# Patient Record
Sex: Female | Born: 1955
Health system: Southern US, Community
[De-identification: ages and names within clinical notes are randomized; demographics above are authoritative.]

## PROBLEM LIST (undated history)

## (undated) DIAGNOSIS — J45909 Unspecified asthma, uncomplicated: Secondary | ICD-10-CM

## (undated) DIAGNOSIS — E785 Hyperlipidemia, unspecified: Secondary | ICD-10-CM

## (undated) DIAGNOSIS — M3500 Sicca syndrome, unspecified: Secondary | ICD-10-CM

## (undated) DIAGNOSIS — E119 Type 2 diabetes mellitus without complications: Secondary | ICD-10-CM

## (undated) DIAGNOSIS — D649 Anemia, unspecified: Secondary | ICD-10-CM

## (undated) DIAGNOSIS — J189 Pneumonia, unspecified organism: Secondary | ICD-10-CM

## (undated) DIAGNOSIS — I350 Nonrheumatic aortic (valve) stenosis: Secondary | ICD-10-CM

## (undated) DIAGNOSIS — I272 Pulmonary hypertension, unspecified: Secondary | ICD-10-CM

## (undated) DIAGNOSIS — R0600 Dyspnea, unspecified: Secondary | ICD-10-CM

## (undated) DIAGNOSIS — R011 Cardiac murmur, unspecified: Secondary | ICD-10-CM

## (undated) DIAGNOSIS — K219 Gastro-esophageal reflux disease without esophagitis: Secondary | ICD-10-CM

## (undated) DIAGNOSIS — F329 Major depressive disorder, single episode, unspecified: Secondary | ICD-10-CM

## (undated) DIAGNOSIS — M1990 Unspecified osteoarthritis, unspecified site: Secondary | ICD-10-CM

## (undated) DIAGNOSIS — Z9289 Personal history of other medical treatment: Secondary | ICD-10-CM

## (undated) DIAGNOSIS — D696 Thrombocytopenia, unspecified: Secondary | ICD-10-CM

## (undated) DIAGNOSIS — Z87442 Personal history of urinary calculi: Secondary | ICD-10-CM

## (undated) DIAGNOSIS — Z8719 Personal history of other diseases of the digestive system: Secondary | ICD-10-CM

## (undated) DIAGNOSIS — K7460 Unspecified cirrhosis of liver: Secondary | ICD-10-CM

## (undated) DIAGNOSIS — M797 Fibromyalgia: Secondary | ICD-10-CM

## (undated) DIAGNOSIS — K7581 Nonalcoholic steatohepatitis (NASH): Secondary | ICD-10-CM

## (undated) DIAGNOSIS — D72819 Decreased white blood cell count, unspecified: Secondary | ICD-10-CM

## (undated) DIAGNOSIS — F32A Depression, unspecified: Secondary | ICD-10-CM

## (undated) DIAGNOSIS — Z8679 Personal history of other diseases of the circulatory system: Secondary | ICD-10-CM

## (undated) DIAGNOSIS — G4733 Obstructive sleep apnea (adult) (pediatric): Secondary | ICD-10-CM

## (undated) HISTORY — DX: Fibromyalgia: M79.7

## (undated) HISTORY — DX: Unspecified osteoarthritis, unspecified site: M19.90

## (undated) HISTORY — DX: Hyperlipidemia, unspecified: E78.5

## (undated) HISTORY — DX: Unspecified cirrhosis of liver: K74.60

## (undated) HISTORY — DX: Obstructive sleep apnea (adult) (pediatric): G47.33

## (undated) HISTORY — PX: COLONOSCOPY WITH ESOPHAGOGASTRODUODENOSCOPY (EGD): SHX5779

## (undated) HISTORY — DX: Major depressive disorder, single episode, unspecified: F32.9

## (undated) HISTORY — DX: Nonalcoholic steatohepatitis (NASH): K75.81

## (undated) HISTORY — DX: Type 2 diabetes mellitus without complications: E11.9

## (undated) HISTORY — DX: Sjogren syndrome, unspecified: M35.00

## (undated) HISTORY — DX: Depression, unspecified: F32.A

## (undated) HISTORY — DX: Pulmonary hypertension, unspecified: I27.20

## (undated) HISTORY — PX: TRANSTHORACIC ECHOCARDIOGRAM: SHX275

---

## 1973-05-07 HISTORY — PX: TONSILLECTOMY: SUR1361

## 1997-08-28 ENCOUNTER — Ambulatory Visit: Admission: RE | Admit: 1997-08-28 | Discharge: 1997-08-28 | Payer: Self-pay | Admitting: Otolaryngology

## 1997-12-04 ENCOUNTER — Ambulatory Visit: Admission: RE | Admit: 1997-12-04 | Discharge: 1997-12-04 | Payer: Self-pay | Admitting: Otolaryngology

## 2001-04-07 ENCOUNTER — Encounter: Payer: Self-pay | Admitting: Urology

## 2001-04-10 ENCOUNTER — Inpatient Hospital Stay (HOSPITAL_COMMUNITY): Admission: RE | Admit: 2001-04-10 | Discharge: 2001-04-12 | Payer: Self-pay | Admitting: Urology

## 2001-04-10 HISTORY — PX: PUBOVAGINAL SLING: SHX1035

## 2001-09-15 ENCOUNTER — Other Ambulatory Visit: Admission: RE | Admit: 2001-09-15 | Discharge: 2001-09-15 | Payer: Self-pay | Admitting: Family Medicine

## 2002-12-29 ENCOUNTER — Emergency Department (HOSPITAL_COMMUNITY): Admission: EM | Admit: 2002-12-29 | Discharge: 2002-12-29 | Payer: Self-pay | Admitting: Emergency Medicine

## 2002-12-29 ENCOUNTER — Encounter: Payer: Self-pay | Admitting: Emergency Medicine

## 2004-11-21 ENCOUNTER — Other Ambulatory Visit: Admission: RE | Admit: 2004-11-21 | Discharge: 2004-11-21 | Payer: Self-pay | Admitting: Obstetrics and Gynecology

## 2006-12-17 ENCOUNTER — Ambulatory Visit (HOSPITAL_COMMUNITY): Admission: RE | Admit: 2006-12-17 | Discharge: 2006-12-17 | Payer: Self-pay | Admitting: Surgery

## 2006-12-23 ENCOUNTER — Ambulatory Visit (HOSPITAL_COMMUNITY): Admission: RE | Admit: 2006-12-23 | Discharge: 2006-12-23 | Payer: Self-pay | Admitting: Surgery

## 2007-01-15 ENCOUNTER — Encounter: Admission: RE | Admit: 2007-01-15 | Discharge: 2007-01-15 | Payer: Self-pay | Admitting: Surgery

## 2007-03-19 ENCOUNTER — Ambulatory Visit (HOSPITAL_COMMUNITY): Admission: RE | Admit: 2007-03-19 | Discharge: 2007-03-19 | Payer: Self-pay | Admitting: Surgery

## 2007-05-06 ENCOUNTER — Ambulatory Visit (HOSPITAL_COMMUNITY): Admission: RE | Admit: 2007-05-06 | Discharge: 2007-05-06 | Payer: Self-pay | Admitting: Surgery

## 2007-05-21 ENCOUNTER — Encounter: Admission: RE | Admit: 2007-05-21 | Discharge: 2007-06-24 | Payer: Self-pay | Admitting: Surgery

## 2007-06-26 ENCOUNTER — Ambulatory Visit (HOSPITAL_COMMUNITY): Admission: RE | Admit: 2007-06-26 | Discharge: 2007-06-26 | Payer: Self-pay | Admitting: Surgery

## 2007-06-26 ENCOUNTER — Encounter (INDEPENDENT_AMBULATORY_CARE_PROVIDER_SITE_OTHER): Payer: Self-pay | Admitting: Surgery

## 2007-10-20 ENCOUNTER — Encounter: Admission: RE | Admit: 2007-10-20 | Discharge: 2007-10-20 | Payer: Self-pay | Admitting: Surgery

## 2007-11-04 ENCOUNTER — Ambulatory Visit (HOSPITAL_COMMUNITY): Admission: RE | Admit: 2007-11-04 | Discharge: 2007-11-04 | Payer: Self-pay | Admitting: Surgery

## 2007-11-04 ENCOUNTER — Encounter (INDEPENDENT_AMBULATORY_CARE_PROVIDER_SITE_OTHER): Payer: Self-pay | Admitting: Surgery

## 2007-11-04 HISTORY — PX: OTHER SURGICAL HISTORY: SHX169

## 2007-11-17 ENCOUNTER — Encounter: Admission: RE | Admit: 2007-11-17 | Discharge: 2007-11-17 | Payer: Self-pay

## 2007-12-04 ENCOUNTER — Encounter: Admission: RE | Admit: 2007-12-04 | Discharge: 2007-12-04 | Payer: Self-pay | Admitting: Gastroenterology

## 2008-03-09 ENCOUNTER — Encounter: Admission: RE | Admit: 2008-03-09 | Discharge: 2008-04-21 | Payer: Self-pay | Admitting: Family Medicine

## 2008-10-01 ENCOUNTER — Encounter: Admission: RE | Admit: 2008-10-01 | Discharge: 2008-10-01 | Payer: Self-pay | Admitting: Gastroenterology

## 2009-09-29 ENCOUNTER — Emergency Department (HOSPITAL_COMMUNITY): Admission: EM | Admit: 2009-09-29 | Discharge: 2009-09-29 | Payer: Self-pay | Admitting: Emergency Medicine

## 2009-10-27 ENCOUNTER — Encounter: Admission: RE | Admit: 2009-10-27 | Discharge: 2009-10-27 | Payer: Self-pay | Admitting: Diagnostic Neuroimaging

## 2009-12-02 ENCOUNTER — Encounter: Admission: RE | Admit: 2009-12-02 | Discharge: 2009-12-02 | Payer: Self-pay | Admitting: Diagnostic Neuroimaging

## 2010-05-28 ENCOUNTER — Encounter: Payer: Self-pay | Admitting: Gastroenterology

## 2010-07-24 LAB — COMPREHENSIVE METABOLIC PANEL
ALT: 17 U/L (ref 0–35)
AST: 27 U/L (ref 0–37)
Albumin: 3.7 g/dL (ref 3.5–5.2)
Alkaline Phosphatase: 60 U/L (ref 39–117)
Calcium: 8.9 mg/dL (ref 8.4–10.5)
Chloride: 103 mEq/L (ref 96–112)
GFR calc Af Amer: >60 mL/min (ref >60)
Glucose, Bld: 99 mg/dL (ref 70–99)
Potassium: 4 mEq/L (ref 3.5–5.1)
Sodium: 137 mEq/L (ref 135–145)
Total Bilirubin: 0.7 mg/dL (ref 0.3–1.2)
Total Protein: 7.2 g/dL (ref 6.0–8.3)

## 2010-07-24 LAB — DIFFERENTIAL
Lymphs Abs: 1.3 10*3/uL (ref 0.7–4.0)
Monocytes Absolute: 0.3 10*3/uL (ref 0.1–1.0)

## 2010-07-24 LAB — GLUCOSE, CAPILLARY: Glucose-Capillary: 103 mg/dL — ABNORMAL HIGH (ref 70–99)

## 2010-07-24 LAB — CBC
RBC: 4.97 MIL/uL (ref 3.87–5.11)
RDW: 13.4 % (ref 11.5–15.5)
WBC: 3.7 10*3/uL — ABNORMAL LOW (ref 4.0–10.5)

## 2010-09-19 NOTE — Op Note (Signed)
Whitney [name], Whitney [name]               ACCOUNT NO.:  [id number]   MEDICAL RECORD NO.:  [id number]          PATIENT TYPE:  AMB   LOCATION:  ENDO                         FACILITY:  [facility]   PHYSICIAN:  Fenton Malling. Lucia Gaskins, M.D.  DATE OF BIRTH:  [birth year]   DATE OF PROCEDURE:  06/26/2007  DATE OF DISCHARGE:                               OPERATIVE REPORT   PREOPERATIVE DIAGNOSIS:  Morbid obesity, repetitive positive H. Pylori.   POSTOPERATIVE DIAGNOSIS:  Morbid obesity, repetitive positive H. Pylori.   PROCEDURE:  Esophagogastroduodenoscopy with biopsy of stomach.   SURGEON:  Fenton Malling. Lucia Gaskins, M.D.   ASSISTANT:  None.   ANESTHESIA:  25 mcg of Fentanyl, 4 mg of Versed.   COMPLICATIONS:  None.   INDICATIONS FOR PROCEDURE:  The patient is a 55 year old female who is  morbidly obese, a patient of Lennette Bihari L. Little, M.D., and interested in  our bariatric program.  She is interested in gastric bypass.  She has  repetitive positive H. Pylori tests despite treatment.  I think she has  had two postive tests and been treated with antibiotics twice. She is  now being endoscoped for two reasons, to document that she has no active  ulcer disease, and secondly for biopsy of her stomach wall.   The indications and potential complications were explained to the  patient.  Potential complications were primarily perforation and  bleeding.   DESCRIPTION OF PROCEDURE:  The patient was placed in supine position.  The back of her throat anesthetized with Cetacaine x4.  She was then  rolled in the left lateral decubitus position and a bite block was  placed.  She was monitored with EKG, pulse oximetry, blood pressure  cuff, and had 2 liters of nasal O2 flowing during the procedure.   A flexible Pentax endoscope was passed down the back of her throat, down  her esophagus, and into her stomach.  I advanced the scope into the  duodenum without difficulty.  Her first and second portions of duodenum  were  unremarkable.  There was no evidence of scarring or ulcer.  Her  pylorus was widely patent.  The antrum of her stomach was unremarkable.  She did have sort of a red splotchy changes in the mucosa that were  dotted.  Whether these were just normal cell changes versus some type of  mild gastritis was unclear.  I did biopsy of the stomach x2 and I did a  biopsy of the stomach for CLOtest.  I retroflexed the scope and looked  up in the gastroesophageal junction which was unremarkable.  The Z-line  was identified at 39 cm from the incisors.  The esophagus itself was  unremarkable.  The scope was then withdrawn.   The patient will be back in touch with our office regarding proceeding  or electing to set up her surgery.  I have no contraindication to  proceeding with Roux-Y gastric bypass, but again we will have repeat  biopsies at the time I talk to her in the office.      Fenton Malling. Lucia Gaskins, M.D.  Electronically Signed  DHN/MEDQ  D:  06/26/2007  T:  06/27/2007  Job:  [id number]   cc:   Lennette Bihari L. Little, M.D.  Fax: [phone]

## 2010-09-19 NOTE — Op Note (Signed)
NAME:  Whitney [name], Whitney [name]               ACCOUNT NO.:  [id number]   MEDICAL RECORD NO.:  [id number]          PATIENT TYPE:  INP   LOCATION:  0002                         FACILITY:  [facility]   PHYSICIAN:  Fenton Malling. Lucia Gaskins, M.D.  DATE OF BIRTH:  1955/09/04   DATE OF PROCEDURE:  11/04/2007  DATE OF DISCHARGE:                               OPERATIVE REPORT   Date of Surgery??   PREOPERATIVE DIAGNOSIS:  Morbid obesity, weight of 267, BMI 47.5.   POSTOPERATIVE DIAGNOSIS:  Morbid obesity, weight of 267, BMI 47.5.  Nodular liver consistent with possible cirrhosis.   PROCEDURE:  Laparoscopic exploration with core biopsies of left and  right lobe of the liver.   SURGEON:  Fenton Malling. Lucia Gaskins, M.D.   ASSISTANT:  Isabel Caprice. Hassell Done, M.D.   ANESTHESIA:  General endotracheal.   ESTIMATED BLOOD LOSS:  Minimal.   INDICATIONS FOR PROCEDURE:  [name] is a 55 year old white female who  is a patient of Lennette Bihari L. Little, M.D. and sees Michael Litter, M.D. from a  rheumatology standpoint for Sjogren's syndrome.  She has been overweight  much of her adult life.  She is interested in bariatric surgery and  interested in Roux gastric bypass.  She has been through our  preoperative evaluation.   The indications and potential complications of the procedure were  explained to the patient.  Potential complications include but are not  limited to bleeding, infection, bowel leak, blood clots to her lungs,  and longterm nutritional consequences.   DESCRIPTION OF PROCEDURE:  The patient was placed in the supine position  with her abdomen prepped with Techni-Care.  She was given 2 grams of  cefoxitin at the initiation of the procedure.  Foley catheter was  placed.  PAS stockings were in place.  Timeout was held to identify the  patient and procedure.  She was sterilely draped.   I accessed her abdominal cavity through the left upper quadrant with a  12 mm Ethicon trocar with OptiVu and insufflated the abdomen.  I  looked  around at the intra-abdominal.  Unfortunately, her liver had a nodular  appearance consistent with a moderate cirrhosis.  Her preoperative labs  had shown a mildly elevated SGOT to 55, but her SGPT, bilirubin, albumin  had all been normal.   I saw no other evidence of tumor or mass.  Her stomach was unremarkable.  The bowel that I could see was unremarkable.  The omentum was  unremarkable.  I felt uncomfortable going ahead with her bariatric  surgery with previously undiagnosed liver disease.   I thought she would be best served biopsying her liver.  I used a Tru-  Cut needle and took two biopsies of the right lobe of the liver, one  biopsy of the left lobe of the liver.  I did put an additional 5 mm port  in her right upper quadrant.  I irrigated these off.  There was no  bleeding.  I then pulled out the trocars.  I closed the skin with 5-0  Monocryl suture.  I painted the wound with tincture Benzoin and  Steri-  Strips.   The patient tolerated the procedure well and there was no significant  blood loss.  Needle, sponge, and instrument counts correct at the end of  the case.  We did not do bypass and just did bilateral liver biopsies  and laparoscopic exploration.      Fenton Malling. Lucia Gaskins, M.D.  Electronically Signed     DHN/MEDQ  D:  11/04/2007  T:  11/04/2007  Job:  [id number]   cc:   Lennette Bihari L. Little, M.D.  Fax: [phone]   Michael Litter, M.D.  Fax: Carrier Amedeo Plenty, M.D.  Fax: [phone]

## 2010-09-22 NOTE — Discharge Summary (Signed)
[facility]  Patient:    Whitney [name], Whitney [name] Visit Number: [id number] MRN: [id number]          Service Type: MED Location: 3W [id number] Attending Physician:  Harvest Dark Dictated by:   Synthia Innocent, M.D. Admit Date:  04/10/2001 Discharge Date: 04/12/2001                             Discharge Summary  HISTORY:  This is a 55 year old lady with urinary stress incontinence and significant leakage problems.  We have been seeing her now in the office for some time and trying to manage her conservatively without any success.  She had a positive stress test in our office.  She also was worked up with urodynamics.  She has multiple medical problems including Sjogrens syndrome with a chronic cough, recently diagnosed diabetes, and exogenous obesity.  I recommended that she lose some weight prior to any surgical intervention. However, she sent me a letter recently stating that she felt her condition was to the point now where she wears too many pads, it is affecting her work and social activity, and asked me to go ahead with a pubovaginal sling procedure which we discussed in the office.  At this point, I said I would proceed with surgery so she was brought in as an a.m. admission on April 10, 2001, and underwent a pubovaginal sling procedure using a SPARC sling.  Postoperatively, I left a Foley catheter in and she was sent home on the second postoperative day, doing well.  She has a history of rheumatic fever and we covered her with gentamicin and ampicillin.  Postoperatively, she had uneventful postoperative course, remained afebrile, catheter was draining well.  We controlled her cough nicely with Tussionex and also Tessalon Perles.  LABORATORY:  Pertinent laboratory data, her creatinine was 0.6, her hemoglobin was 12.1, hematocrit 36.3, white cell count was 4300.  Electrolytes were normal with a BUN of 6, a creatinine of 0.6.  Her AST was 60, ALT 36,  ALP 67, and total bilirubin 0.9.  Her urine was clear.  Chest x-ray showed negative for active disease.  EKG was unremarkable.  FINAL DIAGNOSES: 1. Stress urinary incontinence. 2. Exogenous obesity. 3. Sjogrens syndrome. 4. History of rheumatic fever.  OPERATION:  Pubovaginal sling [facility]) and cystoscopy.  POSTOPERATIVE COMPLICATIONS:  None.  CONDITION ON DISCHARGE:  Recovering.  DISCHARGE MEDICATIONS: 1. Tylenol No. 3 for pain, #25. 2. Cipro 250 one b.i.d., #12, for antibiotic coverage.  She also is to renew all of her home medications including Bicillin monthly for rheumatic fever, Lasix p.r.n. swelling, iron 2 a day, Salagen 5 mg 1 q.i.d. p.r.n., and benzonatate 100 mg for cough 2 t.i.d. p.r.n., Tussionex cough medicine p.r.n.  DISPOSITION:  Regular diet.  Limited activity for six weeks.  She is to return to see myself in the office in three days for catheter removal and a trial of voiding. Dictated by:   Synthia Innocent, M.D. Attending Physician:  Harvest Dark DD:  04/12/01 TD:  04/12/01 Job: [id number]

## 2010-09-22 NOTE — Op Note (Signed)
[facility]  Patient:    Whitney [name], Whitney [name] Visit Number: [id number] MRN: [id number]          Service Type: MED Location: 0I [id number] Attending Physician:  Harvest Dark Proc. Date: 04/10/01 Admit Date:  04/10/2001                             Operative Report  PREOPERATIVE DIAGNOSIS:  Stress urinary incontinence.  POSTOPERATIVE DIAGNOSIS:  Stress urinary incontinence.  PROCEDURE PERFORMED:  Pubovaginal sling procedure (Sparc) and cystoscopy.  SURGEON:  Synthia Innocent, M.D.  ANESTHESIA:  General.  DESCRIPTION OF PROCEDURE:  The patient was prepped in the dorsolithotomy position under intubated general anesthesia.  A Foley catheter was placed.  A vaginal speculum was put in place.  A midline incision was outlined with a marking pen over the pubic area and also in the vaginal area between the bladder neck and the urethral meatus.  A vertical incision was made in the vaginal wall and then dissection was carried out to the inferior ramus bilaterally creating a pouch for the proposed sling.  We then made two puncture wounds about 1-2 cm just off the midline just above the pubic bone. Then using the Sparc needles, the needles were passed bilaterally under fingertip control out the vaginal incision.  Cystoscopy was then performed confirming there was no inadvertent puncture of the bladder.  We then attached the Sparc sling which was a polypropylene mesh.  This was brought up suprapubically.  Care was taken not to tighten the urethra.  We had a right angle clamp around it as we removed the plastic enveloping sheath.  I made sure that there was some space between the urethra and the sling.  We then irrigated the wounds with copious amounts of bug juice.  We cut the sling just below the skin suprapubically then closed the vaginal incision with a running 2-0 Vicryl suture and then packed the vaginal area with iodoform vaginal gauze.  The Foley catheter  was hooked to a straight drain.  It should be noted that prior to closure, I recystoscoped her after injecting indigo carmine and there appeared to be efflux from each ureteral orifice.  Dermabond was applied to the puncture wound suprapubically and the patient was taken back to the recovery room in satisfactory condition. Attending Physician:  Harvest Dark DD:  04/10/01 TD:  04/10/01 Job: [id number]

## 2011-02-01 LAB — PROTIME-INR
INR: 1.1
Prothrombin Time: 14.7

## 2011-02-01 LAB — IRON AND TIBC
Iron: 58
TIBC: 335

## 2011-02-01 LAB — HEPATITIS C ANTIBODY: HCV Ab: NEGATIVE

## 2011-02-01 LAB — HEPATITIS B CORE ANTIBODY, TOTAL: Hep B Core Total Ab: NEGATIVE

## 2011-02-01 LAB — BASIC METABOLIC PANEL: GFR calc non Af Amer: >60

## 2011-03-12 ENCOUNTER — Institutional Professional Consult (permissible substitution): Payer: Self-pay | Admitting: Pulmonary Disease

## 2011-03-15 ENCOUNTER — Encounter: Payer: Self-pay | Admitting: Pulmonary Disease

## 2011-03-16 ENCOUNTER — Institutional Professional Consult (permissible substitution): Payer: Self-pay | Admitting: Pulmonary Disease

## 2011-04-10 ENCOUNTER — Encounter: Payer: Self-pay | Admitting: Pulmonary Disease

## 2011-04-11 ENCOUNTER — Encounter: Payer: Self-pay | Admitting: Pulmonary Disease

## 2011-04-11 ENCOUNTER — Ambulatory Visit (INDEPENDENT_AMBULATORY_CARE_PROVIDER_SITE_OTHER): Payer: BC Managed Care – PPO | Admitting: Pulmonary Disease

## 2011-04-11 VITALS — BP 148/98 | HR 89 | Temp 98.4°F | Ht 63.0 in | Wt 266.8 lb

## 2011-04-11 DIAGNOSIS — R053 Chronic cough: Secondary | ICD-10-CM | POA: Insufficient documentation

## 2011-04-11 DIAGNOSIS — R05 Cough: Secondary | ICD-10-CM | POA: Insufficient documentation

## 2011-04-11 DIAGNOSIS — R059 Cough, unspecified: Secondary | ICD-10-CM

## 2011-04-11 MED ORDER — TRAMADOL HCL 50 MG PO TABS: 50.0000 mg | ORAL_TABLET | Freq: Three times a day (TID) | ORAL | Status: DC

## 2011-04-11 MED ORDER — DEXLANSOPRAZOLE 60 MG PO CPDR: 60.0000 mg | DELAYED_RELEASE_CAPSULE | Freq: Two times a day (BID) | ORAL | Status: DC

## 2011-04-11 NOTE — Assessment & Plan Note (Signed)
The patient has had a chronic cough for over 10 years in duration.  Her symptoms continued to sound upper airway in origin, although I cannot exclude an issue with her Sjogren's or the possibility of non-asthmatic eosinophilic bronchitis.  I have discussed with her the management of chronic cough, and how we have to be very disciplined with respect to the treatment algorithm.  Will start out by treating her more aggressively for postnasal drip and reflux, and would like to try tramadol for cough suppression.  Also stressed to her the importance of behavioral therapy to prevent cyclical coughing.  If she continues to have issues, will do followup spirometry, we'll have an otolaryngology do an upper airway exam, and at some point may need bronchoscopy for further evaluation.  Would also consider a trial of Neurontin for neurogenic cough.

## 2011-04-11 NOTE — Progress Notes (Signed)
  Subjective:    Patient ID: Whitney [name], female    DOB: 30-Jul-1955, 55 y.o.   MRN: [id number]  HPI The patient is a 55 year old female who I have been asked to see for chronic cough.  The patient was initially seen many years ago, and was felt to have primarily an upper airway cough, possibly secondary to reflux disease and postnasal drip.  She was also diagnosed with Sjogren's by rheumatology, and there was some concern that her cough may be due to desiccation of her airways.  The patient did have a methacholine challenge test that was really unremarkable for asthma.  She was treated for cyclical coughing, as well as possible LPR and postnasal drip.  She did have some improvement in her cough, but she has not been seen since 2002.  The patient states that her cough never totally went away.  The severity of her cough has been up and down over the years, and currently she feels it is a 7/10.  Her cough is dry and hacky in nature, and she does feel a tickle in her throat.  She admits to frequent throat clearing, and her cough is worse with talking and strong odors.  The patient also has cough paroxysms at times.  Unfortunately, she has to talk constantly as a school principal.  The patient has no sinus pressure but she does feel a fullness in her ears.  She does have some postnasal drip.  She denies reflux symptoms, and has never had an upper airway exam by otolaryngology.  She apparently has had a recent chest x-ray, and we will try and obtain this.  She has not had recent spirometry.   Review of Systems  Constitutional: Negative for fever and unexpected weight change.  HENT: Positive for sneezing. Negative for ear pain, nosebleeds, congestion, sore throat, rhinorrhea, trouble swallowing, dental problem, postnasal drip and sinus pressure.   Eyes: Negative for redness and itching.  Respiratory: Positive for cough and shortness of breath. Negative for chest tightness and wheezing.   Cardiovascular:  Positive for palpitations and leg swelling.  Gastrointestinal: Negative for nausea and vomiting.  Genitourinary: Negative for dysuria.  Musculoskeletal: Negative for joint swelling.  Skin: Negative for rash.  Neurological: Negative for headaches.  Hematological: Does not bruise/bleed easily.  Psychiatric/Behavioral: Negative for dysphoric mood. The patient is not nervous/anxious.        Objective:   Physical Exam Constitutional:  Obese female, no acute distress  HENT:  Nares patent without discharge  Oropharynx without exudate, palate and uvula are elongated.  Eyes:  Perrla, eomi, no scleral icterus  Neck:  No JVD, no TMG  Cardiovascular:  Normal rate, regular rhythm, no rubs or gallops.  3/6 sem        Intact distal pulses  Pulmonary :  Normal breath sounds, no stridor or respiratory distress   No rales, rhonchi, or wheezing  Abdominal:  Soft, nondistended, bowel sounds present.  No tenderness noted.   Musculoskeletal:  mild lower extremity edema noted.  Lymph Nodes:  No cervical lymphadenopathy noted  Skin:  No cyanosis noted  Neurologic:  Alert, appropriate, moves all 4 extremities without obvious deficit.         Assessment & Plan:

## 2011-04-11 NOTE — Patient Instructions (Signed)
Take dexilant 21m one in am and pm for possible reflux Take chlorpheniramine 436m take 2 at bedtime and one at lunch everyday (over the counter) Tramadol 5077mhree times a day as directed Limit voice use as much as possible, no yelling or singing. No throat clearing, use hard candy (no menthol or mint) all day long to bathe back of throat while awake. Will have last cxr report from Dr. LitRex Krasnt over for my review. followup with me in 3 weeks.

## 2011-04-16 ENCOUNTER — Encounter: Payer: Self-pay | Admitting: Pulmonary Disease

## 2011-04-20 ENCOUNTER — Telehealth: Payer: Self-pay | Admitting: *Deleted

## 2011-04-20 MED ORDER — OMEPRAZOLE 20 MG PO CPDR: 40.0000 mg | DELAYED_RELEASE_CAPSULE | Freq: Two times a day (BID) | ORAL | Status: DC

## 2011-04-20 NOTE — Telephone Encounter (Signed)
[facility] [place] to initiate PA for pt's dexilant 60 mg capsules. I called [phone] and was advised pt must first try and fail omeprazole 20 g capsules, Prilosec OTC, or nexium 20 mg. Please advise Dr. Gwenette Greet thanks  Allergies  Allergen Reactions  . Doxycycline     (rosacea) hives   . Naprosyn (Naproxen)     rash

## 2011-04-20 NOTE — Telephone Encounter (Signed)
The patient is aware of new rx for omeprazole and this has been sent to her pharmacy. She will call if having any problems on the new medication.

## 2011-04-20 NOTE — Telephone Encounter (Signed)
Lets try omeprazole 66m capsules  2 each am and pm before meals

## 2011-04-27 ENCOUNTER — Encounter: Payer: Self-pay | Admitting: Pulmonary Disease

## 2011-04-27 ENCOUNTER — Ambulatory Visit (INDEPENDENT_AMBULATORY_CARE_PROVIDER_SITE_OTHER): Payer: BC Managed Care – PPO | Admitting: Pulmonary Disease

## 2011-04-27 VITALS — BP 140/90 | HR 103 | Temp 98.8°F | Ht 63.0 in | Wt 260.0 lb

## 2011-04-27 DIAGNOSIS — R05 Cough: Secondary | ICD-10-CM

## 2011-04-27 NOTE — Assessment & Plan Note (Signed)
The patient has had significant improvement in her upper airway cough with changes from the last visit, but now has developed a superimposed URI that most likely is viral in nature.  I have asked her to try new meds sinus rinses, and will continue on the behavioral therapies for chronic upper airway cough.  She is having dryness issues from the chlorpheniramine do to her Sjogren's, and we'll try her on Zyrtec in its place.  I think she needs to continue treatment for reflux for at least another 4 weeks, and needs to try and decrease her use of tramadol.  She will call us in approximately 4 weeks with her progress.

## 2011-04-27 NOTE — Patient Instructions (Signed)
Stay on omeprazole for possible occult reflux am and pm for next 4 weeks. ( 2 of the 75m tabs in am and pm) Change chlorpheniramine to zyrtec 142mat bedtime, but if you feel postnasal drip or cough is worsening, go back to chlorpheniramine 55m70mt bedtime only ( hold off with dose during day) Ok to use tramadol if cough is at a high level, but try to back off and using as needed only Continue to limit voice use if you can for next 4 weeks Continue with hard candy during day to prevent tickle in throat Try neilmed sinus rinses am and pm for current symptoms.  If you think this is worsening, give us [place]acall. Please call us [place] 3-4 weeks to let us [place] how things are going.

## 2011-04-27 NOTE — Progress Notes (Signed)
  Subjective:    Patient ID: Whitney [name], female    DOB: May 13, 1955, 55 y.o.   MRN: [id number]  HPI The patient comes in today for followup of her chronic cough, felt to be upper airway in origin.  At the last visit, she was treated more aggressively for laryngopharyngeal reflux, postnasal drip, and also cyclical coughing.  The patient comes in today where her cough was significantly improved by at least 70-80%, but she then developed what sounds like an upper respiratory infection, and now has a "different cough".  She denies any significant purulence or sinus pressure currently.     Review of Systems  Constitutional: Negative for fever and unexpected weight change.  HENT: Positive for congestion and sinus pressure. Negative for ear pain, nosebleeds, sore throat, rhinorrhea, sneezing, trouble swallowing, dental problem and postnasal drip.   Eyes: Negative for redness and itching.  Respiratory: Positive for cough. Negative for chest tightness, shortness of breath and wheezing.   Cardiovascular: Negative for palpitations and leg swelling.  Gastrointestinal: Negative for nausea and vomiting.  Genitourinary: Negative for dysuria.  Musculoskeletal: Negative for joint swelling.  Skin: Negative for rash.  Neurological: Negative for headaches.  Hematological: Does not bruise/bleed easily.  Psychiatric/Behavioral: Negative for dysphoric mood. The patient is not nervous/anxious.        Objective:   Physical Exam Obese female in no acute distress Nose with no purulence or discharge noted Chest totally clear to auscultation Heart exam with regular rate and rhythm Lower extremities without edema, no cyanosis noted Alert and oriented, moves all 4 extremities.        Assessment & Plan:

## 2011-06-15 ENCOUNTER — Other Ambulatory Visit: Payer: Self-pay | Admitting: Pulmonary Disease

## 2011-06-22 ENCOUNTER — Telehealth: Payer: Self-pay | Admitting: Pulmonary Disease

## 2011-06-22 MED ORDER — TRAMADOL HCL 50 MG PO TABS: 50.0000 mg | ORAL_TABLET | Freq: Four times a day (QID) | ORAL | Status: AC | PRN

## 2011-06-22 NOTE — Telephone Encounter (Signed)
Called, spoke with pt. She was seen on 04/27/11 and was told to call in 3-4 wks with a report. She states there was a 2 - 2 1/2 wk period where she was only coughing "here and there" while she was on the tramadol.  She states she could deal with this cough.  But since this period, cough has gotten "progressively worse."  She states it is "overwhelming constant to the point I can't breath."  She is taking chlorpheniramine tid, using hard candy, and water.  She is requesting further recs from this point.  Dr. Gwenette Greet, please advise.  Thanks!

## 2011-06-22 NOTE — Telephone Encounter (Signed)
Part of the issue here is that she has to use her voice constantly in her job. Make sure she is still taking med for acid reflux twice a day, stay on chlorpheniramine 38m twice a day, and continue to limit voice use as much as she can. Is she still taking tramadol?  If not can call in script for [id number] as needed.  (#30, one fill) She needs ov with me next week to discuss all of this and further investigation.

## 2011-06-22 NOTE — Telephone Encounter (Signed)
I spoke with pt and is aware of [place] recs. She voiced her understanding. She states she does not have any tramadol left and would like the [facility] [place]. Pt stated she can't come in for OV until 07/02/11. [facility] had an opening at 9:15 so I scheduled pt then. Pt needed nothing further

## 2011-07-02 ENCOUNTER — Encounter: Payer: Self-pay | Admitting: Pulmonary Disease

## 2011-07-02 ENCOUNTER — Ambulatory Visit (INDEPENDENT_AMBULATORY_CARE_PROVIDER_SITE_OTHER): Payer: BC Managed Care – PPO | Admitting: Pulmonary Disease

## 2011-07-02 VITALS — BP 138/82 | HR 99 | Temp 98.8°F | Ht 63.0 in | Wt 262.0 lb

## 2011-07-02 DIAGNOSIS — R05 Cough: Secondary | ICD-10-CM

## 2011-07-02 NOTE — Patient Instructions (Signed)
Continue with behavioral therapies for cyclical coughing as we discussed. Take antihistamine as needed for postnasal drip Try to avoid throat clearing and overuse of voice as much as possible. Ok to use tramadol as needed for cough escalation, but call if it is getting out of hand. followup with me late summer to check on your progress.

## 2011-07-02 NOTE — Progress Notes (Signed)
  Subjective:    Patient ID: Whitney [name], female    DOB: 1955-12-31, 56 y.o.   MRN: [id number]  HPI The patient comes in today for followup of chronic cough.  She has a hyper sensitized upper airway probably related to Sjogren's, with possible irritation from postnasal drip and reflux disease.  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  She feels that her cough is greatly improved from the last visit, but does notice the cough can escalate at any given time.  The tramadol definitely helps her cough significantly, but she only uses it when her cough becomes a major issue.   Review of Systems  Constitutional: Negative for fever and unexpected weight change.  HENT: Positive for congestion, sneezing, trouble swallowing and postnasal drip. Negative for ear pain, nosebleeds, sore throat, rhinorrhea, dental problem and sinus pressure.   Eyes: Negative for redness and itching.  Respiratory: Positive for cough and wheezing. Negative for chest tightness and shortness of breath.   Cardiovascular: Negative for palpitations and leg swelling.  Gastrointestinal: Negative for nausea and vomiting.  Genitourinary: Negative for dysuria.  Musculoskeletal: Negative for joint swelling.  Skin: Negative for rash.  Neurological: Negative for headaches.  Hematological: Does not bruise/bleed easily.  Psychiatric/Behavioral: Negative for dysphoric mood. The patient is not nervous/anxious.        Objective:   Physical Exam Obese female in no acute distress Nose without discharge or purulence noted Lower extremities without edema, no cyanosis Alert and oriented, moves all 4 extremities.      Assessment & Plan:

## 2011-07-02 NOTE — Assessment & Plan Note (Signed)
The patient has a chronic cough I suspect is related to upper airway dysfunction syndrome.  This is complicated by postnasal drip, LPR, and over use of voice.  Significant dryness also is a contributor because of her Sjogren's.  She feels the tramadol helps significantly when her cough escalates, and I am okay with this as long as she minimizes its use.  I also stressed to her the importance of continuing the behavioral therapies that we have discussed that multiple visits.

## 2011-12-21 ENCOUNTER — Ambulatory Visit: Payer: BC Managed Care – PPO | Admitting: Pulmonary Disease

## 2012-08-06 DIAGNOSIS — K7581 Nonalcoholic steatohepatitis (NASH): Secondary | ICD-10-CM | POA: Diagnosis present

## 2012-10-28 ENCOUNTER — Other Ambulatory Visit: Payer: Self-pay | Admitting: Family Medicine

## 2012-10-28 ENCOUNTER — Other Ambulatory Visit (HOSPITAL_COMMUNITY)
Admission: RE | Admit: 2012-10-28 | Discharge: 2012-10-28 | Disposition: A | Discharge status: Home / Self Care | Payer: BC Managed Care – PPO | Source: Ambulatory Visit | Attending: Family Medicine | Admitting: Family Medicine

## 2012-10-28 DIAGNOSIS — Z124 Encounter for screening for malignant neoplasm of cervix: Secondary | ICD-10-CM | POA: Insufficient documentation

## 2012-10-29 ENCOUNTER — Other Ambulatory Visit: Payer: Self-pay | Admitting: Family Medicine

## 2012-10-29 DIAGNOSIS — N939 Abnormal uterine and vaginal bleeding, unspecified: Secondary | ICD-10-CM

## 2012-10-30 ENCOUNTER — Other Ambulatory Visit: Payer: Self-pay | Admitting: Gastroenterology

## 2012-10-30 DIAGNOSIS — K7460 Unspecified cirrhosis of liver: Secondary | ICD-10-CM

## 2012-10-31 ENCOUNTER — Ambulatory Visit
Admission: RE | Admit: 2012-10-31 | Discharge: 2012-10-31 | Disposition: A | Discharge status: Home / Self Care | Payer: BC Managed Care – PPO | Source: Ambulatory Visit | Attending: Family Medicine | Admitting: Family Medicine

## 2012-10-31 ENCOUNTER — Ambulatory Visit
Admission: RE | Admit: 2012-10-31 | Discharge: 2012-10-31 | Disposition: A | Discharge status: Home / Self Care | Payer: BC Managed Care – PPO | Source: Ambulatory Visit | Attending: Gastroenterology | Admitting: Gastroenterology

## 2012-10-31 DIAGNOSIS — N939 Abnormal uterine and vaginal bleeding, unspecified: Secondary | ICD-10-CM

## 2012-10-31 DIAGNOSIS — K7460 Unspecified cirrhosis of liver: Secondary | ICD-10-CM

## 2012-10-31 MED ORDER — IOHEXOL 350 MG/ML SOLN
125.0000 mL | Freq: Once | INTRAVENOUS | Status: AC | PRN
  Administered 2012-10-31: 125 mL via INTRAVENOUS

## 2012-12-03 ENCOUNTER — Encounter (HOSPITAL_COMMUNITY): Payer: Self-pay

## 2012-12-03 ENCOUNTER — Encounter (HOSPITAL_COMMUNITY)
Admission: RE | Admit: 2012-12-03 | Discharge: 2012-12-03 | Disposition: A | Discharge status: Home / Self Care | Payer: BC Managed Care – PPO | Source: Ambulatory Visit | Attending: Obstetrics and Gynecology | Admitting: Obstetrics and Gynecology

## 2012-12-03 DIAGNOSIS — Z01818 Encounter for other preprocedural examination: Secondary | ICD-10-CM | POA: Insufficient documentation

## 2012-12-03 DIAGNOSIS — Z01812 Encounter for preprocedural laboratory examination: Secondary | ICD-10-CM | POA: Insufficient documentation

## 2012-12-03 LAB — COMPREHENSIVE METABOLIC PANEL
ALT: 35 U/L (ref 0–35)
Albumin: 3.1 g/dL — ABNORMAL LOW (ref 3.5–5.2)
Alkaline Phosphatase: 83 U/L (ref 39–117)
Chloride: 102 mEq/L (ref 96–112)
GFR calc Af Amer: >90 mL/min (ref >90)
Glucose, Bld: 119 mg/dL — ABNORMAL HIGH (ref 70–99)
Potassium: 3.8 mEq/L (ref 3.5–5.1)
Sodium: 137 mEq/L (ref 135–145)
Total Bilirubin: 1.3 mg/dL — ABNORMAL HIGH (ref 0.3–1.2)
Total Protein: 6.4 g/dL (ref 6.0–8.3)

## 2012-12-03 LAB — APTT: aPTT: 35 seconds (ref 24–37)

## 2012-12-03 LAB — PROTIME-INR
INR: 1.08 (ref 0.00–1.49)
Prothrombin Time: 13.8 seconds (ref 11.6–15.2)

## 2012-12-03 NOTE — Patient Instructions (Addendum)
[place]  12/03/2012   Your procedure is scheduled on:  12/11/12  Enter through the Main Entrance of [facility] [place] up the phone at the desk and dial [phone].   Call this number if you have problems the morning of surgery: [phone]   Remember:   Do not eat food:After Midnight.  Do not drink clear liquids: 4 Hours before arrival.  Take these medicines the morning of surgery with A SIP OF WATER: Hold Mettformin 24hrs prior  To surgery   Do not wear jewelry, make-up or nail polish.  Do not wear lotions, powders, or perfumes. You may wear deodorant.  Do not shave 48 hours prior to surgery.  Do not bring valuables to the hospital.  [facility] is not responsible                  for any belongings or valuables brought to the hospital.  Contacts, dentures or bridgework may not be worn into surgery.  Leave suitcase in the car. After surgery it may be brought to your room.  For patients admitted to the hospital, checkout time is 11:00 AM the day of                discharge.   Patients discharged the day of surgery will not be allowed to drive                   home.  Name and phone number of your driver: NA  Special Instructions: Shower using CHG 2 nights before surgery and the night before surgery.  If you shower the day of surgery use CHG.  Use special wash - you have one bottle of CHG for all showers.  You should use approximately 1/3 of the bottle for each shower.   Please read over the following fact sheets that you were given: Surgical Site Infection Prevention

## 2012-12-03 NOTE — Anesthesia Preprocedure Evaluation (Addendum)
Anesthesia Evaluation  Patient identified by MRN, date of birth, ID band Patient awake    Reviewed: Allergy & Precautions, H&P , NPO status , Patient's Chart, lab work & pertinent test results, reviewed documented beta blocker date and time   History of Anesthesia Complications Negative for: history of anesthetic complications  Airway Mallampati: III TM Distance: >3 FB Neck ROM: Full    Dental no notable dental hx. (+) Teeth Intact   Pulmonary neg pulmonary ROS, asthma (no inhaler use in years) , sleep apnea ,  Scheduled for home sleep study tomorrow breath sounds clear to auscultation  Pulmonary exam normal       Cardiovascular + Valvular Problems/Murmurs (Dr Irish Lack yearly, h/o rheumatic fever) MVP Rhythm:Regular Rate:Normal + Systolic murmurs    Neuro/Psych PSYCHIATRIC DISORDERS Depression    GI/Hepatic negative GI ROS, (+) Cirrhosis -       , Hepatitis -, UnspecifiedNASH- non alcoholic steatohepatitis   Endo/Other  diabetes, Well Controlled, Type 2, Oral Hypoglycemic AgentsMorbid obesityHyperlipidemia  Renal/GU negative Renal ROS     Musculoskeletal  (+) Arthritis - (ankles, fingers), Fibromyalgia - (no meds, mainly right hip, elbows)  Abdominal (+) + obese,   Peds  Hematology negative hematology ROS (+) Sjogren's Syndrome   Anesthesia Other Findings Careful positioning right hip  Reproductive/Obstetrics negative OB ROS                         Anesthesia Physical Anesthesia Plan  ASA: III  Anesthesia Plan: General LMA   Post-op Pain Management:    Induction:   Airway Management Planned:   Additional Equipment:   Intra-op Plan:   Post-operative Plan:   Informed Consent: I have reviewed the patients History and Physical, chart, labs and discussed the procedure including the risks, benefits and alternatives for the proposed anesthesia with the patient or authorized  representative who has indicated his/her understanding and acceptance.   Dental Advisory Given  Plan Discussed with: Anesthesiologist, Surgeon and CRNA  Anesthesia Plan Comments: (Discussed Anesthesia with patient. Will obtain CMET and coagulation studies. Patient is on investigational drug for NASH at [facility]. She is going to contact her GI specialist at [facility] and inform them of her upcoming surgery so they can advise if there are any possible interactions with anesthesia. Depending on results of her laboratory findings will be able to better recommend the type of anesthetic. Discussed both regional and general anesthesia with patient.  Labs reviewed. She could undergo either GA or Regional depending on her conversation with her GI specialist.)       Anesthesia Quick Evaluation

## 2012-12-04 LAB — CBC
Hemoglobin: 13.4 g/dL (ref 12.0–15.0)
MCHC: 32.8 g/dL (ref 30.0–36.0)
WBC: 2.5 10*3/uL — ABNORMAL LOW (ref 4.0–10.5)

## 2012-12-10 ENCOUNTER — Other Ambulatory Visit: Payer: Self-pay | Admitting: Obstetrics and Gynecology

## 2012-12-11 ENCOUNTER — Encounter (HOSPITAL_COMMUNITY): Payer: Self-pay | Admitting: Anesthesiology

## 2012-12-11 ENCOUNTER — Ambulatory Visit (HOSPITAL_COMMUNITY): Payer: BC Managed Care – PPO | Admitting: Anesthesiology

## 2012-12-11 ENCOUNTER — Ambulatory Visit (HOSPITAL_COMMUNITY)
Admission: RE | Admit: 2012-12-11 | Discharge: 2012-12-11 | Disposition: A | Discharge status: Home / Self Care | Payer: BC Managed Care – PPO | Source: Ambulatory Visit | Attending: Obstetrics and Gynecology | Admitting: Obstetrics and Gynecology

## 2012-12-11 ENCOUNTER — Encounter (HOSPITAL_COMMUNITY): Payer: Self-pay | Admitting: *Deleted

## 2012-12-11 ENCOUNTER — Encounter (HOSPITAL_COMMUNITY)
Admission: RE | Disposition: A | Discharge status: Home / Self Care | Payer: Self-pay | Source: Ambulatory Visit | Attending: Obstetrics and Gynecology

## 2012-12-11 DIAGNOSIS — N950 Postmenopausal bleeding: Secondary | ICD-10-CM | POA: Diagnosis present

## 2012-12-11 DIAGNOSIS — R05 Cough: Secondary | ICD-10-CM

## 2012-12-11 DIAGNOSIS — G4733 Obstructive sleep apnea (adult) (pediatric): Secondary | ICD-10-CM | POA: Insufficient documentation

## 2012-12-11 DIAGNOSIS — E119 Type 2 diabetes mellitus without complications: Secondary | ICD-10-CM | POA: Insufficient documentation

## 2012-12-11 DIAGNOSIS — N840 Polyp of corpus uteri: Secondary | ICD-10-CM | POA: Diagnosis present

## 2012-12-11 HISTORY — PX: HYSTEROSCOPY W/D&C: SHX1775

## 2012-12-11 LAB — CBC
HCT: 41.6 % (ref 36.0–46.0)
MCV: 88.9 fL (ref 78.0–100.0)
RBC: 4.68 MIL/uL (ref 3.87–5.11)
WBC: 2.8 10*3/uL — ABNORMAL LOW (ref 4.0–10.5)

## 2012-12-11 LAB — GLUCOSE, CAPILLARY: Glucose-Capillary: 108 mg/dL — ABNORMAL HIGH (ref 70–99)

## 2012-12-11 SURGERY — DILATATION AND CURETTAGE /HYSTEROSCOPY
Anesthesia: General | Site: Vagina | Wound class: Clean Contaminated

## 2012-12-11 MED ORDER — ONDANSETRON HCL 4 MG/2ML IJ SOLN
INTRAMUSCULAR | Status: DC | PRN
  Administered 2012-12-11: 4 mg via INTRAVENOUS

## 2012-12-11 MED ORDER — PROPOFOL 10 MG/ML IV BOLUS
INTRAVENOUS | Status: DC | PRN
  Administered 2012-12-11: 200 mg via INTRAVENOUS
  Administered 2012-12-11: 50 mg via INTRAVENOUS

## 2012-12-11 MED ORDER — PROPOFOL 10 MG/ML IV EMUL
INTRAVENOUS | Status: AC
  Filled 2012-12-11: qty 20

## 2012-12-11 MED ORDER — IBUPROFEN 600 MG PO TABS: 600.0000 mg | ORAL_TABLET | Freq: Four times a day (QID) | ORAL | Status: DC | PRN

## 2012-12-11 MED ORDER — LIDOCAINE HCL (CARDIAC) 20 MG/ML IV SOLN
INTRAVENOUS | Status: AC
  Filled 2012-12-11: qty 5

## 2012-12-11 MED ORDER — GLYCINE 1.5 % IR SOLN
Status: DC | PRN
  Administered 2012-12-11: 3000 mL

## 2012-12-11 MED ORDER — LACTATED RINGERS IV SOLN
INTRAVENOUS | Status: DC
  Administered 2012-12-11 (×2): via INTRAVENOUS

## 2012-12-11 MED ORDER — PHENYLEPHRINE HCL 10 MG/ML IJ SOLN
INTRAMUSCULAR | Status: DC | PRN
  Administered 2012-12-11 (×2): 80 ug via INTRAVENOUS

## 2012-12-11 MED ORDER — FENTANYL CITRATE 0.05 MG/ML IJ SOLN
INTRAMUSCULAR | Status: AC
  Filled 2012-12-11: qty 5

## 2012-12-11 MED ORDER — BUPIVACAINE HCL (PF) 0.25 % IJ SOLN
INTRAMUSCULAR | Status: AC
  Filled 2012-12-11: qty 30

## 2012-12-11 MED ORDER — ONDANSETRON HCL 4 MG/2ML IJ SOLN
INTRAMUSCULAR | Status: AC
  Filled 2012-12-11: qty 2

## 2012-12-11 MED ORDER — FENTANYL CITRATE 0.05 MG/ML IJ SOLN
INTRAMUSCULAR | Status: DC | PRN
  Administered 2012-12-11 (×2): 50 ug via INTRAVENOUS

## 2012-12-11 MED ORDER — MIDAZOLAM HCL 5 MG/5ML IJ SOLN
INTRAMUSCULAR | Status: DC | PRN
  Administered 2012-12-11: 1 mg via INTRAVENOUS

## 2012-12-11 MED ORDER — MIDAZOLAM HCL 2 MG/2ML IJ SOLN
INTRAMUSCULAR | Status: AC
  Filled 2012-12-11: qty 2

## 2012-12-11 MED ORDER — SILVER NITRATE-POT NITRATE 75-25 % EX MISC
CUTANEOUS | Status: AC
  Filled 2012-12-11: qty 5

## 2012-12-11 MED ORDER — METRONIDAZOLE 500 MG PO TABS: 500.0000 mg | ORAL_TABLET | Freq: Two times a day (BID) | ORAL | Status: DC

## 2012-12-11 MED ORDER — FENTANYL CITRATE 0.05 MG/ML IJ SOLN: 25.0000 ug | INTRAMUSCULAR | Status: DC | PRN

## 2012-12-11 MED ORDER — PHENYLEPHRINE 40 MCG/ML (10ML) SYRINGE FOR IV PUSH (FOR BLOOD PRESSURE SUPPORT)
PREFILLED_SYRINGE | INTRAVENOUS | Status: AC
  Filled 2012-12-11: qty 5

## 2012-12-11 MED ORDER — LIDOCAINE HCL (CARDIAC) 20 MG/ML IV SOLN
INTRAVENOUS | Status: DC | PRN
  Administered 2012-12-11: 70 mg via INTRAVENOUS

## 2012-12-11 MED ORDER — BUPIVACAINE HCL (PF) 0.25 % IJ SOLN
INTRAMUSCULAR | Status: DC | PRN
  Administered 2012-12-11: 10 mL

## 2012-12-11 SURGICAL SUPPLY — 17 items
CANISTER SUCTION 2500CC (MISCELLANEOUS) ×3 IMPLANT
CATH ROBINSON RED A/P 16FR (CATHETERS) ×3 IMPLANT
CLOTH BEACON ORANGE TIMEOUT ST (SAFETY) ×3 IMPLANT
CONTAINER PREFILL 10% NBF 60ML (FORM) ×6 IMPLANT
DRESSING TELFA 8X3 (GAUZE/BANDAGES/DRESSINGS) ×3 IMPLANT
ELECT REM PT RETURN 9FT ADLT (ELECTROSURGICAL) ×3
ELECTRODE REM PT RTRN 9FT ADLT (ELECTROSURGICAL) ×2 IMPLANT
GLOVE BIOGEL M 6.5 STRL (GLOVE) ×6 IMPLANT
GLOVE BIOGEL PI IND STRL 6.5 (GLOVE) ×2 IMPLANT
GLOVE BIOGEL PI INDICATOR 6.5 (GLOVE) ×1
GOWN PREVENTION PLUS XLARGE (GOWN DISPOSABLE) ×3 IMPLANT
GOWN STRL REIN XL XLG (GOWN DISPOSABLE) ×6 IMPLANT
LOOP ANGLED CUTTING 22FR (CUTTING LOOP) IMPLANT
PACK HYSTEROSCOPY LF (CUSTOM PROCEDURE TRAY) ×3 IMPLANT
PAD OB MATERNITY 4.3X12.25 (PERSONAL CARE ITEMS) ×3 IMPLANT
TOWEL OR 17X24 6PK STRL BLUE (TOWEL DISPOSABLE) ×6 IMPLANT
WATER STERILE IRR 1000ML POUR (IV SOLUTION) ×3 IMPLANT

## 2012-12-11 NOTE — Op Note (Signed)
12/11/2012  1:37 PM  PATIENT:  Whitney [name]  57 y.o. female  PRE-OPERATIVE DIAGNOSIS:  POSTMENOPAUSAL BLEEDING   POST-OPERATIVE DIAGNOSIS:  postmenopausal bleeding  PROCEDURE:  Procedure(s): DILATATION AND CURETTAGE /HYSTEROSCOPY (N/A)  SURGEON:  Surgeon(s) and Role:    * Taequan Stockhausen J. Landry Mellow, MD - Primary  PHYSICIAN ASSISTANT:   ASSISTANTS: none   ANESTHESIA:   general  EBL:  Total I/O In: 1600 [I.V.:1600] Out: 61 [Urine:50; Blood:20]  BLOOD ADMINISTERED:none  DRAINS: none   LOCAL MEDICATIONS USED:  MARCAINE     SPECIMEN:  Source of Specimen:  endometrial currettings   DISPOSITION OF SPECIMEN:  PATHOLOGY  COUNTS:  YES  TOURNIQUET:  * No tourniquets in log *  DICTATION: .Dragon Dictation  PLAN OF CARE: Discharge to home after PACU  PATIENT DISPOSITION:  PACU - hemodynamically stable.   Delay start of Pharmacological VTE agent (>24hrs) due to surgical blood loss or risk of bleeding: not applicable  Finding: extremely anteverted uterus. Small endometrial polyp...atrophic appearing endometrium.  Procedure: Patient was taken to the operating room where she was placed under general anesthesia. She was placed in the dorsal lithotomy position. She was prepped and draped in the usual sterile fashion. A speculum was placed into the vaginal vault. The anterior lip of the cervix was grasped with a single-tooth tenaculum. Quarter percent Marcaine was injected at the 4 and 8:00 positions of the cervix. The cervix was then sounded to 10 cm. The cervix was dilated to approximately 4 mm. Diagnostic hysteroscope was inserted. The findings noted above. The hysteroscope was removed. Sharp currettage was performed. The resectoscope was removed. A Sharp curet was introduced and endometrial curettings were obtained. The hysteroscope was then reinserted. Insertion of the hysteroscope was at a 80 degree angle due to severely anteverted  The endometrial polyp was still present. Due to the severely  anteverted uterus and extreme angle of the hysteroscope I decided not to try resection with the resectoscope.  There was no evidence of perforation. Hysteroscope was then removed. The single-tooth tenaculum was removed from the anterior lip of the cervix. Excellent hemostasis was noted. The speculum was removed from the patient's vagina. She was awakened from anesthesia taken care to the recovery room awake and in stable condition. Sponge lap and needle counts were correct x2.   Glycine deficit was 75 cc.

## 2012-12-11 NOTE — Transfer of Care (Signed)
Immediate Anesthesia Transfer of Care Note  Patient: Whitney [name]  Procedure(s) Performed: Procedure(s): DILATATION AND CURETTAGE /HYSTEROSCOPY (N/A)  Patient Location: PACU  Anesthesia Type:General  Level of Consciousness: awake, alert  and oriented  Airway & Oxygen Therapy: Patient Spontanous Breathing and Patient connected to nasal cannula oxygen  Post-op Assessment: Report given to PACU RN and Post -op Vital signs reviewed and stable  Post vital signs: stable  Complications: No apparent anesthesia complications

## 2012-12-11 NOTE — H&P (Signed)
Whitney [name] is an 57 y.o. female.  With postmenopausal bleeding presents for hysteroscopy D&C. Endometrial biopsy was unsuccessful in the office. Her endometrium on ultrasound was 8 mm.       Menstrual History:  No LMP recorded. Patient is postmenopausal.    Past Medical History  Diagnosis Date  . Depression   . OSA (obstructive sleep apnea)   . Asthma   . Sjogren's syndrome   . Cirrhosis   . Diabetes mellitus type II   . Hyperlipidemia   . Allergic rhinitis   . Fibromyalgia   . Rheumatic fever   . NASH (nonalcoholic steatohepatitis)   . Arthritis     Past Surgical History  Procedure Laterality Date  . Tonsillectomy    . Anterior and posterior repair      pubovaginal sling    Family History  Problem Relation Age of Onset  . Alzheimer's disease Father   . Hip fracture Father   . Allergies Daughter   . Asthma Father   . Asthma Paternal Grandmother   . Asthma Daughter   . Rheum arthritis Mother   . Heart disease Father   . Heart disease Mother     Social History:  reports that she has never smoked. She does not have any smokeless tobacco history on file. She reports that  drinks alcohol. She reports that she does not use illicit drugs.  Allergies:  Allergies  Allergen Reactions  . Doxycycline     (rosacea) hives   . Naprosyn (Naproxen)     rash    Prescriptions prior to admission  Medication Sig Dispense Refill  . buPROPion (WELLBUTRIN SR) 150 MG 12 hr tablet Take 150 mg by mouth 2 (two) times daily.        . Chlorpheniramine Maleate (CHLOR-TRIMETON PO) Take 1 tablet by mouth daily as needed (for allergies).       . Cholecalciferol (VITAMIN D) 2000 UNITS CAPS Take 2,000 Units by mouth daily.      Marland Kitchen CINNAMON PO Take by mouth 2 (two) times daily.        Marland Kitchen ezetimibe (ZETIA) 10 MG tablet Take 10 mg by mouth daily.      . Flaxseed, Linseed, (FLAX SEED OIL) 1000 MG CAPS Take by mouth 2 (two) times daily.        . metFORMIN (GLUCOPHAGE) 500 MG tablet Take  500-1,000 mg by mouth 2 (two) times daily. 1000 mg in the am and 500 mg in the pm      . MILK THISTLE PO Take by mouth 2 (two) times daily.        Marland Kitchen OVER THE COUNTER MEDICATION Take 1 capsule by mouth 2 (two) times daily. Salogen over the counter supplement        Review of Systems  Constitutional: Negative.   Respiratory: Negative.   Cardiovascular: Negative.   Gastrointestinal: Negative.   Genitourinary: Negative.   Musculoskeletal: Negative.   Neurological: Negative.   Psychiatric/Behavioral: Negative.   All other systems reviewed and are negative.    Blood pressure 135/88, pulse 94, temperature 99 F (37.2 C), temperature source Oral, resp. rate 18, SpO2 96.00%. Physical Exam  Constitutional: She is oriented to person, place, and time. She appears well-developed and well-nourished.  Cardiovascular: Normal rate and regular rhythm.   Respiratory: Effort normal.  GI: Soft.  Genitourinary: Vagina normal and uterus normal.  Musculoskeletal: Normal range of motion.  Neurological: She is alert and oriented to person, place, and time.  Skin: Skin  is warm and dry.  Psychiatric: She has a normal mood and affect.    Results for orders placed during the hospital encounter of 12/11/12 (from the past 24 hour(s))  CBC     Status: Abnormal   Collection Time    12/11/12 10:30 AM      Result Value Range   WBC 2.8 (*) 4.0 - 10.5 K/uL   RBC 4.68  3.87 - 5.11 MIL/uL   Hemoglobin 13.6  12.0 - 15.0 g/dL   HCT 41.6  36.0 - 46.0 %   MCV 88.9  78.0 - 100.0 fL   MCH 29.1  26.0 - 34.0 pg   MCHC 32.7  30.0 - 36.0 g/dL   RDW 13.9  11.5 - 15.5 %   Platelets 121 (*) 150 - 400 K/uL  GLUCOSE, CAPILLARY     Status: Abnormal   Collection Time    12/11/12 10:47 AM      Result Value Range   Glucose-Capillary 108 (*) 70 - 99 mg/dL    No results found.  Assessment/Plan: Postmenopausal bleeding  For hysteroscopy D&C and polypectomy. D/w pt r/b/a  infection/ bleeding damage to surrounding organs  and uterine perforation with need for further surgery.   Ozell Ferrera J. 12/11/2012, 12:30 PM

## 2012-12-12 NOTE — Anesthesia Postprocedure Evaluation (Signed)
  Anesthesia Post-op Note  Patient: Whitney [name]  Procedure(s) Performed: Procedure(s): DILATATION AND CURETTAGE /HYSTEROSCOPY (N/A) Patient is awake and responsive. Pain and nausea are reasonably well controlled. Vital signs are stable and clinically acceptable. Oxygen saturation is clinically acceptable. There are no apparent anesthetic complications at this time. Patient is ready for discharge.

## 2013-01-13 ENCOUNTER — Telehealth: Payer: Self-pay | Admitting: Oncology

## 2013-01-13 NOTE — Telephone Encounter (Signed)
C/D 01/13/13 for appt. 01/30/13

## 2013-01-13 NOTE — Telephone Encounter (Signed)
S/W PT AND GVE NP APPT 09/06 @ 1:30 W/DR. SHADAD REFERRING DR. LITTLE DX-DEC'D PLTS WELCOME PACKET MAILED.

## 2013-01-23 ENCOUNTER — Other Ambulatory Visit: Payer: Self-pay | Admitting: Oncology

## 2013-01-23 DIAGNOSIS — D696 Thrombocytopenia, unspecified: Secondary | ICD-10-CM

## 2013-01-30 ENCOUNTER — Ambulatory Visit: Payer: BC Managed Care – PPO

## 2013-01-30 ENCOUNTER — Other Ambulatory Visit (HOSPITAL_BASED_OUTPATIENT_CLINIC_OR_DEPARTMENT_OTHER): Payer: BC Managed Care – PPO | Admitting: Lab

## 2013-01-30 ENCOUNTER — Ambulatory Visit (HOSPITAL_BASED_OUTPATIENT_CLINIC_OR_DEPARTMENT_OTHER): Payer: BC Managed Care – PPO | Admitting: Oncology

## 2013-01-30 ENCOUNTER — Encounter: Payer: Self-pay | Admitting: Oncology

## 2013-01-30 VITALS — BP 133/78 | HR 98 | Temp 98.2°F | Resp 20 | Ht 63.0 in | Wt 262.2 lb

## 2013-01-30 DIAGNOSIS — D696 Thrombocytopenia, unspecified: Secondary | ICD-10-CM

## 2013-01-30 DIAGNOSIS — D72819 Decreased white blood cell count, unspecified: Secondary | ICD-10-CM

## 2013-01-30 LAB — CBC WITH DIFFERENTIAL/PLATELET
BASO%: 1.3 % (ref 0.0–2.0)
LYMPH%: 38 % (ref 14.0–49.7)
MCH: 29.5 pg (ref 25.1–34.0)
MCHC: 33.1 g/dL (ref 31.5–36.0)
MCV: 89.2 fL (ref 79.5–101.0)
MONO%: 11.6 % (ref 0.0–14.0)
Platelets: 106 10*3/uL — ABNORMAL LOW (ref 145–400)
RBC: 4.38 10*6/uL (ref 3.70–5.45)
WBC: 2.8 10*3/uL — ABNORMAL LOW (ref 3.9–10.3)

## 2013-01-30 LAB — COMPREHENSIVE METABOLIC PANEL (CC13)
ALT: 45 U/L (ref 0–55)
Alkaline Phosphatase: 94 U/L (ref 40–150)
Sodium: 141 mEq/L (ref 136–145)
Total Bilirubin: 1.46 mg/dL — ABNORMAL HIGH (ref 0.20–1.20)
Total Protein: 6.8 g/dL (ref 6.4–8.3)

## 2013-01-30 NOTE — Progress Notes (Signed)
Checked in new pt with no financial concerns. °

## 2013-01-30 NOTE — Progress Notes (Signed)
Please see consult note.  

## 2013-01-30 NOTE — Consult Note (Signed)
Reason for Referral: Thrombocytopenia and leukopenia.   HPI: This is a pleasant 57 year old woman currently of [place] where she lived the majority of her life. She is currently working as a school principal in the area. She has a past medical history detailed today most importantly significant for hepatic cirrhosis. She is currently involved in a clinical trial at [facility] and she travels there every 2 weeks. She is currently on a problem that requires IV infusion over medication or placebo. It was noted for the last few years that she has mild leukopenia and thrombocytopenia. She also developed postmenopausal bleeding and underwent D. and C. with a hysteroscopy on 12/11/2012. She did develop bleeding subsequent to that that was heavy at times but since have subsided. At that time she was noted to have total white cell count of 2.8 and a platelet count of 121. For that reason patient referred to me for an evaluation. Reviewing her records the last few years her platelet counts have ranged as high as 164 and as low as 106. She also had a CT scan on 10/31/2012 which showed morphological changes compatible with cirrhosis of the liver also with abdominal that he sees and splenomegaly due to portal venous hypertension.  Clinically, she is currently asymptomatic. She is not reporting any fevers or chills or sweats. Is not reporting any weight loss or appetite changes. She is not reporting any major changes in her performance status or activity level. Discontinue work full time and drives 30 minutes each way to work. She is not reporting any epistaxis or hematochezia. She has not reported any hemoptysis or hematemesis. She does report occasional bruising in her lower extremity as well as lower extremity edema. She has not reported any abdominal pain or early satiety. Has not reported any thrombosis or hospitalization for infection recently.   Past Medical History  Diagnosis Date  .  Depression   . OSA (obstructive sleep apnea)   . Asthma   . Sjogren's syndrome   . Cirrhosis   . Diabetes mellitus type II   . Hyperlipidemia   . Allergic rhinitis   . Fibromyalgia   . Rheumatic fever   . NASH (nonalcoholic steatohepatitis)   . Arthritis   :  Past Surgical History  Procedure Laterality Date  . Tonsillectomy    . Anterior and posterior repair      pubovaginal sling  . Hysteroscopy w/d&c N/A 12/11/2012    Procedure: DILATATION AND CURETTAGE /HYSTEROSCOPY;  Surgeon: Maeola Sarah. Landry Mellow, MD;  Location: [facility];  Service: Gynecology;  Laterality: N/A;  :  Current Outpatient Prescriptions  Medication Sig Dispense Refill  . buPROPion (WELLBUTRIN SR) 150 MG 12 hr tablet Take 150 mg by mouth 2 (two) times daily.        . Chlorpheniramine Maleate (CHLOR-TRIMETON PO) Take 1 tablet by mouth daily as needed (for allergies).       . Cholecalciferol (VITAMIN D) 2000 UNITS CAPS Take 2,000 Units by mouth daily.      Marland Kitchen CINNAMON PO Take by mouth 2 (two) times daily.        Marland Kitchen ezetimibe (ZETIA) 10 MG tablet Take 10 mg by mouth daily.      . Flaxseed, Linseed, (FLAX SEED OIL) 1000 MG CAPS Take by mouth 2 (two) times daily.        Marland Kitchen ibuprofen (ADVIL) 600 MG tablet Take 1 tablet (600 mg total) by mouth every 6 (six) hours as needed for pain.  30 tablet  0  . metFORMIN (GLUCOPHAGE) 500 MG tablet Take 500-1,000 mg by mouth 2 (two) times daily. 1000 mg in the am and 500 mg in the pm      . MILK THISTLE PO Take by mouth 2 (two) times daily.        Marland Kitchen OVER THE COUNTER MEDICATION Take 1 capsule by mouth 2 (two) times daily. Salogen over the counter supplement      . pilocarpine (SALAGEN) 5 MG tablet Take 5 mg by mouth 2 (two) times daily.       No current facility-administered medications for this visit.       Allergies  Allergen Reactions  . Doxycycline     (rosacea) hives   . Naprosyn [Naproxen]     rash  :  Family History  Problem Relation Age of Onset  . Alzheimer's disease Father    . Hip fracture Father   . Allergies Daughter   . Asthma Father   . Asthma Paternal Grandmother   . Asthma Daughter   . Rheum arthritis Mother   . Heart disease Father   . Heart disease Mother   :  History   Social History  . Marital Status: Married    Spouse Name: Married to [employer]    Number of Children: N/A  . Years of Education: N/A   Occupational History  . principal of [employer]    Social History Main Topics  . Smoking status: Never Smoker   . Smokeless tobacco: Not on file  . Alcohol Use: Yes     Comment: rarely  . Drug Use: No  . Sexual Activity: Not on file   Other Topics Concern  . Not on file   Social History Narrative  . No narrative on file  :  A comprehensive review of systems was negative.  Exam: Blood pressure 133/78, pulse 98, temperature 98.2 F (36.8 C), temperature source Oral, resp. rate 20, height 5' 3"  (1.6 m), weight 262 lb 3.2 oz (118.933 kg). General appearance: alert, cooperative and appears stated age Head: Normocephalic, without obvious abnormality, atraumatic Eyes: conjunctivae/corneas clear. PERRL, EOM's intact. Fundi benign. Throat: lips, mucosa, and tongue normal; teeth and gums normal Neck: no adenopathy, no carotid bruit, no JVD, supple, symmetrical, trachea midline and thyroid not enlarged, symmetric, no tenderness/mass/nodules Back: symmetric, no curvature. ROM normal. No CVA tenderness. Resp: clear to auscultation bilaterally Chest wall: no tenderness Cardio: regular rate and rhythm, S1, S2 normal, no murmur, click, rub or gallop GI: soft, non-tender; bowel sounds normal; no masses,  no organomegaly Extremities: extremities normal, atraumatic, no cyanosis or edema Pulses: 2+ and symmetric Skin: Skin color, texture, turgor normal. No rashes or lesions Lymph nodes: Cervical, supraclavicular, and axillary nodes normal. Neurologic: Alert and oriented X 3, normal strength and tone. Normal symmetric reflexes. Normal  coordination and gait   Recent Labs  01/30/13 1319  WBC 2.8*  HGB 12.9  HCT 39.1  PLT 106*    Recent Labs  01/30/13 1320  NA 141  K 3.8  CO2 29  GLUCOSE 143*  BUN 5.9*  CREATININE 0.7  CALCIUM 9.0     Blood smear review: Her smear was personally reviewed today and showed normal red cells without any schistocytosis or fragmentation. Her platelets are decreased in number but enlarged in size and there was no evidence of any dysplasia or immature white cells.   Assessment and Plan:   57 year old woman with the following issues:  1. Thrombocytopenia: Her platelet counts have  ranged as low as 105 and as high 160. The differential diagnosis was discussed today with the patient in detail. The most likely etiology we are dealing with his thrombocytopenia due to splenomegaly secondary to portal hypertension. There is documented by a CT scan that was done on 10/31/2012 confirming of these findings. I do not see any evidence to suggest a hematological disorder, DIC or any malignancy. Liver disease and that by itself can also cause mild thrombocytopenia. Further management standpoint, there is really no indication for transfusion of platelets. There is really no reason for intervention at this time. Her platelet count is adequate and she does not have any bleeding. I counseled her today about her risk of bleeding in general due to her liver disease as well as her portal hypertension and varices. The fact that she has cirrhosis of the liver put her at a high risk of coagulopathy in general due to deficiency in coagulation factors. At this point no further testing is necessary and she is continued to be followed closely by hepatology at [facility].  2. Leukocytopenia: I believe this is also related to her liver disease her peripheral smear and does not suggest any hematological disorder and no workup is needed at this time.  All her questions were answered today I will be  happy to see her in the future as needed.

## 2013-03-16 ENCOUNTER — Encounter: Payer: Self-pay | Admitting: Interventional Cardiology

## 2013-04-18 ENCOUNTER — Encounter: Payer: Self-pay | Admitting: Interventional Cardiology

## 2013-04-18 DIAGNOSIS — M797 Fibromyalgia: Secondary | ICD-10-CM | POA: Insufficient documentation

## 2013-04-18 DIAGNOSIS — G4733 Obstructive sleep apnea (adult) (pediatric): Secondary | ICD-10-CM | POA: Insufficient documentation

## 2013-04-18 DIAGNOSIS — K7460 Unspecified cirrhosis of liver: Secondary | ICD-10-CM | POA: Insufficient documentation

## 2013-04-18 DIAGNOSIS — M3500 Sicca syndrome, unspecified: Secondary | ICD-10-CM | POA: Insufficient documentation

## 2013-04-18 DIAGNOSIS — E785 Hyperlipidemia, unspecified: Secondary | ICD-10-CM | POA: Insufficient documentation

## 2013-04-18 DIAGNOSIS — F329 Major depressive disorder, single episode, unspecified: Secondary | ICD-10-CM | POA: Insufficient documentation

## 2013-04-20 ENCOUNTER — Encounter: Payer: Self-pay | Admitting: Interventional Cardiology

## 2013-04-20 ENCOUNTER — Ambulatory Visit (INDEPENDENT_AMBULATORY_CARE_PROVIDER_SITE_OTHER): Payer: BC Managed Care – PPO | Admitting: Interventional Cardiology

## 2013-04-20 VITALS — BP 110/80 | HR 94 | Ht 64.0 in | Wt 254.1 lb

## 2013-04-20 DIAGNOSIS — I359 Nonrheumatic aortic valve disorder, unspecified: Secondary | ICD-10-CM

## 2013-04-20 DIAGNOSIS — E785 Hyperlipidemia, unspecified: Secondary | ICD-10-CM

## 2013-04-20 DIAGNOSIS — R079 Chest pain, unspecified: Secondary | ICD-10-CM

## 2013-04-20 DIAGNOSIS — R609 Edema, unspecified: Secondary | ICD-10-CM

## 2013-04-20 NOTE — Patient Instructions (Signed)
Your physician has requested that you have an exercise tolerance test. For further information please visit [email or web address]. Please also follow instruction sheet, as given.  Your physician recommends that you continue on your current medications as directed. Please refer to the Current Medication list given to you today.  Your physician wants you to follow-up in:  1 year with Dr. Irish Lack. You will receive a reminder letter in the mail two months in advance. If you don't receive a letter, please call our office to schedule the follow-up appointment.

## 2013-04-20 NOTE — Progress Notes (Signed)
Patient ID: Whitney [name], female   DOB: Jun 30, 1955, 57 y.o.   MRN: [id number]    [place], [place], [place]  [address] Phone: [phone] Fax:  [phone]  Date:  04/20/2013   ID:  Whitney [name], DOB [birth year], MRN [id number]  PCP:  Whitney Pac, MD      History of Present Illness: Whitney [name] is a 57 y.o. female who has had mid to moderate aortic stenosis. SHe has gained weight in the past year. She has not been exercising. No chest pain. Worsening exertional SHOB that resolves quickly. At random times, she has had a feeling in her left chest wall that is relieved with palpitation. Not related to walking. Only occurs at while still. Rare episodes. No lightheadedness or passing out. Worsening swelling several weeks ago. Better with compression stockings.  SHe has some focal right leg pain with palpitations or when her dogs jump on it. She has NASH and is treated at [facility].  Albumin was low on recent check at 3.2.  Other LFTs  Are slightly elevated   Wt Readings from Last 3 Encounters:  04/20/13 254 lb 1.9 oz (115.268 kg)  01/30/13 262 lb 3.2 oz (118.933 kg)  12/03/12 255 lb (115.667 kg)     Past Medical History  Diagnosis Date  . Depression   . OSA (obstructive sleep apnea)   . Asthma   . Sjogren's syndrome   . Cirrhosis   . Diabetes mellitus type II   . Hyperlipidemia   . Allergic rhinitis   . Fibromyalgia   . Rheumatic fever   . NASH (nonalcoholic steatohepatitis)   . Arthritis     Current Outpatient Prescriptions  Medication Sig Dispense Refill  . buPROPion (WELLBUTRIN SR) 150 MG 12 hr tablet Take 150 mg by mouth 2 (two) times daily.        . Cholecalciferol (VITAMIN D) 2000 UNITS CAPS Take 2,000 Units by mouth daily.      Marland Kitchen CINNAMON PO Take by mouth 2 (two) times daily.        Marland Kitchen ezetimibe (ZETIA) 10 MG tablet Take 10 mg by mouth daily.      . Flaxseed, Linseed, (FLAX SEED OIL) 1000 MG CAPS Take by mouth 2 (two) times daily.        .  metFORMIN (GLUCOPHAGE) 500 MG tablet Take 500-1,000 mg by mouth 2 (two) times daily. 1000 mg in the am and 500 mg in the pm      . MILK THISTLE PO Take by mouth 2 (two) times daily.         No current facility-administered medications for this visit.    Allergies:    Allergies  Allergen Reactions  . Doxycycline     (rosacea) hives   . Naprosyn [Naproxen]     rash    Social History:  The patient  reports that she has never smoked. She does not have any smokeless tobacco history on file. She reports that she drinks alcohol. She reports that she does not use illicit drugs.   Family History:  The patient's family history includes Allergies in her daughter; Alzheimer's disease in her father; Asthma in her daughter, father, and paternal grandmother; Heart disease in her father and mother; Hip fracture in her father; Rheum arthritis in her mother.   ROS:  Please see the history of present illness.  No nausea, vomiting.  No fevers, chills.  No focal weakness.  No dysuria.  All other systems reviewed and negative.   PHYSICAL EXAM: VS:  BP 110/80  Pulse 94  Ht 5' 4"  (1.626 m)  Wt 254 lb 1.9 oz (115.268 kg)  BMI 43.60 kg/m2 Well nourished, well developed, in no acute distress HEENT: normal Neck: no JVD, no carotid bruits Cardiac:  normal S1, S2; RRR;  Lungs:  clear to auscultation bilaterally, no wheezing, rhonchi or rales Abd: soft, nontender, no hepatomegaly Ext: no edema with stockings; one tender area on right shin Skin: warm and dry Neuro:   no focal abnormalities noted  EKG: NSR, no ST segment changes      ASSESSMENT AND PLAN:  Bilateral leg edema  Notes: Elevate legs at night. Consider diuretic.  Improved with compression stockings. Venous insufficiency is most likely cause. Albumin slightly low based on labs from [facility]. This may be contributing as well, from her liver disease.    2. Aortic valve disease  Notes: Moderate aortic stenosis by prior echo. Does not seem to be  affecting exercise tolerance. Resume walking. Discussed ACE-I and statin as ways of preventing progression of aortic valve disease. Given her liver issues, she does not want to take a statin. Her BP is OK and does not want to take an ACE-I.    3. Short of breath on exertion / Atypical chest pain LAB: Basic Metabolic LAB: BNP Notes: Normal BNP a few months ago.. Normal EF by echo in Jan 2014.  Try to increase exercise to improve stamina. Weight loss would be beneficial as well.  She has some pains not related to exertion.  It feel ssharp.  Se thinks ahe can walk the treadmill.  Will plan or ETT.           Labs    Lab: Basic Metabolic  GLUCOSE 757 H 97-28 - mg/dL  BUN 6  6-26 - mg/dL  CREATININE 0.55 L 0.60-1.30 - mg/dl  eGFR (NON-AFRICAN AMERICAN) 113  >60 - calc  eGFR (AFRICAN AMERICAN) 137  >60 - calc  SODIUM 137  136-145 - mmol/L  POTASSIUM 4.3  3.5-5.5 - mmol/L  CHLORIDE 103  98-107 - mmol/L  C02 31  22-32 - mmol/L  ANION GAP 8.3  6.0-20.0 - mmol/L  CALCIUM 9.1  8.6-10.3 - mg/dL  BUN/CR ratio 11.5 L 12.0-20.0 - calc   Whitney [name],Whitney [name] 01/30/2013 04:51:19 PM > stable kidney function. Whitney [name],Whitney [name] 01/30/2013 04:53:55 PM > Pt notified.        Lab: BNP  B-NATRIURETIC PEPTIDE 16  0-100 - pg/mL   Whitney [name],Whitney [name] 01/30/2013 04:50:46 PM > normal . no extra fluid causing SHOB. Elevate legs to help with swelling. Whitney [name],Whitney [name] 01/30/2013 04:53:38 PM > Pt notified.         Signed, Whitney Marble, MD, [facility] 04/20/2013 4:30 PM

## 2013-04-21 DIAGNOSIS — R609 Edema, unspecified: Secondary | ICD-10-CM | POA: Insufficient documentation

## 2013-04-21 DIAGNOSIS — I359 Nonrheumatic aortic valve disorder, unspecified: Secondary | ICD-10-CM | POA: Insufficient documentation

## 2013-04-21 DIAGNOSIS — R601 Generalized edema: Secondary | ICD-10-CM | POA: Insufficient documentation

## 2013-05-05 ENCOUNTER — Ambulatory Visit (HOSPITAL_COMMUNITY)
Admission: RE | Admit: 2013-05-05 | Discharge: 2013-05-05 | Disposition: A | Discharge status: Home / Self Care | Payer: BC Managed Care – PPO | Source: Ambulatory Visit | Attending: Interventional Cardiology | Admitting: Interventional Cardiology

## 2013-05-05 DIAGNOSIS — R079 Chest pain, unspecified: Secondary | ICD-10-CM | POA: Insufficient documentation

## 2013-05-13 ENCOUNTER — Telehealth: Payer: Self-pay | Admitting: Cardiology

## 2013-05-13 DIAGNOSIS — I359 Nonrheumatic aortic valve disorder, unspecified: Secondary | ICD-10-CM

## 2013-05-13 NOTE — Telephone Encounter (Signed)
Pts last echo was 06/04/12, which stated pt needed f/u echo in 1 year. [name] please schedule echo and forward this note back to me.

## 2013-05-13 NOTE — Telephone Encounter (Signed)
Pt.notified

## 2013-05-13 NOTE — Telephone Encounter (Signed)
Echo scheduled for 06/08/2013 @ 9:30

## 2013-05-15 ENCOUNTER — Other Ambulatory Visit: Payer: BC Managed Care – PPO

## 2013-05-15 ENCOUNTER — Other Ambulatory Visit: Payer: Self-pay | Admitting: Family Medicine

## 2013-05-15 DIAGNOSIS — M79609 Pain in unspecified limb: Secondary | ICD-10-CM

## 2013-05-18 ENCOUNTER — Ambulatory Visit
Admission: RE | Admit: 2013-05-18 | Discharge: 2013-05-18 | Disposition: A | Discharge status: Home / Self Care | Payer: BC Managed Care – PPO | Source: Ambulatory Visit | Attending: Family Medicine | Admitting: Family Medicine

## 2013-05-18 DIAGNOSIS — M79609 Pain in unspecified limb: Secondary | ICD-10-CM

## 2013-06-03 ENCOUNTER — Ambulatory Visit: Payer: BC Managed Care – PPO | Admitting: Interventional Cardiology

## 2013-06-08 ENCOUNTER — Other Ambulatory Visit (HOSPITAL_COMMUNITY): Payer: BC Managed Care – PPO

## 2013-06-14 ENCOUNTER — Encounter (HOSPITAL_COMMUNITY): Payer: Self-pay | Admitting: Emergency Medicine

## 2013-06-14 ENCOUNTER — Emergency Department (HOSPITAL_COMMUNITY)
Admission: EM | Admit: 2013-06-14 | Discharge: 2013-06-15 | Disposition: A | Discharge status: Home / Self Care | Payer: BC Managed Care – PPO | Attending: Emergency Medicine | Admitting: Emergency Medicine

## 2013-06-14 ENCOUNTER — Emergency Department (HOSPITAL_COMMUNITY): Payer: BC Managed Care – PPO

## 2013-06-14 DIAGNOSIS — Z8719 Personal history of other diseases of the digestive system: Secondary | ICD-10-CM | POA: Insufficient documentation

## 2013-06-14 DIAGNOSIS — Z8669 Personal history of other diseases of the nervous system and sense organs: Secondary | ICD-10-CM | POA: Insufficient documentation

## 2013-06-14 DIAGNOSIS — Z8739 Personal history of other diseases of the musculoskeletal system and connective tissue: Secondary | ICD-10-CM | POA: Insufficient documentation

## 2013-06-14 DIAGNOSIS — J45901 Unspecified asthma with (acute) exacerbation: Secondary | ICD-10-CM | POA: Insufficient documentation

## 2013-06-14 DIAGNOSIS — Z872 Personal history of diseases of the skin and subcutaneous tissue: Secondary | ICD-10-CM | POA: Insufficient documentation

## 2013-06-14 DIAGNOSIS — E119 Type 2 diabetes mellitus without complications: Secondary | ICD-10-CM | POA: Insufficient documentation

## 2013-06-14 DIAGNOSIS — M129 Arthropathy, unspecified: Secondary | ICD-10-CM | POA: Insufficient documentation

## 2013-06-14 DIAGNOSIS — Z9109 Other allergy status, other than to drugs and biological substances: Secondary | ICD-10-CM | POA: Insufficient documentation

## 2013-06-14 DIAGNOSIS — F329 Major depressive disorder, single episode, unspecified: Secondary | ICD-10-CM | POA: Insufficient documentation

## 2013-06-14 DIAGNOSIS — M79609 Pain in unspecified limb: Secondary | ICD-10-CM | POA: Insufficient documentation

## 2013-06-14 DIAGNOSIS — F3289 Other specified depressive episodes: Secondary | ICD-10-CM | POA: Insufficient documentation

## 2013-06-14 DIAGNOSIS — E785 Hyperlipidemia, unspecified: Secondary | ICD-10-CM | POA: Insufficient documentation

## 2013-06-14 DIAGNOSIS — R609 Edema, unspecified: Secondary | ICD-10-CM | POA: Insufficient documentation

## 2013-06-14 DIAGNOSIS — Z9089 Acquired absence of other organs: Secondary | ICD-10-CM | POA: Insufficient documentation

## 2013-06-14 DIAGNOSIS — Z79899 Other long term (current) drug therapy: Secondary | ICD-10-CM | POA: Insufficient documentation

## 2013-06-14 DIAGNOSIS — R0789 Other chest pain: Secondary | ICD-10-CM | POA: Insufficient documentation

## 2013-06-14 LAB — CBC WITH DIFFERENTIAL/PLATELET
BASOS PCT: 0 % (ref 0–1)
Basophils Absolute: 0 10*3/uL (ref 0.0–0.1)
EOS PCT: 4 % (ref 0–5)
Eosinophils Absolute: 0.1 10*3/uL (ref 0.0–0.7)
HEMATOCRIT: 36.7 % (ref 36.0–46.0)
HEMOGLOBIN: 12.3 g/dL (ref 12.0–15.0)
LYMPHS PCT: 33 % (ref 12–46)
Lymphs Abs: 0.9 10*3/uL (ref 0.7–4.0)
MCH: 30.1 pg (ref 26.0–34.0)
MCHC: 33.5 g/dL (ref 30.0–36.0)
MCV: 90 fL (ref 78.0–100.0)
MONO ABS: 0.4 10*3/uL (ref 0.1–1.0)
Monocytes Relative: 13 % — ABNORMAL HIGH (ref 3–12)
Neutro Abs: 1.4 10*3/uL — ABNORMAL LOW (ref 1.7–7.7)
Neutrophils Relative %: 49 % (ref 43–77)
Platelets: 101 10*3/uL — ABNORMAL LOW (ref 150–400)
RBC: 4.08 MIL/uL (ref 3.87–5.11)
RDW: 14.4 % (ref 11.5–15.5)
WBC: 2.8 10*3/uL — ABNORMAL LOW (ref 4.0–10.5)

## 2013-06-14 LAB — TROPONIN I: Troponin I: <0.3 ng/mL (ref <0.30)

## 2013-06-14 LAB — BASIC METABOLIC PANEL
BUN: 9 mg/dL (ref 6–23)
CHLORIDE: 100 meq/L (ref 96–112)
CO2: 25 meq/L (ref 19–32)
CREATININE: 0.58 mg/dL (ref 0.50–1.10)
Calcium: 8.6 mg/dL (ref 8.4–10.5)
GFR calc Af Amer: >90 mL/min (ref >90)
GFR calc non Af Amer: >90 mL/min (ref >90)
Glucose, Bld: 166 mg/dL — ABNORMAL HIGH (ref 70–99)
Potassium: 4.4 mEq/L (ref 3.7–5.3)
Sodium: 136 mEq/L — ABNORMAL LOW (ref 137–147)

## 2013-06-14 LAB — D-DIMER, QUANTITATIVE: D-Dimer, Quant: 0.84 ug/mL-FEU — ABNORMAL HIGH (ref 0.00–0.48)

## 2013-06-14 LAB — PRO B NATRIURETIC PEPTIDE: Pro B Natriuretic peptide (BNP): 51.6 pg/mL (ref 0–125)

## 2013-06-14 MED ORDER — MORPHINE SULFATE 4 MG/ML IJ SOLN
4.0000 mg | Freq: Once | INTRAMUSCULAR | Status: AC
  Administered 2013-06-14: 4 mg via INTRAVENOUS
  Filled 2013-06-14: qty 1

## 2013-06-14 MED ORDER — ASPIRIN 325 MG PO TABS
325.0000 mg | ORAL_TABLET | Freq: Once | ORAL | Status: AC
  Administered 2013-06-14: 325 mg via ORAL
  Filled 2013-06-14: qty 1

## 2013-06-14 MED ORDER — IOHEXOL 350 MG/ML SOLN
100.0000 mL | Freq: Once | INTRAVENOUS | Status: AC | PRN
  Administered 2013-06-14: 100 mL via INTRAVENOUS

## 2013-06-14 MED ORDER — NITROGLYCERIN 0.4 MG SL SUBL
0.4000 mg | SUBLINGUAL_TABLET | SUBLINGUAL | Status: DC | PRN
  Administered 2013-06-14: 0.4 mg via SUBLINGUAL
  Filled 2013-06-14: qty 25

## 2013-06-14 NOTE — ED Notes (Signed)
Pt given 2 mg IV morphine to start w/ to ensure pt is not oversedated.  Will reassess pain in 30 minutes if pt can tolerate will administer remaining 2 mg.

## 2013-06-14 NOTE — ED Notes (Addendum)
Pt c/o intermittent central and L sided chest pain and R leg pain x 5-6 months.  Pt sts chest pain has become constant and she now has some upper back pain x 3-4 days.  Pain score 9/10 and 5/10, respectively.  Pt sts "knot" and discoloration on RLE that increases with palpation.  Pt has been seen several times at different facilities for leg pain.  Pt is concerned about having a blood clot.  Reports an ultrasound several weeks ago was inconclusive, chest xray last week, and lab work yesterday.

## 2013-06-14 NOTE — ED Notes (Signed)
Pt c/o chest pain nitro x 1 given w/o relief of chest pain, BP dropped to 92/57 NS bolus started.  Will notify MD and continue to monitor.

## 2013-06-14 NOTE — ED Provider Notes (Signed)
CSN: [id number]     Arrival date & time 06/14/13  1734 History   First MD Initiated Contact with Patient 06/14/13 1805     Chief Complaint  Patient presents with  . Chest Pain  . Leg Pain   (Consider location/radiation/quality/duration/timing/severity/associated sxs/prior Treatment) Patient is a 58 y.o. female presenting with chest pain and leg pain. The history is provided by the patient. No language interpreter was used.  Chest Pain Pain location:  L chest Pain quality: sharp   Radiates to: pain was at first in back, shoulder, then moved to chest. Pain radiates to the back: yes   Pain severity:  Moderate Onset quality:  Unable to specify Duration:  4 days Timing:  Constant Progression:  Waxing and waning Chronicity:  Recurrent Context: at rest   Relieved by:  Nothing Worsened by:  Nothing tried Ineffective treatments:  None tried Associated symptoms: abdominal pain, cough (chronic, unchanged), lower extremity edema and shortness of breath   Associated symptoms: no back pain, no diaphoresis, no fatigue, no fever, no headache, no nausea, no near-syncope, no numbness, no palpitations, not vomiting and no weakness   Cough:    Cough characteristics:  Non-productive and dry   Severity:  Moderate   Timing:  Intermittent   Progression:  Waxing and waning   Chronicity:  Chronic Leg Pain Associated symptoms: no back pain, no fatigue, no fever and no neck pain     Past Medical History  Diagnosis Date  . Depression   . OSA (obstructive sleep apnea)   . Asthma   . Sjogren's syndrome   . Cirrhosis   . Diabetes mellitus type II   . Hyperlipidemia   . Allergic rhinitis   . Fibromyalgia   . Rheumatic fever   . NASH (nonalcoholic steatohepatitis)   . Arthritis    Past Surgical History  Procedure Laterality Date  . Tonsillectomy    . Anterior and posterior repair      pubovaginal sling  . Hysteroscopy w/d&c N/A 12/11/2012    Procedure: DILATATION AND CURETTAGE /HYSTEROSCOPY;   Surgeon: Maeola Sarah. Landry Mellow, MD;  Location: [facility];  Service: Gynecology;  Laterality: N/A;   Family History  Problem Relation Age of Onset  . Alzheimer's disease Father   . Hip fracture Father   . Allergies Daughter   . Asthma Father   . Asthma Paternal Grandmother   . Asthma Daughter   . Rheum arthritis Mother   . Heart disease Father   . Heart disease Mother    History  Substance Use Topics  . Smoking status: Never Smoker   . Smokeless tobacco: Not on file  . Alcohol Use: Yes     Comment: rarely   OB History   Grav Para Term Preterm Abortions TAB SAB Ect Mult Living                 Review of Systems  Constitutional: Negative for fever, chills, diaphoresis, activity change, appetite change and fatigue.  HENT: Negative for congestion, facial swelling, rhinorrhea and sore throat.   Eyes: Negative for photophobia and discharge.  Respiratory: Positive for cough (chronic, unchanged) and shortness of breath. Negative for chest tightness.   Cardiovascular: Positive for chest pain. Negative for palpitations, leg swelling and near-syncope.  Gastrointestinal: Positive for abdominal pain. Negative for nausea, vomiting and diarrhea.  Endocrine: Negative for polydipsia and polyuria.  Genitourinary: Negative for dysuria, frequency, difficulty urinating and pelvic pain.  Musculoskeletal: Negative for arthralgias, back pain, neck pain and neck stiffness.  Skin: Negative for color change and wound.  Allergic/Immunologic: Negative for immunocompromised state.  Neurological: Negative for facial asymmetry, weakness, numbness and headaches.  Hematological: Does not bruise/bleed easily.  Psychiatric/Behavioral: Negative for confusion and agitation.    Allergies  Doxycycline and Naprosyn  Home Medications   Current Outpatient Rx  Name  Route  Sig  Dispense  Refill  . buPROPion (WELLBUTRIN SR) 150 MG 12 hr tablet   Oral   Take 150 mg by mouth 2 (two) times daily.           .  Cholecalciferol (VITAMIN D) 2000 UNITS CAPS   Oral   Take 2,000 Units by mouth daily.         Marland Kitchen CINNAMON PO   Oral   Take by mouth 2 (two) times daily.           Marland Kitchen ezetimibe (ZETIA) 10 MG tablet   Oral   Take 10 mg by mouth at bedtime.          . Flaxseed, Linseed, (FLAX SEED OIL) 1000 MG CAPS   Oral   Take by mouth 2 (two) times daily.           Marland Kitchen ibuprofen (ADVIL,MOTRIN) 200 MG tablet   Oral   Take 200 mg by mouth every 6 (six) hours as needed (PAIN).         Marland Kitchen metFORMIN (GLUCOPHAGE) 500 MG tablet   Oral   Take 500-1,000 mg by mouth 2 (two) times daily. 1000 mg in the am and 500 mg in the pm         . MILK THISTLE PO   Oral   Take by mouth 2 (two) times daily.           . Probiotic Product (PROBIOTIC DAILY PO)   Oral   Take 1 tablet by mouth daily.         . vitamin B-12 (CYANOCOBALAMIN) 1000 MCG tablet   Oral   Take 1,000 mcg by mouth daily.          BP 103/56  Pulse 85  Temp(Src) 99.5 F (37.5 C) (Oral)  Resp 20  SpO2 95% Physical Exam  Constitutional: She is oriented to person, place, and time. She appears well-developed and well-nourished. No distress.  HENT:  Head: Normocephalic and atraumatic.  Mouth/Throat: No oropharyngeal exudate.  Eyes: Pupils are equal, round, and reactive to light.  Neck: Normal range of motion. Neck supple.  Cardiovascular: Normal rate, regular rhythm and normal heart sounds.  Exam reveals no gallop and no friction rub.   No murmur heard. Pulmonary/Chest: Effort normal and breath sounds normal. No respiratory distress. She has no wheezes. She has no rales. She exhibits tenderness.    Abdominal: Soft. Bowel sounds are normal. She exhibits no distension and no mass. There is no tenderness. There is no rebound and no guarding.  Musculoskeletal: Normal range of motion. She exhibits no edema and no tenderness.  Neurological: She is alert and oriented to person, place, and time.  Skin: Skin is warm and dry.    Psychiatric: She has a normal mood and affect.    ED Course  Procedures (including critical care time) Labs Review Labs Reviewed  CBC WITH DIFFERENTIAL - Abnormal; Notable for the following:    WBC 2.8 (*)    Platelets 101 (*)    Neutro Abs 1.4 (*)    Monocytes Relative 13 (*)    All other components within normal limits  BASIC METABOLIC PANEL -  Abnormal; Notable for the following:    Sodium 136 (*)    Glucose, Bld 166 (*)    All other components within normal limits  D-DIMER, QUANTITATIVE - Abnormal; Notable for the following:    D-Dimer, Quant 0.84 (*)    All other components within normal limits  TROPONIN I  PRO B NATRIURETIC PEPTIDE  TROPONIN I   Imaging Review Dg Chest 2 View  06/14/2013   CLINICAL DATA:  Chest pain  EXAM: CHEST  2 VIEW  COMPARISON:  June 05, 2011  FINDINGS: The lungs are clear. The heart size and pulmonary vascularity are normal. No adenopathy. No bone lesions. There is mild degenerative change in the thoracic spine.  IMPRESSION: No edema or consolidation.   Electronically Signed   By: Lowella Grip M.D.   On: 06/14/2013 19:22   Ct Angio Chest Pe W/cm &/or Wo Cm  06/14/2013   CLINICAL DATA:  Shortness of breath, leg swelling. Elevated D-dimer.  EXAM: CT ANGIOGRAPHY CHEST WITH CONTRAST  TECHNIQUE: Multidetector CT imaging of the chest was performed using the standard protocol during bolus administration of intravenous contrast. Multiplanar CT image reconstructions including MIPs were obtained to evaluate the vascular anatomy.  CONTRAST:  127m OMNIPAQUE IOHEXOL 350 MG/ML SOLN  COMPARISON:  DG CHEST 2 VIEW dated 06/14/2013  FINDINGS: No filling defects in the pulmonary arteries to suggest pulmonary emboli. Heart is upper limits normal in size. Mitral valve and aortic valve calcifications noted. No visible coronary artery calcifications. Scattered calcifications in the aortic arch. No evidence of aortic aneurysm or dissection.  Review of lung windows demonstrates  linear areas of atelectasis in the lung bases and lingula bilaterally. Otherwise no confluent opacities. No pleural effusions.  No mediastinal, hilar, or axillary adenopathy. Chest wall soft tissues are unremarkable. Imaging into the upper abdomen shows no acute findings. Defined nodular appearance to the liver contours suggest cirrhosis.  Review of the MIP images confirms the above findings.  IMPRESSION: No evidence of pulmonary embolus.  No acute findings within the chest.  Suspect cirrhosis.   Electronically Signed   By: KRolm BaptiseM.D.   On: 06/14/2013 21:47    EKG Interpretation    Date/Time:  Sunday June 14 2013 17:44:05 EST Ventricular Rate:  94 PR Interval:  158 QRS Duration: 93 QT Interval:  381 QTC Calculation: 476 R Axis:   44 Text Interpretation:  Sinus rhythm Anterior infarct, old No significant change since last tracing Confirmed by DOCHERTY  MD, [place]([id number] on 06/14/2013 5:49:20 PM            MDM   1. Atypical chest pain    Pt is a 58y.o. female with Pmhx as above who presents with intermittent CP for about 1 year, now with constant L sided CP for 4 days with SOB.  She has had 2-3 months of RLE swelling, is being treated for cellulitis and had a negative DVT study at the end of last month.  On PE, VSS, pt in NAD.  +ttp L chest.  Cardiopulm exam otherwise benign. She has increased soft tissue induration with chronic overlying hyperpigmentation of skin.  No overlying warmth, redness, or palpable cords.  EKG w/o ischemic changes.  D-dimer was elevated which prompted CT angio chest which was negtive. W/u with negative delta troponin. Given constant timing of pain for several days with negative troponin, I feel pain unlikely to be ischemic.  Have spoken with Cardiology who will assist w/ expediated f/u with Dr. VIrish Lack  Will rec pt call PCP tomorrow for f/u. Will order outp DVT study for tomorrow. Will not start lovenox given her thrombocytopenia. Have rec scheduled  ibuprofen.        Neta Ehlers, MD 06/15/13 [phone]

## 2013-06-15 ENCOUNTER — Ambulatory Visit (HOSPITAL_COMMUNITY)
Admission: RE | Admit: 2013-06-15 | Discharge: 2013-06-15 | Disposition: A | Discharge status: Home / Self Care | Payer: BC Managed Care – PPO | Source: Ambulatory Visit | Attending: Emergency Medicine | Admitting: Emergency Medicine

## 2013-06-15 DIAGNOSIS — R609 Edema, unspecified: Secondary | ICD-10-CM

## 2013-06-15 DIAGNOSIS — L02419 Cutaneous abscess of limb, unspecified: Secondary | ICD-10-CM | POA: Insufficient documentation

## 2013-06-15 DIAGNOSIS — L03119 Cellulitis of unspecified part of limb: Principal | ICD-10-CM

## 2013-06-15 NOTE — Progress Notes (Signed)
*  PRELIMINARY RESULTS* Vascular Ultrasound Right lower extremity venous duplex has been completed.  Preliminary findings: no evidence of DVT.  Consistent with previous study 05/18/13.  Landry Mellow, RDMS, RVT  06/15/2013, 9:08 AM

## 2013-06-15 NOTE — Discharge Instructions (Signed)
Chest Pain (Nonspecific) Chest pain has many causes. Your pain could be caused by something serious, such as a heart attack or a blood clot in the lungs. It could also be caused by something less serious, such as a chest bruise or a virus. Follow up with your doctor. More lab tests or other studies may be needed to find the cause of your pain. Most of the time, nonspecific chest pain will improve within 2 to 3 days of rest and mild pain medicine. HOME CARE  For chest bruises, you may put ice on the sore area for 15-20 minutes, 03-04 times a day. Do this only if it makes you feel better.  Put ice in a plastic bag.  Place a towel between the skin and the bag.  Rest for the next 2 to 3 days.  Go back to work if the pain improves.  See your doctor if the pain lasts longer than 1 to 2 weeks.  Only take medicine as told by your doctor.  Quit smoking if you smoke. GET HELP RIGHT AWAY IF:   There is more pain or pain that spreads to the arm, neck, jaw, back, or belly (abdomen).  You have shortness of breath.  You cough more than usual or cough up blood.  You have very bad back or belly pain, feel sick to your stomach (nauseous), or throw up (vomit).  You have very bad weakness.  You pass out (faint).  You have a fever. Any of these problems may be serious and may be an emergency. Do not wait to see if the problems will go away. Get medical help right away. Call your local emergency services 911 in [place] Do not drive yourself to the hospital. MAKE SURE YOU:   Understand these instructions.  Will watch this condition.  Will get help right away if you or your child is not doing well or gets worse. Document Released: 10/10/2007 Document Revised: 07/16/2011 Document Reviewed: 10/10/2007 [facility] Patient Information 2014 [place], [place].

## 2013-06-25 ENCOUNTER — Ambulatory Visit: Payer: BC Managed Care – PPO | Admitting: Interventional Cardiology

## 2013-07-27 HISTORY — PX: LAPAROSCOPIC GASTRIC SLEEVE RESECTION: SHX5895

## 2013-11-20 ENCOUNTER — Other Ambulatory Visit: Payer: Self-pay | Admitting: Family Medicine

## 2013-11-20 DIAGNOSIS — R1032 Left lower quadrant pain: Secondary | ICD-10-CM

## 2013-12-11 ENCOUNTER — Ambulatory Visit
Admission: RE | Admit: 2013-12-11 | Discharge: 2013-12-11 | Disposition: A | Discharge status: Home / Self Care | Payer: BC Managed Care – PPO | Source: Ambulatory Visit | Attending: Family Medicine | Admitting: Family Medicine

## 2013-12-11 DIAGNOSIS — R1032 Left lower quadrant pain: Secondary | ICD-10-CM

## 2013-12-31 ENCOUNTER — Other Ambulatory Visit: Payer: Self-pay | Admitting: Family Medicine

## 2013-12-31 DIAGNOSIS — Z1231 Encounter for screening mammogram for malignant neoplasm of breast: Secondary | ICD-10-CM

## 2014-01-02 ENCOUNTER — Encounter: Payer: Self-pay | Admitting: Interventional Cardiology

## 2014-01-25 ENCOUNTER — Ambulatory Visit
Admission: RE | Admit: 2014-01-25 | Discharge: 2014-01-25 | Disposition: A | Discharge status: Home / Self Care | Payer: BC Managed Care – PPO | Source: Ambulatory Visit | Attending: Family Medicine | Admitting: Family Medicine

## 2014-01-25 DIAGNOSIS — Z1231 Encounter for screening mammogram for malignant neoplasm of breast: Secondary | ICD-10-CM

## 2014-03-29 DIAGNOSIS — Z9884 Bariatric surgery status: Secondary | ICD-10-CM | POA: Insufficient documentation

## 2014-12-16 ENCOUNTER — Other Ambulatory Visit: Payer: Self-pay | Admitting: Urology

## 2014-12-21 ENCOUNTER — Encounter (HOSPITAL_COMMUNITY): Payer: Self-pay | Admitting: *Deleted

## 2014-12-22 NOTE — H&P (Signed)
H&P  Chief Complaint: Left renal stone-PCP Dr. Leighton [name]  History of Present Illness: This is a 59 year old female who was seen by Whitney [name] and underwent CT scan for follow-up of liver disease at Whitney [name] which revealed a 6-7 mm stone at the left UPJ.  Skin distended distance was 11 cm with 560 and filled units.  It may have been visible on scout.  She was treated with Cipro for Klebsiella UTI a few months ago.  She's had a gastric bypass.  There is a history of diabetes.  Today, she's been having left UQ pain for a few days and gross hematuria for a couple of weeks. On KUB stone appears in left proximal ureter. She has liver disease, NASH, and low platelets. Unfortunately her platelet count is on 72k today.   Past Medical History  Diagnosis Date  . Depression   . OSA (obstructive sleep apnea)   . Asthma   . Sjogren's syndrome   . Cirrhosis   . Diabetes mellitus type II   . Hyperlipidemia   . Allergic rhinitis   . Fibromyalgia   . Rheumatic fever   . NASH (nonalcoholic steatohepatitis)   . Arthritis   . Heart murmur     1989 from rhuematic fever   Past Surgical History  Procedure Laterality Date  . Tonsillectomy    . Anterior and posterior repair      pubovaginal sling  . Hysteroscopy w/d&c N/A 12/11/2012    Procedure: DILATATION AND CURETTAGE /HYSTEROSCOPY;  Surgeon: Whitney [name]. Whitney Mellow, MD;  Location: [facility];  Service: Gynecology;  Laterality: N/A;    Home Medications:  No prescriptions prior to admission   Allergies:  Allergies  Allergen Reactions  . Doxycycline     (rosacea) hives   . Naprosyn [Naproxen]     rash    Family History  Problem Relation Age of Onset  . Alzheimer's disease Father   . Hip fracture Father   . Allergies Daughter   . Asthma Father   . Asthma Paternal Grandmother   . Asthma Daughter   . Rheum arthritis Mother   . Heart disease Father   . Heart disease Mother    Social History:  reports that she has never smoked. She does not have any  smokeless tobacco history on file. She reports that she drinks alcohol. She reports that she does not use illicit drugs.  ROS: A complete review of systems was performed.  All systems are negative except for pertinent findings as noted. ROS   Physical Exam:  Vital signs in last 24 hours: Filed Vitals:   12/23/14 0716  BP: 106/71  Pulse: 77  Temp: 99.1 F (37.3 C)  Resp: 16      General:  Alert and oriented, No acute distress HEENT: Normocephalic, atraumatic Neck: No JVD or lymphadenopathy Cardiovascular: Regular rate and rhythm Lungs: Regular rate and effort Abdomen: Soft, nontender, nondistended, no abdominal masses Back: No CVA tenderness Extremities: No edema Neurologic: Grossly intact  Laboratory Data:  No results found for this or any previous visit (from the past 24 hour(s)). No results found for this or any previous visit (from the past 240 hour(s)). Creatinine: No results for input(s): CREATININE in the last 168 hours.  Impression/Assessment/plan: Left proximal ureteral stone - I discussed with the patient the nature, potential benefits, risks and alternatives to left ESWL, including side effects of the proposed treatment, the likelihood of the patient achieving the goals of the procedure, and any potential problems that might  occur during the procedure or recuperation. Given that her platelet count is only 72k, she's going to be best served with ureteroscopy. We discussed the nature, potential benefits, risks and alternatives to ureteroscopy, including side effects of the proposed treatment, the likelihood of the patient achieving the goals of the procedure, and any potential problems that might occur during the procedure or recuperation. All questions answered. Patient elects to proceed. This will need to be arranged for another day with me or Dr. Janice [name].   Whitney [name]

## 2014-12-23 ENCOUNTER — Encounter (HOSPITAL_COMMUNITY)
Admission: RE | Disposition: A | Discharge status: Home / Self Care | Payer: Self-pay | Source: Ambulatory Visit | Attending: Urology

## 2014-12-23 ENCOUNTER — Ambulatory Visit (HOSPITAL_COMMUNITY)
Admission: RE | Admit: 2014-12-23 | Discharge: 2014-12-23 | Disposition: A | Discharge status: Home / Self Care | Payer: BC Managed Care – PPO | Source: Ambulatory Visit | Attending: Urology | Admitting: Urology

## 2014-12-23 ENCOUNTER — Encounter (HOSPITAL_COMMUNITY): Payer: Self-pay | Admitting: *Deleted

## 2014-12-23 ENCOUNTER — Ambulatory Visit (HOSPITAL_COMMUNITY): Payer: BC Managed Care – PPO

## 2014-12-23 DIAGNOSIS — M1990 Unspecified osteoarthritis, unspecified site: Secondary | ICD-10-CM | POA: Diagnosis not present

## 2014-12-23 DIAGNOSIS — M797 Fibromyalgia: Secondary | ICD-10-CM | POA: Insufficient documentation

## 2014-12-23 DIAGNOSIS — J45909 Unspecified asthma, uncomplicated: Secondary | ICD-10-CM | POA: Insufficient documentation

## 2014-12-23 DIAGNOSIS — E119 Type 2 diabetes mellitus without complications: Secondary | ICD-10-CM | POA: Diagnosis not present

## 2014-12-23 DIAGNOSIS — M3500 Sicca syndrome, unspecified: Secondary | ICD-10-CM | POA: Diagnosis not present

## 2014-12-23 DIAGNOSIS — D696 Thrombocytopenia, unspecified: Secondary | ICD-10-CM | POA: Insufficient documentation

## 2014-12-23 DIAGNOSIS — K7581 Nonalcoholic steatohepatitis (NASH): Secondary | ICD-10-CM | POA: Insufficient documentation

## 2014-12-23 DIAGNOSIS — N200 Calculus of kidney: Secondary | ICD-10-CM | POA: Insufficient documentation

## 2014-12-23 DIAGNOSIS — G4733 Obstructive sleep apnea (adult) (pediatric): Secondary | ICD-10-CM | POA: Diagnosis not present

## 2014-12-23 DIAGNOSIS — E785 Hyperlipidemia, unspecified: Secondary | ICD-10-CM | POA: Insufficient documentation

## 2014-12-23 DIAGNOSIS — Z5309 Procedure and treatment not carried out because of other contraindication: Secondary | ICD-10-CM | POA: Insufficient documentation

## 2014-12-23 DIAGNOSIS — K7460 Unspecified cirrhosis of liver: Secondary | ICD-10-CM | POA: Insufficient documentation

## 2014-12-23 HISTORY — DX: Cardiac murmur, unspecified: R01.1

## 2014-12-23 LAB — CBC WITH DIFFERENTIAL/PLATELET
BASOS ABS: 0 10*3/uL (ref 0.0–0.1)
BASOS PCT: 1 % (ref 0–1)
EOS ABS: 0.1 10*3/uL (ref 0.0–0.7)
Eosinophils Relative: 3 % (ref 0–5)
HCT: 36.2 % (ref 36.0–46.0)
HEMOGLOBIN: 11.8 g/dL — AB (ref 12.0–15.0)
LYMPHS ABS: 0.6 10*3/uL — AB (ref 0.7–4.0)
Lymphocytes Relative: 28 % (ref 12–46)
MCH: 30.2 pg (ref 26.0–34.0)
MCHC: 32.6 g/dL (ref 30.0–36.0)
MCV: 92.6 fL (ref 78.0–100.0)
Monocytes Absolute: 0.4 10*3/uL (ref 0.1–1.0)
Monocytes Relative: 17 % — ABNORMAL HIGH (ref 3–12)
NEUTROS PCT: 51 % (ref 43–77)
Neutro Abs: 1 10*3/uL — ABNORMAL LOW (ref 1.7–7.7)
Platelets: 72 10*3/uL — ABNORMAL LOW (ref 150–400)
RBC: 3.91 MIL/uL (ref 3.87–5.11)
RDW: 14.4 % (ref 11.5–15.5)
WBC: 2 10*3/uL — AB (ref 4.0–10.5)

## 2014-12-23 LAB — URINALYSIS, ROUTINE W REFLEX MICROSCOPIC
Glucose, UA: NEGATIVE mg/dL
Ketones, ur: NEGATIVE mg/dL
Nitrite: POSITIVE — AB
Protein, ur: 100 mg/dL — AB
SPECIFIC GRAVITY, URINE: 1.026 (ref 1.005–1.030)
UROBILINOGEN UA: 1 mg/dL (ref 0.0–1.0)
pH: 6 (ref 5.0–8.0)

## 2014-12-23 LAB — URINE MICROSCOPIC-ADD ON

## 2014-12-23 LAB — APTT: APTT: 40 s — AB (ref 24–37)

## 2014-12-23 LAB — GLUCOSE, CAPILLARY: Glucose-Capillary: 74 mg/dL (ref 65–99)

## 2014-12-23 LAB — PROTIME-INR
INR: 1.24 (ref 0.00–1.49)
PROTHROMBIN TIME: 15.8 s — AB (ref 11.6–15.2)

## 2014-12-23 SURGERY — LITHOTRIPSY, ESWL
Anesthesia: LOCAL | Laterality: Left

## 2014-12-23 MED ORDER — SODIUM CHLORIDE 0.9 % IV SOLN: INTRAVENOUS | Status: DC

## 2014-12-23 MED ORDER — DEXTROSE-NACL 5-0.45 % IV SOLN
INTRAVENOUS | Status: DC
  Administered 2014-12-23: 09:00:00 via INTRAVENOUS

## 2014-12-23 MED ORDER — CIPROFLOXACIN HCL 500 MG PO TABS
500.0000 mg | ORAL_TABLET | ORAL | Status: AC
  Administered 2014-12-23: 500 mg via ORAL
  Filled 2014-12-23: qty 1

## 2014-12-23 MED ORDER — DIAZEPAM 5 MG PO TABS
10.0000 mg | ORAL_TABLET | ORAL | Status: AC
  Administered 2014-12-23: 10 mg via ORAL
  Filled 2014-12-23: qty 2

## 2014-12-23 MED ORDER — DIPHENHYDRAMINE HCL 25 MG PO CAPS
25.0000 mg | ORAL_CAPSULE | ORAL | Status: AC
  Administered 2014-12-23: 25 mg via ORAL
  Filled 2014-12-23: qty 1

## 2014-12-23 NOTE — Progress Notes (Signed)
Patient here for lithotripsy. CBG 74. Dr. Junious Silk called and made aware. Order for IVF changed. Additional lab orders given from MD.

## 2014-12-23 NOTE — Progress Notes (Signed)
Dr. Junious Silk came to see patient in short stay prior to [facility]. Due to labs procedure is cancelled. Patient's granddaughter came to pick patient up at 1030. Patient aware that another procedure will be scheduled by urology.

## 2014-12-23 NOTE — Discharge Instructions (Signed)
Office will call you to reschedule procedure.    Call them if any problems or questions. MD   [phone]

## 2015-01-19 ENCOUNTER — Encounter (HOSPITAL_BASED_OUTPATIENT_CLINIC_OR_DEPARTMENT_OTHER): Payer: Self-pay | Admitting: *Deleted

## 2015-01-19 ENCOUNTER — Other Ambulatory Visit: Payer: Self-pay | Admitting: Urology

## 2015-01-19 NOTE — Progress Notes (Signed)
NPO AFTER MN.  ARRIVE AT 1000.  NEEDS EKG.  CURRENT CBC IN CHART AND EPIC.  REVIEWING CHART W/ MDA, IF ANY LAB WORK TO BE DONE DUE TO CIRRHOSIS AND ABNORMAL PT/INR AND PTT IN [place].  WILL TAKE PROTONIX AM DOS W/ SIPS OF WATER.

## 2015-01-20 NOTE — H&P (Signed)
Reason For Visit    History of Present Illness Follow-up-PCP Dr. Drema Dallas.    1-nephrolithiasis-patient was seen June 2016. She'd undergone CT scan for follow-up hepatic disease at [facility] revealed a 6 mm stone in the left renal pelvis with mild thickening of the wall of the renal pelvis and also a stone in the lower pole of the left kidney. She has mild left flank discomfort. She was told that she had a kidney stone in the past but nothing was done about it. She voids well. Her urine was dark 6 weeks ago and she was treated with Cipro for Klebsiella UTI. The urine cleared up after Cipro. She had gastric sleeve surgery and has lost 100 lbs since March 2015.     I had seen her for shockwave lithotripsy August 2016. Unfortunately her platelets were only 72,000. She has NASH. She is having some gross hematuria and left upper quadrant pain and on KUB it appeared the stone has progressed in the left proximal ureter.  Her coags were fairly normal. I did send a urine culture which was not sent. It is very frustrating at times. Also, I set her up for ureteroscopy but she did not schedule it.    Today, she is well. She's had some gross hematuria. She's had some left flank pain. She has not seen a stone pass.    UA to numerous count red cells and some bacteria. I sent it for culture.    KUB reveals stable left proximal ureteral opacity. Difficult KUB to read due to bowel gas.     Past Medical History Problems  1. History of cardiac murmur (Z86.79) 2. History of diabetes mellitus (Z86.39) 3. History of hepatic disease (Z87.19)  Surgical History Problems  1. History of Bladder Surgery 2. History of Gastric Surgery For Morbid Obesity Laparoscopic Longitudinal Gastrectomy 3. History of Tonsillectomy  Current Meds 1. Biotin CAPS;  Therapy: (Recorded:02Jun2016) to Recorded 2. Colace CAPS;  Therapy: (Recorded:02Jun2016) to Recorded 3. Iron TABS;  Therapy: (Recorded:02Jun2016) to  Recorded 4. MetFORMIN HCl - 500 MG Oral Tablet;  Therapy: (Recorded:02Jun2016) to Recorded 5. Protonix 20 MG Oral Tablet Delayed Release;  Therapy: (Recorded:02Jun2016) to Recorded 6. Vitamin B-12 TABS;  Therapy: (Recorded:02Jun2016) to Recorded 7. Wellbutrin SR 150 MG TBCR;  Therapy: (Recorded:02Jun2016) to Recorded 8. Zetia 10 MG Oral Tablet;  Therapy: (Recorded:02Jun2016) to Recorded  Allergies Medication  1. Doxycycline Hyclate CAPS  Family History Problems  1. Family history of Alzheimer's disease (Z82.0) : Father 2. Family history of NASH (nonalcoholic steatohepatitis) : Mother  Social History Problems  1. Denied: History of Alcohol use 2. Caffeine use (F15.90) 3. Father deceased   62 Alzheimers 4. Married 5. Mother deceased   5 NASH 40. Never a smoker 7. Number of children   1 son / 1 daughter  Vitals Vital Signs [Data Includes: Last 1 Day]  Recorded: 13Sep2016 04:15PM  Blood Pressure: 120 / 80 Temperature: 98.6 F Heart Rate: 77  Results/Data Urine [Data Includes: Last 1 Day]   13Sep2016  COLOR BLOODY   APPEARANCE CLOUDY   SPECIFIC GRAVITY 1.025   pH 6.0   GLUCOSE NEGATIVE   BILIRUBIN NEGATIVE   KETONE TRACE   BLOOD 3+   PROTEIN 2+   NITRITE POSITIVE   LEUKOCYTE ESTERASE 2+   SQUAMOUS EPITHELIAL/HPF 0-5 HPF  WBC 20-40 WBC/HPF  RBC PACKED RBC/HPF  BACTERIA MANY HPF  CRYSTALS NONE SEEN HPF  CASTS NONE SEEN LPF  Yeast NONE SEEN HPF   Old records or  history reviewed: dr Janice Norrie notes.  The following images/tracing/specimen were independently visualized:  CT, kub x 2.    Procedure KUB-comparison to prior CT and KUB, findings: There is an apparent density in the left proximal ureter at the level of L3 where the stone was noted prior. The kidneys are obscured by bowel gas. The bowel gas pattern appeared normal. The bones appeared normal.     Assessment Assessed  1. Left ureteral stone (N20.1) 2. Microhematuria (R31.2)  Plan  Health  Maintenance  1. UA With REFLEX; [Do Not Release]; Status:Complete;   Done: [id number] 04:05PM Left ureteral stone  2. Follow-up Schedule Surgery Office  Follow-up  Status: Hold For - Appointment   Requested for: 13Sep2016 3. KUB; Status:Resulted - Requires Verification;   Done: [id number] 04:22PM  Discussion/Summary Left ureteral stone-we went over the nature risks benefits and alternatives to left ureteroscopy. After pictures the anatomy. We discussed risk of bleeding infection and ureteral injury among others. Discussed as well need for staged procedure or pre-stenting. All questions answered. She elects to proceed. Another advantage if cystoscopy is we will be able to evaluate the bladder. She did have a CT scan with delayed images. No worrisome findings.     Signatures Electronically signed by : Festus Aloe, M.D.; Jan 18 2015  5:00PM EST  Add: Culture pending

## 2015-01-21 ENCOUNTER — Ambulatory Visit (HOSPITAL_BASED_OUTPATIENT_CLINIC_OR_DEPARTMENT_OTHER): Payer: BC Managed Care – PPO | Admitting: Anesthesiology

## 2015-01-21 ENCOUNTER — Ambulatory Visit (HOSPITAL_COMMUNITY): Payer: BC Managed Care – PPO

## 2015-01-21 ENCOUNTER — Ambulatory Visit (HOSPITAL_BASED_OUTPATIENT_CLINIC_OR_DEPARTMENT_OTHER)
Admission: RE | Admit: 2015-01-21 | Discharge: 2015-01-21 | Disposition: A | Discharge status: Home / Self Care | Payer: BC Managed Care – PPO | Source: Ambulatory Visit | Attending: Urology | Admitting: Urology

## 2015-01-21 ENCOUNTER — Encounter (HOSPITAL_BASED_OUTPATIENT_CLINIC_OR_DEPARTMENT_OTHER)
Admission: RE | Disposition: A | Discharge status: Home / Self Care | Payer: Self-pay | Source: Ambulatory Visit | Attending: Urology

## 2015-01-21 ENCOUNTER — Encounter (HOSPITAL_BASED_OUTPATIENT_CLINIC_OR_DEPARTMENT_OTHER): Payer: Self-pay

## 2015-01-21 DIAGNOSIS — Z79899 Other long term (current) drug therapy: Secondary | ICD-10-CM | POA: Diagnosis not present

## 2015-01-21 DIAGNOSIS — K759 Inflammatory liver disease, unspecified: Secondary | ICD-10-CM | POA: Diagnosis not present

## 2015-01-21 DIAGNOSIS — K7581 Nonalcoholic steatohepatitis (NASH): Secondary | ICD-10-CM | POA: Diagnosis not present

## 2015-01-21 DIAGNOSIS — K219 Gastro-esophageal reflux disease without esophagitis: Secondary | ICD-10-CM | POA: Insufficient documentation

## 2015-01-21 DIAGNOSIS — E119 Type 2 diabetes mellitus without complications: Secondary | ICD-10-CM | POA: Diagnosis not present

## 2015-01-21 DIAGNOSIS — Z87442 Personal history of urinary calculi: Secondary | ICD-10-CM | POA: Insufficient documentation

## 2015-01-21 DIAGNOSIS — Z9884 Bariatric surgery status: Secondary | ICD-10-CM | POA: Diagnosis not present

## 2015-01-21 DIAGNOSIS — F329 Major depressive disorder, single episode, unspecified: Secondary | ICD-10-CM | POA: Diagnosis not present

## 2015-01-21 DIAGNOSIS — K449 Diaphragmatic hernia without obstruction or gangrene: Secondary | ICD-10-CM | POA: Diagnosis not present

## 2015-01-21 DIAGNOSIS — N132 Hydronephrosis with renal and ureteral calculous obstruction: Secondary | ICD-10-CM | POA: Diagnosis not present

## 2015-01-21 DIAGNOSIS — R109 Unspecified abdominal pain: Secondary | ICD-10-CM | POA: Diagnosis present

## 2015-01-21 HISTORY — DX: Gastro-esophageal reflux disease without esophagitis: K21.9

## 2015-01-21 HISTORY — DX: Decreased white blood cell count, unspecified: D72.819

## 2015-01-21 HISTORY — DX: Nonrheumatic aortic (valve) stenosis: I35.0

## 2015-01-21 HISTORY — DX: Personal history of other medical treatment: Z92.89

## 2015-01-21 HISTORY — DX: Personal history of other diseases of the circulatory system: Z86.79

## 2015-01-21 HISTORY — DX: Thrombocytopenia, unspecified: D69.6

## 2015-01-21 HISTORY — DX: Personal history of other diseases of the digestive system: Z87.19

## 2015-01-21 HISTORY — PX: CYSTOSCOPY WITH RETROGRADE PYELOGRAM, URETEROSCOPY AND STENT PLACEMENT: SHX5789

## 2015-01-21 LAB — GLUCOSE, CAPILLARY: Glucose-Capillary: 78 mg/dL (ref 65–99)

## 2015-01-21 SURGERY — CYSTOURETEROSCOPY, WITH RETROGRADE PYELOGRAM AND STENT INSERTION
Anesthesia: General | Laterality: Left

## 2015-01-21 MED ORDER — LACTATED RINGERS IV SOLN
INTRAVENOUS | Status: DC
  Administered 2015-01-21: 11:00:00 via INTRAVENOUS
  Filled 2015-01-21: qty 1000

## 2015-01-21 MED ORDER — CEFAZOLIN SODIUM-DEXTROSE 2-3 GM-% IV SOLR
INTRAVENOUS | Status: AC
  Filled 2015-01-21: qty 50

## 2015-01-21 MED ORDER — CEPHALEXIN 250 MG PO CAPS: ORAL_CAPSULE | ORAL | Status: DC

## 2015-01-21 MED ORDER — FENTANYL CITRATE (PF) 100 MCG/2ML IJ SOLN
INTRAMUSCULAR | Status: DC | PRN
  Administered 2015-01-21: 50 ug via INTRAVENOUS

## 2015-01-21 MED ORDER — LIDOCAINE HCL (CARDIAC) 20 MG/ML IV SOLN
INTRAVENOUS | Status: DC | PRN
  Administered 2015-01-21: 100 mg via INTRAVENOUS

## 2015-01-21 MED ORDER — MIDAZOLAM HCL 5 MG/5ML IJ SOLN
INTRAMUSCULAR | Status: DC | PRN
  Administered 2015-01-21: 2 mg via INTRAVENOUS

## 2015-01-21 MED ORDER — PROPOFOL 10 MG/ML IV BOLUS
INTRAVENOUS | Status: DC | PRN
  Administered 2015-01-21: 200 mg via INTRAVENOUS

## 2015-01-21 MED ORDER — IOHEXOL 350 MG/ML SOLN
INTRAVENOUS | Status: DC | PRN
  Administered 2015-01-21: 5 mL via URETHRAL

## 2015-01-21 MED ORDER — EPHEDRINE SULFATE 50 MG/ML IJ SOLN
INTRAMUSCULAR | Status: DC | PRN
  Administered 2015-01-21 (×2): 10 mg via INTRAVENOUS

## 2015-01-21 MED ORDER — ONDANSETRON HCL 4 MG/2ML IJ SOLN
INTRAMUSCULAR | Status: DC | PRN
  Administered 2015-01-21: 4 mg via INTRAVENOUS

## 2015-01-21 MED ORDER — TAMSULOSIN HCL 0.4 MG PO CAPS: 0.4000 mg | ORAL_CAPSULE | Freq: Every day | ORAL | Status: DC

## 2015-01-21 MED ORDER — MIDAZOLAM HCL 2 MG/2ML IJ SOLN
INTRAMUSCULAR | Status: AC
  Filled 2015-01-21: qty 2

## 2015-01-21 MED ORDER — OXYCODONE HCL 5 MG PO TABS: 5.0000 mg | ORAL_TABLET | Freq: Three times a day (TID) | ORAL | Status: DC | PRN

## 2015-01-21 MED ORDER — CEFAZOLIN SODIUM 1-5 GM-% IV SOLN
1.0000 g | INTRAVENOUS | Status: DC
  Filled 2015-01-21: qty 50

## 2015-01-21 MED ORDER — CEFAZOLIN SODIUM-DEXTROSE 2-3 GM-% IV SOLR
2.0000 g | INTRAVENOUS | Status: AC
  Administered 2015-01-21: 2 g via INTRAVENOUS
  Filled 2015-01-21: qty 50

## 2015-01-21 MED ORDER — STERILE WATER FOR IRRIGATION IR SOLN
Status: DC | PRN
  Administered 2015-01-21: 3000 mL via INTRAVESICAL

## 2015-01-21 MED ORDER — FENTANYL CITRATE (PF) 100 MCG/2ML IJ SOLN
INTRAMUSCULAR | Status: AC
  Filled 2015-01-21: qty 4

## 2015-01-21 SURGICAL SUPPLY — 24 items
ADAPTER CATH URET PLST 4-6FR (CATHETERS) IMPLANT
ADPR CATH URET STRL DISP 4-6FR (CATHETERS)
BAG DRAIN URO-CYSTO SKYTR STRL (DRAIN) ×3 IMPLANT
BAG DRN UROCATH (DRAIN) ×1
CANISTER SUCT LVC 12 LTR MEDI- (MISCELLANEOUS) IMPLANT
CATH INTERMIT  6FR 70CM (CATHETERS) IMPLANT
CATH URET 5FR 28IN CONE TIP (BALLOONS)
CATH URET 5FR 28IN OPEN ENDED (CATHETERS) IMPLANT
CATH URET 5FR 70CM CONE TIP (BALLOONS) IMPLANT
CATH URET DUAL LUMEN 6-10FR 50 (CATHETERS) IMPLANT
CLOTH BEACON ORANGE TIMEOUT ST (SAFETY) ×3 IMPLANT
FIBER LASER TRAC TIP (UROLOGICAL SUPPLIES) IMPLANT
GLOVE BIO SURGEON STRL SZ7.5 (GLOVE) ×9 IMPLANT
GOWN STRL REUS W/ TWL LRG LVL3 (GOWN DISPOSABLE) IMPLANT
GOWN STRL REUS W/ TWL XL LVL3 (GOWN DISPOSABLE) ×1 IMPLANT
GOWN STRL REUS W/TWL LRG LVL3 (GOWN DISPOSABLE) ×3 IMPLANT
GOWN STRL REUS W/TWL XL LVL3 (GOWN DISPOSABLE) ×3
GUIDEWIRE 0.038 PTFE COATED (WIRE) IMPLANT
GUIDEWIRE ANG ZIPWIRE 038X150 (WIRE) IMPLANT
GUIDEWIRE STR DUAL SENSOR (WIRE) ×3 IMPLANT
IV NS IRRIG 3000ML ARTHROMATIC (IV SOLUTION) ×6 IMPLANT
MANIFOLD NEPTUNE II (INSTRUMENTS) IMPLANT
PACK CYSTO (CUSTOM PROCEDURE TRAY) ×3 IMPLANT
STENT URET 6FRX24 CONTOUR (STENTS) ×3 IMPLANT

## 2015-01-21 NOTE — Transfer of Care (Signed)
Immediate Anesthesia Transfer of Care Note  Patient: Whitney [name]  Procedure(s) Performed: Procedure(s): CYSTOSCOPY WITH LEFT  RETROGRADE PYELOGRAM, LEFT URETEROSCOPY AND STENT PLACEMENT (Left)  Patient Location: PACU  Anesthesia Type:General  Level of Consciousness: awake, alert  and oriented  Airway & Oxygen Therapy: Patient Spontanous Breathing and Patient connected to nasal cannula oxygen  Post-op Assessment: Report given to RN  Post vital signs: Reviewed and stable  Last Vitals:  Filed Vitals:   01/21/15 1215  BP:   Pulse:   Temp: 36.5 C  Resp:     Complications: No apparent anesthesia complications

## 2015-01-21 NOTE — Discharge Instructions (Signed)
Ureteral Stent Implantation, Care After Refer to this sheet in the next few weeks. These instructions provide you with information on caring for yourself after your procedure. Your health care provider may also give you more specific instructions. Your treatment has been planned according to current medical practices, but problems sometimes occur. Call your health care provider if you have any problems or questions after your procedure. WHAT TO EXPECT AFTER THE PROCEDURE You should be back to normal activity within 48 hours after the procedure. Nausea and vomiting may occur and are commonly the result of anesthesia. It is common to experience sharp pain in the back or lower abdomen and penis with voiding. This is caused by movement of the ends of the stent with the act of urinating.It usually goes away within minutes after you have stopped urinating. HOME CARE INSTRUCTIONS Make sure to drink plenty of fluids. You may have small amounts of bleeding, causing your urine to be red. This is normal. Certain movements may trigger pain or a feeling that you need to urinate. You may be given medicines to prevent infection or bladder spasms. Be sure to take all medicines as directed. Only take over-the-counter or prescription medicines for pain, discomfort, or fever as directed by your health care provider. Do not take aspirin, as this can make bleeding worse. Your stent will be left in until the kidney stone is removed. Be sure to keep all follow-up appointments so your health care provider can check that you are healing properly.  PROBLEMS YOU SHOULD REPORT TO [place]:  Fevers over 100.5 Fahrenheit.  Heavy bleeding, or clots ( See above notes about blood in urine ).  Inability to urinate.  Drug reactions ( hives, rash, nausea, vomiting, diarrhea ).  Severe burning or pain with urination that is not improving.   [facility] [phone] Post Ureteroscopy With or Without Stent  Instructions  Definitions:  Ureter: The duct that transports urine from the kidney to the bladder. Stent:   A plastic hollow tube that is placed into the ureter, from the kidney to the                 bladder to prevent the ureter from swelling shut.  GENERAL INSTRUCTIONS:  Despite the fact that no skin incisions were used, the area around the ureter and bladder is raw and irritated. The stent is a foreign body which will further irritate the bladder wall. This irritation is manifested by increased frequency of urination, both day and night, and by an increase in the urge to urinate. In some, the urge to urinate is present almost always. Sometimes the urge is strong enough that you may not be able to stop yourself from urinating. The only real cure is to remove the stent and then give time for the bladder wall to heal which can't be done until the danger of the ureter swelling shut has passed, which varies.  You may see some blood in your urine while the stent is in place and a few days afterwards. Do not be alarmed, even if the urine was clear for a while. Get off your feet and drink lots of fluids until clearing occurs. If you start to pass clots or don't improve, call us.  DIET: You may return to your normal diet immediately. Because of the raw surface of your bladder, alcohol, spicy foods, acid type foods and drinks with caffeine may cause irritation or frequency and should be used in moderation. To keep your urine  flowing freely and to avoid constipation, drink plenty of fluids during the day ( 8-10 glasses ). Tip: Avoid cranberry juice because it is very acidic.  ACTIVITY: Your physical activity doesn't need to be restricted. However, if you are very active, you may see some blood in your urine. We suggest that you reduce your activity under these circumstances until the bleeding has stopped.  BOWELS: It is important to keep your bowels regular during the postoperative period. Straining  with bowel movements can cause bleeding. A bowel movement every other day is reasonable. Use a mild laxative if needed, such as Milk of Magnesia 2-3 tablespoons, or 2 Dulcolax tablets. Call if you continue to have problems. If you have been taking narcotics for pain, before, during or after your surgery, you may be constipated. Take a laxative if necessary.   MEDICATION: You should resume your pre-surgery medications unless told not to. In addition you will often be given an antibiotic to prevent infection. These should be taken as prescribed until the bottles are finished unless you are having an unusual reaction to one of the drugs.  FOLLOW-UP: You will need a follow-up appointment to monitor your progress. Call for this appointment at the number listed above. Usually the first appointment will be about three to fourteen days after your surgery.      Post Anesthesia Home Care Instructions  Activity: Get plenty of rest for the remainder of the day. A responsible adult should stay with you for 24 hours following the procedure.  For the next 24 hours, DO NOT: -Drive a car -Paediatric nurse -Drink alcoholic beverages -Take any medication unless instructed by your physician -Make any legal decisions or sign important papers.  Meals: Start with liquid foods such as gelatin or soup. Progress to regular foods as tolerated. Avoid greasy, spicy, heavy foods. If nausea and/or vomiting occur, drink only clear liquids until the nausea and/or vomiting subsides. Call your physician if vomiting continues.  Special Instructions/Symptoms: Your throat may feel dry or sore from the anesthesia or the breathing tube placed in your throat during surgery. If this causes discomfort, gargle with warm salt water. The discomfort should disappear within 24 hours.  If you had a scopolamine patch placed behind your ear for the management of post- operative nausea and/or vomiting:  1. The medication in the patch  is effective for 72 hours, after which it should be removed.  Wrap patch in a tissue and discard in the trash. Wash hands thoroughly with soap and water. 2. You may remove the patch earlier than 72 hours if you experience unpleasant side effects which may include dry mouth, dizziness or visual disturbances. 3. Avoid touching the patch. Wash your hands with soap and water after contact with the patch.

## 2015-01-21 NOTE — Interval H&P Note (Signed)
History and Physical Interval Note:  01/21/2015 11:27 AM  Whitney [name]  has presented today for surgery, with the diagnosis of LEFT URETERAL AND RENAL STONES   The various methods of treatment have been discussed with the patient and family. After consideration of risks, benefits and other options for treatment, the patient has consented to  Procedure(s): CYSTOSCOPY WITH LEFT  RETROGRADE PYELOGRAM, LEFT URETEROSCOPY AND STENT PLACEMENT (Left) HOLMIUM LASER APPLICATION (Left) as a surgical intervention .  The patient's history has been reviewed, patient examined, no change in status, stable for surgery.  I have reviewed the patient's chart and labs.  Questions were answered to the patient's satisfaction. Patient's urine culture returned this morning (it was just sent Wednesday) and is positive for Klebsiella pansensitive. I told her we could not do the ureteroscopy today. I gave her the option of rescheduling so that we could do the procedure in one setting of possible. 1 risk with that would be if she develops symptoms and or pyelonephritis. We discussed proceeding today with cystoscopy and left retrograde pyelogram. If normal she may have passed the stone and may not need any further procedure. She did pass a "clump" of material the other day. If retrograde and scout imaging reveals the stone is still present she will need ureteroscopy and we discussed the rationale for a staged procedure especially in the face of positive urine culture. All questions answered. She elects to proceed with possible staged procedure.   ESKRIDGE, MATTHEW

## 2015-01-21 NOTE — Anesthesia Postprocedure Evaluation (Signed)
  Anesthesia Post-op Note  Patient: Whitney [name]  Procedure(s) Performed: Procedure(s) (LRB): CYSTOSCOPY WITH LEFT  RETROGRADE PYELOGRAM, LEFT URETEROSCOPY AND STENT PLACEMENT (Left)  Patient Location: PACU  Anesthesia Type: General  Level of Consciousness: awake and alert   Airway and Oxygen Therapy: Patient Spontanous Breathing  Post-op Pain: mild  Post-op Assessment: Post-op Vital signs reviewed, Patient's Cardiovascular Status Stable, Respiratory Function Stable, Patent Airway and No signs of Nausea or vomiting  Last Vitals:  Filed Vitals:   01/21/15 1245  BP: 110/62  Pulse: 67  Temp:   Resp: 14    Post-op Vital Signs: stable   Complications: No apparent anesthesia complications

## 2015-01-21 NOTE — Anesthesia Procedure Notes (Signed)
Procedure Name: LMA Insertion Date/Time: 01/21/2015 11:48 AM Performed by: Bethena Roys T Pre-anesthesia Checklist: Patient identified, Emergency Drugs available, Suction available and Patient being monitored Patient Re-evaluated:Patient Re-evaluated prior to inductionOxygen Delivery Method: Circle System Utilized Preoxygenation: Pre-oxygenation with 100% oxygen Intubation Type: IV induction Ventilation: Mask ventilation without difficulty LMA: LMA inserted LMA Size: 4.0 Number of attempts: 1 Airway Equipment and Method: Bite block Placement Confirmation: positive ETCO2 Tube secured with: Tape Dental Injury: Teeth and Oropharynx as per pre-operative assessment

## 2015-01-21 NOTE — Op Note (Signed)
Preoperative diagnosis: Left renal stone Postoperative diagnosis: Left renal stone Procedure: Cystoscopy, left retrograde pyelogram, left ureteral stent placement  Surgeon: Junious Silk  Anesthesia: Gen.  Indication for procedure: 59 year old female with liver disease underwent CT scan. This revealed a left UPJ stone. She was originally set up for shockwave but I and are was out just a little bit and her platelets were low. She was set up for ureteroscopy but her urine culture grew greater than 100,000 Klebsiella. Therefore she will underwent just stenting today.  Findings: Filling defect in the renal pelvis consistent with stone. Looking back on KUB the stone remains in the UPJ. There is some mild hydronephrosis. I suspect the stone is intermittently obstructing her UPJ.  The bladder appeared normal apart from some bullous edema around the bladder neck especially anterior consistent with some cystitis cystica. There were no papillary tumors. There were no stones or foreign bodies in the bladder. The trigone and ureteral orifices were in their normal orthotopic position.  Left retrograde pyelogram-this outlined a single ureter single collecting system unit. The ureter was normal without filling defect, stricture or dilation. In the renal pelvis there was a filling defect consistent with the stone as seen on prior CT with some mild dilation of the infundibula but the calyces were still sharp. There were no other filling defects.  Description of procedure: After consent was obtained patient brought to the operating room. After adequate anesthesia she is placed in lithotomy position and prepped and draped in the usual sterile fashion. The bladder was inspected with the 30 and 70 lens.  Description of procedure: After consent was obtained patient brought to the operating room. After adequate anesthesia she is placed in lithotomy position and prepped and draped in the usual sterile fashion. A timeout was  performed to confirm the patient and procedure. The cystoscope was passed per urethra and the bladder inspected with the 30 and 70 lens. Scout imaging was obtained. The stone was noted in the region of the left proximal ureter. The left ureteral orifice was cannulated with a 6 [place] open-ended catheter and retrograde injection of contrast was performed. This outlined as above with filling defect in the renal pelvis consistent with the stone. 6 x 24 cm stent was advanced and the wire was was removed. There was good coil in the upper pole collecting system and a good coil in the bladder. The bladder was drained and the scope removed she was awakened and taken to recovery room in stable condition.  Complications: None Blood loss: Minimal  Specimens: None  Drains: 6 x 24 cm left ureteral stent  Disposition: Patient stable to PACU.

## 2015-01-21 NOTE — Anesthesia Preprocedure Evaluation (Signed)
Anesthesia Evaluation  Patient identified by MRN, date of birth, ID band Patient awake    Reviewed: Allergy & Precautions, NPO status , Patient's Chart, lab work & pertinent test results  Airway Mallampati: II  TM Distance: >3 FB Neck ROM: Full    Dental   Pulmonary neg pulmonary ROS,    breath sounds clear to auscultation       Cardiovascular (-) angina(-) DOE + Valvular Problems/Murmurs (Moderate AS with normal EF on 05/2012) AS  Rhythm:Regular Rate:Normal + Systolic murmurs    Neuro/Psych Depression negative neurological ROS     GI/Hepatic hiatal hernia, GERD  ,(+) Hepatitis - (NASH)  Endo/Other  diabetes, Type 2, Oral Hypoglycemic Agents  Renal/GU Renal disease     Musculoskeletal  (+) Arthritis ,   Abdominal   Peds  Hematology   Anesthesia Other Findings   Reproductive/Obstetrics                             Anesthesia Physical Anesthesia Plan  ASA: II  Anesthesia Plan: General   Post-op Pain Management:    Induction: Intravenous  Airway Management Planned: LMA  Additional Equipment:   Intra-op Plan:   Post-operative Plan:   Informed Consent: I have reviewed the patients History and Physical, chart, labs and discussed the procedure including the risks, benefits and alternatives for the proposed anesthesia with the patient or authorized representative who has indicated his/her understanding and acceptance.     Plan Discussed with: CRNA  Anesthesia Plan Comments:         Anesthesia Quick Evaluation

## 2015-01-24 ENCOUNTER — Encounter (HOSPITAL_BASED_OUTPATIENT_CLINIC_OR_DEPARTMENT_OTHER): Payer: Self-pay | Admitting: Urology

## 2015-01-26 ENCOUNTER — Other Ambulatory Visit: Payer: Self-pay | Admitting: Urology

## 2015-02-10 ENCOUNTER — Encounter (HOSPITAL_BASED_OUTPATIENT_CLINIC_OR_DEPARTMENT_OTHER): Payer: Self-pay | Admitting: *Deleted

## 2015-02-11 ENCOUNTER — Encounter (HOSPITAL_BASED_OUTPATIENT_CLINIC_OR_DEPARTMENT_OTHER): Payer: Self-pay | Admitting: *Deleted

## 2015-02-11 NOTE — Progress Notes (Signed)
Pt instructed npo p mn 10/10 x protonix, welbutrin, w sip of water. Ok to take pain med w sip of water if needed. To [facility] 10/11 @ 0730.  Needs istat on arrival.

## 2015-02-15 ENCOUNTER — Encounter (HOSPITAL_BASED_OUTPATIENT_CLINIC_OR_DEPARTMENT_OTHER)
Admission: RE | Disposition: A | Discharge status: Home / Self Care | Payer: Self-pay | Source: Ambulatory Visit | Attending: Urology

## 2015-02-15 ENCOUNTER — Ambulatory Visit (HOSPITAL_BASED_OUTPATIENT_CLINIC_OR_DEPARTMENT_OTHER)
Admission: RE | Admit: 2015-02-15 | Discharge: 2015-02-15 | Disposition: A | Discharge status: Home / Self Care | Payer: BC Managed Care – PPO | Source: Ambulatory Visit | Attending: Urology | Admitting: Urology

## 2015-02-15 ENCOUNTER — Encounter (HOSPITAL_BASED_OUTPATIENT_CLINIC_OR_DEPARTMENT_OTHER): Payer: Self-pay | Admitting: Anesthesiology

## 2015-02-15 DIAGNOSIS — N201 Calculus of ureter: Secondary | ICD-10-CM | POA: Insufficient documentation

## 2015-02-15 DIAGNOSIS — Z79899 Other long term (current) drug therapy: Secondary | ICD-10-CM | POA: Diagnosis not present

## 2015-02-15 DIAGNOSIS — Z7984 Long term (current) use of oral hypoglycemic drugs: Secondary | ICD-10-CM | POA: Diagnosis not present

## 2015-02-15 DIAGNOSIS — Z87442 Personal history of urinary calculi: Secondary | ICD-10-CM | POA: Diagnosis not present

## 2015-02-15 DIAGNOSIS — E119 Type 2 diabetes mellitus without complications: Secondary | ICD-10-CM | POA: Diagnosis not present

## 2015-02-15 DIAGNOSIS — N390 Urinary tract infection, site not specified: Secondary | ICD-10-CM | POA: Diagnosis not present

## 2015-02-15 DIAGNOSIS — Z5309 Procedure and treatment not carried out because of other contraindication: Secondary | ICD-10-CM | POA: Diagnosis not present

## 2015-02-15 DIAGNOSIS — R310 Gross hematuria: Secondary | ICD-10-CM | POA: Diagnosis present

## 2015-02-15 LAB — URINE MICROSCOPIC-ADD ON

## 2015-02-15 LAB — URINALYSIS, ROUTINE W REFLEX MICROSCOPIC
GLUCOSE, UA: NEGATIVE mg/dL
KETONES UR: 15 mg/dL — AB
Nitrite: POSITIVE — AB
PH: 6 (ref 5.0–8.0)
Protein, ur: 100 mg/dL — AB
Specific Gravity, Urine: 1.022 (ref 1.005–1.030)
Urobilinogen, UA: 1 mg/dL (ref 0.0–1.0)

## 2015-02-15 SURGERY — CYSTOURETEROSCOPY, WITH RETROGRADE PYELOGRAM AND STENT INSERTION
Anesthesia: General

## 2015-02-15 MED ORDER — CEFAZOLIN SODIUM-DEXTROSE 2-3 GM-% IV SOLR
2.0000 g | INTRAVENOUS | Status: DC
  Filled 2015-02-15: qty 50

## 2015-02-15 MED ORDER — LACTATED RINGERS IV SOLN
INTRAVENOUS | Status: DC
  Filled 2015-02-15: qty 1000

## 2015-02-15 MED ORDER — FENTANYL CITRATE (PF) 100 MCG/2ML IJ SOLN
INTRAMUSCULAR | Status: AC
  Filled 2015-02-15: qty 4

## 2015-02-15 MED ORDER — MIDAZOLAM HCL 2 MG/2ML IJ SOLN
INTRAMUSCULAR | Status: AC
  Filled 2015-02-15: qty 2

## 2015-02-15 MED ORDER — CEFAZOLIN SODIUM 1-5 GM-% IV SOLN
1.0000 g | INTRAVENOUS | Status: DC
  Filled 2015-02-15: qty 50

## 2015-02-15 MED ORDER — CEFAZOLIN SODIUM-DEXTROSE 2-3 GM-% IV SOLR
INTRAVENOUS | Status: AC
  Filled 2015-02-15: qty 50

## 2015-02-15 SURGICAL SUPPLY — 55 items
ADAPTER CATH URET PLST 4-6FR (CATHETERS) IMPLANT
ADPR CATH URET STRL DISP 4-6FR (CATHETERS)
BAG DRAIN URO-CYSTO SKYTR STRL (DRAIN) IMPLANT
BAG DRN ANRFLXCHMBR STRAP LEK (BAG)
BAG DRN UROCATH (DRAIN)
BAG URINE DRAINAGE (UROLOGICAL SUPPLIES) ×1 IMPLANT
BAG URINE LEG 19OZ MD ST LTX (BAG) IMPLANT
BASKET LASER NITINOL 1.9FR (BASKET) IMPLANT
BASKET STNLS GEMINI 4WIRE 3FR (BASKET) IMPLANT
BASKET ZERO TIP NITINOL 2.4FR (BASKET) IMPLANT
BLADE SURG 15 STRL LF DISP TIS (BLADE) IMPLANT
BLADE SURG 15 STRL SS (BLADE)
BSKT STON RTRVL 120 1.9FR (BASKET)
BSKT STON RTRVL GEM 120X11 3FR (BASKET)
BSKT STON RTRVL ZERO TP 2.4FR (BASKET)
CANISTER SUCT LVC 12 LTR MEDI- (MISCELLANEOUS) IMPLANT
CATH FOLEY 3WAY 30CC 22F (CATHETERS) ×1 IMPLANT
CATH HEMA 3WAY 30CC 24FR COUDE (CATHETERS) IMPLANT
CATH HEMA 3WAY 30CC 24FR RND (CATHETERS) IMPLANT
CATH INTERMIT  6FR 70CM (CATHETERS) IMPLANT
CATH URET 5FR 28IN CONE TIP (BALLOONS)
CATH URET 5FR 28IN OPEN ENDED (CATHETERS) IMPLANT
CATH URET 5FR 70CM CONE TIP (BALLOONS) IMPLANT
CATH URET DUAL LUMEN 6-10FR 50 (CATHETERS) IMPLANT
CLOTH BEACON ORANGE TIMEOUT ST (SAFETY) IMPLANT
ELECT BUTTON BIOP 24F 90D PLAS (MISCELLANEOUS) IMPLANT
ELECT REM PT RETURN 9FT ADLT (ELECTROSURGICAL)
ELECTRODE REM PT RTRN 9FT ADLT (ELECTROSURGICAL) IMPLANT
EVACUATOR MICROVAS BLADDER (UROLOGICAL SUPPLIES) IMPLANT
FIBER LASER FLEXIVA 365 (UROLOGICAL SUPPLIES) IMPLANT
FIBER LASER TRAC TIP (UROLOGICAL SUPPLIES) IMPLANT
GLOVE BIO SURGEON STRL SZ7.5 (GLOVE) IMPLANT
GOWN STRL REUS W/ TWL LRG LVL3 (GOWN DISPOSABLE) IMPLANT
GOWN STRL REUS W/ TWL XL LVL3 (GOWN DISPOSABLE) IMPLANT
GOWN STRL REUS W/TWL LRG LVL3 (GOWN DISPOSABLE)
GOWN STRL REUS W/TWL XL LVL3 (GOWN DISPOSABLE)
GUIDEWIRE 0.038 PTFE COATED (WIRE) IMPLANT
GUIDEWIRE ANG ZIPWIRE 038X150 (WIRE) IMPLANT
GUIDEWIRE STR DUAL SENSOR (WIRE) IMPLANT
HOLDER FOLEY CATH W/STRAP (MISCELLANEOUS) IMPLANT
IV NS IRRIG 3000ML ARTHROMATIC (IV SOLUTION) IMPLANT
KIT BALLIN UROMAX 15FX10 (LABEL) IMPLANT
KIT BALLN UROMAX 15FX4 (MISCELLANEOUS) IMPLANT
KIT BALLN UROMAX 26 75X4 (MISCELLANEOUS)
KIT SUPRAPUBIC CATH (MISCELLANEOUS) IMPLANT
MANIFOLD NEPTUNE II (INSTRUMENTS) IMPLANT
PACK CYSTO (CUSTOM PROCEDURE TRAY) IMPLANT
PLUG CATH AND CAP STER (CATHETERS) IMPLANT
SET ASPIRATION TUBING (TUBING) IMPLANT
SET HIGH PRES BAL DIL (LABEL)
SHEATH ACCESS URETERAL 38CM (SHEATH) IMPLANT
SURGILUBE 2OZ TUBE FLIPTOP (MISCELLANEOUS) IMPLANT
SUT ETHILON 3 0 PS 1 (SUTURE) IMPLANT
SYR 30ML LL (SYRINGE) IMPLANT
SYRINGE IRR TOOMEY STRL 70CC (SYRINGE) IMPLANT

## 2015-02-15 NOTE — Anesthesia Preprocedure Evaluation (Addendum)
Anesthesia Evaluation  Patient identified by MRN, date of birth, ID band Patient awake    Reviewed: Allergy & Precautions, NPO status , Patient's Chart, lab work & pertinent test results  Airway Mallampati: II  TM Distance: >3 FB     Dental  (+) Teeth Intact, Dental Advisory Given, Caps,    Pulmonary sleep apnea and Continuous Positive Airway Pressure Ventilation ,  Pt denies sleep Apnea   Pulmonary exam normal        Cardiovascular Exercise Tolerance: Good (-) angina(-) Orthopnea, (-) PND and (-) DOE + Valvular Problems/Murmurs AS  Rhythm:Regular + Systolic murmurs    Neuro/Psych PSYCHIATRIC DISORDERS Depression negative neurological ROS     GI/Hepatic Neg liver ROS, hiatal hernia, GERD  Medicated,(+) Cirrhosis       , Hepatitis -NASH   Endo/Other  diabetes, Well Controlled, Type 2, Oral Hypoglycemic Agents  Renal/GU negative Renal ROSRenal Calculus     Musculoskeletal negative musculoskeletal ROS (+) Arthritis , Osteoarthritis,  Fibromyalgia -  Abdominal   Peds  Hematology negative hematology ROS (+)   Anesthesia Other Findings   Reproductive/Obstetrics negative OB ROS                         Anesthesia Physical Anesthesia Plan  ASA: IV  Anesthesia Plan: General   Post-op Pain Management:    Induction: Intravenous  Airway Management Planned: LMA  Additional Equipment:   Intra-op Plan:   Post-operative Plan: Extubation in OR  Informed Consent: I have reviewed the patients History and Physical, chart, labs and discussed the procedure including the risks, benefits and alternatives for the proposed anesthesia with the patient or authorized representative who has indicated his/her understanding and acceptance.   Dental advisory given  Plan Discussed with: CRNA  Anesthesia Plan Comments:        Anesthesia Quick Evaluation

## 2015-02-15 NOTE — H&P (View-Only) (Signed)
Reason For Visit    History of Present Illness Follow-up-PCP Dr. Drema Dallas.    1-nephrolithiasis-patient was seen June 2016. She'd undergone CT scan for follow-up hepatic disease at [facility] revealed a 6 mm stone in the left renal pelvis with mild thickening of the wall of the renal pelvis and also a stone in the lower pole of the left kidney. She has mild left flank discomfort. She was told that she had a kidney stone in the past but nothing was done about it. She voids well. Her urine was dark 6 weeks ago and she was treated with Cipro for Klebsiella UTI. The urine cleared up after Cipro. She had gastric sleeve surgery and has lost 100 lbs since March 2015.     I had seen her for shockwave lithotripsy August 2016. Unfortunately her platelets were only 72,000. She has NASH. She is having some gross hematuria and left upper quadrant pain and on KUB it appeared the stone has progressed in the left proximal ureter.  Her coags were fairly normal. I did send a urine culture which was not sent. It is very frustrating at times. Also, I set her up for ureteroscopy but she did not schedule it.    Today, she is well. She's had some gross hematuria. She's had some left flank pain. She has not seen a stone pass.    UA to numerous count red cells and some bacteria. I sent it for culture.    KUB reveals stable left proximal ureteral opacity. Difficult KUB to read due to bowel gas.     Past Medical History Problems  1. History of cardiac murmur (Z86.79) 2. History of diabetes mellitus (Z86.39) 3. History of hepatic disease (Z87.19)  Surgical History Problems  1. History of Bladder Surgery 2. History of Gastric Surgery For Morbid Obesity Laparoscopic Longitudinal Gastrectomy 3. History of Tonsillectomy  Current Meds 1. Biotin CAPS;  Therapy: (Recorded:02Jun2016) to Recorded 2. Colace CAPS;  Therapy: (Recorded:02Jun2016) to Recorded 3. Iron TABS;  Therapy: (Recorded:02Jun2016) to  Recorded 4. MetFORMIN HCl - 500 MG Oral Tablet;  Therapy: (Recorded:02Jun2016) to Recorded 5. Protonix 20 MG Oral Tablet Delayed Release;  Therapy: (Recorded:02Jun2016) to Recorded 6. Vitamin B-12 TABS;  Therapy: (Recorded:02Jun2016) to Recorded 7. Wellbutrin SR 150 MG TBCR;  Therapy: (Recorded:02Jun2016) to Recorded 8. Zetia 10 MG Oral Tablet;  Therapy: (Recorded:02Jun2016) to Recorded  Allergies Medication  1. Doxycycline Hyclate CAPS  Family History Problems  1. Family history of Alzheimer's disease (Z82.0) : Father 2. Family history of NASH (nonalcoholic steatohepatitis) : Mother  Social History Problems  1. Denied: History of Alcohol use 2. Caffeine use (F15.90) 3. Father deceased   22 Alzheimers 4. Married 5. Mother deceased   33 NASH 37. Never a smoker 7. Number of children   1 son / 1 daughter  Vitals Vital Signs [Data Includes: Last 1 Day]  Recorded: 13Sep2016 04:15PM  Blood Pressure: 120 / 80 Temperature: 98.6 F Heart Rate: 77  Results/Data Urine [Data Includes: Last 1 Day]   13Sep2016  COLOR BLOODY   APPEARANCE CLOUDY   SPECIFIC GRAVITY 1.025   pH 6.0   GLUCOSE NEGATIVE   BILIRUBIN NEGATIVE   KETONE TRACE   BLOOD 3+   PROTEIN 2+   NITRITE POSITIVE   LEUKOCYTE ESTERASE 2+   SQUAMOUS EPITHELIAL/HPF 0-5 HPF  WBC 20-40 WBC/HPF  RBC PACKED RBC/HPF  BACTERIA MANY HPF  CRYSTALS NONE SEEN HPF  CASTS NONE SEEN LPF  Yeast NONE SEEN HPF   Old records or  history reviewed: dr Janice Norrie notes.  The following images/tracing/specimen were independently visualized:  CT, kub x 2.    Procedure KUB-comparison to prior CT and KUB, findings: There is an apparent density in the left proximal ureter at the level of L3 where the stone was noted prior. The kidneys are obscured by bowel gas. The bowel gas pattern appeared normal. The bones appeared normal.     Assessment Assessed  1. Left ureteral stone (N20.1) 2. Microhematuria (R31.2)  Plan  Health  Maintenance  1. UA With REFLEX; [Do Not Release]; Status:Complete;   Done: [id number] 04:05PM Left ureteral stone  2. Follow-up Schedule Surgery Office  Follow-up  Status: Hold For - Appointment   Requested for: 13Sep2016 3. KUB; Status:Resulted - Requires Verification;   Done: [id number] 04:22PM  Discussion/Summary Left ureteral stone-we went over the nature risks benefits and alternatives to left ureteroscopy. After pictures the anatomy. We discussed risk of bleeding infection and ureteral injury among others. Discussed as well need for staged procedure or pre-stenting. All questions answered. She elects to proceed. Another advantage if cystoscopy is we will be able to evaluate the bladder. She did have a CT scan with delayed images. No worrisome findings.     Signatures Electronically signed by : Festus Aloe, M.D.; Jan 18 2015  5:00PM EST  Add: Culture pending

## 2015-02-15 NOTE — Interval H&P Note (Signed)
History and Physical Interval Note:  02/15/2015 10:18 AM  Whitney [name]  has presented today for surgery, with the diagnosis of LEFT RENAL STONE GROSS HEMATURA  The various methods of treatment have been discussed with the patient and family. After consideration of risks, benefits and other options for treatment, the patient has consented to  Procedure(s): CYSTOSCOPY WITH  RIGHT RETROGRADE PYELOGRAM, LEFT URETEROSCOPY AND HOLMIUM LASER STENT,BLADDER BIOPSY,FULGERATION (N/A) BLADDER BIOPSY, FULGERATION (N/A) HOLMIUM LASER APPLICATION (N/A) as a surgical intervention .  The patient's history has been reviewed, patient examined. She had had left flank pain and gross hematuria. She thinks she had a "UTI". No dysuria or fever. She looks well. UA shows +LE, +N, tntc wbc and rbc. Bacteria was not seen but could be obscured. She completed cephalexin. I told her I don't see any signs of UTI and she completed cephalexin, so it would be reasonable to proceed. Her UA results and symptoms could simply be from the stent. She does not want to proceed, so I sent a urine for Cx and will reschedule her once Cx results are known.   Osinachi Navarrette

## 2015-02-15 NOTE — Progress Notes (Signed)
The surgery was cancell due to patient having a urinary infection

## 2015-02-17 ENCOUNTER — Other Ambulatory Visit: Payer: Self-pay | Admitting: Urology

## 2015-02-18 LAB — URINE CULTURE: Culture: >=100000

## 2015-02-23 NOTE — Progress Notes (Addendum)
Unable to speak with patient-multiple phone messages without a call back.Instructed via personal phone # to arrive at 0930 in am-Npo after Mn-including medications.

## 2015-02-24 ENCOUNTER — Encounter (HOSPITAL_BASED_OUTPATIENT_CLINIC_OR_DEPARTMENT_OTHER)
Admission: RE | Disposition: A | Discharge status: Home / Self Care | Payer: Self-pay | Source: Ambulatory Visit | Attending: Urology

## 2015-02-24 ENCOUNTER — Encounter (HOSPITAL_BASED_OUTPATIENT_CLINIC_OR_DEPARTMENT_OTHER): Payer: Self-pay

## 2015-02-24 ENCOUNTER — Ambulatory Visit (HOSPITAL_BASED_OUTPATIENT_CLINIC_OR_DEPARTMENT_OTHER): Payer: BC Managed Care – PPO | Admitting: Anesthesiology

## 2015-02-24 ENCOUNTER — Ambulatory Visit (HOSPITAL_BASED_OUTPATIENT_CLINIC_OR_DEPARTMENT_OTHER)
Admission: RE | Admit: 2015-02-24 | Discharge: 2015-02-24 | Disposition: A | Discharge status: Home / Self Care | Payer: BC Managed Care – PPO | Source: Ambulatory Visit | Attending: Urology | Admitting: Urology

## 2015-02-24 DIAGNOSIS — N3081 Other cystitis with hematuria: Secondary | ICD-10-CM | POA: Diagnosis not present

## 2015-02-24 DIAGNOSIS — F329 Major depressive disorder, single episode, unspecified: Secondary | ICD-10-CM | POA: Diagnosis not present

## 2015-02-24 DIAGNOSIS — Z79899 Other long term (current) drug therapy: Secondary | ICD-10-CM | POA: Diagnosis not present

## 2015-02-24 DIAGNOSIS — M797 Fibromyalgia: Secondary | ICD-10-CM | POA: Diagnosis not present

## 2015-02-24 DIAGNOSIS — K7460 Unspecified cirrhosis of liver: Secondary | ICD-10-CM | POA: Insufficient documentation

## 2015-02-24 DIAGNOSIS — K7581 Nonalcoholic steatohepatitis (NASH): Secondary | ICD-10-CM | POA: Insufficient documentation

## 2015-02-24 DIAGNOSIS — G4733 Obstructive sleep apnea (adult) (pediatric): Secondary | ICD-10-CM | POA: Insufficient documentation

## 2015-02-24 DIAGNOSIS — M3500 Sicca syndrome, unspecified: Secondary | ICD-10-CM | POA: Insufficient documentation

## 2015-02-24 DIAGNOSIS — E785 Hyperlipidemia, unspecified: Secondary | ICD-10-CM | POA: Diagnosis not present

## 2015-02-24 DIAGNOSIS — I350 Nonrheumatic aortic (valve) stenosis: Secondary | ICD-10-CM | POA: Diagnosis not present

## 2015-02-24 DIAGNOSIS — E119 Type 2 diabetes mellitus without complications: Secondary | ICD-10-CM | POA: Insufficient documentation

## 2015-02-24 DIAGNOSIS — Z7984 Long term (current) use of oral hypoglycemic drugs: Secondary | ICD-10-CM | POA: Insufficient documentation

## 2015-02-24 DIAGNOSIS — N200 Calculus of kidney: Secondary | ICD-10-CM | POA: Insufficient documentation

## 2015-02-24 DIAGNOSIS — K219 Gastro-esophageal reflux disease without esophagitis: Secondary | ICD-10-CM | POA: Diagnosis not present

## 2015-02-24 DIAGNOSIS — R310 Gross hematuria: Secondary | ICD-10-CM | POA: Diagnosis present

## 2015-02-24 DIAGNOSIS — M1990 Unspecified osteoarthritis, unspecified site: Secondary | ICD-10-CM | POA: Insufficient documentation

## 2015-02-24 HISTORY — PX: HOLMIUM LASER APPLICATION: SHX5852

## 2015-02-24 HISTORY — PX: CYSTOSCOPY WITH RETROGRADE PYELOGRAM, URETEROSCOPY AND STENT PLACEMENT: SHX5789

## 2015-02-24 LAB — POCT I-STAT 4, (NA,K, GLUC, HGB,HCT)
GLUCOSE: 86 mg/dL (ref 65–99)
HCT: 35 % — ABNORMAL LOW (ref 36.0–46.0)
Hemoglobin: 11.9 g/dL — ABNORMAL LOW (ref 12.0–15.0)
POTASSIUM: 3.8 mmol/L (ref 3.5–5.1)
Sodium: 140 mmol/L (ref 135–145)

## 2015-02-24 LAB — GLUCOSE, CAPILLARY
GLUCOSE-CAPILLARY: 55 mg/dL — AB (ref 65–99)
Glucose-Capillary: 123 mg/dL — ABNORMAL HIGH (ref 65–99)

## 2015-02-24 SURGERY — CYSTOURETEROSCOPY, WITH RETROGRADE PYELOGRAM AND STENT INSERTION
Anesthesia: General | Site: Renal | Laterality: Left

## 2015-02-24 MED ORDER — CEFAZOLIN SODIUM-DEXTROSE 2-3 GM-% IV SOLR
INTRAVENOUS | Status: AC
  Filled 2015-02-24: qty 50

## 2015-02-24 MED ORDER — FENTANYL CITRATE (PF) 100 MCG/2ML IJ SOLN
INTRAMUSCULAR | Status: DC | PRN
  Administered 2015-02-24: 50 ug via INTRAVENOUS

## 2015-02-24 MED ORDER — STERILE WATER FOR IRRIGATION IR SOLN
Status: DC | PRN
  Administered 2015-02-24: 3000 mL

## 2015-02-24 MED ORDER — LACTATED RINGERS IV SOLN
INTRAVENOUS | Status: DC
  Administered 2015-02-24: 11:00:00 via INTRAVENOUS
  Filled 2015-02-24: qty 1000

## 2015-02-24 MED ORDER — LIDOCAINE HCL (CARDIAC) 20 MG/ML IV SOLN
INTRAVENOUS | Status: DC | PRN
  Administered 2015-02-24: 60 mg via INTRAVENOUS

## 2015-02-24 MED ORDER — FENTANYL CITRATE (PF) 100 MCG/2ML IJ SOLN
INTRAMUSCULAR | Status: AC
  Filled 2015-02-24: qty 6

## 2015-02-24 MED ORDER — OXYCODONE HCL 5 MG PO TABS
5.0000 mg | ORAL_TABLET | ORAL | Status: DC | PRN
  Administered 2015-02-24: 5 mg via ORAL
  Filled 2015-02-24: qty 1

## 2015-02-24 MED ORDER — PHENYLEPHRINE HCL 10 MG/ML IJ SOLN
INTRAMUSCULAR | Status: DC | PRN
  Administered 2015-02-24 (×3): 40 ug via INTRAVENOUS
  Administered 2015-02-24: 80 ug via INTRAVENOUS
  Administered 2015-02-24 (×8): 40 ug via INTRAVENOUS

## 2015-02-24 MED ORDER — CEFAZOLIN SODIUM-DEXTROSE 2-3 GM-% IV SOLR
2.0000 g | INTRAVENOUS | Status: AC
  Administered 2015-02-24: 2 g via INTRAVENOUS
  Filled 2015-02-24: qty 50

## 2015-02-24 MED ORDER — PROPOFOL 10 MG/ML IV BOLUS
INTRAVENOUS | Status: DC | PRN
  Administered 2015-02-24: 110 mg via INTRAVENOUS

## 2015-02-24 MED ORDER — PROMETHAZINE HCL 25 MG/ML IJ SOLN
6.2500 mg | INTRAMUSCULAR | Status: DC | PRN
  Filled 2015-02-24: qty 1

## 2015-02-24 MED ORDER — CEFAZOLIN SODIUM 1-5 GM-% IV SOLN
1.0000 g | INTRAVENOUS | Status: DC
  Filled 2015-02-24: qty 50

## 2015-02-24 MED ORDER — ONDANSETRON HCL 4 MG/2ML IJ SOLN
INTRAMUSCULAR | Status: DC | PRN
  Administered 2015-02-24: 4 mg via INTRAVENOUS

## 2015-02-24 MED ORDER — MIDAZOLAM HCL 2 MG/2ML IJ SOLN
INTRAMUSCULAR | Status: AC
  Filled 2015-02-24: qty 2

## 2015-02-24 MED ORDER — HYDROMORPHONE HCL 1 MG/ML IJ SOLN
0.2500 mg | INTRAMUSCULAR | Status: DC | PRN
  Administered 2015-02-24 (×2): 0.25 mg via INTRAVENOUS
  Filled 2015-02-24: qty 1

## 2015-02-24 MED ORDER — BELLADONNA ALKALOIDS-OPIUM 16.2-60 MG RE SUPP
RECTAL | Status: AC
  Filled 2015-02-24: qty 1

## 2015-02-24 MED ORDER — MEPERIDINE HCL 25 MG/ML IJ SOLN
6.2500 mg | INTRAMUSCULAR | Status: DC | PRN
  Filled 2015-02-24: qty 1

## 2015-02-24 MED ORDER — OXYCODONE HCL 5 MG PO TABS
ORAL_TABLET | ORAL | Status: AC
  Filled 2015-02-24: qty 1

## 2015-02-24 MED ORDER — HYDROMORPHONE HCL 1 MG/ML IJ SOLN
INTRAMUSCULAR | Status: AC
  Filled 2015-02-24: qty 1

## 2015-02-24 MED ORDER — MIDAZOLAM HCL 5 MG/5ML IJ SOLN
INTRAMUSCULAR | Status: DC | PRN
  Administered 2015-02-24: 2 mg via INTRAVENOUS

## 2015-02-24 MED ORDER — SODIUM CHLORIDE 0.9 % IR SOLN
Status: DC | PRN
  Administered 2015-02-24: 3000 mL

## 2015-02-24 SURGICAL SUPPLY — 46 items
ADAPTER CATH URET PLST 4-6FR (CATHETERS) IMPLANT
ADPR CATH URET STRL DISP 4-6FR (CATHETERS)
BAG DRAIN URO-CYSTO SKYTR STRL (DRAIN) ×3 IMPLANT
BAG DRN UROCATH (DRAIN) ×2
BASKET LASER NITINOL 1.9FR (BASKET) ×3 IMPLANT
BASKET STNLS GEMINI 4WIRE 3FR (BASKET) IMPLANT
BASKET ZERO TIP NITINOL 2.4FR (BASKET) IMPLANT
BSKT STON RTRVL 120 1.9FR (BASKET) ×2
BSKT STON RTRVL GEM 120X11 3FR (BASKET)
BSKT STON RTRVL ZERO TP 2.4FR (BASKET)
CANISTER SUCT LVC 12 LTR MEDI- (MISCELLANEOUS) IMPLANT
CATH INTERMIT  6FR 70CM (CATHETERS) ×3 IMPLANT
CATH URET 5FR 28IN CONE TIP (BALLOONS)
CATH URET 5FR 28IN OPEN ENDED (CATHETERS) IMPLANT
CATH URET 5FR 70CM CONE TIP (BALLOONS) IMPLANT
CATH URET DUAL LUMEN 6-10FR 50 (CATHETERS) IMPLANT
CLOTH BEACON ORANGE TIMEOUT ST (SAFETY) ×3 IMPLANT
ELECT REM PT RETURN 9FT ADLT (ELECTROSURGICAL)
ELECTRODE REM PT RTRN 9FT ADLT (ELECTROSURGICAL) IMPLANT
FIBER LASER FLEXIVA 365 (UROLOGICAL SUPPLIES) IMPLANT
FIBER LASER TRAC TIP (UROLOGICAL SUPPLIES) ×3 IMPLANT
GLOVE BIO SURGEON STRL SZ 6.5 (GLOVE) ×3 IMPLANT
GLOVE BIO SURGEON STRL SZ7.5 (GLOVE) ×3 IMPLANT
GLOVE BIOGEL PI IND STRL 6.5 (GLOVE) ×4 IMPLANT
GLOVE BIOGEL PI INDICATOR 6.5 (GLOVE) ×2
GOWN STRL REUS W/ TWL LRG LVL3 (GOWN DISPOSABLE) ×2 IMPLANT
GOWN STRL REUS W/ TWL XL LVL3 (GOWN DISPOSABLE) ×2 IMPLANT
GOWN STRL REUS W/TWL LRG LVL3 (GOWN DISPOSABLE) ×3
GOWN STRL REUS W/TWL XL LVL3 (GOWN DISPOSABLE) ×3
GUIDEWIRE 0.038 PTFE COATED (WIRE) ×3 IMPLANT
GUIDEWIRE ANG ZIPWIRE 038X150 (WIRE) IMPLANT
GUIDEWIRE STR DUAL SENSOR (WIRE) ×6 IMPLANT
IV NS IRRIG 3000ML ARTHROMATIC (IV SOLUTION) ×3 IMPLANT
KIT BALLIN UROMAX 15FX10 (LABEL) IMPLANT
KIT BALLN UROMAX 15FX4 (MISCELLANEOUS) IMPLANT
KIT BALLN UROMAX 26 75X4 (MISCELLANEOUS)
KIT ROOM TURNOVER WOR (KITS) ×3 IMPLANT
MANIFOLD NEPTUNE II (INSTRUMENTS) ×3 IMPLANT
NS IRRIG 500ML POUR BTL (IV SOLUTION) ×3 IMPLANT
PACK CYSTO (CUSTOM PROCEDURE TRAY) ×3 IMPLANT
SET HIGH PRES BAL DIL (LABEL)
SHEATH ACCESS URETERAL 38CM (SHEATH) ×2 IMPLANT
STENT URET 6FRX26 CONTOUR (STENTS) ×2 IMPLANT
SURGILUBE 2OZ TUBE FLIPTOP (MISCELLANEOUS) ×3 IMPLANT
SYRINGE IRR TOOMEY STRL 70CC (SYRINGE) IMPLANT
WATER STERILE IRR 3000ML UROMA (IV SOLUTION) ×2 IMPLANT

## 2015-02-24 NOTE — Discharge Instructions (Signed)
Ureteral Stent Implantation, Care After Refer to this sheet in the next few weeks. These instructions provide you with information on caring for yourself after your procedure. Your health care provider may also give you more specific instructions. Your treatment has been planned according to current medical practices, but problems sometimes occur. Call your health care provider if you have any problems or questions after your procedure. WHAT TO EXPECT AFTER THE PROCEDURE You should be back to normal activity within 48 hours after the procedure. Nausea and vomiting may occur and are commonly the result of anesthesia. It is common to experience sharp pain in the back or lower abdomen and penis with voiding. This is caused by movement of the ends of the stent with the act of urinating.It usually goes away within minutes after you have stopped urinating. HOME CARE INSTRUCTIONS Make sure to drink plenty of fluids. You may have small amounts of bleeding, causing your urine to be red. This is normal. Certain movements may trigger pain or a feeling that you need to urinate. You may be given medicines to prevent infection or bladder spasms. Be sure to take all medicines as directed. Only take over-the-counter or prescription medicines for pain, discomfort, or fever as directed by your health care provider. Do not take aspirin, as this can make bleeding worse.  Removal of the stent: Remove the stent on Monday morning 02/28/2015 by pulling the string with slow steady pressure until the entire stent is removed. If you do not want to remove the stent please call the office and you could come in for a nurse to pull it. Please have it removed before your antibiotics are completed.  Be sure to keep all follow-up appointments so your health care provider can check that you are healing properly.  SEEK MEDICAL CARE IF:  You experience increasing pain.  Your pain medicine is not working.  SEEK IMMEDIATE MEDICAL CARE  IF:  Your urine is dark red or has blood clots.  You are leaking urine (incontinent).  You have a fever, chills, feeling sick to your stomach (nausea), or vomiting.  Your pain is not relieved by pain medicine.  The end of the stent comes out of the urethra.  You are unable to urinate.   This information is not intended to replace advice given to you by your health care provider. Make sure you discuss any questions you have with your health care provider.   Document Released: 12/24/2012 Document Revised: 04/28/2013 Document Reviewed: 11/05/2014 Elsevier Interactive Patient Education 2016 [facility] Care Instructions  Activity: Get plenty of rest for the remainder of the day. A responsible adult should stay with you for 24 hours following the procedure.  For the next 24 hours, DO NOT: -Drive a car -Paediatric nurse -Drink alcoholic beverages -Take any medication unless instructed by your physician -Make any legal decisions or sign important papers.  Meals: Start with liquid foods such as gelatin or soup. Progress to regular foods as tolerated. Avoid greasy, spicy, heavy foods. If nausea and/or vomiting occur, drink only clear liquids until the nausea and/or vomiting subsides. Call your physician if vomiting continues.  Special Instructions/Symptoms: Your throat may feel dry or sore from the anesthesia or the breathing tube placed in your throat during surgery. If this causes discomfort, gargle with warm salt water. The discomfort should disappear within 24 hours.  If you had a scopolamine patch placed behind your ear for the management of post- operative  nausea and/or vomiting:  1. The medication in the patch is effective for 72 hours, after which it should be removed.  Wrap patch in a tissue and discard in the trash. Wash hands thoroughly with soap and water. 2. You may remove the patch earlier than 72 hours if you experience unpleasant side  effects which may include dry mouth, dizziness or visual disturbances. 3. Avoid touching the patch. Wash your hands with soap and water after contact with the patch.

## 2015-02-24 NOTE — H&P (Signed)
H&P  Chief Complaint: gross hematuria, left renal stone   History of Present Illness: Patient presents today for left ureteroscopy, holmium laser lithotripsy and stent placement with tether. She was originally scheduled for shockwave lithotripsy but had low platelets and the prolonged PTT. I ordered a urine culture in the hospital prior to her going home which was not done. She followed up in the office when I sent a urine culture but [facility]teroscopy at [facility] scheduled and the culture came back positive. I discussed with her 20% risk of needing a pre-stent/staged procedure and that a stent could prevent obstruction from the stone and potential pyelonephritis. She elected to proceed with cysto, pre-stenting. I started her on antibiotics but when she returned for ureteroscopy she began to have symptoms of urinary tract infection. A culture was sent and grew Klebsiella again. She was started on cephalexin and have kept her on it. Repeat urine culture was negative.   She's had gross hematuria even before the stent was placed and her CT at [facility] had incomplete delayed imaging. She also had some cystitis cystica changes in her bladder when I placed the stent.  Past Medical History  Diagnosis Date  . Depression   . Sjogren's syndrome ([place])   . Cirrhosis ([place]) last albumin 3.3 done at [facility] 06-16-2014 (under care everywhere tab in epic)    Secondary to Fatty liver --  followed by hepatology at [facility] (dr Gerald Dexter)   . Diabetes mellitus type II   . Hyperlipidemia   . Allergic rhinitis   . Fibromyalgia   . NASH (nonalcoholic steatohepatitis)   . Arthritis   . Moderate aortic stenosis     AVA area 1.1cm2---  cardiologist --  dr Concepcion Living, 2014 in epic  . History of rheumatic fever     1989  . Thrombocytopenia ([place])   . Leukocytopenia   . GERD (gastroesophageal reflux disease)   . Renal calculus, left   . History of exercise stress test     05-05-2013---  negative bruce ETT given exercise  workload,  no ischemia  . History of hiatal hernia   . Heart murmur     asymptomatic ---  1989 from rhuematic fever  . OSA (obstructive sleep apnea)     was using CPAP before gastric sleeve 2015--  no uses after wt loss   Past Surgical History  Procedure Laterality Date  . Hysteroscopy w/d&c N/A 12/11/2012    Procedure: DILATATION AND CURETTAGE /HYSTEROSCOPY;  Surgeon: Maeola Sarah. Landry Mellow, MD;  Location: [facility];  Service: Gynecology;  Laterality: N/A;  . Tonsillectomy  1975  . Exploratory laparoscopy w/  cone biopsy's left and right lobe of liver  11-04-2007  . Laparoscopic gastric sleeve resection  07-27-2013  . Pubovaginal sling  04-10-2001    [facility]  . Transthoracic echocardiogram  06-04-2012  dr Irish Lack    mild LVH,  grade I diastolic dysfunction/  ef 60-65%/  moderate LAE/  mild MV calcifation without stenosis,  mild MR/  moderate AV stenosis,  cannot r/o bicupsid, area 1.1cm2/  mild dilated aortic root/  trivial TR  . Cystoscopy with retrograde pyelogram, ureteroscopy and stent placement Left 01/21/2015    Procedure: CYSTOSCOPY WITH LEFT  RETROGRADE PYELOGRAM, LEFT URETEROSCOPY AND STENT PLACEMENT;  Surgeon: Festus Aloe, MD;  Location: [facility];  Service: Urology;  Laterality: Left;    Home Medications:  Prescriptions prior to admission  Medication Sig Dispense Refill Last Dose  . BIOTIN PO Take 1 tablet  by mouth daily.   02/23/2015 at Unknown time  . buPROPion (WELLBUTRIN SR) 150 MG 12 hr tablet Take 150 mg by mouth every morning.    02/24/2015 at 0600  . cephALEXin (KEFLEX) 250 MG capsule Take two caps po twice a day for three days and then one cap po qhs 30 capsule 1 02/23/2015 at Unknown time  . Cholecalciferol (VITAMIN D) 2000 UNITS CAPS Take 2,000 Units by mouth daily.   02/23/2015 at Unknown time  . docusate sodium (COLACE) 100 MG capsule Take 100 mg by mouth 2 (two) times daily as needed for mild constipation.   Past Week at Unknown time  . metFORMIN  (GLUCOPHAGE) 500 MG tablet Take 500 mg by mouth daily with breakfast.    Past Week at Unknown time  . MILK THISTLE PO Take 1 tablet by mouth daily.    Past Week at Unknown time  . oxyCODONE (ROXICODONE) 5 MG immediate release tablet Take 1 tablet (5 mg total) by mouth every 8 (eight) hours as needed for severe pain. 30 tablet 0 Past Month at Unknown time  . pantoprazole (PROTONIX) 20 MG tablet Take 20 mg by mouth daily as needed.    02/23/2015 at Unknown time  . PRESCRIPTION MEDICATION Place into the nose once a week. Vit B12 Nasal Spray once weekly--  Sunday's   Past Week at Unknown time  . tamsulosin (FLOMAX) 0.4 MG CAPS capsule Take 1 capsule (0.4 mg total) by mouth daily after supper. 30 capsule 0 Past Month at Unknown time   Allergies:  Allergies  Allergen Reactions  . Doxycycline Hives  . Naprosyn [Naproxen] Rash    Family History  Problem Relation Age of Onset  . Alzheimer's disease Father   . Hip fracture Father   . Allergies Daughter   . Asthma Father   . Asthma Paternal Grandmother   . Asthma Daughter   . Rheum arthritis Mother   . Heart disease Father   . Heart disease Mother    Social History:  reports that she has never smoked. She has never used smokeless tobacco. She reports that she drinks alcohol. She reports that she does not use illicit drugs.  ROS: A complete review of systems was performed.  All systems are negative except for pertinent findings as noted. ROS   Physical Exam:  Vital signs in last 24 hours: Temp:  [98.7 F (37.1 C)] 98.7 F (37.1 C) (10/20 0951) Pulse Rate:  [77] 77 (10/20 0951) Resp:  [16] 16 (10/20 0951) BP: (119)/(63) 119/63 mmHg (10/20 0951) SpO2:  [100 %] 100 % (10/20 0951) Weight:  [80.74 kg (178 lb)] 80.74 kg (178 lb) (10/20 0951) General:  Alert and oriented, No acute distress HEENT: Normocephalic, atraumatic Neck: No JVD or lymphadenopathy Cardiovascular: Regular rate and rhythm Lungs: Regular rate and effort Abdomen: Soft,  nontender, nondistended, no abdominal masses Back: No CVA tenderness Extremities: No edema Neurologic: Grossly intact  Laboratory Data:  Results for orders placed or performed during the hospital encounter of 02/24/15 (from the past 24 hour(s))  I-STAT 4, (NA,K, GLUC, HGB,HCT)     Status: Abnormal   Collection Time: 02/24/15 10:36 AM  Result Value Ref Range   Sodium 140 135 - 145 mmol/L   Potassium 3.8 3.5 - 5.1 mmol/L   Glucose, Bld 86 65 - 99 mg/dL   HCT 35.0 (L) 36.0 - 46.0 %   Hemoglobin 11.9 (L) 12.0 - 15.0 g/dL   Recent Results (from the past 240 hour(s))  Culture,  Urine     Status: None   Collection Time: 02/15/15  8:37 AM  Result Value Ref Range Status   Specimen Description URINE, RANDOM  Final   Special Requests NONE  Final   Culture   Final    >=100,000 COLONIES/mL KLEBSIELLA PNEUMONIAE Performed at [facility]    Report Status 02/18/2015 FINAL  Final   Organism ID, Bacteria KLEBSIELLA PNEUMONIAE  Final      Susceptibility   Klebsiella pneumoniae - MIC*    AMPICILLIN >=32 RESISTANT Resistant     CEFAZOLIN <=4 SENSITIVE Sensitive     CEFTRIAXONE <=1 SENSITIVE Sensitive     CIPROFLOXACIN <=0.25 SENSITIVE Sensitive     GENTAMICIN <=1 SENSITIVE Sensitive     IMIPENEM <=0.25 SENSITIVE Sensitive     NITROFURANTOIN 128 RESISTANT Resistant     TRIMETH/SULFA <=20 SENSITIVE Sensitive     AMPICILLIN/SULBACTAM 4 SENSITIVE Sensitive     PIP/TAZO <=4 SENSITIVE Sensitive     * >=100,000 COLONIES/mL KLEBSIELLA PNEUMONIAE   Creatinine: No results for input(s): CREATININE in the last 168 hours.  Impression/Assessment/plan: I discussed with the patient the nature, potential benefits, risks and alternatives to cystoscopy, right retrograde pyelogram, cystoscopy bladder biopsy with fulguration, left holmium laser lithotripsy and stent placement, including side effects of the proposed treatment, the likelihood of the patient achieving the goals of the procedure, and any  potential problems that might occur during the procedure or recuperation. discussed again she may need a staged procedure, as well as risk of bleeding and infection, ureteral or renal injury among others. She seems to be frustrated that she's had to be rescheduled a few times but I told her she has significant complex medical problems and we need to be certain is much as I can from a urologic point of view she is safer procedures. She also has aortic stenosis and hasn't followed up for continued evaluation. Dr. Lissa Hoard discussed with her cardiovascular risks with anesthesia. All questions answered. Patient elects to proceed.    Levone Otten 02/24/2015, 11:01 AM

## 2015-02-24 NOTE — Anesthesia Procedure Notes (Signed)
Procedure Name: LMA Insertion Date/Time: 02/24/2015 11:28 AM Performed by: Denna Haggard D Pre-anesthesia Checklist: Patient identified, Emergency Drugs available, Suction available and Patient being monitored Patient Re-evaluated:Patient Re-evaluated prior to inductionOxygen Delivery Method: Circle System Utilized Preoxygenation: Pre-oxygenation with 100% oxygen Intubation Type: IV induction Ventilation: Mask ventilation without difficulty LMA: LMA inserted LMA Size: 4.0 Number of attempts: 1 Airway Equipment and Method: Bite block Placement Confirmation: positive ETCO2 Tube secured with: Tape Dental Injury: Teeth and Oropharynx as per pre-operative assessment

## 2015-02-24 NOTE — Transfer of Care (Signed)
Immediate Anesthesia Transfer of Care Note  Patient: Whitney [name]  Procedure(s) Performed: Procedure(s) (LRB): CYSTOSCOPY WITH RIGHT RETROGRADE PYELOGRAM, BLADDER BIOPSY FULGERATION LEFT URETEROSCOPY AND STENT REPLACEMENT (Bilateral) HOLMIUM LASER LITHOTRIPSY (Left)  Patient Location: PACU  Anesthesia Type: General  Level of Consciousness: awake, oriented, sedated and patient cooperative  Airway & Oxygen Therapy: Patient Spontanous Breathing and Patient connected to face mask oxygen  Post-op Assessment: Report given to PACU RN and Post -op Vital signs reviewed and stable  Post vital signs: Reviewed and stable  Complications: No apparent anesthesia complications

## 2015-02-24 NOTE — Anesthesia Postprocedure Evaluation (Signed)
  Anesthesia Post-op Note  Patient: Whitney [name]  Procedure(s) Performed: Procedure(s) (LRB): CYSTOSCOPY WITH RIGHT RETROGRADE PYELOGRAM, BLADDER BIOPSY FULGERATION LEFT URETEROSCOPY AND STENT REPLACEMENT (Bilateral) HOLMIUM LASER LITHOTRIPSY (Left)  Patient Location: PACU  Anesthesia Type: General  Level of Consciousness: awake and alert   Airway and Oxygen Therapy: Patient Spontanous Breathing  Post-op Pain: mild  Post-op Assessment: Post-op Vital signs reviewed, Patient's Cardiovascular Status Stable, Respiratory Function Stable, Patent Airway and No signs of Nausea or vomiting  Last Vitals:  Filed Vitals:   02/24/15 1330  BP: 124/81  Pulse: 70  Temp:   Resp: 15    Post-op Vital Signs: stable   Complications: No apparent anesthesia complications

## 2015-02-24 NOTE — Op Note (Signed)
Preoperative diagnosis: Liver disease, aortic stenosis, left renal stone, gross hematuria, bladder cyst/neoplasm Postoperative diagnosis: Same  Procedure: Cystoscopy, right retrograde pyelogram, left ureteroscopy holmium laser lithotripsy, stone basket extraction, Left ureteral stent exchange with string, Cystoscopy bladder biopsy and fulguration  Surgeon: Junious Silk  Anesthesia: [place]  Type of anesthesia: Gen.  Indication for procedure: 59 year old was symptomatic left ureteral stone. Undergone pre-stenting and presented for definitive stone management incomplete evaluation of gross hematuria and bladder neoplasm.  Findings: On cystoscopy the urethra was normal, around the bladder neck was some cystitis cystica which appeared benign but was biopsied to ensure benign nature. The bladder had no stone and the mucosa was otherwise unremarkable apart from some inflammation from the stent coil over the left side.  Right retropyelogram-this outlined a single ureter single collecting system unit without filling defect, stricture or dilation. There was brisk drainage.  Left ureteroscopy-stone was noted to be at the left UPJ. Also located a stone in the midpole which was removed. I was extremely happy she was pre-stented as she had a lot more oozing than most patients and while no significant bleeding occurred it does not take much to obscure ureteroscopy and make it much more difficult. The ureteral dilation allowed I believe for much better irrigation, stone extraction and access sheath placement which limited mucosal abrasion.   Description of procedure: After consent was obtained patient brought to the operating room. After adequate anesthesia she was placed lithotomy position and prepped and draped in the usual sterile fashion. A timeout was performed to confirm the patient and procedure. The cystoscope was passed per urethra and the bladder inspected. The right ureteral orifice was cannulated  with a 6 [place] open-ended catheter and retrograde injection of contrast was performed. The left ureteral stent was then grasped and removed through the urethral meatus. A passed a sensor wire and coiled in the kidney. The medium size ureteral access sheath was advanced without difficulty. It was very easily and I used it to place 2 wires. I went outside safety wire and remove one wire. The digital access sheath was passed and the stone was grasped as it was trying to drop down into the lower pole and I brought into the upper pole. I kept in the basket and placed the laser fiber through the other port and manipulated the stone around with the basket and broken into small pieces at a setting of 0.2 and 50 which also served to dust a good bit of the stone. Pieces were then sequentially extracted from the upper and middle poles is a drop down there. 2 pieces were just too large to fit through the UPJ and therefore I was able to turn this fragments in the basket and laser off the edges to make them small enough to easily extract. All the pieces were collected. As best I could inspected the collecting system and found another stone in the midpole which was removed. I then inspected again the upper middle and lower poles visualization was obscured by some oozing from the mucosa but I felt there were no significant stone fragments remaining. Also there was no more fluoroscopic evidence of stone fragments as the initial stone and some of the fragments had been visible on fluoroscopy. The collecting system and renal pelvis were then final inspection noted to be normal, the UPJ appeared normal, the sheath and the ureteroscope were backed out together and the ureter noted to be very normal. There was virtually no trauma from the access sheath. The flexor  biopsy forceps were used to biopsy to the cystic lesions at the bladder neck and the area was lightly fulgurated. Hemostasis was excellent. The wire was backloaded on the  cystoscope and a 6 x 26 and ureter stent was advanced. The wire was removed with a good coil seen in the kidney and a good coil in the bladder. Hemostasis was excellent. The bladder was drained and the scope removed. A left a string on the stent. The patient was awakened and taken to recovery room in stable condition.  Complications: None  Blood loss: Minimal  Specimens: None  Drains: 6 x 26 cm left ureteral stent with string  Disposition: Patient stable to PACU

## 2015-02-25 ENCOUNTER — Encounter (HOSPITAL_BASED_OUTPATIENT_CLINIC_OR_DEPARTMENT_OTHER): Payer: Self-pay | Admitting: Urology

## 2015-06-22 DIAGNOSIS — R600 Localized edema: Secondary | ICD-10-CM | POA: Insufficient documentation

## 2015-06-22 NOTE — Progress Notes (Signed)
Patient ID: Whitney [name], female   DOB: 1955/11/02, 60 y.o.   MRN: [id number]     Cardiology Office Note   Date:  06/23/2015   ID:  Whitney [name], DOB [birth year], MRN [id number]  PCP:  Gennette Pac, MD    No chief complaint on file.    Wt Readings from Last 3 Encounters:  06/23/15 187 lb (84.823 kg)  02/24/15 178 lb (80.74 kg)  02/15/15 179 lb (81.194 kg)       History of Present Illness: Whitney [name] is a 60 y.o. female  who has had mid to moderate aortic stenosis. SHe has gained weight in the past year. She has not been exercising. No chest pain. Worsening exertional SHOB that resolves quickly. At random times, she has had a feeling in her left chest wall that is relieved with palpitation. Not related to walking. Only occurs at while still. Rare episodes. No lightheadedness or passing out. Worsening swelling several weeks ago. Better with compression stockings. SHe has some focal right leg pain with palpitations or when her dogs jump on it. She has NASH and is treated at [facility]. Albumin was low on recent check at 3.2. Other LFTshave been slightly elevated.  This has progressed to cirrhosis but she has not had sx of liver disease.   It has been several years since I saw her.  She had gastric sleeve surgery in 2015 and has lost a lot of weight.  She lost 100 lbs with the surgery.   She had an episode of chest pain in 2/15.  She had a negative w/u in the ER.   It has recurred randomly since that time.  Last episode was almost a year ago.  She walks without problems.    Her son also had fatty liver and subsequent weight loss surgery.     Past Medical History  Diagnosis Date  . Depression   . Sjogren's syndrome ([place])   . Cirrhosis ([place]) last albumin 3.3 done at [facility] 06-16-2014 (under care everywhere tab in epic)    Secondary to Fatty liver --  followed by hepatology at [facility] (dr Gerald Dexter)   . Diabetes mellitus type II   . Hyperlipidemia   . Allergic rhinitis   .  Fibromyalgia   . NASH (nonalcoholic steatohepatitis)   . Arthritis   . Moderate aortic stenosis     AVA area 1.1cm2---  cardiologist --  dr Concepcion Living, 2014 in epic  . History of rheumatic fever     1989  . Thrombocytopenia ([place])   . Leukocytopenia   . GERD (gastroesophageal reflux disease)   . Renal calculus, left   . History of exercise stress test     05-05-2013---  negative bruce ETT given exercise workload,  no ischemia  . History of hiatal hernia   . Heart murmur     asymptomatic ---  1989 from rhuematic fever  . OSA (obstructive sleep apnea)     was using CPAP before gastric sleeve 2015--  no uses after wt loss    Past Surgical History  Procedure Laterality Date  . Hysteroscopy w/d&c N/A 12/11/2012    Procedure: DILATATION AND CURETTAGE /HYSTEROSCOPY;  Surgeon: Maeola Sarah. Landry Mellow, MD;  Location: [facility];  Service: Gynecology;  Laterality: N/A;  . Tonsillectomy  1975  . Exploratory laparoscopy w/  cone biopsy's left and right lobe of liver  11-04-2007  . Laparoscopic gastric sleeve resection  07-27-2013  . Pubovaginal sling  04-10-2001  [facility]  . Transthoracic echocardiogram  06-04-2012  dr Irish Lack    mild LVH,  grade I diastolic dysfunction/  ef 60-65%/  moderate LAE/  mild MV calcifation without stenosis,  mild MR/  moderate AV stenosis,  cannot r/o bicupsid, area 1.1cm2/  mild dilated aortic root/  trivial TR  . Cystoscopy with retrograde pyelogram, ureteroscopy and stent placement Left 01/21/2015    Procedure: CYSTOSCOPY WITH LEFT  RETROGRADE PYELOGRAM, LEFT URETEROSCOPY AND STENT PLACEMENT;  Surgeon: Festus Aloe, MD;  Location: [facility];  Service: Urology;  Laterality: Left;  . Cystoscopy with retrograde pyelogram, ureteroscopy and stent placement Bilateral 02/24/2015    Procedure: CYSTOSCOPY WITH RIGHT RETROGRADE PYELOGRAM, BLADDER BIOPSY FULGERATION LEFT URETEROSCOPY AND STENT REPLACEMENT;  Surgeon: Festus Aloe, MD;  Location: [facility];  Service: Urology;  Laterality: Bilateral;  . Holmium laser application Left 88/50/2774    Procedure: HOLMIUM LASER LITHOTRIPSY;  Surgeon: Festus Aloe, MD;  Location: [facility];  Service: Urology;  Laterality: Left;     Current Outpatient Prescriptions  Medication Sig Dispense Refill  . BIOTIN PO Take 1 tablet by mouth daily.    Marland Kitchen buPROPion (WELLBUTRIN SR) 150 MG 12 hr tablet Take 150 mg by mouth every morning.     . Cholecalciferol (VITAMIN D) 2000 UNITS CAPS Take 2,000 Units by mouth daily.    Marland Kitchen docusate sodium (COLACE) 100 MG capsule Take 100 mg by mouth 2 (two) times daily as needed for mild constipation.    . ferrous sulfate 325 (65 FE) MG tablet Take 325 mg by mouth 3 (three) times a week.    . metFORMIN (GLUCOPHAGE) 500 MG tablet Take 500 mg by mouth daily with breakfast.     . MILK THISTLE PO Take 1 tablet by mouth daily.     . Multiple Vitamin (MULTI VITAMIN) TABS Take 3 tablets by mouth 2 (two) times daily. BARIATRIC MULTI VIT    . NASCOBAL 500 MCG/0.1ML SOLN Place 1 spray into both nostrils once a week.    . pantoprazole (PROTONIX) 20 MG tablet Take 20 mg by mouth daily as needed.     Marland Kitchen PRESCRIPTION MEDICATION Place into the nose once a week. Vit B12 Nasal Spray once weekly--  Sunday's     No current facility-administered medications for this visit.    Allergies:   Doxycycline and Naprosyn    Social History:  The patient  reports that she has never smoked. She has never used smokeless tobacco. She reports that she drinks alcohol. She reports that she does not use illicit drugs.   Family History:  The patient's family history includes Allergies in her daughter; Alzheimer's disease in her father; Asthma in her daughter, father, and paternal grandmother; Heart attack in her father; Heart disease in her father and mother; Hip fracture in her father; Hypertension in her father; Rheum arthritis in her mother. There is no history of Stroke.    ROS:   Please see the history of present illness.   Otherwise, review of systems are positive for rare chest pain as above; decreased tolerance for alcohol since the sleeve surgery.  Hair losssince the surgery.  All other systems are reviewed and negative.    PHYSICAL EXAM: VS:  BP 110/81 mmHg  Pulse 86  Ht 5' 4"  (1.626 m)  Wt 187 lb (84.823 kg)  BMI 32.08 kg/m2 , BMI Body mass index is 32.08 kg/(m^2). GEN: Well nourished, well developed, in no acute distress HEENT: normal Neck: no  JVD, carotid bruits, or masses Cardiac: RRR; no murmurs, rubs, or gallops,no edema  Respiratory:  clear to auscultation bilaterally, normal work of breathing GI: soft, nontender, nondistended, + BS MS: no deformity or atrophy Skin: warm and dry, no rash Neuro:  Strength and sensation are intact Psych: euthymic mood, full affect   EKG:   The ekg ordered today demonstrates NSR, PRWP   Recent Labs: 12/23/2014: Platelets 72* 02/24/2015: Hemoglobin 11.9*; Potassium 3.8; Sodium 140   Lipid Panel No results found for: CHOL, TRIG, HDL, CHOLHDL, VLDL, LDLCALC, LDLDIRECT   Other studies Reviewed: Additional studies/ records that were reviewed today with results demonstrating: Moderate aortic stenosis in 2014. No ischemia by treadmill test in 2014..   ASSESSMENT AND PLAN:  1. Aortic valve disease: Moderate aortic stenosis in the past.  We'll check echocardiogram to evaluate her aortic valve at this time. 2. Bilateral leg edema: this has resolved with weight loss.  She does have varicose veins. 3. SHOB: resolved with weight loss. 4. Obesity: I congratulated her on her weight loss and weight loss surgery. She has done very well. She continues to manage her diet to keep the weight off. 5. Atypical chest pain: Not related to exertion. She will continue to monitor symptoms. I don't think this is cardiac pain. 6. Diabetes: Much improved. Taking metformin only several times a week.   Current medicines are reviewed at  length with the patient today.  The patient concerns regarding her medicines were addressed.  The following changes have been made:  No change  Labs/ tests ordered today include:  No orders of the defined types were placed in this encounter.    Recommend 150 minutes/week of aerobic exercise Low fat, low carb, high fiber diet recommended  Disposition:   FU in 1 year   Teresita Madura., MD  06/23/2015 10:27 AM    [facility] [place], [place], [place]  [address] Phone: [phone]; Fax: [phone]

## 2015-06-23 ENCOUNTER — Ambulatory Visit (INDEPENDENT_AMBULATORY_CARE_PROVIDER_SITE_OTHER): Payer: BC Managed Care – PPO | Admitting: Interventional Cardiology

## 2015-06-23 ENCOUNTER — Encounter: Payer: Self-pay | Admitting: Interventional Cardiology

## 2015-06-23 VITALS — BP 110/81 | HR 86 | Ht 64.0 in | Wt 187.0 lb

## 2015-06-23 DIAGNOSIS — E119 Type 2 diabetes mellitus without complications: Secondary | ICD-10-CM

## 2015-06-23 DIAGNOSIS — K7469 Other cirrhosis of liver: Secondary | ICD-10-CM | POA: Diagnosis not present

## 2015-06-23 DIAGNOSIS — I359 Nonrheumatic aortic valve disorder, unspecified: Secondary | ICD-10-CM

## 2015-06-23 DIAGNOSIS — R600 Localized edema: Secondary | ICD-10-CM | POA: Diagnosis not present

## 2015-06-23 DIAGNOSIS — R0789 Other chest pain: Secondary | ICD-10-CM

## 2015-06-23 NOTE — Patient Instructions (Signed)
Medication Instructions:  Same-no changes  Labwork: None  Testing/Procedures: Your physician has requested that you have an echocardiogram. Echocardiography is a painless test that uses sound waves to create images of your heart. It provides your doctor with information about the size and shape of your heart and how well your heart's chambers and valves are working. This procedure takes approximately one hour. There are no restrictions for this procedure.  Follow-Up: Your physician wants you to follow-up in: 1 year. You will receive a reminder letter in the mail two months in advance. If you don't receive a letter, please call our office to schedule the follow-up appointment.    If you need a refill on your cardiac medications before your next appointment, please call your pharmacy.

## 2015-07-07 ENCOUNTER — Ambulatory Visit (HOSPITAL_COMMUNITY): Payer: BC Managed Care – PPO | Attending: Cardiology

## 2015-07-07 ENCOUNTER — Other Ambulatory Visit: Payer: Self-pay

## 2015-07-07 DIAGNOSIS — I352 Nonrheumatic aortic (valve) stenosis with insufficiency: Secondary | ICD-10-CM | POA: Insufficient documentation

## 2015-07-07 DIAGNOSIS — I359 Nonrheumatic aortic valve disorder, unspecified: Secondary | ICD-10-CM

## 2015-07-07 DIAGNOSIS — I350 Nonrheumatic aortic (valve) stenosis: Secondary | ICD-10-CM | POA: Diagnosis present

## 2015-07-07 DIAGNOSIS — I517 Cardiomegaly: Secondary | ICD-10-CM | POA: Insufficient documentation

## 2015-07-07 DIAGNOSIS — I059 Rheumatic mitral valve disease, unspecified: Secondary | ICD-10-CM | POA: Insufficient documentation

## 2015-07-07 DIAGNOSIS — G4733 Obstructive sleep apnea (adult) (pediatric): Secondary | ICD-10-CM | POA: Insufficient documentation

## 2016-04-26 DIAGNOSIS — E66811 Obesity, class 1: Secondary | ICD-10-CM | POA: Insufficient documentation

## 2016-04-27 ENCOUNTER — Other Ambulatory Visit: Payer: Self-pay | Admitting: Surgical Oncology

## 2016-04-27 DIAGNOSIS — Z01818 Encounter for other preprocedural examination: Secondary | ICD-10-CM

## 2016-05-02 ENCOUNTER — Ambulatory Visit
Admission: RE | Admit: 2016-05-02 | Discharge: 2016-05-02 | Disposition: A | Discharge status: Home / Self Care | Payer: BC Managed Care – PPO | Source: Ambulatory Visit | Attending: Surgical Oncology | Admitting: Surgical Oncology

## 2016-05-02 DIAGNOSIS — Z01818 Encounter for other preprocedural examination: Secondary | ICD-10-CM

## 2016-06-25 ENCOUNTER — Telehealth: Payer: Self-pay

## 2016-06-25 NOTE — Telephone Encounter (Signed)
**Note De-Identified Whitney [name] Obfuscation** The pt needs to have surgery for Left Inguinal Hernia with [facility]. They are requesting: 1. Cardiac Clearance  The pt is scheduled to see Dr Irish Lack on 08/08/16 which is a little later than recommended f/u of 2/18.  Please advise.

## 2016-06-26 ENCOUNTER — Telehealth: Payer: Self-pay | Admitting: Interventional Cardiology

## 2016-06-26 NOTE — Telephone Encounter (Signed)
Scheduled patient to see Dr. Irish Lack on 2/21

## 2016-06-26 NOTE — Telephone Encounter (Signed)
Spoke with patient who is available for appointment tomorrow with Dr. Irish Lack. She is scheduled to see him tomorrow, 2/21 at 3:30. She thanked me for the call.

## 2016-06-26 NOTE — Telephone Encounter (Signed)
Left message for patient to call back to discuss symptoms and to possibly schedule appointment with Dr. Irish Lack tomorrow

## 2016-06-26 NOTE — Telephone Encounter (Signed)
Patient is returning your call, in regards to a possible appointment with Dr. Irish Lack. Thanks.

## 2016-06-26 NOTE — Telephone Encounter (Signed)
Patient with severe aortic stenosis based on echo last year.  Any chest pain, DOE or syncope? Is she exercising.  I should see her once before surgery.  THere may be a spot tomorrow.

## 2016-06-27 ENCOUNTER — Ambulatory Visit (INDEPENDENT_AMBULATORY_CARE_PROVIDER_SITE_OTHER): Payer: BC Managed Care – PPO | Admitting: Interventional Cardiology

## 2016-06-27 ENCOUNTER — Encounter: Payer: Self-pay | Admitting: Interventional Cardiology

## 2016-06-27 VITALS — BP 110/76 | HR 76 | Ht 64.0 in | Wt 199.1 lb

## 2016-06-27 DIAGNOSIS — R0789 Other chest pain: Secondary | ICD-10-CM

## 2016-06-27 DIAGNOSIS — I359 Nonrheumatic aortic valve disorder, unspecified: Secondary | ICD-10-CM

## 2016-06-27 DIAGNOSIS — Z01810 Encounter for preprocedural cardiovascular examination: Secondary | ICD-10-CM | POA: Diagnosis not present

## 2016-06-27 NOTE — Progress Notes (Signed)
Patient ID: Whitney [name], female   DOB: [birth year], 61 y.o.   MRN: [id number]     Cardiology Office Note   Date:  06/27/2016   ID:  Whitney [name], DOB August 28, 1955, MRN [id number]  PCP:  Gennette Pac, MD    Chief Complaint  Patient presents with  . Cardiac Clearance     Wt Readings from Last 3 Encounters:  06/27/16 199 lb 1.9 oz (90.3 kg)  06/23/15 187 lb (84.8 kg)  02/24/15 178 lb (80.7 kg)       History of Present Illness: Whitney [name] is a 61 y.o. female  who has had aortic stenosis. SHe has gained weight since weight loss surgery. She has not been exercising. No chest pain.She has had a feeling in her left chest wall for years that is relieved with palpation. Not related to walking. Only occurs at while still. Rare episodes. No lightheadedness or passing out.  She has NASH and is treated at [facility]. Albumin was low on recent check at 3.2. Other LFTshave been slightly elevated.  This has progressed to cirrhosis but she has not had sx of liver disease.   She had gastric sleeve surgery in 2015 and has lost a lot of weight.  She lost 100 lbs with the surgery.   Her son also had fatty liver and subsequent weight loss surgery.   Swelling has not been an issue of late.  She needs to have hernia repair. She has had aortic stenosis which has progressed to severe range as of March 2017. She has been asymptomatic from a cardiac standpoint. She denies any exertional chest discomfort. She has not had any signs of heart failure including orthopnea, fluid retention. She denies any lightheadedness or syncope.  Her exercise has been limited of late by her hernia. Prior to this, she walked without any restrictions. Prior evaluations for cardiac ischemia were negative in the past.  Overall, she feels like she is stable from a heart standpoint. She is following up at [facility] for her liver issues.    Past Medical History:  Diagnosis Date  . Allergic rhinitis   . Arthritis   . Cirrhosis  ([place]) last albumin 3.3 done at [facility] 06-16-2014 (under care everywhere tab in epic)   Secondary to Fatty liver --  followed by hepatology at [facility] (dr Gerald Dexter)   . Depression   . Diabetes mellitus type II   . Fibromyalgia   . GERD (gastroesophageal reflux disease)   . Heart murmur    asymptomatic ---  1989 from rhuematic fever  . History of exercise stress test    05-05-2013---  negative bruce ETT given exercise workload,  no ischemia  . History of hiatal hernia   . History of rheumatic fever    1989  . Hyperlipidemia   . Leukocytopenia   . Moderate aortic stenosis    AVA area 1.1cm2---  cardiologist --  dr Concepcion Living, 2014 in epic  . NASH (nonalcoholic steatohepatitis)   . OSA (obstructive sleep apnea)    was using CPAP before gastric sleeve 2015--  no uses after wt loss  . Renal calculus, left   . Sjogren's syndrome ([place])   . Thrombocytopenia ([place])     Past Surgical History:  Procedure Laterality Date  . CYSTOSCOPY WITH RETROGRADE PYELOGRAM, URETEROSCOPY AND STENT PLACEMENT Left 01/21/2015   Procedure: CYSTOSCOPY WITH LEFT  RETROGRADE PYELOGRAM, LEFT URETEROSCOPY AND STENT PLACEMENT;  Surgeon: Festus Aloe, MD;  Location: [facility];  Service:  Urology;  Laterality: Left;  . CYSTOSCOPY WITH RETROGRADE PYELOGRAM, URETEROSCOPY AND STENT PLACEMENT Bilateral 02/24/2015   Procedure: CYSTOSCOPY WITH RIGHT RETROGRADE PYELOGRAM, BLADDER BIOPSY FULGERATION LEFT URETEROSCOPY AND STENT REPLACEMENT;  Surgeon: Festus Aloe, MD;  Location: [facility];  Service: Urology;  Laterality: Bilateral;  . EXPLORATORY LAPAROSCOPY W/  CONE BIOPSY'S LEFT AND RIGHT LOBE OF LIVER  11-04-2007  . HOLMIUM LASER APPLICATION Left 69/67/8938   Procedure: HOLMIUM LASER LITHOTRIPSY;  Surgeon: Festus Aloe, MD;  Location: [facility];  Service: Urology;  Laterality: Left;  . HYSTEROSCOPY W/D&C N/A 12/11/2012   Procedure: DILATATION AND CURETTAGE /HYSTEROSCOPY;   Surgeon: Maeola Sarah. Landry Mellow, MD;  Location: [facility];  Service: Gynecology;  Laterality: N/A;  . LAPAROSCOPIC GASTRIC SLEEVE RESECTION  07-27-2013  . PUBOVAGINAL SLING  04-10-2001   [place]  . TONSILLECTOMY  1975  . TRANSTHORACIC ECHOCARDIOGRAM  06-04-2012  dr Irish Lack   mild LVH,  grade I diastolic dysfunction/  ef 60-65%/  moderate LAE/  mild MV calcifation without stenosis,  mild MR/  moderate AV stenosis,  cannot r/o bicupsid, area 1.1cm2/  mild dilated aortic root/  trivial TR     Current Outpatient Prescriptions  Medication Sig Dispense Refill  . BIOTIN PO Take 1 tablet by mouth daily.    Marland Kitchen buPROPion (WELLBUTRIN SR) 150 MG 12 hr tablet Take 150 mg by mouth every morning.     . Cholecalciferol (VITAMIN D) 2000 UNITS CAPS Take 2,000 Units by mouth daily.    Marland Kitchen docusate sodium (COLACE) 100 MG capsule Take 100 mg by mouth 2 (two) times daily as needed for mild constipation.    . ferrous sulfate 325 (65 FE) MG tablet Take 325 mg by mouth 3 (three) times a week.    . metFORMIN (GLUCOPHAGE) 500 MG tablet Take 500 mg by mouth daily with breakfast.     . MILK THISTLE PO Take 1 tablet by mouth daily.     . Multiple Vitamin (MULTI VITAMIN) TABS Take 3 tablets by mouth 2 (two) times daily. BARIATRIC MULTI VIT    . NASCOBAL 500 MCG/0.1ML SOLN Place 1 spray into both nostrils once a week.    Marland Kitchen PRESCRIPTION MEDICATION Place into the nose once a week. Vit B12 Nasal Spray once weekly--  Sunday's     No current facility-administered medications for this visit.     Allergies:   Doxycycline and Naprosyn [naproxen]    Social History:  The patient  reports that she has never smoked. She has never used smokeless tobacco. She reports that she drinks alcohol. She reports that she does not use drugs.   Family History:  The patient's family history includes Allergies in her daughter; Alzheimer's disease in her father; Asthma in her daughter, father, and paternal grandmother; Heart attack in her father; Heart disease  in her father and mother; Hip fracture in her father; Hypertension in her father; Rheum arthritis in her mother.    ROS:  Please see the history of present illness.   Otherwise, review of systems are positive for rare chest pain as above; decreased tolerance for alcohol since the sleeve surgery.  Hair losssince the surgery.  All other systems are reviewed and negative.    PHYSICAL EXAM: VS:  BP 110/76   Pulse 76   Ht 5' 4"  (1.626 m)   Wt 199 lb 1.9 oz (90.3 kg)   BMI 34.18 kg/m  , BMI Body mass index is 34.18 kg/m. GEN: Well nourished, well developed, in no  acute distress  HEENT: normal  Neck: no JVD, carotid bruits, or masses Cardiac: RRR; 2/6 early systolic murmur, no rubs, or gallops,no edema  Respiratory:  clear to auscultation bilaterally, normal work of breathing GI: soft, nontender, nondistended, + BS MS: no deformity or atrophy  Skin: warm and dry, no rash Neuro:  Strength and sensation are intact Psych: euthymic mood, full affect   EKG:   The ekg ordered today demonstrates NSR, PRWP, no ST changes   Recent Labs: No results found for requested labs within last 8760 hours.   Lipid Panel No results found for: CHOL, TRIG, HDL, CHOLHDL, VLDL, LDLCALC, LDLDIRECT   Other studies Reviewed: Additional studies/ records that were reviewed today with results demonstrating: Moderate aortic stenosis in 2014. Severe AS in 2017. No ischemia by treadmill test in 2014..   ASSESSMENT AND PLAN:  1. Aortic valve disease: Severe aortic stenosis by most recent echo.  2. Bilateral leg edema: this has resolved with weight loss.  She does have varicose veins. 3. SHOB: improved with weight loss. 4. Obesity: With weight loss surgery, she has done very well. She continues to manage her diet. 5. Atypical chest pain: Not related to exertion. She will continue to monitor symptoms. I don't think this is cardiac pain. 6. Diabetes: Much improved.  7. Preoperative evaluation: Hernia surgery is  not high risk from a cardiac standpoint. She is at moderate risk given her other cardiac comorbidities including aortic stenosis.  Of note, she does have severe aortic stenosis. This should be taken into consideration by the anesthesiologist. She does not have cardiac symptoms. She does not appear clinically to have critical aortic stenosis. I think she would tolerate surgery. We discussed the risks and she understands. She does not currently have an indication for aortic valve replacement.   Current medicines are reviewed at length with the patient today.  The patient concerns regarding her medicines were addressed.  The following changes have been made:  No change  Labs/ tests ordered today include:  No orders of the defined types were placed in this encounter.   Recommend 150 minutes/week of aerobic exercise Low fat, low carb, high fiber diet recommended  Disposition:   FU in 1 year   Signed, Larae Grooms, MD  06/27/2016 4:05 PM    [facility] [place], [place], [place]  [address] Phone: [phone]; Fax: [phone]

## 2016-06-27 NOTE — Patient Instructions (Signed)
Medication Instructions:  None  Labwork: None  Testing/Procedures: None  Follow-Up: Your physician wants you to follow-up in: 1 year with Dr. Irish Lack.  You will receive a reminder letter in the mail two months in advance. If you don't receive a letter, please call our office to schedule the follow-up appointment.   Any Other Special Instructions Will Be Listed Below (If Applicable).     If you need a refill on your cardiac medications before your next appointment, please call your pharmacy.

## 2016-06-29 ENCOUNTER — Other Ambulatory Visit: Payer: Self-pay | Admitting: General Surgery

## 2016-06-29 ENCOUNTER — Other Ambulatory Visit: Payer: Self-pay

## 2016-06-29 DIAGNOSIS — I359 Nonrheumatic aortic valve disorder, unspecified: Secondary | ICD-10-CM

## 2016-08-08 ENCOUNTER — Ambulatory Visit: Payer: BC Managed Care – PPO | Admitting: Interventional Cardiology

## 2016-09-28 ENCOUNTER — Encounter (HOSPITAL_COMMUNITY): Payer: Self-pay

## 2016-09-28 ENCOUNTER — Other Ambulatory Visit: Payer: Self-pay | Admitting: General Surgery

## 2016-09-28 ENCOUNTER — Encounter (HOSPITAL_COMMUNITY)
Admission: RE | Admit: 2016-09-28 | Discharge: 2016-09-28 | Disposition: A | Discharge status: Home / Self Care | Payer: BC Managed Care – PPO | Source: Ambulatory Visit | Attending: General Surgery | Admitting: General Surgery

## 2016-09-28 DIAGNOSIS — I350 Nonrheumatic aortic (valve) stenosis: Secondary | ICD-10-CM | POA: Diagnosis not present

## 2016-09-28 DIAGNOSIS — K219 Gastro-esophageal reflux disease without esophagitis: Secondary | ICD-10-CM | POA: Insufficient documentation

## 2016-09-28 DIAGNOSIS — E785 Hyperlipidemia, unspecified: Secondary | ICD-10-CM | POA: Diagnosis not present

## 2016-09-28 DIAGNOSIS — I10 Essential (primary) hypertension: Secondary | ICD-10-CM | POA: Insufficient documentation

## 2016-09-28 DIAGNOSIS — Z01812 Encounter for preprocedural laboratory examination: Secondary | ICD-10-CM | POA: Diagnosis present

## 2016-09-28 HISTORY — DX: Anemia, unspecified: D64.9

## 2016-09-28 HISTORY — DX: Pneumonia, unspecified organism: J18.9

## 2016-09-28 HISTORY — DX: Personal history of urinary calculi: Z87.442

## 2016-09-28 HISTORY — DX: Unspecified asthma, uncomplicated: J45.909

## 2016-09-28 LAB — COMPREHENSIVE METABOLIC PANEL
ALBUMIN: 3.3 g/dL — AB (ref 3.5–5.0)
ALK PHOS: 60 U/L (ref 38–126)
ALT: 19 U/L (ref 14–54)
AST: 31 U/L (ref 15–41)
Anion gap: 7 (ref 5–15)
BILIRUBIN TOTAL: 1.4 mg/dL — AB (ref 0.3–1.2)
BUN: 12 mg/dL (ref 6–20)
CO2: 27 mmol/L (ref 22–32)
Calcium: 9.1 mg/dL (ref 8.9–10.3)
Chloride: 105 mmol/L (ref 101–111)
Creatinine, Ser: 0.66 mg/dL (ref 0.44–1.00)
GFR calc Af Amer: >60 mL/min (ref >60)
Glucose, Bld: 86 mg/dL (ref 65–99)
Potassium: 4.3 mmol/L (ref 3.5–5.1)
Sodium: 139 mmol/L (ref 135–145)
Total Protein: 6.5 g/dL (ref 6.5–8.1)

## 2016-09-28 LAB — CBC
HEMATOCRIT: 42.9 % (ref 36.0–46.0)
HEMOGLOBIN: 13.9 g/dL (ref 12.0–15.0)
MCH: 29.3 pg (ref 26.0–34.0)
MCHC: 32.4 g/dL (ref 30.0–36.0)
MCV: 90.3 fL (ref 78.0–100.0)
Platelets: 84 10*3/uL — ABNORMAL LOW (ref 150–400)
RBC: 4.75 MIL/uL (ref 3.87–5.11)
RDW: 14.3 % (ref 11.5–15.5)
WBC: 2.8 10*3/uL — AB (ref 4.0–10.5)

## 2016-09-28 LAB — APTT: APTT: 38 s — AB (ref 24–36)

## 2016-09-28 LAB — PROTIME-INR
INR: 1.15
Prothrombin Time: 14.8 seconds (ref 11.4–15.2)

## 2016-09-28 LAB — GLUCOSE, CAPILLARY: GLUCOSE-CAPILLARY: 78 mg/dL (ref 65–99)

## 2016-09-28 NOTE — Pre-Procedure Instructions (Addendum)
Whitney [name]  09/28/2016      [id number] [name] - [name]  [id number]: [phone]: [phone]   Your procedure is scheduled on 10/03/16  [facility]  Call this number if you have problems the morning of surgery:  [phone]   Remember:  Do not eat food or drink liquids after midnight.  Take these medicines the morning of surgery with A SIP OF WATER      Bupropion(wellbutrin)   Drink 8oz water at 930 am(measure please)  STOP all herbel meds, nsaids (aleve,naproxen,advil,ibuprofen) prior to surgery starting Today 09/28/16 including all vitamins/supplements,     How to Manage Your Diabetes Before and After Surgery  Why is it important to control my blood sugar before and after surgery? . Improving blood sugar levels before and after surgery helps healing and can limit problems. . A way of improving blood sugar control is eating a healthy diet by: o  Eating less sugar and carbohydrates o  Increasing activity/exercise o  Talking with your doctor about reaching your blood sugar goals . High blood sugars (greater than 180 mg/dL) can raise your risk of infections and slow your recovery, so you will need to focus on controlling your diabetes during the weeks before surgery. . Make sure that the doctor who takes care of your diabetes knows about your planned surgery including the date and location.  How do I manage my blood sugar before surgery? . Check your blood sugar at least 4 times a day, starting 2 days before surgery, to make sure that the level is not too high or low. o Check your blood sugar the morning of your surgery when you wake up and every 2 hours until you get to the Short Stay unit. . If your blood sugar is less than 70 mg/dL, you will need to treat for low blood sugar: o Do not take insulin. o Treat a low blood sugar (less than 70 mg/dL) with  cup  of clear juice (cranberry or apple), 4 glucose tablets, OR glucose gel. o Recheck blood sugar in 15 minutes after treatment (to make sure it is greater than 70 mg/dL). If your blood sugar is not greater than 70 mg/dL on recheck, call [phone] further instructions. . Report your blood sugar to the short stay nurse when you get to Short Stay.  . If you are admitted to the hospital after surgery: o Your blood sugar will be checked by the staff and you will probably be given insulin after surgery (instead of oral diabetes medicines) to make sure you have good blood sugar levels. o The goal for blood sugar control after surgery is 80-180 mg/dL.  WHAT DO I DO ABOUT MY DIABETES MEDICATION?  Do not take oral diabetes medicines (pills) the morning of surgery.(no metformin)    Do not wear jewelry, make-up or nail polish.  Do not wear lotions, powders, or perfumes, or deoderant.  Do not shave 48 hours prior to surgery.  Men may shave face and neck.  Do not bring valuables to the hospital.  [facility] belongings or [facility].  Contacts, dentures or bridgework may not be worn into surgery.  Leave your suitcase in the car.  After surgery it may be brought to your room.  For patients admitted to the hospital, discharge time will be determined by your treatment team.  Patients discharged the day of surgery will not be allowed to drive home.   Special instructions:   Special Instructions: [facility] - Preparing for Surgery  Before surgery, you can play an important role.  Because skin is not sterile, your skin needs to be as free of germs as possible.  You can reduce the number of germs on you skin by washing with CHG (chlorahexidine gluconate) soap before surgery.  CHG is an antiseptic cleaner which kills germs and bonds with the skin to continue killing germs even after washing.  Please DO NOT use if you have an allergy to CHG or antibacterial soaps.  If your skin  becomes reddened/irritated stop using the CHG and inform your nurse when you arrive at Short Stay.  Do not shave (including legs and underarms) for at least 48 hours prior to the first CHG shower.  You may shave your face.  Please follow these instructions carefully:   1.  Shower with CHG Soap the night before surgery and the morning of Surgery.  2.  If you choose to wash your hair, wash your hair first as usual with your normal shampoo.  3.  After you shampoo, rinse your hair and body thoroughly to remove the Shampoo.  4.  Use CHG as you would any other liquid soap.  You can apply chg directly  to the skin and wash gently with scrungie or a clean washcloth.  5.  Apply the CHG Soap to your body ONLY FROM THE NECK DOWN.  Do not use on open wounds or open sores.  Avoid contact with your eyes ears, mouth and genitals (private parts).  Wash genitals (private parts)       with your normal soap.  6.  Wash thoroughly, paying special attention to the area where your surgery will be performed.  7.  Thoroughly rinse your body with warm water from the neck down.  8.  DO NOT shower/wash with your normal soap after using and rinsing off the CHG Soap.  9.  Pat yourself dry with a clean towel.            10.  Wear clean pajamas.            11.  Place clean sheets on your bed the night of your first shower and do not sleep with pets.  Day of Surgery  Do not apply any lotions/deodorants the morning of surgery.  Please wear clean clothes to the hospital/surgery center.  Please read over the  fact sheets that you were given.

## 2016-09-29 LAB — HEMOGLOBIN A1C
HEMOGLOBIN A1C: 5.4 % (ref 4.8–5.6)
MEAN PLASMA GLUCOSE: 108 mg/dL

## 2016-10-02 ENCOUNTER — Encounter (HOSPITAL_COMMUNITY): Payer: Self-pay | Admitting: Emergency Medicine

## 2016-10-02 NOTE — Progress Notes (Signed)
Anesthesia chart review:  Patient is a 61 year old female scheduled for L inguinal hernia repair with mesh, Block insertion of mesh on 10/03/2016 with Rolm Bookbinder, M.D.  - PCP is Hulan Fess, MD  - GI is Pleas Patricia, MD and Cornelius Moras, NP at [facility], last office visit 06/29/15. Pt recently finished participation in clinical trial for emricasan, and has been seeing trial GI for last year.  (notes in care everywhere)   - Cardiologist is Larae Grooms, MD who cleared pt for surgery at last office visit 06/27/16.  He notes: "Hernia surgery is not high risk from a cardiac standpoint. She is at moderate risk given her other cardiac comorbidities including aortic stenosis.  Of note, she does have severe aortic stenosis. This should be taken into consideration by the anesthesiologist. She does not have cardiac symptoms. She does not appear clinically to have critical aortic stenosis. I think she would tolerate surgery. We discussed the risks and she understands. She does not currently have an indication for aortic valve replacement."  PMH includes: severe aortic stenosis, DM, hyperlipidemia, Karlene Lineman cirrhosis with portal HTN, anemia, OSA, asthma, thrombocytopenia, leukopenia, Sjogren's syndrome, GERD. Never smoker. BMI 35. S/p cystoscopy, ureteroscopy, stent placement 02/24/15 and 01/21/15.   Medications include: Zetia, metformin.   Preoperative labs reviewed.   - HbA1c 5.4, glucose 86 - PT normal, PTT 38 - WBC 2.8. This is consistent with prior results.  - Platelets 84. This is consistent with prior results.   Echo 07/07/15:  - Left ventricle: The cavity size was normal. There was mild focal basal hypertrophy of the septum. Systolic function was normal. The estimated ejection fraction was in the range of 60% to 65%. Wall motion was normal; there were no regional wall motion abnormalities. Doppler parameters are consistent with abnormal left ventricular relaxation (grade 1 diastolic dysfunction). -  Aortic valve: Cusp separation was reduced. There was severe stenosis. There was trivial regurgitation. Mean gradient (S): 43 mm Hg. Peak gradient (S): 72 mm Hg. Valve area (VTI): 0.95 cm^2. Valve area (Vmax): 0.97 cm^2. Valve area (Vmean): 1.03 cm^2. - Mitral valve: Calcified annulus. Mildly thickened leaflets . The findings are consistent with mild stenosis. Valve area by pressure half-time: 1.95 cm^2. Valve area by continuity equation (using LVOT flow): 2.66 cm^2. - Left atrium: The atrium was moderately dilated.  - I left voicemail for triage nurse in Dr. Cristal Generous office about abnormal lab results.  While awaiting a call back, central OR scheduling called to notify me. Dr. Donne Hazel is cancelling case  Willeen Cass, FNP-BC [facility]/Anesthesiology Phone: [phone] 10/02/2016 11:07 AM

## 2016-10-03 ENCOUNTER — Ambulatory Visit (HOSPITAL_COMMUNITY): Admission: RE | Admit: 2016-10-03 | Payer: BC Managed Care – PPO | Source: Ambulatory Visit | Admitting: General Surgery

## 2016-10-03 ENCOUNTER — Encounter (HOSPITAL_COMMUNITY): Admission: RE | Payer: Self-pay | Source: Ambulatory Visit

## 2016-10-03 SURGERY — REPAIR, HERNIA, INGUINAL, ADULT
Anesthesia: General | Laterality: Left

## 2016-10-19 ENCOUNTER — Encounter (HOSPITAL_COMMUNITY): Payer: Self-pay | Admitting: *Deleted

## 2016-10-19 NOTE — Progress Notes (Signed)
Pt denies SOB and chest pain but is under the care of Dr. Irish Lack. Pt made aware to stop taking vitamins, fish oil, Milk Thistle, and herbal medications. Do not take any NSAIDs ie: Ibuprofen, Advil, Naproxen, BC and Goody Powder. Pt made aware to not take Metformin DOS. Pt made aware to check BG every 2 hours prior to arrival to hospital on DOS. Pt made aware to treat a BG < 70 with 4 glucose tabs or glucose gel or 4 ounces of apple or cranberry juice, wait 15 minutes after intervention to recheck BG, if BG remains < 70, call Short Stay unit to speak with a nurse. Pt verbalized understanding of all pre-op instructions.

## 2016-10-22 ENCOUNTER — Encounter (HOSPITAL_COMMUNITY)
Admission: RE | Disposition: A | Discharge status: Home / Self Care | Payer: Self-pay | Source: Ambulatory Visit | Attending: General Surgery

## 2016-10-22 ENCOUNTER — Ambulatory Visit (HOSPITAL_COMMUNITY): Payer: BC Managed Care – PPO | Admitting: Anesthesiology

## 2016-10-22 ENCOUNTER — Encounter (HOSPITAL_COMMUNITY): Payer: Self-pay | Admitting: *Deleted

## 2016-10-22 ENCOUNTER — Observation Stay (HOSPITAL_COMMUNITY)
Admission: RE | Admit: 2016-10-22 | Discharge: 2016-10-23 | Disposition: A | Discharge status: Home / Self Care | Payer: BC Managed Care – PPO | Source: Ambulatory Visit | Attending: General Surgery | Admitting: General Surgery

## 2016-10-22 DIAGNOSIS — K7460 Unspecified cirrhosis of liver: Secondary | ICD-10-CM | POA: Insufficient documentation

## 2016-10-22 DIAGNOSIS — K4090 Unilateral inguinal hernia, without obstruction or gangrene, not specified as recurrent: Secondary | ICD-10-CM | POA: Diagnosis present

## 2016-10-22 DIAGNOSIS — M3500 Sicca syndrome, unspecified: Secondary | ICD-10-CM | POA: Insufficient documentation

## 2016-10-22 DIAGNOSIS — E119 Type 2 diabetes mellitus without complications: Secondary | ICD-10-CM | POA: Insufficient documentation

## 2016-10-22 DIAGNOSIS — Z7984 Long term (current) use of oral hypoglycemic drugs: Secondary | ICD-10-CM | POA: Insufficient documentation

## 2016-10-22 DIAGNOSIS — K219 Gastro-esophageal reflux disease without esophagitis: Secondary | ICD-10-CM | POA: Diagnosis not present

## 2016-10-22 DIAGNOSIS — J45909 Unspecified asthma, uncomplicated: Secondary | ICD-10-CM | POA: Diagnosis not present

## 2016-10-22 DIAGNOSIS — G4730 Sleep apnea, unspecified: Secondary | ICD-10-CM | POA: Insufficient documentation

## 2016-10-22 DIAGNOSIS — Z79899 Other long term (current) drug therapy: Secondary | ICD-10-CM | POA: Insufficient documentation

## 2016-10-22 DIAGNOSIS — D696 Thrombocytopenia, unspecified: Secondary | ICD-10-CM | POA: Insufficient documentation

## 2016-10-22 HISTORY — PX: INGUINAL HERNIA REPAIR: SHX194

## 2016-10-22 HISTORY — PX: INSERTION OF MESH: SHX5868

## 2016-10-22 LAB — CBC
HCT: 41.1 % (ref 36.0–46.0)
HEMOGLOBIN: 13.4 g/dL (ref 12.0–15.0)
MCH: 29.1 pg (ref 26.0–34.0)
MCHC: 32.6 g/dL (ref 30.0–36.0)
MCV: 89.3 fL (ref 78.0–100.0)
PLATELETS: 79 10*3/uL — AB (ref 150–400)
RBC: 4.6 MIL/uL (ref 3.87–5.11)
RDW: 14.4 % (ref 11.5–15.5)
WBC: 2.5 10*3/uL — AB (ref 4.0–10.5)

## 2016-10-22 LAB — GLUCOSE, CAPILLARY
GLUCOSE-CAPILLARY: 84 mg/dL (ref 65–99)
Glucose-Capillary: 72 mg/dL (ref 65–99)
Glucose-Capillary: 74 mg/dL (ref 65–99)
Glucose-Capillary: 145 mg/dL — ABNORMAL HIGH (ref 65–99)
Glucose-Capillary: 212 mg/dL — ABNORMAL HIGH (ref 65–99)

## 2016-10-22 LAB — COMPREHENSIVE METABOLIC PANEL
ALBUMIN: 3.3 g/dL — AB (ref 3.5–5.0)
ALK PHOS: 52 U/L (ref 38–126)
ALT: 21 U/L (ref 14–54)
ANION GAP: 8 (ref 5–15)
AST: 38 U/L (ref 15–41)
BUN: 12 mg/dL (ref 6–20)
CALCIUM: 8.7 mg/dL — AB (ref 8.9–10.3)
CHLORIDE: 106 mmol/L (ref 101–111)
CO2: 24 mmol/L (ref 22–32)
Creatinine, Ser: 0.66 mg/dL (ref 0.44–1.00)
GFR calc non Af Amer: >60 mL/min (ref >60)
GLUCOSE: 88 mg/dL (ref 65–99)
POTASSIUM: 3.9 mmol/L (ref 3.5–5.1)
SODIUM: 138 mmol/L (ref 135–145)
Total Bilirubin: 1.2 mg/dL (ref 0.3–1.2)
Total Protein: 6.5 g/dL (ref 6.5–8.1)

## 2016-10-22 SURGERY — REPAIR, HERNIA, INGUINAL, ADULT
Anesthesia: General | Site: Groin | Laterality: Left

## 2016-10-22 MED ORDER — EZETIMIBE 10 MG PO TABS
10.0000 mg | ORAL_TABLET | Freq: Every evening | ORAL | Status: DC
Start: 2016-10-22 — End: 2016-10-23
  Filled 2016-10-22: qty 1

## 2016-10-22 MED ORDER — MIDAZOLAM HCL 2 MG/2ML IJ SOLN
INTRAMUSCULAR | Status: AC
  Filled 2016-10-22: qty 2

## 2016-10-22 MED ORDER — LIDOCAINE HCL (CARDIAC) 20 MG/ML IV SOLN
INTRAVENOUS | Status: DC | PRN
  Administered 2016-10-22: 100 mg via INTRAVENOUS

## 2016-10-22 MED ORDER — DEXAMETHASONE SODIUM PHOSPHATE 10 MG/ML IJ SOLN
INTRAMUSCULAR | Status: DC | PRN
  Administered 2016-10-22: 10 mg via INTRAVENOUS

## 2016-10-22 MED ORDER — SIMETHICONE 80 MG PO CHEW: 40.0000 mg | CHEWABLE_TABLET | Freq: Four times a day (QID) | ORAL | Status: DC | PRN

## 2016-10-22 MED ORDER — SUGAMMADEX SODIUM 200 MG/2ML IV SOLN
INTRAVENOUS | Status: AC
  Filled 2016-10-22: qty 4

## 2016-10-22 MED ORDER — DOCUSATE SODIUM 100 MG PO CAPS
100.0000 mg | ORAL_CAPSULE | Freq: Every day | ORAL | Status: DC
  Administered 2016-10-22: 100 mg via ORAL
  Filled 2016-10-22: qty 1

## 2016-10-22 MED ORDER — LACTATED RINGERS IV SOLN
INTRAVENOUS | Status: DC | PRN
  Administered 2016-10-22: 12:00:00 via INTRAVENOUS

## 2016-10-22 MED ORDER — OXYCODONE HCL 5 MG PO TABS
ORAL_TABLET | ORAL | Status: AC
  Filled 2016-10-22: qty 2

## 2016-10-22 MED ORDER — FENTANYL CITRATE (PF) 100 MCG/2ML IJ SOLN
INTRAMUSCULAR | Status: AC
  Administered 2016-10-22: 50 ug via INTRAVENOUS
  Filled 2016-10-22: qty 2

## 2016-10-22 MED ORDER — 0.9 % SODIUM CHLORIDE (POUR BTL) OPTIME
TOPICAL | Status: DC | PRN
  Administered 2016-10-22: 1000 mL

## 2016-10-22 MED ORDER — BUPROPION HCL ER (SR) 150 MG PO TB12: 150.0000 mg | ORAL_TABLET | Freq: Every day | ORAL | Status: DC

## 2016-10-22 MED ORDER — BUPIVACAINE HCL (PF) 0.25 % IJ SOLN
INTRAMUSCULAR | Status: DC | PRN
  Administered 2016-10-22: 10 mL

## 2016-10-22 MED ORDER — PROPOFOL 10 MG/ML IV BOLUS
INTRAVENOUS | Status: DC | PRN
  Administered 2016-10-22: 150 mg via INTRAVENOUS

## 2016-10-22 MED ORDER — FENTANYL CITRATE (PF) 100 MCG/2ML IJ SOLN
25.0000 ug | INTRAMUSCULAR | Status: DC | PRN
  Administered 2016-10-22 (×3): 50 ug via INTRAVENOUS

## 2016-10-22 MED ORDER — MIDAZOLAM HCL 5 MG/5ML IJ SOLN
INTRAMUSCULAR | Status: DC | PRN
Start: 2016-10-22 — End: 2016-10-22
  Administered 2016-10-22: 2 mg via INTRAVENOUS

## 2016-10-22 MED ORDER — DEXAMETHASONE SODIUM PHOSPHATE 10 MG/ML IJ SOLN
INTRAMUSCULAR | Status: AC
  Filled 2016-10-22: qty 2

## 2016-10-22 MED ORDER — CEFAZOLIN SODIUM-DEXTROSE 2-4 GM/100ML-% IV SOLN
2.0000 g | Freq: Once | INTRAVENOUS | Status: AC
  Administered 2016-10-22: 2 g via INTRAVENOUS

## 2016-10-22 MED ORDER — BUPIVACAINE HCL (PF) 0.25 % IJ SOLN
INTRAMUSCULAR | Status: AC
  Filled 2016-10-22: qty 30

## 2016-10-22 MED ORDER — DEXTROSE 5 % IV SOLN
INTRAVENOUS | Status: DC | PRN
  Administered 2016-10-22: 50 ug/min via INTRAVENOUS

## 2016-10-22 MED ORDER — ONDANSETRON HCL 4 MG/2ML IJ SOLN
INTRAMUSCULAR | Status: AC
  Filled 2016-10-22: qty 2

## 2016-10-22 MED ORDER — METFORMIN HCL 500 MG PO TABS
500.0000 mg | ORAL_TABLET | Freq: Every day | ORAL | Status: DC
  Administered 2016-10-23: 500 mg via ORAL
  Filled 2016-10-22: qty 1

## 2016-10-22 MED ORDER — CEFAZOLIN SODIUM-DEXTROSE 2-4 GM/100ML-% IV SOLN
INTRAVENOUS | Status: AC
  Filled 2016-10-22: qty 100

## 2016-10-22 MED ORDER — ROCURONIUM BROMIDE 10 MG/ML (PF) SYRINGE
PREFILLED_SYRINGE | INTRAVENOUS | Status: AC
  Filled 2016-10-22: qty 5

## 2016-10-22 MED ORDER — METHOCARBAMOL 500 MG PO TABS: 500.0000 mg | ORAL_TABLET | Freq: Four times a day (QID) | ORAL | Status: DC | PRN

## 2016-10-22 MED ORDER — PHENYLEPHRINE HCL 10 MG/ML IJ SOLN
INTRAMUSCULAR | Status: DC | PRN
  Administered 2016-10-22 (×3): 80 ug via INTRAVENOUS

## 2016-10-22 MED ORDER — LIDOCAINE 2% (20 MG/ML) 5 ML SYRINGE
INTRAMUSCULAR | Status: AC
  Filled 2016-10-22: qty 5

## 2016-10-22 MED ORDER — ROCURONIUM BROMIDE 100 MG/10ML IV SOLN
INTRAVENOUS | Status: DC | PRN
  Administered 2016-10-22: 10 mg via INTRAVENOUS
  Administered 2016-10-22: 40 mg via INTRAVENOUS

## 2016-10-22 MED ORDER — FENTANYL CITRATE (PF) 250 MCG/5ML IJ SOLN
INTRAMUSCULAR | Status: AC
  Filled 2016-10-22: qty 5

## 2016-10-22 MED ORDER — OXYCODONE HCL 5 MG PO TABS
5.0000 mg | ORAL_TABLET | ORAL | Status: DC | PRN
  Administered 2016-10-22: 10 mg via ORAL

## 2016-10-22 MED ORDER — SUGAMMADEX SODIUM 200 MG/2ML IV SOLN
INTRAVENOUS | Status: DC | PRN
  Administered 2016-10-22: 350 mg via INTRAVENOUS

## 2016-10-22 MED ORDER — PHENYLEPHRINE 40 MCG/ML (10ML) SYRINGE FOR IV PUSH (FOR BLOOD PRESSURE SUPPORT)
PREFILLED_SYRINGE | INTRAVENOUS | Status: AC
  Filled 2016-10-22: qty 10

## 2016-10-22 MED ORDER — ALBUMIN HUMAN 5 % IV SOLN
INTRAVENOUS | Status: DC | PRN
  Administered 2016-10-22: 14:00:00 via INTRAVENOUS

## 2016-10-22 MED ORDER — FENTANYL CITRATE (PF) 100 MCG/2ML IJ SOLN
INTRAMUSCULAR | Status: DC | PRN
Start: 2016-10-22 — End: 2016-10-22
  Administered 2016-10-22: 100 ug via INTRAVENOUS

## 2016-10-22 MED ORDER — ONDANSETRON HCL 4 MG/2ML IJ SOLN: 4.0000 mg | Freq: Four times a day (QID) | INTRAMUSCULAR | Status: DC | PRN

## 2016-10-22 MED ORDER — CEFAZOLIN SODIUM-DEXTROSE 2-4 GM/100ML-% IV SOLN
2.0000 g | Freq: Three times a day (TID) | INTRAVENOUS | Status: AC
  Administered 2016-10-22 (×2): 2 g via INTRAVENOUS
  Filled 2016-10-22 (×2): qty 100

## 2016-10-22 MED ORDER — MORPHINE SULFATE (PF) 4 MG/ML IV SOLN: 2.0000 mg | INTRAVENOUS | Status: DC | PRN

## 2016-10-22 MED ORDER — PROPOFOL 10 MG/ML IV BOLUS
INTRAVENOUS | Status: AC
  Filled 2016-10-22: qty 20

## 2016-10-22 MED ORDER — SODIUM CHLORIDE 0.9 % IV SOLN
INTRAVENOUS | Status: DC
  Administered 2016-10-22: 19:00:00 via INTRAVENOUS

## 2016-10-22 MED ORDER — INSULIN ASPART 100 UNIT/ML ~~LOC~~ SOLN: 0.0000 [IU] | Freq: Three times a day (TID) | SUBCUTANEOUS | Status: DC

## 2016-10-22 MED ORDER — FENTANYL CITRATE (PF) 100 MCG/2ML IJ SOLN
INTRAMUSCULAR | Status: AC
  Filled 2016-10-22: qty 2

## 2016-10-22 MED ORDER — LACTATED RINGERS IV SOLN
INTRAVENOUS | Status: DC
  Administered 2016-10-22: 11:00:00 via INTRAVENOUS

## 2016-10-22 MED ORDER — ONDANSETRON 4 MG PO TBDP: 4.0000 mg | ORAL_TABLET | Freq: Four times a day (QID) | ORAL | Status: DC | PRN

## 2016-10-22 SURGICAL SUPPLY — 55 items
ADH SKN CLS APL DERMABOND .7 (GAUZE/BANDAGES/DRESSINGS) ×1
BLADE CLIPPER SURG (BLADE) ×3 IMPLANT
BLADE SURG 10 STRL SS (BLADE) ×3 IMPLANT
BLADE SURG 15 STRL LF DISP TIS (BLADE) ×1 IMPLANT
BLADE SURG 15 STRL SS (BLADE) ×3
CANISTER SUCT 3000ML PPV (MISCELLANEOUS) IMPLANT
CHLORAPREP W/TINT 26ML (MISCELLANEOUS) ×3 IMPLANT
CLOSURE WOUND 1/2 X4 (GAUZE/BANDAGES/DRESSINGS) ×1
COVER SURGICAL LIGHT HANDLE (MISCELLANEOUS) ×3 IMPLANT
DECANTER SPIKE VIAL GLASS SM (MISCELLANEOUS) IMPLANT
DERMABOND ADVANCED (GAUZE/BANDAGES/DRESSINGS) ×2
DERMABOND ADVANCED .7 DNX12 (GAUZE/BANDAGES/DRESSINGS) ×1 IMPLANT
DRAIN PENROSE 1/2X12 LTX STRL (WOUND CARE) ×3 IMPLANT
DRAPE LAPAROTOMY TRNSV 102X78 (DRAPE) ×3 IMPLANT
DRAPE UTILITY XL STRL (DRAPES) ×3 IMPLANT
ELECT CAUTERY BLADE 6.4 (BLADE) ×3 IMPLANT
ELECT REM PT RETURN 9FT ADLT (ELECTROSURGICAL) ×3
ELECTRODE REM PT RTRN 9FT ADLT (ELECTROSURGICAL) ×1 IMPLANT
GAUZE SPONGE 4X4 16PLY XRAY LF (GAUZE/BANDAGES/DRESSINGS) IMPLANT
GLOVE BIO SURGEON STRL SZ7 (GLOVE) ×3 IMPLANT
GLOVE BIOGEL PI IND STRL 6.5 (GLOVE) IMPLANT
GLOVE BIOGEL PI IND STRL 7.5 (GLOVE) ×1 IMPLANT
GLOVE BIOGEL PI INDICATOR 6.5 (GLOVE) ×2
GLOVE BIOGEL PI INDICATOR 7.5 (GLOVE) ×2
GLOVE SURG SS PI 6.0 STRL IVOR (GLOVE) ×3 IMPLANT
GOWN STRL REUS W/ TWL LRG LVL3 (GOWN DISPOSABLE) ×2 IMPLANT
GOWN STRL REUS W/TWL LRG LVL3 (GOWN DISPOSABLE) ×6
KIT BASIN OR (CUSTOM PROCEDURE TRAY) ×3 IMPLANT
KIT ROOM TURNOVER OR (KITS) ×3 IMPLANT
MARKER SKIN DUAL TIP RULER LAB (MISCELLANEOUS) ×3 IMPLANT
MESH ULTRAPRO 3X6 7.6X15CM (Mesh General) ×3 IMPLANT
NEEDLE HYPO 25GX1X1/2 BEV (NEEDLE) ×3 IMPLANT
NS IRRIG 1000ML POUR BTL (IV SOLUTION) ×3 IMPLANT
PACK SURGICAL SETUP 50X90 (CUSTOM PROCEDURE TRAY) ×3 IMPLANT
PAD ARMBOARD 7.5X6 YLW CONV (MISCELLANEOUS) ×3 IMPLANT
PENCIL BUTTON HOLSTER BLD 10FT (ELECTRODE) ×3 IMPLANT
SPONGE LAP 18X18 X RAY DECT (DISPOSABLE) ×3 IMPLANT
STRIP CLOSURE SKIN 1/2X4 (GAUZE/BANDAGES/DRESSINGS) ×2 IMPLANT
SUT MNCRL AB 4-0 PS2 18 (SUTURE) ×3 IMPLANT
SUT VIC AB 0 CT1 18XCR BRD 8 (SUTURE) ×1 IMPLANT
SUT VIC AB 0 CT1 27 (SUTURE)
SUT VIC AB 0 CT1 27XBRD ANBCTR (SUTURE) IMPLANT
SUT VIC AB 0 CT1 8-18 (SUTURE) ×3
SUT VIC AB 2-0 CT1 27 (SUTURE) ×3
SUT VIC AB 2-0 CT1 TAPERPNT 27 (SUTURE) ×1 IMPLANT
SUT VIC AB 2-0 SH 18 (SUTURE) ×1 IMPLANT
SUT VIC AB 3-0 SH 27 (SUTURE) ×3
SUT VIC AB 3-0 SH 27XBRD (SUTURE) ×1 IMPLANT
SUT VICRYL AB 2 0 TIES (SUTURE) ×3 IMPLANT
SYR CONTROL 10ML LL (SYRINGE) ×3 IMPLANT
TOWEL OR 17X24 6PK STRL BLUE (TOWEL DISPOSABLE) ×3 IMPLANT
TOWEL OR 17X26 10 PK STRL BLUE (TOWEL DISPOSABLE) ×3 IMPLANT
TUBE CONNECTING 12'X1/4 (SUCTIONS)
TUBE CONNECTING 12X1/4 (SUCTIONS) IMPLANT
YANKAUER SUCT BULB TIP NO VENT (SUCTIONS) IMPLANT

## 2016-10-22 NOTE — Transfer of Care (Signed)
Immediate Anesthesia Transfer of Care Note  Patient: Whitney [name]  Procedure(s) Performed: Procedure(s) with comments: LEFT INGUINAL HERNIA REPAIR (Left) - TAP BLOCK INSERTION OF MESH (Left)  Patient Location: PACU  Anesthesia Type:General  Level of Consciousness: awake, alert  and oriented  Airway & Oxygen Therapy: Patient Spontanous Breathing and Patient connected to face mask oxygen  Post-op Assessment: Report given to RN, Post -op Vital signs reviewed and stable and Patient moving all extremities X 4  Post vital signs: Reviewed and stable  Last Vitals:  Vitals:   10/22/16 1043 10/22/16 1045  BP:  110/72  Pulse: 72   Resp: 20   Temp: 37 C     Last Pain:  Vitals:   10/22/16 1043  TempSrc: Oral      Patients Stated Pain Goal: 8 (31/74/09 9278)  Complications: No apparent anesthesia complications

## 2016-10-22 NOTE — Anesthesia Preprocedure Evaluation (Addendum)
Anesthesia Evaluation  Patient identified by MRN, date of birth, ID band Patient awake    Reviewed: Allergy & Precautions, NPO status , Patient's Chart, lab work & pertinent test results  Airway Mallampati: II  TM Distance: >3 FB     Dental   Pulmonary asthma , sleep apnea , pneumonia,    breath sounds clear to auscultation       Cardiovascular + Valvular Problems/Murmurs  Rhythm:Regular Rate:Normal     Neuro/Psych PSYCHIATRIC DISORDERS    GI/Hepatic hiatal hernia, GERD  Medicated and Controlled,(+) Hepatitis -  Endo/Other  diabetes  Renal/GU negative Renal ROS     Musculoskeletal   Abdominal   Peds  Hematology  (+) anemia ,   Anesthesia Other Findings - Left ventricle: The cavity size was normal. There was mild focal   basal hypertrophy of the septum. Systolic function was normal.   The estimated ejection fraction was in the range of 60% to 65%.   Wall motion was normal; there were no regional wall motion   abnormalities. Doppler parameters are consistent with abnormal   left ventricular relaxation (grade 1 diastolic dysfunction). - Aortic valve: Cusp separation was reduced. There was severe   stenosis. There was trivial regurgitation. Mean gradient (S): 43   mm Hg. Peak gradient (S): 72 mm Hg. Valve area (VTI): 0.95 cm^2.   Valve area (Vmax): 0.97 cm^2. Valve area (Vmean): 1.03 cm^2. - Mitral valve: Calcified annulus. Mildly thickened leaflets . The   findings are consistent with mild stenosis. Valve area by   pressure half-time: 1.95 cm^2. Valve area by continuity equation   (using LVOT flow): 2.66 cm^2. - Left atrium: The atrium was moderately dilated.  Reproductive/Obstetrics                            Anesthesia Physical Anesthesia Plan  ASA: III  Anesthesia Plan: General   Post-op Pain Management:    Induction: Intravenous  PONV Risk Score and Plan: 2 and Ondansetron and  Dexamethasone  Airway Management Planned: LMA  Additional Equipment:   Intra-op Plan:   Post-operative Plan: Extubation in OR  Informed Consent: I have reviewed the patients History and Physical, chart, labs and discussed the procedure including the risks, benefits and alternatives for the proposed anesthesia with the patient or authorized representative who has indicated his/her understanding and acceptance.   Dental advisory given  Plan Discussed with: CRNA and Anesthesiologist  Anesthesia Plan Comments:        Anesthesia Quick Evaluation

## 2016-10-22 NOTE — Progress Notes (Signed)
Dr. Donne Hazel aware that pt. 's last CBG- 72, pt. Reports that she is feeling fine.

## 2016-10-22 NOTE — Anesthesia Postprocedure Evaluation (Signed)
Anesthesia Post Note  Patient: Whitney [name]  Procedure(s) Performed: Procedure(s) (LRB): LEFT INGUINAL HERNIA REPAIR (Left) INSERTION OF MESH (Left)     Patient location during evaluation: PACU Anesthesia Type: General Level of consciousness: awake Pain management: pain level controlled Vital Signs Assessment: post-procedure vital signs reviewed and stable Respiratory status: spontaneous breathing Cardiovascular status: stable Anesthetic complications: no    Last Vitals:  Vitals:   10/22/16 1506 10/22/16 1521  BP: 114/65 102/65  Pulse: 61 75  Resp: 16 14  Temp:      Last Pain:  Vitals:   10/22/16 1542  TempSrc:   PainSc: 6                  Anushka Hartinger

## 2016-10-22 NOTE — Op Note (Signed)
Preoperative diagnosis: left inguinal hernia Postoperative diagnosis: left direct inguinal hernia Procedure: Left inguinal hernia repair with Ultrapro mesh Surgeon: Dr Serita Grammes EBL: 20 cc Specimens none Drains none anes general Sponge and needle count correct dispo to recovery stable  Indications: This is a 55 yof with liver disease who presents with symptomatic left inguinal hernia.  She has thrombocytopenia.  She has been seen preoperatively by cardiology.  I discussed an open procedure with mesh for her as I thought this would be simplest repair for her.    Procedure: After informed consent was obtained the patient was taken to the operating room. She was given antibiotics. SCDs were in place. She was placed under general anesthesia without complication. She was prepped and draped in the standard sterile surgical fashion. A surgical timeout was then performed.  I infiltrated Marcaine throughout the left groin. I did an ilioinguinal nerve block. I then made a left groin incision. I carried this through the subcutaneous tissue. I ligated the superficial epigastric vein identified the external abdominal oblique. I incised this through the external ring. She was noted to have a lipoma as part of her round ligament. I excised this. I then divided the round ligament. She really had no floor to her groin. She had a direct hernia. I then close the internal oblique down to the shelving edge. I placed an UltraPro mesh patch overlying this entire area. Using 0 Vicryl suture I sutured this to the pubic tubercle in 2 positions. I then sutured it every half centimeter to the shelving edge inferiorly. I sutured it to the oblique superiorly. I then laid the lateral portion flat. This covered the entire defect. Then obtained hemostasis. I then closed the external abdominal oblique with 2-0 Vicryl. Scarpa's fascia was closed with 2-0 Vicryl. The skin was closed for Monocryl and glue was placed. She tolerated  this was extubated and transferred to recovery stable.

## 2016-10-22 NOTE — H&P (Signed)
  61 yof with nash associated cirrhosis who is followed by Dr Gerald Dexter at [facility] with hepatology. she has undergone sleeve gastrectomy for mo in March 2015 and has done well with that. she now has much more symptomatic left groin hernia that is getting hard and preventing her from walking. this is happening more frequently. she also has some occasional right groin pain without a bulge. she notes some chest pain on left that is more near shoulder that she has not had evaluated. She has had kidney stone issues resolved now.   Past Surgical History  Sleeve Gastrectomy  Tonsillectomy   Diagnostic Studies History  Mammogram  1-3 years ago Pap Smear  1-5 years ago  Allergies  Doxycycline *DERMATOLOGICALS*  Naprosyn *ANALGESICS - ANTI-INFLAMMATORY*   Medication History  Ferrous Sulfate (325 (65 Fe)MG Tablet, Oral) Active. MetFORMIN HCl (500MG Tablet, Oral) Active. Nascobal (500MCG/0.1ML Solution, Nasal) Active. Pantoprazole Sodium (20MG Tablet DR, Oral) Active. BuPROPion HCl ER (SR) (150MG Tablet ER 12HR, Oral) Active. Biotin (1MG Capsule, Oral) Active. Vitamin D (1000UNIT Tablet, Oral) Active. Colace (100MG Capsule, Oral) Active. Multivitamins (Oral) Active. Milk Thistle Active. Medications Reconciled  Social History  Caffeine use  Coffee, Tea. No alcohol use  No drug use  Tobacco use  Never smoker.  Family History  Arthritis  Brother, Daughter, Mother, Sister. Depression  Father, Mother, Son. Diabetes Mellitus  Father. Heart Disease  Father. Hypertension  Brother, Daughter, Father, Son. Prostate Cancer  Father. Respiratory Condition  Mother, Sister. Thyroid problems  Mother.  Pregnancy / Birth History  Age at menarche  26 years. Age of menopause  [age] Contraceptive History  Intrauterine device. Gravida  2 Maternal age  59-63 Para  2 Regular periods   Other Problems Asthma  Cirrhosis Of Liver  Diabetes Mellitus  Heart murmur   Kidney Stone   Vitals  Weight: 201 lb Height: 63in Body Surface Area: 1.94 m Body Mass Index: 35.61 kg/m  Temp.: 98.38F  Pulse: 79 (Regular)  BP: 108/78 (Sitting, Left Arm, Standard) Physical Exam  General Mental Status-Alert. Orientation-Oriented X3. Eye Sclera/Conjunctiva - Bilateral-No scleral icterus. Chest and Lung Exam Chest and lung exam reveals -on auscultation, normal breath sounds, no adventitious sounds and normal vocal resonance. Cardiovascular Inspection Jugular vein - Bilateral - No Distention. Note: aortic murmur Abdomen Note: soft nt/nd tender reducible lih, no rih Lymphatic Head & Neck General Head & Neck Lymphatics: Bilateral - Description - Normal.   Assessment & Plan  LEFT INGUINAL HERNIA (K40.90) LIH  would plan for open lih given other issues as I think this will be easiest, most efficient way to fix this hernia for her. will stay overnight. We discussed both laparoscopic and open inguinal hernia repairs. I described the procedure in detail. Goals should be achieved with surgery. We discussed the usage of mesh and the rationale behind that. We went over the pathophysiology of inguinal hernia. We have elected to perform open inguinal hernia repair with mesh. We discussed the risks including bleeding, infection, recurrence, postoperative pain and chronic groin pain, urinary retention, numbness in groin and around incision.

## 2016-10-22 NOTE — Interval H&P Note (Signed)
History and Physical Interval Note:  10/22/2016 1:09 PM  Whitney [name]  has presented today for surgery, with the diagnosis of Left Inguinal Hernia  The various methods of treatment have been discussed with the patient and family. After consideration of risks, benefits and other options for treatment, the patient has consented to  Procedure(s) with comments: LEFT INGUINAL HERNIA REPAIR WITH MESH (Left) - TAP BLOCK INSERTION OF MESH (Left) as a surgical intervention .  The patient's history has been reviewed, patient examined, no change in status, stable for surgery.  I have reviewed the patient's chart and labs.  Questions were answered to the patient's satisfaction.     [name]

## 2016-10-22 NOTE — Discharge Instructions (Signed)
[facility] ° °UMBILICAL OR INGUINAL HERNIA REPAIR: POST OP INSTRUCTIONS ° °Always review your discharge instruction sheet given to you by the facility where your surgery was performed. °IF YOU HAVE DISABILITY OR FAMILY LEAVE FORMS, YOU MUST BRING THEM TO THE OFFICE FOR PROCESSING.   °DO NOT GIVE THEM TO YOUR DOCTOR. ° °1. A  prescription for pain medication may be given to you upon discharge.  Take your pain medication as prescribed, if needed.  If narcotic pain medicine is not needed, then you may take acetaminophen (Tylenol), naprosyn (Alleve) or ibuprofen (Advil) as needed. °2. Take your usually prescribed medications unless otherwise directed. °3. If you need a refill on your pain medication, please contact your pharmacy.  They will contact our office to request authorization. Prescriptions will not be filled after 5 pm or on week-ends. °4. You should follow a light diet the first 24 hours after arrival home, such as soup and crackers, etc.  Be sure to include lots of fluids daily.  Resume your normal diet the day after surgery. °5. Most patients will experience some swelling and bruising around the umbilicus or in the groin and scrotum.  Ice packs and reclining will help.  Swelling and bruising can take several days to resolve.  °6. It is common to experience some constipation if taking pain medication after surgery.  Increasing fluid intake and taking a stool softener (such as Colace) will usually help or prevent this problem from occurring.  A mild laxative (Milk of Magnesia or Miralax) should be taken according to package directions if there are no bowel movements after 48 hours. °7. Unless discharge instructions indicate otherwise, you may remove your bandages 48 hours after surgery, and you may shower at that time.  You may have steri-strips (small skin tapes) in place directly over the incision.  These strips should be left on the skin for 7-10 days and will come off on their own.   If your surgeon used skin glue on the incision, you may shower in 24 hours.  The glue will flake off over the next 2-3 weeks.  Any sutures or staples will be removed at the office during your follow-up visit. °8. ACTIVITIES:  You may resume regular (light) daily activities beginning the next day--such as daily self-care, walking, climbing stairs--gradually increasing activities as tolerated.  You may have sexual intercourse when it is comfortable.  Refrain from any heavy lifting or straining until approved by your doctor. °a. You may drive when you are no longer taking prescription pain medication, you can comfortably wear a seatbelt, and you can safely maneuver your car and apply brakes. °b. RETURN TO WORK:  __________________________________________________________ °9. You should see your doctor in the office for a follow-up appointment approximately 2-3 weeks after your surgery.  Make sure that you call for this appointment within a day or two after you arrive home to insure a convenient appointment time. °10. OTHER INSTRUCTIONS:  __________________________________________________________________________________________________________________________________________________________________________________________  °WHEN TO CALL YOUR DOCTOR: °1. Fever over 101.0 °2. Inability to urinate °3. Nausea and/or vomiting °4. Extreme swelling or bruising °5. Continued bleeding from incision. °6. Increased pain, redness, or drainage from the incision ° °The clinic staff is available to answer your questions during regular business hours.  Please don’t hesitate to call and ask to speak to one of the nurses for clinical concerns.  If you have a medical emergency, go to the nearest emergency room or call 911.  A surgeon from [facility]   is always on call at the hospital ° ° °[address], [address], [place], [place]  [address] ? ° [address] [address], [place], [place]   [address] °[phone] ? [phone] ? FAX  [phone] °Web site: [email or web address] ° ° °

## 2016-10-22 NOTE — Anesthesia Procedure Notes (Signed)
Procedure Name: Intubation Date/Time: 10/22/2016 1:25 PM Performed by: Neldon [place] Pre-anesthesia Checklist: Timeout performed, Patient being monitored, Suction available, Emergency Drugs available and Patient identified Patient Re-evaluated:Patient Re-evaluated prior to inductionOxygen Delivery Method: Circle system utilized Preoxygenation: Pre-oxygenation with 100% oxygen Intubation Type: IV induction Ventilation: Mask ventilation without difficulty Laryngoscope Size: Mac and 3 Grade View: Grade II Tube type: Oral Tube size: 7.0 mm Number of attempts: 1 Placement Confirmation: breath sounds checked- equal and bilateral,  ETT inserted through vocal cords under direct vision and positive ETCO2 Secured at: 21 cm Tube secured with: Tape Dental Injury: Teeth and Oropharynx as per pre-operative assessment

## 2016-10-23 ENCOUNTER — Encounter (HOSPITAL_COMMUNITY): Payer: Self-pay | Admitting: General Surgery

## 2016-10-23 DIAGNOSIS — K4090 Unilateral inguinal hernia, without obstruction or gangrene, not specified as recurrent: Secondary | ICD-10-CM | POA: Diagnosis not present

## 2016-10-23 LAB — PROTIME-INR
INR: 1.23
Prothrombin Time: 15.5 seconds — ABNORMAL HIGH (ref 11.4–15.2)

## 2016-10-23 LAB — COMPREHENSIVE METABOLIC PANEL
ALBUMIN: 3 g/dL — AB (ref 3.5–5.0)
ALT: 19 U/L (ref 14–54)
ANION GAP: 6 (ref 5–15)
AST: 31 U/L (ref 15–41)
Alkaline Phosphatase: 47 U/L (ref 38–126)
BILIRUBIN TOTAL: 1 mg/dL (ref 0.3–1.2)
BUN: 13 mg/dL (ref 6–20)
CHLORIDE: 104 mmol/L (ref 101–111)
CO2: 27 mmol/L (ref 22–32)
Calcium: 8.7 mg/dL — ABNORMAL LOW (ref 8.9–10.3)
Creatinine, Ser: 0.78 mg/dL (ref 0.44–1.00)
GFR calc Af Amer: >60 mL/min (ref >60)
GLUCOSE: 111 mg/dL — AB (ref 65–99)
POTASSIUM: 4.8 mmol/L (ref 3.5–5.1)
Sodium: 137 mmol/L (ref 135–145)
TOTAL PROTEIN: 5.6 g/dL — AB (ref 6.5–8.1)

## 2016-10-23 LAB — CBC
HEMATOCRIT: 38.8 % (ref 36.0–46.0)
Hemoglobin: 12.6 g/dL (ref 12.0–15.0)
MCH: 29 pg (ref 26.0–34.0)
MCHC: 32.5 g/dL (ref 30.0–36.0)
MCV: 89.4 fL (ref 78.0–100.0)
PLATELETS: 74 10*3/uL — AB (ref 150–400)
RBC: 4.34 MIL/uL (ref 3.87–5.11)
RDW: 14.1 % (ref 11.5–15.5)
WBC: 4.3 10*3/uL (ref 4.0–10.5)

## 2016-10-23 LAB — GLUCOSE, CAPILLARY: GLUCOSE-CAPILLARY: 92 mg/dL (ref 65–99)

## 2016-10-23 MED ORDER — OXYCODONE HCL 5 MG PO TABS: 5.0000 mg | ORAL_TABLET | ORAL | 0 refills | Status: DC | PRN

## 2016-10-23 NOTE — Discharge Planning (Signed)
Patient discharged home in stable condition. Verbalizes understanding of all discharge instructions, including home medications and follow up appointments. 

## 2016-10-23 NOTE — Progress Notes (Signed)
Patient ID: [name], female   DOB: 10-Feb-1956, 61 y.o.   MRN: [id number] [place] reviewed day of dc, postop rx given

## 2016-10-23 NOTE — Discharge Summary (Signed)
Physician Discharge Summary  Patient ID: Whitney [name] MRN: [id number] DOB/AGE: 1956/02/19 61 y.o.  Admit date: 10/22/2016 Discharge date: 10/23/2016  Admission Diagnoses: NASH/cirrhosis sjogrens syndrome Left inguinal hernia Diabetes  Discharge Diagnoses:  Active Problems:   Inguinal hernia   Discharged Condition: good  Hospital Course: 39 yof with multiple medical issues underwent left inguinal hernia repair with mesh for symptomatic hernia. She did well overnight and labs are fine postop.  Will dc home today on all home meds  Consults: None  Significant Diagnostic Studies: none  Treatments: surgery: lih with mesh  Discharge Exam: Blood pressure 103/63, pulse 63, temperature 98.4 F (36.9 C), temperature source Oral, resp. rate 16, height 5' 4"  (1.626 m), weight 93 kg (205 lb), SpO2 96 %. groin clean without infection, no hematoma  Disposition: 01-Home or Self Care   Allergies as of 10/23/2016      Reactions   Doxycycline Hives   Naprosyn [naproxen] Rash, Hives      Medication List    TAKE these medications   Biotin 10000 MCG Tabs Take 10,000 Units by mouth daily.   buPROPion 150 MG 12 hr tablet Commonly known as:  WELLBUTRIN SR Take 150 mg by mouth daily.   docusate sodium 100 MG capsule Commonly known as:  COLACE Take 100 mg by mouth daily.   ezetimibe 10 MG tablet Commonly known as:  ZETIA Take 10 mg by mouth every evening.   metFORMIN 500 MG tablet Commonly known as:  GLUCOPHAGE Take 500 mg by mouth daily with breakfast.   MILK THISTLE PO Take 1 tablet by mouth daily.   Multi Vitamin Tabs Take 3 tablets by mouth 2 (two) times daily. BARIATRIC MULTI VIT   oxyCODONE 5 MG immediate release tablet Commonly known as:  Oxy IR/ROXICODONE Take 1 tablet (5 mg total) by mouth every 4 (four) hours as needed for moderate pain.   UNABLE TO FIND Place 1 tablet under the tongue 3 (three) times a week. Iron supplement   Vitamin B-12 5000 MCG Subl Place  5,000 mcg under the tongue daily.   Vitamin D 2000 units Caps Take 2,000 Units by mouth daily.      Follow-up Information    Rolm Bookbinder, MD Follow up in 3 week(s).   Specialty:  General Surgery Contact information: [address] [place] [address] [phone]           Signed: Rolm Bookbinder 10/23/2016, 7:32 AM

## 2017-07-31 NOTE — H&P (View-Only) (Signed)
Cardiology Office Note   Date:  08/01/2017   ID:  Whitney, [name] August 17, 1955, MRN [id number]  PCP:  Whitney Fess, MD    No chief complaint on file.  Aortic stenosis  Wt Readings from Last 3 Encounters:  08/01/17 211 lb (95.7 kg)  10/22/16 205 lb (93 kg)  09/28/16 205 lb 7.5 oz (93.2 kg)       History of Present Illness: Whitney [name] is a 62 y.o. female  who has had aortic stenosis. SHe has gained weight since weight loss surgery.  She has NASH and is treated at [name]. Albumin was low on recent check at 3.2. Other LFTshave been slightly elevated.  This has progressed to cirrhosis but she has not had sx of liver disease.   She had gastric sleeve surgery in 2015 and has lost a lot of weight.  She lost 100 lbs with the surgery.   Her son also had fatty liver and subsequent weight loss surgery. Her son has severe depression and PTSD.   She retired in 2016, and gained some weight.  Swelling has not been an issue of late.  In 2018, she had hernia repair. She has had aortic stenosis which has progressed to severe range as of March 2017. She has been asymptomatic from a cardiac standpoint. She denied any exertional chest discomfort. She had not had any signs of heart failure including orthopnea, fluid retention. She denied any lightheadedness or syncope.  Prior evaluations for cardiac ischemia were negative in the past. She tolerated the hernia surgery well. No issues.    Since the last visit, she has had some left side chest pain.  It is better with pushing on it.  In the last 6 months, her fibromyalgia has gotten worse.  She fels that the chest pain is related to her fibromyalgia.  She had one episode of lightheadedness while eating.  It resolved quickly.  She is walking in th ehouse.  She is unpacking her house.  She is starting Silver sneakers.    Denies : Exrtional chest pain, Leg edema. Nitroglycerin use. Orthopnea. Palpitations. Paroxysmal nocturnal dyspnea.  Shortness of breath. Syncope.       Past Medical History:  Diagnosis Date  . Allergic rhinitis   . Anemia    hx  . Arthritis   . Asthma    hx yrs ago  . Cirrhosis ([place]) last albumin 3.3 done at [name] 06-16-2014 (under care everywhere tab in epic)   Secondary to Fatty liver --  followed by hepatology at [name] ([name])   . Depression   . Diabetes mellitus type II   . Fibromyalgia   . GERD (gastroesophageal reflux disease)   . Heart murmur    asymptomatic ---  1989 from rhuematic fever  . History of exercise stress test    05-05-2013---  negative bruce ETT given exercise workload,  no ischemia  . History of hiatal hernia   . History of kidney stones   . History of rheumatic fever    1989  . Hyperlipidemia   . Leukocytopenia   . Moderate aortic stenosis    AVA area 1.1cm2---  cardiologist --  [name], 2014 in epic  . NASH (nonalcoholic steatohepatitis)   . OSA (obstructive sleep apnea)    was using CPAP before gastric sleeve 2015--  no uses after wt loss  . Pneumonia    hx  . Sjogren's syndrome ([name])   . Thrombocytopenia ([place])  Past Surgical History:  Procedure Laterality Date  . CYSTOSCOPY WITH RETROGRADE PYELOGRAM, URETEROSCOPY AND STENT PLACEMENT Left 01/21/2015   Procedure: CYSTOSCOPY WITH LEFT  RETROGRADE PYELOGRAM, LEFT URETEROSCOPY AND STENT PLACEMENT;  Surgeon: Festus Aloe, MD;  Location[name];  Service: Urology;  Laterality: Left;  . CYSTOSCOPY WITH RETROGRADE PYELOGRAM, URETEROSCOPY AND STENT PLACEMENT Bilateral 02/24/2015   Procedure: CYSTOSCOPY WITH RIGHT RETROGRADE PYELOGRAM, BLADDER BIOPSY FULGERATION LEFT URETEROSCOPY AND STENT REPLACEMENT;  Surgeon: Festus Aloe, MD;  Location: [facility];  Service: Urology;  Laterality: Bilateral;  . EXPLORATORY LAPAROSCOPY W/  CONE BIOPSY'S LEFT AND RIGHT LOBE OF LIVER  11-04-2007  . HOLMIUM LASER APPLICATION Left 64/33/2951   Procedure: HOLMIUM LASER LITHOTRIPSY;   Surgeon: Festus Aloe, MD;  Location: [facility];  Service: Urology;  Laterality: Left;  . HYSTEROSCOPY W/D&C N/A 12/11/2012   Procedure: DILATATION AND CURETTAGE /HYSTEROSCOPY;  Surgeon: Maeola Sarah. Landry Mellow, MD;  Location: [facility];  Service: Gynecology;  Laterality: N/A;  . INGUINAL HERNIA REPAIR Left 10/22/2016   Procedure: LEFT INGUINAL HERNIA REPAIR;  Surgeon: Rolm Bookbinder, MD;  Location: [facility];  Service: General;  Laterality: Left;  TAP BLOCK  . INSERTION OF MESH Left 10/22/2016   Procedure: INSERTION OF MESH;  Surgeon: Rolm Bookbinder, MD;  Location: [place];  Service: General;  Laterality: Left;  . LAPAROSCOPIC GASTRIC SLEEVE RESECTION  07-27-2013  . PUBOVAGINAL SLING  04-10-2001   [place]  . TONSILLECTOMY  1975  . TRANSTHORACIC ECHOCARDIOGRAM  06-04-2012  [facility] Lack   mild LVH,  grade I diastolic dysfunction/  ef 60-65%/  moderate LAE/  mild MV calcifation without stenosis,  mild MR/  moderate AV stenosis,  cannot r/o bicupsid, area 1.1cm2/  mild dilated aortic root/  trivial TR     Current Outpatient Medications  Medication Sig Dispense Refill  . Biotin 10000 MCG TABS Take 10,000 Units by mouth daily.    . Cholecalciferol (VITAMIN D) 2000 UNITS CAPS Take 2,000 Units by mouth daily.    . Cyanocobalamin (VITAMIN B-12) 5000 MCG SUBL Place 5,000 mcg under the tongue daily.    Marland Kitchen ezetimibe (ZETIA) 10 MG tablet Take 10 mg by mouth every evening.   11  . Multiple Vitamin (MULTI VITAMIN) TABS Take 3 tablets by mouth 2 (two) times daily. BARIATRIC MULTI VIT     No current facility-administered medications for this visit.     Allergies:   Doxycycline and Naproxen    Social History:  The patient  reports that she has never smoked. She has never used smokeless tobacco. She reports that she drinks alcohol. She reports that she does not use drugs.   Family History:  The patient's family history includes Allergies in her daughter; Alzheimer's disease in her father; Asthma in  her daughter, father, and paternal grandmother; Heart attack in her father; Heart disease in her father and mother; Hip fracture in her father; Hypertension in her father; Rheum arthritis in her mother.    ROS:  Please see the history of present illness.   Otherwise, review of systems are positive for one episode of lightheadedness.   All other systems are reviewed and negative.    PHYSICAL EXAM: VS:  BP 110/78   Pulse 86   Ht 5' 4"  (1.626 m)   Wt 211 lb (95.7 kg)   SpO2 97%   BMI 36.22 kg/m  , BMI Body mass index is 36.22 kg/m. GEN: Well nourished, well developed, in no acute distress  HEENT: normal  Neck: no JVD, carotid bruits, or masses Cardiac: RRR; 3/6 harsh systolic murmur, no rubs, or gallops,no edema  Respiratory:  clear to auscultation bilaterally, normal work of breathing GI: soft, nontender, nondistended, + BS MS: no deformity or atrophy  Skin: warm and dry, no rash Neuro:  Strength and sensation are intact Psych: euthymic mood, full affect   EKG:   The ekg ordered today demonstrates NSR, no ST changes   Recent Labs: 10/23/2016: ALT 19; BUN 13; Creatinine, Ser 0.78; Hemoglobin 12.6; Platelets 74; Potassium 4.8; Sodium 137   Lipid Panel No results found for: CHOL, TRIG, HDL, CHOLHDL, VLDL, LDLCALC, LDLDIRECT   Other studies Reviewed: Additional studies/ records that were reviewed today with results demonstrating: prior echo reviewed.   ASSESSMENT AND PLAN:  1. Aortic valve disease: Severe AS.  One episode of lightheadedness.  Could be related to ALS.  It is not occurring with exertion.  Will recheck echocardiogram.  Will plan for referral to surgeon after echocardiogram.  I did explain to her that she would need a heart catheterization prior to any aortic valve intervention. 2. Bilateral leg edema: Resolved at this time.   3. SHOB: Improved.  One episode of lightheadedness as above. 4. Diabetes: Last A1c, 5.5 in March 2019.  Resolved after weight loss.     Current medicines are reviewed at length with the patient today.  The patient concerns regarding her medicines were addressed.  The following changes have been made:  No change  Labs/ tests ordered today include:   Orders Placed This Encounter  Procedures  . EKG 12-Lead  . ECHOCARDIOGRAM COMPLETE    Recommend 150 minutes/week of aerobic exercise Low fat, low carb, high fiber diet recommended  Disposition:   FU in 1 year   Signed, Whitney Grooms, MD  08/01/2017 10:29 AM    [facility] [place], [place], [place]  [address] Phone: [phone]; Fax: [phone]

## 2017-07-31 NOTE — Progress Notes (Signed)
Cardiology Office Note   Date:  08/01/2017   ID:  [name], Whitney 08-30-1955, MRN [id number]  PCP:  Hulan Fess, MD    No chief complaint on file.  Aortic stenosis  Wt Readings from Last 3 Encounters:  08/01/17 211 lb (95.7 kg)  10/22/16 205 lb (93 kg)  09/28/16 205 lb 7.5 oz (93.2 kg)       History of Present Illness: Whitney [name] is a 62 y.o. female  who has had aortic stenosis. SHe has gained weight since weight loss surgery.  She has NASH and is treated at [facility]. Albumin was low on recent check at 3.2. Other LFTshave been slightly elevated.  This has progressed to cirrhosis but she has not had sx of liver disease.   She had gastric sleeve surgery in 2015 and has lost a lot of weight.  She lost 100 lbs with the surgery.   Her son also had fatty liver and subsequent weight loss surgery. Her son has severe depression and PTSD.   She retired in 2016, and gained some weight.  Swelling has not been an issue of late.  In 2018, she had hernia repair. She has had aortic stenosis which has progressed to severe range as of March 2017. She has been asymptomatic from a cardiac standpoint. She denied any exertional chest discomfort. She had not had any signs of heart failure including orthopnea, fluid retention. She denied any lightheadedness or syncope.  Prior evaluations for cardiac ischemia were negative in the past. She tolerated the hernia surgery well. No issues.    Since the last visit, she has had some left side chest pain.  It is better with pushing on it.  In the last 6 months, her fibromyalgia has gotten worse.  She fels that the chest pain is related to her fibromyalgia.  She had one episode of lightheadedness while eating.  It resolved quickly.  She is walking in th ehouse.  She is unpacking her house.  She is starting Silver sneakers.    Denies : Exrtional chest pain, Leg edema. Nitroglycerin use. Orthopnea. Palpitations. Paroxysmal nocturnal dyspnea.  Shortness of breath. Syncope.       Past Medical History:  Diagnosis Date  . Allergic rhinitis   . Anemia    hx  . Arthritis   . Asthma    hx yrs ago  . Cirrhosis ([place]) last albumin 3.3 done at [facility] 06-16-2014 (under care everywhere tab in epic)   Secondary to Fatty liver --  followed by hepatology at [facility] (dr Gerald Dexter)   . Depression   . Diabetes mellitus type II   . Fibromyalgia   . GERD (gastroesophageal reflux disease)   . Heart murmur    asymptomatic ---  1989 from rhuematic fever  . History of exercise stress test    05-05-2013---  negative bruce ETT given exercise workload,  no ischemia  . History of hiatal hernia   . History of kidney stones   . History of rheumatic fever    1989  . Hyperlipidemia   . Leukocytopenia   . Moderate aortic stenosis    AVA area 1.1cm2---  cardiologist --  dr Concepcion Living, 2014 in epic  . NASH (nonalcoholic steatohepatitis)   . OSA (obstructive sleep apnea)    was using CPAP before gastric sleeve 2015--  no uses after wt loss  . Pneumonia    hx  . Sjogren's syndrome ([place])   . Thrombocytopenia ([place])  Past Surgical History:  Procedure Laterality Date  . CYSTOSCOPY WITH RETROGRADE PYELOGRAM, URETEROSCOPY AND STENT PLACEMENT Left 01/21/2015   Procedure: CYSTOSCOPY WITH LEFT  RETROGRADE PYELOGRAM, LEFT URETEROSCOPY AND STENT PLACEMENT;  Surgeon: Festus Aloe, MD;  Location: [place];  Service: Urology;  Laterality: Left;  . CYSTOSCOPY WITH RETROGRADE PYELOGRAM, URETEROSCOPY AND STENT PLACEMENT Bilateral 02/24/2015   Procedure: CYSTOSCOPY WITH RIGHT RETROGRADE PYELOGRAM, BLADDER BIOPSY FULGERATION LEFT URETEROSCOPY AND STENT REPLACEMENT;  Surgeon: Festus Aloe, MD;  Location: [facility];  Service: Urology;  Laterality: Bilateral;  . EXPLORATORY LAPAROSCOPY W/  CONE BIOPSY'S LEFT AND RIGHT LOBE OF LIVER  11-04-2007  . HOLMIUM LASER APPLICATION Left 25/42/7062   Procedure: HOLMIUM LASER LITHOTRIPSY;   Surgeon: Festus Aloe, MD;  Location: [facility];  Service: Urology;  Laterality: Left;  . HYSTEROSCOPY W/D&C N/A 12/11/2012   Procedure: DILATATION AND CURETTAGE /HYSTEROSCOPY;  Surgeon: Maeola Sarah. Landry Mellow, MD;  Location: [facility];  Service: Gynecology;  Laterality: N/A;  . INGUINAL HERNIA REPAIR Left 10/22/2016   Procedure: LEFT INGUINAL HERNIA REPAIR;  Surgeon: Rolm Bookbinder, MD;  Location: [place];  Service: General;  Laterality: Left;  TAP BLOCK  . INSERTION OF MESH Left 10/22/2016   Procedure: INSERTION OF MESH;  Surgeon: Rolm Bookbinder, MD;  Location: [place];  Service: General;  Laterality: Left;  . LAPAROSCOPIC GASTRIC SLEEVE RESECTION  07-27-2013  . PUBOVAGINAL SLING  04-10-2001   [place]  . TONSILLECTOMY  1975  . TRANSTHORACIC ECHOCARDIOGRAM  06-04-2012  dr Irish Lack   mild LVH,  grade I diastolic dysfunction/  ef 60-65%/  moderate LAE/  mild MV calcifation without stenosis,  mild MR/  moderate AV stenosis,  cannot r/o bicupsid, area 1.1cm2/  mild dilated aortic root/  trivial TR     Current Outpatient Medications  Medication Sig Dispense Refill  . Biotin 10000 MCG TABS Take 10,000 Units by mouth daily.    . Cholecalciferol (VITAMIN D) 2000 UNITS CAPS Take 2,000 Units by mouth daily.    . Cyanocobalamin (VITAMIN B-12) 5000 MCG SUBL Place 5,000 mcg under the tongue daily.    Marland Kitchen ezetimibe (ZETIA) 10 MG tablet Take 10 mg by mouth every evening.   11  . Multiple Vitamin (MULTI VITAMIN) TABS Take 3 tablets by mouth 2 (two) times daily. BARIATRIC MULTI VIT     No current facility-administered medications for this visit.     Allergies:   Doxycycline and Naproxen    Social History:  The patient  reports that she has never smoked. She has never used smokeless tobacco. She reports that she drinks alcohol. She reports that she does not use drugs.   Family History:  The patient's family history includes Allergies in her daughter; Alzheimer's disease in her father; Asthma in  her daughter, father, and paternal grandmother; Heart attack in her father; Heart disease in her father and mother; Hip fracture in her father; Hypertension in her father; Rheum arthritis in her mother.    ROS:  Please see the history of present illness.   Otherwise, review of systems are positive for one episode of lightheadedness.   All other systems are reviewed and negative.    PHYSICAL EXAM: VS:  BP 110/78   Pulse 86   Ht 5' 4"  (1.626 m)   Wt 211 lb (95.7 kg)   SpO2 97%   BMI 36.22 kg/m  , BMI Body mass index is 36.22 kg/m. GEN: Well nourished, well developed, in no acute distress  HEENT: normal  Neck: no JVD, carotid bruits, or masses Cardiac: RRR; 3/6 harsh systolic murmur, no rubs, or gallops,no edema  Respiratory:  clear to auscultation bilaterally, normal work of breathing GI: soft, nontender, nondistended, + BS MS: no deformity or atrophy  Skin: warm and dry, no rash Neuro:  Strength and sensation are intact Psych: euthymic mood, full affect   EKG:   The ekg ordered today demonstrates NSR, no ST changes   Recent Labs: 10/23/2016: ALT 19; BUN 13; Creatinine, Ser 0.78; Hemoglobin 12.6; Platelets 74; Potassium 4.8; Sodium 137   Lipid Panel No results found for: CHOL, TRIG, HDL, CHOLHDL, VLDL, LDLCALC, LDLDIRECT   Other studies Reviewed: Additional studies/ records that were reviewed today with results demonstrating: prior echo reviewed.   ASSESSMENT AND PLAN:  1. Aortic valve disease: Severe AS.  One episode of lightheadedness.  Could be related to ALS.  It is not occurring with exertion.  Will recheck echocardiogram.  Will plan for referral to surgeon after echocardiogram.  I did explain to her that she would need a heart catheterization prior to any aortic valve intervention. 2. Bilateral leg edema: Resolved at this time.   3. SHOB: Improved.  One episode of lightheadedness as above. 4. Diabetes: Last A1c, 5.5 in March 2019.  Resolved after weight loss.     Current medicines are reviewed at length with the patient today.  The patient concerns regarding her medicines were addressed.  The following changes have been made:  No change  Labs/ tests ordered today include:   Orders Placed This Encounter  Procedures  . EKG 12-Lead  . ECHOCARDIOGRAM COMPLETE    Recommend 150 minutes/week of aerobic exercise Low fat, low carb, high fiber diet recommended  Disposition:   FU in 1 year   Signed, Larae Grooms, MD  08/01/2017 10:29 AM    [facility] [place], [place], [place]  [address] Phone: [phone]; Fax: [phone]

## 2017-08-01 ENCOUNTER — Encounter: Payer: Self-pay | Admitting: Interventional Cardiology

## 2017-08-01 ENCOUNTER — Ambulatory Visit: Payer: Medicare Other | Admitting: Interventional Cardiology

## 2017-08-01 VITALS — BP 110/78 | HR 86 | Ht 64.0 in | Wt 211.0 lb

## 2017-08-01 DIAGNOSIS — E119 Type 2 diabetes mellitus without complications: Secondary | ICD-10-CM

## 2017-08-01 DIAGNOSIS — R42 Dizziness and giddiness: Secondary | ICD-10-CM

## 2017-08-01 DIAGNOSIS — I359 Nonrheumatic aortic valve disorder, unspecified: Secondary | ICD-10-CM

## 2017-08-01 NOTE — Patient Instructions (Signed)
Medication Instructions:  Your physician recommends that you continue on your current medications as directed. Please refer to the Current Medication list given to you today.   Labwork: None ordered  Testing/Procedures: Your physician has requested that you have an echocardiogram. Echocardiography is a painless test that uses sound waves to create images of your heart. It provides your doctor with information about the size and shape of your heart and how well your heart's chambers and valves are working. This procedure takes approximately one hour. There are no restrictions for this procedure.  Follow-Up: Your physician wants you to follow-up in: 1 year with Dr. Irish Lack. You will receive a reminder letter in the mail two months in advance. If you don't receive a letter, please call our office to schedule the follow-up appointment.   Any Other Special Instructions Will Be Listed Below (If Applicable).     If you need a refill on your cardiac medications before your next appointment, please call your pharmacy.

## 2017-08-08 ENCOUNTER — Ambulatory Visit (HOSPITAL_COMMUNITY): Payer: Medicare Other | Attending: Interventional Cardiology

## 2017-08-08 DIAGNOSIS — I359 Nonrheumatic aortic valve disorder, unspecified: Secondary | ICD-10-CM | POA: Diagnosis not present

## 2017-08-08 DIAGNOSIS — I083 Combined rheumatic disorders of mitral, aortic and tricuspid valves: Secondary | ICD-10-CM | POA: Insufficient documentation

## 2017-08-13 ENCOUNTER — Other Ambulatory Visit: Payer: Self-pay

## 2017-08-13 ENCOUNTER — Telehealth: Payer: Self-pay | Admitting: Interventional Cardiology

## 2017-08-13 DIAGNOSIS — I359 Nonrheumatic aortic valve disorder, unspecified: Secondary | ICD-10-CM

## 2017-08-13 NOTE — Telephone Encounter (Signed)
Close encounter 

## 2017-08-14 NOTE — Telephone Encounter (Signed)
  Notes recorded by Drue Novel I, RN on 08/14/2017 at 9:42 AM EDT Called and made patient aware that she will need to have her heart cath done prior to her appointment with Dr. Cyndia Bent on 4/30. Made patient aware that I would call her later today to go over all of her instructions. Patient verbalized understanding and thanked me for the call. ------  Notes recorded by Cleon Gustin, RN on 08/13/2017 at 8:55 AM EDT Called patient and made her aware of her echo results. Made patient aware that we will put in a referral to [facility] to discuss having an aortic valve replacement. Made patient aware that she will likely need a heart cath. Patient wanting to know if she has to have this done prior to seeing the surgeon. Made patient aware that they usually like to have this information as well, but I would forward to Dr. Irish Lack for review. Patient verbalized understanding. Referral to [facility] placed.  Burnell Blanks, MD  [name], MD; Drue Novel I, RN Cc: Sherren Mocha, MD        Ulice Dash, Given her age, you probably should go ahead and cath her and then send her to see one of the surgeons. If they think she would be better served with TAVR given her comorbidities, they will plug her into the TAVR workup.   Gerald Stabs   Previous Messages    ----- Message -----  From: [name], MD  Sent: 08/12/2017  2:41 PM  To: Sherren Mocha, MD, Burnell Blanks, MD, *   Normal LV function. Severe AS. She needs referral to surgery, either Dr. Roxy Manns or Cyndia Bent to discuss AVR.Marland Kitchenor Dr. Burt Knack or [place].   Should one of you see this patient before a surgeon?   JV  ----- Message -----  From: Burnell Blanks, MD  Sent: 08/09/2017  7:11 PM  To: [name], MD, Sherren Mocha, MD, Ulice Dash, That should not be a limitation to TAVR. Gerald Stabs   ----- Message -----  From: [name], MD  Sent: 08/09/2017  1:34 PM  To: Sherren Mocha, MD, Burnell Blanks, MD, *   Normal LV function,. Severe AS.   Ronalee Belts and [place], Given moderate AI, is she still a candidate for TAVR? She has cirrhosis from NASH.

## 2017-08-14 NOTE — Telephone Encounter (Signed)
Left message for patient to call back  

## 2017-08-14 NOTE — Addendum Note (Signed)
Addended by: Drue Novel I on: 08/14/2017 12:07 PM   Modules accepted: Orders

## 2017-08-14 NOTE — Telephone Encounter (Addendum)
Called and made patient aware that her cath has been scheduled for 08/23/17 at 12:00 PM. Patient aware that she will need to be there at 10:00 AM. Lab appointment made for 08/20/17. Pre-cath instructions below reviewed with the patient. Letter left up front for patient to pick up when she comes in for her lab appointment. Patient verbalized understanding and thanked me for the call.     [facility] [address] [place] [address] [place] [address] Dept: [phone] Loc: [place]  08/14/2017  You are scheduled for a Right and Left Cardiac Catheterization on Friday, April 19 with Dr. Larae Grooms.  1. Please arrive at the [facility] (Main Entrance A) at [facility]: [place], [place] [address] at 10:00 AM (two hours before your procedure to ensure your preparation). Free valet parking service is available.   Special note: Every effort is made to have your procedure done on time. Please understand that emergencies sometimes delay scheduled procedures.  2. Diet: Do not eat or drink anything after midnight prior to your procedure except sips of water to take medications.  3. Labs: 08/20/17: CBC, BMET, PT/INR  4. Medication instructions in preparation for your procedure:    On the morning of your procedure, be sure to take a baby Aspirin 81 mg and any of your regular morning medicines.  You may use sips of water.  5. Plan for one night stay--bring personal belongings. 6. Bring a current list of your medications and current insurance cards. 7. You MUST have a responsible person to drive you home. 8. Someone MUST be with you the first 24 hours after you arrive home or your discharge will be delayed. 9. Please wear clothes that are easy to get on and off and wear slip-on shoes.  Thank you for allowing [place] to care for you!   -- [facility]

## 2017-08-20 ENCOUNTER — Other Ambulatory Visit: Payer: Medicare Other | Admitting: *Deleted

## 2017-08-20 DIAGNOSIS — I359 Nonrheumatic aortic valve disorder, unspecified: Secondary | ICD-10-CM

## 2017-08-21 LAB — CBC
Hematocrit: 40.9 % (ref 34.0–46.6)
Hemoglobin: 13.9 g/dL (ref 11.1–15.9)
MCH: 29.7 pg (ref 26.6–33.0)
MCHC: 34 g/dL (ref 31.5–35.7)
MCV: 87 fL (ref 79–97)
PLATELETS: 93 10*3/uL — AB (ref 150–379)
RBC: 4.68 x10E6/uL (ref 3.77–5.28)
RDW: 13.9 % (ref 12.3–15.4)
WBC: 3.2 10*3/uL — AB (ref 3.4–10.8)

## 2017-08-21 LAB — BASIC METABOLIC PANEL
BUN / CREAT RATIO: 15 (ref 12–28)
BUN: 11 mg/dL (ref 8–27)
CHLORIDE: 101 mmol/L (ref 96–106)
CO2: 25 mmol/L (ref 20–29)
CREATININE: 0.74 mg/dL (ref 0.57–1.00)
Calcium: 9 mg/dL (ref 8.7–10.3)
GFR calc Af Amer: 99 mL/min/{1.73_m2} (ref >59)
GFR calc non Af Amer: 86 mL/min/{1.73_m2} (ref >59)
GLUCOSE: 91 mg/dL (ref 65–99)
Potassium: 4.1 mmol/L (ref 3.5–5.2)
SODIUM: 139 mmol/L (ref 134–144)

## 2017-08-21 LAB — PROTIME-INR
INR: 1.2 (ref 0.8–1.2)
PROTHROMBIN TIME: 11.9 s (ref 9.1–12.0)

## 2017-08-21 NOTE — Telephone Encounter (Signed)
This encounter was created in error - please disregard.

## 2017-08-22 ENCOUNTER — Telehealth: Payer: Self-pay | Admitting: *Deleted

## 2017-08-22 NOTE — Telephone Encounter (Signed)
Pt contacted pre-catheterization scheduled at [facility] for: Friday August 23, 2017 12 noon Verified arrival time and place: [place] at: 10 AM Nothing to eat or drink after midnight prior to cath Verified allergies in Epic. Verified no diabetes medications  AM meds can be  taken pre-cath with sip of water including: ASA 81 mg  Confirmed patient has responsible person to drive home post procedure and observe patient for 24 hours: yes

## 2017-08-23 ENCOUNTER — Ambulatory Visit (HOSPITAL_COMMUNITY)
Admission: RE | Admit: 2017-08-23 | Discharge: 2017-08-23 | Disposition: A | Discharge status: Home / Self Care | Payer: Medicare Other | Source: Ambulatory Visit | Attending: Interventional Cardiology | Admitting: Interventional Cardiology

## 2017-08-23 ENCOUNTER — Encounter (HOSPITAL_COMMUNITY)
Admission: RE | Disposition: A | Discharge status: Home / Self Care | Payer: Self-pay | Source: Ambulatory Visit | Attending: Interventional Cardiology

## 2017-08-23 DIAGNOSIS — Z8249 Family history of ischemic heart disease and other diseases of the circulatory system: Secondary | ICD-10-CM | POA: Insufficient documentation

## 2017-08-23 DIAGNOSIS — M797 Fibromyalgia: Secondary | ICD-10-CM | POA: Diagnosis not present

## 2017-08-23 DIAGNOSIS — I350 Nonrheumatic aortic (valve) stenosis: Secondary | ICD-10-CM | POA: Diagnosis not present

## 2017-08-23 DIAGNOSIS — Z886 Allergy status to analgesic agent status: Secondary | ICD-10-CM | POA: Insufficient documentation

## 2017-08-23 DIAGNOSIS — E785 Hyperlipidemia, unspecified: Secondary | ICD-10-CM | POA: Diagnosis not present

## 2017-08-23 DIAGNOSIS — R079 Chest pain, unspecified: Secondary | ICD-10-CM | POA: Diagnosis not present

## 2017-08-23 DIAGNOSIS — M3500 Sicca syndrome, unspecified: Secondary | ICD-10-CM | POA: Insufficient documentation

## 2017-08-23 DIAGNOSIS — K7460 Unspecified cirrhosis of liver: Secondary | ICD-10-CM | POA: Diagnosis not present

## 2017-08-23 DIAGNOSIS — Z9884 Bariatric surgery status: Secondary | ICD-10-CM | POA: Insufficient documentation

## 2017-08-23 DIAGNOSIS — Z881 Allergy status to other antibiotic agents status: Secondary | ICD-10-CM | POA: Diagnosis not present

## 2017-08-23 DIAGNOSIS — G4733 Obstructive sleep apnea (adult) (pediatric): Secondary | ICD-10-CM | POA: Diagnosis not present

## 2017-08-23 DIAGNOSIS — Z79899 Other long term (current) drug therapy: Secondary | ICD-10-CM | POA: Insufficient documentation

## 2017-08-23 DIAGNOSIS — K7581 Nonalcoholic steatohepatitis (NASH): Secondary | ICD-10-CM | POA: Insufficient documentation

## 2017-08-23 DIAGNOSIS — Z960 Presence of urogenital implants: Secondary | ICD-10-CM | POA: Insufficient documentation

## 2017-08-23 DIAGNOSIS — M1990 Unspecified osteoarthritis, unspecified site: Secondary | ICD-10-CM | POA: Insufficient documentation

## 2017-08-23 DIAGNOSIS — I2584 Coronary atherosclerosis due to calcified coronary lesion: Secondary | ICD-10-CM | POA: Diagnosis not present

## 2017-08-23 DIAGNOSIS — K219 Gastro-esophageal reflux disease without esophagitis: Secondary | ICD-10-CM | POA: Diagnosis not present

## 2017-08-23 HISTORY — PX: RIGHT/LEFT HEART CATH AND CORONARY ANGIOGRAPHY: CATH118266

## 2017-08-23 LAB — POCT I-STAT 3, VENOUS BLOOD GAS (G3P V)
Acid-base deficit: 2 mmol/L (ref 0.0–2.0)
BICARBONATE: 24.1 mmol/L (ref 20.0–28.0)
O2 SAT: 79 %
TCO2: 25 mmol/L (ref 22–32)
pCO2, Ven: 43.3 mmHg — ABNORMAL LOW (ref 44.0–60.0)
pH, Ven: 7.353 (ref 7.250–7.430)
pO2, Ven: 46 mmHg — ABNORMAL HIGH (ref 32.0–45.0)

## 2017-08-23 LAB — POCT I-STAT 3, ART BLOOD GAS (G3+)
Acid-base deficit: 2 mmol/L (ref 0.0–2.0)
Bicarbonate: 23.4 mmol/L (ref 20.0–28.0)
O2 SAT: 99 %
PCO2 ART: 39.2 mmHg (ref 32.0–48.0)
PO2 ART: 135 mmHg — AB (ref 83.0–108.0)
TCO2: 25 mmol/L (ref 22–32)
pH, Arterial: 7.383 (ref 7.350–7.450)

## 2017-08-23 LAB — GLUCOSE, CAPILLARY: Glucose-Capillary: 83 mg/dL (ref 65–99)

## 2017-08-23 SURGERY — RIGHT/LEFT HEART CATH AND CORONARY ANGIOGRAPHY
Anesthesia: LOCAL

## 2017-08-23 MED ORDER — SODIUM CHLORIDE 0.9 % IV SOLN: 250.0000 mL | INTRAVENOUS | Status: DC | PRN

## 2017-08-23 MED ORDER — HEPARIN (PORCINE) IN NACL 2-0.9 UNITS/ML
INTRAMUSCULAR | Status: DC | PRN
  Administered 2017-08-23 (×2): 500 mL

## 2017-08-23 MED ORDER — LIDOCAINE HCL (PF) 1 % IJ SOLN
INTRAMUSCULAR | Status: DC | PRN
  Administered 2017-08-23: 2 mL

## 2017-08-23 MED ORDER — VERAPAMIL HCL 2.5 MG/ML IV SOLN
INTRAVENOUS | Status: DC | PRN
  Administered 2017-08-23: 10 mL via INTRA_ARTERIAL

## 2017-08-23 MED ORDER — MIDAZOLAM HCL 2 MG/2ML IJ SOLN
INTRAMUSCULAR | Status: AC
  Filled 2017-08-23: qty 2

## 2017-08-23 MED ORDER — LIDOCAINE HCL (PF) 1 % IJ SOLN
INTRAMUSCULAR | Status: AC
  Filled 2017-08-23: qty 30

## 2017-08-23 MED ORDER — SODIUM CHLORIDE 0.9% FLUSH: 3.0000 mL | INTRAVENOUS | Status: DC | PRN

## 2017-08-23 MED ORDER — ONDANSETRON HCL 4 MG/2ML IJ SOLN: 4.0000 mg | Freq: Four times a day (QID) | INTRAMUSCULAR | Status: DC | PRN

## 2017-08-23 MED ORDER — MIDAZOLAM HCL 2 MG/2ML IJ SOLN
INTRAMUSCULAR | Status: DC | PRN
  Administered 2017-08-23: 1 mg via INTRAVENOUS
  Administered 2017-08-23: 2 mg via INTRAVENOUS
  Administered 2017-08-23: 1 mg via INTRAVENOUS

## 2017-08-23 MED ORDER — SODIUM CHLORIDE 0.9% FLUSH: 3.0000 mL | Freq: Two times a day (BID) | INTRAVENOUS | Status: DC

## 2017-08-23 MED ORDER — HEPARIN SODIUM (PORCINE) 1000 UNIT/ML IJ SOLN
INTRAMUSCULAR | Status: DC | PRN
  Administered 2017-08-23: 5000 [IU] via INTRAVENOUS

## 2017-08-23 MED ORDER — IOHEXOL 350 MG/ML SOLN
INTRAVENOUS | Status: DC | PRN
  Administered 2017-08-23: 60 mL via INTRA_ARTERIAL

## 2017-08-23 MED ORDER — FENTANYL CITRATE (PF) 100 MCG/2ML IJ SOLN
INTRAMUSCULAR | Status: AC
  Filled 2017-08-23: qty 2

## 2017-08-23 MED ORDER — FENTANYL CITRATE (PF) 100 MCG/2ML IJ SOLN
INTRAMUSCULAR | Status: DC | PRN
  Administered 2017-08-23 (×3): 25 ug via INTRAVENOUS

## 2017-08-23 MED ORDER — SODIUM CHLORIDE 0.9 % WEIGHT BASED INFUSION
3.0000 mL/kg/h | INTRAVENOUS | Status: DC
  Administered 2017-08-23: 3 mL/kg/h via INTRAVENOUS

## 2017-08-23 MED ORDER — VERAPAMIL HCL 2.5 MG/ML IV SOLN
INTRAVENOUS | Status: AC
  Filled 2017-08-23: qty 2

## 2017-08-23 MED ORDER — HEPARIN SODIUM (PORCINE) 1000 UNIT/ML IJ SOLN
INTRAMUSCULAR | Status: AC
  Filled 2017-08-23: qty 1

## 2017-08-23 MED ORDER — ASPIRIN 81 MG PO CHEW: 81.0000 mg | CHEWABLE_TABLET | ORAL | Status: DC

## 2017-08-23 MED ORDER — ACETAMINOPHEN 325 MG PO TABS: 650.0000 mg | ORAL_TABLET | ORAL | Status: DC | PRN

## 2017-08-23 MED ORDER — HEPARIN (PORCINE) IN NACL 1000-0.9 UT/500ML-% IV SOLN
INTRAVENOUS | Status: AC
  Filled 2017-08-23: qty 1000

## 2017-08-23 MED ORDER — SODIUM CHLORIDE 0.9 % IV SOLN: INTRAVENOUS | Status: DC

## 2017-08-23 MED ORDER — SODIUM CHLORIDE 0.9 % WEIGHT BASED INFUSION: 1.0000 mL/kg/h | INTRAVENOUS | Status: DC

## 2017-08-23 SURGICAL SUPPLY — 22 items
CATH BALLN WEDGE 5F 110CM (CATHETERS) ×2 IMPLANT
CATH INFINITI 4FR 145 PIGTAIL (CATHETERS) ×2 IMPLANT
CATH INFINITI 5 FR JL3.5 (CATHETERS) ×2 IMPLANT
CATH INFINITI 5FR AL1 (CATHETERS) ×2 IMPLANT
CATH INFINITI JR4 5F (CATHETERS) ×2 IMPLANT
COVER PRB 48X5XTLSCP FOLD TPE (BAG) ×1 IMPLANT
COVER PROBE 5X48 (BAG) ×2
DEVICE RAD COMP TR BAND LRG (VASCULAR PRODUCTS) ×1 IMPLANT
GUIDEWIRE INQWIRE 1.5J.035X260 (WIRE) ×1 IMPLANT
INQWIRE 1.5J .035X260CM (WIRE) ×2
KIT HEART LEFT (KITS) ×2 IMPLANT
NDL PERC 21GX4CM (NEEDLE) IMPLANT
NEEDLE PERC 21GX4CM (NEEDLE) ×2 IMPLANT
PACK CARDIAC CATHETERIZATION (CUSTOM PROCEDURE TRAY) ×2 IMPLANT
SHEATH RAIN 4/5FR (SHEATH) ×2 IMPLANT
SHEATH RAIN RADIAL 21G 6FR (SHEATH) ×1 IMPLANT
SYR MEDRAD MARK V 150ML (SYRINGE) ×2 IMPLANT
TRANSDUCER W/STOPCOCK (MISCELLANEOUS) ×4 IMPLANT
TUBING ART PRESS 72  MALE/FEM (TUBING) ×1
TUBING ART PRESS 72 MALE/FEM (TUBING) ×1 IMPLANT
TUBING CIL FLEX 10 FLL-RA (TUBING) ×2 IMPLANT
WIRE EMERALD ST .035X150CM (WIRE) ×2 IMPLANT

## 2017-08-23 NOTE — Interval H&P Note (Signed)
Cath Lab Visit (complete for each Cath Lab visit)  Clinical Evaluation Leading to the Procedure:   ACS: No.  Non-ACS:    Anginal Classification: CCS II  Anti-ischemic medical therapy: Minimal Therapy (1 class of medications)  Non-Invasive Test Results: No non-invasive testing performed  Prior CABG: No previous CABG  Severe AS by echo.    History and Physical Interval Note:  08/23/2017 12:48 PM  Whitney [name]  has presented today for surgery, with the diagnosis of as  The various methods of treatment have been discussed with the patient and family. After consideration of risks, benefits and other options for treatment, the patient has consented to  Procedure(s): RIGHT/LEFT HEART CATH AND CORONARY ANGIOGRAPHY (N/A) as a surgical intervention .  The patient's history has been reviewed, patient examined, no change in status, stable for surgery.  I have reviewed the patient's chart and labs.  Questions were answered to the patient's satisfaction.     Larae Grooms

## 2017-08-23 NOTE — Discharge Instructions (Signed)
**Note Destani Wamser-identified via Obfuscation** Radial Site Care °Refer to this sheet in the next few weeks. These instructions provide you with information about caring for yourself after your procedure. Your health care provider may also give you more specific instructions. Your treatment has been planned according to current medical practices, but problems sometimes occur. Call your health care provider if you have any problems or questions after your procedure. °What can I expect after the procedure? °After your procedure, it is typical to have the following: °· Bruising at the radial site that usually fades within 1-2 weeks. °· Blood collecting in the tissue (hematoma) that may be painful to the touch. It should usually decrease in size and tenderness within 1-2 weeks. ° °Follow these instructions at home: °· Take medicines only as directed by your health care provider. °· You may shower 24-48 hours after the procedure or as directed by your health care provider. Remove the bandage (dressing) and gently wash the site with plain soap and water. Pat the area dry with a clean towel. Do not rub the site, because this may cause bleeding. °· Do not take baths, swim, or use a hot tub until your health care provider approves. °· Check your insertion site every day for redness, swelling, or drainage. °· Do not apply powder or lotion to the site. °· Do not flex or bend the affected arm for 24 hours or as directed by your health care provider. °· Do not push or pull heavy objects with the affected arm for 24 hours or as directed by your health care provider. °· Do not lift over 10 lb (4.5 kg) for 5 days after your procedure or as directed by your health care provider. °· Ask your health care provider when it is okay to: °? Return to work or school. °? Resume usual physical activities or sports. °? Resume sexual activity. °· Do not drive home if you are discharged the same day as the procedure. Have someone else drive you. °· You may drive 24 hours after the procedure  unless otherwise instructed by your health care provider. °· Do not operate machinery or power tools for 24 hours after the procedure. °· If your procedure was done as an outpatient procedure, which means that you went home the same day as your procedure, a responsible adult should be with you for the first 24 hours after you arrive home. °· Keep all follow-up visits as directed by your health care provider. This is important. °Contact a health care provider if: °· You have a fever. °· You have chills. °· You have increased bleeding from the radial site. Hold pressure on the site. °Get help right away if: °· You have unusual pain at the radial site. °· You have redness, warmth, or swelling at the radial site. °· You have drainage (other than a small amount of blood on the dressing) from the radial site. °· The radial site is bleeding, and the bleeding does not stop after 30 minutes of holding steady pressure on the site. °· Your arm or hand becomes pale, cool, tingly, or numb. °This information is not intended to replace advice given to you by your health care provider. Make sure you discuss any questions you have with your health care provider. °Document Released: 05/26/2010 Document Revised: 09/29/2015 Document Reviewed: 11/09/2013 °[employer] Interactive Patient Education © 2018 [employer] ° °

## 2017-08-26 ENCOUNTER — Encounter (HOSPITAL_COMMUNITY): Payer: Self-pay | Admitting: Interventional Cardiology

## 2017-08-26 MED FILL — Heparin Sod (Porcine)-NaCl IV Soln 1000 Unit/500ML-0.9%: INTRAVENOUS | Qty: 1000 | Status: AC

## 2017-09-03 ENCOUNTER — Institutional Professional Consult (permissible substitution): Payer: Medicare Other | Admitting: Surgery

## 2017-09-03 ENCOUNTER — Encounter: Payer: Self-pay | Admitting: Surgery

## 2017-09-03 ENCOUNTER — Other Ambulatory Visit: Payer: Self-pay

## 2017-09-03 VITALS — BP 115/79 | HR 97 | Resp 16 | Ht 64.0 in | Wt 211.0 lb

## 2017-09-03 DIAGNOSIS — I350 Nonrheumatic aortic (valve) stenosis: Secondary | ICD-10-CM

## 2017-09-03 NOTE — Progress Notes (Signed)
Cardiothoracic Surgery Consultation  PCP is Hulan Fess, MD Referring Provider is [name], MD  Chief Complaint  Patient presents with  . Aortic Stenosis    Surgical eval, ECHO 08/08/17, Cardiac Cath 08/23/17    HPI:  The patient is a 62 year old woman with a history of diabetes, hyperlipidemia, Sjogren's syndrome, thrombocytopenia, obstructive sleep apnea fibromyalgia, NASH followed at [facility] (Dr. Gerald Dexter), rheumatic fever and known severe aortic stenosis, and morbid obesity with significant weight loss after gastric sleeve surgery.  She had an echocardiogram in March 2017 which showed a mean gradient across aortic valve of 43 mmHg.  Her most recent echocardiogram on 08/08/2017 shows a trileaflet aortic valve with severe thickening and calcification of the leaflets with a mean gradient of 41 mmHg and a peak gradient of 80 mmHg.  The DVI was 0.25.  There is moderate regurgitation.  The mitral valve had a calcified annulus with mildly thickened and calcified leaflets with findings of mild mitral stenosis with a mean gradient of 5 mmHg.  Left ventricular systolic function was normal with moderate concentric left ventricular hypertrophy and grade 1 diastolic dysfunction.  The patient says that she feels like she is doing well and not really having any symptoms although she does report that her stamina is not as good as it was a year ago and she can only do activities around the house for about 3 hours before she has done for the day.  She denies any substernal chest discomfort.  She has had occasional episodes of lightheadedness particularly with position changes.  She denies orthopnea and PND.  She has had no peripheral edema.  Her son and daughter with her today and feel like she is having more symptoms and she admits to.  She is a retired Nature conservation officer.  She says that she lost about 100 pounds after her gastric sleeve surgery but gained about 25 pounds since she has  retired. Past Medical History:  Diagnosis Date  . Allergic rhinitis   . Anemia    hx  . Arthritis   . Asthma    hx yrs ago  . Cirrhosis ([place]) last albumin 3.3 done at [facility] 06-16-2014 (under care everywhere tab in epic)   Secondary to Fatty liver --  followed by hepatology at [facility] (dr Gerald Dexter)   . Depression   . Diabetes mellitus type II   . Fibromyalgia   . GERD (gastroesophageal reflux disease)   . Heart murmur    asymptomatic ---  1989 from rhuematic fever  . History of exercise stress test    05-05-2013---  negative bruce ETT given exercise workload,  no ischemia  . History of hiatal hernia   . History of kidney stones   . History of rheumatic fever    1989  . Hyperlipidemia   . Leukocytopenia   . Moderate aortic stenosis    AVA area 1.1cm2---  cardiologist --  dr Concepcion Living, 2014 in epic  . NASH (nonalcoholic steatohepatitis)   . OSA (obstructive sleep apnea)    was using CPAP before gastric sleeve 2015--  no uses after wt loss  . Pneumonia    hx  . Sjogren's syndrome ([place])   . Thrombocytopenia ([place])     Past Surgical History:  Procedure Laterality Date  . CYSTOSCOPY WITH RETROGRADE PYELOGRAM, URETEROSCOPY AND STENT PLACEMENT Left 01/21/2015   Procedure: CYSTOSCOPY WITH LEFT  RETROGRADE PYELOGRAM, LEFT URETEROSCOPY AND STENT PLACEMENT;  Surgeon: Festus Aloe, MD;  Location: [facility];  Service: Urology;  Laterality: Left;  . CYSTOSCOPY WITH RETROGRADE PYELOGRAM, URETEROSCOPY AND STENT PLACEMENT Bilateral 02/24/2015   Procedure: CYSTOSCOPY WITH RIGHT RETROGRADE PYELOGRAM, BLADDER BIOPSY FULGERATION LEFT URETEROSCOPY AND STENT REPLACEMENT;  Surgeon: Festus Aloe, MD;  Location: [facility];  Service: Urology;  Laterality: Bilateral;  . EXPLORATORY LAPAROSCOPY W/  CONE BIOPSY'S LEFT AND RIGHT LOBE OF LIVER  11-04-2007  . HOLMIUM LASER APPLICATION Left 15/40/0867   Procedure: HOLMIUM LASER LITHOTRIPSY;  Surgeon: Festus Aloe, MD;   Location: [facility];  Service: Urology;  Laterality: Left;  . HYSTEROSCOPY W/D&C N/A 12/11/2012   Procedure: DILATATION AND CURETTAGE /HYSTEROSCOPY;  Surgeon: Maeola Sarah. Landry Mellow, MD;  Location: [facility];  Service: Gynecology;  Laterality: N/A;  . INGUINAL HERNIA REPAIR Left 10/22/2016   Procedure: LEFT INGUINAL HERNIA REPAIR;  Surgeon: Rolm Bookbinder, MD;  Location: [facility];  Service: General;  Laterality: Left;  TAP BLOCK  . INSERTION OF MESH Left 10/22/2016   Procedure: INSERTION OF MESH;  Surgeon: Rolm Bookbinder, MD;  Location: [facility];  Service: General;  Laterality: Left;  . LAPAROSCOPIC GASTRIC SLEEVE RESECTION  07-27-2013  . PUBOVAGINAL SLING  04-10-2001   [place]  . RIGHT/LEFT HEART CATH AND CORONARY ANGIOGRAPHY N/A 08/23/2017   Procedure: RIGHT/LEFT HEART CATH AND CORONARY ANGIOGRAPHY;  Surgeon: [name], MD;  Location: [facility];  Service: Cardiovascular;  Laterality: N/A;  . TONSILLECTOMY  1975  . TRANSTHORACIC ECHOCARDIOGRAM  06-04-2012  dr Irish Lack   mild LVH,  grade I diastolic dysfunction/  ef 60-65%/  moderate LAE/  mild MV calcifation without stenosis,  mild MR/  moderate AV stenosis,  cannot r/o bicupsid, area 1.1cm2/  mild dilated aortic root/  trivial TR    Family History  Problem Relation Age of Onset  . Alzheimer's disease Father   . Hip fracture Father   . Asthma Father   . Heart disease Father   . Heart attack Father   . Hypertension Father   . Rheum arthritis Mother   . Heart disease Mother   . Allergies Daughter   . Asthma Paternal Grandmother   . Asthma Daughter   . Stroke Neg Hx     Social History Social History   Tobacco Use  . Smoking status: Never Smoker  . Smokeless tobacco: Never Used  Substance Use Topics  . Alcohol use: Yes    Comment: rarely  . Drug use: No    Current Outpatient Medications  Medication Sig Dispense Refill  . buPROPion (WELLBUTRIN SR) 150 MG 12 hr tablet Take 150 mg by mouth daily.    Marland Kitchen  ezetimibe (ZETIA) 10 MG tablet Take 10 mg by mouth at bedtime.   11  . Multiple Vitamin (MULTI VITAMIN) TABS Take 1 tablet by mouth daily.     Marland Kitchen OVER THE COUNTER MEDICATION Take 1 packet by mouth 2 (two) times daily. Bariatric Multivitamin Powder     No current facility-administered medications for this visit.     Allergies  Allergen Reactions  . Doxycycline Hives and Rash  . Naproxen Rash and Hives    Review of Systems  Constitutional: Positive for activity change and fatigue. Negative for chills, fever and unexpected weight change.  HENT: Negative.        Saw her dentist in 06/2017  Eyes: Negative.   Respiratory:       Some exertional shortness of breath.  Cardiovascular: Negative for chest pain, palpitations and leg swelling.  Gastrointestinal: Negative.  Endocrine: Negative.   Genitourinary: Negative.   Musculoskeletal: Positive for myalgias.  Skin: Negative.   Neurological: Positive for dizziness.  Hematological: Negative.   Psychiatric/Behavioral: Negative.     BP 115/79 (BP Location: Left Arm, Patient Position: Sitting, Cuff Size: Large)   Pulse 97   Resp 16   Ht 5' 4"  (1.626 m)   Wt 211 lb (95.7 kg)   SpO2 97% Comment: RA  BMI 36.22 kg/m  Physical Exam  Constitutional: She is oriented to person, place, and time. She appears well-developed and well-nourished. No distress.  HENT:  Head: Normocephalic and atraumatic.  Mouth/Throat: Oropharynx is clear and moist.  Eyes: Pupils are equal, round, and reactive to light. Conjunctivae and EOM are normal. No scleral icterus.  Neck: Normal range of motion. Neck supple. No JVD present. No thyromegaly present.  Cardiovascular: Normal rate, regular rhythm and intact distal pulses.  Murmur heard. Harsh 3/6 systolic murmur RSB, no diastolic murmur  Pulmonary/Chest: Effort normal and breath sounds normal. No respiratory distress.  Abdominal: Soft. Bowel sounds are normal. She exhibits no distension. There is no tenderness.    Musculoskeletal: She exhibits no edema.  Lymphadenopathy:    She has no cervical adenopathy.  Neurological: She is alert and oriented to person, place, and time. No cranial nerve deficit.  Skin: Skin is warm and dry.  Psychiatric: She has a normal mood and affect.     Diagnostic Tests:    [facility]                        [address],  [address]                            [phone]  ------------------------------------------------------------------- Transthoracic Echocardiography  Patient:    Yeira, Gulden MR #:       [id number] Study Date: 08/08/2017 Gender:     F Age:        28 Height:     162.6 cm Weight:     95.7 kg BSA:        2.12 m^2 Pt. Status: Room:   [place], MD  SONOGRAPHER  Wyatt Mage, RDCS  ORDERING     [place], [place], Outpatient  cc:  -------------------------------------------------------------------  ------------------------------------------------------------------- Indications:      Aortic valve disease (I35.9).  ------------------------------------------------------------------- History:   PMH:   Aortic valve disease.  Risk factors:  OSA. LE edema. Dyslipidemia.  ------------------------------------------------------------------- Study Conclusions  - Left ventricle: The cavity size was normal. There was moderate   concentric hypertrophy. Systolic function was normal. Wall motion   was normal; there were no regional wall motion abnormalities.   Doppler parameters are consistent with abnormal left ventricular   relaxation (grade 1 diastolic dysfunction). Doppler parameters   are consistent with elevated ventricular end-diastolic filling   pressure. - Aortic valve: Trileaflet; severely thickened, severely calcified   leaflets. There was severe stenosis. There was moderate   regurgitation. Mean  gradient (S): 41 mm Hg. Peak gradient (S): 80   mm Hg. - Mitral valve: Calcified annulus. Mildly thickened leaflets . The   findings are consistent with mild stenosis. There was mild   regurgitation.  Mean gradient (D): 5 mm Hg. - Left atrium: The atrium was moderately dilated. - Right atrium: The atrium was mildly dilated. - Tricuspid valve: There was mild regurgitation. - Pulmonary arteries: Systolic pressure was mildly increased. PA   peak pressure: 33 mm Hg (S). - Inferior vena cava: The vessel was normal in size. - Pericardium, extracardiac: There was no pericardial effusion.  Impressions:  - Severe aortic stenosis, moderate aortic regurgitation, mild   mitral stenosis.  ------------------------------------------------------------------- Labs, prior tests, procedures, and surgery: Transthoracic echocardiography (07/07/2015).    The aortic valve showed severe stenosis.  EF was 60%. Aortic valve: peak gradient of 72 mm Hg and mean gradient of 43 mm Hg.  ------------------------------------------------------------------- Study data:  Comparison was made to the study of 07/07/2015.  Study status:  Routine.  Procedure:  The patient reported no pain pre or post test. Transthoracic echocardiography. Image quality was adequate.  Study completion:  There were no complications. Transthoracic echocardiography.  M-mode, complete 2D, spectral Doppler, and color Doppler.  Birthdate:  Patient birthdate: 09/02/1955.  Age:  Patient is 62 yr old.  Sex:  Gender: female. BMI: 36.2 kg/m^2.  Blood pressure:     110/78  Patient status: Outpatient.  Study date:  Study date: 08/08/2017. Study time: 10:33 AM.  Location:  [facility]  -------------------------------------------------------------------  ------------------------------------------------------------------- Left ventricle:  The cavity size was normal. There was moderate concentric hypertrophy. Systolic function was normal.  Wall motion was normal; there were no regional wall motion abnormalities. Doppler parameters are consistent with abnormal left ventricular relaxation (grade 1 diastolic dysfunction). Doppler parameters are consistent with elevated ventricular end-diastolic filling pressure.  ------------------------------------------------------------------- Aortic valve:   Trileaflet; severely thickened, severely calcified leaflets. Mobility was not restricted.  Doppler:   There was severe stenosis.   There was moderate regurgitation.    VTI ratio of LVOT to aortic valve: 0.29. Valve area (VTI): 1.02 cm^2. Indexed valve area (VTI): 0.48 cm^2/m^2. Peak velocity ratio of LVOT to aortic valve: 0.25. Valve area (Vmax): 0.87 cm^2. Indexed valve area (Vmax): 0.41 cm^2/m^2. Mean velocity ratio of LVOT to aortic valve: 0.28. Valve area (Vmean): 0.98 cm^2. Indexed valve area (Vmean): 0.46 cm^2/m^2.    Mean gradient (S): 41 mm Hg. Peak gradient (S): 80 mm Hg.  ------------------------------------------------------------------- Aorta:  Aortic root: The aortic root was normal in size.  ------------------------------------------------------------------- Mitral valve:   Calcified annulus. Mildly thickened leaflets . Mobility was not restricted.  Doppler:   The findings are consistent with mild stenosis.   There was mild regurgitation. Valve area by pressure half-time: 1.95 cm^2. Indexed valve area by pressure half-time: 0.92 cm^2/m^2. Valve area by continuity equation (using LVOT flow): 2.78 cm^2. Indexed valve area by continuity equation (using LVOT flow): 1.31 cm^2/m^2.    Mean gradient (D): 5 mm Hg. Peak gradient (D): 7 mm Hg.  ------------------------------------------------------------------- Left atrium:  The atrium was moderately dilated.  ------------------------------------------------------------------- Right ventricle:  The cavity size was normal. Wall thickness was normal. Systolic function  was normal.  ------------------------------------------------------------------- Pulmonic valve:    Structurally normal valve.   Cusp separation was normal.  Doppler:  Transvalvular velocity was within the normal range. There was no evidence for stenosis. There was no regurgitation.  ------------------------------------------------------------------- Tricuspid valve:   Structurally normal valve.    Doppler: Transvalvular velocity was within the normal range. There was mild regurgitation.  ------------------------------------------------------------------- Pulmonary artery:   The main pulmonary artery was normal-sized. Systolic pressure was mildly increased.  ------------------------------------------------------------------- Right atrium:  The atrium was mildly  dilated.  ------------------------------------------------------------------- Pericardium:  There was no pericardial effusion.  ------------------------------------------------------------------- Systemic veins: Inferior vena cava: The vessel was normal in size.  ------------------------------------------------------------------- Measurements   Left ventricle                            Value          Reference  LV ID, ED, PLAX chordal                   47    mm       43 - 52  LV ID, ES, PLAX chordal                   30    mm       23 - 38  LV fx shortening, PLAX chordal            36    %        >=29  LV PW thickness, ED                       13    mm       ---------  IVS/LV PW ratio, ED                       0.85           <=1.3  Stroke volume, 2D                         98    ml       ---------  Stroke volume/bsa, 2D                     46    ml/m^2   ---------  LV e&', lateral                            5.33  cm/s     ---------  LV E/e&', lateral                          25.33          ---------  LV e&', medial                             5.98  cm/s     ---------  LV E/e&', medial                            22.58          ---------  LV e&', average                            5.66  cm/s     ---------  LV E/e&', average                          23.87          ---------    Ventricular septum                        Value  Reference  IVS thickness, ED                         11    mm       ---------    LVOT                                      Value          Reference  LVOT ID, S                                21    mm       ---------  LVOT area                                 3.46  cm^2     ---------  LVOT peak velocity, S                     112   cm/s     ---------  LVOT mean velocity, S                     83.5  cm/s     ---------  LVOT VTI, S                               28.2  cm       ---------  LVOT peak gradient, S                     5     mm Hg    ---------    Aortic valve                              Value          Reference  Aortic valve peak velocity, S             446   cm/s     ---------  Aortic valve mean velocity, S             296   cm/s     ---------  Aortic valve VTI, S                       95.8  cm       ---------  Aortic mean gradient, S                   41    mm Hg    ---------  Aortic peak gradient, S                   80    mm Hg    ---------  VTI ratio, LVOT/AV                        0.29           ---------  Aortic valve area, VTI                    1.02  cm^2     ---------  Aortic valve area/bsa, VTI                0.48  cm^2/m^2 ---------  Velocity ratio, peak, LVOT/AV             0.25           ---------  Aortic valve area, peak velocity          0.87  cm^2     ---------  Aortic valve area/bsa, peak               0.41  cm^2/m^2 ---------  velocity  Velocity ratio, mean, LVOT/AV             0.28           ---------  Aortic valve area, mean velocity          0.98  cm^2     ---------  Aortic valve area/bsa, mean               0.46  cm^2/m^2 ---------  velocity  Aortic regurg pressure half-time          413   ms       ---------    Aorta                                      Value          Reference  Aortic root ID, ED                        36    mm       ---------  Ascending aorta ID, A-P, S                37    mm       ---------    Left atrium                               Value          Reference  LA ID, A-P, ES                            47    mm       ---------  LA ID/bsa, A-P                    (H)     2.21  cm/m^2   <=2.2  LA volume, S                              89    ml       ---------  LA volume/bsa, S                          41.9  ml/m^2   ---------  LA volume, ES, 1-p A4C                    81.1  ml       ---------  LA volume/bsa, ES, 1-p A4C                38.2  ml/m^2   ---------  LA volume, ES, 1-p A2C  92.3  ml       ---------  LA volume/bsa, ES, 1-p A2C                43.5  ml/m^2   ---------    Mitral valve                              Value          Reference  Mitral E-wave peak velocity               135   cm/s     ---------  Mitral A-wave peak velocity               137   cm/s     ---------  Mitral mean velocity, D                   99.4  cm/s     ---------  Mitral deceleration time          (H)     335   ms       150 - 230  Mitral pressure half-time                 128   ms       ---------  Mitral mean gradient, D                   5     mm Hg    ---------  Mitral peak gradient, D                   7     mm Hg    ---------  Mitral E/A ratio, peak                    1              ---------  Mitral valve area, PHT, DP                1.95  cm^2     ---------  Mitral valve area/bsa, PHT, DP            0.92  cm^2/m^2 ---------  Mitral valve area, LVOT                   2.78  cm^2     ---------  continuity  Mitral valve area/bsa, LVOT               1.31  cm^2/m^2 ---------  continuity  Mitral annulus VTI, D                     35.1  cm       ---------    Pulmonary arteries                        Value          Reference  PA pressure, S, DP                (H)     33    mm Hg    <=30    Tricuspid  valve                           Value          Reference  Tricuspid  regurg peak velocity            250   cm/s     ---------  Tricuspid peak RV-RA gradient             25    mm Hg    ---------    Systemic veins                            Value          Reference  Estimated CVP                             8     mm Hg    ---------    Right ventricle                           Value          Reference  TAPSE                                     26.1  mm       ---------  RV pressure, S, DP                (H)     33    mm Hg    <=30  RV s&', lateral, S                         12.2  cm/s     ---------  Legend: (L)  and  (H)  mark values outside specified reference range.  ------------------------------------------------------------------- Prepared and Electronically Authenticated by  Ena Dawley, M.D. 2019-04-04T17:34:05   Physicians   Panel Physicians Referring Physician Case Authorizing Physician  [name], MD (Primary)    Procedures   RIGHT/LEFT HEART CATH AND CORONARY ANGIOGRAPHY  Conclusion     Mid LAD lesion is 10% stenosed.  The left ventricular systolic function is normal.  LV end diastolic pressure is normal.  The left ventricular ejection fraction is 55-65% by visual estimate.  There is severe aortic valve stenosis.  Ao sat 99%, PA sat 79%, mean PA 17 mm Hg; mean PCWP 10 mm Hg; CO 6.8 L/min; CI 3.5  Calculated aortic valve area 0.96 cm2.   No significant CAD.  Severe aortic stenosis.  Continue with plan for surgical consultation.    Indications   Severe aortic stenosis [I35.0 (ICD-10-CM)]  Procedural Details/Technique   Technical Details The risks, benefits, and details of the procedure were explained to the patient. The patient verbalized understanding and wanted to proceed. Informed written consent was obtained.  PROCEDURE TECHNIQUE: After Xylocaine anesthesia a 86F slender sheath was placed in the right radial artery with a single  anterior needle wall stick. IV Heparin was given. Right coronary angiography was done using a Judkins R4 guide catheter. Left coronary angiography was done using a Judkins L3.5 guide catheter. Left ventriculography was done using a pigtail catheter. A TR band was used for hemostasis.  AL1 with straight wire used to cross aortic valve.  Contrast: 60 cc   Estimated blood loss <50 mL.  During this procedure the patient was administered the following to achieve and maintain moderate conscious sedation: Versed 4 mg, Fentanyl 75 mcg, while the patient's  heart rate, blood pressure, and oxygen saturation were continuously monitored. The period of conscious sedation was 47 minutes, of which I was present face-to-face 100% of this time.  Complications   Complications documented before study signed (08/23/2017 2:30 PM EDT)    No complications were associated with this study.  Documented by [name], MD - 08/23/2017 2:25 PM EDT    Coronary Findings   Diagnostic  Dominance: Right  Left Anterior Descending  Mid LAD lesion 10% stenosed  Mid LAD lesion is 10% stenosed.  Intervention   No interventions have been documented.  Right Heart   Right Heart Pressures Ao sat 99%, PA sat 79%, mean PA 17 mm Hg; mean PCWP 10 mm Hg; CO 6.8 L/min; CI 3.5  Wall Motion   Resting       All segments of the heart are normal.          Left Heart   Left Ventricle The left ventricular size is normal. The left ventricular systolic function is normal. LV end diastolic pressure is normal. The left ventricular ejection fraction is 55-65% by visual estimate. No regional wall motion abnormalities.  Aortic Valve There is severe aortic valve stenosis. The aortic valve is calcified.  Coronary Diagrams   Diagnostic Diagram       Implants    No implant documentation for this case.  MERGE Images   Show images for CARDIAC CATHETERIZATION   Link to Procedure Log   Procedure Log    Hemo Data     Most Recent Value  Fick Cardiac Output 6.89 L/min  Fick Cardiac Output Index 3.52 (L/min)/BSA  Aortic Mean Gradient 41.2 mmHg  Aortic Peak Gradient 48 mmHg  Aortic Valve Area 0.96  Aortic Value Area Index 0.49 cm2/BSA  RA A Wave 11 mmHg  RA V Wave 6 mmHg  RA Mean 5 mmHg  RV Systolic Pressure 30 mmHg  RV Diastolic Pressure 4 mmHg  RV EDP 7 mmHg  PA Systolic Pressure 26 mmHg  PA Diastolic Pressure 8 mmHg  PA Mean 17 mmHg  PW A Wave 15 mmHg  PW V Wave 14 mmHg  PW Mean 10 mmHg  AO Systolic Pressure 967 mmHg  AO Diastolic Pressure 55 mmHg  AO Mean 74 mmHg  LV Systolic Pressure 893 mmHg  LV Diastolic Pressure 6 mmHg  LV EDP 15 mmHg  Arterial Occlusion Pressure Extended Systolic Pressure 99 mmHg  Arterial Occlusion Pressure Extended Diastolic Pressure 59 mmHg  Arterial Occlusion Pressure Extended Mean Pressure 77 mmHg  Left Ventricular Apex Extended Systolic Pressure 810 mmHg  Left Ventricular Apex Extended Diastolic Pressure 6 mmHg  Left Ventricular Apex Extended EDP Pressure 12 mmHg  QP/QS 1  TPVR Index 4.84 HRUI  TSVR Index 22.74 HRUI  PVR SVR Ratio 0.09  TPVR/TSVR Ratio 0.21    Impression:  I think this patient probably has stage D, severe, symptomatic aortic stenosis with exertional fatigue and shortness of breath with moderate activity levels.  She says that she feels fine but her son and daughter both feel like she is having more symptoms and she is admitting to.  I suspect that she has probably decreased her activity level to accommodate the symptoms.  I have personally reviewed her echocardiogram and cardiac catheterization.  Her echocardiogram shows a trileaflet aortic valve with severe calcification and thickening of the valve leaflets with a mean gradient of 41 mmHg consistent with severe aortic stenosis.  The gradient was 43 mmHg in 07/2015.  She also has  moderate aortic insufficiency.  Her mitral valve annulus is calcified and there is calcium extending down beneath the  anterior mitral leaflet and the left ventricular outflow tract.  Left ventricular systolic function is preserved.  Cardiac catheterization shows no significant coronary disease and normal right heart filling pressures.  I think aortic valve replacement is indicated in this patient to prevent progressive left ventricular deterioration.  Although she has NASH and chronic thrombocytopenia I think she would still be at low risk for open surgical aortic valve replacement.  Given her age of 39 and the calcification extending into the mitral valve and into the left ventricular outflow tract I do not think transcatheter aortic valve replacement would be a good option for her.  With her liver dysfunction I would recommend using a bioprosthetic valve to avoid the need for chronic anticoagulation with Coumadin.  She has a follow-up appointment with her liver specialist at the end of May and would like to wait until after that appointment to proceed with aortic valve replacement.  I think that is a reasonable plan so that we can be sure that there are no contraindications to proceeding with aortic valve replacement.  I discussed the operative procedure with the patient and family including alternatives, benefits and risks; including but not limited to bleeding, blood transfusion, infection, stroke, myocardial infarction, graft failure, heart block requiring a permanent pacemaker, organ dysfunction, and death.  Lia Foyer understands and agrees to proceed.    Plan:  The patient will call us to schedule aortic valve replacement using a bioprosthetic valve after her appointment with her liver specialist at the end of May.  She would like to tentatively plan on doing it in June.   I spent 60 minutes performing this consultation and > 50% of this time was spent face to face counseling and coordinating the care of this patient's severe, symptomatic aortic stenosis.   Gaye Pollack, MD [facility] [phone]

## 2017-09-06 ENCOUNTER — Other Ambulatory Visit: Payer: Self-pay | Admitting: Gastroenterology

## 2017-09-09 ENCOUNTER — Other Ambulatory Visit: Payer: Self-pay | Admitting: Gastroenterology

## 2017-09-09 DIAGNOSIS — K7460 Unspecified cirrhosis of liver: Secondary | ICD-10-CM

## 2017-09-19 ENCOUNTER — Ambulatory Visit
Admission: RE | Admit: 2017-09-19 | Discharge: 2017-09-19 | Disposition: A | Discharge status: Home / Self Care | Payer: Medicare Other | Source: Ambulatory Visit | Attending: Gastroenterology | Admitting: Gastroenterology

## 2017-09-19 DIAGNOSIS — K7460 Unspecified cirrhosis of liver: Secondary | ICD-10-CM

## 2017-10-14 DIAGNOSIS — R200 Anesthesia of skin: Secondary | ICD-10-CM | POA: Insufficient documentation

## 2017-10-22 ENCOUNTER — Other Ambulatory Visit: Payer: Self-pay | Admitting: Oncology

## 2017-10-22 DIAGNOSIS — K7460 Unspecified cirrhosis of liver: Secondary | ICD-10-CM

## 2017-10-22 DIAGNOSIS — D61818 Other pancytopenia: Secondary | ICD-10-CM

## 2017-10-29 ENCOUNTER — Other Ambulatory Visit: Payer: Self-pay | Admitting: Oncology

## 2017-10-29 DIAGNOSIS — M3500 Sicca syndrome, unspecified: Secondary | ICD-10-CM

## 2017-10-29 DIAGNOSIS — K7469 Other cirrhosis of liver: Secondary | ICD-10-CM

## 2017-11-13 DIAGNOSIS — D708 Other neutropenia: Secondary | ICD-10-CM | POA: Diagnosis present

## 2017-11-13 DIAGNOSIS — D696 Thrombocytopenia, unspecified: Secondary | ICD-10-CM | POA: Diagnosis present

## 2017-11-14 ENCOUNTER — Other Ambulatory Visit: Payer: Self-pay | Admitting: *Deleted

## 2017-11-14 DIAGNOSIS — I350 Nonrheumatic aortic (valve) stenosis: Secondary | ICD-10-CM

## 2017-11-15 ENCOUNTER — Encounter: Payer: Self-pay | Admitting: *Deleted

## 2017-12-02 NOTE — Pre-Procedure Instructions (Signed)
Whitney [name]  12/02/2017      [id number]- [name] - [address] [id number]: [phone]: [phone]   Your procedure is scheduled on August 1  [facility]  Call this number if you have problems the morning of surgery:  [phone]   Remember:  Do not eat or drink after midnight.     Take these medicines the morning of surgery with A SIP OF WATER NONE  7 days prior to surgery STOP taking any Aspirin(unless otherwise instructed by your surgeon), Aleve, Naproxen, Ibuprofen, Motrin, Advil, Goody's, BC's, all herbal medications, fish oil, and all vitamins     Do not wear jewelry, make-up or nail polish.  Do not wear lotions, powders, or perfumes, or deodorant.  Do not shave 48 hours prior to surgery.    Do not bring valuables to the hospital.  [facility] for any belongings or valuables.  Contacts, dentures or bridgework may not be worn into surgery.  Leave your suitcase in the car.  After surgery it may be brought to your room.  For patients admitted to the hospital, discharge time will be determined by your treatment team.  Patients discharged the day of surgery will not be allowed to drive home.    Special instructions:   [place]- Preparing For Surgery  Before surgery, you can play an important role. Because skin is not sterile, your skin needs to be as free of germs as possible. You can reduce the number of germs on your skin by washing with CHG (chlorahexidine gluconate) Soap before surgery.  CHG is an antiseptic cleaner which kills germs and bonds with the skin to continue killing germs even after washing.    Oral Hygiene is also important to reduce your risk of infection.  Remember - BRUSH YOUR TEETH THE MORNING OF SURGERY WITH YOUR REGULAR TOOTHPASTE  Please do not use if you have an  allergy to CHG or antibacterial soaps. If your skin becomes reddened/irritated stop using the CHG.  Do not shave (including legs and underarms) for at least 48 hours prior to first CHG shower. It is OK to shave your face.  Please follow these instructions carefully.   1. Shower the NIGHT BEFORE SURGERY and the MORNING OF SURGERY with CHG.   2. If you chose to wash your hair, wash your hair first as usual with your normal shampoo.  3. After you shampoo, rinse your hair and body thoroughly to remove the shampoo.  4. Use CHG as you would any other liquid soap. You can apply CHG directly to the skin and wash gently with a scrungie or a clean washcloth.   5. Apply the CHG Soap to your body ONLY FROM THE NECK DOWN.  Do not use on open wounds or open sores. Avoid contact with your eyes, ears, mouth and genitals (private parts). Wash Face and genitals (private parts)  with your normal soap.  6. Wash thoroughly, paying special attention to the area where your surgery will be performed.  7. Thoroughly rinse your body with warm water from the neck down.  8. DO NOT shower/wash with your normal soap after using and rinsing off the CHG Soap.  9. Pat yourself dry with a CLEAN TOWEL.  10. Wear CLEAN PAJAMAS to bed the night before surgery, wear comfortable clothes the morning  of surgery  11. Place CLEAN SHEETS on your bed the night of your first shower and DO NOT SLEEP WITH PETS.    Day of Surgery:  Do not apply any deodorants/lotions.  Please wear clean clothes to the hospital/surgery center.   Remember to brush your teeth WITH YOUR REGULAR TOOTHPASTE.    Please read over the following fact sheets that you were given.

## 2017-12-03 ENCOUNTER — Encounter: Payer: Self-pay | Admitting: Surgery

## 2017-12-03 ENCOUNTER — Encounter (HOSPITAL_COMMUNITY): Payer: Self-pay

## 2017-12-03 ENCOUNTER — Ambulatory Visit (HOSPITAL_COMMUNITY)
Admission: RE | Admit: 2017-12-03 | Discharge: 2017-12-03 | Disposition: A | Discharge status: Home / Self Care | Payer: Medicare Other | Source: Ambulatory Visit | Attending: Surgery | Admitting: Surgery

## 2017-12-03 ENCOUNTER — Ambulatory Visit (HOSPITAL_BASED_OUTPATIENT_CLINIC_OR_DEPARTMENT_OTHER)
Admission: RE | Admit: 2017-12-03 | Discharge: 2017-12-03 | Disposition: A | Discharge status: Home / Self Care | Payer: Medicare Other | Source: Ambulatory Visit | Attending: Surgery | Admitting: Surgery

## 2017-12-03 ENCOUNTER — Ambulatory Visit: Payer: Medicare Other | Admitting: Surgery

## 2017-12-03 ENCOUNTER — Other Ambulatory Visit: Payer: Self-pay

## 2017-12-03 ENCOUNTER — Encounter (HOSPITAL_COMMUNITY)
Admission: RE | Admit: 2017-12-03 | Discharge: 2017-12-03 | Disposition: A | Discharge status: Home / Self Care | Payer: Medicare Other | Source: Ambulatory Visit | Attending: Family Medicine | Admitting: Family Medicine

## 2017-12-03 VITALS — BP 113/79 | HR 85 | Resp 18 | Ht 64.0 in | Wt 217.0 lb

## 2017-12-03 DIAGNOSIS — Z01812 Encounter for preprocedural laboratory examination: Secondary | ICD-10-CM

## 2017-12-03 DIAGNOSIS — I700 Atherosclerosis of aorta: Secondary | ICD-10-CM

## 2017-12-03 DIAGNOSIS — I350 Nonrheumatic aortic (valve) stenosis: Secondary | ICD-10-CM

## 2017-12-03 DIAGNOSIS — Z01810 Encounter for preprocedural cardiovascular examination: Secondary | ICD-10-CM | POA: Insufficient documentation

## 2017-12-03 HISTORY — DX: Dyspnea, unspecified: R06.00

## 2017-12-03 LAB — URINALYSIS, ROUTINE W REFLEX MICROSCOPIC
Bilirubin Urine: NEGATIVE
GLUCOSE, UA: NEGATIVE mg/dL
Hgb urine dipstick: NEGATIVE
Ketones, ur: NEGATIVE mg/dL
Nitrite: NEGATIVE
PH: 6 (ref 5.0–8.0)
Protein, ur: NEGATIVE mg/dL
Specific Gravity, Urine: 1.02 (ref 1.005–1.030)

## 2017-12-03 LAB — COMPREHENSIVE METABOLIC PANEL
ALBUMIN: 3.2 g/dL — AB (ref 3.5–5.0)
ALT: 17 U/L (ref 0–44)
ANION GAP: 9 (ref 5–15)
AST: 34 U/L (ref 15–41)
Alkaline Phosphatase: 63 U/L (ref 38–126)
BILIRUBIN TOTAL: 1.5 mg/dL — AB (ref 0.3–1.2)
BUN: 11 mg/dL (ref 8–23)
CO2: 23 mmol/L (ref 22–32)
Calcium: 8.8 mg/dL — ABNORMAL LOW (ref 8.9–10.3)
Chloride: 106 mmol/L (ref 98–111)
Creatinine, Ser: 0.59 mg/dL (ref 0.44–1.00)
GFR calc Af Amer: >60 mL/min (ref >60)
GFR calc non Af Amer: >60 mL/min (ref >60)
GLUCOSE: 84 mg/dL (ref 70–99)
POTASSIUM: 4.1 mmol/L (ref 3.5–5.1)
Sodium: 138 mmol/L (ref 135–145)
TOTAL PROTEIN: 6.2 g/dL — AB (ref 6.5–8.1)

## 2017-12-03 LAB — PULMONARY FUNCTION TEST
DL/VA % pred: 93 %
DL/VA: 4.49 ml/min/mmHg/L
DLCO UNC: 16.06 ml/min/mmHg
DLCO unc % pred: 66 %
FEF 25-75 Post: 2.18 L/sec
FEF 25-75 Pre: 1.6 L/sec
FEF2575-%CHANGE-POST: 36 %
FEF2575-%PRED-POST: 100 %
FEF2575-%PRED-PRE: 73 %
FEV1-%Change-Post: 7 %
FEV1-%Pred-Post: 74 %
FEV1-%Pred-Pre: 68 %
FEV1-POST: 1.8 L
FEV1-Pre: 1.67 L
FEV1FVC-%Change-Post: 2 %
FEV1FVC-%PRED-PRE: 106 %
FEV6-%Change-Post: 4 %
FEV6-%PRED-POST: 69 %
FEV6-%Pred-Pre: 66 %
FEV6-Post: 2.13 L
FEV6-Pre: 2.03 L
FEV6FVC-%PRED-POST: 104 %
FEV6FVC-%PRED-PRE: 104 %
FVC-%CHANGE-POST: 4 %
FVC-%Pred-Post: 66 %
FVC-%Pred-Pre: 63 %
FVC-Post: 2.13 L
FVC-Pre: 2.03 L
POST FEV1/FVC RATIO: 85 %
Post FEV6/FVC ratio: 100 %
Pre FEV1/FVC ratio: 82 %
Pre FEV6/FVC Ratio: 100 %
RV % pred: 77 %
RV: 1.6 L
TLC % pred: 73 %
TLC: 3.73 L

## 2017-12-03 LAB — TYPE AND SCREEN
ABO/RH(D): A POS
Antibody Screen: NEGATIVE

## 2017-12-03 LAB — URINALYSIS, MICROSCOPIC (REFLEX): RBC / HPF: NONE SEEN RBC/hpf (ref 0–5)

## 2017-12-03 LAB — BLOOD GAS, ARTERIAL
ACID-BASE EXCESS: 1.8 mmol/L (ref 0.0–2.0)
BICARBONATE: 25.4 mmol/L (ref 20.0–28.0)
Drawn by: 470591
FIO2: 21
O2 Saturation: 98.8 %
PATIENT TEMPERATURE: 98.6
PH ART: 7.451 — AB (ref 7.350–7.450)
pCO2 arterial: 37.1 mmHg (ref 32.0–48.0)
pO2, Arterial: 129 mmHg — ABNORMAL HIGH (ref 83.0–108.0)

## 2017-12-03 LAB — HEMOGLOBIN A1C
HEMOGLOBIN A1C: 5.5 % (ref 4.8–5.6)
MEAN PLASMA GLUCOSE: 111.15 mg/dL

## 2017-12-03 LAB — PROTIME-INR
INR: 1.28
PROTHROMBIN TIME: 15.8 s — AB (ref 11.4–15.2)

## 2017-12-03 LAB — APTT: APTT: 40 s — AB (ref 24–36)

## 2017-12-03 LAB — CBC
HEMATOCRIT: 42.8 % (ref 36.0–46.0)
Hemoglobin: 13.4 g/dL (ref 12.0–15.0)
MCH: 28.9 pg (ref 26.0–34.0)
MCHC: 31.3 g/dL (ref 30.0–36.0)
MCV: 92.4 fL (ref 78.0–100.0)
Platelets: 71 10*3/uL — ABNORMAL LOW (ref 150–400)
RBC: 4.63 MIL/uL (ref 3.87–5.11)
RDW: 14.6 % (ref 11.5–15.5)
WBC: 3.3 10*3/uL — ABNORMAL LOW (ref 4.0–10.5)

## 2017-12-03 LAB — SURGICAL PCR SCREEN
MRSA, PCR: NEGATIVE
Staphylococcus aureus: POSITIVE — AB

## 2017-12-03 LAB — ABO/RH: ABO/RH(D): A POS

## 2017-12-03 MED ORDER — ALBUTEROL SULFATE (2.5 MG/3ML) 0.083% IN NEBU
2.5000 mg | INHALATION_SOLUTION | Freq: Once | RESPIRATORY_TRACT | Status: AC
  Administered 2017-12-03: 2.5 mg via RESPIRATORY_TRACT

## 2017-12-03 NOTE — Progress Notes (Signed)
Pre-op Cardiac Surgery  Carotid Findings:   Bilateral vertebral arteries patent with antegrade flow.  Bilateral ICAs WNL.  Upper Extremity Right Left  Brachial Pressures 104 107  Radial Waveforms Triphasic Triphasic  Ulnar Waveforms Triphasic Triphasic  Palmar Arch (Allen's Test) Patent  Patent    Lower  Extremity Right Left  Dorsalis Pedis 127 139  Anterior Tibial    Posterior Tibial 135 121  Ankle/Brachial Indices 1.26 1.30   Whitney [name] (RDMS RVT) 12/03/17 3:13 PM

## 2017-12-03 NOTE — Progress Notes (Signed)
     HPI:  The patient returns today for further discussion of planned aortic valve replacement surgery on Thursday.  I last saw her on 09/03/2017 and she was feeling okay at that time and wanted to wait until after she had seen her liver specialist at the end of May to schedule the surgery.  She has NASH with chronic thrombocytopenia.  She said that her liver specialist did not see any contraindication to proceeding with aortic valve replacement.  Since I last saw her she has noted progression of exertional fatigue and tiredness through the month of June.  She said that she has been able to do some activities during the day but is usually exhausted by the evening.  Current Outpatient Medications  Medication Sig Dispense Refill  . ezetimibe (ZETIA) 10 MG tablet Take 10 mg by mouth at bedtime.   11  . OVER THE COUNTER MEDICATION Take 1 packet by mouth 2 (two) times daily. Bariatric Multivitamin     No current facility-administered medications for this visit.      Physical Exam: BP 113/79 (BP Location: Left Arm, Patient Position: Sitting, Cuff Size: Normal)   Pulse 85   Resp 18   Ht 5' 4"  (1.626 m)   Wt 217 lb (98.4 kg)   SpO2 93% Comment: RA  BMI 37.25 kg/m  She looks well. Cardiac exam shows a regular rate and rhythm with a 3/6 systolic murmur along the right sternal border. Lungs are clear. There is no peripheral edema.  Diagnostic Tests:  Her preoperative laboratory studies are significant for platelet count of 71,000.  Her liver function profile is fairly normal with a total bilirubin of 1.5.  INR is 1.28.  Impression:  She has severe aortic stenosis and we are planning aortic valve replacement on Thursday this week.  We discussed the alternatives of mechanical and bioprosthetic valves again.  I think she would be best served with a bioprosthetic valve given her liver disease and chronic thrombocytopenia.  She is in full agreement with that.  She understands that there is a  small risk of structural valve deterioration over her lifetime and accepts that risk.  Plan:  Aortic valve replacement using a bioprosthetic valve on Thursday, 12/05/2017.   Gaye Pollack, MD [facility] [phone]

## 2017-12-03 NOTE — Progress Notes (Addendum)
PCP - [place]  Chest x-ray - 12/03/17 EKG - 12/03/17 Stress Test - denies ECHO - 2019 Cardiac Cath2019 -   Sleep Study - years ago has had gastric surgery since and doesn't use CPAP CPAP -   Fasting Blood Sugar - doesn't check just diet controlled    Anesthesia review: yes  Patient verbalized left side chest pain that is constant with shortness of breath and fatigue with exercise.  Patient stated that this is something that she has spoke with Dr. Cyndia Bent about.  Ryan called left message of symptoms since the patient has an appointment with D.r bartle today at 1630   Patient verbalized understanding of instructions that were given to them at the PAT appointment. Patient was also instructed that they will need to review over the PAT instructions again at home before surgery.

## 2017-12-04 MED ORDER — TRANEXAMIC ACID (OHS) BOLUS VIA INFUSION
15.0000 mg/kg | INTRAVENOUS | Status: AC
  Administered 2017-12-05: 1476 mg via INTRAVENOUS
  Filled 2017-12-04: qty 1476

## 2017-12-04 MED ORDER — SODIUM CHLORIDE 0.9 % IV SOLN
30.0000 ug/min | INTRAVENOUS | Status: AC
  Administered 2017-12-05: 40 ug/min via INTRAVENOUS
  Filled 2017-12-04: qty 2

## 2017-12-04 MED ORDER — SODIUM CHLORIDE 0.9 % IV SOLN
INTRAVENOUS | Status: AC
  Administered 2017-12-05: .7 [IU]/h via INTRAVENOUS
  Filled 2017-12-04: qty 1

## 2017-12-04 MED ORDER — PLASMA-LYTE 148 IV SOLN
INTRAVENOUS | Status: DC
  Filled 2017-12-04: qty 2.5

## 2017-12-04 MED ORDER — TRANEXAMIC ACID (OHS) PUMP PRIME SOLUTION
2.0000 mg/kg | INTRAVENOUS | Status: DC
  Filled 2017-12-04: qty 1.97

## 2017-12-04 MED ORDER — NITROGLYCERIN IN D5W 200-5 MCG/ML-% IV SOLN
2.0000 ug/min | INTRAVENOUS | Status: DC
  Filled 2017-12-04: qty 250

## 2017-12-04 MED ORDER — DOPAMINE-DEXTROSE 3.2-5 MG/ML-% IV SOLN
0.0000 ug/kg/min | INTRAVENOUS | Status: DC
  Filled 2017-12-04: qty 250

## 2017-12-04 MED ORDER — VANCOMYCIN HCL 10 G IV SOLR
1500.0000 mg | INTRAVENOUS | Status: AC
  Administered 2017-12-05: 1500 mg via INTRAVENOUS
  Filled 2017-12-04: qty 1500

## 2017-12-04 MED ORDER — EPINEPHRINE PF 1 MG/ML IJ SOLN
0.0000 ug/min | INTRAVENOUS | Status: DC
  Filled 2017-12-04: qty 4

## 2017-12-04 MED ORDER — SODIUM CHLORIDE 0.9 % IV SOLN
750.0000 mg | INTRAVENOUS | Status: AC
  Administered 2017-12-05: 750 mg via INTRAVENOUS
  Filled 2017-12-04: qty 750

## 2017-12-04 MED ORDER — MILRINONE LACTATE IN DEXTROSE 20-5 MG/100ML-% IV SOLN
0.1250 ug/kg/min | INTRAVENOUS | Status: DC
  Filled 2017-12-04 (×2): qty 100

## 2017-12-04 MED ORDER — SODIUM CHLORIDE 0.9 % IV SOLN
INTRAVENOUS | Status: DC
  Filled 2017-12-04: qty 30

## 2017-12-04 MED ORDER — POTASSIUM CHLORIDE 2 MEQ/ML IV SOLN
80.0000 meq | INTRAVENOUS | Status: DC
  Filled 2017-12-04: qty 40

## 2017-12-04 MED ORDER — DEXMEDETOMIDINE HCL IN NACL 400 MCG/100ML IV SOLN
0.1000 ug/kg/h | INTRAVENOUS | Status: AC
  Administered 2017-12-05: 0.7 ug/kg/h via INTRAVENOUS
  Filled 2017-12-04: qty 100

## 2017-12-04 MED ORDER — SODIUM CHLORIDE 0.9 % IV SOLN
1.5000 g | INTRAVENOUS | Status: AC
  Administered 2017-12-05: 1.5 g via INTRAVENOUS
  Filled 2017-12-04: qty 1.5

## 2017-12-04 MED ORDER — MAGNESIUM SULFATE 50 % IJ SOLN
40.0000 meq | INTRAMUSCULAR | Status: DC
  Filled 2017-12-04: qty 9.85

## 2017-12-04 MED ORDER — SODIUM CHLORIDE 0.9 % IV SOLN
1.5000 mg/kg/h | INTRAVENOUS | Status: AC
  Administered 2017-12-05: 1.5 mg/kg/h via INTRAVENOUS
  Filled 2017-12-04: qty 25

## 2017-12-04 NOTE — H&P (Signed)
[address]       [place] [address]             [phone]      Cardiothoracic Surgery Admission History and Physical   PCP is Hulan Fess, MD  Referring Provider is [name], MD      Chief Complaint  Patient presents with  . Aortic Stenosis       HPI:  The patient is a 62 year old woman with a history of diabetes, hyperlipidemia, Sjogren's syndrome, thrombocytopenia, obstructive sleep apnea fibromyalgia, NASH followed at [facility] (Dr. Gerald Dexter), rheumatic fever and known severe aortic stenosis, and morbid obesity with significant weight loss after gastric sleeve surgery. She had an echocardiogram in March 2017 which showed a mean gradient across aortic valve of 43 mmHg. Her most recent echocardiogram on 08/08/2017 shows a trileaflet aortic valve with severe thickening and calcification of the leaflets with a mean gradient of 41 mmHg and a peak gradient of 80 mmHg. The DVI was 0.25. There is moderate regurgitation. The mitral valve had a calcified annulus with mildly thickened and calcified leaflets with findings of mild mitral stenosis with a mean gradient of 5 mmHg. Left ventricular systolic function was normal with moderate concentric left ventricular hypertrophy and grade 1 diastolic dysfunction. The patient says that she feels like she is doing well and not really having any symptoms although she does report that her stamina is not as good as it was a year ago and she can only do activities around the house for about 3 hours before she has done for the day. She denies any substernal chest discomfort. She has had occasional episodes of lightheadedness particularly with position changes. She denies orthopnea and PND. She has had no peripheral edema. Her son and daughter with her today and feel like she is having more symptoms and she admits to.  She is a retired Nature conservation officer. She says that she lost about 100 pounds after her gastric sleeve surgery  but gained about 25 pounds since she has retired.       Past Medical History:  Diagnosis Date  . Allergic rhinitis   . Anemia    hx  . Arthritis   . Asthma    hx yrs ago  . Cirrhosis ([place]) last albumin 3.3 done at [facility] 06-16-2014 (under care everywhere tab in epic)   Secondary to Fatty liver -- followed by hepatology at [facility] (dr Gerald Dexter)   . Depression   . Diabetes mellitus type II   . Fibromyalgia   . GERD (gastroesophageal reflux disease)   . Heart murmur    asymptomatic --- 1989 from rhuematic fever  . History of exercise stress test    05-05-2013--- negative bruce ETT given exercise workload, no ischemia  . History of hiatal hernia   . History of kidney stones   . History of rheumatic fever    1989  . Hyperlipidemia   . Leukocytopenia   . Moderate aortic stenosis    AVA area 1.1cm2--- cardiologist -- dr Concepcion Living, 2014 in epic  . NASH (nonalcoholic steatohepatitis)   . OSA (obstructive sleep apnea)    was using CPAP before gastric sleeve 2015-- no uses after wt loss  . Pneumonia    hx  . Sjogren's syndrome ([place])   . Thrombocytopenia ([place])         Past Surgical History:  Procedure Laterality Date  . CYSTOSCOPY WITH RETROGRADE PYELOGRAM, URETEROSCOPY AND STENT PLACEMENT Left  01/21/2015   Procedure: CYSTOSCOPY WITH LEFT RETROGRADE PYELOGRAM, LEFT URETEROSCOPY AND STENT PLACEMENT; Surgeon: Festus Aloe, MD; Location: [facility]; Service: Urology; Laterality: Left;  . CYSTOSCOPY WITH RETROGRADE PYELOGRAM, URETEROSCOPY AND STENT PLACEMENT Bilateral 02/24/2015   Procedure: CYSTOSCOPY WITH RIGHT RETROGRADE PYELOGRAM, BLADDER BIOPSY FULGERATION LEFT URETEROSCOPY AND STENT REPLACEMENT; Surgeon: Festus Aloe, MD; Location: [facility]; Service: Urology; Laterality: Bilateral;  . EXPLORATORY LAPAROSCOPY W/ CONE BIOPSY'S LEFT AND RIGHT LOBE OF LIVER  11-04-2007  . HOLMIUM LASER APPLICATION Left [id number]   Procedure: HOLMIUM LASER  LITHOTRIPSY; Surgeon: Festus Aloe, MD; Location: [facility]; Service: Urology; Laterality: Left;  . HYSTEROSCOPY W/D&C N/A 12/11/2012   Procedure: DILATATION AND CURETTAGE /HYSTEROSCOPY; Surgeon: Maeola Sarah. Landry Mellow, MD; Location: [facility]; Service: Gynecology; Laterality: N/A;  . INGUINAL HERNIA REPAIR Left 10/22/2016   Procedure: LEFT INGUINAL HERNIA REPAIR; Surgeon: Rolm Bookbinder, MD; Location: [facility]; Service: General; Laterality: Left; TAP BLOCK  . INSERTION OF MESH Left 10/22/2016   Procedure: INSERTION OF MESH; Surgeon: Rolm Bookbinder, MD; Location: [facility]; Service: General; Laterality: Left;  . LAPAROSCOPIC GASTRIC SLEEVE RESECTION  07-27-2013  . PUBOVAGINAL SLING  04-10-2001   [place]  . RIGHT/LEFT HEART CATH AND CORONARY ANGIOGRAPHY N/A 08/23/2017   Procedure: RIGHT/LEFT HEART CATH AND CORONARY ANGIOGRAPHY; Surgeon: [name], MD; Location: [facility]; Service: Cardiovascular; Laterality: N/A;  . TONSILLECTOMY  1975  . TRANSTHORACIC ECHOCARDIOGRAM  06-04-2012 dr Irish Lack   mild LVH, grade I diastolic dysfunction/ ef 01-75%/ moderate LAE/ mild MV calcifation without stenosis, mild MR/ moderate AV stenosis, cannot r/o bicupsid, area 1.1cm2/ mild dilated aortic root/ trivial TR        Family History  Problem Relation Age of Onset  . Alzheimer's disease Father   . Hip fracture Father   . Asthma Father   . Heart disease Father   . Heart attack Father   . Hypertension Father   . Rheum arthritis Mother   . Heart disease Mother   . Allergies Daughter   . Asthma Paternal Grandmother   . Asthma Daughter   . Stroke Neg Hx    Social History  Social History        Tobacco Use  . Smoking status: Never Smoker  . Smokeless tobacco: Never Used  Substance Use Topics  . Alcohol use: Yes    Comment: rarely  . Drug use: No         Current Outpatient Medications  Medication Sig Dispense Refill  . buPROPion (WELLBUTRIN SR) 150 MG 12 hr tablet Take 150  mg by mouth daily.    Marland Kitchen ezetimibe (ZETIA) 10 MG tablet Take 10 mg by mouth at bedtime.   11  . Multiple Vitamin (MULTI VITAMIN) TABS Take 1 tablet by mouth daily.     Marland Kitchen OVER THE COUNTER MEDICATION Take 1 packet by mouth 2 (two) times daily. Bariatric Multivitamin Powder     No current facility-administered medications for this visit.        Allergies  Allergen Reactions  . Doxycycline Hives and Rash  . Naproxen Rash and Hives   Review of Systems  Constitutional: Positive for activity change and fatigue. Negative for chills, fever and unexpected weight change.  HENT: Negative.  Saw her dentist in 06/2017  Eyes: Negative.  Respiratory:  Some exertional shortness of breath.  Cardiovascular: Negative for chest pain, palpitations and leg swelling.  Gastrointestinal: Negative.  Endocrine: Negative.  Genitourinary: Negative.  Musculoskeletal: Positive for myalgias.  Skin: Negative.  Neurological: Positive for dizziness.  Hematological: Negative.  Psychiatric/Behavioral: Negative.   BP 115/79 (BP Location: Left Arm, Patient Position: Sitting, Cuff Size: Large)  Pulse 97  Resp 16  Ht 5' 4"  (1.626 m)  Wt 211 lb (95.7 kg)  SpO2 97% Comment: RA  BMI 36.22 kg/m  Physical Exam  Constitutional: She is oriented to person, place, and time. She appears well-developed and well-nourished. No distress.  HENT:  Head: Normocephalic and atraumatic.  Mouth/Throat: Oropharynx is clear and moist.  Eyes: Pupils are equal, round, and reactive to light. Conjunctivae and EOM are normal. No scleral icterus.  Neck: Normal range of motion. Neck supple. No JVD present. No thyromegaly present.  Cardiovascular: Normal rate, regular rhythm and intact distal pulses.  Murmur heard. Harsh 3/6 systolic murmur RSB, no diastolic murmur  Pulmonary/Chest: Effort normal and breath sounds normal. No respiratory distress.  Abdominal: Soft. Bowel sounds are normal. She exhibits no distension. There is no tenderness.    Musculoskeletal: She exhibits no edema.  Lymphadenopathy:  She has no cervical adenopathy.  Neurological: She is alert and oriented to person, place, and time. No cranial nerve deficit.  Skin: Skin is warm and dry.  Psychiatric: She has a normal mood and affect.   Diagnostic Tests:  [facility]  [address], [place] [address]  [phone]  -------------------------------------------------------------------  Transthoracic Echocardiography  Patient: Whitney, [name]  MR #: [id number]  Study Date: 08/08/2017  Gender: F  Age: 89  Height: 162.6 cm  Weight: 95.7 kg  BSA: 2.12 m^2  Pt. Status:  Room:  [place], MD  SONOGRAPHER Wyatt Mage, RDCS  ORDERING [place], [place], Outpatient  cc:  -------------------------------------------------------------------  -------------------------------------------------------------------  Indications: Aortic valve disease (I35.9).  -------------------------------------------------------------------  History: PMH: Aortic valve disease. Risk factors: OSA. LE  edema. Dyslipidemia.  -------------------------------------------------------------------  Study Conclusions  - Left ventricle: The cavity size was normal. There was moderate  concentric hypertrophy. Systolic function was normal. Wall motion  was normal; there were no regional wall motion abnormalities.  Doppler parameters are consistent with abnormal left ventricular  relaxation (grade 1 diastolic dysfunction). Doppler parameters  are consistent with elevated ventricular end-diastolic filling  pressure.  - Aortic valve: Trileaflet; severely thickened, severely calcified  leaflets. There was severe stenosis. There was moderate  regurgitation. Mean gradient (S): 41 mm Hg. Peak gradient (S): 80  mm Hg.  - Mitral valve: Calcified annulus. Mildly thickened leaflets . The  findings are consistent with  mild stenosis. There was mild  regurgitation. Mean gradient (D): 5 mm Hg.  - Left atrium: The atrium was moderately dilated.  - Right atrium: The atrium was mildly dilated.  - Tricuspid valve: There was mild regurgitation.  - Pulmonary arteries: Systolic pressure was mildly increased. PA  peak pressure: 33 mm Hg (S).  - Inferior vena cava: The vessel was normal in size.  - Pericardium, extracardiac: There was no pericardial effusion.  Impressions:  - Severe aortic stenosis, moderate aortic regurgitation, mild  mitral stenosis.  -------------------------------------------------------------------  Labs, prior tests, procedures, and surgery:  Transthoracic echocardiography (07/07/2015). The aortic valve  showed severe stenosis. EF was 60%. Aortic valve: peak gradient of  72 mm Hg and mean gradient of 43 mm Hg.  -------------------------------------------------------------------  Study data: Comparison was made to the study of 07/07/2015. Study  status: Routine. Procedure: The patient reported no pain pre or  post test. Transthoracic echocardiography. Image quality was  adequate. Study completion: There were no complications.  Transthoracic echocardiography. M-mode, complete 2D, spectral  Doppler, and color Doppler. Birthdate: Patient birthdate:  07-07-1955. Age: Patient is 62 yr old. Sex: Gender: female.  BMI: 36.2 kg/m^2. Blood pressure: 110/78 Patient status:  Outpatient. Study date: Study date: 08/08/2017. Study time: 10:33  AM. Location: [facility]  -------------------------------------------------------------------  -------------------------------------------------------------------  Left ventricle: The cavity size was normal. There was moderate  concentric hypertrophy. Systolic function was normal. Wall motion  was normal; there were no regional wall motion abnormalities.  Doppler parameters are consistent with abnormal left ventricular  relaxation (grade 1 diastolic  dysfunction). Doppler parameters are  consistent with elevated ventricular end-diastolic filling  pressure.  -------------------------------------------------------------------  Aortic valve: Trileaflet; severely thickened, severely calcified  leaflets. Mobility was not restricted. Doppler: There was severe  stenosis. There was moderate regurgitation. VTI ratio of LVOT  to aortic valve: 0.29. Valve area (VTI): 1.02 cm^2. Indexed valve  area (VTI): 0.48 cm^2/m^2. Peak velocity ratio of LVOT to aortic  valve: 0.25. Valve area (Vmax): 0.87 cm^2. Indexed valve area  (Vmax): 0.41 cm^2/m^2. Mean velocity ratio of LVOT to aortic valve:  0.28. Valve area (Vmean): 0.98 cm^2. Indexed valve area (Vmean):  0.46 cm^2/m^2. Mean gradient (S): 41 mm Hg. Peak gradient (S):  80 mm Hg.  -------------------------------------------------------------------  Aorta: Aortic root: The aortic root was normal in size.  -------------------------------------------------------------------  Mitral valve: Calcified annulus. Mildly thickened leaflets .  Mobility was not restricted. Doppler: The findings are  consistent with mild stenosis. There was mild regurgitation.  Valve area by pressure half-time: 1.95 cm^2. Indexed valve area by  pressure half-time: 0.92 cm^2/m^2. Valve area by continuity  equation (using LVOT flow): 2.78 cm^2. Indexed valve area by  continuity equation (using LVOT flow): 1.31 cm^2/m^2. Mean  gradient (D): 5 mm Hg. Peak gradient (D): 7 mm Hg.  -------------------------------------------------------------------  Left atrium: The atrium was moderately dilated.  -------------------------------------------------------------------  Right ventricle: The cavity size was normal. Wall thickness was  normal. Systolic function was normal.  -------------------------------------------------------------------  Pulmonic valve: Structurally normal valve. Cusp separation was  normal. Doppler: Transvalvular  velocity was within the normal  range. There was no evidence for stenosis. There was no  regurgitation.  -------------------------------------------------------------------  Tricuspid valve: Structurally normal valve. Doppler:  Transvalvular velocity was within the normal range. There was mild  regurgitation.  -------------------------------------------------------------------  Pulmonary artery: The main pulmonary artery was normal-sized.  Systolic pressure was mildly increased.  -------------------------------------------------------------------  Right atrium: The atrium was mildly dilated.  -------------------------------------------------------------------  Pericardium: There was no pericardial effusion.  -------------------------------------------------------------------  Systemic veins:  Inferior vena cava: The vessel was normal in size.  -------------------------------------------------------------------  Measurements  Left ventricle Value Reference  LV ID, ED, PLAX chordal 47 mm 43 - 52  LV ID, ES, PLAX chordal 30 mm 23 - 38  LV fx shortening, PLAX chordal 36 % >=29  LV PW thickness, ED 13 mm ---------  IVS/LV PW ratio, ED 0.85 <=1.3  Stroke volume, 2D 98 ml ---------  Stroke volume/bsa, 2D 46 ml/m^2 ---------  LV e&', lateral 5.33 cm/s ---------  LV E/e&', lateral 25.33 ---------  LV e&', medial 5.98 cm/s ---------  LV E/e&', medial 22.58 ---------  LV e&', average 5.66 cm/s ---------  LV E/e&', average 23.87 ---------  Ventricular septum Value Reference  IVS thickness, ED 11 mm ---------  LVOT Value Reference  LVOT ID, S 21 mm ---------  LVOT area 3.46 cm^2 ---------  LVOT peak velocity, S 112 cm/s ---------  LVOT mean velocity, S 83.5 cm/s ---------  LVOT VTI, S 28.2 cm ---------  LVOT peak gradient, S 5 mm Hg ---------  Aortic valve Value Reference  Aortic valve peak velocity, S 446 cm/s ---------  Aortic valve mean velocity, S 296 cm/s ---------  Aortic valve  VTI, S 95.8 cm ---------  Aortic mean gradient, S 41 mm Hg ---------  Aortic peak gradient, S 80 mm Hg ---------  VTI ratio, LVOT/AV 0.29 ---------  Aortic valve area, VTI 1.02 cm^2 ---------  Aortic valve area/bsa, VTI 0.48 cm^2/m^2 ---------  Velocity ratio, peak, LVOT/AV 0.25 ---------  Aortic valve area, peak velocity 0.87 cm^2 ---------  Aortic valve area/bsa, peak 0.41 cm^2/m^2 ---------  velocity  Velocity ratio, mean, LVOT/AV 0.28 ---------  Aortic valve area, mean velocity 0.98 cm^2 ---------  Aortic valve area/bsa, mean 0.46 cm^2/m^2 ---------  velocity  Aortic regurg pressure half-time 413 ms ---------  Aorta Value Reference  Aortic root ID, ED 36 mm ---------  Ascending aorta ID, A-P, S 37 mm ---------  Left atrium Value Reference  LA ID, A-P, ES 47 mm ---------  LA ID/bsa, A-P (H) 2.21 cm/m^2 <=2.2  LA volume, S 89 ml ---------  LA volume/bsa, S 41.9 ml/m^2 ---------  LA volume, ES, 1-p A4C 81.1 ml ---------  LA volume/bsa, ES, 1-p A4C 38.2 ml/m^2 ---------  LA volume, ES, 1-p A2C 92.3 ml ---------  LA volume/bsa, ES, 1-p A2C 43.5 ml/m^2 ---------  Mitral valve Value Reference  Mitral E-wave peak velocity 135 cm/s ---------  Mitral A-wave peak velocity 137 cm/s ---------  Mitral mean velocity, D 99.4 cm/s ---------  Mitral deceleration time (H) 335 ms 150 - 230  Mitral pressure half-time 128 ms ---------  Mitral mean gradient, D 5 mm Hg ---------  Mitral peak gradient, D 7 mm Hg ---------  Mitral E/A ratio, peak 1 ---------  Mitral valve area, PHT, DP 1.95 cm^2 ---------  Mitral valve area/bsa, PHT, DP 0.92 cm^2/m^2 ---------  Mitral valve area, LVOT 2.78 cm^2 ---------  continuity  Mitral valve area/bsa, LVOT 1.31 cm^2/m^2 ---------  continuity  Mitral annulus VTI, D 35.1 cm ---------  Pulmonary arteries Value Reference  PA pressure, S, DP (H) 33 mm Hg <=30  Tricuspid valve Value Reference  Tricuspid regurg peak velocity 250 cm/s ---------  Tricuspid peak  RV-RA gradient 25 mm Hg ---------  Systemic veins Value Reference  Estimated CVP 8 mm Hg ---------  Right ventricle Value Reference  TAPSE 26.1 mm ---------  RV pressure, S, DP (H) 33 mm Hg <=30  RV s&', lateral, S 12.2 cm/s ---------  Legend:  (L) and (H) mark values outside specified reference range.  -------------------------------------------------------------------  Prepared and Electronically Authenticated by  Ena Dawley, M.D.  2019-04-04T17:34:05  Physicians  Panel Physicians Referring Physician Case Authorizing Physician  [name], MD (Primary)    Procedures  RIGHT/LEFT HEART CATH AND CORONARY ANGIOGRAPHY  Conclusion  Mid LAD lesion is 10% stenosed.  The left ventricular systolic function is normal.  LV end diastolic pressure is normal.  The left ventricular ejection fraction is 55-65% by visual estimate.  There is severe aortic valve stenosis.  Ao sat 99%, PA sat 79%, mean PA 17 mm Hg; mean PCWP 10 mm Hg; CO 6.8 L/min; CI 3.5  Calculated aortic valve area 0.96 cm2. No significant CAD. Severe aortic stenosis. Continue with plan for surgical consultation.   Indications  Severe aortic stenosis [I35.0 (ICD-10-CM)]  Procedural Details/Technique  Technical Details The risks, benefits, and details of the  procedure were explained to the patient. The patient verbalized understanding and wanted to proceed. Informed written consent was obtained.  PROCEDURE TECHNIQUE: After Xylocaine anesthesia a 59F slender sheath was placed in the right radial artery with a single anterior needle wall stick. IV Heparin was given. Right coronary angiography was done using a Judkins R4 guide catheter. Left coronary angiography was done using a Judkins L3.5 guide catheter. Left ventriculography was done using a pigtail catheter. A TR band was used for hemostasis.  AL1 with straight wire used to cross aortic valve.  Contrast: 60 cc   Estimated blood loss <50 mL.  During this  procedure the patient was administered the following to achieve and maintain moderate conscious sedation: Versed 4 mg, Fentanyl 75 mcg, while the patient's heart rate, blood pressure, and oxygen saturation were continuously monitored. The period of conscious sedation was 47 minutes, of which I was present face-to-face 100% of this time.  Complications  Complications documented before study signed (08/23/2017 2:30 PM EDT)   No complications were associated with this study.  Documented by [name], MD - 08/23/2017 2:25 PM EDT  Coronary Findings  Diagnostic  Dominance: Right  Left Anterior Descending  Mid LAD lesion 10% stenosed  Mid LAD lesion is 10% stenosed.  Intervention  No interventions have been documented.  Right Heart  Right Heart Pressures Ao sat 99%, PA sat 79%, mean PA 17 mm Hg; mean PCWP 10 mm Hg; CO 6.8 L/min; CI 3.5  Wall Motion        Resting         All segments of the heart are normal.        Left Heart  Left Ventricle The left ventricular size is normal. The left ventricular systolic function is normal. LV end diastolic pressure is normal. The left ventricular ejection fraction is 55-65% by visual estimate. No regional wall motion abnormalities.  Aortic Valve There is severe aortic valve stenosis. The aortic valve is calcified.  Coronary Diagrams  Diagnostic Diagram     Implants     No implant documentation for this case.  MERGE Images  Link to Procedure Log   Show images for CARDIAC CATHETERIZATION Procedure Log  Hemo Data   Most Recent Value  Fick Cardiac Output 6.89 L/min  Fick Cardiac Output Index 3.52 (L/min)/BSA  Aortic Mean Gradient 41.2 mmHg  Aortic Peak Gradient 48 mmHg  Aortic Valve Area 0.96  Aortic Value Area Index 0.49 cm2/BSA  RA A Wave 11 mmHg  RA V Wave 6 mmHg  RA Mean 5 mmHg  RV Systolic Pressure 30 mmHg  RV Diastolic Pressure 4 mmHg  RV EDP 7 mmHg  PA Systolic Pressure 26 mmHg  PA Diastolic Pressure 8 mmHg  PA Mean 17 mmHg    PW A Wave 15 mmHg  PW V Wave 14 mmHg  PW Mean 10 mmHg  AO Systolic Pressure 450 mmHg  AO Diastolic Pressure 55 mmHg  AO Mean 74 mmHg  LV Systolic Pressure 388 mmHg  LV Diastolic Pressure 6 mmHg  LV EDP 15 mmHg  Arterial Occlusion Pressure Extended Systolic Pressure 99 mmHg  Arterial Occlusion Pressure Extended Diastolic Pressure 59 mmHg  Arterial Occlusion Pressure Extended Mean Pressure 77 mmHg  Left Ventricular Apex Extended Systolic Pressure 828 mmHg  Left Ventricular Apex Extended Diastolic Pressure 6 mmHg  Left Ventricular Apex Extended EDP Pressure 12 mmHg  QP/QS 1  TPVR Index 4.84 HRUI  TSVR Index 22.74 HRUI  PVR SVR Ratio 0.09  TPVR/TSVR Ratio 0.21  Impression:  I think this patient probably has stage D, severe, symptomatic aortic stenosis with exertional fatigue and shortness of breath with moderate activity levels. She says that she feels fine but her son and daughter both feel like she is having more symptoms and she is admitting to. I suspect that she has probably decreased her activity level to accommodate the symptoms. I have personally reviewed her echocardiogram and cardiac catheterization. Her echocardiogram shows a trileaflet aortic valve with severe calcification and thickening of the valve leaflets with a mean gradient of 41 mmHg consistent with severe aortic stenosis. The gradient was 43 mmHg in 07/2015. She also has moderate aortic insufficiency. Her mitral valve annulus is calcified and there is calcium extending down beneath the anterior mitral leaflet and the left ventricular outflow tract. Left ventricular systolic function is preserved. Cardiac catheterization shows no significant coronary disease and normal right heart filling pressures. I think aortic valve replacement is indicated in this patient to prevent progressive left ventricular deterioration. Although she has NASH and chronic thrombocytopenia I think she would still be at low risk for open surgical aortic  valve replacement. Given her age of 3 and the calcification extending into the mitral valve and into the left ventricular outflow tract I do not think transcatheter aortic valve replacement would be a good option for her. With her liver dysfunction I would recommend using a bioprosthetic valve to avoid the need for chronic anticoagulation with Coumadin.  She is in agreement with that.  She recently saw her hematologist who felt that she could safely undergo aortic valve replacement. I discussed the operative procedure with the patient and family including alternatives, benefits and risks; including but not limited to bleeding, blood transfusion, infection, stroke, myocardial infarction, graft failure, heart block requiring a permanent pacemaker, organ dysfunction, and death. Whitney [name] understands and agrees to proceed.   Plan:   Aortic valve replacement using a bioprosthetic valve.  Gaye Pollack, MD  [facility]  [phone]

## 2017-12-04 NOTE — Progress Notes (Signed)
prescription called in at [facility] [phone]

## 2017-12-05 ENCOUNTER — Encounter (HOSPITAL_COMMUNITY)
Admission: RE | Disposition: A | Discharge status: Home / Self Care | Payer: Self-pay | Source: Home / Self Care | Attending: Surgery

## 2017-12-05 ENCOUNTER — Inpatient Hospital Stay (HOSPITAL_COMMUNITY): Payer: Medicare Other

## 2017-12-05 ENCOUNTER — Inpatient Hospital Stay (HOSPITAL_COMMUNITY): Payer: Medicare Other | Admitting: Certified Registered Nurse Anesthetist

## 2017-12-05 ENCOUNTER — Inpatient Hospital Stay (HOSPITAL_COMMUNITY)
Admission: RE | Admit: 2017-12-05 | Discharge: 2017-12-12 | DRG: 220 | Disposition: A | Discharge status: Home / Self Care | Payer: Medicare Other | Attending: Surgery | Admitting: Surgery

## 2017-12-05 ENCOUNTER — Encounter (HOSPITAL_COMMUNITY): Payer: Self-pay | Admitting: Anesthesiology

## 2017-12-05 DIAGNOSIS — Z79899 Other long term (current) drug therapy: Secondary | ICD-10-CM

## 2017-12-05 DIAGNOSIS — K219 Gastro-esophageal reflux disease without esophagitis: Secondary | ICD-10-CM | POA: Diagnosis present

## 2017-12-05 DIAGNOSIS — Z8701 Personal history of pneumonia (recurrent): Secondary | ICD-10-CM | POA: Diagnosis not present

## 2017-12-05 DIAGNOSIS — J9811 Atelectasis: Secondary | ICD-10-CM | POA: Diagnosis not present

## 2017-12-05 DIAGNOSIS — K7581 Nonalcoholic steatohepatitis (NASH): Secondary | ICD-10-CM | POA: Diagnosis present

## 2017-12-05 DIAGNOSIS — Z888 Allergy status to other drugs, medicaments and biological substances status: Secondary | ICD-10-CM | POA: Diagnosis not present

## 2017-12-05 DIAGNOSIS — M797 Fibromyalgia: Secondary | ICD-10-CM | POA: Diagnosis present

## 2017-12-05 DIAGNOSIS — Z8249 Family history of ischemic heart disease and other diseases of the circulatory system: Secondary | ICD-10-CM | POA: Diagnosis not present

## 2017-12-05 DIAGNOSIS — D62 Acute posthemorrhagic anemia: Secondary | ICD-10-CM | POA: Diagnosis not present

## 2017-12-05 DIAGNOSIS — I352 Nonrheumatic aortic (valve) stenosis with insufficiency: Principal | ICD-10-CM | POA: Diagnosis present

## 2017-12-05 DIAGNOSIS — Z87442 Personal history of urinary calculi: Secondary | ICD-10-CM | POA: Diagnosis not present

## 2017-12-05 DIAGNOSIS — G4733 Obstructive sleep apnea (adult) (pediatric): Secondary | ICD-10-CM | POA: Diagnosis present

## 2017-12-05 DIAGNOSIS — E119 Type 2 diabetes mellitus without complications: Secondary | ICD-10-CM | POA: Diagnosis present

## 2017-12-05 DIAGNOSIS — I350 Nonrheumatic aortic (valve) stenosis: Secondary | ICD-10-CM

## 2017-12-05 DIAGNOSIS — R161 Splenomegaly, not elsewhere classified: Secondary | ICD-10-CM | POA: Diagnosis present

## 2017-12-05 DIAGNOSIS — K5900 Constipation, unspecified: Secondary | ICD-10-CM | POA: Diagnosis not present

## 2017-12-05 DIAGNOSIS — M1990 Unspecified osteoarthritis, unspecified site: Secondary | ICD-10-CM | POA: Diagnosis present

## 2017-12-05 DIAGNOSIS — M3500 Sicca syndrome, unspecified: Secondary | ICD-10-CM | POA: Diagnosis present

## 2017-12-05 DIAGNOSIS — Z952 Presence of prosthetic heart valve: Secondary | ICD-10-CM

## 2017-12-05 DIAGNOSIS — E8770 Fluid overload, unspecified: Secondary | ICD-10-CM | POA: Diagnosis not present

## 2017-12-05 DIAGNOSIS — Z8261 Family history of arthritis: Secondary | ICD-10-CM | POA: Diagnosis not present

## 2017-12-05 DIAGNOSIS — Z881 Allergy status to other antibiotic agents status: Secondary | ICD-10-CM

## 2017-12-05 DIAGNOSIS — D6959 Other secondary thrombocytopenia: Secondary | ICD-10-CM | POA: Diagnosis present

## 2017-12-05 DIAGNOSIS — I4891 Unspecified atrial fibrillation: Secondary | ICD-10-CM | POA: Diagnosis not present

## 2017-12-05 DIAGNOSIS — K766 Portal hypertension: Secondary | ICD-10-CM | POA: Diagnosis present

## 2017-12-05 DIAGNOSIS — E785 Hyperlipidemia, unspecified: Secondary | ICD-10-CM | POA: Diagnosis present

## 2017-12-05 DIAGNOSIS — J90 Pleural effusion, not elsewhere classified: Secondary | ICD-10-CM

## 2017-12-05 HISTORY — PX: AORTIC VALVE REPLACEMENT: SHX41

## 2017-12-05 HISTORY — PX: TEE WITHOUT CARDIOVERSION: SHX5443

## 2017-12-05 LAB — GLUCOSE, CAPILLARY
GLUCOSE-CAPILLARY: 117 mg/dL — AB (ref 70–99)
GLUCOSE-CAPILLARY: 125 mg/dL — AB (ref 70–99)
GLUCOSE-CAPILLARY: 126 mg/dL — AB (ref 70–99)
GLUCOSE-CAPILLARY: 112 mg/dL — AB (ref 70–99)
Glucose-Capillary: 90 mg/dL (ref 70–99)
Glucose-Capillary: 129 mg/dL — ABNORMAL HIGH (ref 70–99)
Glucose-Capillary: 121 mg/dL — ABNORMAL HIGH (ref 70–99)
Glucose-Capillary: 122 mg/dL — ABNORMAL HIGH (ref 70–99)
Glucose-Capillary: 123 mg/dL — ABNORMAL HIGH (ref 70–99)
Glucose-Capillary: 118 mg/dL — ABNORMAL HIGH (ref 70–99)
Glucose-Capillary: 120 mg/dL — ABNORMAL HIGH (ref 70–99)

## 2017-12-05 LAB — POCT I-STAT 3, ART BLOOD GAS (G3+)
Acid-Base Excess: 2 mmol/L (ref 0.0–2.0)
Acid-Base Excess: 1 mmol/L (ref 0.0–2.0)
Acid-base deficit: 2 mmol/L (ref 0.0–2.0)
BICARBONATE: 22.4 mmol/L (ref 20.0–28.0)
Bicarbonate: 28.1 mmol/L — ABNORMAL HIGH (ref 20.0–28.0)
Bicarbonate: 25.8 mmol/L (ref 20.0–28.0)
O2 SAT: 100 %
O2 SAT: 100 %
O2 Saturation: 100 %
PCO2 ART: 49.1 mmHg — AB (ref 32.0–48.0)
PCO2 ART: 35.3 mmHg (ref 32.0–48.0)
PH ART: 7.366 (ref 7.350–7.450)
PO2 ART: 500 mmHg — AB (ref 83.0–108.0)
PO2 ART: 335 mmHg — AB (ref 83.0–108.0)
TCO2: 30 mmol/L (ref 22–32)
TCO2: 23 mmol/L (ref 22–32)
TCO2: 27 mmol/L (ref 22–32)
pCO2 arterial: 38.9 mmHg (ref 32.0–48.0)
pH, Arterial: 7.41 (ref 7.350–7.450)
pH, Arterial: 7.43 (ref 7.350–7.450)
pO2, Arterial: 346 mmHg — ABNORMAL HIGH (ref 83.0–108.0)

## 2017-12-05 LAB — ECHO INTRAOPERATIVE TEE

## 2017-12-05 LAB — POCT I-STAT, CHEM 8
BUN: 14 mg/dL (ref 8–23)
BUN: 11 mg/dL (ref 8–23)
BUN: 13 mg/dL (ref 8–23)
BUN: 11 mg/dL (ref 8–23)
BUN: 12 mg/dL (ref 8–23)
CALCIUM ION: 1.2 mmol/L (ref 1.15–1.40)
CALCIUM ION: 1.09 mmol/L — AB (ref 1.15–1.40)
CHLORIDE: 105 mmol/L (ref 98–111)
CHLORIDE: 103 mmol/L (ref 98–111)
CHLORIDE: 105 mmol/L (ref 98–111)
CHLORIDE: 103 mmol/L (ref 98–111)
Calcium, Ion: 0.95 mmol/L — ABNORMAL LOW (ref 1.15–1.40)
Calcium, Ion: 1.11 mmol/L — ABNORMAL LOW (ref 1.15–1.40)
Calcium, Ion: 1.09 mmol/L — ABNORMAL LOW (ref 1.15–1.40)
Chloride: 104 mmol/L (ref 98–111)
Creatinine, Ser: 0.4 mg/dL — ABNORMAL LOW (ref 0.44–1.00)
Creatinine, Ser: 0.3 mg/dL — ABNORMAL LOW (ref 0.44–1.00)
Creatinine, Ser: 0.5 mg/dL (ref 0.44–1.00)
Creatinine, Ser: 0.4 mg/dL — ABNORMAL LOW (ref 0.44–1.00)
Creatinine, Ser: 0.4 mg/dL — ABNORMAL LOW (ref 0.44–1.00)
GLUCOSE: 126 mg/dL — AB (ref 70–99)
Glucose, Bld: 93 mg/dL (ref 70–99)
Glucose, Bld: 94 mg/dL (ref 70–99)
Glucose, Bld: 94 mg/dL (ref 70–99)
Glucose, Bld: 161 mg/dL — ABNORMAL HIGH (ref 70–99)
HCT: 31 % — ABNORMAL LOW (ref 36.0–46.0)
HCT: 25 % — ABNORMAL LOW (ref 36.0–46.0)
HEMATOCRIT: 32 % — AB (ref 36.0–46.0)
HEMATOCRIT: 26 % — AB (ref 36.0–46.0)
HEMATOCRIT: 25 % — AB (ref 36.0–46.0)
Hemoglobin: 10.9 g/dL — ABNORMAL LOW (ref 12.0–15.0)
Hemoglobin: 10.5 g/dL — ABNORMAL LOW (ref 12.0–15.0)
Hemoglobin: 8.8 g/dL — ABNORMAL LOW (ref 12.0–15.0)
Hemoglobin: 8.5 g/dL — ABNORMAL LOW (ref 12.0–15.0)
Hemoglobin: 8.5 g/dL — ABNORMAL LOW (ref 12.0–15.0)
POTASSIUM: 3.7 mmol/L (ref 3.5–5.1)
POTASSIUM: 3.8 mmol/L (ref 3.5–5.1)
Potassium: 3.6 mmol/L (ref 3.5–5.1)
Potassium: 4.3 mmol/L (ref 3.5–5.1)
Potassium: 4.5 mmol/L (ref 3.5–5.1)
SODIUM: 142 mmol/L (ref 135–145)
SODIUM: 142 mmol/L (ref 135–145)
SODIUM: 142 mmol/L (ref 135–145)
SODIUM: 138 mmol/L (ref 135–145)
Sodium: 140 mmol/L (ref 135–145)
TCO2: 28 mmol/L (ref 22–32)
TCO2: 26 mmol/L (ref 22–32)
TCO2: 27 mmol/L (ref 22–32)
TCO2: 27 mmol/L (ref 22–32)
TCO2: 26 mmol/L (ref 22–32)

## 2017-12-05 LAB — CREATININE, SERUM
CREATININE: 0.59 mg/dL (ref 0.44–1.00)
GFR calc non Af Amer: >60 mL/min (ref >60)

## 2017-12-05 LAB — CBC
HEMATOCRIT: 35.8 % — AB (ref 36.0–46.0)
HEMATOCRIT: 40.4 % (ref 36.0–46.0)
HEMOGLOBIN: 12.7 g/dL (ref 12.0–15.0)
Hemoglobin: 11.5 g/dL — ABNORMAL LOW (ref 12.0–15.0)
MCH: 29.3 pg (ref 26.0–34.0)
MCH: 29.1 pg (ref 26.0–34.0)
MCHC: 32.1 g/dL (ref 30.0–36.0)
MCHC: 31.4 g/dL (ref 30.0–36.0)
MCV: 91.3 fL (ref 78.0–100.0)
MCV: 92.7 fL (ref 78.0–100.0)
PLATELETS: 70 10*3/uL — AB (ref 150–400)
Platelets: 83 10*3/uL — ABNORMAL LOW (ref 150–400)
RBC: 3.92 MIL/uL (ref 3.87–5.11)
RBC: 4.36 MIL/uL (ref 3.87–5.11)
RDW: 14.6 % (ref 11.5–15.5)
RDW: 14.6 % (ref 11.5–15.5)
WBC: 5.9 10*3/uL (ref 4.0–10.5)
WBC: 5.3 10*3/uL (ref 4.0–10.5)

## 2017-12-05 LAB — PLATELET COUNT: PLATELETS: 66 10*3/uL — AB (ref 150–400)

## 2017-12-05 LAB — APTT: aPTT: 53 seconds — ABNORMAL HIGH (ref 24–36)

## 2017-12-05 LAB — HEMOGLOBIN AND HEMATOCRIT, BLOOD
HEMATOCRIT: 25.5 % — AB (ref 36.0–46.0)
HEMOGLOBIN: 8.3 g/dL — AB (ref 12.0–15.0)

## 2017-12-05 LAB — PROTIME-INR
INR: 1.71
Prothrombin Time: 20 seconds — ABNORMAL HIGH (ref 11.4–15.2)

## 2017-12-05 LAB — MAGNESIUM: MAGNESIUM: 2.9 mg/dL — AB (ref 1.7–2.4)

## 2017-12-05 SURGERY — REPLACEMENT, AORTIC VALVE, OPEN
Anesthesia: General | Site: Chest

## 2017-12-05 MED ORDER — ACETAMINOPHEN 160 MG/5ML PO SOLN: 650.0000 mg | Freq: Once | ORAL | Status: AC

## 2017-12-05 MED ORDER — PHENYLEPHRINE 40 MCG/ML (10ML) SYRINGE FOR IV PUSH (FOR BLOOD PRESSURE SUPPORT)
PREFILLED_SYRINGE | INTRAVENOUS | Status: DC | PRN
  Administered 2017-12-05: 40 ug via INTRAVENOUS

## 2017-12-05 MED ORDER — PROPOFOL 10 MG/ML IV BOLUS
INTRAVENOUS | Status: DC | PRN
  Administered 2017-12-05: 150 mg via INTRAVENOUS

## 2017-12-05 MED ORDER — ACETAMINOPHEN 160 MG/5ML PO SOLN: 1000.0000 mg | Freq: Four times a day (QID) | ORAL | Status: DC

## 2017-12-05 MED ORDER — ONDANSETRON HCL 4 MG/2ML IJ SOLN: 4.0000 mg | Freq: Four times a day (QID) | INTRAMUSCULAR | Status: DC | PRN

## 2017-12-05 MED ORDER — ROCURONIUM BROMIDE 10 MG/ML (PF) SYRINGE
PREFILLED_SYRINGE | INTRAVENOUS | Status: AC
  Filled 2017-12-05: qty 20

## 2017-12-05 MED ORDER — METOPROLOL TARTRATE 12.5 MG HALF TABLET: 12.5000 mg | ORAL_TABLET | Freq: Two times a day (BID) | ORAL | Status: DC

## 2017-12-05 MED ORDER — FAMOTIDINE IN NACL 20-0.9 MG/50ML-% IV SOLN
20.0000 mg | Freq: Two times a day (BID) | INTRAVENOUS | Status: AC
  Administered 2017-12-05 (×2): 20 mg via INTRAVENOUS
  Filled 2017-12-05: qty 50

## 2017-12-05 MED ORDER — POTASSIUM CHLORIDE 10 MEQ/50ML IV SOLN
10.0000 meq | INTRAVENOUS | Status: AC
  Administered 2017-12-05 (×3): 10 meq via INTRAVENOUS

## 2017-12-05 MED ORDER — METOPROLOL TARTRATE 12.5 MG HALF TABLET
12.5000 mg | ORAL_TABLET | Freq: Once | ORAL | Status: AC
  Administered 2017-12-05: 12.5 mg via ORAL
  Filled 2017-12-05: qty 1

## 2017-12-05 MED ORDER — CHLORHEXIDINE GLUCONATE 0.12 % MT SOLN
15.0000 mL | OROMUCOSAL | Status: AC
  Administered 2017-12-05: 15 mL via OROMUCOSAL

## 2017-12-05 MED ORDER — CHLORHEXIDINE GLUCONATE CLOTH 2 % EX PADS
6.0000 | MEDICATED_PAD | Freq: Every day | CUTANEOUS | Status: DC
  Administered 2017-12-05 – 2017-12-07 (×3): 6 via TOPICAL

## 2017-12-05 MED ORDER — 0.9 % SODIUM CHLORIDE (POUR BTL) OPTIME
TOPICAL | Status: DC | PRN
  Administered 2017-12-05: 1000 mL

## 2017-12-05 MED ORDER — TRAMADOL HCL 50 MG PO TABS
50.0000 mg | ORAL_TABLET | ORAL | Status: DC | PRN
  Administered 2017-12-05: 50 mg via ORAL
  Administered 2017-12-06: 100 mg via ORAL
  Administered 2017-12-06: 50 mg via ORAL
  Filled 2017-12-05: qty 2
  Filled 2017-12-05 (×2): qty 1

## 2017-12-05 MED ORDER — DOCUSATE SODIUM 100 MG PO CAPS
200.0000 mg | ORAL_CAPSULE | Freq: Every day | ORAL | Status: DC
  Administered 2017-12-06 – 2017-12-08 (×3): 200 mg via ORAL
  Filled 2017-12-05 (×3): qty 2

## 2017-12-05 MED ORDER — DIPHENHYDRAMINE HCL 50 MG/ML IJ SOLN
INTRAMUSCULAR | Status: AC
  Filled 2017-12-05: qty 1

## 2017-12-05 MED ORDER — HEPARIN SODIUM (PORCINE) 1000 UNIT/ML IJ SOLN
INTRAMUSCULAR | Status: AC
  Filled 2017-12-05: qty 1

## 2017-12-05 MED ORDER — METOPROLOL TARTRATE 5 MG/5ML IV SOLN: 2.5000 mg | INTRAVENOUS | Status: DC | PRN

## 2017-12-05 MED ORDER — ROCURONIUM BROMIDE 10 MG/ML (PF) SYRINGE
PREFILLED_SYRINGE | INTRAVENOUS | Status: DC | PRN
  Administered 2017-12-05: 20 mg via INTRAVENOUS
  Administered 2017-12-05: 80 mg via INTRAVENOUS
  Administered 2017-12-05: 50 mg via INTRAVENOUS

## 2017-12-05 MED ORDER — THROMBIN 5000 UNITS EX SOLR
CUTANEOUS | Status: DC | PRN
  Administered 2017-12-05: 5000 [IU] via TOPICAL

## 2017-12-05 MED ORDER — INSULIN REGULAR BOLUS VIA INFUSION
0.0000 [IU] | Freq: Three times a day (TID) | INTRAVENOUS | Status: DC
  Filled 2017-12-05: qty 10

## 2017-12-05 MED ORDER — MIDAZOLAM HCL 2 MG/2ML IJ SOLN: 2.0000 mg | INTRAMUSCULAR | Status: DC | PRN

## 2017-12-05 MED ORDER — PHENYLEPHRINE HCL-NACL 20-0.9 MG/250ML-% IV SOLN
0.0000 ug/min | INTRAVENOUS | Status: DC
  Filled 2017-12-05: qty 250

## 2017-12-05 MED ORDER — BISACODYL 10 MG RE SUPP: 10.0000 mg | Freq: Every day | RECTAL | Status: DC

## 2017-12-05 MED ORDER — MORPHINE SULFATE (PF) 2 MG/ML IV SOLN: 1.0000 mg | INTRAVENOUS | Status: AC | PRN

## 2017-12-05 MED ORDER — SODIUM CHLORIDE 0.9% FLUSH: 3.0000 mL | Freq: Two times a day (BID) | INTRAVENOUS | Status: DC

## 2017-12-05 MED ORDER — HEPARIN SODIUM (PORCINE) 1000 UNIT/ML IJ SOLN
INTRAMUSCULAR | Status: DC | PRN
  Administered 2017-12-05: 34000 [IU] via INTRAVENOUS

## 2017-12-05 MED ORDER — SODIUM CHLORIDE 0.45 % IV SOLN
INTRAVENOUS | Status: DC | PRN
  Administered 2017-12-05: 13:00:00 via INTRAVENOUS

## 2017-12-05 MED ORDER — LACTATED RINGERS IV SOLN
INTRAVENOUS | Status: DC
  Administered 2017-12-05: 13:00:00 via INTRAVENOUS

## 2017-12-05 MED ORDER — CHLORHEXIDINE GLUCONATE 4 % EX LIQD: 30.0000 mL | CUTANEOUS | Status: DC

## 2017-12-05 MED ORDER — PROPOFOL 10 MG/ML IV BOLUS
INTRAVENOUS | Status: AC
  Filled 2017-12-05: qty 20

## 2017-12-05 MED ORDER — DEXAMETHASONE SODIUM PHOSPHATE 10 MG/ML IJ SOLN
INTRAMUSCULAR | Status: AC
  Filled 2017-12-05: qty 1

## 2017-12-05 MED ORDER — SODIUM CHLORIDE 0.9% FLUSH
10.0000 mL | Freq: Two times a day (BID) | INTRAVENOUS | Status: DC
  Administered 2017-12-05 – 2017-12-08 (×6): 10 mL

## 2017-12-05 MED ORDER — LACTATED RINGERS IV SOLN
INTRAVENOUS | Status: DC | PRN
  Administered 2017-12-05 (×4): via INTRAVENOUS

## 2017-12-05 MED ORDER — DEXMEDETOMIDINE HCL IN NACL 400 MCG/100ML IV SOLN
0.0000 ug/kg/h | INTRAVENOUS | Status: DC
  Administered 2017-12-05: 0.5 ug/kg/h via INTRAVENOUS
  Filled 2017-12-05: qty 100

## 2017-12-05 MED ORDER — ALBUMIN HUMAN 5 % IV SOLN
INTRAVENOUS | Status: DC | PRN
  Administered 2017-12-05: 11:00:00 via INTRAVENOUS

## 2017-12-05 MED ORDER — FENTANYL CITRATE (PF) 250 MCG/5ML IJ SOLN
INTRAMUSCULAR | Status: AC
Start: 2017-12-05 — End: ?
  Filled 2017-12-05: qty 25

## 2017-12-05 MED ORDER — FENTANYL CITRATE (PF) 250 MCG/5ML IJ SOLN
INTRAMUSCULAR | Status: DC | PRN
  Administered 2017-12-05: 250 ug via INTRAVENOUS
  Administered 2017-12-05: 50 ug via INTRAVENOUS
  Administered 2017-12-05 (×2): 100 ug via INTRAVENOUS
  Administered 2017-12-05: 250 ug via INTRAVENOUS
  Administered 2017-12-05 (×2): 150 ug via INTRAVENOUS
  Administered 2017-12-05: 250 ug via INTRAVENOUS

## 2017-12-05 MED ORDER — ORAL CARE MOUTH RINSE
15.0000 mL | OROMUCOSAL | Status: DC
  Administered 2017-12-05 (×2): 15 mL via OROMUCOSAL

## 2017-12-05 MED ORDER — SODIUM CHLORIDE 0.9 % IV SOLN
1.5000 g | Freq: Two times a day (BID) | INTRAVENOUS | Status: AC
  Administered 2017-12-05 – 2017-12-07 (×4): 1.5 g via INTRAVENOUS
  Filled 2017-12-05 (×4): qty 1.5

## 2017-12-05 MED ORDER — OXYCODONE HCL 5 MG PO TABS
5.0000 mg | ORAL_TABLET | ORAL | Status: DC | PRN
  Administered 2017-12-07 – 2017-12-08 (×4): 10 mg via ORAL
  Filled 2017-12-05 (×4): qty 2

## 2017-12-05 MED ORDER — SODIUM CHLORIDE 0.9 % IV SOLN: INTRAVENOUS | Status: DC

## 2017-12-05 MED ORDER — NITROGLYCERIN IN D5W 200-5 MCG/ML-% IV SOLN: 0.0000 ug/min | INTRAVENOUS | Status: DC

## 2017-12-05 MED ORDER — EZETIMIBE 10 MG PO TABS
10.0000 mg | ORAL_TABLET | Freq: Every day | ORAL | Status: DC
  Administered 2017-12-09 – 2017-12-11 (×3): 10 mg via ORAL
  Filled 2017-12-05 (×3): qty 1

## 2017-12-05 MED ORDER — FENTANYL CITRATE (PF) 250 MCG/5ML IJ SOLN
INTRAMUSCULAR | Status: AC
  Filled 2017-12-05: qty 5

## 2017-12-05 MED ORDER — ACETAMINOPHEN 500 MG PO TABS
1000.0000 mg | ORAL_TABLET | Freq: Four times a day (QID) | ORAL | Status: DC
  Administered 2017-12-06 – 2017-12-07 (×5): 1000 mg via ORAL
  Filled 2017-12-05 (×5): qty 2

## 2017-12-05 MED ORDER — LACTATED RINGERS IV SOLN: INTRAVENOUS | Status: DC

## 2017-12-05 MED ORDER — MIDAZOLAM HCL 10 MG/2ML IJ SOLN
INTRAMUSCULAR | Status: AC
  Filled 2017-12-05: qty 2

## 2017-12-05 MED ORDER — LACTATED RINGERS IV SOLN: 500.0000 mL | Freq: Once | INTRAVENOUS | Status: DC | PRN

## 2017-12-05 MED ORDER — PROTAMINE SULFATE 10 MG/ML IV SOLN
INTRAVENOUS | Status: DC | PRN
  Administered 2017-12-05: 340 mg via INTRAVENOUS

## 2017-12-05 MED ORDER — DEXAMETHASONE SODIUM PHOSPHATE 10 MG/ML IJ SOLN
INTRAMUSCULAR | Status: DC | PRN
  Administered 2017-12-05: 10 mg via INTRAVENOUS

## 2017-12-05 MED ORDER — METOPROLOL TARTRATE 25 MG/10 ML ORAL SUSPENSION: 12.5000 mg | Freq: Two times a day (BID) | ORAL | Status: DC

## 2017-12-05 MED ORDER — CHLORHEXIDINE GLUCONATE 0.12 % MT SOLN
15.0000 mL | Freq: Once | OROMUCOSAL | Status: AC
  Administered 2017-12-05: 15 mL via OROMUCOSAL
  Filled 2017-12-05: qty 15

## 2017-12-05 MED ORDER — CHLORHEXIDINE GLUCONATE 0.12% ORAL RINSE (MEDLINE KIT): 15.0000 mL | Freq: Two times a day (BID) | OROMUCOSAL | Status: DC

## 2017-12-05 MED ORDER — MAGNESIUM SULFATE 4 GM/100ML IV SOLN
4.0000 g | Freq: Once | INTRAVENOUS | Status: AC
  Administered 2017-12-05: 4 g via INTRAVENOUS
  Filled 2017-12-05: qty 100

## 2017-12-05 MED ORDER — DEXMEDETOMIDINE HCL IN NACL 200 MCG/50ML IV SOLN
0.0000 ug/kg/h | INTRAVENOUS | Status: DC
  Filled 2017-12-05: qty 50

## 2017-12-05 MED ORDER — VANCOMYCIN HCL IN DEXTROSE 1-5 GM/200ML-% IV SOLN
1000.0000 mg | Freq: Once | INTRAVENOUS | Status: DC
  Filled 2017-12-05: qty 200

## 2017-12-05 MED ORDER — THROMBIN 5000 UNITS EX SOLR
CUTANEOUS | Status: AC
  Filled 2017-12-05: qty 20000

## 2017-12-05 MED ORDER — MORPHINE SULFATE (PF) 2 MG/ML IV SOLN
2.0000 mg | INTRAVENOUS | Status: DC | PRN
  Administered 2017-12-06 (×2): 2 mg via INTRAVENOUS
  Filled 2017-12-05 (×3): qty 1

## 2017-12-05 MED ORDER — SODIUM CHLORIDE 0.9% FLUSH: 10.0000 mL | INTRAVENOUS | Status: DC | PRN

## 2017-12-05 MED ORDER — PROTAMINE SULFATE 10 MG/ML IV SOLN
INTRAVENOUS | Status: AC
  Filled 2017-12-05: qty 25

## 2017-12-05 MED ORDER — MIDAZOLAM HCL 2 MG/2ML IJ SOLN
INTRAMUSCULAR | Status: DC | PRN
  Administered 2017-12-05: 4 mg via INTRAVENOUS
  Administered 2017-12-05: 1 mg via INTRAVENOUS
  Administered 2017-12-05: 2 mg via INTRAVENOUS

## 2017-12-05 MED ORDER — BISACODYL 5 MG PO TBEC
10.0000 mg | DELAYED_RELEASE_TABLET | Freq: Every day | ORAL | Status: DC
  Administered 2017-12-06 – 2017-12-08 (×3): 10 mg via ORAL
  Filled 2017-12-05 (×3): qty 2

## 2017-12-05 MED ORDER — HEMOSTATIC AGENTS (NO CHARGE) OPTIME
TOPICAL | Status: DC | PRN
  Administered 2017-12-05 (×3): 1 via TOPICAL

## 2017-12-05 MED ORDER — PROTAMINE SULFATE 10 MG/ML IV SOLN
INTRAVENOUS | Status: AC
  Filled 2017-12-05: qty 10

## 2017-12-05 MED ORDER — ALBUMIN HUMAN 5 % IV SOLN
250.0000 mL | INTRAVENOUS | Status: AC | PRN
Start: 2017-12-05 — End: 2017-12-06
  Administered 2017-12-05: 250 mL via INTRAVENOUS

## 2017-12-05 MED ORDER — DIPHENHYDRAMINE HCL 50 MG/ML IJ SOLN
INTRAMUSCULAR | Status: DC | PRN
  Administered 2017-12-05: 50 mg via INTRAVENOUS

## 2017-12-05 MED ORDER — PANTOPRAZOLE SODIUM 40 MG PO TBEC
40.0000 mg | DELAYED_RELEASE_TABLET | Freq: Every day | ORAL | Status: DC
  Administered 2017-12-07 – 2017-12-08 (×2): 40 mg via ORAL
  Filled 2017-12-05 (×2): qty 1

## 2017-12-05 MED ORDER — SODIUM CHLORIDE 0.9 % IV SOLN
INTRAVENOUS | Status: DC
  Filled 2017-12-05: qty 1

## 2017-12-05 MED ORDER — SODIUM CHLORIDE 0.9% FLUSH: 3.0000 mL | INTRAVENOUS | Status: DC | PRN

## 2017-12-05 MED ORDER — ACETAMINOPHEN 650 MG RE SUPP
650.0000 mg | Freq: Once | RECTAL | Status: AC
  Administered 2017-12-05: 650 mg via RECTAL

## 2017-12-05 MED ORDER — SODIUM CHLORIDE 0.9 % IV SOLN
250.0000 mL | INTRAVENOUS | Status: DC
  Administered 2017-12-06: 250 mL via INTRAVENOUS

## 2017-12-05 MED ORDER — PHENYLEPHRINE 40 MCG/ML (10ML) SYRINGE FOR IV PUSH (FOR BLOOD PRESSURE SUPPORT)
PREFILLED_SYRINGE | INTRAVENOUS | Status: AC
  Filled 2017-12-05: qty 10

## 2017-12-05 MED ORDER — SODIUM CHLORIDE 0.9 % IV SOLN
INTRAVENOUS | Status: DC | PRN
  Administered 2017-12-05: 10:00:00 via INTRAVENOUS

## 2017-12-05 SURGICAL SUPPLY — 68 items
ADAPTER CARDIO PERF ANTE/RETRO (ADAPTER) ×4 IMPLANT
ADPR PRFSN 84XANTGRD RTRGD (ADAPTER) ×2
BAG DECANTER FOR FLEXI CONT (MISCELLANEOUS) ×4 IMPLANT
BLADE STERNUM SYSTEM 6 (BLADE) ×4 IMPLANT
BLADE SURG 15 STRL LF DISP TIS (BLADE) ×2 IMPLANT
BLADE SURG 15 STRL SS (BLADE) ×4
CANISTER SUCT 3000ML PPV (MISCELLANEOUS) ×4 IMPLANT
CANNULA GUNDRY RCSP 15FR (MISCELLANEOUS) ×4 IMPLANT
CATH HEART VENT LEFT (CATHETERS) ×2 IMPLANT
CATH ROBINSON RED A/P 18FR (CATHETERS) ×12 IMPLANT
CATH THORACIC 36FR (CATHETERS) ×4 IMPLANT
CATH THORACIC 36FR RT ANG (CATHETERS) ×4 IMPLANT
CONT SPEC 4OZ CLIKSEAL STRL BL (MISCELLANEOUS) ×4 IMPLANT
COVER SURGICAL LIGHT HANDLE (MISCELLANEOUS) ×4 IMPLANT
CRADLE DONUT ADULT HEAD (MISCELLANEOUS) ×4 IMPLANT
DRAPE SLUSH/WARMER DISC (DRAPES) ×4 IMPLANT
DRSG AQUACEL AG ADV 3.5X14 (GAUZE/BANDAGES/DRESSINGS) ×4 IMPLANT
DRSG COVADERM 4X14 (GAUZE/BANDAGES/DRESSINGS) ×4 IMPLANT
ELECT CAUTERY BLADE 6.4 (BLADE) ×4 IMPLANT
ELECT REM PT RETURN 9FT ADLT (ELECTROSURGICAL) ×8
ELECTRODE REM PT RTRN 9FT ADLT (ELECTROSURGICAL) ×4 IMPLANT
FELT TEFLON 1X6 (MISCELLANEOUS) ×8 IMPLANT
GAUZE SPONGE 4X4 12PLY STRL (GAUZE/BANDAGES/DRESSINGS) ×4 IMPLANT
GAUZE SPONGE 4X4 12PLY STRL LF (GAUZE/BANDAGES/DRESSINGS) ×3 IMPLANT
GLOVE BIO SURGEON STRL SZ 6 (GLOVE) IMPLANT
GLOVE BIO SURGEON STRL SZ 6.5 (GLOVE) IMPLANT
GLOVE BIO SURGEON STRL SZ7 (GLOVE) ×12 IMPLANT
GLOVE BIO SURGEON STRL SZ7.5 (GLOVE) IMPLANT
GLOVE BIO SURGEONS STRL SZ 6.5 (GLOVE)
GLOVE BIOGEL PI IND STRL 6.5 (GLOVE) ×10 IMPLANT
GLOVE BIOGEL PI INDICATOR 6.5 (GLOVE) ×10
GLOVE EUDERMIC 7 POWDERFREE (GLOVE) ×8 IMPLANT
GOWN STRL REUS W/ TWL LRG LVL3 (GOWN DISPOSABLE) ×8 IMPLANT
GOWN STRL REUS W/ TWL LRG LVL4 (GOWN DISPOSABLE) ×10 IMPLANT
GOWN STRL REUS W/ TWL XL LVL3 (GOWN DISPOSABLE) ×2 IMPLANT
GOWN STRL REUS W/TWL LRG LVL3 (GOWN DISPOSABLE) ×16
GOWN STRL REUS W/TWL LRG LVL4 (GOWN DISPOSABLE) ×20
GOWN STRL REUS W/TWL XL LVL3 (GOWN DISPOSABLE) ×4
HEMOSTAT POWDER SURGIFOAM 1G (HEMOSTASIS) ×12 IMPLANT
HEMOSTAT SURGICEL 2X14 (HEMOSTASIS) ×4 IMPLANT
KIT BASIN OR (CUSTOM PROCEDURE TRAY) ×4 IMPLANT
KIT CATH CPB BARTLE (MISCELLANEOUS) ×4 IMPLANT
KIT SUCTION CATH 14FR (SUCTIONS) ×4 IMPLANT
KIT TURNOVER KIT B (KITS) ×4 IMPLANT
LINE VENT (MISCELLANEOUS) ×4 IMPLANT
NS IRRIG 1000ML POUR BTL (IV SOLUTION) ×24 IMPLANT
PACK E OPEN HEART (SUTURE) ×4 IMPLANT
PACK OPEN HEART (CUSTOM PROCEDURE TRAY) ×4 IMPLANT
PAD ARMBOARD 7.5X6 YLW CONV (MISCELLANEOUS) ×8 IMPLANT
SET CARDIOPLEGIA MPS 5001102 (MISCELLANEOUS) ×4 IMPLANT
SUT BONE WAX W31G (SUTURE) ×4 IMPLANT
SUT ETHIBON 2 0 V 52N 30 (SUTURE) ×8 IMPLANT
SUT PROLENE 3 0 SH DA (SUTURE) IMPLANT
SUT PROLENE 3 0 SH1 36 (SUTURE) ×4 IMPLANT
SUT PROLENE 4 0 RB 1 (SUTURE) ×12
SUT PROLENE 4-0 RB1 .5 CRCL 36 (SUTURE) ×6 IMPLANT
SUT STEEL 6MS V (SUTURE) IMPLANT
SUT VIC AB 1 CTX 36 (SUTURE) ×12
SUT VIC AB 1 CTX36XBRD ANBCTR (SUTURE) ×6 IMPLANT
SYSTEM SAHARA CHEST DRAIN ATS (WOUND CARE) ×4 IMPLANT
TAPE PAPER 3X10 WHT MICROPORE (GAUZE/BANDAGES/DRESSINGS) ×4 IMPLANT
TOWEL GREEN STERILE (TOWEL DISPOSABLE) ×4 IMPLANT
TOWEL GREEN STERILE FF (TOWEL DISPOSABLE) ×4 IMPLANT
TRAY FOLEY SLVR 16FR TEMP STAT (SET/KITS/TRAYS/PACK) ×4 IMPLANT
UNDERPAD 30X30 (UNDERPADS AND DIAPERS) ×4 IMPLANT
VALVE AORTIC SZ21 INSP/RESIL (Valve) ×4 IMPLANT
VENT LEFT HEART 12002 (CATHETERS) ×4
WATER STERILE IRR 1000ML POUR (IV SOLUTION) ×8 IMPLANT

## 2017-12-05 NOTE — Progress Notes (Signed)
*  PRELIMINARY RESULTS* Echocardiogram Echocardiogram Transesophageal has been performed.  Whitney [name] 12/05/2017, 8:09 AM

## 2017-12-05 NOTE — Anesthesia Procedure Notes (Addendum)
Procedure Name: Intubation Date/Time: 12/05/2017 8:04 AM Performed by: Valda Favia, CRNA Pre-anesthesia Checklist: Patient identified, Emergency Drugs available, Suction available and Patient being monitored Patient Re-evaluated:Patient Re-evaluated prior to induction Oxygen Delivery Method: Circle System Utilized Preoxygenation: Pre-oxygenation with 100% oxygen Induction Type: IV induction Ventilation: Mask ventilation without difficulty Laryngoscope Size: Mac and 4 Grade View: Grade I Tube type: Oral Tube size: 8.0 mm Number of attempts: 1 Airway Equipment and Method: Stylet and Oral airway Placement Confirmation: ETT inserted through vocal cords under direct vision,  positive ETCO2 and breath sounds checked- equal and bilateral Secured at: 22 cm Tube secured with: Tape Dental Injury: Teeth and Oropharynx as per pre-operative assessment

## 2017-12-05 NOTE — Op Note (Signed)
CARDIOVASCULAR SURGERY OPERATIVE NOTE  12/05/2017 Whitney [name] [id number]  Surgeon:  Gaye Pollack, MD  First Assistant: Nicholes Rough,  PA-C   Preoperative Diagnosis:  Severe aortic stenosis   Postoperative Diagnosis:  Same   Procedure:  1. Median Sternotomy 2. Extracorporeal circulation 3.   Aortic valve replacement using a 21 mm Edwards INSPIRIS RESILIA valve.  Anesthesia:  General Endotracheal   Clinical History/Surgical Indication:  The patient is a 62 year old woman with a history of diabetes, hyperlipidemia, Sjogren's syndrome, thrombocytopenia, obstructive sleep apnea fibromyalgia, NASH followed at [facility] (Dr. Gerald Dexter), rheumatic fever and known severe aortic stenosis, and morbid obesity with significant weight loss after gastric sleeve surgery. She had an echocardiogram in March 2017 which showed a mean gradient across aortic valve of 43 mmHg. Her most recent echocardiogram on 08/08/2017 shows a trileaflet aortic valve with severe thickening and calcification of the leaflets with a mean gradient of 41 mmHg and a peak gradient of 80 mmHg. The DVI was 0.25. There is moderate regurgitation. The mitral valve had a calcified annulus with mildly thickened and calcified leaflets with findings of mild mitral stenosis with a mean gradient of 5 mmHg. Left ventricular systolic function was normal with moderate concentric left ventricular hypertrophy and grade 1 diastolic dysfunction. The patient says that she feels like she is doing well and not really having any symptoms although she does report that her stamina is not as good as it was a year ago and she can only do activities around the house for about 3 hours before she has done for the day. She denies any substernal chest discomfort. She has had occasional episodes of lightheadedness particularly with position changes. She denies orthopnea and PND. She has had no peripheral edema  She has stage D, severe, symptomatic aortic stenosis with  exertional fatigue and shortness of breath with moderate activity levels. She says that she feels fine but her son and daughter both feel like she is having more symptoms and she is admitting to. I suspect that she has probably decreased her activity level to accommodate the symptoms. I have personally reviewed her echocardiogram and cardiac catheterization. Her echocardiogram shows a trileaflet aortic valve with severe calcification and thickening of the valve leaflets with a mean gradient of 41 mmHg consistent with severe aortic stenosis. The gradient was 43 mmHg in 07/2015. She also has moderate aortic insufficiency. Her mitral valve annulus is calcified and there is calcium extending down beneath the anterior mitral leaflet and the left ventricular outflow tract. Left ventricular systolic function is preserved. Cardiac catheterization shows no significant coronary disease and normal right heart filling pressures. I think aortic valve replacement is indicated in this patient to prevent progressive left ventricular deterioration. Although she has NASH and chronic thrombocytopenia I think she would still be at low risk for open surgical aortic valve replacement. Given her age of 62 and the calcification extending into the mitral valve and into the left ventricular outflow tract I do not think transcatheter aortic valve replacement would be a good option for her. With her liver dysfunction I would recommend using a bioprosthetic valve to avoid the need for chronic anticoagulation with Coumadin.  She is in agreement with that.  She recently saw her hematologist who felt that she could safely undergo aortic valve replacement. I discussed the operative procedure with the patient and family including alternatives, benefits and risks; including but not limited to bleeding, blood transfusion, infection, stroke, myocardial infarction, graft failure, heart block requiring a  permanent pacemaker, organ dysfunction, and death.  Lia Foyer understands and agrees to proceed.    Preparation:  The patient was seen in the preoperative holding area and the correct patient, correct operation were confirmed with the patient after reviewing the medical record and catheterization. The consent was signed by me. Preoperative antibiotics were given. A pulmonary arterial line and radial arterial line were placed by the anesthesia team. The patient was taken back to the operating room and positioned supine on the operating room table. After being placed under general endotracheal anesthesia by the anesthesia team a foley catheter was placed. The neck, chest, abdomen, and both legs were prepped with betadine soap and solution and draped in the usual sterile manner. A surgical time-out was taken and the correct patient and operative procedure were confirmed with the nursing and anesthesia staff.   Pre-bypass TEE:   Complete TEE assessment was performed by Dr. Suella Broad. This showed severe aortic stenosis with a trileaflet valve. LV systolic function was normal.   Aortic valve: The valve is trileaflet and severely thickened with significant calcification. R & Non coronary cusp appear immobile. L coronary cusp display significantly reduced leaflet motion. Severe stenosis Mean gradient 30-52mhg. Moderate AI  Mitral valve: Moderate leaflet calcification noted with reduced motion of the posterior leaflet. No prolapse noted. Mild regurgitation.  Right ventricle: Normal cavity size, wall thickness and ejection fraction.  Tricuspid valve: Mild regurgitation. Regurgitant jet is central.  Pericardium: No pericardial effusion.  Left ventricle: Normal cavity size. Normal LVEF. Moderate concentric LVH. No significant regional wall motion abnormalities.  Pulmonic valve: Normal leaflet motion. No insuffiency noted.  Left Atrial Appendage: No thrombus present.     Post-bypass TEE:  Tricuspid, Pulmonic and Mitral Valve unchanged.  Normal LVEF, no regional motion abnormalities. Aortic valve replaced with 21 mm tissue valve, seated well, no rocking, no perivalvular leaks noted (Mean gradient: 12 mmhg). No dissection noted after cannula removal.   Cardiopulmonary Bypass:  A median sternotomy was performed. The pericardium was opened in the midline. Right ventricular function appeared normal. The ascending aorta was of normal size and had no palpable plaque. There were no contraindications to aortic cannulation or cross-clamping. The patient was fully systemically heparinized and the ACT was maintained > 400 sec. The proximal aortic arch was cannulated with a 20 F aortic cannula for arterial inflow. Venous cannulation was performed via the right atrial appendage using a two-staged venous cannula. An antegrade cardioplegia/vent cannula was inserted into the mid-ascending aorta. A left ventricular vent was placed via the right superior pulmonary vein. A retrograde cardioplegia cannnula was placed into the coronary sinus via the right atrium. Aortic occlusion was performed with a single cross-clamp. Systemic cooling to 32 degrees Centigrade and topical cooling of the heart with iced saline were used. Hyperkalemic antegrade cold blood cardioplegia was used to induce diastolic arrest and then cold blood retrograde cardioplegia was given at about 20 minute intervals throughout the period of arrest to maintain myocardial temperature at or below 10 degrees centigrade. A temperature probe was inserted into the interventricular septum and an insulating pad was placed in the pericardium. Carbon dioxide was insufflated into the pericardium at 5L/min throughout the procedure to minimize intracardiac air.   Aortic Valve Replacement:   A transverse aortotomy was performed 1 cm above the take-off of the right coronary artery. The native valve was tricuspid with calcified leaflets and moderate annular calcification. There was near circumferential  calcification of the aortic root at the sino-tubular  junction and a calcified ring around the ostium of the left main and in the right coronary sinus anteriorly. The ostia of the coronary arteries were in normal position and were not obstructed. The native valve leaflets were excised and the annulus was decalcified with rongeurs. Care was taken to remove all particulate debris. The left ventricle was directly inspected for debris and then irrigated with ice saline solution. The annulus was sized and a size 21 mm Edwards INSPIRIS RESILIA valve was chosen. The model number was [id number] and the serial number was [id number]. While the valve was being prepared 2-0 Ethibond pledgeted horizontal mattress sutures were placed around the annulus with the pledgets in a sub-annular position. The sutures were placed through the sewing ring and the valve lowered into place. The sutures were tied sequentially. The valve seated nicely and the coronary ostia were not obstructed. The prosthetic valve leaflets moved normally and there was no sub-valvular obstruction. The aortotomy was closed using 4-0 Prolene suture in 2 layers with felt strips to reinforce the closure.  Completion:  The patient was rewarmed to 37 degrees Centigrade. De-airing maneuvers were performed and the head placed in trendelenburg position. The crossclamp was removed with a time of 74 minutes. There was spontaneous return of sinus rhythm. The aortotomy was checked for hemostasis. Two temporary epicardial pacing wires were placed on the right atrium and two on the right ventricle. The left ventricular vent and retrograde cardioplegia cannulas were removed. The patient was weaned from CPB without difficulty on no inotropes. CPB time was 98 minutes. Cardiac output was 5 LPM. Heparin was fully reversed with protamine and the aortic and venous cannulas removed. Hemostasis was achieved. Mediastinal drainage tubes were placed. The sternum was closed with  #6 stainless  steel wires. The fascia was closed with continuous # 1 vicryl suture. The subcutaneous tissue was closed with 2-0 vicryl continuous suture. The skin was closed with 3-0 vicryl subcuticular suture. All sponge, needle, and instrument counts were reported correct at the end of the case. Dry sterile dressings were placed over the incisions and around the chest tubes which were connected to pleurevac suction. The patient was then transported to the surgical intensive care unit in  stable condition.

## 2017-12-05 NOTE — Anesthesia Procedure Notes (Signed)
Central Venous Catheter Insertion Performed by: Belinda Block, MD, anesthesiologist Start/End8/05/2017 7:10 AM, 12/05/2017 7:25 AM Preanesthetic checklist: patient identified, IV checked, site marked, risks and benefits discussed, surgical consent, monitors and equipment checked, pre-op evaluation and timeout performed Position: Trendelenburg Lidocaine 1% used for infiltration and patient sedated Hand hygiene performed , maximum sterile barriers used  and Seldinger technique used PA cath was placed.Sheath introducer Swan type:oximetry Procedure performed using ultrasound guided technique. Ultrasound Notes:anatomy identified and image(s) printed for medical record Attempts: 1 Following insertion, line sutured. Post procedure assessment: blood return through all ports  Patient tolerated the procedure well with no immediate complications.

## 2017-12-05 NOTE — Interval H&P Note (Signed)
History and Physical Interval Note:  12/05/2017 7:39 AM  Whitney [name]  has presented today for surgery, with the diagnosis of SEVERE AS  The various methods of treatment have been discussed with the patient and family. After consideration of risks, benefits and other options for treatment, the patient has consented to  Procedure(s): AORTIC VALVE REPLACEMENT (AVR) (N/A) TRANSESOPHAGEAL ECHOCARDIOGRAM (TEE) (N/A) as a surgical intervention .  The patient's history has been reviewed, patient examined, no change in status, stable for surgery.  I have reviewed the patient's chart and labs.  Questions were answered to the patient's satisfaction.     Gaye Pollack

## 2017-12-05 NOTE — Transfer of Care (Signed)
Immediate Anesthesia Transfer of Care Note  Patient: Whitney [name]  Procedure(s) Performed: AORTIC VALVE REPLACEMENT (AVR) TISSUE VALVE 21MM INSPIRIS (N/A Chest) TRANSESOPHAGEAL ECHOCARDIOGRAM (TEE) (N/A )  Patient Location: ICU  Anesthesia Type:General  Level of Consciousness: sedated and Patient remains intubated per anesthesia plan  Airway & Oxygen Therapy: Patient remains intubated per anesthesia plan and Patient placed on Ventilator (see vital sign flow sheet for setting)  Post-op Assessment: Report given to RN and Post -op Vital signs reviewed and stable  Post vital signs: Reviewed and stable  Last Vitals:  Vitals Value Taken Time  BP 123/79 12/05/2017 12:12 PM  Temp    Pulse 80 12/05/2017 12:16 PM  Resp 12 12/05/2017 12:22 PM  SpO2 97 % 12/05/2017 12:22 PM  Vitals shown include unvalidated device data.  Last Pain:  Vitals:   12/05/17 0618  TempSrc: Oral         Complications: No apparent anesthesia complications

## 2017-12-05 NOTE — Anesthesia Procedure Notes (Signed)
Arterial Line Insertion Performed by: Wilburn Cornelia, CRNA, CRNA  Patient location: Pre-op. Preanesthetic checklist: patient identified, IV checked, site marked, risks and benefits discussed, surgical consent, monitors and equipment checked, pre-op evaluation, timeout performed and anesthesia consent Lidocaine 1% used for infiltration and patient sedated Right, radial was placed Catheter size: 20 G Hand hygiene performed , maximum sterile barriers used  and Seldinger technique used Allen's test indicative of satisfactory collateral circulation Attempts: 3 (2 on left side) Procedure performed without using ultrasound guided technique. Following insertion, dressing applied and Biopatch. Post procedure assessment: normal  Patient tolerated the procedure well with no immediate complications.

## 2017-12-05 NOTE — Anesthesia Preprocedure Evaluation (Addendum)
Anesthesia Evaluation  Patient identified by MRN, date of birth, ID band Patient awake    Reviewed: Allergy & Precautions, NPO status , Patient's Chart, lab work & pertinent test results  Airway Mallampati: II  TM Distance: >3 FB Neck ROM: Full    Dental  (+) Dental Advisory Given, Chipped,    Pulmonary asthma , sleep apnea ,    breath sounds clear to auscultation       Cardiovascular + Valvular Problems/Murmurs AS  Rhythm:Regular Rate:Normal + Systolic murmurs    Neuro/Psych PSYCHIATRIC DISORDERS Depression  Neuromuscular disease    GI/Hepatic hiatal hernia, GERD  ,(+) Cirrhosis       , Hepatitis -  Endo/Other  diabetes  Renal/GU      Musculoskeletal  (+) Arthritis , Fibromyalgia -  Abdominal (+) + obese,   Peds  Hematology  (+) anemia ,   Anesthesia Other Findings - Sjogren's Syndrome - NASH  Reproductive/Obstetrics                            Lab Results  Component Value Date   WBC 3.3 (L) 12/03/2017   HGB 13.4 12/03/2017   HCT 42.8 12/03/2017   MCV 92.4 12/03/2017   PLT 71 (L) 12/03/2017   Lab Results  Component Value Date   CREATININE 0.59 12/03/2017   BUN 11 12/03/2017   NA 138 12/03/2017   K 4.1 12/03/2017   CL 106 12/03/2017   CO2 23 12/03/2017   Lab Results  Component Value Date   INR 1.28 12/03/2017   INR 1.2 08/20/2017   INR 1.23 10/23/2016   ECho: - Left ventricle: The cavity size was normal. There was moderate   concentric hypertrophy. Systolic function was normal. Wall motion   was normal; there were no regional wall motion abnormalities.   Doppler parameters are consistent with abnormal left ventricular   relaxation (grade 1 diastolic dysfunction). Doppler parameters   are consistent with elevated ventricular end-diastolic filling   pressure. - Aortic valve: Trileaflet; severely thickened, severely calcified   leaflets. There was severe stenosis. There  was moderate   regurgitation. Mean gradient (S): 41 mm Hg. Peak gradient (S): 80   mm Hg. - Mitral valve: Calcified annulus. Mildly thickened leaflets . The   findings are consistent with mild stenosis. There was mild   regurgitation. Mean gradient (D): 5 mm Hg. - Left atrium: The atrium was moderately dilated. - Right atrium: The atrium was mildly dilated. - Tricuspid valve: There was mild regurgitation. - Pulmonary arteries: Systolic pressure was mildly increased. PA   peak pressure: 33 mm Hg (S). - Inferior vena cava: The vessel was normal in size. - Pericardium, extracardiac: There was no pericardial effusion.  Anesthesia Physical Anesthesia Plan  ASA: IV  Anesthesia Plan: General   Post-op Pain Management:    Induction: Intravenous  PONV Risk Score and Plan: 4 or greater and Treatment may vary due to age or medical condition  Airway Management Planned: Oral ETT  Additional Equipment: Arterial line, CVP, PA Cath, TEE and Ultrasound Guidance Line Placement  Intra-op Plan:   Post-operative Plan: Post-operative intubation/ventilation  Informed Consent: I have reviewed the patients History and Physical, chart, labs and discussed the procedure including the risks, benefits and alternatives for the proposed anesthesia with the patient or authorized representative who has indicated his/her understanding and acceptance.   Dental advisory given  Plan Discussed with: CRNA  Anesthesia Plan Comments:  Anesthesia Quick Evaluation

## 2017-12-05 NOTE — Progress Notes (Signed)
      [address]       [place] [address]             [phone]      S/p AVR  Extubated  BP 103/73   Pulse 80   Temp 98.2 F (36.8 C)   Resp 15   Ht 5' 4"  (1.626 m)   Wt 236 lb 1.8 oz (107.1 kg)   SpO2 99%   BMI 40.53 kg/m  Ci= 2.5  Intake/Output Summary (Last 24 hours) at 12/05/2017 2018 Last data filed at 12/05/2017 1700 Gross per 24 hour  Intake 4336.7 ml  Output 1131 ml  Net 3205.7 ml     Doing well early postop

## 2017-12-05 NOTE — Brief Op Note (Signed)
12/05/2017  2:26 PM  PATIENT:  Whitney [name]  62 y.o. female  PRE-OPERATIVE DIAGNOSIS:  SEVERE AS  POST-OPERATIVE DIAGNOSIS:  SEVERE AS  PROCEDURE:  Procedure(s): AORTIC VALVE REPLACEMENT (AVR) TISSUE VALVE 21MM INSPIRIS (N/A) TRANSESOPHAGEAL ECHOCARDIOGRAM (TEE) (N/A)  SURGEON:  Surgeon(s) and Role:    * Bartle, Fernande Boyden, MD - Primary  PHYSICIAN ASSISTANT:  Nicholes Rough, PA-C   ANESTHESIA:   general  EBL:  400 mL   BLOOD ADMINISTERED:none  DRAINS: ROUTINE   LOCAL MEDICATIONS USED:  NONE  SPECIMEN:  Source of Specimen:  AORTIC VALVE LEAFLETS  DISPOSITION OF SPECIMEN:  PATHOLOGY  COUNTS:  YES  DICTATION: .Dragon Dictation  PLAN OF CARE: Admit to inpatient   PATIENT DISPOSITION:  ICU - intubated and hemodynamically stable.   Delay start of Pharmacological VTE agent (>24hrs) due to surgical blood loss or risk of bleeding: yes

## 2017-12-06 ENCOUNTER — Inpatient Hospital Stay (HOSPITAL_COMMUNITY): Payer: Medicare Other

## 2017-12-06 ENCOUNTER — Encounter (HOSPITAL_COMMUNITY): Payer: Self-pay | Admitting: Surgery

## 2017-12-06 LAB — POCT I-STAT 3, ART BLOOD GAS (G3+)
ACID-BASE DEFICIT: 3 mmol/L — AB (ref 0.0–2.0)
ACID-BASE DEFICIT: 3 mmol/L — AB (ref 0.0–2.0)
BICARBONATE: 23.2 mmol/L (ref 20.0–28.0)
BICARBONATE: 22.8 mmol/L (ref 20.0–28.0)
O2 SAT: 97 %
O2 Saturation: 97 %
PO2 ART: 86 mmHg (ref 83.0–108.0)
PO2 ART: 101 mmHg (ref 83.0–108.0)
TCO2: 25 mmol/L (ref 22–32)
TCO2: 24 mmol/L (ref 22–32)
pCO2 arterial: 41.7 mmHg (ref 32.0–48.0)
pCO2 arterial: 43.5 mmHg (ref 32.0–48.0)
pH, Arterial: 7.349 — ABNORMAL LOW (ref 7.350–7.450)
pH, Arterial: 7.326 — ABNORMAL LOW (ref 7.350–7.450)

## 2017-12-06 LAB — GLUCOSE, CAPILLARY
GLUCOSE-CAPILLARY: 106 mg/dL — AB (ref 70–99)
GLUCOSE-CAPILLARY: 117 mg/dL — AB (ref 70–99)
GLUCOSE-CAPILLARY: 118 mg/dL — AB (ref 70–99)
GLUCOSE-CAPILLARY: 123 mg/dL — AB (ref 70–99)
GLUCOSE-CAPILLARY: 150 mg/dL — AB (ref 70–99)
Glucose-Capillary: 109 mg/dL — ABNORMAL HIGH (ref 70–99)
Glucose-Capillary: 106 mg/dL — ABNORMAL HIGH (ref 70–99)
Glucose-Capillary: 102 mg/dL — ABNORMAL HIGH (ref 70–99)
Glucose-Capillary: 102 mg/dL — ABNORMAL HIGH (ref 70–99)
Glucose-Capillary: 160 mg/dL — ABNORMAL HIGH (ref 70–99)

## 2017-12-06 LAB — CBC
HCT: 37.3 % (ref 36.0–46.0)
HCT: 38.1 % (ref 36.0–46.0)
HEMOGLOBIN: 12.1 g/dL (ref 12.0–15.0)
Hemoglobin: 11.8 g/dL — ABNORMAL LOW (ref 12.0–15.0)
MCH: 29.3 pg (ref 26.0–34.0)
MCH: 29.4 pg (ref 26.0–34.0)
MCHC: 31.6 g/dL (ref 30.0–36.0)
MCHC: 31.8 g/dL (ref 30.0–36.0)
MCV: 92.6 fL (ref 78.0–100.0)
MCV: 92.5 fL (ref 78.0–100.0)
PLATELETS: 75 10*3/uL — AB (ref 150–400)
PLATELETS: 75 10*3/uL — AB (ref 150–400)
RBC: 4.03 MIL/uL (ref 3.87–5.11)
RBC: 4.12 MIL/uL (ref 3.87–5.11)
RDW: 14.9 % (ref 11.5–15.5)
RDW: 15.2 % (ref 11.5–15.5)
WBC: 6.2 10*3/uL (ref 4.0–10.5)
WBC: 10 10*3/uL (ref 4.0–10.5)

## 2017-12-06 LAB — BASIC METABOLIC PANEL
ANION GAP: 7 (ref 5–15)
BUN: 16 mg/dL (ref 8–23)
CHLORIDE: 109 mmol/L (ref 98–111)
CO2: 23 mmol/L (ref 22–32)
Calcium: 7.7 mg/dL — ABNORMAL LOW (ref 8.9–10.3)
Creatinine, Ser: 0.66 mg/dL (ref 0.44–1.00)
Glucose, Bld: 115 mg/dL — ABNORMAL HIGH (ref 70–99)
POTASSIUM: 4.4 mmol/L (ref 3.5–5.1)
SODIUM: 139 mmol/L (ref 135–145)

## 2017-12-06 LAB — BPAM PLATELET PHERESIS
BLOOD PRODUCT EXPIRATION DATE: 201908022359
Blood Product Expiration Date: 201908012359
ISSUE DATE / TIME: 201908011047
ISSUE DATE / TIME: 201908011047
UNIT TYPE AND RH: 7300
Unit Type and Rh: 5100

## 2017-12-06 LAB — POCT I-STAT, CHEM 8
BUN: 14 mg/dL (ref 8–23)
CREATININE: 0.5 mg/dL (ref 0.44–1.00)
Calcium, Ion: 1.16 mmol/L (ref 1.15–1.40)
Chloride: 108 mmol/L (ref 98–111)
GLUCOSE: 131 mg/dL — AB (ref 70–99)
HEMATOCRIT: 36 % (ref 36.0–46.0)
HEMOGLOBIN: 12.2 g/dL (ref 12.0–15.0)
Potassium: 5 mmol/L (ref 3.5–5.1)
Sodium: 139 mmol/L (ref 135–145)
TCO2: 23 mmol/L (ref 22–32)

## 2017-12-06 LAB — POCT I-STAT 4, (NA,K, GLUC, HGB,HCT)
Glucose, Bld: 124 mg/dL — ABNORMAL HIGH (ref 70–99)
HCT: 33 % — ABNORMAL LOW (ref 36.0–46.0)
Hemoglobin: 11.2 g/dL — ABNORMAL LOW (ref 12.0–15.0)
POTASSIUM: 3.9 mmol/L (ref 3.5–5.1)
SODIUM: 142 mmol/L (ref 135–145)

## 2017-12-06 LAB — PREPARE PLATELET PHERESIS
UNIT DIVISION: 0
Unit division: 0

## 2017-12-06 LAB — POTASSIUM: POTASSIUM: 4.5 mmol/L (ref 3.5–5.1)

## 2017-12-06 LAB — CREATININE, SERUM
CREATININE: 1.16 mg/dL — AB (ref 0.44–1.00)
GFR calc non Af Amer: 49 mL/min — ABNORMAL LOW (ref >60)
GFR, EST AFRICAN AMERICAN: 56 mL/min — AB (ref >60)

## 2017-12-06 LAB — MAGNESIUM
MAGNESIUM: 2.4 mg/dL (ref 1.7–2.4)
MAGNESIUM: 2.4 mg/dL (ref 1.7–2.4)

## 2017-12-06 MED ORDER — FUROSEMIDE 10 MG/ML IJ SOLN
40.0000 mg | Freq: Two times a day (BID) | INTRAMUSCULAR | Status: AC
  Administered 2017-12-06 (×2): 40 mg via INTRAVENOUS
  Filled 2017-12-06 (×2): qty 4

## 2017-12-06 MED ORDER — CHLORHEXIDINE GLUCONATE CLOTH 2 % EX PADS
6.0000 | MEDICATED_PAD | Freq: Every day | CUTANEOUS | Status: AC
  Administered 2017-12-08 – 2017-12-10 (×3): 6 via TOPICAL

## 2017-12-06 MED ORDER — MUPIROCIN 2 % EX OINT
1.0000 "application " | TOPICAL_OINTMENT | Freq: Two times a day (BID) | CUTANEOUS | Status: AC
  Administered 2017-12-06 – 2017-12-10 (×9): 1 via NASAL
  Filled 2017-12-06 (×2): qty 22

## 2017-12-06 MED FILL — Potassium Chloride Inj 2 mEq/ML: INTRAVENOUS | Qty: 40 | Status: AC

## 2017-12-06 MED FILL — Heparin Sodium (Porcine) Inj 1000 Unit/ML: INTRAMUSCULAR | Qty: 30 | Status: AC

## 2017-12-06 MED FILL — Thrombin For Soln 5000 Unit: CUTANEOUS | Qty: 4 | Status: AC

## 2017-12-06 MED FILL — Magnesium Sulfate Inj 50%: INTRAMUSCULAR | Qty: 10 | Status: AC

## 2017-12-06 MED FILL — Heparin Sodium (Porcine) Inj 1000 Unit/ML: INTRAMUSCULAR | Qty: 2500 | Status: AC

## 2017-12-06 NOTE — Progress Notes (Signed)
1 Day Post-Op Procedure(s) (LRB): AORTIC VALVE REPLACEMENT (AVR) TISSUE VALVE 21MM INSPIRIS (N/A) TRANSESOPHAGEAL ECHOCARDIOGRAM (TEE) (N/A) Subjective: No complaints  Objective: Vital signs in last 24 hours: Temp:  [96.6 F (35.9 C)-98.4 F (36.9 C)] 97.7 F (36.5 C) (08/02 0700) Pulse Rate:  [80] 80 (08/01 1820) Cardiac Rhythm: Atrial paced (08/02 0400) Resp:  [7-20] 16 (08/02 0700) BP: (85-123)/(54-79) 98/61 (08/02 0700) SpO2:  [95 %-100 %] 100 % (08/02 0700) Arterial Line BP: (85-160)/(49-90) 114/50 (08/02 0700) FiO2 (%):  [40 %-50 %] 40 % (08/01 1749) Weight:  [105 kg (231 lb 7.7 oz)-107.1 kg (236 lb 1.8 oz)] 105 kg (231 lb 7.7 oz) (08/02 0500)  Hemodynamic parameters for last 24 hours: PAP: (24-40)/(15-24) 31/18 CO:  [3.6 L/min-5 L/min] 3.6 L/min CI:  [1.8 L/min/m2-2.5 L/min/m2] 1.8 L/min/m2  Intake/Output from previous day: 08/01 0701 - 08/02 0700 In: 5732.3 [I.V.:3739.5; Blood:943; IV Piggyback:1049.9] Out: 3338 [Urine:1045; Blood:400; Chest Tube:286] Intake/Output this shift: No intake/output data recorded.  General appearance: alert and cooperative Neurologic: intact Heart: regular rate and rhythm, S1, S2 normal, no murmur, click, rub or gallop Lungs: clear to auscultation bilaterally Extremities: edema mild Wound: dressing dry  Lab Results: Recent Labs    12/05/17 1808 12/05/17 1811 12/06/17 0419  WBC 5.3  --  6.2  HGB 12.7 12.2 11.8*  HCT 40.4 36.0 37.3  PLT 83*  --  75*   BMET:  Recent Labs    12/03/17 1501  12/05/17 1811 12/06/17 0419  NA 138   < > 139 139  K 4.1   < > 5.0 4.4  CL 106   < > 108 109  CO2 23  --   --  23  GLUCOSE 84   < > 131* 115*  BUN 11   < > 14 16  CREATININE 0.59   < > 0.50 0.66  CALCIUM 8.8*  --   --  7.7*   < > = values in this interval not displayed.    PT/INR:  Recent Labs    12/05/17 1207  LABPROT 20.0*  INR 1.71   ABG    Component Value Date/Time   PHART 7.326 (L) 12/05/2017 1816   HCO3 22.8  12/05/2017 1816   TCO2 24 12/05/2017 1816   ACIDBASEDEF 3.0 (H) 12/05/2017 1816   O2SAT 97.0 12/05/2017 1816   CBG (last 3)  Recent Labs    12/06/17 0518 12/06/17 0615 12/06/17 0700  GLUCAP 118* 102* 102*   CXR: ok  ECG: sinus, no acute changes  Assessment/Plan: S/P Procedure(s) (LRB): AORTIC VALVE REPLACEMENT (AVR) TISSUE VALVE 21MM INSPIRIS (N/A) TRANSESOPHAGEAL ECHOCARDIOGRAM (TEE) (N/A)  Hemodynamically stable in sinus rhythm. Will hold Lopressor this am until off neo. Mobilize Diuresis Preop Hgb A1c normal so will DC insulin. d/c tubes/lines Continue foley due to diuresing patient, patient in ICU and urinary output monitoring See progression orders   LOS: 1 day    Gaye Pollack 12/06/2017

## 2017-12-06 NOTE — Progress Notes (Signed)
Patient ID: Whitney [name], female   DOB: 09/10/1955, 62 y.o.   MRN: [id number] [facility] Evening Rounds:  Hemodynamically stable in sinus rhythm 92  Feels ok. Has not been out of bed or ambulating yet.  Urine output ok.

## 2017-12-06 NOTE — Procedures (Signed)
Extubation Procedure Note  Patient Details:   Name: Whitney [name] DOB: [birth year] MRN: [id number]   Airway Documentation:    Vent end date: 12/05/17 Vent end time: 1820   Evaluation  O2 sats: stable throughout Complications: No apparent complications Patient did tolerate procedure well. Bilateral Breath Sounds: Clear   Yes pt able to vocalize.   Pt extubated at this time per Rapid wean protocol. Pt able to breathe around deflated cuff. Nif and VC performed with -20cmH2O and 0.750 L. Pt was extubated and placed on 4L [place].  No stridor noted. Pt has strong, adequate cough. IS performed with [id number].   TIrineo AxonLEndoscopy Associates Of Valley Forge8/06/2017, 7:40 AM

## 2017-12-06 NOTE — Addendum Note (Signed)
Addendum  created 12/06/17 1310 by Effie Berkshire, MD   Diagnosis association updated

## 2017-12-06 NOTE — Anesthesia Postprocedure Evaluation (Signed)
Anesthesia Post Note  Patient: Whitney [name]  Procedure(s) Performed: AORTIC VALVE REPLACEMENT (AVR) TISSUE VALVE 21MM INSPIRIS (N/A Chest) TRANSESOPHAGEAL ECHOCARDIOGRAM (TEE) (N/A )     Patient location during evaluation: SICU Anesthesia Type: General Level of consciousness: awake and alert Pain management: pain level controlled Vital Signs Assessment: post-procedure vital signs reviewed and stable Respiratory status: spontaneous breathing Cardiovascular status: stable Postop Assessment: no apparent nausea or vomiting Anesthetic complications: no    Last Vitals:  Vitals:   12/06/17 1130 12/06/17 1200  BP: 94/62 93/60  Pulse:    Resp: 19 18  Temp:    SpO2: 99% 99%    Last Pain:  Vitals:   12/06/17 0830  TempSrc:   PainSc: [name]

## 2017-12-07 ENCOUNTER — Inpatient Hospital Stay (HOSPITAL_COMMUNITY): Payer: Medicare Other

## 2017-12-07 LAB — CBC
HEMATOCRIT: 35.7 % — AB (ref 36.0–46.0)
HEMOGLOBIN: 11.3 g/dL — AB (ref 12.0–15.0)
MCH: 29.4 pg (ref 26.0–34.0)
MCHC: 31.7 g/dL (ref 30.0–36.0)
MCV: 93 fL (ref 78.0–100.0)
Platelets: 54 10*3/uL — ABNORMAL LOW (ref 150–400)
RBC: 3.84 MIL/uL — AB (ref 3.87–5.11)
RDW: 15.2 % (ref 11.5–15.5)
WBC: 9.4 10*3/uL (ref 4.0–10.5)

## 2017-12-07 LAB — BASIC METABOLIC PANEL
ANION GAP: 6 (ref 5–15)
BUN: 34 mg/dL — ABNORMAL HIGH (ref 8–23)
CALCIUM: 7.6 mg/dL — AB (ref 8.9–10.3)
CO2: 23 mmol/L (ref 22–32)
Chloride: 105 mmol/L (ref 98–111)
Creatinine, Ser: 1.27 mg/dL — ABNORMAL HIGH (ref 0.44–1.00)
GFR calc non Af Amer: 44 mL/min — ABNORMAL LOW (ref >60)
GFR, EST AFRICAN AMERICAN: 51 mL/min — AB (ref >60)
GLUCOSE: 145 mg/dL — AB (ref 70–99)
POTASSIUM: 4.5 mmol/L (ref 3.5–5.1)
Sodium: 134 mmol/L — ABNORMAL LOW (ref 135–145)

## 2017-12-07 LAB — GLUCOSE, CAPILLARY
GLUCOSE-CAPILLARY: 132 mg/dL — AB (ref 70–99)
GLUCOSE-CAPILLARY: 126 mg/dL — AB (ref 70–99)
Glucose-Capillary: 156 mg/dL — ABNORMAL HIGH (ref 70–99)
Glucose-Capillary: 123 mg/dL — ABNORMAL HIGH (ref 70–99)
Glucose-Capillary: 131 mg/dL — ABNORMAL HIGH (ref 70–99)
Glucose-Capillary: 140 mg/dL — ABNORMAL HIGH (ref 70–99)

## 2017-12-07 MED ORDER — ASPIRIN EC 81 MG PO TBEC
81.0000 mg | DELAYED_RELEASE_TABLET | Freq: Every day | ORAL | Status: DC
  Administered 2017-12-07 – 2017-12-12 (×6): 81 mg via ORAL
  Filled 2017-12-07 (×6): qty 1

## 2017-12-07 MED ORDER — AMIODARONE IV BOLUS ONLY 150 MG/100ML
150.0000 mg | Freq: Once | INTRAVENOUS | Status: AC
  Administered 2017-12-07: 150 mg via INTRAVENOUS
  Filled 2017-12-07: qty 100

## 2017-12-07 MED ORDER — ORAL CARE MOUTH RINSE
15.0000 mL | Freq: Two times a day (BID) | OROMUCOSAL | Status: DC
  Administered 2017-12-07 – 2017-12-12 (×7): 15 mL via OROMUCOSAL

## 2017-12-07 MED ORDER — METOPROLOL TARTRATE 12.5 MG HALF TABLET
12.5000 mg | ORAL_TABLET | Freq: Two times a day (BID) | ORAL | Status: DC
  Administered 2017-12-07 – 2017-12-08 (×3): 12.5 mg via ORAL
  Filled 2017-12-07 (×3): qty 1

## 2017-12-07 MED ORDER — AMIODARONE HCL 200 MG PO TABS
400.0000 mg | ORAL_TABLET | Freq: Two times a day (BID) | ORAL | Status: DC
Start: 2017-12-07 — End: 2017-12-12
  Administered 2017-12-07 – 2017-12-12 (×10): 400 mg via ORAL
  Filled 2017-12-07 (×10): qty 2

## 2017-12-07 NOTE — Progress Notes (Signed)
Patient ID: Whitney [name], female   DOB: 1955/09/23, 62 y.o.   MRN: [id number] TCTS Evening Rounds:  Hemodynamically stable in sinus rhythm.  Ambulated twice today.  Urine output ok.

## 2017-12-07 NOTE — Progress Notes (Signed)
2 Days Post-Op Procedure(s) (LRB): AORTIC VALVE REPLACEMENT (AVR) TISSUE VALVE 21MM INSPIRIS (N/A) TRANSESOPHAGEAL ECHOCARDIOGRAM (TEE) (N/A) Subjective:  No complaints  Objective: Vital signs in last 24 hours: Temp:  [98.1 F (36.7 C)-99 F (37.2 C)] 98.3 F (36.8 C) (08/03 0735) Cardiac Rhythm: Atrial paced (08/03 0810) Resp:  [6-27] 6 (08/03 0900) BP: (83-118)/(52-93) 108/70 (08/03 0900) SpO2:  [91 %-100 %] 96 % (08/03 0900) Weight:  [106.2 kg (234 lb 2.1 oz)] 106.2 kg (234 lb 2.1 oz) (08/03 0500)  Hemodynamic parameters for last 24 hours:    Intake/Output from previous day: 08/02 0701 - 08/03 0700 In: 1050.9 [P.O.:720; I.V.:230.9; IV Piggyback:100] Out: 980 [Urine:980] Intake/Output this shift: No intake/output data recorded.  General appearance: alert and cooperative Heart: regular rate and rhythm, S1, S2 normal, no murmur, click, rub or gallop Lungs: clear to auscultation bilaterally Extremities: edema mild Wound: dressing dry  Lab Results: Recent Labs    12/06/17 1651 12/07/17 0308  WBC 10.0 9.4  HGB 12.1 11.3*  HCT 38.1 35.7*  PLT 75* 54*   BMET:  Recent Labs    12/06/17 0419 12/06/17 1651 12/06/17 1657 12/07/17 0308  NA 139  --   --  134*  K 4.4  --  4.5 4.5  CL 109  --   --  105  CO2 23  --   --  23  GLUCOSE 115*  --   --  145*  BUN 16  --   --  34*  CREATININE 0.66 1.16*  --  1.27*  CALCIUM 7.7*  --   --  7.6*    PT/INR:  Recent Labs    12/05/17 1207  LABPROT 20.0*  INR 1.71   ABG    Component Value Date/Time   PHART 7.326 (L) 12/05/2017 1816   HCO3 22.8 12/05/2017 1816   TCO2 24 12/05/2017 1816   ACIDBASEDEF 3.0 (H) 12/05/2017 1816   O2SAT 97.0 12/05/2017 1816   CBG (last 3)  Recent Labs    12/06/17 2343 12/07/17 0333 12/07/17 0732  GLUCAP 150* 132* 123*   CXR: mild left base atelectasis  Assessment/Plan: S/P Procedure(s) (LRB): AORTIC VALVE REPLACEMENT (AVR) TISSUE VALVE 21MM INSPIRIS (N/A) TRANSESOPHAGEAL  ECHOCARDIOGRAM (TEE) (N/A)  POD 2 Hemodynamically stable in sinus with some bursts of SVT. Will resume Lopressor.  Volume excess: did not diurese much yesterday so I =O. Will hold off on diuresis today and plan to resume tomorrow if creat stable.  Chronic thrombocytopenia: due to splenomegaly from portal hypertension and NASH. Follow.  Start low dose aspirin with bioprosthetic AVR.  DC foley.  Ambulate, IS.   LOS: 2 days    Gaye Pollack 12/07/2017

## 2017-12-08 ENCOUNTER — Other Ambulatory Visit: Payer: Self-pay

## 2017-12-08 LAB — CBC
HCT: 35.7 % — ABNORMAL LOW (ref 36.0–46.0)
Hemoglobin: 11.3 g/dL — ABNORMAL LOW (ref 12.0–15.0)
MCH: 29.4 pg (ref 26.0–34.0)
MCHC: 31.7 g/dL (ref 30.0–36.0)
MCV: 93 fL (ref 78.0–100.0)
Platelets: 54 10*3/uL — ABNORMAL LOW (ref 150–400)
RBC: 3.84 MIL/uL — ABNORMAL LOW (ref 3.87–5.11)
RDW: 15.2 % (ref 11.5–15.5)
WBC: 10 10*3/uL (ref 4.0–10.5)

## 2017-12-08 LAB — BASIC METABOLIC PANEL
Anion gap: 6 (ref 5–15)
BUN: 37 mg/dL — ABNORMAL HIGH (ref 8–23)
CO2: 28 mmol/L (ref 22–32)
Calcium: 8.1 mg/dL — ABNORMAL LOW (ref 8.9–10.3)
Chloride: 101 mmol/L (ref 98–111)
Creatinine, Ser: 0.96 mg/dL (ref 0.44–1.00)
GFR calc Af Amer: >60 mL/min (ref >60)
GFR calc non Af Amer: >60 mL/min (ref >60)
Glucose, Bld: 123 mg/dL — ABNORMAL HIGH (ref 70–99)
POTASSIUM: 4.8 mmol/L (ref 3.5–5.1)
SODIUM: 135 mmol/L (ref 135–145)

## 2017-12-08 LAB — GLUCOSE, CAPILLARY
GLUCOSE-CAPILLARY: 139 mg/dL — AB (ref 70–99)
GLUCOSE-CAPILLARY: 124 mg/dL — AB (ref 70–99)
Glucose-Capillary: 99 mg/dL (ref 70–99)
Glucose-Capillary: 165 mg/dL — ABNORMAL HIGH (ref 70–99)

## 2017-12-08 MED ORDER — SODIUM CHLORIDE 0.9% FLUSH: 3.0000 mL | INTRAVENOUS | Status: DC | PRN

## 2017-12-08 MED ORDER — FUROSEMIDE 40 MG PO TABS
40.0000 mg | ORAL_TABLET | Freq: Every day | ORAL | Status: DC
  Administered 2017-12-08: 40 mg via ORAL
  Filled 2017-12-08: qty 1

## 2017-12-08 MED ORDER — BISACODYL 10 MG RE SUPP: 10.0000 mg | Freq: Every day | RECTAL | Status: DC | PRN

## 2017-12-08 MED ORDER — ONDANSETRON HCL 4 MG PO TABS: 4.0000 mg | ORAL_TABLET | Freq: Four times a day (QID) | ORAL | Status: DC | PRN

## 2017-12-08 MED ORDER — MOVING RIGHT ALONG BOOK
Freq: Once | Status: DC
  Filled 2017-12-08: qty 1

## 2017-12-08 MED ORDER — METOPROLOL TARTRATE 12.5 MG HALF TABLET
12.5000 mg | ORAL_TABLET | Freq: Two times a day (BID) | ORAL | Status: DC
  Administered 2017-12-08 – 2017-12-09 (×3): 12.5 mg via ORAL
  Filled 2017-12-08 (×3): qty 1

## 2017-12-08 MED ORDER — TRAMADOL HCL 50 MG PO TABS
50.0000 mg | ORAL_TABLET | Freq: Four times a day (QID) | ORAL | Status: DC | PRN
  Administered 2017-12-08 – 2017-12-09 (×2): 50 mg via ORAL
  Filled 2017-12-08 (×2): qty 1

## 2017-12-08 MED ORDER — SODIUM CHLORIDE 0.9 % IV SOLN: 250.0000 mL | INTRAVENOUS | Status: DC | PRN

## 2017-12-08 MED ORDER — DOCUSATE SODIUM 100 MG PO CAPS
200.0000 mg | ORAL_CAPSULE | Freq: Every day | ORAL | Status: DC
  Administered 2017-12-09 – 2017-12-12 (×3): 200 mg via ORAL
  Filled 2017-12-08 (×4): qty 2

## 2017-12-08 MED ORDER — BISACODYL 5 MG PO TBEC
10.0000 mg | DELAYED_RELEASE_TABLET | Freq: Every day | ORAL | Status: DC | PRN
  Administered 2017-12-11: 10 mg via ORAL
  Filled 2017-12-08: qty 2

## 2017-12-08 MED ORDER — OXYCODONE HCL 5 MG PO TABS
5.0000 mg | ORAL_TABLET | ORAL | Status: DC | PRN
  Administered 2017-12-09 – 2017-12-11 (×4): 10 mg via ORAL
  Filled 2017-12-08 (×5): qty 2

## 2017-12-08 MED ORDER — SODIUM CHLORIDE 0.9% FLUSH
3.0000 mL | Freq: Two times a day (BID) | INTRAVENOUS | Status: DC
  Administered 2017-12-08 – 2017-12-12 (×8): 3 mL via INTRAVENOUS

## 2017-12-08 MED ORDER — ONDANSETRON HCL 4 MG/2ML IJ SOLN: 4.0000 mg | Freq: Four times a day (QID) | INTRAMUSCULAR | Status: DC | PRN

## 2017-12-08 MED ORDER — PANTOPRAZOLE SODIUM 40 MG PO TBEC
40.0000 mg | DELAYED_RELEASE_TABLET | Freq: Every day | ORAL | Status: DC
  Administered 2017-12-09 – 2017-12-12 (×4): 40 mg via ORAL
  Filled 2017-12-08 (×4): qty 1

## 2017-12-08 MED ORDER — ASPIRIN EC 81 MG PO TBEC: 81.0000 mg | DELAYED_RELEASE_TABLET | Freq: Every day | ORAL | Status: DC

## 2017-12-08 NOTE — Progress Notes (Signed)
Patient arrived to the unit from 2 heart. Patient is  Alert and Oriented X4. Patient states she is having a little pain in her shoulders. Midline is clean dry and intact. Pacing Wires are taped and gauze are covering chest tube sutures. Patient has been placed on telemetry, CCMD notified and 2nd verification. Patient is resting comfortably in bed. Will continue to monitor.

## 2017-12-08 NOTE — Progress Notes (Signed)
3 Days Post-Op Procedure(s) (LRB): AORTIC VALVE REPLACEMENT (AVR) TISSUE VALVE 21MM INSPIRIS (N/A) TRANSESOPHAGEAL ECHOCARDIOGRAM (TEE) (N/A) Subjective: Only complaint is of left shoulder pain  Went into atrial fib with RVR last night and converted with amio.  Objective: Vital signs in last 24 hours: Temp:  [98.4 F (36.9 C)-98.9 F (37.2 C)] 98.5 F (36.9 C) (08/04 0829) Cardiac Rhythm: Normal sinus rhythm (08/04 0900) Resp:  [14-31] 19 (08/04 1000) BP: (86-116)/(55-83) 116/77 (08/04 1000) SpO2:  [90 %-100 %] 97 % (08/04 1000) Weight:  [106.6 kg (235 lb 0.2 oz)] 106.6 kg (235 lb 0.2 oz) (08/04 0500)  Hemodynamic parameters for last 24 hours:    Intake/Output from previous day: 08/03 0701 - 08/04 0700 In: 600 [P.O.:600] Out: 1000 [Urine:1000] Intake/Output this shift: Total I/O In: 240 [P.O.:240] Out: -   General appearance: alert and cooperative Neurologic: intact Heart: regular rate and rhythm, S1, S2 normal, no murmur, click, rub or gallop Lungs: clear to auscultation bilaterally Extremities: extremities normal, atraumatic, no cyanosis or edema Wound: incision ok  Lab Results: Recent Labs    12/07/17 0308 12/08/17 0211  WBC 9.4 10.0  HGB 11.3* 11.3*  HCT 35.7* 35.7*  PLT 54* 54*   BMET:  Recent Labs    12/07/17 0308 12/08/17 0211  NA 134* 135  K 4.5 4.8  CL 105 101  CO2 23 28  GLUCOSE 145* 123*  BUN 34* 37*  CREATININE 1.27* 0.96  CALCIUM 7.6* 8.1*    PT/INR:  Recent Labs    12/05/17 1207  LABPROT 20.0*  INR 1.71   ABG    Component Value Date/Time   PHART 7.326 (L) 12/05/2017 1816   HCO3 22.8 12/05/2017 1816   TCO2 24 12/05/2017 1816   ACIDBASEDEF 3.0 (H) 12/05/2017 1816   O2SAT 97.0 12/05/2017 1816   CBG (last 3)  Recent Labs    12/07/17 1629 12/07/17 2210 12/08/17 0630  GLUCAP 131* 140* 99    Assessment/Plan: S/P Procedure(s) (LRB): AORTIC VALVE REPLACEMENT (AVR) TISSUE VALVE 21MM INSPIRIS (N/A) TRANSESOPHAGEAL  ECHOCARDIOGRAM (TEE) (N/A) POD 3 Hemodynamically stable. Continue low dose Lopressor.  Postop atrial fib converted with amio. Continue po amio.  Volume excess: wt is 235 and she says her weight was recorded at 217 at her preop assessment. Continue diuretic.  Chronic thormbocytopenia due to NASH, portal hypertension and splenomegaly: stable.  Transfer to 4E and continue mobilization.    LOS: 3 days    Gaye Pollack 12/08/2017

## 2017-12-09 LAB — GLUCOSE, CAPILLARY
GLUCOSE-CAPILLARY: 116 mg/dL — AB (ref 70–99)
GLUCOSE-CAPILLARY: 131 mg/dL — AB (ref 70–99)
Glucose-Capillary: 118 mg/dL — ABNORMAL HIGH (ref 70–99)

## 2017-12-09 MED ORDER — AMIODARONE HCL IN DEXTROSE 360-4.14 MG/200ML-% IV SOLN: 30.0000 mg/h | INTRAVENOUS | Status: DC

## 2017-12-09 MED ORDER — AMIODARONE LOAD VIA INFUSION
150.0000 mg | Freq: Once | INTRAVENOUS | Status: DC
  Filled 2017-12-09: qty 83.34

## 2017-12-09 MED ORDER — GUAIFENESIN ER 600 MG PO TB12
600.0000 mg | ORAL_TABLET | Freq: Two times a day (BID) | ORAL | Status: DC
Start: 2017-12-09 — End: 2017-12-12
  Administered 2017-12-09 – 2017-12-12 (×7): 600 mg via ORAL
  Filled 2017-12-09 (×7): qty 1

## 2017-12-09 MED ORDER — AMIODARONE HCL IN DEXTROSE 360-4.14 MG/200ML-% IV SOLN
60.0000 mg/h | INTRAVENOUS | Status: DC
  Filled 2017-12-09: qty 200

## 2017-12-09 MED ORDER — FUROSEMIDE 40 MG PO TABS
40.0000 mg | ORAL_TABLET | Freq: Every day | ORAL | Status: DC
  Administered 2017-12-10 – 2017-12-12 (×3): 40 mg via ORAL
  Filled 2017-12-09 (×3): qty 1

## 2017-12-09 MED ORDER — AMIODARONE IV BOLUS ONLY 150 MG/100ML
150.0000 mg | Freq: Once | INTRAVENOUS | Status: AC
  Administered 2017-12-09: 150 mg via INTRAVENOUS
  Filled 2017-12-09: qty 100

## 2017-12-09 MED ORDER — FUROSEMIDE 10 MG/ML IJ SOLN
40.0000 mg | Freq: Once | INTRAMUSCULAR | Status: AC
  Administered 2017-12-09: 40 mg via INTRAVENOUS
  Filled 2017-12-09: qty 4

## 2017-12-09 MED ORDER — LACTULOSE 10 GM/15ML PO SOLN
20.0000 g | Freq: Once | ORAL | Status: AC
  Administered 2017-12-09: 20 g via ORAL
  Filled 2017-12-09: qty 30

## 2017-12-09 MED FILL — Sodium Bicarbonate IV Soln 8.4%: INTRAVENOUS | Qty: 50 | Status: AC

## 2017-12-09 MED FILL — Lidocaine HCl(Cardiac) IV PF Soln Pref Syr 100 MG/5ML (2%): INTRAVENOUS | Qty: 5 | Status: AC

## 2017-12-09 MED FILL — Heparin Sodium (Porcine) Inj 1000 Unit/ML: INTRAMUSCULAR | Qty: 10 | Status: AC

## 2017-12-09 MED FILL — Mannitol IV Soln 20%: INTRAVENOUS | Qty: 500 | Status: AC

## 2017-12-09 MED FILL — Sodium Chloride IV Soln 0.9%: INTRAVENOUS | Qty: 2000 | Status: AC

## 2017-12-09 MED FILL — Electrolyte-R (PH 7.4) Solution: INTRAVENOUS | Qty: 3000 | Status: AC

## 2017-12-09 NOTE — Discharge Instructions (Signed)
Aortic Valve Replacement, Care After Refer to this sheet in the next few weeks. These instructions provide you with information about caring for yourself after your procedure. Your health care provider may also give you more specific instructions. Your treatment has been planned according to current medical practices, but problems sometimes occur. Call your health care provider if you have any problems or questions after your procedure. What can I expect after the procedure? After the procedure, it is common to have:  Pain around your incision area.  A small amount of blood or clear fluid coming from your incision.  Follow these instructions at home: Eating and drinking   Follow instructions from your health care provider about eating or drinking restrictions. ? Limit alcohol intake to no more than 1 drink per day for nonpregnant women and 2 drinks per day for men. One drink equals 12 oz of beer, 5 oz of wine, or 1 oz of hard liquor. ? Limit how much caffeine you drink. Caffeine can affect your heart's rate and rhythm.  Drink enough fluid to keep your urine clear or pale yellow.  Eat a heart-healthy diet. This should include plenty of fresh fruits and vegetables. If you eat meat, it should be lean cuts. Avoid foods that are: ? High in salt, saturated fat, or sugar. ? Canned or highly processed. ? Fried. Activity  Return to your normal activities as told by your health care provider. Ask your health care provider what activities are safe for you.  Exercise regularly once you have recovered, as told by your health care provider.  Avoid sitting for more than 2 hours at a time without moving. Get up and move around at least once every 1-2 hours. This helps to prevent blood clots in the legs.  Do not lift anything that is heavier than 10 lb (4.5 kg) until your health care provider approves.  Avoid pushing or pulling things with your arms until your health care provider approves. This  includes pulling on handrails to help you climb stairs. Incision care   Follow instructions from your health care provider about how to take care of your incision. Make sure you: ? Wash your hands with soap and water before you change your bandage (dressing). If soap and water are not available, use hand sanitizer. ? Change your dressing as told by your health care provider. ? Leave stitches (sutures), skin glue, or adhesive strips in place. These skin closures may need to stay in place for 2 weeks or longer. If adhesive strip edges start to loosen and curl up, you may trim the loose edges. Do not remove adhesive strips completely unless your health care provider tells you to do that.  Check your incision area every day for signs of infection. Check for: ? More redness, swelling, or pain. ? More fluid or blood. ? Warmth. ? Pus or a bad smell. Medicines  Take over-the-counter and prescription medicines only as told by your health care provider.  If you were prescribed an antibiotic medicine, take it as told by your health care provider. Do not stop taking the antibiotic even if you start to feel better. Travel  Avoid airplane travel for as long as told by your health care provider.  When you travel, bring a list of your medicines and a record of your medical history with you. Carry your medicines with you. Driving  Ask your health care provider when it is safe for you to drive. Do not drive until your health  care provider approves.  Do not drive or operate heavy machinery while taking prescription pain medicine. Lifestyle   Do not use any tobacco products, such as cigarettes, chewing tobacco, or e-cigarettes. If you need help quitting, ask your health care provider.  Resume sexual activity as told by your health care provider. Do not use medicines for erectile dysfunction unless your health care provider approves, if this applies.  Work with your health care provider to keep your  blood pressure and cholesterol under control, and to manage any other heart conditions that you have.  Maintain a healthy weight. General instructions  Do not take baths, swim, or use a hot tub until your health care provider approves.  Do not strain to have a bowel movement.  Avoid crossing your legs while sitting down.  Check your temperature every day for a fever. A fever may be a sign of infection.  If you are a woman and you plan to become pregnant, talk with your health care provider before you become pregnant.  Wear compression stockings if your health care provider instructs you to do this. These stockings help to prevent blood clots and reduce swelling in your legs.  Tell all health care providers who care for you that you have an artificial (prosthetic) aortic valve. If you have or have had heart disease or endocarditis, tell all health care providers about these conditions as well.  Keep all follow-up visits as told by your health care provider. This is important. Contact a health care provider if:  You develop a skin rash.  You experience sudden, unexplained changes in your weight.  You have more redness, swelling, or pain around your incision.  You have more fluid or blood coming from your incision.  Your incision feels warm to the touch.  You have pus or a bad smell coming from your incision.  You have a fever. Get help right away if:  You develop chest pain that is different from the pain coming from your incision.  You develop shortness of breath or difficulty breathing.  You start to feel light-headed. These symptoms may represent a serious problem that is an emergency. Do not wait to see if the symptoms will go away. Get medical help right away. Call your local emergency services (911 in the [place] Do not drive yourself to the hospital. This information is not intended to replace advice given to you by your health care provider. Make sure you discuss any  questions you have with your health care provider. Document Released: 11/09/2004 Document Revised: 09/29/2015 Document Reviewed: 03/27/2015 Elsevier Interactive Patient Education  2017 [employer].

## 2017-12-09 NOTE — Discharge Summary (Addendum)
Physician Discharge Summary  Patient ID: Whitney [name] MRN: [id number] DOB/AGE: 64-28-1957 64 y.o.  Admit date: 12/05/2017 Discharge date: 12/12/2017  Admission Diagnoses: Severe aortic stenosis  Discharge Diagnoses:  Active Problems:   S/P AVR Brief runs of atrial fibrillation  Patient Active Problem List   Diagnosis Date Noted  . S/P AVR 12/05/2017  . Severe aortic stenosis   . Inguinal hernia 10/22/2016  . Lower extremity edema 06/22/2015  . Aortic valve disorder 04/21/2013  . Edema 04/21/2013  . Depression   . OSA (obstructive sleep apnea)   . Sjogren's syndrome ([place])   . Cirrhosis ([place])   . Hyperlipidemia   . Fibromyalgia   . Postmenopausal bleeding 12/11/2012  . Endometrial polyp 12/11/2012  . Chronic cough 04/11/2011    HPI:  The patient is a 62 year old woman with a history of diabetes, hyperlipidemia, Sjogren's syndrome, thrombocytopenia, obstructive sleep apnea fibromyalgia, NASH followed at [facility] (Dr. Gerald Dexter), rheumatic fever and known severe aortic stenosis, and morbid obesity with significant weight loss after gastric sleeve surgery. She had an echocardiogram in March 2017 which showed a mean gradient across aortic valve of 43 mmHg. Her most recent echocardiogram on 08/08/2017 shows a trileaflet aortic valve with severe thickening and calcification of the leaflets with a mean gradient of 41 mmHg and a peak gradient of 80 mmHg. The DVI was 0.25. There is moderate regurgitation. The mitral valve had a calcified annulus with mildly thickened and calcified leaflets with findings of mild mitral stenosis with a mean gradient of 5 mmHg. Left ventricular systolic function was normal with moderate concentric left ventricular hypertrophy and grade 1 diastolic dysfunction. The patient says that she feels like she is doing well and not really having any symptoms although she does report that her stamina is not as good as it was a year ago and she can only do activities around the house for  about 3 hours before she has done for the day. She denies any substernal chest discomfort. She has had occasional episodes of lightheadedness particularly with position changes. She denies orthopnea and PND. She has had no peripheral edema. Her son and daughter with her today and feel like she is having more symptoms and she admits to.  She is a retired Nature conservation officer. She says that she lost about 100 pounds after her gastric sleeve surgery but gained about 25 pounds since she has retired.   The patient was referred to Gilford Raid, MD who evaluated the patient and studies and recommended aortic valve replacement.  The patient was admitted this hospitalization for the procedure.  Discharged Condition: good  Hospital Course: Patient was admitted electively and taken to the [facility] on 12/05/2017 where she underwent the below described procedure.  She tolerated well was taken to the surgical intensive care unit in stable condition.  Postoperative hospital course:  Patient is done well.  She was extubated without difficulty using standard postop protocols.  She initially did require some low-dose Neo-Synephrine and this was weaned without difficulty.  He has remained hemodynamically stable but did go into atrial fibrillation with a rapid ventricular response.  She was chemically cardioverted with amiodarone and is back in sinus rhythm.  She did have another brief episode of a fib early afternoon on 12/10/2017 and again converted after IV bolus of Amiodarone. Lopressor was increased to 25 mg bid on 08/06. She then remained in sinus rhythm. She has had some postoperative volume overload but is responding well to diuretics.  She does have  an acute blood loss anemia which is stable.  Most recent hemoglobin and hematocrit are 11.3 and 35.7 respectively.  She is required aggressive pulmonary toilet but is responding well.  She does have a chronic thrombocytopenia and platelets are stable at 54,000.  This is  felt to be related to her portal hypertension and NASH/splenomegaly.  Hepatic function panel checked on 08/06 showed LFTs to be "WNL" and bilirubin's slightly elevated. She is making gradual improvement in her activity progression using standard cardiac rehab protocols.  Incisions are healing well without evidence of infection. Epicardial pacing wires were removed on 08/05.  She was back on oxygen this am 08/07. According to documentation, she does desaturate to 88-89% with ambulation. CXR done 08/07 showed persistent left base atelectasis and small effusion, tiny right pleural effusion, and mild interstitial edema. She was observed for 24 more hours. As discussed with Dr. Cyndia Bent, she does not need a left thoracentesis at this time. She was given Zaroxolyn 2.5 mg and Lasix 40 mg daily yesterday. This will be continued for  5 more days (along with a potassium supplement). She will then take Lasix 40 mg daily (along with a potassium supplement) until seen in the office. Chest tube sutures will be removed in the office after discharge.She is felt surgically stable for discharge today.  Consults: None  Significant Diagnostic Studies: Routine postoperative serial chest x-rays and labs.  Treatments: surgery:  12/05/2017 Laren Everts Munoz [id number]  Surgeon:  Gaye Pollack, MD  First Assistant: Nicholes Rough,  PA-C   Preoperative Diagnosis:  Severe aortic stenosis   Postoperative Diagnosis:  Same   Procedure:  1. Median Sternotomy 2. Extracorporeal circulation 3.   Aortic valve replacement using a 21 mm Edwards INSPIRIS RESILIA valve on 12/05/2017.  Anesthesia:  General Endotracheal   Discharge Exam: Blood pressure (!) 108/53, pulse 79, temperature 98.4 F (36.9 C), temperature source Oral, resp. rate (!) 21, height 5' 4"  (1.626 m), weight 103.6 kg, SpO2 92 %.   Cardiovascular: RRR, no murmur Pulmonary: Slightly diminished at bases L>R Abdomen: Soft, non tender, bowel sounds  present. Extremities: No lower extremity edema. Wounds: Clean and dry.  No erythema or signs of infection.   Disposition: Discharge disposition: 01-Home or Self Care     Stable and discharged to home.  Discharge Instructions    Amb Referral to Cardiac Rehabilitation   Complete by:  As directed    Diagnosis:  Valve Replacement   Valve:  Aortic     Allergies as of 12/12/2017      Reactions   Doxycycline Hives, Rash   Naproxen Rash, Hives      Medication List    TAKE these medications   amiodarone 200 MG tablet Commonly known as:  PACERONE Take 1 tablet (200 mg total) by mouth 2 (two) times daily. For one week then take Amiodarone 200 mg daily thereafter   aspirin 81 MG EC tablet Take 1 tablet (81 mg total) by mouth daily.   ezetimibe 10 MG tablet Commonly known as:  ZETIA Take 10 mg by mouth at bedtime.   furosemide 40 MG tablet Commonly known as:  LASIX Take 1 tablet (40 mg total) by mouth daily.   guaiFENesin 600 MG 12 hr tablet Commonly known as:  MUCINEX Take 1 tablet (600 mg total) by mouth 2 (two) times daily as needed for cough or to loosen phlegm.   metolazone 2.5 MG tablet Commonly known as:  ZAROXOLYN Take 1 tablet (2.5 mg total) by mouth  daily. For 5 days then stop.   metoprolol tartrate 25 MG tablet Commonly known as:  LOPRESSOR Take 1 tablet (25 mg total) by mouth 2 (two) times daily.   OVER THE COUNTER MEDICATION Take 1 packet by mouth 2 (two) times daily. Bariatric Multivitamin   oxyCODONE 5 MG immediate release tablet Commonly known as:  Oxy IR/ROXICODONE Take 1 tablet (5 mg total) by mouth every 4 (four) hours as needed for severe pain.   potassium chloride SA 20 MEQ tablet Commonly known as:  K-DUR,KLOR-CON Take 1 tablet (20 mEq total) by mouth daily.      Follow-up Information    Gaye Pollack, MD. Go on 01/08/2018.   Specialty:  Cardiothoracic Surgery Why:  PA/LAT CXR to be taken (at [facility] which is in the same  building as Dr. Vivi Martens office) 30 minutes prior to office appointment;Please see discharge paperwork for follow-up appointment time. Contact information: [place] [address] [phone]        Daune Perch, NP Follow up on 12/30/2017.   Specialty:  Nurse Practitioner Why:  Please arrive 15 minutes early for your 11am post-hospital cardiology appointment Contact information: [name] [address] [phone]        Nurse. Go on 12/17/2017.   Why:  Appointment is with nurse for chest tube suture removal only and appointment time is at 10:00 am Contact information: [address] [place] [address]         The patient has been discharged on:   1.Beta Blocker:  Yes Blue.Reese   ]                              No   [   ]                              If No, reason:  2.Ace Inhibitor/ARB: Yes [   ]                                     No  [ n   ]                                     If No, reason:low BP  3.Statin:   Yes [   ]                  No  [  n ]                  If No, reason:on Zetia,   4.Shela CommonsVelta Addison  [ y  ]                  No   [   ]                  If No, reason:  Signed: Arnoldo Lenis 12/12/2017, 8:26 AM

## 2017-12-09 NOTE — Progress Notes (Signed)
Removed patient pacer wires per provider orders. Patient tolerated removal well. Wires were removed intact and without complication. She is alert and oriented and resting comfortably in bed. Patient will remain on bedrest for one hour.

## 2017-12-09 NOTE — Progress Notes (Signed)
CARDIAC REHAB PHASE I   PRE:  Rate/Rhythm: 106 afib    BP: sitting 114/73    SaO2: 92 RA  MODE:  Ambulation: 470 ft   POST:  Rate/Rhythm: 128 afib    BP: sitting 120/82     SaO2: 84 RA after walk, up to 92 2L with rest  Pt back in afib but unaware. Able to get to EOB with assist with bed pad. Anxious re getting out of bed. Encouraged pain meds as she hasn't had any today. To BR for BM, brushed her teeth then ambulated in hall. Felt slightly off but willing to walk. Fairly steady in hall with RW but tended to veer left and hit objects on left side. Sts she doesn't feel as well today. On return to room, SaO2 84 RA. Now sts she was SOB while walking. HR max was 128 afib, mostly 110-120s. To recliner, fatigued. Encouraged IS and x2 more walks. Left on 2L O2. Will f/u tomorrow. [place], ACSM 12/09/2017 2:09 PM

## 2017-12-09 NOTE — Progress Notes (Addendum)
      [address]       [place] [address]             [phone]        4 Days Post-Op Procedure(s) (LRB): AORTIC VALVE REPLACEMENT (AVR) TISSUE VALVE 21MM INSPIRIS (N/A) TRANSESOPHAGEAL ECHOCARDIOGRAM (TEE) (N/A)  Subjective: Patient passing flatus but no bowel movement yet. She has complaints of increased weight gain and she is also requesting Mucinex this am.  Objective: Vital signs in last 24 hours: Temp:  [98.4 F (36.9 C)-98.6 F (37 C)] 98.6 F (37 C) (08/05 0600) Pulse Rate:  [65-73] 65 (08/05 0600) Cardiac Rhythm: Normal sinus rhythm (08/04 1900) Resp:  [16-22] 22 (08/05 0600) BP: (93-116)/(55-77) 106/67 (08/05 0600) SpO2:  [96 %-100 %] 97 % (08/05 0600) Weight:  [232 lb 6.4 oz (105.4 kg)] 232 lb 6.4 oz (105.4 kg) (08/05 0300)  Pre op weight 107.1 kg Current Weight  12/09/17 232 lb 6.4 oz (105.4 kg)       Intake/Output from previous day: 08/04 0701 - 08/05 0700 In: 480 [P.O.:480] Out: 825 [Urine:825]   Physical Exam:  Cardiovascular: RRR, no murmur Pulmonary: Slightly decreased at bases Abdomen: Soft, non tender, bowel sounds present. Extremities: Mild bilateral lower extremity edema. Wounds: Clean and dry.  No erythema or signs of infection.  Lab Results: CBC: Recent Labs    12/07/17 0308 12/08/17 0211  WBC 9.4 10.0  HGB 11.3* 11.3*  HCT 35.7* 35.7*  PLT 54* 54*   BMET:  Recent Labs    12/07/17 0308 12/08/17 0211  NA 134* 135  K 4.5 4.8  CL 105 101  CO2 23 28  GLUCOSE 145* 123*  BUN 34* 37*  CREATININE 1.27* 0.96  CALCIUM 7.6* 8.1*    PT/INR:  Lab Results  Component Value Date   INR 1.71 12/05/2017   INR 1.28 12/03/2017   INR 1.2 08/20/2017   ABG:  INR: Will add last result for INR, ABG once components are confirmed Will add last 4 CBG results once components are confirmed  Assessment/Plan:  1. CV - Previous a fib with RVR. Maintaining SR on Amiodarone 400 mg bid and Lopressor 12.5 mg bid. 2.   Pulmonary - On 2 liters of oxygen via [place]. Wean to room air as tolerates. Mucinex 600 mg bid. Encourage incentive spirometer 3. Volume Overload - On Lasix 40 mg orally daily but will give IV this am. 4.  Acute blood loss anemia - H and H stable at 11.3 and 35.7 5. Chronic thrombocytopenia-platelets remain 54,000 this am. Etiology secondary to NASH, portal hypertension, and splenomegaly 6. Remove EPW 7. Lactulose for constipation 8. Possible discharge 1-2 days  Donielle M ZimmermanPA-C 12/09/2017,7:18 AM [phone]  Chart reviewed, patient examined, agree with above. She went back into atrial fib with RVR rate 120's this afternoon. Receiving another bolus of amio and continuing 400 bid po. May need to increase Lopressor although BP is low normal so will watch for now.

## 2017-12-09 NOTE — Progress Notes (Signed)
Pt verbalizing concern about weight gain of 15 pounds since her pre-admission visit in office. Pt weight was 217 lbs however this am it is 232lbs. Will report to dayshift RN to discuss with MD during rounds.

## 2017-12-09 NOTE — Progress Notes (Signed)
Patient back in Afib. Notified PA, Donielle and advised to give 150 Amio Bolus and continue with PO Amio. Will administer bolus and continue to monitor patient.

## 2017-12-09 NOTE — Care Management Important Message (Signed)
Important Message  Patient Details  Name: Whitney [name] MRN: [id number] Date of Birth: 1956-02-14   Medicare Important Message Given:  Yes    [name] 12/09/2017, 3:51 PM

## 2017-12-10 LAB — HEPATIC FUNCTION PANEL
ALT: 20 U/L (ref 0–44)
AST: 27 U/L (ref 15–41)
Albumin: 2.6 g/dL — ABNORMAL LOW (ref 3.5–5.0)
Alkaline Phosphatase: 51 U/L (ref 38–126)
BILIRUBIN INDIRECT: 1.1 mg/dL — AB (ref 0.3–0.9)
Bilirubin, Direct: 0.5 mg/dL — ABNORMAL HIGH (ref 0.0–0.2)
TOTAL PROTEIN: 5.3 g/dL — AB (ref 6.5–8.1)
Total Bilirubin: 1.6 mg/dL — ABNORMAL HIGH (ref 0.3–1.2)

## 2017-12-10 LAB — GLUCOSE, CAPILLARY
GLUCOSE-CAPILLARY: 142 mg/dL — AB (ref 70–99)
Glucose-Capillary: 109 mg/dL — ABNORMAL HIGH (ref 70–99)
Glucose-Capillary: 111 mg/dL — ABNORMAL HIGH (ref 70–99)

## 2017-12-10 MED ORDER — METOPROLOL TARTRATE 25 MG PO TABS
25.0000 mg | ORAL_TABLET | Freq: Two times a day (BID) | ORAL | Status: DC
  Administered 2017-12-10 – 2017-12-12 (×4): 25 mg via ORAL
  Filled 2017-12-10 (×5): qty 1

## 2017-12-10 NOTE — Progress Notes (Signed)
[id number] Pt stated she had just taken walk with staff and back in bed now. Encouraged her to walk later and have staff check her sats. She stated she did not walk on oxygen. Reviewed sternal precautions, IS, staying in the tube, ex ed and watching sodium and heart healthy food choices, CPR 2. Referring to [facility] program. Pt thinks she has a walker at home in storage. To have her son check so we can get one for her if she does not have one available. Graylon Good RN BSN 12/10/2017 11:37 AM

## 2017-12-10 NOTE — Progress Notes (Addendum)
      [address]       [place] [address]             [phone]        5 Days Post-Op Procedure(s) (LRB): AORTIC VALVE REPLACEMENT (AVR) TISSUE VALVE 21MM INSPIRIS (N/A) TRANSESOPHAGEAL ECHOCARDIOGRAM (TEE) (N/A)  Subjective: Patient had 2 bowel movement.  Objective: Vital signs in last 24 hours: Temp:  [98 F (36.7 C)-100.1 F (37.8 C)] 98.7 F (37.1 C) (08/06 0514) Pulse Rate:  [74-103] 77 (08/06 0514) Cardiac Rhythm: Normal sinus rhythm (08/05 2126) Resp:  [18-25] 18 (08/06 0514) BP: (102-126)/(63-78) 126/69 (08/06 0514) SpO2:  [92 %-98 %] 95 % (08/05 2126) Weight:  [230 lb 14.4 oz (104.7 kg)] 230 lb 14.4 oz (104.7 kg) (08/06 0514)  Pre op weight 107.1 kg Current Weight  12/10/17 230 lb 14.4 oz (104.7 kg)      Intake/Output from previous day: 08/05 0701 - 08/06 0700 In: 166 [I.V.:166] Out: -    Physical Exam:  Cardiovascular: RRR, no murmur Pulmonary: Slightly decreased at bases Abdomen: Soft, non tender, bowel sounds present. Extremities: Trace bilateral lower extremity edema. Wounds: Clean and dry.  No erythema or signs of infection.  Lab Results: CBC: Recent Labs    12/08/17 0211  WBC 10.0  HGB 11.3*  HCT 35.7*  PLT 54*   BMET:  Recent Labs    12/08/17 0211  NA 135  K 4.8  CL 101  CO2 28  GLUCOSE 123*  BUN 37*  CREATININE 0.96  CALCIUM 8.1*    PT/INR:  Lab Results  Component Value Date   INR 1.71 12/05/2017   INR 1.28 12/03/2017   INR 1.2 08/20/2017   ABG:  INR: Will add last result for INR, ABG once components are confirmed Will add last 4 CBG results once components are confirmed  Assessment/Plan:  1. CV - Previous brief episodes of a fib but appears maintaining SR last 24 hours. She has been given IV boluses of Amiodarone and converts to SR fairly quickly. On Amiodarone 400 mg bid and Lopressor 12.5 mg bid. Lopressor to be increased to 25 mg bid. 2.  Pulmonary - On room air.  Mucinex 600 mg bid.  Encourage incentive spirometer 3. Volume Overload - On Lasix 40 mg orally daily 4.  Acute blood loss anemia - H and H stable at 11.3 and 35.7 5. Chronic thrombocytopenia-platelets remain 54,000 this am. Etiology secondary to NASH, portal hypertension, and splenomegaly 6. Possible discharge on 08/07  [facility] M ZimmermanPA-C 12/10/2017,7:13 AM [phone]

## 2017-12-11 ENCOUNTER — Inpatient Hospital Stay (HOSPITAL_COMMUNITY): Payer: Medicare Other

## 2017-12-11 LAB — GLUCOSE, CAPILLARY
GLUCOSE-CAPILLARY: 161 mg/dL — AB (ref 70–99)
Glucose-Capillary: 114 mg/dL — ABNORMAL HIGH (ref 70–99)
Glucose-Capillary: 166 mg/dL — ABNORMAL HIGH (ref 70–99)

## 2017-12-11 MED ORDER — METOPROLOL TARTRATE 25 MG PO TABS: 25.0000 mg | ORAL_TABLET | Freq: Two times a day (BID) | ORAL | 1 refills | Status: DC

## 2017-12-11 MED ORDER — ASPIRIN 81 MG PO TBEC: 81.0000 mg | DELAYED_RELEASE_TABLET | Freq: Every day | ORAL | Status: DC

## 2017-12-11 MED ORDER — GUAIFENESIN ER 600 MG PO TB12: 600.0000 mg | ORAL_TABLET | Freq: Two times a day (BID) | ORAL | Status: DC | PRN

## 2017-12-11 MED ORDER — AMIODARONE HCL 200 MG PO TABS: 200.0000 mg | ORAL_TABLET | Freq: Two times a day (BID) | ORAL | 1 refills | Status: DC

## 2017-12-11 MED ORDER — OXYCODONE HCL 5 MG PO TABS: 5.0000 mg | ORAL_TABLET | ORAL | 0 refills | Status: DC | PRN

## 2017-12-11 MED ORDER — FUROSEMIDE 40 MG PO TABS: 40.0000 mg | ORAL_TABLET | Freq: Every day | ORAL | 0 refills | Status: DC

## 2017-12-11 MED ORDER — METOLAZONE 5 MG PO TABS
2.5000 mg | ORAL_TABLET | Freq: Once | ORAL | Status: AC
  Administered 2017-12-11: 2.5 mg via ORAL
  Filled 2017-12-11: qty 1

## 2017-12-11 NOTE — Progress Notes (Signed)
CARDIAC REHAB PHASE I   PRE:  Rate/Rhythm: 72 SR with PACs    BP: sitting 111/72    SaO2: 97 1 L, 94 RA  MODE:  Ambulation: 470 ft   POST:  Rate/Rhythm: 80 SR with PACs    BP: sitting to BR     SaO2: 90-91 RA  Pt ambulated off O2 with RW. SAO2 briefly 87-88 RA but quickly up to 90 RA with rest and pursed lip breathing. She did not qualify for O2. This was her third walk today, doing well. To BR after walk, left off O2. Reviewed ed briefly. Eager to d/c. [phone]   Mary[place], ACSM 12/11/2017 11:32 AM

## 2017-12-11 NOTE — Progress Notes (Signed)
Patient desat's with ambulation. Will order PA/LAT CXR and observe until am. Hope to discharge in am.

## 2017-12-11 NOTE — Care Management Note (Signed)
Case Management Note Marvetta Gibbons RN, BSN Unit 4E-Case Manager [phone]  Patient Details  Name: Whitney [name] MRN: [id number] Date of Birth: [birth year]  Subjective/Objective:   Pt admitted s/p AVR               Action/Plan: PTA pt lived at home with son, plan is to return home with son to assist. Spoke with pt and son at bedside- pt has some small stairs at home to navigate - son states he will be there with pt- 2 steps to get into home. Pt has RW at home already. Discussed with pt and son that at pt's current ,mobility level insurance would likely not cover a rehab stay, pt states she wants to return home. Discussed HH with insurance- choice offered if MD orders any type of [facility] services pt open to using any agency- and agreeable to [facility]. Will make referral to [facility] if MD orders [facility] services- pt understands and is ok if [facility] is not ordered. Pt and son request CM to call and speak with daughter- Whitney [name] who is concerned about pt returning home. Call made to [facility] and spoke over TC- discussed concerns about stairs- Whitney [name] states she will be coming by to check on pt and brother daily. Addressed that insurance would likely not cover rehab stay- and that pt declining SNF pt wants to return home. Daughter states she appreciated call and understands pt to return home with brother and will support them as needed at home. CM will continue to follow for transition of care needs  Expected Discharge Date:                  Expected Discharge Plan:  Home/Self Care  In-House Referral:  NA  Discharge planning Services  CM Consult  Post Acute Care Choice:    Choice offered to:     DME Arranged:    DME Agency:     HH Arranged:    [place] Agency:     Status of Service:  Completed, signed off  If discussed at [place] of Stay Meetings, dates discussed:    Discharge Disposition:   Additional Comments:  Dawayne Patricia, RN 12/11/2017, 12:14 PM

## 2017-12-11 NOTE — Progress Notes (Addendum)
      [address]       [place] [address]             [phone]        6 Days Post-Op Procedure(s) (LRB): AORTIC VALVE REPLACEMENT (AVR) TISSUE VALVE 21MM INSPIRIS (N/A) TRANSESOPHAGEAL ECHOCARDIOGRAM (TEE) (N/A)  Subjective: Patient has been intermittently aggravated with some staff and hopes to go home. Son at bedside and all his questions were answered.  Objective: Vital signs in last 24 hours: Temp:  [98.3 F (36.8 C)-98.8 F (37.1 C)] 98.3 F (36.8 C) (08/07 0528) Pulse Rate:  [69-80] 72 (08/07 0528) Cardiac Rhythm: Normal sinus rhythm (08/06 1950) Resp:  [18-27] 27 (08/07 0528) BP: (102-127)/(57-87) 109/57 (08/07 0528) SpO2:  [96 %] 96 % (08/07 0528) Weight:  [236 lb 15.9 oz (107.5 kg)] 236 lb 15.9 oz (107.5 kg) (08/07 0528)  Pre op weight 107.1 kg Current Weight  12/11/17 236 lb 15.9 oz (107.5 kg)        Intake/Output from previous day: 08/06 0701 - 08/07 0700 In: 150 [P.O.:150] Out: -    Physical Exam:  Cardiovascular: RRR, no murmur Pulmonary: Slightly diminished at bases Abdomen: Soft, non tender, bowel sounds present. Extremities: Trace bilateral lower extremity edema. Wounds: Clean and dry.  No erythema or signs of infection.  Lab Results: CBC:No results for input(s): WBC, HGB, HCT, PLT in the last 72 hours. BMET: No results for input(s): NA, K, CL, CO2, GLUCOSE, BUN, CREATININE, CALCIUM in the last 72 hours.  PT/INR:  Lab Results  Component Value Date   INR 1.71 12/05/2017   INR 1.28 12/03/2017   INR 1.2 08/20/2017   ABG:  INR: Will add last result for INR, ABG once components are confirmed Will add last 4 CBG results once components are confirmed  Assessment/Plan: 1. CV - Previous brief episodes of a fib but appears maintaining SR last 24 hours. She has been given IV boluses of Amiodarone and converts to SR fairly quickly. On Amiodarone 400 mg bid and Lopressor 25 mg bid. Will decrease Amiodarone to 200 mg bid. 2.   Pulmonary - On 2 liters of oxygen this am. Asked nurse to document oxygenation on room air and with ambulation.  Mucinex 600 mg bid. Encourage incentive spirometer 3. Volume Overload - On Lasix 40 mg orally daily 4.  Acute blood loss anemia - H and H stable at 11.3 and 35.7 5. Chronic thrombocytopenia-platelets remain 54,000 this am. Etiology secondary to NASH, portal hypertension, and splenomegaly 6. Sutures to remain 7. Probable discharge later today     Sharalyn Ink ZimmermanPA-C 12/11/2017,7:59 AM [phone]   Chart reviewed, patient examined, agree with above. She is still on 2L oxygen. Will need to make sure her sats are 90% or greater on room air before going home. Rhythm seems stable. Possibly home later today after ambulating and checking sats on RA.

## 2017-12-12 ENCOUNTER — Telehealth (HOSPITAL_COMMUNITY): Payer: Self-pay

## 2017-12-12 LAB — BASIC METABOLIC PANEL
Anion gap: 8 (ref 5–15)
BUN: 14 mg/dL (ref 8–23)
CHLORIDE: 101 mmol/L (ref 98–111)
CO2: 30 mmol/L (ref 22–32)
Calcium: 8.2 mg/dL — ABNORMAL LOW (ref 8.9–10.3)
Creatinine, Ser: 0.71 mg/dL (ref 0.44–1.00)
GFR calc Af Amer: >60 mL/min (ref >60)
Glucose, Bld: 108 mg/dL — ABNORMAL HIGH (ref 70–99)
POTASSIUM: 3.5 mmol/L (ref 3.5–5.1)
Sodium: 139 mmol/L (ref 135–145)

## 2017-12-12 LAB — GLUCOSE, CAPILLARY: GLUCOSE-CAPILLARY: 100 mg/dL — AB (ref 70–99)

## 2017-12-12 MED ORDER — POTASSIUM CHLORIDE CRYS ER 20 MEQ PO TBCR: 20.0000 meq | EXTENDED_RELEASE_TABLET | Freq: Every day | ORAL | 0 refills | Status: DC

## 2017-12-12 MED ORDER — METOPROLOL TARTRATE 25 MG PO TABS: 25.0000 mg | ORAL_TABLET | Freq: Two times a day (BID) | ORAL | 1 refills | Status: DC

## 2017-12-12 MED ORDER — FUROSEMIDE 40 MG PO TABS: 40.0000 mg | ORAL_TABLET | Freq: Every day | ORAL | 0 refills | Status: DC

## 2017-12-12 MED ORDER — METOLAZONE 2.5 MG PO TABS: 2.5000 mg | ORAL_TABLET | Freq: Every day | ORAL | 0 refills | Status: DC

## 2017-12-12 MED ORDER — POTASSIUM CHLORIDE CRYS ER 10 MEQ PO TBCR
10.0000 meq | EXTENDED_RELEASE_TABLET | Freq: Every day | ORAL | Status: DC
  Administered 2017-12-12: 10 meq via ORAL
  Filled 2017-12-12: qty 1

## 2017-12-12 MED ORDER — POTASSIUM CHLORIDE CRYS ER 20 MEQ PO TBCR: 20.0000 meq | EXTENDED_RELEASE_TABLET | Freq: Every day | ORAL | Status: DC

## 2017-12-12 MED ORDER — METOLAZONE 5 MG PO TABS
2.5000 mg | ORAL_TABLET | Freq: Every day | ORAL | Status: DC
  Administered 2017-12-12: 2.5 mg via ORAL
  Filled 2017-12-12: qty 1

## 2017-12-12 MED ORDER — POTASSIUM CHLORIDE CRYS ER 20 MEQ PO TBCR
40.0000 meq | EXTENDED_RELEASE_TABLET | Freq: Once | ORAL | Status: AC
  Administered 2017-12-12: 40 meq via ORAL
  Filled 2017-12-12: qty 2

## 2017-12-12 NOTE — Plan of Care (Signed)
Patient is adequate for discharge.

## 2017-12-12 NOTE — Telephone Encounter (Signed)
Pt insurance is active and benefits verified through [place] $20.00, DED $0.00/$0.00 met, out of pocket $3,300.00/$897.41 met, co-insurance 0%. No pre-authorization. Passport, 12/12/17 @ 8:49AM, [id number]  Will contact patient to see if she is interested in the Cardiac Rehab Program. If interested, patient will need to complete follow up appt. Once completed, patient will be contacted for scheduling upon review by the RN Navigator.

## 2017-12-12 NOTE — Telephone Encounter (Signed)
Called patient to see if she is interested in the Cardiac Rehab Program. LM on VM

## 2017-12-12 NOTE — Plan of Care (Signed)
Care plans reviewed and patient is progressing.  

## 2017-12-12 NOTE — Progress Notes (Signed)
Mrs. Wilcher was given discharge instructions.  Discussed medication changes, new medications and prescriptions.  Discussed signs and symptoms to watch for and when to contact the physician.  Discussed follow up appointments and activities.  Verbalized understanding.

## 2017-12-12 NOTE — Care Management Important Message (Signed)
Important Message  Patient Details  Name: Whitney [name] MRN: [id number] Date of Birth: 1955-05-11   Medicare Important Message Given:  Yes    [name] 12/12/2017, 3:43 PM

## 2017-12-12 NOTE — Progress Notes (Addendum)
      [address]       [place] [address]             [phone]        7 Days Post-Op Procedure(s) (LRB): AORTIC VALVE REPLACEMENT (AVR) TISSUE VALVE 21MM INSPIRIS (N/A) TRANSESOPHAGEAL ECHOCARDIOGRAM (TEE) (N/A)  Subjective: Patient had a good night. She hopes to go home today.  Objective: Vital signs in last 24 hours: Temp:  [98.4 F (36.9 C)-98.7 F (37.1 C)] 98.4 F (36.9 C) (08/08 0335) Pulse Rate:  [78-81] 79 (08/08 0335) Cardiac Rhythm: Normal sinus rhythm (08/07 2000) Resp:  [21-26] 21 (08/08 0335) BP: (98-128)/(53-65) 108/53 (08/08 0335) SpO2:  [92 %-96 %] 92 % (08/07 2124) Weight:  [103.6 kg-104.3 kg] 103.6 kg (08/08 0335)  Pre op weight 107.1 kg Current Weight  12/12/17 103.6 kg       Intake/Output from previous day: No intake/output data recorded.   Physical Exam:  Cardiovascular: RRR, no murmur Pulmonary: Slightly diminished at bases L>R Abdomen: Soft, non tender, bowel sounds present. Extremities: No lower extremity edema. Wounds: Clean and dry.  No erythema or signs of infection.  Lab Results: CBC:No results for input(s): WBC, HGB, HCT, PLT in the last 72 hours. BMET:  Recent Labs    12/12/17 0310  NA 139  K 3.5  CL 101  CO2 30  GLUCOSE 108*  BUN 14  CREATININE 0.71  CALCIUM 8.2*    PT/INR:  Lab Results  Component Value Date   INR 1.71 12/05/2017   INR 1.28 12/03/2017   INR 1.2 08/20/2017   ABG:  INR: Will add last result for INR, ABG once components are confirmed Will add last 4 CBG results once components are confirmed  Assessment/Plan: 1. CV - Previous brief episodes of a fib but appears maintaining SR last 48 hours. She has been given IV boluses of Amiodarone and converts to SR fairly quickly. On Amiodarone 400 mg bid and Lopressor 25 mg bid. Will decrease Amiodarone to 200 mg bid. 2.  Pulmonary - On room air. CXR done yesterday afternoon showed no pneumothorax, left base atelectasis and small  pleural effusion, mild interstitial edema, and stable cardiomegaly. Mucinex 600 mg bid. Encourage incentive spirometer 3. Volume Overload - Will continue Zaroxolyn 2.5 mg  and Lasix 40 mg daily for next 5 days then Lasix only daily thereafter 4.  Acute blood loss anemia - H and H stable at 11.3 and 35.7 5. Chronic thrombocytopenia-platelets remain 54,000 this am. Etiology secondary to NASH, portal hypertension, and splenomegaly 6. Sutures to remain 7. Supplement potassium 8. Probable discharge later today     Donielle M ZimmermanPA-C 12/12/2017,7:12 AM [phone]  .

## 2017-12-17 ENCOUNTER — Ambulatory Visit (INDEPENDENT_AMBULATORY_CARE_PROVIDER_SITE_OTHER): Payer: Self-pay | Admitting: *Deleted

## 2017-12-17 DIAGNOSIS — Z952 Presence of prosthetic heart valve: Secondary | ICD-10-CM

## 2017-12-17 DIAGNOSIS — Z4802 Encounter for removal of sutures: Secondary | ICD-10-CM

## 2017-12-17 NOTE — Progress Notes (Signed)
Whitney [name] returns s/p AVR 12/05/17 with discharge on 12/12/17. She is doing well at home. Her main complaint is buttock discomfort. There are no sores or obvious bruising.  Appetite is normal. She has not had a bm since 12/10/17. I encouraged her to start Colace. Her incisions are very well healed. I removed sutures from the previous three chest tube sites. She is using pain med minimally. She will return as scheduled with a CXR.

## 2017-12-30 ENCOUNTER — Encounter: Payer: Self-pay | Admitting: Cardiology

## 2017-12-30 ENCOUNTER — Ambulatory Visit: Payer: Medicare Other | Admitting: Cardiology

## 2017-12-30 VITALS — BP 120/70 | HR 62 | Ht 64.0 in | Wt 209.4 lb

## 2017-12-30 DIAGNOSIS — E785 Hyperlipidemia, unspecified: Secondary | ICD-10-CM | POA: Diagnosis not present

## 2017-12-30 DIAGNOSIS — I480 Paroxysmal atrial fibrillation: Secondary | ICD-10-CM

## 2017-12-30 DIAGNOSIS — Z953 Presence of xenogenic heart valve: Secondary | ICD-10-CM

## 2017-12-30 DIAGNOSIS — D696 Thrombocytopenia, unspecified: Secondary | ICD-10-CM | POA: Diagnosis not present

## 2017-12-30 LAB — BASIC METABOLIC PANEL
BUN / CREAT RATIO: 13 (ref 12–28)
BUN: 13 mg/dL (ref 8–27)
CHLORIDE: 98 mmol/L (ref 96–106)
CO2: 30 mmol/L — ABNORMAL HIGH (ref 20–29)
Calcium: 8.9 mg/dL (ref 8.7–10.3)
Creatinine, Ser: 0.98 mg/dL (ref 0.57–1.00)
GFR, EST AFRICAN AMERICAN: 71 mL/min/{1.73_m2} (ref >59)
GFR, EST NON AFRICAN AMERICAN: 61 mL/min/{1.73_m2} (ref >59)
Glucose: 98 mg/dL (ref 65–99)
Potassium: 3.4 mmol/L — ABNORMAL LOW (ref 3.5–5.2)
Sodium: 142 mmol/L (ref 134–144)

## 2017-12-30 NOTE — Progress Notes (Signed)
Cardiology Office Note:    Date:  12/30/2017   ID:  Whitney [name], DOB 26-May-1955, MRN [id number]  PCP:  Hulan Fess, MD  Cardiologist:  Larae Grooms, MD  Referring MD: Hulan Fess, MD   Chief Complaint  Patient presents with  . Hospitalization Follow-up    Post AVR    History of Present Illness:    Whitney [name] is a 62 y.o. female with a past medical history significant for NASH and chronic thrombocytopenia treated at [facility], gastric sleeve surgery 2015 with 100 lb wt loss, aortic stenosis s/p tissue AVR 12/05/2017, DM type 2, hyperlipidemia.  Ms Guizar had known aortic stenosis that had progressed with symptoms of exertional fatigue and tiredness.  Echocardiogram in April 2019 showed a mean gradient of 41 mmHg and a peak gradient of 80 mmHg.  LV systolic function was normal.  She was evaluated by her liver specialist who felt there was no contraindication to proceeding with an aortic valve replacement.  She underwent tissue AVR on 12/05/2017.  She developed brief runs of atrial fibrillation postoperatively.  She chemically converted back to sinus rhythm with amiodarone and Lopressor was increased to 25 mg twice daily.  She had postoperative volume overload which is improved.  She has chronic thrombocytopenia and platelets were stable at 54,000 in the hospital.  Ms. Bier is here today for follow up after her AVR surgery with her son. She has been recovering well, slowly increasing her activity. She has been working around the house and shopping although not doing the walking that was prescribed. She still has some pain in the right chest wall. Breathing has been improving. No edema and wt is down from 228 lbs to 209 lbs.   Past Medical History:  Diagnosis Date  . Allergic rhinitis   . Anemia    hx  . Arthritis   . Asthma    hx yrs ago  . Cirrhosis ([place]) last albumin 3.3 done at [facility] 06-16-2014 (under care everywhere tab in epic)   Secondary to Fatty liver --  followed by  hepatology at [facility] (dr Gerald Dexter)   . Depression   . Diabetes mellitus type II    type 2 diet conrolled  . Dyspnea   . Fibromyalgia   . GERD (gastroesophageal reflux disease)   . Heart murmur    asymptomatic ---  1989 from rhuematic fever  . History of exercise stress test    05-05-2013---  negative bruce ETT given exercise workload,  no ischemia  . History of hiatal hernia   . History of kidney stones   . History of rheumatic fever    1989  . Hyperlipidemia   . Leukocytopenia   . Moderate aortic stenosis    AVA area 1.1cm2---  cardiologist --  dr Concepcion Living, 2014 in epic  . NASH (nonalcoholic steatohepatitis)   . OSA (obstructive sleep apnea)    was using CPAP before gastric sleeve 2015--  no uses after wt loss  . Pneumonia    hx  . Sjogren's syndrome ([place])   . Thrombocytopenia ([place])     Past Surgical History:  Procedure Laterality Date  . AORTIC VALVE REPLACEMENT N/A 12/05/2017   Procedure: AORTIC VALVE REPLACEMENT (AVR) TISSUE VALVE 21MM INSPIRIS;  Surgeon: Gaye Pollack, MD;  Location: [place];  Service: Open Heart Surgery;  Laterality: N/A;  . COLONOSCOPY WITH ESOPHAGOGASTRODUODENOSCOPY (EGD)    . CYSTOSCOPY WITH RETROGRADE PYELOGRAM, URETEROSCOPY AND STENT PLACEMENT Left 01/21/2015   Procedure: CYSTOSCOPY WITH  LEFT  RETROGRADE PYELOGRAM, LEFT URETEROSCOPY AND STENT PLACEMENT;  Surgeon: Festus Aloe, MD;  Location: [facility];  Service: Urology;  Laterality: Left;  . CYSTOSCOPY WITH RETROGRADE PYELOGRAM, URETEROSCOPY AND STENT PLACEMENT Bilateral 02/24/2015   Procedure: CYSTOSCOPY WITH RIGHT RETROGRADE PYELOGRAM, BLADDER BIOPSY FULGERATION LEFT URETEROSCOPY AND STENT REPLACEMENT;  Surgeon: Festus Aloe, MD;  Location: [facility];  Service: Urology;  Laterality: Bilateral;  . EXPLORATORY LAPAROSCOPY W/  CONE BIOPSY'S LEFT AND RIGHT LOBE OF LIVER  11-04-2007  . HOLMIUM LASER APPLICATION Left [id number]/56/3875   Procedure: HOLMIUM LASER  LITHOTRIPSY;  Surgeon: Festus Aloe, MD;  Location: [facility];  Service: Urology;  Laterality: Left;  . HYSTEROSCOPY W/D&C N/A 12/11/2012   Procedure: DILATATION AND CURETTAGE /HYSTEROSCOPY;  Surgeon: Maeola Sarah. Landry Mellow, MD;  Location: [facility];  Service: Gynecology;  Laterality: N/A;  . INGUINAL HERNIA REPAIR Left 10/22/2016   Procedure: LEFT INGUINAL HERNIA REPAIR;  Surgeon: Rolm Bookbinder, MD;  Location: [place];  Service: General;  Laterality: Left;  TAP BLOCK  . INSERTION OF MESH Left 10/22/2016   Procedure: INSERTION OF MESH;  Surgeon: Rolm Bookbinder, MD;  Location: [place];  Service: General;  Laterality: Left;  . LAPAROSCOPIC GASTRIC SLEEVE RESECTION  07-27-2013  . PUBOVAGINAL SLING  04-10-2001   [place]  . RIGHT/LEFT HEART CATH AND CORONARY ANGIOGRAPHY N/A 08/23/2017   Procedure: RIGHT/LEFT HEART CATH AND CORONARY ANGIOGRAPHY;  Surgeon: [name], MD;  Location: [place] [facility];  Service: Cardiovascular;  Laterality: N/A;  . TEE WITHOUT CARDIOVERSION N/A 12/05/2017   Procedure: TRANSESOPHAGEAL ECHOCARDIOGRAM (TEE);  Surgeon: Gaye Pollack, MD;  Location: [place];  Service: Open Heart Surgery;  Laterality: N/A;  . TONSILLECTOMY  1975  . TRANSTHORACIC ECHOCARDIOGRAM  06-04-2012  dr Irish Lack   mild LVH,  grade I diastolic dysfunction/  ef 60-65%/  moderate LAE/  mild MV calcifation without stenosis,  mild MR/  moderate AV stenosis,  cannot r/o bicupsid, area 1.1cm2/  mild dilated aortic root/  trivial TR    Current Medications: Current Meds  Medication Sig  . amiodarone (PACERONE) 200 MG tablet Take 1 tablet (200 mg total) by mouth 2 (two) times daily. For one week then take Amiodarone 200 mg daily thereafter  . aspirin EC 81 MG EC tablet Take 1 tablet (81 mg total) by mouth daily.  Marland Kitchen ezetimibe (ZETIA) 10 MG tablet Take 10 mg by mouth at bedtime.   . furosemide (LASIX) 40 MG tablet Take 1 tablet (40 mg total) by mouth daily.  Marland Kitchen guaiFENesin (MUCINEX) 600 MG 12 hr  tablet Take 1 tablet (600 mg total) by mouth 2 (two) times daily as needed for cough or to loosen phlegm.  . metoprolol tartrate (LOPRESSOR) 25 MG tablet Take 1 tablet (25 mg total) by mouth 2 (two) times daily.  Marland Kitchen OVER THE COUNTER MEDICATION Take 1 packet by mouth 2 (two) times daily. Bariatric Multivitamin  . oxyCODONE (OXY IR/ROXICODONE) 5 MG immediate release tablet Take 1 tablet (5 mg total) by mouth every 4 (four) hours as needed for severe pain.  . potassium chloride SA (K-DUR,KLOR-CON) 20 MEQ tablet Take 1 tablet (20 mEq total) by mouth daily.     Allergies:   Doxycycline and Naproxen   Social History   Socioeconomic History  . Marital status: Married    Spouse name: Married to [employer]  . Number of children: Not on file  . Years of education: Not on file  . Highest education level:  Not on file  Occupational History  . Occupation: principal of [employer]  Social Needs  . Financial resource strain: Not on file  . Food insecurity:    Worry: Not on file    Inability: Not on file  . Transportation needs:    Medical: Not on file    Non-medical: Not on file  Tobacco Use  . Smoking status: Never Smoker  . Smokeless tobacco: Never Used  Substance and Sexual Activity  . Alcohol use: Yes    Comment: rarely  . Drug use: No  . Sexual activity: Not on file  Lifestyle  . Physical activity:    Days per week: Not on file    Minutes per session: Not on file  . Stress: Not on file  Relationships  . Social connections:    Talks on phone: Not on file    Gets together: Not on file    Attends religious service: Not on file    Active member of club or organization: Not on file    Attends meetings of clubs or organizations: Not on file    Relationship status: Not on file  Other Topics Concern  . Not on file  Social History Narrative  . Not on file     Family History: The patient's family history includes Allergies in her daughter; Alzheimer's disease in her father; Asthma in  her daughter, father, and paternal grandmother; Heart attack in her father; Heart disease in her father and mother; Hip fracture in her father; Hypertension in her father; Rheum arthritis in her mother. There is no history of Stroke. ROS:   Please see the history of present illness.     All other systems reviewed and are negative.  EKGs/Labs/Other Studies Reviewed:    The following studies were reviewed today:  Intraoperative TEE 12/05/2017  Aortic valve: The valve is trileaflet and severely thickened with significant calcification. R & Non coronary cusp appear immobile. L coronary cusp display significantly reduced leaflet motion. Severe stenosis. Mean gradient 30-77mhg. Moderate AI  Mitral valve: Moderate leaflet calcification noted with reduced motion of the posterior leaflet. No prolapse noted. Mild regurgitation.  Right ventricle: Normal cavity size, wall thickness and ejection fraction.  Tricuspid valve: Mild regurgitation. Regurgitant jet is central.  Pericardium: No pericardial effusion.  Left ventricle: Normal cavity size. Normal LVEF. Moderate concentric LVH. No significant regional wall motion abnormalities.  Pulmonic valve: Normal leaflet motion. No insuffiency noted.  Left Atrial Appendage: No thrombus present.   Post Bypass TEE: Tricuspid, Pulmonic and Mitral Valve unchanged. Normal LVEF, no regional motion abnormalities. Aortic valve replaced with 21 mm tissue valve, seated well, no rocking, no perivalvular leaks noted (Mean gradient: 12 mmhg). No dissection noted after cannula removal.   EKG:  EKG is not ordered today.    Recent Labs: 12/06/2017: Magnesium 2.4 12/08/2017: Hemoglobin 11.3; Platelets 54 12/10/2017: ALT 20 12/12/2017: BUN 14; Creatinine, Ser 0.71; Potassium 3.5; Sodium 139   Recent Lipid Panel No results found for: CHOL, TRIG, HDL, CHOLHDL, VLDL, LDLCALC, LDLDIRECT  Physical Exam:    VS:  BP 120/70   Pulse 62   Ht 5' 4"  (1.626 m)   Wt 209 lb 6.4 oz  (95 kg)   SpO2 98%   BMI 35.94 kg/m     Wt Readings from Last 3 Encounters:  12/30/17 209 lb 6.4 oz (95 kg)  12/12/17 228 lb 6.3 oz (103.6 kg)  12/03/17 217 lb 8 oz (98.7 kg)     Physical Exam  Constitutional: She is oriented to person, place, and time. She appears well-developed and well-nourished. No distress.  HENT:  Head: Normocephalic and atraumatic.  Neck: Normal range of motion. Neck supple. No JVD present.  Cardiovascular: Normal rate, regular rhythm and intact distal pulses. Exam reveals no gallop and no friction rub.  Murmur heard.  Systolic murmur is present with a grade of 2/6 at the upper right sternal border. Pulmonary/Chest: Effort normal and breath sounds normal. No respiratory distress. She has no wheezes. She has no rales.  Slightly diminished in right base.   Abdominal: Soft. Bowel sounds are normal. She exhibits no distension.  Musculoskeletal: Normal range of motion. She exhibits no edema or deformity.  Neurological: She is alert and oriented to person, place, and time.  Skin: Skin is warm and dry.  Psychiatric: She has a normal mood and affect. Her behavior is normal. Judgment and thought content normal.  Vitals reviewed.    ASSESSMENT:    1. S/P aortic valve replacement with bioprosthetic valve   2. Paroxysmal atrial fibrillation (HCC)   3. Thrombocytopenia ([place])   4. Hyperlipidemia, unspecified hyperlipidemia type    PLAN:    In order of problems listed above:  S/P tissue AVR 12/05/2017: Recovering well. Advised to continue to do deep breathing as slightly diminished breath sound on the right. Will continue Lasix 40 mg daily. Wt down from 228 to 209. May be able to eventually reduce or discontinue diuretic. Pt has surgery follow up appt with Dr. Cyndia Bent on 01/08/2018. Advised to do the walking  Program as prescribed, stating with 5 minutes and building up to 30 minutes daily.   Paroxysmal atrial fibrillation: Postoperatively.  Converted to sinus rhythm  with amiodarone, currently on 200 mg daily.  Also on metoprolol tartrate 25 mg twice daily. Regular rate and rhythm on exam. No anticoagulation due to liver disease. No palpitations. Will hopefully be able to stop amiodarone at next visit if no recurrent afib.   Chronic thrombocytopenia: Related to portal hypertension [place]/splenomegaly.  Platelet count 54,000 in the hospital, was stable.   Hyperlipidemia: On zetia 10 mg daily. Not on statin due to NASH. LDL 118, HDL 50. Continue current therapy.   Medication Adjustments/Labs and Tests Ordered: Current medicines are reviewed at length with the patient today.  Concerns regarding medicines are outlined above. Labs and tests ordered and medication changes are outlined in the patient instructions below:  Patient Instructions  Medication Instructions: Your physician recommends that you continue on your current medications as directed. Please refer to the Current Medication list given to you today.   Labwork: TODAY: BMET  Procedures/Testing: None Ordered  Follow-Up: Your physician recommends that you schedule a follow-up appointment in: 1 month with Dr.Varanasi      Any Additional Special Instructions Will Be Listed Below (If Applicable).     If you need a refill on your cardiac medications before your next appointment, please call your pharmacy.      Signed, Daune Perch, NP  12/30/2017 1:21 PM    [facility]

## 2017-12-30 NOTE — Patient Instructions (Addendum)
Medication Instructions: Your physician recommends that you continue on your current medications as directed. Please refer to the Current Medication list given to you today.   Labwork: TODAY: BMET  Procedures/Testing: None Ordered  Follow-Up: Your physician recommends that you schedule a follow-up appointment in: 1 month with Dr.Varanasi      Any Additional Special Instructions Will Be Listed Below (If Applicable).     If you need a refill on your cardiac medications before your next appointment, please call your pharmacy.

## 2017-12-31 ENCOUNTER — Telehealth (HOSPITAL_COMMUNITY): Payer: Self-pay

## 2017-12-31 NOTE — Telephone Encounter (Signed)
Called patient to see if she was interested in participating in the Cardiac Rehab Program. Patient stated yes. Patient will come in for orientation on 02/06/18 @ 1:30PM and will attend the 11:15AM exercise class.  Mailed homework package.  Went over insurance, patient verbalized understanding.

## 2018-01-03 ENCOUNTER — Other Ambulatory Visit: Payer: Self-pay | Admitting: Physician Assistant

## 2018-01-07 ENCOUNTER — Other Ambulatory Visit: Payer: Self-pay | Admitting: Surgery

## 2018-01-07 ENCOUNTER — Other Ambulatory Visit: Payer: Self-pay | Admitting: Physician Assistant

## 2018-01-07 DIAGNOSIS — Z952 Presence of prosthetic heart valve: Secondary | ICD-10-CM

## 2018-01-08 ENCOUNTER — Other Ambulatory Visit: Payer: Self-pay

## 2018-01-08 ENCOUNTER — Encounter: Payer: Self-pay | Admitting: Surgery

## 2018-01-08 ENCOUNTER — Ambulatory Visit
Admission: RE | Admit: 2018-01-08 | Discharge: 2018-01-08 | Disposition: A | Discharge status: Home / Self Care | Payer: Medicare Other | Source: Ambulatory Visit | Attending: Surgery | Admitting: Surgery

## 2018-01-08 ENCOUNTER — Ambulatory Visit (INDEPENDENT_AMBULATORY_CARE_PROVIDER_SITE_OTHER): Payer: Self-pay | Admitting: Surgery

## 2018-01-08 VITALS — BP 108/72 | HR 60 | Resp 16 | Ht 64.0 in | Wt 206.2 lb

## 2018-01-08 DIAGNOSIS — Z952 Presence of prosthetic heart valve: Secondary | ICD-10-CM

## 2018-01-08 NOTE — Progress Notes (Signed)
HPI:  The patient returns for routine follow-up status post aortic valve replacement using a 21 mm Edwards INSPIRIS RESILIA pericardial valve on 12/05/2017.  Her postoperative course was complicated by development of postoperative atrial fibrillation that was converted with amiodarone.  She also had mild volume overload requiring Lasix and potassium at discharge.  Since going home she said that she has been out walking but her stamina is not back to normal.  She denies any chest pain or shortness of breath.  She has had no peripheral edema.  She has had periods when she felt a little dizzy when she was up walking.  Current Outpatient Medications  Medication Sig Dispense Refill  . amiodarone (PACERONE) 200 MG tablet Take 1 tablet (200 mg total) by mouth 2 (two) times daily. For one week then take Amiodarone 200 mg daily thereafter 60 tablet 1  . aspirin EC 81 MG EC tablet Take 1 tablet (81 mg total) by mouth daily.    Marland Kitchen ezetimibe (ZETIA) 10 MG tablet Take 10 mg by mouth at bedtime.   11  . furosemide (LASIX) 40 MG tablet Take 1 tablet (40 mg total) by mouth daily. 30 tablet 0  . metoprolol tartrate (LOPRESSOR) 25 MG tablet Take 1 tablet (25 mg total) by mouth 2 (two) times daily. 60 tablet 1  . OVER THE COUNTER MEDICATION Take 1 packet by mouth 2 (two) times daily. Bariatric Multivitamin    . oxyCODONE (OXY IR/ROXICODONE) 5 MG immediate release tablet Take 1 tablet (5 mg total) by mouth every 4 (four) hours as needed for severe pain. 30 tablet 0  . potassium chloride SA (K-DUR,KLOR-CON) 20 MEQ tablet Take 1 tablet (20 mEq total) by mouth daily. (Patient taking differently: Take 30 mEq by mouth. ) 30 tablet 0   No current facility-administered medications for this visit.      Physical Exam: BP 108/72 (BP Location: Right Arm, Patient Position: Sitting, Cuff Size: Large)   Pulse 60   Resp 16   Ht 5' 4"  (1.626 m)   Wt 206 lb 3.2 oz (93.5 kg)   SpO2 95% Comment: ON RA  BMI 35.39 kg/m  She  looks well. Cardiac exam shows a regular rate and rhythm with normal valve sounds and a 1 out of 6 systolic flow murmur across the aortic valve prosthesis.  There is no diastolic murmur. Lungs are clear. Chest incision is healing well and sternum stable. There is no peripheral edema.  Diagnostic Tests:  CLINICAL DATA:  S/p AVR 12/05/17 / pt stated she feels good, just tired and a little sore / h/o heart murmur, hiatal hernia, mosrate aortic stenosis, PNA / non-smoker  EXAM: CHEST - 2 VIEW  COMPARISON:  12/11/2017  FINDINGS: Since the prior exam, the bilateral pleural effusions have resolved. There is minor residual subsegmental atelectasis at the left lateral lung base. The lungs are otherwise clear.  No pneumothorax.  Cardiac silhouette is top-normal in size. Changes from the recent cardiac surgery and aortic valve replacement are stable. No mediastinal widening, masses or adenopathy.  Mild wedge-shaped compression deformity L1, stable from the prior exams.  IMPRESSION: 1. Resolved pleural effusions with only minimal residual atelectasis at the left lateral lung base. 2. No acute cardiopulmonary disease.   Electronically Signed   By: Lajean Manes M.D.   On: 01/08/2018 12:42   Impression:  Overall I think she is making good progress following aortic valve replacement surgery.  She is only 5 weeks postoperatively so I  would not expect her stamina to be back to normal.  She appears to be maintaining sinus rhythm and I told her that she could stop the amiodarone now.  She has no peripheral edema and her pleural effusions have resolved on chest x-ray so I will also stop her Lasix and potassium.  She is still on Lopressor 25 mg twice daily and asked her to continue that for now.  Her heart rate and blood pressure on the low normal side so she may need to have that dose cut back in the future but I will leave that decision up to Dr. Irish Lack when he sees her in a few  weeks.  I encouraged her to continue increasing the distance she is walking.  Plan:  She will continue to follow-up with Dr. Irish Lack and I will see her back in about 4 weeks for follow-up.   Gaye Pollack, MD [facility] [phone]

## 2018-01-09 ENCOUNTER — Encounter: Payer: Self-pay | Admitting: Cardiology

## 2018-01-17 ENCOUNTER — Telehealth (HOSPITAL_COMMUNITY): Payer: Self-pay

## 2018-01-17 NOTE — Telephone Encounter (Signed)
Attempted to contact patient to see if she can move orientation time to morning - lm on vm

## 2018-01-24 ENCOUNTER — Encounter: Payer: Self-pay | Admitting: Cardiology

## 2018-01-24 ENCOUNTER — Encounter

## 2018-01-24 ENCOUNTER — Ambulatory Visit (INDEPENDENT_AMBULATORY_CARE_PROVIDER_SITE_OTHER): Payer: Medicare Other | Admitting: Cardiology

## 2018-01-24 VITALS — BP 116/68 | HR 67 | Ht 64.0 in | Wt 215.0 lb

## 2018-01-24 DIAGNOSIS — I480 Paroxysmal atrial fibrillation: Secondary | ICD-10-CM

## 2018-01-24 DIAGNOSIS — E785 Hyperlipidemia, unspecified: Secondary | ICD-10-CM

## 2018-01-24 DIAGNOSIS — R5383 Other fatigue: Secondary | ICD-10-CM | POA: Diagnosis not present

## 2018-01-24 DIAGNOSIS — E876 Hypokalemia: Secondary | ICD-10-CM | POA: Diagnosis not present

## 2018-01-24 NOTE — Progress Notes (Signed)
Cardiology Office Note:    Date:  01/24/2018   ID:  Whitney [name], DOB 09-10-1955, MRN [id number]  PCP:  Hulan Fess, MD  Cardiologist:  Larae Grooms, MD  Referring MD: Hulan Fess, MD   Chief Complaint  Patient presents with  . Follow-up    AVR    History of Present Illness:    Whitney [name] is a 62 y.o. female with a past medical history significant for NASH and chronic thrombocytopenia treated at [facility], gastric sleeve surgery 2015 with 100 lb wt loss, aortic stenosis s/p tissue AVR 12/05/2017, DM type 2, hyperlipidemia.  I saw her in follow-up after her AVR surgery at which time she was doing well.  She had no apparent recurrence of atrial fibrillation. She had follow-up with Dr. Cyndia Bent on 01/08/2018 at which time she was doing fairly well.  Her stamina was not back to normal yet but it is still early.  He stopped her amiodarone as she had no apparent recurrence of atrial fibrillation.  Her heart rate and blood pressure one on the low side and he recommended follow-up with cardiology for possible reduction in medications.  She is here today for follow-up alone. She complains of extreme fatigue since her surgery and does not seem to be getting much better over time as expected. SHe is haivng to take naps during the day. She is walking for about 15-30 minutes only 3 times per week. No chest discomfort during the day but is still uncomfortable at the surgical site when she lays down to sleep. She still gets short of breath with over exertion, only very occasionally. She takes a few deep breaths and she feels ok.   She has been very hungry and is up eating during the night. She has gained 9 pounds. She has not been taking her vitamins that she has been on since the gastric sleeve.   Past Medical History:  Diagnosis Date  . Allergic rhinitis   . Anemia    hx  . Arthritis   . Asthma    hx yrs ago  . Cirrhosis ([place]) last albumin 3.3 done at [facility] 06-16-2014 (under care everywhere  tab in epic)   Secondary to Fatty liver --  followed by hepatology at [facility] (dr Gerald Dexter)   . Depression   . Diabetes mellitus type II    type 2 diet conrolled  . Dyspnea   . Fibromyalgia   . GERD (gastroesophageal reflux disease)   . Heart murmur    asymptomatic ---  1989 from rhuematic fever  . History of exercise stress test    05-05-2013---  negative bruce ETT given exercise workload,  no ischemia  . History of hiatal hernia   . History of kidney stones   . History of rheumatic fever    1989  . Hyperlipidemia   . Leukocytopenia   . Moderate aortic stenosis    AVA area 1.1cm2---  cardiologist --  dr Concepcion Living, 2014 in epic  . NASH (nonalcoholic steatohepatitis)   . OSA (obstructive sleep apnea)    was using CPAP before gastric sleeve 2015--  no uses after wt loss  . Pneumonia    hx  . Sjogren's syndrome ([place])   . Thrombocytopenia ([place])     Past Surgical History:  Procedure Laterality Date  . AORTIC VALVE REPLACEMENT N/A 12/05/2017   Procedure: AORTIC VALVE REPLACEMENT (AVR) TISSUE VALVE 21MM INSPIRIS;  Surgeon: Gaye Pollack, MD;  Location: [place];  Service: Open Heart  Surgery;  Laterality: N/A;  . COLONOSCOPY WITH ESOPHAGOGASTRODUODENOSCOPY (EGD)    . CYSTOSCOPY WITH RETROGRADE PYELOGRAM, URETEROSCOPY AND STENT PLACEMENT Left 01/21/2015   Procedure: CYSTOSCOPY WITH LEFT  RETROGRADE PYELOGRAM, LEFT URETEROSCOPY AND STENT PLACEMENT;  Surgeon: Festus Aloe, MD;  Location: [facility];  Service: Urology;  Laterality: Left;  . CYSTOSCOPY WITH RETROGRADE PYELOGRAM, URETEROSCOPY AND STENT PLACEMENT Bilateral 02/24/2015   Procedure: CYSTOSCOPY WITH RIGHT RETROGRADE PYELOGRAM, BLADDER BIOPSY FULGERATION LEFT URETEROSCOPY AND STENT REPLACEMENT;  Surgeon: Festus Aloe, MD;  Location: [facility];  Service: Urology;  Laterality: Bilateral;  . EXPLORATORY LAPAROSCOPY W/  CONE BIOPSY'S LEFT AND RIGHT LOBE OF LIVER  11-04-2007  . HOLMIUM LASER  APPLICATION Left 16/96/7893   Procedure: HOLMIUM LASER LITHOTRIPSY;  Surgeon: Festus Aloe, MD;  Location: [facility];  Service: Urology;  Laterality: Left;  . HYSTEROSCOPY W/D&C N/A 12/11/2012   Procedure: DILATATION AND CURETTAGE /HYSTEROSCOPY;  Surgeon: Maeola Sarah. Landry Mellow, MD;  Location: [facility];  Service: Gynecology;  Laterality: N/A;  . INGUINAL HERNIA REPAIR Left 10/22/2016   Procedure: LEFT INGUINAL HERNIA REPAIR;  Surgeon: Rolm Bookbinder, MD;  Location: [place];  Service: General;  Laterality: Left;  TAP BLOCK  . INSERTION OF MESH Left 10/22/2016   Procedure: INSERTION OF MESH;  Surgeon: Rolm Bookbinder, MD;  Location: [place];  Service: General;  Laterality: Left;  . LAPAROSCOPIC GASTRIC SLEEVE RESECTION  07-27-2013  . PUBOVAGINAL SLING  04-10-2001   [place]  . RIGHT/LEFT HEART CATH AND CORONARY ANGIOGRAPHY N/A 08/23/2017   Procedure: RIGHT/LEFT HEART CATH AND CORONARY ANGIOGRAPHY;  Surgeon: [name], MD;  Location: [facility];  Service: Cardiovascular;  Laterality: N/A;  . TEE WITHOUT CARDIOVERSION N/A 12/05/2017   Procedure: TRANSESOPHAGEAL ECHOCARDIOGRAM (TEE);  Surgeon: Gaye Pollack, MD;  Location: [place];  Service: Open Heart Surgery;  Laterality: N/A;  . TONSILLECTOMY  1975  . TRANSTHORACIC ECHOCARDIOGRAM  06-04-2012  dr Irish Lack   mild LVH,  grade I diastolic dysfunction/  ef 60-65%/  moderate LAE/  mild MV calcifation without stenosis,  mild MR/  moderate AV stenosis,  cannot r/o bicupsid, area 1.1cm2/  mild dilated aortic root/  trivial TR    Current Medications: Current Meds  Medication Sig  . aspirin EC 81 MG EC tablet Take 1 tablet (81 mg total) by mouth daily.  Marland Kitchen ezetimibe (ZETIA) 10 MG tablet Take 10 mg by mouth at bedtime.   Marland Kitchen OVER THE COUNTER MEDICATION Take 1 packet by mouth 2 (two) times daily. Bariatric Multivitamin  . [DISCONTINUED] metoprolol tartrate (LOPRESSOR) 25 MG tablet Take 1 tablet (25 mg total) by mouth 2 (two) times daily.      Allergies:   Doxycycline and Naproxen   Social History   Socioeconomic History  . Marital status: Married    Spouse name: Married to [employer]  . Number of children: Not on file  . Years of education: Not on file  . Highest education level: Not on file  Occupational History  . Occupation: principal of [employer]  Social Needs  . Financial resource strain: Not on file  . Food insecurity:    Worry: Not on file    Inability: Not on file  . Transportation needs:    Medical: Not on file    Non-medical: Not on file  Tobacco Use  . Smoking status: Never Smoker  . Smokeless tobacco: Never Used  Substance and Sexual Activity  . Alcohol use: Yes    Comment:  rarely  . Drug use: No  . Sexual activity: Not on file  Lifestyle  . Physical activity:    Days per week: Not on file    Minutes per session: Not on file  . Stress: Not on file  Relationships  . Social connections:    Talks on phone: Not on file    Gets together: Not on file    Attends religious service: Not on file    Active member of club or organization: Not on file    Attends meetings of clubs or organizations: Not on file    Relationship status: Not on file  Other Topics Concern  . Not on file  Social History Narrative  . Not on file     Family History: The patient's family history includes Allergies in her daughter; Alzheimer's disease in her father; Asthma in her daughter, father, and paternal grandmother; Heart attack in her father; Heart disease in her father and mother; Hip fracture in her father; Hypertension in her father; Rheum arthritis in her mother. There is no history of Stroke. ROS:   Please see the history of present illness.     All other systems reviewed and are negative.  EKGs/Labs/Other Studies Reviewed:    The following studies were reviewed today:  Intraoperative TEE 12/05/2017  Aortic valve: The valve is trileaflet and severely thickened with significant calcification. R & Non  coronary cusp appear immobile. L coronary cusp display significantly reduced leaflet motion. Severe stenosis. Mean gradient 30-60mhg. Moderate AI  Mitral valve: Moderate leaflet calcification noted with reduced motion of the posterior leaflet. No prolapse noted. Mild regurgitation.  Right ventricle: Normal cavity size, wall thickness and ejection fraction.  Tricuspid valve: Mild regurgitation. Regurgitant jet is central.  Pericardium: No pericardial effusion.  Left ventricle: Normal cavity size. Normal LVEF. Moderate concentric LVH. No significant regional wall motion abnormalities.  Pulmonic valve: Normal leaflet motion. No insuffiency noted.  Left Atrial Appendage: No thrombus present.  Post Bypass TEE: Tricuspid, Pulmonic and Mitral Valve unchanged. Normal LVEF, no regional motion abnormalities. Aortic valve replaced with 21 mm tissue valve, seated well, no rocking, no perivalvular leaks noted (Mean gradient: 12 mmhg). No dissection noted after cannula removal.   EKG:  EKG is not ordered today.    Recent Labs: 12/06/2017: Magnesium 2.4 12/10/2017: ALT 20 01/24/2018: BUN 11; Creatinine, Ser 0.84; Hemoglobin WILL FOLLOW; Platelets WILL FOLLOW; Potassium 4.4; Sodium 139; TSH 6.700   Recent Lipid Panel No results found for: CHOL, TRIG, HDL, CHOLHDL, VLDL, LDLCALC, LDLDIRECT  Physical Exam:    VS:  BP 116/68   Pulse 67   Ht 5' 4"  (1.626 m)   Wt 215 lb (97.5 kg)   SpO2 97%   BMI 36.90 kg/m     Wt Readings from Last 3 Encounters:  01/24/18 215 lb (97.5 kg)  01/08/18 206 lb 3.2 oz (93.5 kg)  12/30/17 209 lb 6.4 oz (95 kg)     Physical Exam  Constitutional: She is oriented to person, place, and time. She appears well-developed and well-nourished. No distress.  HENT:  Head: Normocephalic and atraumatic.  Neck: Normal range of motion. Neck supple. No JVD present.  Cardiovascular: Normal rate, regular rhythm, normal heart sounds and intact distal pulses. Exam reveals no gallop  and no friction rub.  No murmur heard. Pulmonary/Chest: Effort normal and breath sounds normal. No respiratory distress. She has no wheezes. She has no rales.  Abdominal: Soft. Bowel sounds are normal.  Musculoskeletal: Normal range of motion. She  exhibits no edema or deformity.  Neurological: She is alert and oriented to person, place, and time.  Skin: Skin is warm and dry.  Psychiatric: She has a normal mood and affect. Her behavior is normal. Judgment and thought content normal.  Vitals reviewed.    ASSESSMENT:    1. Hyperlipidemia, unspecified hyperlipidemia type   2. Paroxysmal atrial fibrillation (HCC)   3. Fatigue, unspecified type   4. Hypokalemia    PLAN:    In order of problems listed above:  S/P tissue AVR 12/05/2017: Her chest is still sore when she lays down at her incision otherwise no cardiac type chest pain and only very occasional shortness of breath with increased exertion.  Her incision is healing well.  She is only walking 3 times a week.  She is very fatigued.  I have encouraged her to try to increase her walking especially when the weather gets less hot.  She has also not been taking her vitamins that she has been taking after her gastric sleeve but we discussed resuming the vitamins as it may help her eneryg. I will check CBC and TSH.   Paroxysmal atrial fibrillation: Postoperatively.    No apparent recurrences of A. fib, now off amiodarone.  On metoprolol tartrate 25 mg twice daily. I will stop her metoprolol also to see if this helps her fatigue.   Chronic thrombocytopenia: Related to portal hypertension [place]/splenomegaly.  Platelet count 54,000 in the hospital, was stable.  Followed at [facility]  Hyperlipidemia: On zetia 10 mg daily. Not on statin due to NASH. LDL 118, HDL 50. Continue current therapy.    Medication Adjustments/Labs and Tests Ordered: Current medicines are reviewed at length with the patient today.  Concerns regarding medicines are outlined  above. Labs and tests ordered and medication changes are outlined in the patient instructions below:  Patient Instructions  Medication Instructions: STOP: Metoprolol   Labwork: TODAY: BMET,CBC & TSH  Procedures/Testing: None  Follow-Up: Your physician recommends that you schedule a follow-up appointment in: 4 months with Dr. Irish Lack    Any Additional Special Instructions Will Be Listed Below (If Applicable).     If you need a refill on your cardiac medications before your next appointment, please call your pharmacy.      Signed, Daune Perch, NP  01/24/2018 6:17 PM     [facility]

## 2018-01-24 NOTE — Patient Instructions (Signed)
Medication Instructions: STOP: Metoprolol   Labwork: TODAY: BMET,CBC & TSH  Procedures/Testing: None  Follow-Up: Your physician recommends that you schedule a follow-up appointment in: 4 months with Dr. Irish Lack    Any Additional Special Instructions Will Be Listed Below (If Applicable).     If you need a refill on your cardiac medications before your next appointment, please call your pharmacy.

## 2018-01-25 LAB — BASIC METABOLIC PANEL
BUN / CREAT RATIO: 13 (ref 12–28)
BUN: 11 mg/dL (ref 8–27)
CALCIUM: 8.9 mg/dL (ref 8.7–10.3)
CO2: 25 mmol/L (ref 20–29)
CREATININE: 0.84 mg/dL (ref 0.57–1.00)
Chloride: 102 mmol/L (ref 96–106)
GFR, EST AFRICAN AMERICAN: 85 mL/min/{1.73_m2} (ref >59)
GFR, EST NON AFRICAN AMERICAN: 74 mL/min/{1.73_m2} (ref >59)
Glucose: 90 mg/dL (ref 65–99)
Potassium: 4.4 mmol/L (ref 3.5–5.2)
Sodium: 139 mmol/L (ref 134–144)

## 2018-01-25 LAB — CBC
HEMATOCRIT: 38.5 % (ref 34.0–46.6)
HEMOGLOBIN: 12.5 g/dL (ref 11.1–15.9)
MCH: 28.3 pg (ref 26.6–33.0)
MCHC: 32.5 g/dL (ref 31.5–35.7)
MCV: 87 fL (ref 79–97)
Platelets: 93 10*3/uL — CL (ref 150–450)
RBC: 4.41 x10E6/uL (ref 3.77–5.28)
RDW: 14.3 % (ref 12.3–15.4)
WBC: 3.4 10*3/uL (ref 3.4–10.8)

## 2018-01-25 LAB — TSH: TSH: 6.7 u[IU]/mL — AB (ref 0.450–4.500)

## 2018-01-27 ENCOUNTER — Telehealth: Payer: Self-pay | Admitting: Cardiology

## 2018-01-27 NOTE — Telephone Encounter (Signed)
New Message   Patient is returning call in reference to lab results. Please call to discuss.  

## 2018-01-28 ENCOUNTER — Telehealth (HOSPITAL_COMMUNITY): Payer: Self-pay

## 2018-02-03 ENCOUNTER — Other Ambulatory Visit: Payer: Self-pay | Admitting: Physician Assistant

## 2018-02-03 NOTE — Progress Notes (Signed)
Whitney [name] 62 y.o. female DOB Sep 20, 1955 MRN [id number]       Nutrition  No diagnosis found. Past Medical History:  Diagnosis Date  . Allergic rhinitis   . Anemia    hx  . Arthritis   . Asthma    hx yrs ago  . Cirrhosis ([place]) last albumin 3.3 done at [facility] 06-16-2014 (under care everywhere tab in epic)   Secondary to Fatty liver --  followed by hepatology at [facility] (dr Gerald Dexter)   . Depression   . Diabetes mellitus type II    type 2 diet conrolled  . Dyspnea   . Fibromyalgia   . GERD (gastroesophageal reflux disease)   . Heart murmur    asymptomatic ---  1989 from rhuematic fever  . History of exercise stress test    05-05-2013---  negative bruce ETT given exercise workload,  no ischemia  . History of hiatal hernia   . History of kidney stones   . History of rheumatic fever    1989  . Hyperlipidemia   . Leukocytopenia   . Moderate aortic stenosis    AVA area 1.1cm2---  cardiologist --  dr Concepcion Living, 2014 in epic  . NASH (nonalcoholic steatohepatitis)   . OSA (obstructive sleep apnea)    was using CPAP before gastric sleeve 2015--  no uses after wt loss  . Pneumonia    hx  . Sjogren's syndrome ([place])   . Thrombocytopenia ([place])    Meds reviewed.     Current Outpatient Medications (Cardiovascular):  .  ezetimibe (ZETIA) 10 MG tablet, Take 10 mg by mouth at bedtime.    Current Outpatient Medications (Analgesics):  .  aspirin EC 81 MG EC tablet, Take 1 tablet (81 mg total) by mouth daily.   Current Outpatient Medications (Other):  Marland Kitchen  OVER THE COUNTER MEDICATION, Take 1 packet by mouth 2 (two) times daily. Bariatric Multivitamin   HT: Ht Readings from Last 1 Encounters:  01/24/18 5' 4"  (1.626 m)    WT: Wt Readings from Last 5 Encounters:  01/24/18 215 lb (97.5 kg)  01/08/18 206 lb 3.2 oz (93.5 kg)  12/30/17 209 lb 6.4 oz (95 kg)  12/12/17 228 lb 6.3 oz (103.6 kg)  12/03/17 217 lb 8 oz (98.7 kg)     BMI 36.89   Current tobacco use? No       Labs:   Lipid Panel  No results found for: CHOL, TRIG, HDL, CHOLHDL, VLDL, LDLCALC, LDLDIRECT  Lab Results  Component Value Date   HGBA1C 5.5 12/03/2017   CBG (last 3)  No results for input(s): GLUCAP in the last 72 hours.  Nutrition Diagnosis ? Food-and nutrition-related knowledge deficit related to lack of exposure to information as related to diagnosis of: ? CVD   Nutrition Goal(s):  ? To be determined  Plan:  Pt to attend nutrition classes ? Nutrition I ? Nutrition II ? Portion Distortion  Will provide client-centered nutrition education as part of interdisciplinary care.   Monitor and evaluate progress toward nutrition goal with team.  Laurina Bustle, MS, RD, LDN 02/03/2018 12:02 PM

## 2018-02-04 ENCOUNTER — Telehealth (HOSPITAL_COMMUNITY): Payer: Self-pay

## 2018-02-04 NOTE — Telephone Encounter (Signed)
Cardiac Rehab Medication Review by a Pharmacist  Does the patient  feel that his/her medications are working for him/her?  yes  Has the patient been experiencing any side effects to the medications prescribed?  no  Does the patient measure his/her own blood pressure or blood glucose at home?  no   Does the patient have any problems obtaining medications due to transportation or finances?   no  Understanding of regimen: good Understanding of indications: good Potential of compliance: good  Pharmacist comments: none  Vertis Kelch, PharmD PGY1 Pharmacy Resident Phone [phone] 02/04/2018       4:03 PM

## 2018-02-06 ENCOUNTER — Encounter (HOSPITAL_COMMUNITY)
Admission: RE | Admit: 2018-02-06 | Discharge: 2018-02-06 | Disposition: A | Discharge status: Home / Self Care | Payer: Medicare Other | Source: Ambulatory Visit | Attending: Interventional Cardiology | Admitting: Interventional Cardiology

## 2018-02-06 ENCOUNTER — Encounter (HOSPITAL_COMMUNITY): Payer: Self-pay

## 2018-02-06 VITALS — Ht 64.0 in | Wt 213.8 lb

## 2018-02-06 DIAGNOSIS — Z7982 Long term (current) use of aspirin: Secondary | ICD-10-CM | POA: Insufficient documentation

## 2018-02-06 DIAGNOSIS — Z79899 Other long term (current) drug therapy: Secondary | ICD-10-CM | POA: Insufficient documentation

## 2018-02-06 DIAGNOSIS — Z952 Presence of prosthetic heart valve: Secondary | ICD-10-CM | POA: Diagnosis not present

## 2018-02-06 DIAGNOSIS — M3500 Sicca syndrome, unspecified: Secondary | ICD-10-CM | POA: Diagnosis not present

## 2018-02-06 DIAGNOSIS — E119 Type 2 diabetes mellitus without complications: Secondary | ICD-10-CM | POA: Diagnosis not present

## 2018-02-06 DIAGNOSIS — K219 Gastro-esophageal reflux disease without esophagitis: Secondary | ICD-10-CM | POA: Insufficient documentation

## 2018-02-06 DIAGNOSIS — I350 Nonrheumatic aortic (valve) stenosis: Secondary | ICD-10-CM | POA: Insufficient documentation

## 2018-02-06 DIAGNOSIS — E785 Hyperlipidemia, unspecified: Secondary | ICD-10-CM | POA: Diagnosis not present

## 2018-02-06 DIAGNOSIS — G4733 Obstructive sleep apnea (adult) (pediatric): Secondary | ICD-10-CM | POA: Diagnosis not present

## 2018-02-06 DIAGNOSIS — M797 Fibromyalgia: Secondary | ICD-10-CM | POA: Diagnosis not present

## 2018-02-06 NOTE — Progress Notes (Signed)
Cardiac Individual Treatment Plan  Patient Details  Name: Whitney [name] MRN: [id number] Date of Birth: October 13, 1955 Referring Provider:     CARDIAC REHAB PHASE II ORIENTATION from 02/06/2018 in [place]  Referring Provider  Thamas Jaegers MD       Initial Encounter Date:    CARDIAC REHAB PHASE II ORIENTATION from 02/06/2018 in [place]  Date  02/06/18      Visit Diagnosis: S/P TAVR (transcatheter aortic valve replacement)  Patient's Home Medications on Admission:  Current Outpatient Medications:  .  aspirin EC 81 MG EC tablet, Take 1 tablet (81 mg total) by mouth daily., Disp: , Rfl:  .  ezetimibe (ZETIA) 10 MG tablet, Take 10 mg by mouth at bedtime. , Disp: , Rfl: 11 .  OVER THE COUNTER MEDICATION, Take 1 packet by mouth 2 (two) times daily. Bariatric Multivitamin, Disp: , Rfl:   Past Medical History: Past Medical History:  Diagnosis Date  . Allergic rhinitis   . Anemia    hx  . Arthritis   . Asthma    hx yrs ago  . Cirrhosis ([place]) last albumin 3.3 done at [facility] 06-16-2014 (under care everywhere tab in epic)   Secondary to Fatty liver --  followed by hepatology at [facility] (dr Gerald Dexter)   . Depression   . Diabetes mellitus type II    type 2 diet conrolled  . Dyspnea   . Fibromyalgia   . GERD (gastroesophageal reflux disease)   . Heart murmur    asymptomatic ---  1989 from rhuematic fever  . History of exercise stress test    05-05-2013---  negative bruce ETT given exercise workload,  no ischemia  . History of hiatal hernia   . History of kidney stones   . History of rheumatic fever    1989  . Hyperlipidemia   . Leukocytopenia   . Moderate aortic stenosis    AVA area 1.1cm2---  cardiologist --  dr Concepcion Living, 2014 in epic  . NASH (nonalcoholic steatohepatitis)   . OSA (obstructive sleep apnea)    was using CPAP before gastric sleeve 2015--  no uses after wt loss  . Pneumonia    hx  . Sjogren's  syndrome ([place])   . Thrombocytopenia ([place])     Tobacco Use: Social History   Tobacco Use  Smoking Status Never Smoker  Smokeless Tobacco Never Used    Labs: Recent Review Flowsheet Data    Labs for ITP Cardiac and Pulmonary Rehab Latest Ref Rng & Units 12/05/2017 12/05/2017 12/05/2017 12/05/2017 12/05/2017   Hemoglobin A1c 4.8 - 5.6 % - - - - -   PHART 7.350 - 7.450 - 7.430 7.349(L) - 7.326(L)   PCO2ART 32.0 - 48.0 mmHg - 38.9 41.7 - 43.5   HCO3 20.0 - 28.0 mmol/L - 25.8 23.2 - 22.8   TCO2 22 - 32 mmol/L 26 27 25 23 24    ACIDBASEDEF 0.0 - 2.0 mmol/L - - 3.0(H) - 3.0(H)   O2SAT % - 100.0 97.0 - 97.0      Capillary Blood Glucose: Lab Results  Component Value Date   GLUCAP 100 (H) 12/12/2017   GLUCAP 166 (H) 12/11/2017   GLUCAP 161 (H) 12/11/2017   GLUCAP 114 (H) 12/11/2017   GLUCAP 111 (H) 12/10/2017     Exercise Target Goals: Exercise Program Goal: Individual exercise prescription set using results from initial 6 min walk test and THRR while considering  patient's activity barriers  and safety.   Exercise Prescription Goal: Initial exercise prescription builds to 30-45 minutes a day of aerobic activity, 2-3 days per week.  Home exercise guidelines will be given to patient during program as part of exercise prescription that the participant will acknowledge.  Activity Barriers & Risk Stratification: Activity Barriers & Cardiac Risk Stratification - 02/06/18 1139      Activity Barriers & Cardiac Risk Stratification   Activity Barriers  Fibromyalgia;Muscular Weakness;Deconditioning;Incisional Pain    Cardiac Risk Stratification  High       6 Minute Walk: 6 Minute Walk    Row Name 02/06/18 1137         6 Minute Walk   Phase  Initial     Distance  1405 feet     Walk Time  6 minutes     # of Rest Breaks  0     MPH  2.66     METS  3.04     RPE  12     Perceived Dyspnea   1     VO2 Peak  10.67     Symptoms  Yes (comment)     Comments  Mild SOB +1     Resting HR  76  bpm     Resting BP  138/68     Resting Oxygen Saturation   97 %     Exercise Oxygen Saturation  during 6 min walk  95 %     Max Ex. HR  105 bpm     Max Ex. BP  148/70     2 Minute Post BP  128/80        Oxygen Initial Assessment:   Oxygen Re-Evaluation:   Oxygen Discharge (Final Oxygen Re-Evaluation):   Initial Exercise Prescription: Initial Exercise Prescription - 02/06/18 1100      Date of Initial Exercise RX and Referring Provider   Date  02/06/18    Referring Provider  Thamas Jaegers MD     Expected Discharge Date  05/16/17      Treadmill   MPH  2.3    Grade  1    Minutes  10    METs  3.08      Recumbant Bike   Level  1.5    Watts  20    Minutes  10    METs  2.79      NuStep   Level  2    SPM  75    Minutes  10    METs  2.8      Prescription Details   Frequency (times per week)  3x    Duration  Progress to 30 minutes of continuous aerobic without signs/symptoms of physical distress      Intensity   THRR 40-80% of Max Heartrate  62-125    Ratings of Perceived Exertion  11-13    Perceived Dyspnea  0-4      Progression   Progression  Continue progressive overload as per policy without signs/symptoms or physical distress.      Resistance Training   Training Prescription  Yes    Weight  2lbs    Reps  10-15       Perform Capillary Blood Glucose checks as needed.  Exercise Prescription Changes:   Exercise Comments:   Exercise Goals and Review: Exercise Goals    Row Name 02/06/18 1004             Exercise Goals   Increase Physical Activity  Yes  Intervention  Provide advice, education, support and counseling about physical activity/exercise needs.;Develop an individualized exercise prescription for aerobic and resistive training based on initial evaluation findings, risk stratification, comorbidities and participant's personal goals.       Expected Outcomes  Short Term: Attend rehab on a regular basis to increase amount of  physical activity.;Long Term: Exercising regularly at least 3-5 days a week.;Long Term: Add in home exercise to make exercise part of routine and to increase amount of physical activity.       Increase Strength and Stamina  Yes       Intervention  Provide advice, education, support and counseling about physical activity/exercise needs.;Develop an individualized exercise prescription for aerobic and resistive training based on initial evaluation findings, risk stratification, comorbidities and participant's personal goals.       Expected Outcomes  Short Term: Increase workloads from initial exercise prescription for resistance, speed, and METs.;Short Term: Perform resistance training exercises routinely during rehab and add in resistance training at home;Long Term: Improve cardiorespiratory fitness, muscular endurance and strength as measured by increased METs and functional capacity (6MWT)       Able to understand and use rate of perceived exertion (RPE) scale  Yes       Intervention  Provide education and explanation on how to use RPE scale       Expected Outcomes  Short Term: Able to use RPE daily in rehab to express subjective intensity level;Long Term:  Able to use RPE to guide intensity level when exercising independently       Knowledge and understanding of Target Heart Rate Range (THRR)  Yes       Intervention  Provide education and explanation of THRR including how the numbers were predicted and where they are located for reference       Expected Outcomes  Short Term: Able to state/look up THRR;Long Term: Able to use THRR to govern intensity when exercising independently;Short Term: Able to use daily as guideline for intensity in rehab       Able to check pulse independently  Yes       Intervention  Provide education and demonstration on how to check pulse in carotid and radial arteries.;Review the importance of being able to check your own pulse for safety during independent exercise        Expected Outcomes  Short Term: Able to explain why pulse checking is important during independent exercise;Long Term: Able to check pulse independently and accurately       Understanding of Exercise Prescription  Yes       Intervention  Provide education, explanation, and written materials on patient's individual exercise prescription       Expected Outcomes  Short Term: Able to explain program exercise prescription;Long Term: Able to explain home exercise prescription to exercise independently          Exercise Goals Re-Evaluation :   Discharge Exercise Prescription (Final Exercise Prescription Changes):   Nutrition:  Target Goals: Understanding of nutrition guidelines, daily intake of sodium <1528m, cholesterol <2082m calories 30% from fat and 7% or less from saturated fats, daily to have 5 or more servings of fruits and vegetables.  Biometrics: Pre Biometrics - 02/06/18 1139      Pre Biometrics   Height  5' 4"  (1.626 m)    Weight  97 kg    Waist Circumference  39.5 inches    Hip Circumference  41 inches    Waist to Hip Ratio  0.96 %  BMI (Calculated)  36.69    Triceps Skinfold  41 mm    % Body Fat  47.1 %    Grip Strength  36 kg    Flexibility  15.5 in    Single Leg Stand  7.22 seconds        Nutrition Therapy Plan and Nutrition Goals: Nutrition Therapy & Goals - 02/06/18 1122      Nutrition Therapy   Diet  heart healthy      Personal Nutrition Goals   Nutrition Goal  Pt to identify and limit food sources of saturated fat, trans fat, refined carbohydrates and sodium    Personal Goal #2  Pt to identify food quantities necessary to achieve weight loss of 6-24 lbs. at graduation from cardiac rehab. Goal wt loss of 20 lb desired.     Personal Goal #3  Pt to foods in order of lean protein, non-starchy veggies +healthy fat, then complex carbohydrate     Personal Goal #4  Pt to eat more frequently across the day, 5-6 smaller meals      Intervention Plan   Intervention   Prescribe, educate and counsel regarding individualized specific dietary modifications aiming towards targeted core components such as weight, hypertension, lipid management, diabetes, heart failure and other comorbidities.    Expected Outcomes  Short Term Goal: Understand basic principles of dietary content, such as calories, fat, sodium, cholesterol and nutrients.;Long Term Goal: Adherence to prescribed nutrition plan.       Nutrition Assessments: Nutrition Assessments - 02/06/18 1123      MEDFICTS Scores   Pre Score  21       Nutrition Goals Re-Evaluation:   Nutrition Goals Re-Evaluation:   Nutrition Goals Discharge (Final Nutrition Goals Re-Evaluation):   Psychosocial: Target Goals: Acknowledge presence or absence of significant depression and/or stress, maximize coping skills, provide positive support system. Participant is able to verbalize types and ability to use techniques and skills needed for reducing stress and depression.  Initial Review & Psychosocial Screening: Initial Psych Review & Screening - 02/06/18 1145      Initial Review   Current issues with  History of Depression   No current issues.      Family Dynamics   Good Support System?  Yes   Pt reports her children and grandchildren are sources of support for her.      Barriers   Psychosocial barriers to participate in program  There are no identifiable barriers or psychosocial needs.       Quality of Life Scores: Quality of Life - 02/06/18 1147      Quality of Life Scores   Health/Function Pre  23.07 %    Socioeconomic Pre  27 %    Psych/Spiritual Pre  24.33 %    Family Pre  25.5 %    GLOBAL Pre  24.52 %      Scores of 19 and below usually indicate a poorer quality of life in these areas.  A difference of  2-3 points is a clinically meaningful difference.  A difference of 2-3 points in the total score of the Quality of Life Index has been associated with significant improvement in overall quality  of life, self-image, physical symptoms, and general health in studies assessing change in quality of life.  PHQ-9: Recent Review Flowsheet Data    There is no flowsheet data to display.     Interpretation of Total Score  Total Score Depression Severity:  1-4 = Minimal depression, 5-9 = Mild depression,  10-14 = Moderate depression, 15-19 = Moderately severe depression, 20-27 = Severe depression   Psychosocial Evaluation and Intervention:   Psychosocial Re-Evaluation:   Psychosocial Discharge (Final Psychosocial Re-Evaluation):   Vocational Rehabilitation: Provide vocational rehab assistance to qualifying candidates.   Vocational Rehab Evaluation & Intervention: Vocational Rehab - 02/06/18 1147      Initial Vocational Rehab Evaluation & Intervention   Assessment shows need for Vocational Rehabilitation  No       Education: Education Goals: Education classes will be provided on a weekly basis, covering required topics. Participant will state understanding/return demonstration of topics presented.  Learning Barriers/Preferences: Learning Barriers/Preferences - 02/06/18 1142      Learning Barriers/Preferences   Learning Barriers  Sight    Learning Preferences  Video;Skilled Demonstration;Written Material       Education Topics: Count Your Pulse:  -Group instruction provided by verbal instruction, demonstration, patient participation and written materials to support subject.  Instructors address importance of being able to find your pulse and how to count your pulse when at home without a heart monitor.  Patients get hands on experience counting their pulse with staff help and individually.   Heart Attack, Angina, and Risk Factor Modification:  -Group instruction provided by verbal instruction, video, and written materials to support subject.  Instructors address signs and symptoms of angina and heart attacks.    Also discuss risk factors for heart disease and how to make  changes to improve heart health risk factors.   Functional Fitness:  -Group instruction provided by verbal instruction, demonstration, patient participation, and written materials to support subject.  Instructors address safety measures for doing things around the house.  Discuss how to get up and down off the floor, how to pick things up properly, how to safely get out of a chair without assistance, and balance training.   Meditation and Mindfulness:  -Group instruction provided by verbal instruction, patient participation, and written materials to support subject.  Instructor addresses importance of mindfulness and meditation practice to help reduce stress and improve awareness.  Instructor also leads participants through a meditation exercise.    Stretching for Flexibility and Mobility:  -Group instruction provided by verbal instruction, patient participation, and written materials to support subject.  Instructors lead participants through series of stretches that are designed to increase flexibility thus improving mobility.  These stretches are additional exercise for major muscle groups that are typically performed during regular warm up and cool down.   Hands Only CPR:  -Group verbal, video, and participation provides a basic overview of AHA guidelines for community CPR. Role-play of emergencies allow participants the opportunity to practice calling for help and chest compression technique with discussion of AED use.   Hypertension: -Group verbal and written instruction that provides a basic overview of hypertension including the most recent diagnostic guidelines, risk factor reduction with self-care instructions and medication management.    Nutrition I class: Heart Healthy Eating:  -Group instruction provided by PowerPoint slides, verbal discussion, and written materials to support subject matter. The instructor gives an explanation and review of the Therapeutic Lifestyle Changes  diet recommendations, which includes a discussion on lipid goals, dietary fat, sodium, fiber, plant stanol/sterol esters, sugar, and the components of a well-balanced, healthy diet.   Nutrition II class: Lifestyle Skills:  -Group instruction provided by PowerPoint slides, verbal discussion, and written materials to support subject matter. The instructor gives an explanation and review of label reading, grocery shopping for heart health, heart healthy recipe modifications, and ways  to make healthier choices when eating out.   Diabetes Question & Answer:  -Group instruction provided by PowerPoint slides, verbal discussion, and written materials to support subject matter. The instructor gives an explanation and review of diabetes co-morbidities, pre- and post-prandial blood glucose goals, pre-exercise blood glucose goals, signs, symptoms, and treatment of hypoglycemia and hyperglycemia, and foot care basics.   Diabetes Blitz:  -Group instruction provided by PowerPoint slides, verbal discussion, and written materials to support subject matter. The instructor gives an explanation and review of the physiology behind type 1 and type 2 diabetes, diabetes medications and rational behind using different medications, pre- and post-prandial blood glucose recommendations and Hemoglobin A1c goals, diabetes diet, and exercise including blood glucose guidelines for exercising safely.    Portion Distortion:  -Group instruction provided by PowerPoint slides, verbal discussion, written materials, and food models to support subject matter. The instructor gives an explanation of serving size versus portion size, changes in portions sizes over the last 20 years, and what consists of a serving from each food group.   Stress Management:  -Group instruction provided by verbal instruction, video, and written materials to support subject matter.  Instructors review role of stress in heart disease and how to cope with  stress positively.     Exercising on Your Own:  -Group instruction provided by verbal instruction, power point, and written materials to support subject.  Instructors discuss benefits of exercise, components of exercise, frequency and intensity of exercise, and end points for exercise.  Also discuss use of nitroglycerin and activating EMS.  Review options of places to exercise outside of rehab.  Review guidelines for sex with heart disease.   Cardiac Drugs I:  -Group instruction provided by verbal instruction and written materials to support subject.  Instructor reviews cardiac drug classes: antiplatelets, anticoagulants, beta blockers, and statins.  Instructor discusses reasons, side effects, and lifestyle considerations for each drug class.   Cardiac Drugs II:  -Group instruction provided by verbal instruction and written materials to support subject.  Instructor reviews cardiac drug classes: angiotensin converting enzyme inhibitors (ACE-I), angiotensin II receptor blockers (ARBs), nitrates, and calcium channel blockers.  Instructor discusses reasons, side effects, and lifestyle considerations for each drug class.   Anatomy and Physiology of the Circulatory System:  Group verbal and written instruction and models provide basic cardiac anatomy and physiology, with the coronary electrical and arterial systems. Review of: AMI, Angina, Valve disease, Heart Failure, Peripheral Artery Disease, Cardiac Arrhythmia, Pacemakers, and the ICD.   Other Education:  -Group or individual verbal, written, or video instructions that support the educational goals of the cardiac rehab program.   Holiday Eating Survival Tips:  -Group instruction provided by PowerPoint slides, verbal discussion, and written materials to support subject matter. The instructor gives patients tips, tricks, and techniques to help them not only survive but enjoy the holidays despite the onslaught of food that accompanies the  holidays.   Knowledge Questionnaire Score: Knowledge Questionnaire Score - 02/06/18 1048      Knowledge Questionnaire Score   Pre Score  21/24       Core Components/Risk Factors/Patient Goals at Admission: Personal Goals and Risk Factors at Admission - 02/06/18 1141      Core Components/Risk Factors/Patient Goals on Admission    Weight Management  Yes;Weight Loss    Intervention  Weight Management: Develop a combined nutrition and exercise program designed to reach desired caloric intake, while maintaining appropriate intake of nutrient and fiber, sodium and fats, and appropriate energy  expenditure required for the weight goal.;Weight Management: Provide education and appropriate resources to help participant work on and attain dietary goals.;Weight Management/Obesity: Establish reasonable short term and long term weight goals.;Obesity: Provide education and appropriate resources to help participant work on and attain dietary goals.    Admit Weight  213 lb 13.5 oz (97 kg)    Goal Weight: Long Term  200 lb (90.7 kg)    Expected Outcomes  Long Term: Adherence to nutrition and physical activity/exercise program aimed toward attainment of established weight goal;Short Term: Continue to assess and modify interventions until short term weight is achieved;Weight Loss: Understanding of general recommendations for a balanced deficit meal plan, which promotes 1-2 lb weight loss per week and includes a negative energy balance of [id number] kcal/d;Understanding recommendations for meals to include 15-35% energy as protein, 25-35% energy from fat, 35-60% energy from carbohydrates, less than 282m of dietary cholesterol, 20-35 gm of total fiber daily;Understanding of distribution of calorie intake throughout the day with the consumption of 4-5 meals/snacks    Diabetes  Yes    Intervention  Provide education about signs/symptoms and action to take for hypo/hyperglycemia.;Provide education about proper nutrition,  including hydration, and aerobic/resistive exercise prescription along with prescribed medications to achieve blood glucose in normal ranges: Fasting glucose 65-99 mg/dL    Expected Outcomes  Short Term: Participant verbalizes understanding of the signs/symptoms and immediate care of hyper/hypoglycemia, proper foot care and importance of medication, aerobic/resistive exercise and nutrition plan for blood glucose control.;Long Term: Attainment of HbA1C < 7%.    Hypertension  Yes    Intervention  Provide education on lifestyle modifcations including regular physical activity/exercise, weight management, moderate sodium restriction and increased consumption of fresh fruit, vegetables, and low fat dairy, alcohol moderation, and smoking cessation.;Monitor prescription use compliance.    Expected Outcomes  Short Term: Continued assessment and intervention until BP is < 140/918mHG in hypertensive participants. < 130/8092mG in hypertensive participants with diabetes, heart failure or chronic kidney disease.;Long Term: Maintenance of blood pressure at goal levels.    Lipids  Yes    Intervention  Provide education and support for participant on nutrition & aerobic/resistive exercise along with prescribed medications to achieve LDL <20m24mDL >40mg66m Expected Outcomes  Short Term: Participant states understanding of desired cholesterol values and is compliant with medications prescribed. Participant is following exercise prescription and nutrition guidelines.;Long Term: Cholesterol controlled with medications as prescribed, with individualized exercise RX and with personalized nutrition plan. Value goals: LDL < 20mg,77m > 40 mg.       Core Components/Risk Factors/Patient Goals Review:    Core Components/Risk Factors/Patient Goals at Discharge (Final Review):    ITP Comments: ITP Comments    Row Name 02/06/18 1003           ITP Comments  Dr. Traci Fransico Himcal Director           Comments:  Patient attended orientation from [phone] to review rules and guidelines for program. Completed 6 minute walk test, Intitial ITP, and exercise prescription.  VSS. Telemetry-SR.  Asymptomatic.

## 2018-02-06 NOTE — Progress Notes (Signed)
Whitney [name] 62 y.o. female DOB: 1956/04/26 MRN: [id number]      Nutrition Note  No diagnosis found. Past Medical History:  Diagnosis Date  . Allergic rhinitis   . Anemia    hx  . Arthritis   . Asthma    hx yrs ago  . Cirrhosis ([place]) last albumin 3.3 done at [facility] 06-16-2014 (under care everywhere tab in epic)   Secondary to Fatty liver --  followed by hepatology at [facility] (dr Gerald Dexter)   . Depression   . Diabetes mellitus type II    type 2 diet conrolled  . Dyspnea   . Fibromyalgia   . GERD (gastroesophageal reflux disease)   . Heart murmur    asymptomatic ---  1989 from rhuematic fever  . History of exercise stress test    05-05-2013---  negative bruce ETT given exercise workload,  no ischemia  . History of hiatal hernia   . History of kidney stones   . History of rheumatic fever    1989  . Hyperlipidemia   . Leukocytopenia   . Moderate aortic stenosis    AVA area 1.1cm2---  cardiologist --  dr Concepcion Living, 2014 in epic  . NASH (nonalcoholic steatohepatitis)   . OSA (obstructive sleep apnea)    was using CPAP before gastric sleeve 2015--  no uses after wt loss  . Pneumonia    hx  . Sjogren's syndrome ([place])   . Thrombocytopenia ([place])    Meds reviewed.    Current Outpatient Medications (Cardiovascular):  .  ezetimibe (ZETIA) 10 MG tablet, Take 10 mg by mouth at bedtime.    Current Outpatient Medications (Analgesics):  .  aspirin EC 81 MG EC tablet, Take 1 tablet (81 mg total) by mouth daily.   Current Outpatient Medications (Other):  Marland Kitchen  OVER THE COUNTER MEDICATION, Take 1 packet by mouth 2 (two) times daily. Bariatric Multivitamin   HT: Ht Readings from Last 1 Encounters:  01/24/18 5' 4"  (1.626 m)    WT: Wt Readings from Last 5 Encounters:  01/24/18 215 lb (97.5 kg)  01/08/18 206 lb 3.2 oz (93.5 kg)  12/30/17 209 lb 6.4 oz (95 kg)  12/12/17 228 lb 6.3 oz (103.6 kg)  12/03/17 217 lb 8 oz (98.7 kg)     There is no height or weight on file to calculate  BMI.   Current tobacco use? No  Labs:  Lipid Panel  No results found for: CHOL, TRIG, HDL, CHOLHDL, VLDL, LDLCALC, LDLDIRECT  Lab Results  Component Value Date   HGBA1C 5.5 12/03/2017   CBG (last 3)  No results for input(s): GLUCAP in the last 72 hours.  Nutrition Note Spoke with pt. Nutrition plan and goals reviewed with pt. Pt is following Step 2 of the Therapeutic Lifestyle Changes diet. Pt wants to lose wt, about 20 lbs. Pt has not actively been trying to lose wt. Wt loss tips reviewed (label reading, how to build a healthy plate, portion sizes of foods eaten, eating frequently across the day, and developing a set schedule for eating smaller meals). Gastric sleeve 2015. Discussed returning to the basics of nutrition post bariatric surgery, eating smaller portions, avoiding slider foods, order of foods eaten (lean protein, non-starchy veggies+ healthy fat, then complex carbohydrate, etc.), and focusing on getting in an adequate amount of protein. Pt has Type 2 Diabetes. Last A1c indicates blood glucose well-controlled. Pt shared that her A1c changed dramatically after her gastric sleeve, d/t she does not check CBG's (  as she is diet controlled). Per discussion, pt does not not use canned/convenience foods often. Pt does not add salt to food. Pt does not eat out frequently. Pt expressed understanding of the information reviewed. Pt aware of nutrition education classes offered and would like to attend nutrition classes.  Nutrition Diagnosis ? Food-and nutrition-related knowledge deficit related to lack of exposure to information as related to diagnosis of: ? CVD ? Type 2 Diabetes  Nutrition Intervention ? Pt's individual nutrition plan and goals reviewed with pt  Nutrition Goal(s):   ? Pt to identify and limit food sources of saturated fat, trans fat, refined carbohydrates and sodium ? Pt to identify food quantities necessary to achieve weight loss of 6-24 lbs. at graduation from cardiac  rehab. Goal wt loss of 20 lb desired.  ? Pt to foods in order of lean protein, non-starchy veggies +healthy fat, then complex carbohydrate  ? Pt to eat more frequently across the day, 5-6 smaller meals   Plan:  ? Pt to attend nutrition classes ? Nutrition I ? Nutrition II ? Portion Distortion  ? Diabetes Blitz ? Diabetes Q & Ae determined ? Will provide client-centered nutrition education as part of interdisciplinary care ? Monitor and evaluate progress toward nutrition goal with team.   Laurina Bustle, MS, RD, LDN 02/06/2018 11:13 AM

## 2018-02-10 ENCOUNTER — Encounter (HOSPITAL_COMMUNITY): Payer: Medicare Other

## 2018-02-12 ENCOUNTER — Encounter (HOSPITAL_COMMUNITY): Payer: Medicare Other

## 2018-02-12 ENCOUNTER — Encounter (HOSPITAL_COMMUNITY)
Admission: RE | Admit: 2018-02-12 | Discharge: 2018-02-12 | Disposition: A | Discharge status: Home / Self Care | Payer: Medicare Other | Source: Ambulatory Visit | Attending: Interventional Cardiology | Admitting: Interventional Cardiology

## 2018-02-12 ENCOUNTER — Encounter: Payer: Medicare Other | Admitting: Surgery

## 2018-02-12 ENCOUNTER — Encounter (HOSPITAL_COMMUNITY): Payer: Self-pay

## 2018-02-12 DIAGNOSIS — Z952 Presence of prosthetic heart valve: Secondary | ICD-10-CM | POA: Diagnosis not present

## 2018-02-12 LAB — GLUCOSE, CAPILLARY: Glucose-Capillary: 79 mg/dL (ref 70–99)

## 2018-02-12 NOTE — Progress Notes (Signed)
Daily Session Note  Patient Details  Name: Whitney [name] MRN: [id number] Date of Birth: 11/14/1955 Referring Provider:     CARDIAC REHAB PHASE II ORIENTATION from 02/06/2018 in [place]  Referring Provider  Thamas Jaegers MD       Encounter Date: 02/12/2018  Check In: Session Check In - 02/12/18 1150      Check-In   Supervising physician immediately available to respond to emergencies  [facility] immediately available    Physician(s)  Dr. Herbert Moors    Location  [facility]    Staff Present  Jiles Garter, RN, Mosie Epstein, MS,ACSM CEP, Exercise Physiologist;Brittany Durene Fruits, BS, ACSM CEP, Exercise Physiologist;Dalton Kris Mouton, MS, Exercise Physiologist    Medication changes reported      No    Fall or balance concerns reported     No    Tobacco Cessation  No Change    Warm-up and Cool-down  Performed as group-led instruction    Resistance Training Performed  No    VAD Patient?  No    PAD/SET Patient?  No      Pain Assessment   Currently in Pain?  No/denies       Capillary Blood Glucose: Results for orders placed or performed during the hospital encounter of 02/12/18 (from the past 24 hour(s))  Glucose, capillary     Status: None   Collection Time: 02/12/18 12:27 PM  Result Value Ref Range   Glucose-Capillary 79 70 - 99 mg/dL      Social History   Tobacco Use  Smoking Status Never Smoker  Smokeless Tobacco Never Used    Goals Met:  Exercise tolerated well  Goals Unmet:  Not Applicable  Comments: Pt started cardiac rehab today.  Pt tolerated light exercise without difficulty. VSS, telemetry-SR, asymptomatic.  Medication list reconciled. Pt denies barriers to medicaiton compliance.  PSYCHOSOCIAL ASSESSMENT:  PHQ-0. Pt exhibits positive coping skills, hopeful outlook with supportive family. No psychosocial needs identified at this time, no psychosocial interventions necessary.   Pt oriented to exercise  equipment and routine.    Understanding verbalized.    Dr. Fransico Him is Medical Director for Cardiac Rehab at [facility].

## 2018-02-14 ENCOUNTER — Encounter (HOSPITAL_COMMUNITY): Payer: Medicare Other

## 2018-02-14 ENCOUNTER — Encounter (HOSPITAL_COMMUNITY)
Admission: RE | Admit: 2018-02-14 | Discharge: 2018-02-14 | Disposition: A | Discharge status: Home / Self Care | Payer: Medicare Other | Source: Ambulatory Visit | Attending: Interventional Cardiology | Admitting: Interventional Cardiology

## 2018-02-14 DIAGNOSIS — Z952 Presence of prosthetic heart valve: Secondary | ICD-10-CM

## 2018-02-17 ENCOUNTER — Encounter (HOSPITAL_COMMUNITY): Payer: Medicare Other

## 2018-02-17 ENCOUNTER — Encounter (HOSPITAL_COMMUNITY)
Admission: RE | Admit: 2018-02-17 | Discharge: 2018-02-17 | Disposition: A | Discharge status: Home / Self Care | Payer: Medicare Other | Source: Ambulatory Visit | Attending: Interventional Cardiology | Admitting: Interventional Cardiology

## 2018-02-17 DIAGNOSIS — Z952 Presence of prosthetic heart valve: Secondary | ICD-10-CM | POA: Diagnosis not present

## 2018-02-19 ENCOUNTER — Encounter (HOSPITAL_COMMUNITY): Payer: Medicare Other

## 2018-02-19 ENCOUNTER — Encounter (HOSPITAL_COMMUNITY)
Admission: RE | Admit: 2018-02-19 | Discharge: 2018-02-19 | Disposition: A | Discharge status: Home / Self Care | Payer: Medicare Other | Source: Ambulatory Visit | Attending: Interventional Cardiology | Admitting: Interventional Cardiology

## 2018-02-19 DIAGNOSIS — Z952 Presence of prosthetic heart valve: Secondary | ICD-10-CM | POA: Diagnosis not present

## 2018-02-21 ENCOUNTER — Encounter (HOSPITAL_COMMUNITY): Payer: Medicare Other

## 2018-02-21 ENCOUNTER — Encounter (HOSPITAL_COMMUNITY)
Admission: RE | Admit: 2018-02-21 | Discharge: 2018-02-21 | Disposition: A | Discharge status: Home / Self Care | Payer: Medicare Other | Source: Ambulatory Visit | Attending: Interventional Cardiology | Admitting: Interventional Cardiology

## 2018-02-21 DIAGNOSIS — Z952 Presence of prosthetic heart valve: Secondary | ICD-10-CM

## 2018-02-24 ENCOUNTER — Encounter (HOSPITAL_COMMUNITY): Payer: Medicare Other

## 2018-02-24 ENCOUNTER — Encounter (HOSPITAL_COMMUNITY)
Admission: RE | Admit: 2018-02-24 | Discharge: 2018-02-24 | Disposition: A | Discharge status: Home / Self Care | Payer: Medicare Other | Source: Ambulatory Visit | Attending: Interventional Cardiology | Admitting: Interventional Cardiology

## 2018-02-24 DIAGNOSIS — Z952 Presence of prosthetic heart valve: Secondary | ICD-10-CM | POA: Diagnosis not present

## 2018-02-26 ENCOUNTER — Encounter: Payer: Self-pay | Admitting: Surgery

## 2018-02-26 ENCOUNTER — Encounter (HOSPITAL_COMMUNITY)
Admission: RE | Admit: 2018-02-26 | Discharge: 2018-02-26 | Disposition: A | Discharge status: Home / Self Care | Payer: Medicare Other | Source: Ambulatory Visit | Attending: Interventional Cardiology | Admitting: Interventional Cardiology

## 2018-02-26 ENCOUNTER — Ambulatory Visit (INDEPENDENT_AMBULATORY_CARE_PROVIDER_SITE_OTHER): Payer: Self-pay | Admitting: Surgery

## 2018-02-26 ENCOUNTER — Encounter (HOSPITAL_COMMUNITY): Payer: Medicare Other

## 2018-02-26 VITALS — BP 134/82 | HR 82 | Resp 20 | Ht 64.0 in | Wt 215.0 lb

## 2018-02-26 DIAGNOSIS — Z952 Presence of prosthetic heart valve: Secondary | ICD-10-CM

## 2018-02-26 DIAGNOSIS — I350 Nonrheumatic aortic (valve) stenosis: Secondary | ICD-10-CM

## 2018-02-26 NOTE — Progress Notes (Signed)
     HPI:  The patient returns for routine follow-up status post aortic valve replacement using a 21 mm Edwards INSPIRIS RESILIA pericardial valve on 12/05/2017.  Her postoperative course was complicated by development of postoperative atrial fibrillation that was converted with amiodarone.  She also had mild volume overload requiring Lasix and potassium at discharge. When I saw her at her last visit on 01/08/2018 I stopped the amiodarone.  She was complaining of a lot of fatigue at that time and was not progressing with her activity.  She went back to see cardiology and the nurse practitioner stopped her Lopressor on 01/24/2018.  She said that since being off of Lopressor she does feel better.  Her stamina is improving.  She has been going to cardiac rehab 3 times per week.  Current Outpatient Medications  Medication Sig Dispense Refill  . aspirin EC 81 MG EC tablet Take 1 tablet (81 mg total) by mouth daily.    Marland Kitchen ezetimibe (ZETIA) 10 MG tablet Take 10 mg by mouth at bedtime.   11  . OVER THE COUNTER MEDICATION Take 1 packet by mouth 2 (two) times daily. Bariatric Multivitamin     No current facility-administered medications for this visit.     Physical Exam: BP 134/82   Pulse 82   Resp 20   Ht 5' 4"  (1.626 m)   Wt 215 lb (97.5 kg)   SpO2 97% Comment: RA  BMI 36.90 kg/m  She looks well. Cardiac exam shows a regular rate and rhythm with normal valve sounds and a 1 out of 6 systolic flow murmur across the aortic valve prosthesis.  There is no diastolic murmur. Lungs are clear. Chest incision is healing well and sternum stable. There is no peripheral edema.  Diagnostic Tests:  None today  Impression:  Overall I think she is making progress following aortic valve replacement surgery.  She feels better off of amiodarone and Lopressor.  I encouraged her to continue increasing her activity level and to finish cardiac rehab.  She is almost 3 months following surgery and can resume normal  full activity as of 03/07/2018.  Plan:  She is going to follow-up with Dr. Irish Lack in December.  He will decide about doing any follow-up echocardiograms to evaluate her valve in the future.  She will return to see me if she has any problems with her incision.   Gaye Pollack, MD [facility] [phone]

## 2018-02-28 ENCOUNTER — Encounter (HOSPITAL_COMMUNITY)
Admission: RE | Admit: 2018-02-28 | Discharge: 2018-02-28 | Disposition: A | Discharge status: Home / Self Care | Payer: Medicare Other | Source: Ambulatory Visit | Attending: Interventional Cardiology | Admitting: Interventional Cardiology

## 2018-02-28 ENCOUNTER — Encounter (HOSPITAL_COMMUNITY): Payer: Medicare Other

## 2018-02-28 DIAGNOSIS — Z952 Presence of prosthetic heart valve: Secondary | ICD-10-CM | POA: Diagnosis not present

## 2018-03-03 ENCOUNTER — Encounter (HOSPITAL_COMMUNITY): Payer: Medicare Other

## 2018-03-03 ENCOUNTER — Encounter (HOSPITAL_COMMUNITY)
Admission: RE | Admit: 2018-03-03 | Discharge: 2018-03-03 | Disposition: A | Discharge status: Home / Self Care | Payer: Medicare Other | Source: Ambulatory Visit | Attending: Interventional Cardiology | Admitting: Interventional Cardiology

## 2018-03-03 DIAGNOSIS — Z952 Presence of prosthetic heart valve: Secondary | ICD-10-CM

## 2018-03-04 ENCOUNTER — Telehealth: Payer: Self-pay | Admitting: Interventional Cardiology

## 2018-03-04 MED ORDER — AMOXICILLIN 500 MG PO CAPS: ORAL_CAPSULE | ORAL | 3 refills | Status: DC

## 2018-03-04 NOTE — Telephone Encounter (Signed)
        1. What dental office are you calling from? [facility], Dr Heloise Ochoa  2. What is your office phone number? [phone]  3. What is your fax number?[phone]  4. What type of procedure is the patient having performed? Routine cleaning  5. What date is procedure scheduled or is the patient there now? 03/18/18  6. What is your question (ex. Antibiotics prior to procedure, holding medication-we need to know how long dentist wants pt to hold med)? Does patient need antibiotics before cleaning, should have a valve card    Gwendel Hanson 03/04/2018, 2:51 PM  _________________________________________________________________   (provider comments below)  ---

## 2018-03-04 NOTE — Telephone Encounter (Signed)
Yes she will require pre op abx due to hx of tissue AVR. Please inform pt that rx for amoxicillin has been sent to her local pharmacy.

## 2018-03-05 ENCOUNTER — Encounter (HOSPITAL_COMMUNITY): Payer: Medicare Other

## 2018-03-05 ENCOUNTER — Encounter (HOSPITAL_COMMUNITY): Payer: Self-pay

## 2018-03-05 ENCOUNTER — Encounter (HOSPITAL_COMMUNITY)
Admission: RE | Admit: 2018-03-05 | Discharge: 2018-03-05 | Disposition: A | Discharge status: Home / Self Care | Payer: Medicare Other | Source: Ambulatory Visit | Attending: Interventional Cardiology | Admitting: Interventional Cardiology

## 2018-03-05 DIAGNOSIS — Z952 Presence of prosthetic heart valve: Secondary | ICD-10-CM | POA: Diagnosis not present

## 2018-03-05 NOTE — Telephone Encounter (Signed)
   Primary Cardiologist: Larae Grooms, MD  Chart reviewed as part of pre-operative protocol coverage.   The patient is s/p bioprosthetic AVR 12/2017.    Simple dental extractions are considered low risk procedures per guidelines and generally do not require any specific cardiac clearance. It is also generally accepted that for simple extractions and dental cleanings, there is no need to interrupt blood thinner or antiplatelet therapy.   SBE prophylaxis is required for the patient.  PLAN:  1. The patient should continue Aspirin without interruption. 2. The patient should take antibiotics prior to her dental cleaning (Amoxicillin 2000 mg 30-60 minutes prior to procedure).  Please call with questions.  Richardson Dopp, PA-C 03/05/2018, 9:42 AM

## 2018-03-05 NOTE — Telephone Encounter (Signed)
   Call back staff:  Please make sure patient knows she needs to take antibiotics prior to her procedure.  A prescription has already been sent to her pharmacy. . A clearance letter has been faxed to the requesting surgeon. . Please contact the surgeon's office to ensure it has been received. . This phone note will be removed from the preop pool. Richardson Dopp, PA-C    03/05/2018 9:50 AM

## 2018-03-05 NOTE — Telephone Encounter (Signed)
[place] office has received the clearance letter on the pt. I have also s/w the pt and she is aware she will need to take Amoxicillin 2000 mg 30-60 minutes prior to dental appt. Rx has been sent in.  I will remove from the Pre Op Call Back Pool.

## 2018-03-06 NOTE — Progress Notes (Signed)
Cardiac Individual Treatment Plan  Patient Details  Name: Whitney [name] MRN: [id number] Date of Birth: 10/19/1955 Referring Provider:     CARDIAC REHAB PHASE II ORIENTATION from 02/06/2018 in [place]  Referring Provider  Thamas Jaegers MD       Initial Encounter Date:    CARDIAC REHAB PHASE II ORIENTATION from 02/06/2018 in [place]  Date  02/06/18      Visit Diagnosis: S/P TAVR (transcatheter aortic valve replacement)  Patient's Home Medications on Admission:  Current Outpatient Medications:  .  amoxicillin (AMOXIL) 500 MG capsule, Take 4 capsules by mouth 1 hour prior to dental appointment., Disp: 4 capsule, Rfl: 3 .  aspirin EC 81 MG EC tablet, Take 1 tablet (81 mg total) by mouth daily., Disp: , Rfl:  .  ezetimibe (ZETIA) 10 MG tablet, Take 10 mg by mouth at bedtime. , Disp: , Rfl: 11 .  OVER THE COUNTER MEDICATION, Take 1 packet by mouth 2 (two) times daily. Bariatric Multivitamin, Disp: , Rfl:   Past Medical History: Past Medical History:  Diagnosis Date  . Allergic rhinitis   . Anemia    hx  . Arthritis   . Asthma    hx yrs ago  . Cirrhosis ([place]) last albumin 3.3 done at [facility] 06-16-2014 (under care everywhere tab in epic)   Secondary to Fatty liver --  followed by hepatology at [facility] (dr Gerald Dexter)   . Depression   . Diabetes mellitus type II    type 2 diet conrolled  . Dyspnea   . Fibromyalgia   . GERD (gastroesophageal reflux disease)   . Heart murmur    asymptomatic ---  1989 from rhuematic fever  . History of exercise stress test    05-05-2013---  negative bruce ETT given exercise workload,  no ischemia  . History of hiatal hernia   . History of kidney stones   . History of rheumatic fever    1989  . Hyperlipidemia   . Leukocytopenia   . Moderate aortic stenosis    AVA area 1.1cm2---  cardiologist --  dr Concepcion Living, 2014 in epic  . NASH (nonalcoholic steatohepatitis)   . OSA  (obstructive sleep apnea)    was using CPAP before gastric sleeve 2015--  no uses after wt loss  . Pneumonia    hx  . Sjogren's syndrome ([place])   . Thrombocytopenia ([place])     Tobacco Use: Social History   Tobacco Use  Smoking Status Never Smoker  Smokeless Tobacco Never Used    Labs: Recent Review Flowsheet Data    Labs for ITP Cardiac and Pulmonary Rehab Latest Ref Rng & Units 12/05/2017 12/05/2017 12/05/2017 12/05/2017 12/05/2017   Hemoglobin A1c 4.8 - 5.6 % - - - - -   PHART 7.350 - 7.450 - 7.430 7.349(L) - 7.326(L)   PCO2ART 32.0 - 48.0 mmHg - 38.9 41.7 - 43.5   HCO3 20.0 - 28.0 mmol/L - 25.8 23.2 - 22.8   TCO2 22 - 32 mmol/L 26 27 25 23 24    ACIDBASEDEF 0.0 - 2.0 mmol/L - - 3.0(H) - 3.0(H)   O2SAT % - 100.0 97.0 - 97.0      Capillary Blood Glucose: Lab Results  Component Value Date   GLUCAP 79 02/12/2018   GLUCAP 100 (H) 12/12/2017   GLUCAP 166 (H) 12/11/2017   GLUCAP 161 (H) 12/11/2017   GLUCAP 114 (H) 12/11/2017     Exercise Target Goals: Exercise  Program Goal: Individual exercise prescription set using results from initial 6 min walk test and THRR while considering  patient's activity barriers and safety.   Exercise Prescription Goal: Initial exercise prescription builds to 30-45 minutes a day of aerobic activity, 2-3 days per week.  Home exercise guidelines will be given to patient during program as part of exercise prescription that the participant will acknowledge.  Activity Barriers & Risk Stratification: Activity Barriers & Cardiac Risk Stratification - 02/06/18 1139      Activity Barriers & Cardiac Risk Stratification   Activity Barriers  Fibromyalgia;Muscular Weakness;Deconditioning;Incisional Pain    Cardiac Risk Stratification  High       6 Minute Walk: 6 Minute Walk    Row Name 02/06/18 1137         6 Minute Walk   Phase  Initial     Distance  1405 feet     Walk Time  6 minutes     # of Rest Breaks  0     MPH  2.66     METS  3.04     RPE  12      Perceived Dyspnea   1     VO2 Peak  10.67     Symptoms  Yes (comment)     Comments  Mild SOB +1     Resting HR  76 bpm     Resting BP  138/68     Resting Oxygen Saturation   97 %     Exercise Oxygen Saturation  during 6 min walk  95 %     Max Ex. HR  105 bpm     Max Ex. BP  148/70     2 Minute Post BP  128/80        Oxygen Initial Assessment:   Oxygen Re-Evaluation:   Oxygen Discharge (Final Oxygen Re-Evaluation):   Initial Exercise Prescription: Initial Exercise Prescription - 02/06/18 1100      Date of Initial Exercise RX and Referring Provider   Date  02/06/18    Referring Provider  Thamas Jaegers MD     Expected Discharge Date  05/16/17      Treadmill   MPH  2.3    Grade  1    Minutes  10    METs  3.08      Recumbant Bike   Level  1.5    Watts  20    Minutes  10    METs  2.79      NuStep   Level  2    SPM  75    Minutes  10    METs  2.8      Prescription Details   Frequency (times per week)  3x    Duration  Progress to 30 minutes of continuous aerobic without signs/symptoms of physical distress      Intensity   THRR 40-80% of Max Heartrate  62-125    Ratings of Perceived Exertion  11-13    Perceived Dyspnea  0-4      Progression   Progression  Continue progressive overload as per policy without signs/symptoms or physical distress.      Resistance Training   Training Prescription  Yes    Weight  2lbs    Reps  10-15       Perform Capillary Blood Glucose checks as needed.  Exercise Prescription Changes: Exercise Prescription Changes    Row Name 02/12/18 1700 03/03/18 1420  Response to Exercise   Blood Pressure (Admit)  110/70  123/88      Blood Pressure (Exercise)  152/84  140/80      Blood Pressure (Exit)  130/80  130/84      Heart Rate (Admit)  80 bpm  92 bpm      Heart Rate (Exercise)  115 bpm  114 bpm      Heart Rate (Exit)  90 bpm  86 bpm      Rating of Perceived Exertion (Exercise)  12  13      Perceived  Dyspnea (Exercise)  0  0      Symptoms  None  None      Comments  Pt oriented to exercise equipment, pt arrived late  None      Duration  Progress to 30 minutes of  aerobic without signs/symptoms of physical distress  Progress to 30 minutes of  aerobic without signs/symptoms of physical distress      Intensity  THRR New  THRR unchanged        Progression   Progression  Continue to progress workloads to maintain intensity without signs/symptoms of physical distress.  Continue to progress workloads to maintain intensity without signs/symptoms of physical distress.      Average METs  2.59  2.47        Resistance Training   Training Prescription  No  Yes      Weight  -  3lb      Reps  -  10-15      Time  -  10 Minutes        Interval Training   Interval Training  No  No        Treadmill   MPH  1.7  2      Grade  1  1      Minutes  10  10      METs  2.32  2.81        Recumbant Bike   Level  1.5  1.5      Minutes  10  10      METs  2.1  1.6        NuStep   Level  -  3      SPM  -  85      Minutes  -  10      METs  -  3         Exercise Comments: Exercise Comments    Row Name 02/12/18 1710 03/06/18 1418         Exercise Comments  Pt's first day of exericse. Pt oriented to exercise equipment. Pt responded well to workloads. Pt is very hesistant to start exericse plan, pt states she "hates exercise". Will continue to work with pt and progress as tolerated.   Pt is responding well to exercise prescription. Pt puts forth minimal effort with exercise. Pt is not happy about exercise. Will follow up with pt regarding home exercise.          Exercise Goals and Review: Exercise Goals    Row Name 02/06/18 1004             Exercise Goals   Increase Physical Activity  Yes       Intervention  Provide advice, education, support and counseling about physical activity/exercise needs.;Develop an individualized exercise prescription for aerobic and resistive training based on initial  evaluation findings, risk stratification, comorbidities and participant's personal goals.  Expected Outcomes  Short Term: Attend rehab on a regular basis to increase amount of physical activity.;Long Term: Exercising regularly at least 3-5 days a week.;Long Term: Add in home exercise to make exercise part of routine and to increase amount of physical activity.       Increase Strength and Stamina  Yes       Intervention  Provide advice, education, support and counseling about physical activity/exercise needs.;Develop an individualized exercise prescription for aerobic and resistive training based on initial evaluation findings, risk stratification, comorbidities and participant's personal goals.       Expected Outcomes  Short Term: Increase workloads from initial exercise prescription for resistance, speed, and METs.;Short Term: Perform resistance training exercises routinely during rehab and add in resistance training at home;Long Term: Improve cardiorespiratory fitness, muscular endurance and strength as measured by increased METs and functional capacity (6MWT)       Able to understand and use rate of perceived exertion (RPE) scale  Yes       Intervention  Provide education and explanation on how to use RPE scale       Expected Outcomes  Short Term: Able to use RPE daily in rehab to express subjective intensity level;Long Term:  Able to use RPE to guide intensity level when exercising independently       Knowledge and understanding of Target Heart Rate Range (THRR)  Yes       Intervention  Provide education and explanation of THRR including how the numbers were predicted and where they are located for reference       Expected Outcomes  Short Term: Able to state/look up THRR;Long Term: Able to use THRR to govern intensity when exercising independently;Short Term: Able to use daily as guideline for intensity in rehab       Able to check pulse independently  Yes       Intervention  Provide education  and demonstration on how to check pulse in carotid and radial arteries.;Review the importance of being able to check your own pulse for safety during independent exercise       Expected Outcomes  Short Term: Able to explain why pulse checking is important during independent exercise;Long Term: Able to check pulse independently and accurately       Understanding of Exercise Prescription  Yes       Intervention  Provide education, explanation, and written materials on patient's individual exercise prescription       Expected Outcomes  Short Term: Able to explain program exercise prescription;Long Term: Able to explain home exercise prescription to exercise independently          Exercise Goals Re-Evaluation :   Discharge Exercise Prescription (Final Exercise Prescription Changes): Exercise Prescription Changes - 03/03/18 1420      Response to Exercise   Blood Pressure (Admit)  123/88    Blood Pressure (Exercise)  140/80    Blood Pressure (Exit)  130/84    Heart Rate (Admit)  92 bpm    Heart Rate (Exercise)  114 bpm    Heart Rate (Exit)  86 bpm    Rating of Perceived Exertion (Exercise)  13    Perceived Dyspnea (Exercise)  0    Symptoms  None    Comments  None    Duration  Progress to 30 minutes of  aerobic without signs/symptoms of physical distress    Intensity  THRR unchanged      Progression   Progression  Continue to progress workloads to maintain intensity  without signs/symptoms of physical distress.    Average METs  2.47      Resistance Training   Training Prescription  Yes    Weight  3lb    Reps  10-15    Time  10 Minutes      Interval Training   Interval Training  No      Treadmill   MPH  2    Grade  1    Minutes  10    METs  2.81      Recumbant Bike   Level  1.5    Minutes  10    METs  1.6      NuStep   Level  3    SPM  85    Minutes  10    METs  3       Nutrition:  Target Goals: Understanding of nutrition guidelines, daily intake of sodium <1572m,  cholesterol <2070m calories 30% from fat and 7% or less from saturated fats, daily to have 5 or more servings of fruits and vegetables.  Biometrics: Pre Biometrics - 02/06/18 1139      Pre Biometrics   Height  5' 4"  (1.626 m)    Weight  97 kg    Waist Circumference  39.5 inches    Hip Circumference  41 inches    Waist to Hip Ratio  0.96 %    BMI (Calculated)  36.69    Triceps Skinfold  41 mm    % Body Fat  47.1 %    Grip Strength  36 kg    Flexibility  15.5 in    Single Leg Stand  7.22 seconds        Nutrition Therapy Plan and Nutrition Goals: Nutrition Therapy & Goals - 02/06/18 1122      Nutrition Therapy   Diet  heart healthy      Personal Nutrition Goals   Nutrition Goal  Pt to identify and limit food sources of saturated fat, trans fat, refined carbohydrates and sodium    Personal Goal #2  Pt to identify food quantities necessary to achieve weight loss of 6-24 lbs. at graduation from cardiac rehab. Goal wt loss of 20 lb desired.     Personal Goal #3  Pt to foods in order of lean protein, non-starchy veggies +healthy fat, then complex carbohydrate     Personal Goal #4  Pt to eat more frequently across the day, 5-6 smaller meals      Intervention Plan   Intervention  Prescribe, educate and counsel regarding individualized specific dietary modifications aiming towards targeted core components such as weight, hypertension, lipid management, diabetes, heart failure and other comorbidities.    Expected Outcomes  Short Term Goal: Understand basic principles of dietary content, such as calories, fat, sodium, cholesterol and nutrients.;Long Term Goal: Adherence to prescribed nutrition plan.       Nutrition Assessments: Nutrition Assessments - 02/06/18 1123      MEDFICTS Scores   Pre Score  21       Nutrition Goals Re-Evaluation:   Nutrition Goals Re-Evaluation:   Nutrition Goals Discharge (Final Nutrition Goals Re-Evaluation):   Psychosocial: Target Goals:  Acknowledge presence or absence of significant depression and/or stress, maximize coping skills, provide positive support system. Participant is able to verbalize types and ability to use techniques and skills needed for reducing stress and depression.  Initial Review & Psychosocial Screening: Initial Psych Review & Screening - 02/06/18 1145      Initial Review  Current issues with  History of Depression   No current issues.      Family Dynamics   Good Support System?  Yes   Pt reports her children and grandchildren are sources of support for her.      Barriers   Psychosocial barriers to participate in program  There are no identifiable barriers or psychosocial needs.       Quality of Life Scores: Quality of Life - 02/06/18 1147      Quality of Life Scores   Health/Function Pre  23.07 %    Socioeconomic Pre  27 %    Psych/Spiritual Pre  24.33 %    Family Pre  25.5 %    GLOBAL Pre  24.52 %      Scores of 19 and below usually indicate a poorer quality of life in these areas.  A difference of  2-3 points is a clinically meaningful difference.  A difference of 2-3 points in the total score of the Quality of Life Index has been associated with significant improvement in overall quality of life, self-image, physical symptoms, and general health in studies assessing change in quality of life.  PHQ-9: Recent Review Flowsheet Data    Depression screen [facility] 2/9 02/12/2018   Decreased Interest 0   Down, Depressed, Hopeless 0   PHQ - 2 Score 0     Interpretation of Total Score  Total Score Depression Severity:  1-4 = Minimal depression, 5-9 = Mild depression, 10-14 = Moderate depression, 15-19 = Moderately severe depression, 20-27 = Severe depression   Psychosocial Evaluation and Intervention: Psychosocial Evaluation - 02/12/18 1643      Psychosocial Evaluation & Interventions   Interventions  Encouraged to exercise with the program and follow exercise prescription    Comments   Whitney [name] reports no depression or hopelessness in the last two weeks.  She enjoys working in her yard, cooking, and going to [employer].     Expected Outcomes  Whitney [name] will maintain a positive outlook.     Continue Psychosocial Services   No Follow up required       Psychosocial Re-Evaluation: Psychosocial Re-Evaluation    [place] Name 03/05/18 1449             Psychosocial Re-Evaluation   Current issues with  None Identified       Comments  No current psychosoical needs.        Expected Outcomes  Whitney [name] will continue to exhibit a positive outlook.        Interventions  Encouraged to attend Cardiac Rehabilitation for the exercise       Continue Psychosocial Services   No Follow up required          Psychosocial Discharge (Final Psychosocial Re-Evaluation): Psychosocial Re-Evaluation - 03/05/18 1449      Psychosocial Re-Evaluation   Current issues with  None Identified    Comments  No current psychosoical needs.     Expected Outcomes  Whitney [name] will continue to exhibit a positive outlook.     Interventions  Encouraged to attend Cardiac Rehabilitation for the exercise    Continue Psychosocial Services   No Follow up required       Vocational Rehabilitation: Provide vocational rehab assistance to qualifying candidates.   Vocational Rehab Evaluation & Intervention: Vocational Rehab - 02/06/18 1147      Initial Vocational Rehab Evaluation & Intervention   Assessment shows need for Vocational Rehabilitation  No       Education: Education Goals:  Education classes will be provided on a weekly basis, covering required topics. Participant will state understanding/return demonstration of topics presented.  Learning Barriers/Preferences: Learning Barriers/Preferences - 02/06/18 1142      Learning Barriers/Preferences   Learning Barriers  Sight    Learning Preferences  Video;Skilled Demonstration;Written Material       Education Topics: Count Your Pulse:  -Group  instruction provided by verbal instruction, demonstration, patient participation and written materials to support subject.  Instructors address importance of being able to find your pulse and how to count your pulse when at home without a heart monitor.  Patients get hands on experience counting their pulse with staff help and individually.   Heart Attack, Angina, and Risk Factor Modification:  -Group instruction provided by verbal instruction, video, and written materials to support subject.  Instructors address signs and symptoms of angina and heart attacks.    Also discuss risk factors for heart disease and how to make changes to improve heart health risk factors.   Functional Fitness:  -Group instruction provided by verbal instruction, demonstration, patient participation, and written materials to support subject.  Instructors address safety measures for doing things around the house.  Discuss how to get up and down off the floor, how to pick things up properly, how to safely get out of a chair without assistance, and balance training.   Meditation and Mindfulness:  -Group instruction provided by verbal instruction, patient participation, and written materials to support subject.  Instructor addresses importance of mindfulness and meditation practice to help reduce stress and improve awareness.  Instructor also leads participants through a meditation exercise.    Stretching for Flexibility and Mobility:  -Group instruction provided by verbal instruction, patient participation, and written materials to support subject.  Instructors lead participants through series of stretches that are designed to increase flexibility thus improving mobility.  These stretches are additional exercise for major muscle groups that are typically performed during regular warm up and cool down.   Hands Only CPR:  -Group verbal, video, and participation provides a basic overview of AHA guidelines for community CPR.  Role-play of emergencies allow participants the opportunity to practice calling for help and chest compression technique with discussion of AED use.   Hypertension: -Group verbal and written instruction that provides a basic overview of hypertension including the most recent diagnostic guidelines, risk factor reduction with self-care instructions and medication management.    Nutrition I class: Heart Healthy Eating:  -Group instruction provided by PowerPoint slides, verbal discussion, and written materials to support subject matter. The instructor gives an explanation and review of the Therapeutic Lifestyle Changes diet recommendations, which includes a discussion on lipid goals, dietary fat, sodium, fiber, plant stanol/sterol esters, sugar, and the components of a well-balanced, healthy diet.   Nutrition II class: Lifestyle Skills:  -Group instruction provided by PowerPoint slides, verbal discussion, and written materials to support subject matter. The instructor gives an explanation and review of label reading, grocery shopping for heart health, heart healthy recipe modifications, and ways to make healthier choices when eating out.   Diabetes Question & Answer:  -Group instruction provided by PowerPoint slides, verbal discussion, and written materials to support subject matter. The instructor gives an explanation and review of diabetes co-morbidities, pre- and post-prandial blood glucose goals, pre-exercise blood glucose goals, signs, symptoms, and treatment of hypoglycemia and hyperglycemia, and foot care basics.   Diabetes Blitz:  -Group instruction provided by PowerPoint slides, verbal discussion, and written materials to support subject matter. The instructor  gives an explanation and review of the physiology behind type 1 and type 2 diabetes, diabetes medications and rational behind using different medications, pre- and post-prandial blood glucose recommendations and Hemoglobin A1c goals,  diabetes diet, and exercise including blood glucose guidelines for exercising safely.    Portion Distortion:  -Group instruction provided by PowerPoint slides, verbal discussion, written materials, and food models to support subject matter. The instructor gives an explanation of serving size versus portion size, changes in portions sizes over the last 20 years, and what consists of a serving from each food group.   Stress Management:  -Group instruction provided by verbal instruction, video, and written materials to support subject matter.  Instructors review role of stress in heart disease and how to cope with stress positively.     Exercising on Your Own:  -Group instruction provided by verbal instruction, power point, and written materials to support subject.  Instructors discuss benefits of exercise, components of exercise, frequency and intensity of exercise, and end points for exercise.  Also discuss use of nitroglycerin and activating EMS.  Review options of places to exercise outside of rehab.  Review guidelines for sex with heart disease.   Cardiac Drugs I:  -Group instruction provided by verbal instruction and written materials to support subject.  Instructor reviews cardiac drug classes: antiplatelets, anticoagulants, beta blockers, and statins.  Instructor discusses reasons, side effects, and lifestyle considerations for each drug class.   Cardiac Drugs II:  -Group instruction provided by verbal instruction and written materials to support subject.  Instructor reviews cardiac drug classes: angiotensin converting enzyme inhibitors (ACE-I), angiotensin II receptor blockers (ARBs), nitrates, and calcium channel blockers.  Instructor discusses reasons, side effects, and lifestyle considerations for each drug class.   Anatomy and Physiology of the Circulatory System:  Group verbal and written instruction and models provide basic cardiac anatomy and physiology, with the coronary  electrical and arterial systems. Review of: AMI, Angina, Valve disease, Heart Failure, Peripheral Artery Disease, Cardiac Arrhythmia, Pacemakers, and the ICD.   Other Education:  -Group or individual verbal, written, or video instructions that support the educational goals of the cardiac rehab program.   Holiday Eating Survival Tips:  -Group instruction provided by PowerPoint slides, verbal discussion, and written materials to support subject matter. The instructor gives patients tips, tricks, and techniques to help them not only survive but enjoy the holidays despite the onslaught of food that accompanies the holidays.   Knowledge Questionnaire Score: Knowledge Questionnaire Score - 02/06/18 1048      Knowledge Questionnaire Score   Pre Score  21/24       Core Components/Risk Factors/Patient Goals at Admission: Personal Goals and Risk Factors at Admission - 02/06/18 1141      Core Components/Risk Factors/Patient Goals on Admission    Weight Management  Yes;Weight Loss    Intervention  Weight Management: Develop a combined nutrition and exercise program designed to reach desired caloric intake, while maintaining appropriate intake of nutrient and fiber, sodium and fats, and appropriate energy expenditure required for the weight goal.;Weight Management: Provide education and appropriate resources to help participant work on and attain dietary goals.;Weight Management/Obesity: Establish reasonable short term and long term weight goals.;Obesity: Provide education and appropriate resources to help participant work on and attain dietary goals.    Admit Weight  213 lb 13.5 oz (97 kg)    Goal Weight: Long Term  200 lb (90.7 kg)    Expected Outcomes  Long Term: Adherence to nutrition and physical activity/exercise  program aimed toward attainment of established weight goal;Short Term: Continue to assess and modify interventions until short term weight is achieved;Weight Loss: Understanding of  general recommendations for a balanced deficit meal plan, which promotes 1-2 lb weight loss per week and includes a negative energy balance of [id number] kcal/d;Understanding recommendations for meals to include 15-35% energy as protein, 25-35% energy from fat, 35-60% energy from carbohydrates, less than 250m of dietary cholesterol, 20-35 gm of total fiber daily;Understanding of distribution of calorie intake throughout the day with the consumption of 4-5 meals/snacks    Diabetes  Yes    Intervention  Provide education about signs/symptoms and action to take for hypo/hyperglycemia.;Provide education about proper nutrition, including hydration, and aerobic/resistive exercise prescription along with prescribed medications to achieve blood glucose in normal ranges: Fasting glucose 65-99 mg/dL    Expected Outcomes  Short Term: Participant verbalizes understanding of the signs/symptoms and immediate care of hyper/hypoglycemia, proper foot care and importance of medication, aerobic/resistive exercise and nutrition plan for blood glucose control.;Long Term: Attainment of HbA1C < 7%.    Hypertension  Yes    Intervention  Provide education on lifestyle modifcations including regular physical activity/exercise, weight management, moderate sodium restriction and increased consumption of fresh fruit, vegetables, and low fat dairy, alcohol moderation, and smoking cessation.;Monitor prescription use compliance.    Expected Outcomes  Short Term: Continued assessment and intervention until BP is < 140/92mHG in hypertensive participants. < 130/808mG in hypertensive participants with diabetes, heart failure or chronic kidney disease.;Long Term: Maintenance of blood pressure at goal levels.    Lipids  Yes    Intervention  Provide education and support for participant on nutrition & aerobic/resistive exercise along with prescribed medications to achieve LDL <73m37mDL >40mg68m Expected Outcomes  Short Term: Participant  states understanding of desired cholesterol values and is compliant with medications prescribed. Participant is following exercise prescription and nutrition guidelines.;Long Term: Cholesterol controlled with medications as prescribed, with individualized exercise RX and with personalized nutrition plan. Value goals: LDL < 73mg,64m > 40 mg.       Core Components/Risk Factors/Patient Goals Review:  Goals and Risk Factor Review    Row Name 02/12/18 1653 03/05/18 1450           Core Components/Risk Factors/Patient Goals Review   Personal Goals Review  Lipids;Hypertension;Diabetes;Weight Management/Obesity  Lipids;Hypertension;Diabetes;Weight Management/Obesity      Review  Pt with multiple CAD RFs participating in CR.  Pt states "I hate exercise." However, Lawren would like to increase her energy and stamina.  Pt with multiple CAD RFs participating in CR.  Whitney [name] is tolerating exercise well.  Despite "not liking exercise" she continues to demonstrate willingness to come.       Expected Outcomes  Pt will continue to participate in CR exercise, nutrition, and lifestyle modification opportunities.   Pt will continue to participate in CR exercise, nutrition, and lifestyle modification opportunities.          Core Components/Risk Factors/Patient Goals at Discharge (Final Review):  Goals and Risk Factor Review - 03/05/18 1450      Core Components/Risk Factors/Patient Goals Review   Personal Goals Review  Lipids;Hypertension;Diabetes;Weight Management/Obesity    Review  Pt with multiple CAD RFs participating in CR.  Whitney [name] is tolerating exercise well.  Despite "not liking exercise" she continues to demonstrate willingness to come.     Expected Outcomes  Pt will continue to participate in CR exercise, nutrition, and lifestyle modification opportunities.  ITP Comments: ITP Comments    Row Name 02/06/18 1003 02/12/18 1636 03/05/18 1447       ITP Comments  Dr. Fransico Him, Medical Director   Pt  started exercise today and tolerated it well.   30 Day ITP Review. Pt is tolerating exercise well.  Pt demonstrates willingness to exercise despite verbalization of not liking exercise. She does note an improvement in her stamina.         Comments: See ITP Comments.

## 2018-03-07 ENCOUNTER — Encounter (HOSPITAL_COMMUNITY): Payer: Medicare Other

## 2018-03-07 ENCOUNTER — Encounter (HOSPITAL_COMMUNITY)
Admission: RE | Admit: 2018-03-07 | Discharge: 2018-03-07 | Disposition: A | Discharge status: Home / Self Care | Payer: Medicare Other | Source: Ambulatory Visit | Attending: Interventional Cardiology | Admitting: Interventional Cardiology

## 2018-03-07 DIAGNOSIS — M797 Fibromyalgia: Secondary | ICD-10-CM | POA: Diagnosis not present

## 2018-03-07 DIAGNOSIS — K219 Gastro-esophageal reflux disease without esophagitis: Secondary | ICD-10-CM | POA: Insufficient documentation

## 2018-03-07 DIAGNOSIS — E119 Type 2 diabetes mellitus without complications: Secondary | ICD-10-CM | POA: Diagnosis not present

## 2018-03-07 DIAGNOSIS — E785 Hyperlipidemia, unspecified: Secondary | ICD-10-CM | POA: Insufficient documentation

## 2018-03-07 DIAGNOSIS — Z7982 Long term (current) use of aspirin: Secondary | ICD-10-CM | POA: Diagnosis not present

## 2018-03-07 DIAGNOSIS — Z952 Presence of prosthetic heart valve: Secondary | ICD-10-CM | POA: Insufficient documentation

## 2018-03-07 DIAGNOSIS — I350 Nonrheumatic aortic (valve) stenosis: Secondary | ICD-10-CM | POA: Insufficient documentation

## 2018-03-07 DIAGNOSIS — Z79899 Other long term (current) drug therapy: Secondary | ICD-10-CM | POA: Insufficient documentation

## 2018-03-07 DIAGNOSIS — G4733 Obstructive sleep apnea (adult) (pediatric): Secondary | ICD-10-CM | POA: Insufficient documentation

## 2018-03-07 DIAGNOSIS — M3500 Sicca syndrome, unspecified: Secondary | ICD-10-CM | POA: Insufficient documentation

## 2018-03-10 ENCOUNTER — Encounter (HOSPITAL_COMMUNITY): Payer: Medicare Other

## 2018-03-10 ENCOUNTER — Encounter (HOSPITAL_COMMUNITY)
Admission: RE | Admit: 2018-03-10 | Discharge: 2018-03-10 | Disposition: A | Discharge status: Home / Self Care | Payer: Medicare Other | Source: Ambulatory Visit | Attending: Interventional Cardiology | Admitting: Interventional Cardiology

## 2018-03-10 DIAGNOSIS — Z952 Presence of prosthetic heart valve: Secondary | ICD-10-CM

## 2018-03-11 NOTE — Progress Notes (Signed)
[name] have reviewed a Home Exercise Prescription with Whitney [name] . Whitney [name] is not currently exercising at home. The patient was advised to walk 2-3 days a week for 30-45 minutes.  Whitney [name] discussed how to progress Whitney [name] exercise prescription.  The patient stated that they understand the exercise prescription. Pt does not enjoy exercising, but is willing to incorporate some walking into her schedule. We reviewed exercise guidelines, target heart rate during exercise, RPE Scale, weather conditions, NTG use, endpoints for exercise, warmup and cool down. Patient is encouraged to come to me with any questions. [name] will continue to follow up with the patient to assist them with progression and safety.      Whitney Lair MS, ACSM CEP 7:46 AM 03/11/2018

## 2018-03-12 ENCOUNTER — Encounter (HOSPITAL_COMMUNITY): Payer: Medicare Other

## 2018-03-12 ENCOUNTER — Encounter (HOSPITAL_COMMUNITY)
Admission: RE | Admit: 2018-03-12 | Discharge: 2018-03-12 | Disposition: A | Discharge status: Home / Self Care | Payer: Medicare Other | Source: Ambulatory Visit | Attending: Interventional Cardiology | Admitting: Interventional Cardiology

## 2018-03-12 DIAGNOSIS — Z952 Presence of prosthetic heart valve: Secondary | ICD-10-CM

## 2018-03-14 ENCOUNTER — Encounter (HOSPITAL_COMMUNITY): Payer: Medicare Other

## 2018-03-17 ENCOUNTER — Encounter (HOSPITAL_COMMUNITY): Payer: Medicare Other

## 2018-03-17 ENCOUNTER — Encounter (HOSPITAL_COMMUNITY)
Admission: RE | Admit: 2018-03-17 | Discharge: 2018-03-17 | Disposition: A | Discharge status: Home / Self Care | Payer: Medicare Other | Source: Ambulatory Visit | Attending: Interventional Cardiology | Admitting: Interventional Cardiology

## 2018-03-17 DIAGNOSIS — Z952 Presence of prosthetic heart valve: Secondary | ICD-10-CM

## 2018-03-17 NOTE — Progress Notes (Signed)
Whitney [name] 62 y.o. female Nutrition Note Spoke with pt. Nutrition plan and goals reviewed with pt. Pt is following a heart healthy diet. Pt wants to lose ~20 lbs. Pt has not actively been trying to lose wt. Wt loss tips reviewed (label reading, how to build a healthy plate, portion sizes of foods eaten, eating frequently across the day, and developing a set schedule for eating smaller meals). Pt had a gastric sleeve in 2015, recommended returning to the basics of nutrition post bariatric surgery, eating smaller portions, avoiding slider foods, order of foods eaten (lean protein, non-starchy veggies+ healthy fat, then complex carbohydrate, etc.), and focusing on getting in an adequate amount of protein. Pt has Type 2 Diabetes. Last A1c indicates blood glucose well-controlled. Pt shared that her A1c changed dramatically after her gastric sleeve, d/t she does not check CBG's (as she is diet controlled). Per discussion, pt does not not use canned/convenience foods often. Pt does not add salt to food. Pt does not eat out frequently. Pt expressed understanding of the information reviewed. Pt aware of nutrition education classes offered and would like to attend nutrition classes.  Lab Results  Component Value Date   HGBA1C 5.5 12/03/2017    Wt Readings from Last 3 Encounters:  02/26/18 215 lb (97.5 kg)  02/06/18 213 lb 13.5 oz (97 kg)  01/24/18 215 lb (97.5 kg)    Nutrition Diagnosis ? Food-and nutrition-related knowledge deficit related to lack of exposure to information as related to diagnosis of: ? CVD ? Type 2 Diabetes  Nutrition Intervention ? Pt's individual nutrition plan reviewed with pt. ? Benefits of adopting Heart Healthy diet discussed when [employer] reviewed.  Goal(s)  Pt to identify and limit food sources of saturated fat, trans fat, refined carbohydrates and sodium  Pt to identify food quantities necessary to achieve weight loss of 6-24 lbs. at graduation from cardiac rehab. Goal  wt loss of 20 lb desired.   Pt to foods in order of lean protein, non-starchy veggies +healthy fat, then complex carbohydrate   Pt to eat more frequently across the day, 5-6 smaller meals  Plan:   Pt to attend nutrition classes ? Nutrition I ? Nutrition II ? Portion Distortion   Will provide client-centered nutrition education as part of interdisciplinary care  Monitor and evaluate progress toward nutrition goal with team.    Laurina Bustle, MS, RD, LDN 03/17/2018 12:05 PM

## 2018-03-19 ENCOUNTER — Encounter (HOSPITAL_COMMUNITY): Payer: Medicare Other

## 2018-03-21 ENCOUNTER — Encounter (HOSPITAL_COMMUNITY)
Admission: RE | Admit: 2018-03-21 | Discharge: 2018-03-21 | Disposition: A | Discharge status: Home / Self Care | Payer: Medicare Other | Source: Ambulatory Visit | Attending: Interventional Cardiology | Admitting: Interventional Cardiology

## 2018-03-21 ENCOUNTER — Encounter (HOSPITAL_COMMUNITY): Payer: Medicare Other

## 2018-03-21 DIAGNOSIS — Z952 Presence of prosthetic heart valve: Secondary | ICD-10-CM | POA: Diagnosis not present

## 2018-03-24 ENCOUNTER — Encounter (HOSPITAL_COMMUNITY)
Admission: RE | Admit: 2018-03-24 | Discharge: 2018-03-24 | Disposition: A | Discharge status: Home / Self Care | Payer: Medicare Other | Source: Ambulatory Visit | Attending: Interventional Cardiology | Admitting: Interventional Cardiology

## 2018-03-24 ENCOUNTER — Encounter (HOSPITAL_COMMUNITY): Payer: Medicare Other

## 2018-03-24 DIAGNOSIS — Z952 Presence of prosthetic heart valve: Secondary | ICD-10-CM

## 2018-03-26 ENCOUNTER — Encounter (HOSPITAL_COMMUNITY)
Admission: RE | Admit: 2018-03-26 | Discharge: 2018-03-26 | Disposition: A | Discharge status: Home / Self Care | Payer: Medicare Other | Source: Ambulatory Visit | Attending: Interventional Cardiology | Admitting: Interventional Cardiology

## 2018-03-26 ENCOUNTER — Encounter (HOSPITAL_COMMUNITY): Payer: Self-pay

## 2018-03-26 ENCOUNTER — Encounter (HOSPITAL_COMMUNITY): Payer: Medicare Other

## 2018-03-26 DIAGNOSIS — Z952 Presence of prosthetic heart valve: Secondary | ICD-10-CM | POA: Diagnosis not present

## 2018-03-27 NOTE — Progress Notes (Signed)
Cardiac Individual Treatment Plan  Patient Details  Name: Whitney [name] MRN: [id number] Date of Birth: Sep 11, 1955 Referring Provider:     CARDIAC REHAB PHASE II ORIENTATION from 02/06/2018 in [place]  Referring Provider  Thamas Jaegers MD       Initial Encounter Date:    CARDIAC REHAB PHASE II ORIENTATION from 02/06/2018 in [place]  Date  02/06/18      Visit Diagnosis: S/P TAVR (transcatheter aortic valve replacement)  Patient's Home Medications on Admission:  Current Outpatient Medications:  .  amoxicillin (AMOXIL) 500 MG capsule, Take 4 capsules by mouth 1 hour prior to dental appointment., Disp: 4 capsule, Rfl: 3 .  aspirin EC 81 MG EC tablet, Take 1 tablet (81 mg total) by mouth daily., Disp: , Rfl:  .  ezetimibe (ZETIA) 10 MG tablet, Take 10 mg by mouth at bedtime. , Disp: , Rfl: 11 .  OVER THE COUNTER MEDICATION, Take 1 packet by mouth 2 (two) times daily. Bariatric Multivitamin, Disp: , Rfl:   Past Medical History: Past Medical History:  Diagnosis Date  . Allergic rhinitis   . Anemia    hx  . Arthritis   . Asthma    hx yrs ago  . Cirrhosis ([place]) last albumin 3.3 done at [facility] 06-16-2014 (under care everywhere tab in epic)   Secondary to Fatty liver --  followed by hepatology at [facility] (dr Gerald Dexter)   . Depression   . Diabetes mellitus type II    type 2 diet conrolled  . Dyspnea   . Fibromyalgia   . GERD (gastroesophageal reflux disease)   . Heart murmur    asymptomatic ---  1989 from rhuematic fever  . History of exercise stress test    05-05-2013---  negative bruce ETT given exercise workload,  no ischemia  . History of hiatal hernia   . History of kidney stones   . History of rheumatic fever    1989  . Hyperlipidemia   . Leukocytopenia   . Moderate aortic stenosis    AVA area 1.1cm2---  cardiologist --  dr Concepcion Living, 2014 in epic  . NASH (nonalcoholic steatohepatitis)   . OSA  (obstructive sleep apnea)    was using CPAP before gastric sleeve 2015--  no uses after wt loss  . Pneumonia    hx  . Sjogren's syndrome ([place])   . Thrombocytopenia ([place])     Tobacco Use: Social History   Tobacco Use  Smoking Status Never Smoker  Smokeless Tobacco Never Used    Labs: Recent Review Flowsheet Data    Labs for ITP Cardiac and Pulmonary Rehab Latest Ref Rng & Units 12/05/2017 12/05/2017 12/05/2017 12/05/2017 12/05/2017   Hemoglobin A1c 4.8 - 5.6 % - - - - -   PHART 7.350 - 7.450 - 7.430 7.349(L) - 7.326(L)   PCO2ART 32.0 - 48.0 mmHg - 38.9 41.7 - 43.5   HCO3 20.0 - 28.0 mmol/L - 25.8 23.2 - 22.8   TCO2 22 - 32 mmol/L 26 27 25 23 24    ACIDBASEDEF 0.0 - 2.0 mmol/L - - 3.0(H) - 3.0(H)   O2SAT % - 100.0 97.0 - 97.0      Capillary Blood Glucose: Lab Results  Component Value Date   GLUCAP 79 02/12/2018   GLUCAP 100 (H) 12/12/2017   GLUCAP 166 (H) 12/11/2017   GLUCAP 161 (H) 12/11/2017   GLUCAP 114 (H) 12/11/2017     Exercise Target Goals: Exercise  Program Goal: Individual exercise prescription set using results from initial 6 min walk test and THRR while considering  patient's activity barriers and safety.   Exercise Prescription Goal: Initial exercise prescription builds to 30-45 minutes a day of aerobic activity, 2-3 days per week.  Home exercise guidelines will be given to patient during program as part of exercise prescription that the participant will acknowledge.  Activity Barriers & Risk Stratification: Activity Barriers & Cardiac Risk Stratification - 02/06/18 1139      Activity Barriers & Cardiac Risk Stratification   Activity Barriers  Fibromyalgia;Muscular Weakness;Deconditioning;Incisional Pain    Cardiac Risk Stratification  High       6 Minute Walk: 6 Minute Walk    Row Name 02/06/18 1137         6 Minute Walk   Phase  Initial     Distance  1405 feet     Walk Time  6 minutes     # of Rest Breaks  0     MPH  2.66     METS  3.04     RPE  12      Perceived Dyspnea   1     VO2 Peak  10.67     Symptoms  Yes (comment)     Comments  Mild SOB +1     Resting HR  76 bpm     Resting BP  138/68     Resting Oxygen Saturation   97 %     Exercise Oxygen Saturation  during 6 min walk  95 %     Max Ex. HR  105 bpm     Max Ex. BP  148/70     2 Minute Post BP  128/80        Oxygen Initial Assessment:   Oxygen Re-Evaluation:   Oxygen Discharge (Final Oxygen Re-Evaluation):   Initial Exercise Prescription: Initial Exercise Prescription - 02/06/18 1100      Date of Initial Exercise RX and Referring Provider   Date  02/06/18    Referring Provider  Thamas Jaegers MD     Expected Discharge Date  05/16/17      Treadmill   MPH  2.3    Grade  1    Minutes  10    METs  3.08      Recumbant Bike   Level  1.5    Watts  20    Minutes  10    METs  2.79      NuStep   Level  2    SPM  75    Minutes  10    METs  2.8      Prescription Details   Frequency (times per week)  3x    Duration  Progress to 30 minutes of continuous aerobic without signs/symptoms of physical distress      Intensity   THRR 40-80% of Max Heartrate  62-125    Ratings of Perceived Exertion  11-13    Perceived Dyspnea  0-4      Progression   Progression  Continue progressive overload as per policy without signs/symptoms or physical distress.      Resistance Training   Training Prescription  Yes    Weight  2lbs    Reps  10-15       Perform Capillary Blood Glucose checks as needed.  Exercise Prescription Changes: Exercise Prescription Changes    Row Name 02/12/18 1700 03/03/18 1420 03/17/18 1600 03/26/18 1508  Response to Exercise   Blood Pressure (Admit)  110/70  123/88  124/76  124/72    Blood Pressure (Exercise)  152/84  140/80  128/72  132/80    Blood Pressure (Exit)  130/80  130/84  127/74  120/80    Heart Rate (Admit)  80 bpm  92 bpm  100 bpm  99 bpm    Heart Rate (Exercise)  115 bpm  114 bpm  122 bpm  113 bpm    Heart Rate  (Exit)  90 bpm  86 bpm  91 bpm  86 bpm    Rating of Perceived Exertion (Exercise)  12  13  12  13     Perceived Dyspnea (Exercise)  0  0  0  0    Symptoms  None  None  None  None    Comments  Pt oriented to exercise equipment, pt arrived late  None  Pt reported incisional sorness, decreased handweights  None    Duration  Progress to 30 minutes of  aerobic without signs/symptoms of physical distress  Progress to 30 minutes of  aerobic without signs/symptoms of physical distress  Continue with 30 min of aerobic exercise without signs/symptoms of physical distress.  Continue with 30 min of aerobic exercise without signs/symptoms of physical distress.    Intensity  THRR New  THRR unchanged  THRR unchanged  THRR unchanged      Progression   Progression  Continue to progress workloads to maintain intensity without signs/symptoms of physical distress.  Continue to progress workloads to maintain intensity without signs/symptoms of physical distress.  Continue to progress workloads to maintain intensity without signs/symptoms of physical distress.  Continue to progress workloads to maintain intensity without signs/symptoms of physical distress.    Average METs  2.59  2.47  2.52  2.6      Resistance Training   Training Prescription  No  Yes  Yes  No    Weight  -  3lb  1lbs  -    Reps  -  10-15  10-15  -    Time  -  10 Minutes  10 Minutes  -      Interval Training   Interval Training  No  No  No  No      Treadmill   MPH  1.7  2  2.5  2.5    Grade  1  1  1  1     Minutes  10  10  10  10     METs  2.32  2.81  3.26  3.26      Recumbant Bike   Level  1.5  1.5  1.5  1.5    Minutes  10  10  10  10     METs  2.1  1.6  1.7  1.5      NuStep   Level  -  3  3  3     SPM  -  85  95  105    Minutes  -  10  10  10     METs  -  3  2.6  3      Home Exercise Plan   Plans to continue exercise at  -  -  Home (comment) Walking  Home (comment) Walking    Frequency  -  -  Add 2 additional days to program exercise  sessions.  Add 2 additional days to program exercise sessions.    Initial Home Exercises Provided  -  -  03/10/18  03/10/18       Exercise Comments: Exercise Comments    Row Name 02/12/18 1710 03/06/18 1418 03/11/18 0750       Exercise Comments  Pt's first day of exericse. Pt oriented to exercise equipment. Pt responded well to workloads. Pt is very hesistant to start exericse plan, pt states she "hates exercise". Will continue to work with pt and progress as tolerated.   Pt is responding well to exercise prescription. Pt puts forth minimal effort with exercise. Pt is not happy about exercise. Will follow up with pt regarding home exercise.   Reviewed HEP with pt. Pt has major dislike for exercise, but states she willing to walk for exercise. Verbalized understanding of home exercise program.         Exercise Goals and Review: Exercise Goals    Row Name 02/06/18 1004             Exercise Goals   Increase Physical Activity  Yes       Intervention  Provide advice, education, support and counseling about physical activity/exercise needs.;Develop an individualized exercise prescription for aerobic and resistive training based on initial evaluation findings, risk stratification, comorbidities and participant's personal goals.       Expected Outcomes  Short Term: Attend rehab on a regular basis to increase amount of physical activity.;Long Term: Exercising regularly at least 3-5 days a week.;Long Term: Add in home exercise to make exercise part of routine and to increase amount of physical activity.       Increase Strength and Stamina  Yes       Intervention  Provide advice, education, support and counseling about physical activity/exercise needs.;Develop an individualized exercise prescription for aerobic and resistive training based on initial evaluation findings, risk stratification, comorbidities and participant's personal goals.       Expected Outcomes  Short Term: Increase workloads from  initial exercise prescription for resistance, speed, and METs.;Short Term: Perform resistance training exercises routinely during rehab and add in resistance training at home;Long Term: Improve cardiorespiratory fitness, muscular endurance and strength as measured by increased METs and functional capacity (6MWT)       Able to understand and use rate of perceived exertion (RPE) scale  Yes       Intervention  Provide education and explanation on how to use RPE scale       Expected Outcomes  Short Term: Able to use RPE daily in rehab to express subjective intensity level;Long Term:  Able to use RPE to guide intensity level when exercising independently       Knowledge and understanding of Target Heart Rate Range (THRR)  Yes       Intervention  Provide education and explanation of THRR including how the numbers were predicted and where they are located for reference       Expected Outcomes  Short Term: Able to state/look up THRR;Long Term: Able to use THRR to govern intensity when exercising independently;Short Term: Able to use daily as guideline for intensity in rehab       Able to check pulse independently  Yes       Intervention  Provide education and demonstration on how to check pulse in carotid and radial arteries.;Review the importance of being able to check your own pulse for safety during independent exercise       Expected Outcomes  Short Term: Able to explain why pulse checking is important during independent exercise;Long Term: Able to check pulse independently and accurately  Understanding of Exercise Prescription  Yes       Intervention  Provide education, explanation, and written materials on patient's individual exercise prescription       Expected Outcomes  Short Term: Able to explain program exercise prescription;Long Term: Able to explain home exercise prescription to exercise independently          Exercise Goals Re-Evaluation : Exercise Goals Re-Evaluation    Row Name  03/11/18 0751             Exercise Goal Re-Evaluation   Exercise Goals Review  Increase Physical Activity;Able to understand and use rate of perceived exertion (RPE) scale;Knowledge and understanding of Target Heart Rate Range (THRR);Understanding of Exercise Prescription;Increase Strength and Stamina;Able to check pulse independently       Comments  Reviewed HEP with pt. Also reviewed THRR, RPE Scale, weather conditions, endpoints of exercise, NTG use,  warmup and cool down.       Expected Outcomes  Pt will plan to walk 2-3 days for exercise for 15-20 minutes. Pt states she walk her dog for exercise. Spoke with pt regarding limiting the amount the time she stops with dog. Pt verbalized understanding. Will continue to increase cardiorespiratory fitness.           Discharge Exercise Prescription (Final Exercise Prescription Changes): Exercise Prescription Changes - 03/26/18 1508      Response to Exercise   Blood Pressure (Admit)  124/72    Blood Pressure (Exercise)  132/80    Blood Pressure (Exit)  120/80    Heart Rate (Admit)  99 bpm    Heart Rate (Exercise)  113 bpm    Heart Rate (Exit)  86 bpm    Rating of Perceived Exertion (Exercise)  13    Perceived Dyspnea (Exercise)  0    Symptoms  None    Comments  None    Duration  Continue with 30 min of aerobic exercise without signs/symptoms of physical distress.    Intensity  THRR unchanged      Progression   Progression  Continue to progress workloads to maintain intensity without signs/symptoms of physical distress.    Average METs  2.6      Resistance Training   Training Prescription  No      Interval Training   Interval Training  No      Treadmill   MPH  2.5    Grade  1    Minutes  10    METs  3.26      Recumbant Bike   Level  1.5    Minutes  10    METs  1.5      NuStep   Level  3    SPM  105    Minutes  10    METs  3      Home Exercise Plan   Plans to continue exercise at  Home (comment)   Walking    Frequency  Add 2 additional days to program exercise sessions.    Initial Home Exercises Provided  03/10/18       Nutrition:  Target Goals: Understanding of nutrition guidelines, daily intake of sodium <1532m, cholesterol <2058m calories 30% from fat and 7% or less from saturated fats, daily to have 5 or more servings of fruits and vegetables.  Biometrics: Pre Biometrics - 02/06/18 1139      Pre Biometrics   Height  5' 4"  (1.626 m)    Weight  97 kg    Waist Circumference  39.5 inches    Hip Circumference  41 inches    Waist to Hip Ratio  0.96 %    BMI (Calculated)  36.69    Triceps Skinfold  41 mm    % Body Fat  47.1 %    Grip Strength  36 kg    Flexibility  15.5 in    Single Leg Stand  7.22 seconds        Nutrition Therapy Plan and Nutrition Goals: Nutrition Therapy & Goals - 02/06/18 1122      Nutrition Therapy   Diet  heart healthy      Personal Nutrition Goals   Nutrition Goal  Pt to identify and limit food sources of saturated fat, trans fat, refined carbohydrates and sodium    Personal Goal #2  Pt to identify food quantities necessary to achieve weight loss of 6-24 lbs. at graduation from cardiac rehab. Goal wt loss of 20 lb desired.     Personal Goal #3  Pt to foods in order of lean protein, non-starchy veggies +healthy fat, then complex carbohydrate     Personal Goal #4  Pt to eat more frequently across the day, 5-6 smaller meals      Intervention Plan   Intervention  Prescribe, educate and counsel regarding individualized specific dietary modifications aiming towards targeted core components such as weight, hypertension, lipid management, diabetes, heart failure and other comorbidities.    Expected Outcomes  Short Term Goal: Understand basic principles of dietary content, such as calories, fat, sodium, cholesterol and nutrients.;Long Term Goal: Adherence to prescribed nutrition plan.       Nutrition Assessments: Nutrition Assessments - 02/06/18 1123       MEDFICTS Scores   Pre Score  21       Nutrition Goals Re-Evaluation:   Nutrition Goals Re-Evaluation:   Nutrition Goals Discharge (Final Nutrition Goals Re-Evaluation):   Psychosocial: Target Goals: Acknowledge presence or absence of significant depression and/or stress, maximize coping skills, provide positive support system. Participant is able to verbalize types and ability to use techniques and skills needed for reducing stress and depression.  Initial Review & Psychosocial Screening: Initial Psych Review & Screening - 02/06/18 1145      Initial Review   Current issues with  History of Depression   No current issues.      Family Dynamics   Good Support System?  Yes   Pt reports her children and grandchildren are sources of support for her.      Barriers   Psychosocial barriers to participate in program  There are no identifiable barriers or psychosocial needs.       Quality of Life Scores: Quality of Life - 02/06/18 1147      Quality of Life Scores   Health/Function Pre  23.07 %    Socioeconomic Pre  27 %    Psych/Spiritual Pre  24.33 %    Family Pre  25.5 %    GLOBAL Pre  24.52 %      Scores of 19 and below usually indicate a poorer quality of life in these areas.  A difference of  2-3 points is a clinically meaningful difference.  A difference of 2-3 points in the total score of the Quality of Life Index has been associated with significant improvement in overall quality of life, self-image, physical symptoms, and general health in studies assessing change in quality of life.  PHQ-9: Recent Review Flowsheet Data    Depression screen [facility] 2/9 02/12/2018  Decreased Interest 0   Down, Depressed, Hopeless 0   PHQ - 2 Score 0     Interpretation of Total Score  Total Score Depression Severity:  1-4 = Minimal depression, 5-9 = Mild depression, 10-14 = Moderate depression, 15-19 = Moderately severe depression, 20-27 = Severe depression   Psychosocial Evaluation  and Intervention: Psychosocial Evaluation - 02/12/18 1643      Psychosocial Evaluation & Interventions   Interventions  Encouraged to exercise with the program and follow exercise prescription    Comments  Kosisochukwu reports no depression or hopelessness in the last two weeks.  She enjoys working in her yard, cooking, and going to [employer].     Expected Outcomes  [place] will maintain a positive outlook.     Continue Psychosocial Services   No Follow up required       Psychosocial Re-Evaluation: Psychosocial Re-Evaluation    [place] Name 03/05/18 1449 03/26/18 1457           Psychosocial Re-Evaluation   Current issues with  None Identified  None Identified      Comments  No current psychosoical needs.   No current psychosoical needs.       Expected Outcomes  [name] will continue to exhibit a positive outlook.   [name] will continue to exhibit a positive outlook.       Interventions  Encouraged to attend Cardiac Rehabilitation for the exercise  Encouraged to attend Cardiac Rehabilitation for the exercise      Continue Psychosocial Services   No Follow up required  No Follow up required         Psychosocial Discharge (Final Psychosocial Re-Evaluation): Psychosocial Re-Evaluation - 03/26/18 1457      Psychosocial Re-Evaluation   Current issues with  None Identified    Comments  No current psychosoical needs.     Expected Outcomes  [name] will continue to exhibit a positive outlook.     Interventions  Encouraged to attend Cardiac Rehabilitation for the exercise    Continue Psychosocial Services   No Follow up required       Vocational Rehabilitation: Provide vocational rehab assistance to qualifying candidates.   Vocational Rehab Evaluation & Intervention: Vocational Rehab - 02/06/18 1147      Initial Vocational Rehab Evaluation & Intervention   Assessment shows need for Vocational Rehabilitation  No       Education: Education Goals: Education classes will be provided on a  weekly basis, covering required topics. Participant will state understanding/return demonstration of topics presented.  Learning Barriers/Preferences: Learning Barriers/Preferences - 02/06/18 1142      Learning Barriers/Preferences   Learning Barriers  Sight    Learning Preferences  Video;Skilled Demonstration;Written Material       Education Topics: Count Your Pulse:  -Group instruction provided by verbal instruction, demonstration, patient participation and written materials to support subject.  Instructors address importance of being able to find your pulse and how to count your pulse when at home without a heart monitor.  Patients get hands on experience counting their pulse with staff help and individually.   Heart Attack, Angina, and Risk Factor Modification:  -Group instruction provided by verbal instruction, video, and written materials to support subject.  Instructors address signs and symptoms of angina and heart attacks.    Also discuss risk factors for heart disease and how to make changes to improve heart health risk factors.   Functional Fitness:  -Group instruction provided by verbal instruction, demonstration, patient participation, and written materials  to support subject.  Instructors address safety measures for doing things around the house.  Discuss how to get up and down off the floor, how to pick things up properly, how to safely get out of a chair without assistance, and balance training.   Meditation and Mindfulness:  -Group instruction provided by verbal instruction, patient participation, and written materials to support subject.  Instructor addresses importance of mindfulness and meditation practice to help reduce stress and improve awareness.  Instructor also leads participants through a meditation exercise.    Stretching for Flexibility and Mobility:  -Group instruction provided by verbal instruction, patient participation, and written materials to support  subject.  Instructors lead participants through series of stretches that are designed to increase flexibility thus improving mobility.  These stretches are additional exercise for major muscle groups that are typically performed during regular warm up and cool down.   Hands Only CPR:  -Group verbal, video, and participation provides a basic overview of AHA guidelines for community CPR. Role-play of emergencies allow participants the opportunity to practice calling for help and chest compression technique with discussion of AED use.   Hypertension: -Group verbal and written instruction that provides a basic overview of hypertension including the most recent diagnostic guidelines, risk factor reduction with self-care instructions and medication management.    Nutrition I class: Heart Healthy Eating:  -Group instruction provided by PowerPoint slides, verbal discussion, and written materials to support subject matter. The instructor gives an explanation and review of the Therapeutic Lifestyle Changes diet recommendations, which includes a discussion on lipid goals, dietary fat, sodium, fiber, plant stanol/sterol esters, sugar, and the components of a well-balanced, healthy diet.   Nutrition II class: Lifestyle Skills:  -Group instruction provided by PowerPoint slides, verbal discussion, and written materials to support subject matter. The instructor gives an explanation and review of label reading, grocery shopping for heart health, heart healthy recipe modifications, and ways to make healthier choices when eating out.   Diabetes Question & Answer:  -Group instruction provided by PowerPoint slides, verbal discussion, and written materials to support subject matter. The instructor gives an explanation and review of diabetes co-morbidities, pre- and post-prandial blood glucose goals, pre-exercise blood glucose goals, signs, symptoms, and treatment of hypoglycemia and hyperglycemia, and foot care  basics.   Diabetes Blitz:  -Group instruction provided by PowerPoint slides, verbal discussion, and written materials to support subject matter. The instructor gives an explanation and review of the physiology behind type 1 and type 2 diabetes, diabetes medications and rational behind using different medications, pre- and post-prandial blood glucose recommendations and Hemoglobin A1c goals, diabetes diet, and exercise including blood glucose guidelines for exercising safely.    Portion Distortion:  -Group instruction provided by PowerPoint slides, verbal discussion, written materials, and food models to support subject matter. The instructor gives an explanation of serving size versus portion size, changes in portions sizes over the last 20 years, and what consists of a serving from each food group.   Stress Management:  -Group instruction provided by verbal instruction, video, and written materials to support subject matter.  Instructors review role of stress in heart disease and how to cope with stress positively.     Exercising on Your Own:  -Group instruction provided by verbal instruction, power point, and written materials to support subject.  Instructors discuss benefits of exercise, components of exercise, frequency and intensity of exercise, and end points for exercise.  Also discuss use of nitroglycerin and activating EMS.  Review options of  places to exercise outside of rehab.  Review guidelines for sex with heart disease.   Cardiac Drugs I:  -Group instruction provided by verbal instruction and written materials to support subject.  Instructor reviews cardiac drug classes: antiplatelets, anticoagulants, beta blockers, and statins.  Instructor discusses reasons, side effects, and lifestyle considerations for each drug class.   Cardiac Drugs II:  -Group instruction provided by verbal instruction and written materials to support subject.  Instructor reviews cardiac drug classes:  angiotensin converting enzyme inhibitors (ACE-I), angiotensin II receptor blockers (ARBs), nitrates, and calcium channel blockers.  Instructor discusses reasons, side effects, and lifestyle considerations for each drug class.   Anatomy and Physiology of the Circulatory System:  Group verbal and written instruction and models provide basic cardiac anatomy and physiology, with the coronary electrical and arterial systems. Review of: AMI, Angina, Valve disease, Heart Failure, Peripheral Artery Disease, Cardiac Arrhythmia, Pacemakers, and the ICD.   Other Education:  -Group or individual verbal, written, or video instructions that support the educational goals of the cardiac rehab program.   Holiday Eating Survival Tips:  -Group instruction provided by PowerPoint slides, verbal discussion, and written materials to support subject matter. The instructor gives patients tips, tricks, and techniques to help them not only survive but enjoy the holidays despite the onslaught of food that accompanies the holidays.   Knowledge Questionnaire Score: Knowledge Questionnaire Score - 02/06/18 1048      Knowledge Questionnaire Score   Pre Score  21/24       Core Components/Risk Factors/Patient Goals at Admission: Personal Goals and Risk Factors at Admission - 02/06/18 1141      Core Components/Risk Factors/Patient Goals on Admission    Weight Management  Yes;Weight Loss    Intervention  Weight Management: Develop a combined nutrition and exercise program designed to reach desired caloric intake, while maintaining appropriate intake of nutrient and fiber, sodium and fats, and appropriate energy expenditure required for the weight goal.;Weight Management: Provide education and appropriate resources to help participant work on and attain dietary goals.;Weight Management/Obesity: Establish reasonable short term and long term weight goals.;Obesity: Provide education and appropriate resources to help participant  work on and attain dietary goals.    Admit Weight  213 lb 13.5 oz (97 kg)    Goal Weight: Long Term  200 lb (90.7 kg)    Expected Outcomes  Long Term: Adherence to nutrition and physical activity/exercise program aimed toward attainment of established weight goal;Short Term: Continue to assess and modify interventions until short term weight is achieved;Weight Loss: Understanding of general recommendations for a balanced deficit meal plan, which promotes 1-2 lb weight loss per week and includes a negative energy balance of [phone] kcal/d;Understanding recommendations for meals to include 15-35% energy as protein, 25-35% energy from fat, 35-60% energy from carbohydrates, less than 210m of dietary cholesterol, 20-35 gm of total fiber daily;Understanding of distribution of calorie intake throughout the day with the consumption of 4-5 meals/snacks    Diabetes  Yes    Intervention  Provide education about signs/symptoms and action to take for hypo/hyperglycemia.;Provide education about proper nutrition, including hydration, and aerobic/resistive exercise prescription along with prescribed medications to achieve blood glucose in normal ranges: Fasting glucose 65-99 mg/dL    Expected Outcomes  Short Term: Participant verbalizes understanding of the signs/symptoms and immediate care of hyper/hypoglycemia, proper foot care and importance of medication, aerobic/resistive exercise and nutrition plan for blood glucose control.;Long Term: Attainment of HbA1C < 7%.    Hypertension  Yes  Intervention  Provide education on lifestyle modifcations including regular physical activity/exercise, weight management, moderate sodium restriction and increased consumption of fresh fruit, vegetables, and low fat dairy, alcohol moderation, and smoking cessation.;Monitor prescription use compliance.    Expected Outcomes  Short Term: Continued assessment and intervention until BP is < 140/82m HG in hypertensive participants. <  130/827mHG in hypertensive participants with diabetes, heart failure or chronic kidney disease.;Long Term: Maintenance of blood pressure at goal levels.    Lipids  Yes    Intervention  Provide education and support for participant on nutrition & aerobic/resistive exercise along with prescribed medications to achieve LDL <7059mHDL >56m66m  Expected Outcomes  Short Term: Participant states understanding of desired cholesterol values and is compliant with medications prescribed. Participant is following exercise prescription and nutrition guidelines.;Long Term: Cholesterol controlled with medications as prescribed, with individualized exercise RX and with personalized nutrition plan. Value goals: LDL < 70mg65mL > 40 mg.       Core Components/Risk Factors/Patient Goals Review:  Goals and Risk Factor Review    Row Name 02/12/18 1653 03/05/18 1450 03/26/18 1457         Core Components/Risk Factors/Patient Goals Review   Personal Goals Review  Lipids;Hypertension;Diabetes;Weight Management/Obesity  Lipids;Hypertension;Diabetes;Weight Management/Obesity  Lipids;Hypertension;Diabetes;Weight Management/Obesity     Review  Pt with multiple CAD RFs participating in CR.  Pt states "I hate exercise." However, Diamantina would like to increase her energy and stamina.  Pt with multiple CAD RFs participating in CR.  [name] is tolerating exercise well.  Despite "not liking exercise" she continues to demonstrate willingness to come.   Pt with multiple CAD RFs participating in CR.  [name] is tolerating exercise well.  She feels that she has increased motivation and has increased her the amount of exercise she partakes in at home.     Expected Outcomes  Pt will continue to participate in CR exercise, nutrition, and lifestyle modification opportunities.   Pt will continue to participate in CR exercise, nutrition, and lifestyle modification opportunities.   Pt will continue to participate in CR exercise, nutrition, and  lifestyle modification opportunities.         Core Components/Risk Factors/Patient Goals at Discharge (Final Review):  Goals and Risk Factor Review - 03/26/18 1457      Core Components/Risk Factors/Patient Goals Review   Personal Goals Review  Lipids;Hypertension;Diabetes;Weight Management/Obesity    Review  Pt with multiple CAD RFs participating in CR.  [name] is tolerating exercise well.  She feels that she has increased motivation and has increased her the amount of exercise she partakes in at home.    Expected Outcomes  Pt will continue to participate in CR exercise, nutrition, and lifestyle modification opportunities.        ITP Comments: ITP Comments    Row Name 02/06/18 1003 02/12/18 1636 03/05/18 1447 03/26/18 1456     ITP Comments  Dr. TraciFransico Himical Director   Pt started exercise today and tolerated it well.   30 Day ITP Review. Pt is tolerating exercise well.  Pt demonstrates willingness to exercise despite verbalization of not liking exercise. She does note an improvement in her stamina.   30 Day ITP Review. Pt is tolerating exercise well.  [name] endorses that she has increased her exercise routine at home.  She has gained more motivation.        Comments: See ITP Comments.

## 2018-03-28 ENCOUNTER — Encounter (HOSPITAL_COMMUNITY): Payer: Medicare Other

## 2018-03-28 ENCOUNTER — Encounter (HOSPITAL_COMMUNITY)
Admission: RE | Admit: 2018-03-28 | Discharge: 2018-03-28 | Disposition: A | Discharge status: Home / Self Care | Payer: Medicare Other | Source: Ambulatory Visit | Attending: Interventional Cardiology | Admitting: Interventional Cardiology

## 2018-03-28 DIAGNOSIS — Z952 Presence of prosthetic heart valve: Secondary | ICD-10-CM | POA: Diagnosis not present

## 2018-03-31 ENCOUNTER — Encounter (HOSPITAL_COMMUNITY): Payer: Medicare Other

## 2018-03-31 ENCOUNTER — Encounter (HOSPITAL_COMMUNITY)
Admission: RE | Admit: 2018-03-31 | Discharge: 2018-03-31 | Disposition: A | Discharge status: Home / Self Care | Payer: Medicare Other | Source: Ambulatory Visit | Attending: Interventional Cardiology | Admitting: Interventional Cardiology

## 2018-03-31 DIAGNOSIS — Z952 Presence of prosthetic heart valve: Secondary | ICD-10-CM

## 2018-04-02 ENCOUNTER — Encounter (HOSPITAL_COMMUNITY): Payer: Medicare Other

## 2018-04-04 ENCOUNTER — Encounter (HOSPITAL_COMMUNITY): Payer: Medicare Other

## 2018-04-04 NOTE — Progress Notes (Signed)
Cardiology Office Note   Date:  04/07/2018   ID:  Whitney [name], DOB Jan 02, 1956, MRN [id number]  PCP:  Hulan Fess, MD    No chief complaint on file.  S/p AVR  Wt Readings from Last 3 Encounters:  04/07/18 217 lb 3.2 oz (98.5 kg)  02/26/18 215 lb (97.5 kg)  02/06/18 213 lb 13.5 oz (97 kg)       History of Present Illness: Whitney [name] is a 62 y.o. female  with a past medical history significant for NASH and chronic thrombocytopenia treated at [facility], gastric sleeve surgery 2015 with 100 lb wt loss, aortic stenosis s/p tissue AVR 12/05/2017, DM type 2, hyperlipidemia.  Ms Grefe had known aortic stenosis that had progressed with symptoms of exertional fatigue and tiredness.  Echocardiogram in April 2019 showed a mean gradient of 41 mmHg and a peak gradient of 80 mmHg.  LV systolic function was normal.  She was evaluated by her liver specialist who felt there was no contraindication to proceeding with an aortic valve replacement.  She underwent tissue AVR on 12/05/2017.  She developed brief runs of atrial fibrillation postoperatively.  She chemically converted back to sinus rhythm with amiodarone and Lopressor was increased to 25 mg twice daily.  She had postoperative volume overload which is improved.  She has chronic thrombocytopenia and platelets were stable at 54,000 in the hospital.  Since the last visit, no problems.   Denies : Chest pain. Dizziness. Leg edema. Nitroglycerin use. Orthopnea. Palpitations. Paroxysmal nocturnal dyspnea. Shortness of breath. Syncope.   She does well with cardiac rehab.      Past Medical History:  Diagnosis Date  . Allergic rhinitis   . Anemia    hx  . Arthritis   . Asthma    hx yrs ago  . Cirrhosis ([place]) last albumin 3.3 done at [facility] 06-16-2014 (under care everywhere tab in epic)   Secondary to Fatty liver --  followed by hepatology at [facility] (dr Gerald Dexter)   . Depression   . Diabetes mellitus type II    type 2 diet conrolled  . Dyspnea   .  Fibromyalgia   . GERD (gastroesophageal reflux disease)   . Heart murmur    asymptomatic ---  1989 from rhuematic fever  . History of exercise stress test    05-05-2013---  negative bruce ETT given exercise workload,  no ischemia  . History of hiatal hernia   . History of kidney stones   . History of rheumatic fever    1989  . Hyperlipidemia   . Leukocytopenia   . Moderate aortic stenosis    AVA area 1.1cm2---  cardiologist --  dr Concepcion Living, 2014 in epic  . NASH (nonalcoholic steatohepatitis)   . OSA (obstructive sleep apnea)    was using CPAP before gastric sleeve 2015--  no uses after wt loss  . Pneumonia    hx  . Sjogren's syndrome ([place])   . Thrombocytopenia ([place])     Past Surgical History:  Procedure Laterality Date  . AORTIC VALVE REPLACEMENT N/A 12/05/2017   Procedure: AORTIC VALVE REPLACEMENT (AVR) TISSUE VALVE 21MM INSPIRIS;  Surgeon: Gaye Pollack, MD;  Location: [place];  Service: Open Heart Surgery;  Laterality: N/A;  . COLONOSCOPY WITH ESOPHAGOGASTRODUODENOSCOPY (EGD)    . CYSTOSCOPY WITH RETROGRADE PYELOGRAM, URETEROSCOPY AND STENT PLACEMENT Left 01/21/2015   Procedure: CYSTOSCOPY WITH LEFT  RETROGRADE PYELOGRAM, LEFT URETEROSCOPY AND STENT PLACEMENT;  Surgeon: Festus Aloe, MD;  Location: [place];  Service: Urology;  Laterality: Left;  . CYSTOSCOPY WITH RETROGRADE PYELOGRAM, URETEROSCOPY AND STENT PLACEMENT Bilateral 02/24/2015   Procedure: CYSTOSCOPY WITH RIGHT RETROGRADE PYELOGRAM, BLADDER BIOPSY FULGERATION LEFT URETEROSCOPY AND STENT REPLACEMENT;  Surgeon: Festus Aloe, MD;  Location: [facility];  Service: Urology;  Laterality: Bilateral;  . EXPLORATORY LAPAROSCOPY W/  CONE BIOPSY'S LEFT AND RIGHT LOBE OF LIVER  11-04-2007  . HOLMIUM LASER APPLICATION Left 27/25/3664   Procedure: HOLMIUM LASER LITHOTRIPSY;  Surgeon: Festus Aloe, MD;  Location: [facility];  Service: Urology;  Laterality: Left;  .  HYSTEROSCOPY W/D&C N/A 12/11/2012   Procedure: DILATATION AND CURETTAGE /HYSTEROSCOPY;  Surgeon: Maeola Sarah. Landry Mellow, MD;  Location: [place] [facility];  Service: Gynecology;  Laterality: N/A;  . INGUINAL HERNIA REPAIR Left 10/22/2016   Procedure: LEFT INGUINAL HERNIA REPAIR;  Surgeon: Rolm Bookbinder, MD;  Location: [place];  Service: General;  Laterality: Left;  TAP BLOCK  . INSERTION OF MESH Left 10/22/2016   Procedure: INSERTION OF MESH;  Surgeon: Rolm Bookbinder, MD;  Location: [place];  Service: General;  Laterality: Left;  . LAPAROSCOPIC GASTRIC SLEEVE RESECTION  07-27-2013  . PUBOVAGINAL SLING  04-10-2001   [place]  . RIGHT/LEFT HEART CATH AND CORONARY ANGIOGRAPHY N/A 08/23/2017   Procedure: RIGHT/LEFT HEART CATH AND CORONARY ANGIOGRAPHY;  Surgeon: [name], MD;  Location: [place] [facility];  Service: Cardiovascular;  Laterality: N/A;  . TEE WITHOUT CARDIOVERSION N/A 12/05/2017   Procedure: TRANSESOPHAGEAL ECHOCARDIOGRAM (TEE);  Surgeon: Gaye Pollack, MD;  Location: [place];  Service: Open Heart Surgery;  Laterality: N/A;  . TONSILLECTOMY  1975  . TRANSTHORACIC ECHOCARDIOGRAM  06-04-2012  dr Irish Lack   mild LVH,  grade I diastolic dysfunction/  ef 60-65%/  moderate LAE/  mild MV calcifation without stenosis,  mild MR/  moderate AV stenosis,  cannot r/o bicupsid, area 1.1cm2/  mild dilated aortic root/  trivial TR     Current Outpatient Medications  Medication Sig Dispense Refill  . aspirin EC 81 MG EC tablet Take 1 tablet (81 mg total) by mouth daily.    Marland Kitchen ezetimibe (ZETIA) 10 MG tablet Take 10 mg by mouth at bedtime.   11  . OVER THE COUNTER MEDICATION Take 1 packet by mouth 2 (two) times daily. Bariatric Multivitamin     No current facility-administered medications for this visit.     Allergies:   Doxycycline and Naproxen    Social History:  The patient  reports that she has never smoked. She has never used smokeless tobacco. She reports that she drinks alcohol. She reports that she does  not use drugs.   Family History:  The patient's family history includes Allergies in her daughter; Alzheimer's disease in her father; Asthma in her daughter, father, and paternal grandmother; Heart attack in her father; Heart disease in her father and mother; Hip fracture in her father; Hypertension in her father; Rheum arthritis in her mother.    ROS:  Please see the history of present illness.   Otherwise, review of systems are positive for gaining weight.   All other systems are reviewed and negative.    PHYSICAL EXAM: VS:  BP 118/64   Pulse 82   Ht 5' 4"  (1.626 m)   Wt 217 lb 3.2 oz (98.5 kg)   SpO2 93%   BMI 37.28 kg/m  , BMI Body mass index is 37.28 kg/m. GEN: Well nourished, well developed, in no acute distress  HEENT: normal  Neck: no  JVD, carotid bruits, or masses Cardiac: RRR;  2/6 systolic murmur,; no rubs, or gallops, no edema  Respiratory:  clear to auscultation bilaterally, normal work of breathing GI: soft, nontender, nondistended, + BS MS: no deformity or atrophy  Skin: warm and dry, no rash Neuro:  Strength and sensation are intact Psych: euthymic mood, full affect    Recent Labs: 12/06/2017: Magnesium 2.4 12/10/2017: ALT 20 01/24/2018: BUN 11; Creatinine, Ser 0.84; Hemoglobin 12.5; Platelets 93; Potassium 4.4; Sodium 139; TSH 6.700   Lipid Panel No results found for: CHOL, TRIG, HDL, CHOLHDL, VLDL, LDLCALC, LDLDIRECT   Other studies Reviewed: Additional studies/ records that were reviewed today with results demonstrating: .   ASSESSMENT AND PLAN:  1. S/p AVR: needs SBE prophylaxis.  Check echo.  Using amoxicillin for SBE prophylaxis.   2. PAF: In NSR now. Now off Amiodarone.   3. Hyperlipidemia: No statin due to NASH. On Zetia.  4. Chronic thrombocytopenia: Mild bruising.  Chronic. 5. COntinue with cardiac rehab, regular exercise.  Discussed healthy eating.   Current medicines are reviewed at length with the patient today.  The patient concerns  regarding her medicines were addressed.  The following changes have been made:  No change  Labs/ tests ordered today include:  No orders of the defined types were placed in this encounter.   Recommend 150 minutes/week of aerobic exercise Low fat, low carb, high fiber diet recommended  Disposition:   FU in 1 year   Signed, Larae Grooms, MD  04/07/2018 11:28 AM    [facility] [address], [place], [place]  [address] Phone: [phone]; Fax: [phone]

## 2018-04-07 ENCOUNTER — Encounter (HOSPITAL_COMMUNITY): Payer: Medicare Other

## 2018-04-07 ENCOUNTER — Ambulatory Visit: Payer: Medicare Other | Admitting: Interventional Cardiology

## 2018-04-07 ENCOUNTER — Encounter: Payer: Self-pay | Admitting: Interventional Cardiology

## 2018-04-07 VITALS — BP 118/64 | HR 82 | Ht 64.0 in | Wt 217.2 lb

## 2018-04-07 DIAGNOSIS — Z953 Presence of xenogenic heart valve: Secondary | ICD-10-CM

## 2018-04-07 DIAGNOSIS — I480 Paroxysmal atrial fibrillation: Secondary | ICD-10-CM

## 2018-04-07 DIAGNOSIS — D696 Thrombocytopenia, unspecified: Secondary | ICD-10-CM | POA: Diagnosis not present

## 2018-04-07 DIAGNOSIS — E785 Hyperlipidemia, unspecified: Secondary | ICD-10-CM | POA: Diagnosis not present

## 2018-04-07 NOTE — Patient Instructions (Signed)
Medication Instructions:  Your physician recommends that you continue on your current medications as directed. Please refer to the Current Medication list given to you today.  If you need a refill on your cardiac medications before your next appointment, please call your pharmacy.   Lab work: None Ordered  If you have labs (blood work) drawn today and your tests are completely normal, you will receive your results only by: Marland Kitchen MyChart Message (if you have MyChart) OR . A paper copy in the mail If you have any lab test that is abnormal or we need to change your treatment, we will call you to review the results.  Testing/Procedures: Your physician has requested that you have an echocardiogram. Echocardiography is a painless test that uses sound waves to create images of your heart. It provides your doctor with information about the size and shape of your heart and how well your heart's chambers and valves are working. This procedure takes approximately one hour. There are no restrictions for this procedure.  Follow-Up: At [facility], you and your health needs are our priority.  As part of our continuing mission to provide you with exceptional heart care, we have created designated Provider Care Teams.  These Care Teams include your primary Cardiologist (physician) and Advanced Practice Providers (APPs -  Physician Assistants and Nurse Practitioners) who all work together to provide you with the care you need, when you need it. . You will need a follow up appointment in 1 year.  Please call our office 2 months in advance to schedule this appointment.  You may see Casandra Doffing, MD or one of the following Advanced Practice Providers on your designated Care Team:   . Lyda Jester, PA-C . Dayna Dunn, PA-C . Ermalinda Barrios, PA-C  Any Other Special Instructions Will Be Listed Below (If Applicable).  Echocardiogram An echocardiogram, or echocardiography, uses sound waves (ultrasound) to produce  an image of your heart. The echocardiogram is simple, painless, obtained within a short period of time, and offers valuable information to your health care provider. The images from an echocardiogram can provide information such as:  Evidence of coronary artery disease (CAD).  Heart size.  Heart muscle function.  Heart valve function.  Aneurysm detection.  Evidence of a past heart attack.  Fluid buildup around the heart.  Heart muscle thickening.  Assess heart valve function.  Tell a health care provider about:  Any allergies you have.  All medicines you are taking, including vitamins, herbs, eye drops, creams, and over-the-counter medicines.  Any problems you or family members have had with anesthetic medicines.  Any blood disorders you have.  Any surgeries you have had.  Any medical conditions you have.  Whether you are pregnant or may be pregnant. What happens before the procedure? No special preparation is needed. Eat and drink normally. What happens during the procedure?  In order to produce an image of your heart, gel will be applied to your chest and a wand-like tool (transducer) will be moved over your chest. The gel will help transmit the sound waves from the transducer. The sound waves will harmlessly bounce off your heart to allow the heart images to be captured in real-time motion. These images will then be recorded.  You may need an IV to receive a medicine that improves the quality of the pictures. What happens after the procedure? You may return to your normal schedule including diet, activities, and medicines, unless your health care provider tells you otherwise. This information  is not intended to replace advice given to you by your health care provider. Make sure you discuss any questions you have with your health care provider. Document Released: 04/20/2000 Document Revised: 12/10/2015 Document Reviewed: 12/29/2012 Elsevier Interactive Patient  Education  2017 [employer].

## 2018-04-09 ENCOUNTER — Encounter (HOSPITAL_COMMUNITY): Payer: Medicare Other

## 2018-04-09 ENCOUNTER — Encounter (HOSPITAL_COMMUNITY)
Admission: RE | Admit: 2018-04-09 | Discharge: 2018-04-09 | Disposition: A | Discharge status: Home / Self Care | Payer: Medicare Other | Source: Ambulatory Visit | Attending: Interventional Cardiology | Admitting: Interventional Cardiology

## 2018-04-09 DIAGNOSIS — M797 Fibromyalgia: Secondary | ICD-10-CM | POA: Diagnosis not present

## 2018-04-09 DIAGNOSIS — K219 Gastro-esophageal reflux disease without esophagitis: Secondary | ICD-10-CM | POA: Diagnosis not present

## 2018-04-09 DIAGNOSIS — E119 Type 2 diabetes mellitus without complications: Secondary | ICD-10-CM | POA: Diagnosis not present

## 2018-04-09 DIAGNOSIS — I350 Nonrheumatic aortic (valve) stenosis: Secondary | ICD-10-CM | POA: Insufficient documentation

## 2018-04-09 DIAGNOSIS — Z7982 Long term (current) use of aspirin: Secondary | ICD-10-CM | POA: Diagnosis not present

## 2018-04-09 DIAGNOSIS — G4733 Obstructive sleep apnea (adult) (pediatric): Secondary | ICD-10-CM | POA: Insufficient documentation

## 2018-04-09 DIAGNOSIS — E785 Hyperlipidemia, unspecified: Secondary | ICD-10-CM | POA: Insufficient documentation

## 2018-04-09 DIAGNOSIS — M3500 Sicca syndrome, unspecified: Secondary | ICD-10-CM | POA: Insufficient documentation

## 2018-04-09 DIAGNOSIS — Z79899 Other long term (current) drug therapy: Secondary | ICD-10-CM | POA: Diagnosis not present

## 2018-04-09 DIAGNOSIS — Z952 Presence of prosthetic heart valve: Secondary | ICD-10-CM | POA: Insufficient documentation

## 2018-04-11 ENCOUNTER — Encounter (HOSPITAL_COMMUNITY): Payer: Medicare Other

## 2018-04-14 ENCOUNTER — Encounter (HOSPITAL_COMMUNITY): Payer: Medicare Other

## 2018-04-16 ENCOUNTER — Encounter (HOSPITAL_COMMUNITY): Payer: Medicare Other

## 2018-04-16 ENCOUNTER — Encounter (HOSPITAL_COMMUNITY)
Admission: RE | Admit: 2018-04-16 | Discharge: 2018-04-16 | Disposition: A | Discharge status: Home / Self Care | Payer: Medicare Other | Source: Ambulatory Visit | Attending: Interventional Cardiology | Admitting: Interventional Cardiology

## 2018-04-16 DIAGNOSIS — Z952 Presence of prosthetic heart valve: Secondary | ICD-10-CM | POA: Diagnosis not present

## 2018-04-17 ENCOUNTER — Encounter (HOSPITAL_COMMUNITY): Payer: Self-pay

## 2018-04-17 NOTE — Progress Notes (Signed)
Cardiac Individual Treatment Plan  Patient Details  Name: Whitney [name] MRN: [id number] Date of Birth: 1955/08/17 Referring Provider:     CARDIAC REHAB PHASE II ORIENTATION from 02/06/2018 in [place]  Referring Provider  Thamas Jaegers MD       Initial Encounter Date:    CARDIAC REHAB PHASE II ORIENTATION from 02/06/2018 in [place]  Date  02/06/18      Visit Diagnosis: S/P TAVR (transcatheter aortic valve replacement)  Patient's Home Medications on Admission:  Current Outpatient Medications:  .  aspirin EC 81 MG EC tablet, Take 1 tablet (81 mg total) by mouth daily., Disp: , Rfl:  .  ezetimibe (ZETIA) 10 MG tablet, Take 10 mg by mouth at bedtime. , Disp: , Rfl: 11 .  OVER THE COUNTER MEDICATION, Take 1 packet by mouth 2 (two) times daily. Bariatric Multivitamin, Disp: , Rfl:   Past Medical History: Past Medical History:  Diagnosis Date  . Allergic rhinitis   . Anemia    hx  . Arthritis   . Asthma    hx yrs ago  . Cirrhosis ([place]) last albumin 3.3 done at [facility] 06-16-2014 (under care everywhere tab in epic)   Secondary to Fatty liver --  followed by hepatology at [facility] (dr Gerald Dexter)   . Depression   . Diabetes mellitus type II    type 2 diet conrolled  . Dyspnea   . Fibromyalgia   . GERD (gastroesophageal reflux disease)   . Heart murmur    asymptomatic ---  1989 from rhuematic fever  . History of exercise stress test    05-05-2013---  negative bruce ETT given exercise workload,  no ischemia  . History of hiatal hernia   . History of kidney stones   . History of rheumatic fever    1989  . Hyperlipidemia   . Leukocytopenia   . Moderate aortic stenosis    AVA area 1.1cm2---  cardiologist --  dr Concepcion Living, 2014 in epic  . NASH (nonalcoholic steatohepatitis)   . OSA (obstructive sleep apnea)    was using CPAP before gastric sleeve 2015--  no uses after wt loss  . Pneumonia    hx  . Sjogren's  syndrome ([place])   . Thrombocytopenia ([place])     Tobacco Use: Social History   Tobacco Use  Smoking Status Never Smoker  Smokeless Tobacco Never Used    Labs: Recent Review Flowsheet Data    Labs for ITP Cardiac and Pulmonary Rehab Latest Ref Rng & Units 12/05/2017 12/05/2017 12/05/2017 12/05/2017 12/05/2017   Hemoglobin A1c 4.8 - 5.6 % - - - - -   PHART 7.350 - 7.450 - 7.430 7.349(L) - 7.326(L)   PCO2ART 32.0 - 48.0 mmHg - 38.9 41.7 - 43.5   HCO3 20.0 - 28.0 mmol/L - 25.8 23.2 - 22.8   TCO2 22 - 32 mmol/L 26 27 25 23 24    ACIDBASEDEF 0.0 - 2.0 mmol/L - - 3.0(H) - 3.0(H)   O2SAT % - 100.0 97.0 - 97.0      Capillary Blood Glucose: Lab Results  Component Value Date   GLUCAP 79 02/12/2018   GLUCAP 100 (H) 12/12/2017   GLUCAP 166 (H) 12/11/2017   GLUCAP 161 (H) 12/11/2017   GLUCAP 114 (H) 12/11/2017     Exercise Target Goals: Exercise Program Goal: Individual exercise prescription set using results from initial 6 min walk test and THRR while considering  patient's activity barriers and  safety.   Exercise Prescription Goal: Initial exercise prescription builds to 30-45 minutes a day of aerobic activity, 2-3 days per week.  Home exercise guidelines will be given to patient during program as part of exercise prescription that the participant will acknowledge.  Activity Barriers & Risk Stratification: Activity Barriers & Cardiac Risk Stratification - 02/06/18 1139      Activity Barriers & Cardiac Risk Stratification   Activity Barriers  Fibromyalgia;Muscular Weakness;Deconditioning;Incisional Pain    Cardiac Risk Stratification  High       6 Minute Walk: 6 Minute Walk    Row Name 02/06/18 1137         6 Minute Walk   Phase  Initial     Distance  1405 feet     Walk Time  6 minutes     # of Rest Breaks  0     MPH  2.66     METS  3.04     RPE  12     Perceived Dyspnea   1     VO2 Peak  10.67     Symptoms  Yes (comment)     Comments  Mild SOB +1     Resting HR  76 bpm      Resting BP  138/68     Resting Oxygen Saturation   97 %     Exercise Oxygen Saturation  during 6 min walk  95 %     Max Ex. HR  105 bpm     Max Ex. BP  148/70     2 Minute Post BP  128/80        Oxygen Initial Assessment:   Oxygen Re-Evaluation:   Oxygen Discharge (Final Oxygen Re-Evaluation):   Initial Exercise Prescription: Initial Exercise Prescription - 02/06/18 1100      Date of Initial Exercise RX and Referring Provider   Date  02/06/18    Referring Provider  Thamas Jaegers MD     Expected Discharge Date  05/16/17      Treadmill   MPH  2.3    Grade  1    Minutes  10    METs  3.08      Recumbant Bike   Level  1.5    Watts  20    Minutes  10    METs  2.79      NuStep   Level  2    SPM  75    Minutes  10    METs  2.8      Prescription Details   Frequency (times per week)  3x    Duration  Progress to 30 minutes of continuous aerobic without signs/symptoms of physical distress      Intensity   THRR 40-80% of Max Heartrate  62-125    Ratings of Perceived Exertion  11-13    Perceived Dyspnea  0-4      Progression   Progression  Continue progressive overload as per policy without signs/symptoms or physical distress.      Resistance Training   Training Prescription  Yes    Weight  2lbs    Reps  10-15       Perform Capillary Blood Glucose checks as needed.  Exercise Prescription Changes: Exercise Prescription Changes    Row Name 02/12/18 1700 03/03/18 1420 03/17/18 1600 03/26/18 1508 04/02/18 1045     Response to Exercise   Blood Pressure (Admit)  110/70  123/88  124/76  124/72  122/72   Blood  Pressure (Exercise)  152/84  140/80  128/72  132/80  138/70   Blood Pressure (Exit)  130/80  130/84  127/74  120/80  122/78   Heart Rate (Admit)  80 bpm  92 bpm  100 bpm  99 bpm  98 bpm   Heart Rate (Exercise)  115 bpm  114 bpm  122 bpm  113 bpm  113 bpm   Heart Rate (Exit)  90 bpm  86 bpm  91 bpm  86 bpm  90 bpm   Rating of Perceived Exertion  (Exercise)  12  13  12  13  12    Perceived Dyspnea (Exercise)  0  0  0  0  0   Symptoms  None  None  None  None  None   Comments  Pt oriented to exercise equipment, pt arrived late  None  Pt reported incisional sorness, decreased handweights  None  None   Duration  Progress to 30 minutes of  aerobic without signs/symptoms of physical distress  Progress to 30 minutes of  aerobic without signs/symptoms of physical distress  Continue with 30 min of aerobic exercise without signs/symptoms of physical distress.  Continue with 30 min of aerobic exercise without signs/symptoms of physical distress.  Continue with 30 min of aerobic exercise without signs/symptoms of physical distress.   Intensity  THRR New  THRR unchanged  THRR unchanged  THRR unchanged  THRR unchanged     Progression   Progression  Continue to progress workloads to maintain intensity without signs/symptoms of physical distress.  Continue to progress workloads to maintain intensity without signs/symptoms of physical distress.  Continue to progress workloads to maintain intensity without signs/symptoms of physical distress.  Continue to progress workloads to maintain intensity without signs/symptoms of physical distress.  Continue to progress workloads to maintain intensity without signs/symptoms of physical distress.   Average METs  2.59  2.47  2.52  2.6  3.23     Resistance Training   Training Prescription  No  Yes  Yes  No  No   Weight  -  3lb  1lbs  -  -   Reps  -  10-15  10-15  -  -   Time  -  10 Minutes  10 Minutes  -  -     Interval Training   Interval Training  No  No  No  No  No     Treadmill   MPH  1.7  2  2.5  2.5  2.5   Grade  1  1  1  1  1    Minutes  10  10  10  10  10    METs  2.32  2.81  3.26  3.26  3.26     Recumbant Bike   Level  1.5  1.5  1.5  1.5  2   Minutes  10  10  10  10  10    METs  2.1  1.6  1.7  1.5  2     NuStep   Level  -  3  3  3  3    SPM  -  85  95  105  105   Minutes  -  10  10  10  10    METs  -   3  2.6  3  3.2     Home Exercise Plan   Plans to continue exercise at  -  -  Home (comment) Walking  Home (comment) Walking  Home (  comment) Walking   Frequency  -  -  Add 2 additional days to program exercise sessions.  Add 2 additional days to program exercise sessions.  Add 2 additional days to program exercise sessions.   Initial Home Exercises Provided  -  -  03/10/18  03/10/18  03/10/18   Row Name 04/09/18 1433             Response to Exercise   Blood Pressure (Admit)  122/74       Blood Pressure (Exercise)  136/84       Blood Pressure (Exit)  138/80       Heart Rate (Admit)  101 bpm       Heart Rate (Exercise)  111 bpm       Heart Rate (Exit)  56 bpm       Rating of Perceived Exertion (Exercise)  13       Perceived Dyspnea (Exercise)  0       Symptoms  None       Comments  None       Duration  Continue with 30 min of aerobic exercise without signs/symptoms of physical distress.       Intensity  THRR unchanged         Progression   Progression  Continue to progress workloads to maintain intensity without signs/symptoms of physical distress.       Average METs  3.29         Resistance Training   Training Prescription  No         Interval Training   Interval Training  No         Treadmill   MPH  2.5       Grade  1       Minutes  10       METs  3.26         Recumbant Bike   Level  2       Minutes  10       METs  3.2         NuStep   Level  3       SPM  105       Minutes  10       METs  3.4         Home Exercise Plan   Plans to continue exercise at  Home (comment) Walking       Frequency  Add 2 additional days to program exercise sessions.       Initial Home Exercises Provided  03/10/18          Exercise Comments: Exercise Comments    Row Name 02/12/18 1710 03/06/18 1418 03/11/18 0750 04/17/18 1435     Exercise Comments  Pt's first day of exericse. Pt oriented to exercise equipment. Pt responded well to workloads. Pt is very hesistant to start  exericse plan, pt states she "hates exercise". Will continue to work with pt and progress as tolerated.   Pt is responding well to exercise prescription. Pt puts forth minimal effort with exercise. Pt is not happy about exercise. Will follow up with pt regarding home exercise.   Reviewed HEP with pt. Pt has major dislike for exercise, but states she willing to walk for exercise. Verbalized understanding of home exercise program.   Reviewed METs and Goals with pt. Pt continuing to respond well to exercise prescription. Pt plans to graduate from program early on 04/25/2018. Will continue to monitor.  Exercise Goals and Review: Exercise Goals    Row Name 02/06/18 1004             Exercise Goals   Increase Physical Activity  Yes       Intervention  Provide advice, education, support and counseling about physical activity/exercise needs.;Develop an individualized exercise prescription for aerobic and resistive training based on initial evaluation findings, risk stratification, comorbidities and participant's personal goals.       Expected Outcomes  Short Term: Attend rehab on a regular basis to increase amount of physical activity.;Long Term: Exercising regularly at least 3-5 days a week.;Long Term: Add in home exercise to make exercise part of routine and to increase amount of physical activity.       Increase Strength and Stamina  Yes       Intervention  Provide advice, education, support and counseling about physical activity/exercise needs.;Develop an individualized exercise prescription for aerobic and resistive training based on initial evaluation findings, risk stratification, comorbidities and participant's personal goals.       Expected Outcomes  Short Term: Increase workloads from initial exercise prescription for resistance, speed, and METs.;Short Term: Perform resistance training exercises routinely during rehab and add in resistance training at home;Long Term: Improve  cardiorespiratory fitness, muscular endurance and strength as measured by increased METs and functional capacity (6MWT)       Able to understand and use rate of perceived exertion (RPE) scale  Yes       Intervention  Provide education and explanation on how to use RPE scale       Expected Outcomes  Short Term: Able to use RPE daily in rehab to express subjective intensity level;Long Term:  Able to use RPE to guide intensity level when exercising independently       Knowledge and understanding of Target Heart Rate Range (THRR)  Yes       Intervention  Provide education and explanation of THRR including how the numbers were predicted and where they are located for reference       Expected Outcomes  Short Term: Able to state/look up THRR;Long Term: Able to use THRR to govern intensity when exercising independently;Short Term: Able to use daily as guideline for intensity in rehab       Able to check pulse independently  Yes       Intervention  Provide education and demonstration on how to check pulse in carotid and radial arteries.;Review the importance of being able to check your own pulse for safety during independent exercise       Expected Outcomes  Short Term: Able to explain why pulse checking is important during independent exercise;Long Term: Able to check pulse independently and accurately       Understanding of Exercise Prescription  Yes       Intervention  Provide education, explanation, and written materials on patient's individual exercise prescription       Expected Outcomes  Short Term: Able to explain program exercise prescription;Long Term: Able to explain home exercise prescription to exercise independently          Exercise Goals Re-Evaluation : Exercise Goals Re-Evaluation    Row Name 03/11/18 0751 04/17/18 1435           Exercise Goal Re-Evaluation   Exercise Goals Review  Increase Physical Activity;Able to understand and use rate of perceived exertion (RPE) scale;Knowledge  and understanding of Target Heart Rate Range (THRR);Understanding of Exercise Prescription;Increase Strength and Stamina;Able to check pulse independently  Understanding  of Exercise Prescription;Increase Physical Activity      Comments  Reviewed HEP with pt. Also reviewed THRR, RPE Scale, weather conditions, endpoints of exercise, NTG use,  warmup and cool down.  Reviewed METs and Goals with pt. Pt puts forth good effort despite not enjoying exercise. Will work with pt to increase Nsustep level to 4 from 3.       Expected Outcomes  Pt will plan to walk 2-3 days for exercise for 15-20 minutes. Pt states she walk her dog for exercise. Spoke with pt regarding limiting the amount the time she stops with dog. Pt verbalized understanding. Will continue to increase cardiorespiratory fitness.   Pt will continue to walk 2-3 days for exercise 20 minutes. Pt has set up silversneakers with local gym. Pt states she needs help with accountability post rehab. Will follow up with pt regarding exercise after graduation.          Discharge Exercise Prescription (Final Exercise Prescription Changes): Exercise Prescription Changes - 04/09/18 1433      Response to Exercise   Blood Pressure (Admit)  122/74    Blood Pressure (Exercise)  136/84    Blood Pressure (Exit)  138/80    Heart Rate (Admit)  101 bpm    Heart Rate (Exercise)  111 bpm    Heart Rate (Exit)  56 bpm    Rating of Perceived Exertion (Exercise)  13    Perceived Dyspnea (Exercise)  0    Symptoms  None    Comments  None    Duration  Continue with 30 min of aerobic exercise without signs/symptoms of physical distress.    Intensity  THRR unchanged      Progression   Progression  Continue to progress workloads to maintain intensity without signs/symptoms of physical distress.    Average METs  3.29      Resistance Training   Training Prescription  No      Interval Training   Interval Training  No      Treadmill   MPH  2.5    Grade  1     Minutes  10    METs  3.26      Recumbant Bike   Level  2    Minutes  10    METs  3.2      NuStep   Level  3    SPM  105    Minutes  10    METs  3.4      Home Exercise Plan   Plans to continue exercise at  Home (comment)   Walking   Frequency  Add 2 additional days to program exercise sessions.    Initial Home Exercises Provided  03/10/18       Nutrition:  Target Goals: Understanding of nutrition guidelines, daily intake of sodium <1529m, cholesterol <203m calories 30% from fat and 7% or less from saturated fats, daily to have 5 or more servings of fruits and vegetables.  Biometrics: Pre Biometrics - 02/06/18 1139      Pre Biometrics   Height  5' 4"  (1.626 m)    Weight  97 kg    Waist Circumference  39.5 inches    Hip Circumference  41 inches    Waist to Hip Ratio  0.96 %    BMI (Calculated)  36.69    Triceps Skinfold  41 mm    % Body Fat  47.1 %    Grip Strength  36 kg    Flexibility  15.5 in    Single Leg Stand  7.22 seconds        Nutrition Therapy Plan and Nutrition Goals: Nutrition Therapy & Goals - 02/06/18 1122      Nutrition Therapy   Diet  heart healthy      Personal Nutrition Goals   Nutrition Goal  Pt to identify and limit food sources of saturated fat, trans fat, refined carbohydrates and sodium    Personal Goal #2  Pt to identify food quantities necessary to achieve weight loss of 6-24 lbs. at graduation from cardiac rehab. Goal wt loss of 20 lb desired.     Personal Goal #3  Pt to foods in order of lean protein, non-starchy veggies +healthy fat, then complex carbohydrate     Personal Goal #4  Pt to eat more frequently across the day, 5-6 smaller meals      Intervention Plan   Intervention  Prescribe, educate and counsel regarding individualized specific dietary modifications aiming towards targeted core components such as weight, hypertension, lipid management, diabetes, heart failure and other comorbidities.    Expected Outcomes  Short Term  Goal: Understand basic principles of dietary content, such as calories, fat, sodium, cholesterol and nutrients.;Long Term Goal: Adherence to prescribed nutrition plan.       Nutrition Assessments: Nutrition Assessments - 02/06/18 1123      MEDFICTS Scores   Pre Score  21       Nutrition Goals Re-Evaluation:   Nutrition Goals Re-Evaluation:   Nutrition Goals Discharge (Final Nutrition Goals Re-Evaluation):   Psychosocial: Target Goals: Acknowledge presence or absence of significant depression and/or stress, maximize coping skills, provide positive support system. Participant is able to verbalize types and ability to use techniques and skills needed for reducing stress and depression.  Initial Review & Psychosocial Screening: Initial Psych Review & Screening - 02/06/18 1145      Initial Review   Current issues with  History of Depression   No current issues.      Family Dynamics   Good Support System?  Yes   Pt reports her children and grandchildren are sources of support for her.      Barriers   Psychosocial barriers to participate in program  There are no identifiable barriers or psychosocial needs.       Quality of Life Scores: Quality of Life - 02/06/18 1147      Quality of Life Scores   Health/Function Pre  23.07 %    Socioeconomic Pre  27 %    Psych/Spiritual Pre  24.33 %    Family Pre  25.5 %    GLOBAL Pre  24.52 %      Scores of 19 and below usually indicate a poorer quality of life in these areas.  A difference of  2-3 points is a clinically meaningful difference.  A difference of 2-3 points in the total score of the Quality of Life Index has been associated with significant improvement in overall quality of life, self-image, physical symptoms, and general health in studies assessing change in quality of life.  PHQ-9: Recent Review Flowsheet Data    Depression screen [facility] 2/9 02/12/2018   Decreased Interest 0   Down, Depressed, Hopeless 0   PHQ - 2 Score  0     Interpretation of Total Score  Total Score Depression Severity:  1-4 = Minimal depression, 5-9 = Mild depression, 10-14 = Moderate depression, 15-19 = Moderately severe depression, 20-27 = Severe depression   Psychosocial Evaluation and  Intervention: Psychosocial Evaluation - 02/12/18 1643      Psychosocial Evaluation & Interventions   Interventions  Encouraged to exercise with the program and follow exercise prescription    Comments  Lailani reports no depression or hopelessness in the last two weeks.  She enjoys working in her yard, cooking, and going to [employer].     Expected Outcomes  Donyel will maintain a positive outlook.     Continue Psychosocial Services   No Follow up required       Psychosocial Re-Evaluation: Psychosocial Re-Evaluation    [place] Name 03/05/18 1449 03/26/18 1457 04/17/18 1127         Psychosocial Re-Evaluation   Current issues with  None Identified  None Identified  None Identified     Comments  No current psychosoical needs.   No current psychosoical needs.   No current psychosoical needs.      Expected Outcomes  [name] will continue to exhibit a positive outlook.   [name] will continue to exhibit a positive outlook.   [name] will continue to exhibit a positive outlook.      Interventions  Encouraged to attend Cardiac Rehabilitation for the exercise  Encouraged to attend Cardiac Rehabilitation for the exercise  Encouraged to attend Cardiac Rehabilitation for the exercise     Continue Psychosocial Services   No Follow up required  No Follow up required  No Follow up required        Psychosocial Discharge (Final Psychosocial Re-Evaluation): Psychosocial Re-Evaluation - 04/17/18 1127      Psychosocial Re-Evaluation   Current issues with  None Identified    Comments  No current psychosoical needs.     Expected Outcomes  [name] will continue to exhibit a positive outlook.     Interventions  Encouraged to attend Cardiac Rehabilitation for the  exercise    Continue Psychosocial Services   No Follow up required       Vocational Rehabilitation: Provide vocational rehab assistance to qualifying candidates.   Vocational Rehab Evaluation & Intervention: Vocational Rehab - 02/06/18 1147      Initial Vocational Rehab Evaluation & Intervention   Assessment shows need for Vocational Rehabilitation  No       Education: Education Goals: Education classes will be provided on a weekly basis, covering required topics. Participant will state understanding/return demonstration of topics presented.  Learning Barriers/Preferences: Learning Barriers/Preferences - 02/06/18 1142      Learning Barriers/Preferences   Learning Barriers  Sight    Learning Preferences  Video;Skilled Demonstration;Written Material       Education Topics: Count Your Pulse:  -Group instruction provided by verbal instruction, demonstration, patient participation and written materials to support subject.  Instructors address importance of being able to find your pulse and how to count your pulse when at home without a heart monitor.  Patients get hands on experience counting their pulse with staff help and individually.   Heart Attack, Angina, and Risk Factor Modification:  -Group instruction provided by verbal instruction, video, and written materials to support subject.  Instructors address signs and symptoms of angina and heart attacks.    Also discuss risk factors for heart disease and how to make changes to improve heart health risk factors.   Functional Fitness:  -Group instruction provided by verbal instruction, demonstration, patient participation, and written materials to support subject.  Instructors address safety measures for doing things around the house.  Discuss how to get up and down off the floor, how to pick things  up properly, how to safely get out of a chair without assistance, and balance training.   Meditation and Mindfulness:  -Group  instruction provided by verbal instruction, patient participation, and written materials to support subject.  Instructor addresses importance of mindfulness and meditation practice to help reduce stress and improve awareness.  Instructor also leads participants through a meditation exercise.    Stretching for Flexibility and Mobility:  -Group instruction provided by verbal instruction, patient participation, and written materials to support subject.  Instructors lead participants through series of stretches that are designed to increase flexibility thus improving mobility.  These stretches are additional exercise for major muscle groups that are typically performed during regular warm up and cool down.   Hands Only CPR:  -Group verbal, video, and participation provides a basic overview of AHA guidelines for community CPR. Role-play of emergencies allow participants the opportunity to practice calling for help and chest compression technique with discussion of AED use.   Hypertension: -Group verbal and written instruction that provides a basic overview of hypertension including the most recent diagnostic guidelines, risk factor reduction with self-care instructions and medication management.    Nutrition I class: Heart Healthy Eating:  -Group instruction provided by PowerPoint slides, verbal discussion, and written materials to support subject matter. The instructor gives an explanation and review of the Therapeutic Lifestyle Changes diet recommendations, which includes a discussion on lipid goals, dietary fat, sodium, fiber, plant stanol/sterol esters, sugar, and the components of a well-balanced, healthy diet.   Nutrition II class: Lifestyle Skills:  -Group instruction provided by PowerPoint slides, verbal discussion, and written materials to support subject matter. The instructor gives an explanation and review of label reading, grocery shopping for heart health, heart healthy recipe  modifications, and ways to make healthier choices when eating out.   Diabetes Question & Answer:  -Group instruction provided by PowerPoint slides, verbal discussion, and written materials to support subject matter. The instructor gives an explanation and review of diabetes co-morbidities, pre- and post-prandial blood glucose goals, pre-exercise blood glucose goals, signs, symptoms, and treatment of hypoglycemia and hyperglycemia, and foot care basics.   Diabetes Blitz:  -Group instruction provided by PowerPoint slides, verbal discussion, and written materials to support subject matter. The instructor gives an explanation and review of the physiology behind type 1 and type 2 diabetes, diabetes medications and rational behind using different medications, pre- and post-prandial blood glucose recommendations and Hemoglobin A1c goals, diabetes diet, and exercise including blood glucose guidelines for exercising safely.    Portion Distortion:  -Group instruction provided by PowerPoint slides, verbal discussion, written materials, and food models to support subject matter. The instructor gives an explanation of serving size versus portion size, changes in portions sizes over the last 20 years, and what consists of a serving from each food group.   Stress Management:  -Group instruction provided by verbal instruction, video, and written materials to support subject matter.  Instructors review role of stress in heart disease and how to cope with stress positively.     Exercising on Your Own:  -Group instruction provided by verbal instruction, power point, and written materials to support subject.  Instructors discuss benefits of exercise, components of exercise, frequency and intensity of exercise, and end points for exercise.  Also discuss use of nitroglycerin and activating EMS.  Review options of places to exercise outside of rehab.  Review guidelines for sex with heart disease.   Cardiac Drugs I:   -Group instruction provided by verbal instruction and written  materials to support subject.  Instructor reviews cardiac drug classes: antiplatelets, anticoagulants, beta blockers, and statins.  Instructor discusses reasons, side effects, and lifestyle considerations for each drug class.   Cardiac Drugs II:  -Group instruction provided by verbal instruction and written materials to support subject.  Instructor reviews cardiac drug classes: angiotensin converting enzyme inhibitors (ACE-I), angiotensin II receptor blockers (ARBs), nitrates, and calcium channel blockers.  Instructor discusses reasons, side effects, and lifestyle considerations for each drug class.   Anatomy and Physiology of the Circulatory System:  Group verbal and written instruction and models provide basic cardiac anatomy and physiology, with the coronary electrical and arterial systems. Review of: AMI, Angina, Valve disease, Heart Failure, Peripheral Artery Disease, Cardiac Arrhythmia, Pacemakers, and the ICD.   Other Education:  -Group or individual verbal, written, or video instructions that support the educational goals of the cardiac rehab program.   Holiday Eating Survival Tips:  -Group instruction provided by PowerPoint slides, verbal discussion, and written materials to support subject matter. The instructor gives patients tips, tricks, and techniques to help them not only survive but enjoy the holidays despite the onslaught of food that accompanies the holidays.   Knowledge Questionnaire Score: Knowledge Questionnaire Score - 02/06/18 1048      Knowledge Questionnaire Score   Pre Score  21/24       Core Components/Risk Factors/Patient Goals at Admission: Personal Goals and Risk Factors at Admission - 02/06/18 1141      Core Components/Risk Factors/Patient Goals on Admission    Weight Management  Yes;Weight Loss    Intervention  Weight Management: Develop a combined nutrition and exercise program designed to  reach desired caloric intake, while maintaining appropriate intake of nutrient and fiber, sodium and fats, and appropriate energy expenditure required for the weight goal.;Weight Management: Provide education and appropriate resources to help participant work on and attain dietary goals.;Weight Management/Obesity: Establish reasonable short term and long term weight goals.;Obesity: Provide education and appropriate resources to help participant work on and attain dietary goals.    Admit Weight  213 lb 13.5 oz (97 kg)    Goal Weight: Long Term  200 lb (90.7 kg)    Expected Outcomes  Long Term: Adherence to nutrition and physical activity/exercise program aimed toward attainment of established weight goal;Short Term: Continue to assess and modify interventions until short term weight is achieved;Weight Loss: Understanding of general recommendations for a balanced deficit meal plan, which promotes 1-2 lb weight loss per week and includes a negative energy balance of [phone] kcal/d;Understanding recommendations for meals to include 15-35% energy as protein, 25-35% energy from fat, 35-60% energy from carbohydrates, less than 273m of dietary cholesterol, 20-35 gm of total fiber daily;Understanding of distribution of calorie intake throughout the day with the consumption of 4-5 meals/snacks    Diabetes  Yes    Intervention  Provide education about signs/symptoms and action to take for hypo/hyperglycemia.;Provide education about proper nutrition, including hydration, and aerobic/resistive exercise prescription along with prescribed medications to achieve blood glucose in normal ranges: Fasting glucose 65-99 mg/dL    Expected Outcomes  Short Term: Participant verbalizes understanding of the signs/symptoms and immediate care of hyper/hypoglycemia, proper foot care and importance of medication, aerobic/resistive exercise and nutrition plan for blood glucose control.;Long Term: Attainment of HbA1C < 7%.    Hypertension   Yes    Intervention  Provide education on lifestyle modifcations including regular physical activity/exercise, weight management, moderate sodium restriction and increased consumption of fresh fruit, vegetables, and low fat dairy,  alcohol moderation, and smoking cessation.;Monitor prescription use compliance.    Expected Outcomes  Short Term: Continued assessment and intervention until BP is < 140/38m HG in hypertensive participants. < 130/869mHG in hypertensive participants with diabetes, heart failure or chronic kidney disease.;Long Term: Maintenance of blood pressure at goal levels.    Lipids  Yes    Intervention  Provide education and support for participant on nutrition & aerobic/resistive exercise along with prescribed medications to achieve LDL <7031mHDL >59m41m  Expected Outcomes  Short Term: Participant states understanding of desired cholesterol values and is compliant with medications prescribed. Participant is following exercise prescription and nutrition guidelines.;Long Term: Cholesterol controlled with medications as prescribed, with individualized exercise RX and with personalized nutrition plan. Value goals: LDL < 70mg7mL > 40 mg.       Core Components/Risk Factors/Patient Goals Review:  Goals and Risk Factor Review    Row Name 02/12/18 1653 03/05/18 1450 03/26/18 1457 04/17/18 1127       Core Components/Risk Factors/Patient Goals Review   Personal Goals Review  Lipids;Hypertension;Diabetes;Weight Management/Obesity  Lipids;Hypertension;Diabetes;Weight Management/Obesity  Lipids;Hypertension;Diabetes;Weight Management/Obesity  Lipids;Hypertension;Diabetes;Weight Management/Obesity    Review  Pt with multiple CAD RFs participating in CR.  Pt states "I hate exercise." However, Naylee would like to increase her energy and stamina.  Pt with multiple CAD RFs participating in CR.  [name] is tolerating exercise well.  Despite "not liking exercise" she continues to demonstrate  willingness to come.   Pt with multiple CAD RFs participating in CR.  [name] is tolerating exercise well.  She feels that she has increased motivation and has increased her the amount of exercise she partakes in at home.  Pt with multiple CAD RFs participating in CR.  [name] is tolerating exercise well despite absences.  Encouraged patient to attend regularly during her last sessions.     Expected Outcomes  Pt will continue to participate in CR exercise, nutrition, and lifestyle modification opportunities.   Pt will continue to participate in CR exercise, nutrition, and lifestyle modification opportunities.   Pt will continue to participate in CR exercise, nutrition, and lifestyle modification opportunities.   Pt will continue to participate in CR exercise, nutrition, and lifestyle modification opportunities.        Core Components/Risk Factors/Patient Goals at Discharge (Final Review):  Goals and Risk Factor Review - 04/17/18 1127      Core Components/Risk Factors/Patient Goals Review   Personal Goals Review  Lipids;Hypertension;Diabetes;Weight Management/Obesity    Review  Pt with multiple CAD RFs participating in CR.  [name] is tolerating exercise well despite absences.  Encouraged patient to attend regularly during her last sessions.     Expected Outcomes  Pt will continue to participate in CR exercise, nutrition, and lifestyle modification opportunities.        ITP Comments: ITP Comments    Row Name 02/06/18 1003 02/12/18 1636 03/05/18 1447 03/26/18 1456 04/17/18 1125   ITP Comments  Dr. TraciFransico Himical Director   Pt started exercise today and tolerated it well.   30 Day ITP Review. Pt is tolerating exercise well.  Pt demonstrates willingness to exercise despite verbalization of not liking exercise. She does note an improvement in her stamina.   30 Day ITP Review. Pt is tolerating exercise well.  [name] endorses that she has increased her exercise routine at home.  She has gained more motivation.    30 Day ITP Review. [name] is tolerating exercise when she is in attendance at Cardiac Rehab.  She has had increased absences.  Pt is expecte to graduate 04/25/18.  Encouraged pt to attend as much as possible during last visits.       Comments: See ITP Comments.

## 2018-04-18 ENCOUNTER — Encounter (HOSPITAL_COMMUNITY)
Admission: RE | Admit: 2018-04-18 | Discharge: 2018-04-18 | Disposition: A | Discharge status: Home / Self Care | Payer: Medicare Other | Source: Ambulatory Visit | Attending: Interventional Cardiology | Admitting: Interventional Cardiology

## 2018-04-18 ENCOUNTER — Encounter (HOSPITAL_COMMUNITY): Payer: Medicare Other

## 2018-04-18 DIAGNOSIS — Z952 Presence of prosthetic heart valve: Secondary | ICD-10-CM

## 2018-04-21 ENCOUNTER — Encounter (HOSPITAL_COMMUNITY): Payer: Medicare Other

## 2018-04-21 ENCOUNTER — Encounter (HOSPITAL_COMMUNITY)
Admission: RE | Admit: 2018-04-21 | Discharge: 2018-04-21 | Disposition: A | Discharge status: Home / Self Care | Payer: Medicare Other | Source: Ambulatory Visit | Attending: Interventional Cardiology | Admitting: Interventional Cardiology

## 2018-04-21 VITALS — Ht 64.0 in | Wt 216.1 lb

## 2018-04-21 DIAGNOSIS — Z952 Presence of prosthetic heart valve: Secondary | ICD-10-CM | POA: Diagnosis not present

## 2018-04-23 ENCOUNTER — Encounter (HOSPITAL_COMMUNITY): Payer: Medicare Other

## 2018-04-23 ENCOUNTER — Encounter (HOSPITAL_COMMUNITY)
Admission: RE | Admit: 2018-04-23 | Discharge: 2018-04-23 | Disposition: A | Discharge status: Home / Self Care | Payer: Medicare Other | Source: Ambulatory Visit | Attending: Interventional Cardiology | Admitting: Interventional Cardiology

## 2018-04-23 DIAGNOSIS — Z952 Presence of prosthetic heart valve: Secondary | ICD-10-CM

## 2018-04-25 ENCOUNTER — Encounter (HOSPITAL_COMMUNITY)
Admission: RE | Admit: 2018-04-25 | Discharge: 2018-04-25 | Disposition: A | Discharge status: Home / Self Care | Payer: Medicare Other | Source: Ambulatory Visit | Attending: Interventional Cardiology | Admitting: Interventional Cardiology

## 2018-04-25 ENCOUNTER — Encounter (HOSPITAL_COMMUNITY): Payer: Medicare Other

## 2018-04-25 DIAGNOSIS — Z952 Presence of prosthetic heart valve: Secondary | ICD-10-CM | POA: Diagnosis not present

## 2018-04-28 ENCOUNTER — Encounter (HOSPITAL_COMMUNITY): Payer: Medicare Other

## 2018-05-02 ENCOUNTER — Encounter (HOSPITAL_COMMUNITY): Payer: Medicare Other

## 2018-05-05 ENCOUNTER — Encounter (HOSPITAL_COMMUNITY): Payer: Medicare Other

## 2018-05-05 NOTE — Progress Notes (Signed)
Discharge Progress Report  Patient Details  Name: Whitney [name] MRN: [id number] Date of Birth: 20-Jun-1955 Referring Provider:     CARDIAC REHAB PHASE II ORIENTATION from 02/06/2018 in [place]  Referring Provider  Thamas Jaegers MD        Number of Visits: 25  Reason for Discharge:  Patient reached a stable level of exercise. Patient independent in their exercise. Patient has met program and personal goals.  Smoking History:  Social History   Tobacco Use  Smoking Status Never Smoker  Smokeless Tobacco Never Used    Diagnosis:  S/P TAVR (transcatheter aortic valve replacement)  ADL UCSD:   Initial Exercise Prescription: Initial Exercise Prescription - 02/06/18 1100      Date of Initial Exercise RX and Referring Provider   Date  02/06/18    Referring Provider  Thamas Jaegers MD     Expected Discharge Date  05/16/17      Treadmill   MPH  2.3    Grade  1    Minutes  10    METs  3.08      Recumbant Bike   Level  1.5    Watts  20    Minutes  10    METs  2.79      NuStep   Level  2    SPM  75    Minutes  10    METs  2.8      Prescription Details   Frequency (times per week)  3x    Duration  Progress to 30 minutes of continuous aerobic without signs/symptoms of physical distress      Intensity   THRR 40-80% of Max Heartrate  62-125    Ratings of Perceived Exertion  11-13    Perceived Dyspnea  0-4      Progression   Progression  Continue progressive overload as per policy without signs/symptoms or physical distress.      Resistance Training   Training Prescription  Yes    Weight  2lbs    Reps  10-15       Discharge Exercise Prescription (Final Exercise Prescription Changes): Exercise Prescription Changes - 04/25/18 1400      Response to Exercise   Blood Pressure (Admit)  124/64    Blood Pressure (Exercise)  134/76    Blood Pressure (Exit)  112/78    Heart Rate (Admit)  93 bpm    Heart Rate (Exercise)   121 bpm    Heart Rate (Exit)  86 bpm    Rating of Perceived Exertion (Exercise)  12    Perceived Dyspnea (Exercise)  0    Symptoms  None    Comments  Pt graduated from Cardiac Rehab     Duration  Continue with 30 min of aerobic exercise without signs/symptoms of physical distress.    Intensity  THRR unchanged      Progression   Progression  Continue to progress workloads to maintain intensity without signs/symptoms of physical distress.    Average METs  2.6      Resistance Training   Training Prescription  No      Interval Training   Interval Training  No      Treadmill   MPH  2.5    Grade  10    Minutes  10    METs  3.26      Recumbant Bike   Level  2    Minutes  10    METs  2.2      NuStep   Level  3    SPM  75    Minutes  10    METs  2.3      Home Exercise Plan   Plans to continue exercise at  Home (comment)   Walking & Silversneakers   Frequency  Add 2 additional days to program exercise sessions.    Initial Home Exercises Provided  03/10/18       Functional Capacity: 6 Minute Walk    Row Name 02/06/18 1137 04/21/18 1338       6 Minute Walk   Phase  Initial  Discharge    Distance  1405 feet  1600 feet    Distance % Change  -  13.88 %    Distance Feet Change  -  195 ft    Walk Time  6 minutes  6 minutes    # of Rest Breaks  0  0    MPH  2.66  3.03    METS  3.04  3.38    RPE  12  12    Perceived Dyspnea   1  0    VO2 Peak  10.67  11.83    Symptoms  Yes (comment)  No    Comments  Mild SOB +1  -    Resting HR  76 bpm  86 bpm    Resting BP  138/68  120/80    Resting Oxygen Saturation   97 %  -    Exercise Oxygen Saturation  during 6 min walk  95 %  -    Max Ex. HR  105 bpm  119 bpm    Max Ex. BP  148/70  132/86    2 Minute Post BP  128/80  112/70       Psychological, QOL, Others - Outcomes: PHQ 2/9: Depression screen [facility] 2/9 04/28/2018 02/12/2018  Decreased Interest 0 0  Down, Depressed, Hopeless 0 0  PHQ - 2 Score 0 0    Quality of  Life: Quality of Life - 04/25/18 1401      Quality of Life Scores   Health/Function Pre  23.07 %    Health/Function Post  27.56 %    Health/Function % Change  19.46 %    Socioeconomic Pre  27 %    Socioeconomic Post  23.25 %    Socioeconomic % Change   -13.89 %    Psych/Spiritual Pre  24.33 %    Psych/Spiritual Post  --   Pt did not answer Psych/Spiritual Questions   Family Pre  25.5 %    Family Post  25.5 %    Family % Change  0 %    GLOBAL Pre  24.52 %    GLOBAL Post  26.36 %    GLOBAL % Change  7.5 %       Personal Goals: Goals established at orientation with interventions provided to work toward goal. Personal Goals and Risk Factors at Admission - 02/06/18 1141      Core Components/Risk Factors/Patient Goals on Admission    Weight Management  Yes;Weight Loss    Intervention  Weight Management: Develop a combined nutrition and exercise program designed to reach desired caloric intake, while maintaining appropriate intake of nutrient and fiber, sodium and fats, and appropriate energy expenditure required for the weight goal.;Weight Management: Provide education and appropriate resources to help participant work on and attain dietary goals.;Weight Management/Obesity: Establish reasonable short term and long term weight  goals.;Obesity: Provide education and appropriate resources to help participant work on and attain dietary goals.    Admit Weight  213 lb 13.5 oz (97 kg)    Goal Weight: Long Term  200 lb (90.7 kg)    Expected Outcomes  Long Term: Adherence to nutrition and physical activity/exercise program aimed toward attainment of established weight goal;Short Term: Continue to assess and modify interventions until short term weight is achieved;Weight Loss: Understanding of general recommendations for a balanced deficit meal plan, which promotes 1-2 lb weight loss per week and includes a negative energy balance of [phone] kcal/d;Understanding recommendations for meals to include  15-35% energy as protein, 25-35% energy from fat, 35-60% energy from carbohydrates, less than 272m of dietary cholesterol, 20-35 gm of total fiber daily;Understanding of distribution of calorie intake throughout the day with the consumption of 4-5 meals/snacks    Diabetes  Yes    Intervention  Provide education about signs/symptoms and action to take for hypo/hyperglycemia.;Provide education about proper nutrition, including hydration, and aerobic/resistive exercise prescription along with prescribed medications to achieve blood glucose in normal ranges: Fasting glucose 65-99 mg/dL    Expected Outcomes  Short Term: Participant verbalizes understanding of the signs/symptoms and immediate care of hyper/hypoglycemia, proper foot care and importance of medication, aerobic/resistive exercise and nutrition plan for blood glucose control.;Long Term: Attainment of HbA1C < 7%.    Hypertension  Yes    Intervention  Provide education on lifestyle modifcations including regular physical activity/exercise, weight management, moderate sodium restriction and increased consumption of fresh fruit, vegetables, and low fat dairy, alcohol moderation, and smoking cessation.;Monitor prescription use compliance.    Expected Outcomes  Short Term: Continued assessment and intervention until BP is < 140/955mHG in hypertensive participants. < 130/8020mG in hypertensive participants with diabetes, heart failure or chronic kidney disease.;Long Term: Maintenance of blood pressure at goal levels.    Lipids  Yes    Intervention  Provide education and support for participant on nutrition & aerobic/resistive exercise along with prescribed medications to achieve LDL <65m58mDL >40mg30m Expected Outcomes  Short Term: Participant states understanding of desired cholesterol values and is compliant with medications prescribed. Participant is following exercise prescription and nutrition guidelines.;Long Term: Cholesterol controlled with  medications as prescribed, with individualized exercise RX and with personalized nutrition plan. Value goals: LDL < 65mg,75m > 40 mg.        Personal Goals Discharge: Goals and Risk Factor Review    Row Name 02/12/18 1653 03/05/18 1450 03/26/18 1457 04/17/18 1127 04/28/18 0913     Core Components/Risk Factors/Patient Goals Review   Personal Goals Review  Lipids;Hypertension;Diabetes;Weight Management/Obesity  Lipids;Hypertension;Diabetes;Weight Management/Obesity  Lipids;Hypertension;Diabetes;Weight Management/Obesity  Lipids;Hypertension;Diabetes;Weight Management/Obesity  Lipids;Hypertension;Diabetes;Weight Management/Obesity   Review  Pt with multiple CAD RFs participating in CR.  Pt states "I hate exercise." However, Arnetha would like to increase her energy and stamina.  Pt with multiple CAD RFs participating in CR.  [name] is tolerating exercise well.  Despite "not liking exercise" she continues to demonstrate willingness to come.   Pt with multiple CAD RFs participating in CR.  [name] is tolerating exercise well.  She feels that she has increased motivation and has increased her the amount of exercise she partakes in at home.  Pt with multiple CAD RFs participating in CR.  [name] is tolerating exercise well despite absences.  Encouraged patient to attend regularly during her last sessions.   [name] has graduated from [id number] completed sessions.  She feels that  she has increased her motivation and found an exercise routine.   Expected Outcomes  Pt will continue to participate in CR exercise, nutrition, and lifestyle modification opportunities.   Pt will continue to participate in CR exercise, nutrition, and lifestyle modification opportunities.   Pt will continue to participate in CR exercise, nutrition, and lifestyle modification opportunities.   Pt will continue to participate in CR exercise, nutrition, and lifestyle modification opportunities.   Pt will continue to participate in exercise, nutrition, and  lifestyle modification opportunities.  [name] plans to utilize her Silver Sneakers benefit.       Exercise Goals and Review: Exercise Goals    Row Name 02/06/18 1004             Exercise Goals   Increase Physical Activity  Yes       Intervention  Provide advice, education, support and counseling about physical activity/exercise needs.;Develop an individualized exercise prescription for aerobic and resistive training based on initial evaluation findings, risk stratification, comorbidities and participant's personal goals.       Expected Outcomes  Short Term: Attend rehab on a regular basis to increase amount of physical activity.;Long Term: Exercising regularly at least 3-5 days a week.;Long Term: Add in home exercise to make exercise part of routine and to increase amount of physical activity.       Increase Strength and Stamina  Yes       Intervention  Provide advice, education, support and counseling about physical activity/exercise needs.;Develop an individualized exercise prescription for aerobic and resistive training based on initial evaluation findings, risk stratification, comorbidities and participant's personal goals.       Expected Outcomes  Short Term: Increase workloads from initial exercise prescription for resistance, speed, and METs.;Short Term: Perform resistance training exercises routinely during rehab and add in resistance training at home;Long Term: Improve cardiorespiratory fitness, muscular endurance and strength as measured by increased METs and functional capacity (6MWT)       Able to understand and use rate of perceived exertion (RPE) scale  Yes       Intervention  Provide education and explanation on how to use RPE scale       Expected Outcomes  Short Term: Able to use RPE daily in rehab to express subjective intensity level;Long Term:  Able to use RPE to guide intensity level when exercising independently       Knowledge and understanding of Target Heart Rate Range  (THRR)  Yes       Intervention  Provide education and explanation of THRR including how the numbers were predicted and where they are located for reference       Expected Outcomes  Short Term: Able to state/look up THRR;Long Term: Able to use THRR to govern intensity when exercising independently;Short Term: Able to use daily as guideline for intensity in rehab       Able to check pulse independently  Yes       Intervention  Provide education and demonstration on how to check pulse in carotid and radial arteries.;Review the importance of being able to check your own pulse for safety during independent exercise       Expected Outcomes  Short Term: Able to explain why pulse checking is important during independent exercise;Long Term: Able to check pulse independently and accurately       Understanding of Exercise Prescription  Yes       Intervention  Provide education, explanation, and written materials on patient's individual exercise prescription  Expected Outcomes  Short Term: Able to explain program exercise prescription;Long Term: Able to explain home exercise prescription to exercise independently          Exercise Goals Re-Evaluation: Exercise Goals Re-Evaluation    Row Name 03/11/18 0751 04/17/18 1435 04/25/18 1413         Exercise Goal Re-Evaluation   Exercise Goals Review  Increase Physical Activity;Able to understand and use rate of perceived exertion (RPE) scale;Knowledge and understanding of Target Heart Rate Range (THRR);Understanding of Exercise Prescription;Increase Strength and Stamina;Able to check pulse independently  Understanding of Exercise Prescription;Increase Physical Activity  Understanding of Exercise Prescription;Increase Physical Activity     Comments  Reviewed HEP with pt. Also reviewed THRR, RPE Scale, weather conditions, endpoints of exercise, NTG use,  warmup and cool down.  Reviewed METs and Goals with pt. Pt puts forth good effort despite not enjoying  exercise. Will work with pt to increase Nsustep level to 4 from 3.   Pt completed 25 sessions of Cardiac Rehab. Pt increased functional capacity by 13.88%. Pt increase post 6MWT distance by 114f. Pt's goals was to increase strength and stamina. Pt states she feels like she has accomplished that goal. She is able to do more with her family and get back to cooking large meals for family.      Expected Outcomes  Pt will plan to walk 2-3 days for exercise for 15-20 minutes. Pt states she walk her dog for exercise. Spoke with pt regarding limiting the amount the time she stops with dog. Pt verbalized understanding. Will continue to increase cardiorespiratory fitness.   Pt will continue to walk 2-3 days for exercise 20 minutes. Pt has set up silversneakers with local gym. Pt states she needs help with accountability post rehab. Will follow up with pt regarding exercise after graduation.   Pt will continue to exercise 3-4 days a week 30-45 minutes by walking or using SilverSneakers. Encouarged pt to continue to exericse despite lacking motivation to exercise on her own.         Nutrition & Weight - Outcomes: Pre Biometrics - 02/06/18 1139      Pre Biometrics   Height  _0  (1.626 m)    Weight  97 kg    Waist Circumference  39.5 inches    Hip Circumference  41 inches    Waist to Hip Ratio  0.96 %    BMI (Calculated)  36.69    Triceps Skinfold  41 mm    % Body Fat  47.1 %    Grip Strength  36 kg    Flexibility  15.5 in    Single Leg Stand  7.22 seconds      Post Biometrics - 04/21/18 1339       Post  Biometrics   Height  _1  (1.626 m)    Weight  98 kg    Waist Circumference  42 inches    Hip Circumference  44 inches    Waist to Hip Ratio  0.95 %    BMI (Calculated)  37.07    Triceps Skinfold  42 mm    % Body Fat  48.4 %    Grip Strength  36 kg    Flexibility  16 in    Single Leg Stand  8.88 seconds       Nutrition: Nutrition Therapy & Goals - 05/02/18 1126      Nutrition Therapy    Diet  heart healthy      Personal  Nutrition Goals   Nutrition Goal  Pt to identify and limit food sources of saturated fat, trans fat, refined carbohydrates and sodium      Intervention Plan   Intervention  Prescribe, educate and counsel regarding individualized specific dietary modifications aiming towards targeted core components such as weight, hypertension, lipid management, diabetes, heart failure and other comorbidities.    Expected Outcomes  Short Term Goal: Understand basic principles of dietary content, such as calories, fat, sodium, cholesterol and nutrients.;Long Term Goal: Adherence to prescribed nutrition plan.       Nutrition Discharge: Nutrition Assessments - 05/02/18 1126      MEDFICTS Scores   Pre Score  21    Post Score  12    Score Difference  -9       Education Questionnaire Score: Knowledge Questionnaire Score - 04/25/18 1356      Knowledge Questionnaire Score   Pre Score  21/24    Post Score  19/24       Goals reviewed with patient; copy given to patient.

## 2018-05-09 ENCOUNTER — Encounter (HOSPITAL_COMMUNITY): Payer: Medicare Other

## 2018-05-12 ENCOUNTER — Encounter (HOSPITAL_COMMUNITY): Payer: Medicare Other

## 2018-05-13 ENCOUNTER — Ambulatory Visit (HOSPITAL_COMMUNITY): Payer: Medicare Other | Attending: Cardiology

## 2018-05-13 ENCOUNTER — Other Ambulatory Visit: Payer: Self-pay

## 2018-05-13 DIAGNOSIS — Z953 Presence of xenogenic heart valve: Secondary | ICD-10-CM | POA: Insufficient documentation

## 2018-05-14 ENCOUNTER — Encounter (HOSPITAL_COMMUNITY): Payer: Medicare Other

## 2018-05-16 ENCOUNTER — Encounter (HOSPITAL_COMMUNITY): Payer: Medicare Other

## 2018-12-02 ENCOUNTER — Telehealth: Payer: Self-pay | Admitting: Interventional Cardiology

## 2018-12-02 DIAGNOSIS — R0602 Shortness of breath: Secondary | ICD-10-CM

## 2018-12-02 NOTE — Telephone Encounter (Signed)
Follow Up   Patient calling back in regards to chest pain. Please give patient a call back.

## 2018-12-02 NOTE — Telephone Encounter (Signed)
Left message to call back  

## 2018-12-02 NOTE — Telephone Encounter (Signed)
Spoke with patient who is reporting she has been having a discomfort in the same spot in her chest for a long time.  She has also had a discomfort into her shoulder that she admits to pressing on a lot.  She reports she has had that since before/during and after her surgery.  She has discussed that with Dr Irish Lack in the past.  Her main concern seems to be the constant SOB she has had for many months now.  She has no s/s of COVID and was tested a couple of weeks ago (negative).  She uses a mask when out in public and practices hand hygiene and social distancing.  The SOB feeling she decides as a feeling of being out of breath and it is more pronounced with bending.  She is concerned maybe her lungs were damaged during her surgery and thinks maybe she needs to be evaluated by a pulmonologist.  Reassurance given to patient since the s/s have been stable though constant and her most recent cardiac cath demonstrated no CAD.  Advised I will send information to Dr Irish Lack to review and someone will call her back with further instructions.

## 2018-12-02 NOTE — Telephone Encounter (Signed)
New message  Pt c/o of Chest Pain: STAT if CP now or developed within 24 hours  1. Are you having CP right now?no, patient had chest pains this past weekend, patient states that she has had chest for the last 3 months    2. Are you experiencing any other symptoms (ex. SOB, nausea, vomiting, sweating)? SOB   3. How long have you been experiencing CP?about 3 months per patient   4. Is your CP continuous or coming and going? continuous   5. Have you taken Nitroglycerin?no  ?

## 2018-12-03 NOTE — Telephone Encounter (Signed)
Ok to order PFTs for shortness of breath. Spirometry pre and post and DLCO.  I doubt any damage to lungs during surgery.

## 2018-12-03 NOTE — Telephone Encounter (Signed)
Order placed for PFTs. Called patient to inform she would receive a call to schedule this.   Left message to call back.

## 2018-12-04 NOTE — Telephone Encounter (Signed)
Spoke with pt and made her aware of order.  Pt verbalized understanding and was appreciative for call.

## 2018-12-04 NOTE — Telephone Encounter (Signed)
New Message    Patient returning Whitney [name]'s call from yesterday about PFT's that were ordered for patient.  Please call patient back to discuss.

## 2018-12-11 ENCOUNTER — Telehealth: Payer: Self-pay | Admitting: Interventional Cardiology

## 2018-12-11 NOTE — Telephone Encounter (Signed)
Checking with [facility] to determine status of PFTs. It looks like order was placed for [facility] instead of [facility]. [name] will look into this and let me know the status and if I need to change location. Will call the patient once I have that information.

## 2018-12-11 NOTE — Telephone Encounter (Signed)
[name] said that Whitney [name] is only doing 2 PFTs a day at this time. She is going to call hospital to see if it can be done sooner there. She will have patient arranged for appointment. Patient made aware that she will be contacted in the near future to arrange.

## 2018-12-11 NOTE — Telephone Encounter (Signed)
New Message     Pt is wondering about PFT test and has not gotten a call to schedule it and is not sure what she should do      Please call

## 2018-12-12 NOTE — Telephone Encounter (Signed)
Called and made patient aware that her Pulmonary Function test has been scheduled for 12/19/18 at 2:30 PM at [facility]. Patient will have to have a COVID test done prior to her PFTs and has been scheduled for 12/15/18 at 1:05 PM. Patient has been given instructions below and they have been sent to her Mychart. Patient verbalized understanding and thanked me for the call.  Your Pre-procedure COVID-19 Testing will be done on 12/15/18 at 1:05 PM at [place] at [address], [place], [place] [address]. Once you arrive at the testing site, stay in the right hand lane, go under the building overhang not the tent. If you are tested under the tent your results may not be back before your procedure. Please be on time for your appointment.  After your swab you will be given a mask to wear and instructed to go home and quarantine/no visitors until after your procedure. If you test positive you will be notified and your procedure will be cancelled.   Your Pulmonary Function Test is scheduled at [facility] on 12/19/18 at 2:30 PM. Please arrive at 2:15 PM.  Patient Instructions for Pulmonary Function Test  1. Do not smoke within at least 1 hour before the test. 2. Do not consume Caffeine 4 hours prior to the test. 3. Do not consume Alcohol within 4 hours prior to testing. 4. Do not perform any Vigorous Exercise within 30 minutes before the test. 5. Do not wear clothes that restrict the chest area or abdomen. 6. Do not use albuterol or Xopenex 3 hours before the test or any other nebulizer medications or inhalers.

## 2018-12-15 ENCOUNTER — Other Ambulatory Visit (HOSPITAL_COMMUNITY)
Admission: RE | Admit: 2018-12-15 | Discharge: 2018-12-15 | Disposition: A | Discharge status: Home / Self Care | Payer: Medicare Other | Source: Ambulatory Visit | Attending: Interventional Cardiology | Admitting: Interventional Cardiology

## 2018-12-15 DIAGNOSIS — Z20828 Contact with and (suspected) exposure to other viral communicable diseases: Secondary | ICD-10-CM | POA: Diagnosis not present

## 2018-12-15 DIAGNOSIS — Z01812 Encounter for preprocedural laboratory examination: Secondary | ICD-10-CM | POA: Insufficient documentation

## 2018-12-15 LAB — SARS CORONAVIRUS 2 (TAT 6-24 HRS): SARS Coronavirus 2: NEGATIVE

## 2018-12-17 ENCOUNTER — Other Ambulatory Visit: Payer: Self-pay

## 2018-12-17 ENCOUNTER — Emergency Department (HOSPITAL_COMMUNITY): Payer: Medicare Other

## 2018-12-17 ENCOUNTER — Encounter (HOSPITAL_COMMUNITY): Payer: Self-pay | Admitting: Radiology

## 2018-12-17 ENCOUNTER — Emergency Department (HOSPITAL_COMMUNITY)
Admission: EM | Admit: 2018-12-17 | Discharge: 2018-12-17 | Disposition: A | Discharge status: Home / Self Care | Payer: Medicare Other | Attending: Emergency Medicine | Admitting: Emergency Medicine

## 2018-12-17 DIAGNOSIS — R200 Anesthesia of skin: Secondary | ICD-10-CM | POA: Insufficient documentation

## 2018-12-17 DIAGNOSIS — E119 Type 2 diabetes mellitus without complications: Secondary | ICD-10-CM | POA: Insufficient documentation

## 2018-12-17 DIAGNOSIS — R42 Dizziness and giddiness: Secondary | ICD-10-CM | POA: Insufficient documentation

## 2018-12-17 DIAGNOSIS — Z7982 Long term (current) use of aspirin: Secondary | ICD-10-CM | POA: Diagnosis not present

## 2018-12-17 DIAGNOSIS — H538 Other visual disturbances: Secondary | ICD-10-CM | POA: Insufficient documentation

## 2018-12-17 DIAGNOSIS — G510 Bell's palsy: Secondary | ICD-10-CM | POA: Diagnosis not present

## 2018-12-17 DIAGNOSIS — R29810 Facial weakness: Secondary | ICD-10-CM | POA: Diagnosis present

## 2018-12-17 LAB — I-STAT CHEM 8, ED
BUN: 18 mg/dL (ref 8–23)
Calcium, Ion: 1.16 mmol/L (ref 1.15–1.40)
Chloride: 104 mmol/L (ref 98–111)
Creatinine, Ser: 0.8 mg/dL (ref 0.44–1.00)
Glucose, Bld: 74 mg/dL (ref 70–99)
HCT: 47 % — ABNORMAL HIGH (ref 36.0–46.0)
Hemoglobin: 16 g/dL — ABNORMAL HIGH (ref 12.0–15.0)
Potassium: 3.9 mmol/L (ref 3.5–5.1)
Sodium: 141 mmol/L (ref 135–145)
TCO2: 27 mmol/L (ref 22–32)

## 2018-12-17 LAB — DIFFERENTIAL
Abs Immature Granulocytes: 0.01 10*3/uL (ref 0.00–0.07)
Basophils Absolute: 0 10*3/uL (ref 0.0–0.1)
Basophils Relative: 1 %
Eosinophils Absolute: 0.1 10*3/uL (ref 0.0–0.5)
Eosinophils Relative: 2 %
Immature Granulocytes: 0 %
Lymphocytes Relative: 39 %
Lymphs Abs: 1.4 10*3/uL (ref 0.7–4.0)
Monocytes Absolute: 0.4 10*3/uL (ref 0.1–1.0)
Monocytes Relative: 12 %
Neutro Abs: 1.7 10*3/uL (ref 1.7–7.7)
Neutrophils Relative %: 46 %

## 2018-12-17 LAB — COMPREHENSIVE METABOLIC PANEL
ALT: 13 U/L (ref 0–44)
AST: 39 U/L (ref 15–41)
Albumin: 3.3 g/dL — ABNORMAL LOW (ref 3.5–5.0)
Alkaline Phosphatase: 68 U/L (ref 38–126)
Anion gap: 9 (ref 5–15)
BUN: 16 mg/dL (ref 8–23)
CO2: 27 mmol/L (ref 22–32)
Calcium: 9.1 mg/dL (ref 8.9–10.3)
Chloride: 103 mmol/L (ref 98–111)
Creatinine, Ser: 0.93 mg/dL (ref 0.44–1.00)
GFR calc Af Amer: >60 mL/min (ref >60)
GFR calc non Af Amer: >60 mL/min (ref >60)
Glucose, Bld: 80 mg/dL (ref 70–99)
Potassium: 4.2 mmol/L (ref 3.5–5.1)
Sodium: 139 mmol/L (ref 135–145)
Total Bilirubin: 1.4 mg/dL — ABNORMAL HIGH (ref 0.3–1.2)
Total Protein: 6.6 g/dL (ref 6.5–8.1)

## 2018-12-17 LAB — CBC
HCT: 46.1 % — ABNORMAL HIGH (ref 36.0–46.0)
Hemoglobin: 14.9 g/dL (ref 12.0–15.0)
MCH: 28.5 pg (ref 26.0–34.0)
MCHC: 32.3 g/dL (ref 30.0–36.0)
MCV: 88.3 fL (ref 80.0–100.0)
Platelets: 79 10*3/uL — ABNORMAL LOW (ref 150–400)
RBC: 5.22 MIL/uL — ABNORMAL HIGH (ref 3.87–5.11)
RDW: 15.8 % — ABNORMAL HIGH (ref 11.5–15.5)
WBC: 3.6 10*3/uL — ABNORMAL LOW (ref 4.0–10.5)
nRBC: 0 % (ref 0.0–0.2)

## 2018-12-17 LAB — CBG MONITORING, ED: Glucose-Capillary: 77 mg/dL (ref 70–99)

## 2018-12-17 LAB — PROTIME-INR
INR: 1.1 (ref 0.8–1.2)
Prothrombin Time: 14.3 seconds (ref 11.4–15.2)

## 2018-12-17 LAB — APTT: aPTT: 37 seconds — ABNORMAL HIGH (ref 24–36)

## 2018-12-17 MED ORDER — VALACYCLOVIR HCL 1 G PO TABS: 1000.0000 mg | ORAL_TABLET | Freq: Three times a day (TID) | ORAL | 0 refills | Status: AC

## 2018-12-17 MED ORDER — SODIUM CHLORIDE 0.9% FLUSH: 3.0000 mL | Freq: Once | INTRAVENOUS | Status: DC

## 2018-12-17 MED ORDER — VALACYCLOVIR HCL 500 MG PO TABS
1000.0000 mg | ORAL_TABLET | Freq: Once | ORAL | Status: AC
  Administered 2018-12-17: 1000 mg via ORAL
  Filled 2018-12-17: qty 2

## 2018-12-17 MED ORDER — PREDNISONE 20 MG PO TABS: 40.0000 mg | ORAL_TABLET | Freq: Every day | ORAL | 0 refills | Status: AC

## 2018-12-17 MED ORDER — GADOBUTROL 1 MMOL/ML IV SOLN
8.0000 mL | Freq: Once | INTRAVENOUS | Status: AC | PRN
  Administered 2018-12-17: 8 mL via INTRAVENOUS

## 2018-12-17 MED ORDER — PREDNISONE 20 MG PO TABS
60.0000 mg | ORAL_TABLET | Freq: Once | ORAL | Status: AC
  Administered 2018-12-17: 60 mg via ORAL
  Filled 2018-12-17: qty 3

## 2018-12-17 NOTE — ED Triage Notes (Addendum)
Patient presents to ED c/o R eye and mouth asymmetry compared to L side of face (onset upon waking at 8-8:30am today) as well as shortness of breath and L-sided chest tightness x 2 months, intermittent. Patient states she's seen her cardiologist and rheumatologist recently and had an echo and some blood work done, as well as a COVID test but is currently waiting on the results. Resp e/u, skin w/d. Patient tearful in triage - concerned that something is very wrong. Reassured patient we are starting her workup here in triage to get her care started until a room opens up. Patient A&O x 4, no speech changes, no new vision changes, no extremity weakness. LKW last night about 9pm?

## 2018-12-17 NOTE — ED Provider Notes (Signed)
I saw and evaluated the patient, reviewed the resident's note and I agree with the findings and plan.  EKG: EKG Interpretation  Date/Time:  Wednesday December 17 2018 15:29:15 EDT Ventricular Rate:  102 PR Interval:  142 QRS Duration: 92 QT Interval:  362 QTC Calculation: 471 R Axis:   66 Text Interpretation:  Sinus tachycardia with occasional Premature ventricular complexes Anteroseptal infarct , age undetermined Abnormal ECG agree, otherwise normal. no sig change from old except pvcs Confirmed by Charlesetta Shanks [phone]) on 12/17/2018 6:09:57 PM Patient has left-sided  Right-sided facial droop and weakness that she started to notice this morning.  Reports fluid leaked out of her mouth on the left side.  Her left eye felt a little unusual.  As the day went on it became more pronounced.  Patient did not have any other weakness numbness or tingling.  No gait incoordination.  Patient does note that for several months now she has had intermittent episodes of vertigo.  She reports at one point she had a very intense episode with associated vomiting.  She reports since then she does not get quite that symptomatic but in certain positions will get intense spinning quality particularly with her head to the left.  She does however also note that she gets episodes where she will suddenly feel lightheaded and then have double vision that pretty quickly goes back to normal.  That has happened at this week.  She is not had double vision in association with this episode today.  She does however note a numbness sensation that goes around much of the mouth and does not stay isolated to the left side.  Patient is alert with normal mental status.  He has facial droop on the right side with decreased rate of eye closure on the right relative to the left.  Also more activity of the brow on the left.  Patient however is able to elevate the brow on the right as well.  Anger nose exam intact.  Heel shin exam intact.  Motor  strength 5\54 extremities.  Cognitive function and speech are normal.  Patient presents with a appearance suggestive of Bell's palsy however she does have ability to elevate the brow on the right albeit less than on the left.  She also perceives numbness that is on both sides of her mouth.  Patient gives additional history of episodes of double vision over the past several weeks.  At this time will consult neurology for what appears atypical for uncomplicated Bell's palsy.  Diagnostic work-up to this point is unremarkable.   Charlesetta Shanks, MD 12/17/18 [phone]

## 2018-12-17 NOTE — ED Notes (Signed)
Pt transported to MRI 

## 2018-12-17 NOTE — ED Notes (Signed)
Patient Alert and oriented to baseline. Stable and ambulatory to baseline. Patient verbalized understanding of the discharge instructions.  Patient belongings were taken by the patient.   

## 2018-12-17 NOTE — ED Provider Notes (Signed)
[facility] EMERGENCY DEPARTMENT Provider Note   CSN: [id number] Arrival date & time: 12/17/18  1517    History   Chief Complaint Chief Complaint  Patient presents with  . Stroke Symptoms  . Shortness of Breath    HPI Whitney [name] is a 63 y.o. female with history of type 2 diabetes, NASH cirrhosis, Sjogren's syndrome, and fibromyalgia presents to the ED complaining of right sided facial droop and left lower lip numbness since around 8:30 AM this morning.  She reports that when she tried to eat breakfast this morning she had water run out of the left side of her mouth due to the numbness.  She reports feeling like she just went to the dentist and had a nerve block.  She also reports that her right eye feels different than her left eye but has difficulty describing what this means.  She also endorses intermittent periods of diplopia with associated lightheadedness over the past week.  Patient also reports a sensation of vertigo over the past few months mostly when she is lying in bed and rolls over.  She denies any significant headache, focal weakness, chest pain, shortness of breath, cough, abdominal pain, nausea, vomiting, diarrhea, or any other complaints.     The history is provided by the patient.    Past Medical History:  Diagnosis Date  . Allergic rhinitis   . Anemia    hx  . Arthritis   . Asthma    hx yrs ago  . Cirrhosis ([place]) last albumin 3.3 done at [facility] 06-16-2014 (under care everywhere tab in epic)   Secondary to Fatty liver --  followed by hepatology at [facility] (dr Gerald Dexter)   . Depression   . Diabetes mellitus type II    type 2 diet conrolled  . Dyspnea   . Fibromyalgia   . GERD (gastroesophageal reflux disease)   . Heart murmur    asymptomatic ---  1989 from rhuematic fever  . History of exercise stress test    05-05-2013---  negative bruce ETT given exercise workload,  no ischemia  . History of hiatal hernia   . History of kidney stones   .  History of rheumatic fever    1989  . Hyperlipidemia   . Leukocytopenia   . Moderate aortic stenosis    AVA area 1.1cm2---  cardiologist --  dr Concepcion Living, 2014 in epic  . NASH (nonalcoholic steatohepatitis)   . OSA (obstructive sleep apnea)    was using CPAP before gastric sleeve 2015--  no uses after wt loss  . Pneumonia    hx  . Sjogren's syndrome ([place])   . Thrombocytopenia [facility])     Patient Active Problem List   Diagnosis Date Noted  . Paroxysmal atrial fibrillation ([place]) 01/24/2018  . S/P AVR 12/05/2017  . Severe aortic stenosis   . Inguinal hernia 10/22/2016  . Lower extremity edema 06/22/2015  . Aortic valve disorder 04/21/2013  . Edema 04/21/2013  . Depression   . OSA (obstructive sleep apnea)   . Sjogren's syndrome ([place])   . Cirrhosis ([place])   . Hyperlipidemia   . Fibromyalgia   . Postmenopausal bleeding 12/11/2012  . Endometrial polyp 12/11/2012  . Chronic cough 04/11/2011    Past Surgical History:  Procedure Laterality Date  . AORTIC VALVE REPLACEMENT N/A 12/05/2017   Procedure: AORTIC VALVE REPLACEMENT (AVR) TISSUE VALVE 21MM INSPIRIS;  Surgeon: Gaye Pollack, MD;  Location: [facility];  Service: Open Heart Surgery;  Laterality:  N/A;  . COLONOSCOPY WITH ESOPHAGOGASTRODUODENOSCOPY (EGD)    . CYSTOSCOPY WITH RETROGRADE PYELOGRAM, URETEROSCOPY AND STENT PLACEMENT Left 01/21/2015   Procedure: CYSTOSCOPY WITH LEFT  RETROGRADE PYELOGRAM, LEFT URETEROSCOPY AND STENT PLACEMENT;  Surgeon: Festus Aloe, MD;  Location[facility];  Service: Urology;  Laterality: Left;  . CYSTOSCOPY WITH RETROGRADE PYELOGRAM, URETEROSCOPY AND STENT PLACEMENT Bilateral 02/24/2015   Procedure: CYSTOSCOPY WITH RIGHT RETROGRADE PYELOGRAM, BLADDER BIOPSY FULGERATION LEFT URETEROSCOPY AND STENT REPLACEMENT;  Surgeon: Festus Aloe, MD;  Location: [facility];  Service: Urology;  Laterality: Bilateral;  . EXPLORATORY LAPAROSCOPY W/  CONE BIOPSY'S LEFT AND RIGHT  LOBE OF LIVER  11-04-2007  . HOLMIUM LASER APPLICATION Left 49/17/9150   Procedure: HOLMIUM LASER LITHOTRIPSY;  Surgeon: Festus Aloe, MD;  Location: [facility];  Service: Urology;  Laterality: Left;  . HYSTEROSCOPY W/D&C N/A 12/11/2012   Procedure: DILATATION AND CURETTAGE /HYSTEROSCOPY;  Surgeon: Maeola Sarah. Landry Mellow, MD;  Location: [facility];  Service: Gynecology;  Laterality: N/A;  . INGUINAL HERNIA REPAIR Left 10/22/2016   Procedure: LEFT INGUINAL HERNIA REPAIR;  Surgeon: Rolm Bookbinder, MD;  Location: [facility];  Service: General;  Laterality: Left;  TAP BLOCK  . INSERTION OF MESH Left 10/22/2016   Procedure: INSERTION OF MESH;  Surgeon: Rolm Bookbinder, MD;  Location: [place];  Service: General;  Laterality: Left;  . LAPAROSCOPIC GASTRIC SLEEVE RESECTION  07-27-2013  . PUBOVAGINAL SLING  04-10-2001   [place]  . RIGHT/LEFT HEART CATH AND CORONARY ANGIOGRAPHY N/A 08/23/2017   Procedure: RIGHT/LEFT HEART CATH AND CORONARY ANGIOGRAPHY;  Surgeon: [name], MD;  Location: [facility];  Service: Cardiovascular;  Laterality: N/A;  . TEE WITHOUT CARDIOVERSION N/A 12/05/2017   Procedure: TRANSESOPHAGEAL ECHOCARDIOGRAM (TEE);  Surgeon: Gaye Pollack, MD;  Location: [place];  Service: Open Heart Surgery;  Laterality: N/A;  . TONSILLECTOMY  1975  . TRANSTHORACIC ECHOCARDIOGRAM  06-04-2012  dr Irish Lack   mild LVH,  grade I diastolic dysfunction/  ef 60-65%/  moderate LAE/  mild MV calcifation without stenosis,  mild MR/  moderate AV stenosis,  cannot r/o bicupsid, area 1.1cm2/  mild dilated aortic root/  trivial TR     OB History   No obstetric history on file.      Home Medications    Prior to Admission medications   Medication Sig Start Date End Date Taking? Authorizing Provider  aspirin EC 81 MG EC tablet Take 1 tablet (81 mg total) by mouth daily. 12/11/17   Nani Skillern, PA-C  ezetimibe (ZETIA) 10 MG tablet Take 10 mg by mouth at bedtime.  09/17/16   [provider]  OVER THE COUNTER MEDICATION Take 1 packet by mouth 2 (two) times daily. Bariatric Multivitamin    [provider]  predniSONE (DELTASONE) 20 MG tablet Take 2 tablets (40 mg total) by mouth daily for 6 days. 12/18/18 12/24/18  Candie Chroman, MD  valACYclovir (VALTREX) 1000 MG tablet Take 1 tablet (1,000 mg total) by mouth 3 (three) times daily for 7 days. 12/18/18 12/25/18  Candie Chroman, MD    Family History Family History  Problem Relation Age of Onset  . Alzheimer's disease Father   . Hip fracture Father   . Asthma Father   . Heart disease Father   . Heart attack Father   . Hypertension Father   . Rheum arthritis Mother   . Heart disease Mother   . Allergies Daughter   . Asthma Paternal Grandmother   .  Asthma Daughter   . Stroke Neg Hx     Social History Social History   Tobacco Use  . Smoking status: Never Smoker  . Smokeless tobacco: Never Used  Substance Use Topics  . Alcohol use: Yes    Comment: rarely  . Drug use: No     Allergies   Doxycycline and Naproxen   Review of Systems Review of Systems  Constitutional: Negative for chills and fever.  HENT: Negative for ear pain and sore throat.   Eyes: Positive for visual disturbance. Negative for pain.  Respiratory: Negative for cough and shortness of breath.   Cardiovascular: Negative for chest pain and palpitations.  Gastrointestinal: Negative for abdominal pain, diarrhea, nausea and vomiting.  Genitourinary: Negative for dysuria and hematuria.  Musculoskeletal: Negative for arthralgias and back pain.  Skin: Negative for color change and rash.  Neurological: Positive for dizziness, facial asymmetry, light-headedness and numbness. Negative for seizures, syncope, speech difficulty and headaches.  Psychiatric/Behavioral: Negative for agitation and behavioral problems.  All other systems reviewed and are negative.    Physical Exam Updated Vital Signs BP 132/82   Pulse 79   Temp 98.5 F  (36.9 C) (Oral)   Resp 18   SpO2 95%   Physical Exam Vitals signs and nursing note reviewed.  Constitutional:      General: She is not in acute distress.    Appearance: Normal appearance. She is normal weight. She is not ill-appearing, toxic-appearing or diaphoretic.  HENT:     Head: Normocephalic and atraumatic.     Nose: Nose normal. No congestion or rhinorrhea.     Mouth/Throat:     Mouth: Mucous membranes are moist.     Pharynx: Oropharynx is clear. No oropharyngeal exudate or posterior oropharyngeal erythema.  Eyes:     Extraocular Movements: Extraocular movements intact.     Conjunctiva/sclera: Conjunctivae normal.     Pupils: Pupils are equal, round, and reactive to light.  Cardiovascular:     Rate and Rhythm: Normal rate and regular rhythm.     Pulses: Normal pulses.     Heart sounds: Normal heart sounds. No murmur. No friction rub. No gallop.   Pulmonary:     Effort: Pulmonary effort is normal. No respiratory distress.     Breath sounds: Normal breath sounds. No stridor. No wheezing.  Abdominal:     General: Abdomen is flat. There is no distension.     Palpations: Abdomen is soft.     Tenderness: There is no abdominal tenderness. There is no guarding or rebound.  Musculoskeletal: Normal range of motion.        General: No swelling, tenderness, deformity or signs of injury.  Skin:    General: Skin is warm and dry.  Neurological:     Mental Status: She is alert and oriented to person, place, and time.     Cranial Nerves: Facial asymmetry (right sided facial droop) present.     Coordination: Coordination normal. Finger-Nose-Finger Test and Heel to [place] Test normal.     Comments: Patient has droop to the right corner of her mouth which somewhat involves the right eyebrow at rest, however patient is able to move the eyebrow when asked to do so.  Patient reports decreased sensation to the left lower lip.  Psychiatric:        Mood and Affect: Mood normal.        Behavior:  Behavior normal.      ED Treatments / Results  Labs (all labs ordered  are listed, but only abnormal results are displayed) Labs Reviewed  APTT - Abnormal; Notable for the following components:      Result Value   aPTT 37 (*)    All other components within normal limits  CBC - Abnormal; Notable for the following components:   WBC 3.6 (*)    RBC 5.22 (*)    HCT 46.1 (*)    RDW 15.8 (*)    Platelets 79 (*)    All other components within normal limits  COMPREHENSIVE METABOLIC PANEL - Abnormal; Notable for the following components:   Albumin 3.3 (*)    Total Bilirubin 1.4 (*)    All other components within normal limits  I-STAT CHEM 8, ED - Abnormal; Notable for the following components:   Hemoglobin 16.0 (*)    HCT 47.0 (*)    All other components within normal limits  PROTIME-INR  DIFFERENTIAL  CBG MONITORING, ED    EKG EKG Interpretation  Date/Time:  Wednesday December 17 2018 15:29:15 EDT Ventricular Rate:  102 PR Interval:  142 QRS Duration: 92 QT Interval:  362 QTC Calculation: 471 R Axis:   66 Text Interpretation:  Sinus tachycardia with occasional Premature ventricular complexes Anteroseptal infarct , age undetermined Abnormal ECG agree, otherwise normal. no sig change from old except pvcs Confirmed by Charlesetta Shanks [phone]) on 12/17/2018 6:09:57 PM   Radiology Dg Chest 2 View  Result Date: 12/17/2018 CLINICAL DATA:  Shortness of breath EXAM: CHEST - 2 VIEW COMPARISON:  01/08/2018 FINDINGS: Post sternotomy changes and valvular prosthesis. Mild elevation of right diaphragm, new since 2019. No focal opacity or pleural effusion. Normal heart size. No pneumothorax. IMPRESSION: No acute airspace disease.  New elevation of the right diaphragm. Electronically Signed   By: Donavan Foil M.D.   On: 12/17/2018 16:27   Ct Head Wo Contrast  Result Date: 12/17/2018 CLINICAL DATA:  Insert clear headfocal neuro deficit, > 6 hrs, stroke suspected Possible stroke EXAM: CT HEAD  WITHOUT CONTRAST TECHNIQUE: Contiguous axial images were obtained from the base of the skull through the vertex without intravenous contrast. COMPARISON:  None. FINDINGS: Brain: No acute intracranial hemorrhage. No focal mass lesion. No CT evidence of acute infarction. No midline shift or mass effect. No hydrocephalus. Basilar cisterns are patent. Vascular: No hyperdense vessel or unexpected calcification. Skull: Normal. Negative for fracture or focal lesion. Sinuses/Orbits: Mild scattered opacification ethmoid air cells. Dense smooth ossific lesion in the LEFT ethmoid air cells measuring 6 mm (image 27/4). Other: None. IMPRESSION: 1. No acute intracranial findings. No CT evidence of cortical infarction. 2. Mild sinus inflammation and benign ossification LEFT ethmoid sinus most consistent with benign osteoma. Electronically Signed   By: Suzy Bouchard M.D.   On: 12/17/2018 17:06   Mr Brain W And Wo Contrast  Result Date: 12/17/2018 CLINICAL DATA:  New onset facial droop and numbness. Question brainstem stroke. Intermittent episodes of vertigo over the last several months. EXAM: MRI HEAD WITHOUT AND WITH CONTRAST TECHNIQUE: Multiplanar, multiecho pulse sequences of the brain and surrounding structures were obtained without and with intravenous contrast. CONTRAST:  8 ML Gadavist COMPARISON:  CT head without contrast 12/17/2018. MRI brain 09/29/2009 FINDINGS: Brain: The diffusion-weighted images, including thin slice images through the brainstem demonstrate no focal areas of restricted diffusion to suggest acute or subacute infarct. No acute infarct, hemorrhage, or mass lesion is present. No significant white matter lesions are present. The ventricles are of normal size. No significant extraaxial fluid collection is present. The brainstem and  cerebellum are within normal limits. Vascular: Flow is present in the major intracranial arteries. The globes and orbits are within normal limits. Skull and upper cervical  spine: The craniocervical junction is normal. Upper cervical spine is within normal limits. Marrow signal is unremarkable. Sinuses/Orbits: Minimal mucosal thickening is present in the ethmoid air cells. The paranasal sinuses and mastoid air cells are otherwise clear. The globes and orbits are within normal limits. IMPRESSION: 1. No acute or subacute infarct. 2. Normal MRI appearance of the brainstem including thin slice diffusion-weighted imaging. 3. Minimal sinus disease. Electronically Signed   By: San Morelle M.D.   On: 12/17/2018 20:31    Procedures Procedures (including critical care time)  Medications Ordered in ED Medications  sodium chloride flush (NS) 0.9 % injection 3 mL (has no administration in time range)  gadobutrol (GADAVIST) 1 MMOL/ML injection 8 mL (8 mLs Intravenous Contrast Given 12/17/18 2014)  predniSONE (DELTASONE) tablet 60 mg (60 mg Oral Given 12/17/18 2105)  valACYclovir (VALTREX) tablet 1,000 mg (1,000 mg Oral Given 12/17/18 2105)     Initial Impression / Assessment and Plan / ED Course  I have reviewed the triage vital signs and the nursing notes.  Pertinent labs & imaging results that were available during my care of the patient were reviewed by me and considered in my medical decision making (see chart for details).        ADYLENE DLUGOSZ is a 63 y.o. female with history of type 2 diabetes, NASH cirrhosis, Sjogren's syndrome, and fibromyalgia presents to the ED complaining of right sided facial droop and left lower lip numbness since around 8:30 AM this morning.  On exam, patient has facial droop on the right side that affects both the upper and lower face.  CMP and CBC were unremarkable.  CT head showed no acute intracranial abnormalities.  Discussed case with neurology, who recommended MRI brain with and without contrast with thin brainstem slices to rule out possible brainstem stroke.  MRI was negative.  Patient's presentation is consistent with Bell's  palsy.  Patient was initiated on prednisone and valacyclovir in the ED and was discharged home with prescriptions for the same.  Patient was advised to follow-up closely with her primary care physician and to return to the ED for any worsening of her symptoms.   Final Clinical Impressions(s) / ED Diagnoses   Final diagnoses:  Bell's palsy    ED Discharge Orders         Ordered    predniSONE (DELTASONE) 20 MG tablet  Daily     12/17/18 2043    valACYclovir (VALTREX) 1000 MG tablet  3 times daily     12/17/18 2043           Candie Chroman, MD 12/18/18 [place]    Charlesetta Shanks, MD 12/27/18 2220

## 2018-12-17 NOTE — ED Notes (Signed)
Pt remains in MRI 

## 2018-12-19 ENCOUNTER — Other Ambulatory Visit: Payer: Self-pay

## 2018-12-19 ENCOUNTER — Ambulatory Visit (HOSPITAL_COMMUNITY)
Admission: RE | Admit: 2018-12-19 | Discharge: 2018-12-19 | Disposition: A | Discharge status: Home / Self Care | Payer: Medicare Other | Source: Ambulatory Visit | Attending: Interventional Cardiology | Admitting: Interventional Cardiology

## 2018-12-19 DIAGNOSIS — R0602 Shortness of breath: Secondary | ICD-10-CM | POA: Diagnosis present

## 2018-12-19 LAB — PULMONARY FUNCTION TEST
DL/VA % pred: 126 %
DL/VA: 5.28 ml/min/mmHg/L
DLCO cor % pred: 71 %
DLCO cor: 14.14 ml/min/mmHg
DLCO unc % pred: 74 %
DLCO unc: 14.76 ml/min/mmHg
FEF 25-75 Post: 1.92 L/sec
FEF 25-75 Pre: 1.2 L/sec
FEF2575-%Change-Post: 59 %
FEF2575-%Pred-Post: 89 %
FEF2575-%Pred-Pre: 56 %
FEV1-%Change-Post: 13 %
FEV1-%Pred-Post: 52 %
FEV1-%Pred-Pre: 46 %
FEV1-Post: 1.28 L
FEV1-Pre: 1.13 L
FEV1FVC-%Change-Post: 3 %
FEV1FVC-%Pred-Pre: 108 %
FEV6-%Change-Post: 8 %
FEV6-%Pred-Post: 48 %
FEV6-%Pred-Pre: 44 %
FEV6-Post: 1.47 L
FEV6-Pre: 1.35 L
FEV6FVC-%Pred-Post: 104 %
FEV6FVC-%Pred-Pre: 104 %
FVC-%Change-Post: 9 %
FVC-%Pred-Post: 46 %
FVC-%Pred-Pre: 42 %
FVC-Post: 1.47 L
FVC-Pre: 1.35 L
Post FEV1/FVC ratio: 87 %
Post FEV6/FVC ratio: 100 %
Pre FEV1/FVC ratio: 84 %
Pre FEV6/FVC Ratio: 100 %
RV % pred: 78 %
RV: 1.63 L
TLC % pred: 64 %
TLC: 3.24 L

## 2018-12-19 MED ORDER — ALBUTEROL SULFATE (2.5 MG/3ML) 0.083% IN NEBU
2.5000 mg | INHALATION_SOLUTION | Freq: Once | RESPIRATORY_TRACT | Status: AC
  Administered 2018-12-19: 2.5 mg via RESPIRATORY_TRACT

## 2018-12-22 ENCOUNTER — Telehealth: Payer: Self-pay | Admitting: Interventional Cardiology

## 2018-12-22 DIAGNOSIS — R942 Abnormal results of pulmonary function studies: Secondary | ICD-10-CM

## 2018-12-22 DIAGNOSIS — R42 Dizziness and giddiness: Secondary | ICD-10-CM

## 2018-12-22 DIAGNOSIS — R0602 Shortness of breath: Secondary | ICD-10-CM

## 2018-12-22 DIAGNOSIS — Z953 Presence of xenogenic heart valve: Secondary | ICD-10-CM

## 2018-12-22 DIAGNOSIS — R5383 Other fatigue: Secondary | ICD-10-CM

## 2018-12-22 NOTE — Telephone Encounter (Signed)
Spoke with patient who states she is feeling poor; states she has not felt this bad in 20 year. C/o dizziness, light-headedness; not sleeping well  Hx NASH and Sjogren's  Taking prednisone since last Wednesday for Bell's Palsy which she knows is contributing to her not sleeping well States she is fatigued, gets SOB easily, cramping below left shoulder that feels like "catches" Wants to make certain her symptoms are not heart related. I reviewed PFT results with her and entered order for pulmonology referral. I answered questions to her satisfaction and offered advice to follow good hydration and nutrition as she admits she is not eating enough protein and has gained 30 lb since Covid restrictions came into place. I advised her that I will forward message to Dr. Irish Lack for advice and for his interpretation of her ECG done in ED with previous ECGs. I advised we will call her back with his advice. She verbalized understanding and agreement and thanked me for the call.

## 2018-12-22 NOTE — Telephone Encounter (Signed)
New Message   Patient calling in to ensure that her mychart message sent on 12/21/18 was received. Patient states that she just needs some of the test that she has had and the results to be went over with her. States that this is important because she still feels poorly and wants to understand why. Please give patient a call back.

## 2018-12-22 NOTE — Telephone Encounter (Signed)
Left message for patient to call back PFTs have been resulted

## 2018-12-23 NOTE — Telephone Encounter (Signed)
Called and made patient aware of information below. Echo ordered and scheduled for 8/21 at 1:45 PM. Patient aware to be here at 1:30 PM.

## 2018-12-23 NOTE — Telephone Encounter (Signed)
Called patient and made her aware of information below. Echo ordered and scheduled for 12/26/18 at 1:45 PM. Patient aware to arrive at 1:30 PM.

## 2018-12-23 NOTE — Telephone Encounter (Signed)
Whitney Booze, MD to Me  11:07 AM I reviewed her results from the ER visit. ECG change may be from different lead placement. However, since her sx persist, let's repeat an echo to check her heart function and aortic valve function.   THanks.  JV

## 2018-12-26 ENCOUNTER — Other Ambulatory Visit: Payer: Self-pay

## 2018-12-26 ENCOUNTER — Ambulatory Visit (HOSPITAL_COMMUNITY): Payer: Medicare Other | Attending: Cardiovascular Disease

## 2018-12-26 DIAGNOSIS — R42 Dizziness and giddiness: Secondary | ICD-10-CM | POA: Diagnosis present

## 2018-12-26 DIAGNOSIS — R5383 Other fatigue: Secondary | ICD-10-CM | POA: Diagnosis present

## 2018-12-26 DIAGNOSIS — Z953 Presence of xenogenic heart valve: Secondary | ICD-10-CM | POA: Insufficient documentation

## 2018-12-30 ENCOUNTER — Telehealth: Payer: Self-pay | Admitting: Interventional Cardiology

## 2018-12-30 NOTE — Telephone Encounter (Signed)
New message      Called to give pt [facility] appointment time and date with Dr Lamonte Sakai.  Patient want to talk to the nurse about her PFT test.  Please call.

## 2018-12-30 NOTE — Telephone Encounter (Signed)
Called and spoke to patient.

## 2018-12-30 NOTE — Telephone Encounter (Signed)
Spoke with patient and she will keep appt with Dr. Lamonte Sakai at [facility] on 9/1

## 2019-01-06 ENCOUNTER — Encounter: Payer: Self-pay | Admitting: Emergency Medicine

## 2019-01-06 ENCOUNTER — Other Ambulatory Visit: Payer: Self-pay

## 2019-01-06 ENCOUNTER — Ambulatory Visit: Payer: Medicare Other | Admitting: Emergency Medicine

## 2019-01-06 VITALS — BP 130/88 | HR 96 | Ht 64.0 in | Wt 222.0 lb

## 2019-01-06 DIAGNOSIS — G4733 Obstructive sleep apnea (adult) (pediatric): Secondary | ICD-10-CM | POA: Diagnosis not present

## 2019-01-06 DIAGNOSIS — Z23 Encounter for immunization: Secondary | ICD-10-CM

## 2019-01-06 DIAGNOSIS — J986 Disorders of diaphragm: Secondary | ICD-10-CM

## 2019-01-06 DIAGNOSIS — R0602 Shortness of breath: Secondary | ICD-10-CM

## 2019-01-06 DIAGNOSIS — R0600 Dyspnea, unspecified: Secondary | ICD-10-CM | POA: Insufficient documentation

## 2019-01-06 MED ORDER — ALBUTEROL SULFATE HFA 108 (90 BASE) MCG/ACT IN AERS: 2.0000 | INHALATION_SPRAY | RESPIRATORY_TRACT | 5 refills | Status: DC | PRN

## 2019-01-06 NOTE — Assessment & Plan Note (Signed)
Formally on CPAP before significant weight loss.  Not currently.  We could consider a repeat study going forward if suspicion for clinically significant OSA exist.

## 2019-01-06 NOTE — Assessment & Plan Note (Addendum)
Multifactorial shortness of breath.  Her cardiac status post-AVR appears to be stable / improved. Pulmonary function testing shows evidence for restriction, possible superimposed obstruction.  Suspect that in part her restriction is due to elevated right hemidiaphragm.  Question cause but consider paralysis versus elevation due to her underlying liver disease.  Also of note, 30 pounds weight gain over the last several months which likely contributes.    She has symptoms consistent with upper airway obstruction, "throat noise" without overt stridor or wheeze.  She does have some cough and PND.  Consider also lower airways obstruction given her pulmonary function testing.  I think based on this it is reasonable to do a therapeutic trial of albuterol to see if she gets benefit.  We will arrange for a sniff test to assess your diaphragm movement Please try using albuterol 2 puffs if needed for shortness of breath. You can also try using this about 10 minutes prior to exertion to see if you benefit.  Follow with Dr Lamonte Sakai in 1 month

## 2019-01-06 NOTE — Patient Instructions (Signed)
We will arrange for a sniff test to assess your diaphragm movement Please try using albuterol 2 puffs if needed for shortness of breath. You can also try using this about 10 minutes prior to exertion to see if you benefit.  Follow with Dr Lamonte Sakai in 1 month

## 2019-01-06 NOTE — Progress Notes (Signed)
Subjective:    Patient ID: Whitney [name], female    DOB: [birth year], 63 y.o.   MRN: [id number]  HPI 63 year old never smoker with a history of obesity status post gastric sleeve and significant weight loss (as much as 100 pounds; has gained back 30 lbs this yr), obstructive sleep apnea (no longer on CPAP), NASH cirrhosis and chronic thrombocytopenia, aortic stenosis with a tissue AVR on 12/05/2017, diabetes, Sjogren's syndrome.  Last seen here in our office by Dr. Gwenette Greet 2013 for chronic cough felt related to LPR and rhinitis.  She had a recent ED visit this month for apparent Bell's palsy.  Chest x-ray that day 12/17/2018 reviewed, shows no evidence for infiltrate but a new elevation of her right hemidiaphragm.   She is referred today for exertional SOB. She started to notice it following her AVR. Seemed to be worst when bending. Then she recovered a bit around 05/2018. Since the beginning of the year she has put on about 30 lbs. She has had progressive dyspnea with exertion, occasional throat rattle, no real wheeze or stridor. Minimal cough although she does have some PND, throat dryness. She can walk indefinitely, but is SOB when doing it. Able to shop.   Pulmonary function testing done 12/19/2018 reviewed by me shows significant restriction with possible superimposed obstruction, borderline bronchodilator response, decreased diffusion capacity that corrects to the normal range when adjusted for alveolar volume.  Echocardiogram 12/26/2018 shows normal LV function, mild aortic insufficiency with a prosthetic valve  Remote methacholine challenge 1997 without any evidence of hyperresponsiveness.   Review of Systems  Constitutional: Negative for fever and unexpected weight change.  HENT: Negative for congestion, dental problem, ear pain, nosebleeds, postnasal drip, rhinorrhea, sinus pressure, sneezing, sore throat and trouble swallowing.   Eyes: Negative for redness and itching.  Respiratory: Positive  for shortness of breath and wheezing. Negative for cough and chest tightness.   Cardiovascular: Negative for palpitations and leg swelling.  Gastrointestinal: Negative for nausea and vomiting.  Genitourinary: Negative for dysuria.  Musculoskeletal: Negative for joint swelling.  Skin: Negative for rash.  Neurological: Negative for headaches.  Hematological: Does not bruise/bleed easily.  Psychiatric/Behavioral: Negative for dysphoric mood. The patient is not nervous/anxious.     Past Medical History:  Diagnosis Date  . Allergic rhinitis   . Anemia    hx  . Arthritis   . Asthma    hx yrs ago  . Cirrhosis ([place]) last albumin 3.3 done at [facility] 06-16-2014 (under care everywhere tab in epic)   Secondary to Fatty liver --  followed by hepatology at [facility] (dr Gerald Dexter)   . Depression   . Diabetes mellitus type II    type 2 diet conrolled  . Dyspnea   . Fibromyalgia   . GERD (gastroesophageal reflux disease)   . Heart murmur    asymptomatic ---  1989 from rhuematic fever  . History of exercise stress test    05-05-2013---  negative bruce ETT given exercise workload,  no ischemia  . History of hiatal hernia   . History of kidney stones   . History of rheumatic fever    1989  . Hyperlipidemia   . Leukocytopenia   . Moderate aortic stenosis    AVA area 1.1cm2---  cardiologist --  dr Concepcion Living, 2014 in epic  . NASH (nonalcoholic steatohepatitis)   . OSA (obstructive sleep apnea)    was using CPAP before gastric sleeve 2015--  no uses after wt loss  . Pneumonia  hx  . Sjogren's syndrome ([place])   . Thrombocytopenia ([place])      Family History  Problem Relation Age of Onset  . Alzheimer's disease Father   . Hip fracture Father   . Asthma Father   . Heart disease Father   . Heart attack Father   . Hypertension Father   . Rheum arthritis Mother   . Heart disease Mother   . Allergies Daughter   . Asthma Paternal Grandmother   . Asthma Daughter   . Stroke Neg Hx      Social  History   Socioeconomic History  . Marital status: Married    Spouse name: Married to [employer]  . Number of children: Not on file  . Years of education: Not on file  . Highest education level: Not on file  Occupational History  . Occupation: principal of [employer]  Social Needs  . Financial resource strain: Not on file  . Food insecurity    Worry: Not on file    Inability: Not on file  . Transportation needs    Medical: Not on file    Non-medical: Not on file  Tobacco Use  . Smoking status: Never Smoker  . Smokeless tobacco: Never Used  Substance and Sexual Activity  . Alcohol use: Yes    Comment: rarely  . Drug use: No  . Sexual activity: Not on file  Lifestyle  . Physical activity    Days per week: Not on file    Minutes per session: Not on file  . Stress: Not on file  Relationships  . Social Herbalist on phone: Not on file    Gets together: Not on file    Attends religious service: Not on file    Active member of club or organization: Not on file    Attends meetings of clubs or organizations: Not on file    Relationship status: Not on file  . Intimate partner violence    Fear of current or ex partner: Not on file    Emotionally abused: Not on file    Physically abused: Not on file    Forced sexual activity: Not on file  Other Topics Concern  . Not on file  Social History Narrative  . Not on file     Allergies  Allergen Reactions  . Doxycycline Hives and Rash  . Naproxen Rash and Hives     Outpatient Medications Prior to Visit  Medication Sig Dispense Refill  . aspirin EC 81 MG EC tablet Take 1 tablet (81 mg total) by mouth daily.    Marland Kitchen ezetimibe (ZETIA) 10 MG tablet Take 10 mg by mouth at bedtime.   11  . OVER THE COUNTER MEDICATION Take 1 packet by mouth 2 (two) times daily. Bariatric Multivitamin     No facility-administered medications prior to visit.         Objective:   Physical Exam Vitals:   01/06/19 1428  BP: 130/88   Pulse: 96  SpO2: 97%  Weight: 222 lb (100.7 kg)  Height: 5' 4"  (1.626 m)   Gen: Pleasant, overwt woman, in no distress,  normal affect  ENT: No lesions,  mouth clear,  oropharynx clear, no postnasal drip  Neck: No JVD, no stridor  Lungs: No use of accessory muscles, no crackles or wheezing on normal respiration, no wheeze on forced expiration  Cardiovascular: RRR, systolic M with intact S1 and S2  Musculoskeletal: No deformities, no cyanosis or clubbing  Neuro: alert, awake, non focal  Skin: Warm, no lesions or rash      Assessment & Plan:  OSA (obstructive sleep apnea) Formally on CPAP before significant weight loss.  Not currently.  We could consider a repeat study going forward if suspicion for clinically significant OSA exist.  Dyspnea Multifactorial shortness of breath.  Her cardiac status post-AVR appears to be stable / improved. Pulmonary function testing shows evidence for restriction, possible superimposed obstruction.  Suspect that in part her restriction is due to elevated right hemidiaphragm.  Question cause but consider paralysis versus elevation due to her underlying liver disease.  Also of note, 30 pounds weight gain over the last several months which likely contributes.    She has symptoms consistent with upper airway obstruction, "throat noise" without overt stridor or wheeze.  She does have some cough and PND.  Consider also lower airways obstruction given her pulmonary function testing.  I think based on this it is reasonable to do a therapeutic trial of albuterol to see if she gets benefit.  We will arrange for a sniff test to assess your diaphragm movement Please try using albuterol 2 puffs if needed for shortness of breath. You can also try using this about 10 minutes prior to exertion to see if you benefit.  Follow with Dr Lamonte Sakai in 1 month  Baltazar Apo, MD, PhD 01/06/2019, 5:07 PM [facility] [phone] or if no answer  [phone]

## 2019-01-09 ENCOUNTER — Other Ambulatory Visit: Payer: Self-pay

## 2019-01-09 ENCOUNTER — Ambulatory Visit (HOSPITAL_COMMUNITY): Payer: Medicare Other

## 2019-01-09 ENCOUNTER — Ambulatory Visit (HOSPITAL_COMMUNITY)
Admission: RE | Admit: 2019-01-09 | Discharge: 2019-01-09 | Disposition: A | Discharge status: Home / Self Care | Payer: Medicare Other | Source: Ambulatory Visit | Attending: Emergency Medicine | Admitting: Emergency Medicine

## 2019-01-09 DIAGNOSIS — J986 Disorders of diaphragm: Secondary | ICD-10-CM | POA: Diagnosis not present

## 2019-02-09 ENCOUNTER — Ambulatory Visit: Payer: Medicare Other | Admitting: Emergency Medicine

## 2019-02-12 ENCOUNTER — Ambulatory Visit: Payer: Medicare Other | Admitting: Emergency Medicine

## 2019-02-12 ENCOUNTER — Other Ambulatory Visit: Payer: Self-pay

## 2019-02-12 ENCOUNTER — Encounter: Payer: Self-pay | Admitting: Emergency Medicine

## 2019-02-12 VITALS — BP 120/72 | HR 99 | Temp 97.5°F | Ht 63.0 in | Wt 223.0 lb

## 2019-02-12 DIAGNOSIS — J986 Disorders of diaphragm: Secondary | ICD-10-CM | POA: Diagnosis not present

## 2019-02-12 NOTE — Patient Instructions (Signed)
You can continue to keep albuterol available to use 2 puffs up to every 4 hours if needed for shortness of breath. You can use 2 puffs before exertion to help with your exercise tolerance.  We will need to work on improving your cardiopulmonary conditioning. We will refer you to Pulmonary Rehab.  Follow with Dr Lamonte Sakai in 6 months or sooner if you have any problems

## 2019-02-12 NOTE — Progress Notes (Signed)
Subjective:    Patient ID: Whitney [name], female    DOB: Dec 07, 1955, 63 y.o.   MRN: [id number]  HPI 63 year old never smoker with a history of obesity status post gastric sleeve and significant weight loss (as much as 100 pounds; has gained back 30 lbs this yr), obstructive sleep apnea (no longer on CPAP), NASH cirrhosis and chronic thrombocytopenia, aortic stenosis with a tissue AVR on 12/05/2017, diabetes, Sjogren's syndrome.  Last seen here in our office by Dr. Gwenette Greet 2013 for chronic cough felt related to LPR and rhinitis.  She had a recent ED visit this month for apparent Bell's palsy.  Chest x-ray that day 12/17/2018 reviewed, shows no evidence for infiltrate but a new elevation of her right hemidiaphragm.   She is referred today for exertional SOB. She started to notice it following her AVR. Seemed to be worst when bending. Then she recovered a bit around 05/2018. Since the beginning of the year she has put on about 30 lbs. She has had progressive dyspnea with exertion, occasional throat rattle, no real wheeze or stridor. Minimal cough although she does have some PND, throat dryness. She can walk indefinitely, but is SOB when doing it. Able to shop.   Pulmonary function testing done 12/19/2018 reviewed by me shows significant restriction with possible superimposed obstruction, borderline bronchodilator response, decreased diffusion capacity that corrects to the normal range when adjusted for alveolar volume.  Echocardiogram 12/26/2018 shows normal LV function, mild aortic insufficiency with a prosthetic valve  Remote methacholine challenge 1997 without any evidence of hyperresponsiveness.  ROV 02/12/2019 --follow-up visit for 63 year old woman with obesity, OSA (no longer on CPAP), NASH cirrhosis, aortic stenosis post tissue AVR just over 1 year ago, Sjogren's syndrome.  She has been seen here in the past for cough but I saw her a month ago for exertional shortness of breath.  She has had about 30  pounds weight gain over the interval and question whether that may be a contributor.  PFT 12/19/2018 with principally restriction as above.  She still has some upper airway noise and occasional cough, PND.  I asked her to do a therapeutic trial of albuterol to see if she would get benefit.  She reports that she hasn't really noticed any large scale change - may be a bit less SOB with speak.   A sniff test was done on 01/09/2019 that shows mild to moderate elevation of the right hemidiaphragm with severe hemiparesis/near hemiplegia of the right hemidiaphragm than during deep breathing, cough and sniff maneuver.   Review of Systems  Constitutional: Negative for fever and unexpected weight change.  HENT: Negative for congestion, dental problem, ear pain, nosebleeds, postnasal drip, rhinorrhea, sinus pressure, sneezing, sore throat and trouble swallowing.   Eyes: Negative for redness and itching.  Respiratory: Positive for shortness of breath and wheezing. Negative for cough and chest tightness.   Cardiovascular: Negative for palpitations and leg swelling.  Gastrointestinal: Negative for nausea and vomiting.  Genitourinary: Negative for dysuria.  Musculoskeletal: Negative for joint swelling.  Skin: Negative for rash.  Neurological: Negative for headaches.  Hematological: Does not bruise/bleed easily.  Psychiatric/Behavioral: Negative for dysphoric mood. The patient is not nervous/anxious.     Past Medical History:  Diagnosis Date  . Allergic rhinitis   . Anemia    hx  . Arthritis   . Asthma    hx yrs ago  . Cirrhosis ([place]) last albumin 3.3 done at [facility] 06-16-2014 (under care everywhere tab in epic)  Secondary to Fatty liver --  followed by hepatology at [place] (dr Gerald Dexter)   . Depression   . Diabetes mellitus type II    type 2 diet conrolled  . Dyspnea   . Fibromyalgia   . GERD (gastroesophageal reflux disease)   . Heart murmur    asymptomatic ---  1989 from rhuematic fever  . History of  exercise stress test    05-05-2013---  negative bruce ETT given exercise workload,  no ischemia  . History of hiatal hernia   . History of kidney stones   . History of rheumatic fever    1989  . Hyperlipidemia   . Leukocytopenia   . Moderate aortic stenosis    AVA area 1.1cm2---  cardiologist --  dr Concepcion Living, 2014 in epic  . NASH (nonalcoholic steatohepatitis)   . OSA (obstructive sleep apnea)    was using CPAP before gastric sleeve 2015--  no uses after wt loss  . Pneumonia    hx  . Sjogren's syndrome ([place])   . Thrombocytopenia ([place])      Family History  Problem Relation Age of Onset  . Alzheimer's disease Father   . Hip fracture Father   . Asthma Father   . Heart disease Father   . Heart attack Father   . Hypertension Father   . Rheum arthritis Mother   . Heart disease Mother   . Allergies Daughter   . Asthma Paternal Grandmother   . Asthma Daughter   . Stroke Neg Hx      Social History   Socioeconomic History  . Marital status: Married    Spouse name: Married to [employer]  . Number of children: Not on file  . Years of education: Not on file  . Highest education level: Not on file  Occupational History  . Occupation: principal of [employer]  Social Needs  . Financial resource strain: Not on file  . Food insecurity    Worry: Not on file    Inability: Not on file  . Transportation needs    Medical: Not on file    Non-medical: Not on file  Tobacco Use  . Smoking status: Never Smoker  . Smokeless tobacco: Never Used  Substance and Sexual Activity  . Alcohol use: Yes    Comment: rarely  . Drug use: No  . Sexual activity: Not on file  Lifestyle  . Physical activity    Days per week: Not on file    Minutes per session: Not on file  . Stress: Not on file  Relationships  . Social Herbalist on phone: Not on file    Gets together: Not on file    Attends religious service: Not on file    Active member of club or organization: Not on file     Attends meetings of clubs or organizations: Not on file    Relationship status: Not on file  . Intimate partner violence    Fear of current or ex partner: Not on file    Emotionally abused: Not on file    Physically abused: Not on file    Forced sexual activity: Not on file  Other Topics Concern  . Not on file  Social History Narrative  . Not on file     Allergies  Allergen Reactions  . Doxycycline Hives and Rash  . Naproxen Rash and Hives     Outpatient Medications Prior to Visit  Medication Sig Dispense Refill  .  albuterol (VENTOLIN HFA) 108 (90 Base) MCG/ACT inhaler Inhale 2 puffs into the lungs every 4 (four) hours as needed for wheezing or shortness of breath. 18 g 5  . aspirin EC 81 MG EC tablet Take 1 tablet (81 mg total) by mouth daily.    Marland Kitchen ezetimibe (ZETIA) 10 MG tablet Take 10 mg by mouth at bedtime.   11  . OVER THE COUNTER MEDICATION Take 1 packet by mouth 2 (two) times daily. Bariatric Multivitamin     No facility-administered medications prior to visit.         Objective:   Physical Exam Vitals:   02/12/19 1405  BP: 120/72  Pulse: 99  Temp: (!) 97.5 F (36.4 C)  TempSrc: Oral  SpO2: 93%  Weight: 223 lb (101.2 kg)  Height: 5' 3"  (1.6 m)   Gen: Pleasant, overwt woman, in no distress,  normal affect  ENT: No lesions,  mouth clear,  oropharynx clear, no postnasal drip  Neck: No JVD, no stridor  Lungs: No use of accessory muscles, clear, better BS on the L than on the R  Cardiovascular: RRR, systolic M with intact S1 and S2  Musculoskeletal: No deformities, no cyanosis or clubbing  Neuro: alert, awake, non focal  Skin: Warm, no lesions or rash      Assessment & Plan:  Dyspnea Unchanged since last visit.  Her weight is stable.  Suspect that this is multifactorial but significant contribution from her right hemidiaphragm paralysis (question from her thoracic surgery?),  Weight gain, deconditioning.  Minimal response to albuterol which  corresponds with absence of significant obstruction on her pulmonary function testing.  All the same I think is okay for her to use albuterol as needed, may help her prior to exertion.  Getting her back in optimal physical condition is likely the best strategy here.  I will refer her to pulmonary rehab  Baltazar Apo, MD, PhD 02/12/2019, 2:24 PM [facility] [phone] or if no answer [phone]

## 2019-02-12 NOTE — Assessment & Plan Note (Signed)
Unchanged since last visit.  Her weight is stable.  Suspect that this is multifactorial but significant contribution from her right hemidiaphragm paralysis (question from her thoracic surgery?),  Weight gain, deconditioning.  Minimal response to albuterol which corresponds with absence of significant obstruction on her pulmonary function testing.  All the same I think is okay for her to use albuterol as needed, may help her prior to exertion.  Getting her back in optimal physical condition is likely the best strategy here.  I will refer her to pulmonary rehab

## 2019-02-13 ENCOUNTER — Telehealth (HOSPITAL_COMMUNITY): Payer: Self-pay

## 2019-02-13 NOTE — Telephone Encounter (Signed)
Pt insurance is active and benefits verified through [facility] [employer]. Co-pay $20.00, DED $0.00/$0.00 met, out of pocket $3,300.00/$320.00 met, co-insurance 0%. No pre-authorization required. Kim/[facility], 02/13/2019 @ 1:45PM, DFG#[id number]

## 2019-02-16 ENCOUNTER — Encounter (HOSPITAL_COMMUNITY): Payer: Self-pay | Admitting: *Deleted

## 2019-02-16 NOTE — Progress Notes (Signed)
Received referral from Dr. Lamonte Sakai for this pt who is known by the Cardiac Rehab staff.  Pt completed 25 exercise session cardiac rehab in 2019 s/p AVR. Pt referred to pulmonary rehab with the diagnosis of J98.6 (ICD-10-CM) - Elevated hemidiaphragm. Clinical review of pt follow up appt on 10/8 with Dry Byrum  Pulmonary office note. Pt with sniff test on 9/4 which showed "mild-to-moderate elevation of the right hemidiaphragm at rest. There is normal muscular activity of the left hemidiaphragm. There is asymmetric severe hemiparesis/near-hemiplegia of the right hemidiaphragm during deep breathing, cough and sniff maneuvers, with episodes of slight paradoxical elevation of right hemidiaphragm during vigorous sniff maneuvers."   Pt appropriate for scheduling for Pulmonary rehab.  Will forward to pulmonary rehab  for scheduling. Pt has Covid Risk Score -3  [place] [employer] operational officer, BSN Cardiac and Training and development officer

## 2019-02-17 ENCOUNTER — Telehealth (HOSPITAL_COMMUNITY): Payer: Self-pay

## 2019-03-02 ENCOUNTER — Telehealth (HOSPITAL_COMMUNITY): Payer: Self-pay

## 2019-03-02 ENCOUNTER — Telehealth (HOSPITAL_COMMUNITY): Payer: Self-pay | Admitting: *Deleted

## 2019-03-02 NOTE — Telephone Encounter (Signed)
Called to reschedule pulmonary rehab walk test/orientation, LMTCB.

## 2019-03-03 ENCOUNTER — Telehealth (HOSPITAL_COMMUNITY): Payer: Self-pay

## 2019-03-05 ENCOUNTER — Telehealth (HOSPITAL_COMMUNITY): Payer: Self-pay | Admitting: *Deleted

## 2019-03-05 NOTE — Telephone Encounter (Signed)
Called patient's daughter, Bryson Ha to have her mother call pulmonary rehab to reschedule her walk-test/orientation.

## 2019-03-09 ENCOUNTER — Ambulatory Visit (HOSPITAL_COMMUNITY): Payer: Medicare Other

## 2019-03-09 ENCOUNTER — Telehealth (HOSPITAL_COMMUNITY): Payer: Self-pay

## 2019-03-11 ENCOUNTER — Other Ambulatory Visit: Payer: Self-pay

## 2019-03-11 ENCOUNTER — Encounter (HOSPITAL_COMMUNITY)
Admission: RE | Admit: 2019-03-11 | Discharge: 2019-03-11 | Disposition: A | Discharge status: Home / Self Care | Payer: Medicare Other | Source: Ambulatory Visit | Attending: Emergency Medicine | Admitting: Emergency Medicine

## 2019-03-11 ENCOUNTER — Encounter (HOSPITAL_COMMUNITY): Payer: Self-pay

## 2019-03-11 VITALS — BP 129/83 | HR 62 | Temp 98.2°F | Resp 14 | Ht 64.0 in | Wt 225.8 lb

## 2019-03-11 DIAGNOSIS — J986 Disorders of diaphragm: Secondary | ICD-10-CM

## 2019-03-11 NOTE — Progress Notes (Signed)
Whitney [name] 63 y.o. female Pulmonary Rehab Orientation Note "Whitney [name]" who is known to cardiac rehab staff from previous participation in 2019 arrived today in Cardiac and Pulmonary Rehab for orientation/walk test to Pulmonary Rehab. Pt referred to pulmonary rehab by Dr. Lamonte Sakai with the diagnosis of Elevated Hemidiaphragm. She arrived late ambulatory from the main entrance.  Pt was teary eyed and anxious about being short of breath and running late.  Pt placed in a chair and reassured. Pt used her inhaler with some relief.  She  does not carry portable oxygen. Per pt, she uses oxygen never. Color good, skin warm and dry. Patient is oriented to time and place. Patient's medical history, psychosocial health, and medications reviewed. Psychosocial assessment reveals pt lives with their son. Pt is currently retired principal. Pt hobbies include reading. Pt reports her stress level is low. Areas of stress/anxiety include Health and family.  Pt displays anxiety and stress regarding her shortness of breath which makes her more anxious and fearful.  Pt son who lives with her has major depression. Although he is receiving both medication and counseling she does worry about his well being. Pt exhibits signs of depression. Signs of depression include anxiety, crying spells and panic and difficulty falling asleep and faitfue. PHQ2/9 score 2/4. Pt shows fair  coping skills with mostly positive outlook . Whitney [name] offered emotional support and reassurance. Will continue to monitor and evaluate progress toward psychosocial goal(s) of coping skills to use so that she doesn't get anxious with shortness of breath. Physical assessment reveals heart rate is normal, breath sounds clear to auscultation, no wheezes, rales, or rhonchi however diminished througout. Grip strength equal, strong. Distal pulses palpable. Patient reports she does take medications as prescribed. Patient states she follows a Low fat diet. The patient reports no specific  efforts to gain or lose weight. Pt had gastric sleeve placed and initially lost 135 pounds.  She has since gain 35 pounds back particularly during Covid pandemic.  Patient's weight will be monitored closely. Demonstration and practice of PLB using pulse oximeter (pt does not have a pulse ox). Patient able to return demonstration satisfactorily. Safety and hand hygiene in the exercise area reviewed with patient. Patient voices understanding of the information reviewed. Department expectations discussed with patient and achievable goals were set. The patient shows enthusiasm about attending the program and we look forward to working with this nice lady. The patient is scheduled to begin exercise on 11/10 at 10:00.  Cherre Huger, BSN Cardiac and Training and development officer

## 2019-03-12 NOTE — Progress Notes (Signed)
Pulmonary Individual Treatment Plan  Patient Details  Name: RAYLENE CARMICKLE MRN: [id number] Date of Birth: [birth year] Referring Provider:     Pulmonary Rehab Walk Test from 03/11/2019 in [place]  Referring Provider  Dr. Lamonte Sakai      Initial Encounter Date:    Pulmonary Rehab Walk Test from 03/11/2019 in [place]  Date  03/11/19      Visit Diagnosis: Elevated hemidiaphragm  Patient's Home Medications on Admission:   Current Outpatient Medications:  .  albuterol (VENTOLIN HFA) 108 (90 Base) MCG/ACT inhaler, Inhale 2 puffs into the lungs every 4 (four) hours as needed for wheezing or shortness of breath., Disp: 18 g, Rfl: 5 .  aspirin EC 81 MG EC tablet, Take 1 tablet (81 mg total) by mouth daily., Disp: , Rfl:  .  ezetimibe (ZETIA) 10 MG tablet, Take 10 mg by mouth at bedtime. , Disp: , Rfl: 11 .  OVER THE COUNTER MEDICATION, Take 1 packet by mouth 2 (two) times daily. Bariatric Multivitamin, Disp: , Rfl:   Past Medical History: Past Medical History:  Diagnosis Date  . Allergic rhinitis   . Anemia    hx  . Arthritis   . Asthma    hx yrs ago  . Cirrhosis ([place]) last albumin 3.3 done at [facility] 06-16-2014 (under care everywhere tab in epic)   Secondary to Fatty liver --  followed by hepatology at [facility] (dr Gerald Dexter)   . Depression   . Diabetes mellitus type II    type 2 diet conrolled  . Dyspnea   . Fibromyalgia   . GERD (gastroesophageal reflux disease)   . Heart murmur    asymptomatic ---  1989 from rhuematic fever  . History of exercise stress test    05-05-2013---  negative bruce ETT given exercise workload,  no ischemia  . History of hiatal hernia   . History of kidney stones   . History of rheumatic fever    1989  . Hyperlipidemia   . Leukocytopenia   . Moderate aortic stenosis    AVA area 1.1cm2---  cardiologist --  dr Concepcion Living, 2014 in epic  . NASH (nonalcoholic steatohepatitis)   . OSA  (obstructive sleep apnea)    was using CPAP before gastric sleeve 2015--  no uses after wt loss  . Pneumonia    hx  . Sjogren's syndrome ([place])   . Thrombocytopenia ([place])     Tobacco Use: Social History   Tobacco Use  Smoking Status Never Smoker  Smokeless Tobacco Never Used    Labs: Recent Review Flowsheet Data    Labs for ITP Cardiac and Pulmonary Rehab Latest Ref Rng & Units 12/05/2017 12/05/2017 12/05/2017 12/05/2017 12/17/2018   Hemoglobin A1c 4.8 - 5.6 % - - - - -   PHART 7.350 - 7.450 7.430 7.349(L) - 7.326(L) -   PCO2ART 32.0 - 48.0 mmHg 38.9 41.7 - 43.5 -   HCO3 20.0 - 28.0 mmol/L 25.8 23.2 - 22.8 -   TCO2 22 - 32 mmol/L 27 25 23 24 27    ACIDBASEDEF 0.0 - 2.0 mmol/L - 3.0(H) - 3.0(H) -   O2SAT % 100.0 97.0 - 97.0 -      Capillary Blood Glucose: Lab Results  Component Value Date   GLUCAP 77 12/17/2018   GLUCAP 79 02/12/2018   GLUCAP 100 (H) 12/12/2017   GLUCAP 166 (H) 12/11/2017   GLUCAP 161 (H) 12/11/2017     Pulmonary Assessment  Scores: Pulmonary Assessment Scores    Row Name 03/11/19 1153 03/12/19 0701       ADL UCSD   SOB Score total  44  -      CAT Score   CAT Score  16  -      mMRC Score   mMRC Score  -  2      UCSD: Self-administered rating of dyspnea associated with activities of daily living (ADLs) 6-point scale (0 = "not at all" to 5 = "maximal or unable to do because of breathlessness")  Scoring Scores range from 0 to 120.  Minimally important difference is 5 units  CAT: CAT can identify the health impairment of COPD patients and is better correlated with disease progression.  CAT has a scoring range of zero to 40. The CAT score is classified into four groups of low (less than 10), medium (10 - 20), high (21-30) and very high (31-40) based on the impact level of disease on health status. A CAT score over 10 suggests significant symptoms.  A worsening CAT score could be explained by an exacerbation, poor medication adherence, poor inhaler  technique, or progression of COPD or comorbid conditions.  CAT MCID is 2 points  mMRC: mMRC (Modified Medical Research Council) Dyspnea Scale is used to assess the degree of baseline functional disability in patients of respiratory disease due to dyspnea. No minimal important difference is established. A decrease in score of 1 point or greater is considered a positive change.   Pulmonary Function Assessment: Pulmonary Function Assessment - 03/11/19 1336      Breath   Bilateral Breath Sounds  Decreased;Clear    Shortness of Breath  Fear of Shortness of Breath;Limiting activity;Panic with Shortness of Breath       Exercise Target Goals: Exercise Program Goal: Individual exercise prescription set using results from initial 6 min walk test and THRR while considering  patient's activity barriers and safety.   Exercise Prescription Goal: Initial exercise prescription builds to 30-45 minutes a day of aerobic activity, 2-3 days per week.  Home exercise guidelines will be given to patient during program as part of exercise prescription that the participant will acknowledge.  Activity Barriers & Risk Stratification: Activity Barriers & Cardiac Risk Stratification - 03/11/19 1334      Activity Barriers & Cardiac Risk Stratification   Activity Barriers  Other (comment);Arthritis;Shortness of Breath;Fibromyalgia    Comments  sjogren syndrome    Cardiac Risk Stratification  Moderate       6 Minute Walk: 6 Minute Walk    Row Name 03/11/19 1230         6 Minute Walk   Phase  Initial     Distance  1185 feet     Walk Time  6 minutes     # of Rest Breaks  0     MPH  2.24     METS  2.64     RPE  12     Perceived Dyspnea   2     VO2 Peak  9.25     Symptoms  No     Resting HR  81 bpm     Resting BP  126/62     Resting Oxygen Saturation   96 %     Exercise Oxygen Saturation  during 6 min walk  88 %     Max Ex. HR  113 bpm     Max Ex. BP  154/88     2 Minute Post  BP  126/80        Interval HR   1 Minute HR  99     2 Minute HR  102     3 Minute HR  102     4 Minute HR  105     5 Minute HR  108     6 Minute HR  113     2 Minute Post HR  87     Interval Heart Rate?  Yes       Interval Oxygen   Interval Oxygen?  Yes     Baseline Oxygen Saturation %  96 %     1 Minute Oxygen Saturation %  92 %     1 Minute Liters of Oxygen  0 L     2 Minute Oxygen Saturation %  88 %     2 Minute Liters of Oxygen  0 L     3 Minute Oxygen Saturation %  89 %     3 Minute Liters of Oxygen  0 L     4 Minute Oxygen Saturation %  91 %     4 Minute Liters of Oxygen  0 L     5 Minute Oxygen Saturation %  89 %     5 Minute Liters of Oxygen  0 L     6 Minute Oxygen Saturation %  89 %     6 Minute Liters of Oxygen  0 L     2 Minute Post Oxygen Saturation %  95 %     2 Minute Post Liters of Oxygen  0 L        Oxygen Initial Assessment: Oxygen Initial Assessment - 03/12/19 0700      Home Oxygen   Home Oxygen Device  None    Sleep Oxygen Prescription  None    Home Exercise Oxygen Prescription  None    Home at Rest Exercise Oxygen Prescription  None      Initial 6 min Walk   Oxygen Used  None      Program Oxygen Prescription   Program Oxygen Prescription  None      Intervention   Short Term Goals  To learn and exhibit compliance with exercise, home and travel O2 prescription;To learn and understand importance of monitoring SPO2 with pulse oximeter and demonstrate accurate use of the pulse oximeter.;To learn and understand importance of maintaining oxygen saturations>88%;To learn and demonstrate proper pursed lip breathing techniques or other breathing techniques.;To learn and demonstrate proper use of respiratory medications    Long  Term Goals  Exhibits compliance with exercise, home and travel O2 prescription;Verbalizes importance of monitoring SPO2 with pulse oximeter and return demonstration;Maintenance of O2 saturations>88%;Exhibits proper breathing techniques, such as pursed  lip breathing or other method taught during program session;Compliance with respiratory medication       Oxygen Re-Evaluation:   Oxygen Discharge (Final Oxygen Re-Evaluation):   Initial Exercise Prescription: Initial Exercise Prescription - 03/12/19 0700      Date of Initial Exercise RX and Referring Provider   Date  03/11/19    Referring Provider  Dr. Lamonte Sakai      Recumbant Bike   Level  1    Minutes  15      NuStep   Level  2    SPM  80    Minutes  15      Prescription Details   Frequency (times per week)  2    Duration  Progress to  30 minutes of continuous aerobic without signs/symptoms of physical distress      Intensity   THRR 40-80% of Max Heartrate  62-124    Ratings of Perceived Exertion  11-13    Perceived Dyspnea  0-4      Progression   Progression  Continue progressive overload as per policy without signs/symptoms or physical distress.      Resistance Training   Training Prescription  Yes    Weight  orange bands    Reps  10-15       Perform Capillary Blood Glucose checks as needed.  Exercise Prescription Changes:   Exercise Comments:   Exercise Goals and Review: Exercise Goals    Row Name 03/11/19 1332 03/12/19 0705           Exercise Goals   Increase Physical Activity  Yes  Yes      Intervention  Provide advice, education, support and counseling about physical activity/exercise needs.;Develop an individualized exercise prescription for aerobic and resistive training based on initial evaluation findings, risk stratification, comorbidities and participant's personal goals.  Provide advice, education, support and counseling about physical activity/exercise needs.;Develop an individualized exercise prescription for aerobic and resistive training based on initial evaluation findings, risk stratification, comorbidities and participant's personal goals.      Expected Outcomes  Long Term: Exercising regularly at least 3-5 days a week.;Long Term: Add in  home exercise to make exercise part of routine and to increase amount of physical activity.;Short Term: Attend rehab on a regular basis to increase amount of physical activity.  Long Term: Exercising regularly at least 3-5 days a week.;Long Term: Add in home exercise to make exercise part of routine and to increase amount of physical activity.;Short Term: Attend rehab on a regular basis to increase amount of physical activity.      Increase Strength and Stamina  Yes  Yes      Intervention  Provide advice, education, support and counseling about physical activity/exercise needs.;Develop an individualized exercise prescription for aerobic and resistive training based on initial evaluation findings, risk stratification, comorbidities and participant's personal goals.  Provide advice, education, support and counseling about physical activity/exercise needs.;Develop an individualized exercise prescription for aerobic and resistive training based on initial evaluation findings, risk stratification, comorbidities and participant's personal goals.      Expected Outcomes  Short Term: Increase workloads from initial exercise prescription for resistance, speed, and METs.;Short Term: Perform resistance training exercises routinely during rehab and add in resistance training at home;Long Term: Improve cardiorespiratory fitness, muscular endurance and strength as measured by increased METs and functional capacity (6MWT)  Short Term: Increase workloads from initial exercise prescription for resistance, speed, and METs.;Short Term: Perform resistance training exercises routinely during rehab and add in resistance training at home;Long Term: Improve cardiorespiratory fitness, muscular endurance and strength as measured by increased METs and functional capacity (6MWT)      Able to understand and use rate of perceived exertion (RPE) scale  Yes  Yes      Intervention  Provide education and explanation on how to use RPE scale   Provide education and explanation on how to use RPE scale      Expected Outcomes  Short Term: Able to use RPE daily in rehab to express subjective intensity level;Long Term:  Able to use RPE to guide intensity level when exercising independently  Short Term: Able to use RPE daily in rehab to express subjective intensity level;Long Term:  Able to use RPE to guide intensity level  when exercising independently      Able to understand and use Dyspnea scale  Yes  Yes      Intervention  Provide education and explanation on how to use Dyspnea scale  Provide education and explanation on how to use Dyspnea scale      Expected Outcomes  Short Term: Able to use Dyspnea scale daily in rehab to express subjective sense of shortness of breath during exertion;Long Term: Able to use Dyspnea scale to guide intensity level when exercising independently  Short Term: Able to use Dyspnea scale daily in rehab to express subjective sense of shortness of breath during exertion;Long Term: Able to use Dyspnea scale to guide intensity level when exercising independently      Knowledge and understanding of Target Heart Rate Range (THRR)  Yes  Yes      Intervention  Provide education and explanation of THRR including how the numbers were predicted and where they are located for reference  Provide education and explanation of THRR including how the numbers were predicted and where they are located for reference      Expected Outcomes  Short Term: Able to state/look up THRR;Long Term: Able to use THRR to govern intensity when exercising independently;Short Term: Able to use daily as guideline for intensity in rehab  Short Term: Able to state/look up THRR;Long Term: Able to use THRR to govern intensity when exercising independently;Short Term: Able to use daily as guideline for intensity in rehab      Understanding of Exercise Prescription  Yes  Yes      Intervention  Provide education, explanation, and written materials on patient's  individual exercise prescription  Provide education, explanation, and written materials on patient's individual exercise prescription      Expected Outcomes  Short Term: Able to explain program exercise prescription;Long Term: Able to explain home exercise prescription to exercise independently  Short Term: Able to explain program exercise prescription;Long Term: Able to explain home exercise prescription to exercise independently         Exercise Goals Re-Evaluation :   Discharge Exercise Prescription (Final Exercise Prescription Changes):   Nutrition:  Target Goals: Understanding of nutrition guidelines, daily intake of sodium <1563m, cholesterol <2057m calories 30% from fat and 7% or less from saturated fats, daily to have 5 or more servings of fruits and vegetables.  Biometrics: Pre Biometrics - 03/11/19 1130      Pre Biometrics   Grip Strength  30 kg        Nutrition Therapy Plan and Nutrition Goals:   Nutrition Assessments:   Nutrition Goals Re-Evaluation:   Nutrition Goals Discharge (Final Nutrition Goals Re-Evaluation):   Psychosocial: Target Goals: Acknowledge presence or absence of significant depression and/or stress, maximize coping skills, provide positive support system. Participant is able to verbalize types and ability to use techniques and skills needed for reducing stress and depression.  Initial Review & Psychosocial Screening: Initial Psych Review & Screening - 03/11/19 1154      Initial Review   Current issues with  None Identified   every day stress.  Son who has major depression lives with patient     [facility] Yes      Barriers   Psychosocial barriers to participate in program  The patient should benefit from training in stress management and relaxation.      Screening Interventions   Interventions  Encouraged to exercise;Provide feedback about the scores to participant  Quality of Life  Scores:  Scores of 19 and below usually indicate a poorer quality of life in these areas.  A difference of  2-3 points is a clinically meaningful difference.  A difference of 2-3 points in the total score of the Quality of Life Index has been associated with significant improvement in overall quality of life, self-image, physical symptoms, and general health in studies assessing change in quality of life.  PHQ-9: Recent Review Flowsheet Data    Depression screen [facility] 2/9 03/11/2019 03/11/2019 04/28/2018 02/12/2018   Decreased Interest 1 1 0 0   Down, Depressed, Hopeless 1 1 0 0   PHQ - 2 Score 2 2 0 0   Altered sleeping 1 1 - -   Tired, decreased energy 1 1 - -   Change in appetite 0 0 - -   Feeling bad or failure about yourself  0 0 - -   Trouble concentrating 0 0 - -   Moving slowly or fidgety/restless 0 0 - -   Suicidal thoughts 0 0 - -   PHQ-9 Score 4 4 - -   Difficult doing work/chores Not difficult at all - - -     Interpretation of Total Score  Total Score Depression Severity:  1-4 = Minimal depression, 5-9 = Mild depression, 10-14 = Moderate depression, 15-19 = Moderately severe depression, 20-27 = Severe depression   Psychosocial Evaluation and Intervention:   Psychosocial Re-Evaluation:   Psychosocial Discharge (Final Psychosocial Re-Evaluation):   Education: Education Goals: Education classes will be provided on a weekly basis, covering required topics. Participant will state understanding/return demonstration of topics presented.  Learning Barriers/Preferences: Learning Barriers/Preferences - 03/11/19 1156      Learning Barriers/Preferences   Learning Barriers  Sight    Learning Preferences  Skilled Demonstration;Written Material;Verbal Instruction       Education Topics: Risk Factor Reduction:  -Group instruction that is supported by a PowerPoint presentation. Instructor discusses the definition of a risk factor, different risk factors for pulmonary disease,  and how the heart and lungs work together.     Nutrition for Pulmonary Patient:  -Group instruction provided by PowerPoint slides, verbal discussion, and written materials to support subject matter. The instructor gives an explanation and review of healthy diet recommendations, which includes a discussion on weight management, recommendations for fruit and vegetable consumption, as well as protein, fluid, caffeine, fiber, sodium, sugar, and alcohol. Tips for eating when patients are short of breath are discussed.   Pursed Lip Breathing:  -Group instruction that is supported by demonstration and informational handouts. Instructor discusses the benefits of pursed lip and diaphragmatic breathing and detailed demonstration on how to preform both.     Oxygen Safety:  -Group instruction provided by PowerPoint, verbal discussion, and written material to support subject matter. There is an overview of "What is Oxygen" and "Why do we need it".  Instructor also reviews how to create a safe environment for oxygen use, the importance of using oxygen as prescribed, and the risks of noncompliance. There is a brief discussion on traveling with oxygen and resources the patient may utilize.   Oxygen Equipment:  -Group instruction provided by [facility] Staff utilizing handouts, written materials, and equipment demonstrations.   Signs and Symptoms:  -Group instruction provided by written material and verbal discussion to support subject matter. Warning signs and symptoms of infection, stroke, and heart attack are reviewed and when to call the physician/911 reinforced. Tips for preventing the spread of infection discussed.  Advanced Directives:  -Group instruction provided by verbal instruction and written material to support subject matter. Instructor reviews Advanced Directive laws and proper instruction for filling out document.   Pulmonary Video:  -Group video education that reviews the importance of  medication and oxygen compliance, exercise, good nutrition, pulmonary hygiene, and pursed lip and diaphragmatic breathing for the pulmonary patient.   Exercise for the Pulmonary Patient:  -Group instruction that is supported by a PowerPoint presentation. Instructor discusses benefits of exercise, core components of exercise, frequency, duration, and intensity of an exercise routine, importance of utilizing pulse oximetry during exercise, safety while exercising, and options of places to exercise outside of rehab.     Pulmonary Medications:  -Verbally interactive group education provided by instructor with focus on inhaled medications and proper administration.   Anatomy and Physiology of the Respiratory System and Intimacy:  -Group instruction provided by PowerPoint, verbal discussion, and written material to support subject matter. Instructor reviews respiratory cycle and anatomical components of the respiratory system and their functions. Instructor also reviews differences in obstructive and restrictive respiratory diseases with examples of each. Intimacy, Sex, and Sexuality differences are reviewed with a discussion on how relationships can change when diagnosed with pulmonary disease. Common sexual concerns are reviewed.   MD DAY -A group question and answer session with a medical doctor that allows participants to ask questions that relate to their pulmonary disease state.   OTHER EDUCATION -Group or individual verbal, written, or video instructions that support the educational goals of the pulmonary rehab program.   Holiday Eating Survival Tips:  -Group instruction provided by PowerPoint slides, verbal discussion, and written materials to support subject matter. The instructor gives patients tips, tricks, and techniques to help them not only survive but enjoy the holidays despite the onslaught of food that accompanies the holidays.   Knowledge Questionnaire Score: Knowledge  Questionnaire Score - 03/11/19 1200      Knowledge Questionnaire Score   Pre Score  2/18       Core Components/Risk Factors/Patient Goals at Admission: Personal Goals and Risk Factors at Admission - 03/11/19 1157      Core Components/Risk Factors/Patient Goals on Admission    Weight Management  Obesity;Weight Loss    Improve shortness of breath with ADL's  Yes    Intervention  Provide education, individualized exercise plan and daily activity instruction to help decrease symptoms of SOB with activities of daily living.    Expected Outcomes  Short Term: Improve cardiorespiratory fitness to achieve a reduction of symptoms when performing ADLs;Long Term: Be able to perform more ADLs without symptoms or delay the onset of symptoms    Lipids  Yes    Intervention  Provide education and support for participant on nutrition & aerobic/resistive exercise along with prescribed medications to achieve LDL <76m, HDL >442m    Expected Outcomes  Short Term: Participant states understanding of desired cholesterol values and is compliant with medications prescribed. Participant is following exercise prescription and nutrition guidelines.;Long Term: Cholesterol controlled with medications as prescribed, with individualized exercise RX and with personalized nutrition plan. Value goals: LDL < 7011mHDL > 40 mg.       Core Components/Risk Factors/Patient Goals Review:    Core Components/Risk Factors/Patient Goals at Discharge (Final Review):    ITP Comments:   Comments:

## 2019-03-17 ENCOUNTER — Encounter (HOSPITAL_COMMUNITY)
Admission: RE | Admit: 2019-03-17 | Discharge: 2019-03-17 | Disposition: A | Discharge status: Home / Self Care | Payer: Medicare Other | Source: Ambulatory Visit | Attending: Emergency Medicine | Admitting: Emergency Medicine

## 2019-03-17 ENCOUNTER — Other Ambulatory Visit: Payer: Self-pay

## 2019-03-17 DIAGNOSIS — J986 Disorders of diaphragm: Secondary | ICD-10-CM | POA: Diagnosis not present

## 2019-03-17 NOTE — Progress Notes (Signed)
Daily Session Note  Patient Details  Name: Whitney [name] MRN: [id number] Date of Birth: 1955/10/08 Referring Provider:     Pulmonary Rehab Walk Test from 03/11/2019 in [place]  Referring Provider  Dr. Lamonte Sakai      Encounter Date: 03/17/2019  Check In: Session Check In - 03/17/19 1132      Check-In   Supervising physician immediately available to respond to emergencies  [facility] immediately available    Physician(s)  Dr. Tawanna Solo    Location  [facility]    Staff Present  Rosebud Poles, RN, Bjorn Loser, MS, Exercise Physiologist;Lisa Ysidro Evert, RN    Virtual Visit  No    Medication changes reported      No    Fall or balance concerns reported     No    Tobacco Cessation  No Change    Warm-up and Cool-down  Performed on first and last piece of equipment    Resistance Training Performed  Yes    VAD Patient?  No    PAD/SET Patient?  No      Pain Assessment   Currently in Pain?  No/denies    Pain Score  0-No pain    Multiple Pain Sites  No       Capillary Blood Glucose: No results found for this or any previous visit (from the past 24 hour(s)).    Social History   Tobacco Use  Smoking Status Never Smoker  Smokeless Tobacco Never Used    Goals Met:  Independence with exercise equipment Exercise tolerated well Strength training completed today  Goals Unmet:  Not Applicable  Comments: Service time is from 1005 to 1106    Dr. Rush Farmer is Medical Director for Pulmonary Rehab at [facility].

## 2019-03-19 ENCOUNTER — Encounter (HOSPITAL_COMMUNITY)
Admission: RE | Admit: 2019-03-19 | Discharge: 2019-03-19 | Disposition: A | Discharge status: Home / Self Care | Payer: Medicare Other | Source: Ambulatory Visit | Attending: Emergency Medicine | Admitting: Emergency Medicine

## 2019-03-19 ENCOUNTER — Other Ambulatory Visit: Payer: Self-pay

## 2019-03-19 DIAGNOSIS — J986 Disorders of diaphragm: Secondary | ICD-10-CM | POA: Diagnosis not present

## 2019-03-19 NOTE — Progress Notes (Signed)
Daily Session Note  Patient Details  Name: Whitney [name] MRN: [id number] Date of Birth: [birth year] Referring Provider:     Pulmonary Rehab Walk Test from 03/11/2019 in [place]  Referring Provider  Dr. Lamonte Sakai      Encounter Date: 03/19/2019  Check In: Session Check In - 03/19/19 1111      Check-In   Supervising physician immediately available to respond to emergencies  [facility] immediately available    Physician(s)  Dr. Denton Brick    Location  [facility]    Staff Present  Rosebud Poles, RN, Bjorn Loser, MS, Exercise Physiologist;Lisa Ysidro Evert, RN    Virtual Visit  No    Medication changes reported      No    Fall or balance concerns reported     No    Tobacco Cessation  No Change    Warm-up and Cool-down  Performed on first and last piece of equipment    Resistance Training Performed  Yes    VAD Patient?  No    PAD/SET Patient?  No      Pain Assessment   Currently in Pain?  No/denies    Pain Score  0-No pain    Multiple Pain Sites  No       Capillary Blood Glucose: No results found for this or any previous visit (from the past 24 hour(s)).    Social History   Tobacco Use  Smoking Status Never Smoker  Smokeless Tobacco Never Used    Goals Met:  Independence with exercise equipment Exercise tolerated well Strength training completed today  Goals Unmet:  Not Applicable  Comments: Service time is from 1000 to 1050    Dr. Rush Farmer is Medical Director for Pulmonary Rehab at [facility].

## 2019-03-24 ENCOUNTER — Other Ambulatory Visit: Payer: Self-pay

## 2019-03-24 ENCOUNTER — Encounter (HOSPITAL_COMMUNITY)
Admission: RE | Admit: 2019-03-24 | Discharge: 2019-03-24 | Disposition: A | Discharge status: Home / Self Care | Payer: Medicare Other | Source: Ambulatory Visit | Attending: Emergency Medicine | Admitting: Emergency Medicine

## 2019-03-24 VITALS — Wt 222.2 lb

## 2019-03-24 DIAGNOSIS — J986 Disorders of diaphragm: Secondary | ICD-10-CM

## 2019-03-24 NOTE — Progress Notes (Signed)
Whitney [name] 63 y.o. female Nutrition Note Spoke with pt. Nutrition Plan and Nutrition Survey goals reviewed with pt. Pt is following a Heart Healthy diet. Pt wants to lose wt. Pt has been trying to lose wt by cutting back on desserts/baked goods. Pt had bariatric surgery in 2015. She lost 120 lbs. She has since gained ~45 lbs. Pt has not been exercising and feels more sedentary d/t COVID19. Discussed incorporating short walks and resting as needed while also incorporating adequate protein and hydration. Wt loss tips reviewed (label reading, how to build a healthy plate, portion sizes, eating frequently across the day).  Pt with previous dx of type 2 diabetes prescribed metformin (prior to bariatric surgery). She has since lowered A1c and quit taking metformin. A1C 5.5 recently.  Per discussion, pt does not use canned/convenience foods often. Pt does not add salt to food. Pt does not eat out frequently.   Pt expressed understanding of the information reviewed.  Lab Results  Component Value Date   HGBA1C 5.5 12/03/2017    Wt Readings from Last 3 Encounters:  03/24/19 222 lb 3.6 oz (100.8 kg)  03/11/19 225 lb 12 oz (102.4 kg)  02/12/19 223 lb (101.2 kg)    Nutrition Diagnosis  ? Obese  II = 35-39.9 related to excessive energy intake as evidenced by a Body mass index is 38.14 kg/m.  ?  Nutrition Intervention ? Pt's individual nutrition plan reviewed with pt. ? Benefits of adopting Heart Healthy diet discussed when survey reviewed.   ? Pt will be provided handouts on healthy eating through the holidays, pulmonary nutrition, sodium, and healthy cooking tips. ? Continue client-centered nutrition education by RD, as part of interdisciplinary care.  Goal(s) ? Pt to identify food quantities necessary to achieve weight loss of 6-24 lb at graduation from cardiac rehab.  ? Pt to increase protein intake for 1.5 g/kg body weight goal ? Pt to limit desserts/baked goods to <3x/week  Plan:    Pt to attend nutrition classes ? Nutrition I ? Nutrition II ? Portion Distortion   Will provide client-centered nutrition education as part of interdisciplinary care  Monitor and evaluate progress toward nutrition goal with team.   Michaele Offer, MS, RDN, LDN

## 2019-03-24 NOTE — Progress Notes (Signed)
Pulmonary Individual Treatment Plan  Patient Details  Name: Whitney [name] MRN: [id number] Date of Birth: 10-Aug-1955 Referring Provider:     Pulmonary Rehab Walk Test from 03/11/2019 in [place]  Referring Provider  Dr. Lamonte Sakai      Initial Encounter Date:    Pulmonary Rehab Walk Test from 03/11/2019 in [place]  Date  03/11/19      Visit Diagnosis: Elevated hemidiaphragm  Patient's Home Medications on Admission:   Current Outpatient Medications:  .  albuterol (VENTOLIN HFA) 108 (90 Base) MCG/ACT inhaler, Inhale 2 puffs into the lungs every 4 (four) hours as needed for wheezing or shortness of breath., Disp: 18 g, Rfl: 5 .  aspirin EC 81 MG EC tablet, Take 1 tablet (81 mg total) by mouth daily., Disp: , Rfl:  .  ezetimibe (ZETIA) 10 MG tablet, Take 10 mg by mouth at bedtime. , Disp: , Rfl: 11 .  OVER THE COUNTER MEDICATION, Take 1 packet by mouth 2 (two) times daily. Bariatric Multivitamin, Disp: , Rfl:   Past Medical History: Past Medical History:  Diagnosis Date  . Allergic rhinitis   . Anemia    hx  . Arthritis   . Asthma    hx yrs ago  . Cirrhosis ([place]) last albumin 3.3 done at [facility] 06-16-2014 (under care everywhere tab in epic)   Secondary to Fatty liver --  followed by hepatology at [facility] (dr Gerald Dexter)   . Depression   . Diabetes mellitus type II    type 2 diet conrolled  . Dyspnea   . Fibromyalgia   . GERD (gastroesophageal reflux disease)   . Heart murmur    asymptomatic ---  1989 from rhuematic fever  . History of exercise stress test    05-05-2013---  negative bruce ETT given exercise workload,  no ischemia  . History of hiatal hernia   . History of kidney stones   . History of rheumatic fever    1989  . Hyperlipidemia   . Leukocytopenia   . Moderate aortic stenosis    AVA area 1.1cm2---  cardiologist --  dr Concepcion Living, 2014 in epic  . NASH (nonalcoholic steatohepatitis)   . OSA  (obstructive sleep apnea)    was using CPAP before gastric sleeve 2015--  no uses after wt loss  . Pneumonia    hx  . Sjogren's syndrome ([place])   . Thrombocytopenia ([place])     Tobacco Use: Social History   Tobacco Use  Smoking Status Never Smoker  Smokeless Tobacco Never Used    Labs: Recent Review Flowsheet Data    Labs for ITP Cardiac and Pulmonary Rehab Latest Ref Rng & Units 12/05/2017 12/05/2017 12/05/2017 12/05/2017 12/17/2018   Hemoglobin A1c 4.8 - 5.6 % - - - - -   PHART 7.350 - 7.450 7.430 7.349(L) - 7.326(L) -   PCO2ART 32.0 - 48.0 mmHg 38.9 41.7 - 43.5 -   HCO3 20.0 - 28.0 mmol/L 25.8 23.2 - 22.8 -   TCO2 22 - 32 mmol/L _0 ACIDBASEDEF 0.0 - 2.0 mmol/L - 3.0(H) - 3.0(H) -   O2SAT % 100.0 97.0 - 97.0 -      Capillary Blood Glucose: Lab Results  Component Value Date   GLUCAP 77 12/17/2018   GLUCAP 79 02/12/2018   GLUCAP 100 (H) 12/12/2017   GLUCAP 166 (H) 12/11/2017   GLUCAP 161 (H) 12/11/2017     Pulmonary Assessment  Scores: Pulmonary Assessment Scores    Row Name 03/11/19 1153 03/12/19 0701       ADL UCSD   SOB Score total  44  -      CAT Score   CAT Score  16  -      mMRC Score   mMRC Score  -  2      UCSD: Self-administered rating of dyspnea associated with activities of daily living (ADLs) 6-point scale (0 = "not at all" to 5 = "maximal or unable to do because of breathlessness")  Scoring Scores range from 0 to 120.  Minimally important difference is 5 units  CAT: CAT can identify the health impairment of COPD patients and is better correlated with disease progression.  CAT has a scoring range of zero to 40. The CAT score is classified into four groups of low (less than 10), medium (10 - 20), high (21-30) and very high (31-40) based on the impact level of disease on health status. A CAT score over 10 suggests significant symptoms.  A worsening CAT score could be explained by an exacerbation, poor medication adherence, poor inhaler  technique, or progression of COPD or comorbid conditions.  CAT MCID is 2 points  mMRC: mMRC (Modified Medical Research Council) Dyspnea Scale is used to assess the degree of baseline functional disability in patients of respiratory disease due to dyspnea. No minimal important difference is established. A decrease in score of 1 point or greater is considered a positive change.   Pulmonary Function Assessment: Pulmonary Function Assessment - 03/11/19 1336      Breath   Bilateral Breath Sounds  Decreased;Clear    Shortness of Breath  Fear of Shortness of Breath;Limiting activity;Panic with Shortness of Breath       Exercise Target Goals: Exercise Program Goal: Individual exercise prescription set using results from initial 6 min walk test and THRR while considering  patient's activity barriers and safety.   Exercise Prescription Goal: Initial exercise prescription builds to 30-45 minutes a day of aerobic activity, 2-3 days per week.  Home exercise guidelines will be given to patient during program as part of exercise prescription that the participant will acknowledge.  Activity Barriers & Risk Stratification: Activity Barriers & Cardiac Risk Stratification - 03/11/19 1334      Activity Barriers & Cardiac Risk Stratification   Activity Barriers  Other (comment);Arthritis;Shortness of Breath;Fibromyalgia    Comments  sjogren syndrome    Cardiac Risk Stratification  Moderate       6 Minute Walk: 6 Minute Walk    Row Name 03/11/19 1230         6 Minute Walk   Phase  Initial     Distance  1185 feet     Walk Time  6 minutes     # of Rest Breaks  0     MPH  2.24     METS  2.64     RPE  12     Perceived Dyspnea   2     VO2 Peak  9.25     Symptoms  No     Resting HR  81 bpm     Resting BP  126/62     Resting Oxygen Saturation   96 %     Exercise Oxygen Saturation  during 6 min walk  88 %     Max Ex. HR  113 bpm     Max Ex. BP  154/88     2 Minute Post  BP  126/80        Interval HR   1 Minute HR  99     2 Minute HR  102     3 Minute HR  102     4 Minute HR  105     5 Minute HR  108     6 Minute HR  113     2 Minute Post HR  87     Interval Heart Rate?  Yes       Interval Oxygen   Interval Oxygen?  Yes     Baseline Oxygen Saturation %  96 %     1 Minute Oxygen Saturation %  92 %     1 Minute Liters of Oxygen  0 L     2 Minute Oxygen Saturation %  88 %     2 Minute Liters of Oxygen  0 L     3 Minute Oxygen Saturation %  89 %     3 Minute Liters of Oxygen  0 L     4 Minute Oxygen Saturation %  91 %     4 Minute Liters of Oxygen  0 L     5 Minute Oxygen Saturation %  89 %     5 Minute Liters of Oxygen  0 L     6 Minute Oxygen Saturation %  89 %     6 Minute Liters of Oxygen  0 L     2 Minute Post Oxygen Saturation %  95 %     2 Minute Post Liters of Oxygen  0 L        Oxygen Initial Assessment: Oxygen Initial Assessment - 03/12/19 0700      Home Oxygen   Home Oxygen Device  None    Sleep Oxygen Prescription  None    Home Exercise Oxygen Prescription  None    Home at Rest Exercise Oxygen Prescription  None      Initial 6 min Walk   Oxygen Used  None      Program Oxygen Prescription   Program Oxygen Prescription  None      Intervention   Short Term Goals  To learn and exhibit compliance with exercise, home and travel O2 prescription;To learn and understand importance of monitoring SPO2 with pulse oximeter and demonstrate accurate use of the pulse oximeter.;To learn and understand importance of maintaining oxygen saturations>88%;To learn and demonstrate proper pursed lip breathing techniques or other breathing techniques.;To learn and demonstrate proper use of respiratory medications    Long  Term Goals  Exhibits compliance with exercise, home and travel O2 prescription;Verbalizes importance of monitoring SPO2 with pulse oximeter and return demonstration;Maintenance of O2 saturations>88%;Exhibits proper breathing techniques, such as pursed  lip breathing or other method taught during program session;Compliance with respiratory medication       Oxygen Re-Evaluation: Oxygen Re-Evaluation    Row Name 03/24/19 0654             Program Oxygen Prescription   Program Oxygen Prescription  None         Home Oxygen   Home Oxygen Device  None       Sleep Oxygen Prescription  None       Home Exercise Oxygen Prescription  None       Home at Rest Exercise Oxygen Prescription  None         Goals/Expected Outcomes   Short Term Goals  To learn and exhibit compliance with  exercise, home and travel O2 prescription;To learn and understand importance of monitoring SPO2 with pulse oximeter and demonstrate accurate use of the pulse oximeter.;To learn and understand importance of maintaining oxygen saturations>88%;To learn and demonstrate proper pursed lip breathing techniques or other breathing techniques.;To learn and demonstrate proper use of respiratory medications       Long  Term Goals  Exhibits compliance with exercise, home and travel O2 prescription;Verbalizes importance of monitoring SPO2 with pulse oximeter and return demonstration;Maintenance of O2 saturations>88%;Exhibits proper breathing techniques, such as pursed lip breathing or other method taught during program session;Compliance with respiratory medication       Goals/Expected Outcomes  compliance          Oxygen Discharge (Final Oxygen Re-Evaluation): Oxygen Re-Evaluation - 03/24/19 0654      Program Oxygen Prescription   Program Oxygen Prescription  None      Home Oxygen   Home Oxygen Device  None    Sleep Oxygen Prescription  None    Home Exercise Oxygen Prescription  None    Home at Rest Exercise Oxygen Prescription  None      Goals/Expected Outcomes   Short Term Goals  To learn and exhibit compliance with exercise, home and travel O2 prescription;To learn and understand importance of monitoring SPO2 with pulse oximeter and demonstrate accurate use of the pulse  oximeter.;To learn and understand importance of maintaining oxygen saturations>88%;To learn and demonstrate proper pursed lip breathing techniques or other breathing techniques.;To learn and demonstrate proper use of respiratory medications    Long  Term Goals  Exhibits compliance with exercise, home and travel O2 prescription;Verbalizes importance of monitoring SPO2 with pulse oximeter and return demonstration;Maintenance of O2 saturations>88%;Exhibits proper breathing techniques, such as pursed lip breathing or other method taught during program session;Compliance with respiratory medication    Goals/Expected Outcomes  compliance       Initial Exercise Prescription: Initial Exercise Prescription - 03/12/19 0700      Date of Initial Exercise RX and Referring Provider   Date  03/11/19    Referring Provider  Dr. Lamonte Sakai      Recumbant Bike   Level  1    Minutes  15      NuStep   Level  2    SPM  80    Minutes  15      Prescription Details   Frequency (times per week)  2    Duration  Progress to 30 minutes of continuous aerobic without signs/symptoms of physical distress      Intensity   THRR 40-80% of Max Heartrate  62-124    Ratings of Perceived Exertion  11-13    Perceived Dyspnea  0-4      Progression   Progression  Continue progressive overload as per policy without signs/symptoms or physical distress.      Resistance Training   Training Prescription  Yes    Weight  orange bands    Reps  10-15       Perform Capillary Blood Glucose checks as needed.  Exercise Prescription Changes: Exercise Prescription Changes    Row Name 03/24/19 1100             Response to Exercise   Blood Pressure (Admit)  140/72       Blood Pressure (Exercise)  136/70       Blood Pressure (Exit)  126/84       Heart Rate (Admit)  84 bpm       Heart Rate (Exercise)  99 bpm       Heart Rate (Exit)  88 bpm       Oxygen Saturation (Admit)  95 %       Oxygen Saturation (Exercise)  89 %        Oxygen Saturation (Exit)  94 %       Rating of Perceived Exertion (Exercise)  13       Perceived Dyspnea (Exercise)  2       Duration  Continue with 30 min of aerobic exercise without signs/symptoms of physical distress.       Intensity  Other (comment)         Progression   Progression  Continue to progress workloads to maintain intensity without signs/symptoms of physical distress. 40-80% of HRR         Resistance Training   Training Prescription  Yes       Weight  orange bands       Reps  10-15       Time  10 Minutes         Recumbant Bike   Level  1       Minutes  15       METs  1.5         NuStep   Level  2       SPM  80       Minutes  15       METs  1.8          Exercise Comments:   Exercise Goals and Review: Exercise Goals    Row Name 03/11/19 1332 03/12/19 0705 03/24/19 0654         Exercise Goals   Increase Physical Activity  Yes  Yes  Yes     Intervention  Provide advice, education, support and counseling about physical activity/exercise needs.;Develop an individualized exercise prescription for aerobic and resistive training based on initial evaluation findings, risk stratification, comorbidities and participant's personal goals.  Provide advice, education, support and counseling about physical activity/exercise needs.;Develop an individualized exercise prescription for aerobic and resistive training based on initial evaluation findings, risk stratification, comorbidities and participant's personal goals.  Provide advice, education, support and counseling about physical activity/exercise needs.;Develop an individualized exercise prescription for aerobic and resistive training based on initial evaluation findings, risk stratification, comorbidities and participant's personal goals.     Expected Outcomes  Long Term: Exercising regularly at least 3-5 days a week.;Long Term: Add in home exercise to make exercise part of routine and to increase amount of physical  activity.;Short Term: Attend rehab on a regular basis to increase amount of physical activity.  Long Term: Exercising regularly at least 3-5 days a week.;Long Term: Add in home exercise to make exercise part of routine and to increase amount of physical activity.;Short Term: Attend rehab on a regular basis to increase amount of physical activity.  Long Term: Exercising regularly at least 3-5 days a week.;Long Term: Add in home exercise to make exercise part of routine and to increase amount of physical activity.;Short Term: Attend rehab on a regular basis to increase amount of physical activity.     Increase Strength and Stamina  Yes  Yes  Yes     Intervention  Provide advice, education, support and counseling about physical activity/exercise needs.;Develop an individualized exercise prescription for aerobic and resistive training based on initial evaluation findings, risk stratification, comorbidities and participant's personal goals.  Provide advice, education, support and counseling about physical activity/exercise needs.;Develop an  individualized exercise prescription for aerobic and resistive training based on initial evaluation findings, risk stratification, comorbidities and participant's personal goals.  Provide advice, education, support and counseling about physical activity/exercise needs.;Develop an individualized exercise prescription for aerobic and resistive training based on initial evaluation findings, risk stratification, comorbidities and participant's personal goals.     Expected Outcomes  Short Term: Increase workloads from initial exercise prescription for resistance, speed, and METs.;Short Term: Perform resistance training exercises routinely during rehab and add in resistance training at home;Long Term: Improve cardiorespiratory fitness, muscular endurance and strength as measured by increased METs and functional capacity (6MWT)  Short Term: Increase workloads from initial exercise  prescription for resistance, speed, and METs.;Short Term: Perform resistance training exercises routinely during rehab and add in resistance training at home;Long Term: Improve cardiorespiratory fitness, muscular endurance and strength as measured by increased METs and functional capacity (6MWT)  Short Term: Increase workloads from initial exercise prescription for resistance, speed, and METs.;Short Term: Perform resistance training exercises routinely during rehab and add in resistance training at home;Long Term: Improve cardiorespiratory fitness, muscular endurance and strength as measured by increased METs and functional capacity (6MWT)     Able to understand and use rate of perceived exertion (RPE) scale  Yes  Yes  Yes     Intervention  Provide education and explanation on how to use RPE scale  Provide education and explanation on how to use RPE scale  Provide education and explanation on how to use RPE scale     Expected Outcomes  Short Term: Able to use RPE daily in rehab to express subjective intensity level;Long Term:  Able to use RPE to guide intensity level when exercising independently  Short Term: Able to use RPE daily in rehab to express subjective intensity level;Long Term:  Able to use RPE to guide intensity level when exercising independently  Short Term: Able to use RPE daily in rehab to express subjective intensity level;Long Term:  Able to use RPE to guide intensity level when exercising independently     Able to understand and use Dyspnea scale  Yes  Yes  Yes     Intervention  Provide education and explanation on how to use Dyspnea scale  Provide education and explanation on how to use Dyspnea scale  Provide education and explanation on how to use Dyspnea scale     Expected Outcomes  Short Term: Able to use Dyspnea scale daily in rehab to express subjective sense of shortness of breath during exertion;Long Term: Able to use Dyspnea scale to guide intensity level when exercising  independently  Short Term: Able to use Dyspnea scale daily in rehab to express subjective sense of shortness of breath during exertion;Long Term: Able to use Dyspnea scale to guide intensity level when exercising independently  Short Term: Able to use Dyspnea scale daily in rehab to express subjective sense of shortness of breath during exertion;Long Term: Able to use Dyspnea scale to guide intensity level when exercising independently     Knowledge and understanding of Target Heart Rate Range (THRR)  Yes  Yes  Yes     Intervention  Provide education and explanation of THRR including how the numbers were predicted and where they are located for reference  Provide education and explanation of THRR including how the numbers were predicted and where they are located for reference  Provide education and explanation of THRR including how the numbers were predicted and where they are located for reference     Expected Outcomes  Short Term: Able to state/look up THRR;Long Term: Able to use THRR to govern intensity when exercising independently;Short Term: Able to use daily as guideline for intensity in rehab  Short Term: Able to state/look up THRR;Long Term: Able to use THRR to govern intensity when exercising independently;Short Term: Able to use daily as guideline for intensity in rehab  Short Term: Able to state/look up THRR;Long Term: Able to use THRR to govern intensity when exercising independently;Short Term: Able to use daily as guideline for intensity in rehab     Understanding of Exercise Prescription  Yes  Yes  Yes     Intervention  Provide education, explanation, and written materials on patient's individual exercise prescription  Provide education, explanation, and written materials on patient's individual exercise prescription  Provide education, explanation, and written materials on patient's individual exercise prescription     Expected Outcomes  Short Term: Able to explain program exercise  prescription;Long Term: Able to explain home exercise prescription to exercise independently  Short Term: Able to explain program exercise prescription;Long Term: Able to explain home exercise prescription to exercise independently  Short Term: Able to explain program exercise prescription;Long Term: Able to explain home exercise prescription to exercise independently        Exercise Goals Re-Evaluation : Exercise Goals Re-Evaluation    Row Name 03/24/19 0655             Exercise Goal Re-Evaluation   Exercise Goals Review  Increase Physical Activity;Increase Strength and Stamina;Able to understand and use rate of perceived exertion (RPE) scale;Able to understand and use Dyspnea scale;Knowledge and understanding of Target Heart Rate Range (THRR);Understanding of Exercise Prescription       Comments  Pt has attended 2 exercise sessions. Pt currently exercises at 2.0 METs on the stepper. Pt had previously completed cardiac rehab and made improvements in her functional capacity. I fully expect pt to make similar improvements. Will continue to monitor and progress as able.       Expected Outcomes  Through exercise at rehab and at home, the patient will decrease shortness of breath with daily activities and feel confident in carrying out an exercise regime at home.          Discharge Exercise Prescription (Final Exercise Prescription Changes): Exercise Prescription Changes - 03/24/19 1100      Response to Exercise   Blood Pressure (Admit)  140/72    Blood Pressure (Exercise)  136/70    Blood Pressure (Exit)  126/84    Heart Rate (Admit)  84 bpm    Heart Rate (Exercise)  99 bpm    Heart Rate (Exit)  88 bpm    Oxygen Saturation (Admit)  95 %    Oxygen Saturation (Exercise)  89 %    Oxygen Saturation (Exit)  94 %    Rating of Perceived Exertion (Exercise)  13    Perceived Dyspnea (Exercise)  2    Duration  Continue with 30 min of aerobic exercise without signs/symptoms of physical distress.     Intensity  Other (comment)      Progression   Progression  Continue to progress workloads to maintain intensity without signs/symptoms of physical distress.   40-80% of HRR     Resistance Training   Training Prescription  Yes    Weight  orange bands    Reps  10-15    Time  10 Minutes      Recumbant Bike   Level  1    Minutes  15  METs  1.5      NuStep   Level  2    SPM  80    Minutes  15    METs  1.8       Nutrition:  Target Goals: Understanding of nutrition guidelines, daily intake of sodium <1536m, cholesterol <2037m calories 30% from fat and 7% or less from saturated fats, daily to have 5 or more servings of fruits and vegetables.  Biometrics: Pre Biometrics - 03/11/19 1130      Pre Biometrics   Grip Strength  30 kg        Nutrition Therapy Plan and Nutrition Goals: Nutrition Therapy & Goals - 03/24/19 1110      Nutrition Therapy   Diet  Therapeutic Lifestyle Changes      Personal Nutrition Goals   Nutrition Goal  Pt to identify food quantities necessary to achieve weight loss of 6-24 lbs. at graduation from cardiac rehab. Goal wt loss of 20 lb desired.    Personal Goal #2  Pt to increase protein intake for 1.5 g/kg body weight goal      Intervention Plan   Intervention  Prescribe, educate and counsel regarding individualized specific dietary modifications aiming towards targeted core components such as weight, hypertension, lipid management, diabetes, heart failure and other comorbidities.;Nutrition handout(s) given to patient.    Expected Outcomes  Short Term Goal: A plan has been developed with personal nutrition goals set during dietitian appointment.;Long Term Goal: Adherence to prescribed nutrition plan.       Nutrition Assessments: Nutrition Assessments - 03/24/19 1117      Rate Your Plate Scores   Pre Score  58       Nutrition Goals Re-Evaluation: Nutrition Goals Re-Evaluation    Row Name 03/24/19 1110 03/24/19 1117           Goals    Current Weight  -  222 lb (100.7 kg)        Personal Goal #3 Re-Evaluation   Personal Goal #3  Pt to limit desserts/baked goods to <3x/week  -         Nutrition Goals Discharge (Final Nutrition Goals Re-Evaluation): Nutrition Goals Re-Evaluation - 03/24/19 1117      Goals   Current Weight  222 lb (100.7 kg)       Psychosocial: Target Goals: Acknowledge presence or absence of significant depression and/or stress, maximize coping skills, provide positive support system. Participant is able to verbalize types and ability to use techniques and skills needed for reducing stress and depression.  Initial Review & Psychosocial Screening: Initial Psych Review & Screening - 03/11/19 1154      Initial Review   Current issues with  None Identified   every day stress.  Son who has major depression lives with patient     [place]      Barriers   Psychosocial barriers to participate in program  The patient should benefit from training in stress management and relaxation.      Screening Interventions   Interventions  Encouraged to exercise;Provide feedback about the scores to participant       Quality of Life Scores:  Scores of 19 and below usually indicate a poorer quality of life in these areas.  A difference of  2-3 points is a clinically meaningful difference.  A difference of 2-3 points in the total score of the Quality of Life Index has been associated with significant improvement in overall quality of  life, self-image, physical symptoms, and general health in studies assessing change in quality of life.  PHQ-9: Recent Review Flowsheet Data    Depression screen [facility] 2/9 03/11/2019 03/11/2019 04/28/2018 02/12/2018   Decreased Interest 1 1 0 0   Down, Depressed, Hopeless 1 1 0 0   PHQ - 2 Score 2 2 0 0   Altered sleeping 1 1 - -   Tired, decreased energy 1 1 - -   Change in appetite 0 0 - -   Feeling bad or failure about yourself  0 0 - -    Trouble concentrating 0 0 - -   Moving slowly or fidgety/restless 0 0 - -   Suicidal thoughts 0 0 - -   PHQ-9 Score 4 4 - -   Difficult doing work/chores Not difficult at all - - -     Interpretation of Total Score  Total Score Depression Severity:  1-4 = Minimal depression, 5-9 = Mild depression, 10-14 = Moderate depression, 15-19 = Moderately severe depression, 20-27 = Severe depression   Psychosocial Evaluation and Intervention:   Psychosocial Re-Evaluation: Psychosocial Re-Evaluation    Row Name 03/23/19 0928             Psychosocial Re-Evaluation   Current issues with  None Identified       Comments  No barriers or psychosocial concerns identified.       Expected Outcomes  For [name] to continue to have a positive attitude and no psychosocial concerns while in pulmonary rehab.       Interventions  Encouraged to attend Pulmonary Rehabilitation for the exercise       Continue Psychosocial Services   No Follow up required          Psychosocial Discharge (Final Psychosocial Re-Evaluation): Psychosocial Re-Evaluation - 03/23/19 7829      Psychosocial Re-Evaluation   Current issues with  None Identified    Comments  No barriers or psychosocial concerns identified.    Expected Outcomes  For [name] to continue to have a positive attitude and no psychosocial concerns while in pulmonary rehab.    Interventions  Encouraged to attend Pulmonary Rehabilitation for the exercise    Continue Psychosocial Services   No Follow up required       Education: Education Goals: Education classes will be provided on a weekly basis, covering required topics. Participant will state understanding/return demonstration of topics presented.  Learning Barriers/Preferences: Learning Barriers/Preferences - 03/11/19 1156      Learning Barriers/Preferences   Learning Barriers  Sight    Learning Preferences  Skilled Demonstration;Written Material;Verbal Instruction       Education Topics: Risk  Factor Reduction:  -Group instruction that is supported by a PowerPoint presentation. Instructor discusses the definition of a risk factor, different risk factors for pulmonary disease, and how the heart and lungs work together.     Nutrition for Pulmonary Patient:  -Group instruction provided by PowerPoint slides, verbal discussion, and written materials to support subject matter. The instructor gives an explanation and review of healthy diet recommendations, which includes a discussion on weight management, recommendations for fruit and vegetable consumption, as well as protein, fluid, caffeine, fiber, sodium, sugar, and alcohol. Tips for eating when patients are short of breath are discussed.   Pursed Lip Breathing:  -Group instruction that is supported by demonstration and informational handouts. Instructor discusses the benefits of pursed lip and diaphragmatic breathing and detailed demonstration on how to preform both.     Oxygen Safety:  -  Group instruction provided by PowerPoint, verbal discussion, and written material to support subject matter. There is an overview of "What is Oxygen" and "Why do we need it".  Instructor also reviews how to create a safe environment for oxygen use, the importance of using oxygen as prescribed, and the risks of noncompliance. There is a brief discussion on traveling with oxygen and resources the patient may utilize.   Oxygen Equipment:  -Group instruction provided by [facility] Staff utilizing handouts, written materials, and equipment demonstrations.   Signs and Symptoms:  -Group instruction provided by written material and verbal discussion to support subject matter. Warning signs and symptoms of infection, stroke, and heart attack are reviewed and when to call the physician/911 reinforced. Tips for preventing the spread of infection discussed.   Advanced Directives:  -Group instruction provided by verbal instruction and written material to support  subject matter. Instructor reviews Advanced Directive laws and proper instruction for filling out document.   Pulmonary Video:  -Group video education that reviews the importance of medication and oxygen compliance, exercise, good nutrition, pulmonary hygiene, and pursed lip and diaphragmatic breathing for the pulmonary patient.   Exercise for the Pulmonary Patient:  -Group instruction that is supported by a PowerPoint presentation. Instructor discusses benefits of exercise, core components of exercise, frequency, duration, and intensity of an exercise routine, importance of utilizing pulse oximetry during exercise, safety while exercising, and options of places to exercise outside of rehab.     Pulmonary Medications:  -Verbally interactive group education provided by instructor with focus on inhaled medications and proper administration.   Anatomy and Physiology of the Respiratory System and Intimacy:  -Group instruction provided by PowerPoint, verbal discussion, and written material to support subject matter. Instructor reviews respiratory cycle and anatomical components of the respiratory system and their functions. Instructor also reviews differences in obstructive and restrictive respiratory diseases with examples of each. Intimacy, Sex, and Sexuality differences are reviewed with a discussion on how relationships can change when diagnosed with pulmonary disease. Common sexual concerns are reviewed.   MD DAY -A group question and answer session with a medical doctor that allows participants to ask questions that relate to their pulmonary disease state.   OTHER EDUCATION -Group or individual verbal, written, or video instructions that support the educational goals of the pulmonary rehab program.   Holiday Eating Survival Tips:  -Group instruction provided by PowerPoint slides, verbal discussion, and written materials to support subject matter. The instructor gives patients tips,  tricks, and techniques to help them not only survive but enjoy the holidays despite the onslaught of food that accompanies the holidays.   PULMONARY REHAB OTHER RESPIRATORY from 03/24/2019 in [place]  Date  03/24/19  Educator  -- [Handout]      Knowledge Questionnaire Score: Knowledge Questionnaire Score - 03/11/19 1200      Knowledge Questionnaire Score   Pre Score  2/18       Core Components/Risk Factors/Patient Goals at Admission: Personal Goals and Risk Factors at Admission - 03/23/19 0929      Core Components/Risk Factors/Patient Goals on Admission    Weight Management  Weight Loss    Improve shortness of breath with ADL's  Yes    Intervention  Provide education, individualized exercise plan and daily activity instruction to help decrease symptoms of SOB with activities of daily living.    Expected Outcomes  Short Term: Improve cardiorespiratory fitness to achieve a reduction of symptoms when performing ADLs;Long Term: Be  able to perform more ADLs without symptoms or delay the onset of symptoms       Core Components/Risk Factors/Patient Goals Review:  Goals and Risk Factor Review    Row Name 03/23/19 0931             Core Components/Risk Factors/Patient Goals Review   Personal Goals Review  Weight Management/Obesity;Develop more efficient breathing techniques such as purse lipped breathing and diaphragmatic breathing and practicing self-pacing with activity.;Improve shortness of breath with ADL's       Review  [name] just started pulmonary rehab, has attended 2 exercise sessions, it is too early to have achieved any program goals.       Expected Outcomes  In the next full 30 days she will have lost some weight and her shortness of breath will have improved somewhat and her workloads will be increased as tolerated.          Core Components/Risk Factors/Patient Goals at Discharge (Final Review):  Goals and Risk Factor Review - 03/23/19 0931       Core Components/Risk Factors/Patient Goals Review   Personal Goals Review  Weight Management/Obesity;Develop more efficient breathing techniques such as purse lipped breathing and diaphragmatic breathing and practicing self-pacing with activity.;Improve shortness of breath with ADL's    Review  [name] just started pulmonary rehab, has attended 2 exercise sessions, it is too early to have achieved any program goals.    Expected Outcomes  In the next full 30 days she will have lost some weight and her shortness of breath will have improved somewhat and her workloads will be increased as tolerated.       ITP Comments:   Comments: ITP REVIEW Pt is making expected progress toward pulmonary rehab goals after completing 3 sessions. Recommend continued exercise, life style modification, education, and utilization of breathing techniques to increase stamina and strength and decrease shortness of breath with exertion.

## 2019-03-24 NOTE — Progress Notes (Signed)
Daily Session Note  Patient Details  Name: Whitney [name] MRN: [id number] Date of Birth: 25-Oct-1955 Referring Provider:     Pulmonary Rehab Walk Test from 03/11/2019 in [place]  Referring Provider  Dr. Lamonte Sakai      Encounter Date: 03/24/2019  Check In: Session Check In - 03/24/19 1043      Check-In   Supervising physician immediately available to respond to emergencies  [facility] immediately available    Physician(s)  Dr. Doristine Bosworth    Location  [facility]    Staff Present  Rosebud Poles, RN, Bjorn Loser, MS, Exercise Physiologist;Saraih Lorton Ysidro Evert, RN    Virtual Visit  No    Medication changes reported      No    Fall or balance concerns reported     No    Tobacco Cessation  No Change    Warm-up and Cool-down  Performed on first and last piece of equipment    Resistance Training Performed  Yes    VAD Patient?  No    PAD/SET Patient?  No      Pain Assessment   Currently in Pain?  No/denies    Multiple Pain Sites  No       Capillary Blood Glucose: No results found for this or any previous visit (from the past 24 hour(s)).  Exercise Prescription Changes - 03/24/19 1100      Response to Exercise   Blood Pressure (Admit)  140/72    Blood Pressure (Exercise)  136/70    Blood Pressure (Exit)  126/84    Heart Rate (Admit)  84 bpm    Heart Rate (Exercise)  99 bpm    Heart Rate (Exit)  88 bpm    Oxygen Saturation (Admit)  95 %    Oxygen Saturation (Exercise)  89 %    Oxygen Saturation (Exit)  94 %    Rating of Perceived Exertion (Exercise)  13    Perceived Dyspnea (Exercise)  2    Duration  Continue with 30 min of aerobic exercise without signs/symptoms of physical distress.    Intensity  Other (comment)      Progression   Progression  Continue to progress workloads to maintain intensity without signs/symptoms of physical distress.   40-80% of HRR     Resistance Training   Training Prescription  Yes    Weight   orange bands    Reps  10-15    Time  10 Minutes      Recumbant Bike   Level  1    Minutes  15    METs  1.5      NuStep   Level  2    SPM  80    Minutes  15    METs  1.8       Social History   Tobacco Use  Smoking Status Never Smoker  Smokeless Tobacco Never Used    Goals Met:  Exercise tolerated well No report of cardiac concerns or symptoms Strength training completed today  Goals Unmet:  Not Applicable  Comments: Service time is from 1000 to 1106    Dr. Rush Farmer is Medical Director for Pulmonary Rehab at [facility].

## 2019-03-26 ENCOUNTER — Telehealth (HOSPITAL_COMMUNITY): Payer: Self-pay | Admitting: Family Medicine

## 2019-03-26 ENCOUNTER — Encounter (HOSPITAL_COMMUNITY): Payer: Medicare Other

## 2019-03-31 ENCOUNTER — Encounter (HOSPITAL_COMMUNITY)
Admission: RE | Admit: 2019-03-31 | Discharge: 2019-03-31 | Disposition: A | Discharge status: Home / Self Care | Payer: Medicare Other | Source: Ambulatory Visit | Attending: Emergency Medicine | Admitting: Emergency Medicine

## 2019-03-31 ENCOUNTER — Other Ambulatory Visit: Payer: Self-pay

## 2019-03-31 DIAGNOSIS — J986 Disorders of diaphragm: Secondary | ICD-10-CM | POA: Diagnosis not present

## 2019-03-31 NOTE — Progress Notes (Signed)
Daily Session Note  Patient Details  Name: Whitney [name] MRN: [id number] Date of Birth: Jul 18, 1955 Referring Provider:     Pulmonary Rehab Walk Test from 03/11/2019 in [place]  Referring Provider  Dr. Lamonte Sakai      Encounter Date: 03/31/2019  Check In: Session Check In - 03/31/19 1016      Check-In   Supervising physician immediately available to respond to emergencies  [facility] immediately available    Physician(s)  Dr. Wyline Copas    Location  [facility]    Staff Present  Rosebud Poles, RN, Bjorn Loser, MS, Exercise Physiologist;Nariah Morgano Ysidro Evert, RN    Virtual Visit  No    Medication changes reported      No    Fall or balance concerns reported     No    Tobacco Cessation  No Change    Warm-up and Cool-down  Performed as group-led instruction    Resistance Training Performed  Yes    VAD Patient?  No    PAD/SET Patient?  No      Pain Assessment   Currently in Pain?  No/denies    Multiple Pain Sites  No       Capillary Blood Glucose: No results found for this or any previous visit (from the past 24 hour(s)).    Social History   Tobacco Use  Smoking Status Never Smoker  Smokeless Tobacco Never Used    Goals Met:  Exercise tolerated well No report of cardiac concerns or symptoms Strength training completed today  Goals Unmet:  Not Applicable  Comments: Service time is from 1005 to 1050    Dr. Rush Farmer is Medical Director for Pulmonary Rehab at [facility].

## 2019-04-07 ENCOUNTER — Encounter (HOSPITAL_COMMUNITY)
Admission: RE | Admit: 2019-04-07 | Discharge: 2019-04-07 | Disposition: A | Discharge status: Home / Self Care | Payer: Medicare Other | Source: Ambulatory Visit | Attending: Emergency Medicine | Admitting: Emergency Medicine

## 2019-04-07 ENCOUNTER — Other Ambulatory Visit: Payer: Self-pay

## 2019-04-07 VITALS — Wt 226.0 lb

## 2019-04-07 DIAGNOSIS — J986 Disorders of diaphragm: Secondary | ICD-10-CM | POA: Diagnosis present

## 2019-04-07 NOTE — Progress Notes (Signed)
Daily Session Note  Patient Details  Name: Whitney [name] MRN: [id number] Date of Birth: 1955/11/06 Referring Provider:     Pulmonary Rehab Walk Test from 03/11/2019 in [place]  Referring Provider  Dr. Lamonte Sakai      Encounter Date: 04/07/2019  Check In: Session Check In - 04/07/19 1041      Check-In   Supervising physician immediately available to respond to emergencies  [facility] immediately available    Physician(s)  Dr. Eliseo Squires    Location  [facility]    Staff Present  Rosebud Poles, RN, Bjorn Loser, MS, Exercise Physiologist;Christhoper Busbee Ysidro Evert, RN    Virtual Visit  No    Medication changes reported      No    Tobacco Cessation  No Change    Warm-up and Cool-down  Performed on first and last piece of equipment    Resistance Training Performed  Yes    VAD Patient?  No    PAD/SET Patient?  No      Pain Assessment   Currently in Pain?  No/denies    Multiple Pain Sites  No       Capillary Blood Glucose: No results found for this or any previous visit (from the past 24 hour(s)).  Exercise Prescription Changes - 04/07/19 1100      Response to Exercise   Blood Pressure (Admit)  136/70    Blood Pressure (Exercise)  140/74    Blood Pressure (Exit)  120/84    Heart Rate (Admit)  83 bpm    Heart Rate (Exercise)  92 bpm    Heart Rate (Exit)  85 bpm    Oxygen Saturation (Admit)  95 %    Oxygen Saturation (Exercise)  91 %    Oxygen Saturation (Exit)  93 %    Rating of Perceived Exertion (Exercise)  13    Perceived Dyspnea (Exercise)  3    Duration  Continue with 30 min of aerobic exercise without signs/symptoms of physical distress.    Intensity  THRR unchanged      Progression   Progression  Continue to progress workloads to maintain intensity without signs/symptoms of physical distress.      Resistance Training   Training Prescription  Yes    Weight  orange bands    Reps  10-15    Time  10 Minutes      Recumbant  Bike   Level  1.5    Minutes  15      NuStep   Level  3    SPM  80    Minutes  15    METs  1.9       Social History   Tobacco Use  Smoking Status Never Smoker  Smokeless Tobacco Never Used    Goals Met:  Exercise tolerated well No report of cardiac concerns or symptoms Strength training completed today  Goals Unmet:  Not Applicable  Comments: Service time is from 1015 to 1109    Dr. Rush Farmer is Medical Director for Pulmonary Rehab at [facility].

## 2019-04-09 ENCOUNTER — Encounter (HOSPITAL_COMMUNITY): Payer: Medicare Other

## 2019-04-14 ENCOUNTER — Encounter (HOSPITAL_COMMUNITY)
Admission: RE | Admit: 2019-04-14 | Discharge: 2019-04-14 | Disposition: A | Discharge status: Home / Self Care | Payer: Medicare Other | Source: Ambulatory Visit | Attending: Emergency Medicine | Admitting: Emergency Medicine

## 2019-04-14 ENCOUNTER — Other Ambulatory Visit: Payer: Self-pay

## 2019-04-14 DIAGNOSIS — J986 Disorders of diaphragm: Secondary | ICD-10-CM

## 2019-04-14 NOTE — Progress Notes (Signed)
Daily Session Note  Patient Details  Name: Whitney [name] MRN: [id number] Date of Birth: 1955-07-05 Referring Provider:     Pulmonary Rehab Walk Test from 03/11/2019 in [place]  Referring Provider  Dr. Lamonte Sakai      Encounter Date: 04/14/2019  Check In: Session Check In - 04/14/19 1121      Check-In   Supervising physician immediately available to respond to emergencies  [facility] immediately available    Physician(s)  Dr. Benny Lennert    Location  [facility]    Staff Present  Rosebud Poles, RN, Bjorn Loser, MS, Exercise Physiologist;Kattie Santoyo Ysidro Evert, RN    Virtual Visit  No    Medication changes reported      No    Fall or balance concerns reported     No    Tobacco Cessation  No Change    Warm-up and Cool-down  Performed as group-led instruction    Resistance Training Performed  Yes    VAD Patient?  No    PAD/SET Patient?  No      Pain Assessment   Currently in Pain?  No/denies    Multiple Pain Sites  No       Capillary Blood Glucose: No results found for this or any previous visit (from the past 24 hour(s)).    Social History   Tobacco Use  Smoking Status Never Smoker  Smokeless Tobacco Never Used    Goals Met:  Exercise tolerated well No report of cardiac concerns or symptoms Strength training completed today  Goals Unmet:  Not Applicable  Comments: Service time is from 1010 to 3    Dr. Rush Farmer is Medical Director for Pulmonary Rehab at [facility].

## 2019-04-16 ENCOUNTER — Encounter (HOSPITAL_COMMUNITY): Payer: Medicare Other

## 2019-04-21 ENCOUNTER — Other Ambulatory Visit: Payer: Self-pay

## 2019-04-21 ENCOUNTER — Encounter (HOSPITAL_COMMUNITY): Payer: Medicare Other

## 2019-04-21 ENCOUNTER — Encounter (HOSPITAL_COMMUNITY)
Admission: RE | Admit: 2019-04-21 | Discharge: 2019-04-21 | Disposition: A | Discharge status: Home / Self Care | Payer: Medicare Other | Source: Ambulatory Visit | Attending: Emergency Medicine | Admitting: Emergency Medicine

## 2019-04-21 VITALS — Wt 221.3 lb

## 2019-04-21 DIAGNOSIS — J986 Disorders of diaphragm: Secondary | ICD-10-CM

## 2019-04-21 NOTE — Progress Notes (Signed)
Pulmonary Individual Treatment Plan  Patient Details  Name: Whitney [name] MRN: [id number] Date of Birth: 10-Aug-1955 Referring Provider:     Pulmonary Rehab Walk Test from 03/11/2019 in [place]  Referring Provider  Dr. Lamonte Sakai      Initial Encounter Date:    Pulmonary Rehab Walk Test from 03/11/2019 in [place]  Date  03/11/19      Visit Diagnosis: Elevated hemidiaphragm  Patient's Home Medications on Admission:   Current Outpatient Medications:  .  albuterol (VENTOLIN HFA) 108 (90 Base) MCG/ACT inhaler, Inhale 2 puffs into the lungs every 4 (four) hours as needed for wheezing or shortness of breath., Disp: 18 g, Rfl: 5 .  aspirin EC 81 MG EC tablet, Take 1 tablet (81 mg total) by mouth daily., Disp: , Rfl:  .  ezetimibe (ZETIA) 10 MG tablet, Take 10 mg by mouth at bedtime. , Disp: , Rfl: 11 .  OVER THE COUNTER MEDICATION, Take 1 packet by mouth 2 (two) times daily. Bariatric Multivitamin, Disp: , Rfl:   Past Medical History: Past Medical History:  Diagnosis Date  . Allergic rhinitis   . Anemia    hx  . Arthritis   . Asthma    hx yrs ago  . Cirrhosis ([place]) last albumin 3.3 done at [facility] 06-16-2014 (under care everywhere tab in epic)   Secondary to Fatty liver --  followed by hepatology at [facility] (dr Gerald Dexter)   . Depression   . Diabetes mellitus type II    type 2 diet conrolled  . Dyspnea   . Fibromyalgia   . GERD (gastroesophageal reflux disease)   . Heart murmur    asymptomatic ---  1989 from rhuematic fever  . History of exercise stress test    05-05-2013---  negative bruce ETT given exercise workload,  no ischemia  . History of hiatal hernia   . History of kidney stones   . History of rheumatic fever    1989  . Hyperlipidemia   . Leukocytopenia   . Moderate aortic stenosis    AVA area 1.1cm2---  cardiologist --  dr Concepcion Living, 2014 in epic  . NASH (nonalcoholic steatohepatitis)   . OSA  (obstructive sleep apnea)    was using CPAP before gastric sleeve 2015--  no uses after wt loss  . Pneumonia    hx  . Sjogren's syndrome ([place])   . Thrombocytopenia ([place])     Tobacco Use: Social History   Tobacco Use  Smoking Status Never Smoker  Smokeless Tobacco Never Used    Labs: Recent Review Flowsheet Data    Labs for ITP Cardiac and Pulmonary Rehab Latest Ref Rng & Units 12/05/2017 12/05/2017 12/05/2017 12/05/2017 12/17/2018   Hemoglobin A1c 4.8 - 5.6 % - - - - -   PHART 7.350 - 7.450 7.430 7.349(L) - 7.326(L) -   PCO2ART 32.0 - 48.0 mmHg 38.9 41.7 - 43.5 -   HCO3 20.0 - 28.0 mmol/L 25.8 23.2 - 22.8 -   TCO2 22 - 32 mmol/L _0 ACIDBASEDEF 0.0 - 2.0 mmol/L - 3.0(H) - 3.0(H) -   O2SAT % 100.0 97.0 - 97.0 -      Capillary Blood Glucose: Lab Results  Component Value Date   GLUCAP 77 12/17/2018   GLUCAP 79 02/12/2018   GLUCAP 100 (H) 12/12/2017   GLUCAP 166 (H) 12/11/2017   GLUCAP 161 (H) 12/11/2017     Pulmonary Assessment  Scores: Pulmonary Assessment Scores    Row Name 03/11/19 1153 03/12/19 0701       ADL UCSD   SOB Score total  44  --      CAT Score   CAT Score  16  --      mMRC Score   mMRC Score  --  2      UCSD: Self-administered rating of dyspnea associated with activities of daily living (ADLs) 6-point scale (0 = "not at all" to 5 = "maximal or unable to do because of breathlessness")  Scoring Scores range from 0 to 120.  Minimally important difference is 5 units  CAT: CAT can identify the health impairment of COPD patients and is better correlated with disease progression.  CAT has a scoring range of zero to 40. The CAT score is classified into four groups of low (less than 10), medium (10 - 20), high (21-30) and very high (31-40) based on the impact level of disease on health status. A CAT score over 10 suggests significant symptoms.  A worsening CAT score could be explained by an exacerbation, poor medication adherence, poor inhaler  technique, or progression of COPD or comorbid conditions.  CAT MCID is 2 points  mMRC: mMRC (Modified Medical Research Council) Dyspnea Scale is used to assess the degree of baseline functional disability in patients of respiratory disease due to dyspnea. No minimal important difference is established. A decrease in score of 1 point or greater is considered a positive change.   Pulmonary Function Assessment: Pulmonary Function Assessment - 03/11/19 1336      Breath   Bilateral Breath Sounds  Decreased;Clear    Shortness of Breath  Fear of Shortness of Breath;Limiting activity;Panic with Shortness of Breath       Exercise Target Goals: Exercise Program Goal: Individual exercise prescription set using results from initial 6 min walk test and THRR while considering  patient's activity barriers and safety.   Exercise Prescription Goal: Initial exercise prescription builds to 30-45 minutes a day of aerobic activity, 2-3 days per week.  Home exercise guidelines will be given to patient during program as part of exercise prescription that the participant will acknowledge.  Activity Barriers & Risk Stratification: Activity Barriers & Cardiac Risk Stratification - 03/11/19 1334      Activity Barriers & Cardiac Risk Stratification   Activity Barriers  Other (comment);Arthritis;Shortness of Breath;Fibromyalgia    Comments  sjogren syndrome    Cardiac Risk Stratification  Moderate       6 Minute Walk: 6 Minute Walk    Row Name 03/11/19 1230         6 Minute Walk   Phase  Initial     Distance  1185 feet     Walk Time  6 minutes     # of Rest Breaks  0     MPH  2.24     METS  2.64     RPE  12     Perceived Dyspnea   2     VO2 Peak  9.25     Symptoms  No     Resting HR  81 bpm     Resting BP  126/62     Resting Oxygen Saturation   96 %     Exercise Oxygen Saturation  during 6 min walk  88 %     Max Ex. HR  113 bpm     Max Ex. BP  154/88     2 Minute Post  BP  126/80        Interval HR   1 Minute HR  99     2 Minute HR  102     3 Minute HR  102     4 Minute HR  105     5 Minute HR  108     6 Minute HR  113     2 Minute Post HR  87     Interval Heart Rate?  Yes       Interval Oxygen   Interval Oxygen?  Yes     Baseline Oxygen Saturation %  96 %     1 Minute Oxygen Saturation %  92 %     1 Minute Liters of Oxygen  0 L     2 Minute Oxygen Saturation %  88 %     2 Minute Liters of Oxygen  0 L     3 Minute Oxygen Saturation %  89 %     3 Minute Liters of Oxygen  0 L     4 Minute Oxygen Saturation %  91 %     4 Minute Liters of Oxygen  0 L     5 Minute Oxygen Saturation %  89 %     5 Minute Liters of Oxygen  0 L     6 Minute Oxygen Saturation %  89 %     6 Minute Liters of Oxygen  0 L     2 Minute Post Oxygen Saturation %  95 %     2 Minute Post Liters of Oxygen  0 L        Oxygen Initial Assessment: Oxygen Initial Assessment - 03/12/19 0700      Home Oxygen   Home Oxygen Device  None    Sleep Oxygen Prescription  None    Home Exercise Oxygen Prescription  None    Home at Rest Exercise Oxygen Prescription  None      Initial 6 min Walk   Oxygen Used  None      Program Oxygen Prescription   Program Oxygen Prescription  None      Intervention   Short Term Goals  To learn and exhibit compliance with exercise, home and travel O2 prescription;To learn and understand importance of monitoring SPO2 with pulse oximeter and demonstrate accurate use of the pulse oximeter.;To learn and understand importance of maintaining oxygen saturations>88%;To learn and demonstrate proper pursed lip breathing techniques or other breathing techniques.;To learn and demonstrate proper use of respiratory medications    Long  Term Goals  Exhibits compliance with exercise, home and travel O2 prescription;Verbalizes importance of monitoring SPO2 with pulse oximeter and return demonstration;Maintenance of O2 saturations>88%;Exhibits proper breathing techniques, such as pursed  lip breathing or other method taught during program session;Compliance with respiratory medication       Oxygen Re-Evaluation: Oxygen Re-Evaluation    Row Name 03/24/19 0654 04/20/19 1059           Program Oxygen Prescription   Program Oxygen Prescription  None  None        Home Oxygen   Home Oxygen Device  None  None      Sleep Oxygen Prescription  None  None      Home Exercise Oxygen Prescription  None  None      Home at Rest Exercise Oxygen Prescription  None  None      Compliance with Home Oxygen Use  --  Yes  Goals/Expected Outcomes   Short Term Goals  To learn and exhibit compliance with exercise, home and travel O2 prescription;To learn and understand importance of monitoring SPO2 with pulse oximeter and demonstrate accurate use of the pulse oximeter.;To learn and understand importance of maintaining oxygen saturations>88%;To learn and demonstrate proper pursed lip breathing techniques or other breathing techniques.;To learn and demonstrate proper use of respiratory medications  To learn and exhibit compliance with exercise, home and travel O2 prescription;To learn and understand importance of monitoring SPO2 with pulse oximeter and demonstrate accurate use of the pulse oximeter.;To learn and understand importance of maintaining oxygen saturations>88%;To learn and demonstrate proper pursed lip breathing techniques or other breathing techniques.;To learn and demonstrate proper use of respiratory medications      Long  Term Goals  Exhibits compliance with exercise, home and travel O2 prescription;Verbalizes importance of monitoring SPO2 with pulse oximeter and return demonstration;Maintenance of O2 saturations>88%;Exhibits proper breathing techniques, such as pursed lip breathing or other method taught during program session;Compliance with respiratory medication  Exhibits compliance with exercise, home and travel O2 prescription;Verbalizes importance of monitoring SPO2 with  pulse oximeter and return demonstration;Maintenance of O2 saturations>88%;Exhibits proper breathing techniques, such as pursed lip breathing or other method taught during program session;Compliance with respiratory medication      Goals/Expected Outcomes  compliance  compliance         Oxygen Discharge (Final Oxygen Re-Evaluation): Oxygen Re-Evaluation - 04/20/19 1059      Program Oxygen Prescription   Program Oxygen Prescription  None      Home Oxygen   Home Oxygen Device  None    Sleep Oxygen Prescription  None    Home Exercise Oxygen Prescription  None    Home at Rest Exercise Oxygen Prescription  None    Compliance with Home Oxygen Use  Yes      Goals/Expected Outcomes   Short Term Goals  To learn and exhibit compliance with exercise, home and travel O2 prescription;To learn and understand importance of monitoring SPO2 with pulse oximeter and demonstrate accurate use of the pulse oximeter.;To learn and understand importance of maintaining oxygen saturations>88%;To learn and demonstrate proper pursed lip breathing techniques or other breathing techniques.;To learn and demonstrate proper use of respiratory medications    Long  Term Goals  Exhibits compliance with exercise, home and travel O2 prescription;Verbalizes importance of monitoring SPO2 with pulse oximeter and return demonstration;Maintenance of O2 saturations>88%;Exhibits proper breathing techniques, such as pursed lip breathing or other method taught during program session;Compliance with respiratory medication    Goals/Expected Outcomes  compliance       Initial Exercise Prescription: Initial Exercise Prescription - 03/12/19 0700      Date of Initial Exercise RX and Referring Provider   Date  03/11/19    Referring Provider  Dr. Lamonte Sakai      Recumbant Bike   Level  1    Minutes  15      NuStep   Level  2    SPM  80    Minutes  15      Prescription Details   Frequency (times per week)  2    Duration  Progress to  30 minutes of continuous aerobic without signs/symptoms of physical distress      Intensity   THRR 40-80% of Max Heartrate  62-124    Ratings of Perceived Exertion  11-13    Perceived Dyspnea  0-4      Progression   Progression  Continue progressive overload as per  policy without signs/symptoms or physical distress.      Resistance Training   Training Prescription  Yes    Weight  orange bands    Reps  10-15       Perform Capillary Blood Glucose checks as needed.  Exercise Prescription Changes: Exercise Prescription Changes    Row Name 03/24/19 1100 04/07/19 1100           Response to Exercise   Blood Pressure (Admit)  140/72  136/70      Blood Pressure (Exercise)  136/70  140/74      Blood Pressure (Exit)  126/84  120/84      Heart Rate (Admit)  84 bpm  83 bpm      Heart Rate (Exercise)  99 bpm  92 bpm      Heart Rate (Exit)  88 bpm  85 bpm      Oxygen Saturation (Admit)  95 %  95 %      Oxygen Saturation (Exercise)  89 %  91 %      Oxygen Saturation (Exit)  94 %  93 %      Rating of Perceived Exertion (Exercise)  13  13      Perceived Dyspnea (Exercise)  2  3      Duration  Continue with 30 min of aerobic exercise without signs/symptoms of physical distress.  Continue with 30 min of aerobic exercise without signs/symptoms of physical distress.      Intensity  Other (comment)  THRR unchanged        Progression   Progression  Continue to progress workloads to maintain intensity without signs/symptoms of physical distress. 40-80% of HRR  Continue to progress workloads to maintain intensity without signs/symptoms of physical distress.        Resistance Training   Training Prescription  Yes  Yes      Weight  orange bands  orange bands      Reps  10-15  10-15      Time  10 Minutes  10 Minutes        Recumbant Bike   Level  1  1.5      Minutes  15  15      METs  1.5  --        NuStep   Level  2  3      SPM  80  80      Minutes  15  15      METs  1.8  1.9          Exercise Comments:   Exercise Goals and Review: Exercise Goals    Row Name 03/11/19 1332 03/12/19 0705 03/24/19 0654 04/20/19 1100       Exercise Goals   Increase Physical Activity  Yes  Yes  Yes  Yes    Intervention  Provide advice, education, support and counseling about physical activity/exercise needs.;Develop an individualized exercise prescription for aerobic and resistive training based on initial evaluation findings, risk stratification, comorbidities and participant's personal goals.  Provide advice, education, support and counseling about physical activity/exercise needs.;Develop an individualized exercise prescription for aerobic and resistive training based on initial evaluation findings, risk stratification, comorbidities and participant's personal goals.  Provide advice, education, support and counseling about physical activity/exercise needs.;Develop an individualized exercise prescription for aerobic and resistive training based on initial evaluation findings, risk stratification, comorbidities and participant's personal goals.  Provide advice, education, support and counseling about physical activity/exercise needs.;Develop an individualized exercise  prescription for aerobic and resistive training based on initial evaluation findings, risk stratification, comorbidities and participant's personal goals.    Expected Outcomes  Long Term: Exercising regularly at least 3-5 days a week.;Long Term: Add in home exercise to make exercise part of routine and to increase amount of physical activity.;Short Term: Attend rehab on a regular basis to increase amount of physical activity.  Long Term: Exercising regularly at least 3-5 days a week.;Long Term: Add in home exercise to make exercise part of routine and to increase amount of physical activity.;Short Term: Attend rehab on a regular basis to increase amount of physical activity.  Long Term: Exercising regularly at least 3-5 days a  week.;Long Term: Add in home exercise to make exercise part of routine and to increase amount of physical activity.;Short Term: Attend rehab on a regular basis to increase amount of physical activity.  Long Term: Exercising regularly at least 3-5 days a week.;Long Term: Add in home exercise to make exercise part of routine and to increase amount of physical activity.;Short Term: Attend rehab on a regular basis to increase amount of physical activity.    Increase Strength and Stamina  Yes  Yes  Yes  Yes    Intervention  Provide advice, education, support and counseling about physical activity/exercise needs.;Develop an individualized exercise prescription for aerobic and resistive training based on initial evaluation findings, risk stratification, comorbidities and participant's personal goals.  Provide advice, education, support and counseling about physical activity/exercise needs.;Develop an individualized exercise prescription for aerobic and resistive training based on initial evaluation findings, risk stratification, comorbidities and participant's personal goals.  Provide advice, education, support and counseling about physical activity/exercise needs.;Develop an individualized exercise prescription for aerobic and resistive training based on initial evaluation findings, risk stratification, comorbidities and participant's personal goals.  Provide advice, education, support and counseling about physical activity/exercise needs.;Develop an individualized exercise prescription for aerobic and resistive training based on initial evaluation findings, risk stratification, comorbidities and participant's personal goals.    Expected Outcomes  Short Term: Increase workloads from initial exercise prescription for resistance, speed, and METs.;Short Term: Perform resistance training exercises routinely during rehab and add in resistance training at home;Long Term: Improve cardiorespiratory fitness, muscular endurance  and strength as measured by increased METs and functional capacity (6MWT)  Short Term: Increase workloads from initial exercise prescription for resistance, speed, and METs.;Short Term: Perform resistance training exercises routinely during rehab and add in resistance training at home;Long Term: Improve cardiorespiratory fitness, muscular endurance and strength as measured by increased METs and functional capacity (6MWT)  Short Term: Increase workloads from initial exercise prescription for resistance, speed, and METs.;Short Term: Perform resistance training exercises routinely during rehab and add in resistance training at home;Long Term: Improve cardiorespiratory fitness, muscular endurance and strength as measured by increased METs and functional capacity (6MWT)  Short Term: Increase workloads from initial exercise prescription for resistance, speed, and METs.;Short Term: Perform resistance training exercises routinely during rehab and add in resistance training at home;Long Term: Improve cardiorespiratory fitness, muscular endurance and strength as measured by increased METs and functional capacity (6MWT)    Able to understand and use rate of perceived exertion (RPE) scale  Yes  Yes  Yes  Yes    Intervention  Provide education and explanation on how to use RPE scale  Provide education and explanation on how to use RPE scale  Provide education and explanation on how to use RPE scale  Provide education and explanation on how to use RPE scale  Expected Outcomes  Short Term: Able to use RPE daily in rehab to express subjective intensity level;Long Term:  Able to use RPE to guide intensity level when exercising independently  Short Term: Able to use RPE daily in rehab to express subjective intensity level;Long Term:  Able to use RPE to guide intensity level when exercising independently  Short Term: Able to use RPE daily in rehab to express subjective intensity level;Long Term:  Able to use RPE to guide  intensity level when exercising independently  Short Term: Able to use RPE daily in rehab to express subjective intensity level;Long Term:  Able to use RPE to guide intensity level when exercising independently    Able to understand and use Dyspnea scale  Yes  Yes  Yes  Yes    Intervention  Provide education and explanation on how to use Dyspnea scale  Provide education and explanation on how to use Dyspnea scale  Provide education and explanation on how to use Dyspnea scale  Provide education and explanation on how to use Dyspnea scale    Expected Outcomes  Short Term: Able to use Dyspnea scale daily in rehab to express subjective sense of shortness of breath during exertion;Long Term: Able to use Dyspnea scale to guide intensity level when exercising independently  Short Term: Able to use Dyspnea scale daily in rehab to express subjective sense of shortness of breath during exertion;Long Term: Able to use Dyspnea scale to guide intensity level when exercising independently  Short Term: Able to use Dyspnea scale daily in rehab to express subjective sense of shortness of breath during exertion;Long Term: Able to use Dyspnea scale to guide intensity level when exercising independently  Short Term: Able to use Dyspnea scale daily in rehab to express subjective sense of shortness of breath during exertion;Long Term: Able to use Dyspnea scale to guide intensity level when exercising independently    Knowledge and understanding of Target Heart Rate Range (THRR)  Yes  Yes  Yes  Yes    Intervention  Provide education and explanation of THRR including how the numbers were predicted and where they are located for reference  Provide education and explanation of THRR including how the numbers were predicted and where they are located for reference  Provide education and explanation of THRR including how the numbers were predicted and where they are located for reference  Provide education and explanation of THRR including  how the numbers were predicted and where they are located for reference    Expected Outcomes  Short Term: Able to state/look up THRR;Long Term: Able to use THRR to govern intensity when exercising independently;Short Term: Able to use daily as guideline for intensity in rehab  Short Term: Able to state/look up THRR;Long Term: Able to use THRR to govern intensity when exercising independently;Short Term: Able to use daily as guideline for intensity in rehab  Short Term: Able to state/look up THRR;Long Term: Able to use THRR to govern intensity when exercising independently;Short Term: Able to use daily as guideline for intensity in rehab  Short Term: Able to state/look up THRR;Long Term: Able to use THRR to govern intensity when exercising independently;Short Term: Able to use daily as guideline for intensity in rehab    Understanding of Exercise Prescription  Yes  Yes  Yes  Yes    Intervention  Provide education, explanation, and written materials on patient's individual exercise prescription  Provide education, explanation, and written materials on patient's individual exercise prescription  Provide education, explanation, and  written materials on patient's individual exercise prescription  Provide education, explanation, and written materials on patient's individual exercise prescription    Expected Outcomes  Short Term: Able to explain program exercise prescription;Long Term: Able to explain home exercise prescription to exercise independently  Short Term: Able to explain program exercise prescription;Long Term: Able to explain home exercise prescription to exercise independently  Short Term: Able to explain program exercise prescription;Long Term: Able to explain home exercise prescription to exercise independently  Short Term: Able to explain program exercise prescription;Long Term: Able to explain home exercise prescription to exercise independently       Exercise Goals Re-Evaluation : Exercise Goals  Re-Evaluation    Row Name 03/24/19 0655 04/20/19 1100           Exercise Goal Re-Evaluation   Exercise Goals Review  Increase Physical Activity;Increase Strength and Stamina;Able to understand and use rate of perceived exertion (RPE) scale;Able to understand and use Dyspnea scale;Knowledge and understanding of Target Heart Rate Range (THRR);Understanding of Exercise Prescription  Increase Physical Activity;Increase Strength and Stamina;Able to understand and use rate of perceived exertion (RPE) scale;Able to understand and use Dyspnea scale;Knowledge and understanding of Target Heart Rate Range (THRR);Understanding of Exercise Prescription      Comments  Pt has attended 2 exercise sessions. Pt currently exercises at 2.0 METs on the stepper. Pt had previously completed cardiac rehab and made improvements in her functional capacity. I fully expect pt to make similar improvements. Will continue to monitor and progress as able.  Pt has attended 6 exercise sessions. Pt continues to exercise at 2.0 METs on the stepper. Will continue to monitor and progress as able.      Expected Outcomes  Through exercise at rehab and at home, the patient will decrease shortness of breath with daily activities and feel confident in carrying out an exercise regime at home.  Through exercise at rehab and at home, the patient will decrease shortness of breath with daily activities and feel confident in carrying out an exercise regime at home.         Discharge Exercise Prescription (Final Exercise Prescription Changes): Exercise Prescription Changes - 04/07/19 1100      Response to Exercise   Blood Pressure (Admit)  136/70    Blood Pressure (Exercise)  140/74    Blood Pressure (Exit)  120/84    Heart Rate (Admit)  83 bpm    Heart Rate (Exercise)  92 bpm    Heart Rate (Exit)  85 bpm    Oxygen Saturation (Admit)  95 %    Oxygen Saturation (Exercise)  91 %    Oxygen Saturation (Exit)  93 %    Rating of Perceived  Exertion (Exercise)  13    Perceived Dyspnea (Exercise)  3    Duration  Continue with 30 min of aerobic exercise without signs/symptoms of physical distress.    Intensity  THRR unchanged      Progression   Progression  Continue to progress workloads to maintain intensity without signs/symptoms of physical distress.      Resistance Training   Training Prescription  Yes    Weight  orange bands    Reps  10-15    Time  10 Minutes      Recumbant Bike   Level  1.5    Minutes  15      NuStep   Level  3    SPM  80    Minutes  15    METs  1.9       Nutrition:  Target Goals: Understanding of nutrition guidelines, daily intake of sodium <1534m, cholesterol <2069m calories 30% from fat and 7% or less from saturated fats, daily to have 5 or more servings of fruits and vegetables.  Biometrics: Pre Biometrics - 03/11/19 1130      Pre Biometrics   Grip Strength  30 kg        Nutrition Therapy Plan and Nutrition Goals: Nutrition Therapy & Goals - 03/24/19 1110      Nutrition Therapy   Diet  Therapeutic Lifestyle Changes      Personal Nutrition Goals   Nutrition Goal  Pt to identify food quantities necessary to achieve weight loss of 6-24 lbs. at graduation from cardiac rehab. Goal wt loss of 20 lb desired.    Personal Goal #2  Pt to increase protein intake for 1.5 g/kg body weight goal      Intervention Plan   Intervention  Prescribe, educate and counsel regarding individualized specific dietary modifications aiming towards targeted core components such as weight, hypertension, lipid management, diabetes, heart failure and other comorbidities.;Nutrition handout(s) given to patient.    Expected Outcomes  Short Term Goal: A plan has been developed with personal nutrition goals set during dietitian appointment.;Long Term Goal: Adherence to prescribed nutrition plan.       Nutrition Assessments: Nutrition Assessments - 03/24/19 1117      Rate Your Plate Scores   Pre Score  58        Nutrition Goals Re-Evaluation: Nutrition Goals Re-Evaluation    Row Name 03/24/19 1110 03/24/19 1117           Goals   Current Weight  --  222 lb (100.7 kg)        Personal Goal #3 Re-Evaluation   Personal Goal #3  Pt to limit desserts/baked goods to <3x/week  --         Nutrition Goals Discharge (Final Nutrition Goals Re-Evaluation): Nutrition Goals Re-Evaluation - 03/24/19 1117      Goals   Current Weight  222 lb (100.7 kg)       Psychosocial: Target Goals: Acknowledge presence or absence of significant depression and/or stress, maximize coping skills, provide positive support system. Participant is able to verbalize types and ability to use techniques and skills needed for reducing stress and depression.  Initial Review & Psychosocial Screening: Initial Psych Review & Screening - 03/11/19 1154      Initial Review   Current issues with  None Identified   every day stress.  Son who has major depression lives with patient     FaYancey Yes      Barriers   Psychosocial barriers to participate in program  The patient should benefit from training in stress management and relaxation.      Screening Interventions   Interventions  Encouraged to exercise;Provide feedback about the scores to participant       Quality of Life Scores:  Scores of 19 and below usually indicate a poorer quality of life in these areas.  A difference of  2-3 points is a clinically meaningful difference.  A difference of 2-3 points in the total score of the Quality of Life Index has been associated with significant improvement in overall quality of life, self-image, physical symptoms, and general health in studies assessing change in quality of life.  PHQ-9: Recent Review Flowsheet Data    Dep[facility] 03/11/2019 03/11/2019 04/28/2018  02/12/2018   Decreased Interest 1 1 0 0   Down, Depressed, Hopeless 1 1 0 0   PHQ - 2 Score 2 2 0 0   Altered  sleeping 1 1 - -   Tired, decreased energy 1 1 - -   Change in appetite 0 0 - -   Feeling bad or failure about yourself  0 0 - -   Trouble concentrating 0 0 - -   Moving slowly or fidgety/restless 0 0 - -   Suicidal thoughts 0 0 - -   PHQ-9 Score 4 4 - -   Difficult doing work/chores Not difficult at all - - -     Interpretation of Total Score  Total Score Depression Severity:  1-4 = Minimal depression, 5-9 = Mild depression, 10-14 = Moderate depression, 15-19 = Moderately severe depression, 20-27 = Severe depression   Psychosocial Evaluation and Intervention:   Psychosocial Re-Evaluation: Psychosocial Re-Evaluation    Row Name 03/23/19 [phone] 04/20/19 0949           Psychosocial Re-Evaluation   Current issues with  None Identified  None Identified      Comments  No barriers or psychosocial concerns identified.  She is handling every day stress without difficulty      Expected Outcomes  For [name] to continue to have a positive attitude and no psychosocial concerns while in pulmonary rehab.  For [name] to continue participating in pulmonary rehab without barriers or psychosocial concerns.      Interventions  Encouraged to attend Pulmonary Rehabilitation for the exercise  Stress management education;Relaxation education      Continue Psychosocial Services   No Follow up required  No Follow up required         Psychosocial Discharge (Final Psychosocial Re-Evaluation): Psychosocial Re-Evaluation - 04/20/19 0949      Psychosocial Re-Evaluation   Current issues with  None Identified    Comments  She is handling every day stress without difficulty    Expected Outcomes  For [name] to continue participating in pulmonary rehab without barriers or psychosocial concerns.    Interventions  Stress management education;Relaxation education    Continue Psychosocial Services   No Follow up required       Education: Education Goals: Education classes will be provided on a weekly basis, covering  required topics. Participant will state understanding/return demonstration of topics presented.  Learning Barriers/Preferences: Learning Barriers/Preferences - 03/11/19 1156      Learning Barriers/Preferences   Learning Barriers  Sight    Learning Preferences  Skilled Demonstration;Written Material;Verbal Instruction       Education Topics: Risk Factor Reduction:  -Group instruction that is supported by a PowerPoint presentation. Instructor discusses the definition of a risk factor, different risk factors for pulmonary disease, and how the heart and lungs work together.     Nutrition for Pulmonary Patient:  -Group instruction provided by PowerPoint slides, verbal discussion, and written materials to support subject matter. The instructor gives an explanation and review of healthy diet recommendations, which includes a discussion on weight management, recommendations for fruit and vegetable consumption, as well as protein, fluid, caffeine, fiber, sodium, sugar, and alcohol. Tips for eating when patients are short of breath are discussed.   Pursed Lip Breathing:  -Group instruction that is supported by demonstration and informational handouts. Instructor discusses the benefits of pursed lip and diaphragmatic breathing and detailed demonstration on how to preform both.     Oxygen Safety:  -Group instruction provided by PowerPoint, verbal  discussion, and written material to support subject matter. There is an overview of "What is Oxygen" and "Why do we need it".  Instructor also reviews how to create a safe environment for oxygen use, the importance of using oxygen as prescribed, and the risks of noncompliance. There is a brief discussion on traveling with oxygen and resources the patient may utilize.   Oxygen Equipment:  -Group instruction provided by [facility] Staff utilizing handouts, written materials, and equipment demonstrations.   Signs and Symptoms:  -Group instruction provided  by written material and verbal discussion to support subject matter. Warning signs and symptoms of infection, stroke, and heart attack are reviewed and when to call the physician/911 reinforced. Tips for preventing the spread of infection discussed.   Advanced Directives:  -Group instruction provided by verbal instruction and written material to support subject matter. Instructor reviews Advanced Directive laws and proper instruction for filling out document.   Pulmonary Video:  -Group video education that reviews the importance of medication and oxygen compliance, exercise, good nutrition, pulmonary hygiene, and pursed lip and diaphragmatic breathing for the pulmonary patient.   Exercise for the Pulmonary Patient:  -Group instruction that is supported by a PowerPoint presentation. Instructor discusses benefits of exercise, core components of exercise, frequency, duration, and intensity of an exercise routine, importance of utilizing pulse oximetry during exercise, safety while exercising, and options of places to exercise outside of rehab.     Pulmonary Medications:  -Verbally interactive group education provided by instructor with focus on inhaled medications and proper administration.   Anatomy and Physiology of the Respiratory System and Intimacy:  -Group instruction provided by PowerPoint, verbal discussion, and written material to support subject matter. Instructor reviews respiratory cycle and anatomical components of the respiratory system and their functions. Instructor also reviews differences in obstructive and restrictive respiratory diseases with examples of each. Intimacy, Sex, and Sexuality differences are reviewed with a discussion on how relationships can change when diagnosed with pulmonary disease. Common sexual concerns are reviewed.   PULMONARY REHAB OTHER RESPIRATORY from 04/07/2019 in [place]  Date  04/07/19  Educator  -- [Handout]       MD DAY -A group question and answer session with a medical doctor that allows participants to ask questions that relate to their pulmonary disease state.   OTHER EDUCATION -Group or individual verbal, written, or video instructions that support the educational goals of the pulmonary rehab program.   Holiday Eating Survival Tips:  -Group instruction provided by PowerPoint slides, verbal discussion, and written materials to support subject matter. The instructor gives patients tips, tricks, and techniques to help them not only survive but enjoy the holidays despite the onslaught of food that accompanies the holidays.   PULMONARY REHAB OTHER RESPIRATORY from 04/07/2019 in [place]  Date  03/24/19  Educator  03/31/19 [Handout]      Knowledge Questionnaire Score: Knowledge Questionnaire Score - 03/11/19 1200      Knowledge Questionnaire Score   Pre Score  2/18       Core Components/Risk Factors/Patient Goals at Admission: Personal Goals and Risk Factors at Admission - 03/23/19 0929      Core Components/Risk Factors/Patient Goals on Admission    Weight Management  Weight Loss    Improve shortness of breath with ADL's  Yes    Intervention  Provide education, individualized exercise plan and daily activity instruction to help decrease symptoms of SOB with activities of daily living.  Expected Outcomes  Short Term: Improve cardiorespiratory fitness to achieve a reduction of symptoms when performing ADLs;Long Term: Be able to perform more ADLs without symptoms or delay the onset of symptoms       Core Components/Risk Factors/Patient Goals Review:  Goals and Risk Factor Review    Row Name 03/23/19 0931 04/20/19 0952           Core Components/Risk Factors/Patient Goals Review   Personal Goals Review  Weight Management/Obesity;Develop more efficient breathing techniques such as purse lipped breathing and diaphragmatic breathing and practicing  self-pacing with activity.;Improve shortness of breath with ADL's  Weight Management/Obesity;Develop more efficient breathing techniques such as purse lipped breathing and diaphragmatic breathing and practicing self-pacing with activity.;Increase knowledge of respiratory medications and ability to use respiratory devices properly.;Improve shortness of breath with ADL's;Lipids      Review  [name] just started pulmonary rehab, has attended 2 exercise sessions, it is too early to have achieved any program goals.  [name] has not lost any weight, her attendance has been irregular due to dealing with family issues.  She is exercising on the nustep at level 3, and the recumbent bike on level 1.5 and is tolerating it well, her lipids are stable.      Expected Outcomes  In the next full 30 days she will have lost some weight and her shortness of breath will have improved somewhat and her workloads will be increased as tolerated.  See admission goals.         Core Components/Risk Factors/Patient Goals at Discharge (Final Review):  Goals and Risk Factor Review - 04/20/19 0952      Core Components/Risk Factors/Patient Goals Review   Personal Goals Review  Weight Management/Obesity;Develop more efficient breathing techniques such as purse lipped breathing and diaphragmatic breathing and practicing self-pacing with activity.;Increase knowledge of respiratory medications and ability to use respiratory devices properly.;Improve shortness of breath with ADL's;Lipids    Review  [name] has not lost any weight, her attendance has been irregular due to dealing with family issues.  She is exercising on the nustep at level 3, and the recumbent bike on level 1.5 and is tolerating it well, her lipids are stable.    Expected Outcomes  See admission goals.       ITP Comments:   Comments: ITP REVIEW Pt is making expected progress toward pulmonary rehab goals after completing 6 sessions. Recommend continued exercise, life style  modification, education, and utilization of breathing techniques to increase stamina and strength and decrease shortness of breath with exertion.

## 2019-04-21 NOTE — Progress Notes (Signed)
Daily Session Note  Patient Details  Name: Whitney [name] MRN: [id number] Date of Birth: 11/11/1955 Referring Provider:     Pulmonary Rehab Walk Test from 03/11/2019 in [place]  Referring Provider  Dr. Lamonte Sakai      Encounter Date: 04/21/2019  Check In: Session Check In - 04/21/19 1039      Check-In   Supervising physician immediately available to respond to emergencies  [facility] immediately available    Physician(s)  Dr. Earnest Conroy    Location  [facility]    Staff Present  Rosebud Poles, RN, Bjorn Loser, MS, Exercise Physiologist;Lisa Ysidro Evert, RN    Virtual Visit  No    Medication changes reported      No    Fall or balance concerns reported     No    Tobacco Cessation  No Change    Warm-up and Cool-down  Performed on first and last piece of equipment    Resistance Training Performed  Yes    VAD Patient?  No    PAD/SET Patient?  No      Pain Assessment   Currently in Pain?  No/denies    Multiple Pain Sites  No       Capillary Blood Glucose: No results found for this or any previous visit (from the past 24 hour(s)).  Exercise Prescription Changes - 04/21/19 1100      Response to Exercise   Blood Pressure (Admit)  130/80    Blood Pressure (Exercise)  132/70    Blood Pressure (Exit)  126/74    Heart Rate (Admit)  80 bpm    Heart Rate (Exercise)  95 bpm    Heart Rate (Exit)  82 bpm    Oxygen Saturation (Admit)  93 %    Oxygen Saturation (Exercise)  90 %    Oxygen Saturation (Exit)  94 %    Rating of Perceived Exertion (Exercise)  13    Perceived Dyspnea (Exercise)  3    Duration  Continue with 30 min of aerobic exercise without signs/symptoms of physical distress.    Intensity  THRR unchanged      Progression   Progression  Continue to progress workloads to maintain intensity without signs/symptoms of physical distress.      Resistance Training   Training Prescription  Yes    Weight  orange bands    Reps  10-15    Time  10 Minutes      Recumbant Bike   Level  2    Minutes  15      NuStep   Level  3    SPM  80    Minutes  15    METs  1.9       Social History   Tobacco Use  Smoking Status Never Smoker  Smokeless Tobacco Never Used    Goals Met:  Independence with exercise equipment Exercise tolerated well Strength training completed today  Goals Unmet:  Not Applicable  Comments: Service time is from 1005 to 36    Dr. Rush Farmer is Medical Director for Pulmonary Rehab at [facility]

## 2019-04-23 ENCOUNTER — Other Ambulatory Visit: Payer: Self-pay

## 2019-04-23 ENCOUNTER — Encounter (HOSPITAL_COMMUNITY)
Admission: RE | Admit: 2019-04-23 | Discharge: 2019-04-23 | Disposition: A | Discharge status: Home / Self Care | Payer: Medicare Other | Source: Ambulatory Visit | Attending: Emergency Medicine | Admitting: Emergency Medicine

## 2019-04-23 ENCOUNTER — Encounter (HOSPITAL_COMMUNITY): Payer: Medicare Other

## 2019-04-23 DIAGNOSIS — J986 Disorders of diaphragm: Secondary | ICD-10-CM | POA: Diagnosis not present

## 2019-04-23 NOTE — Progress Notes (Signed)
I have reviewed a Home Exercise Prescription with Whitney [name] . Whitney [name] is not currently exercising at home.  The patient was advised to walk 2 days a week for 30-45 minutes.  Whitney [name] and I discussed how to progress their exercise prescription.  The patient stated that their goals were to improve stamina while walking.  The patient stated that they understand the exercise prescription.  We reviewed exercise guidelines, target heart rate during exercise, RPE Scale, weather conditions, NTG use, endpoints for exercise, warmup and cool down.  Patient is encouraged to come to me with any questions. I will continue to follow up with the patient to assist them with progression and safety.    Whitney [name], M.S., ACSM CEP

## 2019-04-23 NOTE — Progress Notes (Signed)
Daily Session Note  Patient Details  Name: Whitney [name] MRN: [id number] Date of Birth: 01/05/1956 Referring Provider:     Pulmonary Rehab Walk Test from 03/11/2019 in [facility]  Referring Provider  Dr. Byrum      Encounter Date: 04/23/2019  Check In: Session Check In - 04/23/19 1042      Check-In   Supervising physician immediately available to respond to emergencies  [facility] immediately available    Physician(s)  Dr. Ghimire    Location  [facility]    Staff Present  Joan Behrens, RN, BSN;Dalton Fletcher, MS, Exercise Physiologist;Lisa Hughes, RN    Virtual Visit  No    Medication changes reported      No    Fall or balance concerns reported     No    Tobacco Cessation  No Change    Warm-up and Cool-down  Performed on first and last piece of equipment    Resistance Training Performed  Yes    VAD Patient?  No    PAD/SET Patient?  No      Pain Assessment   Currently in Pain?  No/denies    Multiple Pain Sites  No       Capillary Blood Glucose: No results found for this or any previous visit (from the past 24 hour(s)).    Social History   Tobacco Use  Smoking Status Never Smoker  Smokeless Tobacco Never Used    Goals Met:  Exercise tolerated well No report of cardiac concerns or symptoms Strength training completed today  Goals Unmet:  Not Applicable  Comments: Service time is from 1023 to 1128    Dr. Wesam G. Yacoub is Medical Director for Pulmonary Rehab at [facility]. 

## 2019-04-28 ENCOUNTER — Encounter (HOSPITAL_COMMUNITY): Payer: Medicare Other

## 2019-04-30 ENCOUNTER — Encounter (HOSPITAL_COMMUNITY): Payer: Medicare Other

## 2019-05-05 ENCOUNTER — Other Ambulatory Visit: Payer: Self-pay

## 2019-05-05 ENCOUNTER — Encounter (HOSPITAL_COMMUNITY): Payer: Medicare Other

## 2019-05-05 ENCOUNTER — Encounter (HOSPITAL_COMMUNITY)
Admission: RE | Admit: 2019-05-05 | Discharge: 2019-05-05 | Disposition: A | Discharge status: Home / Self Care | Payer: Medicare Other | Source: Ambulatory Visit | Attending: Emergency Medicine | Admitting: Emergency Medicine

## 2019-05-05 VITALS — Wt 220.5 lb

## 2019-05-05 DIAGNOSIS — J986 Disorders of diaphragm: Secondary | ICD-10-CM | POA: Diagnosis not present

## 2019-05-05 DIAGNOSIS — Z952 Presence of prosthetic heart valve: Secondary | ICD-10-CM

## 2019-05-05 NOTE — Progress Notes (Signed)
Daily Session Note  Patient Details  Name: Whitney [name] MRN: [id number] Date of Birth: [birth year] Referring Provider:     Pulmonary Rehab Walk Test from 03/11/2019 in [place]  Referring Provider  Dr. Lamonte Sakai      Encounter Date: 05/05/2019  Check In: Session Check In - 05/05/19 1115      Check-In   Supervising physician immediately available to respond to emergencies  [facility] immediately available    Physician(s)  Dr. Nevada Crane    Location  [facility]    Staff Present  Rosebud Poles, RN, Bjorn Loser, MS, Exercise Physiologist;Lisa Ysidro Evert, RN    Virtual Visit  No    Medication changes reported      No    Fall or balance concerns reported     No    Tobacco Cessation  No Change    Warm-up and Cool-down  Performed on first and last piece of equipment    Resistance Training Performed  Yes    VAD Patient?  No    PAD/SET Patient?  No      Pain Assessment   Currently in Pain?  No/denies    Pain Score  0-No pain    Multiple Pain Sites  No       Capillary Blood Glucose: No results found for this or any previous visit (from the past 24 hour(s)).  Exercise Prescription Changes - 05/05/19 1100      Response to Exercise   Blood Pressure (Admit)  126/74    Blood Pressure (Exercise)  148/78    Blood Pressure (Exit)  112/82    Heart Rate (Admit)  88 bpm    Heart Rate (Exercise)  98 bpm    Heart Rate (Exit)  95 bpm    Oxygen Saturation (Admit)  94 %    Oxygen Saturation (Exercise)  92 %    Oxygen Saturation (Exit)  94 %    Rating of Perceived Exertion (Exercise)  12    Perceived Dyspnea (Exercise)  3    Duration  Continue with 30 min of aerobic exercise without signs/symptoms of physical distress.    Intensity  THRR unchanged      Progression   Progression  Continue to progress workloads to maintain intensity without signs/symptoms of physical distress.      Resistance Training   Training Prescription  Yes    Weight  orange bands    Reps  10-15    Time  10 Minutes      Recumbant Bike   Level  2    Minutes  15      NuStep   Level  3    SPM  80    Minutes  15    METs  1.8       Social History   Tobacco Use  Smoking Status Never Smoker  Smokeless Tobacco Never Used    Goals Met:  Independence with exercise equipment Exercise tolerated well Strength training completed today  Goals Unmet:  Not Applicable  Comments: Service time is from 0955 to 1054    Dr. Rush Farmer is Medical Director for Pulmonary Rehab at [facility].

## 2019-05-06 ENCOUNTER — Other Ambulatory Visit: Payer: Self-pay | Admitting: Gastroenterology

## 2019-05-06 DIAGNOSIS — K744 Secondary biliary cirrhosis: Secondary | ICD-10-CM

## 2019-05-07 ENCOUNTER — Encounter (HOSPITAL_COMMUNITY)
Admission: RE | Admit: 2019-05-07 | Discharge: 2019-05-07 | Disposition: A | Discharge status: Home / Self Care | Payer: Medicare Other | Source: Ambulatory Visit | Attending: Emergency Medicine | Admitting: Emergency Medicine

## 2019-05-07 ENCOUNTER — Other Ambulatory Visit: Payer: Self-pay

## 2019-05-07 ENCOUNTER — Encounter (HOSPITAL_COMMUNITY): Payer: Medicare Other

## 2019-05-07 DIAGNOSIS — J986 Disorders of diaphragm: Secondary | ICD-10-CM | POA: Diagnosis not present

## 2019-05-12 ENCOUNTER — Encounter (HOSPITAL_COMMUNITY): Payer: Medicare Other

## 2019-05-14 ENCOUNTER — Encounter (HOSPITAL_COMMUNITY): Payer: Medicare Other

## 2019-05-15 ENCOUNTER — Other Ambulatory Visit: Payer: Medicare Other

## 2019-05-15 ENCOUNTER — Other Ambulatory Visit: Payer: Self-pay

## 2019-05-15 ENCOUNTER — Ambulatory Visit
Admission: RE | Admit: 2019-05-15 | Discharge: 2019-05-15 | Disposition: A | Discharge status: Home / Self Care | Payer: Medicare Other | Source: Ambulatory Visit | Attending: Gastroenterology | Admitting: Gastroenterology

## 2019-05-15 DIAGNOSIS — K744 Secondary biliary cirrhosis: Secondary | ICD-10-CM

## 2019-05-15 MED ORDER — IOPAMIDOL (ISOVUE-300) INJECTION 61%
100.0000 mL | Freq: Once | INTRAVENOUS | Status: AC | PRN
  Administered 2019-05-15: 100 mL via INTRAVENOUS

## 2019-05-19 ENCOUNTER — Encounter (HOSPITAL_COMMUNITY): Payer: Medicare Other

## 2019-05-19 NOTE — Progress Notes (Signed)
Discharge Progress Report  Patient Details  Name: Whitney [name] MRN: [id number] Date of Birth: 11/06/1955 Referring Provider:     Pulmonary Rehab Walk Test from 03/11/2019 in [facility]  Referring Provider  Dr. Byrum       Number of Visits: 10  Reason for Discharge:  Patient reached a stable level of exercise. Patient independent in their exercise. Patient has met program and personal goals.  Smoking History:  Social History   Tobacco Use  Smoking Status Never Smoker  Smokeless Tobacco Never Used    Diagnosis:  No diagnosis found.  ADL [facility]: Pulmonary Assessment Scores    Row Name 03/11/19 1153 03/12/19 0701 05/07/19 1110     ADL [facility]   ADL Phase  --  --  Exit   SOB Score total  44  --  56     CAT Score   CAT Score  16  --  21     mMRC Score   mMRC Score  --  2  2      Initial Exercise Prescription: Initial Exercise Prescription - 03/12/19 0700      Date of Initial Exercise RX and Referring Provider   Date  03/11/19    Referring Provider  Dr. Byrum      Recumbant Bike   Level  1    Minutes  15      NuStep   Level  2    SPM  80    Minutes  15      Prescription Details   Frequency (times per week)  2    Duration  Progress to 30 minutes of continuous aerobic without signs/symptoms of physical distress      Intensity   THRR 40-80% of Max Heartrate  62-124    Ratings of Perceived Exertion  11-13    Perceived Dyspnea  0-4      Progression   Progression  Continue progressive overload as per policy without signs/symptoms or physical distress.      Resistance Training   Training Prescription  Yes    Weight  orange bands    Reps  10-15       Discharge Exercise Prescription (Final Exercise Prescription Changes): Exercise Prescription Changes - 05/05/19 1100      Response to Exercise   Blood Pressure (Admit)  126/74    Blood Pressure (Exercise)  148/78    Blood Pressure (Exit)  112/82    Heart Rate (Admit)  88  bpm    Heart Rate (Exercise)  98 bpm    Heart Rate (Exit)  95 bpm    Oxygen Saturation (Admit)  94 %    Oxygen Saturation (Exercise)  92 %    Oxygen Saturation (Exit)  94 %    Rating of Perceived Exertion (Exercise)  12    Perceived Dyspnea (Exercise)  3    Duration  Continue with 30 min of aerobic exercise without signs/symptoms of physical distress.    Intensity  THRR unchanged      Progression   Progression  Continue to progress workloads to maintain intensity without signs/symptoms of physical distress.      Resistance Training   Training Prescription  Yes    Weight  orange bands    Reps  10-15    Time  10 Minutes      Recumbant Bike   Level  2    Minutes  15      NuStep   Level    3    SPM  80    Minutes  15    METs  1.8       Functional Capacity: 6 Minute Walk    Row Name 03/11/19 1230 05/07/19 1106       6 Minute Walk   Phase  Initial  Discharge    Distance  1185 feet  1279 feet    Distance % Change  --  7.93 %    Distance Feet Change  --  96 ft    Walk Time  6 minutes  6 minutes    # of Rest Breaks  0  0    MPH  2.24  2.42    METS  2.64  2.77    RPE  12  11    Perceived Dyspnea   2  2    VO2 Peak  9.25  9.68    Symptoms  No  No    Resting HR  81 bpm  81 bpm    Resting BP  126/62  126/74    Resting Oxygen Saturation   96 %  97 %    Exercise Oxygen Saturation  during 6 min walk  88 %  89 %    Max Ex. HR  113 bpm  114 bpm    Max Ex. BP  154/88  144/82    2 Minute Post BP  126/80  112/78      Interval HR   1 Minute HR  99  95    2 Minute HR  102  114    3 Minute HR  102  108    4 Minute HR  105  108    5 Minute HR  108  110    6 Minute HR  113  111    2 Minute Post HR  87  91    Interval Heart Rate?  Yes  Yes      Interval Oxygen   Interval Oxygen?  Yes  Yes    Baseline Oxygen Saturation %  96 %  97 %    1 Minute Oxygen Saturation %  92 %  96 %    1 Minute Liters of Oxygen  0 L  0 L    2 Minute Oxygen Saturation %  88 %  93 %    2 Minute  Liters of Oxygen  0 L  0 L    3 Minute Oxygen Saturation %  89 %  91 %    3 Minute Liters of Oxygen  0 L  0 L    4 Minute Oxygen Saturation %  91 %  89 %    4 Minute Liters of Oxygen  0 L  0 L    5 Minute Oxygen Saturation %  89 %  90 %    5 Minute Liters of Oxygen  0 L  0 L    6 Minute Oxygen Saturation %  89 %  89 %    6 Minute Liters of Oxygen  0 L  0 L    2 Minute Post Oxygen Saturation %  95 %  97 %    2 Minute Post Liters of Oxygen  0 L  0 L       Psychological, QOL, Others - Outcomes: PHQ 2/9: Depression screen PHQ 2/9 03/11/2019 03/11/2019 04/28/2018 02/12/2018  Decreased Interest 1 1 0 0  Down, Depressed, Hopeless 1 1 0 0  PHQ - 2 Score 2 2   0 0  Altered sleeping 1 1 - -  Tired, decreased energy 1 1 - -  Change in appetite 0 0 - -  Feeling bad or failure about yourself  0 0 - -  Trouble concentrating 0 0 - -  Moving slowly or fidgety/restless 0 0 - -  Suicidal thoughts 0 0 - -  PHQ-9 Score 4 4 - -  Difficult doing work/chores Not difficult at all - - -  Some recent data might be hidden    Quality of Life:   Personal Goals: Goals established at orientation with interventions provided to work toward goal. Personal Goals and Risk Factors at Admission - 03/23/19 0929      Core Components/Risk Factors/Patient Goals on Admission    Weight Management  Weight Loss    Improve shortness of breath with ADL's  Yes    Intervention  Provide education, individualized exercise plan and daily activity instruction to help decrease symptoms of SOB with activities of daily living.    Expected Outcomes  Short Term: Improve cardiorespiratory fitness to achieve a reduction of symptoms when performing ADLs;Long Term: Be able to perform more ADLs without symptoms or delay the onset of symptoms        Personal Goals Discharge: Goals and Risk Factor Review    Row Name 03/23/19 0931 04/20/19 0952           Core Components/Risk Factors/Patient Goals Review   Personal Goals Review   Weight Management/Obesity;Develop more efficient breathing techniques such as purse lipped breathing and diaphragmatic breathing and practicing self-pacing with activity.;Improve shortness of breath with ADL's  Weight Management/Obesity;Develop more efficient breathing techniques such as purse lipped breathing and diaphragmatic breathing and practicing self-pacing with activity.;Increase knowledge of respiratory medications and ability to use respiratory devices properly.;Improve shortness of breath with ADL's;Lipids      Review  [name] just started pulmonary rehab, has attended 2 exercise sessions, it is too early to have achieved any program goals.  [name] has not lost any weight, her attendance has been irregular due to dealing with family issues.  She is exercising on the nustep at level 3, and the recumbent bike on level 1.5 and is tolerating it well, her lipids are stable.      Expected Outcomes  In the next full 30 days she will have lost some weight and her shortness of breath will have improved somewhat and her workloads will be increased as tolerated.  See admission goals.         Exercise Goals and Review: Exercise Goals    Row Name 03/11/19 1332 03/12/19 0705 03/24/19 0654 04/20/19 1100       Exercise Goals   Increase Physical Activity  Yes  Yes  Yes  Yes    Intervention  Provide advice, education, support and counseling about physical activity/exercise needs.;Develop an individualized exercise prescription for aerobic and resistive training based on initial evaluation findings, risk stratification, comorbidities and participant's personal goals.  Provide advice, education, support and counseling about physical activity/exercise needs.;Develop an individualized exercise prescription for aerobic and resistive training based on initial evaluation findings, risk stratification, comorbidities and participant's personal goals.  Provide advice, education, support and counseling about physical  activity/exercise needs.;Develop an individualized exercise prescription for aerobic and resistive training based on initial evaluation findings, risk stratification, comorbidities and participant's personal goals.  Provide advice, education, support and counseling about physical activity/exercise needs.;Develop an individualized exercise prescription for aerobic and resistive training based on initial evaluation findings, risk stratification,  comorbidities and participant's personal goals.    Expected Outcomes  Long Term: Exercising regularly at least 3-5 days a week.;Long Term: Add in home exercise to make exercise part of routine and to increase amount of physical activity.;Short Term: Attend rehab on a regular basis to increase amount of physical activity.  Long Term: Exercising regularly at least 3-5 days a week.;Long Term: Add in home exercise to make exercise part of routine and to increase amount of physical activity.;Short Term: Attend rehab on a regular basis to increase amount of physical activity.  Long Term: Exercising regularly at least 3-5 days a week.;Long Term: Add in home exercise to make exercise part of routine and to increase amount of physical activity.;Short Term: Attend rehab on a regular basis to increase amount of physical activity.  Long Term: Exercising regularly at least 3-5 days a week.;Long Term: Add in home exercise to make exercise part of routine and to increase amount of physical activity.;Short Term: Attend rehab on a regular basis to increase amount of physical activity.    Increase Strength and Stamina  Yes  Yes  Yes  Yes    Intervention  Provide advice, education, support and counseling about physical activity/exercise needs.;Develop an individualized exercise prescription for aerobic and resistive training based on initial evaluation findings, risk stratification, comorbidities and participant's personal goals.  Provide advice, education, support and counseling about  physical activity/exercise needs.;Develop an individualized exercise prescription for aerobic and resistive training based on initial evaluation findings, risk stratification, comorbidities and participant's personal goals.  Provide advice, education, support and counseling about physical activity/exercise needs.;Develop an individualized exercise prescription for aerobic and resistive training based on initial evaluation findings, risk stratification, comorbidities and participant's personal goals.  Provide advice, education, support and counseling about physical activity/exercise needs.;Develop an individualized exercise prescription for aerobic and resistive training based on initial evaluation findings, risk stratification, comorbidities and participant's personal goals.    Expected Outcomes  Short Term: Increase workloads from initial exercise prescription for resistance, speed, and METs.;Short Term: Perform resistance training exercises routinely during rehab and add in resistance training at home;Long Term: Improve cardiorespiratory fitness, muscular endurance and strength as measured by increased METs and functional capacity (6MWT)  Short Term: Increase workloads from initial exercise prescription for resistance, speed, and METs.;Short Term: Perform resistance training exercises routinely during rehab and add in resistance training at home;Long Term: Improve cardiorespiratory fitness, muscular endurance and strength as measured by increased METs and functional capacity (6MWT)  Short Term: Increase workloads from initial exercise prescription for resistance, speed, and METs.;Short Term: Perform resistance training exercises routinely during rehab and add in resistance training at home;Long Term: Improve cardiorespiratory fitness, muscular endurance and strength as measured by increased METs and functional capacity (6MWT)  Short Term: Increase workloads from initial exercise prescription for resistance,  speed, and METs.;Short Term: Perform resistance training exercises routinely during rehab and add in resistance training at home;Long Term: Improve cardiorespiratory fitness, muscular endurance and strength as measured by increased METs and functional capacity (6MWT)    Able to understand and use rate of perceived exertion (RPE) scale  Yes  Yes  Yes  Yes    Intervention  Provide education and explanation on how to use RPE scale  Provide education and explanation on how to use RPE scale  Provide education and explanation on how to use RPE scale  Provide education and explanation on how to use RPE scale    Expected Outcomes  Short Term: Able to use RPE daily in  rehab to express subjective intensity level;Long Term:  Able to use RPE to guide intensity level when exercising independently  Short Term: Able to use RPE daily in rehab to express subjective intensity level;Long Term:  Able to use RPE to guide intensity level when exercising independently  Short Term: Able to use RPE daily in rehab to express subjective intensity level;Long Term:  Able to use RPE to guide intensity level when exercising independently  Short Term: Able to use RPE daily in rehab to express subjective intensity level;Long Term:  Able to use RPE to guide intensity level when exercising independently    Able to understand and use Dyspnea scale  Yes  Yes  Yes  Yes    Intervention  Provide education and explanation on how to use Dyspnea scale  Provide education and explanation on how to use Dyspnea scale  Provide education and explanation on how to use Dyspnea scale  Provide education and explanation on how to use Dyspnea scale    Expected Outcomes  Short Term: Able to use Dyspnea scale daily in rehab to express subjective sense of shortness of breath during exertion;Long Term: Able to use Dyspnea scale to guide intensity level when exercising independently  Short Term: Able to use Dyspnea scale daily in rehab to express subjective sense of  shortness of breath during exertion;Long Term: Able to use Dyspnea scale to guide intensity level when exercising independently  Short Term: Able to use Dyspnea scale daily in rehab to express subjective sense of shortness of breath during exertion;Long Term: Able to use Dyspnea scale to guide intensity level when exercising independently  Short Term: Able to use Dyspnea scale daily in rehab to express subjective sense of shortness of breath during exertion;Long Term: Able to use Dyspnea scale to guide intensity level when exercising independently    Knowledge and understanding of Target Heart Rate Range (THRR)  Yes  Yes  Yes  Yes    Intervention  Provide education and explanation of THRR including how the numbers were predicted and where they are located for reference  Provide education and explanation of THRR including how the numbers were predicted and where they are located for reference  Provide education and explanation of THRR including how the numbers were predicted and where they are located for reference  Provide education and explanation of THRR including how the numbers were predicted and where they are located for reference    Expected Outcomes  Short Term: Able to state/look up THRR;Long Term: Able to use THRR to govern intensity when exercising independently;Short Term: Able to use daily as guideline for intensity in rehab  Short Term: Able to state/look up THRR;Long Term: Able to use THRR to govern intensity when exercising independently;Short Term: Able to use daily as guideline for intensity in rehab  Short Term: Able to state/look up THRR;Long Term: Able to use THRR to govern intensity when exercising independently;Short Term: Able to use daily as guideline for intensity in rehab  Short Term: Able to state/look up THRR;Long Term: Able to use THRR to govern intensity when exercising independently;Short Term: Able to use daily as guideline for intensity in rehab    Understanding of Exercise  Prescription  Yes  Yes  Yes  Yes    Intervention  Provide education, explanation, and written materials on patient's individual exercise prescription  Provide education, explanation, and written materials on patient's individual exercise prescription  Provide education, explanation, and written materials on patient's individual exercise prescription  Provide education, explanation,   and written materials on patient's individual exercise prescription    Expected Outcomes  Short Term: Able to explain program exercise prescription;Long Term: Able to explain home exercise prescription to exercise independently  Short Term: Able to explain program exercise prescription;Long Term: Able to explain home exercise prescription to exercise independently  Short Term: Able to explain program exercise prescription;Long Term: Able to explain home exercise prescription to exercise independently  Short Term: Able to explain program exercise prescription;Long Term: Able to explain home exercise prescription to exercise independently       Exercise Goals Re-Evaluation: Exercise Goals Re-Evaluation    Row Name 03/24/19 0655 04/20/19 1100           Exercise Goal Re-Evaluation   Exercise Goals Review  Increase Physical Activity;Increase Strength and Stamina;Able to understand and use rate of perceived exertion (RPE) scale;Able to understand and use Dyspnea scale;Knowledge and understanding of Target Heart Rate Range (THRR);Understanding of Exercise Prescription  Increase Physical Activity;Increase Strength and Stamina;Able to understand and use rate of perceived exertion (RPE) scale;Able to understand and use Dyspnea scale;Knowledge and understanding of Target Heart Rate Range (THRR);Understanding of Exercise Prescription      Comments  Pt has attended 2 exercise sessions. Pt currently exercises at 2.0 METs on the stepper. Pt had previously completed cardiac rehab and made improvements in her functional capacity. I fully  expect pt to make similar improvements. Will continue to monitor and progress as able.  Pt has attended 6 exercise sessions. Pt continues to exercise at 2.0 METs on the stepper. Will continue to monitor and progress as able.      Expected Outcomes  Through exercise at rehab and at home, the patient will decrease shortness of breath with daily activities and feel confident in carrying out an exercise regime at home.  Through exercise at rehab and at home, the patient will decrease shortness of breath with daily activities and feel confident in carrying out an exercise regime at home.         Nutrition & Weight - Outcomes: Pre Biometrics - 03/11/19 1130      Pre Biometrics   Grip Strength  30 kg        Nutrition: Nutrition Therapy & Goals - 03/24/19 1110      Nutrition Therapy   Diet  Therapeutic Lifestyle Changes      Personal Nutrition Goals   Nutrition Goal  Pt to identify food quantities necessary to achieve weight loss of 6-24 lbs. at graduation from cardiac rehab. Goal wt loss of 20 lb desired.    Personal Goal #2  Pt to increase protein intake for 1.5 g/kg body weight goal      Intervention Plan   Intervention  Prescribe, educate and counsel regarding individualized specific dietary modifications aiming towards targeted core components such as weight, hypertension, lipid management, diabetes, heart failure and other comorbidities.;Nutrition handout(s) given to patient.    Expected Outcomes  Short Term Goal: A plan has been developed with personal nutrition goals set during dietitian appointment.;Long Term Goal: Adherence to prescribed nutrition plan.       Nutrition Discharge: Nutrition Assessments - 05/07/19 1057      Rate Your Plate Scores   Post Score  54       Education Questionnaire Score: Knowledge Questionnaire Score - 05/07/19 1111      Knowledge Questionnaire Score   Post Score  16/18       Goals reviewed with patient; copy given to patient.

## 2019-05-21 ENCOUNTER — Encounter (HOSPITAL_COMMUNITY): Payer: Medicare Other

## 2019-05-28 ENCOUNTER — Ambulatory Visit: Payer: Medicare PPO | Attending: Internal Medicine

## 2019-05-28 DIAGNOSIS — Z23 Encounter for immunization: Secondary | ICD-10-CM | POA: Insufficient documentation

## 2019-05-28 NOTE — Progress Notes (Signed)
   Covid-19 Vaccination Clinic  Name:  Whitney [name]    MRN: [id number] DOB: 03/06/1956  05/28/2019  [name] was observed post Covid-19 immunization for 15 minutes without incidence. She was provided with Vaccine Information Sheet and instruction to access the V-Safe system.   [name] was instructed to call 911 with any severe reactions post vaccine: Marland Kitchen Difficulty breathing  . Swelling of your face and throat  . A fast heartbeat  . A bad rash all over your body  . Dizziness and weakness    Immunizations Administered    Name Date Dose VIS Date Route   Pfizer COVID-19 Vaccine 05/28/2019  2:57 PM 0.3 mL 04/17/2019 Intramuscular   Manufacturer: [place] [place]   Lot: [id number]   [place]: [id number]

## 2019-06-15 DIAGNOSIS — I272 Pulmonary hypertension, unspecified: Secondary | ICD-10-CM | POA: Diagnosis present

## 2019-06-17 ENCOUNTER — Ambulatory Visit: Payer: Medicare PPO | Attending: Internal Medicine

## 2019-06-17 DIAGNOSIS — Z23 Encounter for immunization: Secondary | ICD-10-CM | POA: Insufficient documentation

## 2019-06-17 NOTE — Progress Notes (Signed)
   Covid-19 Vaccination Clinic  Name:  Whitney [name]    MRN: [id number] DOB: 1956-02-03  06/17/2019  Whitney [name] was observed post Covid-19 immunization for 15 minutes without incidence. She was provided with Vaccine Information Sheet and instruction to access the V-Safe system.   Whitney [name] was instructed to call 911 with any severe reactions post vaccine: Marland Kitchen Difficulty breathing  . Swelling of your face and throat  . A fast heartbeat  . A bad rash all over your body  . Dizziness and weakness    Immunizations Administered    Name Date Dose VIS Date Route   Pfizer COVID-19 Vaccine 06/17/2019  3:00 PM 0.3 mL 04/17/2019 Intramuscular   Manufacturer: [employer]   Lot: [id number]   [place]: [id number]

## 2019-06-18 ENCOUNTER — Ambulatory Visit: Payer: Medicare PPO

## 2019-07-13 NOTE — Progress Notes (Signed)
Cardiology Office Note   Date:  07/14/2019   ID:  Whitney [name], DOB 12/09/1955, MRN [id number]  PCP:  Hulan Fess, MD    No chief complaint on file.  Status post aortic valve replacement  Wt Readings from Last 3 Encounters:  07/14/19 221 lb 12.8 oz (100.6 kg)  05/05/19 220 lb 7.4 oz (100 kg)  04/21/19 221 lb 5.5 oz (100.4 kg)       History of Present Illness: Whitney [name] is a 64 y.o. female   with a past medical history significant for NASHand chronic thrombocytopeniatreated at [facility], gastric sleeve surgery 2015 with 100 lb wt loss, aortic stenosis s/p tissue AVR 12/05/2017, DM type 2, hyperlipidemia.  Ms Pusch had known aortic stenosis that had progressed with symptoms of exertional fatigue andtiredness. Echocardiogram in April 2019 showed a mean gradient of 41 mmHg and a peak gradient of 80 mmHg.LV systolic function was normal. She was evaluated by her liver specialist who felt there was no contraindication to proceeding with an aortic valve replacement. She underwent tissue AVR on 12/05/2017.She developed brief runs of atrial fibrillation postoperatively.She chemically converted back to sinus rhythm with amiodarone and Lopressor was increased to 25 mg twice daily. She had postoperative volume overload which is improved. She has chronic thrombocytopenia and platelets were stable at 54,000 in the hospital.  Since the last visit, she had intermittent shortness of breath.  She has had paralysis of the right hemidiaphragm.   She thinks this happened at the time of the chest tube removal.    She still has intermittent shortness of breath.  There was a concern of portopulmonary HTN, Sjogrens.  She has gained weight as well.  She got her COVID vaccines.   Past Medical History:  Diagnosis Date  . Allergic rhinitis   . Anemia    hx  . Arthritis   . Asthma    hx yrs ago  . Cirrhosis ([place]) last albumin 3.3 done at [facility] 06-16-2014 (under care everywhere tab in epic)   Secondary to Fatty liver --  followed by hepatology at [facility] (dr Gerald Dexter)   . Depression   . Diabetes mellitus type II    type 2 diet conrolled  . Dyspnea   . Fibromyalgia   . GERD (gastroesophageal reflux disease)   . Heart murmur    asymptomatic ---  1989 from rhuematic fever  . History of exercise stress test    05-05-2013---  negative bruce ETT given exercise workload,  no ischemia  . History of hiatal hernia   . History of kidney stones   . History of rheumatic fever    1989  . Hyperlipidemia   . Leukocytopenia   . Moderate aortic stenosis    AVA area 1.1cm2---  cardiologist --  dr Concepcion Living, 2014 in epic  . NASH (nonalcoholic steatohepatitis)   . OSA (obstructive sleep apnea)    was using CPAP before gastric sleeve 2015--  no uses after wt loss  . Pneumonia    hx  . Sjogren's syndrome ([place])   . Thrombocytopenia ([place])     Past Surgical History:  Procedure Laterality Date  . AORTIC VALVE REPLACEMENT N/A 12/05/2017   Procedure: AORTIC VALVE REPLACEMENT (AVR) TISSUE VALVE 21MM INSPIRIS;  Surgeon: Gaye Pollack, MD;  Location: [place];  Service: Open Heart Surgery;  Laterality: N/A;  . COLONOSCOPY WITH ESOPHAGOGASTRODUODENOSCOPY (EGD)    . CYSTOSCOPY WITH RETROGRADE PYELOGRAM, URETEROSCOPY AND STENT PLACEMENT Left 01/21/2015   Procedure:  CYSTOSCOPY WITH LEFT  RETROGRADE PYELOGRAM, LEFT URETEROSCOPY AND STENT PLACEMENT;  Surgeon: Festus Aloe, MD;  Location: [facility];  Service: Urology;  Laterality: Left;  . CYSTOSCOPY WITH RETROGRADE PYELOGRAM, URETEROSCOPY AND STENT PLACEMENT Bilateral 02/24/2015   Procedure: CYSTOSCOPY WITH RIGHT RETROGRADE PYELOGRAM, BLADDER BIOPSY FULGERATION LEFT URETEROSCOPY AND STENT REPLACEMENT;  Surgeon: Festus Aloe, MD;  Location: [facility];  Service: Urology;  Laterality: Bilateral;  . EXPLORATORY LAPAROSCOPY W/  CONE BIOPSY'S LEFT AND RIGHT LOBE OF LIVER  11-04-2007  . HOLMIUM LASER APPLICATION Left  67/20/9470   Procedure: HOLMIUM LASER LITHOTRIPSY;  Surgeon: Festus Aloe, MD;  Location: [facility];  Service: Urology;  Laterality: Left;  . HYSTEROSCOPY WITH D & C N/A 12/11/2012   Procedure: DILATATION AND CURETTAGE /HYSTEROSCOPY;  Surgeon: Maeola Sarah. Landry Mellow, MD;  Location: [facility];  Service: Gynecology;  Laterality: N/A;  . INGUINAL HERNIA REPAIR Left 10/22/2016   Procedure: LEFT INGUINAL HERNIA REPAIR;  Surgeon: Rolm Bookbinder, MD;  Location: [place];  Service: General;  Laterality: Left;  TAP BLOCK  . INSERTION OF MESH Left 10/22/2016   Procedure: INSERTION OF MESH;  Surgeon: Rolm Bookbinder, MD;  Location: [place];  Service: General;  Laterality: Left;  . LAPAROSCOPIC GASTRIC SLEEVE RESECTION  07-27-2013  . PUBOVAGINAL SLING  04-10-2001   [place]  . RIGHT/LEFT HEART CATH AND CORONARY ANGIOGRAPHY N/A 08/23/2017   Procedure: RIGHT/LEFT HEART CATH AND CORONARY ANGIOGRAPHY;  Surgeon: [name], MD;  Location: [place] [facility];  Service: Cardiovascular;  Laterality: N/A;  . TEE WITHOUT CARDIOVERSION N/A 12/05/2017   Procedure: TRANSESOPHAGEAL ECHOCARDIOGRAM (TEE);  Surgeon: Gaye Pollack, MD;  Location: [place];  Service: Open Heart Surgery;  Laterality: N/A;  . TONSILLECTOMY  1975  . TRANSTHORACIC ECHOCARDIOGRAM  06-04-2012  dr Irish Lack   mild LVH,  grade I diastolic dysfunction/  ef 60-65%/  moderate LAE/  mild MV calcifation without stenosis,  mild MR/  moderate AV stenosis,  cannot r/o bicupsid, area 1.1cm2/  mild dilated aortic root/  trivial TR     Current Outpatient Medications  Medication Sig Dispense Refill  . albuterol (VENTOLIN HFA) 108 (90 Base) MCG/ACT inhaler Inhale 2 puffs into the lungs every 4 (four) hours as needed for wheezing or shortness of breath. 18 g 5  . aspirin EC 81 MG EC tablet Take 1 tablet (81 mg total) by mouth daily.    . cyanocobalamin 100 MCG tablet Take by mouth.    . ezetimibe (ZETIA) 10 MG tablet Take 10 mg by mouth at bedtime.    11  . milk thistle 175 MG tablet Take 175 mg by mouth daily.    Marland Kitchen OVER THE COUNTER MEDICATION Take 1 packet by mouth 2 (two) times daily. Bariatric Multivitamin    . pyridOXINE (B-6) 50 MG tablet Take by mouth.     No current facility-administered medications for this visit.    Allergies:   Doxycycline and Naproxen    Social History:  The patient  reports that she has never smoked. She has never used smokeless tobacco. She reports current alcohol use. She reports that she does not use drugs.   Family History:  The patient's family history includes Allergies in her daughter; Alzheimer's disease in her father; Asthma in her daughter, father, and paternal grandmother; Heart attack in her father; Heart disease in her father and mother; Hip fracture in her father; Hypertension in her father; Rheum arthritis in her mother.    ROS:  Please see the history of present illness.   Otherwise, review of systems are positive for intermittent DOE.   All other systems are reviewed and negative.    PHYSICAL EXAM: VS:  BP (!) 142/82   Pulse 84   Ht 5' 4"  (1.626 m)   Wt 221 lb 12.8 oz (100.6 kg)   SpO2 93%   BMI 38.07 kg/m  , BMI Body mass index is 38.07 kg/m. GEN: Well nourished, well developed, in no acute distress  HEENT: normal  Neck: no JVD, carotid bruits, or masses Cardiac: RRR; 2/6 systolic murmur; 2/6 holodiastolic murmur, no rubs, or gallops,no edema  Respiratory:  clear to auscultation bilaterally, normal work of breathing GI: soft, nontender, nondistended, + BS, obese MS: no deformity or atrophy  Skin: warm and dry, no rash Neuro:  Strength and sensation are intact Psych: euthymic mood, full affect   EKG:   The ekg ordered today demonstrates    Recent Labs: 12/17/2018: ALT 13; BUN 18; Creatinine, Ser 0.80; Hemoglobin 16.0; Platelets 79; Potassium 3.9; Sodium 141   Lipid Panel No results found for: CHOL, TRIG, HDL, CHOLHDL, VLDL, LDLCALC, LDLDIRECT   Other studies  Reviewed: Additional studies/ records that were reviewed today with results demonstrating: records from [facility].   ASSESSMENT AND PLAN:  1. S/p AVR: Continue SBE prophylaxis.  Using amoxicillin. 2. PAF: Has been off of amiodarone.  No palpitations.  3. Hyperlipidemia: No statin due to NASH.  On Zetia.  Whole food, plant-based diet discussed.  Regular exercise discussed as well. 4. Chronic thrombocytopenia: She has had mild bruising in the past. 5. Shortness of breath: Multiple possible etiologies including pulmonary hypertension related to her liver disease, autoimmune disease.  I reviewed the records from [place].  CPX testing was considered as well as right heart catheterization.  Will get input from Dr. Lamonte Sakai as well to see how best to schedule the CPX test.  We can arrange potentially in a heart failure clinic as well.  I encouraged her to try to increase her exercise gradually.  She states she does not like to exercise.  She does not go out for regular walks.  I think just some routine activity would be helpful to try to increase her stamina.   Current medicines are reviewed at length with the patient today.  The patient concerns regarding her medicines were addressed.  The following changes have been made:  No change  Labs/ tests ordered today include:  No orders of the defined types were placed in this encounter.   Recommend 150 minutes/week of aerobic exercise Low fat, low carb, high fiber diet recommended  Disposition:   FU in 1 year, earlier for CPX testing   Signed, Larae Grooms, MD  07/14/2019 9:55 AM    [facility] [place], [place], [place]  [address] Phone: [phone]; Fax: [phone]

## 2019-07-14 ENCOUNTER — Encounter: Payer: Self-pay | Admitting: Interventional Cardiology

## 2019-07-14 ENCOUNTER — Other Ambulatory Visit: Payer: Self-pay

## 2019-07-14 ENCOUNTER — Ambulatory Visit: Payer: Medicare PPO | Admitting: Interventional Cardiology

## 2019-07-14 VITALS — BP 142/82 | HR 84 | Ht 64.0 in | Wt 221.8 lb

## 2019-07-14 DIAGNOSIS — Z953 Presence of xenogenic heart valve: Secondary | ICD-10-CM | POA: Diagnosis not present

## 2019-07-14 DIAGNOSIS — E785 Hyperlipidemia, unspecified: Secondary | ICD-10-CM

## 2019-07-14 DIAGNOSIS — E119 Type 2 diabetes mellitus without complications: Secondary | ICD-10-CM

## 2019-07-14 DIAGNOSIS — I480 Paroxysmal atrial fibrillation: Secondary | ICD-10-CM | POA: Diagnosis not present

## 2019-07-14 DIAGNOSIS — R0602 Shortness of breath: Secondary | ICD-10-CM

## 2019-07-14 NOTE — Patient Instructions (Signed)
Medication Instructions:  Your physician recommends that you continue on your current medications as directed. Please refer to the Current Medication list given to you today.  *If you need a refill on your cardiac medications before your next appointment, please call your pharmacy*   Lab Work: None ordered  If you have labs (blood work) drawn today and your tests are completely normal, you will receive your results only by: Marland Kitchen MyChart Message (if you have MyChart) OR . A paper copy in the mail If you have any lab test that is abnormal or we need to change your treatment, we will call you to review the results.   Testing/Procedures: None ordered   Follow-Up: At [facility], you and your health needs are our priority.  As part of our continuing mission to provide you with exceptional heart care, we have created designated Provider Care Teams.  These Care Teams include your primary Cardiologist (physician) and Advanced Practice Providers (APPs -  Physician Assistants and Nurse Practitioners) who all work together to provide you with the care you need, when you need it.  We recommend signing up for the patient portal called "MyChart".  Sign up information is provided on this After Visit Summary.  MyChart is used to connect with patients for Virtual Visits (Telemedicine).  Patients are able to view lab/test results, encounter notes, upcoming appointments, etc.  Non-urgent messages can be sent to your provider as well.   To learn more about what you can do with MyChart, go to [email or web address].    Your next appointment:   12 year(s)  The format for your next appointment:   In Person  Provider:   You may see Larae Grooms, MD or one of the following Advanced Practice Providers on your designated Care Team:    Melina Copa, PA-C  Ermalinda Barrios, PA-C    Other Instructions

## 2019-07-28 ENCOUNTER — Telehealth: Payer: Self-pay | Admitting: Emergency Medicine

## 2019-07-29 NOTE — Telephone Encounter (Signed)
Error

## 2019-08-12 ENCOUNTER — Other Ambulatory Visit: Payer: Self-pay

## 2019-08-12 ENCOUNTER — Ambulatory Visit: Payer: Medicare PPO | Admitting: Emergency Medicine

## 2019-08-12 ENCOUNTER — Encounter: Payer: Self-pay | Admitting: Emergency Medicine

## 2019-08-12 VITALS — BP 140/82 | HR 92 | Temp 97.3°F | Ht 64.0 in | Wt 224.2 lb

## 2019-08-12 DIAGNOSIS — G4733 Obstructive sleep apnea (adult) (pediatric): Secondary | ICD-10-CM

## 2019-08-12 DIAGNOSIS — R0602 Shortness of breath: Secondary | ICD-10-CM | POA: Diagnosis not present

## 2019-08-12 DIAGNOSIS — J984 Other disorders of lung: Secondary | ICD-10-CM

## 2019-08-12 NOTE — Patient Instructions (Signed)
We will refer you back for cardiopulmonary rehab to help with your stamina, conditioning, overall breathing status We will arrange for a split-night sleep study to reassess your obstructive sleep apnea. Agree with Dr. Gilles Chiquito at [facility]:  It is unclear right now whether pulmonary hypertension (which has many causes) is contributing significantly to your breathing difficulty.  We may decide to undertake further work-up including cardiopulmonary exercise test.  If we do obtain evidence that cardiac function is limiting you then you might benefit from a right heart catheterization. Your oxygen level does not drop when you exert yourself. Follow with Dr. Lamonte Sakai in 2 months so we can review your sleep testing, or sooner if you have any problems.

## 2019-08-12 NOTE — Progress Notes (Signed)
Subjective:    Patient ID: Whitney [name], female    DOB: [birth year], 64 y.o.   MRN: [id number]  HPI 64 year old never smoker with a history of obesity status post gastric sleeve and significant weight loss (as much as 100 pounds; has gained back 30 lbs this yr), obstructive sleep apnea (no longer on CPAP), NASH cirrhosis and chronic thrombocytopenia, aortic stenosis with a tissue AVR on 12/05/2017, diabetes, Sjogren's syndrome.  Last seen here in our office by Dr. Gwenette Greet 2013 for chronic cough felt related to LPR and rhinitis.  She had a recent ED visit this month for apparent Bell's palsy.  Chest x-ray that day 12/17/2018 reviewed, shows no evidence for infiltrate but a new elevation of her right hemidiaphragm.   She is referred today for exertional SOB. She started to notice it following her AVR. Seemed to be worst when bending. Then she recovered a bit around 05/2018. Since the beginning of the year she has put on about 30 lbs. She has had progressive dyspnea with exertion, occasional throat rattle, no real wheeze or stridor. Minimal cough although she does have some PND, throat dryness. She can walk indefinitely, but is SOB when doing it. Able to shop.   Pulmonary function testing done 12/19/2018 reviewed by me shows significant restriction with possible superimposed obstruction, borderline bronchodilator response, decreased diffusion capacity that corrects to the normal range when adjusted for alveolar volume.  Echocardiogram 12/26/2018 shows normal LV function, mild aortic insufficiency with a prosthetic valve  Remote methacholine challenge 1997 without any evidence of hyperresponsiveness.  ROV 02/12/2019 --follow-up visit for 64 year old woman with obesity, OSA (no longer on CPAP), NASH cirrhosis, aortic stenosis post tissue AVR just over 1 year ago, Sjogren's syndrome.  She has been seen here in the past for cough but I saw her a month ago for exertional shortness of breath.  She has had about 30  pounds weight gain over the interval and question whether that may be a contributor.  PFT 12/19/2018 with principally restriction as above.  She still has some upper airway noise and occasional cough, PND.  I asked her to do a therapeutic trial of albuterol to see if she would get benefit.  She reports that she hasn't really noticed any large scale change - may be a bit less SOB with speak.   A sniff test was done on 01/09/2019 that shows mild to moderate elevation of the right hemidiaphragm with severe hemiparesis/near hemiplegia of the right hemidiaphragm than during deep breathing, cough and sniff maneuver.  ROVB 08/12/19 --this is a follow-up visit for 64 year old woman with history of obesity, OSA (not on CPAP), NASH cirrhosis, aortic stenosis with an AVR, Sjogren's syndrome.  Pulmonary function testing from 12/2018 showed restrictive lung disease.  She underwent a sniff test on 01/09/2019 that showed mild to moderate elevation of right hemidiaphragm and evidence for hemiparesis.  She participated in cardiopulmonary rehab at the end of 2020, was truncated due to [place], may have helped her.   She was seen by the [facility] clinic at [facility] 06/16/2019 to further evaluate her dyspnea and and try to discern whether pulmonary hypertension may be driving progressive symptoms versus her known restrictive lung disease, deconditioning. Potential contributors to her [place] > her valvular disease, portal pressures from her underlying liver disease.  Wt stable since our last visit.   They are considering a right heart catheterization and possibly a cardiopulmonary exercise test to differentiate between cardiac and pulmonary contributions to her dyspnea. She is  concerned that she cannot walk very long - she may be able top 10 minutes at her pace.   TTE from 12/26/18 reviewed > shows normal RV size and fxn, slightly dilated RA; her estimated PASP in 05/2018 was 31 mmHg.   MDM:  Reviewed notes from [place], Dr. Gilles Chiquito from  06/16/2019 Reviewed gastroenterology notes from 08/05/2019 Reviewed echocardiogram from 12/26/2018 and 05/13/2018    Review of Systems  Constitutional: Negative for fever and unexpected weight change.  HENT: Negative for congestion, dental problem, ear pain, nosebleeds, postnasal drip, rhinorrhea, sinus pressure, sneezing, sore throat and trouble swallowing.   Eyes: Negative for redness and itching.  Respiratory: Positive for shortness of breath. Negative for cough, chest tightness and wheezing.   Cardiovascular: Negative for palpitations and leg swelling.  Gastrointestinal: Negative for nausea and vomiting.  Genitourinary: Negative for dysuria.  Musculoskeletal: Negative for joint swelling.  Skin: Negative for rash.  Neurological: Negative for headaches.  Hematological: Does not bruise/bleed easily.  Psychiatric/Behavioral: Negative for dysphoric mood. The patient is not nervous/anxious.      Past Medical History:  Diagnosis Date  . Allergic rhinitis   . Anemia    hx  . Arthritis   . Asthma    hx yrs ago  . Cirrhosis ([place]) last albumin 3.3 done at [facility] 06-16-2014 (under care everywhere tab in epic)   Secondary to Fatty liver --  followed by hepatology at [facility] (dr Gerald Dexter)   . Depression   . Diabetes mellitus type II    type 2 diet conrolled  . Dyspnea   . Fibromyalgia   . GERD (gastroesophageal reflux disease)   . Heart murmur    asymptomatic ---  1989 from rhuematic fever  . History of exercise stress test    05-05-2013---  negative bruce ETT given exercise workload,  no ischemia  . History of hiatal hernia   . History of kidney stones   . History of rheumatic fever    1989  . Hyperlipidemia   . Leukocytopenia   . Moderate aortic stenosis    AVA area 1.1cm2---  cardiologist --  dr Concepcion Living, 2014 in epic  . NASH (nonalcoholic steatohepatitis)   . OSA (obstructive sleep apnea)    was using CPAP before gastric sleeve 2015--  no uses after wt loss  . Pneumonia    hx   . Sjogren's syndrome ([place])   . Thrombocytopenia ([place])      Family History  Problem Relation Age of Onset  . Alzheimer's disease Father   . Hip fracture Father   . Asthma Father   . Heart disease Father   . Heart attack Father   . Hypertension Father   . Rheum arthritis Mother   . Heart disease Mother   . Allergies Daughter   . Asthma Paternal Grandmother   . Asthma Daughter   . Stroke Neg Hx      Social History   Socioeconomic History  . Marital status: Widowed    Spouse name: Married to [employer]  . Number of children: Not on file  . Years of education: Not on file  . Highest education level: Not on file  Occupational History  . Occupation: principal of [employer]  Tobacco Use  . Smoking status: Never Smoker  . Smokeless tobacco: Never Used  Substance and Sexual Activity  . Alcohol use: Yes    Comment: rarely  . Drug use: No  . Sexual activity: Not on file  Other Topics Concern  .  Not on file  Social History Narrative  . Not on file   Social Determinants of Health   Financial Resource Strain:   . Difficulty of Paying Living Expenses:   Food Insecurity:   . Worried About Charity fundraiser in the Last Year:   . Arboriculturist in the Last Year:   Transportation Needs:   . Film/video editor (Medical):   Marland Kitchen Lack of Transportation (Non-Medical):   Physical Activity:   . Days of Exercise per Week:   . Minutes of Exercise per Session:   Stress:   . Feeling of Stress :   Social Connections:   . Frequency of Communication with Friends and Family:   . Frequency of Social Gatherings with Friends and Family:   . Attends Religious Services:   . Active Member of Clubs or Organizations:   . Attends Archivist Meetings:   Marland Kitchen Marital Status:   Intimate Partner Violence:   . Fear of Current or Ex-Partner:   . Emotionally Abused:   Marland Kitchen Physically Abused:   . Sexually Abused:      Allergies  Allergen Reactions  . Doxycycline Hives and Rash  .  Naproxen Rash and Hives     Outpatient Medications Prior to Visit  Medication Sig Dispense Refill  . albuterol (VENTOLIN HFA) 108 (90 Base) MCG/ACT inhaler Inhale 2 puffs into the lungs every 4 (four) hours as needed for wheezing or shortness of breath. 18 g 5  . aspirin EC 81 MG EC tablet Take 1 tablet (81 mg total) by mouth daily.    Marland Kitchen ezetimibe (ZETIA) 10 MG tablet Take 10 mg by mouth at bedtime.   11  . milk thistle 175 MG tablet Take 175 mg by mouth daily.    Marland Kitchen OVER THE COUNTER MEDICATION Take 1 packet by mouth 2 (two) times daily. Bariatric Multivitamin    . pyridOXINE (B-6) 50 MG tablet Take by mouth.    . cyanocobalamin 100 MCG tablet Take by mouth.     No facility-administered medications prior to visit.        Objective:   Physical Exam Vitals:   08/12/19 1206  BP: 140/82  Pulse: 92  Temp: (!) 97.3 F (36.3 C)  TempSrc: Temporal  SpO2: 95%  Weight: 224 lb 3.2 oz (101.7 kg)  Height: 5' 4"  (1.626 m)   Gen: Pleasant, overwt woman, in no distress,  normal affect  ENT: No lesions,  mouth clear,  oropharynx clear, no postnasal drip  Neck: No JVD, no stridor  Lungs: No use of accessory muscles, clear, better BS on the L than on the R  Cardiovascular: RRR, systolic M with intact S1 and S2  Musculoskeletal: No deformities, no cyanosis or clubbing  Neuro: alert, awake, non focal  Skin: Warm, no lesions or rash      Assessment & Plan:  Dyspnea Complex case.  She certainly has evidence for restrictive lung disease and I believe that her dyspnea is largely driven by pulmonary causes.  That being said she does have evidence for mild PAH on prior echo and several potential contributors including her valvular disease/surgery, paroxysmal atrial fibrillation, hypertension with diastolic dysfunction, obstructive sleep apnea, and now portal hypertension.  She does not desaturate on exertion.  Cardiopulmonary exercise test would be helpful in differentiating respiratory  contribution versus cardiac contribution, but she is concerned that she cannot do the test.  She may be a candidate for this and also for right heart catheterization  going forward depending on the work-up.  For now I believe the biggest contributions I can make will be to better evaluate for residual obstructive sleep apnea, treat if this is present.  Also work on her cardiopulmonary conditioning, weight loss.  I will refer her back for cardiopulmonary rehab as she believes she was benefiting from this and it was truncated by COVID-19 isolation.  Baltazar Apo, MD, PhD 08/12/2019, 1:21 PM [facility] [phone] or if no answer [phone]

## 2019-08-12 NOTE — Assessment & Plan Note (Signed)
Complex case.  She certainly has evidence for restrictive lung disease and I believe that her dyspnea is largely driven by pulmonary causes.  That being said she does have evidence for mild PAH on prior echo and several potential contributors including her valvular disease/surgery, paroxysmal atrial fibrillation, hypertension with diastolic dysfunction, obstructive sleep apnea, and now portal hypertension.  She does not desaturate on exertion.  Cardiopulmonary exercise test would be helpful in differentiating respiratory contribution versus cardiac contribution, but she is concerned that she cannot do the test.  She may be a candidate for this and also for right heart catheterization going forward depending on the work-up.  For now I believe the biggest contributions I can make will be to better evaluate for residual obstructive sleep apnea, treat if this is present.  Also work on her cardiopulmonary conditioning, weight loss.  I will refer her back for cardiopulmonary rehab as she believes she was benefiting from this and it was truncated by COVID-19 isolation.

## 2019-08-13 ENCOUNTER — Encounter (HOSPITAL_COMMUNITY): Payer: Self-pay | Admitting: *Deleted

## 2019-08-13 NOTE — Progress Notes (Signed)
Received referral from Dr. Lamonte Sakai for this pt to participate in pulmonary rehab with the the diagnosis of Restrictive Lung disease. Clinical review of pt follow up appt on 4/7Pulmonary office note. Pt is known to both the cardiac and pulmonary rehab staff.  Pt recently participated in pulmonary rehab in 2020.  This was cut short due to the departmental closure for efforts to support the surge of Covid 19 + patients.  Pt with Covid Risk Score - 3. Pt appropriate for scheduling for Pulmonary rehab.  Will forward to support staff for verification of insurance eligibility/benefitsand pulmonary rehab staff for scheduling with pt consent. Cherre Huger, BSN Cardiac and Training and development officer

## 2019-08-14 ENCOUNTER — Telehealth (HOSPITAL_COMMUNITY): Payer: Self-pay

## 2019-08-14 NOTE — Telephone Encounter (Signed)
Pt insurance is active and benefits verified through [place] $20, DED 0/0 met, out of pocket $4,000/$260 met, co-insurance 0%. no pre-authorization required. REF# [id number]

## 2019-08-25 ENCOUNTER — Telehealth (HOSPITAL_COMMUNITY): Payer: Self-pay

## 2019-08-29 ENCOUNTER — Other Ambulatory Visit (HOSPITAL_COMMUNITY)
Admission: RE | Admit: 2019-08-29 | Discharge: 2019-08-29 | Disposition: A | Discharge status: Home / Self Care | Payer: Medicare PPO | Source: Ambulatory Visit | Attending: Pulmonary Disease | Admitting: Pulmonary Disease

## 2019-08-29 DIAGNOSIS — Z01812 Encounter for preprocedural laboratory examination: Secondary | ICD-10-CM | POA: Diagnosis present

## 2019-08-29 DIAGNOSIS — Z20822 Contact with and (suspected) exposure to covid-19: Secondary | ICD-10-CM | POA: Diagnosis not present

## 2019-08-29 LAB — SARS CORONAVIRUS 2 (TAT 6-24 HRS): SARS Coronavirus 2: NEGATIVE

## 2019-09-01 ENCOUNTER — Other Ambulatory Visit: Payer: Self-pay

## 2019-09-01 ENCOUNTER — Ambulatory Visit (HOSPITAL_BASED_OUTPATIENT_CLINIC_OR_DEPARTMENT_OTHER): Payer: Medicare PPO | Attending: Emergency Medicine | Admitting: Pulmonary Disease

## 2019-09-01 DIAGNOSIS — G4733 Obstructive sleep apnea (adult) (pediatric): Secondary | ICD-10-CM

## 2019-09-03 ENCOUNTER — Telehealth: Payer: Self-pay | Admitting: Interventional Cardiology

## 2019-09-03 ENCOUNTER — Other Ambulatory Visit: Payer: Self-pay | Admitting: Interventional Cardiology

## 2019-09-03 MED ORDER — AMOXICILLIN 500 MG PO CAPS
ORAL_CAPSULE | ORAL | 3 refills | Status: DC
Start: 2019-09-03 — End: 2023-03-13

## 2019-09-03 NOTE — Telephone Encounter (Signed)
Called and left a detailed message on patient's VM letting her know that she does need to take amoxicillin prior to any dental procedures for SBE prophylaxis. Let her know that the Rx has been sent to [facility] on [facility]. Instructed her to call with any additional questions or concerns.

## 2019-09-03 NOTE — Telephone Encounter (Signed)
New Message    Pt c/o medication issue:  1. Name of Medication: Amoxacillin   2. How are you currently taking this medication (dosage and times per day)? Not taking   3. Are you having a reaction (difficulty breathing--STAT)? No   4. What is your medication issue? Pt has a dentist appt this morning and she is needing to take Amoxacillin before she goes for her cleaning    Please call Pt has her appt this morning

## 2019-09-06 IMAGING — RF DG SNIFF TEST
5 series · 20 of 20 positions shown · non-contrast
Comparison: 12/17/2018 chest radiograph.

CLINICAL DATA: Elevated right hemidiaphragm.

EXAM:
CHEST FLUOROSCOPY
TECHNIQUE: Real-time fluoroscopic evaluation of the chest was performed.
FLUOROSCOPY TIME:  Fluoroscopy Time:  0 minutes 48 seconds
Radiation Exposure Index (if provided by the fluoroscopic device):
6.9 mGy
Number of Acquired Spot Images: 0

[Series 1: cp_chest · 0.56mm/px · 4 of 30 frames shown (1 of 5)]
[frame 5/30]
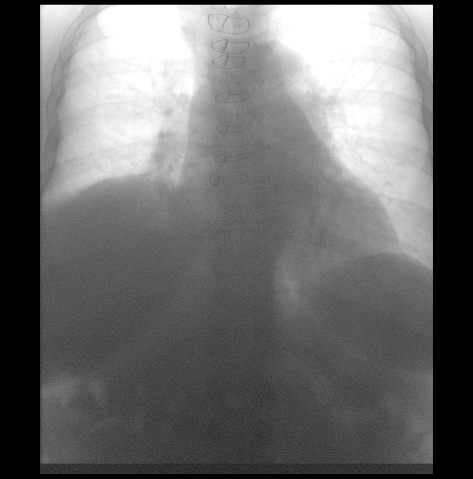
[frame 16/30]
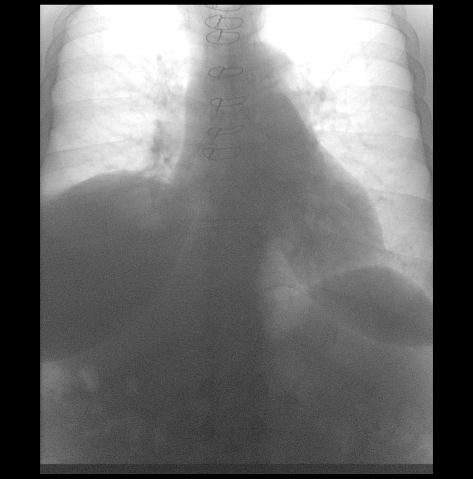
[frame 26/30]
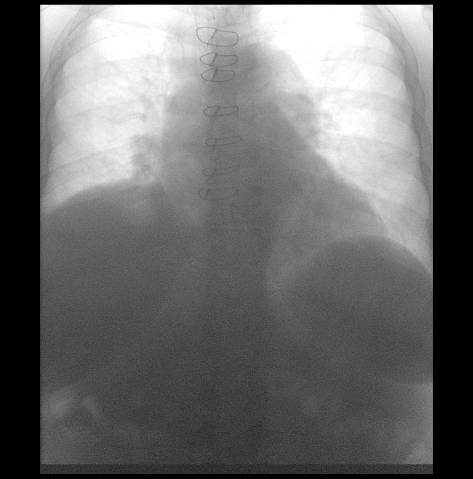
[frame 27/30]
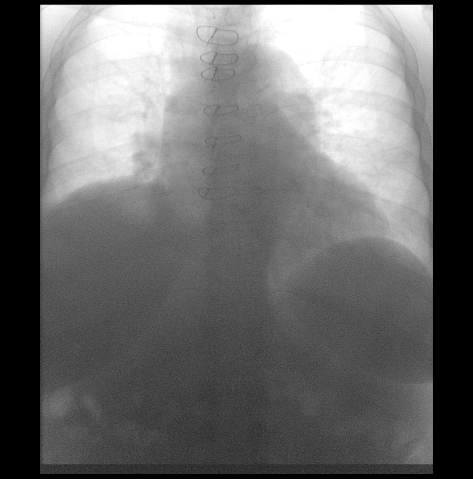

[Series 2: cp_chest · 0.55mm/px · 4 of 48 frames shown (2 of 5)]
[frame 8/48]
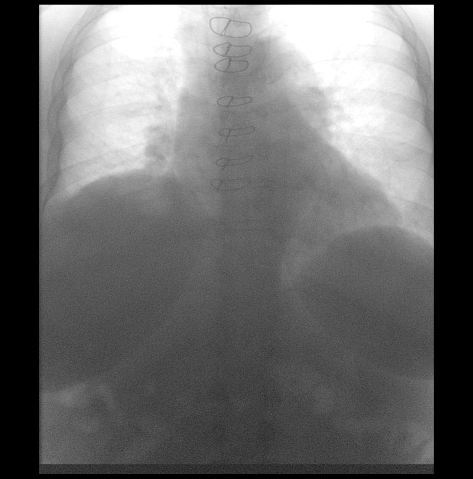
[frame 11/48]
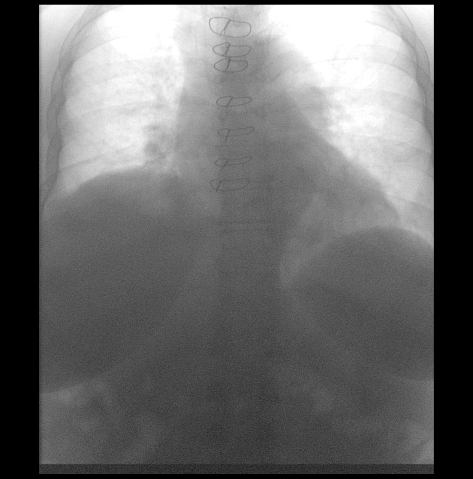
[frame 25/48]
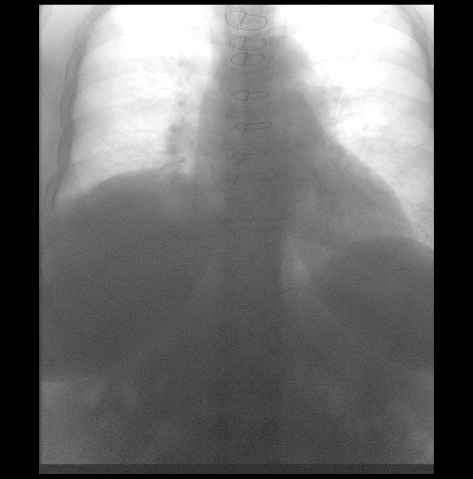
[frame 41/48]
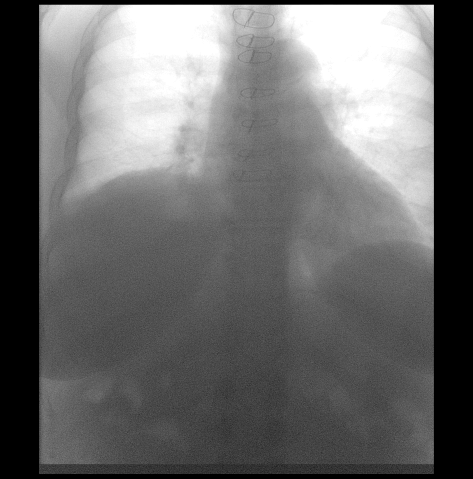

[Series 3: cp_chest · 0.54mm/px · 4 of 22 frames shown (3 of 5)]
[frame 2/22]
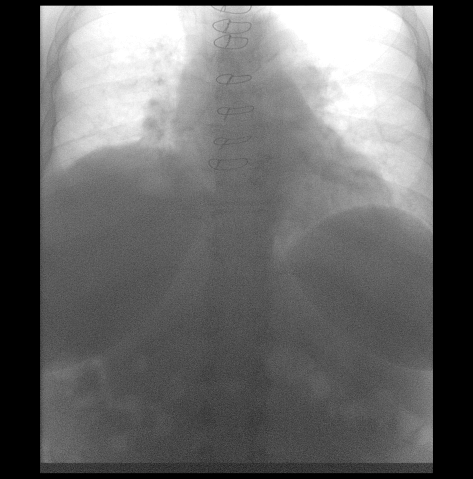
[frame 4/22]
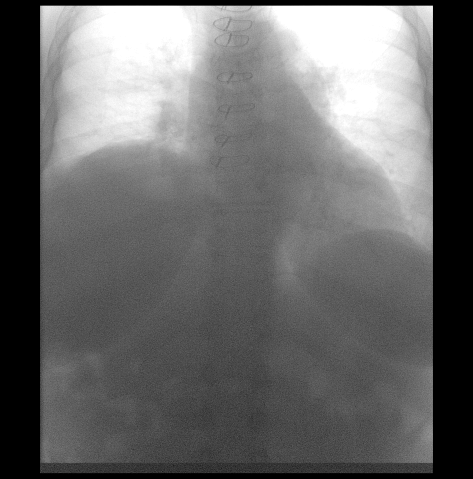
[frame 12/22]
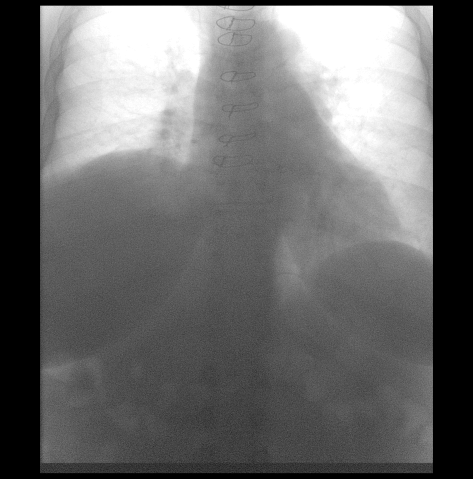
[frame 19/22]
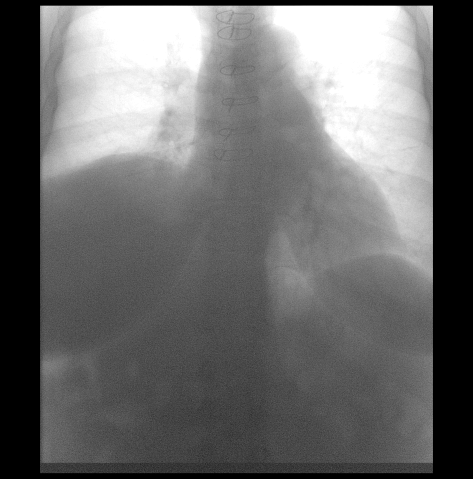

[Series 4: cp_chest · 0.54mm/px · 4 of 34 frames shown (4 of 5)]
[frame 6/34]
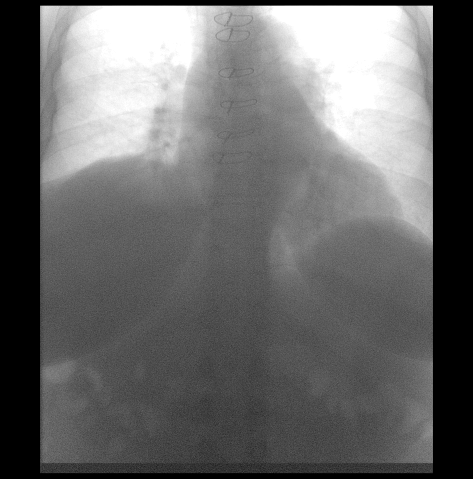
[frame 8/34]
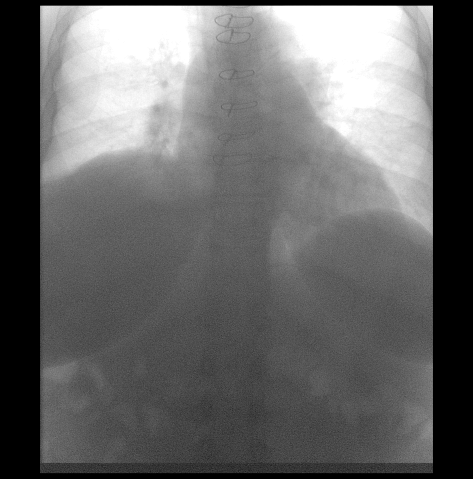
[frame 18/34]
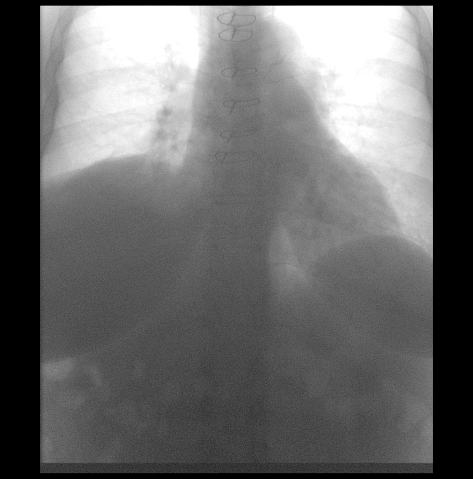
[frame 29/34]
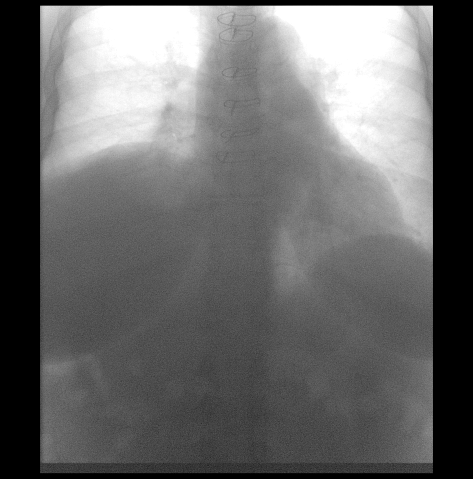

[Series 5: cp_chest · 0.54mm/px · 4 of 100 frames shown (5 of 5)]
[frame 16/100]
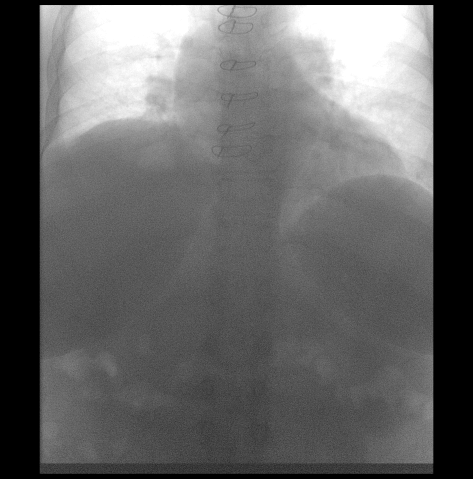
[frame 22/100]
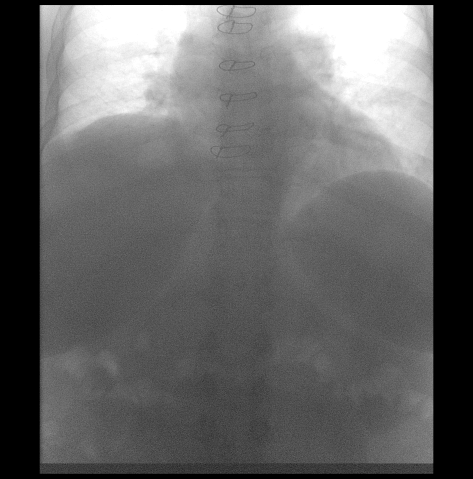
[frame 51/100]
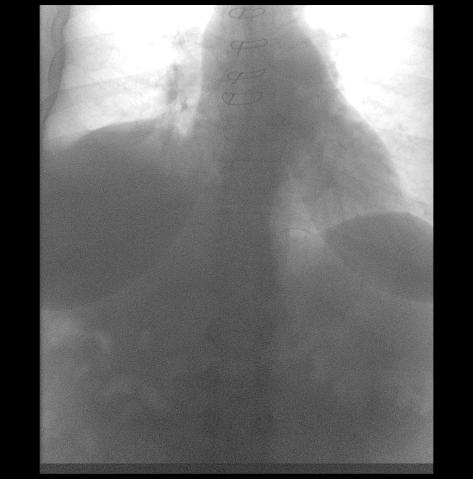
[frame 86/100]
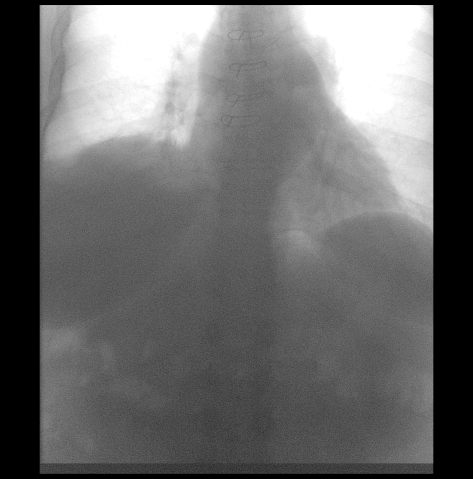

[20 of 20 positions shown; findings below may reference images not displayed]

FINDINGS: There is mild-to-moderate elevation of the right hemidiaphragm at
rest. There is normal muscular activity of the left hemidiaphragm.
There is asymmetric severe hemiparesis/near-hemiplegia of the right
hemidiaphragm during deep breathing, cough and sniff maneuvers, with
episodes of slight paradoxical elevation of right hemidiaphragm
during vigorous sniff maneuvers.
IMPRESSION: Asymmetric severe hemparesis/near-hemiplegia of the right
hemidiaphragm. Normal muscular function of the left hemidiaphragm.

## 2019-09-08 NOTE — Procedures (Signed)
    Patient Name: Whitney [name], Whitney [name] Date: 09/01/2019 Gender: Female D.O.B: 1956-01-04 Age (years): 64 Referring Provider: Baltazar Apo Height (inches): 26 Interpreting Physician: Chesley Mires MD, ABSM Weight (lbs): 220 RPSGT: Carolin Coy BMI: 8 MRN: [id number] Neck Size: 15.00  CLINICAL INFORMATION Sleep Study Type: NPSG  Indication for sleep study: Diabetes, Fatigue, Morning Headaches, Obesity, Snoring  Epworth Sleepiness Score: 12  Most recent polysomnogram dated 08/28/1997 revealed RDI of 27/h.  SLEEP STUDY TECHNIQUE As per the AASM Manual for the Scoring of Sleep and Associated Events v2.3 (April 2016) with a hypopnea requiring 4% desaturations.  The channels recorded and monitored were frontal, central and occipital EEG, electrooculogram (EOG), submentalis EMG (chin), nasal and oral airflow, thoracic and abdominal wall motion, anterior tibialis EMG, snore microphone, electrocardiogram, and pulse oximetry.  MEDICATIONS Medications self-administered by patient taken the night of the study : N/A  SLEEP ARCHITECTURE The study was initiated at 11:02:10 PM and ended at 5:02:59 AM.  Sleep onset time was 73.7 minutes and the sleep efficiency was 67.5%%. The total sleep time was 243.5 minutes.  Stage REM latency was 41.0 minutes.  The patient spent 12.7%% of the night in stage N1 sleep, 80.1%% in stage N2 sleep, 0.2%% in stage N3 and 7% in REM.  Alpha intrusion was absent.  Supine sleep was 29.16%.  RESPIRATORY PARAMETERS The overall apnea/hypopnea index (AHI) was 20.5 per hour. There were 7 total apneas, including 5 obstructive, 2 central and 0 mixed apneas. There were 76 hypopneas and 22 RERAs.  The AHI during Stage REM sleep was 74.1 per hour.  AHI while supine was 42.3 per hour.  The mean oxygen saturation was 87.5%. The minimum SpO2 during sleep was 56.0%.  She had 1 liter supplemental oxygen applied during the study due to the severity of the oxygen  desaturation.  Moderate snoring was noted during this study.  CARDIAC DATA The 2 lead EKG demonstrated sinus rhythm. The mean heart rate was 80.3 beats per minute. Other EKG findings include: PVCs.  LEG MOVEMENT DATA The total PLMS were 0 with a resulting PLMS index of 0.0. Associated arousal with leg movement index was 1.5 .  IMPRESSIONS - Moderate obstructive sleep apnea occurred during this study (AHI = 20.5/h).  The majority of events were observed in REM sleep with an REM AHI of 74.1. - No significant central sleep apnea occurred during this study (CAI = 0.5/h). - Severe oxygen desaturation was noted during this study (Min O2 = 56.0%).  She had 1 liter supplemental oxygen applied during the study due to the severity of the oxygen desaturation. - The patient snored with moderate snoring volume. - EKG findings include PVCs. - Clinically significant periodic limb movements did not occur during sleep. No significant associated arousals.  DIAGNOSIS - Obstructive Sleep Apnea (327.23 [G47.33 ICD-10]) - Nocturnal Hypoxemia (327.26 [G47.36 ICD-10])  RECOMMENDATIONS - Therapeutic CPAP titration in the sleep lab to determine optimal pressure required to alleviate sleep disordered breathing and determine whether she would require supplemental oxygen at night in addition to CPAP therapy.  [Electronically signed] 09/08/2019 03:36 PM  Chesley Mires MD, [place], American Board of Sleep Medicine   NPI: [id number]

## 2019-09-15 DIAGNOSIS — G4733 Obstructive sleep apnea (adult) (pediatric): Secondary | ICD-10-CM

## 2019-09-15 NOTE — Telephone Encounter (Signed)
Dr. Lamonte Sakai please advise if you have sleep study results.

## 2019-09-16 NOTE — Telephone Encounter (Signed)
I reviewed her Home PSG >> it does show evidence for OSA. Based on that, I would recommend a trial of CPAP if she agrees. Best next step would be a CPAP titration study in the sleep lab. Alternatively we could order auto-titration CPAP 5-20 cm H2O if she does not want to go to the sleep lab.   I would hold off on arranging the R heart cath until after she has several months of CPAP therapy - the data would be more useful at that time. We can decide whether to get the R heart cath depending on how she is doing at that time.

## 2019-09-29 ENCOUNTER — Ambulatory Visit (INDEPENDENT_AMBULATORY_CARE_PROVIDER_SITE_OTHER): Payer: Medicare PPO | Admitting: Pulmonary Disease

## 2019-09-29 ENCOUNTER — Other Ambulatory Visit: Payer: Self-pay

## 2019-09-29 ENCOUNTER — Encounter: Payer: Self-pay | Admitting: Pulmonary Disease

## 2019-09-29 DIAGNOSIS — R0602 Shortness of breath: Secondary | ICD-10-CM

## 2019-09-29 DIAGNOSIS — K7469 Other cirrhosis of liver: Secondary | ICD-10-CM | POA: Diagnosis not present

## 2019-09-29 DIAGNOSIS — G4733 Obstructive sleep apnea (adult) (pediatric): Secondary | ICD-10-CM

## 2019-09-29 NOTE — Assessment & Plan Note (Signed)
Per patient currently being worked up for [employer] liver cirrhosis Continue follow-up with gastroenterology

## 2019-09-29 NOTE — Patient Instructions (Addendum)
You were seen today by Lauraine Rinne, NP  for:   1. OSA (obstructive sleep apnea)  New CPAP start DME: [employer] [facility]  We recommend that you start using your CPAP daily >>>Keep up the hard work using your device >>> Goal should be wearing this for the entire night that you are sleeping, at least 4 to 6 hours  Remember:  . Do not drive or operate heavy machinery if tired or drowsy.  . Please notify the supply company and office if you are unable to use your device regularly due to missing supplies or machine being broken.  . Work on maintaining a healthy weight and following your recommended nutrition plan  . Maintain proper daily exercise and movement  . Maintaining proper use of your device can also help improve management of other chronic illnesses such as: Blood pressure, blood sugars, and weight management.   BiPAP/ CPAP Cleaning:  >>>Clean weekly, with Dawn soap, and bottle brush.  Set up to air dry. >>> Wipe mask out daily with wet wipe or towelette   2. Other cirrhosis of liver ([place]) 3. Shortness of breath  Follow Up:    Return in about 2 months (around 11/29/2019), or if symptoms worsen or fail to improve, for Follow up with Dr. Lamonte Sakai.   Please do your part to reduce the spread of COVID-19:      Reduce your risk of any infection  and COVID19 by using the similar precautions used for avoiding the common cold or flu:  Marland Kitchen Wash your hands often with soap and warm water for at least 20 seconds.  If soap and water are not readily available, use an alcohol-based hand sanitizer with at least 60% alcohol.  . If coughing or sneezing, cover your mouth and nose by coughing or sneezing into the elbow areas of your shirt or coat, into a tissue or into your sleeve (not your hands). Langley Gauss A MASK when in public  . Avoid shaking hands with others and consider head nods or verbal greetings only. . Avoid touching your eyes, nose, or mouth with unwashed hands.  . Avoid  close contact with people who are sick. . Avoid places or events with large numbers of people in one location, like concerts or sporting events. . If you have some symptoms but not all symptoms, continue to monitor at home and seek medical attention if your symptoms worsen. . If you are having a medical emergency, call 911.   [place] / e-Visit: [email or web address]         [facility]: [facility]: [phone]                   [facility]: [phone]     It is flu season:   >>> Best ways to protect herself from the flu: Receive the yearly flu vaccine, practice good hand hygiene washing with soap and also using hand sanitizer when available, eat a nutritious meals, get adequate rest, hydrate appropriately   Please contact the office if your symptoms worsen or you have concerns that you are not improving.   Thank you for choosing [facility] for your healthcare, and for allowing [place] to partner with you on your healthcare journey. I am thankful to be able to provide care to you today.   Wyn Quaker FNP-C

## 2019-09-29 NOTE — Progress Notes (Addendum)
Virtual Visit via Telephone Note  I connected with Whitney [name] on 09/29/19 at 11:00 AM EDT by telephone and verified that I am speaking with the correct person using two identifiers.  Location: Patient: Home Provider: [facility] - [address], [place], [place], [place] [address]   I discussed the limitations, risks, security and privacy concerns of performing an evaluation and management service by telephone and the availability of in person appointments. I also discussed with the patient that there may be a patient responsible charge related to this service. The patient expressed understanding and agreed to proceed.  Patient consented to consult via telephone: Yes People present and their role in pt care: Pt     History of Present Illness:  64 year old never smoker followed in our office for dyspnea   past medical history: Karlene Lineman: Sjogren syndrome, diastolic heart failure, status post bariatric surgery, fibromyalgia Smoking history: Never smoker Maintenance: None Patient of Dr. Lamonte Sakai   Chief complaint: Discuss sleep study results   64 year old female never smoker followed in our office for chronic cough and moderate obstructive sleep apnea.  Patient was last seen in our office in April/2021 by Dr. Lamonte Sakai.  At that time a split-night sleep study was ordered which revealed moderate obstructive sleep apnea.  A walk in office did not reveal any physical desaturations with exertion.  She was referred back to cardiopulmonary rehab  Patient contacted our office on 09/15/2019 stating she was followed up with the company about starting CPAP.  She was confused regarding the follow-up. She was scheduled for a virtual visit today to further review.   Patient has previous diagnosis of moderate obstructive sleep apnea based off the 1999 sleep study.  Patient also reports a home sleep study performed with Dr. Maxwell Caul at [place].  She is unsure about those results.  I  do not have those records to review.  Patient also being worked up for [employer] liver cirrhosis.  She is agreeable to restarting CPAP therapy.  Her CPAP was stopped previously due to bariatric surgery with weight loss.  Patient has recently gained around 30 pounds.  She feels that this may be why her obstructive sleep apnea has worsened.  She would prefer to use nasal pillows  instead of a full facemask.  We will discuss this today.  Observations/Objective:  1999 - sleep study - AHI 29  09/01/2019-split-night sleep study-AHI 20.5  01/09/2019-sniff test-asymmetrical severe hemiparesis near hemiaplasia of the right hemidiaphragm  12/26/2018-echocardiogram-LV ejection fraction 55 to 60%, moderately increased left ventricular wall thickness, right ventricle is normal systolic function  12/29/537-JQBHALPFX function test-FVC 1.35 (42% predicted), postbronchodilator ratio 87, postbronchodilator FEV1 1.28 (52% predicted), positive bronchodilator response in FEV1, mid flow reversibility, DLCO 14.76 (74% predicted)  Social History   Tobacco Use  Smoking Status Never Smoker  Smokeless Tobacco Never Used   Immunization History  Administered Date(s) Administered  . Fluad Quad(high Dose 65+) 01/06/2019  . Influenza Split 02/25/2011  . PFIZER SARS-COV-2 Vaccination 05/28/2019, 06/17/2019      Assessment and Plan:  OSA (obstructive sleep apnea) Recent split-night sleep study shows moderate obstructive sleep apnea  Plan: Start CPAP therapy APAP setting 5-15 Mask of choice DME: [facility] 77-monthfollow-up in our office  Cirrhosis Per patient currently being worked up for [employer] cirrhosis Continue follow-up with gastroenterology  Dyspnea Plan: Continue follow-up with pulmonary rehab Start CPAP therapy 234-monthollow-up with our office   Follow Up Instructions:  Return in about  2 months (around 11/29/2019), or if symptoms worsen or fail to improve, for Follow up with Dr. Lamonte Sakai.   I  discussed the assessment and treatment plan with the patient. The patient was provided an opportunity to ask questions and all were answered. The patient agreed with the plan and demonstrated an understanding of the instructions.   The patient was advised to call back or seek an in-person evaluation if the symptoms worsen or if the condition fails to improve as anticipated.  I provided 23 minutes of non-face-to-face time during this encounter.   Lauraine Rinne, NP

## 2019-09-29 NOTE — Assessment & Plan Note (Signed)
Plan: Continue follow-up with pulmonary rehab Start CPAP therapy 71-monthfollow-up with our office

## 2019-09-29 NOTE — Assessment & Plan Note (Signed)
Recent split-night sleep study shows moderate obstructive sleep apnea  Plan: Start CPAP therapy APAP setting 5-15 Mask of choice DME: [facility] 46-monthfollow-up in our office

## 2019-10-07 NOTE — Telephone Encounter (Signed)
I have sent this order to [employer] since pt does not want [facility] and I have cancelled the order that was sent to [facility]

## 2019-10-07 NOTE — Telephone Encounter (Signed)
Received the following message from patient:   "Hi,  This is Patent attorney. [employer] are called again. I did not answer due to the fact that you mentioned we would use another provider. The message they left said you guys sent orders??? They also wanted to discuss my "financials". Please advise. Thanks  Whitney [name]"  I reviewed her chart, it looks like the order was placed on May 18th but didn't mention anything about not sending the order to [facility]. PCCs, can we please send her cpap order to another DME? Thanks!

## 2020-01-04 ENCOUNTER — Other Ambulatory Visit: Payer: Self-pay

## 2020-01-04 ENCOUNTER — Ambulatory Visit: Payer: Medicare PPO | Admitting: Emergency Medicine

## 2020-01-04 ENCOUNTER — Encounter: Payer: Self-pay | Admitting: Emergency Medicine

## 2020-01-04 DIAGNOSIS — G4733 Obstructive sleep apnea (adult) (pediatric): Secondary | ICD-10-CM

## 2020-01-04 DIAGNOSIS — R0602 Shortness of breath: Secondary | ICD-10-CM | POA: Diagnosis not present

## 2020-01-04 NOTE — Assessment & Plan Note (Signed)
Multifactorial, likely at least in part related to volume status and portal pressures from underlying liver disease but also I think largely due to deconditioning.  She may have mild obstruction based on pulmonary function testing and get some marginal improvement with albuterol.  I think it is okay for her to continue this but there is no real indication for long-acting bronchodilator therapy.  Hopefully treating her obstructive sleep apnea will help with presumed underlying secondary pulmonary hypertension.  Further work-up for the [facility] has been deferred thus far, i.e. no right heart catheterization etc.

## 2020-01-04 NOTE — Progress Notes (Signed)
Subjective:    Patient ID: Whitney [name], female    DOB: [birth year], 64 y.o.   MRN: [id number]  HPI  ROVB 08/12/19 --this is a follow-up visit for 64 year old woman with history of obesity, OSA (not on CPAP), NASH cirrhosis, aortic stenosis with an AVR, Sjogren's syndrome.  Pulmonary function testing from 12/2018 showed restrictive lung disease.  She underwent a sniff test on 01/09/2019 that showed mild to moderate elevation of right hemidiaphragm and evidence for hemiparesis.  She participated in cardiopulmonary rehab at the end of 2020, was truncated due to [place], may have helped her.   She was seen by the [facility] at [facility] 06/16/2019 to further evaluate her dyspnea and and try to discern whether pulmonary hypertension may be driving progressive symptoms versus her known restrictive lung disease, deconditioning. Potential contributors to her [place] > her valvular disease, portal pressures from her underlying liver disease.  Wt stable since our last visit.   They are considering a right heart catheterization and possibly a cardiopulmonary exercise test to differentiate between cardiac and pulmonary contributions to her dyspnea. She is concerned that she cannot walk very long - she may be able top 10 minutes at her pace.   TTE from 12/26/18 reviewed > shows normal RV size and fxn, slightly dilated RA; her estimated PASP in 05/2018 was 31 mmHg.   ROV 01/04/20 --64 year old woman with a history of OSA, NASH cirrhosis, aortic stenosis/AVR, Sjogren's.  Also with mild to moderate right hemidiaphragmatic elevation/paresis.  She has been evaluated for PAH.  She had untreated obstructive sleep apnea and was seen in May, CPAP was initiated beginning mid July. She has been wearing reliably. Believes that her cough has increased since starting it. She has been having increase PND and allergy sx. Very good compliance, 100% of the night. She does feel some clinical benefit, longer sleep, easier to get to sleep, but she still  is exhausted during the day. She does not nap.  She still has exertional SOB, especially in the heat. She uses albuterol with some brief relief.  She is interested in possibly going back to Pulm rehab     Review of Systems  Constitutional: Negative for fever and unexpected weight change.  HENT: Negative for congestion, dental problem, ear pain, nosebleeds, postnasal drip, rhinorrhea, sinus pressure, sneezing, sore throat and trouble swallowing.   Eyes: Negative for redness and itching.  Respiratory: Positive for shortness of breath. Negative for cough, chest tightness and wheezing.   Cardiovascular: Negative for palpitations and leg swelling.  Gastrointestinal: Negative for nausea and vomiting.  Genitourinary: Negative for dysuria.  Musculoskeletal: Negative for joint swelling.  Skin: Negative for rash.  Neurological: Negative for headaches.  Hematological: Does not bruise/bleed easily.  Psychiatric/Behavioral: Negative for dysphoric mood. The patient is not nervous/anxious.      Past Medical History:  Diagnosis Date  . Allergic rhinitis   . Anemia    hx  . Arthritis   . Asthma    hx yrs ago  . Cirrhosis ([place]) last albumin 3.3 done at [facility] 06-16-2014 (under care everywhere tab in epic)   Secondary to Fatty liver --  followed by hepatology at [facility] (dr Gerald Dexter)   . Depression   . Diabetes mellitus type II    type 2 diet conrolled  . Dyspnea   . Fibromyalgia   . GERD (gastroesophageal reflux disease)   . Heart murmur    asymptomatic ---  1989 from rhuematic fever  . History of exercise stress  test    05-05-2013---  negative bruce ETT given exercise workload,  no ischemia  . History of hiatal hernia   . History of kidney stones   . History of rheumatic fever    1989  . Hyperlipidemia   . Leukocytopenia   . Moderate aortic stenosis    AVA area 1.1cm2---  cardiologist --  dr Concepcion Living, 2014 in epic  . NASH (nonalcoholic steatohepatitis)   . OSA (obstructive sleep apnea)     was using CPAP before gastric sleeve 2015--  no uses after wt loss  . Pneumonia    hx  . Sjogren's syndrome ([place])   . Thrombocytopenia ([place])      Family History  Problem Relation Age of Onset  . Alzheimer's disease Father   . Hip fracture Father   . Asthma Father   . Heart disease Father   . Heart attack Father   . Hypertension Father   . Rheum arthritis Mother   . Heart disease Mother   . Allergies Daughter   . Asthma Paternal Grandmother   . Asthma Daughter   . Stroke Neg Hx      Social History   Socioeconomic History  . Marital status: Widowed    Spouse name: Married to [employer]  . Number of children: Not on file  . Years of education: Not on file  . Highest education level: Not on file  Occupational History  . Occupation: principal of [employer]  Tobacco Use  . Smoking status: Never Smoker  . Smokeless tobacco: Never Used  Vaping Use  . Vaping Use: Never used  Substance and Sexual Activity  . Alcohol use: Yes    Comment: rarely  . Drug use: No  . Sexual activity: Not on file  Other Topics Concern  . Not on file  Social History Narrative  . Not on file   Social Determinants of Health   Financial Resource Strain:   . Difficulty of Paying Living Expenses: Not on file  Food Insecurity:   . Worried About Charity fundraiser in the Last Year: Not on file  . Ran Out of Food in the Last Year: Not on file  Transportation Needs:   . Lack of Transportation (Medical): Not on file  . Lack of Transportation (Non-Medical): Not on file  Physical Activity:   . Days of Exercise per Week: Not on file  . Minutes of Exercise per Session: Not on file  Stress:   . Feeling of Stress : Not on file  Social Connections:   . Frequency of Communication with Friends and Family: Not on file  . Frequency of Social Gatherings with Friends and Family: Not on file  . Attends Religious Services: Not on file  . Active Member of Clubs or Organizations: Not on file  . Attends  Archivist Meetings: Not on file  . Marital Status: Not on file  Intimate Partner Violence:   . Fear of Current or Ex-Partner: Not on file  . Emotionally Abused: Not on file  . Physically Abused: Not on file  . Sexually Abused: Not on file     Allergies  Allergen Reactions  . Doxycycline Hives and Rash  . Naproxen Rash and Hives     Outpatient Medications Prior to Visit  Medication Sig Dispense Refill  . albuterol (VENTOLIN HFA) 108 (90 Base) MCG/ACT inhaler Inhale 2 puffs into the lungs every 4 (four) hours as needed for wheezing or shortness of breath. [place]  g 5  . amoxicillin (AMOXIL) 500 MG capsule Take 4 capsules 1 hour prior to any dental procedures 4 capsule 3  . aspirin EC 81 MG EC tablet Take 1 tablet (81 mg total) by mouth daily.    Marland Kitchen ezetimibe (ZETIA) 10 MG tablet Take 10 mg by mouth at bedtime.   11  . milk thistle 175 MG tablet Take 175 mg by mouth daily.    Marland Kitchen OVER THE COUNTER MEDICATION Take 1 packet by mouth 2 (two) times daily. Bariatric Multivitamin    . pyridOXINE (B-6) 50 MG tablet Take by mouth.     No facility-administered medications prior to visit.        Objective:   Physical Exam Vitals:   01/04/20 1223  BP: 122/70  Pulse: 61  Temp: 98.9 F (37.2 C)  TempSrc: Oral  SpO2: 94%  Weight: 220 lb 3.2 oz (99.9 kg)  Height: 5' 2"  (1.575 m)   Gen: Pleasant, overwt woman, in no distress,  normal affect  ENT: No lesions,  mouth clear,  oropharynx clear, no postnasal drip  Neck: No JVD, no stridor  Lungs: No use of accessory muscles, clear, better BS on the L than on the R  Cardiovascular: RRR, systolic M with intact S1 and S2  Musculoskeletal: No deformities, no cyanosis or clubbing  Neuro: alert, awake, non focal  Skin: Warm, no lesions or rash      Assessment & Plan:  OSA (obstructive sleep apnea) Confirm good compliance on her new CPAP today.  She is unsure if she is getting much clinical benefit, is still very fatigued during the  day.  She does not take naps.  Her energy level may be a little bit better and her sleep duration and initiation have both improved.  She is going to stick with it, work on perfect compliance and we will follow-up going forward for clinical benefit.  Dyspnea Multifactorial, likely at least in part related to volume status and portal pressures from underlying liver disease but also I think largely due to deconditioning.  She may have mild obstruction based on pulmonary function testing and get some marginal improvement with albuterol.  I think it is okay for her to continue this but there is no real indication for long-acting bronchodilator therapy.  Hopefully treating her obstructive sleep apnea will help with presumed underlying secondary pulmonary hypertension.  Further work-up for the [facility] has been deferred thus far, i.e. no right heart catheterization etc.  Baltazar Apo, MD, PhD 01/04/2020, 1:42 PM [facility] [phone] or if no answer [phone]

## 2020-01-04 NOTE — Patient Instructions (Addendum)
Please continue CPAP every night as you have been using it. Keep albuterol available use 2 puffs if needed for shortness of breath We will work on getting you back to pulmonary rehab Follow with Dr Lamonte Sakai in 6 months or sooner if you have any problems

## 2020-01-04 NOTE — Assessment & Plan Note (Signed)
Confirm good compliance on her new CPAP today.  She is unsure if she is getting much clinical benefit, is still very fatigued during the day.  She does not take naps.  Her energy level may be a little bit better and her sleep duration and initiation have both improved.  She is going to stick with it, work on perfect compliance and we will follow-up going forward for clinical benefit.

## 2020-01-10 IMAGING — CT CT ABDOMEN WO/W CM
3 of 5 series · 12 of 32 positions shown, 17 images · IV contrast (APPLIED)
Comparison: CT the abdomen and pelvis 10/31/2012.

CLINICAL DATA: 63-year-old female with history of non alcoholic
steatohepatitis ([facility]) and cirrhosis. Elevated right hemidiaphragm.

EXAM:
CT ABDOMEN WITHOUT AND WITH CONTRAST
TECHNIQUE: Multidetector CT imaging of the abdomen was performed following the
standard protocol before and following the bolus administration of
intravenous contrast.
CONTRAST:  100mL ZBVS3L-PZZ IOPAMIDOL (ZBVS3L-PZZ) INJECTION 61%

[Series 2: liver w/(date) · axial · 0.86mm/px · z∈[-174,-74]mm · 2 of 62 slices shown]
[im 21/62  soft-tissue]
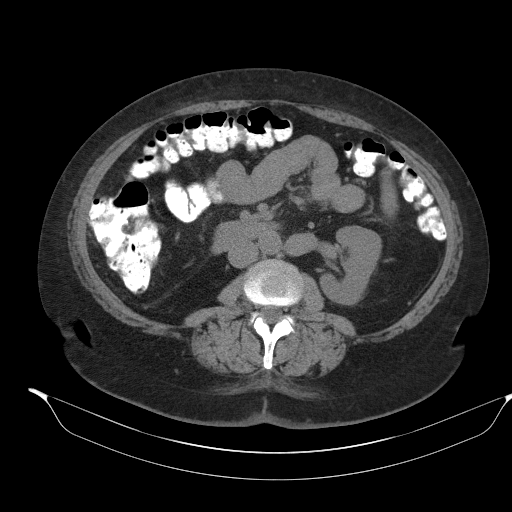
[im 41/62  soft-tissue]
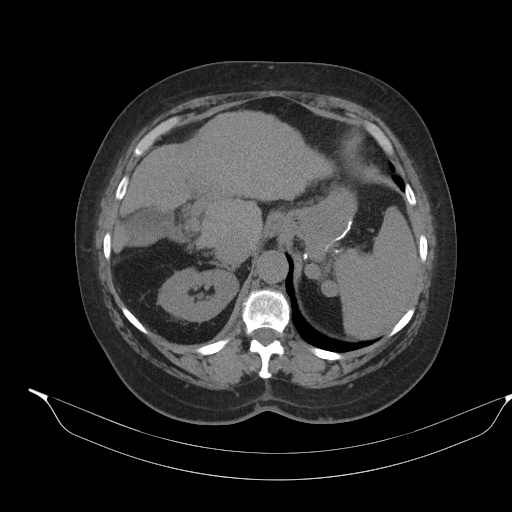

[Series 3: arterial · axial · arterial · 0.87mm/px · z∈[-224,-14]mm · 5 of 106 slices shown]
[im 18/106  soft-tissue]
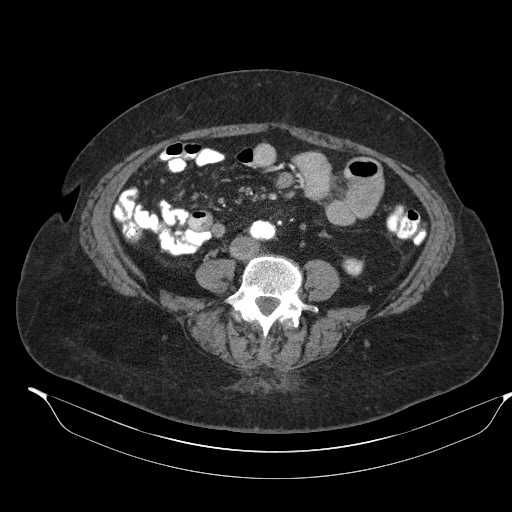
[im 36/106  soft-tissue]
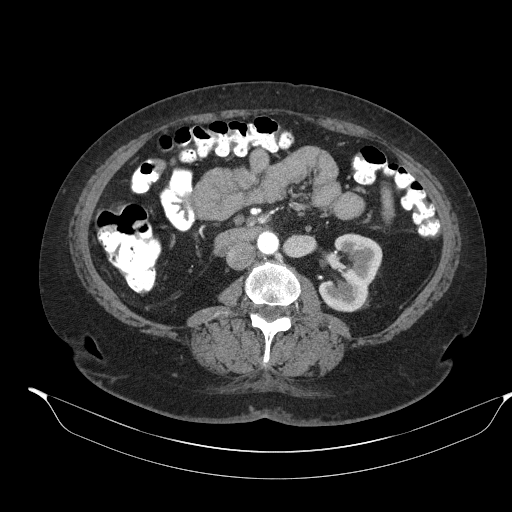
[im 53/106  soft-tissue]
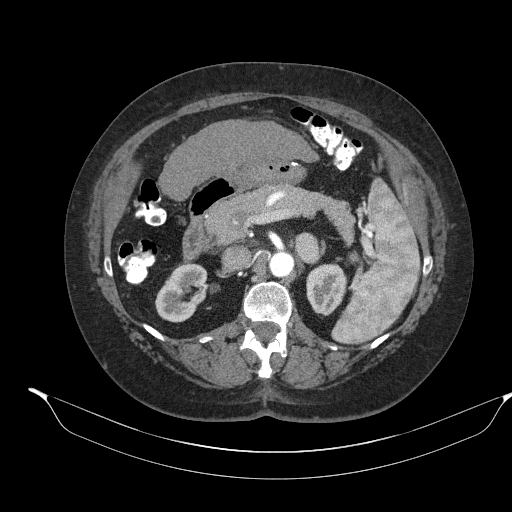
[im 71/106  soft-tissue]
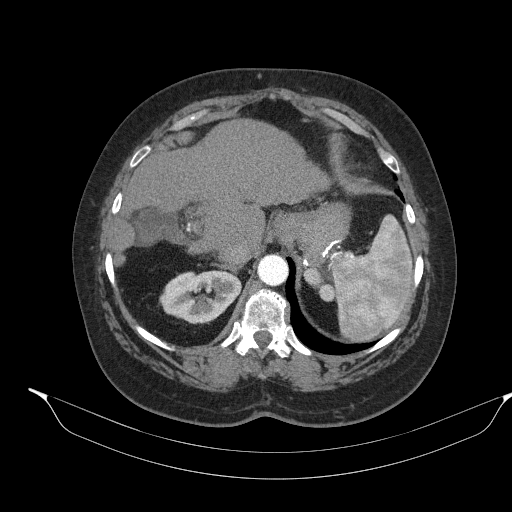
[im 88/106  soft-tissue]
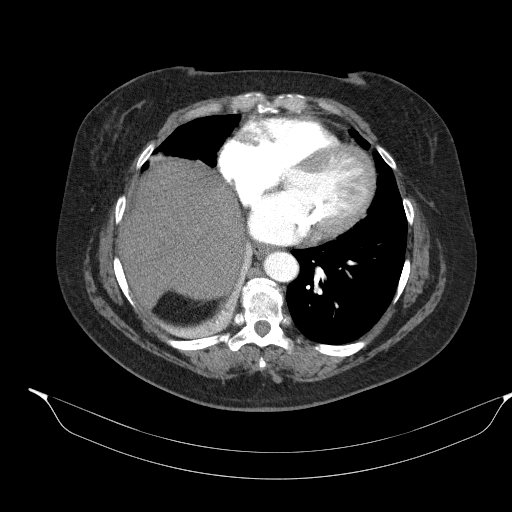

[Series 4: venous · axial · portal-venous · 0.86mm/px · z∈[-226,-13]mm · 5 of 107 slices shown, 10 images]
[im 18/107  soft-tissue]
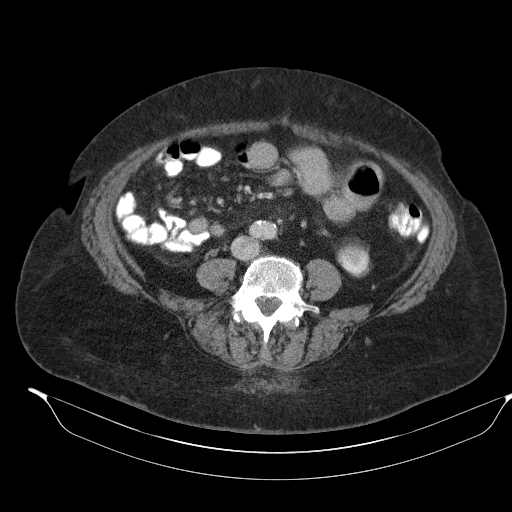
[im 18/107  bone]
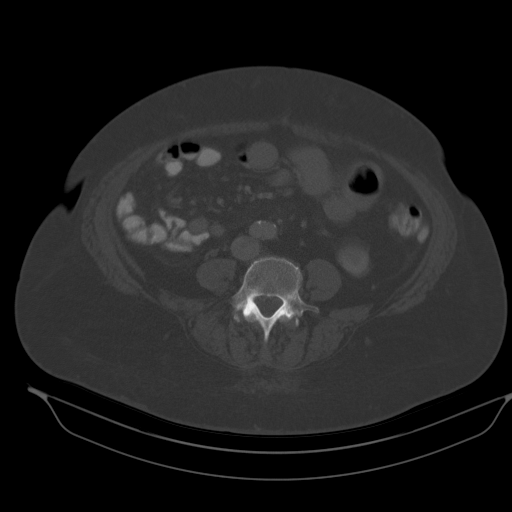
[im 36/107  soft-tissue]
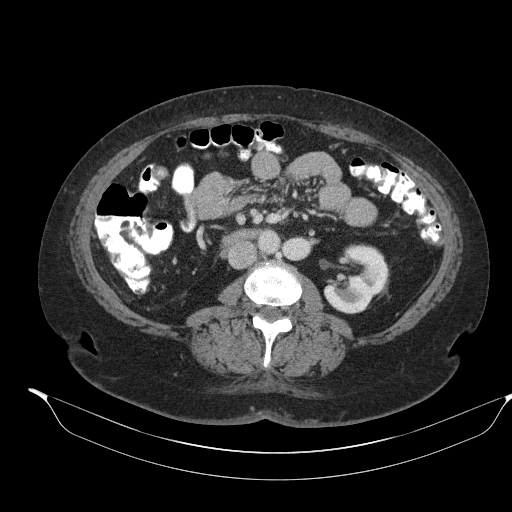
[im 36/107  lung]
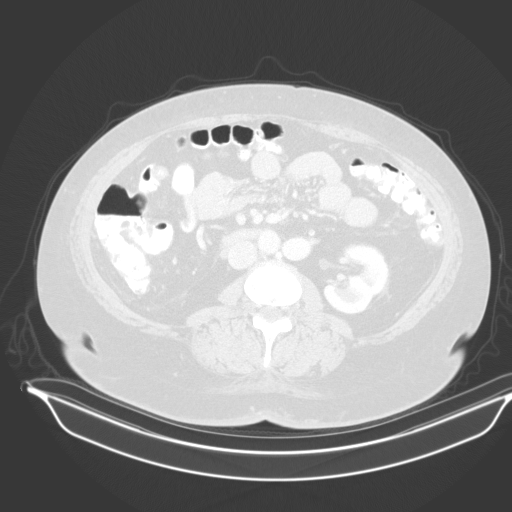
[im 54/107  soft-tissue]
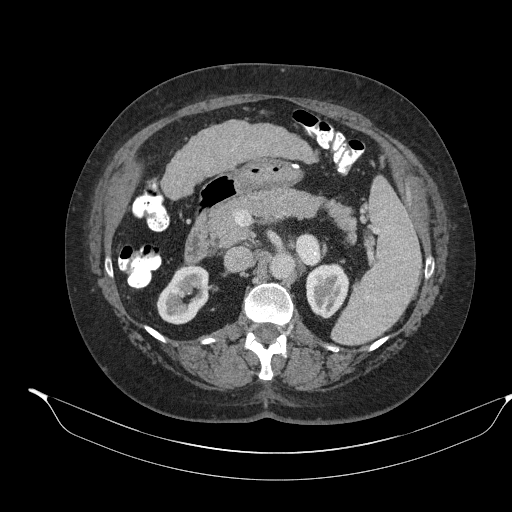
[im 54/107  lung]
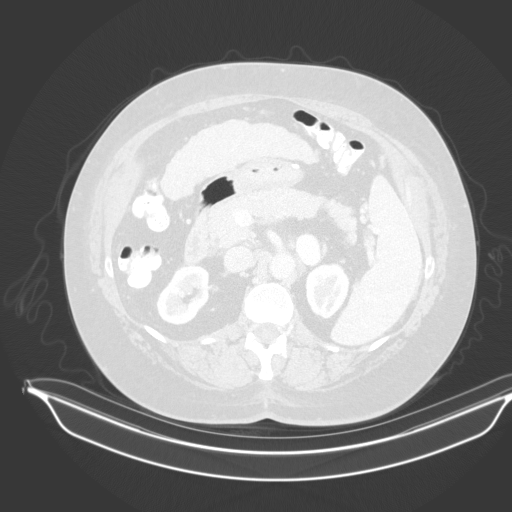
[im 71/107  soft-tissue]
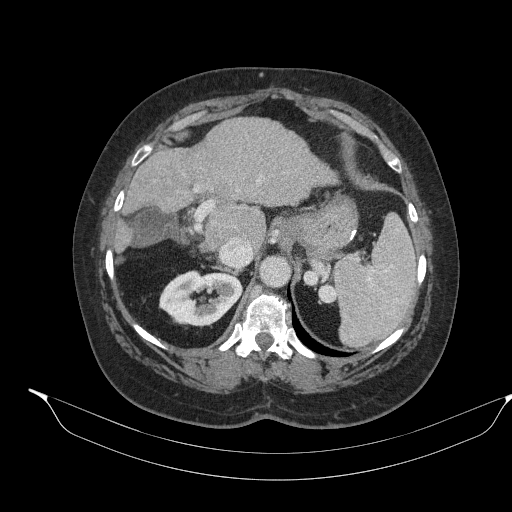
[im 71/107  lung]
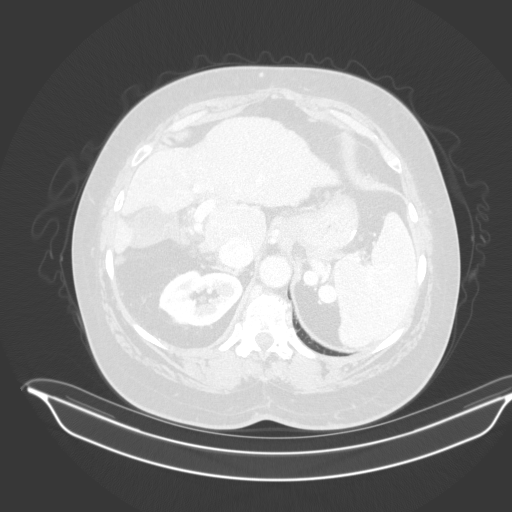
[im 89/107  soft-tissue]
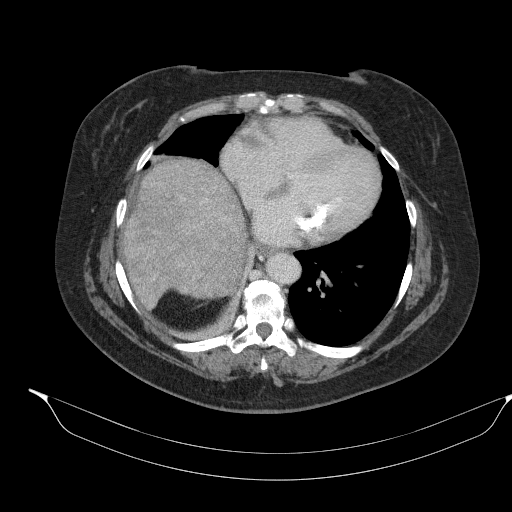
[im 89/107  lung]
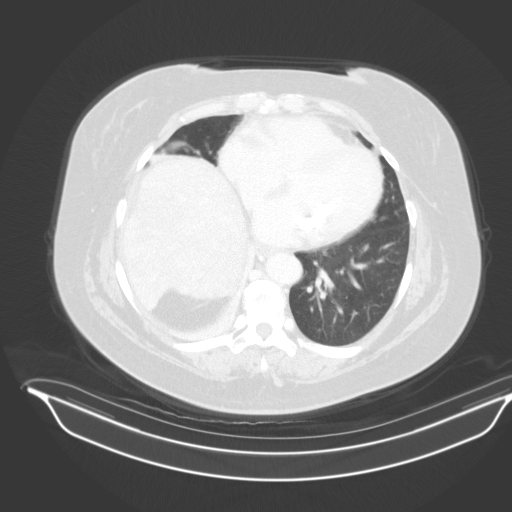

[12 of 32 positions shown; findings below may reference images not displayed]

FINDINGS: Lower chest: Mild cardiomegaly. Dilatation of the pulmonic trunk
(3.5 cm in diameter). There is aortic atherosclerosis, as well as
atherosclerosis of the great vessels of the mediastinum and the
coronary arteries, including calcified atherosclerotic plaque in the
left anterior descending coronary arteries. Severe calcifications of
the mitral valve and annulus, as well as the mitral subvalvular
apparatus. Status post median sternotomy for aortic valve
replacement with stented bioprosthesis.

Hepatobiliary: Liver has a severely shrunken appearance and nodular
contour, indicative of advanced cirrhosis. No hypervascular lesion
or other suspicious cystic or solid hepatic lesions are noted on
today's examination. No intra or extrahepatic biliary ductal
dilatation. Gallbladder is unremarkable in appearance.

Pancreas: No pancreatic mass. No pancreatic ductal dilatation. No
pancreatic or peripancreatic fluid collections or inflammatory
changes.

Spleen: Spleen is enlarged measuring 14.9 x 4.1 x 12.9 cm.
(estimated splenic volume of 394 mL).

Adrenals/Urinary Tract: Subcentimeter low-attenuation lesion in the
upper pole the right kidney, too small to characterize, but
statistically likely to represent a cyst. Left kidney and bilateral
adrenal glands are normal in appearance. No hydroureteronephrosis in
the visualized portions of the abdomen.

Stomach/Bowel: Postoperative changes of sleeve gastrectomy. No
pathologic dilatation of visualized portions of small bowel or
colon.

Vascular/Lymphatic: Aortic atherosclerosis, without evidence of
aneurysm or dissection in the abdominal vasculature. Portal vein is
patent and mildly dilated measuring 15 mm in diameter. Large
portosystemic collateral vessels are noted, most impressive of which
are massive splenorenal collaterals which have large varices up to
2.6 cm in diameter. Distal esophageal varices are also noted.
Multiple prominent borderline enlarged upper abdominal lymph nodes
measuring up to 9 mm in the gastrohepatic ligament.

Other: No significant volume of ascites or pneumoperitoneum noted in
the visualized portions of the peritoneal cavity.

Musculoskeletal: Median sternotomy wires. There are no aggressive
appearing lytic or blastic lesions noted in the visualized portions
of the skeleton.
IMPRESSION: 1. Severe cirrhosis with evidence of portal venous hypertension,
including dilated portal vein, splenomegaly and numerous
portosystemic collaterals, as detailed above.
2. No suspicious hepatic lesions are noted at this time.
3. Dilatation of the pulmonic trunk (3.5 cm in diameter), concerning
for pulmonary arterial hypertension.
4. Mild cardiomegaly.
5. Aortic atherosclerosis, in addition to left anterior descending
coronary artery disease. Please note that although the presence of
coronary artery calcium documents the presence of coronary artery
disease, the severity of this disease and any potential stenosis
cannot be assessed on this non-gated CT examination. Assessment for
potential risk factor modification, dietary therapy or pharmacologic
therapy may be warranted, if clinically indicated.
6. There are calcifications of the mitral valve, annulus and mitral
subvalvular apparatus. Echocardiographic correlation for evaluation
of potential valvular dysfunction may be warranted if clinically
indicated.
7. Additional incidental findings, as above.

## 2020-01-12 ENCOUNTER — Telehealth: Payer: Self-pay | Admitting: Interventional Cardiology

## 2020-01-12 DIAGNOSIS — R0789 Other chest pain: Secondary | ICD-10-CM

## 2020-01-12 DIAGNOSIS — R0602 Shortness of breath: Secondary | ICD-10-CM

## 2020-01-12 NOTE — Telephone Encounter (Signed)
New Message   Patient sent a mychart message with the follow complaints: "Lots of chest pain, fatigue, paralyzed diaphragm on right side, endo and liver echo done in August 2021. Beta blockers prescribed, leg cramps, blood work completed by Dr. Hulan Fess on 12/21/19. Something just doesn't feel right."   Please call patient to discuss.

## 2020-01-13 ENCOUNTER — Telehealth: Payer: Self-pay | Admitting: Interventional Cardiology

## 2020-01-13 NOTE — Telephone Encounter (Signed)
      I went in pt's chart to see if anybody responded to pt's message.

## 2020-01-13 NOTE — Telephone Encounter (Signed)
Called and spoke to patient. She states that she has been having a Dull aching pain in her left upper chest at rest. Does not have it on exertion. She states that it is near her left clavicle and goes up into her shoulder. She states that this is relieved with massaging the area. She states that it may be fibromyalgia. She states that she has been very fatigued lately. She states that her diaphragm is paralyzed on the right side and states that she becomes SOB when she bends over. She states that she had Labs with Dr. Rex Kras in August. She states that she had an upper endoscopy and liver ultrasound done at [facility] and that they started her on nadolol. Patient is asking if Dr. Irish Lack thinks that she should have the CPX done now. Will forward for review and recommendation.

## 2020-01-14 NOTE — Telephone Encounter (Signed)
OK to do CPX  JV

## 2020-01-15 NOTE — Telephone Encounter (Signed)
Called and made the patient aware that Dr. Irish Lack was okay with ordering CPX. Reviewed instructions. Covid test not needed per Landis Martins. Patient verbalized understanding and thanked me for the call.

## 2020-02-11 DIAGNOSIS — G4733 Obstructive sleep apnea (adult) (pediatric): Secondary | ICD-10-CM | POA: Diagnosis not present

## 2020-02-15 ENCOUNTER — Ambulatory Visit (HOSPITAL_COMMUNITY): Payer: Medicare PPO | Attending: Interventional Cardiology

## 2020-02-15 ENCOUNTER — Other Ambulatory Visit: Payer: Self-pay

## 2020-02-15 DIAGNOSIS — R0789 Other chest pain: Secondary | ICD-10-CM | POA: Diagnosis not present

## 2020-02-15 DIAGNOSIS — R0602 Shortness of breath: Secondary | ICD-10-CM | POA: Insufficient documentation

## 2020-02-22 NOTE — Telephone Encounter (Signed)
Dr. Byrum, please see pt's mychart message and advise. 

## 2020-02-23 NOTE — Telephone Encounter (Signed)
Called and spoke to patient.

## 2020-03-28 DIAGNOSIS — G4733 Obstructive sleep apnea (adult) (pediatric): Secondary | ICD-10-CM | POA: Diagnosis not present

## 2020-04-18 ENCOUNTER — Encounter: Payer: Self-pay | Admitting: Emergency Medicine

## 2020-04-18 ENCOUNTER — Other Ambulatory Visit: Payer: Self-pay

## 2020-04-18 ENCOUNTER — Ambulatory Visit: Payer: Medicare PPO | Admitting: Emergency Medicine

## 2020-04-18 VITALS — BP 154/82 | HR 60 | Temp 98.0°F | Ht 64.0 in | Wt 222.2 lb

## 2020-04-18 DIAGNOSIS — J984 Other disorders of lung: Secondary | ICD-10-CM | POA: Diagnosis not present

## 2020-04-18 DIAGNOSIS — R0602 Shortness of breath: Secondary | ICD-10-CM | POA: Diagnosis not present

## 2020-04-18 DIAGNOSIS — G4733 Obstructive sleep apnea (adult) (pediatric): Secondary | ICD-10-CM

## 2020-04-18 NOTE — Addendum Note (Signed)
Addended by: Gavin Potters R on: 04/18/2020 03:15 PM   Modules accepted: Orders

## 2020-04-18 NOTE — Assessment & Plan Note (Signed)
Severe OSA.  She is using CPAP since July, nasal pillows.  Some difficulty tolerating but it seems that her main issue is sleep maintenance, waking up dyspneic.  It does not necessarily sound like a mask or CPAP issue, question whether some of this is restriction due to her hemidiaphragm paralysis.  She may benefit from sleeping more upright.  Because she has difficulty with sleep initiation and maintenance I think she should see one of our sleep physicians at least once to discuss and troubleshoot.  I will make that referral today.

## 2020-04-18 NOTE — Progress Notes (Signed)
Subjective:    Patient ID: ERCELLE WINKLES, female    DOB: 1955-09-23, 64 y.o.   MRN: [id number]  HPI  ROVB 08/12/19 --this is a follow-up visit for 64 year old woman with history of obesity, OSA (not on CPAP), NASH cirrhosis, aortic stenosis with an AVR, Sjogren's syndrome.  Pulmonary function testing from 12/2018 showed restrictive lung disease.  She underwent a sniff test on 01/09/2019 that showed mild to moderate elevation of right hemidiaphragm and evidence for hemiparesis.  She participated in cardiopulmonary rehab at the end of 2020, was truncated due to [place], may have helped her.   She was seen by the [facility] at [facility] 06/16/2019 to further evaluate her dyspnea and and try to discern whether pulmonary hypertension may be driving progressive symptoms versus her known restrictive lung disease, deconditioning. Potential contributors to her Te[place]ita > her valvular disease, portal pressures from her underlying liver disease.  Wt stable since our last visit.   They are considering a right heart catheterization and possibly a cardiopulmonary exercise test to differentiate between cardiac and pulmonary contributions to her dyspnea. She is concerned that she cannot walk very long - she may be able top 10 minutes at her pace.   TTE from 12/26/18 reviewed > shows normal RV size and fxn, slightly dilated RA; her estimated PASP in 05/2018 was 31 mmHg.   ROV 01/04/20 --64 year old woman with a history of OSA, NASH cirrhosis, aortic stenosis/AVR, Sjogren's.  Also with mild to moderate right hemidiaphragmatic elevation/paresis.  She has been evaluated for PAH.  She had untreated obstructive sleep apnea and was seen in May, CPAP was initiated beginning mid July. She has been wearing reliably. Believes that her cough has increased since starting it. She has been having increase PND and allergy sx. Very good compliance, 100% of the night. She does feel some clinical benefit, longer sleep, easier to get to sleep, but she still  is exhausted during the day. She does not nap.  She still has exertional SOB, especially in the heat. She uses albuterol with some brief relief.  She is interested in possibly going back to Pulm rehab   ROV 04/18/20 --follow-up visit 64 year old woman with history of obstructive sleep apnea, [place] cirrhosis, aortic stenosis/AVR, Sjogren's syndrome, elevated right hemidiaphragm/paresis.  She has secondary pulmonary hypertension.  She has mild obstruction based on pulmonary function testing with some intermittent improvement with albuterol. Based on our PSG she restarted CPAP in July. She feels that she is probably getting some benefit, but she wakes up frequently. She may get some brief relief from albuterol, uses it about 3-4x a week. Her CPEX 02/15/20 showed restrictive lung disease, mild obstruction, no desaturations or clear evidence to support PAH.    Review of Systems  Constitutional: Negative for fever and unexpected weight change.  HENT: Negative for congestion, dental problem, ear pain, nosebleeds, postnasal drip, rhinorrhea, sinus pressure, sneezing, sore throat and trouble swallowing.   Eyes: Negative for redness and itching.  Respiratory: Positive for shortness of breath. Negative for cough, chest tightness and wheezing.   Cardiovascular: Negative for palpitations and leg swelling.  Gastrointestinal: Negative for nausea and vomiting.  Genitourinary: Negative for dysuria.  Musculoskeletal: Negative for joint swelling.  Skin: Negative for rash.  Neurological: Negative for headaches.  Hematological: Does not bruise/bleed easily.  Psychiatric/Behavioral: Negative for dysphoric mood. The patient is not nervous/anxious.      Past Medical History:  Diagnosis Date  . Allergic rhinitis   . Anemia    hx  .  Arthritis   . Asthma    hx yrs ago  . Cirrhosis ([place]) last albumin 3.3 done at [facility] 06-16-2014 (under care everywhere tab in epic)   Secondary to Fatty liver --  followed by  hepatology at [facility] (dr Gerald Dexter)   . Depression   . Diabetes mellitus type II    type 2 diet conrolled  . Dyspnea   . Fibromyalgia   . GERD (gastroesophageal reflux disease)   . Heart murmur    asymptomatic ---  1989 from rhuematic fever  . History of exercise stress test    05-05-2013---  negative bruce ETT given exercise workload,  no ischemia  . History of hiatal hernia   . History of kidney stones   . History of rheumatic fever    1989  . Hyperlipidemia   . Leukocytopenia   . Moderate aortic stenosis    AVA area 1.1cm2---  cardiologist --  dr Concepcion Living, 2014 in epic  . NASH (nonalcoholic steatohepatitis)   . OSA (obstructive sleep apnea)    was using CPAP before gastric sleeve 2015--  no uses after wt loss  . Pneumonia    hx  . Sjogren's syndrome ([place])   . Thrombocytopenia ([place])      Family History  Problem Relation Age of Onset  . Alzheimer's disease Father   . Hip fracture Father   . Asthma Father   . Heart disease Father   . Heart attack Father   . Hypertension Father   . Rheum arthritis Mother   . Heart disease Mother   . Allergies Daughter   . Asthma Paternal Grandmother   . Asthma Daughter   . Stroke Neg Hx      Social History   Socioeconomic History  . Marital status: Widowed    Spouse name: Married to [employer]  . Number of children: Not on file  . Years of education: Not on file  . Highest education level: Not on file  Occupational History  . Occupation: principal of [employer]  Tobacco Use  . Smoking status: Never Smoker  . Smokeless tobacco: Never Used  Vaping Use  . Vaping Use: Never used  Substance and Sexual Activity  . Alcohol use: Yes    Comment: rarely  . Drug use: No  . Sexual activity: Not on file  Other Topics Concern  . Not on file  Social History Narrative  . Not on file   Social Determinants of Health   Financial Resource Strain: Not on file  Food Insecurity: Not on file  Transportation Needs: Not on file   Physical Activity: Not on file  Stress: Not on file  Social Connections: Not on file  Intimate Partner Violence: Not on file     Allergies  Allergen Reactions  . Doxycycline Hives and Rash  . Naproxen Rash and Hives     Outpatient Medications Prior to Visit  Medication Sig Dispense Refill  . albuterol (VENTOLIN HFA) 108 (90 Base) MCG/ACT inhaler Inhale 2 puffs into the lungs every 4 (four) hours as needed for wheezing or shortness of breath. 18 g 5  . amoxicillin (AMOXIL) 500 MG capsule Take 4 capsules 1 hour prior to any dental procedures 4 capsule 3  . aspirin EC 81 MG EC tablet Take 1 tablet (81 mg total) by mouth daily.    Marland Kitchen ezetimibe (ZETIA) 10 MG tablet Take 10 mg by mouth at bedtime.   11  . milk thistle 175 MG tablet Take 175  mg by mouth daily.    Marland Kitchen OVER THE COUNTER MEDICATION Take 1 packet by mouth 2 (two) times daily. Bariatric Multivitamin    . pyridOXINE (B-6) 50 MG tablet Take by mouth.     No facility-administered medications prior to visit.        Objective:   Physical Exam Vitals:   04/18/20 1352  BP: (!) 154/82  Pulse: 60  Temp: 98 F (36.7 C)  SpO2: 96%  Weight: 222 lb 3.2 oz (100.8 kg)  Height: 5' 4"  (1.626 m)   Gen: Pleasant, overwt woman, in no distress,  normal affect  ENT: No lesions,  mouth clear,  oropharynx clear, no postnasal drip  Neck: No JVD, no stridor  Lungs: No use of accessory muscles, clear, better BS on the L than on the R  Cardiovascular: RRR, systolic M with intact S1 and S2  Musculoskeletal: No deformities, no cyanosis or clubbing  Neuro: alert, awake, non focal  Skin: Warm, no lesions or rash      Assessment & Plan:  Dyspnea Shortness of breath is multifactorial, significant contribution from restrictive lung disease in the setting of her weight and also her hemidiaphragm paresis.  Mild obstruction may be present, difficult to say whether she is getting any benefit from albuterol but I have asked her to continue to  use it intermittently for symptoms.  No clear indication for maintenance bronchodilator therapy.  No significant gas exchange defect on her CPX to support PAH as a cause.  She does have secondary PAH due to underlying lung disease and also severe obstructive sleep apnea (started treatment in July).  I think she is deconditioned, needs to go back to cardiopulmonary rehab.  We will try to arrange.  OSA (obstructive sleep apnea) Severe OSA.  She is using CPAP since July, nasal pillows.  Some difficulty tolerating but it seems that her main issue is sleep maintenance, waking up dyspneic.  It does not necessarily sound like a mask or CPAP issue, question whether some of this is restriction due to her hemidiaphragm paralysis.  She may benefit from sleeping more upright.  Because she has difficulty with sleep initiation and maintenance I think she should see one of our sleep physicians at least once to discuss and troubleshoot.  I will make that referral today.  Baltazar Apo, MD, PhD 04/18/2020, 2:45 PM [facility] [phone] or if no answer [phone]

## 2020-04-18 NOTE — Assessment & Plan Note (Signed)
Shortness of breath is multifactorial, significant contribution from restrictive lung disease in the setting of her weight and also her hemidiaphragm paresis.  Mild obstruction may be present, difficult to say whether she is getting any benefit from albuterol but I have asked her to continue to use it intermittently for symptoms.  No clear indication for maintenance bronchodilator therapy.  No significant gas exchange defect on her CPX to support PAH as a cause.  She does have secondary PAH due to underlying lung disease and also severe obstructive sleep apnea (started treatment in July).  I think she is deconditioned, needs to go back to cardiopulmonary rehab.  We will try to arrange.

## 2020-04-18 NOTE — Patient Instructions (Signed)
We will refer you back to cardiopulmonary rehab Keep your albuterol available use 2 puffs when needed for shortness of breath We will refer you to see Sleep Medicine regarding your sleep disturbance, difficulty initiating sleep and maintaining sleep. You may benefit from sleeping in an upright position at 30-45 degrees given your elevated hemidiaphragm and associated shortness of breath.  This may be causing shortness of breath and perturbing your sleep Follow with Dr Lamonte Sakai in 6 months or sooner if you have any problems

## 2020-04-22 ENCOUNTER — Telehealth (HOSPITAL_COMMUNITY): Payer: Self-pay

## 2020-04-22 NOTE — Telephone Encounter (Signed)
Called and spoke with pt in regards to PR, pt stated she is interested. Adv pt we are scheduling for Feb and we will contact her in Jan to schedule.

## 2020-04-27 ENCOUNTER — Encounter (HOSPITAL_COMMUNITY): Payer: Self-pay | Admitting: *Deleted

## 2020-04-27 DIAGNOSIS — G4733 Obstructive sleep apnea (adult) (pediatric): Secondary | ICD-10-CM | POA: Diagnosis not present

## 2020-04-27 NOTE — Progress Notes (Signed)
Received referral from Dr. Lamonte Sakai for this pt to participate in pulmonary rehab again with the the diagnosis of Restrictive Lung disease. Pt is well known to both cardiac and pulmonary rehab staff. Pt participated in cardiac rehab in 2019 with the completion of 25 sessions and in 2020 pt completed 10 sessions with pulmonary rehab. Dr. Lamonte Sakai feels [name] will benefit from participating in pulmonary rehab again.  Clinical review of pt follow up appt on 12/13 Pulmonary office note.  Pt with Covid Risk Score - 3. Pt appropriate for scheduling for Pulmonary rehab.  Will forward to support staff for scheduling and verification of insurance eligibility/benefits with pt consent. Cherre Huger, BSN Cardiac and Training and development officer

## 2020-05-09 DIAGNOSIS — E119 Type 2 diabetes mellitus without complications: Secondary | ICD-10-CM | POA: Diagnosis not present

## 2020-05-19 ENCOUNTER — Telehealth: Payer: Self-pay | Admitting: Emergency Medicine

## 2020-05-19 NOTE — Telephone Encounter (Signed)
disregard

## 2020-05-19 NOTE — Telephone Encounter (Signed)
Dr. Lamonte Sakai, Please see patient message and advise.  Thank you.

## 2020-05-19 NOTE — Telephone Encounter (Signed)
Whitney Booze, MD  You; Drue Novel I, RN 3 minutes ago (10:39 AM)     I spoke to the patient and tried to reassure her that this was overall a low risk scenario. Father of the child was asymptomatic at the time. No prophylactic meds available at this time.   JV

## 2020-05-20 NOTE — Telephone Encounter (Signed)
Thank you for letting us know. I believe that she is probably safe given that her son hasn't developed symptoms, and this wasn't a direct exposure to her. She needs to be on lookout for fever, sore throat, increase in her drainage - standard cold symptoms are the most likely sx she would get since she is vaxxed.   Beyond that she doesn't need to do anything else other than be diligent with her mask, her hand washing and distancing. If her son were to test positive, then we would need to consider testing her as an asymptomatic carrier to prevent spread. Obviously if she develops URI symptoms, we'll probably want to test her for COVID whether her son gets sick or not.

## 2020-05-24 ENCOUNTER — Telehealth (HOSPITAL_COMMUNITY): Payer: Self-pay | Admitting: *Deleted

## 2020-05-26 ENCOUNTER — Telehealth (HOSPITAL_COMMUNITY): Payer: Self-pay

## 2020-05-26 ENCOUNTER — Encounter (HOSPITAL_COMMUNITY): Payer: Self-pay

## 2020-05-26 NOTE — Telephone Encounter (Signed)
Pt insurance is active and benefits verified through [place] $20, DED 0/0 met, out of pocket $4,000/0 met, co-insurance 0%. no pre-authorization required. Passport, [employer] 12/22/5629<[id number]>_7<\/[id number]>_9 :07pm, REF# [id number]

## 2020-05-28 DIAGNOSIS — G4733 Obstructive sleep apnea (adult) (pediatric): Secondary | ICD-10-CM | POA: Diagnosis not present

## 2020-06-06 ENCOUNTER — Telehealth (HOSPITAL_COMMUNITY): Payer: Self-pay | Admitting: Family Medicine

## 2020-06-07 DIAGNOSIS — R5383 Other fatigue: Secondary | ICD-10-CM | POA: Diagnosis not present

## 2020-06-07 DIAGNOSIS — K7460 Unspecified cirrhosis of liver: Secondary | ICD-10-CM | POA: Diagnosis not present

## 2020-06-07 DIAGNOSIS — I272 Pulmonary hypertension, unspecified: Secondary | ICD-10-CM | POA: Diagnosis not present

## 2020-06-07 DIAGNOSIS — R161 Splenomegaly, not elsewhere classified: Secondary | ICD-10-CM | POA: Diagnosis not present

## 2020-06-07 DIAGNOSIS — E119 Type 2 diabetes mellitus without complications: Secondary | ICD-10-CM | POA: Diagnosis not present

## 2020-06-07 DIAGNOSIS — K7581 Nonalcoholic steatohepatitis (NASH): Secondary | ICD-10-CM | POA: Diagnosis not present

## 2020-06-07 DIAGNOSIS — Z79899 Other long term (current) drug therapy: Secondary | ICD-10-CM | POA: Diagnosis not present

## 2020-06-07 DIAGNOSIS — K766 Portal hypertension: Secondary | ICD-10-CM | POA: Diagnosis not present

## 2020-06-07 DIAGNOSIS — I8500 Esophageal varices without bleeding: Secondary | ICD-10-CM | POA: Diagnosis not present

## 2020-06-17 DIAGNOSIS — M3500 Sicca syndrome, unspecified: Secondary | ICD-10-CM | POA: Diagnosis not present

## 2020-06-17 DIAGNOSIS — K7581 Nonalcoholic steatohepatitis (NASH): Secondary | ICD-10-CM | POA: Diagnosis not present

## 2020-06-17 DIAGNOSIS — E559 Vitamin D deficiency, unspecified: Secondary | ICD-10-CM | POA: Diagnosis not present

## 2020-06-17 DIAGNOSIS — D696 Thrombocytopenia, unspecified: Secondary | ICD-10-CM | POA: Diagnosis not present

## 2020-06-17 DIAGNOSIS — E7800 Pure hypercholesterolemia, unspecified: Secondary | ICD-10-CM | POA: Diagnosis not present

## 2020-06-17 DIAGNOSIS — Z0000 Encounter for general adult medical examination without abnormal findings: Secondary | ICD-10-CM | POA: Diagnosis not present

## 2020-06-17 DIAGNOSIS — E1169 Type 2 diabetes mellitus with other specified complication: Secondary | ICD-10-CM | POA: Diagnosis not present

## 2020-06-17 DIAGNOSIS — K7460 Unspecified cirrhosis of liver: Secondary | ICD-10-CM | POA: Diagnosis not present

## 2020-06-20 ENCOUNTER — Other Ambulatory Visit: Payer: Self-pay | Admitting: Family Medicine

## 2020-06-20 DIAGNOSIS — H16223 Keratoconjunctivitis sicca, not specified as Sjogren's, bilateral: Secondary | ICD-10-CM | POA: Diagnosis not present

## 2020-06-20 DIAGNOSIS — E119 Type 2 diabetes mellitus without complications: Secondary | ICD-10-CM | POA: Diagnosis not present

## 2020-06-20 DIAGNOSIS — G510 Bell's palsy: Secondary | ICD-10-CM | POA: Diagnosis not present

## 2020-06-20 DIAGNOSIS — E2839 Other primary ovarian failure: Secondary | ICD-10-CM

## 2020-06-20 DIAGNOSIS — Z1231 Encounter for screening mammogram for malignant neoplasm of breast: Secondary | ICD-10-CM

## 2020-06-20 DIAGNOSIS — M3500 Sicca syndrome, unspecified: Secondary | ICD-10-CM | POA: Diagnosis not present

## 2020-06-28 DIAGNOSIS — G4733 Obstructive sleep apnea (adult) (pediatric): Secondary | ICD-10-CM | POA: Diagnosis not present

## 2020-07-07 ENCOUNTER — Telehealth (HOSPITAL_COMMUNITY): Payer: Self-pay | Admitting: *Deleted

## 2020-07-11 ENCOUNTER — Encounter (HOSPITAL_COMMUNITY)
Admission: RE | Admit: 2020-07-11 | Discharge: 2020-07-11 | Disposition: A | Discharge status: Home / Self Care | Payer: Medicare PPO | Source: Ambulatory Visit | Attending: Emergency Medicine | Admitting: Emergency Medicine

## 2020-07-11 ENCOUNTER — Other Ambulatory Visit: Payer: Self-pay

## 2020-07-11 ENCOUNTER — Encounter (HOSPITAL_COMMUNITY): Payer: Self-pay

## 2020-07-11 VITALS — BP 152/78 | HR 65 | Ht 64.0 in | Wt 221.8 lb

## 2020-07-11 DIAGNOSIS — J984 Other disorders of lung: Secondary | ICD-10-CM | POA: Diagnosis not present

## 2020-07-11 NOTE — Progress Notes (Signed)
Whitney [name] 65 y.o. female Pulmonary Rehab Orientation Note This patient who was referred to Pulmonary rehab by Dr. Vaughan Browner with the diagnosis of Restrictive lung disease arrived today in Cardiac and Pulmonary Rehab. She arrived ambulatory with a limp but steady gait. Pt states she was having knee pain today that she thought was due to "tweaking" it sometime recently. Pt continued to complain of knee pain that seemed to get worse with any activity. Rated pain 6/10. Patient wanted to continue with orientation but we decided to hold on 6 minute walk test due to progressive knee pain. Instructed pt to contact PCP and get knee examined before we continue with 6 minute walk test. Notified department RN and she agreed to hold on walk test. She does not carry portable oxygen. Per pt, she uses oxygen at night when going to sleep. Color good, skin warm and dry. Patient is oriented to time and place. Patient's medical history, psychosocial health, and medications reviewed. Psychosocial assessment reveals pt lives with their son. Pt is a retired principal. Pt hobbies include reading and painting. Pt reports her stress level is moderate. Areas of stress/anxiety include Health. Pt does exhibit signs of depression. Signs of depression include sadness, difficulty falling asleep and difficulty maintaining sleep. PHQ2/9 score 2/6. Pt shows fair coping skills with positive outlook. Whitney [name] was offered emotional support and reassurance. Pt is not currently on any treatment and refuses to be referred for treatment at this time. Will continue to monitor and evaluate progress toward psychosocial goal(s) of improving quality of life and mental well being. Patient reports she does take medications as prescribed. Patient states she follows a high protein diet. The patient reports she wants to lose weight but has not been actively trying to do so. Patient's weight will be monitored closely. Demonstration and practice of PLB using pulse  oximeter. Patient able to return demonstration satisfactorily. Safety and hand hygiene in the exercise area reviewed with patient. Patient voices understanding of the information reviewed. Department expectations discussed with patient and achievable goals were set. The patient shows enthusiasm about attending the program and we look forward to working with this nice lady. The patient is to call with results about knee and we will plan to do walk test on first day of exercise 07/19/20 if pain has subsided.   45 minutes was spent on a variety of activities such as assessment of the patient, obtaining baseline data including height, weight, BMI, and grip strength, verifying medical history, allergies, and current medications, and teaching patient strategies for performing tasks with less respiratory effort with emphasis on pursed lip breathing.  Rick Duff MS, ACSM CEP

## 2020-07-11 NOTE — Progress Notes (Signed)
Whitney [name] 65 y.o. female Pulmonary Rehab Physical Assessment Orientation Note  Physical assessment reveals heart rate is normal, breath sounds clear to auscultation, no wheezes, rales, or rhonchi but diminished throughout.  Pt unable to breath deeply. Grip strength equal, strong. Distal pulses palpable with trace swelling to her ankles.  Whitney [name] is wearing compression socks.  Positive bowel sounds all four quadrants.  Pt denies any GI complaints other than her chronic liver tenderness upon deep palpitation. Whitney [name] who has participated in cardiac and pulmonary rehab is looking forward to returning and getting back to a consistent routine of exercise and losing weight.  Whitney [name] will return next week to complete her 6 minute walk test.  Pt with chronic knee discomfort that she rates a 5.  Plans to alert her doctor for further instructions.Whitney [name], BSN Cardiac and Training and development officer

## 2020-07-17 NOTE — Progress Notes (Signed)
Cardiology Office Note   Date:  07/18/2020   ID:  Whitney [name], DOB 1956/01/15, MRN [id number]  PCP:  Hulan Fess, MD    No chief complaint on file.  S/p AVR  Wt Readings from Last 3 Encounters:  07/18/20 221 lb (100.2 kg)  07/11/20 221 lb 12.5 oz (100.6 kg)  04/18/20 222 lb 3.2 oz (100.8 kg)       History of Present Illness: Whitney [name] is a 65 y.o. female  with a past medical history significant for NASHand chronic thrombocytopeniatreated at [facility], gastric sleeve surgery 2015 with 100 lb wt loss, aortic stenosis s/p tissue AVR 12/05/2017, DM type 2, hyperlipidemia.  Ms Demby had known aortic stenosis that had progressed with symptoms of exertional fatigue andtiredness. Echocardiogram in April 2019 showed a mean gradient of 41 mmHg and a peak gradient of 80 mmHg.LV systolic function was normal. She was evaluated by her liver specialist who felt there was no contraindication to proceeding with an aortic valve replacement. She underwent tissue AVR on 12/05/2017.She developed brief runs of atrial fibrillation postoperatively.She chemically converted back to sinus rhythm with amiodarone and Lopressor was increased to 25 mg twice daily. She had postoperative volume overload which is improved. She has chronic thrombocytopenia and platelets were stable at 54,000 in the hospital.  Since the last visit, she had intermittent shortness of breath.  She has had paralysis of the right hemidiaphragm.   She thinks this happened at the time of the chest tube removal.    She still has intermittent shortness of breath.  There was a concern of portopulmonary HTN, Sjogrens.  She has gained weight as well.  She got her COVID vaccines. CPX test was considered: "Multiple possible etiologies including pulmonary hypertension related to her liver disease, autoimmune disease.  I reviewed the records from [place].  CPX testing was considered as well as right heart catheterization.  Will get input  from Dr. Lamonte Sakai as well to see how best to schedule the CPX test.  We can arrange potentially in a heart failure clinic as well.  I encouraged her to try to increase her exercise gradually.  She states she does not like to exercise.  She does not go out for regular walks.  I think just some routine activity would be helpful to try to increase her stamina."  The fatigue has been progressive.  She is concerned about her liver disease and her heart. She has leg cramps.   She does try to be active with pulmonary rehab.  Her diaphragm issue is concerning to her.   Echo at [facility] 2/21: Morphology: Normal   Mobility: Fully Mobile   AOV Note: 7m INSPIRIS   LEFT VENTRICLE                   Anterior: Normal     Size: Normal                 Lateral: Normal  Contraction: Normal                 Septal: Normal  Closest EF: >55%(Estimated) Calc.EF: 59% (3D)   Apical: Normal   LV masses: No Masses               Inferior: Normal     Past Medical History:  Diagnosis Date  . Allergic rhinitis   . Anemia    hx  . Arthritis   . Asthma    hx yrs ago  .  Cirrhosis ([place]) last albumin 3.3 done at [facility] 06-16-2014 (under care everywhere tab in epic)   Secondary to Fatty liver --  followed by hepatology at [facility] (dr Gerald Dexter)   . Depression   . Diabetes mellitus type II    type 2 diet conrolled  . Dyspnea   . Fibromyalgia   . GERD (gastroesophageal reflux disease)   . Heart murmur    asymptomatic ---  1989 from rhuematic fever  . History of exercise stress test    05-05-2013---  negative bruce ETT given exercise workload,  no ischemia  . History of hiatal hernia   . History of kidney stones   . History of rheumatic fever    1989  . Hyperlipidemia   . Leukocytopenia   . Moderate aortic stenosis    AVA area 1.1cm2---  cardiologist --  dr Concepcion Living, 2014 in epic  . NASH (nonalcoholic steatohepatitis)   . OSA  (obstructive sleep apnea)    was using CPAP before gastric sleeve 2015--  no uses after wt loss  . Pneumonia    hx  . Sjogren's syndrome ([place])   . Thrombocytopenia ([place])     Past Surgical History:  Procedure Laterality Date  . AORTIC VALVE REPLACEMENT N/A 12/05/2017   Procedure: AORTIC VALVE REPLACEMENT (AVR) TISSUE VALVE 21MM INSPIRIS;  Surgeon: Gaye Pollack, MD;  Location: [place];  Service: Open Heart Surgery;  Laterality: N/A;  . COLONOSCOPY WITH ESOPHAGOGASTRODUODENOSCOPY (EGD)    . CYSTOSCOPY WITH RETROGRADE PYELOGRAM, URETEROSCOPY AND STENT PLACEMENT Left 01/21/2015   Procedure: CYSTOSCOPY WITH LEFT  RETROGRADE PYELOGRAM, LEFT URETEROSCOPY AND STENT PLACEMENT;  Surgeon: Festus Aloe, MD;  Location: [facility];  Service: Urology;  Laterality: Left;  . CYSTOSCOPY WITH RETROGRADE PYELOGRAM, URETEROSCOPY AND STENT PLACEMENT Bilateral 02/24/2015   Procedure: CYSTOSCOPY WITH RIGHT RETROGRADE PYELOGRAM, BLADDER BIOPSY FULGERATION LEFT URETEROSCOPY AND STENT REPLACEMENT;  Surgeon: Festus Aloe, MD;  Location: [facility]  Service: Urology;  Laterality: Bilateral;  . EXPLORATORY LAPAROSCOPY W/  CONE BIOPSY'S LEFT AND RIGHT LOBE OF LIVER  11-04-2007  . HOLMIUM LASER APPLICATION Left 03/55/9741   Procedure: HOLMIUM LASER LITHOTRIPSY;  Surgeon: Festus Aloe, MD;  Location: [facility];  Service: Urology;  Laterality: Left;  . HYSTEROSCOPY WITH D & C N/A 12/11/2012   Procedure: DILATATION AND CURETTAGE /HYSTEROSCOPY;  Surgeon: Maeola Sarah. Landry Mellow, MD;  Location: [facility];  Service: Gynecology;  Laterality: N/A;  . INGUINAL HERNIA REPAIR Left 10/22/2016   Procedure: LEFT INGUINAL HERNIA REPAIR;  Surgeon: Rolm Bookbinder, MD;  Location: [place];  Service: General;  Laterality: Left;  TAP BLOCK  . INSERTION OF MESH Left 10/22/2016   Procedure: INSERTION OF MESH;  Surgeon: Rolm Bookbinder, MD;  Location: [place];  Service: General;  Laterality: Left;  .  LAPAROSCOPIC GASTRIC SLEEVE RESECTION  07-27-2013  . PUBOVAGINAL SLING  04-10-2001   [place]  . RIGHT/LEFT HEART CATH AND CORONARY ANGIOGRAPHY N/A 08/23/2017   Procedure: RIGHT/LEFT HEART CATH AND CORONARY ANGIOGRAPHY;  Surgeon: [name], MD;  Location: [place] [facility];  Service: Cardiovascular;  Laterality: N/A;  . TEE WITHOUT CARDIOVERSION N/A 12/05/2017   Procedure: TRANSESOPHAGEAL ECHOCARDIOGRAM (TEE);  Surgeon: Gaye Pollack, MD;  Location: [place];  Service: Open Heart Surgery;  Laterality: N/A;  . TONSILLECTOMY  1975  . TRANSTHORACIC ECHOCARDIOGRAM  06-04-2012  dr Irish Lack   mild LVH,  grade I diastolic dysfunction/  ef 60-65%/  moderate LAE/  mild MV calcifation without stenosis,  mild MR/  moderate AV stenosis,  cannot r/o bicupsid, area 1.1cm2/  mild dilated aortic root/  trivial TR     Current Outpatient Medications  Medication Sig Dispense Refill  . albuterol (VENTOLIN HFA) 108 (90 Base) MCG/ACT inhaler Inhale 2 puffs into the lungs every 4 (four) hours as needed for wheezing or shortness of breath. 18 g 5  . amoxicillin (AMOXIL) 500 MG capsule Take 4 capsules 1 hour prior to any dental procedures 4 capsule 3  . cholecalciferol (D-VI-SOL) 10 MCG/ML LIQD Take 25 mcg by mouth daily.    . cyanocobalamin 1000 MCG tablet Take 1,000 mcg by mouth daily.    Marland Kitchen ezetimibe (ZETIA) 10 MG tablet Take 10 mg by mouth at bedtime.   11  . milk thistle 175 MG tablet Take 175 mg by mouth daily.    . nadolol (CORGARD) 20 MG tablet Take 20 mg by mouth daily.    Marland Kitchen OVER THE COUNTER MEDICATION Take 1 packet by mouth 2 (two) times daily. Bariatric Multivitamin    . pyridOXINE (B-6) 50 MG tablet Take by mouth.    . TURMERIC PO Take by mouth.     No current facility-administered medications for this visit.    Allergies:   Doxycycline and Naproxen    Social History:  The patient  reports that she has never smoked. She has never used smokeless tobacco. She reports current alcohol use. She  reports that she does not use drugs.   Family History:  The patient's family history includes Allergies in her daughter; Alzheimer's disease in her father; Asthma in her daughter, father, and paternal grandmother; Heart attack in her father; Heart disease in her father and mother; Hip fracture in her father; Hypertension in her father; Rheum arthritis in her mother.    ROS:  Please see the history of present illness.   Otherwise, review of systems are positive for weight gain, decreased activity.   All other systems are reviewed and negative.    PHYSICAL EXAM: VS:  BP 140/80 (BP Location: Left Arm, Patient Position: Sitting, Cuff Size: Normal)   Pulse 67   Ht 5' 4"  (1.626 m)   Wt 221 lb (100.2 kg)   SpO2 95%   BMI 37.93 kg/m  , BMI Body mass index is 37.93 kg/m. GEN: Well nourished, well developed, in no acute distress  HEENT: normal  Neck: no JVD, carotid bruits, or masses Cardiac: RRR; 2/6 systolic murmur, no rubs, or gallops,no edema  Respiratory:  clear to auscultation bilaterally, normal work of breathing GI: soft, nontender, nondistended, + BS MS: no deformity or atrophy ; spider veins on both legs Skin: warm and dry, no rash Neuro:  Strength and sensation are intact Psych: euthymic mood, full affect   EKG:   The ekg ordered today demonstrates NSR, no ST changes   Recent Labs: No results found for requested labs within last 8760 hours.   Lipid Panel No results found for: CHOL, TRIG, HDL, CHOLHDL, VLDL, LDLCALC, LDLDIRECT   Other studies Reviewed: Additional studies/ records that were reviewed today with results demonstrating: labs and echo from [place] reviewed.   ASSESSMENT AND PLAN:  1. S/p AVR: SBE prophylaxis. Functioning well.  Echo in 2021 at [facility] showed normal valve function.  2. PAF: In NSR.  On nadolol for varices. Followed at [facility].  3. Hyperlipidemia: LDL 121 in 2022 at [facility] 4. Chronic thrombocytopenia:  Related to liver disease. 5. SHOB: Chronic.   Pulmonary rehab started.  6. NASH: followed at  [facility].    Current medicines are reviewed at length with the patient today.  The patient concerns regarding her medicines were addressed.  The following changes have been made:  No change  Labs/ tests ordered today include:  No orders of the defined types were placed in this encounter.   Recommend 150 minutes/week of aerobic exercise Low fat, low carb, high fiber diet recommended  Disposition:   FU in 1 year   Signed, Larae Grooms, MD  07/18/2020 3:32 PM    [facility] [place] [place], [place], [place]  [address] Phone: [phone]; Fax: [phone]

## 2020-07-18 ENCOUNTER — Other Ambulatory Visit: Payer: Self-pay

## 2020-07-18 ENCOUNTER — Telehealth (HOSPITAL_COMMUNITY): Payer: Self-pay

## 2020-07-18 ENCOUNTER — Encounter: Payer: Self-pay | Admitting: Interventional Cardiology

## 2020-07-18 ENCOUNTER — Ambulatory Visit: Payer: Medicare PPO | Admitting: Interventional Cardiology

## 2020-07-18 VITALS — BP 140/80 | HR 67 | Ht 64.0 in | Wt 221.0 lb

## 2020-07-18 DIAGNOSIS — I480 Paroxysmal atrial fibrillation: Secondary | ICD-10-CM

## 2020-07-18 DIAGNOSIS — R0602 Shortness of breath: Secondary | ICD-10-CM | POA: Diagnosis not present

## 2020-07-18 DIAGNOSIS — E119 Type 2 diabetes mellitus without complications: Secondary | ICD-10-CM

## 2020-07-18 DIAGNOSIS — E782 Mixed hyperlipidemia: Secondary | ICD-10-CM

## 2020-07-18 DIAGNOSIS — Z953 Presence of xenogenic heart valve: Secondary | ICD-10-CM

## 2020-07-18 NOTE — Telephone Encounter (Signed)
Called pt to check status of knee pain since last week and to see if she had it examined by a doctor. Holding on starting exercise until I hear from her. LMTCB

## 2020-07-18 NOTE — Patient Instructions (Signed)

## 2020-07-19 ENCOUNTER — Encounter (HOSPITAL_COMMUNITY)
Admission: RE | Admit: 2020-07-19 | Discharge: 2020-07-19 | Disposition: A | Discharge status: Home / Self Care | Payer: Medicare PPO | Source: Ambulatory Visit | Attending: Emergency Medicine | Admitting: Emergency Medicine

## 2020-07-19 DIAGNOSIS — J984 Other disorders of lung: Secondary | ICD-10-CM | POA: Diagnosis not present

## 2020-07-19 NOTE — Progress Notes (Signed)
Pulmonary Individual Treatment Plan  Patient Details  Name: Whitney [name] MRN: [id number] Date of Birth: 02/08/1956 Referring Provider:   April Manson Pulmonary Rehab Walk Test from 07/19/2020 in [place]  Referring Provider Dr. Lamonte Sakai      Initial Encounter Date:  Flowsheet Row Pulmonary Rehab Walk Test from 07/19/2020 in [place]  Date 07/19/20      Visit Diagnosis: Restrictive lung disease  Patient's Home Medications on Admission:   Current Outpatient Medications:    albuterol (VENTOLIN HFA) 108 (90 Base) MCG/ACT inhaler, Inhale 2 puffs into the lungs every 4 (four) hours as needed for wheezing or shortness of breath., Disp: 18 g, Rfl: 5   amoxicillin (AMOXIL) 500 MG capsule, Take 4 capsules 1 hour prior to any dental procedures, Disp: 4 capsule, Rfl: 3   cholecalciferol (D-VI-SOL) 10 MCG/ML LIQD, Take 25 mcg by mouth daily., Disp: , Rfl:    cyanocobalamin 1000 MCG tablet, Take 1,000 mcg by mouth daily., Disp: , Rfl:    ezetimibe (ZETIA) 10 MG tablet, Take 10 mg by mouth at bedtime. , Disp: , Rfl: 11   milk thistle 175 MG tablet, Take 175 mg by mouth daily., Disp: , Rfl:    nadolol (CORGARD) 20 MG tablet, Take 20 mg by mouth daily., Disp: , Rfl:    OVER THE COUNTER MEDICATION, Take 1 packet by mouth 2 (two) times daily. Bariatric Multivitamin, Disp: , Rfl:    pyridOXINE (B-6) 50 MG tablet, Take by mouth., Disp: , Rfl:    TURMERIC PO, Take by mouth., Disp: , Rfl:   Past Medical History: Past Medical History:  Diagnosis Date   Allergic rhinitis    Anemia    hx   Arthritis    Asthma    hx yrs ago   Cirrhosis ([place]) last albumin 3.3 done at [place] 06-16-2014 (under care everywhere tab in epic)   Secondary to Fatty liver --  followed by hepatology at [facility] (dr Gerald Dexter)    Depression    Diabetes mellitus type II    type 2 diet conrolled   Dyspnea    Fibromyalgia    GERD (gastroesophageal  reflux disease)    Heart murmur    asymptomatic ---  1989 from rhuematic fever   History of exercise stress test    05-05-2013---  negative bruce ETT given exercise workload,  no ischemia   History of hiatal hernia    History of kidney stones    History of rheumatic fever    1989   Hyperlipidemia    Leukocytopenia    Moderate aortic stenosis    AVA area 1.1cm2---  cardiologist --  dr Concepcion Living, 2014 in epic   NASH (nonalcoholic steatohepatitis)    OSA (obstructive sleep apnea)    was using CPAP before gastric sleeve 2015--  no uses after wt loss   Pneumonia    hx   Sjogren's syndrome ([place])    Thrombocytopenia ([facility])     Tobacco Use: Social History   Tobacco Use  Smoking Status Never Smoker  Smokeless Tobacco Never Used    Labs: Recent Review Flowsheet Data    Labs for ITP Cardiac and Pulmonary Rehab Latest Ref Rng & Units 12/05/2017 12/05/2017 12/05/2017 12/05/2017 12/17/2018   Hemoglobin A1c 4.8 - 5.6 % - - - - -   PHART 7.350 - 7.450 7.430 7.349(L) - 7.326(L) -   PCO2ART 32.0 - 48.0 mmHg 38.9 41.7 - 43.5 -  HCO3 20.0 - 28.0 mmol/L 25.8 23.2 - 22.8 -   TCO2 22 - 32 mmol/L 27 25 23 24 27    ACIDBASEDEF 0.0 - 2.0 mmol/L - 3.0(H) - 3.0(H) -   O2SAT % 100.0 97.0 - 97.0 -      Capillary Blood Glucose: Lab Results  Component Value Date   GLUCAP 77 12/17/2018   GLUCAP 79 02/12/2018   GLUCAP 100 (H) 12/12/2017   GLUCAP 166 (H) 12/11/2017   GLUCAP 161 (H) 12/11/2017     Pulmonary Assessment Scores:  Pulmonary Assessment Scores    Row Name 07/11/20 1644         ADL UCSD   ADL Phase Entry     SOB Score total 78           CAT Score   CAT Score 36           UCSD: Self-administered rating of dyspnea associated with activities of daily living (ADLs) 6-point scale (0 = "not at all" to 5 = "maximal or unable to do because of breathlessness")  Scoring Scores range from 0 to 120.  Minimally important difference is 5 units  CAT: CAT can identify the  health impairment of COPD patients and is better correlated with disease progression.  CAT has a scoring range of zero to 40. The CAT score is classified into four groups of low (less than 10), medium (10 - 20), high (21-30) and very high (31-40) based on the impact level of disease on health status. A CAT score over 10 suggests significant symptoms.  A worsening CAT score could be explained by an exacerbation, poor medication adherence, poor inhaler technique, or progression of COPD or comorbid conditions.  CAT MCID is 2 points  mMRC: mMRC (Modified Medical Research Council) Dyspnea Scale is used to assess the degree of baseline functional disability in patients of respiratory disease due to dyspnea. No minimal important difference is established. A decrease in score of 1 point or greater is considered a positive change.   Pulmonary Function Assessment:  Pulmonary Function Assessment - 07/11/20 1119      Breath   Shortness of Breath Yes;Limiting activity;Fear of Shortness of Breath           Exercise Target Goals: Exercise Program Goal: Individual exercise prescription set using results from initial 6 min walk test and THRR while considering  patients activity barriers and safety.   Exercise Prescription Goal: Initial exercise prescription builds to 30-45 minutes a day of aerobic activity, 2-3 days per week.  Home exercise guidelines will be given to patient during program as part of exercise prescription that the participant will acknowledge.  Activity Barriers & Risk Stratification:  Activity Barriers & Cardiac Risk Stratification - 07/11/20 1111      Activity Barriers & Cardiac Risk Stratification   Activity Barriers Arthritis;Fibromyalgia;Deconditioning;Shortness of Breath;Balance Concerns    Comments sjogren syndrome           6 Minute Walk:  6 Minute Walk    Row Name 07/19/20 1201         6 Minute Walk   Phase Initial     Distance 800 feet     Walk Time 6 minutes      # of Rest Breaks 0     MPH 1.52     METS 1.67     RPE 14     Perceived Dyspnea  3     VO2 Peak 5.84     Symptoms Yes (comment)  Comments Patients oxygen saturation dropped to 84% on RA. Thought this may have been due to her cold hands. Had her stop at minute 4 to perform pursed lip breathing and check pulse ox. Oxygen saturation came back up once I warmed her fingers a little. I think the 84% was an error. We will continue to monitor oxygen saturation and supplemental oxygen needs.     Resting HR 61 bpm     Resting BP 148/80     Resting Oxygen Saturation  95 %     Exercise Oxygen Saturation  during 6 min walk 84 %     Max Ex. HR 85 bpm     Max Ex. BP 160/84     2 Minute Post BP 146/80           Interval HR   1 Minute HR 74     2 Minute HR 82     3 Minute HR 85     4 Minute HR 68  This was when I had her stop to perform pursed lip breathing     5 Minute HR 75     6 Minute HR 65     Interval Heart Rate? Yes           Interval Oxygen   Interval Oxygen? Yes     Baseline Oxygen Saturation % 95 %     1 Minute Oxygen Saturation % 94 %     1 Minute Liters of Oxygen 0 L     2 Minute Oxygen Saturation % 87 %     2 Minute Liters of Oxygen 0 L     3 Minute Oxygen Saturation % 85 %     3 Minute Liters of Oxygen 0 L     4 Minute Oxygen Saturation % 84 %  Stopped and performed pursed lip breathing     4 Minute Liters of Oxygen 0 L     5 Minute Oxygen Saturation % 87 %     5 Minute Liters of Oxygen 0 L     6 Minute Oxygen Saturation % 92 %     6 Minute Liters of Oxygen 0 L     2 Minute Post Oxygen Saturation % 95 %     2 Minute Post Liters of Oxygen 0 L            Oxygen Initial Assessment:  Oxygen Initial Assessment - 07/19/20 1210      Home Oxygen   Home Oxygen Device None    Sleep Oxygen Prescription CPAP    Home Exercise Oxygen Prescription None    Home Resting Oxygen Prescription None    Compliance with Home Oxygen Use Yes      Initial 6 min Walk   Oxygen Used  None      Program Oxygen Prescription   Program Oxygen Prescription None   May possibly need supplemental oxygen with walking     Intervention   Short Term Goals To learn and exhibit compliance with exercise, home and travel O2 prescription;To learn and understand importance of monitoring SPO2 with pulse oximeter and demonstrate accurate use of the pulse oximeter.;To learn and understand importance of maintaining oxygen saturations>88%;To learn and demonstrate proper pursed lip breathing techniques or other breathing techniques.;To learn and demonstrate proper use of respiratory medications    Long  Term Goals Exhibits compliance with exercise, home and travel O2 prescription;Verbalizes importance of monitoring SPO2 with pulse oximeter and return demonstration;Maintenance of O2 saturations>88%;Exhibits proper  breathing techniques, such as pursed lip breathing or other method taught during program session;Compliance with respiratory medication;Demonstrates proper use of MDIs           Oxygen Re-Evaluation:   Oxygen Discharge (Final Oxygen Re-Evaluation):   Initial Exercise Prescription:  Initial Exercise Prescription - 07/19/20 1200      Date of Initial Exercise RX and Referring Provider   Date 07/19/20    Referring Provider Dr. Lamonte Sakai    Expected Discharge Date 09/20/20      NuStep   Level 2    SPM 80    Minutes 15      Track   Minutes 15      Prescription Details   Frequency (times per week) 2    Duration Progress to 30 minutes of continuous aerobic without signs/symptoms of physical distress      Intensity   THRR 40-80% of Max Heartrate 62-123    Ratings of Perceived Exertion 11-13    Perceived Dyspnea 0-4      Progression   Progression Continue to progress workloads to maintain intensity without signs/symptoms of physical distress.      Resistance Training   Training Prescription Yes    Weight orange bands    Reps 10-15           Perform Capillary Blood  Glucose checks as needed.  Exercise Prescription Changes:   Exercise Comments:   Exercise Goals and Review:  Exercise Goals    Row Name 07/11/20 1612             Exercise Goals   Increase Physical Activity Yes       Intervention Provide advice, education, support and counseling about physical activity/exercise needs.;Develop an individualized exercise prescription for aerobic and resistive training based on initial evaluation findings, risk stratification, comorbidities and participant's personal goals.       Expected Outcomes Short Term: Attend rehab on a regular basis to increase amount of physical activity.;Long Term: Add in home exercise to make exercise part of routine and to increase amount of physical activity.;Long Term: Exercising regularly at least 3-5 days a week.       Increase Strength and Stamina Yes       Intervention Provide advice, education, support and counseling about physical activity/exercise needs.;Develop an individualized exercise prescription for aerobic and resistive training based on initial evaluation findings, risk stratification, comorbidities and participant's personal goals.       Expected Outcomes Short Term: Increase workloads from initial exercise prescription for resistance, speed, and METs.;Short Term: Perform resistance training exercises routinely during rehab and add in resistance training at home;Long Term: Improve cardiorespiratory fitness, muscular endurance and strength as measured by increased METs and functional capacity (6MWT)       Able to understand and use rate of perceived exertion (RPE) scale Yes       Intervention Provide education and explanation on how to use RPE scale       Expected Outcomes Short Term: Able to use RPE daily in rehab to express subjective intensity level;Long Term:  Able to use RPE to guide intensity level when exercising independently       Able to understand and use Dyspnea scale Yes       Intervention Provide  education and explanation on how to use Dyspnea scale       Expected Outcomes Short Term: Able to use Dyspnea scale daily in rehab to express subjective sense of shortness of breath during exertion;Long Term: Able to use  Dyspnea scale to guide intensity level when exercising independently       Knowledge and understanding of Target Heart Rate Range (THRR) Yes       Intervention Provide education and explanation of THRR including how the numbers were predicted and where they are located for reference       Expected Outcomes Short Term: Able to state/look up THRR;Long Term: Able to use THRR to govern intensity when exercising independently;Short Term: Able to use daily as guideline for intensity in rehab       Understanding of Exercise Prescription Yes       Intervention Provide education, explanation, and written materials on patient's individual exercise prescription       Expected Outcomes Short Term: Able to explain program exercise prescription;Long Term: Able to explain home exercise prescription to exercise independently              Exercise Goals Re-Evaluation :   Discharge Exercise Prescription (Final Exercise Prescription Changes):   Nutrition:  Target Goals: Understanding of nutrition guidelines, daily intake of sodium <1550m, cholesterol <206m calories 30% from fat and 7% or less from saturated fats, daily to have 5 or more servings of fruits and vegetables.  Biometrics:  Pre Biometrics - 07/19/20 1201      Pre Biometrics   Grip Strength 23 kg            Nutrition Therapy Plan and Nutrition Goals:   Nutrition Assessments:  MEDIFICTS Score Key:  ?70 Need to make dietary changes   40-70 Heart Healthy Diet  ? 40 Therapeutic Level Cholesterol Diet   Picture Your Plate Scores:  <4<41nhealthy dietary pattern with much room for improvement.  41-50 Dietary pattern unlikely to meet recommendations for good health and room for improvement.  51-60 More  healthful dietary pattern, with some room for improvement.   >60 Healthy dietary pattern, although there may be some specific behaviors that could be improved.    Nutrition Goals Re-Evaluation:   Nutrition Goals Discharge (Final Nutrition Goals Re-Evaluation):   Psychosocial: Target Goals: Acknowledge presence or absence of significant depression and/or stress, maximize coping skills, provide positive support system. Participant is able to verbalize types and ability to use techniques and skills needed for reducing stress and depression.  Initial Review & Psychosocial Screening:  Initial Psych Review & Screening - 07/11/20 1120      Initial Review   Current issues with None Identified;Current Stress Concerns    Source of Stress Concerns Chronic Illness      Family Dynamics   Good Support System? Yes   Son and Daughter and grandchildren     Barriers   Psychosocial barriers to participate in program The patient should benefit from training in stress management and relaxation.      Screening Interventions   Interventions Encouraged to exercise           Quality of Life Scores:  Scores of 19 and below usually indicate a poorer quality of life in these areas.  A difference of  2-3 points is a clinically meaningful difference.  A difference of 2-3 points in the total score of the Quality of Life Index has been associated with significant improvement in overall quality of life, self-image, physical symptoms, and general health in studies assessing change in quality of life.  PHQ-9: Recent Review Flowsheet Data    [facility] 07/11/2020 03/11/2019 03/11/2019 04/28/2018 02/12/2018   Decreased Interest 1 1 1  0 0   Down, Depressed,  Hopeless 1 1 1  0 0   PHQ - 2 Score 2 2 2  0 0   Altered sleeping 1 1 1  - -   Tired, decreased energy 2 1 1  - -   Change in appetite 1 0 0 - -   Feeling bad or failure about yourself  0 0 0 - -   Trouble concentrating 0 0 0 - -   Moving slowly  or fidgety/restless 0 0 0 - -   Suicidal thoughts 0 0 0 - -   PHQ-9 Score 6 4 4  - -   Difficult doing work/chores Somewhat difficult Not difficult at all - - -     Interpretation of Total Score  Total Score Depression Severity:  1-4 = Minimal depression, 5-9 = Mild depression, 10-14 = Moderate depression, 15-19 = Moderately severe depression, 20-27 = Severe depression   Psychosocial Evaluation and Intervention:  Psychosocial Evaluation - 07/11/20 1613      Psychosocial Evaluation & Interventions   Interventions Encouraged to exercise with the program and follow exercise prescription    Comments [name] reports that she does feel depressed some days due to her illness and inability to do things she once could. She refuses treatement but has a positive outlook.    Expected Outcomes Improve mental well being and decrease depression symptoms    Continue Psychosocial Services  Follow up required by staff           Psychosocial Re-Evaluation:   Psychosocial Discharge (Final Psychosocial Re-Evaluation):   Education: Education Goals: Education classes will be provided on a weekly basis, covering required topics. Participant will state understanding/return demonstration of topics presented.  Learning Barriers/Preferences:  Learning Barriers/Preferences - 07/11/20 1124      Learning Barriers/Preferences   Learning Barriers None    Learning Preferences Skilled Demonstration;Individual Instruction;Written Material           Education Topics: Risk Factor Reduction:  -Group instruction that is supported by a PowerPoint presentation. Instructor discusses the definition of a risk factor, different risk factors for pulmonary disease, and how the heart and lungs work together.     Nutrition for Pulmonary Patient:  -Group instruction provided by PowerPoint slides, verbal discussion, and written materials to support subject matter. The instructor gives an explanation and review of healthy  diet recommendations, which includes a discussion on weight management, recommendations for fruit and vegetable consumption, as well as protein, fluid, caffeine, fiber, sodium, sugar, and alcohol. Tips for eating when patients are short of breath are discussed.   Pursed Lip Breathing:  -Group instruction that is supported by demonstration and informational handouts. Instructor discusses the benefits of pursed lip and diaphragmatic breathing and detailed demonstration on how to preform both.     Oxygen Safety:  -Group instruction provided by PowerPoint, verbal discussion, and written material to support subject matter. There is an overview of What is Oxygen and Why do we need it.  Instructor also reviews how to create a safe environment for oxygen use, the importance of using oxygen as prescribed, and the risks of noncompliance. There is a brief discussion on traveling with oxygen and resources the patient may utilize.   Oxygen Equipment:  -Group instruction provided by [facility] Staff utilizing handouts, written materials, and equipment demonstrations.   Signs and Symptoms:  -Group instruction provided by written material and verbal discussion to support subject matter. Warning signs and symptoms of infection, stroke, and heart attack are reviewed and when to call the physician/911 reinforced. Tips for  preventing the spread of infection discussed.   Advanced Directives:  -Group instruction provided by verbal instruction and written material to support subject matter. Instructor reviews Advanced Directive laws and proper instruction for filling out document.   Pulmonary Video:  -Group video education that reviews the importance of medication and oxygen compliance, exercise, good nutrition, pulmonary hygiene, and pursed lip and diaphragmatic breathing for the pulmonary patient.   Exercise for the Pulmonary Patient:  -Group instruction that is supported by a PowerPoint presentation.  Instructor discusses benefits of exercise, core components of exercise, frequency, duration, and intensity of an exercise routine, importance of utilizing pulse oximetry during exercise, safety while exercising, and options of places to exercise outside of rehab.     Pulmonary Medications:  -Verbally interactive group education provided by instructor with focus on inhaled medications and proper administration. Flowsheet Row PULMONARY REHAB OTHER RESPIRATORY from 04/23/2019 in [place]  Date 04/23/19  Educator --  [Handout]      Anatomy and Physiology of the Respiratory System and Intimacy:  -Group instruction provided by PowerPoint, verbal discussion, and written material to support subject matter. Instructor reviews respiratory cycle and anatomical components of the respiratory system and their functions. Instructor also reviews differences in obstructive and restrictive respiratory diseases with examples of each. Intimacy, Sex, and Sexuality differences are reviewed with a discussion on how relationships can change when diagnosed with pulmonary disease. Common sexual concerns are reviewed. Flowsheet Row PULMONARY REHAB OTHER RESPIRATORY from 04/23/2019 in [place]  Date 04/07/19  Educator --  [Handout]      MD DAY -A group question and answer session with a medical doctor that allows participants to ask questions that relate to their pulmonary disease state.   OTHER EDUCATION -Group or individual verbal, written, or video instructions that support the educational goals of the pulmonary rehab program.   Holiday Eating Survival Tips:  -Group instruction provided by PowerPoint slides, verbal discussion, and written materials to support subject matter. The instructor gives patients tips, tricks, and techniques to help them not only survive but enjoy the holidays despite the onslaught of food that accompanies the  holidays. Flowsheet Row PULMONARY REHAB OTHER RESPIRATORY from 04/23/2019 in [place]  Date 03/24/19  Educator 03/31/19  [Handout]      Knowledge Questionnaire Score:  Knowledge Questionnaire Score - 07/11/20 1644      Knowledge Questionnaire Score   Pre Score 17/18           Core Components/Risk Factors/Patient Goals at Admission:  Personal Goals and Risk Factors at Admission - 07/11/20 1615      Core Components/Risk Factors/Patient Goals on Admission    Weight Management Weight Loss    Intervention Weight Management: Develop a combined nutrition and exercise program designed to reach desired caloric intake, while maintaining appropriate intake of nutrient and fiber, sodium and fats, and appropriate energy expenditure required for the weight goal.;Obesity: Provide education and appropriate resources to help participant work on and attain dietary goals.;Weight Management/Obesity: Establish reasonable short term and long term weight goals.    Admit Weight 221 lb (100.2 kg)    Goal Weight: Short Term 200 lb (90.7 kg)    Goal Weight: Long Term 180 lb (81.6 kg)    Expected Outcomes Short Term: Continue to assess and modify interventions until short term weight is achieved;Long Term: Adherence to nutrition and physical activity/exercise program aimed toward attainment of established weight goal;Weight Loss: Understanding  of general recommendations for a balanced deficit meal plan, which promotes 1-2 lb weight loss per week and includes a negative energy balance of [id number] kcal/d    Improve shortness of breath with ADL's Yes    Intervention Provide education, individualized exercise plan and daily activity instruction to help decrease symptoms of SOB with activities of daily living.    Expected Outcomes Short Term: Improve cardiorespiratory fitness to achieve a reduction of symptoms when performing ADLs;Long Term: Be able to perform more ADLs without symptoms  or delay the onset of symptoms    Diabetes --   Pre-diabetes   Hypertension Yes    Intervention Provide education on lifestyle modifcations including regular physical activity/exercise, weight management, moderate sodium restriction and increased consumption of fresh fruit, vegetables, and low fat dairy, alcohol moderation, and smoking cessation.;Monitor prescription use compliance.    Expected Outcomes Long Term: Maintenance of blood pressure at goal levels.           Core Components/Risk Factors/Patient Goals Review:    Core Components/Risk Factors/Patient Goals at Discharge (Final Review):    ITP Comments:   Comments:

## 2020-07-21 ENCOUNTER — Other Ambulatory Visit: Payer: Self-pay

## 2020-07-21 ENCOUNTER — Encounter (HOSPITAL_COMMUNITY)
Admission: RE | Admit: 2020-07-21 | Discharge: 2020-07-21 | Disposition: A | Discharge status: Home / Self Care | Payer: Medicare PPO | Source: Ambulatory Visit | Attending: Emergency Medicine | Admitting: Emergency Medicine

## 2020-07-21 DIAGNOSIS — J984 Other disorders of lung: Secondary | ICD-10-CM

## 2020-07-21 NOTE — Progress Notes (Signed)
Daily Session Note  Patient Details  Name: Whitney [name] MRN: [id number] Date of Birth: 08-09-1955 Referring Provider:   April Manson Pulmonary Rehab Walk Test from 07/19/2020 in [place]  Referring Provider Dr. Lamonte Sakai      Encounter Date: 07/21/2020  Check In:  Session Check In - 07/21/20 1017      Check-In   Supervising physician immediately available to respond to emergencies [facility] immediately available    Physician(s) Dr. Florencia Reasons    Location [facility]    Staff Present Rosebud Poles, RN, Milus Glazier, MS, EP-C, CCRP;Jessica Hassell Done, MS, ACSM-CEP, Exercise Physiologist    Virtual Visit No    Medication changes reported     No    Fall or balance concerns reported    No    Tobacco Cessation No Change    Warm-up and Cool-down Performed on first and last piece of equipment    Resistance Training Performed Yes    VAD Patient? No    PAD/SET Patient? No      Pain Assessment   Currently in Pain? No/denies    Multiple Pain Sites No           Capillary Blood Glucose: No results found for this or any previous visit (from the past 24 hour(s)).    Social History   Tobacco Use  Smoking Status Never Smoker  Smokeless Tobacco Never Used    Goals Met:  Proper associated with RPD/PD & O2 Sat Exercise tolerated well Strength training completed today  Goals Unmet:  Not Applicable  Comments: Service time is from 1000 to 1116.     Dr. Fransico Him is Medical Director for Cardiac Rehab at [facility].

## 2020-07-25 ENCOUNTER — Institutional Professional Consult (permissible substitution): Payer: Medicare PPO | Admitting: Pulmonary Disease

## 2020-07-26 ENCOUNTER — Other Ambulatory Visit: Payer: Self-pay

## 2020-07-26 ENCOUNTER — Encounter (HOSPITAL_COMMUNITY)
Admission: RE | Admit: 2020-07-26 | Discharge: 2020-07-26 | Disposition: A | Discharge status: Home / Self Care | Payer: Medicare PPO | Source: Ambulatory Visit | Attending: Emergency Medicine | Admitting: Emergency Medicine

## 2020-07-26 VITALS — Wt 221.6 lb

## 2020-07-26 DIAGNOSIS — J984 Other disorders of lung: Secondary | ICD-10-CM | POA: Diagnosis not present

## 2020-07-26 NOTE — Progress Notes (Signed)
Daily Session Note  Patient Details  Name: Whitney [name] MRN: [id number] Date of Birth: [birth year] Referring Provider:   April Manson Pulmonary Rehab Walk Test from 07/19/2020 in [place]  Referring Provider Dr. Lamonte Sakai      Encounter Date: 07/26/2020  Check In:  Session Check In - 07/26/20 1016      Check-In   Supervising physician immediately available to respond to emergencies [facility] immediately available    Physician(s) Dr. Tawanna Solo    [facility]    Staff Present Rosebud Poles, RN, BSN;Lisa Ysidro Evert, RN;Jessica Hassell Done, MS, ACSM-CEP, Exercise Physiologist    Virtual Visit No    Medication changes reported     No    Fall or balance concerns reported    No    Tobacco Cessation No Change    Warm-up and Cool-down Performed on first and last piece of equipment    Resistance Training Performed Yes    VAD Patient? No    PAD/SET Patient? No      Pain Assessment   Currently in Pain? No/denies    Multiple Pain Sites No           Capillary Blood Glucose: No results found for this or any previous visit (from the past 24 hour(s)).   Exercise Prescription Changes - 07/26/20 1100      Response to Exercise   Blood Pressure (Admit) 134/74    Blood Pressure (Exercise) 150/80    Blood Pressure (Exit) 146/80    Heart Rate (Admit) 65 bpm    Heart Rate (Exercise) 77 bpm    Heart Rate (Exit) 69 bpm    Oxygen Saturation (Admit) 92 %    Oxygen Saturation (Exercise) 87 %   Had pt to do pursed lip breathing saturation increased to 90%.   Oxygen Saturation (Exit) 93 %    Rating of Perceived Exertion (Exercise) 15    Perceived Dyspnea (Exercise) 3    Duration Progress to 30 minutes of  aerobic without signs/symptoms of physical distress    Intensity THRR unchanged      Progression   Progression Continue to progress workloads to maintain intensity without signs/symptoms of physical distress.      Resistance Training    Training Prescription Yes    Weight orange bands    Reps 10-15    Time 10 Minutes      Recumbant Bike   Level 1.5    Minutes 15    METs 1.4      NuStep   Level 2    SPM 80    Minutes 15    METs 1.8           Social History   Tobacco Use  Smoking Status Never Smoker  Smokeless Tobacco Never Used    Goals Met:  No report of cardiac concerns or symptoms Strength training completed today  Goals Unmet:  Not Applicable  Comments: Service time is from 1005 to 1117    Dr. Fransico Him is Medical Director for Cardiac Rehab at [facility].

## 2020-07-27 ENCOUNTER — Encounter: Payer: Self-pay | Admitting: Pulmonary Disease

## 2020-07-27 ENCOUNTER — Ambulatory Visit (INDEPENDENT_AMBULATORY_CARE_PROVIDER_SITE_OTHER): Payer: Medicare PPO | Admitting: Pulmonary Disease

## 2020-07-27 VITALS — BP 122/80 | HR 61 | Temp 98.0°F | Ht 64.0 in | Wt 221.0 lb

## 2020-07-27 DIAGNOSIS — G4733 Obstructive sleep apnea (adult) (pediatric): Secondary | ICD-10-CM

## 2020-07-27 NOTE — Patient Instructions (Signed)
Sleep onset and sleep maintenance insomnia -Trying restrictive sleep time about 6 to 8 hours -Only being in bed for those hours -Nothing to stimulating if you wake up unable to go back to sleep immediately  Flonase or Nasonex -Nasal steroid for the congestion  Graded, regular exercises as he can tolerate and build up from there  I will see you back in about 6 weeks  Call with any significant concerns

## 2020-07-27 NOTE — Progress Notes (Addendum)
Whitney [name]    [id number]    1955/06/11  Primary Care Physician:Little, Lennette Bihari, MD  Referring Physician: Hulan Fess, MD [place],  [place] [address]  Chief complaint:   Patient with sleep onset and sleep maintenance insomnia Obstructive sleep apnea Nasal stuffiness and congestion since initiation of CPAP therapy  HPI:  History of obstructive sleep apnea in the past for which he uses CPAP, managed to lose a lot of weight and stopped using CPAP CPAP was reinitiated in 2021, following use of CPAP she has noticed nasal stuffiness and congestion, she does use nasal pillows  Follows up regularly with Dr. Lamonte Sakai  Usually tries to go to bed between 8 and 830, might take about 3 hours before falling asleep May wake up following initially falling asleep and stay up for a few hours, sometimes she plays games on the phone.  Once she falls back asleep she tries to get some hours of sleep before finally getting up in the morning Awakening time is not fixed  Has had more problems since she had bariatric surgery, she fell she sustained a paralyzed hemidiaphragm following that and her breathing has been challenging since then  She has participated in pulmonary rehab and currently does go to rehab about 2 days a week  No past history of smoking  Medical history significant for history of nonalcoholic steatohepatitis, Sjogren's, fibromyalgia, asthma prediabetic   Outpatient Encounter Medications as of 07/27/2020  Medication Sig  . albuterol (VENTOLIN HFA) 108 (90 Base) MCG/ACT inhaler Inhale 2 puffs into the lungs every 4 (four) hours as needed for wheezing or shortness of breath.  . cyanocobalamin 1000 MCG tablet Take 1,000 mcg by mouth daily.  Marland Kitchen ezetimibe (ZETIA) 10 MG tablet Take 10 mg by mouth at bedtime.   . milk thistle 175 MG tablet Take 175 mg by mouth daily.  . nadolol (CORGARD) 20 MG tablet Take 20 mg by mouth daily.  Marland Kitchen pyridOXINE (B-6) 50 MG tablet Take  by mouth.  . TURMERIC PO Take by mouth.  . [DISCONTINUED] OVER THE COUNTER MEDICATION Take 1 packet by mouth 2 (two) times daily. Bariatric Multivitamin  . amoxicillin (AMOXIL) 500 MG capsule Take 4 capsules 1 hour prior to any dental procedures (Patient not taking: Reported on 07/27/2020)  . cholecalciferol (D-VI-SOL) 10 MCG/ML LIQD Take 25 mcg by mouth daily. (Patient not taking: Reported on 07/27/2020)   No facility-administered encounter medications on file as of 07/27/2020.    Allergies as of 07/27/2020 - Review Complete 07/27/2020  Allergen Reaction Noted  . Doxycycline Hives and Rash 03/15/2011  . Naproxen Rash and Hives 03/15/2011    Past Medical History:  Diagnosis Date  . Allergic rhinitis   . Anemia    hx  . Arthritis   . Asthma    hx yrs ago  . Cirrhosis ([place]) last albumin 3.3 done at [facility] 06-16-2014 (under care everywhere tab in epic)   Secondary to Fatty liver --  followed by hepatology at [facility] (dr Gerald Dexter)   . Depression   . Diabetes mellitus type II    type 2 diet conrolled  . Dyspnea   . Fibromyalgia   . GERD (gastroesophageal reflux disease)   . Heart murmur    asymptomatic ---  1989 from rhuematic fever  . History of exercise stress test    05-05-2013---  negative bruce ETT given exercise workload,  no ischemia  . History of hiatal hernia   .  History of kidney stones   . History of rheumatic fever    1989  . Hyperlipidemia   . Leukocytopenia   . Moderate aortic stenosis    AVA area 1.1cm2---  cardiologist --  dr Concepcion Living, 2014 in epic  . NASH (nonalcoholic steatohepatitis)   . OSA (obstructive sleep apnea)    was using CPAP before gastric sleeve 2015--  no uses after wt loss  . Pneumonia    hx  . Sjogren's syndrome ([place])   . Thrombocytopenia ([place])     Past Surgical History:  Procedure Laterality Date  . AORTIC VALVE REPLACEMENT N/A 12/05/2017   Procedure: AORTIC VALVE REPLACEMENT (AVR) TISSUE VALVE 21MM INSPIRIS;  Surgeon: Gaye Pollack, MD;   Location: [place];  Service: Open Heart Surgery;  Laterality: N/A;  . COLONOSCOPY WITH ESOPHAGOGASTRODUODENOSCOPY (EGD)    . CYSTOSCOPY WITH RETROGRADE PYELOGRAM, URETEROSCOPY AND STENT PLACEMENT Left 01/21/2015   Procedure: CYSTOSCOPY WITH LEFT  RETROGRADE PYELOGRAM, LEFT URETEROSCOPY AND STENT PLACEMENT;  Surgeon: Festus Aloe, MD;  Location: [facility];  Service: Urology;  Laterality: Left;  . CYSTOSCOPY WITH RETROGRADE PYELOGRAM, URETEROSCOPY AND STENT PLACEMENT Bilateral 02/24/2015   Procedure: CYSTOSCOPY WITH RIGHT RETROGRADE PYELOGRAM, BLADDER BIOPSY FULGERATION LEFT URETEROSCOPY AND STENT REPLACEMENT;  Surgeon: Festus Aloe, MD;  Location: [facility];  Service: Urology;  Laterality: Bilateral;  . EXPLORATORY LAPAROSCOPY W/  CONE BIOPSY'S LEFT AND RIGHT LOBE OF LIVER  11-04-2007  . HOLMIUM LASER APPLICATION Left 89/21/1941   Procedure: HOLMIUM LASER LITHOTRIPSY;  Surgeon: Festus Aloe, MD;  Location: [facility];  Service: Urology;  Laterality: Left;  . HYSTEROSCOPY WITH D & C N/A 12/11/2012   Procedure: DILATATION AND CURETTAGE /HYSTEROSCOPY;  Surgeon: Maeola Sarah. Landry Mellow, MD;  Location: [facility];  Service: Gynecology;  Laterality: N/A;  . INGUINAL HERNIA REPAIR Left 10/22/2016   Procedure: LEFT INGUINAL HERNIA REPAIR;  Surgeon: Rolm Bookbinder, MD;  Location: [place];  Service: General;  Laterality: Left;  TAP BLOCK  . INSERTION OF MESH Left 10/22/2016   Procedure: INSERTION OF MESH;  Surgeon: Rolm Bookbinder, MD;  Location: [place];  Service: General;  Laterality: Left;  . LAPAROSCOPIC GASTRIC SLEEVE RESECTION  07-27-2013  . PUBOVAGINAL SLING  04-10-2001   [place]  . RIGHT/LEFT HEART CATH AND CORONARY ANGIOGRAPHY N/A 08/23/2017   Procedure: RIGHT/LEFT HEART CATH AND CORONARY ANGIOGRAPHY;  Surgeon: [name], MD;  Location: [facility];  Service: Cardiovascular;  Laterality: N/A;  . TEE WITHOUT CARDIOVERSION N/A 12/05/2017    Procedure: TRANSESOPHAGEAL ECHOCARDIOGRAM (TEE);  Surgeon: Gaye Pollack, MD;  Location: [place];  Service: Open Heart Surgery;  Laterality: N/A;  . TONSILLECTOMY  1975  . TRANSTHORACIC ECHOCARDIOGRAM  06-04-2012  dr Irish Lack   mild LVH,  grade I diastolic dysfunction/  ef 60-65%/  moderate LAE/  mild MV calcifation without stenosis,  mild MR/  moderate AV stenosis,  cannot r/o bicupsid, area 1.1cm2/  mild dilated aortic root/  trivial TR    Family History  Problem Relation Age of Onset  . Alzheimer's disease Father   . Hip fracture Father   . Asthma Father   . Heart disease Father   . Heart attack Father   . Hypertension Father   . Rheum arthritis Mother   . Heart disease Mother   . Allergies Daughter   . Asthma Paternal Grandmother   . Asthma Daughter   . Stroke Neg Hx     Social History  Socioeconomic History  . Marital status: Widowed    Spouse name: Married to [employer]  . Number of children: Not on file  . Years of education: Not on file  . Highest education level: Not on file  Occupational History  . Occupation: principal of [employer]  Tobacco Use  . Smoking status: Never Smoker  . Smokeless tobacco: Never Used  Vaping Use  . Vaping Use: Never used  Substance and Sexual Activity  . Alcohol use: Yes    Comment: rarely  . Drug use: No  . Sexual activity: Not on file  Other Topics Concern  . Not on file  Social History Narrative  . Not on file   Social Determinants of Health   Financial Resource Strain: Not on file  Food Insecurity: Not on file  Transportation Needs: Not on file  Physical Activity: Not on file  Stress: Not on file  Social Connections: Not on file  Intimate Partner Violence: Not on file    Review of Systems  Constitutional: Negative for fever.  HENT:       Nasal congestion  Respiratory: Positive for shortness of breath. Negative for chest tightness.   Cardiovascular: Negative for chest pain.  Psychiatric/Behavioral: Positive for  sleep disturbance.    Vitals:   07/27/20 1126  BP: 122/80  Pulse: 61  Temp: 98 F (36.7 C)  SpO2: 96%     Physical Exam Constitutional:      Appearance: She is obese.  HENT:     Nose: No congestion or rhinorrhea.     Mouth/Throat:     Mouth: Mucous membranes are moist.     Pharynx: No oropharyngeal exudate.  Eyes:     General:        Right eye: No discharge.        Left eye: No discharge.     Pupils: Pupils are equal, round, and reactive to light.  Cardiovascular:     Rate and Rhythm: Normal rate and regular rhythm.     Heart sounds: No murmur heard. No friction rub.  Pulmonary:     Effort: No respiratory distress.     Breath sounds: No stridor. No wheezing or rhonchi.     Comments: Decreased air entry right base Musculoskeletal:     Cervical back: No rigidity or tenderness.  Neurological:     Mental Status: She is alert.  Psychiatric:        Mood and Affect: Mood normal.    Data Reviewed: Sleep study from 09/01/2019 reviewed showing moderate obstructive sleep apnea  Assessment:  Sleep onset and sleep maintenance insomnia -Sleep behavior modification -Sleep restriction -Trying to be in bed for only number of hours that she sleeps for -Restrict time in bed to 6 to 8 hours at most -Do not do anything too active that stimulating the brain if you were to wake up in the middle of the night -May read out of bed if she chooses to-not on the phone or electronic device  Moderate obstructive sleep apnea -On CPAP therapy -Having nasal congestion and stuffiness  Rhinitis  Plan/Recommendations:  Regular exercise as tolerated -Encouraged to start 5 to 10 minutes of regular exercises and try to build up from there -Avoid frustrations of trying to do too much too soon  Flonase/Nasonex -2 sprays each nostril about an hour before bedtime  Importance of sleep restriction discussed  She is reluctant to try any new medications and she does not desire to use any sleep  aids  Importance of increasing activity discussed, needs to put this in place  I will see her back in about 6 weeks  I spent 30 minutes dedicated to the care of this patient on this encounter to include previsit review of records, face-to-face time with the patient discussing conditions above, clinical documentation with electronic health record, and communicated necessary findings to members of the patient's care team  Sherrilyn Rist MD [facility] 07/27/2020, 12:32 PM  CC: Hulan Fess, MD  Patient's compliance data reviewed showing 97% compliance with CPAP use machine set between 5 and 20 Residual AHI of 1.4 Median pressure of 6.6, 95 percentile pressure of 9 maximum pressure of 10.3

## 2020-07-28 ENCOUNTER — Other Ambulatory Visit: Payer: Self-pay

## 2020-07-28 ENCOUNTER — Encounter (HOSPITAL_COMMUNITY)
Admission: RE | Admit: 2020-07-28 | Discharge: 2020-07-28 | Disposition: A | Discharge status: Home / Self Care | Payer: Medicare PPO | Source: Ambulatory Visit | Attending: Emergency Medicine | Admitting: Emergency Medicine

## 2020-07-28 DIAGNOSIS — J984 Other disorders of lung: Secondary | ICD-10-CM | POA: Diagnosis not present

## 2020-07-28 NOTE — Progress Notes (Signed)
Daily Session Note  Patient Details  Name: Whitney [name] MRN: [id number] Date of Birth: 1955/10/09 Referring Provider:   April Manson Pulmonary Rehab Walk Test from 07/19/2020 in [place]  Referring Provider Dr. Lamonte Sakai      Encounter Date: 07/28/2020  Check In:  Session Check In - 07/28/20 1017      Check-In   Supervising physician immediately available to respond to emergencies [facility] immediately available    Physician(s) Dr. Tawanna Solo    Location [facility]    Staff Present Rosebud Poles, RN, BSN;Sheralee Qazi Ysidro Evert, RN;Jessica Hassell Done, MS, ACSM-CEP, Exercise Physiologist    Virtual Visit No    Medication changes reported     No    Fall or balance concerns reported    No    Tobacco Cessation No Change    Warm-up and Cool-down Performed on first and last piece of equipment    Resistance Training Performed No    VAD Patient? No    PAD/SET Patient? No      Pain Assessment   Currently in Pain? No/denies    Multiple Pain Sites No           Capillary Blood Glucose: No results found for this or any previous visit (from the past 24 hour(s)).    Social History   Tobacco Use  Smoking Status Never Smoker  Smokeless Tobacco Never Used    Goals Met:  Exercise tolerated well No report of cardiac concerns or symptoms Strength training completed today  Goals Unmet:  Not Applicable  Comments: Service time is from 0955 to 1055    Dr. Fransico Him is Medical Director for Cardiac Rehab at [facility].

## 2020-08-02 ENCOUNTER — Encounter (HOSPITAL_COMMUNITY)
Admission: RE | Admit: 2020-08-02 | Discharge: 2020-08-02 | Disposition: A | Discharge status: Home / Self Care | Payer: Medicare PPO | Source: Ambulatory Visit | Attending: Emergency Medicine | Admitting: Emergency Medicine

## 2020-08-02 ENCOUNTER — Other Ambulatory Visit: Payer: Self-pay

## 2020-08-02 VITALS — Wt 221.0 lb

## 2020-08-02 DIAGNOSIS — J984 Other disorders of lung: Secondary | ICD-10-CM | POA: Diagnosis not present

## 2020-08-02 NOTE — Progress Notes (Signed)
Daily Session Note  Patient Details  Name: TAUNA MACFARLANE MRN: [id number] Date of Birth: 04/30/1956 Referring Provider:   April Manson Pulmonary Rehab Walk Test from 07/19/2020 in [place]  Referring Provider Dr. Lamonte Sakai      Encounter Date: 08/02/2020  Check In:  Session Check In - 08/02/20 1022      Check-In   Supervising physician immediately available to respond to emergencies [facility] immediately available    Physician(s) Dr. Tawanna Solo    [facility]    Staff Present Rosebud Poles, RN, BSN;Mclain Freer Ysidro Evert, RN;Jessica Hassell Done, MS, ACSM-CEP, Exercise Physiologist    Virtual Visit No    Medication changes reported     No    Fall or balance concerns reported    No    Tobacco Cessation No Change    Warm-up and Cool-down Performed on first and last piece of equipment    Resistance Training Performed Yes    VAD Patient? No    PAD/SET Patient? No      Pain Assessment   Currently in Pain? No/denies    Multiple Pain Sites No           Capillary Blood Glucose: No results found for this or any previous visit (from the past 24 hour(s)).    Social History   Tobacco Use  Smoking Status Never Smoker  Smokeless Tobacco Never Used    Goals Met:  Exercise tolerated well No report of cardiac concerns or symptoms Strength training completed today  Goals Unmet:  Not Applicable  Comments: Service time is from 1000 to 1108    Dr. Fransico Him is Medical Director for Cardiac Rehab at [facility].

## 2020-08-02 NOTE — Progress Notes (Signed)
Whitney [name] 65 y.o. female Nutrition Note  Diagnosis: Restrictive lung disease  Past Medical History:  Diagnosis Date  . Allergic rhinitis   . Anemia    hx  . Arthritis   . Asthma    hx yrs ago  . Cirrhosis ([place]) last albumin 3.3 done at [facility] 06-16-2014 (under care everywhere tab in epic)   Secondary to Fatty liver --  followed by hepatology at [facility] (dr Gerald Dexter)   . Depression   . Diabetes mellitus type II    type 2 diet conrolled  . Dyspnea   . Fibromyalgia   . GERD (gastroesophageal reflux disease)   . Heart murmur    asymptomatic ---  1989 from rhuematic fever  . History of exercise stress test    05-05-2013---  negative bruce ETT given exercise workload,  no ischemia  . History of hiatal hernia   . History of kidney stones   . History of rheumatic fever    1989  . Hyperlipidemia   . Leukocytopenia   . Moderate aortic stenosis    AVA area 1.1cm2---  cardiologist --  dr Concepcion Living, 2014 in epic  . NASH (nonalcoholic steatohepatitis)   . OSA (obstructive sleep apnea)    was using CPAP before gastric sleeve 2015--  no uses after wt loss  . Pneumonia    hx  . Sjogren's syndrome ([place])   . Thrombocytopenia ([place])      Medications reviewed.   Current Outpatient Medications:  .  albuterol (VENTOLIN HFA) 108 (90 Base) MCG/ACT inhaler, Inhale 2 puffs into the lungs every 4 (four) hours as needed for wheezing or shortness of breath., Disp: 18 g, Rfl: 5 .  amoxicillin (AMOXIL) 500 MG capsule, Take 4 capsules 1 hour prior to any dental procedures (Patient not taking: Reported on 07/27/2020), Disp: 4 capsule, Rfl: 3 .  cholecalciferol (D-VI-SOL) 10 MCG/ML LIQD, Take 25 mcg by mouth daily. (Patient not taking: Reported on 07/27/2020), Disp: , Rfl:  .  cyanocobalamin 1000 MCG tablet, Take 1,000 mcg by mouth daily., Disp: , Rfl:  .  ezetimibe (ZETIA) 10 MG tablet, Take 10 mg by mouth at bedtime. , Disp: , Rfl: 11 .  milk thistle 175 MG tablet, Take 175 mg by mouth daily., Disp:  , Rfl:  .  nadolol (CORGARD) 20 MG tablet, Take 20 mg by mouth daily., Disp: , Rfl:  .  pyridOXINE (B-6) 50 MG tablet, Take by mouth., Disp: , Rfl:  .  TURMERIC PO, Take by mouth., Disp: , Rfl:    Ht Readings from Last 1 Encounters:  07/27/20 5' 4"  (1.626 m)     Wt Readings from Last 3 Encounters:  07/27/20 221 lb (100.2 kg)  07/26/20 221 lb 9 oz (100.5 kg)  07/18/20 221 lb (100.2 kg)     There is no height or weight on file to calculate BMI.   Social History   Tobacco Use  Smoking Status Never Smoker  Smokeless Tobacco Never Used      Nutrition Note  Spoke with pt. Nutrition Plan and Nutrition Survey goals reviewed with pt.  Pt has been through pulmonary rehab. She states she is still trying to lose weight. She had bariatric surgery in 2015. She has lost 150 lbs since surgery and currently 38 lbs more than her lowest weight.  Her goal weight is <200 lbs. She has not felt motivated to make changes.  She knows she feels better when she exercises.  She reports inadequate fluid  and protein.  She is trying to get 80-90 g protein and 32 oz water.   She was diagnosed with diabetes years ago. She is not taking any diabetes medications and A1c is normal.  Pt expressed understanding of the information reviewed.    Nutrition Diagnosis ? Obese  II = 35-39.9 related to excessive energy intake and sedentary lifestyle as evidenced by a BMI 37.93 kg/m2  Nutrition Intervention ? Pt's individual nutrition plan reviewed with pt. ? Benefits of adopting healthy diet reviewed with Rate My Plate survey   ? Continue client-centered nutrition education by RD, as part of interdisciplinary care.  Goal(s) ? Pt to identify food quantities necessary to achieve weight loss of 6-24 lb at graduation from pulmonary rehab.  ? Pt to build a healthy plate including vegetables, fruits, whole grains, and low-fat dairy products in a heart healthy meal plan. ? Pt to eat 80-90 g protein and >32 oz water  daily  Plan:   Will provide client-centered nutrition education as part of interdisciplinary care  Monitor and evaluate progress toward nutrition goal with team.   Michaele Offer, MS, RDN, LDN

## 2020-08-02 NOTE — Progress Notes (Signed)
Pulmonary Individual Treatment Plan  Patient Details  Name: Whitney [name] MRN: [id number] Date of Birth: July 08, 1955 Referring Provider:   April Manson Pulmonary Rehab Walk Test from 07/19/2020 in [facility]  Referring Provider Dr. Lamonte Sakai      Initial Encounter Date:  Flowsheet Row Pulmonary Rehab Walk Test from 07/19/2020 in [facility]  Date 07/19/20      Visit Diagnosis: Restrictive lung disease  Patient's Home Medications on Admission:   Current Outpatient Medications:  .  albuterol (VENTOLIN HFA) 108 (90 Base) MCG/ACT inhaler, Inhale 2 puffs into the lungs every 4 (four) hours as needed for wheezing or shortness of breath., Disp: 18 g, Rfl: 5 .  amoxicillin (AMOXIL) 500 MG capsule, Take 4 capsules 1 hour prior to any dental procedures (Patient not taking: Reported on 07/27/2020), Disp: 4 capsule, Rfl: 3 .  cholecalciferol (D-VI-SOL) 10 MCG/ML LIQD, Take 25 mcg by mouth daily. (Patient not taking: Reported on 07/27/2020), Disp: , Rfl:  .  cyanocobalamin 1000 MCG tablet, Take 1,000 mcg by mouth daily., Disp: , Rfl:  .  ezetimibe (ZETIA) 10 MG tablet, Take 10 mg by mouth at bedtime. , Disp: , Rfl: 11 .  milk thistle 175 MG tablet, Take 175 mg by mouth daily., Disp: , Rfl:  .  nadolol (CORGARD) 20 MG tablet, Take 20 mg by mouth daily., Disp: , Rfl:  .  pyridOXINE (B-6) 50 MG tablet, Take by mouth., Disp: , Rfl:  .  TURMERIC PO, Take by mouth., Disp: , Rfl:   Past Medical History: Past Medical History:  Diagnosis Date  . Allergic rhinitis   . Anemia    hx  . Arthritis   . Asthma    hx yrs ago  . Cirrhosis ([place]) last albumin 3.3 done at [facility] 06-16-2014 (under care everywhere tab in epic)   Secondary to Fatty liver --  followed by hepatology at [facility] (dr Gerald Dexter)   . Depression   . Diabetes mellitus type II    type 2 diet conrolled  . Dyspnea   . Fibromyalgia   . GERD (gastroesophageal reflux disease)   . Heart  murmur    asymptomatic ---  1989 from rhuematic fever  . History of exercise stress test    05-05-2013---  negative bruce ETT given exercise workload,  no ischemia  . History of hiatal hernia   . History of kidney stones   . History of rheumatic fever    1989  . Hyperlipidemia   . Leukocytopenia   . Moderate aortic stenosis    AVA area 1.1cm2---  cardiologist --  dr Concepcion Living, 2014 in epic  . NASH (nonalcoholic steatohepatitis)   . OSA (obstructive sleep apnea)    was using CPAP before gastric sleeve 2015--  no uses after wt loss  . Pneumonia    hx  . Sjogren's syndrome ([place])   . Thrombocytopenia ([place])     Tobacco Use: Social History   Tobacco Use  Smoking Status Never Smoker  Smokeless Tobacco Never Used    Labs: Recent Review Flowsheet Data    Labs for ITP Cardiac and Pulmonary Rehab Latest Ref Rng & Units 12/05/2017 12/05/2017 12/05/2017 12/05/2017 12/17/2018   Hemoglobin A1c 4.8 - 5.6 % - - - - -   PHART 7.350 - 7.450 7.430 7.349(L) - 7.326(L) -   PCO2ART 32.0 - 48.0 mmHg 38.9 41.7 - 43.5 -   HCO3 20.0 - 28.0 mmol/L 25.8 23.2 -  22.8 -   TCO2 22 - 32 mmol/L 27 25 23 24 27    ACIDBASEDEF 0.0 - 2.0 mmol/L - 3.0(H) - 3.0(H) -   O2SAT % 100.0 97.0 - 97.0 -      Capillary Blood Glucose: Lab Results  Component Value Date   GLUCAP 77 12/17/2018   GLUCAP 79 02/12/2018   GLUCAP 100 (H) 12/12/2017   GLUCAP 166 (H) 12/11/2017   GLUCAP 161 (H) 12/11/2017     Pulmonary Assessment Scores:  Pulmonary Assessment Scores    Row Name 07/11/20 1644 07/19/20 1628       ADL UCSD   ADL Phase Entry --    SOB Score total 78 --         CAT Score   CAT Score 36 --         mMRC Score   mMRC Score -- 4          UCSD: Self-administered rating of dyspnea associated with activities of daily living (ADLs) 6-point scale (0 = "not at all" to 5 = "maximal or unable to do because of breathlessness")  Scoring Scores range from 0 to 120.  Minimally important difference is 5  units  CAT: CAT can identify the health impairment of COPD patients and is better correlated with disease progression.  CAT has a scoring range of zero to 40. The CAT score is classified into four groups of low (less than 10), medium (10 - 20), high (21-30) and very high (31-40) based on the impact level of disease on health status. A CAT score over 10 suggests significant symptoms.  A worsening CAT score could be explained by an exacerbation, poor medication adherence, poor inhaler technique, or progression of COPD or comorbid conditions.  CAT MCID is 2 points  mMRC: mMRC (Modified Medical Research Council) Dyspnea Scale is used to assess the degree of baseline functional disability in patients of respiratory disease due to dyspnea. No minimal important difference is established. A decrease in score of 1 point or greater is considered a positive change.   Pulmonary Function Assessment:  Pulmonary Function Assessment - 07/11/20 1119      Breath   Shortness of Breath Yes;Limiting activity;Fear of Shortness of Breath           Exercise Target Goals: Exercise Program Goal: Individual exercise prescription set using results from initial 6 min walk test and THRR while considering  patient's activity barriers and safety.   Exercise Prescription Goal: Initial exercise prescription builds to 30-45 minutes a day of aerobic activity, 2-3 days per week.  Home exercise guidelines will be given to patient during program as part of exercise prescription that the participant will acknowledge.  Activity Barriers & Risk Stratification:  Activity Barriers & Cardiac Risk Stratification - 07/11/20 1111      Activity Barriers & Cardiac Risk Stratification   Activity Barriers Arthritis;Fibromyalgia;Deconditioning;Shortness of Breath;Balance Concerns    Comments sjogren syndrome           6 Minute Walk:  6 Minute Walk    Row Name 07/19/20 1201         6 Minute Walk   Phase Initial      Distance 800 feet     Walk Time 6 minutes     # of Rest Breaks 0     MPH 1.52     METS 1.67     RPE 14     Perceived Dyspnea  3     VO2 Peak 5.84  Symptoms Yes (comment)     Comments Patients oxygen saturation dropped to 84% on RA. Thought this may have been due to her cold hands. Had her stop at minute 4 to perform pursed lip breathing and check pulse ox. Oxygen saturation came back up once I warmed her fingers a little. I think the 84% was an error. We will continue to monitor oxygen saturation and supplemental oxygen needs.     Resting HR 61 bpm     Resting BP 148/80     Resting Oxygen Saturation  95 %     Exercise Oxygen Saturation  during 6 min walk 84 %     Max Ex. HR 85 bpm     Max Ex. BP 160/84     2 Minute Post BP 146/80           Interval HR   1 Minute HR 74     2 Minute HR 82     3 Minute HR 85     4 Minute HR 68  This was when I had her stop to perform pursed lip breathing     5 Minute HR 75     6 Minute HR 65     Interval Heart Rate? Yes           Interval Oxygen   Interval Oxygen? Yes     Baseline Oxygen Saturation % 95 %     1 Minute Oxygen Saturation % 94 %     1 Minute Liters of Oxygen 0 L     2 Minute Oxygen Saturation % 87 %     2 Minute Liters of Oxygen 0 L     3 Minute Oxygen Saturation % 85 %     3 Minute Liters of Oxygen 0 L     4 Minute Oxygen Saturation % 84 %  Stopped and performed pursed lip breathing     4 Minute Liters of Oxygen 0 L     5 Minute Oxygen Saturation % 87 %     5 Minute Liters of Oxygen 0 L     6 Minute Oxygen Saturation % 92 %     6 Minute Liters of Oxygen 0 L     2 Minute Post Oxygen Saturation % 95 %     2 Minute Post Liters of Oxygen 0 L            Oxygen Initial Assessment:  Oxygen Initial Assessment - 07/19/20 1210      Home Oxygen   Home Oxygen Device None    Sleep Oxygen Prescription CPAP    Home Exercise Oxygen Prescription None    Home Resting Oxygen Prescription None    Compliance with Home Oxygen Use  Yes      Initial 6 min Walk   Oxygen Used None      Program Oxygen Prescription   Program Oxygen Prescription None   May possibly need supplemental oxygen with walking     Intervention   Short Term Goals To learn and exhibit compliance with exercise, home and travel O2 prescription;To learn and understand importance of monitoring SPO2 with pulse oximeter and demonstrate accurate use of the pulse oximeter.;To learn and understand importance of maintaining oxygen saturations>88%;To learn and demonstrate proper pursed lip breathing techniques or other breathing techniques.;To learn and demonstrate proper use of respiratory medications    Long  Term Goals Exhibits compliance with exercise, home and travel O2 prescription;Verbalizes importance of monitoring SPO2 with pulse oximeter  and return demonstration;Maintenance of O2 saturations>88%;Exhibits proper breathing techniques, such as pursed lip breathing or other method taught during program session;Compliance with respiratory medication;Demonstrates proper use of MDI's           Oxygen Re-Evaluation:  Oxygen Re-Evaluation    Row Name 08/02/20 0745             Program Oxygen Prescription   Program Oxygen Prescription None               Home Oxygen   Home Oxygen Device None       Sleep Oxygen Prescription CPAP       Home Exercise Oxygen Prescription None       Home Resting Oxygen Prescription None       Compliance with Home Oxygen Use Yes               Goals/Expected Outcomes   Short Term Goals To learn and exhibit compliance with exercise, home and travel O2 prescription;To learn and understand importance of monitoring SPO2 with pulse oximeter and demonstrate accurate use of the pulse oximeter.;To learn and understand importance of maintaining oxygen saturations>88%;To learn and demonstrate proper pursed lip breathing techniques or other breathing techniques.;To learn and demonstrate proper use of respiratory medications        Long  Term Goals Exhibits compliance with exercise, home and travel O2 prescription;Verbalizes importance of monitoring SPO2 with pulse oximeter and return demonstration;Maintenance of O2 saturations>88%;Exhibits proper breathing techniques, such as pursed lip breathing or other method taught during program session;Compliance with respiratory medication;Demonstrates proper use of MDI's       Goals/Expected Outcomes Compliance and understanding of oxygen saturation and pursed lip breathing              Oxygen Discharge (Final Oxygen Re-Evaluation):  Oxygen Re-Evaluation - 08/02/20 0745      Program Oxygen Prescription   Program Oxygen Prescription None      Home Oxygen   Home Oxygen Device None    Sleep Oxygen Prescription CPAP    Home Exercise Oxygen Prescription None    Home Resting Oxygen Prescription None    Compliance with Home Oxygen Use Yes      Goals/Expected Outcomes   Short Term Goals To learn and exhibit compliance with exercise, home and travel O2 prescription;To learn and understand importance of monitoring SPO2 with pulse oximeter and demonstrate accurate use of the pulse oximeter.;To learn and understand importance of maintaining oxygen saturations>88%;To learn and demonstrate proper pursed lip breathing techniques or other breathing techniques.;To learn and demonstrate proper use of respiratory medications    Long  Term Goals Exhibits compliance with exercise, home and travel O2 prescription;Verbalizes importance of monitoring SPO2 with pulse oximeter and return demonstration;Maintenance of O2 saturations>88%;Exhibits proper breathing techniques, such as pursed lip breathing or other method taught during program session;Compliance with respiratory medication;Demonstrates proper use of MDI's    Goals/Expected Outcomes Compliance and understanding of oxygen saturation and pursed lip breathing           Initial Exercise Prescription:  Initial Exercise Prescription -  07/19/20 1200      Date of Initial Exercise RX and Referring Provider   Date 07/19/20    Referring Provider Dr. Lamonte Sakai    Expected Discharge Date 09/20/20      NuStep   Level 2    SPM 80    Minutes 15      Track   Minutes 15      Prescription Details   Frequency (  times per week) 2    Duration Progress to 30 minutes of continuous aerobic without signs/symptoms of physical distress      Intensity   THRR 40-80% of Max Heartrate 62-123    Ratings of Perceived Exertion 11-13    Perceived Dyspnea 0-4      Progression   Progression Continue to progress workloads to maintain intensity without signs/symptoms of physical distress.      Resistance Training   Training Prescription Yes    Weight orange bands    Reps 10-15           Perform Capillary Blood Glucose checks as needed.  Exercise Prescription Changes:  Exercise Prescription Changes    Row Name 07/26/20 1100             Response to Exercise   Blood Pressure (Admit) 134/74       Blood Pressure (Exercise) 150/80       Blood Pressure (Exit) 146/80       Heart Rate (Admit) 65 bpm       Heart Rate (Exercise) 77 bpm       Heart Rate (Exit) 69 bpm       Oxygen Saturation (Admit) 92 %       Oxygen Saturation (Exercise) 87 %  Had pt to do pursed lip breathing saturation increased to 90%.       Oxygen Saturation (Exit) 93 %       Rating of Perceived Exertion (Exercise) 15       Perceived Dyspnea (Exercise) 3       Duration Progress to 30 minutes of  aerobic without signs/symptoms of physical distress       Intensity THRR unchanged               Progression   Progression Continue to progress workloads to maintain intensity without signs/symptoms of physical distress.               Resistance Training   Training Prescription Yes       Weight orange bands       Reps 10-15       Time 10 Minutes               Recumbant Bike   Level 1.5       Minutes 15       METs 1.4               NuStep   Level 2        SPM 80       Minutes 15       METs 1.8              Exercise Comments:  Exercise Comments    Row Name 07/21/20 1157           Exercise Comments Patient completed first day of exercise and tolerated well for the most part. She was able to do 15 minutes on the Nustep with no issues. I had her walking the track for 15 minutes but 3 minutes into it she stated that she was not going to walk the track any longer because she felt light headed and dizzy. Vital signs were fine. She also complained of hip pain. Pt agreed to get back on the Nustep for 10 more minutes. She tolerated resistance bands and stretches well with no complaints. Will continue to monitor and adjust exercise prescription as needed.  Exercise Goals and Review:  Exercise Goals    Row Name 07/11/20 1612             Exercise Goals   Increase Physical Activity Yes       Intervention Provide advice, education, support and counseling about physical activity/exercise needs.;Develop an individualized exercise prescription for aerobic and resistive training based on initial evaluation findings, risk stratification, comorbidities and participant's personal goals.       Expected Outcomes Short Term: Attend rehab on a regular basis to increase amount of physical activity.;Long Term: Add in home exercise to make exercise part of routine and to increase amount of physical activity.;Long Term: Exercising regularly at least 3-5 days a week.       Increase Strength and Stamina Yes       Intervention Provide advice, education, support and counseling about physical activity/exercise needs.;Develop an individualized exercise prescription for aerobic and resistive training based on initial evaluation findings, risk stratification, comorbidities and participant's personal goals.       Expected Outcomes Short Term: Increase workloads from initial exercise prescription for resistance, speed, and METs.;Short Term: Perform resistance  training exercises routinely during rehab and add in resistance training at home;Long Term: Improve cardiorespiratory fitness, muscular endurance and strength as measured by increased METs and functional capacity (6MWT)       Able to understand and use rate of perceived exertion (RPE) scale Yes       Intervention Provide education and explanation on how to use RPE scale       Expected Outcomes Short Term: Able to use RPE daily in rehab to express subjective intensity level;Long Term:  Able to use RPE to guide intensity level when exercising independently       Able to understand and use Dyspnea scale Yes       Intervention Provide education and explanation on how to use Dyspnea scale       Expected Outcomes Short Term: Able to use Dyspnea scale daily in rehab to express subjective sense of shortness of breath during exertion;Long Term: Able to use Dyspnea scale to guide intensity level when exercising independently       Knowledge and understanding of Target Heart Rate Range (THRR) Yes       Intervention Provide education and explanation of THRR including how the numbers were predicted and where they are located for reference       Expected Outcomes Short Term: Able to state/look up THRR;Long Term: Able to use THRR to govern intensity when exercising independently;Short Term: Able to use daily as guideline for intensity in rehab       Understanding of Exercise Prescription Yes       Intervention Provide education, explanation, and written materials on patient's individual exercise prescription       Expected Outcomes Short Term: Able to explain program exercise prescription;Long Term: Able to explain home exercise prescription to exercise independently              Exercise Goals Re-Evaluation :  Exercise Goals Re-Evaluation    Row Name 08/02/20 0742             Exercise Goal Re-Evaluation   Exercise Goals Review Increase Physical Activity;Increase Strength and Stamina;Able to understand  and use rate of perceived exertion (RPE) scale;Able to understand and use Dyspnea scale;Knowledge and understanding of Target Heart Rate Range (THRR);Understanding of Exercise Prescription       Comments Pt has completed 3 exercise sessions. It is too early to  see much progression. The first session we had her on the Nustep for 15 minutes and walking the track for 15 minutes. Pt stated that she was not going to walk the track because she did not feel comfortable doing so. Her new exercise prescription is 15 minutes on the Nustep and 15 minutes on the Recumbent bike. So far she has tolerated this well. She is exercising at 1.9 METS on the Nustep and 1.5 METS on the Bike. Will continue to monitor and progress as she is able.       Expected Outcomes Through exercise at rehab and at home, the patient will decrease shortness of breath with daily activities and feel confident in carrying out an exercise regime at home.              Discharge Exercise Prescription (Final Exercise Prescription Changes):  Exercise Prescription Changes - 07/26/20 1100      Response to Exercise   Blood Pressure (Admit) 134/74    Blood Pressure (Exercise) 150/80    Blood Pressure (Exit) 146/80    Heart Rate (Admit) 65 bpm    Heart Rate (Exercise) 77 bpm    Heart Rate (Exit) 69 bpm    Oxygen Saturation (Admit) 92 %    Oxygen Saturation (Exercise) 87 %   Had pt to do pursed lip breathing saturation increased to 90%.   Oxygen Saturation (Exit) 93 %    Rating of Perceived Exertion (Exercise) 15    Perceived Dyspnea (Exercise) 3    Duration Progress to 30 minutes of  aerobic without signs/symptoms of physical distress    Intensity THRR unchanged      Progression   Progression Continue to progress workloads to maintain intensity without signs/symptoms of physical distress.      Resistance Training   Training Prescription Yes    Weight orange bands    Reps 10-15    Time 10 Minutes      Recumbant Bike   Level 1.5     Minutes 15    METs 1.4      NuStep   Level 2    SPM 80    Minutes 15    METs 1.8           Nutrition:  Target Goals: Understanding of nutrition guidelines, daily intake of sodium <1554m, cholesterol <2039m calories 30% from fat and 7% or less from saturated fats, daily to have 5 or more servings of fruits and vegetables.  Biometrics:  Pre Biometrics - 07/19/20 1201      Pre Biometrics   Grip Strength 23 kg            Nutrition Therapy Plan and Nutrition Goals:   Nutrition Assessments:  MEDIFICTS Score Key:  ?70 Need to make dietary changes   40-70 Heart Healthy Diet  ? 40 Therapeutic Level Cholesterol Diet   Picture Your Plate Scores:  <4<41nhealthy dietary pattern with much room for improvement.  41-50 Dietary pattern unlikely to meet recommendations for good health and room for improvement.  51-60 More healthful dietary pattern, with some room for improvement.   >60 Healthy dietary pattern, although there may be some specific behaviors that could be improved.    Nutrition Goals Re-Evaluation:  Nutrition Goals Re-Evaluation    [place] 07/27/20 1457             Goals   Current Weight 221 lb 5.5 oz (100.4 kg)  Nutrition Goals Discharge (Final Nutrition Goals Re-Evaluation):  Nutrition Goals Re-Evaluation - 07/27/20 1457      Goals   Current Weight 221 lb 5.5 oz (100.4 kg)           Psychosocial: Target Goals: Acknowledge presence or absence of significant depression and/or stress, maximize coping skills, provide positive support system. Participant is able to verbalize types and ability to use techniques and skills needed for reducing stress and depression.  Initial Review & Psychosocial Screening:  Initial Psych Review & Screening - 07/11/20 1120      Initial Review   Current issues with None Identified;Current Stress Concerns    Source of Stress Concerns Chronic Illness      Family Dynamics   Good Support System?  Yes   Son and Daughter and grandchildren     Barriers   Psychosocial barriers to participate in program The patient should benefit from training in stress management and relaxation.      Screening Interventions   Interventions Encouraged to exercise           Quality of Life Scores:  Scores of 19 and below usually indicate a poorer quality of life in these areas.  A difference of  2-3 points is a clinically meaningful difference.  A difference of 2-3 points in the total score of the Quality of Life Index has been associated with significant improvement in overall quality of life, self-image, physical symptoms, and general health in studies assessing change in quality of life.  PHQ-9: Recent Review Flowsheet Data    Depression screen [facility] 2/9 07/11/2020 03/11/2019 03/11/2019 04/28/2018 02/12/2018   Decreased Interest 1 1 1  0 0   Down, Depressed, Hopeless 1 1 1  0 0   PHQ - 2 Score 2 2 2  0 0   Altered sleeping 1 1 1  - -   Tired, decreased energy 2 1 1  - -   Change in appetite 1 0 0 - -   Feeling bad or failure about yourself  0 0 0 - -   Trouble concentrating 0 0 0 - -   Moving slowly or fidgety/restless 0 0 0 - -   Suicidal thoughts 0 0 0 - -   PHQ-9 Score 6 4 4  - -   Difficult doing work/chores Somewhat difficult Not difficult at all - - -     Interpretation of Total Score  Total Score Depression Severity:  1-4 = Minimal depression, 5-9 = Mild depression, 10-14 = Moderate depression, 15-19 = Moderately severe depression, 20-27 = Severe depression   Psychosocial Evaluation and Intervention:  Psychosocial Evaluation - 07/11/20 1613      Psychosocial Evaluation & Interventions   Interventions Encouraged to exercise with the program and follow exercise prescription    Comments Whitney [name] reports that she does feel depressed some days due to her illness and inability to do things she once could. She refuses treatement but has a positive outlook.    Expected Outcomes Improve mental well being  and decrease depression symptoms    Continue Psychosocial Services  Follow up required by staff           Psychosocial Re-Evaluation:  Psychosocial Re-Evaluation    [place] Name 08/01/20 1119             Psychosocial Re-Evaluation   Current issues with Current Stress Concerns       Comments Whitney [name] is frustrated with her decline in shortness of breath with any activity.  Expected Outcomes For Whitney [name] to handle her stress in healthy ways.       Interventions Encouraged to attend Pulmonary Rehabilitation for the exercise;Relaxation education;Stress management education               Initial Review   Source of Stress Concerns Chronic Illness              Psychosocial Discharge (Final Psychosocial Re-Evaluation):  Psychosocial Re-Evaluation - 08/01/20 1119      Psychosocial Re-Evaluation   Current issues with Current Stress Concerns    Comments Whitney [name] is frustrated with her decline in shortness of breath with any activity.    Expected Outcomes For Whitney [name] to handle her stress in healthy ways.    Interventions Encouraged to attend Pulmonary Rehabilitation for the exercise;Relaxation education;Stress management education      Initial Review   Source of Stress Concerns Chronic Illness           Education: Education Goals: Education classes will be provided on a weekly basis, covering required topics. Participant will state understanding/return demonstration of topics presented.  Learning Barriers/Preferences:  Learning Barriers/Preferences - 07/11/20 1124      Learning Barriers/Preferences   Learning Barriers None    Learning Preferences Skilled Demonstration;Individual Instruction;Written Material           Education Topics: Risk Factor Reduction:  -Group instruction that is supported by a PowerPoint presentation. Instructor discusses the definition of a risk factor, different risk factors for pulmonary disease, and how the heart and lungs work together.     Nutrition for  Pulmonary Patient:  -Group instruction provided by PowerPoint slides, verbal discussion, and written materials to support subject matter. The instructor gives an explanation and review of healthy diet recommendations, which includes a discussion on weight management, recommendations for fruit and vegetable consumption, as well as protein, fluid, caffeine, fiber, sodium, sugar, and alcohol. Tips for eating when patients are short of breath are discussed.   Pursed Lip Breathing:  -Group instruction that is supported by demonstration and informational handouts. Instructor discusses the benefits of pursed lip and diaphragmatic breathing and detailed demonstration on how to preform both.     Oxygen Safety:  -Group instruction provided by PowerPoint, verbal discussion, and written material to support subject matter. There is an overview of "What is Oxygen" and "Why do we need it".  Instructor also reviews how to create a safe environment for oxygen use, the importance of using oxygen as prescribed, and the risks of noncompliance. There is a brief discussion on traveling with oxygen and resources the patient may utilize.   Oxygen Equipment:  -Group instruction provided by [facility] Staff utilizing handouts, written materials, and equipment demonstrations.   Signs and Symptoms:  -Group instruction provided by written material and verbal discussion to support subject matter. Warning signs and symptoms of infection, stroke, and heart attack are reviewed and when to call the physician/911 reinforced. Tips for preventing the spread of infection discussed.   Advanced Directives:  -Group instruction provided by verbal instruction and written material to support subject matter. Instructor reviews Advanced Directive laws and proper instruction for filling out document.   Pulmonary Video:  -Group video education that reviews the importance of medication and oxygen compliance, exercise, good nutrition,  pulmonary hygiene, and pursed lip and diaphragmatic breathing for the pulmonary patient.   Exercise for the Pulmonary Patient:  -Group instruction that is supported by a PowerPoint presentation. Instructor discusses benefits of exercise, core components of exercise, frequency, duration, and  intensity of an exercise routine, importance of utilizing pulse oximetry during exercise, safety while exercising, and options of places to exercise outside of rehab.     Pulmonary Medications:  -Verbally interactive group education provided by instructor with focus on inhaled medications and proper administration. Flowsheet Row PULMONARY REHAB OTHER RESPIRATORY from 04/23/2019 in [place]  Date 04/23/19  Educator --  [Handout]      Anatomy and Physiology of the Respiratory System and Intimacy:  -Group instruction provided by PowerPoint, verbal discussion, and written material to support subject matter. Instructor reviews respiratory cycle and anatomical components of the respiratory system and their functions. Instructor also reviews differences in obstructive and restrictive respiratory diseases with examples of each. Intimacy, Sex, and Sexuality differences are reviewed with a discussion on how relationships can change when diagnosed with pulmonary disease. Common sexual concerns are reviewed. Flowsheet Row PULMONARY REHAB OTHER RESPIRATORY from 04/23/2019 in [place]  Date 04/07/19  Educator --  [Handout]      MD DAY -A group question and answer session with a medical doctor that allows participants to ask questions that relate to their pulmonary disease state.   OTHER EDUCATION -Group or individual verbal, written, or video instructions that support the educational goals of the pulmonary rehab program.   Holiday Eating Survival Tips:  -Group instruction provided by PowerPoint slides, verbal discussion, and written materials to  support subject matter. The instructor gives patients tips, tricks, and techniques to help them not only survive but enjoy the holidays despite the onslaught of food that accompanies the holidays. Flowsheet Row PULMONARY REHAB OTHER RESPIRATORY from 04/23/2019 in [place]  Date 03/24/19  Educator 03/31/19  [Handout]      Knowledge Questionnaire Score:  Knowledge Questionnaire Score - 07/11/20 1644      Knowledge Questionnaire Score   Pre Score 17/18           Core Components/Risk Factors/Patient Goals at Admission:  Personal Goals and Risk Factors at Admission - 07/11/20 1615      Core Components/Risk Factors/Patient Goals on Admission    Weight Management Weight Loss    Intervention Weight Management: Develop a combined nutrition and exercise program designed to reach desired caloric intake, while maintaining appropriate intake of nutrient and fiber, sodium and fats, and appropriate energy expenditure required for the weight goal.;Obesity: Provide education and appropriate resources to help participant work on and attain dietary goals.;Weight Management/Obesity: Establish reasonable short term and long term weight goals.    Admit Weight 221 lb (100.2 kg)    Goal Weight: Short Term 200 lb (90.7 kg)    Goal Weight: Long Term 180 lb (81.6 kg)    Expected Outcomes Short Term: Continue to assess and modify interventions until short term weight is achieved;Long Term: Adherence to nutrition and physical activity/exercise program aimed toward attainment of established weight goal;Weight Loss: Understanding of general recommendations for a balanced deficit meal plan, which promotes 1-2 lb weight loss per week and includes a negative energy balance of [phone] kcal/d    Improve shortness of breath with ADL's Yes    Intervention Provide education, individualized exercise plan and daily activity instruction to help decrease symptoms of SOB with activities of daily  living.    Expected Outcomes Short Term: Improve cardiorespiratory fitness to achieve a reduction of symptoms when performing ADLs;Long Term: Be able to perform more ADLs without symptoms or delay the onset of symptoms  Diabetes --   Pre-diabetes   Hypertension Yes    Intervention Provide education on lifestyle modifcations including regular physical activity/exercise, weight management, moderate sodium restriction and increased consumption of fresh fruit, vegetables, and low fat dairy, alcohol moderation, and smoking cessation.;Monitor prescription use compliance.    Expected Outcomes Long Term: Maintenance of blood pressure at goal levels.           Core Components/Risk Factors/Patient Goals Review:   Goals and Risk Factor Review    Row Name 08/01/20 1125             Core Components/Risk Factors/Patient Goals Review   Personal Goals Review Develop more efficient breathing techniques such as purse lipped breathing and diaphragmatic breathing and practicing self-pacing with activity.;Increase knowledge of respiratory medications and ability to use respiratory devices properly.;Improve shortness of breath with ADL's       Review Whitney [name] just started the program, has exercised 3 times, too early to see progression towards goals of improving shortness of breath.       Expected Outcomes See admission goals.              Core Components/Risk Factors/Patient Goals at Discharge (Final Review):   Goals and Risk Factor Review - 08/01/20 1125      Core Components/Risk Factors/Patient Goals Review   Personal Goals Review Develop more efficient breathing techniques such as purse lipped breathing and diaphragmatic breathing and practicing self-pacing with activity.;Increase knowledge of respiratory medications and ability to use respiratory devices properly.;Improve shortness of breath with ADL's    Review Whitney [name] just started the program, has exercised 3 times, too early to see progression towards  goals of improving shortness of breath.    Expected Outcomes See admission goals.           ITP Comments:   Comments: ITP REVIEW Pt is making expected progress toward pulmonary rehab goals after completing 3 sessions. Recommend continued exercise, life style modification, education, and utilization of breathing techniques to increase stamina and strength and decrease shortness of breath with exertion.

## 2020-08-04 ENCOUNTER — Encounter (HOSPITAL_COMMUNITY)
Admission: RE | Admit: 2020-08-04 | Discharge: 2020-08-04 | Disposition: A | Discharge status: Home / Self Care | Payer: Medicare PPO | Source: Ambulatory Visit | Attending: Emergency Medicine | Admitting: Emergency Medicine

## 2020-08-04 ENCOUNTER — Other Ambulatory Visit: Payer: Self-pay

## 2020-08-04 DIAGNOSIS — J984 Other disorders of lung: Secondary | ICD-10-CM | POA: Diagnosis not present

## 2020-08-04 NOTE — Progress Notes (Signed)
Daily Session Note  Patient Details  Name: Whitney [name] MRN: [id number] Date of Birth: 02/27/1956 Referring Provider:   Flowsheet Row Pulmonary Rehab Walk Test from 07/19/2020 in [facility]  Referring Provider Dr. Byrum      Encounter Date: 08/04/2020  Check In:  Session Check In - 08/04/20 1050      Check-In   Supervising physician immediately available to respond to emergencies [facility] immediately available    Physician(s) Dr. Fang Xu    Location [facility]    Staff Present Joan Behrens, RN, BSN;Lisa Hughes, RN;Jessica Martin, MS, ACSM-CEP, Exercise Physiologist    Virtual Visit No    Medication changes reported     No    Fall or balance concerns reported    No    Tobacco Cessation No Change    Warm-up and Cool-down Performed on first and last piece of equipment    Resistance Training Performed Yes    VAD Patient? No    PAD/SET Patient? No      Pain Assessment   Currently in Pain? No/denies    Multiple Pain Sites No           Capillary Blood Glucose: No results found for this or any previous visit (from the past 24 hour(s)).    Social History   Tobacco Use  Smoking Status Never Smoker  Smokeless Tobacco Never Used    Goals Met:  Proper associated with RPD/PD & O2 Sat Exercise tolerated well No report of cardiac concerns or symptoms Strength training completed today  Goals Unmet:  Not Applicable  Comments: Service time is from 0955 to 1105    Dr. Traci Turner is Medical Director for Cardiac Rehab at [facility]. 

## 2020-08-09 ENCOUNTER — Other Ambulatory Visit: Payer: Self-pay

## 2020-08-09 ENCOUNTER — Encounter (HOSPITAL_COMMUNITY)
Admission: RE | Admit: 2020-08-09 | Discharge: 2020-08-09 | Disposition: A | Discharge status: Home / Self Care | Payer: Medicare PPO | Source: Ambulatory Visit | Attending: Emergency Medicine | Admitting: Emergency Medicine

## 2020-08-09 ENCOUNTER — Other Ambulatory Visit: Payer: Self-pay | Admitting: Emergency Medicine

## 2020-08-09 VITALS — Wt 222.0 lb

## 2020-08-09 DIAGNOSIS — J984 Other disorders of lung: Secondary | ICD-10-CM | POA: Diagnosis present

## 2020-08-09 NOTE — Progress Notes (Signed)
Daily Session Note  Patient Details  Name: Whitney [name] MRN: [id number] Date of Birth: December 26, 1955 Referring Provider:   April Manson Pulmonary Rehab Walk Test from 07/19/2020 in [place]  Referring Provider Dr. Lamonte Sakai      Encounter Date: 08/09/2020  Check In:  Session Check In - 08/09/20 1116      Check-In   Supervising physician immediately available to respond to emergencies [facility] immediately available    Physician(s) Dr. Tawanna Solo    [facility]    Staff Present Maurice Small, RN, BSN;Lisa Ysidro Evert, RN;Rita Prom Hassell Done, MS, ACSM-CEP, Exercise Physiologist    Virtual Visit No    Medication changes reported     No    Fall or balance concerns reported    No    Tobacco Cessation No Change    Warm-up and Cool-down Performed on first and last piece of equipment    Resistance Training Performed Yes    VAD Patient? No    PAD/SET Patient? No      Pain Assessment   Currently in Pain? No/denies    Pain Score 0-No pain    Multiple Pain Sites No           Capillary Blood Glucose: No results found for this or any previous visit (from the past 24 hour(s)).   Exercise Prescription Changes - 08/09/20 1200      Response to Exercise   Blood Pressure (Admit) 138/82    Blood Pressure (Exercise) 140/84    Blood Pressure (Exit) 136/72    Heart Rate (Admit) 62 bpm    Heart Rate (Exercise) 64 bpm    Heart Rate (Exit) 63 bpm    Oxygen Saturation (Admit) 94 %    Oxygen Saturation (Exercise) 91 %    Oxygen Saturation (Exit) 95 %    Rating of Perceived Exertion (Exercise) 15    Perceived Dyspnea (Exercise) 3    Duration Continue with 30 min of aerobic exercise without signs/symptoms of physical distress.    Intensity THRR unchanged      Progression   Progression Continue to progress workloads to maintain intensity without signs/symptoms of physical distress.      Resistance Training   Training Prescription Yes     Weight orange bands    Reps 10-15    Time 10 Minutes      Recumbant Bike   Level 1.5    Minutes 15    METs 1.4      NuStep   Level 3    SPM 80    Minutes 15    METs 1.7           Social History   Tobacco Use  Smoking Status Never Smoker  Smokeless Tobacco Never Used    Goals Met:  Proper associated with RPD/PD & O2 Sat Independence with exercise equipment Exercise tolerated well Strength training completed today  Goals Unmet:  Not Applicable  Comments: Service time is from 1010 to 1110    Dr. Fransico Him is Medical Director for Cardiac Rehab at [facility].

## 2020-08-10 ENCOUNTER — Telehealth (HOSPITAL_COMMUNITY): Payer: Self-pay | Admitting: Family Medicine

## 2020-08-11 ENCOUNTER — Encounter (HOSPITAL_COMMUNITY): Payer: Medicare PPO

## 2020-08-16 ENCOUNTER — Other Ambulatory Visit: Payer: Self-pay

## 2020-08-16 ENCOUNTER — Encounter (HOSPITAL_COMMUNITY)
Admission: RE | Admit: 2020-08-16 | Discharge: 2020-08-16 | Disposition: A | Discharge status: Home / Self Care | Payer: Medicare PPO | Source: Ambulatory Visit | Attending: Emergency Medicine | Admitting: Emergency Medicine

## 2020-08-16 DIAGNOSIS — J984 Other disorders of lung: Secondary | ICD-10-CM

## 2020-08-16 NOTE — Progress Notes (Signed)
Daily Session Note  Patient Details  Name: Whitney [name] MRN: [id number] Date of Birth: 1955-09-15 Referring Provider:   April Manson Pulmonary Rehab Walk Test from 07/19/2020 in [place]  Referring Provider Dr. Lamonte Sakai      Encounter Date: 08/16/2020  Check In:  Session Check In - 08/16/20 1018      Check-In   Supervising physician immediately available to respond to emergencies [facility] immediately available    Physician(s) Dr. Tawanna Solo    [facility]    Staff Present Rosebud Poles, RN, Isaac Laud, MS, ACSM-CEP, Exercise Physiologist    Virtual Visit No    Medication changes reported     No    Fall or balance concerns reported    No    Tobacco Cessation No Change    Warm-up and Cool-down Performed on first and last piece of equipment    Resistance Training Performed Yes    VAD Patient? No    PAD/SET Patient? No      Pain Assessment   Currently in Pain? No/denies    Multiple Pain Sites No           Capillary Blood Glucose: No results found for this or any previous visit (from the past 24 hour(s)).    Social History   Tobacco Use  Smoking Status Never Smoker  Smokeless Tobacco Never Used    Goals Met:  Proper associated with RPD/PD & O2 Sat Independence with exercise equipment Exercise tolerated well Strength training completed today  Goals Unmet:  Not Applicable  Comments: Service time is from 1005 to 1112    Dr. Fransico Him is Medical Director for Cardiac Rehab at [facility].

## 2020-08-18 ENCOUNTER — Other Ambulatory Visit: Payer: Self-pay

## 2020-08-18 ENCOUNTER — Encounter (HOSPITAL_COMMUNITY)
Admission: RE | Admit: 2020-08-18 | Discharge: 2020-08-18 | Disposition: A | Discharge status: Home / Self Care | Payer: Medicare PPO | Source: Ambulatory Visit | Attending: Emergency Medicine | Admitting: Emergency Medicine

## 2020-08-18 DIAGNOSIS — J984 Other disorders of lung: Secondary | ICD-10-CM

## 2020-08-18 NOTE — Progress Notes (Signed)
Daily Session Note  Patient Details  Name: ABYGALE KARPF MRN: [id number] Date of Birth: 03-14-1956 Referring Provider:   April Manson Pulmonary Rehab Walk Test from 07/19/2020 in [place]  Referring Provider Dr. Lamonte Sakai      Encounter Date: 08/18/2020  Check In:  Session Check In - 08/18/20 1016      Check-In   Supervising physician immediately available to respond to emergencies [facility] immediately available    Physician(s) Dr. Florencia Reasons    [facility]    Staff Present Rosebud Poles, RN, BSN;Wonder Donaway Ysidro Evert, RN;Jessica Hassell Done, MS, ACSM-CEP, Exercise Physiologist    Virtual Visit No    Medication changes reported     No    Fall or balance concerns reported    No    Tobacco Cessation No Change    Warm-up and Cool-down Performed on first and last piece of equipment    Resistance Training Performed Yes    VAD Patient? No    PAD/SET Patient? No      Pain Assessment   Currently in Pain? No/denies    Multiple Pain Sites No           Capillary Blood Glucose: No results found for this or any previous visit (from the past 24 hour(s)).    Social History   Tobacco Use  Smoking Status Never Smoker  Smokeless Tobacco Never Used    Goals Met:  Exercise tolerated well No report of cardiac concerns or symptoms Strength training completed today  Goals Unmet:  Not Applicable  Comments: Service time is from 1007 to 1055    Dr. Fransico Him is Medical Director for Cardiac Rehab at [facility].

## 2020-08-23 ENCOUNTER — Encounter (HOSPITAL_COMMUNITY)
Admission: RE | Admit: 2020-08-23 | Discharge: 2020-08-23 | Disposition: A | Discharge status: Home / Self Care | Payer: Medicare PPO | Source: Ambulatory Visit | Attending: Emergency Medicine | Admitting: Emergency Medicine

## 2020-08-23 ENCOUNTER — Other Ambulatory Visit: Payer: Self-pay

## 2020-08-23 VITALS — Wt 223.3 lb

## 2020-08-23 DIAGNOSIS — J984 Other disorders of lung: Secondary | ICD-10-CM | POA: Diagnosis not present

## 2020-08-23 NOTE — Progress Notes (Signed)
Daily Session Note  Patient Details  Name: Whitney [name] MRN: [id number] Date of Birth: Oct 07, 1955 Referring Provider:   April Manson Pulmonary Rehab Walk Test from 07/19/2020 in [place]  Referring Provider Dr. Lamonte Sakai      Encounter Date: 08/23/2020  Check In:  Session Check In - 08/23/20 1102      Check-In   Supervising physician immediately available to respond to emergencies [facility] immediately available    Physician(s) Dr. Tawanna Solo    [facility]    Staff Present Rosebud Poles, RN, BSN;Lisbeth Puller Ysidro Evert, RN;Jessica Hassell Done, MS, ACSM-CEP, Exercise Physiologist    Virtual Visit No    Medication changes reported     No    Fall or balance concerns reported    No    Tobacco Cessation No Change    Warm-up and Cool-down Performed on first and last piece of equipment    Resistance Training Performed Yes    VAD Patient? No    PAD/SET Patient? No      Pain Assessment   Currently in Pain? No/denies    Multiple Pain Sites No           Capillary Blood Glucose: No results found for this or any previous visit (from the past 24 hour(s)).   Exercise Prescription Changes - 08/23/20 1300      Response to Exercise   Blood Pressure (Admit) 122/70    Blood Pressure (Exercise) 134/90    Blood Pressure (Exit) 130/78    Heart Rate (Admit) 66 bpm    Heart Rate (Exercise) 72 bpm    Heart Rate (Exit) 64 bpm    Oxygen Saturation (Admit) 89 %    Oxygen Saturation (Exercise) 88 %    Oxygen Saturation (Exit) 95 %    Rating of Perceived Exertion (Exercise) 13    Perceived Dyspnea (Exercise) 3    Duration Continue with 30 min of aerobic exercise without signs/symptoms of physical distress.    Intensity THRR unchanged      Progression   Progression Continue to progress workloads to maintain intensity without signs/symptoms of physical distress.      Resistance Training   Training Prescription Yes    Weight orange bands     Reps 10-15    Time 10 Minutes      Recumbant Bike   Level 2    Minutes 15    METs 1.4      NuStep   Level 4    SPM 80    Minutes 15    METs 1.9           Social History   Tobacco Use  Smoking Status Never Smoker  Smokeless Tobacco Never Used    Goals Met:  Exercise tolerated well No report of cardiac concerns or symptoms Strength training completed today  Goals Unmet:  Not Applicable  Comments: Service time is from 1015 to 1120    Dr. Fransico Him is Medical Director for Cardiac Rehab at [facility].

## 2020-08-25 ENCOUNTER — Encounter (HOSPITAL_COMMUNITY)
Admission: RE | Admit: 2020-08-25 | Discharge: 2020-08-25 | Disposition: A | Discharge status: Home / Self Care | Payer: Medicare PPO | Source: Ambulatory Visit | Attending: Emergency Medicine | Admitting: Emergency Medicine

## 2020-08-25 ENCOUNTER — Other Ambulatory Visit: Payer: Self-pay

## 2020-08-25 DIAGNOSIS — J984 Other disorders of lung: Secondary | ICD-10-CM | POA: Diagnosis not present

## 2020-08-25 NOTE — Progress Notes (Signed)
Daily Session Note  Patient Details  Name: Whitney [name] MRN: [id number] Date of Birth: 11/29/1955 Referring Provider:   April Manson Pulmonary Rehab Walk Test from 07/19/2020 in [place]  Referring Provider Dr. Lamonte Sakai      Encounter Date: 08/25/2020  Check In:  Session Check In - 08/25/20 1021      Check-In   Supervising physician immediately available to respond to emergencies [facility] immediately available    Physician(s) Dr. Tawanna Solo    [facility]    Staff Present Rosebud Poles, RN, BSN;Lisa Ysidro Evert, RN;Jessica Hassell Done, MS, ACSM-CEP, Exercise Physiologist    Virtual Visit No    Medication changes reported     No    Fall or balance concerns reported    No    Tobacco Cessation No Change    Warm-up and Cool-down Performed on first and last piece of equipment    Resistance Training Performed Yes    VAD Patient? No    PAD/SET Patient? No      Pain Assessment   Currently in Pain? No/denies    Multiple Pain Sites No           Capillary Blood Glucose: No results found for this or any previous visit (from the past 24 hour(s)).    Social History   Tobacco Use  Smoking Status Never Smoker  Smokeless Tobacco Never Used    Goals Met:  Proper associated with RPD/PD & O2 Sat Exercise tolerated well Strength training completed today  Goals Unmet:  Not Applicable  Comments: Service time is from 1010 to [place]    Dr. Fransico Him is Medical Director for Cardiac Rehab at [facility].

## 2020-08-25 NOTE — Progress Notes (Signed)
I have reviewed a Home Exercise Prescription with PAIGHTON GODETTE . Whitney [name] is not currently exercising at home. The patient was advised to walk and do resistance band exercises 2-3 additional days a week for 30 minutes.  Icy and I discussed how to progress their exercise prescription.  The patient stated that their goals were to improve shortness of breath, lose weight, and live a healthy lifestyle.  The patient stated that they understand the exercise prescription.  We reviewed exercise guidelines, target heart rate during exercise, RPE Scale, weather conditions, rescue inhaler use, endpoints for exercise, warmup and cool down.  Patient is encouraged to come to me with any questions. I will continue to follow up with the patient to assist them with progression and safety.    Rick Duff MS, ACSM CEP 11:54 AM 08/25/2020

## 2020-08-30 ENCOUNTER — Other Ambulatory Visit: Payer: Self-pay

## 2020-08-30 ENCOUNTER — Encounter (HOSPITAL_COMMUNITY)
Admission: RE | Admit: 2020-08-30 | Discharge: 2020-08-30 | Disposition: A | Discharge status: Home / Self Care | Payer: Medicare PPO | Source: Ambulatory Visit | Attending: Emergency Medicine | Admitting: Emergency Medicine

## 2020-08-30 DIAGNOSIS — J984 Other disorders of lung: Secondary | ICD-10-CM | POA: Diagnosis not present

## 2020-08-30 NOTE — Progress Notes (Signed)
Pulmonary Individual Treatment Plan  Patient Details  Name: Whitney [name] MRN: [id number] Date of Birth: 1955/07/14 Referring Provider:   April Manson Pulmonary Rehab Walk Test from 07/19/2020 in [facility]  Referring Provider Dr. Lamonte Sakai      Initial Encounter Date:  Flowsheet Row Pulmonary Rehab Walk Test from 07/19/2020 in [facility]  Date 07/19/20      Visit Diagnosis: Restrictive lung disease  Patient's Home Medications on Admission:   Current Outpatient Medications:  .  amoxicillin (AMOXIL) 500 MG capsule, Take 4 capsules 1 hour prior to any dental procedures (Patient not taking: Reported on 07/27/2020), Disp: 4 capsule, Rfl: 3 .  cholecalciferol (D-VI-SOL) 10 MCG/ML LIQD, Take 25 mcg by mouth daily. (Patient not taking: Reported on 07/27/2020), Disp: , Rfl:  .  cyanocobalamin 1000 MCG tablet, Take 1,000 mcg by mouth daily., Disp: , Rfl:  .  ezetimibe (ZETIA) 10 MG tablet, Take 10 mg by mouth at bedtime. , Disp: , Rfl: 11 .  milk thistle 175 MG tablet, Take 175 mg by mouth daily., Disp: , Rfl:  .  nadolol (CORGARD) 20 MG tablet, Take 20 mg by mouth daily., Disp: , Rfl:  .  PROAIR HFA 108 (90 Base) MCG/ACT inhaler, INHALE 2 PUFFS INTO THE LUNGS EVERY 4 (FOUR) HOURS AS NEEDED FOR WHEEZING OR SHORTNESS OF BREATH., Disp: 8.5 each, Rfl: 5 .  pyridOXINE (B-6) 50 MG tablet, Take by mouth., Disp: , Rfl:  .  TURMERIC PO, Take by mouth., Disp: , Rfl:   Past Medical History: Past Medical History:  Diagnosis Date  . Allergic rhinitis   . Anemia    hx  . Arthritis   . Asthma    hx yrs ago  . Cirrhosis ([place]) last albumin 3.3 done at [facility] 06-16-2014 (under care everywhere tab in epic)   Secondary to Fatty liver --  followed by hepatology at [facility] (dr Gerald Dexter)   . Depression   . Diabetes mellitus type II    type 2 diet conrolled  . Dyspnea   . Fibromyalgia   . GERD (gastroesophageal reflux disease)   . Heart murmur     asymptomatic ---  1989 from rhuematic fever  . History of exercise stress test    05-05-2013---  negative bruce ETT given exercise workload,  no ischemia  . History of hiatal hernia   . History of kidney stones   . History of rheumatic fever    1989  . Hyperlipidemia   . Leukocytopenia   . Moderate aortic stenosis    AVA area 1.1cm2---  cardiologist --  dr Concepcion Living, 2014 in epic  . NASH (nonalcoholic steatohepatitis)   . OSA (obstructive sleep apnea)    was using CPAP before gastric sleeve 2015--  no uses after wt loss  . Pneumonia    hx  . Sjogren's syndrome ([place])   . Thrombocytopenia ([place])     Tobacco Use: Social History   Tobacco Use  Smoking Status Never Smoker  Smokeless Tobacco Never Used    Labs: Recent Review Flowsheet Data    Labs for ITP Cardiac and Pulmonary Rehab Latest Ref Rng & Units 12/05/2017 12/05/2017 12/05/2017 12/05/2017 12/17/2018   Hemoglobin A1c 4.8 - 5.6 % - - - - -   PHART 7.350 - 7.450 7.430 7.349(L) - 7.326(L) -   PCO2ART 32.0 - 48.0 mmHg 38.9 41.7 - 43.5 -   HCO3 20.0 - 28.0 mmol/L 25.8 23.2 - 22.8 -  TCO2 22 - 32 mmol/L 27 25 23 24 27    ACIDBASEDEF 0.0 - 2.0 mmol/L - 3.0(H) - 3.0(H) -   O2SAT % 100.0 97.0 - 97.0 -      Capillary Blood Glucose: Lab Results  Component Value Date   GLUCAP 77 12/17/2018   GLUCAP 79 02/12/2018   GLUCAP 100 (H) 12/12/2017   GLUCAP 166 (H) 12/11/2017   GLUCAP 161 (H) 12/11/2017     Pulmonary Assessment Scores:  Pulmonary Assessment Scores    Row Name 07/11/20 1644 07/19/20 1628       ADL UCSD   ADL Phase Entry --    SOB Score total 78 --         CAT Score   CAT Score 36 --         mMRC Score   mMRC Score -- 4          UCSD: Self-administered rating of dyspnea associated with activities of daily living (ADLs) 6-point scale (0 = "not at all" to 5 = "maximal or unable to do because of breathlessness")  Scoring Scores range from 0 to 120.  Minimally important difference is 5 units  CAT: CAT  can identify the health impairment of COPD patients and is better correlated with disease progression.  CAT has a scoring range of zero to 40. The CAT score is classified into four groups of low (less than 10), medium (10 - 20), high (21-30) and very high (31-40) based on the impact level of disease on health status. A CAT score over 10 suggests significant symptoms.  A worsening CAT score could be explained by an exacerbation, poor medication adherence, poor inhaler technique, or progression of COPD or comorbid conditions.  CAT MCID is 2 points  mMRC: mMRC (Modified Medical Research Council) Dyspnea Scale is used to assess the degree of baseline functional disability in patients of respiratory disease due to dyspnea. No minimal important difference is established. A decrease in score of 1 point or greater is considered a positive change.   Pulmonary Function Assessment:  Pulmonary Function Assessment - 07/11/20 1119      Breath   Shortness of Breath Yes;Limiting activity;Fear of Shortness of Breath           Exercise Target Goals: Exercise Program Goal: Individual exercise prescription set using results from initial 6 min walk test and THRR while considering  patient's activity barriers and safety.   Exercise Prescription Goal: Initial exercise prescription builds to 30-45 minutes a day of aerobic activity, 2-3 days per week.  Home exercise guidelines will be given to patient during program as part of exercise prescription that the participant will acknowledge.  Activity Barriers & Risk Stratification:  Activity Barriers & Cardiac Risk Stratification - 07/11/20 1111      Activity Barriers & Cardiac Risk Stratification   Activity Barriers Arthritis;Fibromyalgia;Deconditioning;Shortness of Breath;Balance Concerns    Comments sjogren syndrome           6 Minute Walk:  6 Minute Walk    Row Name 07/19/20 1201         6 Minute Walk   Phase Initial     Distance 800 feet      Walk Time 6 minutes     # of Rest Breaks 0     MPH 1.52     METS 1.67     RPE 14     Perceived Dyspnea  3     VO2 Peak 5.84     Symptoms  Yes (comment)     Comments Patients oxygen saturation dropped to 84% on RA. Thought this may have been due to her cold hands. Had her stop at minute 4 to perform pursed lip breathing and check pulse ox. Oxygen saturation came back up once I warmed her fingers a little. I think the 84% was an error. We will continue to monitor oxygen saturation and supplemental oxygen needs.     Resting HR 61 bpm     Resting BP 148/80     Resting Oxygen Saturation  95 %     Exercise Oxygen Saturation  during 6 min walk 84 %     Max Ex. HR 85 bpm     Max Ex. BP 160/84     2 Minute Post BP 146/80           Interval HR   1 Minute HR 74     2 Minute HR 82     3 Minute HR 85     4 Minute HR 68  This was when I had her stop to perform pursed lip breathing     5 Minute HR 75     6 Minute HR 65     Interval Heart Rate? Yes           Interval Oxygen   Interval Oxygen? Yes     Baseline Oxygen Saturation % 95 %     1 Minute Oxygen Saturation % 94 %     1 Minute Liters of Oxygen 0 L     2 Minute Oxygen Saturation % 87 %     2 Minute Liters of Oxygen 0 L     3 Minute Oxygen Saturation % 85 %     3 Minute Liters of Oxygen 0 L     4 Minute Oxygen Saturation % 84 %  Stopped and performed pursed lip breathing     4 Minute Liters of Oxygen 0 L     5 Minute Oxygen Saturation % 87 %     5 Minute Liters of Oxygen 0 L     6 Minute Oxygen Saturation % 92 %     6 Minute Liters of Oxygen 0 L     2 Minute Post Oxygen Saturation % 95 %     2 Minute Post Liters of Oxygen 0 L            Oxygen Initial Assessment:  Oxygen Initial Assessment - 07/19/20 1210      Home Oxygen   Home Oxygen Device None    Sleep Oxygen Prescription CPAP    Home Exercise Oxygen Prescription None    Home Resting Oxygen Prescription None    Compliance with Home Oxygen Use Yes      Initial 6 min  Walk   Oxygen Used None      Program Oxygen Prescription   Program Oxygen Prescription None   May possibly need supplemental oxygen with walking     Intervention   Short Term Goals To learn and exhibit compliance with exercise, home and travel O2 prescription;To learn and understand importance of monitoring SPO2 with pulse oximeter and demonstrate accurate use of the pulse oximeter.;To learn and understand importance of maintaining oxygen saturations>88%;To learn and demonstrate proper pursed lip breathing techniques or other breathing techniques.;To learn and demonstrate proper use of respiratory medications    Long  Term Goals Exhibits compliance with exercise, home and travel O2 prescription;Verbalizes importance of monitoring SPO2 with pulse oximeter and  return demonstration;Maintenance of O2 saturations>88%;Exhibits proper breathing techniques, such as pursed lip breathing or other method taught during program session;Compliance with respiratory medication;Demonstrates proper use of MDI's           Oxygen Re-Evaluation:  Oxygen Re-Evaluation    Row Name 08/02/20 0745 08/26/20 1449           Program Oxygen Prescription   Program Oxygen Prescription None None             Home Oxygen   Home Oxygen Device None None      Sleep Oxygen Prescription CPAP CPAP      Home Exercise Oxygen Prescription None None      Home Resting Oxygen Prescription None None      Compliance with Home Oxygen Use Yes Yes             Goals/Expected Outcomes   Short Term Goals To learn and exhibit compliance with exercise, home and travel O2 prescription;To learn and understand importance of monitoring SPO2 with pulse oximeter and demonstrate accurate use of the pulse oximeter.;To learn and understand importance of maintaining oxygen saturations>88%;To learn and demonstrate proper pursed lip breathing techniques or other breathing techniques.;To learn and demonstrate proper use of respiratory medications To  learn and exhibit compliance with exercise, home and travel O2 prescription;To learn and understand importance of monitoring SPO2 with pulse oximeter and demonstrate accurate use of the pulse oximeter.;To learn and understand importance of maintaining oxygen saturations>88%;To learn and demonstrate proper pursed lip breathing techniques or other breathing techniques.;To learn and demonstrate proper use of respiratory medications      Long  Term Goals Exhibits compliance with exercise, home and travel O2 prescription;Verbalizes importance of monitoring SPO2 with pulse oximeter and return demonstration;Maintenance of O2 saturations>88%;Exhibits proper breathing techniques, such as pursed lip breathing or other method taught during program session;Compliance with respiratory medication;Demonstrates proper use of MDI's Exhibits compliance with exercise, home and travel O2 prescription;Verbalizes importance of monitoring SPO2 with pulse oximeter and return demonstration;Maintenance of O2 saturations>88%;Exhibits proper breathing techniques, such as pursed lip breathing or other method taught during program session;Compliance with respiratory medication;Demonstrates proper use of MDI's      Comments -- Pt is compliant with CPAP prescription and is becoming independent with pursed lip breathing without cueing      Goals/Expected Outcomes Compliance and understanding of oxygen saturation and pursed lip breathing Compliance and understanding of oxygen saturation and pursed lip breathing             Oxygen Discharge (Final Oxygen Re-Evaluation):  Oxygen Re-Evaluation - 08/26/20 1449      Program Oxygen Prescription   Program Oxygen Prescription None      Home Oxygen   Home Oxygen Device None    Sleep Oxygen Prescription CPAP    Home Exercise Oxygen Prescription None    Home Resting Oxygen Prescription None    Compliance with Home Oxygen Use Yes      Goals/Expected Outcomes   Short Term Goals To learn  and exhibit compliance with exercise, home and travel O2 prescription;To learn and understand importance of monitoring SPO2 with pulse oximeter and demonstrate accurate use of the pulse oximeter.;To learn and understand importance of maintaining oxygen saturations>88%;To learn and demonstrate proper pursed lip breathing techniques or other breathing techniques.;To learn and demonstrate proper use of respiratory medications    Long  Term Goals Exhibits compliance with exercise, home and travel O2 prescription;Verbalizes importance of monitoring SPO2 with pulse oximeter and return demonstration;Maintenance of O2  saturations>88%;Exhibits proper breathing techniques, such as pursed lip breathing or other method taught during program session;Compliance with respiratory medication;Demonstrates proper use of MDI's    Comments Pt is compliant with CPAP prescription and is becoming independent with pursed lip breathing without cueing    Goals/Expected Outcomes Compliance and understanding of oxygen saturation and pursed lip breathing           Initial Exercise Prescription:  Initial Exercise Prescription - 07/19/20 1200      Date of Initial Exercise RX and Referring Provider   Date 07/19/20    Referring Provider Dr. Lamonte Sakai    Expected Discharge Date 09/20/20      NuStep   Level 2    SPM 80    Minutes 15      Track   Minutes 15      Prescription Details   Frequency (times per week) 2    Duration Progress to 30 minutes of continuous aerobic without signs/symptoms of physical distress      Intensity   THRR 40-80% of Max Heartrate 62-123    Ratings of Perceived Exertion 11-13    Perceived Dyspnea 0-4      Progression   Progression Continue to progress workloads to maintain intensity without signs/symptoms of physical distress.      Resistance Training   Training Prescription Yes    Weight orange bands    Reps 10-15           Perform Capillary Blood Glucose checks as  needed.  Exercise Prescription Changes:  Exercise Prescription Changes    Row Name 07/26/20 1100 08/09/20 1200 08/23/20 1300 08/25/20 1100       Response to Exercise   Blood Pressure (Admit) 134/74 138/82 122/70 --    Blood Pressure (Exercise) 150/80 140/84 134/90 --    Blood Pressure (Exit) 146/80 136/72 130/78 --    Heart Rate (Admit) 65 bpm 62 bpm 66 bpm --    Heart Rate (Exercise) 77 bpm 64 bpm 72 bpm --    Heart Rate (Exit) 69 bpm 63 bpm 64 bpm --    Oxygen Saturation (Admit) 92 % 94 % 89 % --    Oxygen Saturation (Exercise) 87 %  Had pt to do pursed lip breathing saturation increased to 90%. 91 % 88 % --    Oxygen Saturation (Exit) 93 % 95 % 95 % --    Rating of Perceived Exertion (Exercise) 15 15 13  --    Perceived Dyspnea (Exercise) 3 3 3  --    Duration Progress to 30 minutes of  aerobic without signs/symptoms of physical distress Continue with 30 min of aerobic exercise without signs/symptoms of physical distress. Continue with 30 min of aerobic exercise without signs/symptoms of physical distress. --    Intensity THRR unchanged THRR unchanged THRR unchanged --         Progression   Progression Continue to progress workloads to maintain intensity without signs/symptoms of physical distress. Continue to progress workloads to maintain intensity without signs/symptoms of physical distress. Continue to progress workloads to maintain intensity without signs/symptoms of physical distress. --         Horticulturist, commercial Prescription Yes Yes Yes --    Weight orange bands orange bands orange bands --    Reps 10-15 10-15 10-15 --    Time 10 Minutes 10 Minutes 10 Minutes --         Recumbant Bike   Level 1.5 1.5 2 --    Minutes  15 15 15  --    METs 1.4 1.4 1.4 --         NuStep   Level 2 3 4  --    SPM 80 80 80 --    Minutes 15 15 15  --    METs 1.8 1.7 1.9 --         Home Exercise Plan   Plans to continue exercise at -- -- -- Home (comment)  Walking and  resistance training    Frequency -- -- -- Add 2 additional days to program exercise sessions.    Initial Home Exercises Provided -- -- -- 08/25/20           Exercise Comments:  Exercise Comments    Row Name 07/21/20 1157 08/25/20 1150         Exercise Comments Patient completed first day of exercise and tolerated well for the most part. She was able to do 15 minutes on the Nustep with no issues. I had her walking the track for 15 minutes but 3 minutes into it she stated that she was not going to walk the track any longer because she felt light headed and dizzy. Vital signs were fine. She also complained of hip pain. Pt agreed to get back on the Nustep for 10 more minutes. She tolerated resistance bands and stretches well with no complaints. Will continue to monitor and adjust exercise prescription as needed. Completed home exercise prescription with pt. Pt is currently not exercising at home. Discussed adding 2-3 additional days of exercise outside of rehab. Pt was receptive. Will continue to follow on home exercise prescription.             Exercise Goals and Review:  Exercise Goals    Row Name 07/11/20 1612             Exercise Goals   Increase Physical Activity Yes       Intervention Provide advice, education, support and counseling about physical activity/exercise needs.;Develop an individualized exercise prescription for aerobic and resistive training based on initial evaluation findings, risk stratification, comorbidities and participant's personal goals.       Expected Outcomes Short Term: Attend rehab on a regular basis to increase amount of physical activity.;Long Term: Add in home exercise to make exercise part of routine and to increase amount of physical activity.;Long Term: Exercising regularly at least 3-5 days a week.       Increase Strength and Stamina Yes       Intervention Provide advice, education, support and counseling about physical activity/exercise  needs.;Develop an individualized exercise prescription for aerobic and resistive training based on initial evaluation findings, risk stratification, comorbidities and participant's personal goals.       Expected Outcomes Short Term: Increase workloads from initial exercise prescription for resistance, speed, and METs.;Short Term: Perform resistance training exercises routinely during rehab and add in resistance training at home;Long Term: Improve cardiorespiratory fitness, muscular endurance and strength as measured by increased METs and functional capacity (6MWT)       Able to understand and use rate of perceived exertion (RPE) scale Yes       Intervention Provide education and explanation on how to use RPE scale       Expected Outcomes Short Term: Able to use RPE daily in rehab to express subjective intensity level;Long Term:  Able to use RPE to guide intensity level when exercising independently       Able to understand and use Dyspnea scale Yes  Intervention Provide education and explanation on how to use Dyspnea scale       Expected Outcomes Short Term: Able to use Dyspnea scale daily in rehab to express subjective sense of shortness of breath during exertion;Long Term: Able to use Dyspnea scale to guide intensity level when exercising independently       Knowledge and understanding of Target Heart Rate Range (THRR) Yes       Intervention Provide education and explanation of THRR including how the numbers were predicted and where they are located for reference       Expected Outcomes Short Term: Able to state/look up THRR;Long Term: Able to use THRR to govern intensity when exercising independently;Short Term: Able to use daily as guideline for intensity in rehab       Understanding of Exercise Prescription Yes       Intervention Provide education, explanation, and written materials on patient's individual exercise prescription       Expected Outcomes Short Term: Able to explain program  exercise prescription;Long Term: Able to explain home exercise prescription to exercise independently              Exercise Goals Re-Evaluation :  Exercise Goals Re-Evaluation    Row Name 08/02/20 0742 08/26/20 1443           Exercise Goal Re-Evaluation   Exercise Goals Review Increase Physical Activity;Increase Strength and Stamina;Able to understand and use rate of perceived exertion (RPE) scale;Able to understand and use Dyspnea scale;Knowledge and understanding of Target Heart Rate Range (THRR);Understanding of Exercise Prescription Increase Physical Activity;Increase Strength and Stamina;Able to understand and use rate of perceived exertion (RPE) scale;Able to understand and use Dyspnea scale;Knowledge and understanding of Target Heart Rate Range (THRR);Understanding of Exercise Prescription      Comments Pt has completed 3 exercise sessions. It is too early to see much progression. The first session we had her on the Nustep for 15 minutes and walking the track for 15 minutes. Pt stated that she was not going to walk the track because she did not feel comfortable doing so. Her new exercise prescription is 15 minutes on the Nustep and 15 minutes on the Recumbent bike. So far she has tolerated this well. She is exercising at 1.9 METS on the Nustep and 1.5 METS on the Bike. Will continue to monitor and progress as she is able. [name] has completed 10 exercise sessions and has been a little slow with workload and MET level increases. She does not seem very motivated to exercise, but she has participated in cardiac and pulmonary rehab before. We recently discussed home exercise and she is not doing any from of exercise outside of rehab so we encouraged exercise at least 2 days at home. Pt said she would try, so we will continue to follow up on that. Currently she is exercising at 2.0 METS on the Nustep and 1.4 METS on the bike. Will continue to monitor and progress as she is able.      Expected Outcomes  Through exercise at rehab and at home, the patient will decrease shortness of breath with daily activities and feel confident in carrying out an exercise regime at home. Through exercise at rehab and at home, the patient will decrease shortness of breath with daily activities and feel confident in carrying out an exercise regime at home. Pt to start home exercise now that she is a month into the program.  Discharge Exercise Prescription (Final Exercise Prescription Changes):  Exercise Prescription Changes - 08/25/20 1100      Home Exercise Plan   Plans to continue exercise at Home (comment)   Walking and resistance training   Frequency Add 2 additional days to program exercise sessions.    Initial Home Exercises Provided 08/25/20           Nutrition:  Target Goals: Understanding of nutrition guidelines, daily intake of sodium <1584m, cholesterol <2065m calories 30% from fat and 7% or less from saturated fats, daily to have 5 or more servings of fruits and vegetables.  Biometrics:  Pre Biometrics - 07/19/20 1201      Pre Biometrics   Grip Strength 23 kg            Nutrition Therapy Plan and Nutrition Goals:  Nutrition Therapy & Goals - 08/03/20 1501      Nutrition Therapy   Diet Therapeutic Lifestyle Changes      Personal Nutrition Goals   Nutrition Goal Pt to identify food quantities necessary to achieve weight loss of 6-24 lb at graduation from pulmonary rehab.    Personal Goal #2 Pt to build a healthy plate including vegetables, fruits, whole grains, and low-fat dairy products in a heart healthy meal plan.    Personal Goal #3 Pt to eat 80-90 g protein and >32 oz water daily      Intervention Plan   Intervention Prescribe, educate and counsel regarding individualized specific dietary modifications aiming towards targeted core components such as weight, hypertension, lipid management, diabetes, heart failure and other comorbidities.    Expected Outcomes Short  Term Goal: Understand basic principles of dietary content, such as calories, fat, sodium, cholesterol and nutrients.;Long Term Goal: Adherence to prescribed nutrition plan.           Nutrition Assessments:  MEDIFICTS Score Key:  ?70 Need to make dietary changes   40-70 Heart Healthy Diet  ? 40 Therapeutic Level Cholesterol Diet   Picture Your Plate Scores:  <4<45nhealthy dietary pattern with much room for improvement.  41-50 Dietary pattern unlikely to meet recommendations for good health and room for improvement.  51-60 More healthful dietary pattern, with some room for improvement.   >60 Healthy dietary pattern, although there may be some specific behaviors that could be improved.    Nutrition Goals Re-Evaluation:  Nutrition Goals Re-Evaluation    [place] 07/27/20 1457 08/03/20 1501 08/26/20 0826         Goals   Current Weight 221 lb 5.5 oz (100.4 kg) 221 lb 5.5 oz (100.4 kg) 222 lb 0.1 oz (100.7 kg)     Nutrition Goal -- -- Pt to identify food quantities necessary to achieve weight loss of 6-24 lb at graduation from pulmonary rehab.     Comment -- -- Weight maintained           Personal Goal #2 Re-Evaluation   Personal Goal #2 -- -- Pt to build a healthy plate including vegetables, fruits, whole grains, and low-fat dairy products in a heart healthy meal plan.           Personal Goal #3 Re-Evaluation   Personal Goal #3 -- -- Pt to eat 80-90 g protein and >32 oz water daily            Nutrition Goals Discharge (Final Nutrition Goals Re-Evaluation):  Nutrition Goals Re-Evaluation - 08/26/20 0826      Goals   Current Weight 222 lb 0.1 oz (100.7 kg)  Nutrition Goal Pt to identify food quantities necessary to achieve weight loss of 6-24 lb at graduation from pulmonary rehab.    Comment Weight maintained      Personal Goal #2 Re-Evaluation   Personal Goal #2 Pt to build a healthy plate including vegetables, fruits, whole grains, and low-fat dairy products in  a heart healthy meal plan.      Personal Goal #3 Re-Evaluation   Personal Goal #3 Pt to eat 80-90 g protein and >32 oz water daily           Psychosocial: Target Goals: Acknowledge presence or absence of significant depression and/or stress, maximize coping skills, provide positive support system. Participant is able to verbalize types and ability to use techniques and skills needed for reducing stress and depression.  Initial Review & Psychosocial Screening:  Initial Psych Review & Screening - 07/11/20 1120      Initial Review   Current issues with None Identified;Current Stress Concerns    Source of Stress Concerns Chronic Illness      Family Dynamics   Good Support System? Yes   Son and Daughter and grandchildren     Barriers   Psychosocial barriers to participate in program The patient should benefit from training in stress management and relaxation.      Screening Interventions   Interventions Encouraged to exercise           Quality of Life Scores:  Scores of 19 and below usually indicate a poorer quality of life in these areas.  A difference of  2-3 points is a clinically meaningful difference.  A difference of 2-3 points in the total score of the Quality of Life Index has been associated with significant improvement in overall quality of life, self-image, physical symptoms, and general health in studies assessing change in quality of life.  PHQ-9: Recent Review Flowsheet Data    Depression screen [facility] 2/9 07/11/2020 03/11/2019 03/11/2019 04/28/2018 02/12/2018   Decreased Interest 1 1 1  0 0   Down, Depressed, Hopeless 1 1 1  0 0   PHQ - 2 Score 2 2 2  0 0   Altered sleeping 1 1 1  - -   Tired, decreased energy 2 1 1  - -   Change in appetite 1 0 0 - -   Feeling bad or failure about yourself  0 0 0 - -   Trouble concentrating 0 0 0 - -   Moving slowly or fidgety/restless 0 0 0 - -   Suicidal thoughts 0 0 0 - -   PHQ-9 Score 6 4 4  - -   Difficult doing work/chores  Somewhat difficult Not difficult at all - - -     Interpretation of Total Score  Total Score Depression Severity:  1-4 = Minimal depression, 5-9 = Mild depression, 10-14 = Moderate depression, 15-19 = Moderately severe depression, 20-27 = Severe depression   Psychosocial Evaluation and Intervention:  Psychosocial Evaluation - 07/11/20 1613      Psychosocial Evaluation & Interventions   Interventions Encouraged to exercise with the program and follow exercise prescription    Comments [name] reports that she does feel depressed some days due to her illness and inability to do things she once could. She refuses treatement but has a positive outlook.    Expected Outcomes Improve mental well being and decrease depression symptoms    Continue Psychosocial Services  Follow up required by staff           Psychosocial Re-Evaluation:  Psychosocial  Re-Evaluation    Row Name 08/01/20 1119 08/23/20 0829           Psychosocial Re-Evaluation   Current issues with Current Stress Concerns Current Stress Concerns      Comments [name] is frustrated with her decline in shortness of breath with any activity. [name] is handling her every day stress well, her son who has major depression lives with her.      Expected Outcomes For [name] to handle her stress in healthy ways. For [name] to handle her stress in healthy ways.      Interventions Encouraged to attend Pulmonary Rehabilitation for the exercise;Relaxation education;Stress management education Encouraged to attend Pulmonary Rehabilitation for the exercise;Stress management education;Relaxation education      Continue Psychosocial Services  -- No Follow up required             Initial Review   Source of Stress Concerns Chronic Illness Family             Psychosocial Discharge (Final Psychosocial Re-Evaluation):  Psychosocial Re-Evaluation - 08/23/20 0829      Psychosocial Re-Evaluation   Current issues with Current Stress Concerns    Comments [name] is  handling her every day stress well, her son who has major depression lives with her.    Expected Outcomes For [name] to handle her stress in healthy ways.    Interventions Encouraged to attend Pulmonary Rehabilitation for the exercise;Stress management education;Relaxation education    Continue Psychosocial Services  No Follow up required      Initial Review   Source of Stress Concerns Family           Education: Education Goals: Education classes will be provided on a weekly basis, covering required topics. Participant will state understanding/return demonstration of topics presented.  Learning Barriers/Preferences:  Learning Barriers/Preferences - 07/11/20 1124      Learning Barriers/Preferences   Learning Barriers None    Learning Preferences Skilled Demonstration;Individual Instruction;Written Material           Education Topics: Risk Factor Reduction:  -Group instruction that is supported by a PowerPoint presentation. Instructor discusses the definition of a risk factor, different risk factors for pulmonary disease, and how the heart and lungs work together.   Flowsheet Row PULMONARY REHAB OTHER RESPIRATORY from 08/04/2020 in [place]  Date 08/04/20  Educator Handout-Jess  Instruction Review Code 2- Demonstrated Understanding      Nutrition for Pulmonary Patient:  -Group instruction provided by PowerPoint slides, verbal discussion, and written materials to support subject matter. The instructor gives an explanation and review of healthy diet recommendations, which includes a discussion on weight management, recommendations for fruit and vegetable consumption, as well as protein, fluid, caffeine, fiber, sodium, sugar, and alcohol. Tips for eating when patients are short of breath are discussed.   Pursed Lip Breathing:  -Group instruction that is supported by demonstration and informational handouts. Instructor discusses the benefits of pursed  lip and diaphragmatic breathing and detailed demonstration on how to preform both.     Oxygen Safety:  -Group instruction provided by PowerPoint, verbal discussion, and written material to support subject matter. There is an overview of "What is Oxygen" and "Why do we need it".  Instructor also reviews how to create a safe environment for oxygen use, the importance of using oxygen as prescribed, and the risks of noncompliance. There is a brief discussion on traveling with oxygen and resources the patient may utilize.   Oxygen Equipment:  -Group  instruction provided by [facility] Staff utilizing handouts, written materials, and equipment demonstrations.   Signs and Symptoms:  -Group instruction provided by written material and verbal discussion to support subject matter. Warning signs and symptoms of infection, stroke, and heart attack are reviewed and when to call the physician/911 reinforced. Tips for preventing the spread of infection discussed.   Advanced Directives:  -Group instruction provided by verbal instruction and written material to support subject matter. Instructor reviews Advanced Directive laws and proper instruction for filling out document.   Pulmonary Video:  -Group video education that reviews the importance of medication and oxygen compliance, exercise, good nutrition, pulmonary hygiene, and pursed lip and diaphragmatic breathing for the pulmonary patient.   Exercise for the Pulmonary Patient:  -Group instruction that is supported by a PowerPoint presentation. Instructor discusses benefits of exercise, core components of exercise, frequency, duration, and intensity of an exercise routine, importance of utilizing pulse oximetry during exercise, safety while exercising, and options of places to exercise outside of rehab.     Pulmonary Medications:  -Verbally interactive group education provided by instructor with focus on inhaled medications and proper  administration. Flowsheet Row PULMONARY REHAB OTHER RESPIRATORY from 08/04/2020 in [place]  Date 04/23/19  Educator --  [Handout]      Anatomy and Physiology of the Respiratory System and Intimacy:  -Group instruction provided by PowerPoint, verbal discussion, and written material to support subject matter. Instructor reviews respiratory cycle and anatomical components of the respiratory system and their functions. Instructor also reviews differences in obstructive and restrictive respiratory diseases with examples of each. Intimacy, Sex, and Sexuality differences are reviewed with a discussion on how relationships can change when diagnosed with pulmonary disease. Common sexual concerns are reviewed. Flowsheet Row PULMONARY REHAB OTHER RESPIRATORY from 08/04/2020 in [place]  Date 04/07/19  Educator --  [Handout]      MD DAY -A group question and answer session with a medical doctor that allows participants to ask questions that relate to their pulmonary disease state.   OTHER EDUCATION -Group or individual verbal, written, or video instructions that support the educational goals of the pulmonary rehab program.   Holiday Eating Survival Tips:  -Group instruction provided by PowerPoint slides, verbal discussion, and written materials to support subject matter. The instructor gives patients tips, tricks, and techniques to help them not only survive but enjoy the holidays despite the onslaught of food that accompanies the holidays. Flowsheet Row PULMONARY REHAB OTHER RESPIRATORY from 08/04/2020 in [place]  Date 03/24/19  Educator 03/31/19  [Handout]      Knowledge Questionnaire Score:  Knowledge Questionnaire Score - 07/11/20 1644      Knowledge Questionnaire Score   Pre Score 17/18           Core Components/Risk Factors/Patient Goals at Admission:  Personal Goals and Risk  Factors at Admission - 07/11/20 1615      Core Components/Risk Factors/Patient Goals on Admission    Weight Management Weight Loss    Intervention Weight Management: Develop a combined nutrition and exercise program designed to reach desired caloric intake, while maintaining appropriate intake of nutrient and fiber, sodium and fats, and appropriate energy expenditure required for the weight goal.;Obesity: Provide education and appropriate resources to help participant work on and attain dietary goals.;Weight Management/Obesity: Establish reasonable short term and long term weight goals.    Admit Weight 221 lb (100.2 kg)    Goal Weight:  Short Term 200 lb (90.7 kg)    Goal Weight: Long Term 180 lb (81.6 kg)    Expected Outcomes Short Term: Continue to assess and modify interventions until short term weight is achieved;Long Term: Adherence to nutrition and physical activity/exercise program aimed toward attainment of established weight goal;Weight Loss: Understanding of general recommendations for a balanced deficit meal plan, which promotes 1-2 lb weight loss per week and includes a negative energy balance of [phone] kcal/d    Improve shortness of breath with ADL's Yes    Intervention Provide education, individualized exercise plan and daily activity instruction to help decrease symptoms of SOB with activities of daily living.    Expected Outcomes Short Term: Improve cardiorespiratory fitness to achieve a reduction of symptoms when performing ADLs;Long Term: Be able to perform more ADLs without symptoms or delay the onset of symptoms    Diabetes --   Pre-diabetes   Hypertension Yes    Intervention Provide education on lifestyle modifcations including regular physical activity/exercise, weight management, moderate sodium restriction and increased consumption of fresh fruit, vegetables, and low fat dairy, alcohol moderation, and smoking cessation.;Monitor prescription use compliance.    Expected Outcomes  Long Term: Maintenance of blood pressure at goal levels.           Core Components/Risk Factors/Patient Goals Review:   Goals and Risk Factor Review    Row Name 08/01/20 1125 08/23/20 0831           Core Components/Risk Factors/Patient Goals Review   Personal Goals Review Develop more efficient breathing techniques such as purse lipped breathing and diaphragmatic breathing and practicing self-pacing with activity.;Increase knowledge of respiratory medications and ability to use respiratory devices properly.;Improve shortness of breath with ADL's Develop more efficient breathing techniques such as purse lipped breathing and diaphragmatic breathing and practicing self-pacing with activity.;Increase knowledge of respiratory medications and ability to use respiratory devices properly.;Improve shortness of breath with ADL's      Review [name] just started the program, has exercised 3 times, too early to see progression towards goals of improving shortness of breath. 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  So far she has not required supplemental oxygen to exercise as long as she is deep breathing.      Expected Outcomes See admission goals. For [name] to improve her deconditioning, stamina, and shortness of breath  and to exercise independently.             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  So far she has not required supplemental oxygen to exercise as long as she is deep breathing.    Expected Outcomes For [name] to improve her deconditioning, stamina, and shortness of breath  and to exercise independently.           ITP Comments:   Comments: ITP REVIEW Pt is making expected progress toward pulmonary rehab goals after completing 10 sessions. Recommend continued exercise, life style modification, education, and utilization of breathing techniques  to increase stamina and strength and decrease shortness of breath with exertion.

## 2020-08-30 NOTE — Progress Notes (Signed)
Daily Session Note  Patient Details  Name: Whitney [name] MRN: [id number] Date of Birth: 06-Mar-1956 Referring Provider:   April Manson Pulmonary Rehab Walk Test from 07/19/2020 in [place]  Referring Provider Dr. Lamonte Sakai      Encounter Date: 08/30/2020  Check In:  Session Check In - 08/30/20 1124      Check-In   Supervising physician immediately available to respond to emergencies [facility] immediately available    Physician(s) Dr. Tawanna Solo    Location [facility]    Staff Present Rosebud Poles, RN, BSN;Lisa Ysidro Evert, RN;Sabriel Borromeo Hassell Done, MS, ACSM-CEP, Exercise Physiologist    Virtual Visit No    Medication changes reported     No    Fall or balance concerns reported    No    Tobacco Cessation No Change    Warm-up and Cool-down Performed on first and last piece of equipment    Resistance Training Performed Yes    VAD Patient? No    PAD/SET Patient? No      Pain Assessment   Currently in Pain? No/denies    Multiple Pain Sites No           Capillary Blood Glucose: No results found for this or any previous visit (from the past 24 hour(s)).    Social History   Tobacco Use  Smoking Status Never Smoker  Smokeless Tobacco Never Used    Goals Met:  Proper associated with RPD/PD & O2 Sat Independence with exercise equipment Exercise tolerated well No report of cardiac concerns or symptoms Strength training completed today  Goals Unmet:  Not Applicable  Comments: Service time is from 1007 to 1100    Dr. Fransico Him is Medical Director for Cardiac Rehab at [facility].

## 2020-09-01 ENCOUNTER — Telehealth (HOSPITAL_COMMUNITY): Payer: Self-pay | Admitting: Family Medicine

## 2020-09-01 ENCOUNTER — Encounter (HOSPITAL_COMMUNITY): Payer: Medicare PPO

## 2020-09-06 ENCOUNTER — Other Ambulatory Visit: Payer: Self-pay

## 2020-09-06 ENCOUNTER — Encounter (HOSPITAL_COMMUNITY)
Admission: RE | Admit: 2020-09-06 | Discharge: 2020-09-06 | Disposition: A | Discharge status: Home / Self Care | Payer: Medicare PPO | Source: Ambulatory Visit | Attending: Emergency Medicine | Admitting: Emergency Medicine

## 2020-09-06 VITALS — Wt 220.2 lb

## 2020-09-06 DIAGNOSIS — J984 Other disorders of lung: Secondary | ICD-10-CM | POA: Diagnosis not present

## 2020-09-06 DIAGNOSIS — J986 Disorders of diaphragm: Secondary | ICD-10-CM | POA: Diagnosis not present

## 2020-09-06 NOTE — Progress Notes (Signed)
Daily Session Note  Patient Details  Name: Whitney [name] MRN: [id number] Date of Birth: Feb 10, 1956 Referring Provider:   April Manson Pulmonary Rehab Walk Test from 07/19/2020 in [place]  Referring Provider Dr. Lamonte Sakai      Encounter Date: 09/06/2020  Check In:  Session Check In - 09/06/20 1212      Check-In   Supervising physician immediately available to respond to emergencies [facility] immediately available    Physician(s) Dr. Kurtis Bushman    [facility]    Staff Present Rosebud Poles, RN, Isaac Laud, MS, ACSM-CEP, Exercise Physiologist;Annedrea Rosezella Florida, RN, MHA;Carlette Wilber Oliphant, RN, BSN;Lisa Ysidro Evert, RN;David Marcola, MS, ACSM-CEP, CCRP, Exercise Physiologist;Meredith Rosana Hoes RD, LDN    Virtual Visit No    Medication changes reported     No    Fall or balance concerns reported    No    Tobacco Cessation No Change    Warm-up and Cool-down Performed on first and last piece of equipment    Resistance Training Performed Yes    VAD Patient? No    PAD/SET Patient? No      Pain Assessment   Currently in Pain? No/denies    Pain Score 0-No pain    Multiple Pain Sites No           Capillary Blood Glucose: No results found for this or any previous visit (from the past 24 hour(s)).   Exercise Prescription Changes - 09/06/20 1200      Response to Exercise   Blood Pressure (Admit) 116/70    Blood Pressure (Exercise) 140/76    Blood Pressure (Exit) 122/80    Heart Rate (Admit) 63 bpm    Heart Rate (Exercise) 67 bpm    Heart Rate (Exit) 66 bpm    Oxygen Saturation (Admit) 95 %    Oxygen Saturation (Exercise) 91 %    Oxygen Saturation (Exit) 95 %    Rating of Perceived Exertion (Exercise) 12    Perceived Dyspnea (Exercise) 2    Duration Continue with 30 min of aerobic exercise without signs/symptoms of physical distress.    Intensity THRR unchanged      Progression   Progression Continue to progress  workloads to maintain intensity without signs/symptoms of physical distress.      Resistance Training   Training Prescription Yes    Weight orange bands    Reps 10-15    Time 10 Minutes      Recumbant Bike   Level 2    Minutes 15    METs 1.5      NuStep   Level 4    SPM 80    Minutes 15    METs 1.9           Social History   Tobacco Use  Smoking Status Never Smoker  Smokeless Tobacco Never Used    Goals Met:  Proper associated with RPD/PD & O2 Sat Independence with exercise equipment Exercise tolerated well No report of cardiac concerns or symptoms Strength training completed today  Goals Unmet:  Not Applicable  Comments: Service time is from 1015 to 1123    Dr. Fransico Him is Medical Director for Cardiac Rehab at [facility]

## 2020-09-08 ENCOUNTER — Other Ambulatory Visit: Payer: Self-pay

## 2020-09-08 ENCOUNTER — Encounter (HOSPITAL_COMMUNITY)
Admission: RE | Admit: 2020-09-08 | Discharge: 2020-09-08 | Disposition: A | Discharge status: Home / Self Care | Payer: Medicare PPO | Source: Ambulatory Visit | Attending: Emergency Medicine | Admitting: Emergency Medicine

## 2020-09-08 DIAGNOSIS — J986 Disorders of diaphragm: Secondary | ICD-10-CM

## 2020-09-08 DIAGNOSIS — J984 Other disorders of lung: Secondary | ICD-10-CM

## 2020-09-08 NOTE — Progress Notes (Signed)
Daily Session Note  Patient Details  Name: Whitney [name] MRN: [id number] Date of Birth: November 08, 1955 Referring Provider:   April Manson Pulmonary Rehab Walk Test from 07/19/2020 in [place]  Referring Provider Dr. Lamonte Sakai      Encounter Date: 09/08/2020  Check In:  Session Check In - 09/08/20 1058      Check-In   Supervising physician immediately available to respond to emergencies [facility] immediately available    Physician(s) Dr. Arbutus Ped    [facility]    Staff Present Rosebud Poles, RN, BSN;Lisa Ysidro Evert, RN;Jessica Hassell Done, MS, ACSM-CEP, Exercise Physiologist;Carlette Wilber Oliphant, RN, Milus Glazier, MS, ACSM-CEP, CCRP, Exercise Physiologist;Annedrea Rosezella Florida, RN, [place]    Virtual Visit No    Medication changes reported     No    Fall or balance concerns reported    No    Tobacco Cessation No Change    Warm-up and Cool-down Performed as group-led instruction    Resistance Training Performed Yes    VAD Patient? No    PAD/SET Patient? No      Pain Assessment   Currently in Pain? No/denies    Multiple Pain Sites No           Capillary Blood Glucose: No results found for this or any previous visit (from the past 24 hour(s)).    Social History   Tobacco Use  Smoking Status Never Smoker  Smokeless Tobacco Never Used    Goals Met:  Proper associated with RPD/PD & O2 Sat Exercise tolerated well Strength training completed today  Goals Unmet:  Not Applicable  Comments: Service time is from 1015 to 1120    Dr. Fransico Him is Medical Director for Cardiac Rehab at [facility].

## 2020-09-13 ENCOUNTER — Other Ambulatory Visit: Payer: Self-pay

## 2020-09-13 ENCOUNTER — Encounter (HOSPITAL_COMMUNITY)
Admission: RE | Admit: 2020-09-13 | Discharge: 2020-09-13 | Disposition: A | Discharge status: Home / Self Care | Payer: Medicare PPO | Source: Ambulatory Visit | Attending: Emergency Medicine | Admitting: Emergency Medicine

## 2020-09-13 DIAGNOSIS — J984 Other disorders of lung: Secondary | ICD-10-CM

## 2020-09-13 DIAGNOSIS — J986 Disorders of diaphragm: Secondary | ICD-10-CM | POA: Diagnosis not present

## 2020-09-13 NOTE — Progress Notes (Signed)
Daily Session Note  Patient Details  Name: Whitney [name] MRN: [id number] Date of Birth: 1955-09-08 Referring Provider:   April Manson Pulmonary Rehab Walk Test from 07/19/2020 in [place]  Referring Provider Dr. Lamonte Sakai      Encounter Date: 09/13/2020  Check In:  Session Check In - 09/13/20 1144      Check-In   Supervising physician immediately available to respond to emergencies [facility] immediately available    Physician(s) Dr. Arbutus Ped    [facility]    Staff Present Rosebud Poles, RN, Isaac Laud, MS, ACSM-CEP, Exercise Physiologist;Annedrea Rosezella [place], RN, MHA;Carlette Wilber Oliphant, RN, Milus Glazier, MS, ACSM-CEP, CCRP, Exercise Physiologist;Olinty Celesta Aver, MS, ACSM CEP, Exercise Physiologist    Virtual Visit No    Medication changes reported     No    Fall or balance concerns reported    No    Tobacco Cessation No Change    Warm-up and Cool-down Performed as group-led instruction    Resistance Training Performed Yes    VAD Patient? No    PAD/SET Patient? No      Pain Assessment   Currently in Pain? No/denies    Pain Score 0-No pain    Multiple Pain Sites No           Capillary Blood Glucose: No results found for this or any previous visit (from the past 24 hour(s)).    Social History   Tobacco Use  Smoking Status Never Smoker  Smokeless Tobacco Never Used    Goals Met:  Proper associated with RPD/PD & O2 Sat Independence with exercise equipment Exercise tolerated well No report of cardiac concerns or symptoms Strength training completed today  Goals Unmet:  Not Applicable  Comments: Service time is from 1015 to 1130    Dr. Fransico Him is Medical Director for Cardiac Rehab at [facility].

## 2020-09-14 ENCOUNTER — Ambulatory Visit: Payer: Medicare PPO | Admitting: Pulmonary Disease

## 2020-09-15 ENCOUNTER — Encounter (HOSPITAL_COMMUNITY): Payer: Medicare PPO

## 2020-09-19 ENCOUNTER — Telehealth: Payer: Self-pay | Admitting: Pulmonary Disease

## 2020-09-19 NOTE — Telephone Encounter (Signed)
Called and spoke with pt who stated at her last visit with [place], she was told that our office was not able to see her cpap data.  I tried to look up pt in [place] and was not able to pull pt up in there either. Tried checking [employer] and also was not able to pull pt up there.  Pt has an upcoming appt with our office 5/25 and she was trying to get it fixed to where we could see her cpap data prior to her coming in for the appt. Stated to pt that I was going to send community message to [employer] to see if they are able to get her enrolled into [employer] and stated to her that once this was fixed, we would let her know. Pt verbalized understanding.  Community message has been sent to [employer]. Will update once receive response.

## 2020-09-20 ENCOUNTER — Encounter (HOSPITAL_COMMUNITY)
Admission: RE | Admit: 2020-09-20 | Discharge: 2020-09-20 | Disposition: A | Discharge status: Home / Self Care | Payer: Medicare PPO | Source: Ambulatory Visit | Attending: Emergency Medicine | Admitting: Emergency Medicine

## 2020-09-20 ENCOUNTER — Other Ambulatory Visit: Payer: Self-pay

## 2020-09-20 DIAGNOSIS — J986 Disorders of diaphragm: Secondary | ICD-10-CM | POA: Diagnosis not present

## 2020-09-20 DIAGNOSIS — J984 Other disorders of lung: Secondary | ICD-10-CM | POA: Diagnosis not present

## 2020-09-20 NOTE — Telephone Encounter (Signed)
Community message received back from [employer] and is shown below: [place], [employer], Waldemar Dickens, [place]; Miquel Dunn; Skeet Latch; [place], [place],   I added you to have viewing rights in [facility]. Let me know if you need anything else or cant view. looks like patient is 93% last 28 days   Thank you,   [place] and we are able to pull up pt's cpap data. Called and spoke with pt letting her know that we were now able to see her cpap data in [place] and she verbalized understanding. Nothing further needed.

## 2020-09-20 NOTE — Progress Notes (Signed)
Daily Session Note  Patient Details  Name: Whitney [name] MRN: [id number] Date of Birth: 26-May-1955 Referring Provider:   April Manson Pulmonary Rehab Walk Test from 07/19/2020 in [place]  Referring Provider Dr. Lamonte Sakai      Encounter Date: 09/20/2020  Check In:  Session Check In - 09/20/20 1144      Check-In   Supervising physician immediately available to respond to emergencies [facility] immediately available    Physician(s) Dr. Teryl Lucy    Location [facility]    Staff Present Rosebud Poles, RN, Isaac Laud, MS, ACSM-CEP, Exercise Physiologist;Annedrea Rosezella [place], RN, Ramonita Lab, RN    Virtual Visit No    Medication changes reported     No    Fall or balance concerns reported    No    Tobacco Cessation No Change    Warm-up and Cool-down Performed as group-led instruction    Resistance Training Performed No    VAD Patient? No    PAD/SET Patient? No      Pain Assessment   Currently in Pain? No/denies    Multiple Pain Sites No           Capillary Blood Glucose: No results found for this or any previous visit (from the past 24 hour(s)).    Social History   Tobacco Use  Smoking Status Never Smoker  Smokeless Tobacco Never Used    Goals Met:  Proper associated with RPD/PD & O2 Sat Improved SOB with ADL's Exercise tolerated well No report of cardiac concerns or symptoms Patient completed pulmonary rehab program today  Goals Unmet:  Not Applicable  Comments: Service time is from 1015 to 1100 Patient completed pulmonary undergrad program and made improvements with 6 minute walk test.     Dr. Fransico Him is Medical Director for Cardiac Rehab at [facility].

## 2020-09-25 DIAGNOSIS — G4733 Obstructive sleep apnea (adult) (pediatric): Secondary | ICD-10-CM | POA: Diagnosis not present

## 2020-09-28 ENCOUNTER — Ambulatory Visit (INDEPENDENT_AMBULATORY_CARE_PROVIDER_SITE_OTHER): Payer: Medicare PPO | Admitting: Pulmonary Disease

## 2020-09-28 ENCOUNTER — Other Ambulatory Visit: Payer: Self-pay

## 2020-09-28 ENCOUNTER — Encounter: Payer: Self-pay | Admitting: Pulmonary Disease

## 2020-09-28 VITALS — BP 138/88 | HR 72 | Temp 98.3°F | Ht 64.0 in | Wt 222.2 lb

## 2020-09-28 DIAGNOSIS — Z9989 Dependence on other enabling machines and devices: Secondary | ICD-10-CM

## 2020-09-28 DIAGNOSIS — G4733 Obstructive sleep apnea (adult) (pediatric): Secondary | ICD-10-CM

## 2020-09-28 MED ORDER — ESZOPICLONE 1 MG PO TABS: 1.0000 mg | ORAL_TABLET | Freq: Every evening | ORAL | 3 refills | Status: DC | PRN

## 2020-09-28 NOTE — Progress Notes (Signed)
Whitney [name]    [id number]    08-06-1955  Primary Care Physician:Little, Lennette Bihari, MD  Referring Physician: Hulan Fess, MD [place],  [place] [address]  Chief complaint:   Obstructive sleep apnea on CPAP Sleep onset insomnia  HPI:  She continues to try to use CPAP on a regular basis CPAP seems to be helping a little bit however sometimes she feels the machine is not working well  A download on her did reveal 97% compliance Machine set at 5-20 95 percentile pressure of 10.2 AHI 1.7  Follows up regularly with Dr. Lamonte Sakai  Usually tries to go to bed between 8 and 830, might take about 3 hours before falling asleep-still takes a lot of time to fall asleep May wake up following initially falling asleep and stay up for a few hours, sometimes she plays games on the phone.  Once she falls back asleep she tries to get some hours of sleep before finally getting up in the morning Awakening time is not fixed  Has had more problems since she had bariatric surgery, she fell she sustained a paralyzed hemidiaphragm following that and her breathing has been challenging since then  She just graduated from pulmonary rehab and feels rehab definitely did help  No past history of smoking  Medical history significant for history of nonalcoholic steatohepatitis, Sjogren's, fibromyalgia, asthma prediabetic   Outpatient Encounter Medications as of 09/28/2020  Medication Sig  . amoxicillin (AMOXIL) 500 MG capsule Take 4 capsules 1 hour prior to any dental procedures  . cholecalciferol (D-VI-SOL) 10 MCG/ML LIQD Take 25 mcg by mouth daily.  . cyanocobalamin 1000 MCG tablet Take 1,000 mcg by mouth daily.  Marland Kitchen ezetimibe (ZETIA) 10 MG tablet Take 10 mg by mouth at bedtime.   . milk thistle 175 MG tablet Take 175 mg by mouth daily.  . nadolol (CORGARD) 20 MG tablet Take 20 mg by mouth daily.  Marland Kitchen PROAIR HFA 108 (90 Base) MCG/ACT inhaler INHALE 2 PUFFS INTO THE LUNGS EVERY 4 (FOUR)  HOURS AS NEEDED FOR WHEEZING OR SHORTNESS OF BREATH.  Marland Kitchen pyridOXINE (B-6) 50 MG tablet Take by mouth.  . TURMERIC PO Take by mouth.   No facility-administered encounter medications on file as of 09/28/2020.    Allergies as of 09/28/2020 - Review Complete 09/28/2020  Allergen Reaction Noted  . Doxycycline Hives and Rash 03/15/2011  . Naproxen Rash and Hives 03/15/2011    Past Medical History:  Diagnosis Date  . Allergic rhinitis   . Anemia    hx  . Arthritis   . Asthma    hx yrs ago  . Cirrhosis ([place]) last albumin 3.3 done at [facility] 06-16-2014 (under care everywhere tab in epic)   Secondary to Fatty liver --  followed by hepatology at [facility] (dr Gerald Dexter)   . Depression   . Diabetes mellitus type II    type 2 diet conrolled  . Dyspnea   . Fibromyalgia   . GERD (gastroesophageal reflux disease)   . Heart murmur    asymptomatic ---  1989 from rhuematic fever  . History of exercise stress test    05-05-2013---  negative bruce ETT given exercise workload,  no ischemia  . History of hiatal hernia   . History of kidney stones   . History of rheumatic fever    1989  . Hyperlipidemia   . Leukocytopenia   . Moderate aortic stenosis    AVA area 1.1cm2---  cardiologist --  dr Concepcion Living, 2014 in epic  . NASH (nonalcoholic steatohepatitis)   . OSA (obstructive sleep apnea)    was using CPAP before gastric sleeve 2015--  no uses after wt loss  . Pneumonia    hx  . Sjogren's syndrome ([place])   . Thrombocytopenia ([place])     Past Surgical History:  Procedure Laterality Date  . AORTIC VALVE REPLACEMENT N/A 12/05/2017   Procedure: AORTIC VALVE REPLACEMENT (AVR) TISSUE VALVE 21MM INSPIRIS;  Surgeon: Gaye Pollack, MD;  Location: [place];  Service: Open Heart Surgery;  Laterality: N/A;  . COLONOSCOPY WITH ESOPHAGOGASTRODUODENOSCOPY (EGD)    . CYSTOSCOPY WITH RETROGRADE PYELOGRAM, URETEROSCOPY AND STENT PLACEMENT Left 01/21/2015   Procedure: CYSTOSCOPY WITH LEFT  RETROGRADE PYELOGRAM, LEFT  URETEROSCOPY AND STENT PLACEMENT;  Surgeon: Festus Aloe, MD;  Location: [facility];  Service: Urology;  Laterality: Left;  . CYSTOSCOPY WITH RETROGRADE PYELOGRAM, URETEROSCOPY AND STENT PLACEMENT Bilateral 02/24/2015   Procedure: CYSTOSCOPY WITH RIGHT RETROGRADE PYELOGRAM, BLADDER BIOPSY FULGERATION LEFT URETEROSCOPY AND STENT REPLACEMENT;  Surgeon: Festus Aloe, MD;  Location: [facility];  Service: Urology;  Laterality: Bilateral;  . EXPLORATORY LAPAROSCOPY W/  CONE BIOPSY'S LEFT AND RIGHT LOBE OF LIVER  11-04-2007  . HOLMIUM LASER APPLICATION Left 27/51/7001   Procedure: HOLMIUM LASER LITHOTRIPSY;  Surgeon: Festus Aloe, MD;  Location: [facility];  Service: Urology;  Laterality: Left;  . HYSTEROSCOPY WITH D & C N/A 12/11/2012   Procedure: DILATATION AND CURETTAGE /HYSTEROSCOPY;  Surgeon: Maeola Sarah. Landry Mellow, MD;  Location: [facility];  Service: Gynecology;  Laterality: N/A;  . INGUINAL HERNIA REPAIR Left 10/22/2016   Procedure: LEFT INGUINAL HERNIA REPAIR;  Surgeon: Rolm Bookbinder, MD;  Location: [place];  Service: General;  Laterality: Left;  TAP BLOCK  . INSERTION OF MESH Left 10/22/2016   Procedure: INSERTION OF MESH;  Surgeon: Rolm Bookbinder, MD;  Location: [place];  Service: General;  Laterality: Left;  . LAPAROSCOPIC GASTRIC SLEEVE RESECTION  07-27-2013  . PUBOVAGINAL SLING  04-10-2001   [place]  . RIGHT/LEFT HEART CATH AND CORONARY ANGIOGRAPHY N/A 08/23/2017   Procedure: RIGHT/LEFT HEART CATH AND CORONARY ANGIOGRAPHY;  Surgeon: [name], MD;  Location: [place] [facility];  Service: Cardiovascular;  Laterality: N/A;  . TEE WITHOUT CARDIOVERSION N/A 12/05/2017   Procedure: TRANSESOPHAGEAL ECHOCARDIOGRAM (TEE);  Surgeon: Gaye Pollack, MD;  Location: [place];  Service: Open Heart Surgery;  Laterality: N/A;  . TONSILLECTOMY  1975  . TRANSTHORACIC ECHOCARDIOGRAM  06-04-2012  dr Irish Lack   mild LVH,  grade I diastolic dysfunction/  ef  60-65%/  moderate LAE/  mild MV calcifation without stenosis,  mild MR/  moderate AV stenosis,  cannot r/o bicupsid, area 1.1cm2/  mild dilated aortic root/  trivial TR    Family History  Problem Relation Age of Onset  . Alzheimer's disease Father   . Hip fracture Father   . Asthma Father   . Heart disease Father   . Heart attack Father   . Hypertension Father   . Rheum arthritis Mother   . Heart disease Mother   . Allergies Daughter   . Asthma Paternal Grandmother   . Asthma Daughter   . Stroke Neg Hx     Social History   Socioeconomic History  . Marital status: Widowed    Spouse name: Married to [employer]  . Number of children: Not on file  . Years of education: Not on file  . Highest  education level: Not on file  Occupational History  . Occupation: principal of [employer]  Tobacco Use  . Smoking status: Never Smoker  . Smokeless tobacco: Never Used  Vaping Use  . Vaping Use: Never used  Substance and Sexual Activity  . Alcohol use: Yes    Comment: rarely  . Drug use: No  . Sexual activity: Not on file  Other Topics Concern  . Not on file  Social History Narrative  . Not on file   Social Determinants of Health   Financial Resource Strain: Not on file  Food Insecurity: Not on file  Transportation Needs: Not on file  Physical Activity: Not on file  Stress: Not on file  Social Connections: Not on file  Intimate Partner Violence: Not on file    Review of Systems  Constitutional: Negative for fever.  HENT:       Nasal congestion  Respiratory: Positive for shortness of breath. Negative for chest tightness.   Cardiovascular: Negative for chest pain.  Psychiatric/Behavioral: Positive for sleep disturbance.    Vitals:   09/28/20 1346  BP: 138/88  Pulse: 72  Temp: 98.3 F (36.8 C)  SpO2: 94%     Physical Exam Constitutional:      Appearance: She is obese.  HENT:     Mouth/Throat:     Pharynx: No oropharyngeal exudate.  Eyes:     General:         Right eye: No discharge.        Left eye: No discharge.     Pupils: Pupils are equal, round, and reactive to light.  Cardiovascular:     Rate and Rhythm: Normal rate and regular rhythm.     Heart sounds: No murmur heard. No friction rub.  Pulmonary:     Effort: No respiratory distress.     Breath sounds: No stridor. No wheezing or rhonchi.     Comments: Clear breath sounds bilaterally Musculoskeletal:     Cervical back: No rigidity or tenderness.  Skin:    Findings: Rash:    Neurological:     Mental Status: She is alert.  Psychiatric:        Mood and Affect: Mood normal.    Data Reviewed: Sleep study from 09/01/2019 reviewed showing moderate obstructive sleep apnea  Compliance data as written above  Assessment:  Sleep onset and sleep maintenance insomnia -Tolerating CPAP okay -Encouraged to use CPAP nightly -Trial with Lunesta 1 mg to assist with getting used to CPAP and using CPAP on a regular basis  Moderate obstructive sleep apnea -Encouraged to continue CPAP   Rhinitis  Plan/Recommendations:  Encouraged to continue graded exercises  Lunesta 1 mg for insomnia  Encouraged to continue using CPAP on a nightly basis  I will see her back in 3 months    Sherrilyn Rist MD [facility] 09/28/2020, 1:52 PM  CC: Hulan Fess, MD

## 2020-10-17 ENCOUNTER — Telehealth: Payer: Self-pay | Admitting: Emergency Medicine

## 2020-10-17 ENCOUNTER — Telehealth: Payer: Self-pay | Admitting: Interventional Cardiology

## 2020-10-17 DIAGNOSIS — R002 Palpitations: Secondary | ICD-10-CM

## 2020-10-17 DIAGNOSIS — R0602 Shortness of breath: Secondary | ICD-10-CM

## 2020-10-17 NOTE — Telephone Encounter (Signed)
Spoke with pt in regards to SOB.  Pt reports SOB has occurred since she had open heart surgery in 2019.  She reports that her diaphragm was nicked during surgery.  She has noticed that SOB has increased over the years.  She also notes palpitations mainly at night while wearing CPAP.  She is wonders if she is experiencing anxiety from wearing CPAP. She is also having left clavicle pain that occurs mainly at night.  She thinks it may be related to fibromyalgia. She also reports that she feels lightheaded with double vision intermittently with activity.  She does not check her oxygen level but has a pulse ox.  I advised her to keep pulse ox around her neck and whenever she has symptoms to check O2 to ensure it is not dropping.  She reports that pulse ox are not accurate for people of color.  She also has an appointment with pulmonology on 6/15.  She would like for Dr. Irish Lack to tell her that she is not in Afib.  I informed her that last EKG showed NSR.  She did not have a recent BP at time of call. I will forward concerns to Dr. Irish Lack and his nurse for further advisement.

## 2020-10-17 NOTE — Telephone Encounter (Signed)
   Patient c/o Palpitations:  High priority if patient c/o lightheadedness, shortness of breath, or chest pain  How long have you had palpitations/irregular HR/ Afib? Are you having the symptoms now?  Palpitations -Since last June and July it seem to get getting worse's since she got the CPAP   Are you currently experiencing lightheadedness, SOB or CP? She is having some SOB when she is just sitting around . She stated there are times when she " is seeing double".  She stated not to the point of passing out.    Do you have a history of afib (atrial fibrillation) or irregular heart rhythm? She stated not that she knows of   Have you checked your BP or HR? (document readings if available): She is not sure what either one of those are  Her oxygen level would drop in the 80's in rehab about 3 weeks ago.    Are you experiencing any other symptoms? PT stated she is not sure if she is having anxiety from putting the mask on at night with CPAP   Best number [phone]

## 2020-10-17 NOTE — Telephone Encounter (Signed)
pt requested appt thru mychart but stated: My breathing and overall ability to function is getting beyond overwhelming. I need to know next step. I am worried sick how all this is affecting my heart and NASH issues. My quality of life r right now is a 3 on a scale of 1-10.  Please advise [phone]

## 2020-10-17 NOTE — Telephone Encounter (Signed)
Spoke with the pt  She states feeling discouraged b/c her breathing is not getting any better since her last visit  She sometimes gets winded walking just room to room at home  She states her breathing is no worse, just no better and not having any new symptoms  She is due for ov this month with RB  Appt scheduled for 10/19/20 at 9:15 am  Nothing further needed

## 2020-10-19 ENCOUNTER — Ambulatory Visit: Payer: Medicare PPO | Admitting: Emergency Medicine

## 2020-10-19 ENCOUNTER — Other Ambulatory Visit: Payer: Self-pay

## 2020-10-19 ENCOUNTER — Ambulatory Visit (INDEPENDENT_AMBULATORY_CARE_PROVIDER_SITE_OTHER): Payer: Medicare PPO

## 2020-10-19 ENCOUNTER — Encounter: Payer: Self-pay | Admitting: Emergency Medicine

## 2020-10-19 DIAGNOSIS — R002 Palpitations: Secondary | ICD-10-CM

## 2020-10-19 DIAGNOSIS — I480 Paroxysmal atrial fibrillation: Secondary | ICD-10-CM

## 2020-10-19 DIAGNOSIS — G4733 Obstructive sleep apnea (adult) (pediatric): Secondary | ICD-10-CM

## 2020-10-19 DIAGNOSIS — R053 Chronic cough: Secondary | ICD-10-CM | POA: Diagnosis not present

## 2020-10-19 DIAGNOSIS — R0602 Shortness of breath: Secondary | ICD-10-CM

## 2020-10-19 MED ORDER — STIOLTO RESPIMAT 2.5-2.5 MCG/ACT IN AERS: 2.0000 | INHALATION_SPRAY | Freq: Every day | RESPIRATORY_TRACT | 0 refills | Status: DC

## 2020-10-19 NOTE — Telephone Encounter (Signed)
[name], MD to Whitney Gilding, RN  Me      10:05 AM Home BP cuff may be able to indicate irregular rhythm.     Can give 2 week Zio patch if she is concerned that her palpitations are AFib   I spoke with patient.  She has not been checking her BP at home.  She would like to proceed with monitor.  Patient aware monitor will be mailed to her.  Will send instructions through my chart

## 2020-10-19 NOTE — Assessment & Plan Note (Signed)
With the impact from her chronic rhinitis.  She is using fluticasone nasal spray currently.

## 2020-10-19 NOTE — Patient Instructions (Addendum)
Congratulations on completing cardiopulmonary rehab.  You need to continue to work to build and maintain your your conditioning. We will arrange for a repeat 6-minute walk here in the office We will do a trial of Stiolto to see if this helps her overall breathing and functional capacity.  Take 2 puffs once daily every day on a schedule. Okay to keep your albuterol available use 2 puffs when needed for shortness of breath Continue your CPAP every night.  We will need to follow up with Dr. Ander Slade to ensure that you are getting maximum benefit from this. Consider starting the Lunesta if okay with your hepatologist. Follow with Dr. Lamonte Sakai in 2 months or sooner if you have any problems.

## 2020-10-19 NOTE — Assessment & Plan Note (Signed)
She appears to be compliant with her CPAP.  She does note some leak, but states that she tolerates the nasal pillows well.  She has anxiety regarding wearing it.  I think she needs to follow-up with Dr. Ander Slade to optimize.  She also has insomnia, has not yet started [facility] because she is concerned about hepatic side effects.  I asked her to talk to her hepatologist about this.

## 2020-10-19 NOTE — Addendum Note (Signed)
Addended by: Dessie Coma on: 10/19/2020 10:44 AM   Modules accepted: Orders

## 2020-10-19 NOTE — Progress Notes (Signed)
Subjective:    Patient ID: Whitney [name], female    DOB: Sep 17, 1955, 65 y.o.   MRN: [id number]  HPI  ROV 10/19/20 --65 year old woman with multifactorial dyspnea.  This in the setting of restrictive lung disease with an elevated hemidiaphragm, mild obstruction on PFTs, possible secondary pulmonary hypertension.  A CPEX showed that the restrictive disease was probably the main driver as well as deconditioning.  She has a history of Whitney [name] cirrhosis, aortic stenosis with an AVR, Sjogren's, obstructive sleep apnea for which she is seeing Whitney [name].  We tried albuterol to use intermittently to see if it will help her dyspnea, unclear whether she has really responded.  Today she reports that she doesn't feel very different than last time. That said she was able to complete the 6 minute walk at the end of the course, was unable at the initiation. She has days when she cannot exert at all, then on other days she can - she is unsure what may be influencing the difference. She has fatigue, feels depressed. She states that she has anxiety at night - ? Regarding the CPAP. She was prescribed Lunesta, but has not started due to concerns about hepatic side effects. She does still sometimes use albuterol. She is at times tearful today. She has a dry cough - worse recently, non-productive. She uses flonase qhs starting 1 month ago.   She is wearing CPAP every night, but she is concerned that it may not be working well, may have leak. She is sleeping 4-5 h a night.   She has done Pulm Rehab for her restrictive disease, walk test 09/20/20, completed program   Review of Systems As per HPI      Objective:   Physical Exam Vitals:   10/19/20 0926  BP: 138/80  Pulse: 63  Temp: 98 F (36.7 C)  TempSrc: Temporal  SpO2: 95%  Weight: 220 lb 12.8 oz (100.2 kg)  Height: 5' 4"  (1.626 m)    Gen: Pleasant, overwt woman, in no distress,  somewhat depressed affect  ENT: No lesions,  mouth clear,  oropharynx clear,  no postnasal drip  Neck: No JVD, no stridor  Lungs: No use of accessory muscles, clear, better BS on the L than on the R  Cardiovascular: RRR, late systolic M with intact S1 and S2  Musculoskeletal: No deformities, no cyanosis or clubbing  Neuro: alert, awake, non focal  Skin: Warm, no lesions or rash      Assessment & Plan:  Dyspnea Multifactorial dyspnea.  She has autoimmune disease but no evidence for ILD or significant PAH.  I believe that dyspnea is principally due to deconditioning, restrictive lung disease from an elevated hemidiaphragm and her weight.  She probably did benefit some from cardiopulmonary rehab.  Her decreased functional capacity is contributing to depression which I think is also limiting her.  She may have had some desaturations with exertion for cardiopulmonary rehab, I do not see these documented in her most recent 6-minute walk.  We will repeat this here and arrange for oxygen if she requires it.  She may get some marginal improvement with albuterol, does have some subtle evidence for obstruction on PFT so I think it is reasonable to do a trial of Stiolto to see if this helps her overall functional capacity. She does have cardiac disease that appears to be well compensated.  She has had some possible palpitations and this is being evaluated currently by Whitney [name].  There may be overlap here  between her sleep issues, fatigue and overall functional capacity as well.  We will need to follow-up with Whitney [name] to determine whether her CPAP is optimized.  He wanted her to start Lunesta but she is concerned about this given her history of hepatic disease.  I have asked her to discuss this with her hepatologist.  Chronic cough With the impact from her chronic rhinitis.  She is using fluticasone nasal spray currently.  OSA (obstructive sleep apnea) She appears to be compliant with her CPAP.  She does note some leak, but states that she tolerates the nasal pillows  well.  She has anxiety regarding wearing it.  I think she needs to follow-up with Whitney [name] to optimize.  She also has insomnia, has not yet started [facility] because she is concerned about hepatic side effects.  I asked her to talk to her hepatologist about this.  Paroxysmal atrial fibrillation (HCC) She has been concerned about possible palpitations and is being evaluated by cardiology.  She is in sinus rhythm here today.  Time spent reviewing chart, data, examination, discussing issues with patient, counseling is 51 minutes  Baltazar Apo, MD, PhD 10/19/2020, 10:02 AM [facility] [phone] or if no answer [phone]

## 2020-10-19 NOTE — Assessment & Plan Note (Addendum)
Multifactorial dyspnea.  She has autoimmune disease but no evidence for ILD or significant PAH.  I believe that dyspnea is principally due to deconditioning, restrictive lung disease from an elevated hemidiaphragm and her weight.  She probably did benefit some from cardiopulmonary rehab.  Her decreased functional capacity is contributing to depression which I think is also limiting her.  She may have had some desaturations with exertion for cardiopulmonary rehab, I do not see these documented in her most recent 6-minute walk.  We will repeat this here and arrange for oxygen if she requires it.  She may get some marginal improvement with albuterol, does have some subtle evidence for obstruction on PFT so I think it is reasonable to do a trial of Stiolto to see if this helps her overall functional capacity. She does have cardiac disease that appears to be well compensated.  She has had some possible palpitations and this is being evaluated currently by Dr. Irish Lack.  There may be overlap here between her sleep issues, fatigue and overall functional capacity as well.  We will need to follow-up with Dr. Ander Slade to determine whether her CPAP is optimized.  He wanted her to start Lunesta but she is concerned about this given her history of hepatic disease.  I have asked her to discuss this with her hepatologist.

## 2020-10-19 NOTE — Assessment & Plan Note (Signed)
She has been concerned about possible palpitations and is being evaluated by cardiology.  She is in sinus rhythm here today.

## 2020-10-19 NOTE — Telephone Encounter (Signed)
Left message to call office

## 2020-10-19 NOTE — Telephone Encounter (Signed)
Patient is returning call.  °

## 2020-10-19 NOTE — Progress Notes (Unsigned)
Enrolled patient for a 14 day Zio XT Monitor to be mailed to patients home  

## 2020-10-21 ENCOUNTER — Ambulatory Visit (INDEPENDENT_AMBULATORY_CARE_PROVIDER_SITE_OTHER): Payer: Medicare PPO

## 2020-10-21 ENCOUNTER — Other Ambulatory Visit: Payer: Self-pay

## 2020-10-21 DIAGNOSIS — R053 Chronic cough: Secondary | ICD-10-CM

## 2020-10-21 DIAGNOSIS — R0602 Shortness of breath: Secondary | ICD-10-CM | POA: Diagnosis not present

## 2020-10-21 NOTE — Progress Notes (Signed)
Six Minute Walk - 10/21/20 1516       Six Minute Walk   Medications taken before test (dose and time) Zetia at 6:30 am, Stiolto at 9:30 am    Supplemental oxygen during test? No    Lap distance in meters  34 meters    Laps Completed 9    Partial lap (in meters) 1 meters    Baseline BP (sitting) 140/80    Baseline Heartrate 67    Baseline Dyspnea (Borg Scale) 3    Baseline Fatigue (Borg Scale) 2    Baseline SPO2 96 %      End of Test Values    BP (sitting) 130/80    Heartrate 84    Dyspnea (Borg Scale) 6    Fatigue (Borg Scale) 3.5    SPO2 93 %      2 Minutes Post Walk Values   BP (sitting) 132/80    Heartrate 70    SPO2 98 %    Stopped or paused before six minutes? No    Other Symptoms at end of exercise: Dizziness   Lightheaded     Interpretation   Distance completed 307 meters    Tech Comments: Patient walked average pace and was able to complete 6 minute walk without stopping. Monitored her oxygen per patient request and she did not go below 90% the entire time.

## 2020-10-25 NOTE — Addendum Note (Signed)
Encounter addended by: Lance Morin, RN on: 10/25/2020 3:21 PM  Actions taken: Clinical Note Signed, Episode resolved

## 2020-10-25 NOTE — Addendum Note (Signed)
Encounter addended by: George Ina, RD on: 10/25/2020 2:55 PM  Actions taken: Flowsheet accepted

## 2020-10-25 NOTE — Progress Notes (Signed)
Discharge Progress Report  Patient Details  Name: Whitney [name] MRN: [id number] Date of Birth: 24-Dec-1955 Referring Provider:   [facility] Walk Test from 07/19/2020 in [place]  Referring Provider Dr. Lamonte Sakai        Number of Visits: 15  Reason for Discharge:  Patient reached a stable level of exercise. Patient independent in their exercise. Patient has met program and personal goals.  Smoking History:  Social History   Tobacco Use  Smoking Status Never  Smokeless Tobacco Never    Diagnosis:  Restrictive lung disease  Elevated hemidiaphragm  ADL [facility]:  Pulmonary Assessment Scores     Row Name 07/11/20 1644 07/19/20 1628 09/20/20 1538     ADL [facility]   ADL Phase Entry -- Exit   SOB Score total 78 -- --     CAT Score   CAT Score 36 -- --     mMRC Score   mMRC Score -- 4 4            Initial Exercise Prescription:  Initial Exercise Prescription - 07/19/20 1200       Date of Initial Exercise RX and Referring Provider   Date 07/19/20    Referring Provider Dr. Lamonte Sakai    Expected Discharge Date 09/20/20      NuStep   Level 2    SPM 80    Minutes 15      Track   Minutes 15      Prescription Details   Frequency (times per week) 2    Duration Progress to 30 minutes of continuous aerobic without signs/symptoms of physical distress      Intensity   THRR 40-80% of Max Heartrate 62-123    Ratings of Perceived Exertion 11-13    Perceived Dyspnea 0-4      Progression   Progression Continue to progress workloads to maintain intensity without signs/symptoms of physical distress.      Resistance Training   Training Prescription Yes    Weight orange bands    Reps 10-15             Discharge Exercise Prescription (Final Exercise Prescription Changes):  Exercise Prescription Changes - 09/06/20 1200       Response to Exercise   Blood Pressure (Admit) 116/70    Blood Pressure (Exercise) 140/76     Blood Pressure (Exit) 122/80    Heart Rate (Admit) 63 bpm    Heart Rate (Exercise) 67 bpm    Heart Rate (Exit) 66 bpm    Oxygen Saturation (Admit) 95 %    Oxygen Saturation (Exercise) 91 %    Oxygen Saturation (Exit) 95 %    Rating of Perceived Exertion (Exercise) 12    Perceived Dyspnea (Exercise) 2    Duration Continue with 30 min of aerobic exercise without signs/symptoms of physical distress.    Intensity THRR unchanged      Progression   Progression Continue to progress workloads to maintain intensity without signs/symptoms of physical distress.      Resistance Training   Training Prescription Yes    Weight orange bands    Reps 10-15    Time 10 Minutes      Recumbant Bike   Level 2    Minutes 15    METs 1.5      NuStep   Level 4    SPM 80    Minutes 15    METs 1.9  Functional Capacity:  6 Minute Walk     Row Name 07/19/20 1201 09/20/20 1533       6 Minute Walk   Phase Initial Discharge    Distance 800 feet 1039 feet    Distance % Change -- 29.87 %    Distance Feet Change -- 239 ft    Walk Time 6 minutes 6 minutes    # of Rest Breaks 0 0    MPH 1.52 1.97    METS 1.67 2.12    RPE 14 13    Perceived Dyspnea  3 3    VO2 Peak 5.84 7.41    Symptoms Yes (comment) Yes (comment)    Comments Patients oxygen saturation dropped to 84% on RA. Thought this may have been due to her cold hands. Had her stop at minute 4 to perform pursed lip breathing and check pulse ox. Oxygen saturation came back up once I warmed her fingers a little. I think the 84% was an error. We will continue to monitor oxygen saturation and supplemental oxygen needs. Oxygen saturation dropped to 83% at minute 5, had her stop and perform pursed lip breathing and oxygen saturation immediately came back up. Pt did have shortness of breath but no other symptoms.    Resting HR 61 bpm 67 bpm    Resting BP 148/80 136/70    Resting Oxygen Saturation  95 % 96 %    Exercise Oxygen Saturation   during 6 min walk 84 % 83 %    Max Ex. HR 85 bpm 92 bpm    Max Ex. BP 160/84 160/82    2 Minute Post BP 146/80 140/82         Interval HR      1 Minute HR 74 70    2 Minute HR 82 74    3 Minute HR 85 84    4 Minute HR 68  This was when I had her stop to perform pursed lip breathing 88    5 Minute HR 75 92    6 Minute HR 65 87    2 Minute Post HR -- 67    Interval Heart Rate? Yes Yes         Interval Oxygen      Interval Oxygen? Yes Yes    Baseline Oxygen Saturation % 95 % 96 %    1 Minute Oxygen Saturation % 94 % 94 %    1 Minute Liters of Oxygen 0 L 0 L    2 Minute Oxygen Saturation % 87 % 91 %    2 Minute Liters of Oxygen 0 L 0 L    3 Minute Oxygen Saturation % 85 % 88 %    3 Minute Liters of Oxygen 0 L 0 L    4 Minute Oxygen Saturation % 84 %  Stopped and performed pursed lip breathing 85 %    4 Minute Liters of Oxygen 0 L 0 L    5 Minute Oxygen Saturation % 87 % 83 %    5 Minute Liters of Oxygen 0 L 0 L    6 Minute Oxygen Saturation % 92 % 84 %    6 Minute Liters of Oxygen 0 L 0 L    2 Minute Post Oxygen Saturation % 95 % 94 %    2 Minute Post Liters of Oxygen 0 L 0 L            Psychological, QOL, Others - Outcomes: PHQ 2/9:  Depression screen [facility] 2/9 09/20/2020 07/11/2020 03/11/2019 03/11/2019 04/28/2018  Decreased Interest 1 1 1 1  0  Down, Depressed, Hopeless 0 1 1 1  0  PHQ - 2 Score 1 2 2 2  0  Altered sleeping 1 1 1 1  -  Tired, decreased energy 2 2 1 1  -  Change in appetite 0 1 0 0 -  Feeling bad or failure about yourself  0 0 0 0 -  Trouble concentrating 0 0 0 0 -  Moving slowly or fidgety/restless 0 0 0 0 -  Suicidal thoughts 0 0 0 0 -  PHQ-9 Score 4 6 4 4  -  Difficult doing work/chores Somewhat difficult Somewhat difficult Not difficult at all - -  Some recent data might be hidden    Quality of Life:   Personal Goals: Goals established at orientation with interventions provided to work toward goal.  Personal Goals and Risk Factors at Admission -  07/11/20 1615       Core Components/Risk Factors/Patient Goals on Admission    Weight Management Weight Loss    Intervention Weight Management: Develop a combined nutrition and exercise program designed to reach desired caloric intake, while maintaining appropriate intake of nutrient and fiber, sodium and fats, and appropriate energy expenditure required for the weight goal.;Obesity: Provide education and appropriate resources to help participant work on and attain dietary goals.;Weight Management/Obesity: Establish reasonable short term and long term weight goals.    Admit Weight 221 lb (100.2 kg)    Goal Weight: Short Term 200 lb (90.7 kg)    Goal Weight: Long Term 180 lb (81.6 kg)    Expected Outcomes Short Term: Continue to assess and modify interventions until short term weight is achieved;Long Term: Adherence to nutrition and physical activity/exercise program aimed toward attainment of established weight goal;Weight Loss: Understanding of general recommendations for a balanced deficit meal plan, which promotes 1-2 lb weight loss per week and includes a negative energy balance of [phone] kcal/d    Improve shortness of breath with ADL's Yes    Intervention Provide education, individualized exercise plan and daily activity instruction to help decrease symptoms of SOB with activities of daily living.    Expected Outcomes Short Term: Improve cardiorespiratory fitness to achieve a reduction of symptoms when performing ADLs;Long Term: Be able to perform more ADLs without symptoms or delay the onset of symptoms    Diabetes --   Pre-diabetes   Hypertension Yes    Intervention Provide education on lifestyle modifcations including regular physical activity/exercise, weight management, moderate sodium restriction and increased consumption of fresh fruit, vegetables, and low fat dairy, alcohol moderation, and smoking cessation.;Monitor prescription use compliance.    Expected Outcomes Long Term:  Maintenance of blood pressure at goal levels.              Personal Goals Discharge:  Goals and Risk Factor Review     Row Name 08/01/20 1125 08/23/20 0831 09/19/20 1107         Core Components/Risk Factors/Patient Goals Review   Personal Goals Review Develop more efficient breathing techniques such as purse lipped breathing and diaphragmatic breathing and practicing self-pacing with activity.;Increase knowledge of respiratory medications and ability to use respiratory devices properly.;Improve shortness of breath with ADL's Develop more efficient breathing techniques such as purse lipped breathing and diaphragmatic breathing and practicing self-pacing with activity.;Increase knowledge of respiratory medications and ability to use respiratory devices properly.;Improve shortness of breath with ADL's Develop more efficient breathing techniques such as purse lipped breathing  and diaphragmatic breathing and practicing self-pacing with activity.;Increase knowledge of respiratory medications and ability to use respiratory devices properly.;Improve shortness of breath with ADL's     Review [name] just started the program, has exercised 3 times, too early to see progression towards goals of improving shortness of breath. 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  So far she has not required supplemental oxygen to exercise as long as she is deep breathing. [name] graduates this week, she is limited by her paralyzed diaphragm, and experiences much shortness of breath with any activity.     Expected Outcomes See admission goals. For [name] to improve her deconditioning, stamina, and shortness of breath  and to exercise independently. See admission goals.              Exercise Goals and Review:  Exercise Goals     Row Name 07/11/20 1612             Exercise Goals   Increase Physical Activity Yes       Intervention Provide advice,  education, support and counseling about physical activity/exercise needs.;Develop an individualized exercise prescription for aerobic and resistive training based on initial evaluation findings, risk stratification, comorbidities and participant's personal goals.       Expected Outcomes Short Term: Attend rehab on a regular basis to increase amount of physical activity.;Long Term: Add in home exercise to make exercise part of routine and to increase amount of physical activity.;Long Term: Exercising regularly at least 3-5 days a week.       Increase Strength and Stamina Yes       Intervention Provide advice, education, support and counseling about physical activity/exercise needs.;Develop an individualized exercise prescription for aerobic and resistive training based on initial evaluation findings, risk stratification, comorbidities and participant's personal goals.       Expected Outcomes Short Term: Increase workloads from initial exercise prescription for resistance, speed, and METs.;Short Term: Perform resistance training exercises routinely during rehab and add in resistance training at home;Long Term: Improve cardiorespiratory fitness, muscular endurance and strength as measured by increased METs and functional capacity (6MWT)       Able to understand and use rate of perceived exertion (RPE) scale Yes       Intervention Provide education and explanation on how to use RPE scale       Expected Outcomes Short Term: Able to use RPE daily in rehab to express subjective intensity level;Long Term:  Able to use RPE to guide intensity level when exercising independently       Able to understand and use Dyspnea scale Yes       Intervention Provide education and explanation on how to use Dyspnea scale       Expected Outcomes Short Term: Able to use Dyspnea scale daily in rehab to express subjective sense of shortness of breath during exertion;Long Term: Able to use Dyspnea scale to guide intensity level when  exercising independently       Knowledge and understanding of Target Heart Rate Range (THRR) Yes       Intervention Provide education and explanation of THRR including how the numbers were predicted and where they are located for reference       Expected Outcomes Short Term: Able to state/look up THRR;Long Term: Able to use THRR to govern intensity when exercising independently;Short Term: Able to use daily as guideline for intensity in rehab       Understanding  of Exercise Prescription Yes       Intervention Provide education, explanation, and written materials on patient's individual exercise prescription       Expected Outcomes Short Term: Able to explain program exercise prescription;Long Term: Able to explain home exercise prescription to exercise independently                Exercise Goals Re-Evaluation:  Exercise Goals Re-Evaluation     Row Name 08/02/20 0742 08/26/20 1443           Exercise Goal Re-Evaluation   Exercise Goals Review Increase Physical Activity;Increase Strength and Stamina;Able to understand and use rate of perceived exertion (RPE) scale;Able to understand and use Dyspnea scale;Knowledge and understanding of Target Heart Rate Range (THRR);Understanding of Exercise Prescription Increase Physical Activity;Increase Strength and Stamina;Able to understand and use rate of perceived exertion (RPE) scale;Able to understand and use Dyspnea scale;Knowledge and understanding of Target Heart Rate Range (THRR);Understanding of Exercise Prescription      Comments Pt has completed 3 exercise sessions. It is too early to see much progression. The first session we had her on the Nustep for 15 minutes and walking the track for 15 minutes. Pt stated that she was not going to walk the track because she did not feel comfortable doing so. Her new exercise prescription is 15 minutes on the Nustep and 15 minutes on the Recumbent bike. So far she has tolerated this well. She is exercising at  1.9 METS on the Nustep and 1.5 METS on the Bike. Will continue to monitor and progress as she is able. [name] has completed 10 exercise sessions and has been a little slow with workload and MET level increases. She does not seem very motivated to exercise, but she has participated in cardiac and pulmonary rehab before. We recently discussed home exercise and she is not doing any from of exercise outside of rehab so we encouraged exercise at least 2 days at home. Pt said she would try, so we will continue to follow up on that. Currently she is exercising at 2.0 METS on the Nustep and 1.4 METS on the bike. Will continue to monitor and progress as she is able.      Expected Outcomes Through exercise at rehab and at home, the patient will decrease shortness of breath with daily activities and feel confident in carrying out an exercise regime at home. Through exercise at rehab and at home, the patient will decrease shortness of breath with daily activities and feel confident in carrying out an exercise regime at home. Pt to start home exercise now that she is a month into the program.               Nutrition & Weight - Outcomes:  Pre Biometrics - 07/19/20 1201       Pre Biometrics   Grip Strength 23 kg             Post Biometrics - 09/20/20 1538        Post  Biometrics   Grip Strength 22 kg             Nutrition:  Nutrition Therapy & Goals - 08/03/20 1501       Nutrition Therapy   Diet Therapeutic Lifestyle Changes      Personal Nutrition Goals   Nutrition Goal Pt to identify food quantities necessary to achieve weight loss of 6-24 lb at graduation from pulmonary rehab.    Personal Goal #2 Pt to build a healthy  plate including vegetables, fruits, whole grains, and low-fat dairy products in a heart healthy meal plan.    Personal Goal #3 Pt to eat 80-90 g protein and >32 oz water daily      Intervention Plan   Intervention Prescribe, educate and counsel regarding individualized  specific dietary modifications aiming towards targeted core components such as weight, hypertension, lipid management, diabetes, heart failure and other comorbidities.    Expected Outcomes Short Term Goal: Understand basic principles of dietary content, such as calories, fat, sodium, cholesterol and nutrients.;Long Term Goal: Adherence to prescribed nutrition plan.             Nutrition Discharge:   Education Questionnaire Score:  Knowledge Questionnaire Score - 07/11/20 1644       Knowledge Questionnaire Score   Pre Score 17/18             Goals reviewed with patient; copy given to patient.

## 2020-10-26 DIAGNOSIS — G4733 Obstructive sleep apnea (adult) (pediatric): Secondary | ICD-10-CM | POA: Diagnosis not present

## 2020-10-27 DIAGNOSIS — R002 Palpitations: Secondary | ICD-10-CM

## 2020-10-27 DIAGNOSIS — R0602 Shortness of breath: Secondary | ICD-10-CM | POA: Diagnosis not present

## 2020-11-16 DIAGNOSIS — R0602 Shortness of breath: Secondary | ICD-10-CM | POA: Diagnosis not present

## 2020-11-16 DIAGNOSIS — R002 Palpitations: Secondary | ICD-10-CM | POA: Diagnosis not present

## 2020-11-17 ENCOUNTER — Other Ambulatory Visit: Payer: Self-pay

## 2020-11-19 ENCOUNTER — Other Ambulatory Visit: Payer: Self-pay

## 2020-11-21 NOTE — Telephone Encounter (Signed)
Asked pt to verifiy pharmacy and pended Stiolto order per Dr. Agustina Caroli instruction.

## 2020-11-22 ENCOUNTER — Other Ambulatory Visit: Payer: Self-pay

## 2020-11-22 ENCOUNTER — Telehealth: Payer: Self-pay | Admitting: Emergency Medicine

## 2020-11-22 ENCOUNTER — Ambulatory Visit
Admission: RE | Admit: 2020-11-22 | Discharge: 2020-11-22 | Disposition: A | Discharge status: Home / Self Care | Payer: Medicare PPO | Source: Ambulatory Visit | Attending: Family Medicine | Admitting: Family Medicine

## 2020-11-22 DIAGNOSIS — Z1231 Encounter for screening mammogram for malignant neoplasm of breast: Secondary | ICD-10-CM | POA: Diagnosis not present

## 2020-11-22 DIAGNOSIS — Z780 Asymptomatic menopausal state: Secondary | ICD-10-CM | POA: Diagnosis not present

## 2020-11-22 DIAGNOSIS — E2839 Other primary ovarian failure: Secondary | ICD-10-CM

## 2020-11-22 MED ORDER — STIOLTO RESPIMAT 2.5-2.5 MCG/ACT IN AERS: 2.0000 | INHALATION_SPRAY | Freq: Every day | RESPIRATORY_TRACT | 1 refills | Status: DC

## 2020-11-22 NOTE — Telephone Encounter (Signed)
Previously refilled.

## 2020-11-22 NOTE — Telephone Encounter (Signed)
Refill request for [facility] sent. Patient notified. Nothing further needed.

## 2020-11-23 ENCOUNTER — Other Ambulatory Visit: Payer: Self-pay | Admitting: Family Medicine

## 2020-11-23 DIAGNOSIS — R921 Mammographic calcification found on diagnostic imaging of breast: Secondary | ICD-10-CM

## 2020-11-25 DIAGNOSIS — G4733 Obstructive sleep apnea (adult) (pediatric): Secondary | ICD-10-CM | POA: Diagnosis not present

## 2020-11-29 DIAGNOSIS — D696 Thrombocytopenia, unspecified: Secondary | ICD-10-CM | POA: Diagnosis not present

## 2020-11-29 DIAGNOSIS — E559 Vitamin D deficiency, unspecified: Secondary | ICD-10-CM | POA: Diagnosis not present

## 2020-11-29 DIAGNOSIS — K760 Fatty (change of) liver, not elsewhere classified: Secondary | ICD-10-CM | POA: Diagnosis not present

## 2020-11-29 DIAGNOSIS — E7800 Pure hypercholesterolemia, unspecified: Secondary | ICD-10-CM | POA: Diagnosis not present

## 2020-11-29 DIAGNOSIS — R252 Cramp and spasm: Secondary | ICD-10-CM | POA: Diagnosis not present

## 2020-11-29 DIAGNOSIS — E1342 Other specified diabetes mellitus with diabetic polyneuropathy: Secondary | ICD-10-CM | POA: Diagnosis not present

## 2020-11-30 DIAGNOSIS — E559 Vitamin D deficiency, unspecified: Secondary | ICD-10-CM | POA: Diagnosis not present

## 2020-11-30 DIAGNOSIS — E7800 Pure hypercholesterolemia, unspecified: Secondary | ICD-10-CM | POA: Diagnosis not present

## 2020-11-30 DIAGNOSIS — R252 Cramp and spasm: Secondary | ICD-10-CM | POA: Diagnosis not present

## 2020-11-30 DIAGNOSIS — E1342 Other specified diabetes mellitus with diabetic polyneuropathy: Secondary | ICD-10-CM | POA: Diagnosis not present

## 2020-11-30 DIAGNOSIS — K760 Fatty (change of) liver, not elsewhere classified: Secondary | ICD-10-CM | POA: Diagnosis not present

## 2020-11-30 DIAGNOSIS — D696 Thrombocytopenia, unspecified: Secondary | ICD-10-CM | POA: Diagnosis not present

## 2020-12-01 DIAGNOSIS — M3500 Sicca syndrome, unspecified: Secondary | ICD-10-CM | POA: Diagnosis not present

## 2020-12-16 DIAGNOSIS — G4733 Obstructive sleep apnea (adult) (pediatric): Secondary | ICD-10-CM | POA: Diagnosis not present

## 2020-12-19 DIAGNOSIS — H2513 Age-related nuclear cataract, bilateral: Secondary | ICD-10-CM | POA: Diagnosis not present

## 2020-12-19 DIAGNOSIS — E1165 Type 2 diabetes mellitus with hyperglycemia: Secondary | ICD-10-CM | POA: Diagnosis not present

## 2020-12-19 DIAGNOSIS — H04123 Dry eye syndrome of bilateral lacrimal glands: Secondary | ICD-10-CM | POA: Diagnosis not present

## 2020-12-19 DIAGNOSIS — H16223 Keratoconjunctivitis sicca, not specified as Sjogren's, bilateral: Secondary | ICD-10-CM | POA: Diagnosis not present

## 2020-12-20 ENCOUNTER — Ambulatory Visit: Payer: Medicare PPO | Admitting: Emergency Medicine

## 2020-12-28 ENCOUNTER — Other Ambulatory Visit: Payer: Self-pay

## 2020-12-28 ENCOUNTER — Ambulatory Visit
Admission: RE | Admit: 2020-12-28 | Discharge: 2020-12-28 | Disposition: A | Discharge status: Home / Self Care | Payer: Medicare PPO | Source: Ambulatory Visit | Attending: Family Medicine | Admitting: Family Medicine

## 2020-12-28 ENCOUNTER — Other Ambulatory Visit: Payer: Medicare PPO

## 2020-12-28 DIAGNOSIS — R928 Other abnormal and inconclusive findings on diagnostic imaging of breast: Secondary | ICD-10-CM | POA: Diagnosis not present

## 2020-12-28 DIAGNOSIS — R921 Mammographic calcification found on diagnostic imaging of breast: Secondary | ICD-10-CM | POA: Diagnosis not present

## 2020-12-28 DIAGNOSIS — R922 Inconclusive mammogram: Secondary | ICD-10-CM | POA: Diagnosis not present

## 2021-01-03 DIAGNOSIS — K7581 Nonalcoholic steatohepatitis (NASH): Secondary | ICD-10-CM | POA: Diagnosis not present

## 2021-01-03 DIAGNOSIS — G629 Polyneuropathy, unspecified: Secondary | ICD-10-CM | POA: Diagnosis not present

## 2021-01-03 DIAGNOSIS — R0602 Shortness of breath: Secondary | ICD-10-CM | POA: Diagnosis not present

## 2021-01-03 DIAGNOSIS — K7460 Unspecified cirrhosis of liver: Secondary | ICD-10-CM | POA: Diagnosis not present

## 2021-01-03 DIAGNOSIS — Z952 Presence of prosthetic heart valve: Secondary | ICD-10-CM | POA: Diagnosis not present

## 2021-01-03 DIAGNOSIS — Z9989 Dependence on other enabling machines and devices: Secondary | ICD-10-CM | POA: Diagnosis not present

## 2021-01-03 DIAGNOSIS — Z6838 Body mass index (BMI) 38.0-38.9, adult: Secondary | ICD-10-CM | POA: Diagnosis not present

## 2021-01-03 DIAGNOSIS — E669 Obesity, unspecified: Secondary | ICD-10-CM | POA: Diagnosis not present

## 2021-01-11 ENCOUNTER — Other Ambulatory Visit: Payer: Self-pay | Admitting: Emergency Medicine

## 2021-01-18 ENCOUNTER — Encounter: Payer: Self-pay | Admitting: Emergency Medicine

## 2021-01-18 ENCOUNTER — Other Ambulatory Visit: Payer: Self-pay

## 2021-01-18 ENCOUNTER — Ambulatory Visit: Payer: Medicare PPO | Admitting: Emergency Medicine

## 2021-01-18 DIAGNOSIS — R0602 Shortness of breath: Secondary | ICD-10-CM | POA: Diagnosis not present

## 2021-01-18 DIAGNOSIS — G4733 Obstructive sleep apnea (adult) (pediatric): Secondary | ICD-10-CM

## 2021-01-18 DIAGNOSIS — J449 Chronic obstructive pulmonary disease, unspecified: Secondary | ICD-10-CM | POA: Diagnosis not present

## 2021-01-18 NOTE — Progress Notes (Signed)
Subjective:    Patient ID: Whitney [name], female    DOB: March 28, 1956, 65 y.o.   MRN: [id number]  HPI  ROV 10/19/20 --65 year old woman with multifactorial dyspnea.  This in the setting of restrictive lung disease with an elevated hemidiaphragm, mild obstruction on PFTs, possible secondary pulmonary hypertension.  A CPEX showed that the restrictive disease was probably the main driver as well as deconditioning.  She has a history of Whitney [name] cirrhosis, aortic stenosis with an AVR, Sjogren's, obstructive sleep apnea for which she is seeing Dr. Ander [name].  We tried albuterol to use intermittently to see if it will help her dyspnea, unclear whether she has really responded.  Today she reports that she doesn't feel very different than last time. That said she was able to complete the 6 minute walk at the end of the course, was unable at the initiation. She has days when she cannot exert at all, then on other days she can - she is unsure what may be influencing the difference. She has fatigue, feels depressed. She states that she has anxiety at night - ? Regarding the CPAP. She was prescribed Lunesta, but has not started due to concerns about hepatic side effects. She does still sometimes use albuterol. She is at times tearful today. She has a dry cough - worse recently, non-productive. She uses flonase qhs starting 1 month ago.   She is wearing CPAP every night, but she is concerned that it may not be working well, may have leak. She is sleeping 4-5 h a night.   She has done Pulm Rehab for her restrictive disease, walk test 09/20/20, completed program  ROV 01/18/21 --65 year old woman with multifactorial shortness of breath, including contributions of restrictive disease due to elevated hemidiaphragm.  She has mild obstructive lung disease on pulmonary function testing.  PMH significant for Whitney [name] cirrhosis, aortic stenosis with AVR, Sjogren's syndrome and OSA on CPAP.  She does not have any evidence for interstitial  lung disease or pulmonary hypertension in the setting of her Sjogren's. At her last visit I started her on Stiolto to see if she would get any benefit.  Today she reports that she definitely noticed a benefit, better breathing, better able to do chores around the house. The impact seemed to be better initially - although more recently she is having some increased exertional SOB w walking. She has gained 10 lbs since last visit here 10/2020. Also some anxiety component? ? Whether this is a response to or cause of her dyspnea. She is no longer exercising like she was when she did pulm rehab.  She has chronic cough with contribution from chronic rhinitis, on fluticasone nasal spray She did not start lunesta, although hepatology did tell her that she could try it.    Review of Systems As per HPI     Objective:   Physical Exam Vitals:   01/18/21 1559  BP: 128/76  Pulse: 62  Temp: 98.3 F (36.8 C)  TempSrc: Oral  SpO2: 97%  Weight: 230 lb 3.2 oz (104.4 kg)  Height: 5' 4"  (1.626 m)   Gen: Pleasant, overwt woman, in no distress,  somewhat depressed affect  ENT: No lesions,  mouth clear,  oropharynx clear, no postnasal drip  Neck: No JVD, no stridor  Lungs: No use of accessory muscles, clear, better BS on the L than on the R  Cardiovascular: RRR, late systolic M with intact S1 and S2  Musculoskeletal: No deformities, no cyanosis or clubbing  Neuro: alert,  awake, non focal  Skin: Warm, no lesions or rash      Assessment & Plan:  Dyspnea Multifactorial dyspnea with impact of restrictive lung disease due to her elevated hemidiaphragm, obesity.  No evidence of interstitial lung disease or pulmonary hypertension from her Sjogren's disease.  Also with obstructive disease by PFT and a clinical response to initiation of Stiolto last time.  Plan to continue this.  I suspect also significant component of deconditioning as she has benefited from pulmonary rehab in the past.  I talked to her  about this today, trying to get back to an exercise routine.  She is gained 10 pounds since I saw her 6 months ago.  I do not think that this relates to volume overload based on her overall exam but could consider since she does have underlying cardiac and hepatic disease.  OSA (obstructive sleep apnea) Plan to continue her CPAP every night  COPD (chronic obstructive pulmonary disease) ([place]) She did benefit from initiation of Stiolto.  Plan to continue this as well as her albuterol as needed.    Baltazar Apo, MD, PhD 01/18/2021, 4:39 PM [facility] [phone] or if no answer [phone]

## 2021-01-18 NOTE — Assessment & Plan Note (Signed)
Multifactorial dyspnea with impact of restrictive lung disease due to her elevated hemidiaphragm, obesity.  No evidence of interstitial lung disease or pulmonary hypertension from her Sjogren's disease.  Also with obstructive disease by PFT and a clinical response to initiation of Stiolto last time.  Plan to continue this.  I suspect also significant component of deconditioning as she has benefited from pulmonary rehab in the past.  I talked to her about this today, trying to get back to an exercise routine.  She is gained 10 pounds since I saw her 6 months ago.  I do not think that this relates to volume overload based on her overall exam but could consider since she does have underlying cardiac and hepatic disease.

## 2021-01-18 NOTE — Patient Instructions (Signed)
We will plan to continue Stiolto 2 puffs once daily. Keep albuterol available to use 2 puffs if needed for shortness of breath, chest tightness, wheezing. Continue to work on your exercise and conditioning as you have been doing  Continue fluticasone nasal spray, 2 sprays each nostril once daily. Follow with Dr. Lamonte Sakai in 12 months or sooner if you have any problems.

## 2021-01-18 NOTE — Assessment & Plan Note (Signed)
She did benefit from initiation of Stiolto.  Plan to continue this as well as her albuterol as needed.

## 2021-01-18 NOTE — Assessment & Plan Note (Signed)
Plan to continue her CPAP every night

## 2021-02-17 ENCOUNTER — Telehealth: Payer: Self-pay | Admitting: Emergency Medicine

## 2021-02-17 MED ORDER — STIOLTO RESPIMAT 2.5-2.5 MCG/ACT IN AERS: INHALATION_SPRAY | RESPIRATORY_TRACT | 3 refills | Status: DC

## 2021-02-17 NOTE — Telephone Encounter (Signed)
Call made to patient, confirmed DOB. She states she got stiolto from the pharmacy and she doe not think she knows how to put it together. I tried to trouble shoot over the phone, unsuccessful. Made aware she can come get a sample but someone will need to show her how to put the inhaler together since she had trouble doing so with the one from the pharmacy. Voiced understanding. Sample placed up front. Aware to ask for triage when she comes to pick up medication so we can show her how to use it.   Nothing further needed at this time.

## 2021-04-18 ENCOUNTER — Other Ambulatory Visit: Payer: Self-pay | Admitting: Emergency Medicine

## 2021-06-20 DIAGNOSIS — Z0000 Encounter for general adult medical examination without abnormal findings: Secondary | ICD-10-CM | POA: Diagnosis not present

## 2021-06-20 DIAGNOSIS — E1169 Type 2 diabetes mellitus with other specified complication: Secondary | ICD-10-CM | POA: Diagnosis not present

## 2021-06-20 DIAGNOSIS — Z23 Encounter for immunization: Secondary | ICD-10-CM | POA: Diagnosis not present

## 2021-06-20 DIAGNOSIS — E559 Vitamin D deficiency, unspecified: Secondary | ICD-10-CM | POA: Diagnosis not present

## 2021-06-20 DIAGNOSIS — G629 Polyneuropathy, unspecified: Secondary | ICD-10-CM | POA: Diagnosis not present

## 2021-06-20 DIAGNOSIS — E1342 Other specified diabetes mellitus with diabetic polyneuropathy: Secondary | ICD-10-CM | POA: Diagnosis not present

## 2021-06-20 DIAGNOSIS — D696 Thrombocytopenia, unspecified: Secondary | ICD-10-CM | POA: Diagnosis not present

## 2021-06-20 DIAGNOSIS — K7460 Unspecified cirrhosis of liver: Secondary | ICD-10-CM | POA: Diagnosis not present

## 2021-06-20 DIAGNOSIS — Z9884 Bariatric surgery status: Secondary | ICD-10-CM | POA: Diagnosis not present

## 2021-06-20 DIAGNOSIS — Z952 Presence of prosthetic heart valve: Secondary | ICD-10-CM | POA: Diagnosis not present

## 2021-06-20 DIAGNOSIS — E7800 Pure hypercholesterolemia, unspecified: Secondary | ICD-10-CM | POA: Diagnosis not present

## 2021-06-22 DIAGNOSIS — M549 Dorsalgia, unspecified: Secondary | ICD-10-CM | POA: Diagnosis not present

## 2021-06-23 DIAGNOSIS — R209 Unspecified disturbances of skin sensation: Secondary | ICD-10-CM | POA: Diagnosis not present

## 2021-06-23 DIAGNOSIS — R0989 Other specified symptoms and signs involving the circulatory and respiratory systems: Secondary | ICD-10-CM | POA: Diagnosis not present

## 2021-06-26 DIAGNOSIS — H16223 Keratoconjunctivitis sicca, not specified as Sjogren's, bilateral: Secondary | ICD-10-CM | POA: Diagnosis not present

## 2021-06-26 DIAGNOSIS — H04123 Dry eye syndrome of bilateral lacrimal glands: Secondary | ICD-10-CM | POA: Diagnosis not present

## 2021-06-26 DIAGNOSIS — H2513 Age-related nuclear cataract, bilateral: Secondary | ICD-10-CM | POA: Diagnosis not present

## 2021-06-26 DIAGNOSIS — E1165 Type 2 diabetes mellitus with hyperglycemia: Secondary | ICD-10-CM | POA: Diagnosis not present

## 2021-06-27 DIAGNOSIS — M549 Dorsalgia, unspecified: Secondary | ICD-10-CM | POA: Diagnosis not present

## 2021-07-04 DIAGNOSIS — M549 Dorsalgia, unspecified: Secondary | ICD-10-CM | POA: Diagnosis not present

## 2021-07-07 DIAGNOSIS — K766 Portal hypertension: Secondary | ICD-10-CM | POA: Diagnosis not present

## 2021-07-07 DIAGNOSIS — K7460 Unspecified cirrhosis of liver: Secondary | ICD-10-CM | POA: Diagnosis not present

## 2021-07-11 DIAGNOSIS — M549 Dorsalgia, unspecified: Secondary | ICD-10-CM | POA: Diagnosis not present

## 2021-07-17 DIAGNOSIS — M549 Dorsalgia, unspecified: Secondary | ICD-10-CM | POA: Diagnosis not present

## 2021-07-18 DIAGNOSIS — K7460 Unspecified cirrhosis of liver: Secondary | ICD-10-CM | POA: Diagnosis not present

## 2021-07-24 DIAGNOSIS — M549 Dorsalgia, unspecified: Secondary | ICD-10-CM | POA: Diagnosis not present

## 2021-08-08 DIAGNOSIS — M549 Dorsalgia, unspecified: Secondary | ICD-10-CM | POA: Diagnosis not present

## 2021-08-15 DIAGNOSIS — M549 Dorsalgia, unspecified: Secondary | ICD-10-CM | POA: Diagnosis not present

## 2021-08-23 DIAGNOSIS — H04123 Dry eye syndrome of bilateral lacrimal glands: Secondary | ICD-10-CM | POA: Diagnosis not present

## 2021-08-23 DIAGNOSIS — M3501 Sicca syndrome with keratoconjunctivitis: Secondary | ICD-10-CM | POA: Diagnosis not present

## 2021-08-25 IMAGING — MG MM DIGITAL DIAGNOSTIC UNILAT*R* W/ TOMO W/ CAD
3 series · 3 of 3 positions shown · non-contrast
Comparison: Previous exams.

CLINICAL DATA: Screening recall for right breast calcifications.

EXAM:
DIGITAL DIAGNOSTIC UNILATERAL RIGHT MAMMOGRAM WITH TOMOSYNTHESIS AND
CAD
TECHNIQUE: Right digital diagnostic mammography and breast tomosynthesis was
performed. The images were evaluated with computer-aided detection.

[R ML (1 of 2)]
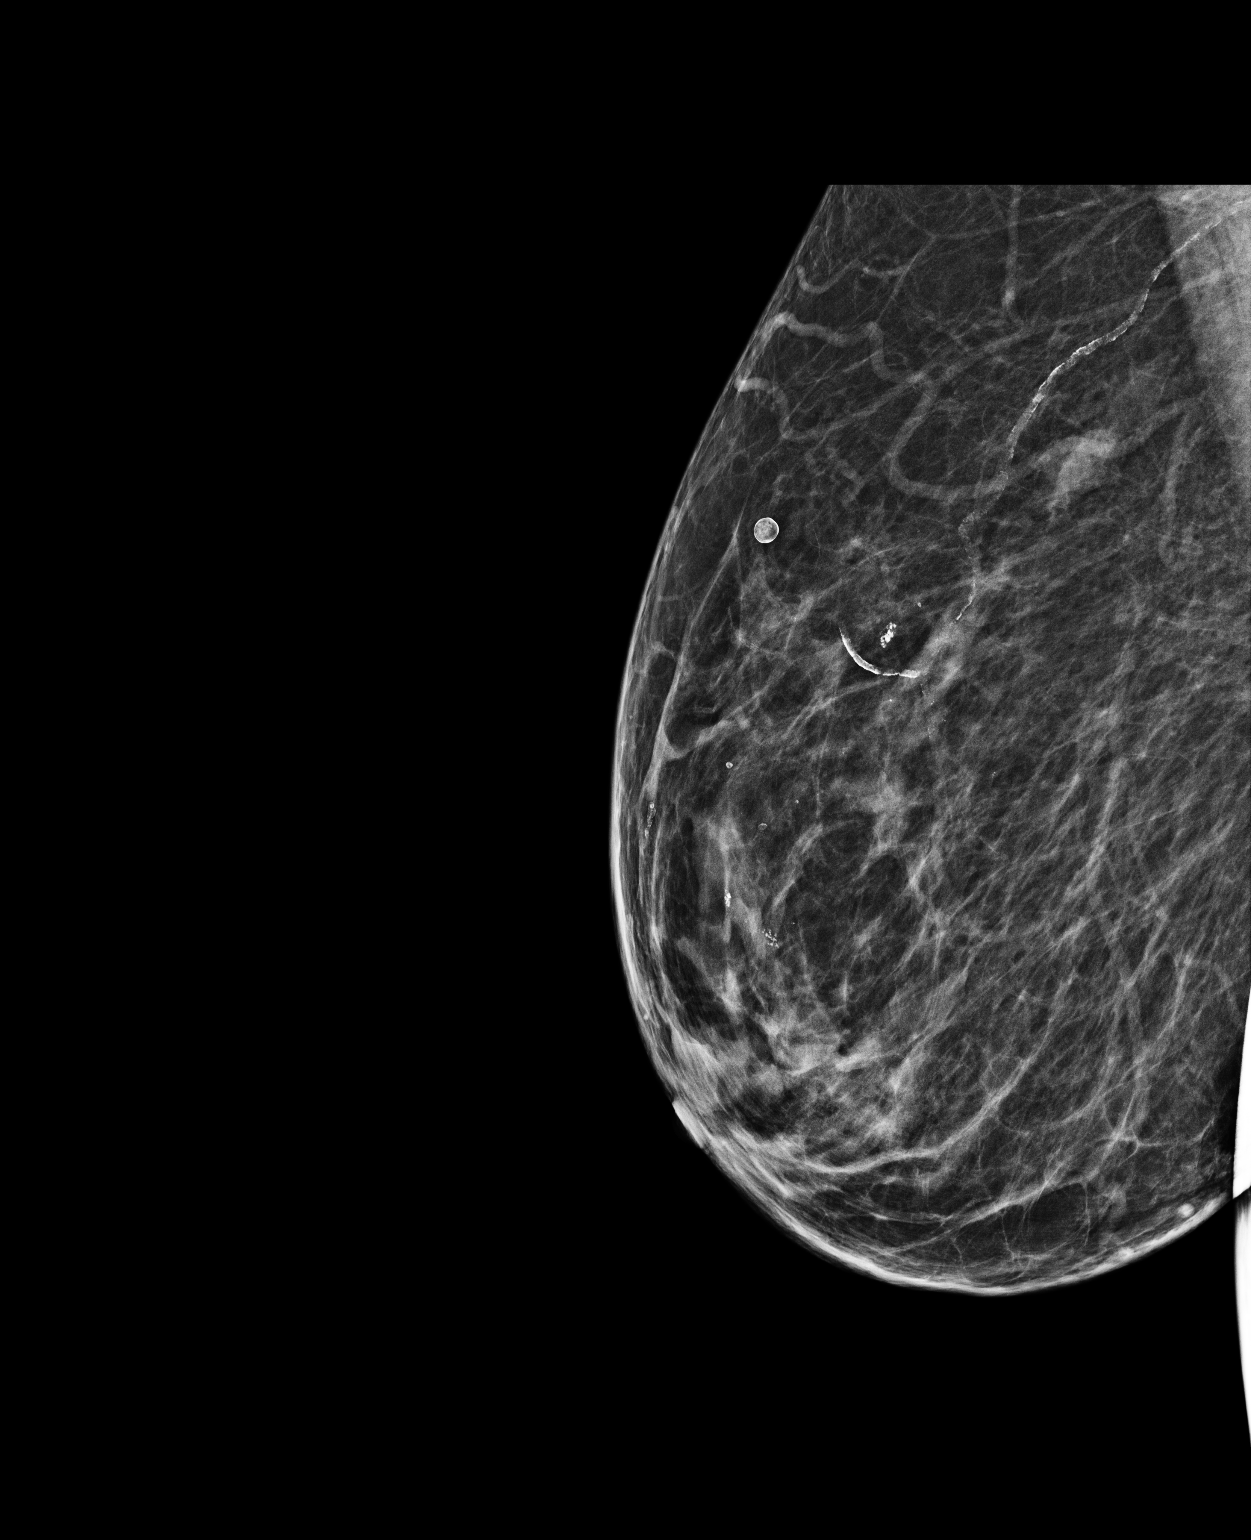

[R ML (2 of 2)]
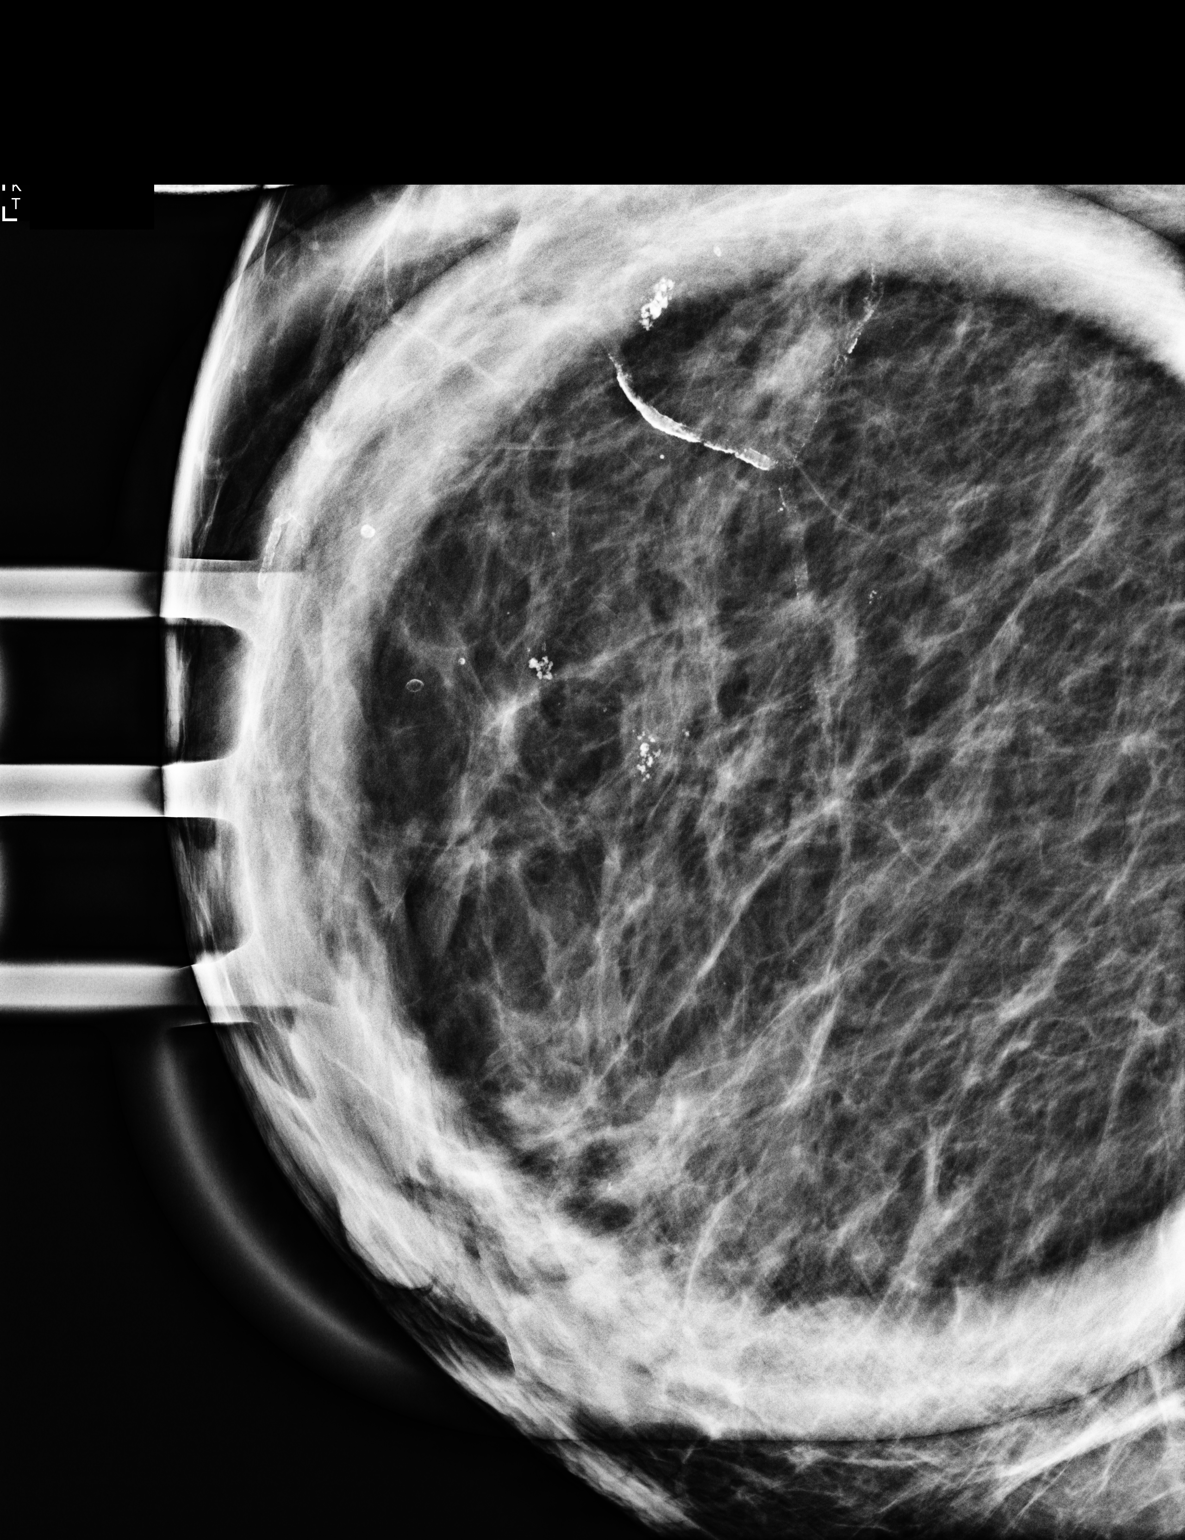

[R CC]
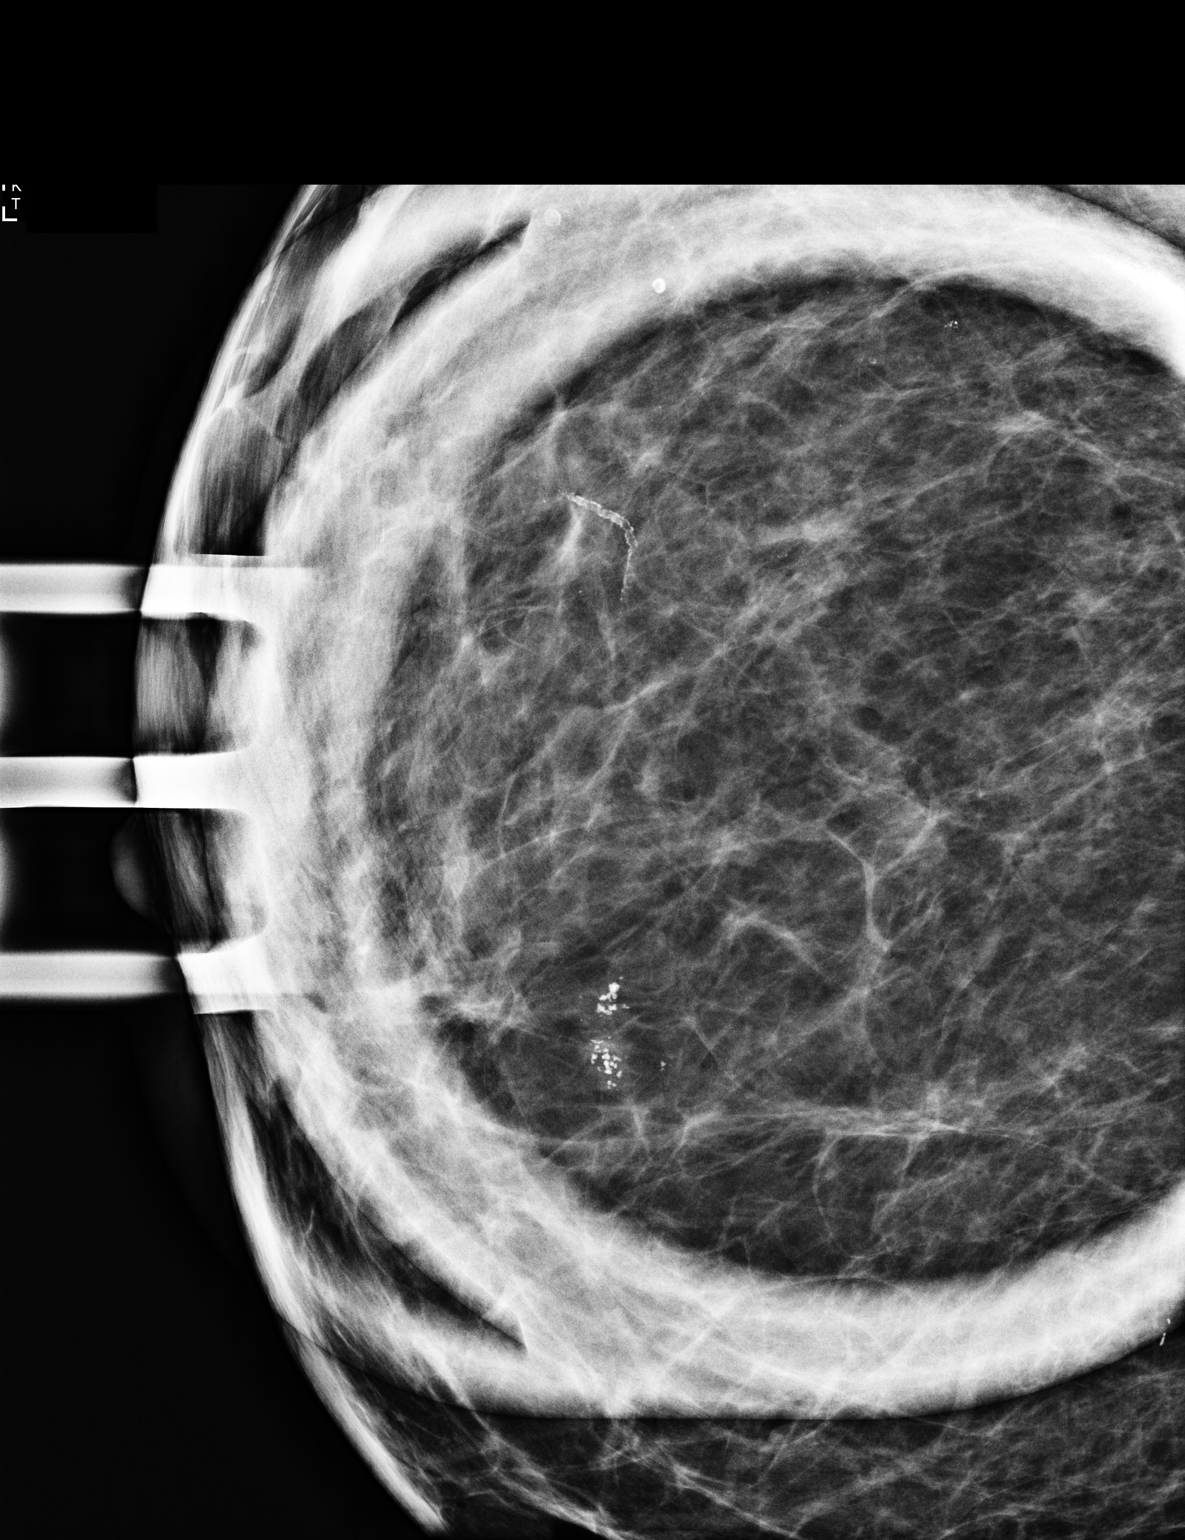

[3 of 3 positions shown; findings below may reference images not displayed]

ACR Breast Density Category b: There are scattered areas of
fibroglandular density.
FINDINGS: Spot compression magnification views were performed over the
slightly inner right breast demonstrating a 1.1 cm group of coarse
dystrophic type calcifications, benign in appearance. There is no
mammographic evidence of malignancy in the right breast.
IMPRESSION: No mammographic evidence of malignancy in the right breast.

RECOMMENDATION:
Screening mammogram in one year.(Code:[id number])

I have discussed the findings and recommendations with the patient.
If applicable, a reminder letter will be sent to the patient
regarding the next appointment.

BI-RADS CATEGORY  2: Benign.

## 2021-08-26 ENCOUNTER — Other Ambulatory Visit: Payer: Self-pay | Admitting: Emergency Medicine

## 2021-09-05 DIAGNOSIS — M549 Dorsalgia, unspecified: Secondary | ICD-10-CM | POA: Diagnosis not present

## 2021-09-12 DIAGNOSIS — M549 Dorsalgia, unspecified: Secondary | ICD-10-CM | POA: Diagnosis not present

## 2021-09-17 NOTE — Progress Notes (Signed)
?  ?Cardiology Office Note ? ? ?Date:  09/18/2021  ? ?ID:  Whitney [name], DOB February 26, 1956, MRN [id number] ? ?PCP:  Whitney Cruel, Whitney [name]  ? ? ?No chief complaint on file. ? ?S/p AVR ? ?Wt Readings from Last 3 Encounters:  ?09/18/21 222 lb 3.2 oz (100.8 kg)  ?01/18/21 230 lb 3.2 oz (104.4 kg)  ?10/19/20 220 lb 12.8 oz (100.2 kg)  ?  ? ?  ?History of Present Illness: ?Whitney [name] is a 66 y.o. female   with a past medical history significant for NASH and chronic thrombocytopenia treated at [facility], gastric sleeve surgery 2015 with 100 lb wt loss, aortic stenosis s/p tissue AVR 12/05/2017, DM type 2, hyperlipidemia. ? ?Husband passed away at 1 related to opioid consumption. ?  ?[name] had known aortic stenosis that had progressed with symptoms of exertional fatigue and tiredness.  Echocardiogram in April 2019 showed a mean gradient of 41 mmHg and a peak gradient of 80 mmHg.  LV systolic function was normal.  She was evaluated by her liver specialist who felt there was no contraindication to proceeding with an aortic valve replacement.  She underwent tissue AVR on 12/05/2017.  She developed brief runs of atrial fibrillation postoperatively.  She chemically converted back to sinus rhythm with amiodarone and Lopressor was increased to 25 mg twice daily.  She had postoperative volume overload which is improved.  She has chronic thrombocytopenia and platelets were stable at 54,000 in the hospital. ?  ?Post-op, she had intermittent shortness of breath.  She has had paralysis of the right hemidiaphragm.   She thinks this happened at the time of the chest tube removal.   ?  ?She still has intermittent shortness of breath.  There was a concern of portopulmonary HTN, Sjogrens.  She has gained weight as well.  She got her COVID vaccines. CPX test was considered: "Multiple possible etiologies including pulmonary hypertension related to her liver disease, autoimmune disease.  I reviewed the records from [place].  CPX testing was  considered as well as right heart catheterization.  Will get input from Whitney [name] as well to see how best to schedule the CPX test.  We can arrange potentially in a heart failure clinic as well.  I encouraged her to try to increase her exercise gradually.  She states she does not like to exercise.  She does not go out for regular walks.  I think just some routine activity would be helpful to try to increase her stamina." ?  ?The fatigue has been progressive.  She is concerned about her liver disease and her heart. She has leg cramps.  ?  ?She does try to be active with pulmonary rehab.  Her diaphragm issue is concerning to her.  ?  ?Echo at [facility] 2/21: ?Morphology: Normal  ?    Mobility: Fully Mobile  ?    AOV Note: 80m INSPIRIS  ? ?LEFT VENTRICLE                                      Anterior: Normal  ?        Size: Normal                                 Lateral: Normal  ? Contraction: Normal  Septal: Normal  ?  Closest EF: >55%(Estimated)  Calc.EF: 59% (3D)      Apical: Normal  ?   LV masses: No Masses                             Inferior: Normal  ? ?She has had some left sided chest / shoulder pain- feels like a bruise- more intense on the scapula area.  She massages the area and it gets better.  Episodes come on rarely.  No problems with walking.   ? ?Using Ozempic now- prescribed from [facility]- NASH. Weight increased.   ? ?Unable to walk since paralyzed hemidiaphragm.   ? ?Denies : exertional Chest pain. Dizziness. Leg edema. Nitroglycerin use. Orthopnea.  Paroxysmal nocturnal dyspnea. Syncope.   ? ?Has difficulty sleeping.  ? ? ? ? ?Past Medical History:  ?Diagnosis Date  ? Allergic rhinitis   ? Anemia   ? hx  ? Arthritis   ? Asthma   ? hx yrs ago  ? Cirrhosis ([place]) last albumin 3.3 done at [facility] 06-16-2014 (under care everywhere tab in epic)  ? Secondary to Fatty liver --  followed by hepatology at [place] (Whitney [name])   ? Depression   ? Diabetes mellitus type II   ? type 2  diet conrolled  ? Dyspnea   ? Fibromyalgia   ? GERD (gastroesophageal reflux disease)   ? Heart murmur   ? asymptomatic ---  1989 from rhuematic fever  ? History of exercise stress test   ? 05-05-2013---  negative bruce ETT given exercise workload,  no ischemia  ? History of hiatal hernia   ? History of kidney stones   ? History of rheumatic fever   ? 1989  ? Hyperlipidemia   ? Leukocytopenia   ? Moderate aortic stenosis   ? AVA area 1.1cm2---  cardiologist --  Whitney [name], 2014 in epic  ? NASH (nonalcoholic steatohepatitis)   ? OSA (obstructive sleep apnea)   ? was using CPAP before gastric sleeve 2015--  no uses after wt loss  ? Pneumonia   ? hx  ? Sjogren's syndrome ([place])   ? Thrombocytopenia ([place])   ? ? ?Past Surgical History:  ?Procedure Laterality Date  ? AORTIC VALVE REPLACEMENT N/A 12/05/2017  ? Procedure: AORTIC VALVE REPLACEMENT (AVR) TISSUE VALVE 21MM INSPIRIS;  Surgeon: Whitney Pollack, Whitney [name];  Location: [place];  Service: Open Heart Surgery;  Laterality: N/A;  ? COLONOSCOPY WITH ESOPHAGOGASTRODUODENOSCOPY (EGD)    ? CYSTOSCOPY WITH RETROGRADE PYELOGRAM, URETEROSCOPY AND STENT PLACEMENT Left 01/21/2015  ? Procedure: CYSTOSCOPY WITH LEFT  RETROGRADE PYELOGRAM, LEFT URETEROSCOPY AND STENT PLACEMENT;  Surgeon: Whitney Aloe, Whitney [name];  [facility];  Service: Urology;  Laterality: Left;  ? CYSTOSCOPY WITH RETROGRADE PYELOGRAM, URETEROSCOPY AND STENT PLACEMENT Bilateral 02/24/2015  ? Procedure: CYSTOSCOPY WITH RIGHT RETROGRADE PYELOGRAM, BLADDER BIOPSY FULGERATION LEFT URETEROSCOPY AND STENT REPLACEMENT;  Surgeon: Whitney Aloe, Whitney [name];  Location: [facility];  Service: Urology;  Laterality: Bilateral;  ? EXPLORATORY LAPAROSCOPY W/  CONE BIOPSY'S LEFT AND RIGHT LOBE OF LIVER  11-04-2007  ? HOLMIUM LASER APPLICATION Left 29/93/7169  ? Procedure: HOLMIUM LASER LITHOTRIPSY;  Surgeon: Whitney Aloe, Whitney [name];  Location: [facility];  Service: Urology;  Laterality:  Left;  ? HYSTEROSCOPY WITH D & C N/A 12/11/2012  ? Procedure: DILATATION AND CURETTAGE /HYSTEROSCOPY;  Surgeon: Maeola Sarah. Landry Mellow, Whitney [name];  Location: [facility];  Service: Gynecology;  Laterality: N/A;  ? INGUINAL HERNIA REPAIR Left 10/22/2016  ? Procedure: LEFT INGUINAL HERNIA REPAIR;  Surgeon: Rolm Bookbinder, Whitney [name];  Location: [place];  Service: General;  Laterality: Left;  TAP BLOCK  ? INSERTION OF MESH Left 10/22/2016  ? Procedure: INSERTION OF MESH;  Surgeon: Rolm Bookbinder, Whitney [name];  Location: [place];  Service: General;  Laterality: Left;  ? [place] RESECTION  07-27-2013  ? PUBOVAGINAL SLING  04-10-2001  ? [place]  ? RIGHT/LEFT HEART CATH AND CORONARY ANGIOGRAPHY N/A 08/23/2017  ? Procedure: RIGHT/LEFT HEART CATH AND CORONARY ANGIOGRAPHY;  Surgeon: [name], Whitney [name];  Location: [place] [facility];  Service: Cardiovascular;  Laterality: N/A;  ? TEE WITHOUT CARDIOVERSION N/A 12/05/2017  ? Procedure: TRANSESOPHAGEAL ECHOCARDIOGRAM (TEE);  Surgeon: Whitney Pollack, Whitney [name];  Location: [place];  Service: Open Heart Surgery;  Laterality: N/A;  ? TONSILLECTOMY  1975  ? TRANSTHORACIC ECHOCARDIOGRAM  06-04-2012  Whitney Irish Lack  ? mild LVH,  grade I diastolic dysfunction/  ef 60-65%/  moderate LAE/  mild MV calcifation without stenosis,  mild MR/  moderate AV stenosis,  cannot r/o bicupsid, area 1.1cm2/  mild dilated aortic root/  trivial TR  ? ? ? ?Current Outpatient Medications  ?Medication Sig Dispense Refill  ? amoxicillin (AMOXIL) 500 MG capsule Take 4 capsules 1 hour prior to any dental procedures 4 capsule 3  ? ezetimibe (ZETIA) 10 MG tablet Take 10 mg by mouth at bedtime.   11  ? milk thistle 175 MG tablet Take 175 mg by mouth daily.    ? nadolol (CORGARD) 20 MG tablet Take 20 mg by mouth daily.    ? PROAIR HFA 108 (90 Base) MCG/ACT inhaler INHALE 2 PUFFS INTO THE LUNGS EVERY 4 (FOUR) HOURS AS NEEDED FOR WHEEZING OR SHORTNESS OF BREATH. 8.5 each 5  ? Tiotropium Bromide-Olodaterol (STIOLTO RESPIMAT) 2.5-2.5 MCG/ACT AERS  INHALE 2 PUFFS BY MOUTH INTO THE LUNGS DAILY 4 g 3  ? cholecalciferol (D-VI-SOL) 10 MCG/ML LIQD Take 25 mcg by mouth daily. (Patient not taking: Reported on 01/18/2021)    ? cyanocobalamin 1000 MCG tablet Take 1,000 m

## 2021-09-18 ENCOUNTER — Ambulatory Visit: Payer: Medicare PPO | Admitting: Interventional Cardiology

## 2021-09-18 ENCOUNTER — Encounter: Payer: Self-pay | Admitting: Interventional Cardiology

## 2021-09-18 VITALS — BP 120/82 | HR 76 | Ht 64.0 in | Wt 222.2 lb

## 2021-09-18 DIAGNOSIS — I480 Paroxysmal atrial fibrillation: Secondary | ICD-10-CM

## 2021-09-18 DIAGNOSIS — E782 Mixed hyperlipidemia: Secondary | ICD-10-CM | POA: Diagnosis not present

## 2021-09-18 DIAGNOSIS — E119 Type 2 diabetes mellitus without complications: Secondary | ICD-10-CM | POA: Diagnosis not present

## 2021-09-18 DIAGNOSIS — Z953 Presence of xenogenic heart valve: Secondary | ICD-10-CM

## 2021-09-18 NOTE — Patient Instructions (Signed)
Medication Instructions:  ?Your physician recommends that you continue on your current medications as directed. Please refer to the Current Medication list given to you today. ? ?*If you need a refill on your cardiac medications before your next appointment, please call your pharmacy* ? ? ?Lab Work: ?none ?If you have labs (blood work) drawn today and your tests are completely normal, you will receive your results only by: ?MyChart Message (if you have MyChart) OR ?A paper copy in the mail ?If you have any lab test that is abnormal or we need to change your treatment, we will call you to review the results. ? ? ?Testing/Procedures: ?none ? ? ?Follow-Up: ?At [facility], you and your health needs are our priority.  As part of our continuing mission to provide you with exceptional heart care, we have created designated Provider Care Teams.  These Care Teams include your primary Cardiologist (physician) and Advanced Practice Providers (APPs -  Physician Assistants and Nurse Practitioners) who all work together to provide you with the care you need, when you need it. ? ?We recommend signing up for the patient portal called "MyChart".  Sign up information is provided on this After Visit Summary.  MyChart is used to connect with patients for Virtual Visits (Telemedicine).  Patients are able to view lab/test results, encounter notes, upcoming appointments, etc.  Non-urgent messages can be sent to your provider as well.   ?To learn more about what you can do with MyChart, go to [email or web address].   ? ?Your next appointment:   ?12 month(s) ? ?The format for your next appointment:   ?In Person ? ?Provider:   ?Larae Grooms, MD   ? ? ? ? ?Important Information About [place] ? ? ? ? ? ? ?

## 2021-09-28 ENCOUNTER — Encounter: Payer: Self-pay | Admitting: Pulmonary Disease

## 2021-09-28 ENCOUNTER — Ambulatory Visit (INDEPENDENT_AMBULATORY_CARE_PROVIDER_SITE_OTHER): Payer: Medicare PPO | Admitting: Pulmonary Disease

## 2021-09-28 VITALS — BP 118/68 | HR 88 | Temp 98.5°F | Ht 64.0 in | Wt 220.6 lb

## 2021-09-28 DIAGNOSIS — G4733 Obstructive sleep apnea (adult) (pediatric): Secondary | ICD-10-CM | POA: Diagnosis not present

## 2021-09-28 NOTE — Patient Instructions (Signed)
Obstructive sleep apnea Nonrestorative sleep  Continue using CPAP  We can send you for evaluation for an inspire device-inspire sleep.is we can get some information online   Trial of a chinstrap, mouth tape-this will help oral venting breathing to your mouth while CPAP is on  Your download of always reveals that the sleep apnea machine works  Adjust your humidification as tolerated Using a humidifier in the home may mean using the CPAP without putting water in it  I think you will continue to benefit from CPAP use  Continue your current inhalers  Continue to follow-up with Dr. Lamonte Sakai as well  I will see you in 6 months

## 2021-09-28 NOTE — Progress Notes (Signed)
Whitney [name]    [id number]    27-May-1955  Primary Care Physician:Ross, Dwyane Luo, MD  Referring Physician: Lawerance Cruel, MD [place],  [place] [id number]  Chief complaint:   Obstructive sleep apnea on CPAP Sleep onset insomnia  HPI:  She used a CPAP pump until about a month ago when she started having some rainout Continues to try to adjust the humidity setting on the machine  Even though overall she feels better with CPAP use, compliance data also shows that sleep apnea is well treated  Has not been able to get back to using machine on a regular basis  She does have shortness of breath on exertion Continues to follow-up with Dr. Lamonte Sakai for obstructive lung disease  At the last time she was here she was also complaining of insomnia for which she was started on Lunesta -never started it as she was concerned that she may not wake up from sleep  Usually tries to go to bed between 8 and 830, might take about 3 hours before falling asleep-still takes a lot of time to fall asleep May wake up following initially falling asleep and stay up for a few hours, sometimes she plays games on the phone.  Once she falls back asleep she tries to get some hours of sleep before finally getting up in the morning Awakening time is not fixed  Has had more problems since she had bariatric surgery, she fell she sustained a paralyzed hemidiaphragm following that and her breathing has been challenging since then  Pulmonary rehabilitation did help shortness of breath in the past  No past history of smoking  Medical history significant for history of nonalcoholic steatohepatitis, Sjogren's, fibromyalgia, asthma prediabetic   Outpatient Encounter Medications as of 09/28/2021  Medication Sig   amoxicillin (AMOXIL) 500 MG capsule Take 4 capsules 1 hour prior to any dental procedures   cyanocobalamin 1000 MCG tablet Take 1,000 mcg by mouth. Every other week   ezetimibe  (ZETIA) 10 MG tablet Take 10 mg by mouth at bedtime.    milk thistle 175 MG tablet Take 175 mg by mouth daily.   nadolol (CORGARD) 20 MG tablet Take 20 mg by mouth daily.   PROAIR HFA 108 (90 Base) MCG/ACT inhaler INHALE 2 PUFFS INTO THE LUNGS EVERY 4 (FOUR) HOURS AS NEEDED FOR WHEEZING OR SHORTNESS OF BREATH.   Tiotropium Bromide-Olodaterol (STIOLTO RESPIMAT) 2.5-2.5 MCG/ACT AERS INHALE 2 PUFFS BY MOUTH INTO THE LUNGS DAILY   OZEMPIC, 0.25 OR 0.5 MG/DOSE, 2 MG/3ML SOPN Inject into the skin.   [DISCONTINUED] cholecalciferol (D-VI-SOL) 10 MCG/ML LIQD Take 25 mcg by mouth daily. (Patient not taking: Reported on 09/28/2021)   [DISCONTINUED] eszopiclone (LUNESTA) 1 MG TABS tablet Take 1 tablet (1 mg total) by mouth at bedtime as needed for sleep. Take immediately before bedtime   [DISCONTINUED] pyridOXINE (B-6) 50 MG tablet Take by mouth.   [DISCONTINUED] TURMERIC PO Take by mouth.   No facility-administered encounter medications on file as of 09/28/2021.    Allergies as of 09/28/2021 - Review Complete 09/28/2021  Allergen Reaction Noted   Doxycycline Hives and Rash 03/15/2011   Naproxen Rash and Hives 03/15/2011    Past Medical History:  Diagnosis Date   Allergic rhinitis    Anemia    hx   Arthritis    Asthma    hx yrs ago   Cirrhosis ([place]) last albumin 3.3 done at [place] 06-16-2014 (under care  everywhere tab in epic)   Secondary to Fatty liver --  followed by hepatology at [place] (dr Gerald Dexter)    Depression    Diabetes mellitus type II    type 2 diet conrolled   Dyspnea    Fibromyalgia    GERD (gastroesophageal reflux disease)    Heart murmur    asymptomatic ---  1989 from rhuematic fever   History of exercise stress test    05-05-2013---  negative bruce ETT given exercise workload,  no ischemia   History of hiatal hernia    History of kidney stones    History of rheumatic fever    1989   Hyperlipidemia    Leukocytopenia    Moderate aortic stenosis    AVA area 1.1cm2---   cardiologist --  dr Concepcion Living, 2014 in epic   NASH (nonalcoholic steatohepatitis)    OSA (obstructive sleep apnea)    was using CPAP before gastric sleeve 2015--  no uses after wt loss   Pneumonia    hx   Sjogren's syndrome ([place])    Thrombocytopenia ([facility])     Past Surgical History:  Procedure Laterality Date   AORTIC VALVE REPLACEMENT N/A 12/05/2017   Procedure: AORTIC VALVE REPLACEMENT (AVR) TISSUE VALVE 21MM INSPIRIS;  Surgeon: Gaye Pollack, MD;  Location: [place];  Service: Open Heart Surgery;  Laterality: N/A;   COLONOSCOPY WITH ESOPHAGOGASTRODUODENOSCOPY (EGD)     CYSTOSCOPY WITH RETROGRADE PYELOGRAM, URETEROSCOPY AND STENT PLACEMENT Left 01/21/2015   Procedure: CYSTOSCOPY WITH LEFT  RETROGRADE PYELOGRAM, LEFT URETEROSCOPY AND STENT PLACEMENT;  Surgeon: Festus Aloe, MD;  [facility];  Service: Urology;  Laterality: Left;   CYSTOSCOPY WITH RETROGRADE PYELOGRAM, URETEROSCOPY AND STENT PLACEMENT Bilateral 02/24/2015   Procedure: CYSTOSCOPY WITH RIGHT RETROGRADE PYELOGRAM, BLADDER BIOPSY FULGERATION LEFT URETEROSCOPY AND STENT REPLACEMENT;  Surgeon: Festus Aloe, MD;  [facility];  Service: Urology;  Laterality: Bilateral;   EXPLORATORY LAPAROSCOPY W/  CONE BIOPSY'S LEFT AND RIGHT LOBE OF LIVER  11-04-2007   HOLMIUM LASER APPLICATION Left 81/44/8185   Procedure: HOLMIUM LASER LITHOTRIPSY;  Surgeon: Festus Aloe, MD;  Location: [facility];  Service: Urology;  Laterality: Left;   HYSTEROSCOPY WITH D & C N/A 12/11/2012   Procedure: DILATATION AND CURETTAGE /HYSTEROSCOPY;  Surgeon: Maeola Sarah. Landry Mellow, MD;  Location: [place] [facility];  Service: Gynecology;  Laterality: N/A;   INGUINAL HERNIA REPAIR Left 10/22/2016   Procedure: LEFT INGUINAL HERNIA REPAIR;  Surgeon: Rolm Bookbinder, MD;  Location: [place];  Service: General;  Laterality: Left;  TAP BLOCK   INSERTION OF MESH Left 10/22/2016   Procedure: INSERTION OF MESH;  Surgeon:  Rolm Bookbinder, MD;  Location: [place];  Service: General;  Laterality: Left;   LAPAROSCOPIC GASTRIC SLEEVE RESECTION  07-27-2013   PUBOVAGINAL SLING  04-10-2001   [place]   RIGHT/LEFT HEART CATH AND CORONARY ANGIOGRAPHY N/A 08/23/2017   Procedure: RIGHT/LEFT HEART CATH AND CORONARY ANGIOGRAPHY;  Surgeon: [name], MD;  Location: [place] [facility];  Service: Cardiovascular;  Laterality: N/A;   TEE WITHOUT CARDIOVERSION N/A 12/05/2017   Procedure: TRANSESOPHAGEAL ECHOCARDIOGRAM (TEE);  Surgeon: Gaye Pollack, MD;  Location: [place];  Service: Open Heart Surgery;  Laterality: N/A;   TONSILLECTOMY  1975   TRANSTHORACIC ECHOCARDIOGRAM  06-04-2012  dr Irish Lack   mild LVH,  grade I diastolic dysfunction/  ef 60-65%/  moderate LAE/  mild MV calcifation without stenosis,  mild MR/  moderate AV stenosis,  cannot r/o bicupsid,  area 1.1cm2/  mild dilated aortic root/  trivial TR    Family History  Problem Relation Age of Onset   Alzheimer's disease Father    Hip fracture Father    Asthma Father    Heart disease Father    Heart attack Father    Hypertension Father    Rheum arthritis Mother    Heart disease Mother    Allergies Daughter    Asthma Paternal Grandmother    Asthma Daughter    Stroke Neg Hx     Social History   Socioeconomic History   Marital status: Widowed    Spouse name: Married to [employer]   Number of children: Not on file   Years of education: Not on file   Highest education level: Not on file  Occupational History   Occupation: principal of [employer]  Tobacco Use   Smoking status: Never   Smokeless tobacco: Never  Vaping Use   Vaping Use: Never used  Substance and Sexual Activity   Alcohol use: Yes    Comment: rarely   Drug use: No   Sexual activity: Not on file  Other Topics Concern   Not on file  Social History Narrative   Not on file   Social Determinants of Health   Financial Resource Strain: Not on file  Food Insecurity: Not on file   Transportation Needs: Not on file  Physical Activity: Not on file  Stress: Not on file  Social Connections: Not on file  Intimate Partner Violence: Not on file    Review of Systems  Constitutional:  Negative for fever.  HENT:         Nasal congestion  Respiratory:  Positive for shortness of breath. Negative for chest tightness.   Cardiovascular:  Negative for chest pain.  Psychiatric/Behavioral:  Positive for sleep disturbance.    Vitals:   09/28/21 1003  BP: 118/68  Pulse: 88  Temp: 98.5 F (36.9 C)  SpO2: 97%     Physical Exam Constitutional:      Appearance: She is obese.  HENT:     Head: Normocephalic.     Mouth/Throat:     Pharynx: No oropharyngeal exudate.  Eyes:     General:        Right eye: No discharge.        Left eye: No discharge.     Pupils: Pupils are equal, round, and reactive to light.  Cardiovascular:     Rate and Rhythm: Normal rate and regular rhythm.     Heart sounds: No murmur heard.   No friction rub.  Pulmonary:     Effort: No respiratory distress.     Breath sounds: No stridor. No wheezing or rhonchi.     Comments: Clear breath sounds bilaterally Musculoskeletal:     Cervical back: No rigidity or tenderness.  Neurological:     Mental Status: She is alert.  Psychiatric:        Mood and Affect: Mood normal.   Data Reviewed: Sleep study from 09/01/2019 reviewed showing moderate obstructive sleep apnea  Compliance data as written above  Compliance data reviewed with the patient Residual AHI of 2.3, use the machine regularly up until about a month ago  Assessment:  Sleep onset and sleep maintenance insomnia -Did not initiate sleep aid  Moderate obstructive sleep apnea -Encouraged to continue using CPAP on a nightly basis -She knew she did benefit from CPAP use  Rhinitis  History of paralyzed hemidiaphragm  Shortness of breath  on exertion for which she is on inhalers  Plan/Recommendations:  Graded exercise as  tolerated  Encouraged to resume CPAP use on a nightly basis  Humidification about home environment may help  Did discuss indications for an inspire device, online review was performed during the visit  The possibility of oral venting also discussed which may be helped with a chinstrap or a mouth tape  Encouraged to continue current inhalers  Tentative follow-up in 6 months    Sherrilyn Rist MD [facility] 09/28/2021, 10:17 AM  CC: Lawerance Cruel, MD

## 2021-10-10 ENCOUNTER — Ambulatory Visit: Payer: Medicare PPO | Admitting: Podiatry

## 2021-10-10 DIAGNOSIS — L600 Ingrowing nail: Secondary | ICD-10-CM | POA: Diagnosis not present

## 2021-10-10 DIAGNOSIS — B351 Tinea unguium: Secondary | ICD-10-CM

## 2021-10-10 NOTE — Patient Instructions (Signed)
I have ordered a medication for you that will come from [facility] in [place]. They should be calling you to verify insurance and will mail the medication to you. If you live close by then you can go by their pharmacy to pick up the medication. Their phone number is [phone]. If you do not hear from them in the next few days, please give us a call at [phone].   

## 2021-10-17 NOTE — Progress Notes (Signed)
Subjective:   Patient ID: Whitney [name], female   DOB: 66 y.o.   MRN: [id number]   HPI 66 year old female presents the office for concerns of bilateral toenail fungus.  She states about 25 years ago the nail lifted up and never grew back correctly.  She states that the left lateral hallux toenail does get tender as well as the third toenail.  Denies any swelling or redness or any drainage.  She does try to trim the nails.   Review of Systems  All other systems reviewed and are negative.  Past Medical History:  Diagnosis Date   Allergic rhinitis    Anemia    hx   Arthritis    Asthma    hx yrs ago   Cirrhosis ([place]) last albumin 3.3 done at [facility] 06-16-2014 (under care everywhere tab in epic)   Secondary to Fatty liver --  followed by hepatology at [facility] (dr Gerald Dexter)    Depression    Diabetes mellitus type II    type 2 diet conrolled   Dyspnea    Fibromyalgia    GERD (gastroesophageal reflux disease)    Heart murmur    asymptomatic ---  1989 from rhuematic fever   History of exercise stress test    05-05-2013---  negative bruce ETT given exercise workload,  no ischemia   History of hiatal hernia    History of kidney stones    History of rheumatic fever    1989   Hyperlipidemia    Leukocytopenia    Moderate aortic stenosis    AVA area 1.1cm2---  cardiologist --  dr Concepcion Living, 2014 in epic   NASH (nonalcoholic steatohepatitis)    OSA (obstructive sleep apnea)    was using CPAP before gastric sleeve 2015--  no uses after wt loss   Pneumonia    hx   Sjogren's syndrome ([place])    Thrombocytopenia (H[facility])     Past Surgical History:  Procedure Laterality Date   AORTIC VALVE REPLACEMENT N/A 12/05/2017   Procedure: AORTIC VALVE REPLACEMENT (AVR) TISSUE VALVE 21MM INSPIRIS;  Surgeon: Gaye Pollack, MD;  Location: [facility];  Service: Open Heart Surgery;  Laterality: N/A;   COLONOSCOPY WITH ESOPHAGOGASTRODUODENOSCOPY (EGD)     CYSTOSCOPY WITH RETROGRADE PYELOGRAM, URETEROSCOPY AND  STENT PLACEMENT Left 01/21/2015   Procedure: CYSTOSCOPY WITH LEFT  RETROGRADE PYELOGRAM, LEFT URETEROSCOPY AND STENT PLACEMENT;  Surgeon: Festus Aloe, MD;  Location: [facility];  Service: Urology;  Laterality: Left;   CYSTOSCOPY WITH RETROGRADE PYELOGRAM, URETEROSCOPY AND STENT PLACEMENT Bilateral 02/24/2015   Procedure: CYSTOSCOPY WITH RIGHT RETROGRADE PYELOGRAM, BLADDER BIOPSY FULGERATION LEFT URETEROSCOPY AND STENT REPLACEMENT;  Surgeon: Festus Aloe, MD;  Location: [facility];  Service: Urology;  Laterality: Bilateral;   EXPLORATORY LAPAROSCOPY W/  CONE BIOPSY'S LEFT AND RIGHT LOBE OF LIVER  11-04-2007   HOLMIUM LASER APPLICATION Left 64/33/2951   Procedure: HOLMIUM LASER LITHOTRIPSY;  Surgeon: Festus Aloe, MD;  Location: [facility];  Service: Urology;  Laterality: Left;   HYSTEROSCOPY WITH D & C N/A 12/11/2012   Procedure: DILATATION AND CURETTAGE /HYSTEROSCOPY;  Surgeon: Maeola Sarah. Landry Mellow, MD;  Location: [facility];  Service: Gynecology;  Laterality: N/A;   INGUINAL HERNIA REPAIR Left 10/22/2016   Procedure: LEFT INGUINAL HERNIA REPAIR;  Surgeon: Rolm Bookbinder, MD;  Location: [facility];  Service: General;  Laterality: Left;  TAP BLOCK   INSERTION OF MESH Left 10/22/2016   Procedure: INSERTION OF MESH;  Surgeon: Rolm Bookbinder, MD;  Location: [facility];  Service: General;  Laterality: Left;   LAPAROSCOPIC GASTRIC SLEEVE RESECTION  07-27-2013   PUBOVAGINAL SLING  04-10-2001   [facility]   RIGHT/LEFT HEART CATH AND CORONARY ANGIOGRAPHY N/A 08/23/2017   Procedure: RIGHT/LEFT HEART CATH AND CORONARY ANGIOGRAPHY;  Surgeon: [name], MD;  Location: [facility];  Service: Cardiovascular;  Laterality: N/A;   TEE WITHOUT CARDIOVERSION N/A 12/05/2017   Procedure: TRANSESOPHAGEAL ECHOCARDIOGRAM (TEE);  Surgeon: Gaye Pollack, MD;  Location: [facility];  Service: Open Heart Surgery;  Laterality: N/A;   TONSILLECTOMY  1975   TRANSTHORACIC  ECHOCARDIOGRAM  06-04-2012  dr Irish Lack   mild LVH,  grade I diastolic dysfunction/  ef 60-65%/  moderate LAE/  mild MV calcifation without stenosis,  mild MR/  moderate AV stenosis,  cannot r/o bicupsid, area 1.1cm2/  mild dilated aortic root/  trivial TR     Current Outpatient Medications:    amoxicillin (AMOXIL) 500 MG capsule, Take 4 capsules 1 hour prior to any dental procedures, Disp: 4 capsule, Rfl: 3   cyanocobalamin 1000 MCG tablet, Take 1,000 mcg by mouth. Every other week, Disp: , Rfl:    ezetimibe (ZETIA) 10 MG tablet, Take 10 mg by mouth at bedtime. , Disp: , Rfl: 11   milk thistle 175 MG tablet, Take 175 mg by mouth daily., Disp: , Rfl:    nadolol (CORGARD) 20 MG tablet, Take 20 mg by mouth daily., Disp: , Rfl:    OZEMPIC, 0.25 OR 0.5 MG/DOSE, 2 MG/3ML SOPN, Inject into the skin., Disp: , Rfl:    PROAIR HFA 108 (90 Base) MCG/ACT inhaler, INHALE 2 PUFFS INTO THE LUNGS EVERY 4 (FOUR) HOURS AS NEEDED FOR WHEEZING OR SHORTNESS OF BREATH., Disp: 8.5 each, Rfl: 5   Tiotropium Bromide-Olodaterol (STIOLTO RESPIMAT) 2.5-2.5 MCG/ACT AERS, INHALE 2 PUFFS BY MOUTH INTO THE LUNGS DAILY, Disp: 4 g, Rfl: 3  Allergies  Allergen Reactions   Doxycycline Hives and Rash   Naproxen Rash and Hives           Objective:  Physical Exam  General: AAO x3, NAD  Dermatological: Bilateral hallux is hypertrophic, dystrophic with yellow, brown discoloration and there is incurvation of the left hallux toenail.  The nails cut back on the right hallux it is loose with underlying nailbed distally but firmly here proximally.  No edema, erythema or signs of infection.  No open lesions.  Vascular: Dorsalis Pedis artery and Posterior Tibial artery pedal pulses are 2/4 bilateral with immedate capillary fill time. There is no pain with calf compression, swelling, warmth, erythema.   Neruologic: Grossly intact via light touch bilateral.   Musculoskeletal: No gross boney pedal deformities bilateral. No pain,  crepitus, or limitation noted with foot and ankle range of motion bilateral. Muscular strength 5/5 in all groups tested bilateral.  Gait: Unassisted, Nonantalgic.       Assessment:   Symptomatic onychosis, ingrown toenail     Plan:  -Treatment options discussed including all alternatives, risks, and complications -Etiology of symptoms were discussed -Regards to nail dystrophy, fungus we discussed different treatment options.  Given her history of cirrhosis and his issues were to hold off on oral medication but I will order compound cream [facility] to help with the nail fungus.  I excluded the ibuprofen on the medication given her allergy to naproxen. -Sharp debride the nails.  Minimal bleeding occurred on the right lateral nail border but no laceration noted.  Antibiotic ointment and bandage applied.  Monitor  for any signs or symptoms of infection.  If ingrown toenail continues or if the nails become more tender or worsen without improvement consider partial versus total nail removal.  Trula Slade DPM

## 2021-11-03 DIAGNOSIS — E669 Obesity, unspecified: Secondary | ICD-10-CM | POA: Diagnosis not present

## 2021-11-03 DIAGNOSIS — E1342 Other specified diabetes mellitus with diabetic polyneuropathy: Secondary | ICD-10-CM | POA: Diagnosis not present

## 2021-11-20 DIAGNOSIS — H2513 Age-related nuclear cataract, bilateral: Secondary | ICD-10-CM | POA: Diagnosis not present

## 2021-11-20 DIAGNOSIS — M3501 Sicca syndrome with keratoconjunctivitis: Secondary | ICD-10-CM | POA: Diagnosis not present

## 2021-11-20 DIAGNOSIS — E1165 Type 2 diabetes mellitus with hyperglycemia: Secondary | ICD-10-CM | POA: Diagnosis not present

## 2021-11-20 DIAGNOSIS — G510 Bell's palsy: Secondary | ICD-10-CM | POA: Diagnosis not present

## 2021-12-01 ENCOUNTER — Ambulatory Visit: Payer: Self-pay

## 2021-12-01 ENCOUNTER — Telehealth: Payer: Self-pay

## 2021-12-01 DIAGNOSIS — Z79899 Other long term (current) drug therapy: Secondary | ICD-10-CM | POA: Diagnosis not present

## 2021-12-01 DIAGNOSIS — M3500 Sicca syndrome, unspecified: Secondary | ICD-10-CM | POA: Diagnosis not present

## 2021-12-01 NOTE — Patient Outreach (Signed)
Error Please Disregard

## 2021-12-01 NOTE — Patient Outreach (Signed)
  Care Management   Outreach Note  12/01/2021 Name: Whitney [name] MRN: [id number] DOB: 06-29-1955  An unsuccessful telephone outreach was attempted today. The patient was referred to the case management team for assistance with care management and care coordination.   Follow Up Plan:  A HIPAA compliant voice message was left today requesting a return call.   [facility] Management [phone]

## 2021-12-08 ENCOUNTER — Ambulatory Visit: Payer: Self-pay

## 2021-12-11 NOTE — Patient Instructions (Signed)
Visit Information Thank you for allowing the Care Management team to participate in your care. It was great speaking with you!  Following are the goals we discussed:   Goals Addressed             This Visit's Progress    Medication Assistance       Care Coordination Interventions: Reviewed medications. Patient reports taking as instructed and tolerating prescribed regimens well. Reports using caution with OTC medications and supplements as advised d/t non-alcoholic fatty liver disease. Denies concerns with medication  management. Expressed concerns regarding cost of prescriptions. Discussed option for outreach with a pharmacist. Whitney [name] if a pharmacist is embedded in her practice. Will follow up to confirm next week. Will submit referral for [facility] if embedded services are not available at her practice.        Whitney [name] verbalized understanding of information discussed during the telephonic outreach today. Declined need for mailed/printed instructions.  A member of the care management team will follow up next week.  [facility] Management [phone]

## 2021-12-11 NOTE — Patient Outreach (Signed)
  Care Coordination   Initial Visit Note    Name: Whitney [name] MRN: [id number] DOB: 1955/07/16  Whitney [name] is a 66 y.o. year old female who sees Whitney Cruel, MD for primary care. I spoke with  Whitney [name] by phone today  What matters to the patients health and wellness today?  Medications    Goals Addressed             This Visit's Progress    Medication Assistance       Care Coordination Interventions: Reviewed medications. Patient reports taking as instructed and tolerating prescribed regimens well. Reports using caution with OTC medications and supplements as advised d/t non-alcoholic fatty liver disease. Denies concerns with medication  management. Expressed concerns regarding cost of prescriptions. Discussed option for outreach with a pharmacist. Whitney [name] if a pharmacist is embedded in her practice. Will follow up to confirm next week. Will submit referral for [facility] if embedded services are not available at her practice.        SDOH assessments and interventions completed:  Yes  SDOH Interventions Today    Flowsheet Row Most Recent Value  SDOH Interventions   Food Insecurity Interventions Intervention Not Indicated  Transportation Interventions Intervention Not Indicated        Care Coordination Interventions Activated:  Yes  Care Coordination Interventions:  Yes, provided   Follow up plan: Will follow up next week.  Encounter Outcome:  Pt. Visit Completed   [facility] Management [phone]

## 2021-12-15 ENCOUNTER — Ambulatory Visit: Payer: Self-pay

## 2021-12-15 NOTE — Patient Outreach (Unsigned)
  Care Coordination   Follow Up Visit Note   12/15/2021 Name: Whitney [name] MRN: [id number] DOB: July 18, 1955  Whitney [name] is a 66 y.o. year old female who sees Lawerance Cruel, MD for primary care. I {TYPECCVISIT:27766}  What matters to the patients health and wellness today?  ***    Goals Addressed   None     SDOH assessments and interventions completed:  {yes/no:20286}{THN Tip this will not be part of the note when signed-REQUIRED REPORT FIELD DO NOT DELETE (Optional):27901}     Care Coordination Interventions Activated:  {CCYES/NO:27769} {THN Tip this will not be part of the note when signed-REQUIRED REPORT FIELD DO NOT DELETE (Optional):27901} Care Coordination Interventions:  {INTERVENTIONS:27767} {THN Tip this will not be part of the note when signed-REQUIRED REPORT FIELD DO NOT DELETE (Optional):27901}  Follow up plan: {CCFOLLOWUP:27768}   Encounter Outcome:  {ENCOUTCOME:27770} {THN Tip this will not be part of the note when signed-REQUIRED REPORT FIELD DO NOT DELETE (Optional):27901}

## 2021-12-22 ENCOUNTER — Ambulatory Visit: Payer: Self-pay

## 2021-12-25 NOTE — Patient Outreach (Signed)
  Care Coordination   Follow Up Visit Note    Name: Whitney [name] MRN: [id number] DOB: [birth year]  Whitney [name] is a 66 y.o. year old female who sees Lawerance Cruel, MD for primary care. I  collaborated with the [place] clinic team today.  What matters to the patients health and wellness today?  Medications    Goals Addressed             This Visit's Progress    Medication Assistance       Care Coordination Interventions: Reviewed medications. Patient reports taking as instructed and tolerating prescribed regimens well. Reports using caution with OTC medications and supplements as advised d/t non-alcoholic fatty liver disease. Denies concerns with medication  management. Expressed concerns regarding cost of prescriptions. Discussed option for outreach with a pharmacist. Marena Chancy if a pharmacist is embedded in her practice. Will follow up to confirm next week. Will submit referral for [facility] if embedded services are not available at her practice. Update : 12/22/21: Collaborated with [facility] team regarding request for Pharmacy outreach. Discussed concerns related to prescription costs.  Confirmed that [facility] Pharmacist are available in the clinic. The team will review to determine eligibility. Will submit request for services if patient is eligible.        SDOH assessments and interventions completed:  No     Care Coordination Interventions Activated:  Yes  Care Coordination Interventions:  Yes, provided   Follow up plan:  Will follow up next week.    Encounter Outcome:  Pt. Visit Completed   [facility] Management [phone]

## 2022-01-05 ENCOUNTER — Other Ambulatory Visit: Payer: Self-pay | Admitting: Emergency Medicine

## 2022-01-11 DIAGNOSIS — K769 Liver disease, unspecified: Secondary | ICD-10-CM | POA: Diagnosis not present

## 2022-01-11 DIAGNOSIS — K7460 Unspecified cirrhosis of liver: Secondary | ICD-10-CM | POA: Diagnosis not present

## 2022-01-11 DIAGNOSIS — R932 Abnormal findings on diagnostic imaging of liver and biliary tract: Secondary | ICD-10-CM | POA: Diagnosis not present

## 2022-01-11 DIAGNOSIS — K766 Portal hypertension: Secondary | ICD-10-CM | POA: Diagnosis not present

## 2022-01-18 ENCOUNTER — Encounter: Payer: Self-pay | Admitting: Emergency Medicine

## 2022-01-18 ENCOUNTER — Ambulatory Visit: Payer: Medicare PPO | Admitting: Emergency Medicine

## 2022-01-18 VITALS — BP 134/76 | HR 77 | Temp 98.3°F | Ht 64.0 in | Wt 218.2 lb

## 2022-01-18 DIAGNOSIS — G4733 Obstructive sleep apnea (adult) (pediatric): Secondary | ICD-10-CM | POA: Diagnosis not present

## 2022-01-18 DIAGNOSIS — J449 Chronic obstructive pulmonary disease, unspecified: Secondary | ICD-10-CM

## 2022-01-18 DIAGNOSIS — R053 Chronic cough: Secondary | ICD-10-CM

## 2022-01-18 DIAGNOSIS — Z23 Encounter for immunization: Secondary | ICD-10-CM

## 2022-01-18 NOTE — Assessment & Plan Note (Signed)
Continue CPAP nightly.  Good compliance.  Follows with Dr. Ander Slade

## 2022-01-18 NOTE — Patient Instructions (Signed)
Please continue your Stiolto 2 puffs once daily. Keep albuterol available to use 2 puffs if needed for shortness of breath, chest tightness, wheezing.  We will refill this for you today. Continue your CPAP every night. Use your fluticasone nasal spray as needed for any increase in nasal congestion You could consider trying omeprazole 20 mg once daily to see if this impacts silent reflux, coughing. Follow with Dr. Lamonte Sakai in 1 year or sooner if you have any problems.

## 2022-01-18 NOTE — Progress Notes (Signed)
Subjective:    Patient ID: Whitney [name], female    DOB: 1955/06/19, 66 y.o.   MRN: [id number]  HPI  ROV 01/18/21 --66 year old woman with multifactorial shortness of breath, including contributions of restrictive disease due to elevated hemidiaphragm.  She has mild obstructive lung disease on pulmonary function testing.  PMH significant for Karlene Lineman cirrhosis, aortic stenosis with AVR, Sjogren's syndrome and OSA on CPAP.  She does not have any evidence for interstitial lung disease or pulmonary hypertension in the setting of her Sjogren's. At her last visit I started her on Stiolto to see if she would get any benefit.  Today she reports that she definitely noticed a benefit, better breathing, better able to do chores around the house. The impact seemed to be better initially - although more recently she is having some increased exertional SOB w walking. She has gained 10 lbs since last visit here 10/2020. Also some anxiety component? ? Whether this is a response to or cause of her dyspnea. She is no longer exercising like she was when she did pulm rehab.  She has chronic cough with contribution from chronic rhinitis, on fluticasone nasal spray She did not start lunesta, although hepatology did tell her that she could try it.   ROV 01/18/22 --follow-up visit for 66 year old woman with restrictive lung disease due to elevated hemidiaphragm and multifactorial shortness of breath due to this as well as mild obstructive lung disease on PFT.  She also has a history of [place] cirrhosis, AAS with AVR, Sjogren syndrome and OSA on CPAP, chronic cough in the setting of rhinitis. Currently managed on Stiolto, uses albuterol very rarely. She reports stable breathing, is definitely benefiting from [employer]. Her cough has been flaring for 2 months. No real PND, no GERD sx. Some throat clearing, a lot of dryness from her Sjogren's.  She is wearing her CPAP reliably.    Review of Systems As per HPI     Objective:    Physical Exam Vitals:   01/18/22 1013  BP: 134/76  Pulse: 77  Temp: 98.3 F (36.8 C)  TempSrc: Oral  SpO2: 97%  Weight: 218 lb 3.2 oz (99 kg)  Height: 5' 4"  (1.626 m)    Gen: Pleasant, overwt woman, in no distress,  somewhat depressed affect  ENT: No lesions,  mouth clear,  oropharynx clear, no postnasal drip  Neck: No JVD, no stridor  Lungs: No use of accessory muscles, clear, better BS on the L than on the R  Cardiovascular: RRR, late systolic M with intact S1 and S2  Musculoskeletal: No deformities, no cyanosis or clubbing  Neuro: alert, awake, non focal  Skin: Warm, no lesions or rash      Assessment & Plan:  Chronic cough No real congestion or drainage.  No overt GERD symptoms.  I suspect this is dryness in the upper airway due to her Sjogren's.  She does have ups and downs with the Sjogren's activity.  Discussed possibly starting an empiric PPI to see if she gets any benefit, change in the cough burden.  She does have fluticasone nasal spray to use if her PND becomes active.  COPD (chronic obstructive pulmonary disease) ([place]) Please continue your Stiolto 2 puffs once daily. Keep albuterol available to use 2 puffs if needed for shortness of breath, chest tightness, wheezing.  We will refill this for you today.  OSA (obstructive sleep apnea) Continue CPAP nightly.  Good compliance.  Follows with Dr. Ihor Austin, MD,  PhD 01/18/2022, 10:44 AM [facility] [phone] or if no answer [phone]

## 2022-01-18 NOTE — Assessment & Plan Note (Signed)
Please continue your Stiolto 2 puffs once daily. Keep albuterol available to use 2 puffs if needed for shortness of breath, chest tightness, wheezing.  We will refill this for you today.

## 2022-01-18 NOTE — Assessment & Plan Note (Signed)
No real congestion or drainage.  No overt GERD symptoms.  I suspect this is dryness in the upper airway due to her Sjogren's.  She does have ups and downs with the Sjogren's activity.  Discussed possibly starting an empiric PPI to see if she gets any benefit, change in the cough burden.  She does have fluticasone nasal spray to use if her PND becomes active.

## 2022-01-23 DIAGNOSIS — R252 Cramp and spasm: Secondary | ICD-10-CM | POA: Diagnosis not present

## 2022-01-23 DIAGNOSIS — G629 Polyneuropathy, unspecified: Secondary | ICD-10-CM | POA: Diagnosis not present

## 2022-01-23 DIAGNOSIS — E1140 Type 2 diabetes mellitus with diabetic neuropathy, unspecified: Secondary | ICD-10-CM | POA: Diagnosis not present

## 2022-01-23 DIAGNOSIS — K7460 Unspecified cirrhosis of liver: Secondary | ICD-10-CM | POA: Diagnosis not present

## 2022-03-01 DIAGNOSIS — R209 Unspecified disturbances of skin sensation: Secondary | ICD-10-CM | POA: Diagnosis not present

## 2022-03-01 DIAGNOSIS — E1169 Type 2 diabetes mellitus with other specified complication: Secondary | ICD-10-CM | POA: Diagnosis not present

## 2022-03-01 DIAGNOSIS — M25552 Pain in left hip: Secondary | ICD-10-CM | POA: Diagnosis not present

## 2022-03-01 DIAGNOSIS — R799 Abnormal finding of blood chemistry, unspecified: Secondary | ICD-10-CM | POA: Diagnosis not present

## 2022-03-01 DIAGNOSIS — Z6837 Body mass index (BMI) 37.0-37.9, adult: Secondary | ICD-10-CM | POA: Diagnosis not present

## 2022-03-01 DIAGNOSIS — G629 Polyneuropathy, unspecified: Secondary | ICD-10-CM | POA: Diagnosis not present

## 2022-03-02 ENCOUNTER — Telehealth: Payer: Self-pay | Admitting: Hematology and Oncology

## 2022-03-02 NOTE — Telephone Encounter (Signed)
Scheduled appt per 10/27 referral. Pt is aware of appt date and time. Pt is aware to arrive 15 mins prior to appt time and to bring and updated insurance card. Pt is aware of appt location.

## 2022-03-06 DIAGNOSIS — M25552 Pain in left hip: Secondary | ICD-10-CM | POA: Diagnosis not present

## 2022-03-14 DIAGNOSIS — H2513 Age-related nuclear cataract, bilateral: Secondary | ICD-10-CM | POA: Diagnosis not present

## 2022-03-14 DIAGNOSIS — M3501 Sicca syndrome with keratoconjunctivitis: Secondary | ICD-10-CM | POA: Diagnosis not present

## 2022-03-20 ENCOUNTER — Ambulatory Visit: Payer: Medicare PPO | Admitting: Pulmonary Disease

## 2022-03-21 ENCOUNTER — Inpatient Hospital Stay: Payer: Medicare PPO | Attending: Hematology and Oncology | Admitting: Hematology and Oncology

## 2022-03-21 ENCOUNTER — Inpatient Hospital Stay: Payer: Medicare PPO

## 2022-03-21 VITALS — BP 129/78 | HR 71 | Temp 97.3°F | Resp 17 | Wt 217.2 lb

## 2022-03-21 DIAGNOSIS — Z825 Family history of asthma and other chronic lower respiratory diseases: Secondary | ICD-10-CM

## 2022-03-21 DIAGNOSIS — Z8269 Family history of other diseases of the musculoskeletal system and connective tissue: Secondary | ICD-10-CM | POA: Diagnosis not present

## 2022-03-21 DIAGNOSIS — Z79899 Other long term (current) drug therapy: Secondary | ICD-10-CM | POA: Diagnosis not present

## 2022-03-21 DIAGNOSIS — Z8249 Family history of ischemic heart disease and other diseases of the circulatory system: Secondary | ICD-10-CM | POA: Diagnosis not present

## 2022-03-21 DIAGNOSIS — Z823 Family history of stroke: Secondary | ICD-10-CM

## 2022-03-21 DIAGNOSIS — D696 Thrombocytopenia, unspecified: Secondary | ICD-10-CM | POA: Diagnosis not present

## 2022-03-21 DIAGNOSIS — Z818 Family history of other mental and behavioral disorders: Secondary | ICD-10-CM | POA: Diagnosis not present

## 2022-03-21 LAB — CMP (CANCER CENTER ONLY)
ALT: 12 U/L (ref 0–44)
AST: 27 U/L (ref 15–41)
Albumin: 3.2 g/dL — ABNORMAL LOW (ref 3.5–5.0)
Alkaline Phosphatase: 59 U/L (ref 38–126)
Anion gap: 5 (ref 5–15)
BUN: 19 mg/dL (ref 8–23)
CO2: 28 mmol/L (ref 22–32)
Calcium: 8.9 mg/dL (ref 8.9–10.3)
Chloride: 106 mmol/L (ref 98–111)
Creatinine: 0.84 mg/dL (ref 0.44–1.00)
GFR, Estimated: >60 mL/min (ref >60)
Glucose, Bld: 83 mg/dL (ref 70–99)
Potassium: 4.4 mmol/L (ref 3.5–5.1)
Sodium: 139 mmol/L (ref 135–145)
Total Bilirubin: 1.3 mg/dL — ABNORMAL HIGH (ref 0.3–1.2)
Total Protein: 6.8 g/dL (ref 6.5–8.1)

## 2022-03-21 LAB — CBC WITH DIFFERENTIAL (CANCER CENTER ONLY)
Abs Immature Granulocytes: 0 10*3/uL (ref 0.00–0.07)
Basophils Absolute: 0 10*3/uL (ref 0.0–0.1)
Basophils Relative: 1 %
Eosinophils Absolute: 0.1 10*3/uL (ref 0.0–0.5)
Eosinophils Relative: 3 %
HCT: 43.3 % (ref 36.0–46.0)
Hemoglobin: 14.7 g/dL (ref 12.0–15.0)
Immature Granulocytes: 0 %
Lymphocytes Relative: 49 %
Lymphs Abs: 1.3 10*3/uL (ref 0.7–4.0)
MCH: 31.7 pg (ref 26.0–34.0)
MCHC: 33.9 g/dL (ref 30.0–36.0)
MCV: 93.5 fL (ref 80.0–100.0)
Monocytes Absolute: 0.3 10*3/uL (ref 0.1–1.0)
Monocytes Relative: 11 %
Neutro Abs: 0.9 10*3/uL — ABNORMAL LOW (ref 1.7–7.7)
Neutrophils Relative %: 36 %
Platelet Count: 66 10*3/uL — ABNORMAL LOW (ref 150–400)
RBC: 4.63 MIL/uL (ref 3.87–5.11)
RDW: 14 % (ref 11.5–15.5)
Smear Review: NORMAL
WBC Count: 2.6 10*3/uL — ABNORMAL LOW (ref 4.0–10.5)
nRBC: 0 % (ref 0.0–0.2)

## 2022-03-21 LAB — HIV ANTIBODY (ROUTINE TESTING W REFLEX): HIV Screen 4th Generation wRfx: NONREACTIVE

## 2022-03-21 LAB — FOLATE: Folate: 8.8 ng/mL (ref >5.9)

## 2022-03-21 LAB — IMMATURE PLATELET FRACTION: Immature Platelet Fraction: 5.5 % (ref 1.2–8.6)

## 2022-03-21 LAB — VITAMIN B12: Vitamin B-12: 836 pg/mL (ref 180–914)

## 2022-03-21 LAB — IRON AND IRON BINDING CAPACITY (CC-WL,HP ONLY)
Iron: 110 ug/dL (ref 28–170)
Saturation Ratios: 33 % — ABNORMAL HIGH (ref 10.4–31.8)
TIBC: 337 ug/dL (ref 250–450)
UIBC: 227 ug/dL

## 2022-03-21 NOTE — Progress Notes (Signed)
[place] [phone]   Fax:([phone]) [place] NOTE  Patient Care Team: Lawerance Cruel, MD as PCP - General (Family Medicine) [name], MD as PCP - Cardiology (Cardiology) Neldon Labella, RN as Case Manager  Hematological/Oncological History # Leukopenia/Thrombocytopenia in Setting of Cirrhosis 03/21/2022: establish care with Dr. Lorenso Courier   CHIEF COMPLAINTS/PURPOSE OF CONSULTATION:  "Leukopenia/Thrombocytopenia "  HISTORY OF PRESENTING ILLNESS:  Whitney [name] 66 y.o. female with medical history significant for NASH cirrhosis, obesity s/p gastric sleeve, and aortic valve replacement presents for evaluation of leukopenia/neutropenia.   On review of the previous records Whitney [name] was diagnosed with NASH cirrhosis in May 2014. She follows with [facility], with her last visit on 01/23/2022.  She had labs collected on 12/17/2018 which showed hemoglobin of 14.9, white blood cell count 3.6, and platelets of 79.  Due to concern for her leukopenia and thrombocytopenia she was referred to hematology for further evaluation and management.  On exam today Whitney [name] reports that she underwent a gastric bypass surgery in 2015 at [facility].  She reports she weighed 284 pounds before the procedure.  She unfortunately increased her weight 35 pounds during the course of COVID.  She has never had a COVID infection.  She reports that she has no issues with recurrent infections and has had no cold in 20 some years.  She has been vaccinated appropriately.  She does note some occasional bruising but no overt signs of bleeding, dark stools, or gum bleeding.  She reports she does enjoy eating red meat at least once per week.  Her energy is okay but she has awful Sjogren's disease which slows her down.  On further discussion she notes that she is a never smoker and drinks alcohol approximately once or twice per year.  She worked as a  Programmer, multimedia for [employer].  Her mother had a history of Karlene Lineman and her father had Alzheimer's disease.  She has a son who had a gastric sleeve and a heart attack.  Otherwise she currently denies any fevers, chills, sweats, nausea, or diarrhea.  A full 10 point ROS is otherwise negative.  MEDICAL HISTORY:  Past Medical History:  Diagnosis Date   Allergic rhinitis    Anemia    hx   Arthritis    Asthma    hx yrs ago   Cirrhosis ([place]) last albumin 3.3 done at [place] 06-16-2014 (under care everywhere tab in epic)   Secondary to Fatty liver --  followed by hepatology at [place] (dr Gerald Dexter)    Depression    Diabetes mellitus type II    type 2 diet conrolled   Dyspnea    Fibromyalgia    GERD (gastroesophageal reflux disease)    Heart murmur    asymptomatic ---  1989 from rhuematic fever   History of exercise stress test    05-05-2013---  negative bruce ETT given exercise workload,  no ischemia   History of hiatal hernia    History of kidney stones    History of rheumatic fever    1989   Hyperlipidemia    Leukocytopenia    Moderate aortic stenosis    AVA area 1.1cm2---  cardiologist --  dr Concepcion Living, 2014 in epic   NASH (nonalcoholic steatohepatitis)    OSA (obstructive sleep apnea)    was using CPAP before gastric sleeve 2015--  no uses after wt loss   Pneumonia    hx  Sjogren's syndrome ([place])    Thrombocytopenia ([place])     SURGICAL HISTORY: Past Surgical History:  Procedure Laterality Date   AORTIC VALVE REPLACEMENT N/A 12/05/2017   Procedure: AORTIC VALVE REPLACEMENT (AVR) TISSUE VALVE 21MM INSPIRIS;  Surgeon: Gaye Pollack, MD;  Location: [place];  Service: Open Heart Surgery;  Laterality: N/A;   COLONOSCOPY WITH ESOPHAGOGASTRODUODENOSCOPY (EGD)     CYSTOSCOPY WITH RETROGRADE PYELOGRAM, URETEROSCOPY AND STENT PLACEMENT Left 01/21/2015   Procedure: CYSTOSCOPY WITH LEFT  RETROGRADE PYELOGRAM, LEFT URETEROSCOPY AND STENT PLACEMENT;  Surgeon: Festus Aloe, MD;   Location: [facility];  Service: Urology;  Laterality: Left;   CYSTOSCOPY WITH RETROGRADE PYELOGRAM, URETEROSCOPY AND STENT PLACEMENT Bilateral 02/24/2015   Procedure: CYSTOSCOPY WITH RIGHT RETROGRADE PYELOGRAM, BLADDER BIOPSY FULGERATION LEFT URETEROSCOPY AND STENT REPLACEMENT;  Surgeon: Festus Aloe, MD;  Location: [facility];  Service: Urology;  Laterality: Bilateral;   EXPLORATORY LAPAROSCOPY W/  CONE BIOPSY'S LEFT AND RIGHT LOBE OF LIVER  11-04-2007   HOLMIUM LASER APPLICATION Left 07/26/2246   Procedure: HOLMIUM LASER LITHOTRIPSY;  Surgeon: Festus Aloe, MD;  Location: [facility];  Service: Urology;  Laterality: Left;   HYSTEROSCOPY WITH D & C N/A 12/11/2012   Procedure: DILATATION AND CURETTAGE /HYSTEROSCOPY;  Surgeon: Maeola Sarah. Landry Mellow, MD;  Location: [facility];  Service: Gynecology;  Laterality: N/A;   INGUINAL HERNIA REPAIR Left 10/22/2016   Procedure: LEFT INGUINAL HERNIA REPAIR;  Surgeon: Rolm Bookbinder, MD;  Location: [place];  Service: General;  Laterality: Left;  TAP BLOCK   INSERTION OF MESH Left 10/22/2016   Procedure: INSERTION OF MESH;  Surgeon: Rolm Bookbinder, MD;  Location: [place];  Service: General;  Laterality: Left;   LAPAROSCOPIC GASTRIC SLEEVE RESECTION  07-27-2013   PUBOVAGINAL SLING  04-10-2001   [place]   RIGHT/LEFT HEART CATH AND CORONARY ANGIOGRAPHY N/A 08/23/2017   Procedure: RIGHT/LEFT HEART CATH AND CORONARY ANGIOGRAPHY;  Surgeon: [name], MD;  Location: [facility];  Service: Cardiovascular;  Laterality: N/A;   TEE WITHOUT CARDIOVERSION N/A 12/05/2017   Procedure: TRANSESOPHAGEAL ECHOCARDIOGRAM (TEE);  Surgeon: Gaye Pollack, MD;  Location: [place] [place];  Service: Open Heart Surgery;  Laterality: N/A;   TONSILLECTOMY  1975   TRANSTHORACIC ECHOCARDIOGRAM  06-04-2012  dr Irish Lack   mild LVH,  grade I diastolic dysfunction/  ef 60-65%/  moderate LAE/  mild MV calcifation without stenosis,  mild MR/   moderate AV stenosis,  cannot r/o bicupsid, area 1.1cm2/  mild dilated aortic root/  trivial TR    SOCIAL HISTORY: Social History   Socioeconomic History   Marital status: Widowed    Spouse name: Married to [employer]   Number of children: Not on file   Years of education: Not on file   Highest education level: Not on file  Occupational History   Occupation: principal of [employer]  Tobacco Use   Smoking status: Never   Smokeless tobacco: Never  Vaping Use   Vaping Use: Never used  Substance and Sexual Activity   Alcohol use: Yes    Comment: rarely   Drug use: No   Sexual activity: Not on file  Other Topics Concern   Not on file  Social History Narrative   Not on file   Social Determinants of Health   Financial Resource Strain: Not on file  Food Insecurity: No Food Insecurity (12/08/2021)   Hunger Vital Sign    Worried About Running Out of Food in the Last Year: Never  true    Ran Out of Food in the Last Year: Never true  Transportation Needs: No Transportation Needs (12/08/2021)   PRAPARE - Hydrologist (Medical): No    Lack of Transportation (Non-Medical): No  Physical Activity: Not on file  Stress: Not on file  Social Connections: Not on file  Intimate Partner Violence: Not on file    FAMILY HISTORY: Family History  Problem Relation Age of Onset   Alzheimer's disease Father    Hip fracture Father    Asthma Father    Heart disease Father    Heart attack Father    Hypertension Father    Rheum arthritis Mother    Heart disease Mother    Allergies Daughter    Asthma Paternal Grandmother    Asthma Daughter    Stroke Neg Hx     ALLERGIES:  is allergic to doxycycline and naproxen.  MEDICATIONS:  Current Outpatient Medications  Medication Sig Dispense Refill   magnesium oxide (MAG-OX) 400 MG tablet Take 400 mg by mouth daily.     XIIDRA 5 % SOLN Apply to eye.     amoxicillin (AMOXIL) 500 MG capsule Take 4 capsules 1 hour prior  to any dental procedures 4 capsule 3   ezetimibe (ZETIA) 10 MG tablet Take 10 mg by mouth at bedtime.   11   milk thistle 175 MG tablet Take 175 mg by mouth daily.     nadolol (CORGARD) 20 MG tablet Take 20 mg by mouth daily.     OZEMPIC, 0.25 OR 0.5 MG/DOSE, 2 MG/3ML SOPN Inject into the skin. (Patient not taking: Reported on 03/21/2022)     PROAIR HFA 108 (90 Base) MCG/ACT inhaler INHALE 2 PUFFS INTO THE LUNGS EVERY 4 (FOUR) HOURS AS NEEDED FOR WHEEZING OR SHORTNESS OF BREATH. 8.5 each 5   STIOLTO RESPIMAT 2.5-2.5 MCG/ACT AERS INHALE 2 PUFFS BY MOUTH INTO THE LUNGS DAILY 4 g 5   No current facility-administered medications for this visit.    REVIEW OF SYSTEMS:   Constitutional: ( - ) fevers, ( - )  chills , ( - ) night sweats Eyes: ( - ) blurriness of vision, ( - ) double vision, ( - ) watery eyes Ears, nose, mouth, throat, and face: ( - ) mucositis, ( - ) sore throat Respiratory: ( - ) cough, ( - ) dyspnea, ( - ) wheezes Cardiovascular: ( - ) palpitation, ( - ) chest discomfort, ( - ) lower extremity swelling Gastrointestinal:  ( - ) nausea, ( - ) heartburn, ( - ) change in bowel habits Skin: ( - ) abnormal skin rashes Lymphatics: ( - ) new lymphadenopathy, ( - ) easy bruising Neurological: ( - ) numbness, ( - ) tingling, ( - ) new weaknesses Behavioral/Psych: ( - ) mood change, ( - ) new changes  All other systems were reviewed with the patient and are negative.  PHYSICAL EXAMINATION:  Vitals:   03/21/22 1320  BP: 129/78  Pulse: 71  Resp: 17  Temp: (!) 97.3 F (36.3 C)  SpO2: 100%   Filed Weights   03/21/22 1320  Weight: 217 lb 3.2 oz (98.5 kg)    GENERAL: well appearing middle-aged Caucasian female in NAD  SKIN: skin color, texture, turgor are normal, no rashes or significant lesions EYES: conjunctiva are pink and non-injected, sclera clear LUNGS: clear to auscultation and percussion with normal breathing effort HEART: regular rate & rhythm and no murmurs and no lower  extremity  edema Musculoskeletal: no cyanosis of digits and no clubbing  PSYCH: alert & oriented x 3, fluent speech NEURO: no focal motor/sensory deficits  LABORATORY DATA:  I have reviewed the data as listed    Latest Ref Rng & Units 12/17/2018    3:39 PM 12/17/2018    3:32 PM 01/24/2018   11:48 AM  CBC  WBC 4.0 - 10.5 K/uL  3.6  3.4   Hemoglobin 12.0 - 15.0 g/dL 16.0  14.9  12.5   Hematocrit 36.0 - 46.0 % 47.0  46.1  38.5   Platelets 150 - 400 K/uL  79  93        Latest Ref Rng & Units 12/17/2018    3:39 PM 12/17/2018    3:32 PM 01/24/2018   11:48 AM  CMP  Glucose 70 - 99 mg/dL 74  80  90   BUN 8 - 23 mg/dL _0 Creatinine 0.44 - 1.00 mg/dL 0.80  0.93  0.84   Sodium 135 - 145 mmol/L 141  139  139   Potassium 3.5 - 5.1 mmol/L 3.9  4.2  4.4   Chloride 98 - 111 mmol/L 104  103  102   CO2 22 - 32 mmol/L  27  25   Calcium 8.9 - 10.3 mg/dL  9.1  8.9   Total Protein 6.5 - 8.1 g/dL  6.6    Total Bilirubin 0.3 - 1.2 mg/dL  1.4    Alkaline Phos 38 - 126 U/L  68    AST 15 - 41 U/L  39    ALT 0 - 44 U/L  13       ASSESSMENT & PLAN Laren Everts Parkey 66 y.o. female with medical history significant for NASH cirrhosis, obesity s/p gastric sleeve, and aortic valve replacement presents for evaluation of leukopenia/neutropenia.   After review of the labs, review of the records, and discussion with the patient the patients findings are most consistent with neutropenia/thrombocytopenia 2/2 to cirrhosis.   Thrombocytopenia is a common condition with a broad differential. The possible etiologies of thrombocytopenia include liver disease, splenomegaly, infectious process, nutritional deficiency, consumption/autoimmune destruction, pseudothrombocytopenia, and bone marrow disorders. Cirrhosis and liver disease the most common causes of moderate thrombocytopenia. Evaluation should include full hepatitis serologies (Hep B and C) as well as HIV. Imaging of the liver spleen should be performed with  an abdominal US ( if prior imaging is no readily available). Nutritional etiologies should be ruled out with Vitamin b12 and folate testing.  A peripheral blood smear can help determine if there is clumping leading to pseudothrombocytopenia. If no clear etiology can be found would need to consider immune thrombocytopenia (ITP) with consideration of bone marrow biopsy.   # Neutropenia/Thrombocytopenia  --will order CBC, CMP, Vitamin b12, folate and MMA. --due to gastric bypass will also assess iron panel/ferritin.  --will review peripheral blood film to r/o clumping  --viral serologies with HIV. Hepatitis serologies previously reviewed by [facility] --continue to follow with [facility] for cirrhosis.  --RTC if there are any concerning findings in the above workup.    Orders Placed This Encounter  Procedures   CBC with Differential ([place] Only)    Standing Status:   Future    Number of Occurrences:   1    Standing Expiration Date:   03/22/2023   CMP ([place] only)    Standing Status:   Future    Number of Occurrences:   1  Standing Expiration Date:   03/22/2023   Ferritin    Standing Status:   Future    Number of Occurrences:   1    Standing Expiration Date:   03/22/2023   Iron and Iron Binding Capacity ([facility] only)    Standing Status:   Future    Number of Occurrences:   1    Standing Expiration Date:   03/22/2023   Immature Platelet Fraction    Standing Status:   Future    Number of Occurrences:   1    Standing Expiration Date:   03/22/2023   Vitamin B12    Standing Status:   Future    Number of Occurrences:   1    Standing Expiration Date:   03/21/2023   Methylmalonic acid, serum    Standing Status:   Future    Number of Occurrences:   1    Standing Expiration Date:   03/21/2023   Folate, Serum    Standing Status:   Future    Number of Occurrences:   1    Standing Expiration Date:   03/21/2023   HIV antibody (with reflex)    Standing Status:   Future     Number of Occurrences:   1    Standing Expiration Date:   03/21/2023    All questions were answered. The patient knows to call the clinic with any problems, questions or concerns.  A total of more than 60 minutes were spent on this encounter with face-to-face time and non-face-to-face time, including preparing to see the patient, ordering tests and/or medications, counseling the patient and coordination of care as outlined above.   Ledell Peoples, MD Department of Hematology/Oncology [place] [facility] Phone: [phone] Pager: [phone] Email: [email or web address]  03/21/2022 2:20 PM

## 2022-03-22 LAB — FERRITIN: Ferritin: 94 ng/mL (ref 11–307)

## 2022-03-25 LAB — METHYLMALONIC ACID, SERUM: Methylmalonic Acid, Quantitative: 202 nmol/L (ref 0–378)

## 2022-04-10 ENCOUNTER — Telehealth: Payer: Self-pay

## 2022-04-10 NOTE — Telephone Encounter (Signed)
Patient called and provided with the following information related to her recent lab results. Patient verbalized an understanding of the information and was appreciative of the call.

## 2022-04-10 NOTE — Telephone Encounter (Signed)
-----   Message from Otila Kluver, RN sent at 04/09/2022  5:42 PM EST -----  ----- Message ----- From: Orson Slick, MD Sent: 04/08/2022   4:17 PM EST To: Otila Kluver, RN  Please let Whitney [name] know that her labs show no concerning abnormalities.  At this time the most likely cause for her leukopenia and thrombocytopenia is her cirrhosis of the liver.  Recommend she continue to follow with [facility].  We are happy to see her back if she would develop any new or worsening blood issues. ----- Message ----- From: Buel Ream, Lab [facility] [place] Sent: 03/21/2022   3:12 PM EST To: Orson Slick, MD

## 2022-04-13 ENCOUNTER — Emergency Department (HOSPITAL_COMMUNITY): Payer: Medicare PPO

## 2022-04-13 ENCOUNTER — Encounter (HOSPITAL_COMMUNITY): Payer: Self-pay

## 2022-04-13 ENCOUNTER — Other Ambulatory Visit: Payer: Self-pay

## 2022-04-13 ENCOUNTER — Inpatient Hospital Stay (HOSPITAL_COMMUNITY)
Admission: EM | Admit: 2022-04-13 | Discharge: 2022-04-16 | DRG: 041 | Disposition: A | Discharge status: Home Health | Payer: Medicare PPO | Attending: Internal Medicine | Admitting: Internal Medicine

## 2022-04-13 DIAGNOSIS — Z820 Family history of epilepsy and other diseases of the nervous system: Secondary | ICD-10-CM

## 2022-04-13 DIAGNOSIS — Z952 Presence of prosthetic heart valve: Secondary | ICD-10-CM | POA: Diagnosis not present

## 2022-04-13 DIAGNOSIS — Z87442 Personal history of urinary calculi: Secondary | ICD-10-CM

## 2022-04-13 DIAGNOSIS — Z6837 Body mass index (BMI) 37.0-37.9, adult: Secondary | ICD-10-CM

## 2022-04-13 DIAGNOSIS — Z881 Allergy status to other antibiotic agents status: Secondary | ICD-10-CM

## 2022-04-13 DIAGNOSIS — R509 Fever, unspecified: Secondary | ICD-10-CM | POA: Diagnosis not present

## 2022-04-13 DIAGNOSIS — E669 Obesity, unspecified: Secondary | ICD-10-CM | POA: Diagnosis present

## 2022-04-13 DIAGNOSIS — Z79899 Other long term (current) drug therapy: Secondary | ICD-10-CM

## 2022-04-13 DIAGNOSIS — J90 Pleural effusion, not elsewhere classified: Secondary | ICD-10-CM | POA: Diagnosis not present

## 2022-04-13 DIAGNOSIS — K219 Gastro-esophageal reflux disease without esophagitis: Secondary | ICD-10-CM | POA: Diagnosis present

## 2022-04-13 DIAGNOSIS — M797 Fibromyalgia: Secondary | ICD-10-CM | POA: Diagnosis present

## 2022-04-13 DIAGNOSIS — I6389 Other cerebral infarction: Secondary | ICD-10-CM | POA: Diagnosis not present

## 2022-04-13 DIAGNOSIS — R0602 Shortness of breath: Secondary | ICD-10-CM

## 2022-04-13 DIAGNOSIS — J849 Interstitial pulmonary disease, unspecified: Secondary | ICD-10-CM | POA: Diagnosis not present

## 2022-04-13 DIAGNOSIS — Z825 Family history of asthma and other chronic lower respiratory diseases: Secondary | ICD-10-CM

## 2022-04-13 DIAGNOSIS — D72819 Decreased white blood cell count, unspecified: Secondary | ICD-10-CM | POA: Diagnosis present

## 2022-04-13 DIAGNOSIS — R202 Paresthesia of skin: Secondary | ICD-10-CM

## 2022-04-13 DIAGNOSIS — H532 Diplopia: Secondary | ICD-10-CM | POA: Diagnosis not present

## 2022-04-13 DIAGNOSIS — R531 Weakness: Secondary | ICD-10-CM | POA: Diagnosis not present

## 2022-04-13 DIAGNOSIS — D6959 Other secondary thrombocytopenia: Secondary | ICD-10-CM | POA: Diagnosis present

## 2022-04-13 DIAGNOSIS — Z8673 Personal history of transient ischemic attack (TIA), and cerebral infarction without residual deficits: Secondary | ICD-10-CM | POA: Diagnosis present

## 2022-04-13 DIAGNOSIS — E785 Hyperlipidemia, unspecified: Secondary | ICD-10-CM | POA: Diagnosis present

## 2022-04-13 DIAGNOSIS — E119 Type 2 diabetes mellitus without complications: Secondary | ICD-10-CM | POA: Diagnosis present

## 2022-04-13 DIAGNOSIS — K7469 Other cirrhosis of liver: Secondary | ICD-10-CM | POA: Diagnosis present

## 2022-04-13 DIAGNOSIS — K7581 Nonalcoholic steatohepatitis (NASH): Secondary | ICD-10-CM | POA: Diagnosis present

## 2022-04-13 DIAGNOSIS — J986 Disorders of diaphragm: Secondary | ICD-10-CM | POA: Diagnosis not present

## 2022-04-13 DIAGNOSIS — J9811 Atelectasis: Secondary | ICD-10-CM | POA: Diagnosis present

## 2022-04-13 DIAGNOSIS — I63541 Cerebral infarction due to unspecified occlusion or stenosis of right cerebellar artery: Secondary | ICD-10-CM | POA: Diagnosis not present

## 2022-04-13 DIAGNOSIS — J45909 Unspecified asthma, uncomplicated: Secondary | ICD-10-CM | POA: Diagnosis present

## 2022-04-13 DIAGNOSIS — K7460 Unspecified cirrhosis of liver: Secondary | ICD-10-CM | POA: Diagnosis not present

## 2022-04-13 DIAGNOSIS — Z7951 Long term (current) use of inhaled steroids: Secondary | ICD-10-CM

## 2022-04-13 DIAGNOSIS — Z885 Allergy status to narcotic agent status: Secondary | ICD-10-CM

## 2022-04-13 DIAGNOSIS — Z8249 Family history of ischemic heart disease and other diseases of the circulatory system: Secondary | ICD-10-CM

## 2022-04-13 DIAGNOSIS — M3500 Sicca syndrome, unspecified: Secondary | ICD-10-CM | POA: Diagnosis present

## 2022-04-13 DIAGNOSIS — J811 Chronic pulmonary edema: Secondary | ICD-10-CM | POA: Diagnosis not present

## 2022-04-13 DIAGNOSIS — Z8701 Personal history of pneumonia (recurrent): Secondary | ICD-10-CM

## 2022-04-13 DIAGNOSIS — I700 Atherosclerosis of aorta: Secondary | ICD-10-CM | POA: Diagnosis not present

## 2022-04-13 DIAGNOSIS — Z1152 Encounter for screening for COVID-19: Secondary | ICD-10-CM

## 2022-04-13 DIAGNOSIS — M1990 Unspecified osteoarthritis, unspecified site: Secondary | ICD-10-CM | POA: Diagnosis present

## 2022-04-13 DIAGNOSIS — I639 Cerebral infarction, unspecified: Secondary | ICD-10-CM

## 2022-04-13 DIAGNOSIS — I10 Essential (primary) hypertension: Secondary | ICD-10-CM | POA: Diagnosis present

## 2022-04-13 DIAGNOSIS — Z7985 Long-term (current) use of injectable non-insulin antidiabetic drugs: Secondary | ICD-10-CM

## 2022-04-13 DIAGNOSIS — Z9884 Bariatric surgery status: Secondary | ICD-10-CM | POA: Diagnosis not present

## 2022-04-13 DIAGNOSIS — D649 Anemia, unspecified: Secondary | ICD-10-CM | POA: Diagnosis not present

## 2022-04-13 DIAGNOSIS — K110 Atrophy of salivary gland: Secondary | ICD-10-CM | POA: Diagnosis not present

## 2022-04-13 DIAGNOSIS — R55 Syncope and collapse: Secondary | ICD-10-CM | POA: Diagnosis not present

## 2022-04-13 DIAGNOSIS — I4891 Unspecified atrial fibrillation: Secondary | ICD-10-CM | POA: Diagnosis not present

## 2022-04-13 DIAGNOSIS — F32A Depression, unspecified: Secondary | ICD-10-CM | POA: Diagnosis present

## 2022-04-13 DIAGNOSIS — I350 Nonrheumatic aortic (valve) stenosis: Secondary | ICD-10-CM | POA: Diagnosis present

## 2022-04-13 DIAGNOSIS — G629 Polyneuropathy, unspecified: Secondary | ICD-10-CM | POA: Diagnosis not present

## 2022-04-13 DIAGNOSIS — I2510 Atherosclerotic heart disease of native coronary artery without angina pectoris: Secondary | ICD-10-CM | POA: Diagnosis not present

## 2022-04-13 DIAGNOSIS — I672 Cerebral atherosclerosis: Secondary | ICD-10-CM | POA: Diagnosis not present

## 2022-04-13 DIAGNOSIS — Z8261 Family history of arthritis: Secondary | ICD-10-CM

## 2022-04-13 DIAGNOSIS — R29700 NIHSS score 0: Secondary | ICD-10-CM | POA: Diagnosis present

## 2022-04-13 LAB — I-STAT VENOUS BLOOD GAS, ED
Acid-Base Excess: 0 mmol/L (ref 0.0–2.0)
Bicarbonate: 27.4 mmol/L (ref 20.0–28.0)
Calcium, Ion: 1.14 mmol/L — ABNORMAL LOW (ref 1.15–1.40)
HCT: 39 % (ref 36.0–46.0)
Hemoglobin: 13.3 g/dL (ref 12.0–15.0)
O2 Saturation: 49 %
Potassium: 3.7 mmol/L (ref 3.5–5.1)
Sodium: 141 mmol/L (ref 135–145)
TCO2: 29 mmol/L (ref 22–32)
pCO2, Ven: 53.5 mmHg (ref 44–60)
pH, Ven: 7.317 (ref 7.25–7.43)
pO2, Ven: 29 mmHg — CL (ref 32–45)

## 2022-04-13 LAB — CBC
HCT: 40.1 % (ref 36.0–46.0)
HCT: 41.3 % (ref 36.0–46.0)
Hemoglobin: 13.1 g/dL (ref 12.0–15.0)
Hemoglobin: 13.4 g/dL (ref 12.0–15.0)
MCH: 30.8 pg (ref 26.0–34.0)
MCH: 30.5 pg (ref 26.0–34.0)
MCHC: 32.7 g/dL (ref 30.0–36.0)
MCHC: 32.4 g/dL (ref 30.0–36.0)
MCV: 94.4 fL (ref 80.0–100.0)
MCV: 94.1 fL (ref 80.0–100.0)
Platelets: 76 10*3/uL — ABNORMAL LOW (ref 150–400)
Platelets: 77 10*3/uL — ABNORMAL LOW (ref 150–400)
RBC: 4.25 MIL/uL (ref 3.87–5.11)
RBC: 4.39 MIL/uL (ref 3.87–5.11)
RDW: 13.9 % (ref 11.5–15.5)
RDW: 13.7 % (ref 11.5–15.5)
WBC: 2.3 10*3/uL — ABNORMAL LOW (ref 4.0–10.5)
WBC: 3 10*3/uL — ABNORMAL LOW (ref 4.0–10.5)
nRBC: 0 % (ref 0.0–0.2)
nRBC: 0 % (ref 0.0–0.2)

## 2022-04-13 LAB — COMPREHENSIVE METABOLIC PANEL
ALT: 12 U/L (ref 0–44)
AST: 31 U/L (ref 15–41)
Albumin: 2.4 g/dL — ABNORMAL LOW (ref 3.5–5.0)
Alkaline Phosphatase: 55 U/L (ref 38–126)
Anion gap: 7 (ref 5–15)
BUN: 14 mg/dL (ref 8–23)
CO2: 28 mmol/L (ref 22–32)
Calcium: 8.2 mg/dL — ABNORMAL LOW (ref 8.9–10.3)
Chloride: 104 mmol/L (ref 98–111)
Creatinine, Ser: 0.88 mg/dL (ref 0.44–1.00)
GFR, Estimated: >60 mL/min (ref >60)
Glucose, Bld: 122 mg/dL — ABNORMAL HIGH (ref 70–99)
Potassium: 3.9 mmol/L (ref 3.5–5.1)
Sodium: 139 mmol/L (ref 135–145)
Total Bilirubin: 0.9 mg/dL (ref 0.3–1.2)
Total Protein: 5.6 g/dL — ABNORMAL LOW (ref 6.5–8.1)

## 2022-04-13 LAB — DIFFERENTIAL
Abs Immature Granulocytes: 0.01 10*3/uL (ref 0.00–0.07)
Basophils Absolute: 0 10*3/uL (ref 0.0–0.1)
Basophils Relative: 0 %
Eosinophils Absolute: 0 10*3/uL (ref 0.0–0.5)
Eosinophils Relative: 2 %
Immature Granulocytes: 0 %
Lymphocytes Relative: 35 %
Lymphs Abs: 0.8 10*3/uL (ref 0.7–4.0)
Monocytes Absolute: 0.3 10*3/uL (ref 0.1–1.0)
Monocytes Relative: 15 %
Neutro Abs: 1.1 10*3/uL — ABNORMAL LOW (ref 1.7–7.7)
Neutrophils Relative %: 48 %

## 2022-04-13 LAB — URINALYSIS, ROUTINE W REFLEX MICROSCOPIC
Bacteria, UA: NONE SEEN
Bilirubin Urine: NEGATIVE
Glucose, UA: NEGATIVE mg/dL
Ketones, ur: NEGATIVE mg/dL
Leukocytes,Ua: NEGATIVE
Nitrite: NEGATIVE
Protein, ur: 100 mg/dL — AB
Specific Gravity, Urine: 1.024 (ref 1.005–1.030)
pH: 5 (ref 5.0–8.0)

## 2022-04-13 LAB — APTT: aPTT: 36 seconds (ref 24–36)

## 2022-04-13 LAB — I-STAT CHEM 8, ED
BUN: 14 mg/dL (ref 8–23)
Calcium, Ion: 1.14 mmol/L — ABNORMAL LOW (ref 1.15–1.40)
Chloride: 102 mmol/L (ref 98–111)
Creatinine, Ser: 0.8 mg/dL (ref 0.44–1.00)
Glucose, Bld: 116 mg/dL — ABNORMAL HIGH (ref 70–99)
HCT: 38 % (ref 36.0–46.0)
Hemoglobin: 12.9 g/dL (ref 12.0–15.0)
Potassium: 3.7 mmol/L (ref 3.5–5.1)
Sodium: 141 mmol/L (ref 135–145)
TCO2: 27 mmol/L (ref 22–32)

## 2022-04-13 LAB — D-DIMER, QUANTITATIVE: D-Dimer, Quant: 1.73 ug/mL-FEU — ABNORMAL HIGH (ref 0.00–0.50)

## 2022-04-13 LAB — MAGNESIUM: Magnesium: 1.8 mg/dL (ref 1.7–2.4)

## 2022-04-13 LAB — RESP PANEL BY RT-PCR (RSV, FLU A&B, COVID)  RVPGX2
Influenza A by PCR: NEGATIVE
Influenza B by PCR: NEGATIVE
Resp Syncytial Virus by PCR: NEGATIVE
SARS Coronavirus 2 by RT PCR: NEGATIVE

## 2022-04-13 LAB — PROTIME-INR
INR: 1.3 — ABNORMAL HIGH (ref 0.8–1.2)
Prothrombin Time: 15.6 seconds — ABNORMAL HIGH (ref 11.4–15.2)

## 2022-04-13 LAB — SEDIMENTATION RATE: Sed Rate: 10 mm/hr (ref 0–22)

## 2022-04-13 LAB — ETHANOL: Alcohol, Ethyl (B): <10 mg/dL (ref <10)

## 2022-04-13 MED ORDER — HEPARIN SODIUM (PORCINE) 5000 UNIT/ML IJ SOLN
2500.0000 [IU] | Freq: Three times a day (TID) | INTRAMUSCULAR | Status: DC
  Administered 2022-04-14 – 2022-04-16 (×7): 2500 [IU] via SUBCUTANEOUS
  Filled 2022-04-13 (×7): qty 1

## 2022-04-13 MED ORDER — CLOPIDOGREL BISULFATE 75 MG PO TABS: 75.0000 mg | ORAL_TABLET | Freq: Every day | ORAL | Status: DC

## 2022-04-13 MED ORDER — ASPIRIN 81 MG PO CHEW
81.0000 mg | CHEWABLE_TABLET | Freq: Every day | ORAL | Status: DC
  Administered 2022-04-13 – 2022-04-16 (×4): 81 mg via ORAL
  Filled 2022-04-13 (×4): qty 1

## 2022-04-13 MED ORDER — UMECLIDINIUM BROMIDE 62.5 MCG/ACT IN AEPB
1.0000 | INHALATION_SPRAY | Freq: Every day | RESPIRATORY_TRACT | Status: DC
  Administered 2022-04-16: 1 via RESPIRATORY_TRACT
  Filled 2022-04-13: qty 7

## 2022-04-13 MED ORDER — EZETIMIBE 10 MG PO TABS
10.0000 mg | ORAL_TABLET | Freq: Every day | ORAL | Status: DC
  Administered 2022-04-14 – 2022-04-15 (×2): 10 mg via ORAL
  Filled 2022-04-13 (×4): qty 1

## 2022-04-13 MED ORDER — ACETAMINOPHEN 160 MG/5ML PO SOLN: 650.0000 mg | ORAL | Status: DC | PRN

## 2022-04-13 MED ORDER — HEPARIN SODIUM (PORCINE) 5000 UNIT/ML IJ SOLN
5000.0000 [IU] | Freq: Three times a day (TID) | INTRAMUSCULAR | Status: DC
  Administered 2022-04-13: 5000 [IU] via SUBCUTANEOUS
  Filled 2022-04-13: qty 1

## 2022-04-13 MED ORDER — ALBUTEROL SULFATE HFA 108 (90 BASE) MCG/ACT IN AERS: 2.0000 | INHALATION_SPRAY | RESPIRATORY_TRACT | Status: DC | PRN

## 2022-04-13 MED ORDER — MECLIZINE HCL 25 MG PO TABS
25.0000 mg | ORAL_TABLET | Freq: Once | ORAL | Status: AC
  Administered 2022-04-13: 25 mg via ORAL
  Filled 2022-04-13: qty 1

## 2022-04-13 MED ORDER — ARFORMOTEROL TARTRATE 15 MCG/2ML IN NEBU
15.0000 ug | INHALATION_SOLUTION | Freq: Two times a day (BID) | RESPIRATORY_TRACT | Status: DC
  Administered 2022-04-13 – 2022-04-16 (×5): 15 ug via RESPIRATORY_TRACT
  Filled 2022-04-13 (×6): qty 2

## 2022-04-13 MED ORDER — CYANOCOBALAMIN 1000 MCG/ML IJ SOLN
1000.0000 ug | Freq: Once | INTRAMUSCULAR | Status: DC
  Filled 2022-04-13 (×3): qty 1

## 2022-04-13 MED ORDER — ALBUTEROL SULFATE (2.5 MG/3ML) 0.083% IN NEBU: 2.5000 mg | INHALATION_SOLUTION | RESPIRATORY_TRACT | Status: DC | PRN

## 2022-04-13 MED ORDER — ACETAMINOPHEN 325 MG PO TABS
650.0000 mg | ORAL_TABLET | ORAL | Status: DC | PRN
  Administered 2022-04-14 (×2): 650 mg via ORAL
  Filled 2022-04-13 (×2): qty 2

## 2022-04-13 MED ORDER — SENNOSIDES-DOCUSATE SODIUM 8.6-50 MG PO TABS: 1.0000 | ORAL_TABLET | Freq: Every evening | ORAL | Status: DC | PRN

## 2022-04-13 MED ORDER — ACETAMINOPHEN 650 MG RE SUPP: 650.0000 mg | RECTAL | Status: DC | PRN

## 2022-04-13 MED ORDER — STROKE: EARLY STAGES OF RECOVERY BOOK
Freq: Once | Status: AC
  Filled 2022-04-13: qty 1

## 2022-04-13 NOTE — ED Notes (Signed)
ED TO INPATIENT HANDOFF REPORT  ED Nurse Name and Phone #: [email or web address], [phone]  S Name/Age/Gender Whitney [name] 66 y.o. female Room/Bed: 009C/009C  Code Status   Code Status: Full Code  Home/SNF/Other Home Patient oriented to: self, place, time, and situation Is this baseline? Yes   Triage Complete: Triage complete  Chief Complaint Acute CVA (cerebrovascular accident) [facility]) [I63.9]  Triage Note Pt reports about 7 or 730 last night she began having double vision, unsteady gait, and tingling in her extremities.  Pt states she went to eat with a friend this morning and had some more feelings of unsteadiness however her vision is no longer blurry but she does feel nauseated.  Triage PA in to evaluate patient.    Allergies Allergies  Allergen Reactions   Doxycycline Hives and Rash   Naproxen Rash and Hives    Level of Care/Admitting Diagnosis ED Disposition     ED Disposition  Admit   Condition  --   Comment  Hospital Area: [place] [[id number]]  Level of Care: Progressive [102]  Admit to Progressive based on following criteria: NEUROLOGICAL AND NEUROSURGICAL complex patients with significant risk of instability, who do not meet ICU criteria, yet require close observation or frequent assessment (< / = every 2 - 4 hours) with medical / nursing intervention.  May admit patient to [facility] or [facility] if equivalent level of care is available:: No  Covid Evaluation: Asymptomatic - no recent exposure (last 10 days) testing not required  Diagnosis: Acute CVA (cerebrovascular accident) [facility]) [[id number]]  Admitting Physician: Clance Boll [[id number]]  Attending Physician: Clance Boll [[id number]]  Certification:: I certify this patient will need inpatient services for at least 2 midnights  Estimated Length of Stay: 3          B Medical/Surgery History Past Medical History:  Diagnosis Date   Allergic rhinitis    Anemia    hx   Arthritis     Asthma    hx yrs ago   Cirrhosis ([place]) last albumin 3.3 done at [place] 06-16-2014 (under care everywhere tab in epic)   Secondary to Fatty liver --  followed by hepatology at [place] (dr Gerald Dexter)    Depression    Diabetes mellitus type II    type 2 diet conrolled   Dyspnea    Fibromyalgia    GERD (gastroesophageal reflux disease)    Heart murmur    asymptomatic ---  1989 from rhuematic fever   History of exercise stress test    05-05-2013---  negative bruce ETT given exercise workload,  no ischemia   History of hiatal hernia    History of kidney stones    History of rheumatic fever    1989   Hyperlipidemia    Leukocytopenia    Moderate aortic stenosis    AVA area 1.1cm2---  cardiologist --  dr Concepcion Living, 2014 in epic   NASH (nonalcoholic steatohepatitis)    OSA (obstructive sleep apnea)    was using CPAP before gastric sleeve 2015--  no uses after wt loss   Pneumonia    hx   Sjogren's syndrome ([place])    Thrombocytopenia ([place])    Past Surgical History:  Procedure Laterality Date   AORTIC VALVE REPLACEMENT N/A 12/05/2017   Procedure: AORTIC VALVE REPLACEMENT (AVR) TISSUE VALVE 21MM INSPIRIS;  Surgeon: Gaye Pollack, MD;  Location: [facility];  Service: Open Heart Surgery;  Laterality: N/A;   COLONOSCOPY WITH ESOPHAGOGASTRODUODENOSCOPY (EGD)  CYSTOSCOPY WITH RETROGRADE PYELOGRAM, URETEROSCOPY AND STENT PLACEMENT Left 01/21/2015   Procedure: CYSTOSCOPY WITH LEFT  RETROGRADE PYELOGRAM, LEFT URETEROSCOPY AND STENT PLACEMENT;  Surgeon: Festus Aloe, MD;  Location: [facility];  Service: Urology;  Laterality: Left;   CYSTOSCOPY WITH RETROGRADE PYELOGRAM, URETEROSCOPY AND STENT PLACEMENT Bilateral 02/24/2015   Procedure: CYSTOSCOPY WITH RIGHT RETROGRADE PYELOGRAM, BLADDER BIOPSY FULGERATION LEFT URETEROSCOPY AND STENT REPLACEMENT;  Surgeon: Festus Aloe, MD;  Location: [facility];  Service: Urology;  Laterality: Bilateral;   EXPLORATORY LAPAROSCOPY W/   CONE BIOPSY'S LEFT AND RIGHT LOBE OF LIVER  11-04-2007   HOLMIUM LASER APPLICATION Left 18/56/3149   Procedure: HOLMIUM LASER LITHOTRIPSY;  Surgeon: Festus Aloe, MD;  Location[facility];  Service: Urology;  Laterality: Left;   HYSTEROSCOPY WITH D & C N/A 12/11/2012   Procedure: DILATATION AND CURETTAGE /HYSTEROSCOPY;  Surgeon: Maeola Sarah. Landry Mellow, MD;  Location: [facility];  Service: Gynecology;  Laterality: N/A;   INGUINAL HERNIA REPAIR Left 10/22/2016   Procedure: LEFT INGUINAL HERNIA REPAIR;  Surgeon: Rolm Bookbinder, MD;  Location: [place];  Service: General;  Laterality: Left;  TAP BLOCK   INSERTION OF MESH Left 10/22/2016   Procedure: INSERTION OF MESH;  Surgeon: Rolm Bookbinder, MD;  Location: [place];  Service: General;  Laterality: Left;   LAPAROSCOPIC GASTRIC SLEEVE RESECTION  07-27-2013   PUBOVAGINAL SLING  04-10-2001   [place]   RIGHT/LEFT HEART CATH AND CORONARY ANGIOGRAPHY N/A 08/23/2017   Procedure: RIGHT/LEFT HEART CATH AND CORONARY ANGIOGRAPHY;  Surgeon: [name], MD;  Location: [facility];  Service: Cardiovascular;  Laterality: N/A;   TEE WITHOUT CARDIOVERSION N/A 12/05/2017   Procedure: TRANSESOPHAGEAL ECHOCARDIOGRAM (TEE);  Surgeon: Gaye Pollack, MD;  Location: [facility];  Service: Open Heart Surgery;  Laterality: N/A;   TONSILLECTOMY  1975   TRANSTHORACIC ECHOCARDIOGRAM  06-04-2012  dr Irish Lack   mild LVH,  grade I diastolic dysfunction/  ef 60-65%/  moderate LAE/  mild MV calcifation without stenosis,  mild MR/  moderate AV stenosis,  cannot r/o bicupsid, area 1.1cm2/  mild dilated aortic root/  trivial TR     A IV Location/Drains/Wounds Patient Lines/Drains/Airways Status     Active Line/Drains/Airways     None            Intake/Output Last 24 hours No intake or output data in the 24 hours ending 04/13/22 2238  Labs/Imaging Results for orders placed or performed during the hospital encounter of 04/13/22 (from the past 48 hour(s))   Urinalysis, Routine w reflex microscopic     Status: Abnormal   Collection Time: 04/13/22  4:51 PM  Result Value Ref Range   Color, Urine YELLOW YELLOW   APPearance CLEAR CLEAR   Specific Gravity, Urine 1.024 1.005 - 1.030   pH 5.0 5.0 - 8.0   Glucose, UA NEGATIVE NEGATIVE mg/dL   Hgb urine dipstick SMALL (A) NEGATIVE   Bilirubin Urine NEGATIVE NEGATIVE   Ketones, ur NEGATIVE NEGATIVE mg/dL   Protein, ur 100 (A) NEGATIVE mg/dL   Nitrite NEGATIVE NEGATIVE   Leukocytes,Ua NEGATIVE NEGATIVE   RBC / HPF 0-5 0 - 5 RBC/hpf   WBC, UA 0-5 0 - 5 WBC/hpf   Bacteria, UA NONE SEEN NONE SEEN   Mucus PRESENT     Comment: Performed at [facility] [address], [place], [place] [address]  Magnesium     Status: None   Collection Time: 04/13/22  5:07 PM  Result Value  Ref Range   Magnesium 1.8 1.7 - 2.4 mg/dL    Comment: Performed at [facility] [address] [place], [place] [address]  Ethanol     Status: None   Collection Time: 04/13/22  5:07 PM  Result Value Ref Range   Alcohol, Ethyl (B) <10 <10 mg/dL    Comment: (NOTE) Lowest detectable limit for serum alcohol is 10 mg/dL.  For medical purposes only. Performed at [facility], [place] [address] [place], [place] [address]   Protime-INR     Status: Abnormal   Collection Time: 04/13/22  5:07 PM  Result Value Ref Range   Prothrombin Time 15.6 (H) 11.4 - 15.2 seconds   INR 1.3 (H) 0.8 - 1.2    Comment: (NOTE) INR goal varies based on device and disease states. Performed at [facility], [place] [address], [place], [place] [address]   APTT     Status: None   Collection Time: 04/13/22  5:07 PM  Result Value Ref Range   aPTT 36 24 - 36 seconds    Comment: Performed at [facility] [address], [place], [place] [address]  CBC     Status: Abnormal   Collection Time: 04/13/22  5:07 PM  Result Value Ref Range   WBC 2.3 (L) 4.0 - 10.5 K/uL   RBC 4.25 3.87 - 5.11 MIL/uL   Hemoglobin 13.1 12.0 - 15.0  g/dL   HCT 40.1 36.0 - 46.0 %   MCV 94.4 80.0 - 100.0 fL   MCH 30.8 26.0 - 34.0 pg   MCHC 32.7 30.0 - 36.0 g/dL   RDW 13.9 11.5 - 15.5 %   Platelets 76 (L) 150 - 400 K/uL    Comment: Immature Platelet Fraction may be clinically indicated, consider ordering this additional test [id number] REPEATED TO VERIFY PLATELET COUNT CONFIRMED BY SMEAR    nRBC 0.0 0.0 - 0.2 %    Comment: Performed at [facility], [place] [address], [place], [place] [address]  Differential     Status: Abnormal   Collection Time: 04/13/22  5:07 PM  Result Value Ref Range   Neutrophils Relative % 48 %   Neutro Abs 1.1 (L) 1.7 - 7.7 K/uL   Lymphocytes Relative 35 %   Lymphs Abs 0.8 0.7 - 4.0 K/uL   Monocytes Relative 15 %   Monocytes Absolute 0.3 0.1 - 1.0 K/uL   Eosinophils Relative 2 %   Eosinophils Absolute 0.0 0.0 - 0.5 K/uL   Basophils Relative 0 %   Basophils Absolute 0.0 0.0 - 0.1 K/uL   Immature Granulocytes 0 %   Abs Immature Granulocytes 0.01 0.00 - 0.07 K/uL    Comment: Performed at [facility] [address] [place], [place] [address]  Comprehensive metabolic panel     Status: Abnormal   Collection Time: 04/13/22  5:07 PM  Result Value Ref Range   Sodium 139 135 - 145 mmol/L   Potassium 3.9 3.5 - 5.1 mmol/L   Chloride 104 98 - 111 mmol/L   CO2 28 22 - 32 mmol/L   Glucose, Bld 122 (H) 70 - 99 mg/dL    Comment: Glucose reference range applies only to samples taken after fasting for at least 8 hours.   BUN 14 8 - 23 mg/dL   Creatinine, Ser 0.88 0.44 - 1.00 mg/dL   Calcium 8.2 (L) 8.9 - 10.3 mg/dL   Total Protein 5.6 (L) 6.5 - 8.1 g/dL  Albumin 2.4 (L) 3.5 - 5.0 g/dL   AST 31 15 - 41 U/L   ALT 12 0 - 44 U/L   Alkaline Phosphatase 55 38 - 126 U/L   Total Bilirubin 0.9 0.3 - 1.2 mg/dL   GFR, Estimated >60 >60 mL/min    Comment: (NOTE) Calculated using the CKD-EPI Creatinine Equation (2021)    Anion gap 7 5 - 15    Comment: Performed at [facility] [address],  [place], [place] [address]  I-stat chem 8, ED     Status: Abnormal   Collection Time: 04/13/22  5:30 PM  Result Value Ref Range   Sodium 141 135 - 145 mmol/L   Potassium 3.7 3.5 - 5.1 mmol/L   Chloride 102 98 - 111 mmol/L   BUN 14 8 - 23 mg/dL   Creatinine, Ser 0.80 0.44 - 1.00 mg/dL   Glucose, Bld 116 (H) 70 - 99 mg/dL    Comment: Glucose reference range applies only to samples taken after fasting for at least 8 hours.   Calcium, Ion 1.14 (L) 1.15 - 1.40 mmol/L   TCO2 27 22 - 32 mmol/L   Hemoglobin 12.9 12.0 - 15.0 g/dL   HCT 38.0 36.0 - 46.0 %  I-Stat venous blood gas, ED     Status: Abnormal   Collection Time: 04/13/22  5:31 PM  Result Value Ref Range   pH, Ven 7.317 7.25 - 7.43   pCO2, Ven 53.5 44 - 60 mmHg   pO2, Ven 29 (LL) 32 - 45 mmHg   Bicarbonate 27.4 20.0 - 28.0 mmol/L   TCO2 29 22 - 32 mmol/L   O2 Saturation 49 %   Acid-Base Excess 0.0 0.0 - 2.0 mmol/L   Sodium 141 135 - 145 mmol/L   Potassium 3.7 3.5 - 5.1 mmol/L   Calcium, Ion 1.14 (L) 1.15 - 1.40 mmol/L   HCT 39.0 36.0 - 46.0 %   Hemoglobin 13.3 12.0 - 15.0 g/dL   Sample type VENOUS    Comment NOTIFIED PHYSICIAN   Resp panel by RT-PCR (RSV, Flu A&B, Covid) Anterior Nasal Swab     Status: None   Collection Time: 04/13/22  6:07 PM   Specimen: Anterior Nasal Swab  Result Value Ref Range   SARS Coronavirus 2 by RT PCR NEGATIVE NEGATIVE    Comment: (NOTE) SARS-CoV-2 target nucleic acids are NOT DETECTED.  The SARS-CoV-2 RNA is generally detectable in upper respiratory specimens during the acute phase of infection. The lowest concentration of SARS-CoV-2 viral copies this assay can detect is 138 copies/mL. A negative result does not preclude SARS-Cov-2 infection and should not be used as the sole basis for treatment or other patient management decisions. A negative result may occur with  improper specimen collection/handling, submission of specimen other than nasopharyngeal swab, presence of viral mutation(s) within  the areas targeted by this assay, and inadequate number of viral copies(<138 copies/mL). A negative result must be combined with clinical observations, patient history, and epidemiological information. The expected result is Negative.  Fact Sheet for Patients:  EntrepreneurPulse.com.au  Fact Sheet for Healthcare Providers:  IncredibleEmployment.be  This test is no t yet approved or cleared by the [place] FDA and  has been authorized for detection and/or diagnosis of SARS-CoV-2 by FDA under an Emergency Use Authorization (EUA). This EUA will remain  in effect (meaning this test can be used) for the duration of the COVID-19 declaration under Section 564(b)(1) of the Act, 21 U.S.C.section 360bbb-3(b)(1), unless  the authorization is terminated  or revoked sooner.       Influenza A by PCR NEGATIVE NEGATIVE   Influenza B by PCR NEGATIVE NEGATIVE    Comment: (NOTE) The Xpert Xpress SARS-CoV-2/FLU/RSV plus assay is intended as an aid in the diagnosis of influenza from Nasopharyngeal swab specimens and should not be used as a sole basis for treatment. Nasal washings and aspirates are unacceptable for Xpert Xpress SARS-CoV-2/FLU/RSV testing.  Fact Sheet for Patients: EntrepreneurPulse.com.au  Fact Sheet for Healthcare Providers: IncredibleEmployment.be  This test is not yet approved or cleared by the [place] FDA and has been authorized for detection and/or diagnosis of SARS-CoV-2 by FDA under an Emergency Use Authorization (EUA). This EUA will remain in effect (meaning this test can be used) for the duration of the COVID-19 declaration under Section 564(b)(1) of the Act, 21 U.S.C. section 360bbb-3(b)(1), unless the authorization is terminated or revoked.     Resp Syncytial Virus by PCR NEGATIVE NEGATIVE    Comment: (NOTE) Fact Sheet for Patients: EntrepreneurPulse.com.au  Fact  Sheet for Healthcare Providers: IncredibleEmployment.be  This test is not yet approved or cleared by the [place] FDA and has been authorized for detection and/or diagnosis of SARS-CoV-2 by FDA under an Emergency Use Authorization (EUA). This EUA will remain in effect (meaning this test can be used) for the duration of the COVID-19 declaration under Section 564(b)(1) of the Act, 21 U.S.C. section 360bbb-3(b)(1), unless the authorization is terminated or revoked.  Performed at [facility], [place] [address] [place], [place] [address]    DG Chest 2 View  Result Date: 04/13/2022 CLINICAL DATA:  Short of breath EXAM: CHEST - 2 VIEW COMPARISON:  12/17/2018 FINDINGS: Frontal and lateral views of the chest demonstrate postsurgical changes from median sternotomy and aortic valve replacement. The cardiac silhouette is enlarged. There is central vascular congestion and mild diffuse interstitial prominence, without airspace disease, effusion, or pneumothorax. Chronic elevation right hemidiaphragm. No acute bony abnormalities. IMPRESSION: 1. Findings consistent with congestive heart failure and mild interstitial edema. Electronically Signed   By: Randa Ngo M.D.   On: 04/13/2022 19:40   MR BRAIN WO CONTRAST  Result Date: 04/13/2022 CLINICAL DATA:  Double vision, syncope, unsteady on her feet EXAM: MRI HEAD WITHOUT CONTRAST TECHNIQUE: Multiplanar, multiecho pulse sequences of the brain and surrounding structures were obtained without intravenous contrast. COMPARISON:  12/17/2018 MRI head, correlation is also made with 04/13/2022 CT head FINDINGS: Brain: Punctate foci of restricted diffusion with ADC correlates in the right cerebellum (series 9, images 60 and 69), midbrain (series 9, images 75-77), left occipital lobe (series 9, images 82 and 84), and right parietal lobe (series 9, image 93). No acute hemorrhage, mass, mass effect, or midline shift. Punctate focus of  hemosiderin deposition in the medial left parietal lobe, possibly sequela of prior microhemorrhage or tiny embolus. No hydrocephalus or extra-axial collection. Scattered T2 hyperintense signal in the periventricular white matter, likely the sequela of chronic small vessel ischemic disease. Partial empty sella. Normal craniocervical junction. Vascular: Normal arterial flow voids. Skull and upper cervical spine: Normal marrow signal. Sinuses/Orbits: Minimal mucosal thickening in the ethmoid air cells. The orbits are unremarkable. Other: The mastoids are well aerated. IMPRESSION: Punctate acute infarcts in the right cerebellum, midbrain, left occipital lobe, and right parietal lobe. Given multiple vascular territories, a central embolic etiology is suspected. These results were called by telephone at the time of interpretation on 04/13/2022 at 7:33 pm to provider MILLER, who verbally acknowledged these  results. Electronically Signed   By: Merilyn Baba M.D.   On: 04/13/2022 19:33   CT HEAD WO CONTRAST  Result Date: 04/13/2022 CLINICAL DATA:  Double vision, unsteady gait, tingling in extremities EXAM: CT HEAD WITHOUT CONTRAST TECHNIQUE: Contiguous axial images were obtained from the base of the skull through the vertex without intravenous contrast. RADIATION DOSE REDUCTION: This exam was performed according to the departmental dose-optimization program which includes automated exposure control, adjustment of the mA and/or kV according to patient size and/or use of iterative reconstruction technique. COMPARISON:  12/17/2018 FINDINGS: Brain: No acute infarct or hemorrhage. Lateral ventricles and midline structures are unremarkable. No acute extra-axial fluid collections. No mass effect. Vascular: No hyperdense vessel or unexpected calcification. Skull: Normal. Negative for fracture or focal lesion. Sinuses/Orbits: No acute finding. Stable osteoma left ethmoid air cell. Other: None. IMPRESSION: 1. No acute intracranial  process. Electronically Signed   By: Randa Ngo M.D.   On: 04/13/2022 17:43    Pending Labs Unresulted Labs (From admission, onward)     Start     Ordered   04/14/22 0500  Lipid panel  (Labs)  Tomorrow morning,   R       Comments: Fasting    04/13/22 2108   04/13/22 2212  Vitamin B1  Once,   R        04/13/22 2211   04/13/22 2212  Folate  Once,   R        04/13/22 2211   04/13/22 2209  D-dimer, quantitative  Once,   R        04/13/22 2209   04/13/22 2103  Brain natriuretic peptide  Once,   R        04/13/22 2108   04/13/22 2102  Sedimentation rate  Once,   R        04/13/22 2108   04/13/22 2102  C-reactive protein  Once,   R        04/13/22 2108   04/13/22 2102  Culture, blood (Routine X 2) w Reflex to ID Panel  BLOOD CULTURE X 2,   R      04/13/22 2108   04/13/22 2102  Culture, OB Urine  Once,   R        04/13/22 2108   04/13/22 2100  Hemoglobin A1c  (Labs)  Once,   R       Comments: To assess prior glycemic control    04/13/22 2108   04/13/22 2100  CBC  (heparin)  Once,   R       Comments: Baseline for heparin therapy IF NOT ALREADY DRAWN.  Notify MD if PLT < 100 K.    04/13/22 2108   04/13/22 2100  Creatinine, serum  (heparin)  Once,   R       Comments: Baseline for heparin therapy IF NOT ALREADY DRAWN.    04/13/22 2108   04/13/22 1801  Comprehensive metabolic panel  (Stroke Panel ([facility]))  ONCE - STAT,   STAT        04/13/22 1800   04/13/22 1801  Ethanol  (Stroke Panel ([facility]))  Once,   URGENT        04/13/22 1800   04/13/22 1721  Blood gas, venous (at [facility] and [facility])  Once,   R        04/13/22 1722   04/13/22 1721  Urine rapid drug screen (hosp performed)  Once,   STAT        04/13/22 1722  Pending  Anti-DNA antibody, double-stranded  Once,   R        Pending   Pending  C4 complement  Once,   R        Pending   Pending  C3 complement  Once,   R        Pending            Vitals/Pain Today's Vitals   04/13/22 1830 04/13/22 1845 04/13/22 2001 04/13/22 2015   BP:   (!) 153/89 139/76  Pulse: 83 84 84 80  Resp: (!) 27 (!) 28 16   Temp:   99.1 F (37.3 C)   TempSrc:   Oral   SpO2: 94% 99% 99% (!) 35%  Weight:      Height:      PainSc:   0-No pain     Isolation Precautions No active isolations  Medications Medications  ezetimibe (ZETIA) tablet 10 mg (10 mg Oral Patient Refused/Not Given 04/13/22 2200)  arformoterol (BROVANA) nebulizer solution 15 mcg (15 mcg Nebulization Given 04/13/22 2154)    And  umeclidinium bromide (INCRUSE ELLIPTA) 62.5 MCG/ACT 1 puff (has no administration in time range)   stroke: early stages of recovery book (has no administration in time range)  acetaminophen (TYLENOL) tablet 650 mg (has no administration in time range)    Or  acetaminophen (TYLENOL) 160 MG/5ML solution 650 mg (has no administration in time range)    Or  acetaminophen (TYLENOL) suppository 650 mg (has no administration in time range)  senna-docusate (Senokot-S) tablet 1 tablet (has no administration in time range)  heparin injection 5,000 Units (5,000 Units Subcutaneous Given 04/13/22 2128)  albuterol (PROVENTIL) (2.5 MG/3ML) 0.083% nebulizer solution 2.5 mg (has no administration in time range)  cyanocobalamin (VITAMIN B12) injection 1,000 mcg (has no administration in time range)  clopidogrel (PLAVIX) tablet 75 mg (has no administration in time range)  meclizine (ANTIVERT) tablet 25 mg (25 mg Oral Given 04/13/22 1757)    Mobility walks with person assist Low fall risk   Focused Assessments Cardiac Assessment Handoff:    Lab Results  Component Value Date   TROPONINI <0.30 06/14/2013   Lab Results  Component Value Date   DDIMER 0.84 (H) 06/14/2013   Does the Patient currently have chest pain? No   , Neuro Assessment Handoff:  Swallow screen pass? Yes    NIH Stroke Scale ( + Modified Stroke Scale Criteria)  Interval: Initial Level of Consciousness (1a.)   : Alert, keenly responsive LOC Questions (1b. )   +: Answers both  questions correctly LOC Commands (1c. )   + : Performs both tasks correctly Best Gaze (2. )  +: Normal Visual (3. )  +: No visual loss Facial Palsy (4. )    : Normal symmetrical movements Motor Arm, Left (5a. )   +: No drift Motor Arm, Right (5b. )   +: No drift Motor Leg, Left (6a. )   +: No drift Motor Leg, Right (6b. )   +: No drift Limb Ataxia (7. ): Absent Sensory (8. )   +: Normal, no sensory loss Best Language (9. )   +: No aphasia Dysarthria (10. ): Normal Extinction/Inattention (11.)   +: No Abnormality Modified SS Total  +: 0 Complete NIHSS TOTAL: 0     Neuro Assessment: Within Defined Limits Neuro Checks:   Initial (04/13/22 1802)  Last Documented NIHSS Modified Score: 0 (04/13/22 2153) Has TPA been given? No If patient is a Neuro Trauma and  patient is going to OR before floor call report to [place] nurse: [phone] or [phone]   R Recommendations: See Admitting Provider Note  Report given to:   Additional Notes:

## 2022-04-13 NOTE — H&P (Addendum)
History and Physical    Whitney [name] PFX:[id number] DOB: Nov 24, 1955 DOA: 04/13/2022  PCP: Lawerance Cruel, MD  Patient coming from: home I have personally briefly reviewed patient's old medical records in [place]  Chief Complaint: double vision, unsteady gait , tingling b/l fingers x 24 hours  HPI: Whitney [name] is a 66 y.o. female with medical history significant of  Anemia, Asthma, Cirrhosis,DMII, GERD, Depression, fibromyalgia, hx of heart murmur( due to rheumatic fever 1989)  s/p aortic valve replacement 2019, Sjogren's syndrome, hx of OSA on cpap patient presents to ED with complaint of double vision, unsteady gate, feeling of disequilibrium as well as paresthesias in b/l fingers and mouth.  Patient also noted increase fatigue and somnolence.  He notes chronic fibromyalgia pain but noted no cardiac type pain. She notes sob related to history of asthma but noted it was increased from baseline this am. Currently does not feel short of breath. She also noted nausea w/o emesis, as well as right flank pain.   ED Course:  Vitals:afeb, bp 164/85, hr 88, rr 20 sat 98% on ra  EKG-12 sinus rhythm ,no hyperacute changes Labs:  UA negative Na 139, K 3.9, glu 122, cr 0.88, TP 5.6, ast 31, Alt12 PT INR1.3 Wbc 2.3, hgb 13.1, plt 76 Na 141, K 3.7, CL 102, cr 0.8 glu 116, cr 1.14 Ph 7.317, hgb 13.3 HGD:JMEQASTM  MRI brain;Punctate acute infarcts in the right cerebellum, midbrain, left occipital lobe, and right parietal lobe. Given multiple vascular territories, a central embolic etiology is suspected.  Cxr: MPRESSION: 1. Findings consistent with congestive heart failure and mild interstitial edem   Respiratory panel neg Tx meclizine,  Review of Systems: As per HPI otherwise 10 point review of systems negative.   Past Medical History:  Diagnosis Date   Allergic rhinitis    Anemia    hx   Arthritis    Asthma    hx yrs ago   Cirrhosis ([place]) last albumin 3.3 done at [place]  06-16-2014 (under care everywhere tab in epic)   Secondary to Fatty liver --  followed by hepatology at [facility] (dr Gerald Dexter)    Depression    Diabetes mellitus type II    type 2 diet conrolled   Dyspnea    Fibromyalgia    GERD (gastroesophageal reflux disease)    Heart murmur    asymptomatic ---  1989 from rhuematic fever   History of exercise stress test    05-05-2013---  negative bruce ETT given exercise workload,  no ischemia   History of hiatal hernia    History of kidney stones    History of rheumatic fever    1989   Hyperlipidemia    Leukocytopenia    Moderate aortic stenosis    AVA area 1.1cm2---  cardiologist --  dr Concepcion Living, 2014 in epic   NASH (nonalcoholic steatohepatitis)    OSA (obstructive sleep apnea)    was using CPAP before gastric sleeve 2015--  no uses after wt loss   Pneumonia    hx   Sjogren's syndrome ([place])    Thrombocytopenia (HCC)     Past Surgical History:  Procedure Laterality Date   AORTIC VALVE REPLACEMENT N/A 12/05/2017   Procedure: AORTIC VALVE REPLACEMENT (AVR) TISSUE VALVE 21MM INSPIRIS;  Surgeon: Gaye Pollack, MD;  Location: [facility];  Service: Open Heart Surgery;  Laterality: N/A;   COLONOSCOPY WITH ESOPHAGOGASTRODUODENOSCOPY (EGD)     CYSTOSCOPY WITH RETROGRADE PYELOGRAM, URETEROSCOPY AND STENT PLACEMENT  Left 01/21/2015   Procedure: CYSTOSCOPY WITH LEFT  RETROGRADE PYELOGRAM, LEFT URETEROSCOPY AND STENT PLACEMENT;  Surgeon: Festus Aloe, MD;  Location: [facility];  Service: Urology;  Laterality: Left;   CYSTOSCOPY WITH RETROGRADE PYELOGRAM, URETEROSCOPY AND STENT PLACEMENT Bilateral 02/24/2015   Procedure: CYSTOSCOPY WITH RIGHT RETROGRADE PYELOGRAM, BLADDER BIOPSY FULGERATION LEFT URETEROSCOPY AND STENT REPLACEMENT;  Surgeon: Festus Aloe, MD;  Location: [facility];  Service: Urology;  Laterality: Bilateral;   EXPLORATORY LAPAROSCOPY W/  CONE BIOPSY'S LEFT AND RIGHT LOBE OF LIVER  11-04-2007   HOLMIUM LASER  APPLICATION Left 18/56/3149   Procedure: HOLMIUM LASER LITHOTRIPSY;  Surgeon: Festus Aloe, MD;  Location: [facility];  Service: Urology;  Laterality: Left;   HYSTEROSCOPY WITH D & C N/A 12/11/2012   Procedure: DILATATION AND CURETTAGE /HYSTEROSCOPY;  Surgeon: Maeola Sarah. Landry Mellow, MD;  Location: [facility];  Service: Gynecology;  Laterality: N/A;   INGUINAL HERNIA REPAIR Left 10/22/2016   Procedure: LEFT INGUINAL HERNIA REPAIR;  Surgeon: Rolm Bookbinder, MD;  Location: [facility];  Service: General;  Laterality: Left;  TAP BLOCK   INSERTION OF MESH Left 10/22/2016   Procedure: INSERTION OF MESH;  Surgeon: Rolm Bookbinder, MD;  Location: [facility];  Service: General;  Laterality: Left;   LAPAROSCOPIC GASTRIC SLEEVE RESECTION  07-27-2013   PUBOVAGINAL SLING  04-10-2001   [place]   RIGHT/LEFT HEART CATH AND CORONARY ANGIOGRAPHY N/A 08/23/2017   Procedure: RIGHT/LEFT HEART CATH AND CORONARY ANGIOGRAPHY;  Surgeon: [name], MD;  Location: [facility];  Service: Cardiovascular;  Laterality: N/A;   TEE WITHOUT CARDIOVERSION N/A 12/05/2017   Procedure: TRANSESOPHAGEAL ECHOCARDIOGRAM (TEE);  Surgeon: Gaye Pollack, MD;  Location: [facility];  Service: Open Heart Surgery;  Laterality: N/A;   TONSILLECTOMY  1975   TRANSTHORACIC ECHOCARDIOGRAM  06-04-2012  dr Irish Lack   mild LVH,  grade I diastolic dysfunction/  ef 60-65%/  moderate LAE/  mild MV calcifation without stenosis,  mild MR/  moderate AV stenosis,  cannot r/o bicupsid, area 1.1cm2/  mild dilated aortic root/  trivial TR     reports that she has never smoked. She has never used smokeless tobacco. She reports current alcohol use. She reports that she does not use drugs.  Allergies  Allergen Reactions   Doxycycline Hives and Rash   Naproxen Rash and Hives    Family History  Problem Relation Age of Onset   Alzheimer's disease Father    Hip fracture Father    Asthma Father    Heart disease Father    Heart attack Father     Hypertension Father    Rheum arthritis Mother    Heart disease Mother    Allergies Daughter    Asthma Paternal Grandmother    Asthma Daughter    Stroke Neg Hx     Prior to Admission medications   Medication Sig Start Date End Date Taking? Authorizing Provider  amoxicillin (AMOXIL) 500 MG capsule Take 4 capsules 1 hour prior to any dental procedures Patient taking differently: Take 1,500 mg by mouth once. One hour prior to dental procedures. 09/03/19  Yes [name], MD  ezetimibe (ZETIA) 10 MG tablet Take 10 mg by mouth at bedtime.  09/17/16  Yes [provider]  magnesium oxide (MAG-OX) 400 MG tablet Take 400 mg by mouth daily. 01/24/22 01/24/23 Yes [provider]  milk thistle 175 MG tablet Take 175 mg by mouth See admin instructions. Take 175 mg by mouth every 2-3  days   Yes [provider]  OZEMPIC, 0.25 OR 0.5 MG/DOSE, 2 MG/3ML SOPN Inject 2 mg into the skin once a week. 09/21/21  Yes [provider]  PROAIR HFA 108 (90 Base) MCG/ACT inhaler INHALE 2 PUFFS INTO THE LUNGS EVERY 4 (FOUR) HOURS AS NEEDED FOR WHEEZING OR SHORTNESS OF BREATH. Patient taking differently: Inhale 2 puffs into the lungs every 4 (four) hours as needed for wheezing or shortness of breath. 08/09/20  Yes Olalere, Adewale A, MD  STIOLTO RESPIMAT 2.5-2.5 MCG/ACT AERS INHALE 2 PUFFS BY MOUTH INTO THE LUNGS DAILY Patient taking differently: Inhale 2 each into the lungs daily. INHALE 2 PUFFS BY MOUTH INTO THE LUNGS DAILY 01/05/22  Yes Byrum, Rose Fillers, MD  XIIDRA 5 % SOLN Place 1 drop into both eyes daily. 11/08/21  Yes [provider]  nadolol (CORGARD) 20 MG tablet Take 20 mg by mouth daily.    [provider]    Physical Exam: Vitals:   04/13/22 1830 04/13/22 1845 04/13/22 2001 04/13/22 2015  BP:   (!) 153/89 139/76  Pulse: 83 84 84 80  Resp: (!) 27 (!) 28 16   Temp:   99.1 F (37.3 C)   TempSrc:   Oral   SpO2: 94% 99% 99% (!) 35%  Weight:      Height:         Constitutional: NAD, calm, comfortable Vitals:   04/13/22 1830 04/13/22 1845 04/13/22 2001 04/13/22 2015  BP:   (!) 153/89 139/76  Pulse: 83 84 84 80  Resp: (!) 27 (!) 28 16   Temp:   99.1 F (37.3 C)   TempSrc:   Oral   SpO2: 94% 99% 99% (!) 35%  Weight:      Height:       Eyes: PERRL, lids and conjunctivae normal ENMT: Mucous membranes are moist. Posterior pharynx clear of any exudate or lesions.Normal dentition.  Neck: normal, supple, no masses, no thyromegaly Respiratory: clear to auscultation bilaterally, no wheezing, no crackles. Normal respiratory effort. No accessory muscle use.  Cardiovascular: Regular rate and rhythm, no murmurs / rubs / gallops. No extremity edema. 2+ pedal pulses.  Abdomen: no tenderness, no masses palpated. No hepatosplenomegaly. Bowel sounds positive.  Musculoskeletal: no clubbing / cyanosis. No joint deformity upper and lower extremities. Good ROM, no contractures. Normal muscle tone.  Skin: no rashes, lesions, ulcers. No induration Neurologic: CN 2-12 grossly intact. Sensation intact, DTR normal. Strength 5/5 in all 4.  Psychiatric: Normal judgment and insight. Alert and oriented x 3. Normal mood.    Labs on Admission: I have personally reviewed following labs and imaging studies  CBC: Recent Labs  Lab 04/13/22 1707 04/13/22 1730 04/13/22 1731  WBC 2.3*  --   --   NEUTROABS 1.1*  --   --   HGB 13.1 12.9 13.3  HCT 40.1 38.0 39.0  MCV 94.4  --   --   PLT 76*  --   --    Basic Metabolic Panel: Recent Labs  Lab 04/13/22 1707 04/13/22 1730 04/13/22 1731  NA 139 141 141  K 3.9 3.7 3.7  CL 104 102  --   CO2 28  --   --   GLUCOSE 122* 116*  --   BUN 14 14  --   CREATININE 0.88 0.80  --   CALCIUM 8.2*  --   --   MG 1.8  --   --    GFR: Estimated Creatinine Clearance: 76.5 mL/min (by  C-G formula based on SCr of 0.8 mg/dL). Liver Function Tests: Recent Labs  Lab 04/13/22 1707  AST 31  ALT 12  ALKPHOS 55  BILITOT 0.9  PROT  5.6*  ALBUMIN 2.4*   No results for input(s): "LIPASE", "AMYLASE" in the last 168 hours. No results for input(s): "AMMONIA" in the last 168 hours. Coagulation Profile: Recent Labs  Lab 04/13/22 1707  INR 1.3*   Cardiac Enzymes: No results for input(s): "CKTOTAL", "CKMB", "CKMBINDEX", "TROPONINI" in the last 168 hours. BNP (last 3 results) No results for input(s): "PROBNP" in the last 8760 hours. HbA1C: No results for input(s): "HGBA1C" in the last 72 hours. CBG: No results for input(s): "GLUCAP" in the last 168 hours. Lipid Profile: No results for input(s): "CHOL", "HDL", "LDLCALC", "TRIG", "CHOLHDL", "LDLDIRECT" in the last 72 hours. Thyroid Function Tests: No results for input(s): "TSH", "T4TOTAL", "FREET4", "T3FREE", "THYROIDAB" in the last 72 hours. Anemia Panel: No results for input(s): "VITAMINB12", "FOLATE", "FERRITIN", "TIBC", "IRON", "RETICCTPCT" in the last 72 hours. Urine analysis:    Component Value Date/Time   COLORURINE YELLOW 12/03/2017 1500   APPEARANCEUR CLEAR 12/03/2017 1500   LABSPEC 1.020 12/03/2017 1500   PHURINE 6.0 12/03/2017 1500   GLUCOSEU NEGATIVE 12/03/2017 1500   HGBUR NEGATIVE 12/03/2017 1500   BILIRUBINUR NEGATIVE 12/03/2017 1500   KETONESUR NEGATIVE 12/03/2017 1500   PROTEINUR NEGATIVE 12/03/2017 1500   UROBILINOGEN 1.0 02/15/2015 0837   NITRITE NEGATIVE 12/03/2017 1500   LEUKOCYTESUR TRACE (A) 12/03/2017 1500    Radiological Exams on Admission: DG Chest 2 View  Result Date: 04/13/2022 CLINICAL DATA:  Short of breath EXAM: CHEST - 2 VIEW COMPARISON:  12/17/2018 FINDINGS: Frontal and lateral views of the chest demonstrate postsurgical changes from median sternotomy and aortic valve replacement. The cardiac silhouette is enlarged. There is central vascular congestion and mild diffuse interstitial prominence, without airspace disease, effusion, or pneumothorax. Chronic elevation right hemidiaphragm. No acute bony abnormalities. IMPRESSION: 1.  Findings consistent with congestive heart failure and mild interstitial edema. Electronically Signed   By: Randa Ngo M.D.   On: 04/13/2022 19:40   MR BRAIN WO CONTRAST  Result Date: 04/13/2022 CLINICAL DATA:  Double vision, syncope, unsteady on her feet EXAM: MRI HEAD WITHOUT CONTRAST TECHNIQUE: Multiplanar, multiecho pulse sequences of the brain and surrounding structures were obtained without intravenous contrast. COMPARISON:  12/17/2018 MRI head, correlation is also made with 04/13/2022 CT head FINDINGS: Brain: Punctate foci of restricted diffusion with ADC correlates in the right cerebellum (series 9, images 60 and 69), midbrain (series 9, images 75-77), left occipital lobe (series 9, images 82 and 84), and right parietal lobe (series 9, image 93). No acute hemorrhage, mass, mass effect, or midline shift. Punctate focus of hemosiderin deposition in the medial left parietal lobe, possibly sequela of prior microhemorrhage or tiny embolus. No hydrocephalus or extra-axial collection. Scattered T2 hyperintense signal in the periventricular white matter, likely the sequela of chronic small vessel ischemic disease. Partial empty sella. Normal craniocervical junction. Vascular: Normal arterial flow voids. Skull and upper cervical spine: Normal marrow signal. Sinuses/Orbits: Minimal mucosal thickening in the ethmoid air cells. The orbits are unremarkable. Other: The mastoids are well aerated. IMPRESSION: Punctate acute infarcts in the right cerebellum, midbrain, left occipital lobe, and right parietal lobe. Given multiple vascular territories, a central embolic etiology is suspected. These results were called by telephone at the time of interpretation on 04/13/2022 at 7:33 pm to provider MILLER, who verbally acknowledged these results. Electronically Signed   By: Bryson Ha  Vasan M.D.   On: 04/13/2022 19:33   CT HEAD WO CONTRAST  Result Date: 04/13/2022 CLINICAL DATA:  Double vision, unsteady gait, tingling in  extremities EXAM: CT HEAD WITHOUT CONTRAST TECHNIQUE: Contiguous axial images were obtained from the base of the skull through the vertex without intravenous contrast. RADIATION DOSE REDUCTION: This exam was performed according to the departmental dose-optimization program which includes automated exposure control, adjustment of the mA and/or kV according to patient size and/or use of iterative reconstruction technique. COMPARISON:  12/17/2018 FINDINGS: Brain: No acute infarct or hemorrhage. Lateral ventricles and midline structures are unremarkable. No acute extra-axial fluid collections. No mass effect. Vascular: No hyperdense vessel or unexpected calcification. Skull: Normal. Negative for fracture or focal lesion. Sinuses/Orbits: No acute finding. Stable osteoma left ethmoid air cell. Other: None. IMPRESSION: 1. No acute intracranial process. Electronically Signed   By: Randa Ngo M.D.   On: 04/13/2022 17:43    EKG: Independently reviewed. See above  Assessment/Plan Multiple areas of Acute infract with concern for Acute Embolic CVA -double vision / disequilibrium/b/l paresthesias of b/l hands -admit NIHSS 0 -CTH negative  -MRI brain :Punctate acute infarcts in the right cerebellum, midbrain, left occipital lobe, and right parietal lobe -CTA  pending  -symptoms improved  but still with vague feeling of disequilibrium  - case discussed with neurology , will f/u on final recs -admit cva protocol -A1c/HLD pending -Permissive HTN x48 hours from sx onset  -goal BP < 220/110, PRN above these parameters -  ASA 59m daily(all to naproxen per pharmacy low risk cross-reactivity) , plavix 72mdaily x21 days f/b ASA 8184maily monotherapy after that -neuro checks , SLP, PT/OT  -await final neuro recs    Abn CXR Hx of increase sob this am  -? Congestion on cxr -lung exam no adventitious sounds -BNP, d-dimer Ct chest   Asthma -no acute exacerbation  -resume home regimen   Cirrhosis  NASH -associated leukopenia and thrombocytopenia -stable lfts  -inr slightly above normal 1.3 monitor labs    Leukopenia/thrombocytopenia -presumed related to NASH -continue to monitor counts   DMII -iss/fs    GERD -ppi  Depression Fibromyalgia -resume home regimen   Hx of heart murmur -due to rheumatic fever 1989  Sjogren's syndrome -check esr/crp    Hx of OSA  -cpap qhs    DVT prophylaxis: heaprin Code Status: full Family Communication:  Disposition Plan: patient  expected to be admitted greater than 2 midnights  Consults called: Dr KalSarita Havermission status: progressive   SarClance Boll [facility]   If 7PM-7AM, please contact night-coverage [email or web address] Password [email or web address]  04/13/2022, 8:22 PM

## 2022-04-13 NOTE — ED Provider Notes (Signed)
[name] Provider Note   CSN: [id number] Arrival date & time: 04/13/22  1651     History  Chief Complaint  Patient presents with   Weakness   Gait Problem    [name] is a 66 y.o. female, she of aortic valve replacement, cirrhosis, who presents to the ED secondary to double vision at 730 last night, with associated feelings of off balance, and difficulty walking.  She denies that she has been feeling off all week, and this was the first time that she is ever felt that off balance.  States that she felt like she was almost drunk, but was not drinking any alcohol, she notes that she has had vertigo in the past, and this is much different than that.  States that she went to bed, did not have any falls, and when she woke up the double vision was improved, and she felt better.  Still was having difficulty with balance, but went to lunch with some friends.  After she returned from lunch, she accidentally fell asleep in the collar, and she woke up to her phone ringing.  She states that she has never had this happen before, and just feels very off.  Informed her daughters who told her to come to the ER.  She also notes that around the time when she was having the double vision she had some numbness of her lips and fingers, that has been persistent.  Denies any weakness on one side of the body, changes in speech, or facial drooping.  Denies any headache.  Also has a complaint of shortness of breath, she states she has chronic shortness of breath secondary to a paralyzed diaphragm, and typically her albuterol inhaler helped, but today she feels like she cannot catch her breath.  She states that she tried her albuterol without any relief.  Denies any chest pain, abdominal pain.   Is not on any anticoagulants.      Home Medications Prior to Admission medications   Medication Sig Start Date End Date Taking? Authorizing Provider  amoxicillin (AMOXIL) 500  MG capsule Take 4 capsules 1 hour prior to any dental procedures 09/03/19   [name], MD  ezetimibe (ZETIA) 10 MG tablet Take 10 mg by mouth at bedtime.  09/17/16   [provider]  magnesium oxide (MAG-OX) 400 MG tablet Take 400 mg by mouth daily. 01/24/22 01/24/23  [provider]  milk thistle 175 MG tablet Take 175 mg by mouth daily.    [provider]  nadolol (CORGARD) 20 MG tablet Take 20 mg by mouth daily.    [provider]  OZEMPIC, 0.25 OR 0.5 MG/DOSE, 2 MG/3ML SOPN Inject into the skin. Patient not taking: Reported on 03/21/2022 09/21/21   [provider]  PROAIR HFA 108 (90 Base) MCG/ACT inhaler INHALE 2 PUFFS INTO THE LUNGS EVERY 4 (FOUR) HOURS AS NEEDED FOR WHEEZING OR SHORTNESS OF BREATH. 08/09/20   Laurin Coder, MD  STIOLTO RESPIMAT 2.5-2.5 MCG/ACT AERS INHALE 2 PUFFS BY MOUTH INTO THE LUNGS DAILY 01/05/22   Collene Gobble, MD  XIIDRA 5 % SOLN Apply to eye. 11/08/21   [provider]      Allergies    Doxycycline and Naproxen    Review of Systems   Review of Systems  Neurological:  Positive for dizziness, weakness and numbness. Negative for facial asymmetry, speech difficulty and headaches.    Physical Exam Updated Vital Signs BP Marland Kitchen)  149/83   Pulse 87   Temp 98.8 F (37.1 C) (Oral)   Resp 19   Ht 5' 4"  (1.626 m)   Wt 98 kg   SpO2 100%   BMI 37.09 kg/m  Physical Exam Vitals and nursing note reviewed.  Constitutional:      General: She is not in acute distress.    Appearance: She is well-developed.  HENT:     Head: Normocephalic and atraumatic.  Eyes:     General: No visual field deficit.    Conjunctiva/sclera: Conjunctivae normal.  Cardiovascular:     Rate and Rhythm: Normal rate and regular rhythm.     Heart sounds: No murmur heard. Pulmonary:     Effort: Pulmonary effort is normal. No respiratory distress.     Breath sounds: Normal breath sounds.  Abdominal:     Palpations: Abdomen is  soft.     Tenderness: There is no abdominal tenderness.  Musculoskeletal:        General: No swelling.     Cervical back: Neck supple.  Skin:    General: Skin is warm and dry.     Capillary Refill: Capillary refill takes less than 2 seconds.  Neurological:     Mental Status: She is alert.     Cranial Nerves: Cranial nerves 2-12 are intact. No dysarthria or facial asymmetry.     Motor: Motor function is intact.     Coordination: Finger-Nose-Finger Test normal.     Comments: Perceived sensory deficit. Wide based gait.  Psychiatric:        Mood and Affect: Mood normal.     ED Results / Procedures / Treatments   Labs (all labs ordered are listed, but only abnormal results are displayed) Labs Reviewed  PROTIME-INR - Abnormal; Notable for the following components:      Result Value   Prothrombin Time 15.6 (*)    INR 1.3 (*)    All other components within normal limits  CBC - Abnormal; Notable for the following components:   WBC 2.3 (*)    All other components within normal limits  COMPREHENSIVE METABOLIC PANEL - Abnormal; Notable for the following components:   Glucose, Bld 122 (*)    Calcium 8.2 (*)    Total Protein 5.6 (*)    Albumin 2.4 (*)    All other components within normal limits  I-STAT CHEM 8, ED - Abnormal; Notable for the following components:   Glucose, Bld 116 (*)    Calcium, Ion 1.14 (*)    All other components within normal limits  I-STAT VENOUS BLOOD GAS, ED - Abnormal; Notable for the following components:   pO2, Ven 29 (*)    Calcium, Ion 1.14 (*)    All other components within normal limits  RESP PANEL BY RT-PCR (RSV, FLU A&B, COVID)  RVPGX2  MAGNESIUM  ETHANOL  APTT  BLOOD GAS, VENOUS  DIFFERENTIAL  RAPID URINE DRUG SCREEN, HOSP PERFORMED  URINALYSIS, ROUTINE W REFLEX MICROSCOPIC  COMPREHENSIVE METABOLIC PANEL  ETHANOL  I-STAT CHEM 8, ED    EKG EKG Interpretation  Date/Time:  Friday April 13 2022 17:02:05 EST Ventricular Rate:  89 PR  Interval:  152 QRS Duration: 94 QT Interval:  378 QTC Calculation: 459 R Axis:   42 Text Interpretation: Normal sinus rhythm Anteroseptal infarct , age undetermined Abnormal ECG When compared with ECG of 17-Dec-2018 15:29, PREVIOUS ECG IS PRESENT Confirmed by Noemi Chapel [phone]) on 04/13/2022 5:55:29 PM  Radiology CT HEAD WO CONTRAST  Result Date:  04/13/2022 CLINICAL DATA:  Double vision, unsteady gait, tingling in extremities EXAM: CT HEAD WITHOUT CONTRAST TECHNIQUE: Contiguous axial images were obtained from the base of the skull through the vertex without intravenous contrast. RADIATION DOSE REDUCTION: This exam was performed according to the departmental dose-optimization program which includes automated exposure control, adjustment of the mA and/or kV according to patient size and/or use of iterative reconstruction technique. COMPARISON:  12/17/2018 FINDINGS: Brain: No acute infarct or hemorrhage. Lateral ventricles and midline structures are unremarkable. No acute extra-axial fluid collections. No mass effect. Vascular: No hyperdense vessel or unexpected calcification. Skull: Normal. Negative for fracture or focal lesion. Sinuses/Orbits: No acute finding. Stable osteoma left ethmoid air cell. Other: None. IMPRESSION: 1. No acute intracranial process. Electronically Signed   By: Randa Ngo M.D.   On: 04/13/2022 17:43    Procedures Procedures    Medications Ordered in ED Medications  meclizine (ANTIVERT) tablet 25 mg (25 mg Oral Given 04/13/22 1757)    ED Course/ Medical Decision Making/ A&P                           Medical Decision Making Patient is 66 year old female, here for neurocomplaints, and shortness of breath.  On-call neurology was consulted, she recommended MRI, no admission at this time for stroke workup.  We will obtain MRI of brain to rule out a stroke.  Additionally chest x-ray COVID flu for evaluation of shortness of breath as well as an EKG.  Further labs ordered  for evaluation of her symptoms.  Amount and/or Complexity of Data Reviewed Labs: ordered.    Details: No acute findings on labs. Radiology: ordered.    Details: CT head unremarkable. Discussion of management or test interpretation with external provider(s): Signed out to Dr. Sabra Heck, pending labs, MRI.   Final Clinical Impression(s) / ED Diagnoses Final diagnoses:  Weakness  Shortness of breath  Tingling of upper extremity and face    Rx / DC Orders ED Discharge Orders     None         Dakiya Puopolo, Si Gaul, PA 04/13/22 1842    Noemi Chapel, MD 04/13/22 2019

## 2022-04-13 NOTE — Consult Note (Signed)
NEUROLOGY CONSULTATION NOTE   Date of service: April 13, 2022 Patient Name: [name] MRN:  [id number] DOB:  1955-10-04 Reason for consult: "diplopia, gait abnormality, MRI demonstrating strokes" Requesting Provider: Clance Boll, MD _ _ _   _ __   _ __ _ _  __ __   _ __   __ _  History of Present Illness  [name] is a 66 y.o. female with PMH significant for DM2, GERD, cirrhosis, Sjogrens syndrome, OSA who presents with tingling in her extremities, unsteady gait and diplopia. Symptoms started last evening around 1900. She went to eat out this AM and noticed she was unsteady and nauseated.  She drove home from the lunch with her friend and fell asleep in the car which she has never done. Woke up and got to the bed. Spoke to her granddaughter and strongly recommended to come to the ED.  She came to the ED where MRI Brain demonstrated punctate acute infarcts in the right cerebellum, midbrain, left occipital lobe, and right parietal lobe.  Neurology consulted for further evaluation.  Patient denies prior hx of strokes, mom might have had stroke, reports Diabetes is well controlled and last HbA1c was in 5s. No hx of HTN, takes Zetia for HLD but not on statin due to liver cirrhosis.  Does not smoke, no etOH, no recreational substances.  Endorses chronic neuropathy that has been gradually worsening. Pain is worse during night.  LKW: 1900 on 04/12/22. mRS: 0 tNKASE: not offered, outside window Thrombectomy: not offered, low suspicion for LVO, symptoms resolved. NIHSS components Score: Comment  1a Level of Conscious 0[x]  1[]  2[]  3[]      1b LOC Questions 0[x]  1[]  2[]       1c LOC Commands 0[x]  1[]  2[]       2 Best Gaze 0[x]  1[]  2[]       3 Visual 0[x]  1[]  2[]  3[]      4 Facial Palsy 0[x]  1[]  2[]  3[]      5a Motor Arm - left 0[x]  1[]  2[]  3[]  4[]  UN[]    5b Motor Arm - Right 0[x]  1[]  2[]  3[]  4[]  UN[]    6a Motor Leg - Left 0[x]  1[]  2[]  3[]  4[]  UN[]    6b Motor Leg - Right 0[x]  1[]   2[]  3[]  4[]  UN[]    7 Limb Ataxia 0[x]  1[]  2[]  3[]  UN[]     8 Sensory 0[x]  1[]  2[]  UN[]      9 Best Language 0[x]  1[]  2[]  3[]      10 Dysarthria 0[x]  1[]  2[]  UN[]      11 Extinct. and Inattention 0[x]  1[]  2[]       TOTAL: 0      ROS   Constitutional Denies weight loss, fever and chills.   HEENT Denies changes in vision and hearing.   Respiratory Denies SOB and cough.   CV Denies palpitations and CP   GI Denies abdominal pain, nausea, vomiting and diarrhea.   GU Denies dysuria and urinary frequency.   MSK Denies myalgia and joint pain.   Skin Denies rash and pruritus.   Neurological Denies headache and syncope.   Psychiatric Denies recent changes in mood. Denies anxiety and depression.    Past History   Past Medical History:  Diagnosis Date   Allergic rhinitis    Anemia    hx   Arthritis    Asthma    hx yrs ago   Cirrhosis ([place]) last albumin 3.3 done at [facility] 06-16-2014 (under care everywhere tab in epic)   Secondary to Fatty  liver --  followed by hepatology at [facility] (dr Gerald Dexter)    Depression    Diabetes mellitus type II    type 2 diet conrolled   Dyspnea    Fibromyalgia    GERD (gastroesophageal reflux disease)    Heart murmur    asymptomatic ---  1989 from rhuematic fever   History of exercise stress test    05-05-2013---  negative bruce ETT given exercise workload,  no ischemia   History of hiatal hernia    History of kidney stones    History of rheumatic fever    1989   Hyperlipidemia    Leukocytopenia    Moderate aortic stenosis    AVA area 1.1cm2---  cardiologist --  dr Concepcion Living, 2014 in epic   NASH (nonalcoholic steatohepatitis)    OSA (obstructive sleep apnea)    was using CPAP before gastric sleeve 2015--  no uses after wt loss   Pneumonia    hx   Sjogren's syndrome ([place])    Thrombocytopenia (HCC)    Past Surgical History:  Procedure Laterality Date   AORTIC VALVE REPLACEMENT N/A 12/05/2017   Procedure: AORTIC VALVE REPLACEMENT (AVR) TISSUE VALVE 21MM  INSPIRIS;  Surgeon: Gaye Pollack, MD;  Location: [facility];  Service: Open Heart Surgery;  Laterality: N/A;   COLONOSCOPY WITH ESOPHAGOGASTRODUODENOSCOPY (EGD)     CYSTOSCOPY WITH RETROGRADE PYELOGRAM, URETEROSCOPY AND STENT PLACEMENT Left 01/21/2015   Procedure: CYSTOSCOPY WITH LEFT  RETROGRADE PYELOGRAM, LEFT URETEROSCOPY AND STENT PLACEMENT;  Surgeon: Festus Aloe, MD;  Location: [facility];  Service: Urology;  Laterality: Left;   CYSTOSCOPY WITH RETROGRADE PYELOGRAM, URETEROSCOPY AND STENT PLACEMENT Bilateral 02/24/2015   Procedure: CYSTOSCOPY WITH RIGHT RETROGRADE PYELOGRAM, BLADDER BIOPSY FULGERATION LEFT URETEROSCOPY AND STENT REPLACEMENT;  Surgeon: Festus Aloe, MD;  Location[facility];  Service: Urology;  Laterality: Bilateral;   EXPLORATORY LAPAROSCOPY W/  CONE BIOPSY'S LEFT AND RIGHT LOBE OF LIVER  11-04-2007   HOLMIUM LASER APPLICATION Left 84/53/6468   Procedure: HOLMIUM LASER LITHOTRIPSY;  Surgeon: Festus Aloe, MD;  Location: [facility];  Service: Urology;  Laterality: Left;   HYSTEROSCOPY WITH D & C N/A 12/11/2012   Procedure: DILATATION AND CURETTAGE /HYSTEROSCOPY;  Surgeon: Maeola Sarah. Landry Mellow, MD;  Location: [facility];  Service: Gynecology;  Laterality: N/A;   INGUINAL HERNIA REPAIR Left 10/22/2016   Procedure: LEFT INGUINAL HERNIA REPAIR;  Surgeon: Rolm Bookbinder, MD;  Location: [facility];  Service: General;  Laterality: Left;  TAP BLOCK   INSERTION OF MESH Left 10/22/2016   Procedure: INSERTION OF MESH;  Surgeon: Rolm Bookbinder, MD;  Location: [place] [facility];  Service: General;  Laterality: Left;   LAPAROSCOPIC GASTRIC SLEEVE RESECTION  07-27-2013   PUBOVAGINAL SLING  04-10-2001   [place]   RIGHT/LEFT HEART CATH AND CORONARY ANGIOGRAPHY N/A 08/23/2017   Procedure: RIGHT/LEFT HEART CATH AND CORONARY ANGIOGRAPHY;  Surgeon: [name], MD;  Location: [place] [facility];  Service: Cardiovascular;  Laterality: N/A;   TEE WITHOUT  CARDIOVERSION N/A 12/05/2017   Procedure: TRANSESOPHAGEAL ECHOCARDIOGRAM (TEE);  Surgeon: Gaye Pollack, MD;  Location: [place];  Service: Open Heart Surgery;  Laterality: N/A;   TONSILLECTOMY  1975   TRANSTHORACIC ECHOCARDIOGRAM  06-04-2012  dr Irish Lack   mild LVH,  grade I diastolic dysfunction/  ef 60-65%/  moderate LAE/  mild MV calcifation without stenosis,  mild MR/  moderate AV stenosis,  cannot r/o bicupsid, area 1.1cm2/  mild dilated aortic root/  trivial TR  Family History  Problem Relation Age of Onset   Alzheimer's disease Father    Hip fracture Father    Asthma Father    Heart disease Father    Heart attack Father    Hypertension Father    Rheum arthritis Mother    Heart disease Mother    Allergies Daughter    Asthma Paternal Grandmother    Asthma Daughter    Stroke Neg Hx    Social History   Socioeconomic History   Marital status: Widowed    Spouse name: Married to [employer]   Number of children: Not on file   Years of education: Not on file   Highest education level: Not on file  Occupational History   Occupation: principal of [employer]  Tobacco Use   Smoking status: Never   Smokeless tobacco: Never  Vaping Use   Vaping Use: Never used  Substance and Sexual Activity   Alcohol use: Yes    Comment: rarely   Drug use: No   Sexual activity: Not on file  Other Topics Concern   Not on file  Social History Narrative   Not on file   Social Determinants of Health   Financial Resource Strain: Not on file  Food Insecurity: No Food Insecurity (12/08/2021)   Hunger Vital Sign    Worried About Running Out of Food in the Last Year: Never true    Ran Out of Food in the Last Year: Never true  Transportation Needs: No Transportation Needs (12/08/2021)   PRAPARE - Hydrologist (Medical): No    Lack of Transportation (Non-Medical): No  Physical Activity: Not on file  Stress: Not on file  Social Connections: Not on file   Allergies   Allergen Reactions   Doxycycline Hives and Rash   Naproxen Rash and Hives    Medications  (Not in a hospital admission)    Vitals   Vitals:   04/13/22 1830 04/13/22 1845 04/13/22 2001 04/13/22 2015  BP:   (!) 153/89 139/76  Pulse: 83 84 84 80  Resp: (!) 27 (!) 28 16   Temp:   99.1 F (37.3 C)   TempSrc:   Oral   SpO2: 94% 99% 99% (!) 35%  Weight:      Height:         Body mass index is 37.09 kg/m.  Physical Exam   General: Laying comfortably in bed; in no acute distress.  HENT: Normal oropharynx and mucosa. Normal external appearance of ears and nose.  Neck: Supple, no pain or tenderness  CV: No JVD. No peripheral edema.  Pulmonary: Symmetric Chest rise. Normal respiratory effort.  Abdomen: Soft to touch, non-tender.  Ext: No cyanosis, edema, or deformity  Skin: No rash. Normal palpation of skin.   Musculoskeletal: Normal digits and nails by inspection. No clubbing.   Neurologic Examination  Mental status/Cognition: Alert, oriented to self, place, month and year, good attention.  Speech/language: Fluent, comprehension intact, object naming intact, repetition intact.  Cranial nerves:   CN II Did not want me to shine light in her eyes, no VF deficits    CN III,IV,VI EOM intact, no gaze preference or deviation, no nystagmus    CN V normal sensation in V1, V2, and V3 segments bilaterally    CN VII no asymmetry, no nasolabial fold flattening    CN VIII normal hearing to speech    CN IX & X normal palatal elevation, no uvular deviation  CN XI 5/5 head turn and 5/5 shoulder shrug bilaterally    CN XII midline tongue protrusion    Motor:  Muscle bulk: normal, tone normal, pronator drift none tremor none Mvmt Root Nerve  Muscle Right Left Comments  SA C5/6 Ax Deltoid 5 5   EF C5/6 Mc Biceps 5 5   EE C6/7/8 Rad Triceps 5 5   WF C6/7 Med FCR     WE C7/8 PIN ECU     F Ab C8/T1 U ADM/FDI 5 5   HF L1/2/3 Fem Illopsoas 5 5   KE L2/3/4 Fem Quad 5 5   DF L4/5 D Peron  Tib Ant 5 5   PF S1/2 Tibial Grc/Sol 5 5    Sensation:  Light touch Intact throughout   Pin prick    Temperature    Vibration   Proprioception    Coordination/Complex Motor:  - Finger to Nose intact BL - Heel to shin intact BL - Rapid alternating movement are normal - Gait: deferred for patient safety.  Labs   CBC:  Recent Labs  Lab 04/13/22 1707 04/13/22 1730 04/13/22 1731 04/13/22 2235  WBC 2.3*  --   --  3.0*  NEUTROABS 1.1*  --   --   --   HGB 13.1   < > 13.3 13.4  HCT 40.1   < > 39.0 41.3  MCV 94.4  --   --  94.1  PLT 76*  --   --  77*   < > = values in this interval not displayed.    Basic Metabolic Panel:  Lab Results  Component Value Date   NA 141 04/13/2022   K 3.7 04/13/2022   CO2 28 04/13/2022   GLUCOSE 116 (H) 04/13/2022   BUN 14 04/13/2022   CREATININE 0.80 04/13/2022   CALCIUM 8.2 (L) 04/13/2022   GFRNONAA >60 04/13/2022   GFRAA >60 12/17/2018   Lipid Panel: No results found for: "LDLCALC" HgbA1c:  Lab Results  Component Value Date   HGBA1C 5.5 12/03/2017   Urine Drug Screen: No results found for: "LABOPIA", "COCAINSCRNUR", "LABBENZ", "AMPHETMU", "THCU", "LABBARB"  Alcohol Level     Component Value Date/Time   ETH <10 04/13/2022 1707    CT Head without contrast(Personally reviewed): CTH was negative for a large hypodensity concerning for a large territory infarct or hyperdensity concerning for an ICH  CT angio Head and Neck with contrast(Personally reviewed): pending  MRI Brain(Personally reviewed): Punctate acute infarcts in the right cerebellum, midbrain, left occipital lobe, and right parietal lobe. Given multiple vascular territories, a central embolic etiology is suspected.  Impression   Whitney [name] is a 66 y.o. female with PMH significant for DM2, GERD, cirrhosis, Sjogrens syndrome, OSA who presents with tingling in her extremities, unsteady gait and diplopia. Symptoms started last evening around 1900. She went to eat out  this AM and noticed she was unsteady and nauseated. Her neurologic examination is notable for no focal deficit. MRI brain demonstrated punctate strokes in multiple vascular distributions concerning for an embolic phenomena.  Also endorses neuropathy that has been gradually progressive.  Recommendations   - Frequent Neuro checks per stroke unit protocol - Recommend Vascular imaging with CT angio head and neck - Recommend obtaining TTE  - Recommend obtaining Lipid panel with LDL - May not be able to tolerate statin with cirrhosis. Consider PCSK9 inhibitors outpatient if LDL above goal. - Recommend HbA1c - Antithrombotic - Aspirin 64m daily along with plavix 767mdaily for 21  days, followed by Aspirin 51m daily alone. - Recommend DVT ppx - SBP goal - permissive hypertension first 24 h < 220/110. Held home meds.  - Recommend Telemetry monitoring for arrythmia - Recommend bedside swallow screen prior to PO intake. - Stroke education booklet - Recommend PT/OT/SLP consult   Gradually progressive Neuropathy: - recent TSH, vit b12 levels, folate and MMA were normal -Vit B1, Vit B6, SPEP, HbA1c. - Lidocaine cream PRN at bedtime for neuropathy - would benefit from outpatient neurology follow up for this too with EMG/NCS. ______________________________________________________________________   Thank you for the opportunity to take part in the care of this patient. If you have any further questions, please contact the neurology consultation attending.  Signed,  SElizabethtownPager Number [phone]_ _ _   _ __   _ __ _ _  __ __   _ __   __ _

## 2022-04-13 NOTE — ED Triage Notes (Signed)
Pt reports about 7 or 730 last night she began having double vision, unsteady gait, and tingling in her extremities.  Pt states she went to eat with a friend this morning and had some more feelings of unsteadiness however her vision is no longer blurry but she does feel nauseated.  Triage PA in to evaluate patient.

## 2022-04-13 NOTE — ED Provider Triage Note (Signed)
Emergency Medicine Provider Triage Evaluation Note  Whitney [name] , a 66 y.o. female  was evaluated in triage.  Pt complains of unsteadiness on her feet, double vision.  Patient reports the symptoms began last night.  The patient states that she was sitting down watching TV when double vision began.  Patient states she tried to get up and go to her bed to go to sleep however this was very difficult for her.  Patient states that when she got to her bed, she must of fallen asleep immediately because she did not take her nighttime medications, did not her CPAP machine.  Patient reports she woke up this morning, was feeling slightly less unsteady on her feet and went to breakfast with her friend.  At breakfast, the patient states that her unsteadiness on her feet increased.  Patient states that when she got home from breakfast, she fell asleep in her car for 1 hour and she is unsure why.  The patient reports that she then woke up, let her daughter know this had occurred and presented to the ED for evaluation.  Patient does endorse history of vertigo 4 times in the past.  Patient denies any one-sided weakness or numbness, facial droop, slurred speech however her daughter who is at the bedside states that she does feel as if her mother is slurring her speech.  Patient is outside of the window for activated code stroke however I have called CT and have had the patient transported over for immediate head CT.  Labs been initiated, the patient will require a room immediately which has been communicated to charge nurse.  Review of Systems  Positive:  Negative:   Physical Exam  BP (!) 164/85 (BP Location: Right Arm)   Pulse 88   Temp 98.7 F (37.1 C)   Resp 20   SpO2 98%  Gen:   Awake, no distress   Resp:  Normal effort  MSK:   Moves extremities without difficulty  Other:  Normal neurological examination  Medical Decision Making  Medically screening exam initiated at 5:24 PM.  Appropriate orders placed.   Whitney [name] was informed that the remainder of the evaluation will be completed by another provider, this initial triage assessment does not replace that evaluation, and the importance of remaining in the ED until their evaluation is complete.     Whitney Cecil, PA-C 04/13/22 1726

## 2022-04-14 ENCOUNTER — Inpatient Hospital Stay (HOSPITAL_COMMUNITY): Payer: Medicare PPO

## 2022-04-14 ENCOUNTER — Other Ambulatory Visit (HOSPITAL_COMMUNITY): Payer: Medicare PPO

## 2022-04-14 DIAGNOSIS — K7581 Nonalcoholic steatohepatitis (NASH): Secondary | ICD-10-CM

## 2022-04-14 DIAGNOSIS — I639 Cerebral infarction, unspecified: Secondary | ICD-10-CM | POA: Diagnosis not present

## 2022-04-14 DIAGNOSIS — K7460 Unspecified cirrhosis of liver: Secondary | ICD-10-CM | POA: Diagnosis not present

## 2022-04-14 DIAGNOSIS — I6389 Other cerebral infarction: Secondary | ICD-10-CM

## 2022-04-14 DIAGNOSIS — R509 Fever, unspecified: Secondary | ICD-10-CM | POA: Diagnosis not present

## 2022-04-14 LAB — COMPREHENSIVE METABOLIC PANEL
ALT: 13 U/L (ref 0–44)
AST: 40 U/L (ref 15–41)
Albumin: 2.5 g/dL — ABNORMAL LOW (ref 3.5–5.0)
Alkaline Phosphatase: 54 U/L (ref 38–126)
Anion gap: 9 (ref 5–15)
BUN: 15 mg/dL (ref 8–23)
CO2: 24 mmol/L (ref 22–32)
Calcium: 8 mg/dL — ABNORMAL LOW (ref 8.9–10.3)
Chloride: 105 mmol/L (ref 98–111)
Creatinine, Ser: 0.81 mg/dL (ref 0.44–1.00)
GFR, Estimated: >60 mL/min (ref >60)
Glucose, Bld: 83 mg/dL (ref 70–99)
Potassium: 4.4 mmol/L (ref 3.5–5.1)
Sodium: 138 mmol/L (ref 135–145)
Total Bilirubin: 1.1 mg/dL (ref 0.3–1.2)
Total Protein: 5.9 g/dL — ABNORMAL LOW (ref 6.5–8.1)

## 2022-04-14 LAB — BLOOD GAS, VENOUS
Acid-Base Excess: 2.6 mmol/L — ABNORMAL HIGH (ref 0.0–2.0)
Bicarbonate: 27.2 mmol/L (ref 20.0–28.0)
Drawn by: 331851
O2 Saturation: 97.1 %
Patient temperature: 39.4
pCO2, Ven: 46 mmHg (ref 44–60)
pH, Ven: 7.39 (ref 7.25–7.43)
pO2, Ven: 89 mmHg — ABNORMAL HIGH (ref 32–45)

## 2022-04-14 LAB — RESPIRATORY PANEL BY PCR

## 2022-04-14 LAB — LIPID PANEL
Cholesterol: 140 mg/dL (ref 0–200)
HDL: 29 mg/dL — ABNORMAL LOW (ref >40)
LDL Cholesterol: 97 mg/dL (ref 0–99)
Total CHOL/HDL Ratio: 4.8 RATIO
Triglycerides: 70 mg/dL (ref <150)
VLDL: 14 mg/dL (ref 0–40)

## 2022-04-14 LAB — RAPID URINE DRUG SCREEN, HOSP PERFORMED
Amphetamines: NOT DETECTED
Barbiturates: NOT DETECTED
Benzodiazepines: NOT DETECTED
Cocaine: NOT DETECTED
Opiates: NOT DETECTED
Tetrahydrocannabinol: NOT DETECTED

## 2022-04-14 LAB — FOLATE
Folate: 10.2 ng/mL (ref >5.9)
Folate: 10 ng/mL (ref >5.9)

## 2022-04-14 LAB — BRAIN NATRIURETIC PEPTIDE: B Natriuretic Peptide: 293.6 pg/mL — ABNORMAL HIGH (ref 0.0–100.0)

## 2022-04-14 LAB — GROUP A STREP BY PCR: Group A Strep by PCR: NOT DETECTED

## 2022-04-14 LAB — C-REACTIVE PROTEIN: CRP: 0.9 mg/dL (ref <1.0)

## 2022-04-14 LAB — ETHANOL: Alcohol, Ethyl (B): <10 mg/dL (ref <10)

## 2022-04-14 LAB — PROCALCITONIN: Procalcitonin: <0.1 ng/mL

## 2022-04-14 LAB — GLUCOSE, CAPILLARY: Glucose-Capillary: 91 mg/dL (ref 70–99)

## 2022-04-14 MED ORDER — IOHEXOL 350 MG/ML SOLN
75.0000 mL | Freq: Once | INTRAVENOUS | Status: AC | PRN
  Administered 2022-04-14: 75 mL via INTRAVENOUS

## 2022-04-14 NOTE — Plan of Care (Signed)
  Problem: Education: Goal: Knowledge of disease or condition will improve Outcome: Progressing   Problem: Health Behavior/Discharge Planning: Goal: Ability to manage health-related needs will improve Outcome: Progressing   Problem: Self-Care: Goal: Ability to participate in self-care as condition permits will improve Outcome: Progressing

## 2022-04-14 NOTE — Progress Notes (Signed)
STROKE TEAM PROGRESS NOTE   SUBJECTIVE (INTERVAL HISTORY) Her daughter is at the bedside.  Overall her condition is gradually improving.  Patient stated that at time of onset, she had right fingers and bilateral feet tingling, imbalance, diplopia, nausea but now much improved.  Overnight spiking fever 102.9, currently afebrile.  Blood culture pending.   OBJECTIVE Temp:  [98.6 F (37 C)-102.9 F (39.4 C)] 98.6 F (37 C) (12/09 1200) Pulse Rate:  [80-93] 81 (12/09 1200) Cardiac Rhythm: Normal sinus rhythm (12/09 0741) Resp:  [4-30] 16 (12/09 1200) BP: (109-153)/(62-89) 125/73 (12/09 1200) SpO2:  [35 %-99 %] 94 % (12/09 1200) FiO2 (%):  [32 %] 32 % (12/09 0243)  Recent Labs  Lab 04/14/22 0041  GLUCAP 91   Recent Labs  Lab 04/13/22 1707 04/13/22 1730 04/13/22 1731 04/13/22 2235  NA 139 141 141 138  K 3.9 3.7 3.7 4.4  CL 104 102  --  105  CO2 28  --   --  24  GLUCOSE 122* 116*  --  83  BUN 14 14  --  15  CREATININE 0.88 0.80  --  0.81  CALCIUM 8.2*  --   --  8.0*  MG 1.8  --   --   --    Recent Labs  Lab 04/13/22 1707 04/13/22 2235  AST 31 40  ALT 12 13  ALKPHOS 55 54  BILITOT 0.9 1.1  PROT 5.6* 5.9*  ALBUMIN 2.4* 2.5*   Recent Labs  Lab 04/13/22 1707 04/13/22 1730 04/13/22 1731 04/13/22 2235  WBC 2.3*  --   --  3.0*  NEUTROABS 1.1*  --   --   --   HGB 13.1 12.9 13.3 13.4  HCT 40.1 38.0 39.0 41.3  MCV 94.4  --   --  94.1  PLT 76*  --   --  77*   No results for input(s): "CKTOTAL", "CKMB", "CKMBINDEX", "TROPONINI" in the last 168 hours. Recent Labs    04/13/22 1707  LABPROT 15.6*  INR 1.3*   Recent Labs    04/13/22 1651  COLORURINE YELLOW  LABSPEC 1.024  PHURINE 5.0  GLUCOSEU NEGATIVE  HGBUR SMALL*  BILIRUBINUR NEGATIVE  KETONESUR NEGATIVE  PROTEINUR 100*  NITRITE NEGATIVE  LEUKOCYTESUR NEGATIVE       Component Value Date/Time   CHOL 140 04/14/2022 0242   TRIG 70 04/14/2022 0242   HDL 29 (L) 04/14/2022 0242   CHOLHDL 4.8 04/14/2022  0242   VLDL 14 04/14/2022 0242   LDLCALC 97 04/14/2022 0242   Lab Results  Component Value Date   HGBA1C 5.5 12/03/2017      Component Value Date/Time   LABOPIA NONE DETECTED 04/13/2022 1651   COCAINSCRNUR NONE DETECTED 04/13/2022 1651   LABBENZ NONE DETECTED 04/13/2022 1651   AMPHETMU NONE DETECTED 04/13/2022 1651   THCU NONE DETECTED 04/13/2022 1651   LABBARB NONE DETECTED 04/13/2022 1651    Recent Labs  Lab 04/13/22 1707 04/13/22 2235  ETH <10 <10    I have personally reviewed the radiological images below and agree with the radiology interpretations.  CT Chest High Resolution  Result Date: 04/14/2022 CLINICAL DATA:  Shortness of breath EXAM: CT CHEST WITHOUT CONTRAST TECHNIQUE: Multidetector CT imaging of the chest was performed following the standard protocol without intravenous contrast. High resolution imaging of the lungs, as well as inspiratory and expiratory imaging, was performed. RADIATION DOSE REDUCTION: This exam was performed according to the departmental dose-optimization program which includes automated exposure control, adjustment of the  mA and/or kV according to patient size and/or use of iterative reconstruction technique. COMPARISON:  CT abdomen pelvis, 05/15/2019 FINDINGS: Cardiovascular: Aortic atherosclerosis. Aortic valve prosthesis. Cardiomegaly. Left and right coronary artery calcifications. Dense mitral annulus calcifications. Enlargement of the main pulmonary artery measuring up to 3.8 cm in caliber. No pericardial effusion. Mediastinum/Nodes: No enlarged mediastinal, hilar, or axillary lymph nodes. Thyroid gland, trachea, and esophagus demonstrate no significant findings. Lungs/Pleura: Examination of the lungs is limited by breath motion artifact and the presence of pleural effusions. Trace bilateral pleural effusions and associated atelectasis or consolidation. Scarring and volume loss of the right lung base with elevation of the right hemidiaphragm,  unchanged compared to prior examination. No specific evidence of fibrotic interstitial lung disease. Upper Abdomen: No acute abnormality. Cirrhosis and splenomegaly. Partially imaged sleeve gastrectomy. Musculoskeletal: No chest wall abnormality. No acute osseous findings. IMPRESSION: 1. Examination of the lungs is limited by breath motion artifact and the presence of pleural effusions. In general, interstitial lung disease protocol CT is of limited utility in the acute inpatient setting and should be deferred to outpatient evaluation. 2. Trace bilateral pleural effusions and associated atelectasis or consolidation. Bland appearing post infectious or inflammatory scarring and volume loss of the right lung base with elevation of the right hemidiaphragm, unchanged compared to the lung bases as included on prior examination of the abdomen. No specific evidence of fibrotic interstitial lung disease. 3. Cardiomegaly and coronary artery disease. 4. Enlargement of the main pulmonary artery, as can be seen in pulmonary hypertension. 5. Cirrhosis and splenomegaly. Aortic Atherosclerosis (ICD10-I70.0). Electronically Signed   By: Delanna Ahmadi M.D.   On: 04/14/2022 12:47   CT ANGIO HEAD NECK W WO CM  Result Date: 04/14/2022 CLINICAL DATA:  66 year old female with double vision. Scattered punctate infarcts in the brainstem, cerebellum, left occipital lobe on MRI yesterday. EXAM: CT ANGIOGRAPHY HEAD AND NECK TECHNIQUE: Multidetector CT imaging of the head and neck was performed using the standard protocol during bolus administration of intravenous contrast. Multiplanar CT image reconstructions and MIPs were obtained to evaluate the vascular anatomy. Carotid stenosis measurements (when applicable) are obtained utilizing NASCET criteria, using the distal internal carotid diameter as the denominator. RADIATION DOSE REDUCTION: This exam was performed according to the departmental dose-optimization program which includes  automated exposure control, adjustment of the mA and/or kV according to patient size and/or use of iterative reconstruction technique. CONTRAST:  66m OMNIPAQUE IOHEXOL 350 MG/ML SOLN COMPARISON:  Head CT and brain MRI yesterday. FINDINGS: CT HEAD Brain: Multiple punctate ischemic foci detected in the brainstem, right cerebellum, and left occipital lobe remain occult by CT. No midline shift, ventriculomegaly, mass effect, evidence of mass lesion, intracranial hemorrhage or evidence of cortically based acute infarction. Calvarium and skull base: No acute osseous abnormality identified. Paranasal sinuses: Visualized paranasal sinuses and mastoids are stable and well aerated. Incidental left ethmoid osteoma (normal variant). Orbits: No acute orbit or scalp soft tissue finding. CTA NECK Skeleton: Prior sternotomy. Motion artifact in the cervical spine and at the mandible. No acute osseous abnormality identified. Upper chest: Bilateral upper lung mosaic attenuation. No superior mediastinal lymphadenopathy. Visible central pulmonary arteries are patent. Other neck: Bilateral salivary gland atrophy and extensive parotid sialolithiasis or dystrophic calcifications. Motion artifact in the neck. No neck mass or lymphadenopathy is evident. Aortic arch: Calcified aortic atherosclerosis. Prior CABG. Three vessel arch configuration. Tortuous arch. Right carotid system: No significant brachiocephalic artery plaque or stenosis. Proximal right CCA obscured by dense subclavian venous contrast streak. But the  visible right CCA is patent before the bifurcation with tortuosity but no plaque. Partially retropharyngeal course. Retropharyngeal right carotid bifurcation. Mild calcified plaque at the right ICA bulb without stenosis. Motion artifact but no evidence of right ICA stenosis to the skull base. Additional tortuosity below the skull base. Left carotid system: Mild left CCA origin calcified plaque without stenosis. Partially  retropharyngeal course. Mild calcified plaque at the left ICA origin without stenosis. Motion artifact and tortuosity but no stenosis identified. Vertebral arteries: Proximal right subclavian artery appears normal. Right vertebral artery origin partially obscured by motion and streak artifact, not well evaluated. Right vertebral artery is mildly non dominant and tortuous. Motion artifact through the mid cervical spine. The right vertebral remains patent to the skull base, no obvious stenosis. Proximal left subclavian calcified plaque without stenosis. Normal left vertebral artery origin. Tortuous left V1 segment. Mildly dominant left vertebral artery with some of the V2 segment obscured by motion artifact but patent to the skull base with no obvious stenosis. CTA HEAD Posterior circulation: Vertebral V4 segments appear normal to the vertebrobasilar junction. The left is mildly dominant. Both PICA origins are patent. Patent basilar artery without stenosis. Mild motion artifact at the basilar tip. Fetal type bilateral PCA origins. SCA origins are patent. Bilateral PCA branch motion artifact. No PCA occlusion or obvious stenosis. Anterior circulation: Both ICA siphons are patent. Mild motion artifact. Mild to moderate cavernous segment plaque bilaterally. Difficult to exclude moderate bilateral siphon stenosis but probably exaggerated from motion artifact. Both posterior communicating artery origins appear patent and normal. Patent carotid termini. Normal MCA and ACA origins. Normal anterior communicating artery. Bilateral ACA branches appear normal. Bilateral MCA M1 segments and bifurcations are patent without stenosis. Bilateral MCA branches are within normal limits. Venous sinuses: Early contrast timing, not well evaluated. Anatomic variants: Fetal type bilateral PCA origins. Mildly dominant left vertebral artery. Review of the MIP images confirms the above findings IMPRESSION: 1. Intermittently motion degraded  exam. No large vessel occlusion, and generally mild for age atherosclerosis in the head and neck. No obvious hemodynamically significant arterial stenosis. 2. Note fetal type bilateral PCA origins, in conjunction with the pattern of punctate infarcts involving the posterior fossa but also the left PCA territory, this may indicate a systemic embolic event from the heart or proximal aorta. 3. Punctate infarcts in #2 remain occult by CT. No new intracranial abnormality. 4. Prior CABG.  Aortic Atherosclerosis (ICD10-I70.0). 5. Generalized salivary gland atrophy with dystrophic calcifications or sialolithiasis. Electronically Signed   By: Genevie Ann M.D.   On: 04/14/2022 06:03   DG Chest 2 View  Result Date: 04/13/2022 CLINICAL DATA:  Short of breath EXAM: CHEST - 2 VIEW COMPARISON:  12/17/2018 FINDINGS: Frontal and lateral views of the chest demonstrate postsurgical changes from median sternotomy and aortic valve replacement. The cardiac silhouette is enlarged. There is central vascular congestion and mild diffuse interstitial prominence, without airspace disease, effusion, or pneumothorax. Chronic elevation right hemidiaphragm. No acute bony abnormalities. IMPRESSION: 1. Findings consistent with congestive heart failure and mild interstitial edema. Electronically Signed   By: Randa Ngo M.D.   On: 04/13/2022 19:40   MR BRAIN WO CONTRAST  Result Date: 04/13/2022 CLINICAL DATA:  Double vision, syncope, unsteady on her feet EXAM: MRI HEAD WITHOUT CONTRAST TECHNIQUE: Multiplanar, multiecho pulse sequences of the brain and surrounding structures were obtained without intravenous contrast. COMPARISON:  12/17/2018 MRI head, correlation is also made with 04/13/2022 CT head FINDINGS: Brain: Punctate foci of restricted diffusion with ADC  correlates in the right cerebellum (series 9, images 60 and 69), midbrain (series 9, images 75-77), left occipital lobe (series 9, images 82 and 84), and right parietal lobe (series 9,  image 93). No acute hemorrhage, mass, mass effect, or midline shift. Punctate focus of hemosiderin deposition in the medial left parietal lobe, possibly sequela of prior microhemorrhage or tiny embolus. No hydrocephalus or extra-axial collection. Scattered T2 hyperintense signal in the periventricular white matter, likely the sequela of chronic small vessel ischemic disease. Partial empty sella. Normal craniocervical junction. Vascular: Normal arterial flow voids. Skull and upper cervical spine: Normal marrow signal. Sinuses/Orbits: Minimal mucosal thickening in the ethmoid air cells. The orbits are unremarkable. Other: The mastoids are well aerated. IMPRESSION: Punctate acute infarcts in the right cerebellum, midbrain, left occipital lobe, and right parietal lobe. Given multiple vascular territories, a central embolic etiology is suspected. These results were called by telephone at the time of interpretation on 04/13/2022 at 7:33 pm to provider MILLER, who verbally acknowledged these results. Electronically Signed   By: Merilyn Baba M.D.   On: 04/13/2022 19:33   CT HEAD WO CONTRAST  Result Date: 04/13/2022 CLINICAL DATA:  Double vision, unsteady gait, tingling in extremities EXAM: CT HEAD WITHOUT CONTRAST TECHNIQUE: Contiguous axial images were obtained from the base of the skull through the vertex without intravenous contrast. RADIATION DOSE REDUCTION: This exam was performed according to the departmental dose-optimization program which includes automated exposure control, adjustment of the mA and/or kV according to patient size and/or use of iterative reconstruction technique. COMPARISON:  12/17/2018 FINDINGS: Brain: No acute infarct or hemorrhage. Lateral ventricles and midline structures are unremarkable. No acute extra-axial fluid collections. No mass effect. Vascular: No hyperdense vessel or unexpected calcification. Skull: Normal. Negative for fracture or focal lesion. Sinuses/Orbits: No acute finding.  Stable osteoma left ethmoid air cell. Other: None. IMPRESSION: 1. No acute intracranial process. Electronically Signed   By: Randa Ngo M.D.   On: 04/13/2022 17:43     PHYSICAL EXAM  Temp:  [98.6 F (37 C)-102.9 F (39.4 C)] 98.6 F (37 C) (12/09 1200) Pulse Rate:  [80-93] 81 (12/09 1200) Resp:  [4-30] 16 (12/09 1200) BP: (109-153)/(62-89) 125/73 (12/09 1200) SpO2:  [35 %-99 %] 94 % (12/09 1200) FiO2 (%):  [32 %] 32 % (12/09 0243)  General - obese, well developed, in no apparent distress.  Ophthalmologic - fundi not visualized due to noncooperation.  Cardiovascular - Regular rhythm and rate.  Mental Status -  Level of arousal and orientation to time, place, and person were intact. Language including expression, naming, repetition, comprehension was assessed and found intact. Fund of Knowledge was assessed and was intact.  Cranial Nerves II - XII - II - Visual field intact OU. III, IV, VI - Extraocular movements intact.  No disconjugate V - Facial sensation intact bilaterally. VII - Facial movement intact bilaterally. VIII - Hearing & vestibular intact bilaterally.  No nystagmus X - Palate elevates symmetrically. XI - Chin turning & shoulder shrug intact bilaterally. XII - Tongue protrusion intact.  Motor Strength - The patient's strength was symmetrical in all extremities and pronator drift was absent.  Bulk was normal and fasciculations were absent.   Motor Tone - Muscle tone was assessed at the neck and appendages and was normal.  Reflexes - The patient's reflexes were symmetrical in all extremities and she had no pathological reflexes.  Sensory - Light touch, temperature/pinprick were assessed and were symmetrical.    Coordination - The patient had normal  movements in the hands with no ataxia or dysmetria.  Tremor was absent.  Gait and Station - deferred.   ASSESSMENT/PLAN Whitney [name] is a 66 y.o. female with history of diabetes, cirrhosis, Sjogren's  disease, OSA, remote rheumatic fever with AVR admitted for episode of imbalance, diplopia, nausea and numbness. No tPA given due to outside window.    Stroke: Scattered punctate infarcts at right cerebellum, midbrain, left occipital and right parietal, etiology unclear, cardioembolic versus endocarditis CT no acute abnormality CTA head and neck bilateral fetal PCAs MRI right cerebellum, midbrain, left occipital, right parietal punctate infarcts 2D Echo pending 10/2020 30-day heart monitoring showed short runs of PACs Recommend loop recorder if endocarditis ruled out LDL 97 HgbA1c pending Heparin subcu for VTE prophylaxis No antithrombotic prior to admission, now on aspirin 81 mg daily.  Will consider DAPT once endocarditis ruled out.   Patient counseled to be compliant with her antithrombotic medications Ongoing aggressive stroke risk factor management Therapy recommendations: Home health PT Disposition: Pending  Fever Tmax 102.9 WBC 2.3->3.0 Blood culture pending 2D echo pending to rule out endocarditis  Diabetes HgbA1c pending goal < 7.0 Controlled CBG monitoring SSI DM education and close PCP follow up  Hypertension Stable Long term BP goal normotensive  Hyperlipidemia Home meds: Zetia LDL 97, goal < 70 Now on Zetia Not a candidate for statin due to cirrhosis Continue Zetia at discharge  Other Stroke Risk Factors Advanced age ETOH use, recommend no more than 1 drink per day Obesity, Body mass index is 37.09 kg/m.  Obstructive sleep apnea  Other Active Problems Sjogren's disease Cirrhosis  Hospital day # 1    Rosalin Hawking, MD PhD Stroke Neurology 04/14/2022 6:58 PM    To contact Stroke Continuity provider, please refer to [email or web address] After hours, contact General Neurology

## 2022-04-14 NOTE — Progress Notes (Signed)
[facility] PROGRESS NOTE   [name] MEB:[id number] DOB: 05-Mar-1956 DOA: 04/13/2022  PCP: Lawerance Cruel, MD  Brief History/Interval Summary: 66 y.o. female with medical history significant of  Anemia, Asthma, Cirrhosis,DMII, GERD, Depression, fibromyalgia, hx of heart murmur (due to rheumatic fever 1989)  s/p aortic valve replacement 2019, Sjogren's syndrome, hx of OSA on cpap patient presented to the ED with complaint of double vision, unsteady gate, feeling of disequilibrium as well as paresthesias in b/l fingers and mouth.    Consultants: Neurology  Procedures: Transthoracic echocardiogram is pending    Subjective/Interval History: Patient noted to be febrile overnight.  Complains of sore throat.  Has not noted to cough.  Denies any dysuria, no diarrhea.  No joint pains more than usual or skin rashes.   Assessment/Plan:  Acute stroke Multiple areas of infarct noted on MRI.  Concern for cardioembolic etiology. Neurology has been consulted. Patient underwent MRI brain and CT angiogram head and neck. Echocardiogram is pending. LDL is 97.  HbA1c is pending. Patient noted to be on Zetia.  Due to her history of NASH there was some concern about initiating statin.  Will wait for neurology input regarding this. Patient started on aspirin.  Plavix was recommended by neurology.  Looks like this was initiated but then subsequently discontinued. Will wait on neurology input. PT OT SLP evaluation Patient's symptoms included double vision disequilibrium and paresthesias of hands.  Fever Noted to have leukopenia which is chronic for her.  No obvious source of infection identified.  UA was noted to be unremarkable.  Patient does not have any diarrhea.   COVID-19 RSV and influenza PCR's were negative.  Will send respiratory viral panel.  Will also order strep screen since she does complain of sore throat. Procalcitonin noted to be less than 0.1. Since no clear etiology for  fever is found and she appears to be stable and procalcitonin is less than 0.1, we will hold off on antibiotics.    Abnormal chest x-ray Chest x-ray suggested pulmonary edema.  BNP is only 293.  CT chest was ordered and is pending.  Patient is saturating normal on room air.  Noted to be febrile as mentioned.  Respiratory viral panel is pending.  History of asthma Stable.  No wheezing appreciated.  History of liver cirrhosis secondary to NASH Followed at [facility].  Has associated leukopenia and thrombocytopenia.  LFTs are stable. Nadolol on hold due to permissive hypertension needed for acute stroke.  Leukopenia/thrombocytopenia Presumably related to NASH.  Followed at [facility].  Diabetes mellitus type 2 Follow-up HbA1c.  SSI.  GERD PPI  History of rheumatic fever/aortic valve replacement in 2019 Followed by Dr. Quitman Livings with cardiology.  Follow-up on echocardiogram.  History of Sjogren's syndrome Stable  History of sleep apnea CPAP  Obesity Estimated body mass index is 37.09 kg/m as calculated from the following:   Height as of this encounter: 5' 4"  (1.626 m).   Weight as of this encounter: 98 kg.   DVT Prophylaxis: Subcutaneous heparin Code Status: Full code Family Communication: Discussed with patient's daughter Disposition Plan: To be determined  Status is: Inpatient Remains inpatient appropriate because: Acute stroke     Medications: Scheduled:   stroke: early stages of recovery book   Does not apply Once   arformoterol  15 mcg Nebulization BID   And   umeclidinium bromide  1 puff Inhalation Daily   aspirin  81 mg Oral Daily   cyanocobalamin  1,000 mcg Intramuscular Once  ezetimibe  10 mg Oral QHS   heparin  2,500 Units Subcutaneous Q8H   Continuous: NLG:[id number] **OR** acetaminophen (TYLENOL) oral liquid 160 mg/5 mL **OR** acetaminophen, albuterol, senna-docusate  Antibiotics: Anti-infectives (From admission, onward)    None        Objective:  Vital Signs  Vitals:   04/14/22 0243 04/14/22 0400 04/14/22 0617 04/14/22 0752  BP:  109/64 112/73 110/65  Pulse: 92 90 81 83  Resp: 16 18 16 18   Temp:  (!) 100.9 F (38.3 C) 99.1 F (37.3 C) 98.7 F (37.1 C)  TempSrc:  Oral Oral Oral  SpO2: 98% 99% 94% 92%  Weight:      Height:       No intake or output data in the 24 hours ending 04/14/22 1002 Filed Weights   04/13/22 1753  Weight: 98 kg    General appearance: Awake alert.  In no distress Resp: Clear to auscultation bilaterally.  Normal effort Cardio: S1-S2 is normal regular.  No murmur appreciated over the precordium GI: Abdomen is soft.  Nontender nondistended.  Bowel sounds are present normal.  No masses organomegaly Extremities: No edema.   Neurologic: Alert and oriented x3.  No facial asymmetry noted.  Physical deconditioning is noted.  No obvious focal neurological deficits.  She does have left lower extremity weakness which she attributes to bursitis.   Lab Results:  Data Reviewed: I have personally reviewed following labs and reports of the imaging studies  CBC: Recent Labs  Lab 04/13/22 1707 04/13/22 1730 04/13/22 1731 04/13/22 2235  WBC 2.3*  --   --  3.0*  NEUTROABS 1.1*  --   --   --   HGB 13.1 12.9 13.3 13.4  HCT 40.1 38.0 39.0 41.3  MCV 94.4  --   --  94.1  PLT 76*  --   --  77*    Basic Metabolic Panel: Recent Labs  Lab 04/13/22 1707 04/13/22 1730 04/13/22 1731 04/13/22 2235  NA 139 141 141 138  K 3.9 3.7 3.7 4.4  CL 104 102  --  105  CO2 28  --   --  24  GLUCOSE 122* 116*  --  83  BUN 14 14  --  15  CREATININE 0.88 0.80  --  0.81  CALCIUM 8.2*  --   --  8.0*  MG 1.8  --   --   --     GFR: Estimated Creatinine Clearance: 75.6 mL/min (by C-G formula based on SCr of 0.81 mg/dL).  Liver Function Tests: Recent Labs  Lab 04/13/22 1707 04/13/22 2235  AST 31 40  ALT 12 13  ALKPHOS 55 54  BILITOT 0.9 1.1  PROT 5.6* 5.9*  ALBUMIN 2.4* 2.5*     Coagulation  Profile: Recent Labs  Lab 04/13/22 1707  INR 1.3*    CBG: Recent Labs  Lab 04/14/22 0041  GLUCAP 91    Lipid Profile: Recent Labs    04/14/22 0242  CHOL 140  HDL 29*  LDLCALC 97  TRIG 70  CHOLHDL 4.8     Anemia Panel: Recent Labs    04/13/22 2235 04/14/22 0242  FOLATE 10.2 10.0    Recent Results (from the past 240 hour(s))  Resp panel by RT-PCR (RSV, Flu A&B, Covid) Anterior Nasal Swab     Status: None   Collection Time: 04/13/22  6:07 PM   Specimen: Anterior Nasal Swab  Result Value Ref Range Status   SARS Coronavirus 2 by RT PCR NEGATIVE NEGATIVE  Final    Comment: (NOTE) SARS-CoV-2 target nucleic acids are NOT DETECTED.  The SARS-CoV-2 RNA is generally detectable in upper respiratory specimens during the acute phase of infection. The lowest concentration of SARS-CoV-2 viral copies this assay can detect is 138 copies/mL. A negative result does not preclude SARS-Cov-2 infection and should not be used as the sole basis for treatment or other patient management decisions. A negative result may occur with  improper specimen collection/handling, submission of specimen other than nasopharyngeal swab, presence of viral mutation(s) within the areas targeted by this assay, and inadequate number of viral copies(<138 copies/mL). A negative result must be combined with clinical observations, patient history, and epidemiological information. The expected result is Negative.  Fact Sheet for Patients:  EntrepreneurPulse.com.au  Fact Sheet for Healthcare Providers:  IncredibleEmployment.be  This test is no t yet approved or cleared by the [place] FDA and  has been authorized for detection and/or diagnosis of SARS-CoV-2 by FDA under an Emergency Use Authorization (EUA). This EUA will remain  in effect (meaning this test can be used) for the duration of the COVID-19 declaration under Section 564(b)(1) of the Act,  21 U.S.C.section 360bbb-3(b)(1), unless the authorization is terminated  or revoked sooner.       Influenza A by PCR NEGATIVE NEGATIVE Final   Influenza B by PCR NEGATIVE NEGATIVE Final    Comment: (NOTE) The Xpert Xpress SARS-CoV-2/FLU/RSV plus assay is intended as an aid in the diagnosis of influenza from Nasopharyngeal swab specimens and should not be used as a sole basis for treatment. Nasal washings and aspirates are unacceptable for Xpert Xpress SARS-CoV-2/FLU/RSV testing.  Fact Sheet for Patients: EntrepreneurPulse.com.au  Fact Sheet for Healthcare Providers: IncredibleEmployment.be  This test is not yet approved or cleared by the [place] FDA and has been authorized for detection and/or diagnosis of SARS-CoV-2 by FDA under an Emergency Use Authorization (EUA). This EUA will remain in effect (meaning this test can be used) for the duration of the COVID-19 declaration under Section 564(b)(1) of the Act, 21 U.S.C. section 360bbb-3(b)(1), unless the authorization is terminated or revoked.     Resp Syncytial Virus by PCR NEGATIVE NEGATIVE Final    Comment: (NOTE) Fact Sheet for Patients: EntrepreneurPulse.com.au  Fact Sheet for Healthcare Providers: IncredibleEmployment.be  This test is not yet approved or cleared by the [place] FDA and has been authorized for detection and/or diagnosis of SARS-CoV-2 by FDA under an Emergency Use Authorization (EUA). This EUA will remain in effect (meaning this test can be used) for the duration of the COVID-19 declaration under Section 564(b)(1) of the Act, 21 U.S.C. section 360bbb-3(b)(1), unless the authorization is terminated or revoked.  Performed at [facility], [place] [address], [place], [place] [address]   Culture, blood (Routine X 2) w Reflex to ID Panel     Status: None (Preliminary result)   Collection Time: 04/13/22  9:02 PM    Specimen: BLOOD  Result Value Ref Range Status   Specimen Description BLOOD SITE NOT SPECIFIED  Final   Special Requests   Final    BOTTLES DRAWN AEROBIC AND ANAEROBIC Blood Culture results may not be optimal due to an inadequate volume of blood received in culture bottles   Culture   Final    NO GROWTH < 12 HOURS Performed at [facility], [place] [address] [place], [place] [address]    Report Status PENDING  Incomplete  Culture, blood (Routine X 2) w Reflex to ID Panel  Status: None (Preliminary result)   Collection Time: 04/14/22  2:36 AM   Specimen: BLOOD  Result Value Ref Range Status   Specimen Description BLOOD BLOOD LEFT HAND  Final   Special Requests   Final    BOTTLES DRAWN AEROBIC AND ANAEROBIC Blood Culture adequate volume   Culture   Final    NO GROWTH < 12 HOURS Performed at [facility], [address] [place], [place] [address]    Report Status PENDING  Incomplete      Radiology Studies: CT ANGIO HEAD NECK W WO CM  Result Date: 04/14/2022 CLINICAL DATA:  66 year old female with double vision. Scattered punctate infarcts in the brainstem, cerebellum, left occipital lobe on MRI yesterday. EXAM: CT ANGIOGRAPHY HEAD AND NECK TECHNIQUE: Multidetector CT imaging of the head and neck was performed using the standard protocol during bolus administration of intravenous contrast. Multiplanar CT image reconstructions and MIPs were obtained to evaluate the vascular anatomy. Carotid stenosis measurements (when applicable) are obtained utilizing NASCET criteria, using the distal internal carotid diameter as the denominator. RADIATION DOSE REDUCTION: This exam was performed according to the departmental dose-optimization program which includes automated exposure control, adjustment of the mA and/or kV according to patient size and/or use of iterative reconstruction technique. CONTRAST:  5m OMNIPAQUE IOHEXOL 350 MG/ML SOLN COMPARISON:  Head CT and brain MRI yesterday.  FINDINGS: CT HEAD Brain: Multiple punctate ischemic foci detected in the brainstem, right cerebellum, and left occipital lobe remain occult by CT. No midline shift, ventriculomegaly, mass effect, evidence of mass lesion, intracranial hemorrhage or evidence of cortically based acute infarction. Calvarium and skull base: No acute osseous abnormality identified. Paranasal sinuses: Visualized paranasal sinuses and mastoids are stable and well aerated. Incidental left ethmoid osteoma (normal variant). Orbits: No acute orbit or scalp soft tissue finding. CTA NECK Skeleton: Prior sternotomy. Motion artifact in the cervical spine and at the mandible. No acute osseous abnormality identified. Upper chest: Bilateral upper lung mosaic attenuation. No superior mediastinal lymphadenopathy. Visible central pulmonary arteries are patent. Other neck: Bilateral salivary gland atrophy and extensive parotid sialolithiasis or dystrophic calcifications. Motion artifact in the neck. No neck mass or lymphadenopathy is evident. Aortic arch: Calcified aortic atherosclerosis. Prior CABG. Three vessel arch configuration. Tortuous arch. Right carotid system: No significant brachiocephalic artery plaque or stenosis. Proximal right CCA obscured by dense subclavian venous contrast streak. But the visible right CCA is patent before the bifurcation with tortuosity but no plaque. Partially retropharyngeal course. Retropharyngeal right carotid bifurcation. Mild calcified plaque at the right ICA bulb without stenosis. Motion artifact but no evidence of right ICA stenosis to the skull base. Additional tortuosity below the skull base. Left carotid system: Mild left CCA origin calcified plaque without stenosis. Partially retropharyngeal course. Mild calcified plaque at the left ICA origin without stenosis. Motion artifact and tortuosity but no stenosis identified. Vertebral arteries: Proximal right subclavian artery appears normal. Right vertebral artery  origin partially obscured by motion and streak artifact, not well evaluated. Right vertebral artery is mildly non dominant and tortuous. Motion artifact through the mid cervical spine. The right vertebral remains patent to the skull base, no obvious stenosis. Proximal left subclavian calcified plaque without stenosis. Normal left vertebral artery origin. Tortuous left V1 segment. Mildly dominant left vertebral artery with some of the V2 segment obscured by motion artifact but patent to the skull base with no obvious stenosis. CTA HEAD Posterior circulation: Vertebral V4 segments appear normal to the vertebrobasilar junction. The left is mildly dominant.  Both PICA origins are patent. Patent basilar artery without stenosis. Mild motion artifact at the basilar tip. Fetal type bilateral PCA origins. SCA origins are patent. Bilateral PCA branch motion artifact. No PCA occlusion or obvious stenosis. Anterior circulation: Both ICA siphons are patent. Mild motion artifact. Mild to moderate cavernous segment plaque bilaterally. Difficult to exclude moderate bilateral siphon stenosis but probably exaggerated from motion artifact. Both posterior communicating artery origins appear patent and normal. Patent carotid termini. Normal MCA and ACA origins. Normal anterior communicating artery. Bilateral ACA branches appear normal. Bilateral MCA M1 segments and bifurcations are patent without stenosis. Bilateral MCA branches are within normal limits. Venous sinuses: Early contrast timing, not well evaluated. Anatomic variants: Fetal type bilateral PCA origins. Mildly dominant left vertebral artery. Review of the MIP images confirms the above findings IMPRESSION: 1. Intermittently motion degraded exam. No large vessel occlusion, and generally mild for age atherosclerosis in the head and neck. No obvious hemodynamically significant arterial stenosis. 2. Note fetal type bilateral PCA origins, in conjunction with the pattern of punctate  infarcts involving the posterior fossa but also the left PCA territory, this may indicate a systemic embolic event from the heart or proximal aorta. 3. Punctate infarcts in #2 remain occult by CT. No new intracranial abnormality. 4. Prior CABG.  Aortic Atherosclerosis (ICD10-I70.0). 5. Generalized salivary gland atrophy with dystrophic calcifications or sialolithiasis. Electronically Signed   By: Genevie Ann M.Whitney.   On: 04/14/2022 06:03   DG Chest 2 View  Result Date: 04/13/2022 CLINICAL DATA:  Short of breath EXAM: CHEST - 2 VIEW COMPARISON:  12/17/2018 FINDINGS: Frontal and lateral views of the chest demonstrate postsurgical changes from median sternotomy and aortic valve replacement. The cardiac silhouette is enlarged. There is central vascular congestion and mild diffuse interstitial prominence, without airspace disease, effusion, or pneumothorax. Chronic elevation right hemidiaphragm. No acute bony abnormalities. IMPRESSION: 1. Findings consistent with congestive heart failure and mild interstitial edema. Electronically Signed   By: Randa Ngo M.Whitney.   On: 04/13/2022 19:40   MR BRAIN WO CONTRAST  Result Date: 04/13/2022 CLINICAL DATA:  Double vision, syncope, unsteady on her feet EXAM: MRI HEAD WITHOUT CONTRAST TECHNIQUE: Multiplanar, multiecho pulse sequences of the brain and surrounding structures were obtained without intravenous contrast. COMPARISON:  12/17/2018 MRI head, correlation is also made with 04/13/2022 CT head FINDINGS: Brain: Punctate foci of restricted diffusion with ADC correlates in the right cerebellum (series 9, images 60 and 69), midbrain (series 9, images 75-77), left occipital lobe (series 9, images 82 and 84), and right parietal lobe (series 9, image 93). No acute hemorrhage, mass, mass effect, or midline shift. Punctate focus of hemosiderin deposition in the medial left parietal lobe, possibly sequela of prior microhemorrhage or tiny embolus. No hydrocephalus or extra-axial  collection. Scattered T2 hyperintense signal in the periventricular white matter, likely the sequela of chronic small vessel ischemic disease. Partial empty sella. Normal craniocervical junction. Vascular: Normal arterial flow voids. Skull and upper cervical spine: Normal marrow signal. Sinuses/Orbits: Minimal mucosal thickening in the ethmoid air cells. The orbits are unremarkable. Other: The mastoids are well aerated. IMPRESSION: Punctate acute infarcts in the right cerebellum, midbrain, left occipital lobe, and right parietal lobe. Given multiple vascular territories, a central embolic etiology is suspected. These results were called by telephone at the time of interpretation on 04/13/2022 at 7:33 pm to provider MILLER, who verbally acknowledged these results. Electronically Signed   By: Merilyn Baba M.Whitney.   On: 04/13/2022 19:33   CT HEAD  WO CONTRAST  Result Date: 04/13/2022 CLINICAL DATA:  Double vision, unsteady gait, tingling in extremities EXAM: CT HEAD WITHOUT CONTRAST TECHNIQUE: Contiguous axial images were obtained from the base of the skull through the vertex without intravenous contrast. RADIATION DOSE REDUCTION: This exam was performed according to the departmental dose-optimization program which includes automated exposure control, adjustment of the mA and/or kV according to patient size and/or use of iterative reconstruction technique. COMPARISON:  12/17/2018 FINDINGS: Brain: No acute infarct or hemorrhage. Lateral ventricles and midline structures are unremarkable. No acute extra-axial fluid collections. No mass effect. Vascular: No hyperdense vessel or unexpected calcification. Skull: Normal. Negative for fracture or focal lesion. Sinuses/Orbits: No acute finding. Stable osteoma left ethmoid air cell. Other: None. IMPRESSION: 1. No acute intracranial process. Electronically Signed   By: Randa Ngo M.Whitney.   On: 04/13/2022 17:43       LOS: 1 day   [facility] Pager  on [email or web address]  04/14/2022, 10:02 AM

## 2022-04-14 NOTE — Progress Notes (Signed)
Pt requested to start cpap now instead of waiting for tonight. Pt currently placed on cpap with 3L O2 bled in and seems to be tolerating well at this time.

## 2022-04-14 NOTE — Progress Notes (Addendum)
The patient is admitted from ED to 3 W 09. A $ O 4. Denies any acute pain. She has already passed the bedside swallow evaluation but she's still NPO. Her temp is 101.9. Requested for a diet order change from Dr. Bridgett Larsson so she could get  Tylenol. She also wants something to eat. Dr. Bridgett Larsson declined to give a diet order. Stated he wants SL to evaluate her due to her active stroke. Will continue to monitor.

## 2022-04-14 NOTE — Plan of Care (Signed)
  Problem: Education: Goal: Knowledge of disease or condition will improve Outcome: Progressing   Problem: Coping: Goal: Will verbalize positive feelings about self Outcome: Progressing Goal: Will identify appropriate support needs Outcome: Progressing   Problem: Self-Care: Goal: Ability to participate in self-care as condition permits will improve Outcome: Progressing Goal: Verbalization of feelings and concerns over difficulty with self-care will improve Outcome: Progressing Goal: Ability to communicate needs accurately will improve Outcome: Progressing   Problem: Nutrition: Goal: Risk of aspiration will decrease Outcome: Progressing   Problem: Education: Goal: Knowledge of General Education information will improve Description: Including pain rating scale, medication(s)/side effects and non-pharmacologic comfort measures Outcome: Progressing   Problem: Nutrition: Goal: Risk of aspiration will decrease Outcome: Progressing

## 2022-04-14 NOTE — Progress Notes (Signed)
  Echocardiogram 2D Echocardiogram has been performed.  Whitney [name] 04/14/2022, 4:45 PM

## 2022-04-14 NOTE — Progress Notes (Signed)
The patient is requesting for CPAP. Dr. Bridgett Larsson notified for the order.

## 2022-04-14 NOTE — Evaluation (Signed)
Physical Therapy Evaluation Patient Details Name: Whitney [name] MRN: [id number] DOB: 09-06-1955 Today's Date: 04/14/2022  History of Present Illness  66 y.o. who presents with tingling in her extremities, unsteady gait and diplopia. MRI Brain demonstrated punctate acute infarcts in the right cerebellum, midbrain, left occipital lobe, and right parietal lobe. PMH significant for DM2, GERD, cirrhosis, Sjogrens syndrome, OSA.  Clinical Impression   Pt admitted secondary to problem above with deficits below. PTA patient was independent with mobility.  Pt currently requires minguard assist to ambulate with cane. She scored 51/56 on Berg Balance Assessment. She has significant loss of balance when feet are together and eyes closed and discussed safety related to turning on a light if she gets up at night (avoid walking in the dark as she needs her vision to help her balance). Patient will benefit from use of cane and agrees to use cane. Due to decr balance, using a new assistive device, and pt having a flight of steps to her basement, recommend HHPT on discharge. Pt in agreement.  Anticipate patient will benefit from PT to address problems listed below.Will continue to follow acutely to maximize functional mobility independence and safety.          Recommendations for follow up therapy are one component of a multi-disciplinary discharge planning process, led by the attending physician.  Recommendations may be updated based on patient status, additional functional criteria and insurance authorization.  Follow Up Recommendations Home health PT      Assistance Recommended at Discharge PRN  Patient can return home with the following  Help with stairs or ramp for entrance    Equipment Recommendations Cane  Recommendations for Other Services       Functional Status Assessment Patient has had a recent decline in their functional status and demonstrates the ability to make significant improvements in  function in a reasonable and predictable amount of time.     Precautions / Restrictions Precautions Precautions: Fall      Mobility  Bed Mobility Overal bed mobility: Independent                  Transfers Overall transfer level: Modified independent                 General transfer comment: with cane    Ambulation/Gait Ambulation/Gait assistance: Min guard Gait Distance (Feet): 60 Feet Assistive device: Straight cane Gait Pattern/deviations: Step-through pattern, Wide base of support, Drifts right/left       General Gait Details: only able to walk in room due to airborne precautions; pt with slight drift to her rt x 1 even with use of cane; pt instinctively coordinating appropriately with cane  Stairs Stairs:  (simulated with high marching with symmetrical lift of each leg and no UE support)          Wheelchair Mobility    Modified Rankin (Stroke Patients Only) Modified Rankin (Stroke Patients Only) Pre-Morbid Rankin Score: No symptoms Modified Rankin: Moderate disability     Balance Overall balance assessment: Mild deficits observed, not formally tested (hx of peripheral neuropathy)                               Standardized Balance Assessment Standardized Balance Assessment : Berg Balance Test Berg Balance Test Sit to Stand: Able to stand without using hands and stabilize independently Standing Unsupported: Able to stand safely 2 minutes Sitting with Back Unsupported but Feet Supported on  Floor or Stool: Able to sit safely and securely 2 minutes Stand to Sit: Sits safely with minimal use of hands Transfers: Able to transfer safely, minor use of hands Standing Unsupported with Eyes Closed: Able to stand 10 seconds safely Standing Ubsupported with Feet Together: Able to place feet together independently and stand 1 minute safely From Standing, Reach Forward with Outstretched Arm: Can reach confidently >25 cm (10") From Standing  Position, Pick up Object from Floor: Able to pick up shoe, needs supervision From Standing Position, Turn to Look Behind Over each Shoulder: Looks behind one side only/other side shows less weight shift Turn 360 Degrees: Able to turn 360 degrees safely in 4 seconds or less Standing Unsupported, Alternately Place Feet on Step/Stool: Able to stand independently and safely and complete 8 steps in 20 seconds Standing Unsupported, One Foot in Front: Able to plae foot ahead of the other independently and hold 30 seconds Standing on One Leg: Able to lift leg independently and hold equal to or more than 3 seconds Total Score: 51         Pertinent Vitals/Pain Pain Assessment Pain Assessment: No/denies pain    Home Living Family/patient expects to be discharged to:: Private residence Living Arrangements: Children (lives with her son who is currently in [place]; family can come check on her) Available Help at Discharge: Family;Available 24 hours/day Type of Home: House Home Access: Stairs to enter Entrance Stairs-Rails: None Entrance Stairs-Number of Steps: 3 steps; 50 ft; 3 steps   Home Layout: One level;Laundry or work area in basement (step into kitchen and livin room) Home Equipment: Grab bars - toilet;Grab bars - tub/shower;Shower seat      Prior Function Prior Level of Function : Driving;Independent/Modified Independent                     Hand Dominance   Dominant Hand: Right    Extremity/Trunk Assessment   Upper Extremity Assessment Upper Extremity Assessment: Defer to OT evaluation    Lower Extremity Assessment Lower Extremity Assessment: Overall WFL for tasks assessed    Cervical / Trunk Assessment Cervical / Trunk Assessment: Normal  Communication   Communication: No difficulties (per grand daughter had slurred speech; pt reports due to Sjogren's syndrome)  Cognition Arousal/Alertness: Awake/alert Behavior During Therapy: WFL for tasks  assessed/performed Overall Cognitive Status: Within Functional Limits for tasks assessed                                          General Comments      Exercises     Assessment/Plan    PT Assessment Patient needs continued PT services  PT Problem List Decreased balance;Decreased mobility;Decreased knowledge of use of DME;Impaired sensation;Obesity       PT Treatment Interventions DME instruction;Gait training;Stair training;Functional mobility training;Therapeutic activities;Balance training;Neuromuscular re-education;Patient/family education    PT Goals (Current goals can be found in the Care Plan section)  Acute Rehab PT Goals Patient Stated Goal: agrees to use cane for up/down steps at home PT Goal Formulation: With patient Time For Goal Achievement: 04/28/22 Potential to Achieve Goals: Good    Frequency Min 3X/week     Co-evaluation               AM-PAC PT "6 Clicks" Mobility  Outcome Measure Help needed turning from your back to your side while in a flat bed without using  bedrails?: None Help needed moving from lying on your back to sitting on the side of a flat bed without using bedrails?: None Help needed moving to and from a bed to a chair (including a wheelchair)?: None Help needed standing up from a chair using your arms (e.g., wheelchair or bedside chair)?: None Help needed to walk in hospital room?: A Little Help needed climbing 3-5 steps with a railing? : None 6 Click Score: 23    End of Session Equipment Utilized During Treatment: Gait belt Activity Tolerance: Patient tolerated treatment well Patient left: in bed;with call bell/phone within reach;with family/visitor present   PT Visit Diagnosis: Unsteadiness on feet (R26.81)    Time: 1250-1305 PT Time Calculation (min) (ACUTE ONLY): 15 min   Charges:   PT Evaluation $PT Eval Low Complexity: Newman, PT Acute Rehabilitation Services  Office  [phone]   Rexanne Mano 04/14/2022, 1:15 PM

## 2022-04-14 NOTE — Evaluation (Signed)
Occupational Therapy Evaluation Patient Details Name: Whitney [name] MRN: [id number] DOB: June 10, 1955 Today's Date: 04/14/2022   History of Present Illness 66 y.o. who presents with tingling in her extremities, unsteady gait and diplopia. MRI Brain demonstrated punctate acute infarcts in the right cerebellum, midbrain, left occipital lobe, and right parietal lobe. PMH significant for DM2, GERD, cirrhosis, Sjogrens syndrome, OSA.   Clinical Impression   PTA pt independent with mobility, ADL and IADL tasks. Her son, who lives with her, is currently hospitalized at [facility], however family can assist if needed at DC. Pt appears close to baseline without complaints of diplopia. Completed educated regarding warning signs/symptoms of CVA using [employer]. Family present for education. Not further OT needed. OT signing off.      Recommendations for follow up therapy are one component of a multi-disciplinary discharge planning process, led by the attending physician.  Recommendations may be updated based on patient status, additional functional criteria and insurance authorization.   Follow Up Recommendations  No OT follow up     Assistance Recommended at Discharge Intermittent Supervision/Assistance  Patient can return home with the following Direct supervision/assist for financial management;Direct supervision/assist for medications management;Assist for transportation    Functional Status Assessment  Patient has not had a recent decline in their functional status  Equipment Recommendations  None recommended by OT    Recommendations for Other Services PT consult     Precautions / Restrictions Precautions Precautions: Fall      Mobility Bed Mobility Overal bed mobility: Independent                  Transfers Overall transfer level: Modified independent                        Balance Overall balance assessment: Mild deficits observed, not formally tested (hx of peripheral  neuropathy)                                         ADL either performed or assessed with clinical judgement   ADL Overall ADL's : At baseline                                             Vision Baseline Vision/History: 1 Wears glasses (reading only ; was having double visoin imaged side by side however has resolved) Vision Assessment?: Yes Eye Alignment: Within Functional Limits Ocular Range of Motion: Within Functional Limits Alignment/Gaze Preference: Within Defined Limits Tracking/Visual Pursuits: Able to track stimulus in all quads without difficulty Saccades: Within functional limits Convergence: Within functional limits Visual Fields: No apparent deficits Diplopia Assessment: Other (comment) (was side by side; has now resolved)     Perception Perception Perception Tested?: Yes Comments: no deficits noted   Praxis Praxis Praxis tested?: Within functional limits    Pertinent Vitals/Pain Pain Assessment Pain Assessment: No/denies pain     Hand Dominance Right   Extremity/Trunk Assessment Upper Extremity Assessment Upper Extremity Assessment: Overall WFL for tasks assessed (residual "tingling" L fingertips)   Lower Extremity Assessment Lower Extremity Assessment: Defer to PT evaluation   Cervical / Trunk Assessment Cervical / Trunk Assessment: Normal   Communication Communication Communication: No difficulties (per grand daughter had slurred speech; pt reports due to Sjogren's syndrome)   Cognition  Arousal/Alertness: Awake/alert Behavior During Therapy: WFL for tasks assessed/performed Overall Cognitive Status: Within Functional Limits for tasks assessed                                 General Comments: Scored a 0 on the Short Blessed TEst of Concentration and ATtention, indicating cognition WNL; occasional word finding deficits noted, however she states this is baseline; able to give detailed history and  information regaridng management of IADL tasks     General Comments       Exercises     Shoulder Instructions      Home Living Family/patient expects to be discharged to:: Private residence Living Arrangements: Children (lives with her son who is currently in [place]; family can come check on her) Available Help at Discharge: Family;Available 24 hours/day Type of Home: House Home Access: Stairs to enter [employer] of Steps: 3 steps; 50 ft; 3 steps Entrance Stairs-Rails: None Home Layout: One level;Laundry or work area in basement (step into kitchen and livin room)     Bathroom Shower/Tub: Tub/shower unit;Door   [employer] Toilet: Standard Bathroom Accessibility: Yes How Accessible: Accessible via walker Home Equipment: Grab bars - toilet;Grab bars - tub/shower;Shower seat          Prior Functioning/Environment Prior Level of Function : Driving;Independent/Modified Independent                        OT Problem List:        OT Treatment/Interventions:      OT Goals(Current goals can be found in the care plan section) Acute Rehab OT Goals Patient Stated Goal: to go home OT Goal Formulation: All assessment and education complete, DC therapy  OT Frequency:      Co-evaluation              AM-PAC OT "6 Clicks" Daily Activity     Outcome Measure Help from another person eating meals?: None Help from another person taking care of personal grooming?: None Help from another person toileting, which includes using toliet, bedpan, or urinal?: None Help from another person bathing (including washing, rinsing, drying)?: None Help from another person to put on and taking off regular upper body clothing?: None Help from another person to put on and taking off regular lower body clothing?: None 6 Click Score: 24   End of Session Equipment Utilized During Treatment: Gait belt Nurse Communication: Mobility status  Activity Tolerance: Patient  tolerated treatment well Patient left: in bed;with call bell/phone within reach;with family/visitor present                   Time: 3009-2330 OT Time Calculation (min): 37 min Charges:  OT General Charges $OT Visit: 1 Visit OT Evaluation $OT Eval Moderate Complexity: 1 Mod OT Treatments $Self Care/Home Management : 8-22 mins  Maurie Boettcher, OT/L   Acute OT Clinical Specialist Acute Rehabilitation Services Pager [phone] Office [phone]   [facility] 04/14/2022, 11:41 AM

## 2022-04-14 NOTE — Evaluation (Signed)
Clinical/Bedside Swallow Evaluation Patient Details  Name: Whitney [name] MRN: [id number] Date of Birth: [birth year]  Today's Date: 04/14/2022 Time: SLP Start Time (ACUTE ONLY): 0915 SLP Stop Time (ACUTE ONLY): 0927 SLP Time Calculation (min) (ACUTE ONLY): 12 min  Past Medical History:  Past Medical History:  Diagnosis Date   Allergic rhinitis    Anemia    hx   Arthritis    Asthma    hx yrs ago   Cirrhosis ([place]) last albumin 3.3 done at [place] 06-16-2014 (under care everywhere tab in epic)   Secondary to Fatty liver --  followed by hepatology at [place] (dr Gerald Dexter)    Depression    Diabetes mellitus type II    type 2 diet conrolled   Dyspnea    Fibromyalgia    GERD (gastroesophageal reflux disease)    Heart murmur    asymptomatic ---  1989 from rhuematic fever   History of exercise stress test    05-05-2013---  negative bruce ETT given exercise workload,  no ischemia   History of hiatal hernia    History of kidney stones    History of rheumatic fever    1989   Hyperlipidemia    Leukocytopenia    Moderate aortic stenosis    AVA area 1.1cm2---  cardiologist --  dr Concepcion Living, 2014 in epic   NASH (nonalcoholic steatohepatitis)    OSA (obstructive sleep apnea)    was using CPAP before gastric sleeve 2015--  no uses after wt loss   Pneumonia    hx   Sjogren's syndrome (HCC)    Thrombocytopenia (HCC)    Past Surgical History:  Past Surgical History:  Procedure Laterality Date   AORTIC VALVE REPLACEMENT N/A 12/05/2017   Procedure: AORTIC VALVE REPLACEMENT (AVR) TISSUE VALVE 21MM INSPIRIS;  Surgeon: Gaye Pollack, MD;  Location: [place];  Service: Open Heart Surgery;  Laterality: N/A;   COLONOSCOPY WITH ESOPHAGOGASTRODUODENOSCOPY (EGD)     CYSTOSCOPY WITH RETROGRADE PYELOGRAM, URETEROSCOPY AND STENT PLACEMENT Left 01/21/2015   Procedure: CYSTOSCOPY WITH LEFT  RETROGRADE PYELOGRAM, LEFT URETEROSCOPY AND STENT PLACEMENT;  Surgeon: Festus Aloe, MD;  Location: [facility];  Service: Urology;  Laterality: Left;   CYSTOSCOPY WITH RETROGRADE PYELOGRAM, URETEROSCOPY AND STENT PLACEMENT Bilateral 02/24/2015   Procedure: CYSTOSCOPY WITH RIGHT RETROGRADE PYELOGRAM, BLADDER BIOPSY FULGERATION LEFT URETEROSCOPY AND STENT REPLACEMENT;  Surgeon: Festus Aloe, MD;  Location: [facility];  Service: Urology;  Laterality: Bilateral;   EXPLORATORY LAPAROSCOPY W/  CONE BIOPSY'S LEFT AND RIGHT LOBE OF LIVER  11-04-2007   HOLMIUM LASER APPLICATION Left 75/17/0017   Procedure: HOLMIUM LASER LITHOTRIPSY;  Surgeon: Festus Aloe, MD;  Location: [facility];  Service: Urology;  Laterality: Left;   HYSTEROSCOPY WITH D & C N/A 12/11/2012   Procedure: DILATATION AND CURETTAGE /HYSTEROSCOPY;  Surgeon: Maeola Sarah. Landry Mellow, MD;  Location: [facility];  Service: Gynecology;  Laterality: N/A;   INGUINAL HERNIA REPAIR Left 10/22/2016   Procedure: LEFT INGUINAL HERNIA REPAIR;  Surgeon: Rolm Bookbinder, MD;  Location: [place];  Service: General;  Laterality: Left;  TAP BLOCK   INSERTION OF MESH Left 10/22/2016   Procedure: INSERTION OF MESH;  Surgeon: Rolm Bookbinder, MD;  Location: [place];  Service: General;  Laterality: Left;   LAPAROSCOPIC GASTRIC SLEEVE RESECTION  07-27-2013   PUBOVAGINAL SLING  04-10-2001   [place]   RIGHT/LEFT HEART CATH AND CORONARY ANGIOGRAPHY N/A 08/23/2017   Procedure: RIGHT/LEFT HEART CATH AND CORONARY ANGIOGRAPHY;  Surgeon: Irish Lack,  Charlann Lange, MD;  Location: [facility];  Service: Cardiovascular;  Laterality: N/A;   TEE WITHOUT CARDIOVERSION N/A 12/05/2017   Procedure: TRANSESOPHAGEAL ECHOCARDIOGRAM (TEE);  Surgeon: Gaye Pollack, MD;  Location: [place];  Service: Open Heart Surgery;  Laterality: N/A;   TONSILLECTOMY  1975   TRANSTHORACIC ECHOCARDIOGRAM  06-04-2012  dr Irish Lack   mild LVH,  grade I diastolic dysfunction/  ef 60-65%/  moderate LAE/  mild MV calcifation without stenosis,  mild MR/  moderate AV stenosis,  cannot r/o  bicupsid, area 1.1cm2/  mild dilated aortic root/  trivial TR   HPI:  Whitney [name] is a 66 y.o. female with medical history significant of Anemia, Asthma, Cirrhosis,DMII, GERD, Depression, fibromyalgia, hx of heart murmur( due to rheumatic fever 1989)  s/p aortic valve replacement 2019, Sjogren's syndrome, hx of OSA on cpap.  She presented to ED with complaint of double vision, unsteady gate, feeling of disequilibrium as well as paresthesias in b/l fingers and mouth.  Patient also noted to have increased fatigue and somnolence.  She notes chronic fibromyalgia pain but noted no cardiac type pain. She notes SHOB related to history of asthma but noted it was increased from baseline this am. Currently does not feel short of breath. She also noted nausea w/o emesis, as well as right flank pain.   MRI was showing punctate acute infarcts in the right cerebellum, midbrain, left occipital lobe and right parietal lobe concerning for embolic etiology.    Assessment / Plan / Recommendation  Clinical Impression  Clinical swallowing evaluation was completed using thin liquids via spoon, cup and straw, pureed material and dry solids.  RN approved patient for PO intake.  The patient did not endorse issues swallowing except for a dry mouth secondary to her known Sjrogrens diagnosis.  Cranial nerve exam was completed and unremarkable. Lingual, labial, facial and jaw range of motion and strength appeared to be adequate.  Facial sensation appeared to be intact and she did not endorse a difference in sensation between the right and left side of her face.  Her oral and pharyngeal swallow appeared to be functional.  Mastication of dry solids was mildly slow with good oral clearance.  Swallow trigger was appreciated to palpation and overt s/s of aspiration were not seen despite serial sips of thin liquids via straw.  Suggest beginning a regular textured diet with full range of liquids.  ST follow up is not indicated for  swallowing.  Cognitive/linguistic evaluation is pending. SLP Visit Diagnosis: Dysphagia, unspecified (R13.10)    Aspiration Risk  No limitations    Diet Recommendation   Regular with thin liquids  Medication Administration: Whole meds with liquid    Other  Recommendations Oral Care Recommendations: Oral care BID    Recommendations for follow up therapy are one component of a multi-disciplinary discharge planning process, led by the attending physician.  Recommendations may be updated based on patient status, additional functional criteria and insurance authorization.  Follow up Recommendations No SLP follow up        [place] Date of Onset: 04/13/22 HPI: Whitney [name] is a 66 y.o. female with medical history significant of Anemia, Asthma, Cirrhosis,DMII, GERD, Depression, fibromyalgia, hx of heart murmur( due to rheumatic fever 1989)  s/p aortic valve replacement 2019, Sjogren's syndrome, hx of OSA on cpap.  She presented to ED with complaint of double vision, unsteady gate, feeling of disequilibrium as well as paresthesias in b/l fingers and mouth.  Patient also noted to have increased fatigue and somnolence.  She notes chronic fibromyalgia pain but noted no cardiac type pain. She notes SHOB related to history of asthma but noted it was increased from baseline this am. Currently does not feel short of breath. She also noted nausea w/o emesis, as well as right flank pain.   MRI was showing punctate acute infarcts in the right cerebellum, midbrain, left occipital lobe and right parietal lobe concerning for embolic etiology. Type of Study: Bedside Swallow Evaluation Previous Swallow Assessment: None noted at [facility] Diet Prior to this Study: NPO Temperature Spikes Noted: Yes Respiratory Status: Room air History of Recent Intubation: No Behavior/Cognition: Alert;Cooperative;Pleasant mood Oral Cavity Assessment: Dry;Within Functional Limits Oral Care Completed by SLP: No Oral  Cavity - Dentition: Adequate natural dentition Vision: Functional for self-feeding Self-Feeding Abilities: Able to feed self Patient Positioning: Upright in bed Baseline Vocal Quality: Normal Volitional Swallow: Able to elicit    Oral/Motor/Sensory Function Overall Oral Motor/Sensory Function: Within functional limits   Ice Chips Ice chips: Not tested   Thin Liquid Thin Liquid: Within functional limits Presentation: Cup;Self Fed;Spoon;Straw    Nectar Thick Nectar Thick Liquid: Not tested   Honey Thick Honey Thick Liquid: Not tested   Puree Puree: Within functional limits Presentation: Self Fed;Spoon   Solid     Solid: Within functional limits Presentation: [place], [place], [place] Acute Rehab SLP [phone]  Lamar Sprinkles 04/14/2022,9:37 AM

## 2022-04-15 DIAGNOSIS — I639 Cerebral infarction, unspecified: Secondary | ICD-10-CM | POA: Diagnosis not present

## 2022-04-15 DIAGNOSIS — R509 Fever, unspecified: Secondary | ICD-10-CM | POA: Diagnosis not present

## 2022-04-15 LAB — CBC
HCT: 37.8 % (ref 36.0–46.0)
Hemoglobin: 12.4 g/dL (ref 12.0–15.0)
MCH: 30.7 pg (ref 26.0–34.0)
MCHC: 32.8 g/dL (ref 30.0–36.0)
MCV: 93.6 fL (ref 80.0–100.0)
Platelets: 79 10*3/uL — ABNORMAL LOW (ref 150–400)
RBC: 4.04 MIL/uL (ref 3.87–5.11)
RDW: 13.9 % (ref 11.5–15.5)
WBC: 3 10*3/uL — ABNORMAL LOW (ref 4.0–10.5)
nRBC: 0 % (ref 0.0–0.2)

## 2022-04-15 LAB — COMPREHENSIVE METABOLIC PANEL
ALT: 11 U/L (ref 0–44)
AST: 25 U/L (ref 15–41)
Albumin: 2.1 g/dL — ABNORMAL LOW (ref 3.5–5.0)
Alkaline Phosphatase: 52 U/L (ref 38–126)
Anion gap: 7 (ref 5–15)
BUN: 15 mg/dL (ref 8–23)
CO2: 25 mmol/L (ref 22–32)
Calcium: 8 mg/dL — ABNORMAL LOW (ref 8.9–10.3)
Chloride: 104 mmol/L (ref 98–111)
Creatinine, Ser: 0.71 mg/dL (ref 0.44–1.00)
GFR, Estimated: >60 mL/min (ref >60)
Glucose, Bld: 110 mg/dL — ABNORMAL HIGH (ref 70–99)
Potassium: 3.7 mmol/L (ref 3.5–5.1)
Sodium: 136 mmol/L (ref 135–145)
Total Bilirubin: 1.1 mg/dL (ref 0.3–1.2)
Total Protein: 5.3 g/dL — ABNORMAL LOW (ref 6.5–8.1)

## 2022-04-15 LAB — CULTURE, OB URINE

## 2022-04-15 LAB — PROCALCITONIN: Procalcitonin: <0.1 ng/mL

## 2022-04-15 LAB — ECHOCARDIOGRAM COMPLETE BUBBLE STUDY
AR max vel: 1.73 cm2
AV Area VTI: 1.91 cm2
AV Area mean vel: 1.74 cm2
AV Mean grad: 21 mmHg
AV Peak grad: 37.9 mmHg
Ao pk vel: 3.08 m/s
Area-P 1/2: 3.08 cm2
MV VTI: 2.79 cm2
S' Lateral: 3.3 cm

## 2022-04-15 MED ORDER — CLOPIDOGREL BISULFATE 75 MG PO TABS
75.0000 mg | ORAL_TABLET | Freq: Every day | ORAL | Status: DC
  Administered 2022-04-15 – 2022-04-16 (×2): 75 mg via ORAL
  Filled 2022-04-15 (×2): qty 1

## 2022-04-15 NOTE — Progress Notes (Signed)
Physical Therapy Treatment and Discharge Patient Details Name: Whitney [name] MRN: [id number] DOB: [birth year] Today's Date: 04/15/2022   History of Present Illness 66 y.o. who presents with tingling in her extremities, unsteady gait and diplopia. MRI Brain demonstrated punctate acute infarcts in the right cerebellum, midbrain, left occipital lobe, and right parietal lobe. PMH significant for DM2, GERD, cirrhosis, Sjogrens syndrome, OSA.    PT Comments    Patient now off airborne/contact precautions and able to ambulate in hall and complete stair training with cane. Patient able to ascend and descend with use of cane only (no rails) to simulate her outside steps at home. Noted cane has been ordered and due to be delivered to her room. All acute PT goals met and signing off.     Recommendations for follow up therapy are one component of a multi-disciplinary discharge planning process, led by the attending physician.  Recommendations may be updated based on patient status, additional functional criteria and insurance authorization.  Follow Up Recommendations  Home health PT     Assistance Recommended at Discharge PRN  Patient can return home with the following Help with stairs or ramp for entrance   Equipment Recommendations  Cane    Recommendations for Other Services       Precautions / Restrictions Precautions Precautions: Fall Restrictions Weight Bearing Restrictions: No     Mobility  Bed Mobility Overal bed mobility: Independent                  Transfers Overall transfer level: Modified independent                 General transfer comment: with cane    Ambulation/Gait Ambulation/Gait assistance: Modified independent (Device/Increase time) Gait Distance (Feet): 200 Feet Assistive device: Straight cane Gait Pattern/deviations: Step-through pattern, Wide base of support, Drifts right/left   Gait velocity interpretation: >2.62 ft/sec, indicative of  community ambulatory   General Gait Details: appropriate sequencing with cane in rt hand   Stairs Stairs: Yes Stairs assistance: Modified independent (Device/Increase time) Stair Management: No rails, With cane, Step to pattern, Forwards Number of Stairs: 2 General stair comments: appropriate sequencing with cane; no imbalance even with head turns, turning 180 degrees   Wheelchair Mobility    Modified Rankin (Stroke Patients Only) Modified Rankin (Stroke Patients Only) Pre-Morbid Rankin Score: No symptoms Modified Rankin: Moderate disability     Balance                                            Cognition Arousal/Alertness: Awake/alert Behavior During Therapy: WFL for tasks assessed/performed Overall Cognitive Status: Within Functional Limits for tasks assessed                                          Exercises      General Comments General comments (skin integrity, edema, etc.): Now off airborned precautions      Pertinent Vitals/Pain Pain Assessment Pain Assessment: No/denies pain    Home Living                          Prior Function            PT Goals (current goals can now be found in the care plan  section) Acute Rehab PT Goals Patient Stated Goal: agrees to use cane for up/down steps at home PT Goal Formulation: With patient Time For Goal Achievement: 04/28/22 Potential to Achieve Goals: Good Progress towards PT goals: Goals met/education completed, patient discharged from PT    Frequency    Min 3X/week      PT Plan Current plan remains appropriate    Co-evaluation              AM-PAC PT "6 Clicks" Mobility   Outcome Measure  Help needed turning from your back to your side while in a flat bed without using bedrails?: None Help needed moving from lying on your back to sitting on the side of a flat bed without using bedrails?: None Help needed moving to and from a bed to a chair  (including a wheelchair)?: None Help needed standing up from a chair using your arms (e.g., wheelchair or bedside chair)?: None Help needed to walk in hospital room?: None Help needed climbing 3-5 steps with a railing? : None 6 Click Score: 24    End of Session Equipment Utilized During Treatment: Gait belt Activity Tolerance: Patient tolerated treatment well Patient left: in bed;with call bell/phone within reach (pt refused up to recliner due to uncomfortable chair)   PT Visit Diagnosis: Unsteadiness on feet (R26.81)     Time: 1021-1030 PT Time Calculation (min) (ACUTE ONLY): 9 min  Charges:  $Gait Training: 8-22 mins                      [place]  Office [phone]    Rexanne Mano 04/15/2022, 10:40 AM

## 2022-04-15 NOTE — Evaluation (Signed)
Speech Language Pathology Evaluation Patient Details Name: Whitney [name] MRN: [id number] DOB: [birth year] Today's Date: 04/15/2022 Time: 1856-3149 SLP Time Calculation (min) (ACUTE ONLY): 14 min  Problem List:  Patient Active Problem List   Diagnosis Date Noted   Acute CVA (cerebrovascular accident) ([place]) 04/13/2022   COPD (chronic obstructive pulmonary disease) ([place]) 01/18/2021   Dyspnea 01/06/2019   Paroxysmal atrial fibrillation ([place]) 01/24/2018   S/P AVR 12/05/2017   Severe aortic stenosis    Inguinal hernia 10/22/2016   Lower extremity edema 06/22/2015   Aortic valve disorder 04/21/2013   Edema 04/21/2013   Depression    OSA (obstructive sleep apnea)    Sjogren's syndrome ([place])    Cirrhosis ([place])    Hyperlipidemia    Fibromyalgia    Postmenopausal bleeding 12/11/2012   Endometrial polyp 12/11/2012   Chronic cough 04/11/2011   Past Medical History:  Past Medical History:  Diagnosis Date   Allergic rhinitis    Anemia    hx   Arthritis    Asthma    hx yrs ago   Cirrhosis ([place]) last albumin 3.3 done at [facility] 06-16-2014 (under care everywhere tab in epic)   Secondary to Fatty liver --  followed by hepatology at [facility] (dr Gerald Dexter)    Depression    Diabetes mellitus type II    type 2 diet conrolled   Dyspnea    Fibromyalgia    GERD (gastroesophageal reflux disease)    Heart murmur    asymptomatic ---  1989 from rhuematic fever   History of exercise stress test    05-05-2013---  negative bruce ETT given exercise workload,  no ischemia   History of hiatal hernia    History of kidney stones    History of rheumatic fever    1989   Hyperlipidemia    Leukocytopenia    Moderate aortic stenosis    AVA area 1.1cm2---  cardiologist --  dr Concepcion Living, 2014 in epic   NASH (nonalcoholic steatohepatitis)    OSA (obstructive sleep apnea)    was using CPAP before gastric sleeve 2015--  no uses after wt loss   Pneumonia    hx   Sjogren's syndrome ([facility])    Thrombocytopenia  ([facility])    Past Surgical History:  Past Surgical History:  Procedure Laterality Date   AORTIC VALVE REPLACEMENT N/A 12/05/2017   Procedure: AORTIC VALVE REPLACEMENT (AVR) TISSUE VALVE 21MM INSPIRIS;  Surgeon: Gaye Pollack, MD;  Location: [facility];  Service: Open Heart Surgery;  Laterality: N/A;   COLONOSCOPY WITH ESOPHAGOGASTRODUODENOSCOPY (EGD)     CYSTOSCOPY WITH RETROGRADE PYELOGRAM, URETEROSCOPY AND STENT PLACEMENT Left 01/21/2015   Procedure: CYSTOSCOPY WITH LEFT  RETROGRADE PYELOGRAM, LEFT URETEROSCOPY AND STENT PLACEMENT;  Surgeon: Festus Aloe, MD;  Location: [facility];  Service: Urology;  Laterality: Left;   CYSTOSCOPY WITH RETROGRADE PYELOGRAM, URETEROSCOPY AND STENT PLACEMENT Bilateral 02/24/2015   Procedure: CYSTOSCOPY WITH RIGHT RETROGRADE PYELOGRAM, BLADDER BIOPSY FULGERATION LEFT URETEROSCOPY AND STENT REPLACEMENT;  Surgeon: Festus Aloe, MD;  Location[facility];  Service: Urology;  Laterality: Bilateral;   EXPLORATORY LAPAROSCOPY W/  CONE BIOPSY'S LEFT AND RIGHT LOBE OF LIVER  11-04-2007   HOLMIUM LASER APPLICATION Left 70/26/3785   Procedure: HOLMIUM LASER LITHOTRIPSY;  Surgeon: Festus Aloe, MD;  Location[facility];  Service: Urology;  Laterality: Left;   HYSTEROSCOPY WITH D & C N/A 12/11/2012   Procedure: DILATATION AND CURETTAGE /HYSTEROSCOPY;  Surgeon: Maeola Sarah. Landry Mellow, MD;  Location: [facility];  Service: Gynecology;  Laterality: N/A;   INGUINAL HERNIA REPAIR Left 10/22/2016   Procedure: LEFT INGUINAL HERNIA REPAIR;  Surgeon: Rolm Bookbinder, MD;  Location: [facility];  Service: General;  Laterality: Left;  TAP BLOCK   INSERTION OF MESH Left 10/22/2016   Procedure: INSERTION OF MESH;  Surgeon: Rolm Bookbinder, MD;  Location: [facility];  Service: General;  Laterality: Left;   LAPAROSCOPIC GASTRIC SLEEVE RESECTION  07-27-2013   PUBOVAGINAL SLING  04-10-2001   [place]   RIGHT/LEFT HEART CATH AND CORONARY ANGIOGRAPHY N/A 08/23/2017    Procedure: RIGHT/LEFT HEART CATH AND CORONARY ANGIOGRAPHY;  Surgeon: [name], MD;  Location: [facility];  Service: Cardiovascular;  Laterality: N/A;   TEE WITHOUT CARDIOVERSION N/A 12/05/2017   Procedure: TRANSESOPHAGEAL ECHOCARDIOGRAM (TEE);  Surgeon: Gaye Pollack, MD;  Location: [facility];  Service: Open Heart Surgery;  Laterality: N/A;   TONSILLECTOMY  1975   TRANSTHORACIC ECHOCARDIOGRAM  06-04-2012  dr Irish Lack   mild LVH,  grade I diastolic dysfunction/  ef 60-65%/  moderate LAE/  mild MV calcifation without stenosis,  mild MR/  moderate AV stenosis,  cannot r/o bicupsid, area 1.1cm2/  mild dilated aortic root/  trivial TR   HPI:  66 y.o. who presents with tingling in her extremities, unsteady gait and diplopia. MRI Brain demonstrated punctate acute infarcts in the right cerebellum, midbrain, left occipital lobe, and right parietal lobe. PMH significant for DM2, GERD, cirrhosis, Sjogrens syndrome, OSA.   Assessment / Plan / Recommendation Clinical Impression  Whitney [name] presents with normal expressive/receptive language - expression is fluent and speech is clear without dysarthria. Follows three step commands.  Demonstrates normal attention, working memory, Engineer, technical sales, and awareness. Oral reading WNL. No deficits identified. Reviewed BE FAST acronym. No SLP f/u is needed. Whitney [name] agrees.    SLP Assessment  SLP Recommendation/Assessment: Patient does not need any further Speech [place] Pathology Services SLP Visit Diagnosis: Cognitive communication deficit (R41.841)    Recommendations for follow up therapy are one component of a multi-disciplinary discharge planning process, led by the attending physician.  Recommendations may be updated based on patient status, additional functional criteria and insurance authorization.    Follow Up Recommendations  No SLP follow up    Assistance Recommended at Discharge  None                 SLP Evaluation Cognition   Overall Cognitive Status: Within Functional Limits for tasks assessed Arousal/Alertness: Awake/alert Orientation Level: Oriented X4 Attention: Selective Selective Attention: Appears intact Memory: Appears intact Awareness: Appears intact Problem Solving: Appears intact Safety/Judgment: Appears intact       Comprehension  Auditory Comprehension Overall Auditory Comprehension: Appears within functional limits for tasks assessed Commands: Within Functional Limits Conversation: Complex Reading Comprehension Reading Status: Within funtional limits    Expression Expression Primary Mode of Expression: Verbal Verbal Expression Overall Verbal Expression: Appears within functional limits for tasks assessed Initiation: No impairment Level of Generative/Spontaneous Verbalization: Conversation Repetition: No impairment Naming: No impairment Pragmatics: No impairment Written Expression Dominant Hand: Right   Oral / Motor  Oral Motor/Sensory Function Overall Oral Motor/Sensory Function: Within functional limits Motor Speech Overall Motor Speech: Appears within functional limits for tasks assessed            Juan Quam Laurice 04/15/2022, 2:32 PM Joao Mccurdy L. Tivis Ringer, MA CCC/SLP Clinical Specialist - [place] Office number [phone]

## 2022-04-15 NOTE — Progress Notes (Signed)
STROKE TEAM PROGRESS NOTE   SUBJECTIVE (INTERVAL HISTORY) Daughter at the bedside. Pt lying in bed, awake alert, no neuro change. Blood culture so far negative. Pt no fever or leukocytosis. Plan for loop recorder prior to discharge.    OBJECTIVE Temp:  [98.1 F (36.7 C)-100.1 F (37.8 C)] 98.3 F (36.8 C) (12/10 1156) Pulse Rate:  [81-95] 82 (12/10 1156) Cardiac Rhythm: Normal sinus rhythm (12/10 0700) Resp:  [18-20] 18 (12/10 1156) BP: (108-122)/(62-79) 121/79 (12/10 1156) SpO2:  [92 %-95 %] 95 % (12/10 1156)  Recent Labs  Lab 04/14/22 0041  GLUCAP 91   Recent Labs  Lab 04/13/22 1707 04/13/22 1730 04/13/22 1731 04/13/22 2235 04/15/22 0248  NA 139 141 141 138 136  K 3.9 3.7 3.7 4.4 3.7  CL 104 102  --  105 104  CO2 28  --   --  24 25  GLUCOSE 122* 116*  --  83 110*  BUN 14 14  --  15 15  CREATININE 0.88 0.80  --  0.81 0.71  CALCIUM 8.2*  --   --  8.0* 8.0*  MG 1.8  --   --   --   --    Recent Labs  Lab 04/13/22 1707 04/13/22 2235 04/15/22 0248  AST 31 40 25  ALT _0 ALKPHOS 55 54 52  BILITOT 0.9 1.1 1.1  PROT 5.6* 5.9* 5.3*  ALBUMIN 2.4* 2.5* 2.1*   Recent Labs  Lab 04/13/22 1707 04/13/22 1730 04/13/22 1731 04/13/22 2235 04/15/22 0248  WBC 2.3*  --   --  3.0* 3.0*  NEUTROABS 1.1*  --   --   --   --   HGB 13.1 12.9 13.3 13.4 12.4  HCT 40.1 38.0 39.0 41.3 37.8  MCV 94.4  --   --  94.1 93.6  PLT 76*  --   --  77* 79*   No results for input(s): "CKTOTAL", "CKMB", "CKMBINDEX", "TROPONINI" in the last 168 hours. Recent Labs    04/13/22 1707  LABPROT 15.6*  INR 1.3*   Recent Labs    04/13/22 1651  COLORURINE YELLOW  LABSPEC 1.024  PHURINE 5.0  GLUCOSEU NEGATIVE  HGBUR SMALL*  BILIRUBINUR NEGATIVE  KETONESUR NEGATIVE  PROTEINUR 100*  NITRITE NEGATIVE  LEUKOCYTESUR NEGATIVE       Component Value Date/Time   CHOL 140 04/14/2022 0242   TRIG 70 04/14/2022 0242   HDL 29 (L) 04/14/2022 0242   CHOLHDL 4.8 04/14/2022 0242   VLDL 14  04/14/2022 0242   LDLCALC 97 04/14/2022 0242   Lab Results  Component Value Date   HGBA1C 5.5 12/03/2017      Component Value Date/Time   LABOPIA NONE DETECTED 04/13/2022 1651   COCAINSCRNUR NONE DETECTED 04/13/2022 1651   LABBENZ NONE DETECTED 04/13/2022 1651   AMPHETMU NONE DETECTED 04/13/2022 1651   THCU NONE DETECTED 04/13/2022 1651   LABBARB NONE DETECTED 04/13/2022 1651    Recent Labs  Lab 04/13/22 1707 04/13/22 2235  ETH <10 <10    I have personally reviewed the radiological images below and agree with the radiology interpretations.  ECHOCARDIOGRAM COMPLETE BUBBLE STUDY  Result Date: 04/15/2022    ECHOCARDIOGRAM REPORT   Patient Name:   Whitney [name] Date of Exam: 04/14/2022 Medical Rec #:  [id number]      Height:       64.0 in Accession #:    [id number]     Weight:       216.0 lb Date  of Birth:  1955-12-25       BSA:          2.022 m Patient Age:    66 years       BP:           125/73 mmHg Patient Gender: F              HR:           96 bpm. Exam Location:  Inpatient Procedure: 2D Echo, Cardiac Doppler and Color Doppler Indications:    Stroke  History:        Patient has prior history of Echocardiogram examinations, most                 recent 12/26/2018. Aortic Valve Disease and Mitral Valve Disease.                 MAC. 21 mm Inspiris bovine pericardial valve in the aortic                 position. Procedure date 12/05/17.  Sonographer:    Clayton Lefort RDCS (AE) Referring Phys: [id number] Morgan  1. Left ventricular ejection fraction, by estimation, is 60 to 65%. The left ventricle has normal function. The left ventricle has no regional wall motion abnormalities. There is mild concentric left ventricular hypertrophy. Left ventricular diastolic parameters are consistent with Grade II diastolic dysfunction (pseudonormalization). Elevated left ventricular end-diastolic pressure.  2. Right ventricular systolic function is normal. The right ventricular size is normal.  There is moderately elevated pulmonary artery systolic pressure.  3. Left atrial size was severely dilated.  4. Right atrial size was severely dilated.  5. Aortic dilatation noted. There is mild dilatation of the ascending aorta, measuring 41 mm.  6. The mitral valve is degenerative. Mild mitral valve regurgitation. Mild mitral stenosis. The mean mitral valve gradient is 4.0 mmHg. There is heavy calcification of the mitral annulus extending into the LV cavity and involving the submitral apparatus. There is caseous mitral annular calcification extending into the basal lateral LV wall.  7. The aortic valve has been repaired/replaced. There is a 21 mm Inspiris bovine pericardial valve present in the aortic position.     Perivalvular Aortic valve regurgitation is trivial. There is moderate calcification and thickening of the AV leaflets. Mild aortic stenosis is present. Aortic valve mean gradient measures 21.0 mmHg. Aortic valve peak gradient measures 37.9 mmHg. Aortic valve area, by VTI measures 1.91 cm.  8. The inferior vena cava is normal in size with greater than 50% respiratory variability, suggesting right atrial pressure of 3 mmHg.  9. Compared to study dated 12/26/2018, the is no significant change in mean AVG or DVI. FINDINGS  Left Ventricle: Left ventricular ejection fraction, by estimation, is 60 to 65%. The left ventricle has normal function. The left ventricle has no regional wall motion abnormalities. The left ventricular internal cavity size was normal in size. There is  mild concentric left ventricular hypertrophy. Left ventricular diastolic parameters are consistent with Grade II diastolic dysfunction (pseudonormalization). Elevated left ventricular end-diastolic pressure. Right Ventricle: The right ventricular size is normal. No increase in right ventricular wall thickness. Right ventricular systolic function is normal. There is moderately elevated pulmonary artery systolic pressure. The tricuspid  regurgitant velocity is 3.40 m/s, and with an assumed right atrial pressure of 8 mmHg, the estimated right ventricular systolic pressure is 70.3 mmHg. Left Atrium: Left atrial size was severely dilated. Right Atrium: Right atrial size was  severely dilated. Pericardium: There is no evidence of pericardial effusion. Mitral Valve: There is caseous mitral annular calcification extending into the basal lateral LV wall. The mitral valve is degenerative in appearance. There is severe thickening of the mitral valve leaflet(s). There is severe calcification of the mitral valve leaflet(s). Severe mitral annular calcification. Mild mitral valve regurgitation. Mild mitral valve stenosis. MV peak gradient, 6.5 mmHg. The mean mitral valve gradient is 4.0 mmHg. Tricuspid Valve: The tricuspid valve is normal in structure. Tricuspid valve regurgitation is mild . No evidence of tricuspid stenosis. Aortic Valve: The aortic valve has been repaired/replaced. There is moderate calcification of the aortic valve. There is moderate thickening of the aortic valve. Aortic valve regurgitation is trivial. Mild aortic stenosis is present. Aortic valve mean gradient measures 21.0 mmHg. Aortic valve peak gradient measures 37.9 mmHg. Aortic valve area, by VTI measures 1.91 cm. There is a 21 mm Inspiris bovine pericardial valve present in the aortic position. Pulmonic Valve: The pulmonic valve was normal in structure. Pulmonic valve regurgitation is trivial. No evidence of pulmonic stenosis. Aorta: Aortic dilatation noted. There is mild dilatation of the ascending aorta, measuring 41 mm. Venous: The inferior vena cava is normal in size with greater than 50% respiratory variability, suggesting right atrial pressure of 3 mmHg. IAS/Shunts: No atrial level shunt detected by color flow Doppler.  LEFT VENTRICLE PLAX 2D LVIDd:         4.60 cm   Diastology LVIDs:         3.30 cm   LV e' medial:    6.96 cm/s LV PW:         1.40 cm   LV E/e' medial:  18.7 LV  IVS:        1.20 cm   LV e' lateral:   9.25 cm/s LVOT diam:     2.10 cm   LV E/e' lateral: 14.1 LV SV:         99 LV SV Index:   49 LVOT Area:     3.46 cm  RIGHT VENTRICLE             IVC RV Basal diam:  3.80 cm     IVC diam: 1.80 cm RV S prime:     16.60 cm/s TAPSE (M-mode): 2.5 cm LEFT ATRIUM              Index        RIGHT ATRIUM           Index LA diam:        3.70 cm  1.83 cm/m   RA Area:     21.40 cm LA Vol (A2C):   150.0 ml 74.20 ml/m  RA Volume:   75.40 ml  37.30 ml/m LA Vol (A4C):   108.0 ml 53.42 ml/m LA Biplane Vol: 131.0 ml 64.80 ml/m  AORTIC VALVE AV Area (Vmax):    1.73 cm AV Area (Vmean):   1.74 cm AV Area (VTI):     1.91 cm AV Vmax:           308.00 cm/s AV Vmean:          211.000 cm/s AV VTI:            0.517 m AV Peak Grad:      37.9 mmHg AV Mean Grad:      21.0 mmHg LVOT Vmax:         154.00 cm/s LVOT Vmean:        106.000 cm/s  LVOT VTI:          0.285 m LVOT/AV VTI ratio: 0.55  AORTA Ao Root diam: 3.10 cm Ao Asc diam:  4.10 cm MITRAL VALVE                TRICUSPID VALVE MV Area (PHT): 3.08 cm     TR Peak grad:   46.2 mmHg MV Area VTI:   2.79 cm     TR Vmax:        340.00 cm/s MV Peak grad:  6.5 mmHg MV Mean grad:  4.0 mmHg     SHUNTS MV Vmax:       1.27 m/s     Systemic VTI:  0.29 m MV Vmean:      102.0 cm/s   Systemic Diam: 2.10 cm MV Decel Time: 246 msec MV E velocity: 130.00 cm/s MV A velocity: 135.00 cm/s MV E/A ratio:  0.96 Fransico Him MD Electronically signed by Fransico Him MD Signature Date/Time: 04/15/2022/11:35:43 AM    Final    CT Chest High Resolution  Result Date: 04/14/2022 CLINICAL DATA:  Shortness of breath EXAM: CT CHEST WITHOUT CONTRAST TECHNIQUE: Multidetector CT imaging of the chest was performed following the standard protocol without intravenous contrast. High resolution imaging of the lungs, as well as inspiratory and expiratory imaging, was performed. RADIATION DOSE REDUCTION: This exam was performed according to the departmental dose-optimization program  which includes automated exposure control, adjustment of the mA and/or kV according to patient size and/or use of iterative reconstruction technique. COMPARISON:  CT abdomen pelvis, 05/15/2019 FINDINGS: Cardiovascular: Aortic atherosclerosis. Aortic valve prosthesis. Cardiomegaly. Left and right coronary artery calcifications. Dense mitral annulus calcifications. Enlargement of the main pulmonary artery measuring up to 3.8 cm in caliber. No pericardial effusion. Mediastinum/Nodes: No enlarged mediastinal, hilar, or axillary lymph nodes. Thyroid gland, trachea, and esophagus demonstrate no significant findings. Lungs/Pleura: Examination of the lungs is limited by breath motion artifact and the presence of pleural effusions. Trace bilateral pleural effusions and associated atelectasis or consolidation. Scarring and volume loss of the right lung base with elevation of the right hemidiaphragm, unchanged compared to prior examination. No specific evidence of fibrotic interstitial lung disease. Upper Abdomen: No acute abnormality. Cirrhosis and splenomegaly. Partially imaged sleeve gastrectomy. Musculoskeletal: No chest wall abnormality. No acute osseous findings. IMPRESSION: 1. Examination of the lungs is limited by breath motion artifact and the presence of pleural effusions. In general, interstitial lung disease protocol CT is of limited utility in the acute inpatient setting and should be deferred to outpatient evaluation. 2. Trace bilateral pleural effusions and associated atelectasis or consolidation. Bland appearing post infectious or inflammatory scarring and volume loss of the right lung base with elevation of the right hemidiaphragm, unchanged compared to the lung bases as included on prior examination of the abdomen. No specific evidence of fibrotic interstitial lung disease. 3. Cardiomegaly and coronary artery disease. 4. Enlargement of the main pulmonary artery, as can be seen in pulmonary hypertension. 5.  Cirrhosis and splenomegaly. Aortic Atherosclerosis (ICD10-I70.0). Electronically Signed   By: Delanna Ahmadi M.D.   On: 04/14/2022 12:47   CT ANGIO HEAD NECK W WO CM  Result Date: 04/14/2022 CLINICAL DATA:  66 year old female with double vision. Scattered punctate infarcts in the brainstem, cerebellum, left occipital lobe on MRI yesterday. EXAM: CT ANGIOGRAPHY HEAD AND NECK TECHNIQUE: Multidetector CT imaging of the head and neck was performed using the standard protocol during bolus administration of intravenous contrast. Multiplanar CT image reconstructions and MIPs were  obtained to evaluate the vascular anatomy. Carotid stenosis measurements (when applicable) are obtained utilizing NASCET criteria, using the distal internal carotid diameter as the denominator. RADIATION DOSE REDUCTION: This exam was performed according to the departmental dose-optimization program which includes automated exposure control, adjustment of the mA and/or kV according to patient size and/or use of iterative reconstruction technique. CONTRAST:  36m OMNIPAQUE IOHEXOL 350 MG/ML SOLN COMPARISON:  Head CT and brain MRI yesterday. FINDINGS: CT HEAD Brain: Multiple punctate ischemic foci detected in the brainstem, right cerebellum, and left occipital lobe remain occult by CT. No midline shift, ventriculomegaly, mass effect, evidence of mass lesion, intracranial hemorrhage or evidence of cortically based acute infarction. Calvarium and skull base: No acute osseous abnormality identified. Paranasal sinuses: Visualized paranasal sinuses and mastoids are stable and well aerated. Incidental left ethmoid osteoma (normal variant). Orbits: No acute orbit or scalp soft tissue finding. CTA NECK Skeleton: Prior sternotomy. Motion artifact in the cervical spine and at the mandible. No acute osseous abnormality identified. Upper chest: Bilateral upper lung mosaic attenuation. No superior mediastinal lymphadenopathy. Visible central pulmonary arteries  are patent. Other neck: Bilateral salivary gland atrophy and extensive parotid sialolithiasis or dystrophic calcifications. Motion artifact in the neck. No neck mass or lymphadenopathy is evident. Aortic arch: Calcified aortic atherosclerosis. Prior CABG. Three vessel arch configuration. Tortuous arch. Right carotid system: No significant brachiocephalic artery plaque or stenosis. Proximal right CCA obscured by dense subclavian venous contrast streak. But the visible right CCA is patent before the bifurcation with tortuosity but no plaque. Partially retropharyngeal course. Retropharyngeal right carotid bifurcation. Mild calcified plaque at the right ICA bulb without stenosis. Motion artifact but no evidence of right ICA stenosis to the skull base. Additional tortuosity below the skull base. Left carotid system: Mild left CCA origin calcified plaque without stenosis. Partially retropharyngeal course. Mild calcified plaque at the left ICA origin without stenosis. Motion artifact and tortuosity but no stenosis identified. Vertebral arteries: Proximal right subclavian artery appears normal. Right vertebral artery origin partially obscured by motion and streak artifact, not well evaluated. Right vertebral artery is mildly non dominant and tortuous. Motion artifact through the mid cervical spine. The right vertebral remains patent to the skull base, no obvious stenosis. Proximal left subclavian calcified plaque without stenosis. Normal left vertebral artery origin. Tortuous left V1 segment. Mildly dominant left vertebral artery with some of the V2 segment obscured by motion artifact but patent to the skull base with no obvious stenosis. CTA HEAD Posterior circulation: Vertebral V4 segments appear normal to the vertebrobasilar junction. The left is mildly dominant. Both PICA origins are patent. Patent basilar artery without stenosis. Mild motion artifact at the basilar tip. Fetal type bilateral PCA origins. SCA origins are  patent. Bilateral PCA branch motion artifact. No PCA occlusion or obvious stenosis. Anterior circulation: Both ICA siphons are patent. Mild motion artifact. Mild to moderate cavernous segment plaque bilaterally. Difficult to exclude moderate bilateral siphon stenosis but probably exaggerated from motion artifact. Both posterior communicating artery origins appear patent and normal. Patent carotid termini. Normal MCA and ACA origins. Normal anterior communicating artery. Bilateral ACA branches appear normal. Bilateral MCA M1 segments and bifurcations are patent without stenosis. Bilateral MCA branches are within normal limits. Venous sinuses: Early contrast timing, not well evaluated. Anatomic variants: Fetal type bilateral PCA origins. Mildly dominant left vertebral artery. Review of the MIP images confirms the above findings IMPRESSION: 1. Intermittently motion degraded exam. No large vessel occlusion, and generally mild for age atherosclerosis in the head and  neck. No obvious hemodynamically significant arterial stenosis. 2. Note fetal type bilateral PCA origins, in conjunction with the pattern of punctate infarcts involving the posterior fossa but also the left PCA territory, this may indicate a systemic embolic event from the heart or proximal aorta. 3. Punctate infarcts in #2 remain occult by CT. No new intracranial abnormality. 4. Prior CABG.  Aortic Atherosclerosis (ICD10-I70.0). 5. Generalized salivary gland atrophy with dystrophic calcifications or sialolithiasis. Electronically Signed   By: Genevie Ann M.D.   On: 04/14/2022 06:03   DG Chest 2 View  Result Date: 04/13/2022 CLINICAL DATA:  Short of breath EXAM: CHEST - 2 VIEW COMPARISON:  12/17/2018 FINDINGS: Frontal and lateral views of the chest demonstrate postsurgical changes from median sternotomy and aortic valve replacement. The cardiac silhouette is enlarged. There is central vascular congestion and mild diffuse interstitial prominence, without  airspace disease, effusion, or pneumothorax. Chronic elevation right hemidiaphragm. No acute bony abnormalities. IMPRESSION: 1. Findings consistent with congestive heart failure and mild interstitial edema. Electronically Signed   By: Randa Ngo M.D.   On: 04/13/2022 19:40   MR BRAIN WO CONTRAST  Result Date: 04/13/2022 CLINICAL DATA:  Double vision, syncope, unsteady on her feet EXAM: MRI HEAD WITHOUT CONTRAST TECHNIQUE: Multiplanar, multiecho pulse sequences of the brain and surrounding structures were obtained without intravenous contrast. COMPARISON:  12/17/2018 MRI head, correlation is also made with 04/13/2022 CT head FINDINGS: Brain: Punctate foci of restricted diffusion with ADC correlates in the right cerebellum (series 9, images 60 and 69), midbrain (series 9, images 75-77), left occipital lobe (series 9, images 82 and 84), and right parietal lobe (series 9, image 93). No acute hemorrhage, mass, mass effect, or midline shift. Punctate focus of hemosiderin deposition in the medial left parietal lobe, possibly sequela of prior microhemorrhage or tiny embolus. No hydrocephalus or extra-axial collection. Scattered T2 hyperintense signal in the periventricular white matter, likely the sequela of chronic small vessel ischemic disease. Partial empty sella. Normal craniocervical junction. Vascular: Normal arterial flow voids. Skull and upper cervical spine: Normal marrow signal. Sinuses/Orbits: Minimal mucosal thickening in the ethmoid air cells. The orbits are unremarkable. Other: The mastoids are well aerated. IMPRESSION: Punctate acute infarcts in the right cerebellum, midbrain, left occipital lobe, and right parietal lobe. Given multiple vascular territories, a central embolic etiology is suspected. These results were called by telephone at the time of interpretation on 04/13/2022 at 7:33 pm to provider MILLER, who verbally acknowledged these results. Electronically Signed   By: Merilyn Baba M.D.   On:  04/13/2022 19:33   CT HEAD WO CONTRAST  Result Date: 04/13/2022 CLINICAL DATA:  Double vision, unsteady gait, tingling in extremities EXAM: CT HEAD WITHOUT CONTRAST TECHNIQUE: Contiguous axial images were obtained from the base of the skull through the vertex without intravenous contrast. RADIATION DOSE REDUCTION: This exam was performed according to the departmental dose-optimization program which includes automated exposure control, adjustment of the mA and/or kV according to patient size and/or use of iterative reconstruction technique. COMPARISON:  12/17/2018 FINDINGS: Brain: No acute infarct or hemorrhage. Lateral ventricles and midline structures are unremarkable. No acute extra-axial fluid collections. No mass effect. Vascular: No hyperdense vessel or unexpected calcification. Skull: Normal. Negative for fracture or focal lesion. Sinuses/Orbits: No acute finding. Stable osteoma left ethmoid air cell. Other: None. IMPRESSION: 1. No acute intracranial process. Electronically Signed   By: Randa Ngo M.D.   On: 04/13/2022 17:43     PHYSICAL EXAM  Temp:  [98.1 F (36.7 C)-100.1 F (  37.8 C)] 98.3 F (36.8 C) (12/10 1156) Pulse Rate:  [81-95] 82 (12/10 1156) Resp:  [18-20] 18 (12/10 1156) BP: (108-122)/(62-79) 121/79 (12/10 1156) SpO2:  [92 %-95 %] 95 % (12/10 1156)  General - obese, well developed, in no apparent distress.  Ophthalmologic - fundi not visualized due to noncooperation.  Cardiovascular - Regular rhythm and rate.  Mental Status -  Level of arousal and orientation to time, place, and person were intact. Language including expression, naming, repetition, comprehension was assessed and found intact. Fund of Knowledge was assessed and was intact.  Cranial Nerves II - XII - II - Visual field intact OU. III, IV, VI - Extraocular movements intact.  No disconjugate V - Facial sensation intact bilaterally. VII - Facial movement intact bilaterally. VIII - Hearing &  vestibular intact bilaterally.  No nystagmus X - Palate elevates symmetrically. XI - Chin turning & shoulder shrug intact bilaterally. XII - Tongue protrusion intact.  Motor Strength - The patient's strength was symmetrical in all extremities and pronator drift was absent.  Bulk was normal and fasciculations were absent.   Motor Tone - Muscle tone was assessed at the neck and appendages and was normal.  Reflexes - The patient's reflexes were symmetrical in all extremities and she had no pathological reflexes.  Sensory - Light touch, temperature/pinprick were assessed and were symmetrical.    Coordination - The patient had normal movements in the hands with no ataxia or dysmetria.  Tremor was absent.  Gait and Station - deferred.   ASSESSMENT/PLAN Ms. ENNA WARWICK is a 66 y.o. female with history of diabetes, cirrhosis, Sjogren's disease, OSA, remote rheumatic fever with AVR admitted for episode of imbalance, diplopia, nausea and numbness. No tPA given due to outside window.    Stroke: Scattered punctate infarcts at right cerebellum, midbrain, left occipital and right parietal, etiology unclear, cardioembolic versus endocarditis CT no acute abnormality CTA head and neck bilateral fetal PCAs MRI right cerebellum, midbrain, left occipital, right parietal punctate infarcts 2D Echo EF 60-65% LE venous doppler pending 10/2020 30-day heart monitoring showed short runs of PACs Recommend loop recorder prior to discharge LDL 97 HgbA1c pending Heparin subcu for VTE prophylaxis No antithrombotic prior to admission, now on aspirin 81 mg daily and plavix 75 DAPT for 3 weeks and then ASA alone.   Patient counseled to be compliant with her antithrombotic medications Ongoing aggressive stroke risk factor management Therapy recommendations: Home health PT Disposition: Pending  Fever Tmax 102.9->afebrile  WBC 2.3->3.0->3.0 Blood culture NGTD 2D echo no evidence of  endocarditis  Diabetes HgbA1c pending goal < 7.0 Controlled CBG monitoring SSI DM education and close PCP follow up  Hypertension Stable Long term BP goal normotensive  Hyperlipidemia Home meds: Zetia LDL 97, goal < 70 Now on Zetia Not a candidate for statin due to cirrhosis Continue Zetia at discharge  Other Stroke Risk Factors Advanced age ETOH use, recommend no more than 1 drink per day Obesity, Body mass index is 37.09 kg/m.  Obstructive sleep apnea  Other Active Problems Sjogren's disease Cirrhosis  Hospital day # 2    Rosalin Hawking, MD PhD Stroke Neurology 04/15/2022 3:23 PM    To contact Stroke Continuity provider, please refer to [email or web address] After hours, contact General Neurology

## 2022-04-15 NOTE — Progress Notes (Signed)
[facility] PROGRESS NOTE   Whitney [name] HTD:[id number] DOB: 18-Jul-1955 DOA: 04/13/2022  PCP: Lawerance Cruel, MD  Brief History/Interval Summary: 66 y.o. female with medical history significant of  Anemia, Asthma, Cirrhosis,DMII, GERD, Depression, fibromyalgia, hx of heart murmur (due to rheumatic fever 1989)  s/p aortic valve replacement 2019, Sjogren's syndrome, hx of OSA on cpap patient presented to the ED with complaint of double vision, unsteady gate, feeling of disequilibrium as well as paresthesias in b/l fingers and mouth.    Consultants: Neurology  Procedures: Transthoracic echocardiogram is pending    Subjective/Interval History: No recurrence of fever.  Patient noted to be moving around in the room without difficulty.  She denies any headaches.  No chest pain or shortness of breath.  No nausea or vomiting.     Assessment/Plan:  Acute stroke Patient's symptoms included double vision disequilibrium and paresthesias of hands. Multiple areas of infarct noted on MRI.  Concern for cardioembolic etiology. Neurology has been consulted. Patient underwent MRI brain and CT angiogram head and neck. Echocardiogram is pending. LDL is 97.  HbA1c is pending. Patient noted to be on Zetia.  Due to her history of NASH she does not appear to be a candidate for statin. Patient started on aspirin.  Neurology to consider dual antiplatelet treatment based on results of further testing. PT OT SLP following.  Home health recommended.    Fever No clear source of infection identified.  Fever appears to have subsided  Noted to have leukopenia which is chronic for her.  No obvious source of infection identified.  UA was noted to be unremarkable.  Patient does not have any diarrhea.   COVID-19 RSV and influenza PCR's were negative.  Respiratory viral panel was negative.  Strep screen was negative.  Procalcitonin noted to be less than 0.1 and remains low this morning. Blood cultures  pending.  Echocardiogram is pending. Continue to monitor off of antibiotics.    Elevated D-dimer Low suspicion for venous thromboembolism.  Elevated D-dimer is likely due to her history of liver cirrhosis.  Will order lower extremity Doppler studies.  Abnormal chest x-ray Chest x-ray suggested pulmonary edema.  BNP is only 293.   Patient does not have any respiratory symptoms.  CT chest was limited study.  Trace bilateral pleural effusions and atelectasis noted.  Patient has elevation of the right hemidiaphragm which is chronic for her.  No clear infiltrates identified. Patient with minimal respiratory symptoms.  Saturating normal on room air.  Will wait and see what the echocardiogram shows but do not think that she needs further workup at this time.  As mentioned above her respiratory viral panel has been negative.   History of asthma Stable.  No wheezing appreciated.  History of liver cirrhosis secondary to NASH Followed at [facility].  Has associated leukopenia and thrombocytopenia.  LFTs are stable. Nadolol on hold due to permissive hypertension needed for acute stroke.  Leukopenia/thrombocytopenia Presumably related to NASH.  Followed at [facility].  Diabetes mellitus type 2 Follow-up HbA1c.  SSI.  GERD PPI  History of rheumatic fever/aortic valve replacement in 2019 Followed by Dr. Irish Lack with cardiology.  Follow-up on echocardiogram.  History of Sjogren's syndrome Stable  History of sleep apnea CPAP  Obesity Estimated body mass index is 37.09 kg/m as calculated from the following:   Height as of this encounter: 5' 4"  (1.626 m).   Weight as of this encounter: 98 kg.   DVT Prophylaxis: Subcutaneous heparin Code Status: Full code Family Communication:  Discussed with patient.  Will update her granddaughter later today. Disposition Plan: Home with home health  Status is: Inpatient Remains inpatient appropriate because: Acute stroke     Medications: Scheduled:   arformoterol  15 mcg Nebulization BID   And   umeclidinium bromide  1 puff Inhalation Daily   aspirin  81 mg Oral Daily   cyanocobalamin  1,000 mcg Intramuscular Once   ezetimibe  10 mg Oral QHS   heparin  2,500 Units Subcutaneous Q8H   Continuous: OZH:[id number] **OR** acetaminophen (TYLENOL) oral liquid 160 mg/5 mL **OR** acetaminophen, albuterol, senna-docusate  Antibiotics: Anti-infectives (From admission, onward)    None       Objective:  Vital Signs  Vitals:   04/14/22 2323 04/15/22 0334 04/15/22 0743 04/15/22 0843  BP: 120/76 110/66 122/69   Pulse: 87 85 81   Resp: 20 18 18    Temp: 100.1 F (37.8 C) 98.1 F (36.7 C)    TempSrc: Axillary Oral    SpO2: 92% 93% 93% 95%  Weight:      Height:        Intake/Output Summary (Last 24 hours) at 04/15/2022 0948 Last data filed at 04/15/2022 0800 Gross per 24 hour  Intake 960 ml  Output --  Net 960 ml   Filed Weights   04/13/22 1753  Weight: 98 kg    General appearance: Awake alert.  In no distress Resp: Clear to auscultation bilaterally.  Normal effort Cardio: S1-S2 is normal regular.  No S3-S4.  No rubs murmurs or bruit GI: Abdomen is soft.  Nontender nondistended.  Bowel sounds are present normal.  No masses organomegaly Extremities: No edema.  Full range of motion of lower extremities. Neurologic: Alert and oriented x3.  No focal neurological deficits.     Lab Results:  Data Reviewed: I have personally reviewed following labs and reports of the imaging studies  CBC: Recent Labs  Lab 04/13/22 1707 04/13/22 1730 04/13/22 1731 04/13/22 2235 04/15/22 0248  WBC 2.3*  --   --  3.0* 3.0*  NEUTROABS 1.1*  --   --   --   --   HGB 13.1 12.9 13.3 13.4 12.4  HCT 40.1 38.0 39.0 41.3 37.8  MCV 94.4  --   --  94.1 93.6  PLT 76*  --   --  77* 79*     Basic Metabolic Panel: Recent Labs  Lab 04/13/22 1707 04/13/22 1730 04/13/22 1731 04/13/22 2235 04/15/22 0248  NA 139 141 141 138 136  K 3.9 3.7  3.7 4.4 3.7  CL 104 102  --  105 104  CO2 28  --   --  24 25  GLUCOSE 122* 116*  --  83 110*  BUN 14 14  --  15 15  CREATININE 0.88 0.80  --  0.81 0.71  CALCIUM 8.2*  --   --  8.0* 8.0*  MG 1.8  --   --   --   --      GFR: Estimated Creatinine Clearance: 76.5 mL/min (by C-G formula based on SCr of 0.71 mg/dL).  Liver Function Tests: Recent Labs  Lab 04/13/22 1707 04/13/22 2235 04/15/22 0248  AST 31 40 25  ALT 12 13 11   ALKPHOS 55 54 52  BILITOT 0.9 1.1 1.1  PROT 5.6* 5.9* 5.3*  ALBUMIN 2.4* 2.5* 2.1*      Coagulation Profile: Recent Labs  Lab 04/13/22 1707  INR 1.3*     CBG: Recent Labs  Lab 04/14/22 0041  GLUCAP 91     Lipid Profile: Recent Labs    04/14/22 0242  CHOL 140  HDL 29*  LDLCALC 97  TRIG 70  CHOLHDL 4.8      Anemia Panel: Recent Labs    04/13/22 2235 04/14/22 0242  FOLATE 10.2 10.0     Recent Results (from the past 240 hour(s))  Resp panel by RT-PCR (RSV, Flu A&B, Covid) Anterior Nasal Swab     Status: None   Collection Time: 04/13/22  6:07 PM   Specimen: Anterior Nasal Swab  Result Value Ref Range Status   SARS Coronavirus 2 by RT PCR NEGATIVE NEGATIVE Final    Comment: (NOTE) SARS-CoV-2 target nucleic acids are NOT DETECTED.  The SARS-CoV-2 RNA is generally detectable in upper respiratory specimens during the acute phase of infection. The lowest concentration of SARS-CoV-2 viral copies this assay can detect is 138 copies/mL. A negative result does not preclude SARS-Cov-2 infection and should not be used as the sole basis for treatment or other patient management decisions. A negative result may occur with  improper specimen collection/handling, submission of specimen other than nasopharyngeal swab, presence of viral mutation(s) within the areas targeted by this assay, and inadequate number of viral copies(<138 copies/mL). A negative result must be combined with clinical observations, patient history, and  epidemiological information. The expected result is Negative.  Fact Sheet for Patients:  EntrepreneurPulse.com.au  Fact Sheet for Healthcare Providers:  IncredibleEmployment.be  This test is no t yet approved or cleared by the [place] FDA and  has been authorized for detection and/or diagnosis of SARS-CoV-2 by FDA under an Emergency Use Authorization (EUA). This EUA will remain  in effect (meaning this test can be used) for the duration of the COVID-19 declaration under Section 564(b)(1) of the Act, 21 U.S.C.section 360bbb-3(b)(1), unless the authorization is terminated  or revoked sooner.       Influenza A by PCR NEGATIVE NEGATIVE Final   Influenza B by PCR NEGATIVE NEGATIVE Final    Comment: (NOTE) The Xpert Xpress SARS-CoV-2/FLU/RSV plus assay is intended as an aid in the diagnosis of influenza from Nasopharyngeal swab specimens and should not be used as a sole basis for treatment. Nasal washings and aspirates are unacceptable for Xpert Xpress SARS-CoV-2/FLU/RSV testing.  Fact Sheet for Patients: EntrepreneurPulse.com.au  Fact Sheet for Healthcare Providers: IncredibleEmployment.be  This test is not yet approved or cleared by the [place] FDA and has been authorized for detection and/or diagnosis of SARS-CoV-2 by FDA under an Emergency Use Authorization (EUA). This EUA will remain in effect (meaning this test can be used) for the duration of the COVID-19 declaration under Section 564(b)(1) of the Act, 21 U.S.C. section 360bbb-3(b)(1), unless the authorization is terminated or revoked.     Resp Syncytial Virus by PCR NEGATIVE NEGATIVE Final    Comment: (NOTE) Fact Sheet for Patients: EntrepreneurPulse.com.au  Fact Sheet for Healthcare Providers: IncredibleEmployment.be  This test is not yet approved or cleared by the [place] FDA and has been  authorized for detection and/or diagnosis of SARS-CoV-2 by FDA under an Emergency Use Authorization (EUA). This EUA will remain in effect (meaning this test can be used) for the duration of the COVID-19 declaration under Section 564(b)(1) of the Act, 21 U.S.C. section 360bbb-3(b)(1), unless the authorization is terminated or revoked.  Performed at [facility], [place] [address] [place], [place] [address]   Culture, blood (Routine X 2) w Reflex to ID Panel     Status: None (Preliminary  result)   Collection Time: 04/13/22  9:02 PM   Specimen: BLOOD  Result Value Ref Range Status   Specimen Description BLOOD SITE NOT SPECIFIED  Final   Special Requests   Final    BOTTLES DRAWN AEROBIC AND ANAEROBIC Blood Culture results may not be optimal due to an inadequate volume of blood received in culture bottles   Culture   Final    NO GROWTH < 12 HOURS Performed at [facility], [address] [place], [place] [address]    Report Status PENDING  Incomplete  Culture, blood (Routine X 2) w Reflex to ID Panel     Status: None (Preliminary result)   Collection Time: 04/14/22  2:36 AM   Specimen: BLOOD  Result Value Ref Range Status   Specimen Description BLOOD BLOOD LEFT HAND  Final   Special Requests   Final    BOTTLES DRAWN AEROBIC AND ANAEROBIC Blood Culture adequate volume   Culture   Final    NO GROWTH < 12 HOURS Performed at [facility], [place] [address] [place], [place] [address]    Report Status PENDING  Incomplete  Respiratory (~20 pathogens) panel by PCR     Status: None   Collection Time: 04/14/22 10:55 AM   Specimen: Nasopharyngeal Swab; Respiratory  Result Value Ref Range Status   Adenovirus NOT DETECTED NOT DETECTED Final   Coronavirus 229E NOT DETECTED NOT DETECTED Final    Comment: (NOTE) The Coronavirus on the Respiratory Panel, DOES NOT test for the novel  Coronavirus (2019 nCoV)    Coronavirus HKU1 NOT DETECTED NOT DETECTED Final   Coronavirus NL63  NOT DETECTED NOT DETECTED Final   Coronavirus OC43 NOT DETECTED NOT DETECTED Final   Metapneumovirus NOT DETECTED NOT DETECTED Final   Rhinovirus / Enterovirus NOT DETECTED NOT DETECTED Final   Influenza A NOT DETECTED NOT DETECTED Final   Influenza B NOT DETECTED NOT DETECTED Final   Parainfluenza Virus 1 NOT DETECTED NOT DETECTED Final   Parainfluenza Virus 2 NOT DETECTED NOT DETECTED Final   Parainfluenza Virus 3 NOT DETECTED NOT DETECTED Final   Parainfluenza Virus 4 NOT DETECTED NOT DETECTED Final   Respiratory Syncytial Virus NOT DETECTED NOT DETECTED Final   Bordetella pertussis NOT DETECTED NOT DETECTED Final   Bordetella Parapertussis NOT DETECTED NOT DETECTED Final   Chlamydophila pneumoniae NOT DETECTED NOT DETECTED Final   Mycoplasma pneumoniae NOT DETECTED NOT DETECTED Final    Comment: Performed at [facility], [place]. [address] [place], [place] [address]  Group A Strep by PCR     Status: None   Collection Time: 04/14/22 10:56 AM   Specimen: Throat; Sterile Swab  Result Value Ref Range Status   Group A Strep by PCR NOT DETECTED NOT DETECTED Final    Comment: Performed at [facility], [place] [address] [place], [place] [address]      Radiology Studies: CT Chest High Resolution  Result Date: 04/14/2022 CLINICAL DATA:  Shortness of breath EXAM: CT CHEST WITHOUT CONTRAST TECHNIQUE: Multidetector CT imaging of the chest was performed following the standard protocol without intravenous contrast. High resolution imaging of the lungs, as well as inspiratory and expiratory imaging, was performed. RADIATION DOSE REDUCTION: This exam was performed according to the departmental dose-optimization program which includes automated exposure control, adjustment of the mA and/or kV according to patient size and/or use of iterative reconstruction technique. COMPARISON:  CT abdomen pelvis, 05/15/2019 FINDINGS: Cardiovascular: Aortic atherosclerosis. Aortic valve prosthesis.  Cardiomegaly. Left and right coronary artery calcifications. Dense mitral annulus calcifications. Enlargement of the main pulmonary artery measuring up to 3.8 cm in caliber. No pericardial effusion. Mediastinum/Nodes: No enlarged mediastinal, hilar, or axillary lymph nodes. Thyroid gland, trachea, and esophagus demonstrate no significant findings. Lungs/Pleura: Examination of the lungs is limited by breath motion artifact and the presence of pleural effusions. Trace bilateral pleural effusions and associated atelectasis or consolidation. Scarring and volume loss of the right lung base with elevation of the right hemidiaphragm, unchanged compared to prior examination. No specific evidence of fibrotic interstitial lung disease. Upper Abdomen: No acute abnormality. Cirrhosis and splenomegaly. Partially imaged sleeve gastrectomy. Musculoskeletal: No chest wall abnormality. No acute osseous findings. IMPRESSION: 1. Examination of the lungs is limited by breath motion artifact and the presence of pleural effusions. In general, interstitial lung disease protocol CT is of limited utility in the acute inpatient setting and should be deferred to outpatient evaluation. 2. Trace bilateral pleural effusions and associated atelectasis or consolidation. Bland appearing post infectious or inflammatory scarring and volume loss of the right lung base with elevation of the right hemidiaphragm, unchanged compared to the lung bases as included on prior examination of the abdomen. No specific evidence of fibrotic interstitial lung disease. 3. Cardiomegaly and coronary artery disease. 4. Enlargement of the main pulmonary artery, as can be seen in pulmonary hypertension. 5. Cirrhosis and splenomegaly. Aortic Atherosclerosis (ICD10-I70.0). Electronically Signed   By: Delanna Ahmadi M.D.   On: 04/14/2022 12:47   CT ANGIO HEAD NECK W WO CM  Result Date: 04/14/2022 CLINICAL DATA:  66 year old female with double vision. Scattered punctate  infarcts in the brainstem, cerebellum, left occipital lobe on MRI yesterday. EXAM: CT ANGIOGRAPHY HEAD AND NECK TECHNIQUE: Multidetector CT imaging of the head and neck was performed using the standard protocol during bolus administration of intravenous contrast. Multiplanar CT image reconstructions and MIPs were obtained to evaluate the vascular anatomy. Carotid stenosis measurements (when applicable) are obtained utilizing NASCET criteria, using the distal internal carotid diameter as the denominator. RADIATION DOSE REDUCTION: This exam was performed according to the departmental dose-optimization program which includes automated exposure control, adjustment of the mA and/or kV according to patient size and/or use of iterative reconstruction technique. CONTRAST:  32m OMNIPAQUE IOHEXOL 350 MG/ML SOLN COMPARISON:  Head CT and brain MRI yesterday. FINDINGS: CT HEAD Brain: Multiple punctate ischemic foci detected in the brainstem, right cerebellum, and left occipital lobe remain occult by CT. No midline shift, ventriculomegaly, mass effect, evidence of mass lesion, intracranial hemorrhage or evidence of cortically based acute infarction. Calvarium and skull base: No acute osseous abnormality identified. Paranasal sinuses: Visualized paranasal sinuses and mastoids are stable and well aerated. Incidental left ethmoid osteoma (normal variant). Orbits: No acute orbit or scalp soft tissue finding. CTA NECK Skeleton: Prior sternotomy. Motion artifact in the cervical spine and at the mandible. No acute osseous abnormality identified. Upper chest: Bilateral upper lung mosaic attenuation. No superior mediastinal lymphadenopathy. Visible central pulmonary arteries are patent. Other neck: Bilateral salivary gland atrophy and extensive parotid sialolithiasis or dystrophic calcifications. Motion artifact in the neck. No neck mass or lymphadenopathy is evident. Aortic arch: Calcified aortic atherosclerosis. Prior CABG. Three  vessel arch configuration. Tortuous arch. Right carotid system: No significant brachiocephalic artery plaque or stenosis. Proximal right CCA obscured by dense subclavian venous contrast streak. But the visible right CCA is patent before the bifurcation with tortuosity but no plaque. Partially retropharyngeal course. Retropharyngeal right carotid bifurcation. Mild calcified plaque at the right  ICA bulb without stenosis. Motion artifact but no evidence of right ICA stenosis to the skull base. Additional tortuosity below the skull base. Left carotid system: Mild left CCA origin calcified plaque without stenosis. Partially retropharyngeal course. Mild calcified plaque at the left ICA origin without stenosis. Motion artifact and tortuosity but no stenosis identified. Vertebral arteries: Proximal right subclavian artery appears normal. Right vertebral artery origin partially obscured by motion and streak artifact, not well evaluated. Right vertebral artery is mildly non dominant and tortuous. Motion artifact through the mid cervical spine. The right vertebral remains patent to the skull base, no obvious stenosis. Proximal left subclavian calcified plaque without stenosis. Normal left vertebral artery origin. Tortuous left V1 segment. Mildly dominant left vertebral artery with some of the V2 segment obscured by motion artifact but patent to the skull base with no obvious stenosis. CTA HEAD Posterior circulation: Vertebral V4 segments appear normal to the vertebrobasilar junction. The left is mildly dominant. Both PICA origins are patent. Patent basilar artery without stenosis. Mild motion artifact at the basilar tip. Fetal type bilateral PCA origins. SCA origins are patent. Bilateral PCA branch motion artifact. No PCA occlusion or obvious stenosis. Anterior circulation: Both ICA siphons are patent. Mild motion artifact. Mild to moderate cavernous segment plaque bilaterally. Difficult to exclude moderate bilateral siphon  stenosis but probably exaggerated from motion artifact. Both posterior communicating artery origins appear patent and normal. Patent carotid termini. Normal MCA and ACA origins. Normal anterior communicating artery. Bilateral ACA branches appear normal. Bilateral MCA M1 segments and bifurcations are patent without stenosis. Bilateral MCA branches are within normal limits. Venous sinuses: Early contrast timing, not well evaluated. Anatomic variants: Fetal type bilateral PCA origins. Mildly dominant left vertebral artery. Review of the MIP images confirms the above findings IMPRESSION: 1. Intermittently motion degraded exam. No large vessel occlusion, and generally mild for age atherosclerosis in the head and neck. No obvious hemodynamically significant arterial stenosis. 2. Note fetal type bilateral PCA origins, in conjunction with the pattern of punctate infarcts involving the posterior fossa but also the left PCA territory, this may indicate a systemic embolic event from the heart or proximal aorta. 3. Punctate infarcts in #2 remain occult by CT. No new intracranial abnormality. 4. Prior CABG.  Aortic Atherosclerosis (ICD10-I70.0). 5. Generalized salivary gland atrophy with dystrophic calcifications or sialolithiasis. Electronically Signed   By: Genevie Ann M.D.   On: 04/14/2022 06:03   DG Chest 2 View  Result Date: 04/13/2022 CLINICAL DATA:  Short of breath EXAM: CHEST - 2 VIEW COMPARISON:  12/17/2018 FINDINGS: Frontal and lateral views of the chest demonstrate postsurgical changes from median sternotomy and aortic valve replacement. The cardiac silhouette is enlarged. There is central vascular congestion and mild diffuse interstitial prominence, without airspace disease, effusion, or pneumothorax. Chronic elevation right hemidiaphragm. No acute bony abnormalities. IMPRESSION: 1. Findings consistent with congestive heart failure and mild interstitial edema. Electronically Signed   By: Randa Ngo M.D.   On:  04/13/2022 19:40   MR BRAIN WO CONTRAST  Result Date: 04/13/2022 CLINICAL DATA:  Double vision, syncope, unsteady on her feet EXAM: MRI HEAD WITHOUT CONTRAST TECHNIQUE: Multiplanar, multiecho pulse sequences of the brain and surrounding structures were obtained without intravenous contrast. COMPARISON:  12/17/2018 MRI head, correlation is also made with 04/13/2022 CT head FINDINGS: Brain: Punctate foci of restricted diffusion with ADC correlates in the right cerebellum (series 9, images 60 and 69), midbrain (series 9, images 75-77), left occipital lobe (series 9, images 82 and 84), and  right parietal lobe (series 9, image 93). No acute hemorrhage, mass, mass effect, or midline shift. Punctate focus of hemosiderin deposition in the medial left parietal lobe, possibly sequela of prior microhemorrhage or tiny embolus. No hydrocephalus or extra-axial collection. Scattered T2 hyperintense signal in the periventricular white matter, likely the sequela of chronic small vessel ischemic disease. Partial empty sella. Normal craniocervical junction. Vascular: Normal arterial flow voids. Skull and upper cervical spine: Normal marrow signal. Sinuses/Orbits: Minimal mucosal thickening in the ethmoid air cells. The orbits are unremarkable. Other: The mastoids are well aerated. IMPRESSION: Punctate acute infarcts in the right cerebellum, midbrain, left occipital lobe, and right parietal lobe. Given multiple vascular territories, a central embolic etiology is suspected. These results were called by telephone at the time of interpretation on 04/13/2022 at 7:33 pm to provider MILLER, who verbally acknowledged these results. Electronically Signed   By: Merilyn Baba M.D.   On: 04/13/2022 19:33   CT HEAD WO CONTRAST  Result Date: 04/13/2022 CLINICAL DATA:  Double vision, unsteady gait, tingling in extremities EXAM: CT HEAD WITHOUT CONTRAST TECHNIQUE: Contiguous axial images were obtained from the base of the skull through the  vertex without intravenous contrast. RADIATION DOSE REDUCTION: This exam was performed according to the departmental dose-optimization program which includes automated exposure control, adjustment of the mA and/or kV according to patient size and/or use of iterative reconstruction technique. COMPARISON:  12/17/2018 FINDINGS: Brain: No acute infarct or hemorrhage. Lateral ventricles and midline structures are unremarkable. No acute extra-axial fluid collections. No mass effect. Vascular: No hyperdense vessel or unexpected calcification. Skull: Normal. Negative for fracture or focal lesion. Sinuses/Orbits: No acute finding. Stable osteoma left ethmoid air cell. Other: None. IMPRESSION: 1. No acute intracranial process. Electronically Signed   By: Randa Ngo M.D.   On: 04/13/2022 17:43       LOS: 2 days   [facility] Pager on [email or web address]  04/15/2022, 9:48 AM

## 2022-04-15 NOTE — Plan of Care (Signed)
  Problem: Education: Goal: Knowledge of disease or condition will improve Outcome: Progressing   Problem: Coping: Goal: Will identify appropriate support needs Outcome: Progressing   Problem: Health Behavior/Discharge Planning: Goal: Ability to manage health-related needs will improve Outcome: Progressing

## 2022-04-15 NOTE — TOC Initial Note (Signed)
Transition of Care [facility]) - Initial/Assessment Note    Patient Details  Name: Whitney [name] MRN: [id number] Date of Birth: 22-Apr-1956  Transition of Care [facility]) CM/SW Contact:    Carles Collet, RN Phone Number: 04/15/2022, 9:33 AM  Clinical Narrative:                 Returned call to daughter to answer [facility] questions. She states that the patient lives at home, patient's son lives with her and he is currently at [facility] and anticipating DC soon as well.  CM updated daughter on PT OT recs, and DME recs. Daughter agreed with plan for [facility] support. Spoke w patient to discuss [place] options and she deferred choice of agency to CM. She would like cane. Cane ordered to be delivered to room today, and [facility] services accepted by [place].   Expected Discharge Plan: [place] Barriers to Discharge: Continued Medical Work up   Patient Goals and CMS Choice Patient states their goals for this hospitalization and ongoing recovery are:: to go home CMS Medicare.gov Compare Post Acute Care list provided to:: Patient Choice offered to / list presented to : Patient  Expected Discharge Plan and Services Expected Discharge Plan: [place]   Discharge Planning Services: CM Consult Post Acute Care Choice: Home Health, Durable Medical Equipment Living arrangements for the past 2 months: Single Family Home                 DME Arranged: Kasandra Knudsen DME Agency: [employer] Date DME Agency Contacted: 04/15/22 Time DME Agency Contacted: [phone] Representative spoke with at DME Agency: [place]: PT [place]: [place] Date [place]: 04/15/22 Time HH Agency Contacted: [id number] Representative spoke with at [place]: [place] Arrangements/Services Living arrangements for the past 2 months: [place] Lives with:: Adult Children   Do you feel safe going back to the place where you live?: Yes               Activities of Daily Living Home  Assistive Devices/Equipment: None ADL Screening (condition at time of admission) Patient's cognitive ability adequate to safely complete daily activities?: Yes Is the patient deaf or have difficulty hearing?: No Does the patient have difficulty seeing, even when wearing glasses/contacts?: No Does the patient have difficulty concentrating, remembering, or making decisions?: No Patient able to express need for assistance with ADLs?: No Does the patient have difficulty dressing or bathing?: Yes Independently performs ADLs?: Yes (appropriate for developmental age) Does the patient have difficulty walking or climbing stairs?: No Weakness of Legs: None Weakness of Arms/Hands: None  Permission Sought/Granted                  Emotional Assessment              Admission diagnosis:  Shortness of breath [R06.02] Weakness [R53.1] Acute ischemic stroke (HCC) [I63.9] Acute CVA (cerebrovascular accident) ([place]) [I63.9] Tingling of upper extremity and face [R20.2] Patient Active Problem List   Diagnosis Date Noted   Acute CVA (cerebrovascular accident) ([place]) 04/13/2022   COPD (chronic obstructive pulmonary disease) ([place]) 01/18/2021   Dyspnea 01/06/2019   Paroxysmal atrial fibrillation ([place]) 01/24/2018   S/P AVR 12/05/2017   Severe aortic stenosis    Inguinal hernia 10/22/2016   Lower extremity edema 06/22/2015   Aortic valve disorder 04/21/2013   Edema 04/21/2013   Depression    OSA (obstructive sleep apnea)    Sjogren's  syndrome ([place])    Cirrhosis ([place])    Hyperlipidemia    Fibromyalgia    Postmenopausal bleeding 12/11/2012   Endometrial polyp 12/11/2012   Chronic cough 04/11/2011   PCP:  Lawerance Cruel, MD Pharmacy:   [id number] [place] [id number]: [phone]: [phone]    Social Determinants of Health (SDOH) Interventions     Readmission Risk Interventions     No data to display

## 2022-04-16 ENCOUNTER — Encounter (HOSPITAL_COMMUNITY)
Admission: EM | Disposition: A | Discharge status: Home Health | Payer: Self-pay | Source: Home / Self Care | Attending: Internal Medicine

## 2022-04-16 ENCOUNTER — Inpatient Hospital Stay (HOSPITAL_COMMUNITY): Payer: Medicare PPO

## 2022-04-16 ENCOUNTER — Encounter (HOSPITAL_COMMUNITY): Payer: Self-pay | Admitting: Internal Medicine

## 2022-04-16 DIAGNOSIS — R509 Fever, unspecified: Secondary | ICD-10-CM

## 2022-04-16 DIAGNOSIS — I639 Cerebral infarction, unspecified: Secondary | ICD-10-CM

## 2022-04-16 HISTORY — PX: LOOP RECORDER INSERTION: EP1214

## 2022-04-16 LAB — HEMOGLOBIN A1C
Hgb A1c MFr Bld: 5.4 % (ref 4.8–5.6)
Mean Plasma Glucose: 108 mg/dL

## 2022-04-16 LAB — PROTEIN ELECTROPHORESIS, SERUM
A/G Ratio: 1 (ref 0.7–1.7)
Albumin ELP: 2.6 g/dL — ABNORMAL LOW (ref 2.9–4.4)
Alpha-1-Globulin: 0.2 g/dL (ref 0.0–0.4)
Alpha-2-Globulin: 0.5 g/dL (ref 0.4–1.0)
Beta Globulin: 0.8 g/dL (ref 0.7–1.3)
Gamma Globulin: 1.1 g/dL (ref 0.4–1.8)
Globulin, Total: 2.5 g/dL (ref 2.2–3.9)
Total Protein ELP: 5.1 g/dL — ABNORMAL LOW (ref 6.0–8.5)

## 2022-04-16 LAB — PROCALCITONIN: Procalcitonin: <0.1 ng/mL

## 2022-04-16 SURGERY — LOOP RECORDER INSERTION

## 2022-04-16 MED ORDER — CYANOCOBALAMIN 1000 MCG/ML IJ SOLN: 1000.0000 ug | Freq: Once | INTRAMUSCULAR | Status: DC

## 2022-04-16 MED ORDER — LIDOCAINE-EPINEPHRINE 1 %-1:100000 IJ SOLN
INTRAMUSCULAR | Status: DC | PRN
  Administered 2022-04-16: 20 mL

## 2022-04-16 MED ORDER — NADOLOL 20 MG PO TABS
20.0000 mg | ORAL_TABLET | Freq: Every day | ORAL | Status: DC
  Administered 2022-04-16: 20 mg via ORAL
  Filled 2022-04-16: qty 1

## 2022-04-16 MED ORDER — CLOPIDOGREL BISULFATE 75 MG PO TABS: 75.0000 mg | ORAL_TABLET | Freq: Every day | ORAL | 0 refills | Status: DC

## 2022-04-16 MED ORDER — LIDOCAINE-EPINEPHRINE 1 %-1:100000 IJ SOLN
INTRAMUSCULAR | Status: AC
  Filled 2022-04-16: qty 1

## 2022-04-16 MED ORDER — ASPIRIN 81 MG PO TBEC: 81.0000 mg | DELAYED_RELEASE_TABLET | Freq: Every day | ORAL | 2 refills | Status: DC

## 2022-04-16 SURGICAL SUPPLY — 2 items
PACK LOOP INSERTION (CUSTOM PROCEDURE TRAY) ×1 IMPLANT
SYSTEM MONITOR REVEAL LINQ II (Prosthesis & Implant Heart) IMPLANT

## 2022-04-16 NOTE — Discharge Instructions (Signed)
Care After Your Loop Recorder  You have a Med[employer]onic Loop Recorder   Monitor your cardiac device site for redness, swelling, and drainage. Call the device clinic at [phone] if you experience these symptoms or fever/chills.  If you notice bleeding from your site, hold firm, but gently pressure with two fingers for 5 minutes. Dried blood on the steri-strips when removing the outer bandage is normal.   Keep the large square bandage on your site for 24 hours and then you may remove it yourself. Keep the steri-strips underneath in place.   You may shower after 72 hours / 3 days from your procedure with the steri-strips in place. They will usually fall off on their own, or may be removed after 10 days. Pat dry.   Avoid lotions, ointments, or perfumes over your incision until it is well-healed.  Please do not submerge in water until your site is completely healed.   Your device is MRI compatible.   Remote monitoring is used to monitor your cardiac device from home. This monitoring is scheduled every month by our office. It allows [place] to keep an eye on the function of your device to ensure it is working properly.

## 2022-04-16 NOTE — Progress Notes (Addendum)
STROKE TEAM PROGRESS NOTE   SUBJECTIVE (INTERVAL HISTORY) Patient seen ambulating in room. She was awake and alert, no neuro change. Discussed with patient the need for loop recorder.  OBJECTIVE Temp:  [98.2 F (36.8 C)-98.7 F (37.1 C)] 98.6 F (37 C) (12/11 0812) Pulse Rate:  [82-88] 86 (12/11 0812) Cardiac Rhythm: Normal sinus rhythm (12/11 0700) Resp:  [16-18] 18 (12/11 0812) BP: (118-135)/(64-79) 119/79 (12/11 0812) SpO2:  [93 %-100 %] 96 % (12/11 0812)  Recent Labs  Lab 04/14/22 0041  GLUCAP 91   Recent Labs  Lab 04/13/22 1707 04/13/22 1730 04/13/22 1731 04/13/22 2235 04/15/22 0248  NA 139 141 141 138 136  K 3.9 3.7 3.7 4.4 3.7  CL 104 102  --  105 104  CO2 28  --   --  24 25  GLUCOSE 122* 116*  --  83 110*  BUN 14 14  --  15 15  CREATININE 0.88 0.80  --  0.81 0.71  CALCIUM 8.2*  --   --  8.0* 8.0*  MG 1.8  --   --   --   --    Recent Labs  Lab 04/13/22 1707 04/13/22 2235 04/15/22 0248  AST 31 40 25  ALT _0 ALKPHOS 55 54 52  BILITOT 0.9 1.1 1.1  PROT 5.6* 5.9* 5.3*  ALBUMIN 2.4* 2.5* 2.1*   Recent Labs  Lab 04/13/22 1707 04/13/22 1730 04/13/22 1731 04/13/22 2235 04/15/22 0248  WBC 2.3*  --   --  3.0* 3.0*  NEUTROABS 1.1*  --   --   --   --   HGB 13.1 12.9 13.3 13.4 12.4  HCT 40.1 38.0 39.0 41.3 37.8  MCV 94.4  --   --  94.1 93.6  PLT 76*  --   --  77* 79*   No results for input(s): "CKTOTAL", "CKMB", "CKMBINDEX", "TROPONINI" in the last 168 hours. Recent Labs    04/13/22 1707  LABPROT 15.6*  INR 1.3*   Recent Labs    04/13/22 1651  COLORURINE YELLOW  LABSPEC 1.024  PHURINE 5.0  GLUCOSEU NEGATIVE  HGBUR SMALL*  BILIRUBINUR NEGATIVE  KETONESUR NEGATIVE  PROTEINUR 100*  NITRITE NEGATIVE  LEUKOCYTESUR NEGATIVE       Component Value Date/Time   CHOL 140 04/14/2022 0242   TRIG 70 04/14/2022 0242   HDL 29 (L) 04/14/2022 0242   CHOLHDL 4.8 04/14/2022 0242   VLDL 14 04/14/2022 0242   LDLCALC 97 04/14/2022 0242   Lab  Results  Component Value Date   HGBA1C 5.4 04/13/2022      Component Value Date/Time   LABOPIA NONE DETECTED 04/13/2022 1651   COCAINSCRNUR NONE DETECTED 04/13/2022 1651   LABBENZ NONE DETECTED 04/13/2022 1651   AMPHETMU NONE DETECTED 04/13/2022 1651   THCU NONE DETECTED 04/13/2022 1651   LABBARB NONE DETECTED 04/13/2022 1651    Recent Labs  Lab 04/13/22 1707 04/13/22 2235  ETH <10 <10    I have personally reviewed the radiological images below and agree with the radiology interpretations.  ECHOCARDIOGRAM COMPLETE BUBBLE STUDY  Result Date: 04/15/2022    ECHOCARDIOGRAM REPORT   Patient Name:   Whitney [name] Date of Exam: 04/14/2022 Medical Rec #:  [id number]      Height:       64.0 in Accession #:    [id number]     Weight:       216.0 lb Date of Birth:  [birth year]       BSA:  2.022 m Patient Age:    66 years       BP:           125/73 mmHg Patient Gender: F              HR:           96 bpm. Exam Location:  Inpatient Procedure: 2D Echo, Cardiac Doppler and Color Doppler Indications:    Stroke  History:        Patient has prior history of Echocardiogram examinations, most                 recent 12/26/2018. Aortic Valve Disease and Mitral Valve Disease.                 MAC. 21 mm Inspiris bovine pericardial valve in the aortic                 position. Procedure date 12/05/17.  Sonographer:    Clayton Lefort RDCS (AE) Referring Phys: [id number] [place]  1. Left ventricular ejection fraction, by estimation, is 60 to 65%. The left ventricle has normal function. The left ventricle has no regional wall motion abnormalities. There is mild concentric left ventricular hypertrophy. Left ventricular diastolic parameters are consistent with Grade II diastolic dysfunction (pseudonormalization). Elevated left ventricular end-diastolic pressure.  2. Right ventricular systolic function is normal. The right ventricular size is normal. There is moderately elevated pulmonary artery systolic  pressure.  3. Left atrial size was severely dilated.  4. Right atrial size was severely dilated.  5. Aortic dilatation noted. There is mild dilatation of the ascending aorta, measuring 41 mm.  6. The mitral valve is degenerative. Mild mitral valve regurgitation. Mild mitral stenosis. The mean mitral valve gradient is 4.0 mmHg. There is heavy calcification of the mitral annulus extending into the LV cavity and involving the submitral apparatus. There is caseous mitral annular calcification extending into the basal lateral LV wall.  7. The aortic valve has been repaired/replaced. There is a 21 mm Inspiris bovine pericardial valve present in the aortic position.     Perivalvular Aortic valve regurgitation is trivial. There is moderate calcification and thickening of the AV leaflets. Mild aortic stenosis is present. Aortic valve mean gradient measures 21.0 mmHg. Aortic valve peak gradient measures 37.9 mmHg. Aortic valve area, by VTI measures 1.91 cm.  8. The inferior vena cava is normal in size with greater than 50% respiratory variability, suggesting right atrial pressure of 3 mmHg.  9. Compared to study dated 12/26/2018, the is no significant change in mean AVG or DVI. FINDINGS  Left Ventricle: Left ventricular ejection fraction, by estimation, is 60 to 65%. The left ventricle has normal function. The left ventricle has no regional wall motion abnormalities. The left ventricular internal cavity size was normal in size. There is  mild concentric left ventricular hypertrophy. Left ventricular diastolic parameters are consistent with Grade II diastolic dysfunction (pseudonormalization). Elevated left ventricular end-diastolic pressure. Right Ventricle: The right ventricular size is normal. No increase in right ventricular wall thickness. Right ventricular systolic function is normal. There is moderately elevated pulmonary artery systolic pressure. The tricuspid regurgitant velocity is 3.40 m/s, and with an assumed right  atrial pressure of 8 mmHg, the estimated right ventricular systolic pressure is 11.9 mmHg. Left Atrium: Left atrial size was severely dilated. Right Atrium: Right atrial size was severely dilated. Pericardium: There is no evidence of pericardial effusion. Mitral Valve: There is caseous mitral annular calcification extending into  the basal lateral LV wall. The mitral valve is degenerative in appearance. There is severe thickening of the mitral valve leaflet(s). There is severe calcification of the mitral valve leaflet(s). Severe mitral annular calcification. Mild mitral valve regurgitation. Mild mitral valve stenosis. MV peak gradient, 6.5 mmHg. The mean mitral valve gradient is 4.0 mmHg. Tricuspid Valve: The tricuspid valve is normal in structure. Tricuspid valve regurgitation is mild . No evidence of tricuspid stenosis. Aortic Valve: The aortic valve has been repaired/replaced. There is moderate calcification of the aortic valve. There is moderate thickening of the aortic valve. Aortic valve regurgitation is trivial. Mild aortic stenosis is present. Aortic valve mean gradient measures 21.0 mmHg. Aortic valve peak gradient measures 37.9 mmHg. Aortic valve area, by VTI measures 1.91 cm. There is a 21 mm Inspiris bovine pericardial valve present in the aortic position. Pulmonic Valve: The pulmonic valve was normal in structure. Pulmonic valve regurgitation is trivial. No evidence of pulmonic stenosis. Aorta: Aortic dilatation noted. There is mild dilatation of the ascending aorta, measuring 41 mm. Venous: The inferior vena cava is normal in size with greater than 50% respiratory variability, suggesting right atrial pressure of 3 mmHg. IAS/Shunts: No atrial level shunt detected by color flow Doppler.  LEFT VENTRICLE PLAX 2D LVIDd:         4.60 cm   Diastology LVIDs:         3.30 cm   LV e' medial:    6.96 cm/s LV PW:         1.40 cm   LV E/e' medial:  18.7 LV IVS:        1.20 cm   LV e' lateral:   9.25 cm/s LVOT diam:      2.10 cm   LV E/e' lateral: 14.1 LV SV:         99 LV SV Index:   49 LVOT Area:     3.46 cm  RIGHT VENTRICLE             IVC RV Basal diam:  3.80 cm     IVC diam: 1.80 cm RV S prime:     16.60 cm/s TAPSE (M-mode): 2.5 cm LEFT ATRIUM              Index        RIGHT ATRIUM           Index LA diam:        3.70 cm  1.83 cm/m   RA Area:     21.40 cm LA Vol (A2C):   150.0 ml 74.20 ml/m  RA Volume:   75.40 ml  37.30 ml/m LA Vol (A4C):   108.0 ml 53.42 ml/m LA Biplane Vol: 131.0 ml 64.80 ml/m  AORTIC VALVE AV Area (Vmax):    1.73 cm AV Area (Vmean):   1.74 cm AV Area (VTI):     1.91 cm AV Vmax:           308.00 cm/s AV Vmean:          211.000 cm/s AV VTI:            0.517 m AV Peak Grad:      37.9 mmHg AV Mean Grad:      21.0 mmHg LVOT Vmax:         154.00 cm/s LVOT Vmean:        106.000 cm/s LVOT VTI:          0.285 m LVOT/AV VTI ratio: 0.55  AORTA Ao  Root diam: 3.10 cm Ao Asc diam:  4.10 cm MITRAL VALVE                TRICUSPID VALVE MV Area (PHT): 3.08 cm     TR Peak grad:   46.2 mmHg MV Area VTI:   2.79 cm     TR Vmax:        340.00 cm/s MV Peak grad:  6.5 mmHg MV Mean grad:  4.0 mmHg     SHUNTS MV Vmax:       1.27 m/s     Systemic VTI:  0.29 m MV Vmean:      102.0 cm/s   Systemic Diam: 2.10 cm MV Decel Time: 246 msec MV E velocity: 130.00 cm/s MV A velocity: 135.00 cm/s MV E/A ratio:  0.96 Fransico Him MD Electronically signed by Fransico Him MD Signature Date/Time: 04/15/2022/11:35:43 AM    Final    CT Chest High Resolution  Result Date: 04/14/2022 CLINICAL DATA:  Shortness of breath EXAM: CT CHEST WITHOUT CONTRAST TECHNIQUE: Multidetector CT imaging of the chest was performed following the standard protocol without intravenous contrast. High resolution imaging of the lungs, as well as inspiratory and expiratory imaging, was performed. RADIATION DOSE REDUCTION: This exam was performed according to the departmental dose-optimization program which includes automated exposure control, adjustment of the  mA and/or kV according to patient size and/or use of iterative reconstruction technique. COMPARISON:  CT abdomen pelvis, 05/15/2019 FINDINGS: Cardiovascular: Aortic atherosclerosis. Aortic valve prosthesis. Cardiomegaly. Left and right coronary artery calcifications. Dense mitral annulus calcifications. Enlargement of the main pulmonary artery measuring up to 3.8 cm in caliber. No pericardial effusion. Mediastinum/Nodes: No enlarged mediastinal, hilar, or axillary lymph nodes. Thyroid gland, trachea, and esophagus demonstrate no significant findings. Lungs/Pleura: Examination of the lungs is limited by breath motion artifact and the presence of pleural effusions. Trace bilateral pleural effusions and associated atelectasis or consolidation. Scarring and volume loss of the right lung base with elevation of the right hemidiaphragm, unchanged compared to prior examination. No specific evidence of fibrotic interstitial lung disease. Upper Abdomen: No acute abnormality. Cirrhosis and splenomegaly. Partially imaged sleeve gastrectomy. Musculoskeletal: No chest wall abnormality. No acute osseous findings. IMPRESSION: 1. Examination of the lungs is limited by breath motion artifact and the presence of pleural effusions. In general, interstitial lung disease protocol CT is of limited utility in the acute inpatient setting and should be deferred to outpatient evaluation. 2. Trace bilateral pleural effusions and associated atelectasis or consolidation. Bland appearing post infectious or inflammatory scarring and volume loss of the right lung base with elevation of the right hemidiaphragm, unchanged compared to the lung bases as included on prior examination of the abdomen. No specific evidence of fibrotic interstitial lung disease. 3. Cardiomegaly and coronary artery disease. 4. Enlargement of the main pulmonary artery, as can be seen in pulmonary hypertension. 5. Cirrhosis and splenomegaly. Aortic Atherosclerosis  (ICD10-I70.0). Electronically Signed   By: Delanna Ahmadi M.D.   On: 04/14/2022 12:47   CT ANGIO HEAD NECK W WO CM  Result Date: 04/14/2022 CLINICAL DATA:  66 year old female with double vision. Scattered punctate infarcts in the brainstem, cerebellum, left occipital lobe on MRI yesterday. EXAM: CT ANGIOGRAPHY HEAD AND NECK TECHNIQUE: Multidetector CT imaging of the head and neck was performed using the standard protocol during bolus administration of intravenous contrast. Multiplanar CT image reconstructions and MIPs were obtained to evaluate the vascular anatomy. Carotid stenosis measurements (when applicable) are obtained utilizing NASCET criteria, using the distal internal  carotid diameter as the denominator. RADIATION DOSE REDUCTION: This exam was performed according to the departmental dose-optimization program which includes automated exposure control, adjustment of the mA and/or kV according to patient size and/or use of iterative reconstruction technique. CONTRAST:  32m OMNIPAQUE IOHEXOL 350 MG/ML SOLN COMPARISON:  Head CT and brain MRI yesterday. FINDINGS: CT HEAD Brain: Multiple punctate ischemic foci detected in the brainstem, right cerebellum, and left occipital lobe remain occult by CT. No midline shift, ventriculomegaly, mass effect, evidence of mass lesion, intracranial hemorrhage or evidence of cortically based acute infarction. Calvarium and skull base: No acute osseous abnormality identified. Paranasal sinuses: Visualized paranasal sinuses and mastoids are stable and well aerated. Incidental left ethmoid osteoma (normal variant). Orbits: No acute orbit or scalp soft tissue finding. CTA NECK Skeleton: Prior sternotomy. Motion artifact in the cervical spine and at the mandible. No acute osseous abnormality identified. Upper chest: Bilateral upper lung mosaic attenuation. No superior mediastinal lymphadenopathy. Visible central pulmonary arteries are patent. Other neck: Bilateral salivary gland  atrophy and extensive parotid sialolithiasis or dystrophic calcifications. Motion artifact in the neck. No neck mass or lymphadenopathy is evident. Aortic arch: Calcified aortic atherosclerosis. Prior CABG. Three vessel arch configuration. Tortuous arch. Right carotid system: No significant brachiocephalic artery plaque or stenosis. Proximal right CCA obscured by dense subclavian venous contrast streak. But the visible right CCA is patent before the bifurcation with tortuosity but no plaque. Partially retropharyngeal course. Retropharyngeal right carotid bifurcation. Mild calcified plaque at the right ICA bulb without stenosis. Motion artifact but no evidence of right ICA stenosis to the skull base. Additional tortuosity below the skull base. Left carotid system: Mild left CCA origin calcified plaque without stenosis. Partially retropharyngeal course. Mild calcified plaque at the left ICA origin without stenosis. Motion artifact and tortuosity but no stenosis identified. Vertebral arteries: Proximal right subclavian artery appears normal. Right vertebral artery origin partially obscured by motion and streak artifact, not well evaluated. Right vertebral artery is mildly non dominant and tortuous. Motion artifact through the mid cervical spine. The right vertebral remains patent to the skull base, no obvious stenosis. Proximal left subclavian calcified plaque without stenosis. Normal left vertebral artery origin. Tortuous left V1 segment. Mildly dominant left vertebral artery with some of the V2 segment obscured by motion artifact but patent to the skull base with no obvious stenosis. CTA HEAD Posterior circulation: Vertebral V4 segments appear normal to the vertebrobasilar junction. The left is mildly dominant. Both PICA origins are patent. Patent basilar artery without stenosis. Mild motion artifact at the basilar tip. Fetal type bilateral PCA origins. SCA origins are patent. Bilateral PCA branch motion artifact. No  PCA occlusion or obvious stenosis. Anterior circulation: Both ICA siphons are patent. Mild motion artifact. Mild to moderate cavernous segment plaque bilaterally. Difficult to exclude moderate bilateral siphon stenosis but probably exaggerated from motion artifact. Both posterior communicating artery origins appear patent and normal. Patent carotid termini. Normal MCA and ACA origins. Normal anterior communicating artery. Bilateral ACA branches appear normal. Bilateral MCA M1 segments and bifurcations are patent without stenosis. Bilateral MCA branches are within normal limits. Venous sinuses: Early contrast timing, not well evaluated. Anatomic variants: Fetal type bilateral PCA origins. Mildly dominant left vertebral artery. Review of the MIP images confirms the above findings IMPRESSION: 1. Intermittently motion degraded exam. No large vessel occlusion, and generally mild for age atherosclerosis in the head and neck. No obvious hemodynamically significant arterial stenosis. 2. Note fetal type bilateral PCA origins, in conjunction with the pattern of  punctate infarcts involving the posterior fossa but also the left PCA territory, this may indicate a systemic embolic event from the heart or proximal aorta. 3. Punctate infarcts in #2 remain occult by CT. No new intracranial abnormality. 4. Prior CABG.  Aortic Atherosclerosis (ICD10-I70.0). 5. Generalized salivary gland atrophy with dystrophic calcifications or sialolithiasis. Electronically Signed   By: Genevie Ann M.D.   On: 04/14/2022 06:03   DG Chest 2 View  Result Date: 04/13/2022 CLINICAL DATA:  Short of breath EXAM: CHEST - 2 VIEW COMPARISON:  12/17/2018 FINDINGS: Frontal and lateral views of the chest demonstrate postsurgical changes from median sternotomy and aortic valve replacement. The cardiac silhouette is enlarged. There is central vascular congestion and mild diffuse interstitial prominence, without airspace disease, effusion, or pneumothorax. Chronic  elevation right hemidiaphragm. No acute bony abnormalities. IMPRESSION: 1. Findings consistent with congestive heart failure and mild interstitial edema. Electronically Signed   By: Randa Ngo M.D.   On: 04/13/2022 19:40   MR BRAIN WO CONTRAST  Result Date: 04/13/2022 CLINICAL DATA:  Double vision, syncope, unsteady on her feet EXAM: MRI HEAD WITHOUT CONTRAST TECHNIQUE: Multiplanar, multiecho pulse sequences of the brain and surrounding structures were obtained without intravenous contrast. COMPARISON:  12/17/2018 MRI head, correlation is also made with 04/13/2022 CT head FINDINGS: Brain: Punctate foci of restricted diffusion with ADC correlates in the right cerebellum (series 9, images 60 and 69), midbrain (series 9, images 75-77), left occipital lobe (series 9, images 82 and 84), and right parietal lobe (series 9, image 93). No acute hemorrhage, mass, mass effect, or midline shift. Punctate focus of hemosiderin deposition in the medial left parietal lobe, possibly sequela of prior microhemorrhage or tiny embolus. No hydrocephalus or extra-axial collection. Scattered T2 hyperintense signal in the periventricular white matter, likely the sequela of chronic small vessel ischemic disease. Partial empty sella. Normal craniocervical junction. Vascular: Normal arterial flow voids. Skull and upper cervical spine: Normal marrow signal. Sinuses/Orbits: Minimal mucosal thickening in the ethmoid air cells. The orbits are unremarkable. Other: The mastoids are well aerated. IMPRESSION: Punctate acute infarcts in the right cerebellum, midbrain, left occipital lobe, and right parietal lobe. Given multiple vascular territories, a central embolic etiology is suspected. These results were called by telephone at the time of interpretation on 04/13/2022 at 7:33 pm to provider MILLER, who verbally acknowledged these results. Electronically Signed   By: Merilyn Baba M.D.   On: 04/13/2022 19:33   CT HEAD WO CONTRAST  Result  Date: 04/13/2022 CLINICAL DATA:  Double vision, unsteady gait, tingling in extremities EXAM: CT HEAD WITHOUT CONTRAST TECHNIQUE: Contiguous axial images were obtained from the base of the skull through the vertex without intravenous contrast. RADIATION DOSE REDUCTION: This exam was performed according to the departmental dose-optimization program which includes automated exposure control, adjustment of the mA and/or kV according to patient size and/or use of iterative reconstruction technique. COMPARISON:  12/17/2018 FINDINGS: Brain: No acute infarct or hemorrhage. Lateral ventricles and midline structures are unremarkable. No acute extra-axial fluid collections. No mass effect. Vascular: No hyperdense vessel or unexpected calcification. Skull: Normal. Negative for fracture or focal lesion. Sinuses/Orbits: No acute finding. Stable osteoma left ethmoid air cell. Other: None. IMPRESSION: 1. No acute intracranial process. Electronically Signed   By: Randa Ngo M.D.   On: 04/13/2022 17:43     PHYSICAL EXAM General: well-appearing and in no acute distress HEENT: normocephalic and atraumatic Cardiovascular: regular rate Respiratory: normal respiratory effort and on RA Gastrointestinal: non-tender and non-distended Extremities: moving all  extremities spontaneously  Mental Status: Whitney [name] is alert; she is oriented to person, place, time, and situation. Speech was clear and fluent without evidence of aphasia. She was able to follow 3 step commands without difficulty.  Cranial Nerves: II:  Visual fields grossly normal III,IV, VI: no ptosis, extra-ocular motions intact bilaterally V,VII: smile symmetric VIII: hearing grossly normal  Motor: Right : Upper extremity   5/5 full power  Lower extremity   5/5 full power  Left: Upper extremity   5/5 full power Lower extremity   5/5 full power  Tone and bulk: normal tone throughout; no atrophy noted  Gait: normal  ASSESSMENT/PLAN Whitney [name] is a 66 y.o. female with history of diabetes, cirrhosis, Sjogren's disease, OSA, remote rheumatic fever with AVR admitted for episode of imbalance, diplopia, nausea and numbness. No tPA given due to outside window.    Stroke: Scattered punctate infarcts at right cerebellum, midbrain, left occipital and right parietal, etiology unclear, cardioembolic versus endocarditis CT no acute abnormality CTA head and neck bilateral fetal PCAs MRI right cerebellum, midbrain, left occipital, right parietal punctate infarcts 2D Echo EF 60-65% LE venous doppler negative for DVT 10/2020 30-day heart monitoring showed short runs of PACs Plan for loop recorder today prior to discharge LDL 97 HgbA1c 5.4 Heparin subcu for VTE prophylaxis No antithrombotic prior to admission, now on aspirin 81 mg daily and plavix 75 DAPT for 3 weeks and then aspirin 81 mg daily alone indefinitely Patient counseled to be compliant with her antithrombotic medications Ongoing aggressive stroke risk factor management Therapy recommendations: Home health PT Disposition: home today  Fever Tmax 102.9->afebrile  WBC 2.3->3.0->3.0 Blood culture NGTD 2D echo no evidence of endocarditis  Arrhythmia  Post op afib post AVR but not persistent No AC at home 30 day monitoring as outpt showed short runs of PACs EP consulted and plan for loop today  Diabetes HgbA1c 5.4 goal < 7.0 Controlled CBG monitoring SSI DM education and close PCP follow up  Hypertension Stable Long term BP goal normotensive  Hyperlipidemia Home meds: Zetia LDL 97, goal < 70 Now on Zetia Not a candidate for statin due to cirrhosis Continue Zetia at discharge  Other Stroke Risk Factors Advanced age ETOH use, recommend no more than 1 drink per day Obesity, Body mass index is 37.09 kg/m.  Obstructive sleep apnea  Other Active Problems Sjogren's disease Cirrhosis Thrombocytopenia, platelet 79  Hospital day # 3   ATTENDING NOTE: I  reviewed above note and agree with the assessment and plan. Pt was seen and examined.   Pt just had LE venous doppler, no DVT. She is ambulating in room without difficulty. EP reported post AVR short afib but outpt 30 day showed no afib but short runs of PACs. Plan for loop recorder prior to dc. Will continue DAPT for 3 weeks and than ASA alone. Noted thrombocytopenia but platelet stable at 79. Continue zetia. PT/OT recommend HH.   For detailed assessment and plan, please refer to above/below as I have made changes wherever appropriate.   Neurology will sign off. Please call with questions. Pt will follow up with stroke clinic NP at [facility] in about 4 weeks. Thanks for the consult.   Rosalin Hawking, MD PhD Stroke Neurology 04/16/2022 1:17 PM    To contact Stroke Continuity provider, please refer to [email or web address] After hours, contact General Neurology

## 2022-04-16 NOTE — Progress Notes (Signed)
   04/16/22 1040  Clinical Encounter Type  Visited With Patient  Visit Type Initial;Spiritual support  Referral From Nurse  Consult/Referral To Chaplain   Chaplain responded to a spiritual consult for prayer. The patient, Whitney [name] was preparing to go for a procedure. We prayed for a few minutes for her procedure and I assured her I would keep her in prayer.  [name] is very hopeful that she is going home today stating the procedure will be key in that decision. I wished her well and continued healing.   [name] [facility]  [phone]

## 2022-04-16 NOTE — Discharge Summary (Signed)
[facility]  Physician Discharge Summary   Patient ID: Whitney [name] MRN: [id number] DOB/AGE: 12/03/1955 66 y.o.  Admit date: 04/13/2022 Discharge date:   04/16/2022   PCP: Lawerance Cruel, MD  DISCHARGE DIAGNOSES:    Acute CVA (cerebrovascular accident) [facility]) Fever of unknown origin, resolved History of asthma Liver cirrhosis secondary to NASH Chronic leukopenia and thrombocytopenia Diabetes mellitus type 2 History of aortic valve replacement History of Sjogren's syndrome History of sleep apnea   RECOMMENDATIONS FOR OUTPATIENT FOLLOW UP: Ambulatory referral sent to neurology Will send message to her primary cardiologist for follow-up   Home Health: Health PT Equipment/Devices: None  CODE STATUS: Full code  DISCHARGE CONDITION: fair  Diet recommendation: Heart healthy  INITIAL HISTORY: 66 y.o. female with medical history significant of  Anemia, Asthma, Cirrhosis,DMII, GERD, Depression, fibromyalgia, hx of heart murmur (due to rheumatic fever 1989)  s/p aortic valve replacement 2019, Sjogren's syndrome, hx of OSA on cpap patient presented to the ED with complaint of double vision, unsteady gate, feeling of disequilibrium as well as paresthesias in b/l fingers and mouth.     Consultants: Neurology   Procedures: Transthoracic echocardiogram.  Loop recorder to be placed prior to discharge.    HOSPITAL COURSE:   Acute stroke Patient's symptoms included double vision disequilibrium and paresthesias of hands. Multiple areas of infarct noted on MRI.  Concern for cardioembolic etiology. Neurology has been consulted. Patient underwent MRI brain and CT angiogram head and neck. Echocardiogram shows normal systolic function.  Calcific mitral and aortic valve noted. LDL is 97.  HbA1c is 5.4. Patient noted to be on Zetia.  Due to her history of NASH she does not appear to be a candidate for statin. Neurology recommends aspirin and Plavix for 3 weeks followed by  aspirin alone. Lower extremity Doppler negative for DVT.  Loop recorder to be placed prior to discharge. PT OT SLP following.  Home health recommended.     Fever No clear source of infection identified.  Fever appears to have subsided  Noted to have leukopenia which is chronic for her.  No obvious source of infection identified.  UA was noted to be unremarkable.  Patient does not have any diarrhea.   COVID-19 RSV and influenza PCR's were negative.  Respiratory viral panel was negative.  Strep screen was negative.  Procalcitonin noted to be less than 0.1 and remains low this morning. Blood cultures without any growth after 48 hours.  Echocardiogram does not show endocarditis.  Fever has resolved.  Procalcitonin remains less than 0.1.  Could have had a transient viral syndrome.   Elevated D-dimer Low suspicion for venous thromboembolism.  Elevated D-dimer is likely due to her history of liver cirrhosis.  Lower extremity Dopplers negative for DVT   Abnormal chest x-ray Chest x-ray suggested pulmonary edema.  BNP is only 293.   Patient does not have any respiratory symptoms.  CT chest was limited study.  Trace bilateral pleural effusions and atelectasis noted.  Patient has elevation of the right hemidiaphragm which is chronic for her.  No clear infiltrates identified. Patient with minimal respiratory symptoms.  Saturating normal on room air.  Echocardiogram shows normal systolic function.  Does not have any respiratory symptoms currently.  Outpatient management.    History of asthma Stable.  No wheezing appreciated.   History of liver cirrhosis secondary to NASH Followed at [facility]  Has associated leukopenia and thrombocytopenia.  LFTs are stable. Nadolol can be resumed at discharge.   Leukopenia/thrombocytopenia Presumably related to  NASH.  Followed at [facility].   Diabetes mellitus type 2 HbA1c is 5.4.  SSI.   GERD PPI   History of rheumatic fever/aortic valve replacement in 2019 Followed  by Dr. Irish Lack with cardiology.   Echocardiogram shows normal systolic function with grade 2 diastolic dysfunction.  Calcified mitral and aortic valves noted.  Recommend outpatient follow-up with cardiology.   History of Sjogren's syndrome Stable   History of sleep apnea CPAP   Obesity Estimated body mass index is 37.09 kg/m as calculated from the following:   Height as of this encounter: _0  (1.626 m).   Weight as of this encounter: 98 kg.   Patient is stable.  Okay for discharge home today after loop recorder has been placed.   PERTINENT LABS:  The results of significant diagnostics from this hospitalization (including imaging, microbiology, ancillary and laboratory) are listed below for reference.    Microbiology: Recent Results (from the past 240 hour(s))  Culture, OB Urine     Status: Abnormal   Collection Time: 04/13/22  4:51 PM   Specimen: Urine, Clean Catch  Result Value Ref Range Status   Specimen Description URINE, CLEAN CATCH  Final   Special Requests NONE  Final   Culture (A)  Final    MULTIPLE SPECIES PRESENT, SUGGEST RECOLLECTION NO GROUP B STREP (S.AGALACTIAE) ISOLATED Performed at [facility], [address] [place], [place] [address]    Report Status 04/15/2022 FINAL  Final  Resp panel by RT-PCR (RSV, Flu A&B, Covid) Anterior Nasal Swab     Status: None   Collection Time: 04/13/22  6:07 PM   Specimen: Anterior Nasal Swab  Result Value Ref Range Status   SARS Coronavirus 2 by RT PCR NEGATIVE NEGATIVE Final    Comment: (NOTE) SARS-CoV-2 target nucleic acids are NOT DETECTED.  The SARS-CoV-2 RNA is generally detectable in upper respiratory specimens during the acute phase of infection. The lowest concentration of SARS-CoV-2 viral copies this assay can detect is 138 copies/mL. A negative result does not preclude SARS-Cov-2 infection and should not be used as the sole basis for treatment or other patient management decisions. A negative result  may occur with  improper specimen collection/handling, submission of specimen other than nasopharyngeal swab, presence of viral mutation(s) within the areas targeted by this assay, and inadequate number of viral copies(<138 copies/mL). A negative result must be combined with clinical observations, patient history, and epidemiological information. The expected result is Negative.  Fact Sheet for Patients:  EntrepreneurPulse.com.au  Fact Sheet for Healthcare Providers:  IncredibleEmployment.be  This test is no t yet approved or cleared by the [place] FDA and  has been authorized for detection and/or diagnosis of SARS-CoV-2 by FDA under an Emergency Use Authorization (EUA). This EUA will remain  in effect (meaning this test can be used) for the duration of the COVID-19 declaration under Section 564(b)(1) of the Act, 21 U.S.C.section 360bbb-3(b)(1), unless the authorization is terminated  or revoked sooner.       Influenza A by PCR NEGATIVE NEGATIVE Final   Influenza B by PCR NEGATIVE NEGATIVE Final    Comment: (NOTE) The Xpert Xpress SARS-CoV-2/FLU/RSV plus assay is intended as an aid in the diagnosis of influenza from Nasopharyngeal swab specimens and should not be used as a sole basis for treatment. Nasal washings and aspirates are unacceptable for Xpert Xpress SARS-CoV-2/FLU/RSV testing.  Fact Sheet for Patients: EntrepreneurPulse.com.au  Fact Sheet for Healthcare Providers: IncredibleEmployment.be  This test is not yet approved or cleared  by the [place] and has been authorized for detection and/or diagnosis of SARS-CoV-2 by FDA under an Emergency Use Authorization (EUA). This EUA will remain in effect (meaning this test can be used) for the duration of the COVID-19 declaration under Section 564(b)(1) of the Act, 21 U.S.C. section 360bbb-3(b)(1), unless the authorization is terminated  or revoked.     Resp Syncytial Virus by PCR NEGATIVE NEGATIVE Final    Comment: (NOTE) Fact Sheet for Patients: EntrepreneurPulse.com.au  Fact Sheet for Healthcare Providers: IncredibleEmployment.be  This test is not yet approved or cleared by the [place] FDA and has been authorized for detection and/or diagnosis of SARS-CoV-2 by FDA under an Emergency Use Authorization (EUA). This EUA will remain in effect (meaning this test can be used) for the duration of the COVID-19 declaration under Section 564(b)(1) of the Act, 21 U.S.C. section 360bbb-3(b)(1), unless the authorization is terminated or revoked.  Performed at [facility], [place] [address] [place], [place] [address]   Culture, blood (Routine X 2) w Reflex to ID Panel     Status: None (Preliminary result)   Collection Time: 04/13/22  9:02 PM   Specimen: BLOOD  Result Value Ref Range Status   Specimen Description BLOOD SITE NOT SPECIFIED  Final   Special Requests   Final    BOTTLES DRAWN AEROBIC AND ANAEROBIC Blood Culture results may not be optimal due to an inadequate volume of blood received in culture bottles   Culture   Final    NO GROWTH 3 DAYS Performed at [facility], [place] [address], [place], [place] [address]    Report Status PENDING  Incomplete  Culture, blood (Routine X 2) w Reflex to ID Panel     Status: None (Preliminary result)   Collection Time: 04/14/22  2:36 AM   Specimen: BLOOD  Result Value Ref Range Status   Specimen Description BLOOD BLOOD LEFT HAND  Final   Special Requests   Final    BOTTLES DRAWN AEROBIC AND ANAEROBIC Blood Culture adequate volume   Culture   Final    NO GROWTH 2 DAYS Performed at [facility], [place] [address] [place], [place] [address]    Report Status PENDING  Incomplete  Respiratory (~20 pathogens) panel by PCR     Status: None   Collection Time: 04/14/22 10:55 AM   Specimen: Nasopharyngeal Swab; Respiratory   Result Value Ref Range Status   Adenovirus NOT DETECTED NOT DETECTED Final   Coronavirus 229E NOT DETECTED NOT DETECTED Final    Comment: (NOTE) The Coronavirus on the Respiratory Panel, DOES NOT test for the novel  Coronavirus (2019 nCoV)    Coronavirus HKU1 NOT DETECTED NOT DETECTED Final   Coronavirus NL63 NOT DETECTED NOT DETECTED Final   Coronavirus OC43 NOT DETECTED NOT DETECTED Final   Metapneumovirus NOT DETECTED NOT DETECTED Final   Rhinovirus / Enterovirus NOT DETECTED NOT DETECTED Final   Influenza A NOT DETECTED NOT DETECTED Final   Influenza B NOT DETECTED NOT DETECTED Final   Parainfluenza Virus 1 NOT DETECTED NOT DETECTED Final   Parainfluenza Virus 2 NOT DETECTED NOT DETECTED Final   Parainfluenza Virus 3 NOT DETECTED NOT DETECTED Final   Parainfluenza Virus 4 NOT DETECTED NOT DETECTED Final   Respiratory Syncytial Virus NOT DETECTED NOT DETECTED Final   Bordetella pertussis NOT DETECTED NOT DETECTED Final   Bordetella Parapertussis NOT DETECTED NOT DETECTED Final   Chlamydophila pneumoniae NOT DETECTED NOT DETECTED Final  Mycoplasma pneumoniae NOT DETECTED NOT DETECTED Final    Comment: Performed at [facility], [place] [name], [place], [place] [address]  Group A Strep by PCR     Status: None   Collection Time: 04/14/22 10:56 AM   Specimen: Throat; Sterile Swab  Result Value Ref Range Status   Group A Strep by PCR NOT DETECTED NOT DETECTED Final    Comment: Performed at [facility], [place] [name], [place], [place] [address]     Labs:   Basic Metabolic Panel: Recent Labs  Lab 04/13/22 1707 04/13/22 1730 04/13/22 1731 04/13/22 2235 04/15/22 0248  NA 139 141 141 138 136  K 3.9 3.7 3.7 4.4 3.7  CL 104 102  --  105 104  CO2 28  --   --  24 25  GLUCOSE 122* 116*  --  83 110*  BUN 14 14  --  15 15  CREATININE 0.88 0.80  --  0.81 0.71  CALCIUM 8.2*  --   --  8.0* 8.0*  MG 1.8  --   --   --   --    Liver Function Tests: Recent Labs  Lab  04/13/22 1707 04/13/22 2235 04/15/22 0248  AST 31 40 25  ALT _0 ALKPHOS 55 54 52  BILITOT 0.9 1.1 1.1  PROT 5.6* 5.9* 5.3*  ALBUMIN 2.4* 2.5* 2.1*    CBC: Recent Labs  Lab 04/13/22 1707 04/13/22 1730 04/13/22 1731 04/13/22 2235 04/15/22 0248  WBC 2.3*  --   --  3.0* 3.0*  NEUTROABS 1.1*  --   --   --   --   HGB 13.1 12.9 13.3 13.4 12.4  HCT 40.1 38.0 39.0 41.3 37.8  MCV 94.4  --   --  94.1 93.6  PLT 76*  --   --  77* 79*    BNP: BNP (last 3 results) Recent Labs    04/14/22 0242  BNP 293.6*    CBG: Recent Labs  Lab 04/14/22 0041  GLUCAP 95     IMAGING STUDIES VAS [place] LOWER EXTREMITY VENOUS (DVT)  Result Date: 04/16/2022  Lower Venous DVT Study Patient Name:  MIRIANA GAERTNER Terrio  Date of Exam:   04/16/2022 Medical Rec #: [id number]       Accession #:    [id number] Date of Birth: Jan 01, 1956        Patient Gender: F Patient Age:   19 years Exam Location:  [facility] Procedure:      VAS [place] LOWER EXTREMITY VENOUS (DVT) Referring Phys: Bonnielee Haff --------------------------------------------------------------------------------  Indications: Stroke, and Fever.  Limitations: Interstitial edema. Comparison Study: Prior negative right LEV done 06/15/13 Performing Technologist: Sharion Dove RVS  Examination Guidelines: A complete evaluation includes B-mode imaging, spectral Doppler, color Doppler, and power Doppler as needed of all accessible portions of each vessel. Bilateral testing is considered an integral part of a complete examination. Limited examinations for reoccurring indications may be performed as noted. The reflux portion of the exam is performed with the patient in reverse Trendelenburg.  +---------+---------------+---------+-----------+----------+--------------+ RIGHT    CompressibilityPhasicitySpontaneityPropertiesThrombus Aging +---------+---------------+---------+-----------+----------+--------------+ CFV      Full           Yes      Yes                                  +---------+---------------+---------+-----------+----------+--------------+ SFJ  Full                                                        +---------+---------------+---------+-----------+----------+--------------+ FV Prox  Full                                                        +---------+---------------+---------+-----------+----------+--------------+ FV Mid   Full                                                        +---------+---------------+---------+-----------+----------+--------------+ FV DistalFull                                                        +---------+---------------+---------+-----------+----------+--------------+ PFV      Full                                                        +---------+---------------+---------+-----------+----------+--------------+ POP      Full           Yes      Yes                                 +---------+---------------+---------+-----------+----------+--------------+ PTV      Full                                                        +---------+---------------+---------+-----------+----------+--------------+ PERO     Full                                                        +---------+---------------+---------+-----------+----------+--------------+   +---------+---------------+---------+-----------+----------+--------------+ LEFT     CompressibilityPhasicitySpontaneityPropertiesThrombus Aging +---------+---------------+---------+-----------+----------+--------------+ CFV      Full           Yes      Yes                                 +---------+---------------+---------+-----------+----------+--------------+ SFJ      Full                                                        +---------+---------------+---------+-----------+----------+--------------+  FV Prox  Full                                                         +---------+---------------+---------+-----------+----------+--------------+ FV Mid   Full                                                        +---------+---------------+---------+-----------+----------+--------------+ FV DistalFull                                                        +---------+---------------+---------+-----------+----------+--------------+ PFV      Full                                                        +---------+---------------+---------+-----------+----------+--------------+ POP      Full           Yes      Yes                                 +---------+---------------+---------+-----------+----------+--------------+ PTV      Full                                                        +---------+---------------+---------+-----------+----------+--------------+ PERO     Full                                                        +---------+---------------+---------+-----------+----------+--------------+     Summary: BILATERAL: - No evidence of deep vein thrombosis seen in the lower extremities, bilaterally. -No evidence of popliteal cyst, bilaterally.   *See table(s) above for measurements and observations.    Preliminary    ECHOCARDIOGRAM COMPLETE BUBBLE STUDY  Result Date: 04/15/2022    ECHOCARDIOGRAM REPORT   Patient Name:   HANEEFAH VENTURINI Lacuesta Date of Exam: 04/14/2022 Medical Rec #:  [id number]      Height:       64.0 in Accession #:    [id number]     Weight:       216.0 lb Date of Birth:  February 10, 1956       BSA:          2.022 m Patient Age:    64 years       BP:           125/73 mmHg Patient Gender: F              HR:  96 bpm. Exam Location:  Inpatient Procedure: 2D Echo, Cardiac Doppler and Color Doppler Indications:    Stroke  History:        Patient has prior history of Echocardiogram examinations, most                 recent 12/26/2018. Aortic Valve Disease and Mitral Valve Disease.                 MAC. 21 mm Inspiris bovine  pericardial valve in the aortic                 position. Procedure date 12/05/17.  Sonographer:    Clayton Lefort RDCS (AE) Referring Phys: [id number] [place]  1. Left ventricular ejection fraction, by estimation, is 60 to 65%. The left ventricle has normal function. The left ventricle has no regional wall motion abnormalities. There is mild concentric left ventricular hypertrophy. Left ventricular diastolic parameters are consistent with Grade II diastolic dysfunction (pseudonormalization). Elevated left ventricular end-diastolic pressure.  2. Right ventricular systolic function is normal. The right ventricular size is normal. There is moderately elevated pulmonary artery systolic pressure.  3. Left atrial size was severely dilated.  4. Right atrial size was severely dilated.  5. Aortic dilatation noted. There is mild dilatation of the ascending aorta, measuring 41 mm.  6. The mitral valve is degenerative. Mild mitral valve regurgitation. Mild mitral stenosis. The mean mitral valve gradient is 4.0 mmHg. There is heavy calcification of the mitral annulus extending into the LV cavity and involving the submitral apparatus. There is caseous mitral annular calcification extending into the basal lateral LV wall.  7. The aortic valve has been repaired/replaced. There is a 21 mm Inspiris bovine pericardial valve present in the aortic position.     Perivalvular Aortic valve regurgitation is trivial. There is moderate calcification and thickening of the AV leaflets. Mild aortic stenosis is present. Aortic valve mean gradient measures 21.0 mmHg. Aortic valve peak gradient measures 37.9 mmHg. Aortic valve area, by VTI measures 1.91 cm.  8. The inferior vena cava is normal in size with greater than 50% respiratory variability, suggesting right atrial pressure of 3 mmHg.  9. Compared to study dated 12/26/2018, the is no significant change in mean AVG or DVI. FINDINGS  Left Ventricle: Left ventricular ejection  fraction, by estimation, is 60 to 65%. The left ventricle has normal function. The left ventricle has no regional wall motion abnormalities. The left ventricular internal cavity size was normal in size. There is  mild concentric left ventricular hypertrophy. Left ventricular diastolic parameters are consistent with Grade II diastolic dysfunction (pseudonormalization). Elevated left ventricular end-diastolic pressure. Right Ventricle: The right ventricular size is normal. No increase in right ventricular wall thickness. Right ventricular systolic function is normal. There is moderately elevated pulmonary artery systolic pressure. The tricuspid regurgitant velocity is 3.40 m/s, and with an assumed right atrial pressure of 8 mmHg, the estimated right ventricular systolic pressure is 48.2 mmHg. Left Atrium: Left atrial size was severely dilated. Right Atrium: Right atrial size was severely dilated. Pericardium: There is no evidence of pericardial effusion. Mitral Valve: There is caseous mitral annular calcification extending into the basal lateral LV wall. The mitral valve is degenerative in appearance. There is severe thickening of the mitral valve leaflet(s). There is severe calcification of the mitral valve leaflet(s). Severe mitral annular calcification. Mild mitral valve regurgitation. Mild mitral valve stenosis. MV peak gradient, 6.5 mmHg. The mean mitral valve gradient is 4.0 mmHg.  Tricuspid Valve: The tricuspid valve is normal in structure. Tricuspid valve regurgitation is mild . No evidence of tricuspid stenosis. Aortic Valve: The aortic valve has been repaired/replaced. There is moderate calcification of the aortic valve. There is moderate thickening of the aortic valve. Aortic valve regurgitation is trivial. Mild aortic stenosis is present. Aortic valve mean gradient measures 21.0 mmHg. Aortic valve peak gradient measures 37.9 mmHg. Aortic valve area, by VTI measures 1.91 cm. There is a 21 mm Inspiris bovine  pericardial valve present in the aortic position. Pulmonic Valve: The pulmonic valve was normal in structure. Pulmonic valve regurgitation is trivial. No evidence of pulmonic stenosis. Aorta: Aortic dilatation noted. There is mild dilatation of the ascending aorta, measuring 41 mm. Venous: The inferior vena cava is normal in size with greater than 50% respiratory variability, suggesting right atrial pressure of 3 mmHg. IAS/Shunts: No atrial level shunt detected by color flow Doppler.  LEFT VENTRICLE PLAX 2D LVIDd:         4.60 cm   Diastology LVIDs:         3.30 cm   LV e' medial:    6.96 cm/s LV PW:         1.40 cm   LV E/e' medial:  18.7 LV IVS:        1.20 cm   LV e' lateral:   9.25 cm/s LVOT diam:     2.10 cm   LV E/e' lateral: 14.1 LV SV:         99 LV SV Index:   49 LVOT Area:     3.46 cm  RIGHT VENTRICLE             IVC RV Basal diam:  3.80 cm     IVC diam: 1.80 cm RV S prime:     16.60 cm/s TAPSE (M-mode): 2.5 cm LEFT ATRIUM              Index        RIGHT ATRIUM           Index LA diam:        3.70 cm  1.83 cm/m   RA Area:     21.40 cm LA Vol (A2C):   150.0 ml 74.20 ml/m  RA Volume:   75.40 ml  37.30 ml/m LA Vol (A4C):   108.0 ml 53.42 ml/m LA Biplane Vol: 131.0 ml 64.80 ml/m  AORTIC VALVE AV Area (Vmax):    1.73 cm AV Area (Vmean):   1.74 cm AV Area (VTI):     1.91 cm AV Vmax:           308.00 cm/s AV Vmean:          211.000 cm/s AV VTI:            0.517 m AV Peak Grad:      37.9 mmHg AV Mean Grad:      21.0 mmHg LVOT Vmax:         154.00 cm/s LVOT Vmean:        106.000 cm/s LVOT VTI:          0.285 m LVOT/AV VTI ratio: 0.55  AORTA Ao Root diam: 3.10 cm Ao Asc diam:  4.10 cm MITRAL VALVE                TRICUSPID VALVE MV Area (PHT): 3.08 cm     TR Peak grad:   46.2 mmHg MV Area VTI:   2.79 cm  TR Vmax:        340.00 cm/s MV Peak grad:  6.5 mmHg MV Mean grad:  4.0 mmHg     SHUNTS MV Vmax:       1.27 m/s     Systemic VTI:  0.29 m MV Vmean:      102.0 cm/s   Systemic Diam: 2.10 cm MV Decel  Time: 246 msec MV E velocity: 130.00 cm/s MV A velocity: 135.00 cm/s MV E/A ratio:  0.96 Fransico Him MD Electronically signed by Fransico Him MD Signature Date/Time: 04/15/2022/11:35:43 AM    Final    CT Chest High Resolution  Result Date: 04/14/2022 CLINICAL DATA:  Shortness of breath EXAM: CT CHEST WITHOUT CONTRAST TECHNIQUE: Multidetector CT imaging of the chest was performed following the standard protocol without intravenous contrast. High resolution imaging of the lungs, as well as inspiratory and expiratory imaging, was performed. RADIATION DOSE REDUCTION: This exam was performed according to the departmental dose-optimization program which includes automated exposure control, adjustment of the mA and/or kV according to patient size and/or use of iterative reconstruction technique. COMPARISON:  CT abdomen pelvis, 05/15/2019 FINDINGS: Cardiovascular: Aortic atherosclerosis. Aortic valve prosthesis. Cardiomegaly. Left and right coronary artery calcifications. Dense mitral annulus calcifications. Enlargement of the main pulmonary artery measuring up to 3.8 cm in caliber. No pericardial effusion. Mediastinum/Nodes: No enlarged mediastinal, hilar, or axillary lymph nodes. Thyroid gland, trachea, and esophagus demonstrate no significant findings. Lungs/Pleura: Examination of the lungs is limited by breath motion artifact and the presence of pleural effusions. Trace bilateral pleural effusions and associated atelectasis or consolidation. Scarring and volume loss of the right lung base with elevation of the right hemidiaphragm, unchanged compared to prior examination. No specific evidence of fibrotic interstitial lung disease. Upper Abdomen: No acute abnormality. Cirrhosis and splenomegaly. Partially imaged sleeve gastrectomy. Musculoskeletal: No chest wall abnormality. No acute osseous findings. IMPRESSION: 1. Examination of the lungs is limited by breath motion artifact and the presence of pleural effusions.  In general, interstitial lung disease protocol CT is of limited utility in the acute inpatient setting and should be deferred to outpatient evaluation. 2. Trace bilateral pleural effusions and associated atelectasis or consolidation. Bland appearing post infectious or inflammatory scarring and volume loss of the right lung base with elevation of the right hemidiaphragm, unchanged compared to the lung bases as included on prior examination of the abdomen. No specific evidence of fibrotic interstitial lung disease. 3. Cardiomegaly and coronary artery disease. 4. Enlargement of the main pulmonary artery, as can be seen in pulmonary hypertension. 5. Cirrhosis and splenomegaly. Aortic Atherosclerosis (ICD10-I70.0). Electronically Signed   By: Delanna Ahmadi M.D.   On: 04/14/2022 12:47   CT ANGIO HEAD NECK W WO CM  Result Date: 04/14/2022 CLINICAL DATA:  66 year old female with double vision. Scattered punctate infarcts in the brainstem, cerebellum, left occipital lobe on MRI yesterday. EXAM: CT ANGIOGRAPHY HEAD AND NECK TECHNIQUE: Multidetector CT imaging of the head and neck was performed using the standard protocol during bolus administration of intravenous contrast. Multiplanar CT image reconstructions and MIPs were obtained to evaluate the vascular anatomy. Carotid stenosis measurements (when applicable) are obtained utilizing NASCET criteria, using the distal internal carotid diameter as the denominator. RADIATION DOSE REDUCTION: This exam was performed according to the departmental dose-optimization program which includes automated exposure control, adjustment of the mA and/or kV according to patient size and/or use of iterative reconstruction technique. CONTRAST:  93m OMNIPAQUE IOHEXOL 350 MG/ML SOLN COMPARISON:  Head CT and brain MRI yesterday. FINDINGS:  CT HEAD Brain: Multiple punctate ischemic foci detected in the brainstem, right cerebellum, and left occipital lobe remain occult by CT. No midline shift,  ventriculomegaly, mass effect, evidence of mass lesion, intracranial hemorrhage or evidence of cortically based acute infarction. Calvarium and skull base: No acute osseous abnormality identified. Paranasal sinuses: Visualized paranasal sinuses and mastoids are stable and well aerated. Incidental left ethmoid osteoma (normal variant). Orbits: No acute orbit or scalp soft tissue finding. CTA NECK Skeleton: Prior sternotomy. Motion artifact in the cervical spine and at the mandible. No acute osseous abnormality identified. Upper chest: Bilateral upper lung mosaic attenuation. No superior mediastinal lymphadenopathy. Visible central pulmonary arteries are patent. Other neck: Bilateral salivary gland atrophy and extensive parotid sialolithiasis or dystrophic calcifications. Motion artifact in the neck. No neck mass or lymphadenopathy is evident. Aortic arch: Calcified aortic atherosclerosis. Prior CABG. Three vessel arch configuration. Tortuous arch. Right carotid system: No significant brachiocephalic artery plaque or stenosis. Proximal right CCA obscured by dense subclavian venous contrast streak. But the visible right CCA is patent before the bifurcation with tortuosity but no plaque. Partially retropharyngeal course. Retropharyngeal right carotid bifurcation. Mild calcified plaque at the right ICA bulb without stenosis. Motion artifact but no evidence of right ICA stenosis to the skull base. Additional tortuosity below the skull base. Left carotid system: Mild left CCA origin calcified plaque without stenosis. Partially retropharyngeal course. Mild calcified plaque at the left ICA origin without stenosis. Motion artifact and tortuosity but no stenosis identified. Vertebral arteries: Proximal right subclavian artery appears normal. Right vertebral artery origin partially obscured by motion and streak artifact, not well evaluated. Right vertebral artery is mildly non dominant and tortuous. Motion artifact through the  mid cervical spine. The right vertebral remains patent to the skull base, no obvious stenosis. Proximal left subclavian calcified plaque without stenosis. Normal left vertebral artery origin. Tortuous left V1 segment. Mildly dominant left vertebral artery with some of the V2 segment obscured by motion artifact but patent to the skull base with no obvious stenosis. CTA HEAD Posterior circulation: Vertebral V4 segments appear normal to the vertebrobasilar junction. The left is mildly dominant. Both PICA origins are patent. Patent basilar artery without stenosis. Mild motion artifact at the basilar tip. Fetal type bilateral PCA origins. SCA origins are patent. Bilateral PCA branch motion artifact. No PCA occlusion or obvious stenosis. Anterior circulation: Both ICA siphons are patent. Mild motion artifact. Mild to moderate cavernous segment plaque bilaterally. Difficult to exclude moderate bilateral siphon stenosis but probably exaggerated from motion artifact. Both posterior communicating artery origins appear patent and normal. Patent carotid termini. Normal MCA and ACA origins. Normal anterior communicating artery. Bilateral ACA branches appear normal. Bilateral MCA M1 segments and bifurcations are patent without stenosis. Bilateral MCA branches are within normal limits. Venous sinuses: Early contrast timing, not well evaluated. Anatomic variants: Fetal type bilateral PCA origins. Mildly dominant left vertebral artery. Review of the MIP images confirms the above findings IMPRESSION: 1. Intermittently motion degraded exam. No large vessel occlusion, and generally mild for age atherosclerosis in the head and neck. No obvious hemodynamically significant arterial stenosis. 2. Note fetal type bilateral PCA origins, in conjunction with the pattern of punctate infarcts involving the posterior fossa but also the left PCA territory, this may indicate a systemic embolic event from the heart or proximal aorta. 3. Punctate  infarcts in #2 remain occult by CT. No new intracranial abnormality. 4. Prior CABG.  Aortic Atherosclerosis (ICD10-I70.0). 5. Generalized salivary gland atrophy with dystrophic calcifications or sialolithiasis.  Electronically Signed   By: Genevie Ann M.D.   On: 04/14/2022 06:03   DG Chest 2 View  Result Date: 04/13/2022 CLINICAL DATA:  Short of breath EXAM: CHEST - 2 VIEW COMPARISON:  12/17/2018 FINDINGS: Frontal and lateral views of the chest demonstrate postsurgical changes from median sternotomy and aortic valve replacement. The cardiac silhouette is enlarged. There is central vascular congestion and mild diffuse interstitial prominence, without airspace disease, effusion, or pneumothorax. Chronic elevation right hemidiaphragm. No acute bony abnormalities. IMPRESSION: 1. Findings consistent with congestive heart failure and mild interstitial edema. Electronically Signed   By: Randa Ngo M.D.   On: 04/13/2022 19:40   MR BRAIN WO CONTRAST  Result Date: 04/13/2022 CLINICAL DATA:  Double vision, syncope, unsteady on her feet EXAM: MRI HEAD WITHOUT CONTRAST TECHNIQUE: Multiplanar, multiecho pulse sequences of the brain and surrounding structures were obtained without intravenous contrast. COMPARISON:  12/17/2018 MRI head, correlation is also made with 04/13/2022 CT head FINDINGS: Brain: Punctate foci of restricted diffusion with ADC correlates in the right cerebellum (series 9, images 60 and 69), midbrain (series 9, images 75-77), left occipital lobe (series 9, images 82 and 84), and right parietal lobe (series 9, image 93). No acute hemorrhage, mass, mass effect, or midline shift. Punctate focus of hemosiderin deposition in the medial left parietal lobe, possibly sequela of prior microhemorrhage or tiny embolus. No hydrocephalus or extra-axial collection. Scattered T2 hyperintense signal in the periventricular white matter, likely the sequela of chronic small vessel ischemic disease. Partial empty sella.  Normal craniocervical junction. Vascular: Normal arterial flow voids. Skull and upper cervical spine: Normal marrow signal. Sinuses/Orbits: Minimal mucosal thickening in the ethmoid air cells. The orbits are unremarkable. Other: The mastoids are well aerated. IMPRESSION: Punctate acute infarcts in the right cerebellum, midbrain, left occipital lobe, and right parietal lobe. Given multiple vascular territories, a central embolic etiology is suspected. These results were called by telephone at the time of interpretation on 04/13/2022 at 7:33 pm to provider MILLER, who verbally acknowledged these results. Electronically Signed   By: Merilyn Baba M.D.   On: 04/13/2022 19:33   CT HEAD WO CONTRAST  Result Date: 04/13/2022 CLINICAL DATA:  Double vision, unsteady gait, tingling in extremities EXAM: CT HEAD WITHOUT CONTRAST TECHNIQUE: Contiguous axial images were obtained from the base of the skull through the vertex without intravenous contrast. RADIATION DOSE REDUCTION: This exam was performed according to the departmental dose-optimization program which includes automated exposure control, adjustment of the mA and/or kV according to patient size and/or use of iterative reconstruction technique. COMPARISON:  12/17/2018 FINDINGS: Brain: No acute infarct or hemorrhage. Lateral ventricles and midline structures are unremarkable. No acute extra-axial fluid collections. No mass effect. Vascular: No hyperdense vessel or unexpected calcification. Skull: Normal. Negative for fracture or focal lesion. Sinuses/Orbits: No acute finding. Stable osteoma left ethmoid air cell. Other: None. IMPRESSION: 1. No acute intracranial process. Electronically Signed   By: Randa Ngo M.D.   On: 04/13/2022 17:43    DISCHARGE EXAMINATION: Vitals:   04/16/22 0013 04/16/22 0405 04/16/22 0812 04/16/22 0901  BP: 135/70 118/66 119/79   Pulse: 84 83 86   Resp: _0 Temp: 98.7 F (37.1 C) 98.7 F (37.1 C) 98.6 F (37 C)   TempSrc:  Oral Oral    SpO2: 100% 99% 96% 96%  Weight:      Height:       General appearance: Awake alert.  In no distress Resp: Clear to auscultation bilaterally.  Normal effort Cardio: S1-S2 is normal regular.  No S3-S4.  No rubs murmurs or bruit GI: Abdomen is soft.  Nontender nondistended.  Bowel sounds are present normal.  No masses organomegaly   DISPOSITION: Home  Discharge Instructions     Ambulatory referral to Neurology   Complete by: As directed    An appointment is requested in approximately: 8 weeks   Call MD for:  difficulty breathing, headache or visual disturbances   Complete by: As directed    Call MD for:  extreme fatigue   Complete by: As directed    Call MD for:  persistant dizziness or light-headedness   Complete by: As directed    Call MD for:  persistant nausea and vomiting   Complete by: As directed    Call MD for:  severe uncontrolled pain   Complete by: As directed    Call MD for:  temperature >100.4   Complete by: As directed    Diet - low sodium heart healthy   Complete by: As directed    Discharge instructions   Complete by: As directed    Take your medications as prescribed.  Follow-up with your primary care provider within 1 week.  A referral sent to neurology for outpatient follow-up.  Seek attention if symptoms recur or if you develop fever again.  You were cared for by a hospitalist during your hospital stay. If you have any questions about your discharge medications or the care you received while you were in the hospital after you are discharged, you can call the unit and asked to speak with the hospitalist on call if the hospitalist that took care of you is not available. Once you are discharged, your primary care physician will handle any further medical issues. Please note that NO REFILLS for any discharge medications will be authorized once you are discharged, as it is imperative that you return to your primary care physician (or establish a  relationship with a primary care physician if you do not have one) for your aftercare needs so that they can reassess your need for medications and monitor your lab values. If you do not have a primary care physician, you can call [phone] for a physician referral.   Increase activity slowly   Complete by: As directed          Allergies as of 04/16/2022       Reactions   Doxycycline Hives, Rash   Naproxen Rash, Hives        Medication List     TAKE these medications    amoxicillin 500 MG capsule Commonly known as: AMOXIL Take 4 capsules 1 hour prior to any dental procedures What changed:  how much to take how to take this when to take this additional instructions   aspirin EC 81 MG tablet Take 1 tablet (81 mg total) by mouth daily. Swallow whole.   clopidogrel 75 MG tablet Commonly known as: PLAVIX Take 1 tablet (75 mg total) by mouth daily for 21 days. Only for 3 weeks Start taking on: April 17, 2022   ezetimibe 10 MG tablet Commonly known as: ZETIA Take 10 mg by mouth at bedtime.   magnesium oxide 400 MG tablet Commonly known as: MAG-OX Take 400 mg by mouth daily.   milk thistle 175 MG tablet Take 175 mg by mouth See admin instructions. Take 175 mg by mouth every 2-3 days   nadolol 20 MG tablet Commonly known as: CORGARD Take 20 mg by mouth daily.  Ozempic (0.25 or 0.5 MG/DOSE) 2 MG/3ML Sopn Generic drug: Semaglutide(0.25 or 0.5MG/DOS) Inject 2 mg into the skin once a week.   ProAir HFA 108 (90 Base) MCG/ACT inhaler Generic drug: albuterol INHALE 2 PUFFS INTO THE LUNGS EVERY 4 (FOUR) HOURS AS NEEDED FOR WHEEZING OR SHORTNESS OF BREATH.   Stiolto Respimat 2.5-2.5 MCG/ACT Aers Generic drug: Tiotropium Bromide-Olodaterol INHALE 2 PUFFS BY MOUTH INTO THE LUNGS DAILY What changed: See the new instructions.   Xiidra 5 % Soln Generic drug: Lifitegrast Place 1 drop into both eyes daily.               Durable Medical Equipment  (From  admission, onward)           Start     Ordered   04/15/22 0927  For home use only DME Cane  Once        04/15/22 0927              Follow-up Information     [facility] Follow up.   Specialty: Home Health Services Why: For home health services. they will call you in 1-2 days to set up [facility] services Contact information: [address] [place] [address] [phone]         Lawerance Cruel, MD Follow up.   Specialty: Family Medicine Why: post hospitalization follow up Contact information: [place] [address] [phone]         [name], MD Follow up.   Specialties: Cardiology, Radiology, Interventional Cardiology Contact information: [address] [address] [phone]                 TOTAL DISCHARGE TIME: 35 minutes  [place]  [facility] Pager on [email or web address]  04/16/2022, 11:57 AM

## 2022-04-16 NOTE — Care Management Important Message (Signed)
Important Message  Patient Details  Name: Whitney [name] MRN: [id number] Date of Birth: [birth year]   Medicare Important Message Given:  Yes     Ladon Heney Montine Circle 04/16/2022, 3:39 PM

## 2022-04-16 NOTE — TOC Transition Note (Signed)
Transition of Care [facility]) - CM/SW Discharge Note   Patient Details  Name: Whitney [name] MRN: [id number] Date of Birth: October 08, 1955  [facility] CM/SW Contact:  Pollie Friar, RN Phone Number: 04/16/2022, 2:10 PM   Clinical Narrative:    Pt discharging home with home health services through [facility]. Information on the AVS. Cane for home is at the bedside. Pt has transportation home.    Final next level of care: [place] Barriers to Discharge: No Barriers Identified   Patient Goals and CMS Choice Patient states their goals for this hospitalization and ongoing recovery are:: to go home CMS Medicare.gov Compare Post Acute Care list provided to:: Patient Choice offered to / list presented to : Patient  Discharge Placement                       Discharge Plan and Services   Discharge Planning Services: CM Consult Post Acute Care Choice: Home Health, Durable Medical Equipment          DME Arranged: Kasandra Knudsen DME Agency: [employer] Date DME Agency Contacted: 04/15/22 Time DME Agency Contacted: [phone] Representative spoke with at DME Agency: [place]: PT [place]: [place] Date [place]: 04/15/22 Time [place]: [phone] Representative spoke with at [place]: [place] ([place]) Interventions     Readmission Risk Interventions     No data to display

## 2022-04-16 NOTE — Consult Note (Addendum)
ELECTROPHYSIOLOGY CONSULT NOTE  Patient ID: Whitney [name] MRN: [id number], DOB/AGE: 1956-01-14   Admit date: 04/13/2022 Date of Consult: 04/16/2022  Primary Physician: Lawerance Cruel, MD Primary Cardiologist: Larae Grooms, MD  Primary Electrophysiologist: New to Dr. Caryl Comes Reason for Consultation: Cryptogenic stroke; recommendations regarding Implantable Loop Recorder Insurance: [employer]  History of Present Illness EP has been asked to evaluate Whitney [name] for placement of an implantable loop recorder to monitor for atrial fibrillation by Dr Erlinda Hong.    Whitney [name] is a 66 y.o. female with medical history of NASH, chronic thrombocytopenia treated at [facility], AS s/p tissue AVR 2019, DM2, and HLD  The patient was admitted on 04/13/2022 with imbalance, dipolopia, nausea, and numbness. No tPA given outside of window.    Imaging demonstrated Scattered punctate infarcts at right cerebellum, midbrain, left occipital and right parietal, etiology unclear, cardioembolic versus endocarditis .    She has undergone workup for stroke:  CT no acute abnormality CTA head and neck bilateral fetal PCAs MRI right cerebellum, midbrain, left occipital, right parietal punctate infarcts 2D Echo EF 60-65% LE venous doppler negative for DVTs 10/2020 30-day heart monitoring showed short runs of PACs LDL 97 HgbA1c 5.4 Heparin subcu for VTE prophylaxis No antithrombotic prior to admission, now on aspirin 81 mg daily and plavix 75 DAPT for 3 weeks and then ASA alone.   Patient counseled to be compliant with her antithrombotic medications Ongoing aggressive stroke risk factor management Therapy recommendations: Home health PT Disposition: Home today   The patient has been monitored on telemetry which has demonstrated sinus rhythm with no arrhythmias.  Inpatient stroke work-up will not require a TEE per Neurology.   Echocardiogram as above. Lab work is reviewed.  Prior to admission, the  patient denies chest pain, shortness of breath, dizziness, palpitations, or syncope.  She is recovering from her stroke with plans to return home  at discharge.  Past Medical History:  Diagnosis Date   Allergic rhinitis    Anemia    hx   Arthritis    Asthma    hx yrs ago   Cirrhosis ([place]) last albumin 3.3 done at [place] 06-16-2014 (under care everywhere tab in epic)   Secondary to Fatty liver --  followed by hepatology at [place] (dr Gerald Dexter)    Depression    Diabetes mellitus type II    type 2 diet conrolled   Dyspnea    Fibromyalgia    GERD (gastroesophageal reflux disease)    Heart murmur    asymptomatic ---  1989 from rhuematic fever   History of exercise stress test    05-05-2013---  negative bruce ETT given exercise workload,  no ischemia   History of hiatal hernia    History of kidney stones    History of rheumatic fever    1989   Hyperlipidemia    Leukocytopenia    Moderate aortic stenosis    AVA area 1.1cm2---  cardiologist --  dr Concepcion Living, 2014 in epic   NASH (nonalcoholic steatohepatitis)    OSA (obstructive sleep apnea)    was using CPAP before gastric sleeve 2015--  no uses after wt loss   Pneumonia    hx   Sjogren's syndrome ([place])    Thrombocytopenia ([place])      Surgical History:  Past Surgical History:  Procedure Laterality Date   AORTIC VALVE REPLACEMENT N/A 12/05/2017   Procedure: AORTIC VALVE REPLACEMENT (AVR) TISSUE VALVE 21MM INSPIRIS;  Surgeon: Gaye Pollack,  MD;  Location: [facility];  Service: Open Heart Surgery;  Laterality: N/A;   COLONOSCOPY WITH ESOPHAGOGASTRODUODENOSCOPY (EGD)     CYSTOSCOPY WITH RETROGRADE PYELOGRAM, URETEROSCOPY AND STENT PLACEMENT Left 01/21/2015   Procedure: CYSTOSCOPY WITH LEFT  RETROGRADE PYELOGRAM, LEFT URETEROSCOPY AND STENT PLACEMENT;  Surgeon: Festus Aloe, MD;  Location: [facility];  Service: Urology;  Laterality: Left;   CYSTOSCOPY WITH RETROGRADE PYELOGRAM, URETEROSCOPY AND STENT PLACEMENT Bilateral  02/24/2015   Procedure: CYSTOSCOPY WITH RIGHT RETROGRADE PYELOGRAM, BLADDER BIOPSY FULGERATION LEFT URETEROSCOPY AND STENT REPLACEMENT;  Surgeon: Festus Aloe, MD;  Location: [facility];  Service: Urology;  Laterality: Bilateral;   EXPLORATORY LAPAROSCOPY W/  CONE BIOPSY'S LEFT AND RIGHT LOBE OF LIVER  11-04-2007   HOLMIUM LASER APPLICATION Left 41/96/2229   Procedure: HOLMIUM LASER LITHOTRIPSY;  Surgeon: Festus Aloe, MD;  Location: [facility];  Service: Urology;  Laterality: Left;   HYSTEROSCOPY WITH D & C N/A 12/11/2012   Procedure: DILATATION AND CURETTAGE /HYSTEROSCOPY;  Surgeon: Maeola Sarah. Landry Mellow, MD;  Location: [place] [facility];  Service: Gynecology;  Laterality: N/A;   INGUINAL HERNIA REPAIR Left 10/22/2016   Procedure: LEFT INGUINAL HERNIA REPAIR;  Surgeon: Rolm Bookbinder, MD;  Location: [place];  Service: General;  Laterality: Left;  TAP BLOCK   INSERTION OF MESH Left 10/22/2016   Procedure: INSERTION OF MESH;  Surgeon: Rolm Bookbinder, MD;  Location: [place];  Service: General;  Laterality: Left;   LAPAROSCOPIC GASTRIC SLEEVE RESECTION  07-27-2013   PUBOVAGINAL SLING  04-10-2001   [place]   RIGHT/LEFT HEART CATH AND CORONARY ANGIOGRAPHY N/A 08/23/2017   Procedure: RIGHT/LEFT HEART CATH AND CORONARY ANGIOGRAPHY;  Surgeon: [name], MD;  Location: [place] [facility];  Service: Cardiovascular;  Laterality: N/A;   TEE WITHOUT CARDIOVERSION N/A 12/05/2017   Procedure: TRANSESOPHAGEAL ECHOCARDIOGRAM (TEE);  Surgeon: Gaye Pollack, MD;  Location: [place];  Service: Open Heart Surgery;  Laterality: N/A;   TONSILLECTOMY  1975   TRANSTHORACIC ECHOCARDIOGRAM  06-04-2012  dr Irish Lack   mild LVH,  grade I diastolic dysfunction/  ef 60-65%/  moderate LAE/  mild MV calcifation without stenosis,  mild MR/  moderate AV stenosis,  cannot r/o bicupsid, area 1.1cm2/  mild dilated aortic root/  trivial TR     Medications Prior to Admission  Medication Sig Dispense Refill  Last Dose   amoxicillin (AMOXIL) 500 MG capsule Take 4 capsules 1 hour prior to any dental procedures (Patient taking differently: Take 1,500 mg by mouth once. One hour prior to dental procedures.) 4 capsule 3 About a month ago   ezetimibe (ZETIA) 10 MG tablet Take 10 mg by mouth at bedtime.   11 04/13/2022   magnesium oxide (MAG-OX) 400 MG tablet Take 400 mg by mouth daily.   04/12/2022   milk thistle 175 MG tablet Take 175 mg by mouth See admin instructions. Take 175 mg by mouth every 2-3 days   Past Week   OZEMPIC, 0.25 OR 0.5 MG/DOSE, 2 MG/3ML SOPN Inject 2 mg into the skin once a week.   Past Week   PROAIR HFA 108 (90 Base) MCG/ACT inhaler INHALE 2 PUFFS INTO THE LUNGS EVERY 4 (FOUR) HOURS AS NEEDED FOR WHEEZING OR SHORTNESS OF BREATH. (Patient taking differently: Inhale 2 puffs into the lungs every 4 (four) hours as needed for wheezing or shortness of breath.) 8.5 each 5 Unknown   STIOLTO RESPIMAT 2.5-2.5 MCG/ACT AERS INHALE 2 PUFFS BY MOUTH INTO THE LUNGS DAILY (Patient taking differently:  Inhale 2 each into the lungs daily. INHALE 2 PUFFS BY MOUTH INTO THE LUNGS DAILY) 4 g 5 04/13/2022   XIIDRA 5 % SOLN Place 1 drop into both eyes daily.   04/12/2022   nadolol (CORGARD) 20 MG tablet Take 20 mg by mouth daily.   04/11/2022 at Night    Inpatient Medications:   arformoterol  15 mcg Nebulization BID   And   umeclidinium bromide  1 puff Inhalation Daily   aspirin  81 mg Oral Daily   clopidogrel  75 mg Oral Daily   cyanocobalamin  1,000 mcg Intramuscular Once   ezetimibe  10 mg Oral QHS   heparin  2,500 Units Subcutaneous Q8H    Allergies:  Allergies  Allergen Reactions   Doxycycline Hives and Rash   Naproxen Rash and Hives    Social History   Socioeconomic History   Marital status: Widowed    Spouse name: Married to [employer]   Number of children: Not on file   Years of education: Not on file   Highest education level: Not on file  Occupational History   Occupation: principal  of [employer]  Tobacco Use   Smoking status: Never   Smokeless tobacco: Never  Vaping Use   Vaping Use: Never used  Substance and Sexual Activity   Alcohol use: Yes    Comment: rarely   Drug use: No   Sexual activity: Not on file  Other Topics Concern   Not on file  Social History Narrative   Not on file   Social Determinants of Health   Financial Resource Strain: Not on file  Food Insecurity: No Food Insecurity (04/14/2022)   Hunger Vital Sign    Worried About Running Out of Food in the Last Year: Never true    Ran Out of Food in the Last Year: Never true  Transportation Needs: No Transportation Needs (04/14/2022)   PRAPARE - Hydrologist (Medical): No    Lack of Transportation (Non-Medical): No  Physical Activity: Not on file  Stress: Not on file  Social Connections: Not on file  Intimate Partner Violence: Not At Risk (04/14/2022)   Humiliation, Afraid, Rape, and Kick questionnaire    Fear of Current or Ex-Partner: No    Emotionally Abused: No    Physically Abused: No    Sexually Abused: No     Family History  Problem Relation Age of Onset   Alzheimer's disease Father    Hip fracture Father    Asthma Father    Heart disease Father    Heart attack Father    Hypertension Father    Rheum arthritis Mother    Heart disease Mother    Allergies Daughter    Asthma Paternal Grandmother    Asthma Daughter    Stroke Neg Hx       Review of Systems: All other systems reviewed and are otherwise negative except as noted above.  Physical Exam: Vitals:   04/16/22 0013 04/16/22 0405 04/16/22 0812 04/16/22 0901  BP: 135/70 118/66 119/79   Pulse: 84 83 86   Resp: _0 Temp: 98.7 F (37.1 C) 98.7 F (37.1 C) 98.6 F (37 C)   TempSrc: Oral Oral    SpO2: 100% 99% 96% 96%  Weight:      Height:        GEN- The patient is well appearing, alert and oriented x 3 today.   Head- normocephalic, atraumatic Eyes-  Sclera clear, conjunctiva  pink Ears- hearing intact Oropharynx- clear Neck- supple Lungs- Clear to ausculation bilaterally, normal work of breathing Heart- Regular rate and rhythm, no murmurs, rubs or gallops  GI- soft, NT, ND, + BS Extremities- no clubbing, cyanosis, or edema MS- no significant deformity or atrophy Skin- no rash or lesion Psych- euthymic mood, full affect Neuro- no focal deficits   Labs:   Lab Results  Component Value Date   WBC 3.0 (L) 04/15/2022   HGB 12.4 04/15/2022   HCT 37.8 04/15/2022   MCV 93.6 04/15/2022   PLT 79 (L) 04/15/2022    Recent Labs  Lab 04/15/22 0248  NA 136  K 3.7  CL 104  CO2 25  BUN 15  CREATININE 0.71  CALCIUM 8.0*  PROT 5.3*  BILITOT 1.1  ALKPHOS 52  ALT 11  AST 25  GLUCOSE 110*     Radiology/Studies: VAS [place] LOWER EXTREMITY VENOUS (DVT)  Result Date: 04/16/2022  Lower Venous DVT Study Patient Name:  ADAJA WANDER Carboni  Date of Exam:   04/16/2022 Medical Rec #: [id number]       Accession #:    [id number] Date of Birth: 1955/07/06        Patient Gender: F Patient Age:   1 years Exam Location:  [facility] Procedure:      VAS [place] LOWER EXTREMITY VENOUS (DVT) Referring Phys: Bonnielee Haff --------------------------------------------------------------------------------  Indications: Stroke, and Fever.  Limitations: Interstitial edema. Comparison Study: Prior negative right LEV done 06/15/13 Performing Technologist: Sharion Dove RVS  Examination Guidelines: A complete evaluation includes B-mode imaging, spectral Doppler, color Doppler, and power Doppler as needed of all accessible portions of each vessel. Bilateral testing is considered an integral part of a complete examination. Limited examinations for reoccurring indications may be performed as noted. The reflux portion of the exam is performed with the patient in reverse Trendelenburg.  +---------+---------------+---------+-----------+----------+--------------+ RIGHT     CompressibilityPhasicitySpontaneityPropertiesThrombus Aging +---------+---------------+---------+-----------+----------+--------------+ CFV      Full           Yes      Yes                                 +---------+---------------+---------+-----------+----------+--------------+ SFJ      Full                                                        +---------+---------------+---------+-----------+----------+--------------+ FV Prox  Full                                                        +---------+---------------+---------+-----------+----------+--------------+ FV Mid   Full                                                        +---------+---------------+---------+-----------+----------+--------------+ FV DistalFull                                                        +---------+---------------+---------+-----------+----------+--------------+  PFV      Full                                                        +---------+---------------+---------+-----------+----------+--------------+ POP      Full           Yes      Yes                                 +---------+---------------+---------+-----------+----------+--------------+ PTV      Full                                                        +---------+---------------+---------+-----------+----------+--------------+ PERO     Full                                                        +---------+---------------+---------+-----------+----------+--------------+   +---------+---------------+---------+-----------+----------+--------------+ LEFT     CompressibilityPhasicitySpontaneityPropertiesThrombus Aging +---------+---------------+---------+-----------+----------+--------------+ CFV      Full           Yes      Yes                                 +---------+---------------+---------+-----------+----------+--------------+ SFJ      Full                                                         +---------+---------------+---------+-----------+----------+--------------+ FV Prox  Full                                                        +---------+---------------+---------+-----------+----------+--------------+ FV Mid   Full                                                        +---------+---------------+---------+-----------+----------+--------------+ FV DistalFull                                                        +---------+---------------+---------+-----------+----------+--------------+ PFV      Full                                                        +---------+---------------+---------+-----------+----------+--------------+  POP      Full           Yes      Yes                                 +---------+---------------+---------+-----------+----------+--------------+ PTV      Full                                                        +---------+---------------+---------+-----------+----------+--------------+ PERO     Full                                                        +---------+---------------+---------+-----------+----------+--------------+     Summary: BILATERAL: - No evidence of deep vein thrombosis seen in the lower extremities, bilaterally. -No evidence of popliteal cyst, bilaterally.   *See table(s) above for measurements and observations.    Preliminary    ECHOCARDIOGRAM COMPLETE BUBBLE STUDY  Result Date: 04/15/2022    ECHOCARDIOGRAM REPORT   Patient Name:   MARIALICE NEWKIRK Meritt Date of Exam: 04/14/2022 Medical Rec #:  [id number]      Height:       64.0 in Accession #:    [id number]     Weight:       216.0 lb Date of Birth:  1955-07-30       BSA:          2.022 m Patient Age:    78 years       BP:           125/73 mmHg Patient Gender: F              HR:           96 bpm. Exam Location:  Inpatient Procedure: 2D Echo, Cardiac Doppler and Color Doppler Indications:    Stroke  History:        Patient has prior history  of Echocardiogram examinations, most                 recent 12/26/2018. Aortic Valve Disease and Mitral Valve Disease.                 MAC. 21 mm Inspiris bovine pericardial valve in the aortic                 position. Procedure date 12/05/17.  Sonographer:    Clayton Lefort RDCS (AE) Referring Phys: [id number] [place]  1. Left ventricular ejection fraction, by estimation, is 60 to 65%. The left ventricle has normal function. The left ventricle has no regional wall motion abnormalities. There is mild concentric left ventricular hypertrophy. Left ventricular diastolic parameters are consistent with Grade II diastolic dysfunction (pseudonormalization). Elevated left ventricular end-diastolic pressure.  2. Right ventricular systolic function is normal. The right ventricular size is normal. There is moderately elevated pulmonary artery systolic pressure.  3. Left atrial size was severely dilated.  4. Right atrial size was severely dilated.  5. Aortic dilatation noted. There is mild dilatation of the ascending aorta, measuring 41 mm.  6. The mitral valve is degenerative. Mild  mitral valve regurgitation. Mild mitral stenosis. The mean mitral valve gradient is 4.0 mmHg. There is heavy calcification of the mitral annulus extending into the LV cavity and involving the submitral apparatus. There is caseous mitral annular calcification extending into the basal lateral LV wall.  7. The aortic valve has been repaired/replaced. There is a 21 mm Inspiris bovine pericardial valve present in the aortic position.     Perivalvular Aortic valve regurgitation is trivial. There is moderate calcification and thickening of the AV leaflets. Mild aortic stenosis is present. Aortic valve mean gradient measures 21.0 mmHg. Aortic valve peak gradient measures 37.9 mmHg. Aortic valve area, by VTI measures 1.91 cm.  8. The inferior vena cava is normal in size with greater than 50% respiratory variability, suggesting right atrial  pressure of 3 mmHg.  9. Compared to study dated 12/26/2018, the is no significant change in mean AVG or DVI. FINDINGS  Left Ventricle: Left ventricular ejection fraction, by estimation, is 60 to 65%. The left ventricle has normal function. The left ventricle has no regional wall motion abnormalities. The left ventricular internal cavity size was normal in size. There is  mild concentric left ventricular hypertrophy. Left ventricular diastolic parameters are consistent with Grade II diastolic dysfunction (pseudonormalization). Elevated left ventricular end-diastolic pressure. Right Ventricle: The right ventricular size is normal. No increase in right ventricular wall thickness. Right ventricular systolic function is normal. There is moderately elevated pulmonary artery systolic pressure. The tricuspid regurgitant velocity is 3.40 m/s, and with an assumed right atrial pressure of 8 mmHg, the estimated right ventricular systolic pressure is 73.5 mmHg. Left Atrium: Left atrial size was severely dilated. Right Atrium: Right atrial size was severely dilated. Pericardium: There is no evidence of pericardial effusion. Mitral Valve: There is caseous mitral annular calcification extending into the basal lateral LV wall. The mitral valve is degenerative in appearance. There is severe thickening of the mitral valve leaflet(s). There is severe calcification of the mitral valve leaflet(s). Severe mitral annular calcification. Mild mitral valve regurgitation. Mild mitral valve stenosis. MV peak gradient, 6.5 mmHg. The mean mitral valve gradient is 4.0 mmHg. Tricuspid Valve: The tricuspid valve is normal in structure. Tricuspid valve regurgitation is mild . No evidence of tricuspid stenosis. Aortic Valve: The aortic valve has been repaired/replaced. There is moderate calcification of the aortic valve. There is moderate thickening of the aortic valve. Aortic valve regurgitation is trivial. Mild aortic stenosis is present. Aortic  valve mean gradient measures 21.0 mmHg. Aortic valve peak gradient measures 37.9 mmHg. Aortic valve area, by VTI measures 1.91 cm. There is a 21 mm Inspiris bovine pericardial valve present in the aortic position. Pulmonic Valve: The pulmonic valve was normal in structure. Pulmonic valve regurgitation is trivial. No evidence of pulmonic stenosis. Aorta: Aortic dilatation noted. There is mild dilatation of the ascending aorta, measuring 41 mm. Venous: The inferior vena cava is normal in size with greater than 50% respiratory variability, suggesting right atrial pressure of 3 mmHg. IAS/Shunts: No atrial level shunt detected by color flow Doppler.  LEFT VENTRICLE PLAX 2D LVIDd:         4.60 cm   Diastology LVIDs:         3.30 cm   LV e' medial:    6.96 cm/s LV PW:         1.40 cm   LV E/e' medial:  18.7 LV IVS:        1.20 cm   LV e' lateral:   9.25 cm/s LVOT  diam:     2.10 cm   LV E/e' lateral: 14.1 LV SV:         99 LV SV Index:   49 LVOT Area:     3.46 cm  RIGHT VENTRICLE             IVC RV Basal diam:  3.80 cm     IVC diam: 1.80 cm RV S prime:     16.60 cm/s TAPSE (M-mode): 2.5 cm LEFT ATRIUM              Index        RIGHT ATRIUM           Index LA diam:        3.70 cm  1.83 cm/m   RA Area:     21.40 cm LA Vol (A2C):   150.0 ml 74.20 ml/m  RA Volume:   75.40 ml  37.30 ml/m LA Vol (A4C):   108.0 ml 53.42 ml/m LA Biplane Vol: 131.0 ml 64.80 ml/m  AORTIC VALVE AV Area (Vmax):    1.73 cm AV Area (Vmean):   1.74 cm AV Area (VTI):     1.91 cm AV Vmax:           308.00 cm/s AV Vmean:          211.000 cm/s AV VTI:            0.517 m AV Peak Grad:      37.9 mmHg AV Mean Grad:      21.0 mmHg LVOT Vmax:         154.00 cm/s LVOT Vmean:        106.000 cm/s LVOT VTI:          0.285 m LVOT/AV VTI ratio: 0.55  AORTA Ao Root diam: 3.10 cm Ao Asc diam:  4.10 cm MITRAL VALVE                TRICUSPID VALVE MV Area (PHT): 3.08 cm     TR Peak grad:   46.2 mmHg MV Area VTI:   2.79 cm     TR Vmax:        340.00 cm/s MV Peak  grad:  6.5 mmHg MV Mean grad:  4.0 mmHg     SHUNTS MV Vmax:       1.27 m/s     Systemic VTI:  0.29 m MV Vmean:      102.0 cm/s   Systemic Diam: 2.10 cm MV Decel Time: 246 msec MV E velocity: 130.00 cm/s MV A velocity: 135.00 cm/s MV E/A ratio:  0.96 Fransico Him MD Electronically signed by Fransico Him MD Signature Date/Time: 04/15/2022/11:35:43 AM    Final    CT Chest High Resolution  Result Date: 04/14/2022 CLINICAL DATA:  Shortness of breath EXAM: CT CHEST WITHOUT CONTRAST TECHNIQUE: Multidetector CT imaging of the chest was performed following the standard protocol without intravenous contrast. High resolution imaging of the lungs, as well as inspiratory and expiratory imaging, was performed. RADIATION DOSE REDUCTION: This exam was performed according to the departmental dose-optimization program which includes automated exposure control, adjustment of the mA and/or kV according to patient size and/or use of iterative reconstruction technique. COMPARISON:  CT abdomen pelvis, 05/15/2019 FINDINGS: Cardiovascular: Aortic atherosclerosis. Aortic valve prosthesis. Cardiomegaly. Left and right coronary artery calcifications. Dense mitral annulus calcifications. Enlargement of the main pulmonary artery measuring up to 3.8 cm in caliber. No pericardial effusion. Mediastinum/Nodes: No enlarged mediastinal, hilar, or axillary lymph nodes.  Thyroid gland, trachea, and esophagus demonstrate no significant findings. Lungs/Pleura: Examination of the lungs is limited by breath motion artifact and the presence of pleural effusions. Trace bilateral pleural effusions and associated atelectasis or consolidation. Scarring and volume loss of the right lung base with elevation of the right hemidiaphragm, unchanged compared to prior examination. No specific evidence of fibrotic interstitial lung disease. Upper Abdomen: No acute abnormality. Cirrhosis and splenomegaly. Partially imaged sleeve gastrectomy. Musculoskeletal: No chest  wall abnormality. No acute osseous findings. IMPRESSION: 1. Examination of the lungs is limited by breath motion artifact and the presence of pleural effusions. In general, interstitial lung disease protocol CT is of limited utility in the acute inpatient setting and should be deferred to outpatient evaluation. 2. Trace bilateral pleural effusions and associated atelectasis or consolidation. Bland appearing post infectious or inflammatory scarring and volume loss of the right lung base with elevation of the right hemidiaphragm, unchanged compared to the lung bases as included on prior examination of the abdomen. No specific evidence of fibrotic interstitial lung disease. 3. Cardiomegaly and coronary artery disease. 4. Enlargement of the main pulmonary artery, as can be seen in pulmonary hypertension. 5. Cirrhosis and splenomegaly. Aortic Atherosclerosis (ICD10-I70.0). Electronically Signed   By: Delanna Ahmadi M.D.   On: 04/14/2022 12:47   CT ANGIO HEAD NECK W WO CM  Result Date: 04/14/2022 CLINICAL DATA:  66 year old female with double vision. Scattered punctate infarcts in the brainstem, cerebellum, left occipital lobe on MRI yesterday. EXAM: CT ANGIOGRAPHY HEAD AND NECK TECHNIQUE: Multidetector CT imaging of the head and neck was performed using the standard protocol during bolus administration of intravenous contrast. Multiplanar CT image reconstructions and MIPs were obtained to evaluate the vascular anatomy. Carotid stenosis measurements (when applicable) are obtained utilizing NASCET criteria, using the distal internal carotid diameter as the denominator. RADIATION DOSE REDUCTION: This exam was performed according to the departmental dose-optimization program which includes automated exposure control, adjustment of the mA and/or kV according to patient size and/or use of iterative reconstruction technique. CONTRAST:  22m OMNIPAQUE IOHEXOL 350 MG/ML SOLN COMPARISON:  Head CT and brain MRI yesterday.  FINDINGS: CT HEAD Brain: Multiple punctate ischemic foci detected in the brainstem, right cerebellum, and left occipital lobe remain occult by CT. No midline shift, ventriculomegaly, mass effect, evidence of mass lesion, intracranial hemorrhage or evidence of cortically based acute infarction. Calvarium and skull base: No acute osseous abnormality identified. Paranasal sinuses: Visualized paranasal sinuses and mastoids are stable and well aerated. Incidental left ethmoid osteoma (normal variant). Orbits: No acute orbit or scalp soft tissue finding. CTA NECK Skeleton: Prior sternotomy. Motion artifact in the cervical spine and at the mandible. No acute osseous abnormality identified. Upper chest: Bilateral upper lung mosaic attenuation. No superior mediastinal lymphadenopathy. Visible central pulmonary arteries are patent. Other neck: Bilateral salivary gland atrophy and extensive parotid sialolithiasis or dystrophic calcifications. Motion artifact in the neck. No neck mass or lymphadenopathy is evident. Aortic arch: Calcified aortic atherosclerosis. Prior CABG. Three vessel arch configuration. Tortuous arch. Right carotid system: No significant brachiocephalic artery plaque or stenosis. Proximal right CCA obscured by dense subclavian venous contrast streak. But the visible right CCA is patent before the bifurcation with tortuosity but no plaque. Partially retropharyngeal course. Retropharyngeal right carotid bifurcation. Mild calcified plaque at the right ICA bulb without stenosis. Motion artifact but no evidence of right ICA stenosis to the skull base. Additional tortuosity below the skull base. Left carotid system: Mild left CCA origin calcified plaque without stenosis. Partially retropharyngeal  course. Mild calcified plaque at the left ICA origin without stenosis. Motion artifact and tortuosity but no stenosis identified. Vertebral arteries: Proximal right subclavian artery appears normal. Right vertebral artery  origin partially obscured by motion and streak artifact, not well evaluated. Right vertebral artery is mildly non dominant and tortuous. Motion artifact through the mid cervical spine. The right vertebral remains patent to the skull base, no obvious stenosis. Proximal left subclavian calcified plaque without stenosis. Normal left vertebral artery origin. Tortuous left V1 segment. Mildly dominant left vertebral artery with some of the V2 segment obscured by motion artifact but patent to the skull base with no obvious stenosis. CTA HEAD Posterior circulation: Vertebral V4 segments appear normal to the vertebrobasilar junction. The left is mildly dominant. Both PICA origins are patent. Patent basilar artery without stenosis. Mild motion artifact at the basilar tip. Fetal type bilateral PCA origins. SCA origins are patent. Bilateral PCA branch motion artifact. No PCA occlusion or obvious stenosis. Anterior circulation: Both ICA siphons are patent. Mild motion artifact. Mild to moderate cavernous segment plaque bilaterally. Difficult to exclude moderate bilateral siphon stenosis but probably exaggerated from motion artifact. Both posterior communicating artery origins appear patent and normal. Patent carotid termini. Normal MCA and ACA origins. Normal anterior communicating artery. Bilateral ACA branches appear normal. Bilateral MCA M1 segments and bifurcations are patent without stenosis. Bilateral MCA branches are within normal limits. Venous sinuses: Early contrast timing, not well evaluated. Anatomic variants: Fetal type bilateral PCA origins. Mildly dominant left vertebral artery. Review of the MIP images confirms the above findings IMPRESSION: 1. Intermittently motion degraded exam. No large vessel occlusion, and generally mild for age atherosclerosis in the head and neck. No obvious hemodynamically significant arterial stenosis. 2. Note fetal type bilateral PCA origins, in conjunction with the pattern of punctate  infarcts involving the posterior fossa but also the left PCA territory, this may indicate a systemic embolic event from the heart or proximal aorta. 3. Punctate infarcts in #2 remain occult by CT. No new intracranial abnormality. 4. Prior CABG.  Aortic Atherosclerosis (ICD10-I70.0). 5. Generalized salivary gland atrophy with dystrophic calcifications or sialolithiasis. Electronically Signed   By: Genevie Ann M.D.   On: 04/14/2022 06:03   DG Chest 2 View  Result Date: 04/13/2022 CLINICAL DATA:  Short of breath EXAM: CHEST - 2 VIEW COMPARISON:  12/17/2018 FINDINGS: Frontal and lateral views of the chest demonstrate postsurgical changes from median sternotomy and aortic valve replacement. The cardiac silhouette is enlarged. There is central vascular congestion and mild diffuse interstitial prominence, without airspace disease, effusion, or pneumothorax. Chronic elevation right hemidiaphragm. No acute bony abnormalities. IMPRESSION: 1. Findings consistent with congestive heart failure and mild interstitial edema. Electronically Signed   By: Randa Ngo M.D.   On: 04/13/2022 19:40   MR BRAIN WO CONTRAST  Result Date: 04/13/2022 CLINICAL DATA:  Double vision, syncope, unsteady on her feet EXAM: MRI HEAD WITHOUT CONTRAST TECHNIQUE: Multiplanar, multiecho pulse sequences of the brain and surrounding structures were obtained without intravenous contrast. COMPARISON:  12/17/2018 MRI head, correlation is also made with 04/13/2022 CT head FINDINGS: Brain: Punctate foci of restricted diffusion with ADC correlates in the right cerebellum (series 9, images 60 and 69), midbrain (series 9, images 75-77), left occipital lobe (series 9, images 82 and 84), and right parietal lobe (series 9, image 93). No acute hemorrhage, mass, mass effect, or midline shift. Punctate focus of hemosiderin deposition in the medial left parietal lobe, possibly sequela of prior microhemorrhage or tiny embolus. No  hydrocephalus or extra-axial  collection. Scattered T2 hyperintense signal in the periventricular white matter, likely the sequela of chronic small vessel ischemic disease. Partial empty sella. Normal craniocervical junction. Vascular: Normal arterial flow voids. Skull and upper cervical spine: Normal marrow signal. Sinuses/Orbits: Minimal mucosal thickening in the ethmoid air cells. The orbits are unremarkable. Other: The mastoids are well aerated. IMPRESSION: Punctate acute infarcts in the right cerebellum, midbrain, left occipital lobe, and right parietal lobe. Given multiple vascular territories, a central embolic etiology is suspected. These results were called by telephone at the time of interpretation on 04/13/2022 at 7:33 pm to provider MILLER, who verbally acknowledged these results. Electronically Signed   By: Merilyn Baba M.D.   On: 04/13/2022 19:33   CT HEAD WO CONTRAST  Result Date: 04/13/2022 CLINICAL DATA:  Double vision, unsteady gait, tingling in extremities EXAM: CT HEAD WITHOUT CONTRAST TECHNIQUE: Contiguous axial images were obtained from the base of the skull through the vertex without intravenous contrast. RADIATION DOSE REDUCTION: This exam was performed according to the departmental dose-optimization program which includes automated exposure control, adjustment of the mA and/or kV according to patient size and/or use of iterative reconstruction technique. COMPARISON:  12/17/2018 FINDINGS: Brain: No acute infarct or hemorrhage. Lateral ventricles and midline structures are unremarkable. No acute extra-axial fluid collections. No mass effect. Vascular: No hyperdense vessel or unexpected calcification. Skull: Normal. Negative for fracture or focal lesion. Sinuses/Orbits: No acute finding. Stable osteoma left ethmoid air cell. Other: None. IMPRESSION: 1. No acute intracranial process. Electronically Signed   By: Randa Ngo M.D.   On: 04/13/2022 17:43    12-lead ECG NSR at 89 bpm (personally reviewed) All prior  EKG's in EPIC reviewed with no documented atrial fibrillation  Telemetry NSR/ST 90-110s (personally reviewed)  Assessment and Plan:  1. Cryptogenic stroke The patient presents with cryptogenic stroke.  The patient does not have a TEE planned for this AM.  I spoke at length with the patient about monitoring for afib with an implantable loop recorder.  Risks, benefits, and alteratives to implantable loop recorder were discussed with the patient today.   At this time, the patient is very clear in their decision to proceed with implantable loop recorder.   2. Post operative AF, s/p AVR 2019 Likely reactive, and not likely to be contributory to this event.  Echo this admission with normal EF, stable valve, and bi-atrial enlargement.   3. Fever Earlier this admission with no clear source. Tmax 98.2  Resp panel negative, PCT has been < 01. BCx negative.   4. Thrombocytopenia PLT  79* (12/10 0248) HGB  12.4 (12/10 0248) Per notes ? Secondary to NASH. Followed at [facility] was reviewed with the patient (keep incision clean and dry for 3 days). Please call with questions.   Shirley Friar, PA-C 04/16/2022 11:52 AM  (As above)  Clinically resolved Reviewed the role implantable loop recorder  Agreeable  Will resume nadolol at discharge

## 2022-04-16 NOTE — Progress Notes (Signed)
VASCULAR LAB    Bilateral lower extremity venous duplex has been performed.  See CV proc for preliminary results.   Atom Solivan, RVT 04/16/2022, 11:14 AM

## 2022-04-16 NOTE — Plan of Care (Signed)
Problem: Education: Goal: Knowledge of disease or condition will improve Outcome: Adequate for Discharge Goal: Knowledge of secondary prevention will improve (MUST DOCUMENT ALL) Outcome: Adequate for Discharge Goal: Knowledge of patient specific risk factors will improve [place] N/A or DELETE if not current risk factor) Outcome: Adequate for Discharge   Problem: Ischemic Stroke/TIA Tissue Perfusion: Goal: Complications of ischemic stroke/TIA will be minimized Outcome: Adequate for Discharge   Problem: Coping: Goal: Will verbalize positive feelings about self Outcome: Adequate for Discharge Goal: Will identify appropriate support needs Outcome: Adequate for Discharge   Problem: Health Behavior/Discharge Planning: Goal: Ability to manage health-related needs will improve Outcome: Adequate for Discharge Goal: Goals will be collaboratively established with patient/family Outcome: Adequate for Discharge   Problem: Self-Care: Goal: Ability to participate in self-care as condition permits will improve Outcome: Adequate for Discharge Goal: Verbalization of feelings and concerns over difficulty with self-care will improve Outcome: Adequate for Discharge Goal: Ability to communicate needs accurately will improve Outcome: Adequate for Discharge   Problem: Nutrition: Goal: Risk of aspiration will decrease Outcome: Adequate for Discharge Goal: Dietary intake will improve Outcome: Adequate for Discharge   Problem: Education: Goal: Knowledge of secondary prevention will improve (MUST DOCUMENT ALL) Outcome: Adequate for Discharge   Problem: Ischemic Stroke/TIA Tissue Perfusion: Goal: Complications of ischemic stroke/TIA will be minimized Outcome: Adequate for Discharge   Problem: Coping: Goal: Will verbalize positive feelings about self Outcome: Adequate for Discharge Goal: Will identify appropriate support needs Outcome: Adequate for Discharge   Problem: Health Behavior/Discharge  Planning: Goal: Goals will be collaboratively established with patient/family Outcome: Adequate for Discharge   Problem: Self-Care: Goal: Ability to participate in self-care as condition permits will improve Outcome: Adequate for Discharge Goal: Verbalization of feelings and concerns over difficulty with self-care will improve Outcome: Adequate for Discharge Goal: Ability to communicate needs accurately will improve Outcome: Adequate for Discharge   Problem: Nutrition: Goal: Risk of aspiration will decrease Outcome: Adequate for Discharge Goal: Dietary intake will improve Outcome: Adequate for Discharge   Problem: Education: Goal: Knowledge of General Education information will improve Description: Including pain rating scale, medication(s)/side effects and non-pharmacologic comfort measures Outcome: Adequate for Discharge   Problem: Health Behavior/Discharge Planning: Goal: Ability to manage health-related needs will improve Outcome: Adequate for Discharge   Problem: Clinical Measurements: Goal: Ability to maintain clinical measurements within normal limits will improve Outcome: Adequate for Discharge Goal: Will remain free from infection Outcome: Adequate for Discharge Goal: Diagnostic test results will improve Outcome: Adequate for Discharge Goal: Respiratory complications will improve Outcome: Adequate for Discharge Goal: Cardiovascular complication will be avoided Outcome: Adequate for Discharge   Problem: Activity: Goal: Risk for activity intolerance will decrease Outcome: Adequate for Discharge   Problem: Nutrition: Goal: Adequate nutrition will be maintained Outcome: Adequate for Discharge   Problem: Coping: Goal: Level of anxiety will decrease Outcome: Adequate for Discharge   Problem: Elimination: Goal: Will not experience complications related to bowel motility Outcome: Adequate for Discharge Goal: Will not experience complications related to urinary  retention Outcome: Adequate for Discharge   Problem: Pain Managment: Goal: General experience of comfort will improve Outcome: Adequate for Discharge   Problem: Safety: Goal: Ability to remain free from injury will improve Outcome: Adequate for Discharge   Problem: Skin Integrity: Goal: Risk for impaired skin integrity will decrease Outcome: Adequate for Discharge   Problem: Education: Goal: Knowledge of General Education information will improve Description: Including pain rating scale, medication(s)/side effects and non-pharmacologic comfort measures Outcome: Adequate for Discharge  Problem: Health Behavior/Discharge Planning: Goal: Ability to manage health-related needs will improve Outcome: Adequate for Discharge   Problem: Clinical Measurements: Goal: Ability to maintain clinical measurements within normal limits will improve Outcome: Adequate for Discharge Goal: Will remain free from infection Outcome: Adequate for Discharge Goal: Diagnostic test results will improve Outcome: Adequate for Discharge Goal: Respiratory complications will improve Outcome: Adequate for Discharge Goal: Cardiovascular complication will be avoided Outcome: Adequate for Discharge   Problem: Activity: Goal: Risk for activity intolerance will decrease Outcome: Adequate for Discharge   Problem: Nutrition: Goal: Adequate nutrition will be maintained Outcome: Adequate for Discharge   Problem: Coping: Goal: Level of anxiety will decrease Outcome: Adequate for Discharge   Problem: Elimination: Goal: Will not experience complications related to bowel motility Outcome: Adequate for Discharge Goal: Will not experience complications related to urinary retention Outcome: Adequate for Discharge   Problem: Pain Managment: Goal: General experience of comfort will improve Outcome: Adequate for Discharge   Problem: Safety: Goal: Ability to remain free from injury will improve Outcome:  Adequate for Discharge   Problem: Skin Integrity: Goal: Risk for impaired skin integrity will decrease Outcome: Adequate for Discharge

## 2022-04-17 ENCOUNTER — Telehealth: Payer: Self-pay

## 2022-04-17 ENCOUNTER — Other Ambulatory Visit: Payer: Self-pay | Admitting: Pulmonary Disease

## 2022-04-17 NOTE — Telephone Encounter (Signed)
-----   Message from Shirley Friar, PA-C sent at 04/16/2022  1:41 PM EST ----- Regarding: Same Day Discharge LOOP 04/16/22 Dr. Caryl Comes

## 2022-04-17 NOTE — Telephone Encounter (Signed)
  Loop Recorder Follow up   Is patient connected to [facility]? Yes   Have steri-strips fallen off or been removed? No   Does the patient need in office follow up? No   Please continue to monitor your cardiac device site for redness, swelling, and drainage. Call the device clinic at [phone] if you experience these symptoms, fever/chills, or have questions about your device.   Remote monitoring is used to monitor your cardiac device from home. This monitoring is scheduled every month by our office. It allows [place] to keep an eye on the functioning of your device to ensure it is working properly.

## 2022-04-18 ENCOUNTER — Telehealth: Payer: Self-pay

## 2022-04-18 LAB — CULTURE, BLOOD (ROUTINE X 2): Culture: NO GROWTH

## 2022-04-18 NOTE — Telephone Encounter (Signed)
Following alert received from [employer] received for ILR alert summary report received. Battery status OK. Normal device function. No new symptom, tachy, brady, or pause episodes.  AF episode ongoing from 12/12 @ 19:13, rates >100, burden 9.6%, no OAC, DAPT only.    Patient called, advised AF noted on ILR and recommend AF clinic referral to discuss [facility] therapy. Patient agreeable. Advised I will forward to AF clinic and someone will reach out with apt. Patient voiced understanding.

## 2022-04-19 LAB — CULTURE, BLOOD (ROUTINE X 2)
Culture: NO GROWTH
Special Requests: ADEQUATE

## 2022-04-19 LAB — VITAMIN B6: Vitamin B6: 2.2 ug/L — ABNORMAL LOW (ref 3.4–65.2)

## 2022-04-19 LAB — VITAMIN B1: Vitamin B1 (Thiamine): 94.3 nmol/L (ref 66.5–200.0)

## 2022-04-20 DIAGNOSIS — I088 Other rheumatic multiple valve diseases: Secondary | ICD-10-CM | POA: Diagnosis not present

## 2022-04-20 DIAGNOSIS — I700 Atherosclerosis of aorta: Secondary | ICD-10-CM | POA: Diagnosis not present

## 2022-04-20 DIAGNOSIS — I69354 Hemiplegia and hemiparesis following cerebral infarction affecting left non-dominant side: Secondary | ICD-10-CM | POA: Diagnosis not present

## 2022-04-20 DIAGNOSIS — M1990 Unspecified osteoarthritis, unspecified site: Secondary | ICD-10-CM | POA: Diagnosis not present

## 2022-04-20 DIAGNOSIS — I509 Heart failure, unspecified: Secondary | ICD-10-CM | POA: Diagnosis not present

## 2022-04-20 DIAGNOSIS — I69398 Other sequelae of cerebral infarction: Secondary | ICD-10-CM | POA: Diagnosis not present

## 2022-04-20 DIAGNOSIS — E119 Type 2 diabetes mellitus without complications: Secondary | ICD-10-CM | POA: Diagnosis not present

## 2022-04-20 DIAGNOSIS — H532 Diplopia: Secondary | ICD-10-CM | POA: Diagnosis not present

## 2022-04-20 DIAGNOSIS — I2510 Atherosclerotic heart disease of native coronary artery without angina pectoris: Secondary | ICD-10-CM | POA: Diagnosis not present

## 2022-04-25 ENCOUNTER — Ambulatory Visit (HOSPITAL_COMMUNITY): Payer: Medicare PPO | Admitting: Physician Assistant

## 2022-04-25 ENCOUNTER — Encounter (HOSPITAL_COMMUNITY): Payer: Self-pay

## 2022-05-04 ENCOUNTER — Telehealth: Payer: Self-pay

## 2022-05-04 NOTE — Telephone Encounter (Signed)
   ILR alert received for AF Episode.   1 AF episode of 3 hours 52 minutes duration.  Burden 5.3%.  ECG consistent with true AF.  Average V rate 109 bpm.  [facility] appt pending to discuss [place].  Triaged to [facility]. Follow up as scheduled.    Patient had apt scheduled on 04/25/22, was cancelled, called patient back to make another apt patient stated son was in hospital expressed importance of patient making the apt scheduled for 05/08/22 at 8:30am due to her having more AF and she was high risk for having another stroke patient voiced understanding

## 2022-05-08 ENCOUNTER — Ambulatory Visit (HOSPITAL_COMMUNITY)
Admission: RE | Admit: 2022-05-08 | Discharge: 2022-05-08 | Disposition: A | Discharge status: Home / Self Care | Payer: Medicare PPO | Source: Ambulatory Visit | Attending: Physician Assistant | Admitting: Physician Assistant

## 2022-05-08 ENCOUNTER — Encounter (HOSPITAL_COMMUNITY): Payer: Self-pay | Admitting: Physician Assistant

## 2022-05-08 ENCOUNTER — Encounter (HOSPITAL_COMMUNITY): Payer: Self-pay

## 2022-05-08 VITALS — BP 136/92 | HR 72 | Ht 64.0 in | Wt 218.6 lb

## 2022-05-08 DIAGNOSIS — K7581 Nonalcoholic steatohepatitis (NASH): Secondary | ICD-10-CM | POA: Insufficient documentation

## 2022-05-08 DIAGNOSIS — Z8249 Family history of ischemic heart disease and other diseases of the circulatory system: Secondary | ICD-10-CM | POA: Diagnosis not present

## 2022-05-08 DIAGNOSIS — E119 Type 2 diabetes mellitus without complications: Secondary | ICD-10-CM | POA: Diagnosis not present

## 2022-05-08 DIAGNOSIS — Z8673 Personal history of transient ischemic attack (TIA), and cerebral infarction without residual deficits: Secondary | ICD-10-CM | POA: Diagnosis not present

## 2022-05-08 DIAGNOSIS — Z6837 Body mass index (BMI) 37.0-37.9, adult: Secondary | ICD-10-CM | POA: Diagnosis not present

## 2022-05-08 DIAGNOSIS — Z7985 Long-term (current) use of injectable non-insulin antidiabetic drugs: Secondary | ICD-10-CM | POA: Insufficient documentation

## 2022-05-08 DIAGNOSIS — I480 Paroxysmal atrial fibrillation: Secondary | ICD-10-CM | POA: Insufficient documentation

## 2022-05-08 DIAGNOSIS — G4733 Obstructive sleep apnea (adult) (pediatric): Secondary | ICD-10-CM | POA: Diagnosis not present

## 2022-05-08 DIAGNOSIS — D6869 Other thrombophilia: Secondary | ICD-10-CM | POA: Insufficient documentation

## 2022-05-08 DIAGNOSIS — Z7902 Long term (current) use of antithrombotics/antiplatelets: Secondary | ICD-10-CM | POA: Insufficient documentation

## 2022-05-08 DIAGNOSIS — D696 Thrombocytopenia, unspecified: Secondary | ICD-10-CM | POA: Insufficient documentation

## 2022-05-08 DIAGNOSIS — E669 Obesity, unspecified: Secondary | ICD-10-CM | POA: Diagnosis not present

## 2022-05-08 DIAGNOSIS — M3500 Sicca syndrome, unspecified: Secondary | ICD-10-CM | POA: Insufficient documentation

## 2022-05-08 DIAGNOSIS — Z7982 Long term (current) use of aspirin: Secondary | ICD-10-CM | POA: Insufficient documentation

## 2022-05-08 LAB — CBC
HCT: 44.4 % (ref 36.0–46.0)
Hemoglobin: 14.7 g/dL (ref 12.0–15.0)
MCH: 31.3 pg (ref 26.0–34.0)
MCHC: 33.1 g/dL (ref 30.0–36.0)
MCV: 94.5 fL (ref 80.0–100.0)
Platelets: 67 10*3/uL — ABNORMAL LOW (ref 150–400)
RBC: 4.7 MIL/uL (ref 3.87–5.11)
RDW: 14.7 % (ref 11.5–15.5)
WBC: 2.6 10*3/uL — ABNORMAL LOW (ref 4.0–10.5)
nRBC: 0 % (ref 0.0–0.2)

## 2022-05-08 MED ORDER — NADOLOL 40 MG PO TABS: 40.0000 mg | ORAL_TABLET | Freq: Every day | ORAL | 6 refills | Status: DC

## 2022-05-08 NOTE — Patient Instructions (Signed)
STOP plavix  STOP aspirin  START Eliquis 5mg  twice a day

## 2022-05-08 NOTE — Progress Notes (Signed)
Primary Care Physician: Daisy Floro, MD Primary Cardiologist: Dr Eldridge Dace  Primary Electrophysiologist: Dr Graciela Husbands  Referring Physician: Dr Aniceto Boss Whitney [name] is a 67 y.o. female with a history of NASH cirrhosis, chronic thrombocytopenia treated at [facility], AS s/p tissue AVR 2019, DM2, HLD, Sjogren's syndrome, CVA, atrial fibrillation who presents for follow up in the [facility]. The patient was admitted on 04/13/2022 with imbalance, dipolopia, nausea, and numbness. MRI showed right cerebellum, midbrain, left occipital, right parietal punctate infarcts. No tPA given outside of window. An ILR was placed for cryptogenic stroke. Patient has a CHADS2VASC score of 6. The device clinic received an alert for increased afib burden on ILR. The longest episode lasted for ~ 4 hours. She was unaware of her afib at the time, she was perhaps more fatigued.   Today, she denies symptoms of palpitations, chest pain, shortness of breath, orthopnea, PND, lower extremity edema, dizziness, presyncope, syncope, snoring, daytime somnolence, bleeding, or neurologic sequela. The patient is tolerating medications without difficulties and is otherwise without complaint today.    Atrial Fibrillation Risk Factors:  she does have symptoms or diagnosis of sleep apnea. Not on CPAP post gastric sleeve.  she does have a history of rheumatic fever. she does not have a history of alcohol use.   she has a BMI of Body mass index is 37.52 kg/m.Marland Kitchen Filed Weights   05/08/22 0853  Weight: 99.2 kg    Family History  Problem Relation Age of Onset   Alzheimer's disease Father    Hip fracture Father    Asthma Father    Heart disease Father    Heart attack Father    Hypertension Father    Rheum arthritis Mother    Heart disease Mother    Allergies Daughter    Asthma Paternal Grandmother    Asthma Daughter    Stroke Neg Hx      Atrial Fibrillation Management history:  Previous  antiarrhythmic drugs: none Previous cardioversions: none Previous ablations: none CHADS2VASC score: 6 Anticoagulation history: none   Past Medical History:  Diagnosis Date   Allergic rhinitis    Anemia    hx   Arthritis    Asthma    hx yrs ago   Cirrhosis (HCC) last albumin 3.3 done at [facility] 06-16-2014 (under care everywhere tab in epic)   Secondary to Fatty liver --  followed by hepatology at [facility] (dr Ardine Eng)    Depression    Diabetes mellitus type II    type 2 diet conrolled   Dyspnea    Fibromyalgia    GERD (gastroesophageal reflux disease)    Heart murmur    asymptomatic ---  1989 from rhuematic fever   History of exercise stress test    05-05-2013---  negative bruce ETT given exercise workload,  no ischemia   History of hiatal hernia    History of kidney stones    History of rheumatic fever    1989   Hyperlipidemia    Leukocytopenia    Moderate aortic stenosis    AVA area 1.1cm2---  cardiologist --  dr Lannie Fields, 2014 in epic   NASH (nonalcoholic steatohepatitis)    OSA (obstructive sleep apnea)    was using CPAP before gastric sleeve 2015--  no uses after wt loss   Pneumonia    hx   Sjogren's syndrome (HCC)    Thrombocytopenia (HCC)    Past Surgical History:  Procedure Laterality Date   AORTIC  VALVE REPLACEMENT N/A 12/05/2017   Procedure: AORTIC VALVE REPLACEMENT (AVR) TISSUE VALVE 21MM INSPIRIS;  Surgeon: Gaye Pollack, MD;  Location: [place];  Service: Open Heart Surgery;  Laterality: N/A;   COLONOSCOPY WITH ESOPHAGOGASTRODUODENOSCOPY (EGD)     CYSTOSCOPY WITH RETROGRADE PYELOGRAM, URETEROSCOPY AND STENT PLACEMENT Left 01/21/2015   Procedure: CYSTOSCOPY WITH LEFT  RETROGRADE PYELOGRAM, LEFT URETEROSCOPY AND STENT PLACEMENT;  Surgeon: Festus Aloe, MD;  Location[facility];  Service: Urology;  Laterality: Left;   CYSTOSCOPY WITH RETROGRADE PYELOGRAM, URETEROSCOPY AND STENT PLACEMENT Bilateral 02/24/2015   Procedure: CYSTOSCOPY WITH RIGHT  RETROGRADE PYELOGRAM, BLADDER BIOPSY FULGERATION LEFT URETEROSCOPY AND STENT REPLACEMENT;  Surgeon: Festus Aloe, MD;  Location: [facility];  Service: Urology;  Laterality: Bilateral;   EXPLORATORY LAPAROSCOPY W/  CONE BIOPSY'S LEFT AND RIGHT LOBE OF LIVER  11-04-2007   HOLMIUM LASER APPLICATION Left [id number]   Procedure: HOLMIUM LASER LITHOTRIPSY;  Surgeon: Festus Aloe, MD;  Location[facility];  Service: Urology;  Laterality: Left;   HYSTEROSCOPY WITH D & C N/A 12/11/2012   Procedure: DILATATION AND CURETTAGE /HYSTEROSCOPY;  Surgeon: Maeola Sarah. Landry Mellow, MD;  Location: [facility];  Service: Gynecology;  Laterality: N/A;   INGUINAL HERNIA REPAIR Left 10/22/2016   Procedure: LEFT INGUINAL HERNIA REPAIR;  Surgeon: Rolm Bookbinder, MD;  Location: [place];  Service: General;  Laterality: Left;  TAP BLOCK   INSERTION OF MESH Left 10/22/2016   Procedure: INSERTION OF MESH;  Surgeon: Rolm Bookbinder, MD;  Location: [place];  Service: General;  Laterality: Left;   LAPAROSCOPIC GASTRIC SLEEVE RESECTION  07-27-2013   LOOP RECORDER INSERTION N/A 04/16/2022   Procedure: LOOP RECORDER INSERTION;  Surgeon: Deboraha Sprang, MD;  Location: [place] [facility];  Service: Cardiovascular;  Laterality: N/A;   PUBOVAGINAL SLING  04-10-2001   [place]   RIGHT/LEFT HEART CATH AND CORONARY ANGIOGRAPHY N/A 08/23/2017   Procedure: RIGHT/LEFT HEART CATH AND CORONARY ANGIOGRAPHY;  Surgeon: [name], MD;  Location: [place] [facility];  Service: Cardiovascular;  Laterality: N/A;   TEE WITHOUT CARDIOVERSION N/A 12/05/2017   Procedure: TRANSESOPHAGEAL ECHOCARDIOGRAM (TEE);  Surgeon: Gaye Pollack, MD;  Location: [place];  Service: Open Heart Surgery;  Laterality: N/A;   TONSILLECTOMY  1975   TRANSTHORACIC ECHOCARDIOGRAM  06-04-2012  dr Irish Lack   mild LVH,  grade I diastolic dysfunction/  ef 60-65%/  moderate LAE/  mild MV calcifation without stenosis,  mild MR/  moderate AV stenosis,   cannot r/o bicupsid, area 1.1cm2/  mild dilated aortic root/  trivial TR    Current Outpatient Medications  Medication Sig Dispense Refill   albuterol (VENTOLIN HFA) 108 (90 Base) MCG/ACT inhaler INHALE 2 PUFFS INTO THE LUNGS EVERY 4 HOURS AS NEEDED FOR WHEEZING OR SHORTNESS OF BREATH. 8.5 each 5   amoxicillin (AMOXIL) 500 MG capsule Take 4 capsules 1 hour prior to any dental procedures (Patient taking differently: Take 1,500 mg by mouth once. One hour prior to dental procedures.) 4 capsule 3   aspirin EC 81 MG tablet Take 1 tablet (81 mg total) by mouth daily. Swallow whole. 30 tablet 2   ezetimibe (ZETIA) 10 MG tablet Take 10 mg by mouth at bedtime.   11   magnesium oxide (MAG-OX) 400 MG tablet Take 400 mg by mouth daily.     milk thistle 175 MG tablet Take 175 mg by mouth See admin instructions. Take 175 mg by mouth every 2-3 days     nadolol (CORGARD)  20 MG tablet Take 20 mg by mouth daily.     OZEMPIC, 0.25 OR 0.5 MG/DOSE, 2 MG/3ML SOPN Inject 2 mg into the skin once a week.     STIOLTO RESPIMAT 2.5-2.5 MCG/ACT AERS INHALE 2 PUFFS BY MOUTH INTO THE LUNGS DAILY (Patient taking differently: Inhale 2 each into the lungs daily. INHALE 2 PUFFS BY MOUTH INTO THE LUNGS DAILY) 4 g 5   XIIDRA 5 % SOLN Place 1 drop into both eyes daily.     No current facility-administered medications for this encounter.    Allergies  Allergen Reactions   Doxycycline Hives and Rash   Naproxen Rash and Hives    Social History   Socioeconomic History   Marital status: Widowed    Spouse name: Married to [employer]   Number of children: Not on file   Years of education: Not on file   Highest education level: Not on file  Occupational History   Occupation: principal of [employer]  Tobacco Use   Smoking status: Never   Smokeless tobacco: Never   Tobacco comments:    Never smoke 05/08/22  Vaping Use   Vaping Use: Never used  Substance and Sexual Activity   Alcohol use: Yes    Comment: rarely   Drug  use: No   Sexual activity: Not on file  Other Topics Concern   Not on file  Social History Narrative   Not on file   Social Determinants of Health   Financial Resource Strain: Not on file  Food Insecurity: No Food Insecurity (04/14/2022)   Hunger Vital Sign    Worried About Running Out of Food in the Last Year: Never true    Ran Out of Food in the Last Year: Never true  Transportation Needs: No Transportation Needs (04/14/2022)   PRAPARE - Hydrologist (Medical): No    Lack of Transportation (Non-Medical): No  Physical Activity: Not on file  Stress: Not on file  Social Connections: Not on file  Intimate Partner Violence: Not At Risk (04/14/2022)   Humiliation, Afraid, Rape, and Kick questionnaire    Fear of Current or Ex-Partner: No    Emotionally Abused: No    Physically Abused: No    Sexually Abused: No     ROS- All systems are reviewed and negative except as per the HPI above.  Physical Exam: Vitals:   05/08/22 0853  BP: (!) 136/92  Pulse: 72  Weight: 99.2 kg  Height: 5\' 4"  (1.626 m)    GEN- The patient is a well appearing obese female, alert and oriented x 3 today.   Head- normocephalic, atraumatic Eyes-  Sclera clear, conjunctiva pink Ears- hearing intact Oropharynx- clear Neck- supple  Lungs- Clear to ausculation bilaterally, normal work of breathing Heart- Regular rate and rhythm, no murmurs, rubs or gallops  GI- soft, NT, ND, + BS Extremities- no clubbing, cyanosis, or edema MS- no significant deformity or atrophy Skin- no rash or lesion Psych- euthymic mood, full affect Neuro- strength and sensation are intact  Wt Readings from Last 3 Encounters:  05/08/22 99.2 kg  04/13/22 98 kg  03/21/22 98.5 kg    EKG today demonstrates  SR Vent. rate 72 BPM PR interval 172 ms QRS duration 98 ms QT/QTcB 428/468 ms  Echo 04/14/22 demonstrated   1. Left ventricular ejection fraction, by estimation, is 60 to 65%. The  left  ventricle has normal function. The left ventricle has no regional  wall motion abnormalities.  There is mild concentric left ventricular  hypertrophy. Left ventricular diastolic parameters are consistent with Grade II diastolic dysfunction (pseudonormalization). Elevated left ventricular end-diastolic pressure.   2. Right ventricular systolic function is normal. The right ventricular  size is normal. There is moderately elevated pulmonary artery systolic  pressure.   3. Left atrial size was severely dilated.   4. Right atrial size was severely dilated.   5. Aortic dilatation noted. There is mild dilatation of the ascending  aorta, measuring 41 mm.   6. The mitral valve is degenerative. Mild mitral valve regurgitation.  Mild mitral stenosis. The mean mitral valve gradient is 4.0 mmHg. There is  heavy calcification of the mitral annulus extending into the LV cavity and  involving the submitral  apparatus. There is caseous mitral annular calcification extending into  the basal lateral LV wall.   7. The aortic valve has been repaired/replaced. There is a 21 mm Inspiris  bovine pericardial valve present in the aortic position.      Perivalvular Aortic valve regurgitation is trivial. There is moderate  calcification and thickening of the AV leaflets. Mild aortic stenosis is  present. Aortic valve mean gradient measures 21.0 mmHg. Aortic valve peak gradient measures 37.9 mmHg.  Aortic valve area, by VTI measures 1.91 cm.   8. The inferior vena cava is normal in size with greater than 50%  respiratory variability, suggesting right atrial pressure of 3 mmHg.   9. Compared to study dated 12/26/2018, the is no significant change in  mean AVG or DVI.   Epic records are reviewed at length today  CHA2DS2-VASc Score = 6  The patient's score is based upon: CHF History: 0 HTN History: 0 Diabetes History: 1 Stroke History: 2 Vascular Disease History: 1 (aortic atherosclerosis) Age Score: 1 Gender  Score: 1       ASSESSMENT AND PLAN: 1. Paroxysmal Atrial Fibrillation (ICD10:  I48.0) The patient's CHA2DS2-VASc score is 6, indicating a 9.7% annual risk of stroke.   General education about afib provided and questions answered. We also discussed her stroke risk and the risks and benefits of anticoagulation. ILR shows 0.2% afib burden Will stop ASA and Plavix and start Eliquis 5 mg BID. Check cbc today. Increase nadolol to 40 mg daily  2. Secondary Hypercoagulable State (ICD10:  D68.69) The patient is at significant risk for stroke/thromboembolism based upon her CHA2DS2-VASc Score of 6.  Start Apixaban (Eliquis).   3. Obesity Body mass index is 37.52 kg/m. Lifestyle modification was discussed at length including regular exercise and weight reduction. S/p gastric sleeve.  4. Obstructive sleep apnea Not on CPAP after significant weight loss following gastric sleeve.   5. Chronic thrombocytopenia  Platelets 79 Will need to watch closely on anticoagulation. Recheck cbc today. If her platelets drop below 50, she may not be a candidate for anticoagulation.    Follow up in the AF clinic in one month. Will also have her follow up with her EP Dr Caryl Comes.    [facility] [address] [place],  [address] [phone] 05/08/2022 10:04 AM

## 2022-05-09 ENCOUNTER — Other Ambulatory Visit (HOSPITAL_COMMUNITY): Payer: Self-pay | Admitting: *Deleted

## 2022-05-09 MED ORDER — APIXABAN 5 MG PO TABS: 5.0000 mg | ORAL_TABLET | Freq: Two times a day (BID) | ORAL | 3 refills | Status: DC

## 2022-05-16 NOTE — Progress Notes (Unsigned)
[facility] [address] [place]. [place] [address] [phone]       HOSPITAL FOLLOW UP NOTE  Ms. CHIHIRO FREY Date of Birth:  03/06/1956 Medical Record Number:  [id number]   Reason for Referral:  hospital stroke follow up    SUBJECTIVE:   CHIEF COMPLAINT:  No chief complaint on file.   HPI:   Ms. LAHELA WOODIN is a 67 y.o. female with history of diabetes, cirrhosis, Sjogren's disease, OSA, remote rheumatic fever with AVR who presented to ED on 04/13/2022 with episode of imbalance, diplopia, nausea and numbness.  Stroke workup revealed scattered punctate infarcts of right cerebellum, midbrain, left occipital and right parietal of unclear etiology.  Loop recorder placed to evaluate for A-fib.  CTA head/neck bilateral fetal PCAs.  EF 60 to 65%.  LE Doppler negative for DVT.  LDL 97.  A1c 5.4.  Recommended DAPT for 3 weeks and aspirin alone as well as continuation of Zetia, not a statin candidate due to cirrhosis.  Therapy recommended home health PT.        PERTINENT IMAGING  Per hospitalization 04/13/2022 - *** CT no acute abnormality CTA head and neck bilateral fetal PCAs MRI right cerebellum, midbrain, left occipital, right parietal punctate infarcts 2D Echo EF 60-65% LE venous doppler negative for DVT 10/2020 30-day heart monitoring showed short runs of PACs  LDL 97 HgbA1c 5.4    ROS:   14 system review of systems performed and negative with exception of ***  PMH:  Past Medical History:  Diagnosis Date   Allergic rhinitis    Anemia    hx   Arthritis    Asthma    hx yrs ago   Cirrhosis (HCC) last albumin 3.3 done at [facility] 06-16-2014 (under care everywhere tab in epic)   Secondary to Fatty liver --  followed by hepatology at [facility] (dr Ardine Eng)    Depression    Diabetes mellitus type II    type 2 diet conrolled   Dyspnea    Fibromyalgia    GERD (gastroesophageal reflux disease)    Heart murmur    asymptomatic ---  1989 from rhuematic  fever   History of exercise stress test    05-05-2013---  negative bruce ETT given exercise workload,  no ischemia   History of hiatal hernia    History of kidney stones    History of rheumatic fever    1989   Hyperlipidemia    Leukocytopenia    Moderate aortic stenosis    AVA area 1.1cm2---  cardiologist --  dr Lannie Fields, 2014 in epic   NASH (nonalcoholic steatohepatitis)    OSA (obstructive sleep apnea)    was using CPAP before gastric sleeve 2015--  no uses after wt loss   Pneumonia    hx   Sjogren's syndrome (HCC)    Thrombocytopenia (HCC)     PSH:  Past Surgical History:  Procedure Laterality Date   AORTIC VALVE REPLACEMENT N/A 12/05/2017   Procedure: AORTIC VALVE REPLACEMENT (AVR) TISSUE VALVE INSPIRIS;  Surgeon: Alleen Borne, MD;  Location: [facility];  Service: Open Heart Surgery;  Laterality: N/A;   COLONOSCOPY WITH ESOPHAGOGASTRODUODENOSCOPY (EGD)     CYSTOSCOPY WITH RETROGRADE PYELOGRAM, URETEROSCOPY AND STENT PLACEMENT Left 01/21/2015   Procedure: CYSTOSCOPY WITH LEFT  RETROGRADE PYELOGRAM, LEFT URETEROSCOPY AND STENT PLACEMENT;  Surgeon: Jerilee Field, MD;  Location: [facility];  Service: Urology;  Laterality: Left;   CYSTOSCOPY WITH RETROGRADE PYELOGRAM, URETEROSCOPY AND STENT  PLACEMENT Bilateral 02/24/2015   Procedure: CYSTOSCOPY WITH RIGHT RETROGRADE PYELOGRAM, BLADDER BIOPSY FULGERATION LEFT URETEROSCOPY AND STENT REPLACEMENT;  Surgeon: Festus Aloe, MD;  Location[facility];  Service: Urology;  Laterality: Bilateral;   EXPLORATORY LAPAROSCOPY W/  CONE BIOPSY'S LEFT AND RIGHT LOBE OF LIVER  11-04-2007   HOLMIUM LASER APPLICATION Left 16/11/3708   Procedure: HOLMIUM LASER LITHOTRIPSY;  Surgeon: Festus Aloe, MD;  Location: [facility];  Service: Urology;  Laterality: Left;   HYSTEROSCOPY WITH D & C N/A 12/11/2012   Procedure: DILATATION AND CURETTAGE /HYSTEROSCOPY;  Surgeon: Maeola Sarah. Landry Mellow, MD;  Location: [facility];  Service: Gynecology;  Laterality: N/A;   INGUINAL HERNIA REPAIR Left 10/22/2016   Procedure: LEFT INGUINAL HERNIA REPAIR;  Surgeon: Rolm Bookbinder, MD;  Location: [facility];  Service: General;  Laterality: Left;  TAP BLOCK   INSERTION OF MESH Left 10/22/2016   Procedure: INSERTION OF MESH;  Surgeon: Rolm Bookbinder, MD;  Location: [facility];  Service: General;  Laterality: Left;   LAPAROSCOPIC GASTRIC SLEEVE RESECTION  07-27-2013   LOOP RECORDER INSERTION N/A 04/16/2022   Procedure: LOOP RECORDER INSERTION;  Surgeon: Deboraha Sprang, MD;  Location: [facility];  Service: Cardiovascular;  Laterality: N/A;   PUBOVAGINAL SLING  04-10-2001   [place]   RIGHT/LEFT HEART CATH AND CORONARY ANGIOGRAPHY N/A 08/23/2017   Procedure: RIGHT/LEFT HEART CATH AND CORONARY ANGIOGRAPHY;  Surgeon: [name], MD;  Location: [facility];  Service: Cardiovascular;  Laterality: N/A;   TEE WITHOUT CARDIOVERSION N/A 12/05/2017   Procedure: TRANSESOPHAGEAL ECHOCARDIOGRAM (TEE);  Surgeon: Gaye Pollack, MD;  Location: [facility];  Service: Open Heart Surgery;  Laterality: N/A;   TONSILLECTOMY  1975   TRANSTHORACIC ECHOCARDIOGRAM  06-04-2012  dr Irish Lack   mild LVH,  grade I diastolic dysfunction/  ef 60-65%/  moderate LAE/  mild MV calcifation without stenosis,  mild MR/  moderate AV stenosis,  cannot r/o bicupsid, area 1.1cm2/  mild dilated aortic root/  trivial TR    Social History:  Social History   Socioeconomic History   Marital status: Widowed    Spouse name: Married to [employer]   Number of children: Not on file   Years of education: Not on file   Highest education level: Not on file  Occupational History   Occupation: principal of [employer]  Tobacco Use   Smoking status: Never   Smokeless tobacco: Never   Tobacco comments:    Never smoke 05/08/22  Vaping Use   Vaping Use: Never used  Substance and Sexual Activity   Alcohol use: Yes    Comment: rarely   Drug use: No    Sexual activity: Not on file  Other Topics Concern   Not on file  Social History Narrative   Not on file   Social Determinants of Health   Financial Resource Strain: Not on file  Food Insecurity: No Food Insecurity (04/14/2022)   Hunger Vital Sign    Worried About Running Out of Food in the Last Year: Never true    Ran Out of Food in the Last Year: Never true  Transportation Needs: No Transportation Needs (04/14/2022)   PRAPARE - Hydrologist (Medical): No    Lack of Transportation (Non-Medical): No  Physical Activity: Not on file  Stress: Not on file  Social Connections: Not on file  Intimate Partner Violence: Not At Risk (04/14/2022)   Humiliation, Afraid, Rape, and Kick questionnaire  Fear of Current or Ex-Partner: No    Emotionally Abused: No    Physically Abused: No    Sexually Abused: No    Family History:  Family History  Problem Relation Age of Onset   Alzheimer's disease Father    Hip fracture Father    Asthma Father    Heart disease Father    Heart attack Father    Hypertension Father    Rheum arthritis Mother    Heart disease Mother    Allergies Daughter    Asthma Paternal Grandmother    Asthma Daughter    Stroke Neg Hx     Medications:   Current Outpatient Medications on File Prior to Visit  Medication Sig Dispense Refill   apixaban (ELIQUIS) 5 MG TABS tablet Take 1 tablet (5 mg total) by mouth 2 (two) times daily. 60 tablet 3   albuterol (VENTOLIN HFA) 108 (90 Base) MCG/ACT inhaler INHALE 2 PUFFS INTO THE LUNGS EVERY 4 HOURS AS NEEDED FOR WHEEZING OR SHORTNESS OF BREATH. 8.5 each 5   amoxicillin (AMOXIL) 500 MG capsule Take 4 capsules 1 hour prior to any dental procedures (Patient taking differently: Take 1,500 mg by mouth once. One hour prior to dental procedures.) 4 capsule 3   ezetimibe (ZETIA) 10 MG tablet Take 10 mg by mouth at bedtime.   11   magnesium oxide (MAG-OX) 400 MG tablet Take 400 mg by mouth daily.     milk  thistle 175 MG tablet Take 175 mg by mouth See admin instructions. Take 175 mg by mouth every 2-3 days     nadolol (CORGARD) 40 MG tablet Take 1 tablet (40 mg total) by mouth daily. 30 tablet 6   OZEMPIC, 0.25 OR 0.5 MG/DOSE, 2 MG/3ML SOPN Inject 2 mg into the skin once a week.     STIOLTO RESPIMAT 2.5-2.5 MCG/ACT AERS INHALE 2 PUFFS BY MOUTH INTO THE LUNGS DAILY (Patient taking differently: Inhale 2 each into the lungs daily. INHALE 2 PUFFS BY MOUTH INTO THE LUNGS DAILY) 4 g 5   XIIDRA 5 % SOLN Place 1 drop into both eyes daily.     No current facility-administered medications on file prior to visit.    Allergies:   Allergies  Allergen Reactions   Doxycycline Hives and Rash   Naproxen Rash and Hives      OBJECTIVE:  Physical Exam  There were no vitals filed for this visit. There is no height or weight on file to calculate BMI. No results found.     09/20/2020   11:00 AM  Depression screen PHQ 2/9  Decreased Interest 1  Down, Depressed, Hopeless 0  PHQ - 2 Score 1  Altered sleeping 1  Tired, decreased energy 2  Change in appetite 0  Feeling bad or failure about yourself  0  Trouble concentrating 0  Moving slowly or fidgety/restless 0  Suicidal thoughts 0  PHQ-9 Score 4  Difficult doing work/chores Somewhat difficult     General: well developed, well nourished, seated, in no evident distress Head: head normocephalic and atraumatic.   Neck: supple with no carotid or supraclavicular bruits Cardiovascular: regular rate and rhythm, no murmurs Musculoskeletal: no deformity Skin:  no rash/petichiae Vascular:  Normal pulses all extremities   Neurologic Exam Mental Status: Awake and fully alert. Oriented to place and time. Recent and remote memory intact. Attention span, concentration and fund of knowledge appropriate. Mood and affect appropriate.  Cranial Nerves: Fundoscopic exam reveals sharp disc margins. Pupils equal, briskly reactive  to light. Extraocular movements  full without nystagmus. Visual fields full to confrontation. Hearing intact. Facial sensation intact. Face, tongue, palate moves normally and symmetrically.  Motor: Normal bulk and tone. Normal strength in all tested extremity muscles Sensory.: intact to touch , pinprick , position and vibratory sensation.  Coordination: Rapid alternating movements normal in all extremities. Finger-to-nose and heel-to-shin performed accurately bilaterally. Gait and Station: Arises from chair without difficulty. Stance is normal. Gait demonstrates normal stride length and balance with ***. Tandem walk and heel toe ***.  Reflexes: 1+ and symmetric. Toes downgoing.     NIHSS  *** Modified Rankin  ***      ASSESSMENT: RAYNESHA TIEDT is a 67 y.o. year old female with scattered punctate infarcts at right cerebellum, midbrain, left occipital and right parietal on 04/13/2022 of unclear etiology s/p ILR. Vascular risk factors include DM, HLD, hx of arrhythmia with postop AVR A-fib but 30 day monitor no evidence of A-fib, advanced age and OSA.      PLAN:  Cryptogenic stroke:  Residual deficit: ***.  Continue aspirin 81mg  daily and Zetia for secondary stroke prevention.   Discussed secondary stroke prevention measures and importance of close PCP follow up for aggressive stroke risk factor management including BP goal<130/90, HLD with LDL goal<70 and DM with A1c.<7 .  Stroke labs 04/2022: LDL 97, A1c 5.4 I have gone over the pathophysiology of stroke, warning signs and symptoms, risk factors and their management in some detail with instructions to go to the closest emergency room for symptoms of concern.     Follow up in *** or call earlier if needed   CC:  [facility] provider: Dr. 05/2022 PCP: Pearlean Brownie, MD    I spent *** minutes of face-to-face and non-face-to-face time with patient.  This included previsit chart review including review of recent hospitalization, lab review, study review, order entry,  electronic health record documentation, patient education regarding recent stroke including etiology, secondary stroke prevention measures and importance of managing stroke risk factors, residual deficits and typical recovery time and answered all other questions to patient satisfaction   Daisy Floro, AGNP-BC  [facility] [address] [place], [place]  Phone [phone] Fax [phone] Note: This document was prepared with digital dictation and possible smart phrase technology. Any transcriptional errors that result from this process are unintentional.

## 2022-05-17 ENCOUNTER — Ambulatory Visit: Payer: Medicare PPO | Admitting: Adult Health

## 2022-05-17 ENCOUNTER — Encounter: Payer: Self-pay | Admitting: Adult Health

## 2022-05-17 VITALS — BP 133/80 | HR 72 | Ht 64.0 in | Wt 218.0 lb

## 2022-05-17 DIAGNOSIS — I639 Cerebral infarction, unspecified: Secondary | ICD-10-CM | POA: Diagnosis not present

## 2022-05-17 DIAGNOSIS — Z09 Encounter for follow-up examination after completed treatment for conditions other than malignant neoplasm: Secondary | ICD-10-CM

## 2022-05-17 NOTE — Patient Instructions (Addendum)
Continue to do exercises at home as advised by home health therapy, If you would like additional therapy please let me know  Continue Eliquis 5 mg twice daily and Zetia for secondary stroke prevention  Continue to follow with cardiology for atrial fibrillation and Eliquis management  Continue to follow up with PCP regarding blood pressure, diabetes and cholesterol management  Maintain strict control of hypertension with blood pressure goal below 130/90, diabetes management with A1c goal <7 and cholesterol with LDL cholesterol (bad cholesterol) goal below 70 mg/dL.   Signs of a Stroke? Follow the BEFAST method:  Balance Watch for a sudden loss of balance, trouble with coordination or vertigo Eyes Is there a sudden loss of vision in one or both eyes? Or double vision?  Face: Ask the person to smile. Does one side of the face droop or is it numb?  Arms: Ask the person to raise both arms. Does one arm drift downward? Is there weakness or numbness of a leg? Speech: Ask the person to repeat a simple phrase. Does the speech sound slurred/strange? Is the person confused ? Time: If you observe any of these signs, call 911.  If you would like to be further evaluated, please have Dr. Harrington Challenger refer you back to our office to be seen by one of our neurologist for further evaluation      Thank you for coming to see Whitney [name] at [facility]. I hope we have been able to provide you high quality care today.  You may receive a patient satisfaction survey over the next few weeks. We would appreciate your feedback and comments so that we may continue to improve ourselves and the health of our patients.    Stroke Prevention Some medical conditions and lifestyle choices can lead to a higher risk for a stroke. You can help to prevent a stroke by eating healthy foods and exercising. It also helps to not smoke and to manage any health problems you may have. How can this condition affect me? A stroke is  an emergency. It should be treated right away. A stroke can lead to brain damage or threaten your life. There is a better chance of surviving and getting better after a stroke if you get medical help right away. What can increase my risk? The following medical conditions may increase your risk of a stroke: Diseases of the heart and blood vessels (cardiovascular disease). High blood pressure (hypertension). Diabetes. High cholesterol. Sickle cell disease. Problems with blood clotting. Being very overweight. Sleeping problems (obstructivesleep apnea). Other risk factors include: Being older than age 25. A history of blood clots, stroke, or mini-stroke (TIA). Race, ethnic background, or a family history of stroke. Smoking or using tobacco products. Taking birth control pills, especially if you smoke. Heavy alcohol and drug use. Not being active. What actions can I take to prevent this? Manage your health conditions High cholesterol. Eat a healthy diet. If this is not enough to manage your cholesterol, you may need to take medicines. Take medicines as told by your doctor. High blood pressure. Try to keep your blood pressure below 130/80. If your blood pressure cannot be managed through a healthy diet and regular exercise, you may need to take medicines. Take medicines as told by your doctor. Ask your doctor if you should check your blood pressure at home. Have your blood pressure checked every year. Diabetes. Eat a healthy diet and get regular exercise. If your blood sugar (glucose) cannot be managed through diet and  exercise, you may need to take medicines. Take medicines as told by your doctor. Talk to your doctor about getting checked for sleeping problems. Signs of a problem can include: Snoring a lot. Feeling very tired. Make sure that you manage any other conditions you have. Nutrition  Follow instructions from your doctor about what to eat or drink. You may be told to: Eat  and drink fewer calories each day. Limit how much salt (sodium) you use to 1,500 milligrams (mg) each day. Use only healthy fats for cooking, such as olive oil, canola oil, and sunflower oil. Eat healthy foods. To do this: Choose foods that are high in fiber. These include whole grains, and fresh fruits and vegetables. Eat at least 5 servings of fruits and vegetables a day. Try to fill one-half of your plate with fruits and vegetables at each meal. Choose low-fat (lean) proteins. These include low-fat cuts of meat, chicken without skin, fish, tofu, beans, and nuts. Eat low-fat dairy products. Avoid foods that: Are high in salt. Have saturated fat. Have trans fat. Have cholesterol. Are processed or pre-made. Count how many carbohydrates you eat and drink each day. Lifestyle If you drink alcohol: Limit how much you have to: 0-1 drink a day for women who are not pregnant. 0-2 drinks a day for men. Know how much alcohol is in your drink. In the [place] one drink equals one 12 oz bottle of beer ( ), one 5 oz glass of wine ( ), or one 1 oz glass of hard liquor (80mL). Do not smoke or use any products that have nicotine or tobacco. If you need help quitting, ask your doctor. Avoid secondhand smoke. Do not use drugs. Activity  Try to stay at a healthy weight. Get at least 30 minutes of exercise on most days, such as: Fast walking. Biking. Swimming. Medicines Take over-the-counter and prescription medicines only as told by your doctor. Avoid taking birth control pills. Talk to your doctor about the risks of taking birth control pills if: You are over 5 years old. You smoke. You get very bad headaches. You have had a blood clot. Where to find more information American Stroke Association: www.strokeassociation.org Get help right away if: You or a loved one has any signs of a stroke. "BE FAST" is an easy way to remember the warning signs: B - Balance. Dizziness, sudden trouble  walking, or loss of balance. E - Eyes. Trouble seeing or a change in how you see. F - Face. Sudden weakness or loss of feeling of the face. The face or eyelid may droop on one side. A - Arms. Weakness or loss of feeling in an arm. This happens all of a sudden and most often on one side of the body. S - Speech. Sudden trouble speaking, slurred speech, or trouble understanding what people say. T - Time. Time to call emergency services. Write down what time symptoms started. You or a loved one has other signs of a stroke, such as: A sudden, very bad headache with no known cause. Feeling like you may vomit (nausea). Vomiting. A seizure. These symptoms may be an emergency. Get help right away. Call your local emergency services (911 in the [place] Do not wait to see if the symptoms will go away. Do not drive yourself to the hospital. Summary You can help to prevent a stroke by eating healthy, exercising, and not smoking. It also helps to manage any health problems you have. Do not smoke or use any products that contain  nicotine or tobacco. Get help right away if you or a loved one has any signs of a stroke. This information is not intended to replace advice given to you by your health care provider. Make sure you discuss any questions you have with your health care provider. Document Revised: 11/23/2019 Document Reviewed: 11/23/2019 Elsevier Patient Education  [place].

## 2022-05-17 NOTE — Progress Notes (Signed)
I agree with the above plan 

## 2022-05-21 ENCOUNTER — Ambulatory Visit (INDEPENDENT_AMBULATORY_CARE_PROVIDER_SITE_OTHER): Payer: Medicare PPO

## 2022-05-21 DIAGNOSIS — I480 Paroxysmal atrial fibrillation: Secondary | ICD-10-CM

## 2022-05-22 LAB — CUP PACEART REMOTE DEVICE CHECK
Date Time Interrogation Session: 20240114005200
Implantable Pulse Generator Implant Date: 20231211

## 2022-05-23 ENCOUNTER — Encounter (HOSPITAL_COMMUNITY): Payer: Self-pay

## 2022-05-23 ENCOUNTER — Emergency Department (HOSPITAL_COMMUNITY): Payer: Medicare PPO

## 2022-05-23 ENCOUNTER — Emergency Department (HOSPITAL_COMMUNITY)
Admission: EM | Admit: 2022-05-23 | Discharge: 2022-05-23 | Disposition: A | Discharge status: Home / Self Care | Payer: Medicare PPO | Attending: Emergency Medicine | Admitting: Emergency Medicine

## 2022-05-23 ENCOUNTER — Other Ambulatory Visit: Payer: Self-pay

## 2022-05-23 DIAGNOSIS — R519 Headache, unspecified: Secondary | ICD-10-CM | POA: Insufficient documentation

## 2022-05-23 DIAGNOSIS — R410 Disorientation, unspecified: Secondary | ICD-10-CM | POA: Insufficient documentation

## 2022-05-23 DIAGNOSIS — R4781 Slurred speech: Secondary | ICD-10-CM | POA: Insufficient documentation

## 2022-05-23 DIAGNOSIS — R200 Anesthesia of skin: Secondary | ICD-10-CM | POA: Insufficient documentation

## 2022-05-23 DIAGNOSIS — R4701 Aphasia: Secondary | ICD-10-CM | POA: Diagnosis not present

## 2022-05-23 DIAGNOSIS — Z79899 Other long term (current) drug therapy: Secondary | ICD-10-CM | POA: Diagnosis not present

## 2022-05-23 DIAGNOSIS — G459 Transient cerebral ischemic attack, unspecified: Secondary | ICD-10-CM | POA: Diagnosis not present

## 2022-05-23 DIAGNOSIS — I4891 Unspecified atrial fibrillation: Secondary | ICD-10-CM | POA: Diagnosis not present

## 2022-05-23 DIAGNOSIS — Z8673 Personal history of transient ischemic attack (TIA), and cerebral infarction without residual deficits: Secondary | ICD-10-CM | POA: Diagnosis not present

## 2022-05-23 DIAGNOSIS — Z7901 Long term (current) use of anticoagulants: Secondary | ICD-10-CM | POA: Insufficient documentation

## 2022-05-23 DIAGNOSIS — I480 Paroxysmal atrial fibrillation: Secondary | ICD-10-CM | POA: Diagnosis not present

## 2022-05-23 DIAGNOSIS — I10 Essential (primary) hypertension: Secondary | ICD-10-CM | POA: Diagnosis not present

## 2022-05-23 DIAGNOSIS — R42 Dizziness and giddiness: Secondary | ICD-10-CM | POA: Diagnosis not present

## 2022-05-23 LAB — I-STAT CHEM 8, ED
BUN: 14 mg/dL (ref 8–23)
Calcium, Ion: 1.09 mmol/L — ABNORMAL LOW (ref 1.15–1.40)
Chloride: 102 mmol/L (ref 98–111)
Creatinine, Ser: 0.7 mg/dL (ref 0.44–1.00)
Glucose, Bld: 112 mg/dL — ABNORMAL HIGH (ref 70–99)
HCT: 42 % (ref 36.0–46.0)
Hemoglobin: 14.3 g/dL (ref 12.0–15.0)
Potassium: 4.1 mmol/L (ref 3.5–5.1)
Sodium: 138 mmol/L (ref 135–145)
TCO2: 26 mmol/L (ref 22–32)

## 2022-05-23 LAB — DIFFERENTIAL
Abs Immature Granulocytes: 0 10*3/uL (ref 0.00–0.07)
Basophils Absolute: 0 10*3/uL (ref 0.0–0.1)
Basophils Relative: 0 %
Eosinophils Absolute: 0.1 10*3/uL (ref 0.0–0.5)
Eosinophils Relative: 4 %
Immature Granulocytes: 0 %
Lymphocytes Relative: 39 %
Lymphs Abs: 0.9 10*3/uL (ref 0.7–4.0)
Monocytes Absolute: 0.3 10*3/uL (ref 0.1–1.0)
Monocytes Relative: 13 %
Neutro Abs: 1 10*3/uL — ABNORMAL LOW (ref 1.7–7.7)
Neutrophils Relative %: 44 %

## 2022-05-23 LAB — CBG MONITORING, ED: Glucose-Capillary: 133 mg/dL — ABNORMAL HIGH (ref 70–99)

## 2022-05-23 LAB — COMPREHENSIVE METABOLIC PANEL
ALT: 13 U/L (ref 0–44)
AST: 31 U/L (ref 15–41)
Albumin: 2.7 g/dL — ABNORMAL LOW (ref 3.5–5.0)
Alkaline Phosphatase: 63 U/L (ref 38–126)
Anion gap: 9 (ref 5–15)
BUN: 12 mg/dL (ref 8–23)
CO2: 26 mmol/L (ref 22–32)
Calcium: 8.5 mg/dL — ABNORMAL LOW (ref 8.9–10.3)
Chloride: 102 mmol/L (ref 98–111)
Creatinine, Ser: 0.79 mg/dL (ref 0.44–1.00)
GFR, Estimated: >60 mL/min (ref >60)
Glucose, Bld: 115 mg/dL — ABNORMAL HIGH (ref 70–99)
Potassium: 4.1 mmol/L (ref 3.5–5.1)
Sodium: 137 mmol/L (ref 135–145)
Total Bilirubin: 1 mg/dL (ref 0.3–1.2)
Total Protein: 5.9 g/dL — ABNORMAL LOW (ref 6.5–8.1)

## 2022-05-23 LAB — CBC
HCT: 41.9 % (ref 36.0–46.0)
Hemoglobin: 13.6 g/dL (ref 12.0–15.0)
MCH: 30.7 pg (ref 26.0–34.0)
MCHC: 32.5 g/dL (ref 30.0–36.0)
MCV: 94.6 fL (ref 80.0–100.0)
Platelets: 53 10*3/uL — ABNORMAL LOW (ref 150–400)
RBC: 4.43 MIL/uL (ref 3.87–5.11)
RDW: 14.5 % (ref 11.5–15.5)
WBC: 2.4 10*3/uL — ABNORMAL LOW (ref 4.0–10.5)
nRBC: 0 % (ref 0.0–0.2)

## 2022-05-23 LAB — PROTIME-INR
INR: 1.6 — ABNORMAL HIGH (ref 0.8–1.2)
Prothrombin Time: 18.6 seconds — ABNORMAL HIGH (ref 11.4–15.2)

## 2022-05-23 LAB — ETHANOL: Alcohol, Ethyl (B): <10 mg/dL (ref <10)

## 2022-05-23 LAB — APTT: aPTT: 43 seconds — ABNORMAL HIGH (ref 24–36)

## 2022-05-23 MED ORDER — SODIUM CHLORIDE 0.9% FLUSH: 3.0000 mL | Freq: Once | INTRAVENOUS | Status: DC

## 2022-05-23 NOTE — Consult Note (Signed)
NEUROLOGY CONSULTATION NOTE   Date of service: May 23, 2022 Patient Name: Whitney [name] MRN:  [id number] DOB:  [birth year] Reason for consult: stroke code Requesting physician: Dr. Alvester Chou _ _ _   _ __   _ __ _ _  __ __   _ __   __ _  History of Present Illness   This is a 67 year old woman with a past medical history significant for Whitney [name], cirrhosis, diabetes, hyperlipidemia obstructive sleep apnea, thrombocytopenia, recent admission for embolic stroke in December 2023, new diagnosis of A-fib captured on loop recorder this month started on Eliquis less than 1 week ago who presents after transient episode of aphasia, now resolved.  She is followed by Dr. Pearlean Brownie as an outpatient and was last seen by him 05/17/22.  Family member (MD pediatric resident) at bedside states that when she spoke to her on the phone today she was confused, aphasic, speaking slowly and had difficulty finding her words.  The symptoms have now resolved and both feel that her speech is normal.  Stroke code was called due to concern for minimal left upper extremity weakness.  NIH stroke scale was 1.  CT head showed no acute intracranial process on personal review.  Patient was not a TNK candidate due to therapeutic anticoagulation with Eliquis as well as resolution of acute symptoms.  CTA was not performed because exam was not consistent with LVO.  Regarding her recent stroke history she presented to the ED on April 13, 2022 with an episode of imbalance, diplopia, nausea, and numbness.  Stroke workup revealed scattered punctate infarcts of the right cerebellum, midbrain, left occipital, and right parietal regions.  CTA showed bilateral fetal PCAs and was otherwise unremarkable.  TTE showed normal EF with no intracardiac clot.  Bilateral lower extremity Dopplers were negative for DVT.  Loop recorder was placed to evaluate for A-fib.  Patient was discharged on dual antiplatelet therapy for 3 weeks followed by aspirin alone  and continuation of Zetia as she was on a statin candidate due to cirrhosis.  Loop recorder after discharge did show evidence of A-fib.  DAPT was discontinued and she was started on Eliquis 5 mg twice daily by cardiology after she was noted to have a CHA2DS2-VASc score of 6.  She states that her first dose of Eliquis was last week.  She was also seen by neurology in clinic last week and reported at that time she been doing well since discharge with occasional dizziness and unsteadiness upon standing.   ROS   Per HPI: all other systems reviewed and are negative  Past History   I have reviewed the following:  Past Medical History:  Diagnosis Date   Allergic rhinitis    Anemia    hx   Arthritis    Asthma    hx yrs ago   Cirrhosis (HCC) last albumin 3.3 done at [facility] 06-16-2014 (under care everywhere tab in epic)   Secondary to Fatty liver --  followed by hepatology at [facility] (dr Ardine Eng)    Depression    Diabetes mellitus type II    type 2 diet conrolled   Dyspnea    Fibromyalgia    GERD (gastroesophageal reflux disease)    Heart murmur    asymptomatic ---  1989 from rhuematic fever   History of exercise stress test    05-05-2013---  negative bruce ETT given exercise workload,  no ischemia   History of hiatal hernia    History of kidney stones  History of rheumatic fever    1989   Hyperlipidemia    Leukocytopenia    Moderate aortic stenosis    AVA area 1.1cm2---  cardiologist --  dr Concepcion Living, 2014 in epic   NASH (nonalcoholic steatohepatitis)    OSA (obstructive sleep apnea)    was using CPAP before gastric sleeve 2015--  no uses after wt loss   Pneumonia    hx   Sjogren's syndrome ([place])    Thrombocytopenia ([place])    Past Surgical History:  Procedure Laterality Date   AORTIC VALVE REPLACEMENT N/A 12/05/2017   Procedure: AORTIC VALVE REPLACEMENT (AVR) TISSUE VALVE 21MM INSPIRIS;  Surgeon: Gaye Pollack, MD;  Location: [facility];  Service: Open Heart Surgery;  Laterality:  N/A;   COLONOSCOPY WITH ESOPHAGOGASTRODUODENOSCOPY (EGD)     CYSTOSCOPY WITH RETROGRADE PYELOGRAM, URETEROSCOPY AND STENT PLACEMENT Left 01/21/2015   Procedure: CYSTOSCOPY WITH LEFT  RETROGRADE PYELOGRAM, LEFT URETEROSCOPY AND STENT PLACEMENT;  Surgeon: Festus Aloe, MD;  Location: [facility];  Service: Urology;  Laterality: Left;   CYSTOSCOPY WITH RETROGRADE PYELOGRAM, URETEROSCOPY AND STENT PLACEMENT Bilateral 02/24/2015   Procedure: CYSTOSCOPY WITH RIGHT RETROGRADE PYELOGRAM, BLADDER BIOPSY FULGERATION LEFT URETEROSCOPY AND STENT REPLACEMENT;  Surgeon: Festus Aloe, MD;  Location: [facility];  Service: Urology;  Laterality: Bilateral;   EXPLORATORY LAPAROSCOPY W/  CONE BIOPSY'S LEFT AND RIGHT LOBE OF LIVER  11-04-2007   HOLMIUM LASER APPLICATION Left 59/56/3875   Procedure: HOLMIUM LASER LITHOTRIPSY;  Surgeon: Festus Aloe, MD;  Location: [facility];  Service: Urology;  Laterality: Left;   HYSTEROSCOPY WITH D & C N/A 12/11/2012   Procedure: DILATATION AND CURETTAGE /HYSTEROSCOPY;  Surgeon: Maeola Sarah. Landry Mellow, MD;  Location: [facility];  Service: Gynecology;  Laterality: N/A;   INGUINAL HERNIA REPAIR Left 10/22/2016   Procedure: LEFT INGUINAL HERNIA REPAIR;  Surgeon: Rolm Bookbinder, MD;  Location: [place] [facility];  Service: General;  Laterality: Left;  TAP BLOCK   INSERTION OF MESH Left 10/22/2016   Procedure: INSERTION OF MESH;  Surgeon: Rolm Bookbinder, MD;  Location: [place] [facility];  Service: General;  Laterality: Left;   LAPAROSCOPIC GASTRIC SLEEVE RESECTION  07-27-2013   LOOP RECORDER INSERTION N/A 04/16/2022   Procedure: LOOP RECORDER INSERTION;  Surgeon: Deboraha Sprang, MD;  Location: [facility];  Service: Cardiovascular;  Laterality: N/A;   PUBOVAGINAL SLING  04-10-2001   [place]   RIGHT/LEFT HEART CATH AND CORONARY ANGIOGRAPHY N/A 08/23/2017   Procedure: RIGHT/LEFT HEART CATH AND CORONARY ANGIOGRAPHY;  Surgeon: [name], MD;   Location: [facility];  Service: Cardiovascular;  Laterality: N/A;   TEE WITHOUT CARDIOVERSION N/A 12/05/2017   Procedure: TRANSESOPHAGEAL ECHOCARDIOGRAM (TEE);  Surgeon: Gaye Pollack, MD;  Location: [place];  Service: Open Heart Surgery;  Laterality: N/A;   TONSILLECTOMY  1975   TRANSTHORACIC ECHOCARDIOGRAM  06-04-2012  dr Irish Lack   mild LVH,  grade I diastolic dysfunction/  ef 60-65%/  moderate LAE/  mild MV calcifation without stenosis,  mild MR/  moderate AV stenosis,  cannot r/o bicupsid, area 1.1cm2/  mild dilated aortic root/  trivial TR   Family History  Problem Relation Age of Onset   Alzheimer's disease Father    Hip fracture Father    Asthma Father    Heart disease Father    Heart attack Father    Hypertension Father    Rheum arthritis Mother    Heart disease Mother    Allergies Daughter  Asthma Paternal Grandmother    Asthma Daughter    Stroke Neg Hx    Social History   Socioeconomic History   Marital status: Widowed    Spouse name: Married to [employer]   Number of children: Not on file   Years of education: Not on file   Highest education level: Not on file  Occupational History   Occupation: principal of [employer]  Tobacco Use   Smoking status: Never   Smokeless tobacco: Never   Tobacco comments:    Never smoke 05/08/22  Vaping Use   Vaping Use: Never used  Substance and Sexual Activity   Alcohol use: Yes    Comment: rarely   Drug use: No   Sexual activity: Not on file  Other Topics Concern   Not on file  Social History Narrative   Not on file   Social Determinants of Health   Financial Resource Strain: Not on file  Food Insecurity: No Food Insecurity (04/14/2022)   Hunger Vital Sign    Worried About Running Out of Food in the Last Year: Never true    Ran Out of Food in the Last Year: Never true  Transportation Needs: No Transportation Needs (04/14/2022)   PRAPARE - Administrator, Civil Service (Medical): No    Lack of  Transportation (Non-Medical): No  Physical Activity: Not on file  Stress: Not on file  Social Connections: Not on file   Allergies  Allergen Reactions   Doxycycline Hives and Rash   Naproxen Rash and Hives    Medications   (Not in a hospital admission)     Current Facility-Administered Medications:    sodium chloride flush (NS) 0.9 % injection 3 mL, 3 mL, Intravenous, Once, Clark, Meghan R, PA  Current Outpatient Medications:    albuterol (VENTOLIN HFA) 108 (90 Base) MCG/ACT inhaler, INHALE 2 PUFFS INTO THE LUNGS EVERY 4 HOURS AS NEEDED FOR WHEEZING OR SHORTNESS OF BREATH., Disp: 8.5 each, Rfl: 5   amoxicillin (AMOXIL) 500 MG capsule, Take 4 capsules 1 hour prior to any dental procedures (Patient taking differently: Take 1,500 mg by mouth once. One hour prior to dental procedures.), Disp: 4 capsule, Rfl: 3   apixaban (ELIQUIS) 5 MG TABS tablet, Take 1 tablet (5 mg total) by mouth 2 (two) times daily., Disp: 60 tablet, Rfl: 3   ezetimibe (ZETIA) 10 MG tablet, Take 10 mg by mouth at bedtime. , Disp: , Rfl: 11   magnesium oxide (MAG-OX) 400 MG tablet, Take 400 mg by mouth daily., Disp: , Rfl:    milk thistle 175 MG tablet, Take 175 mg by mouth See admin instructions. Take 175 mg by mouth every 2-3 days, Disp: , Rfl:    nadolol (CORGARD) 40 MG tablet, Take 1 tablet (40 mg total) by mouth daily., Disp: 30 tablet, Rfl: 6   OZEMPIC, 0.25 OR 0.5 MG/DOSE, 2 MG/3ML SOPN, Inject 2 mg into the skin once a week., Disp: , Rfl:    STIOLTO RESPIMAT 2.5-2.5 MCG/ACT AERS, INHALE 2 PUFFS BY MOUTH INTO THE LUNGS DAILY (Patient taking differently: Inhale 2 each into the lungs daily. INHALE 2 PUFFS BY MOUTH INTO THE LUNGS DAILY), Disp: 4 g, Rfl: 5   XIIDRA 5 % SOLN, Place 1 drop into both eyes daily., Disp: , Rfl:   Vitals   Vitals:   05/23/22 1812 05/23/22 1943  BP: (!) 163/86 (!) 147/84  Pulse: 71 71  Resp: 19 15  Temp: 98 F (36.7 C)  TempSrc: Oral   SpO2: 99% 99%     There is no height or  weight on file to calculate BMI.  Physical Exam   Physical Exam Gen: A&O x4, NAD HEENT: Atraumatic, normocephalic;mucous membranes moist; oropharynx clear, tongue without atrophy or fasciculations. Neck: Supple, trachea midline. Resp: CTAB, no w/r/r CV: RRR, no m/g/r; nml S1 and S2. 2+ symmetric peripheral pulses. Abd: soft/NT/ND; nabs x 4 quad Extrem: Nml bulk; no cyanosis, clubbing, or edema.  Neuro: *MS: A&O x4. Follows multi-step commands.  *Speech: fluid, nondysarthric, able to name and repeat *CN:    I: Deferred   II,III: PERRLA, VFF by confrontation, optic discs unable to be visualized 2/2 pupillary constriction   III,IV,VI: EOMI w/o nystagmus, no ptosis   V: Sensation intact from V1 to V3 to LT   VII: Eyelid closure was full.  Smile symmetric.   VIII: Hearing intact to voice   IX,X: Voice normal, palate elevates symmetrically    XI: SCM/trap 5/5 bilat   XII: Tongue protrudes midline, no atrophy or fasciculations  *Motor:   Normal bulk.  No tremor, rigidity or bradykinesia. Minimal LUE drift, otherwise full strength throughout *Sensory: SILT. No double-simultaneous extinction.  *Coordination:  Finger-to-nose, heel-to-shin, rapid alternating motions were intact. *Reflexes:  2+ and symmetric throughout without clonus; toes down-going bilat *Gait: deferred  NIHSS = 1 LUE drift   Premorbid mRS = 2   Labs   CBC:  Recent Labs  Lab 05/23/22 1856  HGB 14.3  HCT 69.6    Basic Metabolic Panel:  Lab Results  Component Value Date   NA 138 05/23/2022   K 4.1 05/23/2022   CO2 25 04/15/2022   GLUCOSE 112 (H) 05/23/2022   BUN 14 05/23/2022   CREATININE 0.70 05/23/2022   CALCIUM 8.0 (L) 04/15/2022   GFRNONAA >60 04/15/2022   GFRAA >60 12/17/2018   Lipid Panel:  Lab Results  Component Value Date   LDLCALC 97 04/14/2022   HgbA1c:  Lab Results  Component Value Date   HGBA1C 5.4 04/13/2022   Urine Drug Screen:     Component Value Date/Time   LABOPIA NONE  DETECTED 04/13/2022 1651   COCAINSCRNUR NONE DETECTED 04/13/2022 1651   LABBENZ NONE DETECTED 04/13/2022 1651   AMPHETMU NONE DETECTED 04/13/2022 1651   THCU NONE DETECTED 04/13/2022 1651   LABBARB NONE DETECTED 04/13/2022 1651    Alcohol Level     Component Value Date/Time   ETH <10 04/13/2022 2235    Impression   This is a 67 year old woman with a past medical history significant for [place], cirrhosis, diabetes, hyperlipidemia obstructive sleep apnea, thrombocytopenia, recent admission for embolic stroke in December 2023, new diagnosis of A-fib captured on loop recorder this month started on Eliquis less than 1 week ago who presents after transient episode of aphasia, now resolved.  She is followed by Dr. Leonie Man as an outpatient.  Stroke code was called due to concern for minimal left upper extremity weakness.  NIH stroke scale was 1.  CT head showed no acute intracranial process on personal review.  Patient was not a TNK candidate due to therapeutic anticoagulation with Eliquis as well as resolution of acute symptoms.  CTA was not performed because exam was not consistent with LVO.  She had a complete stroke workup one month ago including placement of a loop recorder which revealed a fib. She was started on eliquis one week ago. We will obtain MRI brain to clarify if she had a TIA or a new small  stroke. This can be performed in the ED; there is no indication for admission for stroke workup bc she had complete stroke workup last month and is now optimized on eliquis and zetia. Because she was just started on eliquis this would not be considered an eliquis failure and there is no indication to switch to other DOAC at this time.  Recommendations   - MRI brain wo contrast in ED; touch base with neurology after this is completed - Continue current regimen including eliquis and zetia - No indication for admission, patient is already optimized from stroke standpoint - F/u with Dr. Pearlean Brownie  outpatient ______________________________________________________________________   Thank you for the opportunity to take part in the care of this patient. If you have any further questions, please contact the neurology consultation attending.  Signed,  Bing Neighbors, MD [facility] [phone]  If 7pm- 7am, please page neurology on call as listed in AMION.  **Any copied and pasted documentation in this note was written by me in another application not billed for and pasted by me into this document.

## 2022-05-23 NOTE — Discharge Instructions (Addendum)
Please follow up with your neurologist as an outpatient for these episodes.  Make sure you are eating regular meals.  Call 911 if you experience any new or returning stroke-like symptoms.

## 2022-05-23 NOTE — ED Provider Notes (Signed)
[facility] EMERGENCY DEPARTMENT Provider Note   CSN: [id number] Arrival date & time: 05/23/22  1758     History  Chief Complaint  Patient presents with   Stroke-like s/s    Whitney [name] is a 67 y.o. female eliquis for A Fib, AVR, hx of embolic stroke Dec 1964 presenting to ED with transient behavioral change, slurred speech, headache, facial numbness.  Daughter reports [place] at 4 pm today.  Patient found with slurred speech afterwards, BP 180/110, glucose 70.  Given juice at home.  In ED patient is asymptomatic aside from continued lightheadedness.     Patient started on eliquis very recently with new A Fib, has loop recorder.     HPI     Home Medications Prior to Admission medications   Medication Sig Start Date End Date Taking? Authorizing Provider  albuterol (VENTOLIN HFA) 108 (90 Base) MCG/ACT inhaler INHALE 2 PUFFS INTO THE LUNGS EVERY 4 HOURS AS NEEDED FOR WHEEZING OR SHORTNESS OF BREATH. 04/17/22   Olalere, Adewale A, MD  amoxicillin (AMOXIL) 500 MG capsule Take 4 capsules 1 hour prior to any dental procedures Patient taking differently: Take 1,500 mg by mouth once. One hour prior to dental procedures. 09/03/19   [name], MD  apixaban (ELIQUIS) 5 MG TABS tablet Take 1 tablet (5 mg total) by mouth 2 (two) times daily. 05/09/22   Fenton, Clint R, PA  ezetimibe (ZETIA) 10 MG tablet Take 10 mg by mouth at bedtime.  09/17/16   [provider]  magnesium oxide (MAG-OX) 400 MG tablet Take 400 mg by mouth daily. 01/24/22 01/24/23  [provider]  milk thistle 175 MG tablet Take 175 mg by mouth See admin instructions. Take 175 mg by mouth every 2-3 days    [provider]  nadolol (CORGARD) 40 MG tablet Take 1 tablet (40 mg total) by mouth daily. 05/08/22   Fenton, Clint R, PA  OZEMPIC, 0.25 OR 0.5 MG/DOSE, 2 MG/3ML SOPN Inject 2 mg into the skin once a week. 09/21/21   [provider]  Victor 2.5-2.5 MCG/ACT  AERS INHALE 2 PUFFS BY MOUTH INTO THE LUNGS DAILY Patient taking differently: Inhale 2 each into the lungs daily. INHALE 2 PUFFS BY MOUTH INTO THE LUNGS DAILY 01/05/22   Collene Gobble, MD  XIIDRA 5 % SOLN Place 1 drop into both eyes daily. 11/08/21   [provider]      Allergies    Doxycycline and Naproxen    Review of Systems   Review of Systems  Physical Exam Updated Vital Signs BP (!) 145/81   Pulse 71   Temp 98 F (36.7 C) (Oral)   Resp 19   SpO2 95%  Physical Exam Constitutional:      General: She is not in acute distress. HENT:     Head: Normocephalic and atraumatic.  Eyes:     Conjunctiva/sclera: Conjunctivae normal.     Pupils: Pupils are equal, round, and reactive to light.  Cardiovascular:     Rate and Rhythm: Normal rate and regular rhythm.  Pulmonary:     Effort: Pulmonary effort is normal. No respiratory distress.  Abdominal:     General: There is no distension.     Tenderness: There is no abdominal tenderness.  Skin:    General: Skin is warm and dry.  Neurological:     General: No focal deficit present.     Mental Status: She is alert and oriented to person, place,  and time. Mental status is at baseline.     Cranial Nerves: No cranial nerve deficit.     Sensory: No sensory deficit.     Motor: No weakness.     Coordination: Coordination normal.     Gait: Gait normal.  Psychiatric:        Mood and Affect: Mood normal.        Behavior: Behavior normal.     ED Results / Procedures / Treatments   Labs (all labs ordered are listed, but only abnormal results are displayed) Labs Reviewed  PROTIME-INR - Abnormal; Notable for the following components:      Result Value   Prothrombin Time 18.6 (*)    INR 1.6 (*)    All other components within normal limits  APTT - Abnormal; Notable for the following components:   aPTT 43 (*)    All other components within normal limits  CBC - Abnormal; Notable for the following components:   WBC 2.4 (*)     Platelets 53 (*)    All other components within normal limits  DIFFERENTIAL - Abnormal; Notable for the following components:   Neutro Abs 1.0 (*)    All other components within normal limits  COMPREHENSIVE METABOLIC PANEL - Abnormal; Notable for the following components:   Glucose, Bld 115 (*)    Calcium 8.5 (*)    Total Protein 5.9 (*)    Albumin 2.7 (*)    All other components within normal limits  CBG MONITORING, ED - Abnormal; Notable for the following components:   Glucose-Capillary 133 (*)    All other components within normal limits  I-STAT CHEM 8, ED - Abnormal; Notable for the following components:   Glucose, Bld 112 (*)    Calcium, Ion 1.09 (*)    All other components within normal limits  ETHANOL    EKG EKG Interpretation  Date/Time:  Wednesday May 23 2022 19:44:11 EST Ventricular Rate:  71 PR Interval:  173 QRS Duration: 100 QT Interval:  441 QTC Calculation: 480 R Axis:   71 Text Interpretation: Sinus rhythm Anteroseptal infarct, old Confirmed by Octaviano Glow [phone]) on 05/23/2022 7:45:22 PM  Radiology MR BRAIN WO CONTRAST  Result Date: 05/23/2022 CLINICAL DATA:  Transient aphasia, imbalance EXAM: MRI HEAD WITHOUT CONTRAST TECHNIQUE: Multiplanar, multiecho pulse sequences of the brain and surrounding structures were obtained without intravenous contrast. COMPARISON:  05/23/2022 FINDINGS: Brain: No restricted diffusion to suggest acute or subacute infarct.No acute hemorrhage, mass, mass effect, or midline shift. No hydrocephalus or extra-axial collection.No hemosiderin deposition to suggest remote hemorrhage.Partial empty sella. Normal craniocervical junction.Scattered T2 hyperintense signal in the periventricular white matter, likely the sequela of mild chronic small vessel ischemic disease. Vascular: Patent arterial flow voids. Skull and upper cervical spine: Normal marrow signal. Sinuses/Orbits: Mucosal thickening in the ethmoid air cells.No acute finding in  the orbits. Other: The mastoid air cells are well aerated. IMPRESSION: No acute intracranial process. No evidence of acute or subacute infarct. Electronically Signed   By: Merilyn Baba M.D.   On: 05/23/2022 22:11   CT HEAD CODE STROKE WO CONTRAST  Result Date: 05/23/2022 CLINICAL DATA:  Code stroke.  Slurred speech, aphasia, irritable EXAM: CT HEAD WITHOUT CONTRAST TECHNIQUE: Contiguous axial images were obtained from the base of the skull through the vertex without intravenous contrast. RADIATION DOSE REDUCTION: This exam was performed according to the departmental dose-optimization program which includes automated exposure control, adjustment of the mA and/or kV according to patient size and/or use of  iterative reconstruction technique. COMPARISON:  04/14/2022 FINDINGS: Brain: No evidence of acute infarction, hemorrhage, mass, mass effect, or midline shift. No hydrocephalus or extra-axial collection. Vascular: No hyperdense vessel. Skull: Negative for fracture or focal lesion. Sinuses/Orbits: Mucosal thickening in the ethmoid air cells. 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 No acute finding in the orbits. Other: The mastoid air cells are well aerated. ASPECTS [facility] Stroke Program Early CT Score) - Ganglionic level infarction (caudate, lentiform nuclei, internal capsule, insula, M1-M3 cortex): 7 - Supraganglionic infarction (M4-M6 cortex): 3 Total score (0-10 with 10 being normal): 10 IMPRESSION: 1. No acute intracranial process. 2. ASPECTS is 10. Imaging results were communicated on 05/23/2022 at 7:21 pm to provider STACK via secure text paging. Electronically Signed   By: Wiliam Ke M.D.   On: 05/23/2022 19:21    Procedures Procedures    Medications Ordered in ED Medications - No data to display   ED Course/ Medical Decision Making/ A&P Clinical Course as of 05/25/22 1652  Wed May 23, 2022  2001 Plan for MRI, patient remains stable and asymptomatic in the room [MT]  2230 Pt remains  asymptomatic in Ed.  MRI negative.  Okay for discharge.  Suspect recrudescence of prior symptoms most likely, now resolved.  She admits to stressful phone calls proceeding her episodes.  Her granddaughter is at bedside to take her home. [MT]    Clinical Course User Index [MT] Karalyn Kadel, Kermit Balo, MD                             Medical Decision Making Amount and/or Complexity of Data Reviewed Radiology: ordered.   This patient presents to the ED with concern for dizziness, aphasia, transient. This involves an extensive number of treatment options, and is a complaint that carries with it a high risk of complications and morbidity.  The differential diagnosis includes TIA vs recrudescence vs arrhythmia vs metabolic derangement vs other  PA provider activates code stroke concerned for possible facial paresthesias. On my exam NIHSS of 0 (neurology notes 1 for mild left upper extremity paresthesia).  Neurology at bedside to evaluate the patient per code stroke protocol and agreed with CT imaging, but no evidence of LVO and no indication for tnk at this time  Co-morbidities that complicate the patient evaluation: hx of A Fib, TIA, CVA risk factors  Additional history obtained from patient's granddaughter at bedside  External records from outside source obtained and reviewed including MRI brain 04/13/22 noting acute infarcts of right cerebellum, midbrain, left occipital lobe, and right parietal lobe. Echo 04/14/22 with EF 60-65%.  I ordered and personally interpreted labs.  The pertinent results include:  WBC 2.4 (chronic), hgb 13.6, glucose 133. Chronic thrombocytopenia as well, platelets 53 today  I ordered imaging studies including CT head, MRI brain I independently visualized and interpreted imaging which showed no acute infarcts I agree with the radiologist interpretation  The patient was maintained on a cardiac monitor.  I personally viewed and interpreted the cardiac monitored which showed an  underlying rhythm of: sinus rhythm  Per my interpretation the patient's ECG shows sinus rhythm without acute ischemic finding  I have reviewed the patients home medicines and have made adjustments as needed  Test Considered: low suspicion for acute PE, meningitis - did not see indication for emergent LP or CTA chest at this time  I requested consultation with the neurology,  and discussed lab and imaging findings as well as pertinent  plan - they recommend: MRI brain - continue on zetia and eliquis - patient is optimized for stroke treatment already  After the interventions noted above, I reevaluated the patient and found that they have: improved - asymptomatic since arrival in the ED   Dispostion:  After consideration of the diagnostic results and the patients response to treatment, I feel that the patent would benefit from outpatient follow up.             Final Clinical Impression(s) / ED Diagnoses Final diagnoses:  Slurred speech    Rx / DC Orders ED Discharge Orders     None         Sreshta Cressler, Kermit Balo, MD 05/25/22 [id number]

## 2022-05-23 NOTE — Code Documentation (Addendum)
Whitney [name] is a 67 yr old female with a H/O AF, NASH and prior cyrptogenic stroke. She is on Eliquis. She was LKW today at 1600, after which she began having aphasia, dysarthria and was "irritable".   Code stroke alert was activated in Triage. CBG, labs have been drawn. VSS.  Pt examined by Dr. Quinn Axe. NIHSS 1 for left arm drift. The aphasia and dysarthria have resolved.   Pt taken to CT to r/o hemorrhage. CT neg for hemorrhage per Dr Quinn Axe. Pt returned to room 28 where her workup will continue. Pt not candidate for TNK due to Eliquis. Not candidate for Thrombectomy as no cortical LVO symptoms. Bedside handoff with ED RN complete.

## 2022-05-23 NOTE — ED Triage Notes (Signed)
Pt came in with Stroke-like s/s from home where her husband noted at 1600 she began acting "odd," was confused, irritable & out of charcter, slurred speech & aphasic. She does have Hx of stroke last month. EMS arrived & her CBG was 70, she was given juice & gel, her BP was 180/110. While in triage she is back to baseline, A/oX4, CBG is now 133 & family at bedside.

## 2022-05-23 NOTE — ED Notes (Signed)
Patient taken to MRI

## 2022-05-23 NOTE — ED Provider Triage Note (Addendum)
Emergency Medicine Provider Triage Evaluation Note  Whitney [name] , a 67 y.o. female  was evaluated in triage.  Patient came in with stroke-like symptoms at home where husband noted at 1600 she began acting "odd", was confused, irritable, and had slurred speech and dysphasia.  Patient states she was on the phone prior to that and was aware of her abnormal speech and also felt her lips were numb. She also reports fatigue all day, a new onset headache, and lightheadedness.  CBG at home was initially 70, was given juice and gel with improvement in blood sugar and some symptoms.  BP was 180/110 at home.  She is anticoagulated with Eliquis.  Reports symptoms are very similar when she had acute CVA last month.   Review of Systems  Positive: As above Negative: As above  Physical Exam  BP (!) 163/86   Pulse 71   Temp 98 F (36.7 C) (Oral)   Resp 19   SpO2 99%  Gen:   Awake, no distress   Resp:  Normal effort  MSK:   Moves extremities without difficulty; 5/5 strength in bilateral upper and lower extremities  Other:  No facial palsy, slurred speech, aphasia or dysphasia; patient is able to recall all events; sensation intact bilaterally and is normal   Medical Decision Making  Medically screening exam initiated at 6:30 PM.  Appropriate orders placed.  AVENLY ROBERGE was informed that the remainder of the evaluation will be completed by another provider, this initial triage assessment does not replace that evaluation, and the importance of remaining in the ED until their evaluation is complete.     Theressa Stamps R, [place] 05/23/22 1835    Pat Kocher, [place] 05/23/22 [phone]

## 2022-06-07 ENCOUNTER — Ambulatory Visit (HOSPITAL_COMMUNITY)
Admission: RE | Admit: 2022-06-07 | Discharge: 2022-06-07 | Disposition: A | Discharge status: Home / Self Care | Payer: Medicare PPO | Source: Ambulatory Visit | Attending: Physician Assistant | Admitting: Physician Assistant

## 2022-06-07 ENCOUNTER — Encounter (HOSPITAL_COMMUNITY): Payer: Self-pay | Admitting: Physician Assistant

## 2022-06-07 VITALS — BP 128/84 | HR 77 | Ht 64.0 in | Wt 222.2 lb

## 2022-06-07 DIAGNOSIS — Z8673 Personal history of transient ischemic attack (TIA), and cerebral infarction without residual deficits: Secondary | ICD-10-CM | POA: Diagnosis not present

## 2022-06-07 DIAGNOSIS — E669 Obesity, unspecified: Secondary | ICD-10-CM | POA: Insufficient documentation

## 2022-06-07 DIAGNOSIS — M3500 Sicca syndrome, unspecified: Secondary | ICD-10-CM | POA: Diagnosis not present

## 2022-06-07 DIAGNOSIS — I480 Paroxysmal atrial fibrillation: Secondary | ICD-10-CM | POA: Diagnosis not present

## 2022-06-07 DIAGNOSIS — E119 Type 2 diabetes mellitus without complications: Secondary | ICD-10-CM | POA: Insufficient documentation

## 2022-06-07 DIAGNOSIS — D6869 Other thrombophilia: Secondary | ICD-10-CM

## 2022-06-07 DIAGNOSIS — Z8249 Family history of ischemic heart disease and other diseases of the circulatory system: Secondary | ICD-10-CM | POA: Diagnosis not present

## 2022-06-07 DIAGNOSIS — Z6838 Body mass index (BMI) 38.0-38.9, adult: Secondary | ICD-10-CM | POA: Diagnosis not present

## 2022-06-07 DIAGNOSIS — D696 Thrombocytopenia, unspecified: Secondary | ICD-10-CM | POA: Insufficient documentation

## 2022-06-07 DIAGNOSIS — K7581 Nonalcoholic steatohepatitis (NASH): Secondary | ICD-10-CM | POA: Insufficient documentation

## 2022-06-07 DIAGNOSIS — G4733 Obstructive sleep apnea (adult) (pediatric): Secondary | ICD-10-CM | POA: Diagnosis not present

## 2022-06-07 LAB — CBC
HCT: 42.7 % (ref 36.0–46.0)
Hemoglobin: 13.8 g/dL (ref 12.0–15.0)
MCH: 30.9 pg (ref 26.0–34.0)
MCHC: 32.3 g/dL (ref 30.0–36.0)
MCV: 95.7 fL (ref 80.0–100.0)
Platelets: 58 10*3/uL — ABNORMAL LOW (ref 150–400)
RBC: 4.46 MIL/uL (ref 3.87–5.11)
RDW: 14.2 % (ref 11.5–15.5)
WBC: 2.5 10*3/uL — ABNORMAL LOW (ref 4.0–10.5)
nRBC: 0 % (ref 0.0–0.2)

## 2022-06-07 NOTE — Progress Notes (Signed)
Primary Care Physician: Lawerance Cruel, MD Primary Cardiologist: Dr Irish Lack  Primary Electrophysiologist: Dr Caryl Comes  Referring Physician: Dr Hassan Buckler Whitney [name] is a 67 y.o. female with a history of NASH cirrhosis, chronic thrombocytopenia treated at [facility], AS s/p tissue AVR 2019, DM2, HLD, Sjogren's syndrome, CVA, atrial fibrillation who presents for follow up in the [facility]. The patient was admitted on 04/13/2022 with imbalance, dipolopia, nausea, and numbness. MRI showed right cerebellum, midbrain, left occipital, right parietal punctate infarcts. No tPA given outside of window. An ILR was placed for cryptogenic stroke. Patient has a CHADS2VASC score of 6. The device clinic received an alert for increased afib burden on ILR. The longest episode lasted for ~ 4 hours. She was unaware of her afib at the time, she was perhaps more fatigued.   On follow up today, patient was seen at the ED for stroke symptoms. CT and MRI of brain were negative for acute process, felt to be related to hypoglycemia. She has felt well since that time. ILR shows 0.1% afib burden. No bleeding issues on anticoagulation.   Today, she denies symptoms of palpitations, chest pain, shortness of breath, orthopnea, PND, lower extremity edema, dizziness, presyncope, syncope, snoring, daytime somnolence, bleeding, or neurologic sequela. The patient is tolerating medications without difficulties and is otherwise without complaint today.    Atrial Fibrillation Risk Factors:  she does have symptoms or diagnosis of sleep apnea. She is compliant with CPAP. she does have a history of rheumatic fever. she does not have a history of alcohol use.   she has a BMI of Body mass index is 38.14 kg/m.Marland Kitchen Filed Weights   06/07/22 0934  Weight: 100.8 kg    Family History  Problem Relation Age of Onset   Alzheimer's disease Father    Hip fracture Father    Asthma Father    Heart disease Father     Heart attack Father    Hypertension Father    Rheum arthritis Mother    Heart disease Mother    Allergies Daughter    Asthma Paternal Grandmother    Asthma Daughter    Stroke Neg Hx      Atrial Fibrillation Management history:  Previous antiarrhythmic drugs: none Previous cardioversions: none Previous ablations: none CHADS2VASC score: 6 Anticoagulation history: Eliquis   Past Medical History:  Diagnosis Date   Allergic rhinitis    Anemia    hx   Arthritis    Asthma    hx yrs ago   Cirrhosis ([place]) last albumin 3.3 done at [facility] 06-16-2014 (under care everywhere tab in epic)   Secondary to Fatty liver --  followed by hepatology at [facility] (dr Gerald Dexter)    Depression    Diabetes mellitus type II    type 2 diet conrolled   Dyspnea    Fibromyalgia    GERD (gastroesophageal reflux disease)    Heart murmur    asymptomatic ---  1989 from rhuematic fever   History of exercise stress test    05-05-2013---  negative bruce ETT given exercise workload,  no ischemia   History of hiatal hernia    History of kidney stones    History of rheumatic fever    1989   Hyperlipidemia    Leukocytopenia    Moderate aortic stenosis    AVA area 1.1cm2---  cardiologist --  dr Concepcion Living, 2014 in epic   NASH (nonalcoholic steatohepatitis)    OSA (obstructive sleep apnea)  was using CPAP before gastric sleeve 2015--  no uses after wt loss   Pneumonia    hx   Sjogren's syndrome (HCC)    Thrombocytopenia (HCC)    Past Surgical History:  Procedure Laterality Date   AORTIC VALVE REPLACEMENT N/A 12/05/2017   Procedure: AORTIC VALVE REPLACEMENT (AVR) TISSUE VALVE INSPIRIS;  Surgeon: Alleen Borne, MD;  Location: [facility] OR;  Service: Open Heart Surgery;  Laterality: N/A;   COLONOSCOPY WITH ESOPHAGOGASTRODUODENOSCOPY (EGD)     CYSTOSCOPY WITH RETROGRADE PYELOGRAM, URETEROSCOPY AND STENT PLACEMENT Left 01/21/2015   Procedure: CYSTOSCOPY WITH LEFT  RETROGRADE PYELOGRAM, LEFT URETEROSCOPY AND  STENT PLACEMENT;  Surgeon: Jerilee Field, MD;  Location: [facility];  Service: Urology;  Laterality: Left;   CYSTOSCOPY WITH RETROGRADE PYELOGRAM, URETEROSCOPY AND STENT PLACEMENT Bilateral 02/24/2015   Procedure: CYSTOSCOPY WITH RIGHT RETROGRADE PYELOGRAM, BLADDER BIOPSY FULGERATION LEFT URETEROSCOPY AND STENT REPLACEMENT;  Surgeon: Jerilee Field, MD;  Location: [facility];  Service: Urology;  Laterality: Bilateral;   EXPLORATORY LAPAROSCOPY W/  CONE BIOPSY'S LEFT AND RIGHT LOBE OF LIVER  11-04-2007   HOLMIUM LASER APPLICATION Left 02/24/2015   Procedure: HOLMIUM LASER LITHOTRIPSY;  Surgeon: Jerilee Field, MD;  Location: [facility];  Service: Urology;  Laterality: Left;   HYSTEROSCOPY WITH D & C N/A 12/11/2012   Procedure: DILATATION AND CURETTAGE /HYSTEROSCOPY;  Surgeon: Dorien Chihuahua. Richardson Dopp, MD;  Location: [facility];  Service: Gynecology;  Laterality: N/A;   INGUINAL HERNIA REPAIR Left 10/22/2016   Procedure: LEFT INGUINAL HERNIA REPAIR;  Surgeon: Emelia Loron, MD;  Location: [facility] OR;  Service: General;  Laterality: Left;  TAP BLOCK   INSERTION OF MESH Left 10/22/2016   Procedure: INSERTION OF MESH;  Surgeon: Emelia Loron, MD;  Location: [facility] OR;  Service: General;  Laterality: Left;   LAPAROSCOPIC GASTRIC SLEEVE RESECTION  07-27-2013   LOOP RECORDER INSERTION N/A 04/16/2022   Procedure: LOOP RECORDER INSERTION;  Surgeon: Duke Salvia, MD;  Location: [facility] INVASIVE CV LAB;  Service: Cardiovascular;  Laterality: N/A;   PUBOVAGINAL SLING  04-10-2001   SPARC   RIGHT/LEFT HEART CATH AND CORONARY ANGIOGRAPHY N/A 08/23/2017   Procedure: RIGHT/LEFT HEART CATH AND CORONARY ANGIOGRAPHY;  Surgeon: Corky Crafts, MD;  [facility] CV LAB;  Service: Cardiovascular;  Laterality: N/A;   TEE WITHOUT CARDIOVERSION N/A 12/05/2017   Procedure: TRANSESOPHAGEAL ECHOCARDIOGRAM (TEE);  Surgeon: Alleen Borne, MD;  Location: [facility] OR;  Service: Open Heart  Surgery;  Laterality: N/A;   TONSILLECTOMY  1975   TRANSTHORACIC ECHOCARDIOGRAM  06-04-2012  dr Eldridge Dace   mild LVH,  grade I diastolic dysfunction/  ef 60-65%/  moderate LAE/  mild MV calcifation without stenosis,  mild MR/  moderate AV stenosis,  cannot r/o bicupsid, area 1.1cm2/  mild dilated aortic root/  trivial TR    Current Outpatient Medications  Medication Sig Dispense Refill   albuterol (VENTOLIN HFA) 108 (90 Base) MCG/ACT inhaler INHALE 2 PUFFS INTO THE LUNGS EVERY 4 HOURS AS NEEDED FOR WHEEZING OR SHORTNESS OF BREATH. 8.5 each 5   amoxicillin (AMOXIL) 500 MG capsule Take 4 capsules 1 hour prior to any dental procedures 4 capsule 3   apixaban (ELIQUIS) 5 MG TABS tablet Take 1 tablet (5 mg total) by mouth 2 (two) times daily. 60 tablet 3   ezetimibe (ZETIA) 10 MG tablet Take 10 mg by mouth at bedtime.   11   magnesium oxide (MAG-OX) 400 MG tablet Take 400 mg by mouth daily.  milk thistle 175 MG tablet Take 175 mg by mouth See admin instructions. Take 175 mg by mouth every 2-3 days     nadolol (CORGARD) 40 MG tablet Take 1 tablet (40 mg total) by mouth daily. 30 tablet 6   OZEMPIC, 0.25 OR 0.5 MG/DOSE, 2 MG/3ML SOPN Inject 2 mg into the skin once a week.     STIOLTO RESPIMAT 2.5-2.5 MCG/ACT AERS INHALE 2 PUFFS BY MOUTH INTO THE LUNGS DAILY (Patient taking differently: Inhale 2 each into the lungs daily. INHALE 2 PUFFS BY MOUTH INTO THE LUNGS DAILY) 4 g 5   XIIDRA 5 % SOLN Place 1 drop into both eyes daily.     No current facility-administered medications for this encounter.    Allergies  Allergen Reactions   Doxycycline Hives and Rash   Naproxen Rash and Hives    Social History   Socioeconomic History   Marital status: Widowed    Spouse name: Married to [employer]   Number of children: Not on file   Years of education: Not on file   Highest education level: Not on file  Occupational History   Occupation: principal of [employer]  Tobacco Use   Smoking status: Never    Smokeless tobacco: Never   Tobacco comments:    Never smoke 05/08/22  Vaping Use   Vaping Use: Never used  Substance and Sexual Activity   Alcohol use: Yes    Comment: rarely   Drug use: No   Sexual activity: Not on file  Other Topics Concern   Not on file  Social History Narrative   Not on file   Social Determinants of Health   Financial Resource Strain: Not on file  Food Insecurity: No Food Insecurity (04/14/2022)   Hunger Vital Sign    Worried About Running Out of Food in the Last Year: Never true    Ran Out of Food in the Last Year: Never true  Transportation Needs: No Transportation Needs (04/14/2022)   PRAPARE - Hydrologist (Medical): No    Lack of Transportation (Non-Medical): No  Physical Activity: Not on file  Stress: Not on file  Social Connections: Not on file  Intimate Partner Violence: Not At Risk (04/14/2022)   Humiliation, Afraid, Rape, and Kick questionnaire    Fear of Current or Ex-Partner: No    Emotionally Abused: No    Physically Abused: No    Sexually Abused: No     ROS- All systems are reviewed and negative except as per the HPI above.  Physical Exam: Vitals:   06/07/22 0934  BP: 128/84  Pulse: 77  Weight: 100.8 kg  Height: 5\' 4"  (1.626 m)    GEN- The patient is a well appearing obese female, alert and oriented x 3 today.   HEENT-head normocephalic, atraumatic, sclera clear, conjunctiva pink, hearing intact, trachea midline. Lungs- Clear to ausculation bilaterally, normal work of breathing Heart- Regular rate and rhythm, no murmurs, rubs or gallops  GI- soft, NT, ND, + BS Extremities- no clubbing, cyanosis, or edema MS- no significant deformity or atrophy Skin- no rash or lesion Psych- euthymic mood, full affect Neuro- strength and sensation are intact   Wt Readings from Last 3 Encounters:  06/07/22 100.8 kg  05/17/22 98.9 kg  05/08/22 99.2 kg    EKG today demonstrates  SR Vent. rate 77 BPM PR  interval 168 ms QRS duration 102 ms QT/QTcB 416/470 ms  Echo 04/14/22 demonstrated   1. Left ventricular  ejection fraction, by estimation, is 60 to 65%. The  left ventricle has normal function. The left ventricle has no regional  wall motion abnormalities. There is mild concentric left ventricular  hypertrophy. Left ventricular diastolic parameters are consistent with Grade II diastolic dysfunction (pseudonormalization). Elevated left ventricular end-diastolic pressure.   2. Right ventricular systolic function is normal. The right ventricular  size is normal. There is moderately elevated pulmonary artery systolic  pressure.   3. Left atrial size was severely dilated.   4. Right atrial size was severely dilated.   5. Aortic dilatation noted. There is mild dilatation of the ascending  aorta, measuring 41 mm.   6. The mitral valve is degenerative. Mild mitral valve regurgitation.  Mild mitral stenosis. The mean mitral valve gradient is 4.0 mmHg. There is  heavy calcification of the mitral annulus extending into the LV cavity and  involving the submitral  apparatus. There is caseous mitral annular calcification extending into  the basal lateral LV wall.   7. The aortic valve has been repaired/replaced. There is a 21 mm Inspiris  bovine pericardial valve present in the aortic position.      Perivalvular Aortic valve regurgitation is trivial. There is moderate  calcification and thickening of the AV leaflets. Mild aortic stenosis is  present. Aortic valve mean gradient measures 21.0 mmHg. Aortic valve peak gradient measures 37.9 mmHg.  Aortic valve area, by VTI measures 1.91 cm.   8. The inferior vena cava is normal in size with greater than 50%  respiratory variability, suggesting right atrial pressure of 3 mmHg.   9. Compared to study dated 12/26/2018, the is no significant change in  mean AVG or DVI.   Epic records are reviewed at length today  CHA2DS2-VASc Score = 6  The patient's  score is based upon: CHF History: 0 HTN History: 0 Diabetes History: 1 Stroke History: 2 Vascular Disease History: 1 (aortic atherosclerosis) Age Score: 1 Gender Score: 1       ASSESSMENT AND PLAN: 1. Paroxysmal Atrial Fibrillation (ICD10:  I48.0) The patient's CHA2DS2-VASc score is 6, indicating a 9.7% annual risk of stroke.   General education about afib provided and questions answered. We also discussed her stroke risk and the risks and benefits of anticoagulation. ILR shows 0.1% afib burden Continue Eliquis 5 mg BID. Recheck cbc today. Continue nadolol 40 mg daily  2. Secondary Hypercoagulable State (ICD10:  D68.69) The patient is at significant risk for stroke/thromboembolism based upon her CHA2DS2-VASc Score of 6.  Continue Apixaban (Eliquis).   3. Obesity Body mass index is 38.14 kg/m. S/p gastric sleeve. Lifestyle modification was discussed and encouraged including regular physical activity and weight reduction.  4. Obstructive sleep apnea Encouraged compliance with CPAP.  5. Chronic thrombocytopenia  Platelets 58K Will need to watch closely on anticoagulation.    Follow up with Dr Caryl Comes and Dr Irish Lack as scheduled.    [facility] [address] [place], [place] [address] [phone] 06/07/2022 9:55 AM

## 2022-06-19 NOTE — Progress Notes (Unsigned)
Cardiology Office Note   Date:  06/20/2022   ID:  Whitney, [name] 1956/03/25, MRN [id number]  PCP:  Lawerance Cruel, MD    No chief complaint on file.  S/p AVR  Wt Readings from Last 3 Encounters:  06/20/22 216 lb 3.2 oz (98.1 kg)  06/07/22 222 lb 3.2 oz (100.8 kg)  05/17/22 218 lb (98.9 kg)       History of Present Illness: Whitney [name] is a 67 y.o. female  with a past medical history significant for NASH and chronic thrombocytopenia treated at [facility], gastric sleeve surgery 2015 with 100 lb wt loss, aortic stenosis s/p tissue AVR 12/05/2017, DM type 2, hyperlipidemia.   Husband passed away at 65 related to opioid consumption.   [name] had known aortic stenosis that had progressed with symptoms of exertional fatigue and tiredness.  Echocardiogram in April 2019 showed a mean gradient of 41 mmHg and a peak gradient of 80 mmHg.  LV systolic function was normal.  She was evaluated by her liver specialist who felt there was no contraindication to proceeding with an aortic valve replacement.  She underwent tissue AVR on 12/05/2017.  She developed brief runs of atrial fibrillation postoperatively.  She chemically converted back to sinus rhythm with amiodarone and Lopressor was increased to 25 mg twice daily.  She had postoperative volume overload which is improved.  She has chronic thrombocytopenia and platelets were stable at 54,000 in the hospital.   Post-op, she had intermittent shortness of breath.  She has had paralysis of the right hemidiaphragm.   She thinks this happened at the time of the chest tube removal.     She still has intermittent shortness of breath.  There was a concern of portopulmonary HTN, Sjogrens.  She has gained weight as well.  She got her COVID vaccines. CPX test was considered: "Multiple possible etiologies including pulmonary hypertension related to her liver disease, autoimmune disease.  I reviewed the records from [place].  CPX testing was considered as well  as right heart catheterization.  Will get input from Dr. Lamonte Sakai as well to see how best to schedule the CPX test.  We can arrange potentially in a heart failure clinic as well.  I encouraged her to try to increase her exercise gradually.  She states she does not like to exercise.  She does not go out for regular walks.  I think just some routine activity would be helpful to try to increase her stamina."   The fatigue has been progressive.  She is concerned about her liver disease and her heart. She has leg cramps.    She does try to be active with pulmonary rehab.  Her diaphragm issue is concerning to her.    Echo at [facility] 2/21: Morphology: Normal      Mobility: Fully Mobile      AOV Note: 19m INSPIRIS   LEFT VENTRICLE                                      Anterior: Normal          Size: Normal                                 Lateral: Normal   Contraction: Normal  Septal: Normal    Closest EF: >55%(Estimated)  Calc.EF: 59% (3D)      Apical: Normal     LV masses: No Masses                             Inferior: Normal    In 2023: "She has had some left sided chest / shoulder pain- feels like a bruise- more intense on the scapula area.  She massages the area and it gets better.  Episodes come on rarely.  No problems with walking.     Using Ozempic now- prescribed from [place] [facility]- NASH. Weight increased.     Unable to walk since paralyzed hemidiaphragm. "   Son has had issues with prior gastric sleeve.  He has been treated at [facility]   Stroke in December 2023 showed: "Acute stroke Patient's symptoms included double vision disequilibrium and paresthesias of hands. Multiple areas of infarct noted on MRI.  Concern for cardioembolic etiology. Neurology has been consulted. Patient underwent MRI brain and CT angiogram head and neck. Echocardiogram shows normal systolic function.  Calcific mitral and aortic valve noted. LDL is 97.  HbA1c is 5.4. Patient noted to  be on Zetia.  Due to her history of NASH she does not appear to be a candidate for statin. Neurology recommends aspirin and Plavix for 3 weeks followed by aspirin alone. Lower extremity Doppler negative for DVT.  Loop recorder to be placed prior to discharge. PT OT SLP following.  Home health recommended.     Fever No clear source of infection identified.  Fever appears to have subsided  Noted to have leukopenia which is chronic for her.  No obvious source of infection identified.  UA was noted to be unremarkable.  Patient does not have any diarrhea.   COVID-19 RSV and influenza PCR's were negative.  Respiratory viral panel was negative.  Strep screen was negative.  Procalcitonin noted to be less than 0.1 and remains low this morning. Blood cultures without any growth after 48 hours.  Echocardiogram does not show endocarditis.  Fever has resolved.  Procalcitonin remains less than 0.1.  Could have had a transient viral syndrome."  Ultimately started on ELiquis for AFib. Has loop recorder.  Jan 2024, had slurred speech.    Past Medical History:  Diagnosis Date   Allergic rhinitis    Anemia    hx   Arthritis    Asthma    hx yrs ago   Cirrhosis ([place]) last albumin 3.3 done at [place] 06-16-2014 (under care everywhere tab in epic)   Secondary to Fatty liver --  followed by hepatology at [facility] (dr Gerald Dexter)    Depression    Diabetes mellitus type II    type 2 diet conrolled   Dyspnea    Fibromyalgia    GERD (gastroesophageal reflux disease)    Heart murmur    asymptomatic ---  1989 from rhuematic fever   History of exercise stress test    05-05-2013---  negative bruce ETT given exercise workload,  no ischemia   History of hiatal hernia    History of kidney stones    History of rheumatic fever    1989   Hyperlipidemia    Leukocytopenia    Moderate aortic stenosis    AVA area 1.1cm2---  cardiologist --  dr Concepcion Living, 2014 in epic   NASH (nonalcoholic steatohepatitis)    OSA  (obstructive sleep apnea)    was  using CPAP before gastric sleeve 2015--  no uses after wt loss   Pneumonia    hx   Sjogren's syndrome (HCC)    Thrombocytopenia (HCC)     Past Surgical History:  Procedure Laterality Date   AORTIC VALVE REPLACEMENT N/A 12/05/2017   Procedure: AORTIC VALVE REPLACEMENT (AVR) TISSUE VALVE 21MM INSPIRIS;  Surgeon: Gaye Pollack, MD;  Location: [facility];  Service: Open Heart Surgery;  Laterality: N/A;   COLONOSCOPY WITH ESOPHAGOGASTRODUODENOSCOPY (EGD)     CYSTOSCOPY WITH RETROGRADE PYELOGRAM, URETEROSCOPY AND STENT PLACEMENT Left 01/21/2015   Procedure: CYSTOSCOPY WITH LEFT  RETROGRADE PYELOGRAM, LEFT URETEROSCOPY AND STENT PLACEMENT;  Surgeon: Festus Aloe, MD;  Location: [facility];  Service: Urology;  Laterality: Left;   CYSTOSCOPY WITH RETROGRADE PYELOGRAM, URETEROSCOPY AND STENT PLACEMENT Bilateral 02/24/2015   Procedure: CYSTOSCOPY WITH RIGHT RETROGRADE PYELOGRAM, BLADDER BIOPSY FULGERATION LEFT URETEROSCOPY AND STENT REPLACEMENT;  Surgeon: Festus Aloe, MD;  Location: [facility];  Service: Urology;  Laterality: Bilateral;   EXPLORATORY LAPAROSCOPY W/  CONE BIOPSY'S LEFT AND RIGHT LOBE OF LIVER  11-04-2007   HOLMIUM LASER APPLICATION Left [id number]   Procedure: HOLMIUM LASER LITHOTRIPSY;  Surgeon: Festus Aloe, MD;  Location[facility];  Service: Urology;  Laterality: Left;   HYSTEROSCOPY WITH D & C N/A 12/11/2012   Procedure: DILATATION AND CURETTAGE /HYSTEROSCOPY;  Surgeon: Maeola Sarah. Landry Mellow, MD;  Location: [facility];  Service: Gynecology;  Laterality: N/A;   INGUINAL HERNIA REPAIR Left 10/22/2016   Procedure: LEFT INGUINAL HERNIA REPAIR;  Surgeon: Rolm Bookbinder, MD;  Location: [place];  Service: General;  Laterality: Left;  TAP BLOCK   INSERTION OF MESH Left 10/22/2016   Procedure: INSERTION OF MESH;  Surgeon: Rolm Bookbinder, MD;  Location: [place];  Service: General;  Laterality: Left;   LAPAROSCOPIC  GASTRIC SLEEVE RESECTION  07-27-2013   LOOP RECORDER INSERTION N/A 04/16/2022   Procedure: LOOP RECORDER INSERTION;  Surgeon: Deboraha Sprang, MD;  Location: [facility];  Service: Cardiovascular;  Laterality: N/A;   PUBOVAGINAL SLING  04-10-2001   [place]   RIGHT/LEFT HEART CATH AND CORONARY ANGIOGRAPHY N/A 08/23/2017   Procedure: RIGHT/LEFT HEART CATH AND CORONARY ANGIOGRAPHY;  Surgeon: [name], MD;  Location: [facility];  Service: Cardiovascular;  Laterality: N/A;   TEE WITHOUT CARDIOVERSION N/A 12/05/2017   Procedure: TRANSESOPHAGEAL ECHOCARDIOGRAM (TEE);  Surgeon: Gaye Pollack, MD;  Location: [place];  Service: Open Heart Surgery;  Laterality: N/A;   TONSILLECTOMY  1975   TRANSTHORACIC ECHOCARDIOGRAM  06-04-2012  dr Irish Lack   mild LVH,  grade I diastolic dysfunction/  ef 60-65%/  moderate LAE/  mild MV calcifation without stenosis,  mild MR/  moderate AV stenosis,  cannot r/o bicupsid, area 1.1cm2/  mild dilated aortic root/  trivial TR     Current Outpatient Medications  Medication Sig Dispense Refill   albuterol (VENTOLIN HFA) 108 (90 Base) MCG/ACT inhaler INHALE 2 PUFFS INTO THE LUNGS EVERY 4 HOURS AS NEEDED FOR WHEEZING OR SHORTNESS OF BREATH. 8.5 each 5   amoxicillin (AMOXIL) 500 MG capsule Take 4 capsules 1 hour prior to any dental procedures 4 capsule 3   apixaban (ELIQUIS) 5 MG TABS tablet Take 1 tablet (5 mg total) by mouth 2 (two) times daily. 60 tablet 3   ezetimibe (ZETIA) 10 MG tablet Take 10 mg by mouth at bedtime.   11   magnesium oxide (MAG-OX) 400 MG tablet Take 400 mg by mouth daily.  nadolol (CORGARD) 40 MG tablet Take 1 tablet (40 mg total) by mouth daily. 30 tablet 6   OZEMPIC, 0.25 OR 0.5 MG/DOSE, 2 MG/3ML SOPN Inject 2 mg into the skin once a week.     STIOLTO RESPIMAT 2.5-2.5 MCG/ACT AERS INHALE 2 PUFFS BY MOUTH INTO THE LUNGS DAILY (Patient taking differently: Inhale 2 each into the lungs daily. INHALE 2 PUFFS BY MOUTH INTO THE LUNGS  DAILY) 4 g 5   XIIDRA 5 % SOLN Place 1 drop into both eyes daily.     milk thistle 175 MG tablet Take 175 mg by mouth See admin instructions. Take 175 mg by mouth every 2-3 days     No current facility-administered medications for this visit.    Allergies:   Doxycycline and Naproxen    Social History:  The patient  reports that she has never smoked. She has never used smokeless tobacco. She reports current alcohol use. She reports that she does not use drugs.   Family History:  The patient's family history includes Allergies in her daughter; Alzheimer's disease in her father; Asthma in her daughter, father, and paternal grandmother; Heart attack in her father; Heart disease in her father and mother; Hip fracture in her father; Hypertension in her father; Rheum arthritis in her mother.    ROS:  Please see the history of present illness.   Otherwise, review of systems are positive for feels that balance is off.   All other systems are reviewed and negative.    PHYSICAL EXAM: VS:  BP 138/80   Pulse 74   Ht 5' 4"$  (1.626 m)   Wt 216 lb 3.2 oz (98.1 kg)   SpO2 98%   BMI 37.11 kg/m  , BMI Body mass index is 37.11 kg/m. GEN: Well nourished, well developed, in no acute distress HEENT: normal Neck: no JVD, carotid bruits, or masses Cardiac: RRR; 2/6 systolic murmur, no rubs, or gallops,no edema  Respiratory:  clear to auscultation bilaterally, normal work of breathing GI: soft, nontender, nondistended, + BS, obesity MS: no deformity or atrophy Skin: warm and dry, no rash Neuro:  Strength and sensation are intact Psych: euthymic mood, full affect   EKG:   The ekg ordered 06/07/22 demonstrates NSR, no ST changes   Recent Labs: 04/13/2022: Magnesium 1.8 04/14/2022: B Natriuretic Peptide 293.6 05/23/2022: ALT 13; BUN 14; Creatinine, Ser 0.70; Potassium 4.1; Sodium 138 06/07/2022: Hemoglobin 13.8; Platelets 58   Lipid Panel    Component Value Date/Time   CHOL 140 04/14/2022 0242   TRIG  70 04/14/2022 0242   HDL 29 (L) 04/14/2022 0242   CHOLHDL 4.8 04/14/2022 0242   VLDL 14 04/14/2022 0242   LDLCALC 97 04/14/2022 0242     Other studies Reviewed: Additional studies/ records that were reviewed today with results demonstrating: labs reviewed.   ASSESSMENT AND PLAN:  S/p AVR: DOIng well.  Needs SBE prophylaxis.  No CHF.  Vlave appears to be functioning well.  PAF: ELiquis for stroke prevention, had embolic CVA in Dec 99991111.   Hyperlipidemia: On Zetia.  Chronic thrombocytopenia: No bleeding issues. 58K in 2/24 NASH: followed at [place] obesity: taking ozempic. Feels that appetite has decreased. Blood pressure elevation: Trying lifestyle changes.  INcrease activity.  Low salt diet.  She wants to avoid meds.  If BP stays over 140/90, would add amlodipine as she has some sx that could be consistent with Raynauds.  Keep Hands and feet warm.  Avoid holding cold beverages.    Current  medicines are reviewed at length with the patient today.  The patient concerns regarding her medicines were addressed.  The following changes have been made:  No change  Labs/ tests ordered today include:  No orders of the defined types were placed in this encounter.   Recommend 150 minutes/week of aerobic exercise Low fat, low carb, high fiber diet recommended  Disposition:   FU in 1 year   Signed, Larae Grooms, MD  06/20/2022 2:43 PM    [facility] [place], [place], [place]  [address] Phone: [phone]; Fax: [phone]

## 2022-06-20 ENCOUNTER — Other Ambulatory Visit: Payer: Self-pay | Admitting: Emergency Medicine

## 2022-06-20 ENCOUNTER — Encounter: Payer: Self-pay | Admitting: Interventional Cardiology

## 2022-06-20 ENCOUNTER — Ambulatory Visit: Payer: Medicare PPO | Attending: Interventional Cardiology | Admitting: Interventional Cardiology

## 2022-06-20 VITALS — BP 138/80 | HR 74 | Ht 64.0 in | Wt 216.2 lb

## 2022-06-20 DIAGNOSIS — E782 Mixed hyperlipidemia: Secondary | ICD-10-CM | POA: Diagnosis not present

## 2022-06-20 DIAGNOSIS — D6869 Other thrombophilia: Secondary | ICD-10-CM

## 2022-06-20 DIAGNOSIS — Z953 Presence of xenogenic heart valve: Secondary | ICD-10-CM

## 2022-06-20 DIAGNOSIS — E119 Type 2 diabetes mellitus without complications: Secondary | ICD-10-CM | POA: Diagnosis not present

## 2022-06-20 DIAGNOSIS — I480 Paroxysmal atrial fibrillation: Secondary | ICD-10-CM | POA: Diagnosis not present

## 2022-06-20 NOTE — Patient Instructions (Signed)
Medication Instructions:  Your physician recommends that you continue on your current medications as directed. Please refer to the Current Medication list given to you today.  *If you need a refill on your cardiac medications before your next appointment, please call your pharmacy*   Lab Work: none If you have labs (blood work) drawn today and your tests are completely normal, you will receive your results only by: [place] (if you have MyChart) OR A paper copy in the mail If you have any lab test that is abnormal or we need to change your treatment, we will call you to review the results.   Testing/Procedures: none   Follow-Up: At [facility], you and your health needs are our priority.  As part of our continuing mission to provide you with exceptional heart care, we have created designated Provider Care Teams.  These Care Teams include your primary Cardiologist (physician) and Advanced Practice Providers (APPs -  Physician Assistants and Nurse Practitioners) who all work together to provide you with the care you need, when you need it.  We recommend signing up for the patient portal called "MyChart".  Sign up information is provided on this After Visit Summary.  MyChart is used to connect with patients for Virtual Visits (Telemedicine).  Patients are able to view lab/test results, encounter notes, upcoming appointments, etc.  Non-urgent messages can be sent to your provider as well.   To learn more about what you can do with MyChart, go to [email or web address].    Your next appointment:   12 month(s)  Provider:   Larae Grooms, MD     Other Instructions Check blood pressure at home and keep record of readings. Send in readings through my chart or call to the office

## 2022-06-24 LAB — CUP PACEART REMOTE DEVICE CHECK
Date Time Interrogation Session: 20240216005706
Implantable Pulse Generator Implant Date: 20231211

## 2022-06-25 ENCOUNTER — Ambulatory Visit (INDEPENDENT_AMBULATORY_CARE_PROVIDER_SITE_OTHER): Payer: Medicare PPO

## 2022-06-25 DIAGNOSIS — I480 Paroxysmal atrial fibrillation: Secondary | ICD-10-CM

## 2022-06-26 ENCOUNTER — Telehealth: Payer: Self-pay | Admitting: *Deleted

## 2022-06-26 NOTE — Progress Notes (Signed)
  Care Coordination   Note   06/26/2022 Name: [name] MRN: [id number] DOB: [birth year]  Whitney [name] is a 67 y.o. year old female who sees Lawerance Cruel, MD for primary care. I reached out to Whitney [name] by phone today to offer care coordination services.  Ms. Clowers was given information about Care Coordination services today including:   The Care Coordination services include support from the care team which includes your Nurse Coordinator, Clinical Social Worker, or Pharmacist.  The Care Coordination team is here to help remove barriers to the health concerns and goals most important to you. Care Coordination services are voluntary, and the patient may decline or stop services at any time by request to their care team member.   Care Coordination Consent Status: Patient agreed to services and verbal consent obtained.   Follow up plan:  Telephone appointment with care coordination team member scheduled for:  07/02/22  Encounter Outcome:  Pt. Scheduled  [place]  Direct Dial: [phone]

## 2022-06-28 DIAGNOSIS — Z9884 Bariatric surgery status: Secondary | ICD-10-CM | POA: Diagnosis not present

## 2022-06-28 DIAGNOSIS — E1342 Other specified diabetes mellitus with diabetic polyneuropathy: Secondary | ICD-10-CM | POA: Diagnosis not present

## 2022-06-28 DIAGNOSIS — E7800 Pure hypercholesterolemia, unspecified: Secondary | ICD-10-CM | POA: Diagnosis not present

## 2022-06-28 DIAGNOSIS — I480 Paroxysmal atrial fibrillation: Secondary | ICD-10-CM | POA: Diagnosis not present

## 2022-06-28 DIAGNOSIS — K7460 Unspecified cirrhosis of liver: Secondary | ICD-10-CM | POA: Diagnosis not present

## 2022-06-28 DIAGNOSIS — E1121 Type 2 diabetes mellitus with diabetic nephropathy: Secondary | ICD-10-CM | POA: Diagnosis not present

## 2022-06-28 DIAGNOSIS — E1142 Type 2 diabetes mellitus with diabetic polyneuropathy: Secondary | ICD-10-CM | POA: Diagnosis not present

## 2022-06-28 DIAGNOSIS — I77810 Thoracic aortic ectasia: Secondary | ICD-10-CM | POA: Diagnosis not present

## 2022-06-28 DIAGNOSIS — D696 Thrombocytopenia, unspecified: Secondary | ICD-10-CM | POA: Diagnosis not present

## 2022-06-28 DIAGNOSIS — E559 Vitamin D deficiency, unspecified: Secondary | ICD-10-CM | POA: Diagnosis not present

## 2022-06-28 DIAGNOSIS — Z0000 Encounter for general adult medical examination without abnormal findings: Secondary | ICD-10-CM | POA: Diagnosis not present

## 2022-07-02 ENCOUNTER — Ambulatory Visit: Payer: Self-pay

## 2022-07-02 NOTE — Patient Instructions (Signed)
Visit Information  Thank you for taking time to visit with me today. Please don't hesitate to contact me if I can be of assistance to you.   Following are the goals we discussed today:   Goals Addressed             This Visit's Progress    Care Coordination Activities-Improve nutrition and fluid intake       Interventions Today    Flowsheet Row Most Recent Value  Chronic Disease   Chronic disease during today's visit Atrial Fibrillation (AFib), Other  [NASH, S/P CVA]  General Interventions   General Interventions Discussed/Reviewed General Interventions Discussed, Doctor Visits, Durable Medical Equipment (DME)  Doctor Visits Discussed/Reviewed Doctor Visits Discussed, Annual Wellness Visits, PCP, Specialist  Durable Medical Equipment (DME) Other  [CPAP]  PCP/Specialist Visits Compliance with follow-up visit  Exercise Interventions   Exercise Discussed/Reviewed Exercise Discussed  Education Interventions   Education Provided Provided Printed Education, Provided Education  Provided Verbal Education On Nutrition, Other  [atrial fib]  Nutrition Interventions   Nutrition Discussed/Reviewed Nutrition Discussed, Portion sizes, Increaing proteins, Decreasing salt  [Discussed drinking adequate amounts of fluids]  Pharmacy Interventions   Pharmacy Dicussed/Reviewed Medications and their functions, Pharmacy Topics Discussed              Our next appointment is by telephone on 07/31/22 at 11 AM  Please call the care guide team at [phone] if you need to cancel or reschedule your appointment.   If you are experiencing a Mental Health or [place] or need someone to talk to, please call the Suicide and Crisis Lifeline: 988 call the [place] National Suicide Prevention Lifeline: 320-711-4805 or TTY: (713)365-1801 TTY (684)385-3438) to talk to a trained counselor call 1-800-273-TALK (toll free, 24 hour hotline) go to [facility] [address], [place] [phone]) call 911   Patient verbalizes understanding of instructions and care plan provided today and agrees to view in [place]. Active MyChart status and patient understanding of how to access instructions and care plan via MyChart confirmed with patient.     Telephone follow up appointment with care management team member scheduled for: 07/31/22  Peter Garter RN, [place] [place], [place] [facility] [phone]

## 2022-07-02 NOTE — Patient Outreach (Signed)
  Care Coordination   Initial Visit Note   07/02/2022 Name: Whitney [name] MRN: [id number] DOB: July 06, 1955  Whitney [name] is a 67 y.o. year old female who sees Lawerance Cruel, MD for primary care. I spoke with  Whitney [name] by phone today.  What matters to the patients health and wellness today?  I have so many issues that I see doctors.  I would like to improve my eating by eating enough protein, drink more fluids and not skip meals. States she saw Dr. Harrington Challenger last week and she had a much better visit with him as is is getting more familiar with her case.      Goals Addressed             This Visit's Progress    Care Coordination Activities-Improve nutrition and fluid intake       Interventions Today    Flowsheet Row Most Recent Value  Chronic Disease   Chronic disease during today's visit Atrial Fibrillation (AFib), Other  [NASH, S/P CVA]  General Interventions   General Interventions Discussed/Reviewed General Interventions Discussed, Doctor Visits, Durable Medical Equipment (DME)  Doctor Visits Discussed/Reviewed Doctor Visits Discussed, Annual Wellness Visits, PCP, Specialist  Durable Medical Equipment (DME) Other  [CPAP]  PCP/Specialist Visits Compliance with follow-up visit  Exercise Interventions   Exercise Discussed/Reviewed Exercise Discussed  Education Interventions   Education Provided Provided Printed Education, Provided Education  Provided Verbal Education On Nutrition, Other  [atrial fib]  Nutrition Interventions   Nutrition Discussed/Reviewed Nutrition Discussed, Portion sizes, Increaing proteins, Decreasing salt  [Discussed drinking adequate amounts of fluids]  Pharmacy Interventions   Pharmacy Dicussed/Reviewed Medications and their functions, Pharmacy Topics Discussed              SDOH assessments and interventions completed:  Yes  SDOH Interventions Today    Flowsheet Row Most Recent Value  SDOH Interventions   Food Insecurity Interventions  Intervention Not Indicated  Housing Interventions Intervention Not Indicated  Transportation Interventions Intervention Not Indicated  Utilities Interventions Intervention Not Indicated        Care Coordination Interventions:  Yes, provided   Follow up plan: Follow up call scheduled for 07/31/22    Encounter Outcome:  Pt. Visit Completed  Peter Garter RN, [facility], [place] Management [phone]

## 2022-07-09 ENCOUNTER — Ambulatory Visit: Payer: Medicare PPO | Attending: Internal Medicine | Admitting: Internal Medicine

## 2022-07-09 ENCOUNTER — Encounter: Payer: Self-pay | Admitting: Internal Medicine

## 2022-07-09 VITALS — BP 122/86 | HR 73 | Ht 64.0 in | Wt 222.0 lb

## 2022-07-09 DIAGNOSIS — D6869 Other thrombophilia: Secondary | ICD-10-CM

## 2022-07-09 DIAGNOSIS — I480 Paroxysmal atrial fibrillation: Secondary | ICD-10-CM | POA: Diagnosis not present

## 2022-07-09 MED ORDER — APIXABAN 5 MG PO TABS: 5.0000 mg | ORAL_TABLET | Freq: Two times a day (BID) | ORAL | 3 refills | Status: DC

## 2022-07-09 NOTE — Patient Instructions (Addendum)
Medication Instructions:  Your physician recommends that you continue on your current medications as directed. Please refer to the Current Medication list given to you today.  Your Eliquis [facility] today, 07/09/2022.    LABS None ordered.  If you have labs (blood work) drawn today and your tests are completely normal, you will receive your results only by: [place] (if you have MyChart) OR A paper copy in the mail If you have any lab test that is abnormal or we need to change your treatment, we will call you to review the results.  Testing/Procedures: None ordered.  Follow-Up: At [facility], you and your health needs are our priority.  As part of our continuing mission to provide you with exceptional heart care, we have created designated Provider Care Teams.  These Care Teams include your primary Cardiologist (physician) and Advanced Practice Providers (APPs -  Physician Assistants and Nurse Practitioners) who all work together to provide you with the care you need, when you need it.  Your next appointment:   1 year(s)  The format for your next appointment:   In Person  Provider:   Virl Axe, MD{or one of the following Advanced Practice Providers on your designated Care Team:   Tommye Standard, [place] Le[place] "[facility]" [place], [place]

## 2022-07-09 NOTE — Progress Notes (Signed)
Patient Care Team: Lawerance Cruel, MD as PCP - General (Family Medicine) [name], MD as PCP - Cardiology (Cardiology) Neldon Labella, RN as Case Manager Dimitri Ped, RN as [place] Management   HPI  Whitney [name] is a 67 y.o. female Seen in follow-up for a loop recorder implanted following cryptogenic stroke 12/23.  Past medical history is notable for aortic stenosis status post tissue AVR, NASH, and chronic thrombocytopenia.  Demonstrated atrial fibrillation and she was started on anticoagulation.  Seen  1/24 with an interval TIA  Recovered.  No dyspnea.  Occasional discomfort over her loop site.  Superficial bleeding  Records and Results Reviewed  Past Medical History:  Diagnosis Date   Allergic rhinitis    Anemia    hx   Arthritis    Asthma    hx yrs ago   Cirrhosis ([place]) last albumin 3.3 done at [place] 06-16-2014 (under care everywhere tab in epic)   Secondary to Fatty liver --  followed by hepatology at [place] (dr Gerald Dexter)    Depression    Diabetes mellitus type II    type 2 diet conrolled   Dyspnea    Fibromyalgia    GERD (gastroesophageal reflux disease)    Heart murmur    asymptomatic ---  1989 from rhuematic fever   History of exercise stress test    05-05-2013---  negative bruce ETT given exercise workload,  no ischemia   History of hiatal hernia    History of kidney stones    History of rheumatic fever    1989   Hyperlipidemia    Leukocytopenia    Moderate aortic stenosis    AVA area 1.1cm2---  cardiologist --  dr Concepcion Living, 2014 in epic   NASH (nonalcoholic steatohepatitis)    OSA (obstructive sleep apnea)    was using CPAP before gastric sleeve 2015--  no uses after wt loss   Pneumonia    hx   Sjogren's syndrome ([place])    Thrombocytopenia (HCC)     Past Surgical History:  Procedure Laterality Date   AORTIC VALVE REPLACEMENT N/A 12/05/2017   Procedure: AORTIC VALVE REPLACEMENT (AVR) TISSUE  VALVE 21MM INSPIRIS;  Surgeon: Gaye Pollack, MD;  Location: [place];  Service: Open Heart Surgery;  Laterality: N/A;   COLONOSCOPY WITH ESOPHAGOGASTRODUODENOSCOPY (EGD)     CYSTOSCOPY WITH RETROGRADE PYELOGRAM, URETEROSCOPY AND STENT PLACEMENT Left 01/21/2015   Procedure: CYSTOSCOPY WITH LEFT  RETROGRADE PYELOGRAM, LEFT URETEROSCOPY AND STENT PLACEMENT;  Surgeon: Festus Aloe, MD;  Location: [facility];  Service: Urology;  Laterality: Left;   CYSTOSCOPY WITH RETROGRADE PYELOGRAM, URETEROSCOPY AND STENT PLACEMENT Bilateral 02/24/2015   Procedure: CYSTOSCOPY WITH RIGHT RETROGRADE PYELOGRAM, BLADDER BIOPSY FULGERATION LEFT URETEROSCOPY AND STENT REPLACEMENT;  Surgeon: Festus Aloe, MD;  [facility]: [facility];  Service: Urology;  Laterality: Bilateral;   EXPLORATORY LAPAROSCOPY W/  CONE BIOPSY'S LEFT AND RIGHT LOBE OF LIVER  11-04-2007   HOLMIUM LASER APPLICATION Left [id number]   Procedure: HOLMIUM LASER LITHOTRIPSY;  Surgeon: Festus Aloe, MD;  Location[facility];  Service: Urology;  Laterality: Left;   HYSTEROSCOPY WITH D & C N/A 12/11/2012   Procedure: DILATATION AND CURETTAGE /HYSTEROSCOPY;  Surgeon: Maeola Sarah. Landry Mellow, MD;  Location: [facility];  Service: Gynecology;  Laterality: N/A;   INGUINAL HERNIA REPAIR Left 10/22/2016   Procedure: LEFT INGUINAL HERNIA REPAIR;  Surgeon: Rolm Bookbinder, MD;  Location: [place];  Service: General;  Laterality: Left;  TAP BLOCK   INSERTION OF MESH Left 10/22/2016   Procedure: INSERTION OF MESH;  Surgeon: Rolm Bookbinder, MD;  Location: [place];  Service: General;  Laterality: Left;   LAPAROSCOPIC GASTRIC SLEEVE RESECTION  07-27-2013   LOOP RECORDER INSERTION N/A 04/16/2022   Procedure: LOOP RECORDER INSERTION;  Surgeon: Deboraha Sprang, MD;  Location: [place] [facility];  Service: Cardiovascular;  Laterality: N/A;   PUBOVAGINAL SLING  04-10-2001   [place]   RIGHT/LEFT HEART CATH AND CORONARY ANGIOGRAPHY N/A  08/23/2017   Procedure: RIGHT/LEFT HEART CATH AND CORONARY ANGIOGRAPHY;  Surgeon: [name], MD;  Location: [place] [facility];  Service: Cardiovascular;  Laterality: N/A;   TEE WITHOUT CARDIOVERSION N/A 12/05/2017   Procedure: TRANSESOPHAGEAL ECHOCARDIOGRAM (TEE);  Surgeon: Gaye Pollack, MD;  Location: [place];  Service: Open Heart Surgery;  Laterality: N/A;   TONSILLECTOMY  1975   TRANSTHORACIC ECHOCARDIOGRAM  06-04-2012  dr Irish Lack   mild LVH,  grade I diastolic dysfunction/  ef 60-65%/  moderate LAE/  mild MV calcifation without stenosis,  mild MR/  moderate AV stenosis,  cannot r/o bicupsid, area 1.1cm2/  mild dilated aortic root/  trivial TR    No outpatient medications have been marked as taking for the 07/09/22 encounter (Appointment) with Deboraha Sprang, MD.    Allergies  Allergen Reactions   Doxycycline Hives and Rash   Naproxen Rash and Hives      Review of Systems negative except from HPI and PMH  Physical Exam There were no vitals taken for this visit. Well developed and well nourished in no acute distress HENT normal E scleral and icterus clear Neck Supple JVP flat; carotids brisk and full Clear to auscultation Device pocket well healed; without hematoma or erythema.  There is no tethering   Regular rate and rhythm, no murmurs gallops or rub Soft with active bowel sounds No clubbing cyanosis  Edema Alert and oriented, grossly normal motor and sensory function Skin Warm and Dry  ECG sinus at 73 Interval 16/10/42  CrCl cannot be calculated (Patient's most recent lab result is older than the maximum 21 days allowed.).   Assessment and  Plan Cryptogenic stroke  Implantable loop recorder>>> atrial fibrillation  Aortic stenosis status post tissue AVR  NASH  Chronic thrombocytopenia    Recurrent TIA, on Eliquis.  Minor superficial bleeding.  Will continue.  I have reached out to Dr. Lorenso Courier the hematologist who saw her for the thrombocytopenia in  the context of her NASH, to ask him to think with [place] as to the relative merits of anticoagulation versus antiplatelet therapy the latter in conjunction with watchman.  She was telling me also about her son's had multiple surgeries and is currently back in the hospital at [facility]       Current medicines are reviewed at length with the patient today .  The patient does not have concerns regarding medicines.

## 2022-07-10 ENCOUNTER — Telehealth: Payer: Self-pay | Admitting: Hematology and Oncology

## 2022-07-10 NOTE — Telephone Encounter (Signed)
Called patient per 3/4 IB message to schedule f/u appointment in 2/3 weeks. Patient scheduled and notified.

## 2022-07-11 NOTE — Progress Notes (Signed)
Carelink Summary Report / Loop Recorder 

## 2022-07-12 ENCOUNTER — Ambulatory Visit: Payer: Medicare PPO | Admitting: Interventional Cardiology

## 2022-07-16 DIAGNOSIS — E119 Type 2 diabetes mellitus without complications: Secondary | ICD-10-CM | POA: Diagnosis not present

## 2022-07-16 DIAGNOSIS — H2513 Age-related nuclear cataract, bilateral: Secondary | ICD-10-CM | POA: Diagnosis not present

## 2022-07-16 DIAGNOSIS — M3501 Sicca syndrome with keratoconjunctivitis: Secondary | ICD-10-CM | POA: Diagnosis not present

## 2022-07-30 ENCOUNTER — Ambulatory Visit (INDEPENDENT_AMBULATORY_CARE_PROVIDER_SITE_OTHER): Payer: Medicare PPO

## 2022-07-30 DIAGNOSIS — I480 Paroxysmal atrial fibrillation: Secondary | ICD-10-CM

## 2022-07-30 LAB — CUP PACEART REMOTE DEVICE CHECK
Date Time Interrogation Session: 20240324230746
Implantable Pulse Generator Implant Date: 20231211

## 2022-07-31 ENCOUNTER — Ambulatory Visit: Payer: Self-pay

## 2022-07-31 NOTE — Patient Instructions (Signed)
Visit Information  Thank you for taking time to visit with me today. Please don't hesitate to contact me if I can be of assistance to you.   Following are the goals we discussed today:   Goals Addressed             This Visit's Progress    Care Coordination Activities-Improve nutrition and fluid intake       Interventions Today    Flowsheet Row Most Recent Value  General Interventions   General Interventions Discussed/Reviewed General Interventions Reviewed, Doctor Visits  [Declines needing any further RNCM follow up]  Doctor Visits Discussed/Reviewed Doctor Visits Reviewed, PCP  PCP/Specialist Visits Compliance with follow-up visit  Education Interventions   Education Provided Provided Education  Provided Verbal Education On Other, When to see the doctor  [care coordination services]  Nutrition Interventions   Nutrition Discussed/Reviewed Nutrition Reviewed, Increaing proteins     Plan to close case no further RNCM follow up needed Pt has engaged with [facility] pharmacist           If you are experiencing a Mental Health or [place] or need someone to talk to, please call the Suicide and Crisis Lifeline: 988 call the [place] National Suicide Prevention Lifeline: (630)083-0479 or TTY: 432-157-9319 TTY 720-500-1463) to talk to a trained counselor call 1-800-273-TALK (toll free, 24 hour hotline) go to [facility] [place] [phone]) call 911   Patient verbalizes understanding of instructions and care plan provided today and agrees to view in [place]. Active MyChart status and patient understanding of how to access instructions and care plan via MyChart confirmed with patient.     No further follow up required:    [place], Jackquline Denmark, [place] Management [phone]

## 2022-07-31 NOTE — Patient Outreach (Signed)
  Care Coordination   07/31/2022 Name: Whitney [name] MRN: [id number] DOB: [birth year]   Care Coordination Outreach Attempts:  An unsuccessful telephone outreach was attempted for a scheduled appointment today.  Follow Up Plan:  Additional outreach attempts will be made to offer the patient care coordination information and services.   Encounter Outcome:  No Answer   Care Coordination Interventions:  No, not indicated    SIG  Peter Garter RN, BSN,CCM, CDE Care Management Coordinator [place] Management [phone]

## 2022-07-31 NOTE — Patient Outreach (Signed)
  Care Coordination   Follow Up Visit Note   07/31/2022 Name: Whitney [name] MRN: [id number] DOB: [birth year]  Whitney [name] is a 67 y.o. year old female who sees Lawerance Cruel, MD for primary care. I spoke with  Whitney [name] by phone today.  What matters to the patients health and wellness today?  Pt states was talking to the pharmacist at [facility] office earlier today. States she feels she is doing good and she know what to do for herself    Goals Addressed             This Visit's Progress    Care Coordination Activities-Improve nutrition and fluid intake       Interventions Today    Flowsheet Row Most Recent Value  General Interventions   General Interventions Discussed/Reviewed General Interventions Reviewed, Doctor Visits  [Declines needing any further RNCM follow up]  Doctor Visits Discussed/Reviewed Doctor Visits Reviewed, PCP  PCP/Specialist Visits Compliance with follow-up visit  Education Interventions   Education Provided Provided Education  Provided Verbal Education On Other, When to see the doctor  [care coordination services]  Nutrition Interventions   Nutrition Discussed/Reviewed Nutrition Reviewed, Increaing proteins     Plan to close case no further RNCM follow up needed Pt has engaged with [facility] pharmacist         SDOH assessments and interventions completed:  No     Care Coordination Interventions:  Yes, provided   Follow up plan: No further intervention required. Pt now [facility] pt    Encounter Outcome:  Pt. Visit Completed  Peter Garter RN, BSN,CCM, CDE Care Management Coordinator [place] Management [phone]

## 2022-08-02 ENCOUNTER — Inpatient Hospital Stay: Payer: Medicare PPO | Attending: Hematology and Oncology

## 2022-08-02 ENCOUNTER — Inpatient Hospital Stay: Payer: Medicare PPO | Admitting: Hematology and Oncology

## 2022-08-02 ENCOUNTER — Other Ambulatory Visit: Payer: Self-pay | Admitting: Hematology and Oncology

## 2022-08-02 VITALS — BP 144/82 | HR 70 | Temp 97.9°F | Resp 20 | Wt 222.3 lb

## 2022-08-02 DIAGNOSIS — Z952 Presence of prosthetic heart valve: Secondary | ICD-10-CM | POA: Diagnosis not present

## 2022-08-02 DIAGNOSIS — D72819 Decreased white blood cell count, unspecified: Secondary | ICD-10-CM | POA: Diagnosis not present

## 2022-08-02 DIAGNOSIS — K7460 Unspecified cirrhosis of liver: Secondary | ICD-10-CM | POA: Diagnosis not present

## 2022-08-02 DIAGNOSIS — Z886 Allergy status to analgesic agent status: Secondary | ICD-10-CM | POA: Insufficient documentation

## 2022-08-02 DIAGNOSIS — Z87442 Personal history of urinary calculi: Secondary | ICD-10-CM | POA: Insufficient documentation

## 2022-08-02 DIAGNOSIS — Z9884 Bariatric surgery status: Secondary | ICD-10-CM | POA: Diagnosis not present

## 2022-08-02 DIAGNOSIS — Z8249 Family history of ischemic heart disease and other diseases of the circulatory system: Secondary | ICD-10-CM | POA: Insufficient documentation

## 2022-08-02 DIAGNOSIS — D696 Thrombocytopenia, unspecified: Secondary | ICD-10-CM

## 2022-08-02 DIAGNOSIS — Z7901 Long term (current) use of anticoagulants: Secondary | ICD-10-CM | POA: Diagnosis not present

## 2022-08-02 DIAGNOSIS — E785 Hyperlipidemia, unspecified: Secondary | ICD-10-CM | POA: Insufficient documentation

## 2022-08-02 DIAGNOSIS — Z825 Family history of asthma and other chronic lower respiratory diseases: Secondary | ICD-10-CM | POA: Insufficient documentation

## 2022-08-02 DIAGNOSIS — Z881 Allergy status to other antibiotic agents status: Secondary | ICD-10-CM | POA: Diagnosis not present

## 2022-08-02 DIAGNOSIS — Z8261 Family history of arthritis: Secondary | ICD-10-CM | POA: Insufficient documentation

## 2022-08-02 DIAGNOSIS — Z79899 Other long term (current) drug therapy: Secondary | ICD-10-CM | POA: Insufficient documentation

## 2022-08-02 DIAGNOSIS — G4733 Obstructive sleep apnea (adult) (pediatric): Secondary | ICD-10-CM | POA: Insufficient documentation

## 2022-08-02 DIAGNOSIS — G629 Polyneuropathy, unspecified: Secondary | ICD-10-CM | POA: Diagnosis not present

## 2022-08-02 LAB — CMP (CANCER CENTER ONLY)
ALT: 8 U/L (ref 0–44)
AST: 24 U/L (ref 15–41)
Albumin: 3.2 g/dL — ABNORMAL LOW (ref 3.5–5.0)
Alkaline Phosphatase: 66 U/L (ref 38–126)
Anion gap: 4 — ABNORMAL LOW (ref 5–15)
BUN: 17 mg/dL (ref 8–23)
CO2: 31 mmol/L (ref 22–32)
Calcium: 8.9 mg/dL (ref 8.9–10.3)
Chloride: 104 mmol/L (ref 98–111)
Creatinine: 0.75 mg/dL (ref 0.44–1.00)
GFR, Estimated: >60 mL/min (ref >60)
Glucose, Bld: 83 mg/dL (ref 70–99)
Potassium: 4.4 mmol/L (ref 3.5–5.1)
Sodium: 139 mmol/L (ref 135–145)
Total Bilirubin: 1.4 mg/dL — ABNORMAL HIGH (ref 0.3–1.2)
Total Protein: 6.4 g/dL — ABNORMAL LOW (ref 6.5–8.1)

## 2022-08-02 LAB — CBC WITH DIFFERENTIAL (CANCER CENTER ONLY)
Abs Immature Granulocytes: 0 10*3/uL (ref 0.00–0.07)
Basophils Absolute: 0 10*3/uL (ref 0.0–0.1)
Basophils Relative: 1 %
Eosinophils Absolute: 0.1 10*3/uL (ref 0.0–0.5)
Eosinophils Relative: 4 %
HCT: 42.1 % (ref 36.0–46.0)
Hemoglobin: 14.5 g/dL (ref 12.0–15.0)
Immature Granulocytes: 0 %
Lymphocytes Relative: 38 %
Lymphs Abs: 1.2 10*3/uL (ref 0.7–4.0)
MCH: 31.3 pg (ref 26.0–34.0)
MCHC: 34.4 g/dL (ref 30.0–36.0)
MCV: 90.9 fL (ref 80.0–100.0)
Monocytes Absolute: 0.5 10*3/uL (ref 0.1–1.0)
Monocytes Relative: 14 %
Neutro Abs: 1.4 10*3/uL — ABNORMAL LOW (ref 1.7–7.7)
Neutrophils Relative %: 43 %
Platelet Count: 71 10*3/uL — ABNORMAL LOW (ref 150–400)
RBC: 4.63 MIL/uL (ref 3.87–5.11)
RDW: 14.1 % (ref 11.5–15.5)
WBC Count: 3.1 10*3/uL — ABNORMAL LOW (ref 4.0–10.5)
nRBC: 0 % (ref 0.0–0.2)

## 2022-08-02 LAB — APTT: aPTT: 41 seconds — ABNORMAL HIGH (ref 24–36)

## 2022-08-02 LAB — PROTIME-INR
INR: 1.5 — ABNORMAL HIGH (ref 0.8–1.2)
Prothrombin Time: 17.6 seconds — ABNORMAL HIGH (ref 11.4–15.2)

## 2022-08-02 NOTE — Progress Notes (Signed)
[place] [phone]   [phone]  PROGRESS NOTE  Patient Care Team: Lawerance Cruel, MD as PCP - General (Family Medicine) [name], MD as PCP - Cardiology (Cardiology)  Hematological/Oncological History # Leukopenia/Thrombocytopenia in Setting of Cirrhosis 03/21/2022: establish care with Dr. Lorenso Courier   Interval History:  Whitney [name] 67 y.o. female with medical history significant for thrombocytopenia in the setting of cirrhosis who presents for a follow up visit. The patient's last visit was on 03/21/2022 at which time she established care. In the interim since the last visit she was seen by cardiology who requested our evaluation for consideration of anticoagulation therapy.  On exam today Whitney [name] reports that she has been having difficulty with her son who she has been taking to [facility] for treatment.  She notes that he is currently on TPN therapy.  She notes that a lot of her life is up in the air because of her taking care of other folks.  She reports that she continues to take her Eliquis therapy and has not been having any trouble with bleeding, bruising, or dark stools.  She notes that she has been having some issues with neuropathy for which she has been referred to a neurologist.  She reports that she continues to have fatigue and her energy levels are quite low.  She notes that the Eliquis therapy is also continue to be affordable.  Overall she is at her baseline level of health but is concerned about recent weight gain.  She denies any fevers, chills, sweats, nausea, vomiting or diarrhea.  A full 10 point ROS is listed below.  MEDICAL HISTORY:  Past Medical History:  Diagnosis Date   Allergic rhinitis    Anemia    hx   Arthritis    Asthma    hx yrs ago   Cirrhosis ([place]) last albumin 3.3 done at [place] 06-16-2014 (under care everywhere tab in epic)   Secondary to Fatty liver --  followed by hepatology at [place] (dr Gerald Dexter)     Depression    Diabetes mellitus type II    type 2 diet conrolled   Dyspnea    Fibromyalgia    GERD (gastroesophageal reflux disease)    Heart murmur    asymptomatic ---  1989 from rhuematic fever   History of exercise stress test    05-05-2013---  negative bruce ETT given exercise workload,  no ischemia   History of hiatal hernia    History of kidney stones    History of rheumatic fever    1989   Hyperlipidemia    Leukocytopenia    Moderate aortic stenosis    AVA area 1.1cm2---  cardiologist --  dr Concepcion Living, 2014 in epic   NASH (nonalcoholic steatohepatitis)    OSA (obstructive sleep apnea)    was using CPAP before gastric sleeve 2015--  no uses after wt loss   Pneumonia    hx   Sjogren's syndrome ([place])    Thrombocytopenia (HCC)     SURGICAL HISTORY: Past Surgical History:  Procedure Laterality Date   AORTIC VALVE REPLACEMENT N/A 12/05/2017   Procedure: AORTIC VALVE REPLACEMENT (AVR) TISSUE VALVE 21MM INSPIRIS;  Surgeon: Gaye Pollack, MD;  Location: [place];  Service: Open Heart Surgery;  Laterality: N/A;   COLONOSCOPY WITH ESOPHAGOGASTRODUODENOSCOPY (EGD)     CYSTOSCOPY WITH RETROGRADE PYELOGRAM, URETEROSCOPY AND STENT PLACEMENT Left 01/21/2015   Procedure: CYSTOSCOPY WITH LEFT  RETROGRADE PYELOGRAM, LEFT URETEROSCOPY AND STENT PLACEMENT;  Surgeon: Festus Aloe, MD;  Location[facility];  Service: Urology;  Laterality: Left;   CYSTOSCOPY WITH RETROGRADE PYELOGRAM, URETEROSCOPY AND STENT PLACEMENT Bilateral 02/24/2015   Procedure: CYSTOSCOPY WITH RIGHT RETROGRADE PYELOGRAM, BLADDER BIOPSY FULGERATION LEFT URETEROSCOPY AND STENT REPLACEMENT;  Surgeon: Festus Aloe, MD;  Location: [facility];  Service: Urology;  Laterality: Bilateral;   EXPLORATORY LAPAROSCOPY W/  CONE BIOPSY'S LEFT AND RIGHT LOBE OF LIVER  11-04-2007   HOLMIUM LASER APPLICATION Left [id number]   Procedure: HOLMIUM LASER LITHOTRIPSY;  Surgeon: Festus Aloe, MD;   Location: [facility];  Service: Urology;  Laterality: Left;   HYSTEROSCOPY WITH D & C N/A 12/11/2012   Procedure: DILATATION AND CURETTAGE /HYSTEROSCOPY;  Surgeon: Maeola Sarah. Landry Mellow, MD;  Location: [facility];  Service: Gynecology;  Laterality: N/A;   INGUINAL HERNIA REPAIR Left 10/22/2016   Procedure: LEFT INGUINAL HERNIA REPAIR;  Surgeon: Rolm Bookbinder, MD;  Location: [place];  Service: General;  Laterality: Left;  TAP BLOCK   INSERTION OF MESH Left 10/22/2016   Procedure: INSERTION OF MESH;  Surgeon: Rolm Bookbinder, MD;  Location: [place];  Service: General;  Laterality: Left;   LAPAROSCOPIC GASTRIC SLEEVE RESECTION  07-27-2013   LOOP RECORDER INSERTION N/A 04/16/2022   Procedure: LOOP RECORDER INSERTION;  Surgeon: Deboraha Sprang, MD;  Location: [place] [facility];  Service: Cardiovascular;  Laterality: N/A;   PUBOVAGINAL SLING  04-10-2001   [place]   RIGHT/LEFT HEART CATH AND CORONARY ANGIOGRAPHY N/A 08/23/2017   Procedure: RIGHT/LEFT HEART CATH AND CORONARY ANGIOGRAPHY;  Surgeon: [name], MD;  Location: [place] [facility];  Service: Cardiovascular;  Laterality: N/A;   TEE WITHOUT CARDIOVERSION N/A 12/05/2017   Procedure: TRANSESOPHAGEAL ECHOCARDIOGRAM (TEE);  Surgeon: Gaye Pollack, MD;  Location: [place];  Service: Open Heart Surgery;  Laterality: N/A;   TONSILLECTOMY  1975   TRANSTHORACIC ECHOCARDIOGRAM  06-04-2012  dr Irish Lack   mild LVH,  grade I diastolic dysfunction/  ef 60-65%/  moderate LAE/  mild MV calcifation without stenosis,  mild MR/  moderate AV stenosis,  cannot r/o bicupsid, area 1.1cm2/  mild dilated aortic root/  trivial TR    SOCIAL HISTORY: Social History   Socioeconomic History   Marital status: Widowed    Spouse name: Married to [employer]   Number of children: Not on file   Years of education: Not on file   Highest education level: Not on file  Occupational History   Occupation: principal of [employer]  Tobacco Use   Smoking status:  Never   Smokeless tobacco: Never   Tobacco comments:    Never smoke 05/08/22  Vaping Use   Vaping Use: Never used  Substance and Sexual Activity   Alcohol use: Yes    Comment: rarely   Drug use: No   Sexual activity: Not on file  Other Topics Concern   Not on file  Social History Narrative   Not on file   Social Determinants of Health   Financial Resource Strain: Not on file  Food Insecurity: No Food Insecurity (07/02/2022)   Hunger Vital Sign    Worried About Running Out of Food in the Last Year: Never true    Ran Out of Food in the Last Year: Never true  Transportation Needs: No Transportation Needs (07/02/2022)   PRAPARE - Hydrologist (Medical): No    Lack of Transportation (Non-Medical): No  Physical Activity: Not on file  Stress: Not  on file  Social Connections: Not on file  Intimate Partner Violence: Not At Risk (04/14/2022)   Humiliation, Afraid, Rape, and Kick questionnaire    Fear of Current or Ex-Partner: No    Emotionally Abused: No    Physically Abused: No    Sexually Abused: No    FAMILY HISTORY: Family History  Problem Relation Age of Onset   Alzheimer's disease Father    Hip fracture Father    Asthma Father    Heart disease Father    Heart attack Father    Hypertension Father    Rheum arthritis Mother    Heart disease Mother    Allergies Daughter    Asthma Paternal Grandmother    Asthma Daughter    Stroke Neg Hx     ALLERGIES:  is allergic to doxycycline and naproxen.  MEDICATIONS:  Current Outpatient Medications  Medication Sig Dispense Refill   albuterol (VENTOLIN HFA) 108 (90 Base) MCG/ACT inhaler INHALE 2 PUFFS INTO THE LUNGS EVERY 4 HOURS AS NEEDED FOR WHEEZING OR SHORTNESS OF BREATH. 8.5 each 5   amoxicillin (AMOXIL) 500 MG capsule Take 4 capsules 1 hour prior to any dental procedures 4 capsule 3   apixaban (ELIQUIS) 5 MG TABS tablet Take 1 tablet (5 mg total) by mouth 2 (two) times daily. 90 tablet 3    ezetimibe (ZETIA) 10 MG tablet Take 10 mg by mouth at bedtime.   11   magnesium oxide (MAG-OX) 400 MG tablet Take 400 mg by mouth daily.     milk thistle 175 MG tablet Take 175 mg by mouth See admin instructions. Take 175 mg by mouth every 2-3 days     nadolol (CORGARD) 40 MG tablet Take 1 tablet (40 mg total) by mouth daily. 30 tablet 6   OZEMPIC, 0.25 OR 0.5 MG/DOSE, 2 MG/3ML SOPN Inject 2 mg into the skin once a week.     Tiotropium Bromide-Olodaterol (STIOLTO RESPIMAT) 2.5-2.5 MCG/ACT AERS Inhale 2 puffs into the lungs daily. INHALE 2 PUFFS BY MOUTH INTO THE LUNGS DAILY 3 each 3   XIIDRA 5 % SOLN Place 1 drop into both eyes daily.     No current facility-administered medications for this visit.    REVIEW OF SYSTEMS:   Constitutional: ( - ) fevers, ( - )  chills , ( - ) night sweats Eyes: ( - ) blurriness of vision, ( - ) double vision, ( - ) watery eyes Ears, nose, mouth, throat, and face: ( - ) mucositis, ( - ) sore throat Respiratory: ( - ) cough, ( - ) dyspnea, ( - ) wheezes Cardiovascular: ( - ) palpitation, ( - ) chest discomfort, ( - ) lower extremity swelling Gastrointestinal:  ( - ) nausea, ( - ) heartburn, ( - ) change in bowel habits Skin: ( - ) abnormal skin rashes Lymphatics: ( - ) new lymphadenopathy, ( - ) easy bruising Neurological: ( - ) numbness, ( - ) tingling, ( - ) new weaknesses Behavioral/Psych: ( - ) mood change, ( - ) new changes  All other systems were reviewed with the patient and are negative.  PHYSICAL EXAMINATION:  Vitals:   08/02/22 1127  BP: (!) 144/82  Pulse: 70  Resp: 20  Temp: 97.9 F (36.6 C)  SpO2: 94%   Filed Weights   08/02/22 1127  Weight: 222 lb 4.8 oz (100.8 kg)    GENERAL: Well-appearing elderly Caucasian female, alert, no distress and comfortable SKIN: skin color, texture, turgor are normal, no  rashes or significant lesions EYES: conjunctiva are pink and non-injected, sclera clear LUNGS: clear to auscultation and percussion  with normal breathing effort HEART: regular rate & rhythm and no murmurs and no lower extremity edema Musculoskeletal: no cyanosis of digits and no clubbing  PSYCH: alert & oriented x 3, fluent speech NEURO: no focal motor/sensory deficits  LABORATORY DATA:  I have reviewed the data as listed    Latest Ref Rng & Units 08/02/2022   10:51 AM 06/07/2022    9:47 AM 05/23/2022    6:56 PM  CBC  WBC 4.0 - 10.5 K/uL 3.1  2.5    Hemoglobin 12.0 - 15.0 g/dL 14.5  13.8  14.3   Hematocrit 36.0 - 46.0 % 42.1  42.7  42.0   Platelets 150 - 400 K/uL 71  58         Latest Ref Rng & Units 08/02/2022   10:51 AM 05/23/2022    6:56 PM 05/23/2022    6:40 PM  CMP  Glucose 70 - 99 mg/dL 83  112  115   BUN 8 - 23 mg/dL 17  14  12    Creatinine 0.44 - 1.00 mg/dL 0.75  0.70  0.79   Sodium 135 - 145 mmol/L 139  138  137   Potassium 3.5 - 5.1 mmol/L 4.4  4.1  4.1   Chloride 98 - 111 mmol/L 104  102  102   CO2 22 - 32 mmol/L 31   26   Calcium 8.9 - 10.3 mg/dL 8.9   8.5   Total Protein 6.5 - 8.1 g/dL 6.4   5.9   Total Bilirubin 0.3 - 1.2 mg/dL 1.4   1.0   Alkaline Phos 38 - 126 U/L 66   63   AST 15 - 41 U/L 24   31   ALT 0 - 44 U/L 8   13    RADIOGRAPHIC STUDIES: CUP PACEART REMOTE DEVICE CHECK  Result Date: 07/30/2022 ILR summary report received. Battery status OK. Normal device function. No new symptom, tachy, brady, or pause episodes. No new AF episodes. Monthly summary reports and ROV/PRN. [facility], [facility].   ASSESSMENT & PLAN Whitney [name] 67 y.o. female with medical history significant for thrombocytopenia in the setting of cirrhosis who presents for a follow up visit.  # Anticoagulation Recommendations in setting of Atrial Fibrillation -- Today we had a detailed discussion about recommendations moving forward regarding anticoagulation therapy.  We discussed Eliquis versus antiplatelet therapy with a Watchman device. -- At this time my recommendation would be for Eliquis therapy for a number of reasons.   For starters Eliquis has a relatively low bleeding risk compared to other anticoagulation therapy.  Additionally if her platelets were to drop below 50 we could reduce the dose of Eliquis to 2.5 mg twice daily and continue as long as Plt were >25.  That would not be recommended with aspirin/plavix based therapy. -- Patient currently has platelet count steadily above 50 which means she is appropriate for therapy with Eliquis.  I do worry about the increased risk of bleeding with antiplatelet therapy given her thrombocytopenia.Marland Kitchen -- I discussed my recommendations with the patient and she noted that she was agreeable to continue with Eliquis therapy at this time. --RTC on an As-needed basis.   # Neutropenia/Thrombocytopenia  --Likely etiology based on her prior blood work is neutropenia/thrombocytopenia secondary to cirrhosis. --continue to follow with [facility] for cirrhosis.  --RTC if there are any concerning findings in the above  workup  No orders of the defined types were placed in this encounter.   All questions were answered. The patient knows to call the clinic with any problems, questions or concerns.  A total of more than 30 minutes were spent on this encounter with face-to-face time and non-face-to-face time, including preparing to see the patient, ordering tests and/or medications, counseling the patient and coordination of care as outlined above.   Ledell Peoples, MD Department of Hematology/Oncology [facility] Phone: [phone] Pager: [phone] Email: [email or web address]  08/02/2022 5:19 PM

## 2022-08-09 NOTE — Progress Notes (Signed)
Carelink Summary Report / Loop Recorder 

## 2022-08-30 ENCOUNTER — Other Ambulatory Visit: Payer: Self-pay

## 2022-09-03 ENCOUNTER — Ambulatory Visit: Payer: Medicare PPO | Admitting: Neurology

## 2022-09-03 ENCOUNTER — Encounter: Payer: Self-pay | Admitting: Neurology

## 2022-09-03 ENCOUNTER — Ambulatory Visit (INDEPENDENT_AMBULATORY_CARE_PROVIDER_SITE_OTHER): Payer: Medicare PPO

## 2022-09-03 VITALS — BP 130/85 | HR 69 | Ht 64.0 in | Wt 218.0 lb

## 2022-09-03 DIAGNOSIS — R269 Unspecified abnormalities of gait and mobility: Secondary | ICD-10-CM | POA: Diagnosis not present

## 2022-09-03 DIAGNOSIS — M792 Neuralgia and neuritis, unspecified: Secondary | ICD-10-CM | POA: Diagnosis not present

## 2022-09-03 DIAGNOSIS — I480 Paroxysmal atrial fibrillation: Secondary | ICD-10-CM | POA: Diagnosis not present

## 2022-09-03 LAB — CUP PACEART REMOTE DEVICE CHECK
Date Time Interrogation Session: 20240426230115
Implantable Pulse Generator Implant Date: 20231211

## 2022-09-03 MED ORDER — GABAPENTIN 100 MG PO CAPS: 100.0000 mg | ORAL_CAPSULE | Freq: Three times a day (TID) | ORAL | 3 refills | Status: DC

## 2022-09-03 NOTE — Patient Instructions (Signed)
I had a long discussion with the patient regarding her gait and balance difficulties and neuropathic pain which is likely a combination of diabetic neuropathy as well as  her recent strokes and answered questions.  I recommended trial of gabapentin 100 mg twice daily for 1 week to be increased to 3 times daily as tolerated to help with neuropathic pain.  I advised her to get up slowly and avoid quick and sudden movements.  Continue Eliquis for stroke prevention for A-fib and maintain aggressive risk factor modification with strict control of hypertension with blood pressure goal below 130/90, lipids with LDL cholesterol goal below 70 mg percent and diabetes with insulin see goal below 6 point percent.  He was also advised to increase participation in regular activities for stress relaxation like meditation, yoga and.  Exercise.  She will return for follow-up in the future in 4 months.  Shanda Bumps nurse practitioner call earlier if necessary

## 2022-09-03 NOTE — Progress Notes (Signed)
[facility] [address] [place]. [place] [address] [phone]       HOSPITAL FOLLOW UP NOTE  [name] Date of Birth:  April 24, 1956 Medical Record Number:  [id number]   Reason for Referral:  hospital stroke follow up    SUBJECTIVE:   CHIEF COMPLAINT:  Chief Complaint  Patient presents with   Pain    RM 20 alone Pt is well, reports she is having pain in feet for over 20 years that has gradually worsen. She has trouble with keeping her balance due to pain.     HPI:   Prior visit 05/17/2022 Whitney Bumps, NP ) [name] is a 67 y.o. female with history of diabetes, cirrhosis, Sjogren's disease, OSA, remote rheumatic fever with AVR who presented to ED on 04/13/2022 with episode of imbalance, diplopia, nausea and numbness.  Stroke workup revealed scattered punctate infarcts of right cerebellum, midbrain, left occipital and right parietal of unclear etiology.  Loop recorder placed to evaluate for A-fib.  CTA head/neck bilateral fetal PCAs.  EF 60 to 65%.  LE Doppler negative for DVT.  LDL 97.  A1c 5.4.  Recommended DAPT for 3 weeks and aspirin alone as well as continuation of Zetia, not a statin candidate due to cirrhosis.  Therapy recommended home health PT.   Today, 05/17/2022, patient is being seen for initial hospital follow-up unaccompanied.  Reports doing well since discharge, will have occasional dizziness/unsteadiness sensation but has been gradually improving.  Worked with [facility] PT for balance which did help, has since completed.  Denies any recurrent diplopia or any other new stroke/TIA symptoms.  She does report chronic neuropathy which has been gradually progressive denies any worsening poststroke.  Loop recorder showed evidence of A-fib, DAPT discontinued and started on Eliquis 5 mg BID by cardiology with a CHA2DS2-VASc score of 6.  Reports doing well on Eliquis without any side effects.  Also reviewed on Zetia.  Blood pressure  well-controlled.  Update 09/03/2022 : She returns for follow-up after last visit with Whitney [name] nurse practitioner 3 months ago.  Patient has noticed that her gait and balance has been slightly off since her stroke.  Can still walk fine but she has trouble walking and feels he is flat-footed and has to walk slippers and shoes.  Has more trouble walking barefoot.  He does complain of tingling numbness and pain.  As well.  He has nocturnal leg cramps which are bothersome.  She does take magnesium 400 mg daily if it helps.  She is not taking Lexapro . she is looking after her son was on November 2023.  She has not tried neuropathic pain medications like gabapentin, Topamax and Lyrica.  He has not had any falls or injuries.  She has not needed a cane device to walk.  Had mild neuropathy symptoms for diagnosis of diabetes in 2001.  Sugars have been quite well-controlled of late.  She remains on Eliquis for stroke prevention and is tolerating it well with only minor bruising and bleeding.  Her blood pressure is under good control.  Last ER visit on 05/23/2022 when she was evaluated for transient episode of aphasia which resolved by the time she was seen.  CT head and MRI scan performed that day showed no acute abnormality.  Basic lab work was unremarkable.  Patient felt her symptoms were related to low blood sugar but her granddaughter insisted getting her evaluated.  She has done well since then without any new neurovascular complaints.  PERTINENT IMAGING  Per hospitalization 04/13/2022 - 04/16/2022 CT no acute abnormality CTA head and neck bilateral fetal PCAs MRI right cerebellum, midbrain, left occipital, right parietal punctate infarcts 2D Echo EF 60-65% LE venous doppler negative for DVT 10/2020 30-day heart monitoring showed short runs of PACs  LDL 97 HgbA1c 5.4    ROS:   14 system review of systems performed and negative with exception of those listed in HPI  PMH:  Past Medical History:   Diagnosis Date   Allergic rhinitis    Anemia    hx   Arthritis    Asthma    hx yrs ago   Cirrhosis (HCC) last albumin 3.3 done at [facility] 06-16-2014 (under care everywhere tab in epic)   Secondary to Fatty liver --  followed by hepatology at [facility] (dr Ardine Eng)    Depression    Diabetes mellitus type II    type 2 diet conrolled   Dyspnea    Fibromyalgia    GERD (gastroesophageal reflux disease)    Heart murmur    asymptomatic ---  1989 from rhuematic fever   History of exercise stress test    05-05-2013---  negative bruce ETT given exercise workload,  no ischemia   History of hiatal hernia    History of kidney stones    History of rheumatic fever    1989   Hyperlipidemia    Leukocytopenia    Moderate aortic stenosis    AVA area 1.1cm2---  cardiologist --  dr Lannie Fields, 2014 in epic   NASH (nonalcoholic steatohepatitis)    OSA (obstructive sleep apnea)    was using CPAP before gastric sleeve 2015--  no uses after wt loss   Pneumonia    hx   Sjogren's syndrome (HCC)    Thrombocytopenia (HCC)     PSH:  Past Surgical History:  Procedure Laterality Date   AORTIC VALVE REPLACEMENT N/A 12/05/2017   Procedure: AORTIC VALVE REPLACEMENT (AVR) TISSUE VALVE INSPIRIS;  Surgeon: Alleen Borne, MD;  Location: [facility] OR;  Service: Open Heart Surgery;  Laterality: N/A;   COLONOSCOPY WITH ESOPHAGOGASTRODUODENOSCOPY (EGD)     CYSTOSCOPY WITH RETROGRADE PYELOGRAM, URETEROSCOPY AND STENT PLACEMENT Left 01/21/2015   Procedure: CYSTOSCOPY WITH LEFT  RETROGRADE PYELOGRAM, LEFT URETEROSCOPY AND STENT PLACEMENT;  Surgeon: Jerilee Field, MD;  Location: [facility];  Service: Urology;  Laterality: Left;   CYSTOSCOPY WITH RETROGRADE PYELOGRAM, URETEROSCOPY AND STENT PLACEMENT Bilateral 02/24/2015   Procedure: CYSTOSCOPY WITH RIGHT RETROGRADE PYELOGRAM, BLADDER BIOPSY FULGERATION LEFT URETEROSCOPY AND STENT REPLACEMENT;  Surgeon: Jerilee Field, MD;  Location: [facility];  Service: Urology;  Laterality: Bilateral;   EXPLORATORY LAPAROSCOPY W/  CONE BIOPSY'S LEFT AND RIGHT LOBE OF LIVER  11-04-2007   HOLMIUM LASER APPLICATION Left 02/24/2015   Procedure: HOLMIUM LASER LITHOTRIPSY;  Surgeon: Jerilee Field, MD;  Location: [facility];  Service: Urology;  Laterality: Left;   HYSTEROSCOPY WITH D & C N/A 12/11/2012   Procedure: DILATATION AND CURETTAGE /HYSTEROSCOPY;  Surgeon: Dorien Chihuahua. Richardson Dopp, MD;  Location: [facility];  Service: Gynecology;  Laterality: N/A;   INGUINAL HERNIA REPAIR Left 10/22/2016   Procedure: LEFT INGUINAL HERNIA REPAIR;  Surgeon: Emelia Loron, MD;  Location: [facility] OR;  Service: General;  Laterality: Left;  TAP BLOCK   INSERTION OF MESH Left 10/22/2016   Procedure: INSERTION OF MESH;  Surgeon: Emelia Loron, MD;  Location: [facility] OR;  Service: General;  Laterality: Left;   LAPAROSCOPIC GASTRIC SLEEVE RESECTION  07-27-2013  LOOP RECORDER INSERTION N/A 04/16/2022   Procedure: LOOP RECORDER INSERTION;  Surgeon: [facility] Salvia, MD;  Location: [facility] INVASIVE CV LAB;  Service: Cardiovascular;  Laterality: N/A;   PUBOVAGINAL SLING  04-10-2001   SPARC   RIGHT/LEFT HEART CATH AND CORONARY ANGIOGRAPHY N/A 08/23/2017   Procedure: RIGHT/LEFT HEART CATH AND CORONARY ANGIOGRAPHY;  Surgeon: Corky Crafts, MD;  Location: [facility] INVASIVE CV LAB;  Service: Cardiovascular;  Laterality: N/A;   TEE WITHOUT CARDIOVERSION N/A 12/05/2017   Procedure: TRANSESOPHAGEAL ECHOCARDIOGRAM (TEE);  Surgeon: Alleen Borne, MD;  Location: [facility] OR;  Service: Open Heart Surgery;  Laterality: N/A;   TONSILLECTOMY  1975   TRANSTHORACIC ECHOCARDIOGRAM  06-04-2012  dr Eldridge Dace   mild LVH,  grade I diastolic dysfunction/  ef 60-65%/  moderate LAE/  mild MV calcifation without stenosis,  mild MR/  moderate AV stenosis,  cannot r/o bicupsid, area 1.1cm2/  mild dilated aortic root/  trivial TR    Social History:  Social History   Socioeconomic History   Marital status:  Widowed    Spouse name: Married to [employer]   Number of children: Not on file   Years of education: Not on file   Highest education level: Not on file  Occupational History   Occupation: principal of [employer]  Tobacco Use   Smoking status: Never   Smokeless tobacco: Never   Tobacco comments:    Never smoke 05/08/22  Vaping Use   Vaping Use: Never used  Substance and Sexual Activity   Alcohol use: Yes    Comment: rarely   Drug use: No   Sexual activity: Not on file  Other Topics Concern   Not on file  Social History Narrative   Not on file   Social Determinants of Health   Financial Resource Strain: Not on file  Food Insecurity: No Food Insecurity (07/02/2022)   Hunger Vital Sign    Worried About Running Out of Food in the Last Year: Never true    Ran Out of Food in the Last Year: Never true  Transportation Needs: No Transportation Needs (07/02/2022)   PRAPARE - Administrator, Civil Service (Medical): No    Lack of Transportation (Non-Medical): No  Physical Activity: Not on file  Stress: Not on file  Social Connections: Not on file  Intimate Partner Violence: Not At Risk (04/14/2022)   Humiliation, Afraid, Rape, and Kick questionnaire    Fear of Current or Ex-Partner: No    Emotionally Abused: No    Physically Abused: No    Sexually Abused: No    Family History:  Family History  Problem Relation Age of Onset   Alzheimer's disease Father    Hip fracture Father    Asthma Father    Heart disease Father    Heart attack Father    Hypertension Father    Rheum arthritis Mother    Heart disease Mother    Allergies Daughter    Asthma Paternal Grandmother    Asthma Daughter    Stroke Neg Hx     Medications:   Current Outpatient Medications on File Prior to Visit  Medication Sig Dispense Refill   albuterol (VENTOLIN HFA) 108 (90 Base) MCG/ACT inhaler INHALE 2 PUFFS INTO THE LUNGS EVERY 4 HOURS AS NEEDED FOR WHEEZING OR SHORTNESS OF BREATH. 8.5 each  5   amoxicillin (AMOXIL) 500 MG capsule Take 4 capsules 1 hour prior to any dental procedures 4 capsule 3   apixaban (ELIQUIS) 5 MG  TABS tablet Take 1 tablet (5 mg total) by mouth 2 (two) times daily. 90 tablet 3   ezetimibe (ZETIA) 10 MG tablet Take 10 mg by mouth at bedtime.   11   magnesium oxide (MAG-OX) 400 MG tablet Take 400 mg by mouth daily.     milk thistle 175 MG tablet Take 175 mg by mouth See admin instructions. Take 175 mg by mouth every 2-3 days     nadolol (CORGARD) 40 MG tablet Take 1 tablet (40 mg total) by mouth daily. 30 tablet 6   OZEMPIC, 0.25 OR 0.5 MG/DOSE, 2 MG/3ML SOPN Inject 2 mg into the skin once a week.     Tiotropium Bromide-Olodaterol (STIOLTO RESPIMAT) 2.5-2.5 MCG/ACT AERS Inhale 2 puffs into the lungs daily. INHALE 2 PUFFS BY MOUTH INTO THE LUNGS DAILY 3 each 3   Tiotropium Bromide-Olodaterol (STIOLTO RESPIMAT) 2.5-2.5 MCG/ACT AERS Place 2 each into the nose daily.     No current facility-administered medications on file prior to visit.    Allergies:   Allergies  Allergen Reactions   Doxycycline Hives and Rash   Naproxen Rash and Hives      OBJECTIVE:  Physical Exam  Vitals:   09/03/22 1051  BP: 130/85  Pulse: 69  Weight: 218 lb (98.9 kg)  Height: 5\' 4"  (1.626 m)   Body mass index is 37.42 kg/m. No results found.  General: Mildly obese middle-aged lady pleasant middle-age female, seated, in no evident distress Head: head normocephalic and atraumatic.   Neck: supple with no carotid or supraclavicular bruits Cardiovascular: regular rate and rhythm, no murmurs Musculoskeletal: no deformity Skin:  no rash/petichiae Vascular:  Normal pulses all extremities   Neurologic Exam Mental Status: Awake and fully alert.  Fluent speech and language.  Oriented to place and time. Recent and remote memory intact. Attention span, concentration and fund of knowledge appropriate. Mood and affect appropriate.  Cranial Nerves: Fundoscopic exam reveals sharp  disc margins. Pupils equal, briskly reactive to light. Extraocular movements full without nystagmus. Visual fields full to confrontation. Hearing intact. Facial sensation intact. Face, tongue, palate moves normally and symmetrically.  Motor: Normal bulk and tone. Normal strength in all tested extremity muscles Sensory.: intact to touch , pinprick , position and vibratory sensation except decreased sensation BLE distally (chronic).  Diminished vibration sensation from ankle down bilaterally. Coordination: Rapid alternating movements normal in all extremities. Finger-to-nose and heel-to-shin performed accurately bilaterally. Gait and Station: Arises from chair without difficulty. Stance is normal. Gait demonstrates wide-based gait with normal stride length and balance without use of AD.  Unable to do tandem walking. Reflexes: 1+ and symmetric except ankle jerks are depressed bilaterally.. Toes downgoing.     NIHSS  0 Modified Rankin  2      ASSESSMENT: Whitney [name] is a 67 y.o. year old female with scattered punctate infarcts at right cerebellum, midbrain, left occipital and right parietal on 04/13/2022 of unclear etiology s/p ILR. Vascular risk factors include DM, HLD, hx of arrhythmia with postop AVR A-fib but 30 day monitor no evidence of A-fib, advanced age and OSA.  Chronic complaints of lower extremity paresthesia and gait and balance difficulties likely from neuropathy pain from diabetic neuropathy    PLAN:  I had a long discussion with the patient regarding her gait and balance difficulties and neuropathic pain which is likely a combination of diabetic neuropathy as well as  her recent strokes and answered questions.  I recommended trial of gabapentin 100 mg twice daily for  1 week to be increased to 3 times daily as tolerated to help with neuropathic pain.  I advised her to get up slowly and avoid quick and sudden movements.  Continue Eliquis for stroke prevention for A-fib and maintain  aggressive risk factor modification with strict control of hypertension with blood pressure goal below 130/90, lipids with LDL cholesterol goal below 70 mg percent and diabetes with insulin see goal below 6 point percent.  She was also advised to increase participation in regular activities for stress relaxation like meditation, yoga and.  Exercise.  She will return for follow-up in the future in 4 months.  Jessica nurse practitioner call earlier if necessary Greater than 50% time during this 35-minute visit was spent in counseling and coordination of care about her neuropathic pain and gait and balance  Delia Heady, MD  [facility] [address] [place], [place] [address]  Phone [phone] Fax [phone] Note: This document was prepared with digital dictation and possible smart phrase technology. Any transcriptional errors that result from this process are unintentional.

## 2022-09-04 ENCOUNTER — Encounter: Payer: Self-pay | Admitting: Neurology

## 2022-09-11 NOTE — Progress Notes (Signed)
Carelink Summary Report / Loop Recorder 

## 2022-10-02 DIAGNOSIS — M6284 Sarcopenia: Secondary | ICD-10-CM | POA: Diagnosis not present

## 2022-10-02 DIAGNOSIS — R161 Splenomegaly, not elsewhere classified: Secondary | ICD-10-CM | POA: Diagnosis not present

## 2022-10-02 DIAGNOSIS — I8500 Esophageal varices without bleeding: Secondary | ICD-10-CM | POA: Diagnosis not present

## 2022-10-02 DIAGNOSIS — Z7901 Long term (current) use of anticoagulants: Secondary | ICD-10-CM | POA: Diagnosis not present

## 2022-10-02 DIAGNOSIS — K7460 Unspecified cirrhosis of liver: Secondary | ICD-10-CM | POA: Diagnosis not present

## 2022-10-02 DIAGNOSIS — I069 Rheumatic aortic valve disease, unspecified: Secondary | ICD-10-CM | POA: Diagnosis not present

## 2022-10-02 DIAGNOSIS — D72819 Decreased white blood cell count, unspecified: Secondary | ICD-10-CM | POA: Diagnosis not present

## 2022-10-02 DIAGNOSIS — K766 Portal hypertension: Secondary | ICD-10-CM | POA: Diagnosis not present

## 2022-10-02 DIAGNOSIS — D696 Thrombocytopenia, unspecified: Secondary | ICD-10-CM | POA: Diagnosis not present

## 2022-10-04 ENCOUNTER — Encounter: Payer: Self-pay | Admitting: Interventional Cardiology

## 2022-10-05 NOTE — Progress Notes (Signed)
Carelink Summary Report / Loop Recorder 

## 2022-10-08 ENCOUNTER — Ambulatory Visit (INDEPENDENT_AMBULATORY_CARE_PROVIDER_SITE_OTHER): Payer: Medicare PPO

## 2022-10-08 DIAGNOSIS — I480 Paroxysmal atrial fibrillation: Secondary | ICD-10-CM

## 2022-10-08 LAB — CUP PACEART REMOTE DEVICE CHECK
Date Time Interrogation Session: 20240602230204
Implantable Pulse Generator Implant Date: 20231211

## 2022-10-24 DIAGNOSIS — G4733 Obstructive sleep apnea (adult) (pediatric): Secondary | ICD-10-CM | POA: Diagnosis not present

## 2022-10-31 NOTE — Progress Notes (Signed)
Carelink Summary Report / Loop Recorder 

## 2022-11-01 ENCOUNTER — Encounter: Payer: Self-pay | Admitting: Interventional Cardiology

## 2022-11-01 ENCOUNTER — Telehealth: Payer: Self-pay | Admitting: Emergency Medicine

## 2022-11-01 DIAGNOSIS — Z20822 Contact with and (suspected) exposure to covid-19: Secondary | ICD-10-CM | POA: Diagnosis not present

## 2022-11-01 NOTE — Telephone Encounter (Signed)
Pt has been exposed to Covid from her daughter and with all her health issues she is worried about catching covid as well. Pt wants to know I she can come in to get a covid test pls advise

## 2022-11-06 NOTE — Telephone Encounter (Signed)
Per patient's chart, PCP sent in antiviral.   Called patient to check on her but she did not answer. Left message for her to call back.

## 2022-11-12 LAB — CUP PACEART REMOTE DEVICE CHECK
Date Time Interrogation Session: 20240705230145
Implantable Pulse Generator Implant Date: 20231211

## 2022-11-24 ENCOUNTER — Other Ambulatory Visit (HOSPITAL_COMMUNITY): Payer: Self-pay | Admitting: Physician Assistant

## 2022-12-03 DIAGNOSIS — Z79899 Other long term (current) drug therapy: Secondary | ICD-10-CM | POA: Diagnosis not present

## 2022-12-03 DIAGNOSIS — M3500 Sicca syndrome, unspecified: Secondary | ICD-10-CM | POA: Diagnosis not present

## 2022-12-17 ENCOUNTER — Ambulatory Visit (INDEPENDENT_AMBULATORY_CARE_PROVIDER_SITE_OTHER): Payer: Medicare PPO

## 2022-12-17 DIAGNOSIS — I480 Paroxysmal atrial fibrillation: Secondary | ICD-10-CM

## 2023-01-01 NOTE — Progress Notes (Signed)
Carelink Summary Report / Loop Recorder 

## 2023-01-10 DIAGNOSIS — D696 Thrombocytopenia, unspecified: Secondary | ICD-10-CM | POA: Diagnosis not present

## 2023-01-10 DIAGNOSIS — S61432A Puncture wound without foreign body of left hand, initial encounter: Secondary | ICD-10-CM | POA: Diagnosis not present

## 2023-01-10 DIAGNOSIS — E1142 Type 2 diabetes mellitus with diabetic polyneuropathy: Secondary | ICD-10-CM | POA: Diagnosis not present

## 2023-01-10 DIAGNOSIS — Z9884 Bariatric surgery status: Secondary | ICD-10-CM | POA: Diagnosis not present

## 2023-01-10 DIAGNOSIS — M25552 Pain in left hip: Secondary | ICD-10-CM | POA: Diagnosis not present

## 2023-01-10 DIAGNOSIS — Z6839 Body mass index (BMI) 39.0-39.9, adult: Secondary | ICD-10-CM | POA: Diagnosis not present

## 2023-01-10 DIAGNOSIS — E1121 Type 2 diabetes mellitus with diabetic nephropathy: Secondary | ICD-10-CM | POA: Diagnosis not present

## 2023-01-10 DIAGNOSIS — M3500 Sicca syndrome, unspecified: Secondary | ICD-10-CM | POA: Diagnosis not present

## 2023-01-10 DIAGNOSIS — M25551 Pain in right hip: Secondary | ICD-10-CM | POA: Diagnosis not present

## 2023-01-21 ENCOUNTER — Ambulatory Visit: Payer: Medicare PPO

## 2023-01-21 DIAGNOSIS — I480 Paroxysmal atrial fibrillation: Secondary | ICD-10-CM | POA: Diagnosis not present

## 2023-01-22 LAB — CUP PACEART REMOTE DEVICE CHECK
Date Time Interrogation Session: 20240913230009
Implantable Pulse Generator Implant Date: 20231211

## 2023-01-24 ENCOUNTER — Ambulatory Visit: Payer: Medicare PPO | Admitting: Emergency Medicine

## 2023-01-28 ENCOUNTER — Ambulatory Visit: Payer: Medicare PPO | Admitting: Orthopaedic Surgery

## 2023-01-28 ENCOUNTER — Encounter: Payer: Self-pay | Admitting: Orthopaedic Surgery

## 2023-01-28 ENCOUNTER — Telehealth: Payer: Self-pay | Admitting: Neurology

## 2023-01-28 ENCOUNTER — Other Ambulatory Visit (INDEPENDENT_AMBULATORY_CARE_PROVIDER_SITE_OTHER): Payer: Self-pay

## 2023-01-28 DIAGNOSIS — M7062 Trochanteric bursitis, left hip: Secondary | ICD-10-CM | POA: Diagnosis not present

## 2023-01-28 DIAGNOSIS — G8929 Other chronic pain: Secondary | ICD-10-CM

## 2023-01-28 DIAGNOSIS — M5442 Lumbago with sciatica, left side: Secondary | ICD-10-CM

## 2023-01-28 DIAGNOSIS — M545 Other chronic pain: Secondary | ICD-10-CM

## 2023-01-28 MED ORDER — LIDOCAINE HCL 1 % IJ SOLN
3.0000 mL | INTRAMUSCULAR | Status: AC | PRN
Start: 2023-01-28 — End: 2023-01-28
  Administered 2023-01-28: 3 mL

## 2023-01-28 MED ORDER — METHYLPREDNISOLONE ACETATE 40 MG/ML IJ SUSP
40.0000 mg | INTRAMUSCULAR | Status: AC | PRN
Start: 2023-01-28 — End: 2023-01-28
  Administered 2023-01-28: 40 mg via INTRA_ARTICULAR

## 2023-01-28 NOTE — Addendum Note (Signed)
Addended by: Mardene Celeste B on: 01/28/2023 03:55 PM   Modules accepted: Orders

## 2023-01-28 NOTE — Telephone Encounter (Signed)
Called the patient back. The patient states that her gait remains stable which is some related to post stroke but the neuropathy has definitely became more bothersome to her. At the last visit with Dr Pearlean Brownie he recommended she start Gabapentin. Pt has started that and been on this three times a day but has not felt it has offered relief. She describes that she also notes Fingertips feel like tingling sensation, the right index finger can at times have purplish color, not necessarily cool all the time. This has been going on too for about a year but everyone contributed it to her neuropathy. I was able to work the patient in on Thursday with out NP to discuss medication concern and recommendation.

## 2023-01-28 NOTE — Progress Notes (Signed)
The patient is a very pleasant 67 year old female that I am seeing for the first time who was sent to [place] from Dr. Tenny Craw to evaluate and treat left hip pain.  She points to the trochanteric area as a source of her pain but one of her other complaints is pain across the lower aspect of her lumbar spine.  She denies any sciatica.  There is no radicular involvements.  She does not walk with a cane but she does have a limp on occasion.  She says is painful to get up from a sitting position and laying on that side is painful.  She unfortunately does have some breathing issues as it relates to an issue with her hemidiaphragm.  She is also on Eliquis.  She said that after COVID she has gained decent amount of weight as well.  She denies any numbness and tingling or radicular pains going down into her legs or feet.  She does have certainly truncal obesity but she is very limber and mobile.  Her left hip and right hip moves smoothly and fluidly with no blocks to rotation and no pain in the groin at all.  Her pain does occur over the left side of her hip over the trochanteric area but she also has facet joint mediated pain of the lower aspect of her lumbar spine.  2 views of the lumbar spine do show the top of both femoral heads and her hips are well located with good space.  Her lumbar spine actually shows really good alignment as well.  I did offer her a steroid injection over the left hip trochanteric area which she agreed to and tolerated well.  She is someone who would definitely benefit from physical therapy that could help both her left hip and her lower lumbar spine with any modalities per the therapist discretion.  I will then see her back in follow-up after a course of physical therapy.  She agrees with this treatment plan as well.    Procedure Note  Patient: Whitney [name]             Date of Birth: 20-Nov-1955           MRN: [id number]             Visit Date: 01/28/2023  Procedures: Visit Diagnoses:   1. Chronic left-sided low back pain with left-sided sciatica   2. Trochanteric bursitis, left hip   3. Chronic bilateral low back pain without sciatica     Large Joint Inj: L greater trochanter on 01/28/2023 2:59 PM Indications: pain and diagnostic evaluation Details: 22 G 1.5 in needle, lateral approach  Arthrogram: No  Medications: 3 mL lidocaine 1 %; 40 mg methylPREDNISolone acetate 40 MG/ML Outcome: tolerated well, no immediate complications Procedure, treatment alternatives, risks and benefits explained, specific risks discussed. Consent was given by the patient. Immediately prior to procedure a time out was called to verify the correct patient, procedure, equipment, support staff and site/side marked as required. Patient was prepped and draped in the usual sterile fashion.

## 2023-01-28 NOTE — Telephone Encounter (Signed)
FYI- Pt requested f/u due to worsening neuropathy. Scheduled for first available of 08/20/23 and added to wait list.

## 2023-01-30 NOTE — Progress Notes (Unsigned)
[facility] [address] [name] [place] [address] [phone]       OFFICE FOLLOW UP NOTE  Whitney [name] Date of Birth:  Jun 02, 1955 Medical Record Number:  [id number]    Primary neurologist: Dr. Pearlean Brownie Reason for visit: Gait abnormality, neuropathic pain, hx of stroke    SUBJECTIVE:   CHIEF COMPLAINT:  No chief complaint on file.   HPI:   Update 01/31/2023 JM: Patient returns for sooner follow-up visit due to worsening neuropathy.  Use of gabapentin 100mg  TID without great benefit.        History provided for reference purposes only Update 09/03/2022 : She returns for follow-up after last visit with Shanda Bumps nurse practitioner 3 months ago.  Patient has noticed that her gait and balance has been slightly off since her stroke.  Can still walk fine but she has trouble walking and feels he is flat-footed and has to walk slippers and shoes.  Has more trouble walking barefoot.  He does complain of tingling numbness and pain.  As well.  He has nocturnal leg cramps which are bothersome.  She does take magnesium 400 mg daily if it helps.  She is not taking Lexapro . she is looking after her son was on November 2023.  She has not tried neuropathic pain medications like gabapentin, Topamax and Lyrica.  He has not had any falls or injuries.  She has not needed a cane device to walk.  Had mild neuropathy symptoms for diagnosis of diabetes in 2001.  Sugars have been quite well-controlled of late.  She remains on Eliquis for stroke prevention and is tolerating it well with only minor bruising and bleeding.  Her blood pressure is under good control.  Last ER visit on 05/23/2022 when she was evaluated for transient episode of aphasia which resolved by the time she was seen.  CT head and MRI scan performed that day showed no acute abnormality.  Basic lab work was unremarkable.  Patient felt her symptoms were related to low blood sugar but her granddaughter insisted getting her  evaluated.  She has done well since then without any new neurovascular complaints.     Initial visit 05/17/2022 JM: Patient is being seen for initial hospital follow-up unaccompanied.  Reports doing well since discharge, will have occasional dizziness/unsteadiness sensation but has been gradually improving.  Worked with [facility] PT for balance which did help, has since completed.  Denies any recurrent diplopia or any other new stroke/TIA symptoms.  She does report chronic neuropathy which has been gradually progressive denies any worsening poststroke. Loop recorder showed evidence of A-fib, DAPT discontinued and started on Eliquis 5 mg BID by cardiology with a CHA2DS2-VASc score of 6.  Reports doing well on Eliquis without any side effects.  Also reviewed on Zetia.  Blood pressure well-controlled.  Stroke admission 04/13/2022 Whitney [name] is a 67 y.o. female with history of diabetes, cirrhosis, Sjogren's disease, OSA, remote rheumatic fever with AVR who presented to ED on 04/13/2022 with episode of imbalance, diplopia, nausea and numbness.  Stroke workup revealed scattered punctate infarcts of right cerebellum, midbrain, left occipital and right parietal of unclear etiology.  Loop recorder placed to evaluate for A-fib.  CTA head/neck bilateral fetal PCAs.  EF 60 to 65%.  LE Doppler negative for DVT.  LDL 97.  A1c 5.4.  Recommended DAPT for 3 weeks and aspirin alone as well as continuation of Zetia, not a statin candidate due to cirrhosis.  Therapy recommended home health PT.  PERTINENT IMAGING  Per hospitalization 04/13/2022 - 04/16/2022 CT no acute abnormality CTA head and neck bilateral fetal PCAs MRI right cerebellum, midbrain, left occipital, right parietal punctate infarcts 2D Echo EF 60-65% LE venous doppler negative for DVT 10/2020 30-day heart monitoring showed short runs of PACs  LDL 97 HgbA1c 5.4    ROS:   14 system review of systems performed and negative with exception of those  listed in HPI  PMH:  Past Medical History:  Diagnosis Date   Allergic rhinitis    Anemia    hx   Arthritis    Asthma    hx yrs ago   Cirrhosis (HCC) last albumin 3.3 done at [facility] 06-16-2014 (under care everywhere tab in epic)   Secondary to Fatty liver --  followed by hepatology at [facility] (dr Ardine Eng)    Depression    Diabetes mellitus type II    type 2 diet conrolled   Dyspnea    Fibromyalgia    GERD (gastroesophageal reflux disease)    Heart murmur    asymptomatic ---  1989 from rhuematic fever   History of exercise stress test    05-05-2013---  negative bruce ETT given exercise workload,  no ischemia   History of hiatal hernia    History of kidney stones    History of rheumatic fever    1989   Hyperlipidemia    Leukocytopenia    Moderate aortic stenosis    AVA area 1.1cm2---  cardiologist --  dr Lannie Fields, 2014 in epic   NASH (nonalcoholic steatohepatitis)    OSA (obstructive sleep apnea)    was using CPAP before gastric sleeve 2015--  no uses after wt loss   Pneumonia    hx   Sjogren's syndrome (HCC)    Thrombocytopenia (HCC)     PSH:  Past Surgical History:  Procedure Laterality Date   AORTIC VALVE REPLACEMENT N/A 12/05/2017   Procedure: AORTIC VALVE REPLACEMENT (AVR) TISSUE VALVE INSPIRIS;  Surgeon: Alleen Borne, MD;  Location: [facility] OR;  Service: Open Heart Surgery;  Laterality: N/A;   COLONOSCOPY WITH ESOPHAGOGASTRODUODENOSCOPY (EGD)     CYSTOSCOPY WITH RETROGRADE PYELOGRAM, URETEROSCOPY AND STENT PLACEMENT Left 01/21/2015   Procedure: CYSTOSCOPY WITH LEFT  RETROGRADE PYELOGRAM, LEFT URETEROSCOPY AND STENT PLACEMENT;  Surgeon: Jerilee Field, MD;  Location: [facility];  Service: Urology;  Laterality: Left;   CYSTOSCOPY WITH RETROGRADE PYELOGRAM, URETEROSCOPY AND STENT PLACEMENT Bilateral 02/24/2015   Procedure: CYSTOSCOPY WITH RIGHT RETROGRADE PYELOGRAM, BLADDER BIOPSY FULGERATION LEFT URETEROSCOPY AND STENT REPLACEMENT;  Surgeon: Jerilee Field, MD;  Location[facility];  Service: Urology;  Laterality: Bilateral;   EXPLORATORY LAPAROSCOPY W/  CONE BIOPSY'S LEFT AND RIGHT LOBE OF LIVER  11-04-2007   HOLMIUM LASER APPLICATION Left 02/24/2015   Procedure: HOLMIUM LASER LITHOTRIPSY;  Surgeon: Jerilee Field, MD;  Location: [facility];  Service: Urology;  Laterality: Left;   HYSTEROSCOPY WITH D & C N/A 12/11/2012   Procedure: DILATATION AND CURETTAGE /HYSTEROSCOPY;  Surgeon: Dorien Chihuahua. Richardson Dopp, MD;  Location: [facility];  Service: Gynecology;  Laterality: N/A;   INGUINAL HERNIA REPAIR Left 10/22/2016   Procedure: LEFT INGUINAL HERNIA REPAIR;  Surgeon: Emelia Loron, MD;  [facility] OR;  Service: General;  Laterality: Left;  TAP BLOCK   INSERTION OF MESH Left 10/22/2016   Procedure: INSERTION OF MESH;  Surgeon: Emelia Loron, MD;  Location: [facility] OR;  Service: General;  Laterality: Left;   LAPAROSCOPIC GASTRIC SLEEVE RESECTION  07-27-2013  LOOP RECORDER INSERTION N/A 04/16/2022   Procedure: LOOP RECORDER INSERTION;  Surgeon: [facility] Salvia, MD;  Location: [facility] INVASIVE CV LAB;  Service: Cardiovascular;  Laterality: N/A;   PUBOVAGINAL SLING  04-10-2001   SPARC   RIGHT/LEFT HEART CATH AND CORONARY ANGIOGRAPHY N/A 08/23/2017   Procedure: RIGHT/LEFT HEART CATH AND CORONARY ANGIOGRAPHY;  Surgeon: Corky Crafts, MD;  Location: [facility] INVASIVE CV LAB;  Service: Cardiovascular;  Laterality: N/A;   TEE WITHOUT CARDIOVERSION N/A 12/05/2017   Procedure: TRANSESOPHAGEAL ECHOCARDIOGRAM (TEE);  Surgeon: Alleen Borne, MD;  Location: [facility] OR;  Service: Open Heart Surgery;  Laterality: N/A;   TONSILLECTOMY  1975   TRANSTHORACIC ECHOCARDIOGRAM  06-04-2012  dr Eldridge Dace   mild LVH,  grade I diastolic dysfunction/  ef 60-65%/  moderate LAE/  mild MV calcifation without stenosis,  mild MR/  moderate AV stenosis,  cannot r/o bicupsid, area 1.1cm2/  mild dilated aortic root/  trivial TR    Social History:  Social History    Socioeconomic History   Marital status: Widowed    Spouse name: Married to [employer]   Number of children: Not on file   Years of education: Not on file   Highest education level: Not on file  Occupational History   Occupation: principal of [employer]  Tobacco Use   Smoking status: Never   Smokeless tobacco: Never   Tobacco comments:    Never smoke 05/08/22  Vaping Use   Vaping status: Never Used  Substance and Sexual Activity   Alcohol use: Yes    Comment: rarely   Drug use: No   Sexual activity: Not on file  Other Topics Concern   Not on file  Social History Narrative   Not on file   Social Determinants of Health   Financial Resource Strain: Not on file  Food Insecurity: No Food Insecurity (07/02/2022)   Hunger Vital Sign    Worried About Running Out of Food in the Last Year: Never true    Ran Out of Food in the Last Year: Never true  Transportation Needs: No Transportation Needs (07/02/2022)   PRAPARE - Administrator, Civil Service (Medical): No    Lack of Transportation (Non-Medical): No  Physical Activity: Not on file  Stress: Not on file  Social Connections: Not on file  Intimate Partner Violence: Not At Risk (04/14/2022)   Humiliation, Afraid, Rape, and Kick questionnaire    Fear of Current or Ex-Partner: No    Emotionally Abused: No    Physically Abused: No    Sexually Abused: No    Family History:  Family History  Problem Relation Age of Onset   Alzheimer's disease Father    Hip fracture Father    Asthma Father    Heart disease Father    Heart attack Father    Hypertension Father    Rheum arthritis Mother    Heart disease Mother    Allergies Daughter    Asthma Paternal Grandmother    Asthma Daughter    Stroke Neg Hx     Medications:   Current Outpatient Medications on File Prior to Visit  Medication Sig Dispense Refill   albuterol (VENTOLIN HFA) 108 (90 Base) MCG/ACT inhaler INHALE 2 PUFFS INTO THE LUNGS EVERY 4 HOURS AS  NEEDED FOR WHEEZING OR SHORTNESS OF BREATH. 8.5 each 5   amoxicillin (AMOXIL) 500 MG capsule Take 4 capsules 1 hour prior to any dental procedures 4 capsule 3   apixaban (ELIQUIS) 5 MG  TABS tablet Take 1 tablet (5 mg total) by mouth 2 (two) times daily. 90 tablet 3   ezetimibe (ZETIA) 10 MG tablet Take 10 mg by mouth at bedtime.   11   gabapentin (NEURONTIN) 100 MG capsule Take 1 capsule (100 mg total) by mouth 3 (three) times daily. Start 1 capsule twice daily x 1 week and then three times daily 90 capsule 3   milk thistle 175 MG tablet Take 175 mg by mouth See admin instructions. Take 175 mg by mouth every 2-3 days     nadolol (CORGARD) 40 MG tablet TAKE 1 TABLET BY MOUTH EVERY DAY 90 tablet 2   OZEMPIC, 0.25 OR 0.5 MG/DOSE, 2 MG/3ML SOPN Inject 2 mg into the skin once a week.     Tiotropium Bromide-Olodaterol (STIOLTO RESPIMAT) 2.5-2.5 MCG/ACT AERS Inhale 2 puffs into the lungs daily. INHALE 2 PUFFS BY MOUTH INTO THE LUNGS DAILY 3 each 3   Tiotropium Bromide-Olodaterol (STIOLTO RESPIMAT) 2.5-2.5 MCG/ACT AERS Place 2 each into the nose daily.     No current facility-administered medications on file prior to visit.    Allergies:   Allergies  Allergen Reactions   Doxycycline Hives and Rash   Naproxen Rash and Hives      OBJECTIVE:  Physical Exam  There were no vitals filed for this visit.  There is no height or weight on file to calculate BMI. No results found.  General: well developed, well nourished, pleasant middle-age female, seated, in no evident distress Head: head normocephalic and atraumatic.   Neck: supple with no carotid or supraclavicular bruits Cardiovascular: regular rate and rhythm, no murmurs Musculoskeletal: no deformity Skin:  no rash/petichiae Vascular:  Normal pulses all extremities   Neurologic Exam Mental Status: Awake and fully alert.  Fluent speech and language.  Oriented to place and time. Recent and remote memory intact. Attention span, concentration  and fund of knowledge appropriate. Mood and affect appropriate.  Cranial Nerves: Fundoscopic exam reveals sharp disc margins. Pupils equal, briskly reactive to light. Extraocular movements full without nystagmus. Visual fields full to confrontation. Hearing intact. Facial sensation intact. Face, tongue, palate moves normally and symmetrically.  Motor: Normal bulk and tone. Normal strength in all tested extremity muscles Sensory.: intact to touch , pinprick , position and vibratory sensation except decreased sensation BLE distally (chronic) Coordination: Rapid alternating movements normal in all extremities. Finger-to-nose and heel-to-shin performed accurately bilaterally. Gait and Station: Arises from chair without difficulty. Stance is normal. Gait demonstrates wide-based gait with normal stride length and balance without use of AD.  Reflexes: 1+ and symmetric. Toes downgoing.        ASSESSMENT: Whitney [name] is a 67 y.o. year old female with scattered punctate infarcts at right cerebellum, midbrain, left occipital and right parietal on 04/13/2022 of unclear etiology s/p ILR. Vascular risk factors include DM, HLD, hx of arrhythmia with postop AVR A-fib but 30 day monitor no evidence of A-fib, advanced age and OSA.  Patient return to clinic in 08/2022 for complaints of chronic lower extremity paresthesias and gait/balance difficulties likely from diabetic neuropathy and prior stroke    PLAN:  Neuropathic pain: Increase gabapentin to 300 mg 3 times daily, discussed gradually increasing dose as tolerated   Cryptogenic stroke:  Residual deficit: Occasional dizziness/imbalance.  Encouraged HEP as advised by [facility] PT, declines additional PT currently, advised to call if interested in the future ILR showed evidence of A-fib 04/2022 now on Eliquis managed by cardiology Continue Eliquis 5 mg twice daily  and Zetia for secondary stroke prevention.   Discussed secondary stroke prevention measures and  importance of close PCP follow up for aggressive stroke risk factor management including BP goal<130/90, HLD with LDL goal<70 and DM with A1c.<7 .  I have gone over the pathophysiology of stroke, warning signs and symptoms, risk factors and their management in some detail with instructions to go to the closest emergency room for symptoms of concern.    Overall stable from stroke standpoint without further recommendations and risk factors are managed by PCP. She may follow up PRN, as usual for our patients who are strictly being followed for stroke. If any new neurological issues should arise, request PCP place referral for evaluation by one of our neurologists. Thank you.      CC:  [facility] provider: Dr. Pearlean Brownie PCP: Daisy Floro, MD    I spent 59 minutes of face-to-face and non-face-to-face time with patient.  This included previsit chart review including review of recent hospitalization, lab review, study review, order entry, electronic health record documentation, patient education regarding recent stroke including etiology, secondary stroke prevention measures and importance of managing stroke risk factors, residual deficits and typical recovery time and answered all other questions to patient satisfaction   Ihor Austin, AGNP-BC  [facility] [address] [place], [place] [address]  Phone [phone] Fax [phone] Note: This document was prepared with digital dictation and possible smart phrase technology. Any transcriptional errors that result from this process are unintentional.

## 2023-01-31 ENCOUNTER — Encounter: Payer: Self-pay | Admitting: Adult Health

## 2023-01-31 ENCOUNTER — Ambulatory Visit: Payer: Medicare PPO | Admitting: Adult Health

## 2023-01-31 VITALS — BP 145/84 | HR 68 | Ht 64.0 in | Wt 223.0 lb

## 2023-01-31 DIAGNOSIS — R269 Unspecified abnormalities of gait and mobility: Secondary | ICD-10-CM | POA: Diagnosis not present

## 2023-01-31 DIAGNOSIS — I639 Cerebral infarction, unspecified: Secondary | ICD-10-CM | POA: Diagnosis not present

## 2023-01-31 DIAGNOSIS — M792 Neuralgia and neuritis, unspecified: Secondary | ICD-10-CM

## 2023-01-31 MED ORDER — GABAPENTIN 100 MG PO CAPS
ORAL_CAPSULE | ORAL | 5 refills | Status: DC
Start: 2023-01-31 — End: 2023-09-12

## 2023-01-31 NOTE — Patient Instructions (Addendum)
Increase gabapentin bedtime dose to 300mg , continue 100mg  in the morning and afternoon  Please call after 2-3 weeks for possible need of dosage increase   Can consider doing physical therapy for neuropathy and balance if symptoms persist  Can consider repeat EMG/NCV if symptoms continue to progress  Speak with Dr. Tenny Craw regarding evaluation with vascular surgery to ensure good circulation to hands, this could very well be from neuropathy but could consider further evaluation with vascular surgery if needed      Follow up with Dr. Pearlean Brownie in April as scheduled

## 2023-02-06 NOTE — Progress Notes (Signed)
Carelink Summary Report / Loop Recorder 

## 2023-02-07 ENCOUNTER — Ambulatory Visit: Payer: Medicare PPO | Admitting: Physical Therapy

## 2023-02-08 ENCOUNTER — Encounter: Payer: Self-pay | Admitting: Physical Therapy

## 2023-02-08 ENCOUNTER — Ambulatory Visit: Payer: Medicare PPO | Admitting: Physical Therapy

## 2023-02-08 ENCOUNTER — Other Ambulatory Visit: Payer: Self-pay

## 2023-02-08 DIAGNOSIS — M6281 Muscle weakness (generalized): Secondary | ICD-10-CM

## 2023-02-08 DIAGNOSIS — M25552 Pain in left hip: Secondary | ICD-10-CM | POA: Diagnosis not present

## 2023-02-08 DIAGNOSIS — M5459 Other low back pain: Secondary | ICD-10-CM | POA: Diagnosis not present

## 2023-02-08 DIAGNOSIS — Z9181 History of falling: Secondary | ICD-10-CM

## 2023-02-08 NOTE — Therapy (Signed)
OUTPATIENT PHYSICAL THERAPY THORACOLUMBAR EVALUATION   Patient Name: Whitney [name] MRN: [id number] DOB:12/30/1955, 67 y.o., female Today's Date: 02/08/2023  END OF SESSION:  PT End of Session - 02/08/23 1200     Visit Number 1    Number of Visits 9    Date for PT Re-Evaluation 03/08/23    Authorization Type [employer] M[employer]    Authorization Time Period 02/08/23 to 03/08/23    Progress Note Due on Visit 10    PT Start Time 1115   pt late   PT Stop Time 1142    PT Time Calculation (min) 27 min    Activity Tolerance Patient tolerated treatment well    Behavior During Therapy [name] for tasks assessed/performed             Past Medical History:  Diagnosis Date   Allergic rhinitis    Anemia    hx   Arthritis    Asthma    hx yrs ago   Cirrhosis (HCC) last albumin 3.3 done at [facility] 06-16-2014 (under care everywhere tab in epic)   Secondary to Fatty liver --  followed by hepatology at [facility] (dr Ardine Eng)    Depression    Diabetes mellitus type II    type 2 diet conrolled   Dyspnea    Fibromyalgia    GERD (gastroesophageal reflux disease)    Heart murmur    asymptomatic ---  1989 from rhuematic fever   History of exercise stress test    05-05-2013---  negative bruce ETT given exercise workload,  no ischemia   History of hiatal hernia    History of kidney stones    History of rheumatic fever    1989   Hyperlipidemia    Leukocytopenia    Moderate aortic stenosis    AVA area 1.1cm2---  cardiologist --  dr Lannie Fields, 2014 in epic   NASH (nonalcoholic steatohepatitis)    OSA (obstructive sleep apnea)    was using CPAP before gastric sleeve 2015--  no uses after wt loss   Pneumonia    hx   Sjogren's syndrome (HCC)    Thrombocytopenia (HCC)    Past Surgical History:  Procedure Laterality Date   AORTIC VALVE REPLACEMENT N/A 12/05/2017   Procedure: AORTIC VALVE REPLACEMENT (AVR) TISSUE VALVE INSPIRIS;  Surgeon: Alleen Borne, MD;  Location: [facility] OR;  Service: Open Heart  Surgery;  Laterality: N/A;   COLONOSCOPY WITH ESOPHAGOGASTRODUODENOSCOPY (EGD)     CYSTOSCOPY WITH RETROGRADE PYELOGRAM, URETEROSCOPY AND STENT PLACEMENT Left 01/21/2015   Procedure: CYSTOSCOPY WITH LEFT  RETROGRADE PYELOGRAM, LEFT URETEROSCOPY AND STENT PLACEMENT;  Surgeon: Jerilee Field, MD;  Location: [facility];  Service: Urology;  Laterality: Left;   CYSTOSCOPY WITH RETROGRADE PYELOGRAM, URETEROSCOPY AND STENT PLACEMENT Bilateral 02/24/2015   Procedure: CYSTOSCOPY WITH RIGHT RETROGRADE PYELOGRAM, BLADDER BIOPSY FULGERATION LEFT URETEROSCOPY AND STENT REPLACEMENT;  Surgeon: Jerilee Field, MD;  Location: [facility];  Service: Urology;  Laterality: Bilateral;   EXPLORATORY LAPAROSCOPY W/  CONE BIOPSY'S LEFT AND RIGHT LOBE OF LIVER  11-04-2007   HOLMIUM LASER APPLICATION Left 02/24/2015   Procedure: HOLMIUM LASER LITHOTRIPSY;  Surgeon: Jerilee Field, MD;  Location: [facility];  Service: Urology;  Laterality: Left;   HYSTEROSCOPY WITH D & C N/A 12/11/2012   Procedure: DILATATION AND CURETTAGE /HYSTEROSCOPY;  Surgeon: Dorien Chihuahua. Richardson Dopp, MD;  Location: [facility];  Service: Gynecology;  Laterality: N/A;   INGUINAL HERNIA REPAIR Left 10/22/2016   Procedure: LEFT INGUINAL  HERNIA REPAIR;  Surgeon: Emelia Loron, MD;  Location: [facility] OR;  Service: General;  Laterality: Left;  TAP BLOCK   INSERTION OF MESH Left 10/22/2016   Procedure: INSERTION OF MESH;  Surgeon: Emelia Loron, MD;  Location: [facility] OR;  Service: General;  Laterality: Left;   LAPAROSCOPIC GASTRIC SLEEVE RESECTION  07-27-2013   LOOP RECORDER INSERTION N/A 04/16/2022   Procedure: LOOP RECORDER INSERTION;  Surgeon: [facility] Salvia, MD;  Location[facility] INVASIVE CV LAB;  Service: Cardiovascular;  Laterality: N/A;   PUBOVAGINAL SLING  04-10-2001   SPARC   RIGHT/LEFT HEART CATH AND CORONARY ANGIOGRAPHY N/A 08/23/2017   Procedure: RIGHT/LEFT HEART CATH AND CORONARY ANGIOGRAPHY;  Surgeon: Corky Crafts, MD;  Location: [facility] INVASIVE CV LAB;  Service: Cardiovascular;  Laterality: N/A;   TEE WITHOUT CARDIOVERSION N/A 12/05/2017   Procedure: TRANSESOPHAGEAL ECHOCARDIOGRAM (TEE);  Surgeon: Alleen Borne, MD;  Location: [facility] OR;  Service: Open Heart Surgery;  Laterality: N/A;   TONSILLECTOMY  1975   TRANSTHORACIC ECHOCARDIOGRAM  06-04-2012  dr Eldridge Dace   mild LVH,  grade I diastolic dysfunction/  ef 60-65%/  moderate LAE/  mild MV calcifation without stenosis,  mild MR/  moderate AV stenosis,  cannot r/o bicupsid, area 1.1cm2/  mild dilated aortic root/  trivial TR   Patient Active Problem List   Diagnosis Date Noted   Hypercoagulable state due to paroxysmal atrial fibrillation (HCC) 05/08/2022   Acute CVA (cerebrovascular accident) (HCC) 04/13/2022   COPD (chronic obstructive pulmonary disease) (HCC) 01/18/2021   Dyspnea 01/06/2019   Paroxysmal atrial fibrillation (HCC) 01/24/2018   S/P AVR 12/05/2017   Severe aortic stenosis    Inguinal hernia 10/22/2016   Lower extremity edema 06/22/2015   Aortic valve disorder 04/21/2013   Edema 04/21/2013   Depression    OSA (obstructive sleep apnea)    Sjogren's syndrome (HCC)    Cirrhosis (HCC)    Hyperlipidemia    Fibromyalgia    Postmenopausal bleeding 12/11/2012   Endometrial polyp 12/11/2012   Chronic cough 04/11/2011    PCP: Daisy Floro MD   REFERRING PROVIDER: Kathryne Hitch, MD  REFERRING DIAG: M70.62 (ICD-10-CM) - Trochanteric bursitis, left hip M54.50,G89.29 (ICD-10-CM) - Chronic bilateral low back pain without sciatica  Rationale for Evaluation and Treatment: Rehabilitation  THERAPY DIAG:  Other low back pain  Pain in left hip  Muscle weakness (generalized)  History of falling  ONSET DATE: MD referral 01/28/23  SUBJECTIVE:  SUBJECTIVE STATEMENT:   The steroid shot helped the hip for a couple days, it was a miracle and then it started hurting again. I have to lean over the sink to wash dishes because of my back but the doctor told me that my back looks great. 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 Had this pian in the past but it would go away. My hip feels like someone just came up and punched me, the back pain is always there. Its hard for me to get up and I have to think about how I'm doing things so I don't fall. Having trouble with steps, I want to crawl up them because I don't want to fall.   PERTINENT HISTORY:  OA, depression, DM, fibromyalgia, hernia, hx rheumatic fever, HLD, moderate aortic stenosis, sjogren's, aortic valve replacement  PAIN:  Are you having pain? Yes: NPRS scale: 4-5 for L hip, low back 2-3/10 Pain location: L hip and low back  Pain description: hip feels like someone punched me, back feels like a bad bruise that radiates into the bone  Aggravating factors: L hip climbing/any steps; back standing too long/doing rote things that take time Relieving factors: back- forward flexion/leaning on support; hip nothing   L hip 6-7/10 at worst, back at worst 810  PRECAUTIONS: None  RED FLAGS: None   WEIGHT BEARING RESTRICTIONS: No  FALLS:  Has patient fallen in last 6 months? Yes. Number of falls 1, tripped outdoors, severe FOF  LIVING ENVIRONMENT: Lives with: lives with their family Lives in: House/apartment Stairs:  1-2 STE  Has following equipment at home: Crutches  OCCUPATION: retired   PLOF: Independent, Independent with basic ADLs, Independent with gait, and Independent with transfers  PATIENT GOALS: less pain back and hip, no falls   NEXT MD VISIT: Magnus Ivan November 4th   OBJECTIVE:  Note: Objective measures were completed at Evaluation unless otherwise noted.  DIAGNOSTIC FINDINGS:  An AP and lateral lumbar spine  show no acute findings.  The disc heights  and spaces are well-maintained and the alignment is well-maintained.  PATIENT SURVEYS:  FOTO 2nd session    COGNITION: Overall cognitive status:  very tangential and easily distracted, may be baseline       SENSATION: Not tested    POSTURE: rounded shoulders, forward head, flexed trunk , and thoracic kyphosis, sits with R shoulder lower than L    LUMBAR ROM:   AROM eval  Flexion Mild limitation   Extension Mild limatation   Right lateral flexion Mild limitation   Left lateral flexion Mild limitation   Right rotation   Left rotation    (Blank rows = not tested)    LOWER EXTREMITY MMT:    MMT Right eval Left eval  Hip flexion 3+ 3+  Hip extension    Hip abduction 3+ seated  3+ seated   Hip adduction    Hip internal rotation    Hip external rotation    Knee flexion 4 4  Knee extension 4 4  Ankle dorsiflexion 5 5  Ankle plantarflexion    Ankle inversion    Ankle eversion     (Blank rows = not tested)  Significant core weakness noted     TODAY'S TREATMENT:  DATE:   Eval 02/08/23  Objective measures, education on POC     PATIENT EDUCATION:  Education details: POC  Person educated: Patient Education method: Explanation Education comprehension: verbalized understanding and needs further education  HOME EXERCISE PROGRAM: Next visit   ASSESSMENT:  CLINICAL IMPRESSION: Patient is a 67 y.o. F who was seen today for physical therapy evaluation and treatment for M70.62 (ICD-10-CM) - Trochanteric bursitis, left hip M54.50,G89.29 (ICD-10-CM) - Chronic bilateral low back pain without sciatica. Evaluation was extremely limited as she arrived late, and was very pleasant but very talkative/tangential during time remaining in our session time today. Objective measures as above. Will benefit from  trial of skilled PT services to attempt to address pain moving forward.   OBJECTIVE IMPAIRMENTS: Abnormal gait, decreased activity tolerance, decreased balance, difficulty walking, decreased ROM, decreased strength, decreased safety awareness, increased fascial restrictions, increased muscle spasms, obesity, and pain.   ACTIVITY LIMITATIONS: carrying, lifting, bending, sitting, standing, transfers, bed mobility, locomotion level, and caring for others  PARTICIPATION LIMITATIONS: meal prep, cleaning, laundry, shopping, community activity, and yard work  PERSONAL FACTORS: Age, Behavior pattern, Education, Fitness, Past/current experiences, Social background, and Time since onset of injury/illness/exacerbation are also affecting patient's functional outcome.   REHAB POTENTIAL: Fair chronicity of pain  CLINICAL DECISION MAKING: Stable/uncomplicated  EVALUATION COMPLEXITY: Low   GOALS: Goals reviewed with patient? Yes  SHORT TERM GOALS: Target date: STGs = LTGs    LONG TERM GOALS: Target date: 03/08/2023    Will be compliant with appropriate progressive HEP  Baseline:  Goal status: INITIAL  2.  Pain to be no more than 3/10 at worst in back and hips  Baseline:  Goal status: INITIAL  3.  MMT to have improved by at least 1 grade in all weak groups  Baseline:  Goal status: INITIAL  4.  Will be able to perform all functional household tasks and desired exercise based activities with no increase in pain  Baseline:  Goal status: INITIAL  5.  FOTO score to be within 5 points of predicted by time of DC  Baseline:  Goal status: INITIAL  6.  Will score at least 50/56 on Berg to show reduced fall risk  Baseline:  Goal status: INITIAL  PLAN:  PT FREQUENCY: 1-2x/week  PT DURATION: 4 weeks  PLANNED INTERVENTIONS: Therapeutic exercises, Therapeutic activity, Neuromuscular re-education, Gait training, Self Care, Manual therapy, and Re-evaluation.  PLAN FOR NEXT SESSION: needs HEP  and berg balance test; functional strength and balance work as tolerated including core work   Nedra Hai, PT, DPT 02/08/23 12:02 PM   Referring diagnosis? M70.62 (ICD-10-CM) - Trochanteric bursitis, left hip M54.50,G89.29 (ICD-10-CM) - Chronic bilateral low back pain without sciatica Treatment diagnosis? (if different than referring diagnosis) M54.59, M25.552, M62.81, Z91.81 What was this (referring dx) caused by? []  Surgery []  Fall [x]  Ongoing issue []  Arthritis []  Other: ____________  Laterality: []  Rt []  Lt [x]  Both  Check all possible CPT codes:  *CHOOSE 10 OR LESS*    []  97110 (Therapeutic Exercise)  []  92507 (SLP Treatment)  []  97112 (Neuro Re-ed)   []  92526 (Swallowing Treatment)   []  97116 (Gait Training)   []  [id number] (Cognitive Training, 1st 15 minutes) []  97140 (Manual Therapy)   []  97130 (Cognitive Training, each add'l 15 minutes)  []  97164 (Re-evaluation)                              []  Other, List  CPT Code ____________  []  97530 (Therapeutic Activities)     []  97535 (Self Care)   [x]  All codes above (97110 - 97535)  []  97012 (Mechanical Traction)  []  97014 (E-stim Unattended)  []  97032 (E-stim manual)  []  97033 (Ionto)  []  97035 (Ultrasound) []  97750 (Physical Performance Training) []  [id number] (Aquatic Therapy) []  97016 (Vasopneumatic Device) []  [id number] (Paraffin) []  97034 (Contrast Bath) []  97597 (Wound Care 1st 20 sq cm) []  97598 (Wound Care each add'l 20 sq cm) []  97760 (Orthotic Fabrication, Fitting, Training Initial) []  [id number] (Prosthetic Management and Training Initial) []  [id number] (Orthotic or Prosthetic Training/ Modification Subsequent)

## 2023-02-15 ENCOUNTER — Other Ambulatory Visit (HOSPITAL_COMMUNITY): Payer: Self-pay | Admitting: Physician Assistant

## 2023-02-15 NOTE — Telephone Encounter (Signed)
Prescription refill request for Eliquis received. Indication:afib Last office visit:3/24 Scr:0.7  5/24 Age: 67 Weight:101.2  kg  Prescription refilled

## 2023-02-21 ENCOUNTER — Ambulatory Visit: Payer: Medicare PPO | Admitting: Physical Therapy

## 2023-02-21 ENCOUNTER — Encounter: Payer: Self-pay | Admitting: Physical Therapy

## 2023-02-21 DIAGNOSIS — Z9181 History of falling: Secondary | ICD-10-CM | POA: Diagnosis not present

## 2023-02-21 DIAGNOSIS — M25552 Pain in left hip: Secondary | ICD-10-CM | POA: Diagnosis not present

## 2023-02-21 DIAGNOSIS — M6281 Muscle weakness (generalized): Secondary | ICD-10-CM

## 2023-02-21 DIAGNOSIS — M5459 Other low back pain: Secondary | ICD-10-CM

## 2023-02-21 NOTE — Therapy (Signed)
OUTPATIENT PHYSICAL THERAPY TREATMENT Patient Name: Whitney [name] MRN: [id number] DOB:1955-10-09, 67 y.o., female Today's Date: 02/21/2023  END OF SESSION:  PT End of Session - 02/21/23 1456     Visit Number 2    Number of Visits 9    Date for PT Re-Evaluation 03/08/23    Authorization Type [employer] MCR    Authorization Time Period 02/08/23 to 03/08/23    Progress Note Due on Visit 10    PT Start Time 1345    PT Stop Time 1427    PT Time Calculation (min) 42 min    Activity Tolerance Patient tolerated treatment well    Behavior During Therapy [facility] for tasks assessed/performed              Past Medical History:  Diagnosis Date   Allergic rhinitis    Anemia    hx   Arthritis    Asthma    hx yrs ago   Cirrhosis (HCC) last albumin 3.3 done at [facility] 06-16-2014 (under care everywhere tab in epic)   Secondary to Fatty liver --  followed by hepatology at [facility] (dr Ardine Eng)    Depression    Diabetes mellitus type II    type 2 diet conrolled   Dyspnea    Fibromyalgia    GERD (gastroesophageal reflux disease)    Heart murmur    asymptomatic ---  1989 from rhuematic fever   History of exercise stress test    05-05-2013---  negative bruce ETT given exercise workload,  no ischemia   History of hiatal hernia    History of kidney stones    History of rheumatic fever    1989   Hyperlipidemia    Leukocytopenia    Moderate aortic stenosis    AVA area 1.1cm2---  cardiologist --  dr Lannie Fields, 2014 in epic   NASH (nonalcoholic steatohepatitis)    OSA (obstructive sleep apnea)    was using CPAP before gastric sleeve 2015--  no uses after wt loss   Pneumonia    hx   Sjogren's syndrome (HCC)    Thrombocytopenia (HCC)    Past Surgical History:  Procedure Laterality Date   AORTIC VALVE REPLACEMENT N/A 12/05/2017   Procedure: AORTIC VALVE REPLACEMENT (AVR) TISSUE VALVE INSPIRIS;  Surgeon: Alleen Borne, MD;  Location: [facility] OR;  Service: Open Heart Surgery;  Laterality: N/A;    COLONOSCOPY WITH ESOPHAGOGASTRODUODENOSCOPY (EGD)     CYSTOSCOPY WITH RETROGRADE PYELOGRAM, URETEROSCOPY AND STENT PLACEMENT Left 01/21/2015   Procedure: CYSTOSCOPY WITH LEFT  RETROGRADE PYELOGRAM, LEFT URETEROSCOPY AND STENT PLACEMENT;  Surgeon: Jerilee Field, MD;  Location: [facility];  Service: Urology;  Laterality: Left;   CYSTOSCOPY WITH RETROGRADE PYELOGRAM, URETEROSCOPY AND STENT PLACEMENT Bilateral 02/24/2015   Procedure: CYSTOSCOPY WITH RIGHT RETROGRADE PYELOGRAM, BLADDER BIOPSY FULGERATION LEFT URETEROSCOPY AND STENT REPLACEMENT;  Surgeon: Jerilee Field, MD;  Location: [facility];  Service: Urology;  Laterality: Bilateral;   EXPLORATORY LAPAROSCOPY W/  CONE BIOPSY'S LEFT AND RIGHT LOBE OF LIVER  11-04-2007   HOLMIUM LASER APPLICATION Left 02/24/2015   Procedure: HOLMIUM LASER LITHOTRIPSY;  Surgeon: Jerilee Field, MD;  Location: [facility];  Service: Urology;  Laterality: Left;   HYSTEROSCOPY WITH D & C N/A 12/11/2012   Procedure: DILATATION AND CURETTAGE /HYSTEROSCOPY;  Surgeon: Dorien Chihuahua. Richardson Dopp, MD;  Location: [facility];  Service: Gynecology;  Laterality: N/A;   INGUINAL HERNIA REPAIR Left 10/22/2016   Procedure: LEFT INGUINAL HERNIA REPAIR;  Surgeon: Dwain Sarna,  Molli Hazard, MD;  Location: [facility] OR;  Service: General;  Laterality: Left;  TAP BLOCK   INSERTION OF MESH Left 10/22/2016   Procedure: INSERTION OF MESH;  Surgeon: Emelia Loron, MD;  Location: [facility] OR;  Service: General;  Laterality: Left;   LAPAROSCOPIC GASTRIC SLEEVE RESECTION  07-27-2013   LOOP RECORDER INSERTION N/A 04/16/2022   Procedure: LOOP RECORDER INSERTION;  Surgeon: [facility] Salvia, MD;  Location: [facility] INVASIVE CV LAB;  Service: Cardiovascular;  Laterality: N/A;   PUBOVAGINAL SLING  04-10-2001   SPARC   RIGHT/LEFT HEART CATH AND CORONARY ANGIOGRAPHY N/A 08/23/2017   Procedure: RIGHT/LEFT HEART CATH AND CORONARY ANGIOGRAPHY;  Surgeon: Corky Crafts, MD;  Location: [facility]  INVASIVE CV LAB;  Service: Cardiovascular;  Laterality: N/A;   TEE WITHOUT CARDIOVERSION N/A 12/05/2017   Procedure: TRANSESOPHAGEAL ECHOCARDIOGRAM (TEE);  Surgeon: Alleen Borne, MD;  Location: [facility] OR;  Service: Open Heart Surgery;  Laterality: N/A;   TONSILLECTOMY  1975   TRANSTHORACIC ECHOCARDIOGRAM  06-04-2012  dr Eldridge Dace   mild LVH,  grade I diastolic dysfunction/  ef 60-65%/  moderate LAE/  mild MV calcifation without stenosis,  mild MR/  moderate AV stenosis,  cannot r/o bicupsid, area 1.1cm2/  mild dilated aortic root/  trivial TR   Patient Active Problem List   Diagnosis Date Noted   Hypercoagulable state due to paroxysmal atrial fibrillation (HCC) 05/08/2022   Acute CVA (cerebrovascular accident) (HCC) 04/13/2022   COPD (chronic obstructive pulmonary disease) (HCC) 01/18/2021   Dyspnea 01/06/2019   Paroxysmal atrial fibrillation (HCC) 01/24/2018   S/P AVR 12/05/2017   Severe aortic stenosis    Inguinal hernia 10/22/2016   Lower extremity edema 06/22/2015   Aortic valve disorder 04/21/2013   Edema 04/21/2013   Depression    OSA (obstructive sleep apnea)    Sjogren's syndrome (HCC)    Cirrhosis (HCC)    Hyperlipidemia    Fibromyalgia    Postmenopausal bleeding 12/11/2012   Endometrial polyp 12/11/2012   Chronic cough 04/11/2011    PCP: Daisy Floro MD   REFERRING PROVIDER: Kathryne Hitch, MD  REFERRING DIAG: M70.62 (ICD-10-CM) - Trochanteric bursitis, left hip M54.50,G89.29 (ICD-10-CM) - Chronic bilateral low back pain without sciatica  Rationale for Evaluation and Treatment: Rehabilitation  THERAPY DIAG:  Other low back pain  Pain in left hip  Muscle weakness (generalized)  History of falling  ONSET DATE: MD referral 01/28/23  SUBJECTIVE:  SUBJECTIVE  STATEMENT:She says her back and hip are killing her today   PERTINENT HISTORY:  OA, depression, DM, fibromyalgia, hernia, hx rheumatic fever, HLD, moderate aortic stenosis, sjogren's, aortic valve replacement  PAIN:  Are you having pain? Yes: NPRS scale: 7/10 Pain location: L hip and low back  Pain description: hip feels like someone punched me, back feels like a bad bruise that radiates into the bone  Aggravating factors: L hip climbing/any steps; back standing too long/doing rote things that take time Relieving factors: back- forward flexion/leaning on support; hip nothing   L hip 6-7/10 at worst, back at worst 810  PRECAUTIONS: None  RED FLAGS: None   WEIGHT BEARING RESTRICTIONS: No  FALLS:  Has patient fallen in last 6 months? Yes. Number of falls 1, tripped outdoors, severe FOF  LIVING ENVIRONMENT: Lives with: lives with their family Lives in: House/apartment Stairs:  1-2 STE  Has following equipment at home: Crutches  OCCUPATION: retired   PLOF: Independent, Independent with basic ADLs, Independent with gait, and Independent with transfers  PATIENT GOALS: less pain back and hip, no falls   NEXT MD VISIT: Magnus Ivan November 4th   OBJECTIVE:  Note: Objective measures were completed at Evaluation unless otherwise noted.  DIAGNOSTIC FINDINGS:  An AP and lateral lumbar spine show no acute findings.  The disc heights  and spaces are well-maintained and the alignment is well-maintained.  PATIENT SURVEYS:  FOTO 2nd session    COGNITION: Overall cognitive status:  very tangential and easily distracted, may be baseline       SENSATION: Not tested    POSTURE: rounded shoulders, forward head, flexed trunk , and thoracic kyphosis, sits with R shoulder lower than L    LUMBAR ROM:   AROM eval  Flexion Mild limitation   Extension Mild limatation   Right lateral flexion Mild limitation   Left lateral flexion Mild limitation   Right rotation   Left rotation     (Blank rows = not tested)    LOWER EXTREMITY MMT:    MMT Right eval Left eval  Hip flexion 3+ 3+  Hip extension    Hip abduction 3+ seated  3+ seated   Hip adduction    Hip internal rotation    Hip external rotation    Knee flexion 4 4  Knee extension 4 4  Ankle dorsiflexion 5 5  Ankle plantarflexion    Ankle inversion    Ankle eversion     (Blank rows = not tested)  Significant core weakness noted    [facility] PT Assessment - 02/21/23 0001       Standardized Balance Assessment   Standardized Balance Assessment Berg Balance Test      Berg Balance Test   Sit to Stand Able to stand  independently using hands    Standing Unsupported Able to stand safely 2 minutes    Sitting with Back Unsupported but Feet Supported on Floor or Stool Able to sit safely and securely 2 minutes    Stand to Sit Controls descent by using hands    Transfers Able to transfer safely, definite need of hands    Standing Unsupported with Eyes Closed Able to stand 10 seconds with supervision    Standing Unsupported with Feet Together Able to place feet together independently and stand for 1 minute with supervision    From Standing, Reach Forward with Outstretched Arm Can reach confidently >25 cm (10")    From Standing Position, Pick up Object from Floor Able  to pick up shoe, needs supervision    From Standing Position, Turn to Look Behind Over each Shoulder Looks behind from both sides and weight shifts well    Turn 360 Degrees Able to turn 360 degrees safely but slowly    Standing Unsupported, Alternately Place Feet on Step/Stool Able to complete 4 steps without aid or supervision    Standing Unsupported, One Foot in Front Able to plae foot ahead of the other independently and hold 30 seconds    Standing on One Leg Tries to lift leg/unable to hold 3 seconds but remains standing independently    Total Score 42              TODAY'S TREATMENT:                                                                                                                               DATE:  02/21/23 Nu step L5 X 10 min UE/LE BERG balance test, see above for details Seated lumbar flexion stretch 5 sec X 10 ("no change in symptoms but feels like a good pull in my back") Standing lumbar extension stretch 5 sec X10 ("hurts some but a good hurt") Standing hip abd X 10 bilat with red band  Standing hip flexion SLR X 10 bilat with red band  Standing hip extension X 10 bilat with red band    Eval 02/08/23  Objective measures, education on POC     PATIENT EDUCATION:  Education details: POC  Person educated: Patient Education method: Explanation Education comprehension: verbalized understanding and needs further education  HOME EXERCISE PROGRAM: Access Code: [facility] URL: [email or web address] Date: 02/21/2023 Prepared by: Ivery Quale  Exercises - Seated Hamstring Stretch  - 2 x daily - 6 x weekly - 1 sets - 3 reps - 20 sec hold - Seated Piriformis Stretch with Trunk Bend  - 2 x daily - 6 x weekly - 1 sets - 3 reps - 20 sec hold - Standing Lumbar Extension  - 2 x daily - 6 x weekly - 1-2 sets - 10 reps - 5 sec hold - Standing Hip Abduction with Resistance at Ankles and Counter Support  - 2 x daily - 6 x weekly - 1-2 sets - 10 reps - Standing Hip Extension with Resistance at Ankles and Counter Support  - 2 x daily - 6 x weekly - 1-2 sets - 10 reps - Standing Hip Flexion with Resistance Loop  - 2 x daily - 6 x weekly - 1-2 sets - 10 reps - Standing Romberg to 3/4 Tandem Stance  - 2 x daily - 6 x weekly - 1 sets - 3 reps - 30 sec hold - Romberg Stance with Head Nods  - 2 x daily - 6 x weekly - 2 sets - 10 reps ASSESSMENT:  CLINICAL IMPRESSION: BERG balance test today revealed she is at a significant risk for falling. She did not have a direction preference  today for repeated flexion or extension stretching so I added both into her HEP as well as hip strengthening and balance exercises.  She shows good understanding of these today.  OBJECTIVE IMPAIRMENTS: Abnormal gait, decreased activity tolerance, decreased balance, difficulty walking, decreased ROM, decreased strength, decreased safety awareness, increased fascial restrictions, increased muscle spasms, obesity, and pain.   ACTIVITY LIMITATIONS: carrying, lifting, bending, sitting, standing, transfers, bed mobility, locomotion level, and caring for others  PARTICIPATION LIMITATIONS: meal prep, cleaning, laundry, shopping, community activity, and yard work  PERSONAL FACTORS: Age, Behavior pattern, Education, Fitness, Past/current experiences, Social background, and Time since onset of injury/illness/exacerbation are also affecting patient's functional outcome.   REHAB POTENTIAL: Fair chronicity of pain  CLINICAL DECISION MAKING: Stable/uncomplicated  EVALUATION COMPLEXITY: Low   GOALS: Goals reviewed with patient? Yes  SHORT TERM GOALS: Target date: STGs = LTGs    LONG TERM GOALS: Target date: 03/08/2023    Will be compliant with appropriate progressive HEP  Baseline:  Goal status: INITIAL  2.  Pain to be no more than 3/10 at worst in back and hips  Baseline:  Goal status: INITIAL  3.  MMT to have improved by at least 1 grade in all weak groups  Baseline:  Goal status: INITIAL  4.  Will be able to perform all functional household tasks and desired exercise based activities with no increase in pain  Baseline:  Goal status: INITIAL  5.  FOTO score to be within 5 points of predicted by time of DC  Baseline:  Goal status: INITIAL  6.  Will score at least 50/56 on Berg to show reduced fall risk  Baseline:  Goal status: INITIAL  PLAN:  PT FREQUENCY: 1-2x/week  PT DURATION: 4 weeks  PLANNED INTERVENTIONS: Therapeutic exercises, Therapeutic activity, Neuromuscular re-education, Gait training, Self Care, Manual therapy, and Re-evaluation.  PLAN FOR NEXT SESSION: functional strength and balance work as  tolerated including core work   Ivery Quale, PT, DPT 02/21/23 2:57 PM    Referring diagnosis? M70.62 (ICD-10-CM) - Trochanteric bursitis, left hip M54.50,G89.29 (ICD-10-CM) - Chronic bilateral low back pain without sciatica Treatment diagnosis? (if different than referring diagnosis) M54.59, M25.552, M62.81, Z91.81 What was this (referring dx) caused by? []  Surgery []  Fall [x]  Ongoing issue []  Arthritis []  Other: ____________  Laterality: []  Rt []  Lt [x]  Both  Check all possible CPT codes:  *CHOOSE 10 OR LESS*    []  97110 (Therapeutic Exercise)  []  92507 (SLP Treatment)  []  97112 (Neuro Re-ed)   []  92526 (Swallowing Treatment)   []  56213 (Gait Training)   []  [id number] (Cognitive Training, 1st 15 minutes) []  97140 (Manual Therapy)   []  97130 (Cognitive Training, each add'l 15 minutes)  []  97164 (Re-evaluation)                              []  Other, List CPT Code ____________  []  97530 (Therapeutic Activities)     []  97535 (Self Care)   [x]  All codes above (97110 - 97535)  []  97012 (Mechanical Traction)  []  97014 (E-stim Unattended)  []  97032 (E-stim manual)  []  97033 (Ionto)  []  97035 (Ultrasound) []  97750 (Physical Performance Training) []  [id number] (Aquatic Therapy) []  97016 (Vasopneumatic Device) []  [id number] (Paraffin) []  97034 (Contrast Bath) []  97597 (Wound Care 1st 20 sq cm) []  97598 (Wound Care each add'l 20 sq cm) []  97760 (Orthotic Fabrication, Fitting, Training Initial) []  [id number] (Prosthetic Management and Training Initial) []  [id number] (  Orthotic or Prosthetic Training/ Modification Subsequent)

## 2023-02-22 ENCOUNTER — Ambulatory Visit: Payer: Medicare PPO | Admitting: Physical Therapy

## 2023-02-22 ENCOUNTER — Encounter: Payer: Self-pay | Admitting: Physical Therapy

## 2023-02-22 DIAGNOSIS — M6281 Muscle weakness (generalized): Secondary | ICD-10-CM

## 2023-02-22 DIAGNOSIS — M5459 Other low back pain: Secondary | ICD-10-CM

## 2023-02-22 DIAGNOSIS — M25552 Pain in left hip: Secondary | ICD-10-CM

## 2023-02-22 DIAGNOSIS — Z9181 History of falling: Secondary | ICD-10-CM

## 2023-02-22 NOTE — Therapy (Signed)
OUTPATIENT PHYSICAL THERAPY TREATMENT Patient Name: Whitney [name] MRN: [id number] DOB:[birth year], 67 y.o., female Today's Date: 02/22/2023  END OF SESSION:  PT End of Session - 02/22/23 1109     Visit Number 3    Number of Visits 9    Date for PT Re-Evaluation 03/08/23    Authorization Type [employer] MCR    Authorization Time Period 02/08/23 to 03/08/23    Progress Note Due on Visit 10    PT Start Time 1104    PT Stop Time 1142    PT Time Calculation (min) 38 min    Activity Tolerance Patient tolerated treatment well    Behavior During Therapy [facility] for tasks assessed/performed               Past Medical History:  Diagnosis Date   Allergic rhinitis    Anemia    hx   Arthritis    Asthma    hx yrs ago   Cirrhosis (HCC) last albumin 3.3 done at [facility] 06-16-2014 (under care everywhere tab in epic)   Secondary to Fatty liver --  followed by hepatology at [facility] (dr Ardine Eng)    Depression    Diabetes mellitus type II    type 2 diet conrolled   Dyspnea    Fibromyalgia    GERD (gastroesophageal reflux disease)    Heart murmur    asymptomatic ---  1989 from rhuematic fever   History of exercise stress test    05-05-2013---  negative bruce ETT given exercise workload,  no ischemia   History of hiatal hernia    History of kidney stones    History of rheumatic fever    1989   Hyperlipidemia    Leukocytopenia    Moderate aortic stenosis    AVA area 1.1cm2---  cardiologist --  dr Lannie Fields, 2014 in epic   NASH (nonalcoholic steatohepatitis)    OSA (obstructive sleep apnea)    was using CPAP before gastric sleeve 2015--  no uses after wt loss   Pneumonia    hx   Sjogren's syndrome (HCC)    Thrombocytopenia (HCC)    Past Surgical History:  Procedure Laterality Date   AORTIC VALVE REPLACEMENT N/A 12/05/2017   Procedure: AORTIC VALVE REPLACEMENT (AVR) TISSUE VALVE INSPIRIS;  Surgeon: Alleen Borne, MD;  Location: [facility] OR;  Service: Open Heart Surgery;  Laterality: N/A;    COLONOSCOPY WITH ESOPHAGOGASTRODUODENOSCOPY (EGD)     CYSTOSCOPY WITH RETROGRADE PYELOGRAM, URETEROSCOPY AND STENT PLACEMENT Left 01/21/2015   Procedure: CYSTOSCOPY WITH LEFT  RETROGRADE PYELOGRAM, LEFT URETEROSCOPY AND STENT PLACEMENT;  Surgeon: Jerilee Field, MD;  Location: [facility];  Service: Urology;  Laterality: Left;   CYSTOSCOPY WITH RETROGRADE PYELOGRAM, URETEROSCOPY AND STENT PLACEMENT Bilateral 02/24/2015   Procedure: CYSTOSCOPY WITH RIGHT RETROGRADE PYELOGRAM, BLADDER BIOPSY FULGERATION LEFT URETEROSCOPY AND STENT REPLACEMENT;  Surgeon: Jerilee Field, MD;  Location: [facility];  Service: Urology;  Laterality: Bilateral;   EXPLORATORY LAPAROSCOPY W/  CONE BIOPSY'S LEFT AND RIGHT LOBE OF LIVER  11-04-2007   HOLMIUM LASER APPLICATION Left 02/24/2015   Procedure: HOLMIUM LASER LITHOTRIPSY;  Surgeon: Jerilee Field, MD;  Location: [facility];  Service: Urology;  Laterality: Left;   HYSTEROSCOPY WITH D & C N/A 12/11/2012   Procedure: DILATATION AND CURETTAGE /HYSTEROSCOPY;  Surgeon: Dorien Chihuahua. Richardson Dopp, MD;  Location: [facility];  Service: Gynecology;  Laterality: N/A;   INGUINAL HERNIA REPAIR Left 10/22/2016   Procedure: LEFT INGUINAL HERNIA REPAIR;  Surgeon:  Emelia Loron, MD;  Location: [facility] OR;  Service: General;  Laterality: Left;  TAP BLOCK   INSERTION OF MESH Left 10/22/2016   Procedure: INSERTION OF MESH;  Surgeon: Emelia Loron, MD;  Location: [facility] OR;  Service: General;  Laterality: Left;   LAPAROSCOPIC GASTRIC SLEEVE RESECTION  07-27-2013   LOOP RECORDER INSERTION N/A 04/16/2022   Procedure: LOOP RECORDER INSERTION;  Surgeon: [facility] Salvia, MD;  Location: [facility] INVASIVE CV LAB;  Service: Cardiovascular;  Laterality: N/A;   PUBOVAGINAL SLING  04-10-2001   SPARC   RIGHT/LEFT HEART CATH AND CORONARY ANGIOGRAPHY N/A 08/23/2017   Procedure: RIGHT/LEFT HEART CATH AND CORONARY ANGIOGRAPHY;  Surgeon: Corky Crafts, MD;  Location: [facility]  INVASIVE CV LAB;  Service: Cardiovascular;  Laterality: N/A;   TEE WITHOUT CARDIOVERSION N/A 12/05/2017   Procedure: TRANSESOPHAGEAL ECHOCARDIOGRAM (TEE);  Surgeon: Alleen Borne, MD;  Location: [facility] OR;  Service: Open Heart Surgery;  Laterality: N/A;   TONSILLECTOMY  1975   TRANSTHORACIC ECHOCARDIOGRAM  06-04-2012  dr Eldridge Dace   mild LVH,  grade I diastolic dysfunction/  ef 60-65%/  moderate LAE/  mild MV calcifation without stenosis,  mild MR/  moderate AV stenosis,  cannot r/o bicupsid, area 1.1cm2/  mild dilated aortic root/  trivial TR   Patient Active Problem List   Diagnosis Date Noted   Hypercoagulable state due to paroxysmal atrial fibrillation (HCC) 05/08/2022   Acute CVA (cerebrovascular accident) (HCC) 04/13/2022   COPD (chronic obstructive pulmonary disease) (HCC) 01/18/2021   Dyspnea 01/06/2019   Paroxysmal atrial fibrillation (HCC) 01/24/2018   S/P AVR 12/05/2017   Severe aortic stenosis    Inguinal hernia 10/22/2016   Lower extremity edema 06/22/2015   Aortic valve disorder 04/21/2013   Edema 04/21/2013   Depression    OSA (obstructive sleep apnea)    Sjogren's syndrome (HCC)    Cirrhosis (HCC)    Hyperlipidemia    Fibromyalgia    Postmenopausal bleeding 12/11/2012   Endometrial polyp 12/11/2012   Chronic cough 04/11/2011    PCP: Daisy Floro MD   REFERRING PROVIDER: Kathryne Hitch, MD  REFERRING DIAG: M70.62 (ICD-10-CM) - Trochanteric bursitis, left hip M54.50,G89.29 (ICD-10-CM) - Chronic bilateral low back pain without sciatica  Rationale for Evaluation and Treatment: Rehabilitation  THERAPY DIAG:  Other low back pain  Pain in left hip  Muscle weakness (generalized)  History of falling  ONSET DATE: MD referral 01/28/23  SUBJECTIVE:  SUBJECTIVE  STATEMENT: Felt a little better after last visit, but hurts more this morning  PERTINENT HISTORY:  OA, depression, DM, fibromyalgia, hernia, hx rheumatic fever, HLD, moderate aortic stenosis, sjogren's, aortic valve replacement  PAIN:  Are you having pain? Yes: NPRS scale: 5-6 in hip; 8 in back/10 Pain location: L hip and low back  Pain description: hip feels like someone punched me, back feels like a bad bruise that radiates into the bone  Aggravating factors: L hip climbing/any steps; back standing too long/doing rote things that take time Relieving factors: back- forward flexion/leaning on support; hip nothing   L hip 6-7/10 at worst, back at worst 8/10  PRECAUTIONS: None  RED FLAGS: None   WEIGHT BEARING RESTRICTIONS: No  FALLS:  Has patient fallen in last 6 months? Yes. Number of falls 1, tripped outdoors, severe FOF  LIVING ENVIRONMENT: Lives with: lives with their family Lives in: House/apartment Stairs:  1-2 STE  Has following equipment at home: Crutches  OCCUPATION: retired   PLOF: Independent, Independent with basic ADLs, Independent with gait, and Independent with transfers  PATIENT GOALS: less pain back and hip, no falls   NEXT MD VISIT: Magnus Ivan November 4th   OBJECTIVE:  Note: Objective measures were completed at Evaluation unless otherwise noted.  DIAGNOSTIC FINDINGS:  An AP and lateral lumbar spine show no acute findings.  The disc heights  and spaces are well-maintained and the alignment is well-maintained.  PATIENT SURVEYS:  02/22/23: FOTO 44 (predicted 47)   COGNITION: Overall cognitive status:  very tangential and easily distracted, may be baseline       SENSATION: Not tested   POSTURE: rounded shoulders, forward head, flexed trunk , and thoracic kyphosis, sits with R shoulder lower than L    LUMBAR ROM:   AROM eval  Flexion Mild limitation   Extension Mild limatation   Right lateral flexion Mild limitation   Left lateral flexion  Mild limitation   Right rotation   Left rotation    (Blank rows = not tested)    LOWER EXTREMITY MMT:    MMT Right eval Left eval  Hip flexion 3+ 3+  Hip extension    Hip abduction 3+ seated  3+ seated   Hip adduction    Hip internal rotation    Hip external rotation    Knee flexion 4 4  Knee extension 4 4  Ankle dorsiflexion 5 5  Ankle plantarflexion    Ankle inversion    Ankle eversion     (Blank rows = not tested)  Significant core weakness noted    TODAY'S TREATMENT:                                                                                                                              DATE:  02/22/23 TherEx NuStep L6 x 10 min Standing lumbar extension 10 reps x 10 sec hold Standing hip abd 2 X 10 bilat with red band  Standing hip flexion SLR 2 X 10 bilat with red band  Standing hip extension 2 X 10 bilat with red band  Supine single knee to chest 3x10 sec hold Sidelying Lt clamshell 2x10 with 3 sec hold Sidelying Lt hip circles x 10 reps each direction Seated lumbar flexion stretch 10 sec X 5    02/21/23 Nu step L5 X 10 min UE/LE BERG balance test, see above for details Seated lumbar flexion stretch 5 sec X 10 ("no change in symptoms but feels like a good pull in my back") Standing lumbar extension stretch 5 sec X10 ("hurts some but a good hurt") Standing hip abd X 10 bilat with red band  Standing hip flexion SLR X 10 bilat with red band  Standing hip extension X 10 bilat with red band    Eval 02/08/23 Objective measures, education on POC     PATIENT EDUCATION:  Education details: POC  Person educated: Patient Education method: Explanation Education comprehension: verbalized understanding and needs further education  HOME EXERCISE PROGRAM: Access Code: [facility] URL: [email or web address] Date: 02/21/2023 Prepared by: Ivery Quale  Exercises - Seated Hamstring Stretch  - 2 x daily - 6 x weekly - 1 sets - 3 reps - 20 sec  hold - Seated Piriformis Stretch with Trunk Bend  - 2 x daily - 6 x weekly - 1 sets - 3 reps - 20 sec hold - Standing Lumbar Extension  - 2 x daily - 6 x weekly - 1-2 sets - 10 reps - 5 sec hold - Standing Hip Abduction with Resistance at Ankles and Counter Support  - 2 x daily - 6 x weekly - 1-2 sets - 10 reps - Standing Hip Extension with Resistance at Ankles and Counter Support  - 2 x daily - 6 x weekly - 1-2 sets - 10 reps - Standing Hip Flexion with Resistance Loop  - 2 x daily - 6 x weekly - 1-2 sets - 10 reps - Standing Romberg to 3/4 Tandem Stance  - 2 x daily - 6 x weekly - 1 sets - 3 reps - 30 sec hold - Romberg Stance with Head Nods  - 2 x daily - 6 x weekly - 2 sets - 10 reps ASSESSMENT:  CLINICAL IMPRESSION: Session today focused on review of HEP and encouraged pt to continue with these at home.  Decreased pain reported at end of session.  Will continue to benefit from PT to maximize function.  OBJECTIVE IMPAIRMENTS: Abnormal gait, decreased activity tolerance, decreased balance, difficulty walking, decreased ROM, decreased strength, decreased safety awareness, increased fascial restrictions, increased muscle spasms, obesity, and pain.   ACTIVITY LIMITATIONS: carrying, lifting, bending, sitting, standing, transfers, bed mobility, locomotion level, and caring for others  PARTICIPATION LIMITATIONS: meal prep, cleaning, laundry, shopping, community activity, and yard work  PERSONAL FACTORS: Age, Behavior pattern, Education, Fitness, Past/current experiences, Social background, and Time since onset of injury/illness/exacerbation are also affecting patient's functional outcome.   REHAB POTENTIAL: Fair chronicity of pain  CLINICAL DECISION MAKING: Stable/uncomplicated  EVALUATION COMPLEXITY: Low   GOALS: Goals reviewed with patient? Yes  SHORT TERM GOALS: Target date: STGs = LTGs    LONG TERM GOALS: Target date: 03/08/2023    Will be compliant with appropriate progressive  HEP  Baseline:  Goal status: INITIAL  2.  Pain to be no more than 3/10 at worst in back and hips  Baseline:  Goal status: INITIAL  3.  MMT to have improved by at least 1 grade in  all weak groups  Baseline:  Goal status: INITIAL  4.  Will be able to perform all functional household tasks and desired exercise based activities with no increase in pain  Baseline:  Goal status: INITIAL  5.  FOTO score to be within 5 points of predicted by time of DC  Baseline:  Goal status: INITIAL  6.  Will score at least 50/56 on Berg to show reduced fall risk  Baseline:  Goal status: INITIAL  PLAN:  PT FREQUENCY: 1-2x/week  PT DURATION: 4 weeks  PLANNED INTERVENTIONS: Therapeutic exercises, Therapeutic activity, Neuromuscular re-education, Gait training, Self Care, Manual therapy, and Re-evaluation.  PLAN FOR NEXT SESSION: review/update HEP PRN,  functional strength and balance work as tolerated including core work   Clarita Crane, PT, DPT 02/22/23 11:46 AM     Referring diagnosis? M70.62 (ICD-10-CM) - Trochanteric bursitis, left hip M54.50,G89.29 (ICD-10-CM) - Chronic bilateral low back pain without sciatica Treatment diagnosis? (if different than referring diagnosis) M54.59, M25.552, M62.81, Z91.81 What was this (referring dx) caused by? []  Surgery []  Fall [x]  Ongoing issue []  Arthritis []  Other: ____________  Laterality: []  Rt []  Lt [x]  Both  Check all possible CPT codes:  *CHOOSE 10 OR LESS*    []  97110 (Therapeutic Exercise)  []  92507 (SLP Treatment)  []  13086 (Neuro Re-ed)   []  92526 (Swallowing Treatment)   []  57846 (Gait Training)   []  [id number] (Cognitive Training, 1st 15 minutes) []  97140 (Manual Therapy)   []  97130 (Cognitive Training, each add'l 15 minutes)  []  97164 (Re-evaluation)                              []  Other, List CPT Code ____________  []  97530 (Therapeutic Activities)     []  97535 (Self Care)   [x]  All codes above (97110 - 97535)  []  97012  (Mechanical Traction)  []  97014 (E-stim Unattended)  []  97032 (E-stim manual)  []  97033 (Ionto)  []  97035 (Ultrasound) []  97750 (Physical Performance Training) []  [id number] (Aquatic Therapy) []  97016 (Vasopneumatic Device) []  [id number] (Paraffin) []  97034 (Contrast Bath) []  97597 (Wound Care 1st 20 sq cm) []  97598 (Wound Care each add'l 20 sq cm) []  97760 (Orthotic Fabrication, Fitting, Training Initial) []  [id number] (Prosthetic Management and Training Initial) []  [id number] (Orthotic or Prosthetic Training/ Modification Subsequent)

## 2023-02-25 ENCOUNTER — Ambulatory Visit: Payer: Medicare PPO | Admitting: Physical Therapy

## 2023-02-25 ENCOUNTER — Encounter: Payer: Self-pay | Admitting: Physical Therapy

## 2023-02-25 ENCOUNTER — Ambulatory Visit: Payer: Medicare PPO

## 2023-02-25 DIAGNOSIS — G4733 Obstructive sleep apnea (adult) (pediatric): Secondary | ICD-10-CM | POA: Diagnosis not present

## 2023-02-25 DIAGNOSIS — I480 Paroxysmal atrial fibrillation: Secondary | ICD-10-CM

## 2023-02-25 DIAGNOSIS — M25552 Pain in left hip: Secondary | ICD-10-CM | POA: Diagnosis not present

## 2023-02-25 DIAGNOSIS — M6281 Muscle weakness (generalized): Secondary | ICD-10-CM | POA: Diagnosis not present

## 2023-02-25 DIAGNOSIS — Z9181 History of falling: Secondary | ICD-10-CM | POA: Diagnosis not present

## 2023-02-25 DIAGNOSIS — M5459 Other low back pain: Secondary | ICD-10-CM

## 2023-02-25 LAB — CUP PACEART REMOTE DEVICE CHECK
Date Time Interrogation Session: 20241020231632
Implantable Pulse Generator Implant Date: 20231211

## 2023-02-25 NOTE — Therapy (Signed)
OUTPATIENT PHYSICAL THERAPY TREATMENT Patient Name: Whitney [name] MRN: [id number] DOB:02/24/1956, 67 y.o., female Today's Date: 02/25/2023  END OF SESSION:  PT End of Session - 02/25/23 1415     Visit Number 4    Number of Visits 9    Date for PT Re-Evaluation 03/08/23    Authorization Type [employer] MCR    Progress Note Due on Visit 10    PT Start Time 1358    PT Stop Time 1430    PT Time Calculation (min) 32 min    Activity Tolerance Patient tolerated treatment well    Behavior During Therapy WFL for tasks assessed/performed                Past Medical History:  Diagnosis Date   Allergic rhinitis    Anemia    hx   Arthritis    Asthma    hx yrs ago   Cirrhosis (HCC) last albumin 3.3 done at [facility] 06-16-2014 (under care everywhere tab in epic)   Secondary to Fatty liver --  followed by hepatology at [facility] (dr Ardine Eng)    Depression    Diabetes mellitus type II    type 2 diet conrolled   Dyspnea    Fibromyalgia    GERD (gastroesophageal reflux disease)    Heart murmur    asymptomatic ---  1989 from rhuematic fever   History of exercise stress test    05-05-2013---  negative bruce ETT given exercise workload,  no ischemia   History of hiatal hernia    History of kidney stones    History of rheumatic fever    1989   Hyperlipidemia    Leukocytopenia    Moderate aortic stenosis    AVA area 1.1cm2---  cardiologist --  dr Lannie Fields, 2014 in epic   NASH (nonalcoholic steatohepatitis)    OSA (obstructive sleep apnea)    was using CPAP before gastric sleeve 2015--  no uses after wt loss   Pneumonia    hx   Sjogren's syndrome (HCC)    Thrombocytopenia (HCC)    Past Surgical History:  Procedure Laterality Date   AORTIC VALVE REPLACEMENT N/A 12/05/2017   Procedure: AORTIC VALVE REPLACEMENT (AVR) TISSUE VALVE INSPIRIS;  Surgeon: Alleen Borne, MD;  Location: [facility];  Service: Open Heart Surgery;  Laterality: N/A;   COLONOSCOPY WITH ESOPHAGOGASTRODUODENOSCOPY  (EGD)     CYSTOSCOPY WITH RETROGRADE PYELOGRAM, URETEROSCOPY AND STENT PLACEMENT Left 01/21/2015   Procedure: CYSTOSCOPY WITH LEFT  RETROGRADE PYELOGRAM, LEFT URETEROSCOPY AND STENT PLACEMENT;  Surgeon: Jerilee Field, MD;  Location: [facility];  Service: Urology;  Laterality: Left;   CYSTOSCOPY WITH RETROGRADE PYELOGRAM, URETEROSCOPY AND STENT PLACEMENT Bilateral 02/24/2015   Procedure: CYSTOSCOPY WITH RIGHT RETROGRADE PYELOGRAM, BLADDER BIOPSY FULGERATION LEFT URETEROSCOPY AND STENT REPLACEMENT;  Surgeon: Jerilee Field, MD;  Location: [facility];  Service: Urology;  Laterality: Bilateral;   EXPLORATORY LAPAROSCOPY W/  CONE BIOPSY'S LEFT AND RIGHT LOBE OF LIVER  11-04-2007   HOLMIUM LASER APPLICATION Left 02/24/2015   Procedure: HOLMIUM LASER LITHOTRIPSY;  Surgeon: Jerilee Field, MD;  Location: [facility];  Service: Urology;  Laterality: Left;   HYSTEROSCOPY WITH D & C N/A 12/11/2012   Procedure: DILATATION AND CURETTAGE /HYSTEROSCOPY;  Surgeon: Dorien Chihuahua. Richardson Dopp, MD;  Location: [facility];  Service: Gynecology;  Laterality: N/A;   INGUINAL HERNIA REPAIR Left 10/22/2016   Procedure: LEFT INGUINAL HERNIA REPAIR;  Surgeon: Emelia Loron, MD;  Location: [facility];  Service: General;  Laterality: Left;  TAP BLOCK   INSERTION OF MESH Left 10/22/2016   Procedure: INSERTION OF MESH;  Surgeon: Emelia Loron, MD;  Location: [facility] OR;  Service: General;  Laterality: Left;   LAPAROSCOPIC GASTRIC SLEEVE RESECTION  07-27-2013   LOOP RECORDER INSERTION N/A 04/16/2022   Procedure: LOOP RECORDER INSERTION;  Surgeon: [facility] Salvia, MD;  Location: [facility] INVASIVE CV LAB;  Service: Cardiovascular;  Laterality: N/A;   PUBOVAGINAL SLING  04-10-2001   SPARC   RIGHT/LEFT HEART CATH AND CORONARY ANGIOGRAPHY N/A 08/23/2017   Procedure: RIGHT/LEFT HEART CATH AND CORONARY ANGIOGRAPHY;  Surgeon: Corky Crafts, MD;  Location: [facility] INVASIVE CV LAB;  Service: Cardiovascular;   Laterality: N/A;   TEE WITHOUT CARDIOVERSION N/A 12/05/2017   Procedure: TRANSESOPHAGEAL ECHOCARDIOGRAM (TEE);  Surgeon: Alleen Borne, MD;  Location: [facility] OR;  Service: Open Heart Surgery;  Laterality: N/A;   TONSILLECTOMY  1975   TRANSTHORACIC ECHOCARDIOGRAM  06-04-2012  dr Eldridge Dace   mild LVH,  grade I diastolic dysfunction/  ef 60-65%/  moderate LAE/  mild MV calcifation without stenosis,  mild MR/  moderate AV stenosis,  cannot r/o bicupsid, area 1.1cm2/  mild dilated aortic root/  trivial TR   Patient Active Problem List   Diagnosis Date Noted   Hypercoagulable state due to paroxysmal atrial fibrillation (HCC) 05/08/2022   Acute CVA (cerebrovascular accident) (HCC) 04/13/2022   COPD (chronic obstructive pulmonary disease) (HCC) 01/18/2021   Dyspnea 01/06/2019   Paroxysmal atrial fibrillation (HCC) 01/24/2018   S/P AVR 12/05/2017   Severe aortic stenosis    Inguinal hernia 10/22/2016   Lower extremity edema 06/22/2015   Aortic valve disorder 04/21/2013   Edema 04/21/2013   Depression    OSA (obstructive sleep apnea)    Sjogren's syndrome (HCC)    Cirrhosis (HCC)    Hyperlipidemia    Fibromyalgia    Postmenopausal bleeding 12/11/2012   Endometrial polyp 12/11/2012   Chronic cough 04/11/2011    PCP: Daisy Floro MD   REFERRING PROVIDER: Kathryne Hitch, MD  REFERRING DIAG: M70.62 (ICD-10-CM) - Trochanteric bursitis, left hip M54.50,G89.29 (ICD-10-CM) - Chronic bilateral low back pain without sciatica  Rationale for Evaluation and Treatment: Rehabilitation  THERAPY DIAG:  Other low back pain  Pain in left hip  Muscle weakness (generalized)  History of falling  ONSET DATE: MD referral 01/28/23  SUBJECTIVE:                                                                                                                                                                                           SUBJECTIVE STATEMENT: Pt stating she has  not been feeling well  today. Pt stating her pain is 4/10 today. Pt stating, "I almost didn't come today".   PERTINENT HISTORY:  OA, depression, DM, fibromyalgia, hernia, hx rheumatic fever, HLD, moderate aortic stenosis, sjogren's, aortic valve replacement  PAIN:  Are you having pain? Yes: NPRS scale: 4/10 pain in her low back /10 Pain location: L hip and low back  Pain description: hip feels like someone punched me, back feels like a bad bruise that radiates into the bone  Aggravating factors: L hip climbing/any steps; back standing too long/doing rote things that take time Relieving factors: back- forward flexion/leaning on support; hip nothing   L hip 6-7/10 at worst, back at worst 8/10  PRECAUTIONS: None  RED FLAGS: None   WEIGHT BEARING RESTRICTIONS: No  FALLS:  Has patient fallen in last 6 months? Yes. Number of falls 1, tripped outdoors, severe FOF  LIVING ENVIRONMENT: Lives with: lives with their family Lives in: House/apartment Stairs:  1-2 STE  Has following equipment at home: Crutches  OCCUPATION: retired   PLOF: Independent, Independent with basic ADLs, Independent with gait, and Independent with transfers  PATIENT GOALS: less pain back and hip, no falls   NEXT MD VISIT: Magnus Ivan November 4th   OBJECTIVE:  Note: Objective measures were completed at Evaluation unless otherwise noted.  DIAGNOSTIC FINDINGS:  An AP and lateral lumbar spine show no acute findings.  The disc heights  and spaces are well-maintained and the alignment is well-maintained.  PATIENT SURVEYS:  02/22/23: FOTO 44 (predicted 47)   COGNITION: Overall cognitive status:  very tangential and easily distracted, may be baseline       SENSATION: Not tested   POSTURE: rounded shoulders, forward head, flexed trunk , and thoracic kyphosis, sits with R shoulder lower than L    LUMBAR ROM:   AROM eval  Flexion Mild limitation   Extension Mild limatation   Right lateral flexion Mild limitation   Left  lateral flexion Mild limitation   Right rotation   Left rotation    (Blank rows = not tested)    LOWER EXTREMITY MMT:    MMT Right eval Left eval  Hip flexion 3+ 3+  Hip extension    Hip abduction 3+ seated  3+ seated   Hip adduction    Hip internal rotation    Hip external rotation    Knee flexion 4 4  Knee extension 4 4  Ankle dorsiflexion 5 5  Ankle plantarflexion    Ankle inversion    Ankle eversion     (Blank rows = not tested)  Significant core weakness noted    TODAY'S TREATMENT:                                                                                                                              DATE:  02/25/23 TherEx NuStep L5 x 6 minutes min, level 7 x 2 minutes Standing lumbar extension 10 reps x 10  sec hold Standing hip abd 2 X 10 bilat with red band  Standing hip extension 2 X 10 bilat with red band  Supine glute stretch: x 3 bil 20 sec hold Supine dead bug: x 10 bil Supine lumbar/thoracic rotation: 2 x 20 sec hold Side-lying hip abd circles x 10 both directions bil  Side-lying reverse clams with yoga block between knees x 10  - during mat activities, pt's LE was elevated with bolster and pillows to help pt's breathing.     02/22/23 TherEx NuStep L6 x 10 min Standing lumbar extension 10 reps x 10 sec hold Standing hip abd 2 X 10 bilat with red band  Standing hip flexion SLR 2 X 10 bilat with red band  Standing hip extension 2 X 10 bilat with red band  Supine single knee to chest 3x10 sec hold Sidelying Lt clamshell 2x10 with 3 sec hold Sidelying Lt hip circles x 10 reps each direction Seated lumbar flexion stretch 10 sec X 5    02/21/23 Nu step L5 X 10 min UE/LE BERG balance test, see above for details Seated lumbar flexion stretch 5 sec X 10 ("no change in symptoms but feels like a good pull in my back") Standing lumbar extension stretch 5 sec X10 ("hurts some but a good hurt") Standing hip abd X 10 bilat with red band  Standing hip  flexion SLR X 10 bilat with red band  Standing hip extension X 10 bilat with red band    Eval 02/08/23 Objective measures, education on POC     PATIENT EDUCATION:  Education details: POC  Person educated: Patient Education method: Explanation Education comprehension: verbalized understanding and needs further education  HOME EXERCISE PROGRAM: Access Code: [facility] URL: [email or web address] Date: 02/21/2023 Prepared by: Ivery Quale  Exercises - Seated Hamstring Stretch  - 2 x daily - 6 x weekly - 1 sets - 3 reps - 20 sec hold - Seated Piriformis Stretch with Trunk Bend  - 2 x daily - 6 x weekly - 1 sets - 3 reps - 20 sec hold - Standing Lumbar Extension  - 2 x daily - 6 x weekly - 1-2 sets - 10 reps - 5 sec hold - Standing Hip Abduction with Resistance at Ankles and Counter Support  - 2 x daily - 6 x weekly - 1-2 sets - 10 reps - Standing Hip Extension with Resistance at Ankles and Counter Support  - 2 x daily - 6 x weekly - 1-2 sets - 10 reps - Standing Hip Flexion with Resistance Loop  - 2 x daily - 6 x weekly - 1-2 sets - 10 reps - Standing Romberg to 3/4 Tandem Stance  - 2 x daily - 6 x weekly - 1 sets - 3 reps - 30 sec hold - Romberg Stance with Head Nods  - 2 x daily - 6 x weekly - 2 sets - 10 reps ASSESSMENT:  CLINICAL IMPRESSION: Pt arriving to therapy almost 15 minutes late due to not feeling well. Pt reporting 4/10 pain in her low back and hip. Pt's 02 sats were taken, pt presenting 93% on RA at rest. Pt's exercise sats were 93% with HR between 75-82 bpm. Pt tolerating exercises well. Recommend conitnued skilled PT interventions.   OBJECTIVE IMPAIRMENTS: Abnormal gait, decreased activity tolerance, decreased balance, difficulty walking, decreased ROM, decreased strength, decreased safety awareness, increased fascial restrictions, increased muscle spasms, obesity, and pain.   ACTIVITY LIMITATIONS: carrying, lifting, bending, sitting, standing, transfers, bed  mobility, locomotion  level, and caring for others  PARTICIPATION LIMITATIONS: meal prep, cleaning, laundry, shopping, community activity, and yard work  PERSONAL FACTORS: Age, Behavior pattern, Education, Fitness, Past/current experiences, Social background, and Time since onset of injury/illness/exacerbation are also affecting patient's functional outcome.   REHAB POTENTIAL: Fair chronicity of pain  CLINICAL DECISION MAKING: Stable/uncomplicated  EVALUATION COMPLEXITY: Low   GOALS: Goals reviewed with patient? Yes  SHORT TERM GOALS: Target date: STGs = LTGs    LONG TERM GOALS: Target date: 03/08/2023    Will be compliant with appropriate progressive HEP  Baseline:  Goal status: INITIAL  2.  Pain to be no more than 3/10 at worst in back and hips  Baseline:  Goal status: INITIAL  3.  MMT to have improved by at least 1 grade in all weak groups  Baseline:  Goal status: INITIAL  4.  Will be able to perform all functional household tasks and desired exercise based activities with no increase in pain  Baseline:  Goal status: INITIAL  5.  FOTO score to be within 5 points of predicted by time of DC  Baseline:  Goal status: INITIAL  6.  Will score at least 50/56 on Berg to show reduced fall risk  Baseline:  Goal status: INITIAL  PLAN:  PT FREQUENCY: 1-2x/week  PT DURATION: 4 weeks  PLANNED INTERVENTIONS: Therapeutic exercises, Therapeutic activity, Neuromuscular re-education, Gait training, Self Care, Manual therapy, and Re-evaluation.  PLAN FOR NEXT SESSION: review/update HEP PRN,  functional strength and balance work as tolerated including core work   Narda Amber, PT, MPT 02/25/23 2:29 PM   02/25/23 2:29 PM     Referring diagnosis? M70.62 (ICD-10-CM) - Trochanteric bursitis, left hip M54.50,G89.29 (ICD-10-CM) - Chronic bilateral low back pain without sciatica Treatment diagnosis? (if different than referring diagnosis) M54.59, M25.552, M62.81,  Z91.81 What was this (referring dx) caused by? []  Surgery []  Fall [x]  Ongoing issue []  Arthritis []  Other: ____________  Laterality: []  Rt []  Lt [x]  Both  Check all possible CPT codes:  *CHOOSE 10 OR LESS*    []  40981 (Therapeutic Exercise)  []  92507 (SLP Treatment)  []  97112 (Neuro Re-ed)   []  92526 (Swallowing Treatment)   []  97116 (Gait Training)   []  [id number] (Cognitive Training, 1st 15 minutes) []  97140 (Manual Therapy)   []  97130 (Cognitive Training, each add'l 15 minutes)  []  97164 (Re-evaluation)                              []  Other, List CPT Code ____________  []  97530 (Therapeutic Activities)     []  97535 (Self Care)   [x]  All codes above (97110 - 97535)  []  97012 (Mechanical Traction)  []  97014 (E-stim Unattended)  []  97032 (E-stim manual)  []  97033 (Ionto)  []  97035 (Ultrasound) []  97750 (Physical Performance Training) []  [id number] (Aquatic Therapy) []  97016 (Vasopneumatic Device) []  [id number] (Paraffin) []  97034 (Contrast Bath) []  97597 (Wound Care 1st 20 sq cm) []  97598 (Wound Care each add'l 20 sq cm) []  97760 (Orthotic Fabrication, Fitting, Training Initial) []  [id number] (Prosthetic Management and Training Initial) []  [id number] (Orthotic or Prosthetic Training/ Modification Subsequent)

## 2023-02-27 ENCOUNTER — Encounter: Payer: Self-pay | Admitting: Physical Therapy

## 2023-02-27 ENCOUNTER — Ambulatory Visit: Payer: Medicare PPO | Admitting: Physical Therapy

## 2023-02-27 DIAGNOSIS — M6281 Muscle weakness (generalized): Secondary | ICD-10-CM

## 2023-02-27 DIAGNOSIS — Z9181 History of falling: Secondary | ICD-10-CM

## 2023-02-27 DIAGNOSIS — M25552 Pain in left hip: Secondary | ICD-10-CM | POA: Diagnosis not present

## 2023-02-27 DIAGNOSIS — M5459 Other low back pain: Secondary | ICD-10-CM

## 2023-02-27 NOTE — Therapy (Signed)
OUTPATIENT PHYSICAL THERAPY TREATMENT Patient Name: Whitney [name] MRN: [id number] DOB:[birth year], 67 y.o., female Today's Date: 02/27/2023  END OF SESSION:  PT End of Session - 02/27/23 1343     Visit Number 5    Number of Visits 9    Date for PT Re-Evaluation 03/08/23    Authorization Type [employer] MCR    Progress Note Due on Visit 10    PT Start Time 1341    PT Stop Time 1420    PT Time Calculation (min) 39 min    Activity Tolerance Patient tolerated treatment well    Behavior During Therapy WFL for tasks assessed/performed                 Past Medical History:  Diagnosis Date   Allergic rhinitis    Anemia    hx   Arthritis    Asthma    hx yrs ago   Cirrhosis (HCC) last albumin 3.3 done at [facility] 06-16-2014 (under care everywhere tab in epic)   Secondary to Fatty liver --  followed by hepatology at [facility] (dr Ardine Eng)    Depression    Diabetes mellitus type II    type 2 diet conrolled   Dyspnea    Fibromyalgia    GERD (gastroesophageal reflux disease)    Heart murmur    asymptomatic ---  1989 from rhuematic fever   History of exercise stress test    05-05-2013---  negative bruce ETT given exercise workload,  no ischemia   History of hiatal hernia    History of kidney stones    History of rheumatic fever    1989   Hyperlipidemia    Leukocytopenia    Moderate aortic stenosis    AVA area 1.1cm2---  cardiologist --  dr Lannie Fields, 2014 in epic   NASH (nonalcoholic steatohepatitis)    OSA (obstructive sleep apnea)    was using CPAP before gastric sleeve 2015--  no uses after wt loss   Pneumonia    hx   Sjogren's syndrome (HCC)    Thrombocytopenia (HCC)    Past Surgical History:  Procedure Laterality Date   AORTIC VALVE REPLACEMENT N/A 12/05/2017   Procedure: AORTIC VALVE REPLACEMENT (AVR) TISSUE VALVE INSPIRIS;  Surgeon: Alleen Borne, MD;  Location: [facility] OR;  Service: Open Heart Surgery;  Laterality: N/A;   COLONOSCOPY WITH ESOPHAGOGASTRODUODENOSCOPY  (EGD)     CYSTOSCOPY WITH RETROGRADE PYELOGRAM, URETEROSCOPY AND STENT PLACEMENT Left 01/21/2015   Procedure: CYSTOSCOPY WITH LEFT  RETROGRADE PYELOGRAM, LEFT URETEROSCOPY AND STENT PLACEMENT;  Surgeon: Jerilee Field, MD;  Location: [facility];  Service: Urology;  Laterality: Left;   CYSTOSCOPY WITH RETROGRADE PYELOGRAM, URETEROSCOPY AND STENT PLACEMENT Bilateral 02/24/2015   Procedure: CYSTOSCOPY WITH RIGHT RETROGRADE PYELOGRAM, BLADDER BIOPSY FULGERATION LEFT URETEROSCOPY AND STENT REPLACEMENT;  Surgeon: Jerilee Field, MD;  Location: [facility];  Service: Urology;  Laterality: Bilateral;   EXPLORATORY LAPAROSCOPY W/  CONE BIOPSY'S LEFT AND RIGHT LOBE OF LIVER  11-04-2007   HOLMIUM LASER APPLICATION Left 02/24/2015   Procedure: HOLMIUM LASER LITHOTRIPSY;  Surgeon: Jerilee Field, MD;  Location: [facility];  Service: Urology;  Laterality: Left;   HYSTEROSCOPY WITH D & C N/A 12/11/2012   Procedure: DILATATION AND CURETTAGE /HYSTEROSCOPY;  Surgeon: Dorien Chihuahua. Richardson Dopp, MD;  Location: [facility];  Service: Gynecology;  Laterality: N/A;   INGUINAL HERNIA REPAIR Left 10/22/2016   Procedure: LEFT INGUINAL HERNIA REPAIR;  Surgeon: Emelia Loron, MD;  Location: [facility] OR;  Service: General;  Laterality: Left;  TAP BLOCK   INSERTION OF MESH Left 10/22/2016   Procedure: INSERTION OF MESH;  Surgeon: Emelia Loron, MD;  Location: [facility] OR;  Service: General;  Laterality: Left;   LAPAROSCOPIC GASTRIC SLEEVE RESECTION  07-27-2013   LOOP RECORDER INSERTION N/A 04/16/2022   Procedure: LOOP RECORDER INSERTION;  Surgeon: [facility] Salvia, MD;  Location: [facility] INVASIVE CV LAB;  Service: Cardiovascular;  Laterality: N/A;   PUBOVAGINAL SLING  04-10-2001   SPARC   RIGHT/LEFT HEART CATH AND CORONARY ANGIOGRAPHY N/A 08/23/2017   Procedure: RIGHT/LEFT HEART CATH AND CORONARY ANGIOGRAPHY;  Surgeon: Corky Crafts, MD;  Location: [facility] INVASIVE CV LAB;  Service: Cardiovascular;   Laterality: N/A;   TEE WITHOUT CARDIOVERSION N/A 12/05/2017   Procedure: TRANSESOPHAGEAL ECHOCARDIOGRAM (TEE);  Surgeon: Alleen Borne, MD;  Location[facility] OR;  Service: Open Heart Surgery;  Laterality: N/A;   TONSILLECTOMY  1975   TRANSTHORACIC ECHOCARDIOGRAM  06-04-2012  dr Eldridge Dace   mild LVH,  grade I diastolic dysfunction/  ef 60-65%/  moderate LAE/  mild MV calcifation without stenosis,  mild MR/  moderate AV stenosis,  cannot r/o bicupsid, area 1.1cm2/  mild dilated aortic root/  trivial TR   Patient Active Problem List   Diagnosis Date Noted   Hypercoagulable state due to paroxysmal atrial fibrillation (HCC) 05/08/2022   Acute CVA (cerebrovascular accident) (HCC) 04/13/2022   COPD (chronic obstructive pulmonary disease) (HCC) 01/18/2021   Dyspnea 01/06/2019   Paroxysmal atrial fibrillation (HCC) 01/24/2018   S/P AVR 12/05/2017   Severe aortic stenosis    Inguinal hernia 10/22/2016   Lower extremity edema 06/22/2015   Aortic valve disorder 04/21/2013   Edema 04/21/2013   Depression    OSA (obstructive sleep apnea)    Sjogren's syndrome (HCC)    Cirrhosis (HCC)    Hyperlipidemia    Fibromyalgia    Postmenopausal bleeding 12/11/2012   Endometrial polyp 12/11/2012   Chronic cough 04/11/2011    PCP: Daisy Floro MD   REFERRING PROVIDER: Kathryne Hitch, MD  REFERRING DIAG: M70.62 (ICD-10-CM) - Trochanteric bursitis, left hip M54.50,G89.29 (ICD-10-CM) - Chronic bilateral low back pain without sciatica  Rationale for Evaluation and Treatment: Rehabilitation  THERAPY DIAG:  Other low back pain - Plan: PT plan of care cert/re-cert  Pain in left hip - Plan: PT plan of care cert/re-cert  Muscle weakness (generalized) - Plan: PT plan of care cert/re-cert  History of falling - Plan: PT plan of care cert/re-cert  ONSET DATE: MD referral 01/28/23  SUBJECTIVE:  SUBJECTIVE STATEMENT: My back hurts but other than that I'm doing okay.  PERTINENT HISTORY:  OA, depression, DM, fibromyalgia, hernia, hx rheumatic fever, HLD, moderate aortic stenosis, sjogren's, aortic valve replacement  PAIN:  Are you having pain? Yes: NPRS scale: 5-6/10 pain in her low back /10 Pain location: L hip and low back  Pain description: hip feels like someone punched me, back feels like a bad bruise that radiates into the bone  Aggravating factors: L hip climbing/any steps; back standing too long/doing rote things that take time Relieving factors: back- forward flexion/leaning on support; hip nothing   L hip 6-7/10 at worst, back at worst 8/10  PRECAUTIONS: None  RED FLAGS: None   WEIGHT BEARING RESTRICTIONS: No  FALLS:  Has patient fallen in last 6 months? Yes. Number of falls 1, tripped outdoors, severe FOF  LIVING ENVIRONMENT: Lives with: lives with their family Lives in: House/apartment Stairs:  1-2 STE  Has following equipment at home: Crutches  OCCUPATION: retired   PLOF: Independent, Independent with basic ADLs, Independent with gait, and Independent with transfers  PATIENT GOALS: less pain back and hip, no falls   NEXT MD VISIT: Magnus Ivan November 4th   OBJECTIVE:  Note: Objective measures were completed at Evaluation unless otherwise noted.  DIAGNOSTIC FINDINGS:  An AP and lateral lumbar spine show no acute findings.  The disc heights  and spaces are well-maintained and the alignment is well-maintained.  PATIENT SURVEYS:  02/22/23: FOTO 44 (predicted 47)   COGNITION: Overall cognitive status:  very tangential and easily distracted, may be baseline       SENSATION: Not tested   POSTURE: rounded shoulders, forward head, flexed trunk , and thoracic kyphosis, sits with R shoulder lower than L    LUMBAR ROM:   AROM eval  Flexion Mild limitation    Extension Mild limatation   Right lateral flexion Mild limitation   Left lateral flexion Mild limitation   Right rotation   Left rotation    (Blank rows = not tested)    LOWER EXTREMITY MMT:    MMT Right eval Left eval  Hip flexion 3+ 3+  Hip extension    Hip abduction 3+ seated  3+ seated   Hip adduction    Hip internal rotation    Hip external rotation    Knee flexion 4 4  Knee extension 4 4  Ankle dorsiflexion 5 5  Ankle plantarflexion    Ankle inversion    Ankle eversion     (Blank rows = not tested)  Significant core weakness noted    TODAY'S TREATMENT:                                                                                                                              DATE:  02/27/23 TherEx NuStep L7 x 8 min Seated Lt piriformis stretch 3x30 sec  Manual STM with compression to Lt glutes; skilled palpation and monitoring of soft tissue during  DN Trigger Point Dry-Needling  Treatment instructions: Expect mild to moderate muscle soreness. S/S of pneumothorax if dry needled over a lung field, and to seek immediate medical attention should they occur. Patient verbalized understanding of these instructions and education.  Patient Consent Given: Yes Education handout provided: Yes Muscles treated: Lt glute med Electrical stimulation performed: No Parameters: N/A Treatment response/outcome: twitch responses noted; decreased pain immediately following    02/25/23 TherEx NuStep L5 x 6 minutes min, level 7 x 2 minutes Standing lumbar extension 10 reps x 10 sec hold Standing hip abd 2 X 10 bilat with red band  Standing hip extension 2 X 10 bilat with red band  Supine glute stretch: x 3 bil 20 sec hold Supine dead bug: x 10 bil Supine lumbar/thoracic rotation: 2 x 20 sec hold Side-lying hip abd circles x 10 both directions bil  Side-lying reverse clams with yoga block between knees x 10  - during mat activities, pt's LE was elevated with bolster and  pillows to help pt's breathing.     02/22/23 TherEx NuStep L6 x 10 min Standing lumbar extension 10 reps x 10 sec hold Standing hip abd 2 X 10 bilat with red band  Standing hip flexion SLR 2 X 10 bilat with red band  Standing hip extension 2 X 10 bilat with red band  Supine single knee to chest 3x10 sec hold Sidelying Lt clamshell 2x10 with 3 sec hold Sidelying Lt hip circles x 10 reps each direction Seated lumbar flexion stretch 10 sec X 5     PATIENT EDUCATION:  Education details: POC  Person educated: Patient Education method: Explanation Education comprehension: verbalized understanding and needs further education  HOME EXERCISE PROGRAM: Access Code: [facility] URL: [email or web address] Date: 02/21/2023 Prepared by: Ivery Quale  Exercises - Seated Hamstring Stretch  - 2 x daily - 6 x weekly - 1 sets - 3 reps - 20 sec hold - Seated Piriformis Stretch with Trunk Bend  - 2 x daily - 6 x weekly - 1 sets - 3 reps - 20 sec hold - Standing Lumbar Extension  - 2 x daily - 6 x weekly - 1-2 sets - 10 reps - 5 sec hold - Standing Hip Abduction with Resistance at Ankles and Counter Support  - 2 x daily - 6 x weekly - 1-2 sets - 10 reps - Standing Hip Extension with Resistance at Ankles and Counter Support  - 2 x daily - 6 x weekly - 1-2 sets - 10 reps - Standing Hip Flexion with Resistance Loop  - 2 x daily - 6 x weekly - 1-2 sets - 10 reps - Standing Romberg to 3/4 Tandem Stance  - 2 x daily - 6 x weekly - 1 sets - 3 reps - 30 sec hold - Romberg Stance with Head Nods  - 2 x daily - 6 x weekly - 2 sets - 10 reps ASSESSMENT:  CLINICAL IMPRESSION: Trial of dry needling today to see if this helps with pain and exercises have only provided short-term relief.  Will continue to benefit from PT to maximize function.   OBJECTIVE IMPAIRMENTS: Abnormal gait, decreased activity tolerance, decreased balance, difficulty walking, decreased ROM, decreased strength, decreased  safety awareness, increased fascial restrictions, increased muscle spasms, obesity, and pain.   ACTIVITY LIMITATIONS: carrying, lifting, bending, sitting, standing, transfers, bed mobility, locomotion level, and caring for others  PARTICIPATION LIMITATIONS: meal prep, cleaning, laundry, shopping, community activity, and yard work  PERSONAL FACTORS: Age, Behavior pattern, Education,  Fitness, Past/current experiences, Social background, and Time since onset of injury/illness/exacerbation are also affecting patient's functional outcome.   REHAB POTENTIAL: Fair chronicity of pain  CLINICAL DECISION MAKING: Stable/uncomplicated  EVALUATION COMPLEXITY: Low   GOALS: Goals reviewed with patient? Yes  SHORT TERM GOALS: Target date: STGs = LTGs    LONG TERM GOALS: Target date: 03/08/2023    Will be compliant with appropriate progressive HEP  Baseline:  Goal status: INITIAL  2.  Pain to be no more than 3/10 at worst in back and hips  Baseline:  Goal status: INITIAL  3.  MMT to have improved by at least 1 grade in all weak groups  Baseline:  Goal status: INITIAL  4.  Will be able to perform all functional household tasks and desired exercise based activities with no increase in pain  Baseline:  Goal status: INITIAL  5.  FOTO score to be within 5 points of predicted by time of DC  Baseline:  Goal status: INITIAL  6.  Will score at least 50/56 on Berg to show reduced fall risk  Baseline:  Goal status: INITIAL  PLAN:  PT FREQUENCY: 1-2x/week  PT DURATION: 4 weeks  PLANNED INTERVENTIONS: 97164- PT Re-evaluation, 97110-Therapeutic exercises, 97530- Therapeutic activity, [id number]- Neuromuscular re-education, 97535- Self Care, 45409- Manual therapy, [id number]- Gait training, [id number]- Aquatic Therapy, 97014- Electrical stimulation (unattended), [id number]- Ultrasound, 81191- Ionotophoresis 4mg /ml Dexamethasone, Patient/Family education, Dry Needling, DME instructions, Cryotherapy, and Moist  heat.  PLAN FOR NEXT SESSION: assess response to DN, likely will need recert v/s d/c depending on pt request,  review/update HEP PRN,  functional strength and balance work as tolerated including core work      Clarita Crane, PT, DPT 02/27/23 2:36 PM     Referring diagnosis? M70.62 (ICD-10-CM) - Trochanteric bursitis, left hip M54.50,G89.29 (ICD-10-CM) - Chronic bilateral low back pain without sciatica Treatment diagnosis? (if different than referring diagnosis) M54.59, M25.552, M62.81, Z91.81 What was this (referring dx) caused by? []  Surgery []  Fall [x]  Ongoing issue []  Arthritis []  Other: ____________  Laterality: []  Rt []  Lt [x]  Both  Check all possible CPT codes:  *CHOOSE 10 OR LESS*    []  47829 (Therapeutic Exercise)  []  92507 (SLP Treatment)  []  56213 (Neuro Re-ed)   []  92526 (Swallowing Treatment)   []  97116 (Gait Training)   []  [id number] (Cognitive Training, 1st 15 minutes) []  97140 (Manual Therapy)   []  97130 (Cognitive Training, each add'l 15 minutes)  []  97164 (Re-evaluation)                              []  Other, List CPT Code ____________  []  08657 (Therapeutic Activities)     []  97535 (Self Care)   [x]  All codes above (97110 - 97535)  []  97012 (Mechanical Traction)  []  97014 (E-stim Unattended)  []  97032 (E-stim manual)  []  97033 (Ionto)  []  97035 (Ultrasound) []  97750 (Physical Performance Training) []  [id number] (Aquatic Therapy) []  97016 (Vasopneumatic Device) []  [id number] (Paraffin) []  97034 (Contrast Bath) []  97597 (Wound Care 1st 20 sq cm) []  97598 (Wound Care each add'l 20 sq cm) []  97760 (Orthotic Fabrication, Fitting, Training Initial) []  [id number] (Prosthetic Management and Training Initial) []  [id number] (Orthotic or Prosthetic Training/ Modification Subsequent)

## 2023-03-04 ENCOUNTER — Encounter: Payer: Medicare PPO | Admitting: Physical Therapy

## 2023-03-05 ENCOUNTER — Other Ambulatory Visit: Payer: Self-pay | Admitting: Emergency Medicine

## 2023-03-06 ENCOUNTER — Ambulatory Visit: Payer: Medicare PPO | Admitting: Physical Therapy

## 2023-03-06 ENCOUNTER — Encounter: Payer: Self-pay | Admitting: Physical Therapy

## 2023-03-06 DIAGNOSIS — Z9181 History of falling: Secondary | ICD-10-CM | POA: Diagnosis not present

## 2023-03-06 DIAGNOSIS — M6281 Muscle weakness (generalized): Secondary | ICD-10-CM | POA: Diagnosis not present

## 2023-03-06 DIAGNOSIS — M5459 Other low back pain: Secondary | ICD-10-CM

## 2023-03-06 DIAGNOSIS — M25552 Pain in left hip: Secondary | ICD-10-CM | POA: Diagnosis not present

## 2023-03-06 NOTE — Therapy (Signed)
OUTPATIENT PHYSICAL THERAPY TREATMENT Patient Name: Whitney [name] MRN: [id number] DOB:19-Oct-1955, 67 y.o., female Today's Date: 03/06/2023  END OF SESSION:  PT End of Session - 03/06/23 1346     Visit Number 6    Number of Visits 9    Date for PT Re-Evaluation 03/08/23    Authorization Type [employer] MCR    Progress Note Due on Visit 10    PT Start Time 1343    PT Stop Time 1413    PT Time Calculation (min) 30 min    Activity Tolerance Patient tolerated treatment well    Behavior During Therapy WFL for tasks assessed/performed                  Past Medical History:  Diagnosis Date   Allergic rhinitis    Anemia    hx   Arthritis    Asthma    hx yrs ago   Cirrhosis (HCC) last albumin 3.3 done at [facility] 06-16-2014 (under care everywhere tab in epic)   Secondary to Fatty liver --  followed by hepatology at [facility] (dr Ardine Eng)    Depression    Diabetes mellitus type II    type 2 diet conrolled   Dyspnea    Fibromyalgia    GERD (gastroesophageal reflux disease)    Heart murmur    asymptomatic ---  1989 from rhuematic fever   History of exercise stress test    05-05-2013---  negative bruce ETT given exercise workload,  no ischemia   History of hiatal hernia    History of kidney stones    History of rheumatic fever    1989   Hyperlipidemia    Leukocytopenia    Moderate aortic stenosis    AVA area 1.1cm2---  cardiologist --  dr Lannie Fields, 2014 in epic   NASH (nonalcoholic steatohepatitis)    OSA (obstructive sleep apnea)    was using CPAP before gastric sleeve 2015--  no uses after wt loss   Pneumonia    hx   Sjogren's syndrome (HCC)    Thrombocytopenia (HCC)    Past Surgical History:  Procedure Laterality Date   AORTIC VALVE REPLACEMENT N/A 12/05/2017   Procedure: AORTIC VALVE REPLACEMENT (AVR) TISSUE VALVE INSPIRIS;  Surgeon: Alleen Borne, MD;  Location: [facility] OR;  Service: Open Heart Surgery;  Laterality: N/A;   COLONOSCOPY WITH  ESOPHAGOGASTRODUODENOSCOPY (EGD)     CYSTOSCOPY WITH RETROGRADE PYELOGRAM, URETEROSCOPY AND STENT PLACEMENT Left 01/21/2015   Procedure: CYSTOSCOPY WITH LEFT  RETROGRADE PYELOGRAM, LEFT URETEROSCOPY AND STENT PLACEMENT;  Surgeon: Jerilee Field, MD;  Location: [facility];  Service: Urology;  Laterality: Left;   CYSTOSCOPY WITH RETROGRADE PYELOGRAM, URETEROSCOPY AND STENT PLACEMENT Bilateral 02/24/2015   Procedure: CYSTOSCOPY WITH RIGHT RETROGRADE PYELOGRAM, BLADDER BIOPSY FULGERATION LEFT URETEROSCOPY AND STENT REPLACEMENT;  Surgeon: Jerilee Field, MD;  Location: [facility];  Service: Urology;  Laterality: Bilateral;   EXPLORATORY LAPAROSCOPY W/  CONE BIOPSY'S LEFT AND RIGHT LOBE OF LIVER  11-04-2007   HOLMIUM LASER APPLICATION Left 02/24/2015   Procedure: HOLMIUM LASER LITHOTRIPSY;  Surgeon: Jerilee Field, MD;  Location: [facility];  Service: Urology;  Laterality: Left;   HYSTEROSCOPY WITH D & C N/A 12/11/2012   Procedure: DILATATION AND CURETTAGE /HYSTEROSCOPY;  Surgeon: Dorien Chihuahua. Richardson Dopp, MD;  Location: [facility];  Service: Gynecology;  Laterality: N/A;   INGUINAL HERNIA REPAIR Left 10/22/2016   Procedure: LEFT INGUINAL HERNIA REPAIR;  Surgeon: Emelia Loron, MD;  Location: [facility]  OR;  Service: General;  Laterality: Left;  TAP BLOCK   INSERTION OF MESH Left 10/22/2016   Procedure: INSERTION OF MESH;  Surgeon: Emelia Loron, MD;  Location: [facility] OR;  Service: General;  Laterality: Left;   LAPAROSCOPIC GASTRIC SLEEVE RESECTION  07-27-2013   LOOP RECORDER INSERTION N/A 04/16/2022   Procedure: LOOP RECORDER INSERTION;  Surgeon: [facility] Salvia, MD;  Location: [facility] INVASIVE CV LAB;  Service: Cardiovascular;  Laterality: N/A;   PUBOVAGINAL SLING  04-10-2001   SPARC   RIGHT/LEFT HEART CATH AND CORONARY ANGIOGRAPHY N/A 08/23/2017   Procedure: RIGHT/LEFT HEART CATH AND CORONARY ANGIOGRAPHY;  Surgeon: Corky Crafts, MD;  Location: [facility] INVASIVE CV LAB;   Service: Cardiovascular;  Laterality: N/A;   TEE WITHOUT CARDIOVERSION N/A 12/05/2017   Procedure: TRANSESOPHAGEAL ECHOCARDIOGRAM (TEE);  Surgeon: Alleen Borne, MD;  Location: [facility] OR;  Service: Open Heart Surgery;  Laterality: N/A;   TONSILLECTOMY  1975   TRANSTHORACIC ECHOCARDIOGRAM  06-04-2012  dr Eldridge Dace   mild LVH,  grade I diastolic dysfunction/  ef 60-65%/  moderate LAE/  mild MV calcifation without stenosis,  mild MR/  moderate AV stenosis,  cannot r/o bicupsid, area 1.1cm2/  mild dilated aortic root/  trivial TR   Patient Active Problem List   Diagnosis Date Noted   Hypercoagulable state due to paroxysmal atrial fibrillation (HCC) 05/08/2022   Acute CVA (cerebrovascular accident) (HCC) 04/13/2022   COPD (chronic obstructive pulmonary disease) (HCC) 01/18/2021   Dyspnea 01/06/2019   Paroxysmal atrial fibrillation (HCC) 01/24/2018   S/P AVR 12/05/2017   Severe aortic stenosis    Inguinal hernia 10/22/2016   Lower extremity edema 06/22/2015   Aortic valve disorder 04/21/2013   Edema 04/21/2013   Depression    OSA (obstructive sleep apnea)    Sjogren's syndrome (HCC)    Cirrhosis (HCC)    Hyperlipidemia    Fibromyalgia    Postmenopausal bleeding 12/11/2012   Endometrial polyp 12/11/2012   Chronic cough 04/11/2011    PCP: Daisy Floro MD   REFERRING PROVIDER: Kathryne Hitch, MD  REFERRING DIAG: M70.62 (ICD-10-CM) - Trochanteric bursitis, left hip M54.50,G89.29 (ICD-10-CM) - Chronic bilateral low back pain without sciatica  Rationale for Evaluation and Treatment: Rehabilitation  THERAPY DIAG:  Other low back pain  Pain in left hip  Muscle weakness (generalized)  History of falling  ONSET DATE: MD referral 01/28/23  SUBJECTIVE:                                                                                                                                                                                           SUBJECTIVE STATEMENT: Still limping  a  little bit but the pain seems to be mostly resolved except when touching it.   PERTINENT HISTORY:  OA, depression, DM, fibromyalgia, hernia, hx rheumatic fever, HLD, moderate aortic stenosis, sjogren's, aortic valve replacement  PAIN:  Are you having pain? Yes: NPRS scale: 2-3 /10 Pain location: L hip and low back  Pain description: hip feels like someone punched me, back feels like a bad bruise that radiates into the bone  Aggravating factors: L hip climbing/any steps; back standing too long/doing rote things that take time Relieving factors: back- forward flexion/leaning on support; hip nothing   L hip 6-7/10 at worst, back at worst 8/10  PRECAUTIONS: None  RED FLAGS: None   WEIGHT BEARING RESTRICTIONS: No  FALLS:  Has patient fallen in last 6 months? Yes. Number of falls 1, tripped outdoors, severe FOF  LIVING ENVIRONMENT: Lives with: lives with their family Lives in: House/apartment Stairs:  1-2 STE  Has following equipment at home: Crutches  OCCUPATION: retired   PLOF: Independent, Independent with basic ADLs, Independent with gait, and Independent with transfers  PATIENT GOALS: less pain back and hip, no falls   NEXT MD VISIT: Magnus Ivan November 4th   OBJECTIVE:  Note: Objective measures were completed at Evaluation unless otherwise noted.  DIAGNOSTIC FINDINGS:  An AP and lateral lumbar spine show no acute findings.  The disc heights  and spaces are well-maintained and the alignment is well-maintained.  PATIENT SURVEYS:  02/22/23: FOTO 44 (predicted 47) 03/06/23: FOTO 44   COGNITION: Overall cognitive status:  very tangential and easily distracted, may be baseline       SENSATION: Not tested   POSTURE: rounded shoulders, forward head, flexed trunk , and thoracic kyphosis, sits with R shoulder lower than L    LUMBAR ROM:   AROM eval  Flexion Mild limitation   Extension Mild limatation   Right lateral flexion Mild limitation   Left lateral  flexion Mild limitation   Right rotation   Left rotation    (Blank rows = not tested)    LOWER EXTREMITY MMT:    MMT Right eval Left eval  Hip flexion 3+ 3+  Hip extension    Hip abduction 3+ seated  3+ seated   Hip adduction    Hip internal rotation    Hip external rotation    Knee flexion 4 4  Knee extension 4 4  Ankle dorsiflexion 5 5  Ankle plantarflexion    Ankle inversion    Ankle eversion     (Blank rows = not tested)  Significant core weakness noted    TODAY'S TREATMENT:                                                                                                                              DATE:  03/06/23 TherEx NuStep L7 x 8 min  Manual STM with compression to Lt glutes; skilled palpation and monitoring of soft tissue during DN Trigger Point  Dry-Needling  Treatment instructions: Expect mild to moderate muscle soreness. S/S of pneumothorax if dry needled over a lung field, and to seek immediate medical attention should they occur. Patient verbalized understanding of these instructions and education.  Patient Consent Given: Yes Education handout provided: Yes Muscles treated: Lt glute med Electrical stimulation performed: No Parameters: N/A Treatment response/outcome: twitch responses noted; decreased pain immediately following  02/27/23 TherEx NuStep L7 x 8 min Seated Lt piriformis stretch 3x30 sec  Manual STM with compression to Lt glutes; skilled palpation and monitoring of soft tissue during DN Trigger Point Dry-Needling  Treatment instructions: Expect mild to moderate muscle soreness. S/S of pneumothorax if dry needled over a lung field, and to seek immediate medical attention should they occur. Patient verbalized understanding of these instructions and education.  Patient Consent Given: Yes Education handout provided: Yes Muscles treated: Lt glute med Electrical stimulation performed: No Parameters: N/A Treatment response/outcome: twitch  responses noted; decreased pain immediately following    02/25/23 TherEx NuStep L5 x 6 minutes min, level 7 x 2 minutes Standing lumbar extension 10 reps x 10 sec hold Standing hip abd 2 X 10 bilat with red band  Standing hip extension 2 X 10 bilat with red band  Supine glute stretch: x 3 bil 20 sec hold Supine dead bug: x 10 bil Supine lumbar/thoracic rotation: 2 x 20 sec hold Side-lying hip abd circles x 10 both directions bil  Side-lying reverse clams with yoga block between knees x 10  - during mat activities, pt's LE was elevated with bolster and pillows to help pt's breathing.     02/22/23 TherEx NuStep L6 x 10 min Standing lumbar extension 10 reps x 10 sec hold Standing hip abd 2 X 10 bilat with red band  Standing hip flexion SLR 2 X 10 bilat with red band  Standing hip extension 2 X 10 bilat with red band  Supine single knee to chest 3x10 sec hold Sidelying Lt clamshell 2x10 with 3 sec hold Sidelying Lt hip circles x 10 reps each direction Seated lumbar flexion stretch 10 sec X 5     PATIENT EDUCATION:  Education details: POC  Person educated: Patient Education method: Explanation Education comprehension: verbalized understanding and needs further education  HOME EXERCISE PROGRAM: Access Code: [facility] URL: [email or web address] Date: 02/21/2023 Prepared by: Ivery Quale  Exercises - Seated Hamstring Stretch  - 2 x daily - 6 x weekly - 1 sets - 3 reps - 20 sec hold - Seated Piriformis Stretch with Trunk Bend  - 2 x daily - 6 x weekly - 1 sets - 3 reps - 20 sec hold - Standing Lumbar Extension  - 2 x daily - 6 x weekly - 1-2 sets - 10 reps - 5 sec hold - Standing Hip Abduction with Resistance at Ankles and Counter Support  - 2 x daily - 6 x weekly - 1-2 sets - 10 reps - Standing Hip Extension with Resistance at Ankles and Counter Support  - 2 x daily - 6 x weekly - 1-2 sets - 10 reps - Standing Hip Flexion with Resistance Loop  - 2 x daily - 6 x  weekly - 1-2 sets - 10 reps - Standing Romberg to 3/4 Tandem Stance  - 2 x daily - 6 x weekly - 1 sets - 3 reps - 30 sec hold - Romberg Stance with Head Nods  - 2 x daily - 6 x weekly - 2 sets - 10 reps ASSESSMENT:  CLINICAL IMPRESSION:  Lt hip pain has largely improved but she still feels pain in her back.  At this time she would like to follow up with MD and will let us know about returning to PT.  Feel she will benefit from additional PT sessions but will await recommendations from MD.   OBJECTIVE IMPAIRMENTS: Abnormal gait, decreased activity tolerance, decreased balance, difficulty walking, decreased ROM, decreased strength, decreased safety awareness, increased fascial restrictions, increased muscle spasms, obesity, and pain.   ACTIVITY LIMITATIONS: carrying, lifting, bending, sitting, standing, transfers, bed mobility, locomotion level, and caring for others  PARTICIPATION LIMITATIONS: meal prep, cleaning, laundry, shopping, community activity, and yard work  PERSONAL FACTORS: Age, Behavior pattern, Education, Fitness, Past/current experiences, Social background, and Time since onset of injury/illness/exacerbation are also affecting patient's functional outcome.   REHAB POTENTIAL: Fair chronicity of pain  CLINICAL DECISION MAKING: Stable/uncomplicated  EVALUATION COMPLEXITY: Low   GOALS: Goals reviewed with patient? Yes  SHORT TERM GOALS: Target date: STGs = LTGs    LONG TERM GOALS: Target date: 03/08/2023    Will be compliant with appropriate progressive HEP  Baseline:  Goal status: ONGOING 03/06/23  2.  Pain to be no more than 3/10 at worst in back and hips  Baseline:  Goal status: ONGOING 03/06/23  3.  MMT to have improved by at least 1 grade in all weak groups  Baseline:  Goal status: ONGOING 03/06/23  4.  Will be able to perform all functional household tasks and desired exercise based activities with no increase in pain  Baseline:  Goal status: ONGOING  03/06/23  5.  FOTO score to be within 5 points of predicted by time of DC  Baseline:  Goal status: ONGOING 03/06/23  6.  Will score at least 50/56 on Berg to show reduced fall risk  Baseline:  Goal status: ONGOING 03/06/23  PLAN:  PT FREQUENCY: 1-2x/week  PT DURATION: 4 weeks  PLANNED INTERVENTIONS: 97164- PT Re-evaluation, 97110-Therapeutic exercises, 97530- Therapeutic activity, 97112- Neuromuscular re-education, 97535- Self Care, 16109- Manual therapy, [id number]- Gait training, [id number]- Aquatic Therapy, 97014- Electrical stimulation (unattended), [id number]- Ultrasound, 60454- Ionotophoresis 4mg /ml Dexamethasone, Patient/Family education, Dry Needling, DME instructions, Cryotherapy, and Moist heat.  PLAN FOR NEXT SESSION: will need recert if you return; otherwise will plan to d/c from PT.     Clarita Crane, PT, DPT 03/06/23 2:20 PM     Referring diagnosis? M70.62 (ICD-10-CM) - Trochanteric bursitis, left hip M54.50,G89.29 (ICD-10-CM) - Chronic bilateral low back pain without sciatica Treatment diagnosis? (if different than referring diagnosis) M54.59, M25.552, M62.81, Z91.81 What was this (referring dx) caused by? []  Surgery []  Fall [x]  Ongoing issue []  Arthritis []  Other: ____________  Laterality: []  Rt []  Lt [x]  Both  Check all possible CPT codes:  *CHOOSE 10 OR LESS*    []  97110 (Therapeutic Exercise)  []  92507 (SLP Treatment)  []  09811 (Neuro Re-ed)   []  92526 (Swallowing Treatment)   []  97116 (Gait Training)   []  [id number] (Cognitive Training, 1st 15 minutes) []  97140 (Manual Therapy)   []  97130 (Cognitive Training, each add'l 15 minutes)  []  97164 (Re-evaluation)                              []  Other, List CPT Code ____________  []  97530 (Therapeutic Activities)     []  97535 (Self Care)   [x]  All codes above (97110 - 97535)  []  97012 (Mechanical Traction)  []  97014 (E-stim Unattended)  []  91478 (  E-stim manual)  []  69629 (Ionto)  []  97035 (Ultrasound) []  97750  (Physical Performance Training) []  [id number] (Aquatic Therapy) []  97016 (Vasopneumatic Device) []  [id number] (Paraffin) []  97034 (Contrast Bath) []  97597 (Wound Care 1st 20 sq cm) []  97598 (Wound Care each add'l 20 sq cm) []  97760 (Orthotic Fabrication, Fitting, Training Initial) []  [id number] (Prosthetic Management and Training Initial) []  [id number] (Orthotic or Prosthetic Training/ Modification Subsequent)

## 2023-03-08 ENCOUNTER — Encounter: Payer: Medicare PPO | Admitting: Physical Therapy

## 2023-03-11 ENCOUNTER — Encounter: Payer: Medicare PPO | Admitting: Physical Therapy

## 2023-03-11 ENCOUNTER — Ambulatory Visit: Payer: Medicare PPO | Admitting: Orthopaedic Surgery

## 2023-03-12 ENCOUNTER — Ambulatory Visit: Payer: Medicare PPO | Admitting: Nurse Practitioner

## 2023-03-12 ENCOUNTER — Ambulatory Visit: Payer: Medicare PPO

## 2023-03-12 ENCOUNTER — Encounter: Payer: Self-pay | Admitting: Nurse Practitioner

## 2023-03-12 VITALS — BP 136/78 | HR 85 | Temp 98.9°F | Ht 64.0 in | Wt 230.6 lb

## 2023-03-12 DIAGNOSIS — R0602 Shortness of breath: Secondary | ICD-10-CM | POA: Diagnosis not present

## 2023-03-12 DIAGNOSIS — J9601 Acute respiratory failure with hypoxia: Secondary | ICD-10-CM

## 2023-03-12 DIAGNOSIS — Z8673 Personal history of transient ischemic attack (TIA), and cerebral infarction without residual deficits: Secondary | ICD-10-CM | POA: Insufficient documentation

## 2023-03-12 DIAGNOSIS — J986 Disorders of diaphragm: Secondary | ICD-10-CM | POA: Diagnosis not present

## 2023-03-12 DIAGNOSIS — J441 Chronic obstructive pulmonary disease with (acute) exacerbation: Secondary | ICD-10-CM

## 2023-03-12 DIAGNOSIS — M3500 Sicca syndrome, unspecified: Secondary | ICD-10-CM | POA: Diagnosis not present

## 2023-03-12 DIAGNOSIS — R9389 Abnormal findings on diagnostic imaging of other specified body structures: Secondary | ICD-10-CM | POA: Diagnosis not present

## 2023-03-12 DIAGNOSIS — I480 Paroxysmal atrial fibrillation: Secondary | ICD-10-CM

## 2023-03-12 DIAGNOSIS — I517 Cardiomegaly: Secondary | ICD-10-CM | POA: Diagnosis not present

## 2023-03-12 DIAGNOSIS — R059 Cough, unspecified: Secondary | ICD-10-CM | POA: Diagnosis not present

## 2023-03-12 DIAGNOSIS — R0609 Other forms of dyspnea: Secondary | ICD-10-CM

## 2023-03-12 DIAGNOSIS — J9600 Acute respiratory failure, unspecified whether with hypoxia or hypercapnia: Secondary | ICD-10-CM | POA: Insufficient documentation

## 2023-03-12 LAB — POCT INFLUENZA A/B
Influenza A, POC: NEGATIVE
Influenza B, POC: NEGATIVE

## 2023-03-12 LAB — POC COVID19 BINAXNOW: SARS Coronavirus 2 Ag: NEGATIVE

## 2023-03-12 MED ORDER — ALBUTEROL SULFATE (2.5 MG/3ML) 0.083% IN NEBU: 2.5000 mg | INHALATION_SOLUTION | Freq: Four times a day (QID) | RESPIRATORY_TRACT | 5 refills | Status: DC | PRN

## 2023-03-12 MED ORDER — METHYLPREDNISOLONE ACETATE 80 MG/ML IJ SUSP
80.0000 mg | Freq: Once | INTRAMUSCULAR | Status: AC
  Administered 2023-03-12: 80 mg via INTRAMUSCULAR

## 2023-03-12 MED ORDER — PREDNISONE 10 MG PO TABS: ORAL_TABLET | ORAL | 0 refills | Status: DC

## 2023-03-12 MED ORDER — AMOXICILLIN-POT CLAVULANATE 875-125 MG PO TABS: 1.0000 | ORAL_TABLET | Freq: Two times a day (BID) | ORAL | 0 refills | Status: DC

## 2023-03-12 MED ORDER — BENZONATATE 200 MG PO CAPS: 200.0000 mg | ORAL_CAPSULE | Freq: Three times a day (TID) | ORAL | 1 refills | Status: DC | PRN

## 2023-03-12 MED ORDER — PROMETHAZINE-DM 6.25-15 MG/5ML PO SYRP: 5.0000 mL | ORAL_SOLUTION | Freq: Four times a day (QID) | ORAL | 0 refills | Status: DC | PRN

## 2023-03-12 MED ORDER — IPRATROPIUM-ALBUTEROL 0.5-2.5 (3) MG/3ML IN SOLN
3.0000 mL | Freq: Once | RESPIRATORY_TRACT | Status: AC
  Administered 2023-03-12: 3 mL via RESPIRATORY_TRACT

## 2023-03-12 NOTE — Addendum Note (Signed)
Addended byClyda Greener M on: 03/12/2023 05:35 PM   Modules accepted: Orders

## 2023-03-12 NOTE — Progress Notes (Signed)
@Patient  ID: Whitney [name], female    DOB: 1955/07/05, 67 y.o.   MRN: [id number]  Chief Complaint  Patient presents with   Follow-up    Dyspnea and SOB x 4 days.  Cough and wheeze with chest congestion.  Mucus production with cough is gray.    Referring provider: Daisy Floro, MD  HPI: 67 year old female, never smoker followed for COPD, chronic cough, and OSA on CPAP.  She is a patient Dr. Kavin Leech and last seen in office 01/18/2022.  Past medical history significant for CVA, severe aortic stenosis status post TAVR, A-fib on chronic AC, NASH cirrhosis, fibromyalgia, HLD, Sjogren's.  TEST/EVENTS:  12/19/2018 PFT: FVC 42, FEV1 46, ratio 87, TLC 64, DLCOcor 71.  Severe restrictive lung disease 04/14/2022 HRCT chest: Atherosclerosis.  Aortic valve prosthesis.  Cardiomegaly.  Enlargement of main pulmonary artery.  Trace bilateral pleural effusions.  Associated atelectasis or consolidation.  Scarring and volume loss of right lung base.  Chronic elevation of right hemidiaphragm.  No evidence of ILD.  01/18/2022: OV with Dr. Delton Coombes.  Chronic cough likely due to upper airway dryness from Sjogren's.  Can try empiric PPI to see if she has any benefit or change in cough burden.  Continue Stiolto 2 puffs once daily for COPD management.  Continue CPAP nightly.  Good compliance.  Follows with Dr. Wynona Neat.   03/12/2023: Today-acute Discussed the use of AI scribe software for clinical note transcription with the patient, who gave verbal consent to proceed.  History of Present Illness   The patient, a 67 year old with a complex medical history including aortic valve replacement, COPD, and Sjogren's syndrome, presents with a four-day history of worsening shortness of breath and productive cough. The sputum is described as grayish-white in color. The patient denies fever; reports a maximum temperature of 99.4 degrees Fahrenheit. The patient also reports difficulty lying flat due to the severity of the dyspnea.    The patient's cough was tinged with red during the first two days of the illness, but this has since resolved. The patient denies sinus symptoms, congestion, or postnasal drainage. Despite the respiratory symptoms, the patient reports normal appetite and fluid intake. There is a slight increase in ankle swelling by the end of the day, but this is not significantly different from the patient's baseline. The patient's weight has increased by seven pounds since a visit with neurology in September. She does not monitor her weights routinely at home. No CP.   The patient has a history of rheumatic fever in her late thirties and has been mindful of her health since then. The patient also reports a Transient Ischemic Attack (TIA) in December. Some residual balance issues. No current stroke like symptoms. The patient has been compliant with her Eliquis medication. No abnormal bruising or bleeding.   The patient's complex medical history and current symptoms have significantly impacted her quality of life, limiting her physical activity and causing significant distress. The patient is also the primary caregiver for a family member with significant health issues, adding to her stress. The patient has not received her annual flu shot or five-year pneumonia vaccine due to personal circumstances.   Upon arrival to the exam room, she was noted to be 85% on room air. She was placed on supplemental oxygen 3 lpm and recovered to the 90's. She tells me that she feels much better on the oxygen. No longer feeling lightheaded. Feels like she can breathe easier. Never been on O2 before.  Allergies  Allergen Reactions   Doxycycline Hives and Rash   Naproxen Rash and Hives    Immunization History  Administered Date(s) Administered   Fluad Quad(high Dose 65+) 01/06/2019, 01/18/2022   Hep A / Hep B 02/11/2013, 03/06/2013, 08/19/2013   Influenza Split 02/25/2011, 01/14/2019   Influenza, Seasonal, Injecte,  Preservative Fre 02/11/2013   Influenza,inj,Quad PF,6+ Mos 02/13/2011   Influenza,inj,Quad PF,6-35 Mos 01/31/2018   PFIZER(Purple Top)SARS-COV-2 Vaccination 05/28/2019, 06/17/2019, 01/10/2020, 08/08/2020   PNEUMOCOCCAL CONJUGATE-20 06/20/2021   Pfizer Covid-19 Vaccine Bivalent Booster 69yrs & up 02/20/2021   Pneumococcal Polysaccharide-23 02/06/2019   Tdap 06/30/2010    Past Medical History:  Diagnosis Date   Allergic rhinitis    Anemia    hx   Arthritis    Asthma    hx yrs ago   Cirrhosis (HCC) last albumin 3.3 done at [facility] 06-16-2014 (under care everywhere tab in epic)   Secondary to Fatty liver --  followed by hepatology at [facility] (dr Ardine Eng)    Depression    Diabetes mellitus type II    type 2 diet conrolled   Dyspnea    Fibromyalgia    GERD (gastroesophageal reflux disease)    Heart murmur    asymptomatic ---  1989 from rhuematic fever   History of exercise stress test    05-05-2013---  negative bruce ETT given exercise workload,  no ischemia   History of hiatal hernia    History of kidney stones    History of rheumatic fever    1989   Hyperlipidemia    Leukocytopenia    Moderate aortic stenosis    AVA area 1.1cm2---  cardiologist --  dr Lannie Fields, 2014 in epic   NASH (nonalcoholic steatohepatitis)    OSA (obstructive sleep apnea)    was using CPAP before gastric sleeve 2015--  no uses after wt loss   Pneumonia    hx   Sjogren's syndrome (HCC)    Thrombocytopenia (HCC)     Tobacco History: Social History   Tobacco Use  Smoking Status Never  Smokeless Tobacco Never  Tobacco Comments   Never smoke 05/08/22   Counseling given: Not Answered Tobacco comments: Never smoke 05/08/22   Outpatient Medications Prior to Visit  Medication Sig Dispense Refill   albuterol (VENTOLIN HFA) 108 (90 Base) MCG/ACT inhaler INHALE 2 PUFFS INTO THE LUNGS EVERY 4 HOURS AS NEEDED FOR WHEEZING OR SHORTNESS OF BREATH. 8.5 each 5   amoxicillin (AMOXIL) 500 MG capsule Take 4  capsules 1 hour prior to any dental procedures 4 capsule 3   ELIQUIS 5 MG TABS tablet TAKE 1 TABLET BY MOUTH TWICE A DAY 90 tablet 2   ezetimibe (ZETIA) 10 MG tablet Take 10 mg by mouth at bedtime.   11   gabapentin (NEURONTIN) 100 MG capsule Take 1 capsule (100 mg total) by mouth 2 (two) times daily at 10 am and 4 pm AND 3 capsules (300 mg total) at bedtime. 150 capsule 5   milk thistle 175 MG tablet Take 175 mg by mouth See admin instructions. Take 175 mg by mouth every 2-3 days     nadolol (CORGARD) 40 MG tablet TAKE 1 TABLET BY MOUTH EVERY DAY 90 tablet 2   OZEMPIC, 0.25 OR 0.5 MG/DOSE, 2 MG/3ML SOPN Inject 2 mg into the skin once a week.     Tiotropium Bromide-Olodaterol (STIOLTO RESPIMAT) 2.5-2.5 MCG/ACT AERS INHALE 2 PUFFS INTO THE LUNGS DAILY. INHALE 2 PUFFS BY MOUTH INTO THE LUNGS DAILY 12 g  0   No facility-administered medications prior to visit.     Review of Systems:   Constitutional: No night sweats, fevers, chills, or lassitude. +fatigue, weight gain HEENT: No headaches, difficulty swallowing, tooth/dental problems, or sore throat. No sneezing, itching, ear ache, nasal congestion, or post nasal drip CV:  No chest pain, orthopnea, PND, swelling in lower extremities, anasarca, dizziness, palpitations, syncope Resp: +shortness of breath with exertion; productive cough; wheezing; scant hemoptysis (resolved). No chest wall deformity GI:  No heartburn, indigestion, abdominal pain, nausea, vomiting, diarrhea, change in bowel habits, loss of appetite, bloody stools.  GU: No dysuria, change in color of urine, urgency or frequency.  No flank pain, no hematuria  Skin: No rash, lesions, ulcerations MSK:  No joint pain or swelling.   Neuro: No dizziness or lightheadedness.  Psych: No depression or anxiety. Mood stable.     Physical Exam:  BP 136/78 (BP Location: Right Arm, Patient Position: Sitting, Cuff Size: Large)   Pulse 85   Temp 98.9 F (37.2 C) (Oral)   Ht 5\' 4"  (1.626 m)    Wt 230 lb 9.6 oz (104.6 kg)   SpO2 97% Comment: 3L continuous oxygen (tank)  BMI 39.58 kg/m   GEN: Pleasant, interactive, well-kempt; obese;non-toxic and in no acute distress HEENT:  Normocephalic and atraumatic. EACs patent bilaterally. TM pearly gray with present light reflex bilaterally. PERRLA. Sclera white. Nasal turbinates pink, moist and patent bilaterally. No rhinorrhea present. Oropharynx pink and moist, without exudate or edema. No lesions, ulcerations, or postnasal drip.  NECK:  Supple w/ fair ROM. No JVD present. Normal carotid impulses w/o bruits. Thyroid symmetrical with no goiter or nodules palpated. No lymphadenopathy.   CV: Regular rhythm, rate controlled, no m/r/g, no peripheral edema. Pulses intact, +2 bilaterally. No cyanosis, pallor or clubbing. PULMONARY:  Unlabored, regular breathing. Scattered expiratory wheezes bilaterally A&P. Congested cough. No accessory muscle use.  GI: BS present and normoactive. Soft, non-tender to palpation. No organomegaly or masses detected.  MSK: No erythema, warmth or tenderness. Cap refil <2 sec all extrem. No deformities or joint swelling noted.  Neuro: A/Ox3. No focal deficits noted.   Skin: Warm, no lesions or rashe Psych: Normal affect and behavior. Judgement and thought content appropriate.     Lab Results:  CBC    Component Value Date/Time   WBC 3.1 (L) 08/02/2022 1051   WBC 2.5 (L) 06/07/2022 0947   RBC 4.63 08/02/2022 1051   HGB 14.5 08/02/2022 1051   HGB 12.5 01/24/2018 1148   HGB 12.9 01/30/2013 1319   HCT 42.1 08/02/2022 1051   HCT 38.5 01/24/2018 1148   HCT 39.1 01/30/2013 1319   PLT 71 (L) 08/02/2022 1051   PLT 93 (LL) 01/24/2018 1148   MCV 90.9 08/02/2022 1051   MCV 87 01/24/2018 1148   MCV 89.2 01/30/2013 1319   MCH 31.3 08/02/2022 1051   MCHC 34.4 08/02/2022 1051   RDW 14.1 08/02/2022 1051   RDW 14.3 01/24/2018 1148   RDW 13.4 01/30/2013 1319   LYMPHSABS 1.2 08/02/2022 1051   LYMPHSABS 1.1 01/30/2013 1319    MONOABS 0.5 08/02/2022 1051   MONOABS 0.3 01/30/2013 1319   EOSABS 0.1 08/02/2022 1051   EOSABS 0.1 01/30/2013 1319   BASOSABS 0.0 08/02/2022 1051   BASOSABS 0.0 01/30/2013 1319    BMET    Component Value Date/Time   NA 139 08/02/2022 1051   NA 139 01/24/2018 1148   NA 141 01/30/2013 1320   K 4.4 08/02/2022 1051  K 3.8 01/30/2013 1320   CL 104 08/02/2022 1051   CO2 31 08/02/2022 1051   CO2 29 01/30/2013 1320   GLUCOSE 83 08/02/2022 1051   GLUCOSE 143 (H) 01/30/2013 1320   BUN 17 08/02/2022 1051   BUN 11 01/24/2018 1148   BUN 5.9 (L) 01/30/2013 1320   CREATININE 0.75 08/02/2022 1051   CREATININE 0.7 01/30/2013 1320   CALCIUM 8.9 08/02/2022 1051   CALCIUM 9.0 01/30/2013 1320   GFRNONAA >60 08/02/2022 1051   GFRAA >60 12/17/2018 1532    BNP    Component Value Date/Time   BNP 293.6 (H) 04/14/2022 0242     Imaging:  CUP PACEART REMOTE DEVICE CHECK  Result Date: 02/25/2023 ILR summary report received. Battery status OK. Normal device function. No new symptom, tachy, brady, or pause episodes. No new AF episodes.  AF burden is 0% of the time.   Monthly summary reports and ROV/PRN ML, CVRS   lidocaine (XYLOCAINE) 1 % (with pres) injection 3 mL     Date Action Dose Route User   01/28/2023 1459 Given 3 mL Other Kathryne Hitch, MD      methylPREDNISolone acetate (DEPO-MEDROL) injection 40 mg     Date Action Dose Route User   01/28/2023 1459 Given 40 mg Intra-articular Kathryne Hitch, MD          Latest Ref Rng & Units 12/19/2018    2:20 PM 12/03/2017    1:03 PM  PFT Results  FVC-Pre L 1.35  2.03   FVC-Predicted Pre % 42  63   FVC-Post L 1.47  2.13   FVC-Predicted Post % 46  66   Pre FEV1/FVC % % 84  82   Post FEV1/FCV % % 87  85   FEV1-Pre L 1.13  1.67   FEV1-Predicted Pre % 46  68   FEV1-Post L 1.28  1.80   DLCO uncorrected ml/min/mmHg 14.76  16.06   DLCO UNC% % 74  66   DLCO corrected ml/min/mmHg 14.14    DLCO COR %Predicted % 71     DLVA Predicted % 126  93   TLC L 3.24  3.73   TLC % Predicted % 64  73   RV % Predicted % 78  77     No results found for: "NITRICOXIDE"      Assessment & Plan:     COPD Exacerbation Acute exacerbation of COPD with increased dyspnea, productive cough with grayish-white sputum, and wheezing. New O2 requirement. No fever or chills. Scant hemoptysis resolved. Negative for influenza and COVID-19 in office. Chest x-ray pending final read; no obvious consolidation on my interpretation. Low suspicion for pulmonary embolism due to consistent use of Eliquis. Discussed risks of untreated exacerbation, including respiratory failure and hospitalization. Benefits of treatment include symptom relief and prevention of complications. Immediate emergency medical attention necessary if symptoms worsen. - Administer augmentin twice daily for 7 days - Prednisone taper rx and depomedrol 80 mg inj x 1 in office  - Duoneb x 1 in office with improvement  - Send home with oxygen 3lpm for goal >88-90% - Order urgent nebulizer machine - Administer breathing treatments at least TID - Continue Stiolto - Administer Mucinex BID - Prescribe promethazine DM cough syrup, benzonatate for cough control  - Order labs to rule out fluid overload and check blood counts - Close follow up in one week and strict ED precautions   Acute respiratory failure with hypoxia  Suspected to be due to AECOPD, possibly  secondary to viral illness. See above. Will obtain labs to rule out HF/PH component or worsening anemia given chronic AC. Very low likelihood of PE given chronic AC.  - Urgent new start oxygen orders placed  Atrial fibrillation Regular rate and rhythm today. On chronic AC with Eliquis - compliant with therapy. Follow up with cardiology as scheduled.  Elevated right hemidiaphragm Chronic. Contributes to baseline symptoms of DOE.   TIA (Transient Ischemic Attack) TIA in December with residual balance issues. No new  symptoms. Discussed importance of monitoring for new symptoms such as sudden weakness, speech difficulties, or vision changes. Prompt treatment can reduce stroke risk. - Monitor for new symptoms - Continue current management  Sjogren's Syndrome Sjogren's syndrome with associated xerostomia. No new symptoms. - Continue current management  Follow-up - Follow up in one week - Call if no contact from oxygen company by 3:30 PM       Advised if symptoms do not improve or worsen, to please contact office for sooner follow up or seek emergency care.   I spent 60 minutes of dedicated to the care of this patient on the date of this encounter to include pre-visit review of records, face-to-face time with the patient discussing conditions above, post visit ordering of testing, clinical documentation with the electronic health record, making appropriate referrals as documented, and communicating necessary findings to members of the patients care team.  Noemi Chapel, NP 03/12/2023  Pt aware and understands NP's role.

## 2023-03-12 NOTE — Patient Instructions (Addendum)
Continue Albuterol inhaler 2 puffs or 3 mL neb every 6 hours as needed for shortness of breath or wheezing. Notify if symptoms persist despite rescue inhaler/neb use. Use nebs at least three times a day until symptoms improve Continue stiolto 2 puffs daily  -Prednisone taper. 4 tabs for 2 days, then 3 tabs for 2 days, 2 tabs for 2 days, then 1 tab for 2 days, then stop. Take in AM with food. Start tomorrow -Augmentin 1 tab Twice daily for 7 days. Take with food. Take a daily probiotic or eat yogurt while on antibiotics  -Benzonatate 1 capsule Three times a day for cough -Promethazine DM cough syrup 5 mL every 6 hours as needed for cough. May cause drowsiness. Do not drive after taking -Guaifenesin over the counter (mucinex or similar) 600 mg Twice daily for cough/congestion   Wear oxygen 3 lpm 24/7 until you are seen back. Monitor oxygen levels for goal >88-90%. If you cannot maintain this on the oxygen, go to the emergency department  Follow up in 7 days with Whitney [name] or Whitney [name]. Ok to double book [facility] Monday 11/11 if needed. If symptoms do not improve or worsen, please contact office for sooner follow up or seek emergency care.

## 2023-03-13 ENCOUNTER — Inpatient Hospital Stay (HOSPITAL_COMMUNITY)
Admission: EM | Admit: 2023-03-13 | Discharge: 2023-03-16 | DRG: 189 | Disposition: A | Discharge status: Home Health | Payer: Medicare PPO | Attending: Internal Medicine | Admitting: Internal Medicine

## 2023-03-13 ENCOUNTER — Emergency Department (HOSPITAL_COMMUNITY): Payer: Medicare PPO

## 2023-03-13 ENCOUNTER — Encounter (HOSPITAL_COMMUNITY): Payer: Self-pay | Admitting: *Deleted

## 2023-03-13 ENCOUNTER — Other Ambulatory Visit: Payer: Medicare PPO

## 2023-03-13 ENCOUNTER — Other Ambulatory Visit: Payer: Self-pay

## 2023-03-13 ENCOUNTER — Telehealth: Payer: Self-pay

## 2023-03-13 DIAGNOSIS — Z7901 Long term (current) use of anticoagulants: Secondary | ICD-10-CM

## 2023-03-13 DIAGNOSIS — Z8673 Personal history of transient ischemic attack (TIA), and cerebral infarction without residual deficits: Secondary | ICD-10-CM

## 2023-03-13 DIAGNOSIS — J441 Chronic obstructive pulmonary disease with (acute) exacerbation: Secondary | ICD-10-CM | POA: Diagnosis present

## 2023-03-13 DIAGNOSIS — K219 Gastro-esophageal reflux disease without esophagitis: Secondary | ICD-10-CM | POA: Diagnosis present

## 2023-03-13 DIAGNOSIS — I5031 Acute diastolic (congestive) heart failure: Secondary | ICD-10-CM | POA: Diagnosis not present

## 2023-03-13 DIAGNOSIS — E785 Hyperlipidemia, unspecified: Secondary | ICD-10-CM | POA: Diagnosis present

## 2023-03-13 DIAGNOSIS — R7989 Other specified abnormal findings of blood chemistry: Secondary | ICD-10-CM | POA: Diagnosis not present

## 2023-03-13 DIAGNOSIS — J9601 Acute respiratory failure with hypoxia: Secondary | ICD-10-CM

## 2023-03-13 DIAGNOSIS — Z7985 Long-term (current) use of injectable non-insulin antidiabetic drugs: Secondary | ICD-10-CM

## 2023-03-13 DIAGNOSIS — G4733 Obstructive sleep apnea (adult) (pediatric): Secondary | ICD-10-CM | POA: Diagnosis present

## 2023-03-13 DIAGNOSIS — E119 Type 2 diabetes mellitus without complications: Secondary | ICD-10-CM | POA: Diagnosis not present

## 2023-03-13 DIAGNOSIS — F32A Depression, unspecified: Secondary | ICD-10-CM | POA: Diagnosis present

## 2023-03-13 DIAGNOSIS — M797 Fibromyalgia: Secondary | ICD-10-CM | POA: Diagnosis present

## 2023-03-13 DIAGNOSIS — I2489 Other forms of acute ischemic heart disease: Secondary | ICD-10-CM | POA: Diagnosis present

## 2023-03-13 DIAGNOSIS — K7581 Nonalcoholic steatohepatitis (NASH): Secondary | ICD-10-CM | POA: Diagnosis present

## 2023-03-13 DIAGNOSIS — R0609 Other forms of dyspnea: Secondary | ICD-10-CM

## 2023-03-13 DIAGNOSIS — Z7951 Long term (current) use of inhaled steroids: Secondary | ICD-10-CM

## 2023-03-13 DIAGNOSIS — J9611 Chronic respiratory failure with hypoxia: Secondary | ICD-10-CM | POA: Diagnosis present

## 2023-03-13 DIAGNOSIS — Z825 Family history of asthma and other chronic lower respiratory diseases: Secondary | ICD-10-CM

## 2023-03-13 DIAGNOSIS — R0902 Hypoxemia: Secondary | ICD-10-CM

## 2023-03-13 DIAGNOSIS — Z886 Allergy status to analgesic agent status: Secondary | ICD-10-CM

## 2023-03-13 DIAGNOSIS — Z9884 Bariatric surgery status: Secondary | ICD-10-CM | POA: Diagnosis not present

## 2023-03-13 DIAGNOSIS — Z1152 Encounter for screening for COVID-19: Secondary | ICD-10-CM

## 2023-03-13 DIAGNOSIS — I340 Nonrheumatic mitral (valve) insufficiency: Secondary | ICD-10-CM | POA: Diagnosis not present

## 2023-03-13 DIAGNOSIS — Z820 Family history of epilepsy and other diseases of the nervous system: Secondary | ICD-10-CM

## 2023-03-13 DIAGNOSIS — I5033 Acute on chronic diastolic (congestive) heart failure: Secondary | ICD-10-CM | POA: Diagnosis not present

## 2023-03-13 DIAGNOSIS — R0602 Shortness of breath: Principal | ICD-10-CM

## 2023-03-13 DIAGNOSIS — I480 Paroxysmal atrial fibrillation: Secondary | ICD-10-CM | POA: Diagnosis not present

## 2023-03-13 DIAGNOSIS — Z79899 Other long term (current) drug therapy: Secondary | ICD-10-CM

## 2023-03-13 DIAGNOSIS — Z881 Allergy status to other antibiotic agents status: Secondary | ICD-10-CM

## 2023-03-13 DIAGNOSIS — Z952 Presence of prosthetic heart valve: Secondary | ICD-10-CM

## 2023-03-13 DIAGNOSIS — E8809 Other disorders of plasma-protein metabolism, not elsewhere classified: Secondary | ICD-10-CM | POA: Diagnosis present

## 2023-03-13 DIAGNOSIS — Z8261 Family history of arthritis: Secondary | ICD-10-CM

## 2023-03-13 DIAGNOSIS — K7460 Unspecified cirrhosis of liver: Secondary | ICD-10-CM | POA: Diagnosis present

## 2023-03-13 DIAGNOSIS — Z7952 Long term (current) use of systemic steroids: Secondary | ICD-10-CM

## 2023-03-13 DIAGNOSIS — K7469 Other cirrhosis of liver: Secondary | ICD-10-CM | POA: Diagnosis not present

## 2023-03-13 DIAGNOSIS — I350 Nonrheumatic aortic (valve) stenosis: Secondary | ICD-10-CM | POA: Diagnosis present

## 2023-03-13 DIAGNOSIS — I272 Pulmonary hypertension, unspecified: Secondary | ICD-10-CM | POA: Diagnosis present

## 2023-03-13 DIAGNOSIS — M3500 Sicca syndrome, unspecified: Secondary | ICD-10-CM | POA: Diagnosis not present

## 2023-03-13 DIAGNOSIS — J449 Chronic obstructive pulmonary disease, unspecified: Secondary | ICD-10-CM | POA: Diagnosis not present

## 2023-03-13 DIAGNOSIS — Z8249 Family history of ischemic heart disease and other diseases of the circulatory system: Secondary | ICD-10-CM

## 2023-03-13 LAB — CBC WITH DIFFERENTIAL/PLATELET
Abs Immature Granulocytes: 0.02 10*3/uL (ref 0.00–0.07)
Basophils Absolute: 0 10*3/uL (ref 0.0–0.1)
Basophils Absolute: 0 10*3/uL (ref 0.0–0.1)
Basophils Relative: 0.1 % (ref 0.0–3.0)
Basophils Relative: 0 %
Eosinophils Absolute: 0 10*3/uL (ref 0.0–0.7)
Eosinophils Absolute: 0 10*3/uL (ref 0.0–0.5)
Eosinophils Relative: 0.1 % (ref 0.0–5.0)
Eosinophils Relative: 0 %
HCT: 44.2 % (ref 36.0–46.0)
HCT: 44.1 % (ref 36.0–46.0)
Hemoglobin: 14.2 g/dL (ref 12.0–15.0)
Hemoglobin: 13.9 g/dL (ref 12.0–15.0)
Immature Granulocytes: 0 %
Lymphocytes Relative: 18.6 % (ref 12.0–46.0)
Lymphocytes Relative: 19 %
Lymphs Abs: 1.1 10*3/uL (ref 0.7–4.0)
Lymphs Abs: 1.2 10*3/uL (ref 0.7–4.0)
MCH: 30.5 pg (ref 26.0–34.0)
MCHC: 32.2 g/dL (ref 30.0–36.0)
MCHC: 31.5 g/dL (ref 30.0–36.0)
MCV: 95.7 fL (ref 78.0–100.0)
MCV: 96.9 fL (ref 80.0–100.0)
Monocytes Absolute: 0.5 10*3/uL (ref 0.1–1.0)
Monocytes Absolute: 0.5 10*3/uL (ref 0.1–1.0)
Monocytes Relative: 8.7 % (ref 3.0–12.0)
Monocytes Relative: 8 %
Neutro Abs: 4.2 10*3/uL (ref 1.4–7.7)
Neutro Abs: 4.6 10*3/uL (ref 1.7–7.7)
Neutrophils Relative %: 72.5 % (ref 43.0–77.0)
Neutrophils Relative %: 73 %
Platelets: 97 10*3/uL — ABNORMAL LOW (ref 150.0–400.0)
Platelets: 96 10*3/uL — ABNORMAL LOW (ref 150–400)
RBC: 4.62 Mil/uL (ref 3.87–5.11)
RBC: 4.55 MIL/uL (ref 3.87–5.11)
RDW: 14.9 % (ref 11.5–15.5)
RDW: 14.6 % (ref 11.5–15.5)
WBC: 5.7 10*3/uL (ref 4.0–10.5)
WBC: 6.3 10*3/uL (ref 4.0–10.5)
nRBC: 0 % (ref 0.0–0.2)

## 2023-03-13 LAB — I-STAT CHEM 8, ED
BUN: 24 mg/dL — ABNORMAL HIGH (ref 8–23)
Calcium, Ion: 1.13 mmol/L — ABNORMAL LOW (ref 1.15–1.40)
Chloride: 102 mmol/L (ref 98–111)
Creatinine, Ser: 1 mg/dL (ref 0.44–1.00)
Glucose, Bld: 148 mg/dL — ABNORMAL HIGH (ref 70–99)
HCT: 44 % (ref 36.0–46.0)
Hemoglobin: 15 g/dL (ref 12.0–15.0)
Potassium: 4.6 mmol/L (ref 3.5–5.1)
Sodium: 138 mmol/L (ref 135–145)
TCO2: 26 mmol/L (ref 22–32)

## 2023-03-13 LAB — BRAIN NATRIURETIC PEPTIDE
B Natriuretic Peptide: 694.6 pg/mL — ABNORMAL HIGH (ref 0.0–100.0)
Pro B Natriuretic peptide (BNP): 996 pg/mL — ABNORMAL HIGH (ref 0.0–100.0)

## 2023-03-13 LAB — BASIC METABOLIC PANEL
BUN: 19 mg/dL (ref 6–23)
CO2: 32 meq/L (ref 19–32)
Calcium: 8.8 mg/dL (ref 8.4–10.5)
Chloride: 101 meq/L (ref 96–112)
Creatinine, Ser: 0.9 mg/dL (ref 0.40–1.20)
GFR: 65.16 mL/min (ref >60.00)
Glucose, Bld: 192 mg/dL — ABNORMAL HIGH (ref 70–99)
Potassium: 4.7 meq/L (ref 3.5–5.1)
Sodium: 138 meq/L (ref 135–145)

## 2023-03-13 LAB — COMPREHENSIVE METABOLIC PANEL
ALT: 12 U/L (ref 0–44)
AST: 30 U/L (ref 15–41)
Albumin: 2.6 g/dL — ABNORMAL LOW (ref 3.5–5.0)
Alkaline Phosphatase: 65 U/L (ref 38–126)
Anion gap: 8 (ref 5–15)
BUN: 21 mg/dL (ref 8–23)
CO2: 27 mmol/L (ref 22–32)
Calcium: 8.8 mg/dL — ABNORMAL LOW (ref 8.9–10.3)
Chloride: 103 mmol/L (ref 98–111)
Creatinine, Ser: 1 mg/dL (ref 0.44–1.00)
GFR, Estimated: >60 mL/min (ref >60)
Glucose, Bld: 154 mg/dL — ABNORMAL HIGH (ref 70–99)
Potassium: 5 mmol/L (ref 3.5–5.1)
Sodium: 138 mmol/L (ref 135–145)
Total Bilirubin: 1.1 mg/dL (ref <1.2)
Total Protein: 6.2 g/dL — ABNORMAL LOW (ref 6.5–8.1)

## 2023-03-13 LAB — I-STAT CG4 LACTIC ACID, ED: Lactic Acid, Venous: 2.7 mmol/L (ref 0.5–1.9)

## 2023-03-13 LAB — TROPONIN I (HIGH SENSITIVITY): Troponin I (High Sensitivity): 57 ng/L — ABNORMAL HIGH (ref <18)

## 2023-03-13 LAB — PROTIME-INR
INR: 1.6 — ABNORMAL HIGH (ref 0.8–1.2)
Prothrombin Time: 19 s — ABNORMAL HIGH (ref 11.4–15.2)

## 2023-03-13 NOTE — ED Triage Notes (Signed)
The pt is c/o shortness of breath for several days   she was seen by her doctor and and she was sent here for admission  some type of lung infection

## 2023-03-13 NOTE — Telephone Encounter (Signed)
Telephone call to patient per Rhunette Croft, NP to get update if patient received oxygen and nebulizer that was an urgent order from yesterday's office visit encounter. Per patient, she has not received oxygen or the nebulizer machine and has not had any telephone call from a medical equipment company.  Patient also states she is doing okay.   Informed Katie patient has not received any equipment from a DME company.  Will check with one of the PCC's to get update on status of orders.

## 2023-03-13 NOTE — ED Provider Notes (Signed)
[place] EMERGENCY DEPARTMENT AT [facility] Provider Note   CSN: [id number] Arrival date & time: 03/13/23  1742     History  Chief Complaint  Patient presents with   infection  in lungs    Whitney [name] is a 67 y.o. female.  HPI   67 year old female presents emergency department with concern for new oxygen requirement.  Patient has not been feeling well with productive cough.  She is anticoagulated for history of atrial fibrillation.  Also history of aortic stenosis with TAVR.  Was seen as an outpatient by her primary doctor yesterday, was found to be hypoxic, placed on nasal cannula which is a new requirement, had a normal chest x-ray, was started on Augmentin and ordered blood work today.  She was called due to "an abnormal blood level" and was referred here for admission.  She has some chest heaviness but currently no chest pain.  She also complains of mild trace bilateral lower extremity edema.  Is otherwise been compliant with her medications, has only had 1 dose of the Augmentin.  Home Medications Prior to Admission medications   Medication Sig Start Date End Date Taking? Authorizing Provider  albuterol (PROVENTIL) (2.5 MG/3ML) 0.083% nebulizer solution Take 3 mLs (2.5 mg total) by nebulization every 6 (six) hours as needed for wheezing or shortness of breath. 03/12/23  Yes Cobb, Ruby Cola, NP  albuterol (VENTOLIN HFA) 108 (90 Base) MCG/ACT inhaler INHALE 2 PUFFS INTO THE LUNGS EVERY 4 HOURS AS NEEDED FOR WHEEZING OR SHORTNESS OF BREATH. 04/17/22  Yes Olalere, Adewale A, MD  amoxicillin-clavulanate (AUGMENTIN) 875-125 MG tablet Take 1 tablet by mouth 2 (two) times daily for 7 days. 03/12/23 03/19/23 Yes Cobb, Ruby Cola, NP  benzonatate (TESSALON) 200 MG capsule Take 1 capsule (200 mg total) by mouth 3 (three) times daily as needed for cough. 03/12/23  Yes Cobb, Ruby Cola, NP  ELIQUIS 5 MG TABS tablet TAKE 1 TABLET BY MOUTH TWICE A DAY 02/15/23  Yes Fenton, Clint R,  PA  ezetimibe (ZETIA) 10 MG tablet Take 10 mg by mouth at bedtime.  09/17/16  Yes [provider]  gabapentin (NEURONTIN) 100 MG capsule Take 1 capsule (100 mg total) by mouth 2 (two) times daily at 10 am and 4 pm AND 3 capsules (300 mg total) at bedtime. 01/31/23  Yes McCue, Shanda Bumps, NP  milk thistle 175 MG tablet Take 175 mg by mouth See admin instructions. Take one tablet by mouth twice a week   Yes [provider]  nadolol (CORGARD) 40 MG tablet TAKE 1 TABLET BY MOUTH EVERY DAY 11/26/22  Yes Corky Crafts, MD  OZEMPIC, 0.25 OR 0.5 MG/DOSE, 2 MG/3ML SOPN Inject 2 mg into the skin once a week. 09/21/21  Yes [provider]  predniSONE (DELTASONE) 10 MG tablet 4 tabs for 2 days, then 3 tabs for 2 days, 2 tabs for 2 days, then 1 tab for 2 days, then stop 03/12/23  Yes Cobb, Ruby Cola, NP  promethazine-dextromethorphan (PROMETHAZINE-DM) 6.25-15 MG/5ML syrup Take 5 mLs by mouth 4 (four) times daily as needed. Patient taking differently: Take 5 mLs by mouth 4 (four) times daily as needed for cough. 03/12/23  Yes Cobb, Ruby Cola, NP  Tiotropium Bromide-Olodaterol (STIOLTO RESPIMAT) 2.5-2.5 MCG/ACT AERS INHALE 2 PUFFS INTO THE LUNGS DAILY. INHALE 2 PUFFS BY MOUTH INTO THE LUNGS DAILY 03/07/23  Yes Leslye Peer, MD      Allergies    Doxycycline and Naproxen  Review of Systems   Review of Systems  Constitutional:  Positive for fatigue. Negative for fever.  Respiratory:  Positive for cough and shortness of breath.   Cardiovascular:  Positive for leg swelling. Negative for chest pain.  Gastrointestinal:  Negative for abdominal pain, diarrhea and vomiting.  Skin:  Negative for rash.  Neurological:  Negative for headaches.    Physical Exam Updated Vital Signs BP 115/64   Pulse 68   Temp 98.5 F (36.9 C)   Resp (!) 25   Ht 5\' 4"  (1.626 m)   Wt 104.6 kg   SpO2 99%   BMI 39.58 kg/m  Physical Exam Vitals and nursing note reviewed.  Constitutional:       Appearance: Normal appearance. She is ill-appearing.  HENT:     Head: Normocephalic.     Mouth/Throat:     Mouth: Mucous membranes are moist.  Cardiovascular:     Rate and Rhythm: Normal rate.  Pulmonary:     Effort: No respiratory distress.     Comments: Tachypneic, on nasal cannula Abdominal:     Palpations: Abdomen is soft.     Tenderness: There is no abdominal tenderness.  Musculoskeletal:     Comments: Trace pitting edema  Skin:    General: Skin is warm.  Neurological:     Mental Status: She is alert and oriented to person, place, and time. Mental status is at baseline.  Psychiatric:        Mood and Affect: Mood normal.     ED Results / Procedures / Treatments   Labs (all labs ordered are listed, but only abnormal results are displayed) Labs Reviewed  COMPREHENSIVE METABOLIC PANEL - Abnormal; Notable for the following components:      Result Value   Glucose, Bld 154 (*)    Calcium 8.8 (*)    Total Protein 6.2 (*)    Albumin 2.6 (*)    All other components within normal limits  CBC WITH DIFFERENTIAL/PLATELET - Abnormal; Notable for the following components:   Platelets 96 (*)    All other components within normal limits  PROTIME-INR - Abnormal; Notable for the following components:   Prothrombin Time 19.0 (*)    INR 1.6 (*)    All other components within normal limits  BRAIN NATRIURETIC PEPTIDE - Abnormal; Notable for the following components:   B Natriuretic Peptide 694.6 (*)    All other components within normal limits  I-STAT CG4 LACTIC ACID, ED - Abnormal; Notable for the following components:   Lactic Acid, Venous 2.7 (*)    All other components within normal limits  I-STAT CHEM 8, ED - Abnormal; Notable for the following components:   BUN 24 (*)    Glucose, Bld 148 (*)    Calcium, Ion 1.13 (*)    All other components within normal limits  TROPONIN I (HIGH SENSITIVITY) - Abnormal; Notable for the following components:   Troponin I (High Sensitivity) 57  (*)    All other components within normal limits  CULTURE, BLOOD (ROUTINE X 2)  CULTURE, BLOOD (ROUTINE X 2)  URINALYSIS, W/ REFLEX TO CULTURE (INFECTION SUSPECTED)  I-STAT CG4 LACTIC ACID, ED  TROPONIN I (HIGH SENSITIVITY)    EKG None  Radiology DG Chest 2 View  Result Date: 03/13/2023 CLINICAL DATA:  Shortness of breath EXAM: CHEST - 2 VIEW COMPARISON:  None Available. FINDINGS: The heart size and mediastinal contours are within normal limits. Both lungs are clear. The visualized skeletal structures are unremarkable. IMPRESSION: No  active cardiopulmonary disease. Electronically Signed   By: Charlett Nose M.D.   On: 03/13/2023 21:05   DG Chest 2 View  Result Date: 03/12/2023 CLINICAL DATA:  Cough and shortness of breath. EXAM: CHEST - 2 VIEW COMPARISON:  CT chest dated April 14, 2022. Chest x-ray dated April 13, 2022. FINDINGS: Stable cardiomediastinal silhouette with mild cardiomegaly and pulmonary artery enlargement. Prior CABG and AVR. New loop recorder noted. Normal pulmonary vascularity. Chronic mild elevation of the right hemidiaphragm with right basilar atelectasis/scarring and scarring at the right costophrenic angle. No pleural effusion or pneumothorax. No acute osseous abnormality. IMPRESSION: 1. No acute cardiopulmonary disease. Electronically Signed   By: Obie Dredge M.D.   On: 03/12/2023 12:02    Procedures .Critical Care  Performed by: Rozelle Logan, DO Authorized by: Rozelle Logan, DO   Critical care provider statement:    Critical care time (minutes):  30   Critical care time was exclusive of:  Separately billable procedures and treating other patients   Critical care was necessary to treat or prevent imminent or life-threatening deterioration of the following conditions:  Respiratory failure   Critical care was time spent personally by me on the following activities:  Development of treatment plan with patient or surrogate, discussions with consultants,  evaluation of patient's response to treatment, examination of patient, ordering and review of laboratory studies, ordering and review of radiographic studies, ordering and performing treatments and interventions, pulse oximetry, re-evaluation of patient's condition and review of old charts   I assumed direction of critical care for this patient from another provider in my specialty: no     Care discussed with: admitting provider       Medications Ordered in ED Medications - No data to display  ED Course/ Medical Decision Making/ A&P                                 Medical Decision Making  67 year old female presents emergency department with productive cough, new hypoxia requiring supplemental oxygen.  Noted to have oxygen levels dipping into the high 80s on room air, placed back on her 3 L nasal cannula.  She is ill-appearing but no acute respiratory distress, however at times tachypneic.  Trace pitting edema to lower extremities.  Blood work is reassuring, no significant leukocytosis or shift.  No significant metabolic abnormality, lactic is slightly elevated at 2.7.  Low suspicion for sepsis/sepsis socks at this time.  She is afebrile.  Chest x-ray does not show any focal consolidation.  She had a respiratory panel done yesterday that was negative.  However BNP has come back elevated over 600 with initial troponin of 57.  EKG does not show any acute ischemic changes.  Patient will require admission for hypoxia in the setting of bronchitis with early findings of demand ischemia/heart failure.  Patients evaluation and results requires admission for further treatment and care.  Spoke with hospitalist, reviewed patient's ED course and they accept admission.  Patient agrees with admission plan, offers no new complaints and is stable/unchanged at time of admit.        Final Clinical Impression(s) / ED Diagnoses Final diagnoses:  None    Rx / DC Orders ED Discharge Orders      None         Rozelle Logan, DO 03/13/23 2319

## 2023-03-13 NOTE — Progress Notes (Signed)
03/13/2023 Addendum: BNP significantly elevated at 996 pg/mL. Given this and new onset hypoxia, concern for acute CHF exacerbation. Low suspicion for PE given chronic AC; however, not entirely able to rule out. Recommended she go to the ED for further evaluation. Pt agreed.

## 2023-03-13 NOTE — ED Notes (Signed)
Second I-stat lactic canceled by Dr. Wilkie Aye.

## 2023-03-13 NOTE — ED Provider Triage Note (Signed)
Emergency Medicine Provider Triage Evaluation Note  Whitney [name] , a 67 y.o. female  was evaluated in triage.  Pt complains of SOB. Patient has had increased SOB for the past 4 days with low grade fever and productive cough. She was seen by her pulmonologist yesterday and was started on 3 L supplemental oxygen with some improvement of symptoms.   Review of Systems  Positive: SOB Negative: Fever   Physical Exam  BP 110/67   Pulse 80   Temp 98.5 F (36.9 C)   Resp 19   Ht 5\' 4"  (1.626 m)   Wt 104.6 kg   SpO2 91%   BMI 39.58 kg/m  Gen:   Awake, no distress   Resp:  Normal effort on 3L [place]:   Moves extremities without difficulty  Other:    Medical Decision Making  Medically screening exam initiated at 7:52 PM.  Appropriate orders placed.  Whitney [name] was informed that the remainder of the evaluation will be completed by another provider, this initial triage assessment does not replace that evaluation, and the importance of remaining in the ED until their evaluation is complete.   Maxwell Marion, PA-C 03/13/23 1953

## 2023-03-14 ENCOUNTER — Observation Stay (HOSPITAL_COMMUNITY): Payer: Medicare PPO

## 2023-03-14 ENCOUNTER — Encounter: Payer: Medicare PPO | Admitting: Physical Therapy

## 2023-03-14 DIAGNOSIS — I480 Paroxysmal atrial fibrillation: Secondary | ICD-10-CM

## 2023-03-14 DIAGNOSIS — E119 Type 2 diabetes mellitus without complications: Secondary | ICD-10-CM

## 2023-03-14 DIAGNOSIS — G4733 Obstructive sleep apnea (adult) (pediatric): Secondary | ICD-10-CM

## 2023-03-14 DIAGNOSIS — R7989 Other specified abnormal findings of blood chemistry: Secondary | ICD-10-CM

## 2023-03-14 DIAGNOSIS — Z952 Presence of prosthetic heart valve: Secondary | ICD-10-CM

## 2023-03-14 DIAGNOSIS — M3500 Sicca syndrome, unspecified: Secondary | ICD-10-CM

## 2023-03-14 DIAGNOSIS — I5031 Acute diastolic (congestive) heart failure: Secondary | ICD-10-CM | POA: Diagnosis not present

## 2023-03-14 DIAGNOSIS — K7469 Other cirrhosis of liver: Secondary | ICD-10-CM

## 2023-03-14 DIAGNOSIS — I340 Nonrheumatic mitral (valve) insufficiency: Secondary | ICD-10-CM | POA: Diagnosis not present

## 2023-03-14 DIAGNOSIS — J9601 Acute respiratory failure with hypoxia: Secondary | ICD-10-CM

## 2023-03-14 DIAGNOSIS — J449 Chronic obstructive pulmonary disease, unspecified: Secondary | ICD-10-CM

## 2023-03-14 DIAGNOSIS — J9611 Chronic respiratory failure with hypoxia: Secondary | ICD-10-CM | POA: Diagnosis present

## 2023-03-14 DIAGNOSIS — I350 Nonrheumatic aortic (valve) stenosis: Secondary | ICD-10-CM

## 2023-03-14 LAB — RESPIRATORY PANEL BY PCR

## 2023-03-14 LAB — CBG MONITORING, ED
Glucose-Capillary: 77 mg/dL (ref 70–99)
Glucose-Capillary: 59 mg/dL — ABNORMAL LOW (ref 70–99)
Glucose-Capillary: 165 mg/dL — ABNORMAL HIGH (ref 70–99)

## 2023-03-14 LAB — BASIC METABOLIC PANEL
Anion gap: 6 (ref 5–15)
BUN: 23 mg/dL (ref 8–23)
CO2: 29 mmol/L (ref 22–32)
Calcium: 8.7 mg/dL — ABNORMAL LOW (ref 8.9–10.3)
Chloride: 102 mmol/L (ref 98–111)
Creatinine, Ser: 1.05 mg/dL — ABNORMAL HIGH (ref 0.44–1.00)
GFR, Estimated: 58 mL/min — ABNORMAL LOW (ref >60)
Glucose, Bld: 107 mg/dL — ABNORMAL HIGH (ref 70–99)
Potassium: 4.9 mmol/L (ref 3.5–5.1)
Sodium: 137 mmol/L (ref 135–145)

## 2023-03-14 LAB — ECHOCARDIOGRAM COMPLETE
AR max vel: 1.3 cm2
AV Area VTI: 1.45 cm2
AV Area mean vel: 1.26 cm2
AV Mean grad: 23.7 mm[Hg]
AV Peak grad: 40.7 mm[Hg]
Ao pk vel: 3.19 m/s
Area-P 1/2: 1.67 cm2
Height: 64 in
S' Lateral: 1.9 cm
Weight: 3689.62 [oz_av]

## 2023-03-14 LAB — HEMOGLOBIN A1C
Hgb A1c MFr Bld: 5.3 % (ref 4.8–5.6)
Mean Plasma Glucose: 105.41 mg/dL

## 2023-03-14 LAB — CBC
HCT: 46.6 % — ABNORMAL HIGH (ref 36.0–46.0)
Hemoglobin: 14.2 g/dL (ref 12.0–15.0)
MCH: 29.9 pg (ref 26.0–34.0)
MCHC: 30.5 g/dL (ref 30.0–36.0)
MCV: 98.1 fL (ref 80.0–100.0)
Platelets: 103 10*3/uL — ABNORMAL LOW (ref 150–400)
RBC: 4.75 MIL/uL (ref 3.87–5.11)
RDW: 14.7 % (ref 11.5–15.5)
WBC: 6.7 10*3/uL (ref 4.0–10.5)
nRBC: 0 % (ref 0.0–0.2)

## 2023-03-14 LAB — GLUCOSE, CAPILLARY
Glucose-Capillary: 171 mg/dL — ABNORMAL HIGH (ref 70–99)
Glucose-Capillary: 179 mg/dL — ABNORMAL HIGH (ref 70–99)

## 2023-03-14 LAB — TSH: TSH: 1.843 u[IU]/mL (ref 0.350–4.500)

## 2023-03-14 LAB — TROPONIN I (HIGH SENSITIVITY)
Troponin I (High Sensitivity): 64 ng/L — ABNORMAL HIGH (ref <18)
Troponin I (High Sensitivity): 67 ng/L — ABNORMAL HIGH (ref <18)
Troponin I (High Sensitivity): 75 ng/L — ABNORMAL HIGH (ref <18)

## 2023-03-14 LAB — MRSA NEXT GEN BY PCR, NASAL: MRSA by PCR Next Gen: NOT DETECTED

## 2023-03-14 LAB — HIV ANTIBODY (ROUTINE TESTING W REFLEX): HIV Screen 4th Generation wRfx: NONREACTIVE

## 2023-03-14 LAB — I-STAT CG4 LACTIC ACID, ED: Lactic Acid, Venous: 0.8 mmol/L (ref 0.5–1.9)

## 2023-03-14 MED ORDER — PREDNISONE 20 MG PO TABS
30.0000 mg | ORAL_TABLET | Freq: Every day | ORAL | Status: DC
  Administered 2023-03-14: 30 mg via ORAL
  Filled 2023-03-14: qty 2

## 2023-03-14 MED ORDER — AMOXICILLIN-POT CLAVULANATE 875-125 MG PO TABS
1.0000 | ORAL_TABLET | Freq: Two times a day (BID) | ORAL | Status: DC
  Administered 2023-03-14 – 2023-03-15 (×3): 1 via ORAL
  Filled 2023-03-14 (×3): qty 1

## 2023-03-14 MED ORDER — ONDANSETRON HCL 4 MG PO TABS: 4.0000 mg | ORAL_TABLET | Freq: Four times a day (QID) | ORAL | Status: DC | PRN

## 2023-03-14 MED ORDER — METHYLPREDNISOLONE SODIUM SUCC 40 MG IJ SOLR
40.0000 mg | Freq: Once | INTRAMUSCULAR | Status: AC
  Administered 2023-03-14: 40 mg via INTRAVENOUS
  Filled 2023-03-14: qty 1

## 2023-03-14 MED ORDER — UMECLIDINIUM BROMIDE 62.5 MCG/ACT IN AEPB
1.0000 | INHALATION_SPRAY | Freq: Every day | RESPIRATORY_TRACT | Status: DC
  Administered 2023-03-14: 1 via RESPIRATORY_TRACT
  Filled 2023-03-14: qty 7

## 2023-03-14 MED ORDER — BENZONATATE 100 MG PO CAPS
200.0000 mg | ORAL_CAPSULE | Freq: Three times a day (TID) | ORAL | Status: DC | PRN
  Administered 2023-03-15: 200 mg via ORAL
  Filled 2023-03-14: qty 2

## 2023-03-14 MED ORDER — PREDNISONE 5 MG PO TABS
10.0000 mg | ORAL_TABLET | Freq: Every day | ORAL | Status: DC
Start: 2023-03-14 — End: 2023-03-14

## 2023-03-14 MED ORDER — ONDANSETRON HCL 4 MG/2ML IJ SOLN
4.0000 mg | Freq: Four times a day (QID) | INTRAMUSCULAR | Status: DC | PRN
Start: 2023-03-14 — End: 2023-03-16

## 2023-03-14 MED ORDER — INSULIN ASPART 100 UNIT/ML IJ SOLN
0.0000 [IU] | Freq: Every day | INTRAMUSCULAR | Status: DC
Start: 2023-03-14 — End: 2023-03-16

## 2023-03-14 MED ORDER — MOMETASONE FURO-FORMOTEROL FUM 200-5 MCG/ACT IN AERO
2.0000 | INHALATION_SPRAY | Freq: Two times a day (BID) | RESPIRATORY_TRACT | Status: DC
  Administered 2023-03-14 (×2): 2 via RESPIRATORY_TRACT
  Filled 2023-03-14: qty 8.8

## 2023-03-14 MED ORDER — METHYLPREDNISOLONE SODIUM SUCC 125 MG IJ SOLR
60.0000 mg | Freq: Every day | INTRAMUSCULAR | Status: DC
  Administered 2023-03-15 – 2023-03-16 (×2): 60 mg via INTRAVENOUS
  Filled 2023-03-14 (×2): qty 2

## 2023-03-14 MED ORDER — PREDNISONE 20 MG PO TABS: 10.0000 mg | ORAL_TABLET | Freq: Every day | ORAL | Status: DC

## 2023-03-14 MED ORDER — EZETIMIBE 10 MG PO TABS
10.0000 mg | ORAL_TABLET | Freq: Every day | ORAL | Status: DC
  Administered 2023-03-14 – 2023-03-15 (×2): 10 mg via ORAL
  Filled 2023-03-14 (×2): qty 1

## 2023-03-14 MED ORDER — APIXABAN 5 MG PO TABS
5.0000 mg | ORAL_TABLET | Freq: Two times a day (BID) | ORAL | Status: DC
  Administered 2023-03-14 – 2023-03-16 (×6): 5 mg via ORAL
  Filled 2023-03-14 (×6): qty 1

## 2023-03-14 MED ORDER — GABAPENTIN 300 MG PO CAPS
300.0000 mg | ORAL_CAPSULE | Freq: Every day | ORAL | Status: DC
  Administered 2023-03-14 – 2023-03-15 (×2): 300 mg via ORAL
  Filled 2023-03-14 (×2): qty 1

## 2023-03-14 MED ORDER — NADOLOL 40 MG PO TABS
40.0000 mg | ORAL_TABLET | Freq: Every day | ORAL | Status: DC
  Administered 2023-03-14 – 2023-03-16 (×3): 40 mg via ORAL
  Filled 2023-03-14 (×3): qty 1

## 2023-03-14 MED ORDER — FUROSEMIDE 10 MG/ML IJ SOLN: 20.0000 mg | Freq: Every day | INTRAMUSCULAR | Status: DC

## 2023-03-14 MED ORDER — FUROSEMIDE 10 MG/ML IJ SOLN
20.0000 mg | Freq: Once | INTRAMUSCULAR | Status: AC
  Administered 2023-03-14: 20 mg via INTRAVENOUS
  Filled 2023-03-14: qty 2

## 2023-03-14 MED ORDER — FUROSEMIDE 10 MG/ML IJ SOLN: 40.0000 mg | Freq: Two times a day (BID) | INTRAMUSCULAR | Status: DC

## 2023-03-14 MED ORDER — ENOXAPARIN SODIUM 40 MG/0.4ML IJ SOSY: 40.0000 mg | PREFILLED_SYRINGE | INTRAMUSCULAR | Status: DC

## 2023-03-14 MED ORDER — ACETAMINOPHEN 650 MG RE SUPP: 650.0000 mg | Freq: Four times a day (QID) | RECTAL | Status: DC | PRN

## 2023-03-14 MED ORDER — ACETAMINOPHEN 325 MG PO TABS: 650.0000 mg | ORAL_TABLET | Freq: Four times a day (QID) | ORAL | Status: DC | PRN

## 2023-03-14 MED ORDER — FUROSEMIDE 10 MG/ML IJ SOLN
20.0000 mg | Freq: Two times a day (BID) | INTRAMUSCULAR | Status: DC
  Administered 2023-03-14: 20 mg via INTRAVENOUS
  Filled 2023-03-14: qty 2

## 2023-03-14 MED ORDER — ALBUTEROL SULFATE (2.5 MG/3ML) 0.083% IN NEBU: 2.5000 mg | INHALATION_SOLUTION | RESPIRATORY_TRACT | Status: DC | PRN

## 2023-03-14 MED ORDER — INSULIN ASPART 100 UNIT/ML IJ SOLN
0.0000 [IU] | Freq: Three times a day (TID) | INTRAMUSCULAR | Status: DC
Start: 2023-03-14 — End: 2023-03-16
  Administered 2023-03-16: 2 [IU] via SUBCUTANEOUS

## 2023-03-14 MED ORDER — PREDNISONE 20 MG PO TABS
20.0000 mg | ORAL_TABLET | Freq: Every day | ORAL | Status: DC
Start: 2023-03-16 — End: 2023-03-14

## 2023-03-14 MED ORDER — GABAPENTIN 100 MG PO CAPS
100.0000 mg | ORAL_CAPSULE | Freq: Two times a day (BID) | ORAL | Status: DC
  Administered 2023-03-14 – 2023-03-16 (×6): 100 mg via ORAL
  Filled 2023-03-14 (×6): qty 1

## 2023-03-14 NOTE — ED Notes (Signed)
Pt given oj and crackers at this time along with meal tray.

## 2023-03-14 NOTE — Progress Notes (Signed)
OT evaluation:  Clinical Impression: Pt typically mod I for ADL and mobility (although does admit to furniture surfing) she has multiple levels in her home where she has to navigate a step to enter kitchen. She already has implemented some energy conservation for IADL. Today she is hypoxic with activity (see walking ambulation/saturation note for full details) dropping to 77% and requiring 6L O2 with activity. She is overall min A for min guard for UB ADL. OT will follow acutely to address decreased activity tolerance as well as continue energy conservation (AE) education. HHOT post-acute should focus on IADL in addition to ADL as she lives with her son who has been chronically hospitalized over the past year.     03/14/23 1300  OT Visit Information  Last OT Received On 03/14/23  Assistance Needed +1  PT/OT/SLP Co-Evaluation/Treatment Yes  Reason for Co-Treatment For patient/therapist safety;To address functional/ADL transfers  PT goals addressed during session Mobility/safety with mobility;Balance;Strengthening/ROM  OT goals addressed during session ADL's and self-care;Strengthening/ROM  History of Present Illness Whitney [name] is a 67 y.o. female seen in pulm office on 11/5 with four day h/o productive cough, orthopnea, peripheral edema new O2 needs and sent to ED 03/13/23 Dx with Acute hypoxic respiratory failure. PMH includes NASH cirrhosis, DM2, PAF on eliquis, sjogren's syndrome, severe AS s/p AVR, OSA on CPAP.  Precautions  Precautions Fall  Precaution Comments watch SpO2  Restrictions  Weight Bearing Restrictions No  Home Living  Family/patient expects to be discharged to: Private residence  Living Arrangements Children (lives with her son who is currently in [facility]; family can come check on her)  Available Help at Discharge Family;Available 24 hours/day  Type of Home House  Home Access Stairs to enter  Entrance Stairs-Number of Steps 3 steps; 50 ft; 3 steps  Entrance  Stairs-Rails None  Home Layout One level;Laundry or work area in basement (step into kitchen and livin room)  Development worker, community unit;Door  Horticulturist, commercial Yes  Home Equipment Grab bars - toilet;Grab bars - tub/shower;Shower seat  Prior Function  Prior Level of Function  Driving;Independent/Modified Independent  Mobility Comments furniture surfs  Pain Assessment  Pain Assessment Faces  Faces Pain Scale 0  Pain Intervention(s) Monitored during session;Repositioned  Cognition  Arousal Alert  Behavior During Therapy WFL for tasks assessed/performed  Overall Cognitive Status Within Functional Limits for tasks assessed  Communication  Communication No apparent difficulties  Upper Extremity Assessment  Upper Extremity Assessment Generalized weakness  Lower Extremity Assessment  Lower Extremity Assessment Defer to PT evaluation  Cervical / Trunk Assessment  Cervical / Trunk Assessment Normal  Vision- History  Baseline Vision/History 1 Wears glasses  Ability to See in Adequate Light 0 Adequate  Patient Visual Report No change from baseline  Vision- Assessment  Vision Assessment? No apparent visual deficits  ADL  Overall ADL's  Needs assistance/impaired  Eating/Feeding Modified independent;Sitting  Grooming Minimal assistance;Sitting  Grooming Details (indicate cue type and reason) 1 LOB while standing that required therapist intervention  Upper Body Bathing Set up  Upper Body Bathing Details (indicate cue type and reason) increased time  Lower Body Bathing Contact guard assist;Sitting/lateral leans  Upper Body Dressing  Set up;Sitting  Lower Body Dressing Set up;Sitting/lateral leans  Lower Body Dressing Details (indicate cue type and reason) for socks, will need more assist for items that require sit<>stand  Toilet Transfer Minimal assistance;+2 for safety/equipment (HHA)  Toilet Transfer Details (indicate cue type and reason) LOB x1  with therapist intervention  Toileting- Clothing Manipulation and Hygiene Minimal assistance  Functional mobility during ADLs Minimal assistance;+2 for safety/equipment  General ADL Comments needs energy conservation strategies  Bed Mobility  Overal bed mobility Needs Assistance  Bed Mobility Supine to Sit;Sit to Supine  Supine to sit Min assist  Sit to supine Min assist  General bed mobility comments use of rails, assist for trunk elevation  Transfers  Overall transfer level Needs assistance  Equipment used 2 person hand held assist  Transfers Sit to/from Stand  Sit to Stand Min assist;+2 safety/equipment;From elevated surface (ED stretcher)  General transfer comment multiple sit<>stands from ED stretcher with improved balance each time  Balance  Overall balance assessment Needs assistance  Sitting-balance support Feet unsupported;Single extremity supported  Sitting balance-Leahy Scale Good  Sitting balance - Comments able to perform lateral leans for don/doff of socks  Standing balance support Single extremity supported;Bilateral upper extremity supported  Standing balance-Leahy Scale Poor  Standing balance comment dependent on external assist, LOB x1 with static standing without support  General Comments  General comments (skin integrity, edema, etc.) 2 daughters and one grandaughter present throughout session.  OT - End of Session  Equipment Utilized During Treatment Gait belt;Oxygen (2-6L)  Activity Tolerance Patient tolerated treatment well  Patient left in bed;with call bell/phone within reach;with family/visitor present (ED stretcher)  Nurse Communication Mobility status;Other (comment) (SpO2 results)  OT Assessment  OT Recommendation/Assessment Patient needs continued OT Services  OT Visit Diagnosis Unsteadiness on feet (R26.81);Other abnormalities of gait and mobility (R26.89);Muscle weakness (generalized) (M62.81)  OT Problem List Decreased activity tolerance;Impaired  balance (sitting and/or standing);Decreased safety awareness;Cardiopulmonary status limiting activity  OT Plan  OT Frequency (ACUTE ONLY) Min 1X/week  OT Treatment/Interventions (ACUTE ONLY) Self-care/ADL training;Energy conservation;DME and/or AE instruction;Therapeutic activities;Patient/family education;Balance training  AM-PAC OT "6 Clicks" Daily Activity Outcome Measure (Version 2)  Help from another person eating meals? 4  Help from another person taking care of personal grooming? 3  Help from another person toileting, which includes using toliet, bedpan, or urinal? 3  Help from another person bathing (including washing, rinsing, drying)? 3  Help from another person to put on and taking off regular upper body clothing? 3  Help from another person to put on and taking off regular lower body clothing? 3  6 Click Score 19  Progressive Mobility  What is the highest level of mobility based on the progressive mobility assessment? Level 4 (Walks with assist in room) - Balance while marching in place and cannot step forward and back - Complete  Activity Ambulated with assistance in hallway  OT Recommendation  Recommendations for Other Services PT consult  Follow Up Recommendations Home health OT  Patient can return home with the following A little help with walking and/or transfers;A little help with bathing/dressing/bathroom;Assistance with cooking/housework;Help with stairs or ramp for entrance  Functional Status Assessent Patient has had a recent decline in their functional status and demonstrates the ability to make significant improvements in function in a reasonable and predictable amount of time.  OT Equipment None recommended by OT  Individuals Consulted  Consulted and Agree with Results and Recommendations Patient;Family member/caregiver  Family Member Consulted 2 daughters, 1 grandaughter  Acute Rehab OT Goals  Patient Stated Goal be independent  OT Goal Formulation With  patient/family  Time For Goal Achievement 03/28/23  Potential to Achieve Goals Good  OT Time Calculation  OT Start Time (ACUTE ONLY) 1117  OT Stop Time (ACUTE ONLY) 1149  OT Time  Calculation (min) 32 min  OT General Charges  $OT Visit 1 Visit  OT Evaluation  $OT Eval Moderate Complexity 1 Mod    Nyoka Cowden OTR/L Acute Rehabilitation Services Office: [phone]

## 2023-03-14 NOTE — Progress Notes (Signed)
Carelink Summary Report / Loop Recorder 

## 2023-03-14 NOTE — Progress Notes (Signed)
Scratch noted on left anterior mid forearm, claimed that it was scratched by her dog. No drainage noted.

## 2023-03-14 NOTE — Assessment & Plan Note (Signed)
CPAP per home settings.   

## 2023-03-14 NOTE — Progress Notes (Signed)
SATURATION QUALIFICATIONS: (This note is used to comply with regulatory documentation for home oxygen)  Patient Saturations on Room Air at Rest = 88%  Patient Saturations on Room Air while Ambulating = 77%  Patient Saturations on 6 Liters of oxygen while Ambulating = 92%  Please briefly explain why patient needs home oxygen:  Without supplemental O2, the patient in unable to maintain SpO2 at or above 90%. She requires supplemental oxygen at this time. With activity her SpO2 drops even further, and she required up to 6L with activity and cues for PLB.   Nyoka Cowden OTR/L Acute Rehabilitation Services Office: [phone]

## 2023-03-14 NOTE — ED Notes (Signed)
Unable to draw Troponin lab contacted for blood draw.

## 2023-03-14 NOTE — Assessment & Plan Note (Deleted)
?   PAH given h/o Sjogren's? Will see if PAH shows up on 2d echo.

## 2023-03-14 NOTE — Assessment & Plan Note (Addendum)
-   No history of smoking but carries diagnosis of COPD per pulmonology notes -Presented with productive cough and generalized malaise with low-grade temperature at home and new oxygen demand.  Suspicion for PE has been low in setting of chronic anticoagulation for A-fib - CXR unremarkable - RVP negative; negative for covid and flu - continue nebulizers, Augmentin, steroid; continued on home course of Augmentin at discharge and prednisone taper -Continue oxygen at home.  Ambulating well on 4 L with sats 89 to 92%

## 2023-03-14 NOTE — Assessment & Plan Note (Signed)
Cont eliquis and Corgard

## 2023-03-14 NOTE — Hospital Course (Addendum)
Ms. Sharer is a 67 yo female with PMH PAF on Eliquis, AS s/p TAVR, NASH cirrhosis, depression, DM II, fibromyalgia, HLD, OSA, Sjogren's syndrome who presented with generalized fatigue/malaise and some associated shortness of breath. She was recently seen by pulmonology for similar complaints on 03/12/2023.  She was reporting cough with gray/white sputum production and low-grade temperature, 99.4 degrees.  She was also endorsing difficulty with laying flat.  Weight had also increased since prior office visits.  She was also found to be newly hypoxic on room air, 85% and placed on 3 L oxygen.  She was prescribed course of Augmentin, prednisone taper and treated with DuoNebs. CXR obtained in the ER was relatively unremarkable as was prior CXR on 11/5 during pulmonology visit.  Troponins were elevated but relatively flat change. EKG showed no ST or T wave changes; small Q wave appreciated in lead V2.  She was treated with a dose of Lasix and echo was also ordered on admission. She was evaluated by cardiology with no recommendations for further ischemic workup.  Elevated troponins were considered due to demand in setting of respiratory issues. She was arranged for home oxygen and was ambulating on 4 L with adequate saturations around 89 to 92%.

## 2023-03-14 NOTE — Consult Note (Addendum)
Cardiology Consultation   Patient ID: [name] MRN: [id number]; DOB: 27-Feb-1956  Admit date: 03/13/2023 Date of Consult: 03/14/2023  PCP:  Daisy Floro, MD    [facility]Care Providers Cardiologist:  Olga Millers, MD     Patient Profile:   Whitney [name] is a 67 y.o. female with a hx of Whitney [name], chronic thrombocytopenia, gastric sleeve in 2015, aortic stenosis status post tissue AVR 2019, diabetes, hyperlipidemia, Sjogren's, paroxysmal atrial fibrillation, OSA, CVA s/p ILR, COPD, paralysis right hemidiaphragm who is being seen 03/14/2023 for the evaluation of elevated troponin at the request of Dr Latanya Maudlin.  History of Present Illness:   Whitney [name] is a 67 year old female with past medical history noted above.  She has previously been followed by Dr. Eldridge Dace.  Known history of aortic stenosis that progressed with symptoms of exertional fatigue and tiredness.  Echocardiogram 08/2017 with mean gradient of 41 mmHg and peak gradient of 80 mmHg with normal LV function.  She underwent tissue AVR 12/2017 and had brief postoperative atrial fibrillation.   She was admitted with stroke 04/2022 with MRI showing multiple areas of infarct concerning for cardioembolic etiology.  She was seen by EP, Dr. Graciela Husbands and a loop recorder was placed.  Shortly after was found to have atrial fibrillation and referred to the A-fib clinic.  Her aspirin and Plavix was stopped (this started in the setting of her CVA) placed on Eliquis 5 mg twice daily  Seen in the office on 06/2022 with notes indicating she was continuing to have intermittent shortness of breath as well as weight gain.  There was concern about portopulmonary hypertension.  Consideration given to CPX testing.   Last seen 07/2022 with Dr. Graciela Husbands.  With her history of chronic thrombocytopenia in the context of NASH plans were to reach out to hematology for further recommendations regarding ongoing anticoagulation.  She was seen in the pulmonology  office on 11/5 visit acute visit with complaints of cough, dyspnea, orthopnea.  Temp of 99.4 flu and COVID negative.  She was started on Augmentin, prednisone taper, Mucinex with presumed COPD exacerbation.  New oxygen requirement. BNP done in the office of 996, and patient was asked to go to the ED for further evaluation given concern for acute CHF exacerbation.   In the ED her labs showed sodium 138, potassium 5, creatinine 1, albumin 2.6, BNP 694, lactic acid 2.7>>0.8, WBC 6.23, hemoglobin 13.9, high-sensitivity troponin>>57>>64>>67>>75.  Chest x-ray negative for edema.  EKG shows sinus rhythm, PAC, 75 bpm.  She was admitted to internal medicine for further management.  Echocardiogram 11/7 shows LVEF of 65 to 70%, moderate LVH, mildly reduced RV, severely elevated pulmonary artery pressure of 68 mmHg, severe left atrial enlargement, mildly dilated right atrium, mild MR, no significant prosthetic aortic valve stenosis.   Cardiology now asked to evaluate  Past Medical History:  Diagnosis Date   Allergic rhinitis    Anemia    hx   Arthritis    Asthma    hx yrs ago   Cirrhosis (HCC) last albumin 3.3 done at [facility] 06-16-2014 (under care everywhere tab in epic)   Secondary to Fatty liver --  followed by hepatology at [facility] (dr Ardine Eng)    Depression    Diabetes mellitus type II    type 2 diet conrolled   Dyspnea    Fibromyalgia    GERD (gastroesophageal reflux disease)    Heart murmur    asymptomatic ---  1989 from rhuematic fever   History of  exercise stress test    05-05-2013---  negative bruce ETT given exercise workload,  no ischemia   History of hiatal hernia    History of kidney stones    History of rheumatic fever    1989   Hyperlipidemia    Leukocytopenia    Moderate aortic stenosis    AVA area 1.1cm2---  cardiologist --  dr Lannie Fields, 2014 in epic   NASH (nonalcoholic steatohepatitis)    OSA (obstructive sleep apnea)    was using CPAP before gastric sleeve 2015--  no uses  after wt loss   Pneumonia    hx   Sjogren's syndrome (HCC)    Thrombocytopenia (HCC)     Past Surgical History:  Procedure Laterality Date   AORTIC VALVE REPLACEMENT N/A 12/05/2017   Procedure: AORTIC VALVE REPLACEMENT (AVR) TISSUE VALVE INSPIRIS;  Surgeon: Alleen Borne, MD;  Location: [facility] OR;  Service: Open Heart Surgery;  Laterality: N/A;   COLONOSCOPY WITH ESOPHAGOGASTRODUODENOSCOPY (EGD)     CYSTOSCOPY WITH RETROGRADE PYELOGRAM, URETEROSCOPY AND STENT PLACEMENT Left 01/21/2015   Procedure: CYSTOSCOPY WITH LEFT  RETROGRADE PYELOGRAM, LEFT URETEROSCOPY AND STENT PLACEMENT;  Surgeon: Jerilee Field, MD;  Location: [facility];  Service: Urology;  Laterality: Left;   CYSTOSCOPY WITH RETROGRADE PYELOGRAM, URETEROSCOPY AND STENT PLACEMENT Bilateral 02/24/2015   Procedure: CYSTOSCOPY WITH RIGHT RETROGRADE PYELOGRAM, BLADDER BIOPSY FULGERATION LEFT URETEROSCOPY AND STENT REPLACEMENT;  Surgeon: Jerilee Field, MD;  Location: [facility];  Service: Urology;  Laterality: Bilateral;   EXPLORATORY LAPAROSCOPY W/  CONE BIOPSY'S LEFT AND RIGHT LOBE OF LIVER  11-04-2007   HOLMIUM LASER APPLICATION Left 02/24/2015   Procedure: HOLMIUM LASER LITHOTRIPSY;  Surgeon: Jerilee Field, MD;  Location: [facility];  Service: Urology;  Laterality: Left;   HYSTEROSCOPY WITH D & C N/A 12/11/2012   Procedure: DILATATION AND CURETTAGE /HYSTEROSCOPY;  Surgeon: Dorien Chihuahua. Richardson Dopp, MD;  Location: [facility];  Service: Gynecology;  Laterality: N/A;   INGUINAL HERNIA REPAIR Left 10/22/2016   Procedure: LEFT INGUINAL HERNIA REPAIR;  Surgeon: Emelia Loron, MD;  Location: [name] OR;  Service: General;  Laterality: Left;  TAP BLOCK   INSERTION OF MESH Left 10/22/2016   Procedure: INSERTION OF MESH;  Surgeon: Emelia Loron, MD;  Location: [facility] OR;  Service: General;  Laterality: Left;   LAPAROSCOPIC GASTRIC SLEEVE RESECTION  07-27-2013   LOOP RECORDER INSERTION N/A 04/16/2022    Procedure: LOOP RECORDER INSERTION;  Surgeon: [facility] Salvia, MD;  Location: [facility] INVASIVE CV LAB;  Service: Cardiovascular;  Laterality: N/A;   PUBOVAGINAL SLING  04-10-2001   SPARC   RIGHT/LEFT HEART CATH AND CORONARY ANGIOGRAPHY N/A 08/23/2017   Procedure: RIGHT/LEFT HEART CATH AND CORONARY ANGIOGRAPHY;  Surgeon: Corky Crafts, MD;  Location: [facility] INVASIVE CV LAB;  Service: Cardiovascular;  Laterality: N/A;   TEE WITHOUT CARDIOVERSION N/A 12/05/2017   Procedure: TRANSESOPHAGEAL ECHOCARDIOGRAM (TEE);  Surgeon: Alleen Borne, MD;  Location: [facility] OR;  Service: Open Heart Surgery;  Laterality: N/A;   TONSILLECTOMY  1975   TRANSTHORACIC ECHOCARDIOGRAM  06-04-2012  dr Eldridge Dace   mild LVH,  grade I diastolic dysfunction/  ef 60-65%/  moderate LAE/  mild MV calcifation without stenosis,  mild MR/  moderate AV stenosis,  cannot r/o bicupsid, area 1.1cm2/  mild dilated aortic root/  trivial TR     Inpatient Medications: Scheduled Meds:  amoxicillin-clavulanate  1 tablet Oral BID   apixaban  5 mg Oral BID   ezetimibe  10 mg  Oral QHS   furosemide  20 mg Intravenous BID   gabapentin  100 mg Oral BID   And   gabapentin  300 mg Oral QHS   insulin aspart  0-15 Units Subcutaneous TID WC   insulin aspart  0-5 Units Subcutaneous QHS   [START ON 03/15/2023] methylPREDNISolone (SOLU-MEDROL) injection  60 mg Intravenous Daily   mometasone-formoterol  2 puff Inhalation BID   nadolol  40 mg Oral Daily   umeclidinium bromide  1 puff Inhalation Daily   Continuous Infusions:  PRN Meds: acetaminophen **OR** acetaminophen, albuterol, benzonatate, ondansetron **OR** ondansetron (ZOFRAN) IV  Allergies:    Allergies  Allergen Reactions   Doxycycline Hives and Rash   Naproxen Rash and Hives    Social History:   Social History   Socioeconomic History   Marital status: Widowed    Spouse name: Married to [employer]   Number of children: Not on file   Years of education: Not on file   Highest  education level: Not on file  Occupational History   Occupation: principal of [employer]  Tobacco Use   Smoking status: Never   Smokeless tobacco: Never   Tobacco comments:    Never smoke 05/08/22  Vaping Use   Vaping status: Never Used  Substance and Sexual Activity   Alcohol use: Yes    Comment: rarely   Drug use: No   Sexual activity: Not on file  Other Topics Concern   Not on file  Social History Narrative   Not on file   Social Determinants of Health   Financial Resource Strain: Not on file  Food Insecurity: No Food Insecurity (07/02/2022)   Hunger Vital Sign    Worried About Running Out of Food in the Last Year: Never true    Ran Out of Food in the Last Year: Never true  Transportation Needs: No Transportation Needs (07/02/2022)   PRAPARE - Administrator, Civil Service (Medical): No    Lack of Transportation (Non-Medical): No  Physical Activity: Not on file  Stress: Not on file  Social Connections: Not on file  Intimate Partner Violence: Not At Risk (04/14/2022)   Humiliation, Afraid, Rape, and Kick questionnaire    Fear of Current or Ex-Partner: No    Emotionally Abused: No    Physically Abused: No    Sexually Abused: No    Family History:    Family History  Problem Relation Age of Onset   Alzheimer's disease Father    Hip fracture Father    Asthma Father    Heart disease Father    Heart attack Father    Hypertension Father    Rheum arthritis Mother    Heart disease Mother    Allergies Daughter    Asthma Paternal Grandmother    Asthma Daughter    Stroke Neg Hx      ROS:  Please see the history of present illness.   All other ROS reviewed and negative.     Physical Exam/Data:   Vitals:   03/14/23 1345 03/14/23 1415 03/14/23 1500 03/14/23 1600  BP: 109/72 112/71 120/79 115/71  Pulse: 80 81 82 84  Resp: (!) 25 18 20 20   Temp:      TempSrc:      SpO2: 94% 96% 94% 94%  Weight:      Height:        Intake/Output Summary (Last 24  hours) at 03/14/2023 1616 Last data filed at 03/14/2023 0417 Gross per 24 hour  Intake 20 ml  Output --  Net 20 ml      03/13/2023    6:53 PM 03/12/2023    9:08 AM 01/31/2023    8:25 AM  Last 3 Weights  Weight (lbs) 230 lb 9.6 oz 230 lb 9.6 oz 223 lb  Weight (kg) 104.6 kg 104.599 kg 101.152 kg     Body mass index is 39.58 kg/m.  General:  Well nourished, well developed, in no acute distress. Sitting up in bed on [place] HEENT: normal Neck: no JVD Vascular: No carotid bruits; Distal pulses 2+ bilaterally Cardiac:  normal S1, S2; RRR; no murmur  Lungs:  clear to auscultation bilaterally, no rhonchi or rales  Abd: soft, nontender, no hepatomegaly  Ext: no edema Musculoskeletal:  No deformities, BUE and BLE strength normal and equal Skin: warm and dry  Neuro:  CNs 2-12 intact, no focal abnormalities noted Psych:  Normal affect   EKG:  The EKG was personally reviewed and demonstrates:  sinus rhythm, PAC, 75 bpm   Relevant CV Studies:  Echo: 03/14/2023  IMPRESSIONS     1. Left ventricular ejection fraction, by estimation, is 65 to 70%. The  left ventricle has normal function. The left ventricle has no regional  wall motion abnormalities. There is moderate concentric left ventricular  hypertrophy. Left ventricular  diastolic function could not be evaluated.   2. Right ventricular systolic function is mildly reduced. The right  ventricular size is normal. There is severely elevated pulmonary artery  systolic pressure. The estimated right ventricular systolic pressure is  68.0 mmHg.   3. Left atrial size was severely dilated.   4. Right atrial size was mildly dilated.   5. Exuberant mitral annular calcification. The mitral valve is  degenerative. Mild mitral valve regurgitation. Mild mitral stenosis. The  mean mitral valve gradient is 4.5 mmHg with average heart rate of 77 bpm.  Severe mitral annular calcification.   6. The tricuspid valve is abnormal. Tricuspid valve regurgitation  is  moderate to severe.   7. Findings most consistent with prosthesis patient mismatch with mildly  elevated gradients that have increased slightly compared to prior exam. No  significant prosthetic aortic valve stenosis. The aortic valve has been  repaired/replaced. Aortic valve  regurgitation is trivial and appears central. There is a 21 mm Edwards  Inspiris Resilia valve present in the aortic position. Procedure Date:  12/05/17. Aortic valve area, by VTI measures 1.45 cm. Aortic valve mean  gradient measures 23.7 mmHg. Aortic valve   Vmax measures 3.19 m/s. Aortic valve acceleration time measures 98 msec.   8. The inferior vena cava is dilated in size with <50% respiratory  variability, suggesting right atrial pressure of 15 mmHg.   FINDINGS   Left Ventricle: Left ventricular ejection fraction, by estimation, is 65  to 70%. The left ventricle has normal function. The left ventricle has no  regional wall motion abnormalities. The left ventricular internal cavity  size was normal in size. There is   moderate concentric left ventricular hypertrophy. Left ventricular  diastolic function could not be evaluated due to mitral annular  calcification (moderate or greater). Left ventricular diastolic function  could not be evaluated.   Right Ventricle: The right ventricular size is normal. No increase in  right ventricular wall thickness. Right ventricular systolic function is  mildly reduced. There is severely elevated pulmonary artery systolic  pressure. The tricuspid regurgitant  velocity is 3.64 m/s, and with an assumed right atrial pressure of 15  mmHg, the  estimated right ventricular systolic pressure is 68.0 mmHg.   Left Atrium: Left atrial size was severely dilated.   Right Atrium: Right atrial size was mildly dilated.   Pericardium: There is no evidence of pericardial effusion.   Mitral Valve: Exuberant mitral annular calcification. The mitral valve is  degenerative in  appearance. Severe mitral annular calcification. Mild  mitral valve regurgitation. Mild mitral valve stenosis. MV peak gradient,  9.4 mmHg. The mean mitral valve  gradient is 4.5 mmHg with average heart rate of 77 bpm.   Tricuspid Valve: The tricuspid valve is abnormal. Tricuspid valve  regurgitation is moderate to severe.   Aortic Valve: Findings most consistent with prosthesis patient mismatch  with mildly elevated gradients that have increased slightly compared to  prior exam. No significant prosthetic aortic valve stenosis. The aortic  valve has been repaired/replaced.  Aortic valve regurgitation is trivial. Aortic valve mean gradient measures  23.7 mmHg. Aortic valve peak gradient measures 40.7 mmHg. Aortic valve  area, by VTI measures 1.45 cm. There is a 21 mm Edwards Inspiris Resilia  valve present in the aortic  position. Procedure Date: 12/05/17.   Pulmonic Valve: The pulmonic valve was normal in structure. Pulmonic valve  regurgitation is mild.   Aorta: The aortic root is normal in size and structure. Ascending aorta  measurements are within normal limits for age when indexed to body surface  area.   Venous: The inferior vena cava is dilated in size with less than 50%  respiratory variability, suggesting right atrial pressure of 15 mmHg.   IAS/Shunts: The interatrial septum was not well visualized.    Laboratory Data:  High Sensitivity Troponin:   Recent Labs  Lab 03/13/23 2210 03/14/23 0017 03/14/23 0746 03/14/23 1518  TROPONINIHS 57* 64* 67* 75*     Chemistry Recent Labs  Lab 03/13/23 1428 03/13/23 1907 03/13/23 1912 03/14/23 0418  NA 138 138 138 137  K 4.7 5.0 4.6 4.9  CL 101 103 102 102  CO2 32 27  --  29  GLUCOSE 192* 154* 148* 107*  BUN 19 21 24* 23  CREATININE 0.90 1.00 1.00 1.05*  CALCIUM 8.8 8.8*  --  8.7*  GFRNONAA  --  >60  --  58*  ANIONGAP  --  8  --  6    Recent Labs  Lab 03/13/23 1907  PROT 6.2*  ALBUMIN 2.6*  AST 30  ALT 12   ALKPHOS 65  BILITOT 1.1   Lipids No results for input(s): "CHOL", "TRIG", "HDL", "LABVLDL", "LDLCALC", "CHOLHDL" in the last 168 hours.  Hematology Recent Labs  Lab 03/13/23 1428 03/13/23 1907 03/13/23 1912 03/14/23 0418  WBC 5.7 6.3  --  6.7  RBC 4.62 4.55  --  4.75  HGB 14.2 13.9 15.0 14.2  HCT 44.2 44.1 44.0 46.6*  MCV 95.7 96.9  --  98.1  MCH  --  30.5  --  29.9  MCHC 32.2 31.5  --  30.5  RDW 14.9 14.6  --  14.7  PLT 97.0* 96*  --  103*   Thyroid  Recent Labs  Lab 03/14/23 0418  TSH 1.843    BNP Recent Labs  Lab 03/13/23 1428 03/13/23 2100  BNP  --  694.6*  PROBNP 996.0*  --     DDimer No results for input(s): "DDIMER" in the last 168 hours.   Radiology/Studies:  ECHOCARDIOGRAM COMPLETE  Result Date: 03/14/2023    ECHOCARDIOGRAM REPORT   Patient Name:   Whitney [name] Date of  Exam: 03/14/2023 Medical Rec #:  [id number]      Height:       64.0 in Accession #:    [id number]     Weight:       230.6 lb Date of Birth:  Nov 06, 1955       BSA:          2.078 m Patient Age:    67 years       BP:           119/100 mmHg Patient Gender: F              HR:           77 bpm. Exam Location:  Inpatient Procedure: 2D Echo, Color Doppler and Cardiac Doppler Indications:    I50.31 Acute diastolic (congestive) heart failure  History:        Patient has prior history of Echocardiogram examinations, most                 recent 04/15/2022. COPD, Arrythmias:Atrial Fibrillation; Risk                 Factors:Diabetes, Dyslipidemia and Sleep Apnea.                 Aortic Valve: 21 mm Edwards Inspiris Resilia valve is present in                 the aortic position. Procedure Date: 12/05/17.  Sonographer:    Irving Burton Senior RDCS Referring Phys: [phone] JARED M GARDNER IMPRESSIONS  1. Left ventricular ejection fraction, by estimation, is 65 to 70%. The left ventricle has normal function. The left ventricle has no regional wall motion abnormalities. There is moderate concentric left ventricular hypertrophy.  Left ventricular diastolic function could not be evaluated.  2. Right ventricular systolic function is mildly reduced. The right ventricular size is normal. There is severely elevated pulmonary artery systolic pressure. The estimated right ventricular systolic pressure is 68.0 mmHg.  3. Left atrial size was severely dilated.  4. Right atrial size was mildly dilated.  5. Exuberant mitral annular calcification. The mitral valve is degenerative. Mild mitral valve regurgitation. Mild mitral stenosis. The mean mitral valve gradient is 4.5 mmHg with average heart rate of 77 bpm. Severe mitral annular calcification.  6. The tricuspid valve is abnormal. Tricuspid valve regurgitation is moderate to severe.  7. Findings most consistent with prosthesis patient mismatch with mildly elevated gradients that have increased slightly compared to prior exam. No significant prosthetic aortic valve stenosis. The aortic valve has been repaired/replaced. Aortic valve regurgitation is trivial and appears central. There is a 21 mm Edwards Inspiris Resilia valve present in the aortic position. Procedure Date: 12/05/17. Aortic valve area, by VTI measures 1.45 cm. Aortic valve mean gradient measures 23.7 mmHg. Aortic valve  Vmax measures 3.19 m/s. Aortic valve acceleration time measures 98 msec.  8. The inferior vena cava is dilated in size with <50% respiratory variability, suggesting right atrial pressure of 15 mmHg. FINDINGS  Left Ventricle: Left ventricular ejection fraction, by estimation, is 65 to 70%. The left ventricle has normal function. The left ventricle has no regional wall motion abnormalities. The left ventricular internal cavity size was normal in size. There is  moderate concentric left ventricular hypertrophy. Left ventricular diastolic function could not be evaluated due to mitral annular calcification (moderate or greater). Left ventricular diastolic function could not be evaluated. Right Ventricle: The right ventricular  size is normal. No increase in right ventricular wall  thickness. Right ventricular systolic function is mildly reduced. There is severely elevated pulmonary artery systolic pressure. The tricuspid regurgitant velocity is 3.64 m/s, and with an assumed right atrial pressure of 15 mmHg, the estimated right ventricular systolic pressure is 68.0 mmHg. Left Atrium: Left atrial size was severely dilated. Right Atrium: Right atrial size was mildly dilated. Pericardium: There is no evidence of pericardial effusion. Mitral Valve: Exuberant mitral annular calcification. The mitral valve is degenerative in appearance. Severe mitral annular calcification. Mild mitral valve regurgitation. Mild mitral valve stenosis. MV peak gradient, 9.4 mmHg. The mean mitral valve gradient is 4.5 mmHg with average heart rate of 77 bpm. Tricuspid Valve: The tricuspid valve is abnormal. Tricuspid valve regurgitation is moderate to severe. Aortic Valve: Findings most consistent with prosthesis patient mismatch with mildly elevated gradients that have increased slightly compared to prior exam. No significant prosthetic aortic valve stenosis. The aortic valve has been repaired/replaced. Aortic valve regurgitation is trivial. Aortic valve mean gradient measures 23.7 mmHg. Aortic valve peak gradient measures 40.7 mmHg. Aortic valve area, by VTI measures 1.45 cm. There is a 21 mm Edwards Inspiris Resilia valve present in the aortic position. Procedure Date: 12/05/17. Pulmonic Valve: The pulmonic valve was normal in structure. Pulmonic valve regurgitation is mild. Aorta: The aortic root is normal in size and structure. Ascending aorta measurements are within normal limits for age when indexed to body surface area. Venous: The inferior vena cava is dilated in size with less than 50% respiratory variability, suggesting right atrial pressure of 15 mmHg. IAS/Shunts: The interatrial septum was not well visualized.  LEFT VENTRICLE PLAX 2D LVIDd:         3.00 cm    Diastology LVIDs:         1.90 cm   LV e' medial:    5.11 cm/s LV PW:         1.30 cm   LV E/e' medial:  25.6 LV IVS:        1.50 cm   LV e' lateral:   6.42 cm/s LVOT diam:     2.10 cm   LV E/e' lateral: 20.4 LV SV:         92 LV SV Index:   44 LVOT Area:     3.46 cm  RIGHT VENTRICLE RV S prime:     8.92 cm/s TAPSE (M-mode): 1.8 cm LEFT ATRIUM              Index        RIGHT ATRIUM           Index LA diam:        5.60 cm  2.69 cm/m   RA Area:     19.60 cm LA Vol (A2C):   115.0 ml 55.33 ml/m  RA Volume:   59.50 ml  28.63 ml/m LA Vol (A4C):   101.0 ml 48.60 ml/m LA Biplane Vol: 112.0 ml 53.89 ml/m  AORTIC VALVE AV Area (Vmax):    1.30 cm AV Area (Vmean):   1.26 cm AV Area (VTI):     1.45 cm AV Vmax:           318.98 cm/s AV Vmean:          248.694 cm/s AV VTI:            0.637 m AV Peak Grad:      40.7 mmHg AV Mean Grad:      23.7 mmHg LVOT Vmax:  120.00 cm/s LVOT Vmean:        90.300 cm/s LVOT VTI:          0.266 m LVOT/AV VTI ratio: 0.42  AORTA Ao Root diam: 3.00 cm Ao Asc diam:  3.80 cm MITRAL VALVE                TRICUSPID VALVE MV Area (PHT): 1.67 cm     TR Peak grad:   53.0 mmHg MV Peak grad:  9.4 mmHg     TR Vmax:        364.00 cm/s MV Mean grad:  4.5 mmHg MV Vmax:       1.53 m/s     SHUNTS MV Vmean:      103.0 cm/s   Systemic VTI:  0.27 m MV E velocity: 131.00 cm/s  Systemic Diam: 2.10 cm MV A velocity: 111.00 cm/s MV E/A ratio:  1.18 Weston Brass MD Electronically signed by Weston Brass MD Signature Date/Time: 03/14/2023/3:41:27 PM    Final    DG Chest 2 View  Result Date: 03/13/2023 CLINICAL DATA:  Shortness of breath EXAM: CHEST - 2 VIEW COMPARISON:  None Available. FINDINGS: The heart size and mediastinal contours are within normal limits. Both lungs are clear. The visualized skeletal structures are unremarkable. IMPRESSION: No active cardiopulmonary disease. Electronically Signed   By: Charlett Nose M.D.   On: 03/13/2023 21:05   DG Chest 2 View  Result Date:  03/12/2023 CLINICAL DATA:  Cough and shortness of breath. EXAM: CHEST - 2 VIEW COMPARISON:  CT chest dated April 14, 2022. Chest x-ray dated April 13, 2022. FINDINGS: Stable cardiomediastinal silhouette with mild cardiomegaly and pulmonary artery enlargement. Prior CABG and AVR. New loop recorder noted. Normal pulmonary vascularity. Chronic mild elevation of the right hemidiaphragm with right basilar atelectasis/scarring and scarring at the right costophrenic angle. No pleural effusion or pneumothorax. No acute osseous abnormality. IMPRESSION: 1. No acute cardiopulmonary disease. Electronically Signed   By: Obie Dredge M.D.   On: 03/12/2023 12:02     Assessment and Plan:   Whitney [name] is a 67 y.o. female with a hx of Whitney [name], chronic thrombocytopenia, gastric sleeve in 2015, aortic stenosis status post tissue AVR 2019, diabetes, hyperlipidemia, Sjogren's, paroxysmal atrial fibrillation, OSA, CVA s/p ILR, COPD who is being seen 03/14/2023 for the evaluation of elevated troponin at the request of Dr Latanya Maudlin.   Elevated troponin -- she does report atypical chest pain. hsTn with mildly elevated flat trend. EKG without ischemic changes -- as this time suspect demand ischemia in the setting of COPC exacerbation -- would not anticipate further cardiac work up  Acute hypoxic respiratory failure COPD URI -- seen in the office with pulmonary on 11/5 with complaints of productive cough, congestion -- started on augmentin, steriods and O2 (which is new for her) -- management per primary  HFpEF -- echo this admission with LVEF of 65-70%, moderate LVH, mildly reduced RV, severely elevated pulmonary artery pressure of 68 mmHg, severe left atrial enlargement, mildly dilated right atrium, mild MR, no significant prosthetic aortic valve stenosis.  -- BNP 694, CXR with no pulmonary edema -- she has been given IV lasix 20mg  x2 today, no reported urine output thus far -- will increase IV lasix to 40mg  BID  starting tomorrow and follow up UOP  Pulmonary HTN -- echo showed severely elevated pulmonary artery pressure of 68 mmHg  -- suspect 2/2 to valvular disease and COPD -- there was mention on CPX in prior office  notes  Paroxsymal atrial fibrillation -- currently in sinus rhythm -- continue Eliquis 5mg  BID  Aortic stenosis s/p AVR -- mildly elevated gradient on echo  For questions or updates, please contact [facility] Please consult [email or web address] for contact info under    Signed, Laverda Page, NP  03/14/2023 4:16 PM As above, patient seen and examined.  Briefly she is a 67 year old female with past medical history of prosthetic aortic valve, paroxysmal atrial fibrillation, prior CVA, paralyzed right hemidiaphragm, COPD, obstructive sleep apnea, Sjogren's, diabetes mellitus, hyperlipidemia, previous gastric sleeve, NASH and thrombocytopenia for evaluation of elevated troponin and question CHF.  Patient has chronic dyspnea on exertion.  Over the preceding 3 to 4 days she developed a productive cough of a grayish-white sputum.  She denies fevers or chills or hemoptysis.  She felt as though she was developing pneumonia.  She was seen by pulmonary and treated with antibiotics, steroids, Mucinex and there was also note of an elevated BNP at 996.  She was sent to the emergency room and admitted and cardiology is now asked to evaluate.  Note she does note increased dyspnea on exertion as well over the past 3 days.  There is also orthopnea.  She occasionally feels pain in the left upper chest that increases with certain movements but no other chest pain is noted.  Chest x-ray shows no infiltrates.  Troponins are 57, 64, 67 and 75.  BNP 694.6.  Hemoglobin 14.2 and white blood cell count 6.7.  Platelet count 103,000.  Electrocardiogram shows sinus rhythm with PAC, cannot rule out septal infarct.  Echocardiogram shows normal LV function, moderate left ventricular hypertrophy, mild RV dysfunction,  severe pulmonary hypertension, severe left atrial lodgment, mild right atrial enlargement, mild mitral digitation, mild mitral stenosis with mean gradient 4.5 mmHg, moderate to severe tricuspid regurgitation, status post aortic valve replacement with mean gradient 23.7 mmHg and trace aortic insufficiency.  1 elevated troponin-patient has vague chest pain which is longstanding but no other chest pain noted.  Electrocardiogram shows no clear ST changes.  Troponin elevation is minimal and not consistent with acute coronary syndrome.  I would not suggest further ischemia evaluation.  2 dyspnea-likely multifactorial.  Likely has upper respiratory infection and I agree with continued antibiotics, steroids and bronchodilators.  BNP is mildly elevated though she is not significantly volume overloaded on examination.  Will give Lasix 40 mg IV twice daily for 1-2 doses to see if her symptoms improve.  Follow renal function closely.  She also has a history of an elevated hemidiaphragm.  Also with history of COPD.  Her pulmonary hypertension is likely secondary to pulmonary venous hypertension from her history of valvular heart disease as well as COPD and elevated hemidiaphragm.  Can consider CPX as an outpatient.  3 paroxysmal atrial fibrillation-patient is in sinus rhythm.  Continue apixaban.  4 status post aortic valve replacement-continue SBE prophylaxis.  Mildly elevated mean gradient is felt likely secondary to patient prosthesis mismatch.  Olga Millers, MD

## 2023-03-14 NOTE — H&P (Signed)
History and Physical    Patient: Whitney [name]:[id number] DOB: 1956-04-06 DOA: 03/13/2023 DOS: the patient was seen and examined on 03/14/2023 PCP: Whitney Floro, MD  Patient coming from: Home  Chief Complaint:  Chief Complaint  Patient presents with   infection  in lungs   HPI: Whitney [name] is a 67 y.o. female with medical history significant of NASH cirrhosis, DM2, PAF on eliquis, sjogren's syndrome, severe AS s/p AVR, OSA on CPAP.  Pt is a never smoker followed for COPD, chronic cough.  Pt seen in pulm office on 11/5 with four day h/o productive cough, orthopnea, peripheral edema, Tm of 99.4.  Pt with new O2 requirement.  Started on COPD meds by pulm.  Pt in to ED today due to not feeling better.   Review of Systems: As mentioned in the history of present illness. All other systems reviewed and are negative. Past Medical History:  Diagnosis Date   Allergic rhinitis    Anemia    hx   Arthritis    Asthma    hx yrs ago   Cirrhosis (HCC) last albumin 3.3 done at [facility] 06-16-2014 (under care everywhere tab in epic)   Secondary to Fatty liver --  followed by hepatology at [facility] (dr Ardine Eng)    Depression    Diabetes mellitus type II    type 2 diet conrolled   Dyspnea    Fibromyalgia    GERD (gastroesophageal reflux disease)    Heart murmur    asymptomatic ---  1989 from rhuematic fever   History of exercise stress test    05-05-2013---  negative bruce ETT given exercise workload,  no ischemia   History of hiatal hernia    History of kidney stones    History of rheumatic fever    1989   Hyperlipidemia    Leukocytopenia    Moderate aortic stenosis    AVA area 1.1cm2---  cardiologist --  dr Lannie Fields, 2014 in epic   NASH (nonalcoholic steatohepatitis)    OSA (obstructive sleep apnea)    was using CPAP before gastric sleeve 2015--  no uses after wt loss   Pneumonia    hx   Sjogren's syndrome (HCC)    Thrombocytopenia (HCC)    Past Surgical History:   Procedure Laterality Date   AORTIC VALVE REPLACEMENT N/A 12/05/2017   Procedure: AORTIC VALVE REPLACEMENT (AVR) TISSUE VALVE INSPIRIS;  Surgeon: Alleen Borne, MD;  Location: [facility] OR;  Service: Open Heart Surgery;  Laterality: N/A;   COLONOSCOPY WITH ESOPHAGOGASTRODUODENOSCOPY (EGD)     CYSTOSCOPY WITH RETROGRADE PYELOGRAM, URETEROSCOPY AND STENT PLACEMENT Left 01/21/2015   Procedure: CYSTOSCOPY WITH LEFT  RETROGRADE PYELOGRAM, LEFT URETEROSCOPY AND STENT PLACEMENT;  Surgeon: Jerilee Field, MD;  Location: [facility];  Service: Urology;  Laterality: Left;   CYSTOSCOPY WITH RETROGRADE PYELOGRAM, URETEROSCOPY AND STENT PLACEMENT Bilateral 02/24/2015   Procedure: CYSTOSCOPY WITH RIGHT RETROGRADE PYELOGRAM, BLADDER BIOPSY FULGERATION LEFT URETEROSCOPY AND STENT REPLACEMENT;  Surgeon: Jerilee Field, MD;  Location: [facility];  Service: Urology;  Laterality: Bilateral;   EXPLORATORY LAPAROSCOPY W/  CONE BIOPSY'S LEFT AND RIGHT LOBE OF LIVER  11-04-2007   HOLMIUM LASER APPLICATION Left 02/24/2015   Procedure: HOLMIUM LASER LITHOTRIPSY;  Surgeon: Jerilee Field, MD;  Location[facility];  Service: Urology;  Laterality: Left;   HYSTEROSCOPY WITH D & C N/A 12/11/2012   Procedure: DILATATION AND CURETTAGE /HYSTEROSCOPY;  Surgeon: Dorien Chihuahua. Richardson Dopp, MD;  Location: [facility]  ORS;  Service: Gynecology;  Laterality: N/A;   INGUINAL HERNIA REPAIR Left 10/22/2016   Procedure: LEFT INGUINAL HERNIA REPAIR;  Surgeon: Emelia Loron, MD;  Location: [facility] OR;  Service: General;  Laterality: Left;  TAP BLOCK   INSERTION OF MESH Left 10/22/2016   Procedure: INSERTION OF MESH;  Surgeon: Emelia Loron, MD;  Location: [facility] OR;  Service: General;  Laterality: Left;   LAPAROSCOPIC GASTRIC SLEEVE RESECTION  07-27-2013   LOOP RECORDER INSERTION N/A 04/16/2022   Procedure: LOOP RECORDER INSERTION;  Surgeon: [facility] Salvia, MD;  [facility]VAS[facility] CV LAB;  Service:  Cardiovascular;  Laterality: N/A;   PUBOVAGINAL SLING  04-10-2001   SPARC   RIGHT/LEFT HEART CATH AND CORONARY ANGIOGRAPHY N/A 08/23/2017   Procedure: RIGHT/LEFT HEART CATH AND CORONARY ANGIOGRAPHY;  Surgeon: Corky Crafts, MD;  Location: [facility] INVASIVE CV LAB;  Service: Cardiovascular;  Laterality: N/A;   TEE WITHOUT CARDIOVERSION N/A 12/05/2017   Procedure: TRANSESOPHAGEAL ECHOCARDIOGRAM (TEE);  Surgeon: Alleen Borne, MD;  Location: [facility] OR;  Service: Open Heart Surgery;  Laterality: N/A;   TONSILLECTOMY  1975   TRANSTHORACIC ECHOCARDIOGRAM  06-04-2012  dr Eldridge Dace   mild LVH,  grade I diastolic dysfunction/  ef 60-65%/  moderate LAE/  mild MV calcifation without stenosis,  mild MR/  moderate AV stenosis,  cannot r/o bicupsid, area 1.1cm2/  mild dilated aortic root/  trivial TR   Social History:  reports that she has never smoked. She has never used smokeless tobacco. She reports current alcohol use. She reports that she does not use drugs.  Allergies  Allergen Reactions   Doxycycline Hives and Rash   Naproxen Rash and Hives    Family History  Problem Relation Age of Onset   Alzheimer's disease Father    Hip fracture Father    Asthma Father    Heart disease Father    Heart attack Father    Hypertension Father    Rheum arthritis Mother    Heart disease Mother    Allergies Daughter    Asthma Paternal Grandmother    Asthma Daughter    Stroke Neg Hx     Prior to Admission medications   Medication Sig Start Date End Date Taking? Authorizing Provider  albuterol (PROVENTIL) (2.5 MG/3ML) 0.083% nebulizer solution Take 3 mLs (2.5 mg total) by nebulization every 6 (six) hours as needed for wheezing or shortness of breath. 03/12/23  Yes Cobb, Ruby Cola, NP  albuterol (VENTOLIN HFA) 108 (90 Base) MCG/ACT inhaler INHALE 2 PUFFS INTO THE LUNGS EVERY 4 HOURS AS NEEDED FOR WHEEZING OR SHORTNESS OF BREATH. 04/17/22  Yes Olalere, Adewale A, MD  amoxicillin-clavulanate (AUGMENTIN) 875-125 MG  tablet Take 1 tablet by mouth 2 (two) times daily for 7 days. 03/12/23 03/19/23 Yes Cobb, Ruby Cola, NP  benzonatate (TESSALON) 200 MG capsule Take 1 capsule (200 mg total) by mouth 3 (three) times daily as needed for cough. 03/12/23  Yes Cobb, Ruby Cola, NP  ELIQUIS 5 MG TABS tablet TAKE 1 TABLET BY MOUTH TWICE A DAY 02/15/23  Yes Fenton, Clint R, PA  ezetimibe (ZETIA) 10 MG tablet Take 10 mg by mouth at bedtime.  09/17/16  Yes [provider]  gabapentin (NEURONTIN) 100 MG capsule Take 1 capsule (100 mg total) by mouth 2 (two) times daily at 10 am and 4 pm AND 3 capsules (300 mg total) at bedtime. 01/31/23  Yes McCue, Shanda Bumps, NP  milk thistle 175 MG tablet Take 175 mg by mouth See admin instructions.  Take one tablet by mouth twice a week   Yes [provider]  nadolol (CORGARD) 40 MG tablet TAKE 1 TABLET BY MOUTH EVERY DAY 11/26/22  Yes Corky Crafts, MD  OZEMPIC, 0.25 OR 0.5 MG/DOSE, 2 MG/3ML SOPN Inject 2 mg into the skin once a week. 09/21/21  Yes [provider]  predniSONE (DELTASONE) 10 MG tablet 4 tabs for 2 days, then 3 tabs for 2 days, 2 tabs for 2 days, then 1 tab for 2 days, then stop 03/12/23  Yes Cobb, Ruby Cola, NP  promethazine-dextromethorphan (PROMETHAZINE-DM) 6.25-15 MG/5ML syrup Take 5 mLs by mouth 4 (four) times daily as needed. Patient taking differently: Take 5 mLs by mouth 4 (four) times daily as needed for cough. 03/12/23  Yes Cobb, Ruby Cola, NP  Tiotropium Bromide-Olodaterol (STIOLTO RESPIMAT) 2.5-2.5 MCG/ACT AERS INHALE 2 PUFFS INTO THE LUNGS DAILY. INHALE 2 PUFFS BY MOUTH INTO THE LUNGS DAILY 03/07/23  Yes Leslye Peer, MD    Physical Exam: Vitals:   03/13/23 2330 03/14/23 0100 03/14/23 0130 03/14/23 0230  BP: 108/68 136/78 116/64   Pulse: 71 63 68 62  Resp: 17 19 (!) 21 16  Temp:      SpO2: 99% 97% 97% 97%  Weight:      Height:       Constitutional: NAD, calm, comfortable Respiratory: Wet sounding cough, few wheezes, No  accessory muscle use.  Cardiovascular: Regular rate and rhythm, no murmurs / rubs / gallops. 2+ BLE edema. 2+ pedal pulses. No carotid bruits.  Abdomen: no tenderness, no masses palpated. No hepatosplenomegaly. Bowel sounds positive. Neurologic: CN 2-12 grossly intact. Sensation intact, DTR normal. Strength 5/5 in all 4.  Psychiatric: Normal judgment and insight. Alert and oriented x 3. Normal mood.   Data Reviewed:    Labs on Admission: I have personally reviewed following labs and imaging studies  CBC: Recent Labs  Lab 03/13/23 1428 03/13/23 1907 03/13/23 1912  WBC 5.7 6.3  --   NEUTROABS 4.2 4.6  --   HGB 14.2 13.9 15.0  HCT 44.2 44.1 44.0  MCV 95.7 96.9  --   PLT 97.0* 96*  --    Basic Metabolic Panel: Recent Labs  Lab 03/13/23 1428 03/13/23 1907 03/13/23 1912  NA 138 138 138  K 4.7 5.0 4.6  CL 101 103 102  CO2 32 27  --   GLUCOSE 192* 154* 148*  BUN 19 21 24*  CREATININE 0.90 1.00 1.00  CALCIUM 8.8 8.8*  --    GFR: Estimated Creatinine Clearance: 62.6 mL/min (by C-G formula based on SCr of 1 mg/dL). Liver Function Tests: Recent Labs  Lab 03/13/23 1907  AST 30  ALT 12  ALKPHOS 65  BILITOT 1.1  PROT 6.2*  ALBUMIN 2.6*   No results for input(s): "LIPASE", "AMYLASE" in the last 168 hours. No results for input(s): "AMMONIA" in the last 168 hours. Coagulation Profile: Recent Labs  Lab 03/13/23 1907  INR 1.6*   Cardiac Enzymes: No results for input(s): "CKTOTAL", "CKMB", "CKMBINDEX", "TROPONINI" in the last 168 hours. BNP (last 3 results) Recent Labs    03/13/23 1428  PROBNP 996.0*   HbA1C: No results for input(s): "HGBA1C" in the last 72 hours. CBG: No results for input(s): "GLUCAP" in the last 168 hours. Lipid Profile: No results for input(s): "CHOL", "HDL", "LDLCALC", "TRIG", "CHOLHDL", "LDLDIRECT" in the last 72 hours. Thyroid Function Tests: No results for input(s): "TSH", "T4TOTAL", "FREET4", "T3FREE", "THYROIDAB" in the last 72  hours. Anemia Panel:  No results for input(s): "VITAMINB12", "FOLATE", "FERRITIN", "TIBC", "IRON", "RETICCTPCT" in the last 72 hours. Urine analysis:    Component Value Date/Time   COLORURINE YELLOW 04/13/2022 1651   APPEARANCEUR CLEAR 04/13/2022 1651   LABSPEC 1.024 04/13/2022 1651   PHURINE 5.0 04/13/2022 1651   GLUCOSEU NEGATIVE 04/13/2022 1651   HGBUR SMALL (A) 04/13/2022 1651   BILIRUBINUR NEGATIVE 04/13/2022 1651   KETONESUR NEGATIVE 04/13/2022 1651   PROTEINUR 100 (A) 04/13/2022 1651   UROBILINOGEN 1.0 02/15/2015 0837   NITRITE NEGATIVE 04/13/2022 1651   LEUKOCYTESUR NEGATIVE 04/13/2022 1651    Radiological Exams on Admission: DG Chest 2 View  Result Date: 03/13/2023 CLINICAL DATA:  Shortness of breath EXAM: CHEST - 2 VIEW COMPARISON:  None Available. FINDINGS: The heart size and mediastinal contours are within normal limits. Both lungs are clear. The visualized skeletal structures are unremarkable. IMPRESSION: No active cardiopulmonary disease. Electronically Signed   By: Charlett Nose M.D.   On: 03/13/2023 21:05   DG Chest 2 View  Result Date: 03/12/2023 CLINICAL DATA:  Cough and shortness of breath. EXAM: CHEST - 2 VIEW COMPARISON:  CT chest dated April 14, 2022. Chest x-ray dated April 13, 2022. FINDINGS: Stable cardiomediastinal silhouette with mild cardiomegaly and pulmonary artery enlargement. Prior CABG and AVR. New loop recorder noted. Normal pulmonary vascularity. Chronic mild elevation of the right hemidiaphragm with right basilar atelectasis/scarring and scarring at the right costophrenic angle. No pleural effusion or pneumothorax. No acute osseous abnormality. IMPRESSION: 1. No acute cardiopulmonary disease. Electronically Signed   By: Obie Dredge M.D.   On: 03/12/2023 12:02    EKG: Independently reviewed.   Assessment and Plan: * Acute hypoxic respiratory failure (HCC) AECOPD vs AECHF. H/o AVS s/p AVR.  Doesn't look like she carys a chronic CHF diagnosis  previously, however, BNP elevation + worsening peripheral edema over past few days + trop elevation certainly is suspicious. Adult wheeze protocol Continue steroid taper started by pulm for the moment Cont LABA, LAMA, INH steroid PRN SABA Given peripheral edema and above exam and lab findings: Trying 20mg  IV lasix x1 dose to see if this helps at all Strict intake and output 2d echo ordered Tele monitor Cirrhosis + COPD diagnosis (by pulm) in a non smoker = check an alpha-1-at  DM2 (diabetes mellitus, type 2) (HCC) Currently on ozempic at home. However may have control issues while here due to being started on steroids. Will put on SSI AC/HS as needed.  Paroxysmal atrial fibrillation (HCC) Cont eliquis and Corgard  Cirrhosis (HCC) Also on differential as to cause of her edema, albumin 2.6 today.  Unclear if hepatogenic vs cardiogenic edema.  Sjogren's syndrome (HCC) ? PAH given h/o Sjogren's? Will see if PAH shows up on 2d echo.  OSA (obstructive sleep apnea) CPAP per home settings      Advance Care Planning:   Code Status: Full Code  Consults: None  Family Communication: No family in room  Severity of Illness: The appropriate patient status for this patient is OBSERVATION. Observation status is judged to be reasonable and necessary in order to provide the required intensity of service to ensure the patient's safety. The patient's presenting symptoms, physical exam findings, and initial radiographic and laboratory data in the context of their medical condition is felt to place them at decreased risk for further clinical deterioration. Furthermore, it is anticipated that the patient will be medically stable for discharge from the hospital within 2 midnights of admission.   Author: Hillary Bow., DO  03/14/2023 3:26 AM  For on call review [email or web address].

## 2023-03-14 NOTE — Assessment & Plan Note (Addendum)
Currently on ozempic at home. However may have control issues while here due to being started on steroids. Will put on SSI AC/HS as needed.

## 2023-03-14 NOTE — Evaluation (Addendum)
Physical Therapy Evaluation Patient Details Name: Whitney [name] MRN: [id number] DOB: 1955/09/01 Today's Date: 03/14/2023  History of Present Illness  Whitney [name] is a 67 y.o. female seen in pulm office on 11/5 with four day h/o productive cough, orthopnea, peripheral edema new O2 needs and sent to ED 03/13/23. Dx with Acute hypoxic respiratory failure. PMH includes: NASH cirrhosis, DM2, PAF on eliquis, sjogren's syndrome, severe AS s/p AVR, and OSA on CPAP.   Clinical Impression  Pt in bed upon arrival of PT, agreeable to evaluation at this time. Prior to admission the pt was independent with mobility, reports diminished endurance and chronic fatigue make mobility challenging. Pt currently needing supervision to CGA for mobility, but minA with gait and up to 6L O2 due to SpO2 to low of 77% with ambulation. The pt was educated in PLB and energy conservation, will trial rollator for increased ambulation tolerance and seated rest. Will benefit from HHPT to maximize functional mobility and safety in home setting as pt has various changes in level/steps throughout her home.         If plan is discharge home, recommend the following: A little help with walking and/or transfers;A little help with bathing/dressing/bathroom;Assistance with cooking/housework;Assist for transportation;Help with stairs or ramp for entrance   Can travel by private vehicle        Equipment Recommendations  (RW vs rollator)  Recommendations for Other Services       Functional Status Assessment Patient has had a recent decline in their functional status and demonstrates the ability to make significant improvements in function in a reasonable and predictable amount of time.     Precautions / Restrictions Precautions Precautions: Fall Precaution Comments: watch SpO2 Restrictions Weight Bearing Restrictions: No      Mobility  Bed Mobility Overal bed mobility: Needs Assistance Bed Mobility: Supine to Sit, Sit to  Supine     Supine to sit: Supervision Sit to supine: Supervision   General bed mobility comments: increased time, effort, SpO2 stable on RA with initial transition to sitting    Transfers Overall transfer level: Needs assistance Equipment used: 1 person hand held assist Transfers: Sit to/from Stand Sit to Stand: Contact guard assist, Min assist           General transfer comment: CGA for safety, moment of minA to steady when standing at EOB    Ambulation/Gait Ambulation/Gait assistance: Min assist, +2 safety/equipment Gait Distance (Feet): 35 Feet ((20 + 15)) Assistive device: 2 person hand held assist Gait Pattern/deviations: Step-through pattern, Decreased stride length Gait velocity: decreased Gait velocity interpretation: <1.31 ft/sec, indicative of household ambulator   General Gait Details: short stride, limited endurance. standing rest halfway needing increased O2 an PLB to recover. SpO2 to low of 77-80% on 4L, increased to 6L     Balance Overall balance assessment: Needs assistance Sitting-balance support: Feet unsupported, Single extremity supported Sitting balance-Leahy Scale: Good Sitting balance - Comments: able to perform lateral leans for don/doff of socks   Standing balance support: Single extremity supported, Bilateral upper extremity supported Standing balance-Leahy Scale: Poor Standing balance comment: dependent on external assist, LOB x1 with static standing without support                             Pertinent Vitals/Pain Pain Assessment Pain Assessment: Faces Faces Pain Scale: No hurt Pain Intervention(s): Limited activity within patient's tolerance, Monitored during session    Home Living Family/patient expects to  be discharged to:: Private residence Living Arrangements: Children (lives with her son who is currently in [facility] hospital; family can come check on her) Available Help at Discharge: Family;Available 24 hours/day Type of  Home: House Home Access: Stairs to enter Entrance Stairs-Rails: None Entrance Stairs-Number of Steps: 3 steps; 50 ft; 3 steps   Home Layout: One level;Laundry or work area in basement (step into kitchen and livin room) Home Equipment: Grab bars - toilet;Grab bars - tub/shower;Shower seat      Prior Function Prior Level of Function : Driving;Independent/Modified Independent             Mobility Comments: furniture surfs, wants quad cane, intermittent use of DME       Extremity/Trunk Assessment   Upper Extremity Assessment Upper Extremity Assessment: Defer to OT evaluation    Lower Extremity Assessment Lower Extremity Assessment: Generalized weakness    Cervical / Trunk Assessment Cervical / Trunk Assessment: Normal  Communication   Communication Communication: No apparent difficulties  Cognition Arousal: Alert Behavior During Therapy: WFL for tasks assessed/performed Overall Cognitive Status: Within Functional Limits for tasks assessed                                          General Comments General comments (skin integrity, edema, etc.): SpO2 stable on RA initially at rest, dropped to 87% with transition to standing, stabilized initially with 2L O2 but then needed additional increase to 6L for ambulation.    Exercises     Assessment/Plan    PT Assessment Patient needs continued PT services  PT Problem List Decreased range of motion;Decreased strength;Decreased activity tolerance;Decreased balance;Decreased mobility;Decreased cognition       PT Treatment Interventions DME instruction;Gait training;Stair training;Therapeutic exercise;Functional mobility training;Therapeutic activities;Balance training    PT Goals (Current goals can be found in the Care Plan section)  Acute Rehab PT Goals Patient Stated Goal: return to independence PT Goal Formulation: With patient Time For Goal Achievement: 03/28/23 Potential to Achieve Goals: Good     Frequency Min 1X/week     Co-evaluation   Reason for Co-Treatment: For patient/therapist safety;To address functional/ADL transfers PT goals addressed during session: Mobility/safety with mobility;Balance;Strengthening/ROM OT goals addressed during session: ADL's and self-care;Strengthening/ROM       AM-PAC PT "6 Clicks" Mobility  Outcome Measure Help needed turning from your back to your side while in a flat bed without using bedrails?: A Little Help needed moving from lying on your back to sitting on the side of a flat bed without using bedrails?: A Little Help needed moving to and from a bed to a chair (including a wheelchair)?: A Little Help needed standing up from a chair using your arms (e.g., wheelchair or bedside chair)?: A Little Help needed to walk in hospital room?: A Little Help needed climbing 3-5 steps with a railing? : A Little 6 Click Score: 18    End of Session Equipment Utilized During Treatment: Gait belt;Oxygen Activity Tolerance: Patient tolerated treatment well Patient left: in bed;with call bell/phone within reach;with family/visitor present Nurse Communication: Mobility status PT Visit Diagnosis: Unsteadiness on feet (R26.81);Other abnormalities of gait and mobility (R26.89);Muscle weakness (generalized) (M62.81)    Time: 8295-6213 PT Time Calculation (min) (ACUTE ONLY): 35 min   Charges:   PT Evaluation $PT Eval Moderate Complexity: 1 Mod   PT General Charges $$ ACUTE PT VISIT: 1 Visit  Vickki Muff, PT, DPT   Acute Rehabilitation Department Office [phone] Secure Chat Communication Preferred  Ronnie Derby 03/14/2023, 3:20 PM

## 2023-03-14 NOTE — Progress Notes (Signed)
   03/14/23 2332  BiPAP/CPAP/SIPAP  $ Non-Invasive Home Ventilator  Initial  $ Face Mask Medium Yes  BiPAP/CPAP/SIPAP Pt Type Adult  BiPAP/CPAP/SIPAP DREAMSTATIOND  Mask Type Full face mask  Mask Size Medium  EPAP 6 cmH2O  PEEP 6 cmH20  Patient Home Equipment No  Auto Titrate No  Nasal massage performed Yes  CPAP/SIPAP surface wiped down Yes  Safety Check Completed by RT for Home Unit Yes, no issues noted

## 2023-03-14 NOTE — Assessment & Plan Note (Addendum)
-   Currently no chest pain, nausea/vomiting, abdominal pain/indigestion.  She was however able to endorse she had a very mild sensation of "soreness" over her left chest some this past week which may have also been due to coughing - trops peaked at 75; appears to be consistent with demand ischemia - overall EKG reassuring; small Q wave noted V2; repeat EKG if any change in symptoms - echo has normal EF 65-70% and moderate LVH with suggestion of high RVSP -Appreciate cardiology evaluation.  No further ischemic workup recommended inpatient - Possible outpatient stress testing

## 2023-03-14 NOTE — Assessment & Plan Note (Addendum)
-   Also on differential as to cause of her edema was hypoalbuminemia - continue diet - normal alpha 1 AT

## 2023-03-14 NOTE — Progress Notes (Addendum)
Progress Note    Whitney [name]   EXB:[id number]  DOB: 10/02/1955  DOA: 03/13/2023     0 PCP: Daisy Floro, MD  Initial CC: fatigue, cough  Hospital Course: Whitney [name] is a 67 yo female with PMH PAF on Eliquis, AS s/p TAVR, NASH cirrhosis, depression, DM II, fibromyalgia, HLD, OSA, Sjogren's syndrome who presented with generalized fatigue/malaise and some associated shortness of breath. She was recently seen by pulmonology for similar complaints on 03/12/2023.  She was reporting cough with gray/white sputum production and low-grade temperature, 99.4 degrees.  She was also endorsing difficulty with laying flat.  Weight had also increased since prior office visits.  She was also found to be newly hypoxic on room air, 85% and placed on 3 L oxygen.  She was prescribed course of Augmentin, prednisone taper and treated with DuoNebs. CXR obtained in the ER was relatively unremarkable as was prior CXR on 11/5 during pulmonology visit.  Troponins were elevated but relatively flat change. EKG showed no ST or T wave changes; small Q wave appreciated in lead V2.  She was treated with a dose of Lasix and echo was also ordered on admission.   Interval History:  Seen in ER this morning with son and family bedside. Still endorsing generalized feeling of fatigue/malaise.  Ongoing cough still noted as well. Denied any CP, N/V.   Assessment and Plan: * Acute hypoxic respiratory failure (HCC) - No history of smoking but carries diagnosis of COPD per pulmonology notes -Presented with productive cough and generalized malaise with low-grade temperature at home and new oxygen demand.  Suspicion for PE has been low in setting of chronic anticoagulation for A-fib - CXR unremarkable - will check RVP; negative for covid and flu - continue nebulizers, Augmentin, prednisone  Elevated troponin - Currently no chest pain, nausea/vomiting, abdominal pain/indigestion.  She was however able to endorse she had a very  mild sensation of "soreness" over her left chest some this past week which may have also been due to coughing - so far trop trend flat, will still trend further - overall EKG reassuring; small Q wave noted V2; repeat EKG if any change in symptoms - follow up echo  Cirrhosis (HCC) - Also on differential as to cause of her edema was hypoalbuminemia - continue diet - follow up a1-AT and echo  OSA (obstructive sleep apnea) CPAP per home settings  Paroxysmal atrial fibrillation (HCC) Cont eliquis and Corgard  DM2 (diabetes mellitus, type 2) (HCC) Currently on ozempic at home. However may have control issues while here due to being started on steroids. Will put on SSI AC/HS as needed.   Old records reviewed in assessment of this patient  Antimicrobials: Augmentin 11/7 >> current   DVT prophylaxis:   apixaban (ELIQUIS) tablet 5 mg   Code Status:   Code Status: Full Code  Mobility Assessment (Last 72 Hours)     Mobility Assessment   No documentation.           Barriers to discharge: none Disposition Plan:  Home Status is: Obs  Objective: Blood pressure (!) 119/100, pulse 79, temperature 97.7 F (36.5 C), temperature source Oral, resp. rate (!) 21, height 5\' 4"  (1.626 m), weight 104.6 kg, SpO2 98%.  Examination:  Physical Exam Constitutional:      Appearance: Normal appearance.  HENT:     Head: Normocephalic and atraumatic.     Mouth/Throat:     Mouth: Mucous membranes are moist.  Eyes:  Extraocular Movements: Extraocular movements intact.  Cardiovascular:     Rate and Rhythm: Normal rate and regular rhythm.  Pulmonary:     Effort: Pulmonary effort is normal. No respiratory distress.     Breath sounds: Rhonchi present. No wheezing.  Abdominal:     General: Bowel sounds are normal. There is no distension.     Palpations: Abdomen is soft.     Tenderness: There is no abdominal tenderness.  Musculoskeletal:        General: Normal range of motion.      Cervical back: Normal range of motion and neck supple.  Skin:    General: Skin is warm and dry.  Neurological:     General: No focal deficit present.     Mental Status: She is alert.  Psychiatric:        Mood and Affect: Mood normal.      Consultants:    Procedures:    Data Reviewed: Results for orders placed or performed during the hospital encounter of 03/13/23 (from the past 24 hour(s))  Comprehensive metabolic panel     Status: Abnormal   Collection Time: 03/13/23  7:07 PM  Result Value Ref Range   Sodium 138 135 - 145 mmol/L   Potassium 5.0 3.5 - 5.1 mmol/L   Chloride 103 98 - 111 mmol/L   CO2 27 22 - 32 mmol/L   Glucose, Bld 154 (H) 70 - 99 mg/dL   BUN 21 8 - 23 mg/dL   Creatinine, Ser 1.61 0.44 - 1.00 mg/dL   Calcium 8.8 (L) 8.9 - 10.3 mg/dL   Total Protein 6.2 (L) 6.5 - 8.1 g/dL   Albumin 2.6 (L) 3.5 - 5.0 g/dL   AST 30 15 - 41 U/L   ALT 12 0 - 44 U/L   Alkaline Phosphatase 65 38 - 126 U/L   Total Bilirubin 1.1 <1.2 mg/dL   GFR, Estimated >09 >60 mL/min   Anion gap 8 5 - 15  CBC with Differential     Status: Abnormal   Collection Time: 03/13/23  7:07 PM  Result Value Ref Range   WBC 6.3 4.0 - 10.5 K/uL   RBC 4.55 3.87 - 5.11 MIL/uL   Hemoglobin 13.9 12.0 - 15.0 g/dL   HCT 45.4 09.8 - 11.9 %   MCV 96.9 80.0 - 100.0 fL   MCH 30.5 26.0 - 34.0 pg   MCHC 31.5 30.0 - 36.0 g/dL   RDW 14.7 82.9 - 56.2 %   Platelets 96 (L) 150 - 400 K/uL   nRBC 0.0 0.0 - 0.2 %   Neutrophils Relative % 73 %   Neutro Abs 4.6 1.7 - 7.7 K/uL   Lymphocytes Relative 19 %   Lymphs Abs 1.2 0.7 - 4.0 K/uL   Monocytes Relative 8 %   Monocytes Absolute 0.5 0.1 - 1.0 K/uL   Eosinophils Relative 0 %   Eosinophils Absolute 0.0 0.0 - 0.5 K/uL   Basophils Relative 0 %   Basophils Absolute 0.0 0.0 - 0.1 K/uL   Immature Granulocytes 0 %   Abs Immature Granulocytes 0.02 0.00 - 0.07 K/uL  Protime-INR     Status: Abnormal   Collection Time: 03/13/23  7:07 PM  Result Value Ref Range    Prothrombin Time 19.0 (H) 11.4 - 15.2 seconds   INR 1.6 (H) 0.8 - 1.2  I-stat chem 8, ed     Status: Abnormal   Collection Time: 03/13/23  7:12 PM  Result Value Ref Range  Sodium 138 135 - 145 mmol/L   Potassium 4.6 3.5 - 5.1 mmol/L   Chloride 102 98 - 111 mmol/L   BUN 24 (H) 8 - 23 mg/dL   Creatinine, Ser 9.93 0.44 - 1.00 mg/dL   Glucose, Bld 716 (H) 70 - 99 mg/dL   Calcium, Ion 9.67 (L) 1.15 - 1.40 mmol/L   TCO2 26 22 - 32 mmol/L   Hemoglobin 15.0 12.0 - 15.0 g/dL   HCT 89.3 81.0 - 17.5 %  I-Stat Lactic Acid, ED     Status: Abnormal   Collection Time: 03/13/23  8:40 PM  Result Value Ref Range   Lactic Acid, Venous 2.7 (HH) 0.5 - 1.9 mmol/L   Comment NOTIFIED PHYSICIAN   Culture, blood (Routine x 2)     Status: None (Preliminary result)   Collection Time: 03/13/23  9:00 PM   Specimen: BLOOD RIGHT HAND  Result Value Ref Range   Specimen Description BLOOD RIGHT HAND    Special Requests      BOTTLES DRAWN AEROBIC AND ANAEROBIC Blood Culture results may not be optimal due to an excessive volume of blood received in culture bottles   Culture      NO GROWTH < 12 HOURS Performed at [facility], [address], [place], [place] [address]    Report Status PENDING   Brain natriuretic peptide     Status: Abnormal   Collection Time: 03/13/23  9:00 PM  Result Value Ref Range   B Natriuretic Peptide 694.6 (H) 0.0 - 100.0 pg/mL  Troponin I (High Sensitivity)     Status: Abnormal   Collection Time: 03/13/23 10:10 PM  Result Value Ref Range   Troponin I (High Sensitivity) 57 (H) <18 ng/L  Troponin I (High Sensitivity)     Status: Abnormal   Collection Time: 03/14/23 12:17 AM  Result Value Ref Range   Troponin I (High Sensitivity) 64 (H) <18 ng/L  I-Stat Lactic Acid, ED     Status: None   Collection Time: 03/14/23 12:22 AM  Result Value Ref Range   Lactic Acid, Venous 0.8 0.5 - 1.9 mmol/L  CBC     Status: Abnormal   Collection Time: 03/14/23  4:18 AM  Result Value Ref Range    WBC 6.7 4.0 - 10.5 K/uL   RBC 4.75 3.87 - 5.11 MIL/uL   Hemoglobin 14.2 12.0 - 15.0 g/dL   HCT 52.7 (H) 78.2 - 42.3 %   MCV 98.1 80.0 - 100.0 fL   MCH 29.9 26.0 - 34.0 pg   MCHC 30.5 30.0 - 36.0 g/dL   RDW 53.6 14.4 - 31.5 %   Platelets 103 (L) 150 - 400 K/uL   nRBC 0.0 0.0 - 0.2 %  Basic metabolic panel     Status: Abnormal   Collection Time: 03/14/23  4:18 AM  Result Value Ref Range   Sodium 137 135 - 145 mmol/L   Potassium 4.9 3.5 - 5.1 mmol/L   Chloride 102 98 - 111 mmol/L   CO2 29 22 - 32 mmol/L   Glucose, Bld 107 (H) 70 - 99 mg/dL   BUN 23 8 - 23 mg/dL   Creatinine, Ser 4.00 (H) 0.44 - 1.00 mg/dL   Calcium 8.7 (L) 8.9 - 10.3 mg/dL   GFR, Estimated 58 (L) >60 mL/min   Anion gap 6 5 - 15  TSH     Status: None   Collection Time: 03/14/23  4:18 AM  Result Value Ref Range   TSH  1.843 0.350 - 4.500 uIU/mL  HIV Antibody (routine testing w rflx)     Status: None   Collection Time: 03/14/23  4:18 AM  Result Value Ref Range   HIV Screen 4th Generation wRfx Non Reactive Non Reactive  Hemoglobin A1c     Status: None   Collection Time: 03/14/23  4:18 AM  Result Value Ref Range   Hgb A1c MFr Bld 5.3 4.8 - 5.6 %   Mean Plasma Glucose 105.41 mg/dL  Troponin I (High Sensitivity)     Status: Abnormal   Collection Time: 03/14/23  7:46 AM  Result Value Ref Range   Troponin I (High Sensitivity) 67 (H) <18 ng/L  CBG monitoring, ED     Status: None   Collection Time: 03/14/23  8:21 AM  Result Value Ref Range   Glucose-Capillary 77 70 - 99 mg/dL    I have reviewed pertinent nursing notes, vitals, labs, and images as necessary. I have ordered labwork to follow up on as indicated.  I have reviewed the last notes from staff over past 24 hours. I have discussed patient's care plan and test results with nursing staff, CM/SW, and other staff as appropriate.  Time spent: Greater than 50% of the 55 minute visit was spent in counseling/coordination of care for the patient as laid out in the  A&P.   LOS: 0 days   Lewie Chamber, MD [facility] 03/14/2023, 12:38 PM

## 2023-03-14 NOTE — ED Notes (Signed)
ED TO INPATIENT HANDOFF REPORT  ED Nurse Name and Phone #: Delice Bison, RN  S Name/Age/Gender Whitney [name] 67 y.o. female Room/Bed: 037C/037C  Code Status   Code Status: Full Code  Home/SNF/Other Home Patient oriented to: self, place, time, and situation Is this baseline? Yes   Triage Complete: Triage complete  Chief Complaint Acute hypoxic respiratory failure (HCC) [J96.01]  Triage Note The pt is c/o shortness of breath for several days   she was seen by her doctor and and she was sent here for admission  some type of lung infection   Allergies Allergies  Allergen Reactions   Doxycycline Hives and Rash   Naproxen Rash and Hives    Level of Care/Admitting Diagnosis ED Disposition     ED Disposition  Admit   Condition  --   Comment  Hospital Area: [facility] [[id number]]  Level of Care: Telemetry Cardiac [[id number]]  May place patient in observation at [facility] or [facility] if equivalent level of care is available:: No  Covid Evaluation: Asymptomatic - no recent exposure (last 10 days) testing not required  Diagnosis: Acute hypoxic respiratory failure [facility]) [[id number]]  Admitting Physician: Hillary Bow [[id number]]  Attending Physician: Hillary Bow [[id number]]          B Medical/Surgery History Past Medical History:  Diagnosis Date   Allergic rhinitis    Anemia    hx   Arthritis    Asthma    hx yrs ago   Cirrhosis (HCC) last albumin 3.3 done at [facility] 06-16-2014 (under care everywhere tab in epic)   Secondary to Fatty liver --  followed by hepatology at [facility] (dr Ardine Eng)    Depression    Diabetes mellitus type II    type 2 diet conrolled   Dyspnea    Fibromyalgia    GERD (gastroesophageal reflux disease)    Heart murmur    asymptomatic ---  1989 from rhuematic fever   History of exercise stress test    05-05-2013---  negative bruce ETT given exercise workload,  no ischemia   History of hiatal hernia    History of kidney stones    History of  rheumatic fever    1989   Hyperlipidemia    Leukocytopenia    Moderate aortic stenosis    AVA area 1.1cm2---  cardiologist --  dr Lannie Fields, 2014 in epic   NASH (nonalcoholic steatohepatitis)    OSA (obstructive sleep apnea)    was using CPAP before gastric sleeve 2015--  no uses after wt loss   Pneumonia    hx   Sjogren's syndrome (HCC)    Thrombocytopenia (HCC)    Past Surgical History:  Procedure Laterality Date   AORTIC VALVE REPLACEMENT N/A 12/05/2017   Procedure: AORTIC VALVE REPLACEMENT (AVR) TISSUE VALVE INSPIRIS;  Surgeon: Alleen Borne, MD;  Location: [facility] OR;  Service: Open Heart Surgery;  Laterality: N/A;   COLONOSCOPY WITH ESOPHAGOGASTRODUODENOSCOPY (EGD)     CYSTOSCOPY WITH RETROGRADE PYELOGRAM, URETEROSCOPY AND STENT PLACEMENT Left 01/21/2015   Procedure: CYSTOSCOPY WITH LEFT  RETROGRADE PYELOGRAM, LEFT URETEROSCOPY AND STENT PLACEMENT;  Surgeon: Jerilee Field, MD;  Location[facility];  Service: Urology;  Laterality: Left;   CYSTOSCOPY WITH RETROGRADE PYELOGRAM, URETEROSCOPY AND STENT PLACEMENT Bilateral 02/24/2015   Procedure: CYSTOSCOPY WITH RIGHT RETROGRADE PYELOGRAM, BLADDER BIOPSY FULGERATION LEFT URETEROSCOPY AND STENT REPLACEMENT;  Surgeon: Jerilee Field, MD;  Location: [facility];  Service: Urology;  Laterality: Bilateral;  EXPLORATORY LAPAROSCOPY W/  CONE BIOPSY'S LEFT AND RIGHT LOBE OF LIVER  11-04-2007   HOLMIUM LASER APPLICATION Left 02/24/2015   Procedure: HOLMIUM LASER LITHOTRIPSY;  Surgeon: Jerilee Field, MD;  Location: [facility];  Service: Urology;  Laterality: Left;   HYSTEROSCOPY WITH D & C N/A 12/11/2012   Procedure: DILATATION AND CURETTAGE /HYSTEROSCOPY;  Surgeon: Dorien Chihuahua. Richardson Dopp, MD;  Location: [facility];  Service: Gynecology;  Laterality: N/A;   INGUINAL HERNIA REPAIR Left 10/22/2016   Procedure: LEFT INGUINAL HERNIA REPAIR;  Surgeon: Emelia Loron, MD;  Location: [facility] OR;  Service:  General;  Laterality: Left;  TAP BLOCK   INSERTION OF MESH Left 10/22/2016   Procedure: INSERTION OF MESH;  Surgeon: Emelia Loron, MD;  Location: [facility] OR;  Service: General;  Laterality: Left;   LAPAROSCOPIC GASTRIC SLEEVE RESECTION  07-27-2013   LOOP RECORDER INSERTION N/A 04/16/2022   Procedure: LOOP RECORDER INSERTION;  Surgeon: [facility] Salvia, MD;  Location: [facility] INVASIVE CV LAB;  Service: Cardiovascular;  Laterality: N/A;   PUBOVAGINAL SLING  04-10-2001   SPARC   RIGHT/LEFT HEART CATH AND CORONARY ANGIOGRAPHY N/A 08/23/2017   Procedure: RIGHT/LEFT HEART CATH AND CORONARY ANGIOGRAPHY;  Surgeon: Corky Crafts, MD;  Location: [facility] INVASIVE CV LAB;  Service: Cardiovascular;  Laterality: N/A;   TEE WITHOUT CARDIOVERSION N/A 12/05/2017   Procedure: TRANSESOPHAGEAL ECHOCARDIOGRAM (TEE);  Surgeon: Alleen Borne, MD;  Location: [facility] OR;  Service: Open Heart Surgery;  Laterality: N/A;   TONSILLECTOMY  1975   TRANSTHORACIC ECHOCARDIOGRAM  06-04-2012  dr Eldridge Dace   mild LVH,  grade I diastolic dysfunction/  ef 60-65%/  moderate LAE/  mild MV calcifation without stenosis,  mild MR/  moderate AV stenosis,  cannot r/o bicupsid, area 1.1cm2/  mild dilated aortic root/  trivial TR     A IV Location/Drains/Wounds Patient Lines/Drains/Airways Status     Active Line/Drains/Airways     Name Placement date Placement time Site Days   Peripheral IV 03/13/23 22 G Posterior;Right Hand 03/13/23  2104  Hand  1            Intake/Output Last 24 hours  Intake/Output Summary (Last 24 hours) at 03/14/2023 1531 Last data filed at 03/14/2023 0417 Gross per 24 hour  Intake 20 ml  Output --  Net 20 ml    Labs/Imaging Results for orders placed or performed during the hospital encounter of 03/13/23 (from the past 48 hour(s))  Comprehensive metabolic panel     Status: Abnormal   Collection Time: 03/13/23  7:07 PM  Result Value Ref Range   Sodium 138 135 - 145 mmol/L   Potassium 5.0 3.5 - 5.1 mmol/L    Chloride 103 98 - 111 mmol/L   CO2 27 22 - 32 mmol/L   Glucose, Bld 154 (H) 70 - 99 mg/dL    Comment: Glucose reference range applies only to samples taken after fasting for at least 8 hours.   BUN 21 8 - 23 mg/dL   Creatinine, Ser 1.61 0.44 - 1.00 mg/dL   Calcium 8.8 (L) 8.9 - 10.3 mg/dL   Total Protein 6.2 (L) 6.5 - 8.1 g/dL   Albumin 2.6 (L) 3.5 - 5.0 g/dL   AST 30 15 - 41 U/L   ALT 12 0 - 44 U/L   Alkaline Phosphatase 65 38 - 126 U/L   Total Bilirubin 1.1 <1.2 mg/dL   GFR, Estimated >09 >60 mL/min    Comment: (NOTE) Calculated using the CKD-EPI Creatinine Equation (2021)  Anion gap 8 5 - 15    Comment: Performed at [facility], [address], [place], [place] [address]  CBC with Differential     Status: Abnormal   Collection Time: 03/13/23  7:07 PM  Result Value Ref Range   WBC 6.3 4.0 - 10.5 K/uL   RBC 4.55 3.87 - 5.11 MIL/uL   Hemoglobin 13.9 12.0 - 15.0 g/dL   HCT 40.9 81.1 - 91.4 %   MCV 96.9 80.0 - 100.0 fL   MCH 30.5 26.0 - 34.0 pg   MCHC 31.5 30.0 - 36.0 g/dL   RDW 78.2 95.6 - 21.3 %   Platelets 96 (L) 150 - 400 K/uL    Comment: REPEATED TO VERIFY PLATELET COUNT CONFIRMED BY SMEAR    nRBC 0.0 0.0 - 0.2 %   Neutrophils Relative % 73 %   Neutro Abs 4.6 1.7 - 7.7 K/uL   Lymphocytes Relative 19 %   Lymphs Abs 1.2 0.7 - 4.0 K/uL   Monocytes Relative 8 %   Monocytes Absolute 0.5 0.1 - 1.0 K/uL   Eosinophils Relative 0 %   Eosinophils Absolute 0.0 0.0 - 0.5 K/uL   Basophils Relative 0 %   Basophils Absolute 0.0 0.0 - 0.1 K/uL   Immature Granulocytes 0 %   Abs Immature Granulocytes 0.02 0.00 - 0.07 K/uL    Comment: Performed at [facility], [address] [place], [place] [address]  Protime-INR     Status: Abnormal   Collection Time: 03/13/23  7:07 PM  Result Value Ref Range   Prothrombin Time 19.0 (H) 11.4 - 15.2 seconds   INR 1.6 (H) 0.8 - 1.2    Comment: (NOTE) INR goal varies based on device and disease states. Performed at [facility], [address] [place], [place] [address]   I-stat chem 8, ed     Status: Abnormal   Collection Time: 03/13/23  7:12 PM  Result Value Ref Range   Sodium 138 135 - 145 mmol/L   Potassium 4.6 3.5 - 5.1 mmol/L   Chloride 102 98 - 111 mmol/L   BUN 24 (H) 8 - 23 mg/dL   Creatinine, Ser 2.95 0.44 - 1.00 mg/dL   Glucose, Bld 284 (H) 70 - 99 mg/dL    Comment: Glucose reference range applies only to samples taken after fasting for at least 8 hours.   Calcium, Ion 1.13 (L) 1.15 - 1.40 mmol/L   TCO2 26 22 - 32 mmol/L   Hemoglobin 15.0 12.0 - 15.0 g/dL   HCT 13.2 44.0 - 10.2 %  I-Stat Lactic Acid, ED     Status: Abnormal   Collection Time: 03/13/23  8:40 PM  Result Value Ref Range   Lactic Acid, Venous 2.7 (HH) 0.5 - 1.9 mmol/L   Comment NOTIFIED PHYSICIAN   Culture, blood (Routine x 2)     Status: None (Preliminary result)   Collection Time: 03/13/23  9:00 PM   Specimen: BLOOD RIGHT HAND  Result Value Ref Range   Specimen Description BLOOD RIGHT HAND    Special Requests      BOTTLES DRAWN AEROBIC AND ANAEROBIC Blood Culture results may not be optimal due to an excessive volume of blood received in culture bottles   Culture      NO GROWTH < 12 HOURS Performed at [facility], [address], [place], [place] [address]    Report Status PENDING   Brain natriuretic peptide  Status: Abnormal   Collection Time: 03/13/23  9:00 PM  Result Value Ref Range   B Natriuretic Peptide 694.6 (H) 0.0 - 100.0 pg/mL    Comment: Performed at [facility], [address] [place], [place] [address]  Troponin I (High Sensitivity)     Status: Abnormal   Collection Time: 03/13/23 10:10 PM  Result Value Ref Range   Troponin I (High Sensitivity) 57 (H) <18 ng/L    Comment: (NOTE) Elevated high sensitivity troponin I (hsTnI) values and significant  changes across serial measurements may suggest ACS but many other  chronic and acute conditions are known to elevate hsTnI results.   Refer to the "Links" section for chest pain algorithms and additional  guidance. Performed at [facility], [address] [place], [place] [address]   Troponin I (High Sensitivity)     Status: Abnormal   Collection Time: 03/14/23 12:17 AM  Result Value Ref Range   Troponin I (High Sensitivity) 64 (H) <18 ng/L    Comment: (NOTE) Elevated high sensitivity troponin I (hsTnI) values and significant  changes across serial measurements may suggest ACS but many other  chronic and acute conditions are known to elevate hsTnI results.  Refer to the "Links" section for chest pain algorithms and additional  guidance. Performed at [facility], [address], [place], [place] [address]   I-Stat Lactic Acid, ED     Status: None   Collection Time: 03/14/23 12:22 AM  Result Value Ref Range   Lactic Acid, Venous 0.8 0.5 - 1.9 mmol/L  CBC     Status: Abnormal   Collection Time: 03/14/23  4:18 AM  Result Value Ref Range   WBC 6.7 4.0 - 10.5 K/uL   RBC 4.75 3.87 - 5.11 MIL/uL   Hemoglobin 14.2 12.0 - 15.0 g/dL   HCT 86.5 (H) 78.4 - 69.6 %   MCV 98.1 80.0 - 100.0 fL   MCH 29.9 26.0 - 34.0 pg   MCHC 30.5 30.0 - 36.0 g/dL   RDW 29.5 28.4 - 13.2 %   Platelets 103 (L) 150 - 400 K/uL    Comment: REPEATED TO VERIFY   nRBC 0.0 0.0 - 0.2 %    Comment: Performed at [facility], [address], [place], [place] [address]  Basic metabolic panel     Status: Abnormal   Collection Time: 03/14/23  4:18 AM  Result Value Ref Range   Sodium 137 135 - 145 mmol/L   Potassium 4.9 3.5 - 5.1 mmol/L   Chloride 102 98 - 111 mmol/L   CO2 29 22 - 32 mmol/L   Glucose, Bld 107 (H) 70 - 99 mg/dL    Comment: Glucose reference range applies only to samples taken after fasting for at least 8 hours.   BUN 23 8 - 23 mg/dL   Creatinine, Ser 2.72 (H) 0.44 - 1.00 mg/dL   Calcium 8.7 (L) 8.9 - 10.3 mg/dL   GFR, Estimated 58 (L) >60 mL/min    Comment: (NOTE) Calculated using the CKD-EPI Creatinine  Equation (2021)    Anion gap 6 5 - 15    Comment: Performed at [facility], [address], [place], [place] [address]  TSH     Status: None   Collection Time: 03/14/23  4:18 AM  Result Value Ref Range   TSH 1.843 0.350 - 4.500 uIU/mL    Comment: Performed by a 3rd Generation assay with a functional sensitivity of <=  0.01 uIU/mL. Performed at [facility], [address] [place], [place] [address]   HIV Antibody (routine testing w rflx)     Status: None   Collection Time: 03/14/23  4:18 AM  Result Value Ref Range   HIV Screen 4th Generation wRfx Non Reactive Non Reactive    Comment: Performed at [facility], [address], [place], [place] [address]  Hemoglobin A1c     Status: None   Collection Time: 03/14/23  4:18 AM  Result Value Ref Range   Hgb A1c MFr Bld 5.3 4.8 - 5.6 %    Comment: (NOTE) Pre diabetes:          5.7%-6.4%  Diabetes:              >6.4%  Glycemic control for   <7.0% adults with diabetes    Mean Plasma Glucose 105.41 mg/dL    Comment: Performed at [facility], [address] [place], [place] [address]  Troponin I (High Sensitivity)     Status: Abnormal   Collection Time: 03/14/23  7:46 AM  Result Value Ref Range   Troponin I (High Sensitivity) 67 (H) <18 ng/L    Comment: (NOTE) Elevated high sensitivity troponin I (hsTnI) values and significant  changes across serial measurements may suggest ACS but many other  chronic and acute conditions are known to elevate hsTnI results.  Refer to the "Links" section for chest pain algorithms and additional  guidance. Performed at [facility], [address] [place], [place] [address]   Respiratory (~20 pathogens) panel by PCR     Status: None   Collection Time: 03/14/23  7:48 AM   Specimen: Nasopharyngeal Swab; Respiratory  Result Value Ref Range   Adenovirus NOT DETECTED NOT DETECTED   Coronavirus 229E NOT DETECTED NOT DETECTED    Comment: (NOTE) The Coronavirus on the  Respiratory Panel, DOES NOT test for the novel  Coronavirus (2019 nCoV)    Coronavirus HKU1 NOT DETECTED NOT DETECTED   Coronavirus NL63 NOT DETECTED NOT DETECTED   Coronavirus OC43 NOT DETECTED NOT DETECTED   Metapneumovirus NOT DETECTED NOT DETECTED   Rhinovirus / Enterovirus NOT DETECTED NOT DETECTED   Influenza A NOT DETECTED NOT DETECTED   Influenza B NOT DETECTED NOT DETECTED   Parainfluenza Virus 1 NOT DETECTED NOT DETECTED   Parainfluenza Virus 2 NOT DETECTED NOT DETECTED   Parainfluenza Virus 3 NOT DETECTED NOT DETECTED   Parainfluenza Virus 4 NOT DETECTED NOT DETECTED   Respiratory Syncytial Virus NOT DETECTED NOT DETECTED   Bordetella pertussis NOT DETECTED NOT DETECTED   Bordetella Parapertussis NOT DETECTED NOT DETECTED   Chlamydophila pneumoniae NOT DETECTED NOT DETECTED   Mycoplasma pneumoniae NOT DETECTED NOT DETECTED    Comment: Performed at [facility], [address] [place], [place] [address]  CBG monitoring, ED     Status: None   Collection Time: 03/14/23  8:21 AM  Result Value Ref Range   Glucose-Capillary 77 70 - 99 mg/dL    Comment: Glucose reference range applies only to samples taken after fasting for at least 8 hours.  CBG monitoring, ED     Status: Abnormal   Collection Time: 03/14/23 12:42 PM  Result Value Ref Range   Glucose-Capillary 59 (L) 70 - 99 mg/dL    Comment: Glucose reference range applies only to samples taken after fasting for at least 8 hours.  CBG monitoring, ED     Status: Abnormal  Collection Time: 03/14/23  1:43 PM  Result Value Ref Range   Glucose-Capillary 165 (H) 70 - 99 mg/dL    Comment: Glucose reference range applies only to samples taken after fasting for at least 8 hours.   DG Chest 2 View  Result Date: 03/13/2023 CLINICAL DATA:  Shortness of breath EXAM: CHEST - 2 VIEW COMPARISON:  None Available. FINDINGS: The heart size and mediastinal contours are within normal limits. Both lungs are clear. The visualized  skeletal structures are unremarkable. IMPRESSION: No active cardiopulmonary disease. Electronically Signed   By: Charlett Nose M.D.   On: 03/13/2023 21:05    Pending Labs Unresulted Labs (From admission, onward)     Start     Ordered   03/14/23 0335  Alpha-1-antitrypsin  Once,   R        03/14/23 0334   03/13/23 1857  Culture, blood (Routine x 2)  BLOOD CULTURE X 2,   R (with STAT occurrences)      03/13/23 1857   03/13/23 1857  Urinalysis, w/ Reflex to Culture (Infection Suspected) -Urine, Clean Catch  Once,   URGENT       Question:  Specimen Source  Answer:  Urine, Clean Catch   03/13/23 1857            Vitals/Pain Today's Vitals   03/14/23 1315 03/14/23 1330 03/14/23 1345 03/14/23 1415  BP: (!) 101/59 100/68 109/72 112/71  Pulse: 79 80 80 81  Resp: (!) 25 (!) 23 (!) 25 18  Temp:      TempSrc:      SpO2: 94% 95% 94% 96%  Weight:      Height:      PainSc:        Isolation Precautions Droplet precaution  Medications Medications  acetaminophen (TYLENOL) tablet 650 mg (has no administration in time range)    Or  acetaminophen (TYLENOL) suppository 650 mg (has no administration in time range)  ondansetron (ZOFRAN) tablet 4 mg (has no administration in time range)    Or  ondansetron (ZOFRAN) injection 4 mg (has no administration in time range)  mometasone-formoterol (DULERA) 200-5 MCG/ACT inhaler 2 puff (2 puffs Inhalation Given 03/14/23 1121)  albuterol (PROVENTIL) (2.5 MG/3ML) 0.083% nebulizer solution 2.5 mg (has no administration in time range)  umeclidinium bromide (INCRUSE ELLIPTA) 62.5 MCG/ACT 1 puff (1 puff Inhalation Given 03/14/23 1123)  amoxicillin-clavulanate (AUGMENTIN) 875-125 MG per tablet 1 tablet (1 tablet Oral Given 03/14/23 1123)  apixaban (ELIQUIS) tablet 5 mg (5 mg Oral Given 03/14/23 1122)  nadolol (CORGARD) tablet 40 mg (40 mg Oral Given 03/14/23 1123)  benzonatate (TESSALON) capsule 200 mg (has no administration in time range)  gabapentin (NEURONTIN)  capsule 100 mg (100 mg Oral Given 03/14/23 1515)    And  gabapentin (NEURONTIN) capsule 300 mg (has no administration in time range)  ezetimibe (ZETIA) tablet 10 mg (has no administration in time range)  insulin aspart (novoLOG) injection 0-15 Units ( Subcutaneous Not Given 03/14/23 1249)  insulin aspart (novoLOG) injection 0-5 Units (has no administration in time range)  predniSONE (DELTASONE) tablet 30 mg (30 mg Oral Given 03/14/23 1122)    Followed by  predniSONE (DELTASONE) tablet 20 mg (has no administration in time range)    Followed by  predniSONE (DELTASONE) tablet 10 mg (has no administration in time range)  furosemide (LASIX) injection 20 mg (20 mg Intravenous Given 03/14/23 0412)    Mobility walks     Focused Assessments Cardiac Assessment Handoff:    Lab  Results  Component Value Date   TROPONINI <0.30 06/14/2013   Lab Results  Component Value Date   DDIMER 1.73 (H) 04/13/2022   Does the Patient currently have chest pain? No    R Recommendations: See Admitting Provider Note  Report given to:   Additional Notes:

## 2023-03-14 NOTE — Progress Notes (Signed)
Echocardiogram 2D Echocardiogram has been performed.  Warren Lacy Keyandra Swenson RDCS 03/14/2023, 11:21 AM

## 2023-03-15 DIAGNOSIS — E785 Hyperlipidemia, unspecified: Secondary | ICD-10-CM | POA: Diagnosis present

## 2023-03-15 DIAGNOSIS — K7460 Unspecified cirrhosis of liver: Secondary | ICD-10-CM | POA: Diagnosis present

## 2023-03-15 DIAGNOSIS — Z8249 Family history of ischemic heart disease and other diseases of the circulatory system: Secondary | ICD-10-CM | POA: Diagnosis not present

## 2023-03-15 DIAGNOSIS — J9601 Acute respiratory failure with hypoxia: Secondary | ICD-10-CM | POA: Diagnosis present

## 2023-03-15 DIAGNOSIS — Z7901 Long term (current) use of anticoagulants: Secondary | ICD-10-CM | POA: Diagnosis not present

## 2023-03-15 DIAGNOSIS — I5033 Acute on chronic diastolic (congestive) heart failure: Secondary | ICD-10-CM | POA: Diagnosis present

## 2023-03-15 DIAGNOSIS — I350 Nonrheumatic aortic (valve) stenosis: Secondary | ICD-10-CM | POA: Diagnosis present

## 2023-03-15 DIAGNOSIS — Z1152 Encounter for screening for COVID-19: Secondary | ICD-10-CM | POA: Diagnosis not present

## 2023-03-15 DIAGNOSIS — I272 Pulmonary hypertension, unspecified: Secondary | ICD-10-CM | POA: Diagnosis present

## 2023-03-15 DIAGNOSIS — E119 Type 2 diabetes mellitus without complications: Secondary | ICD-10-CM | POA: Diagnosis present

## 2023-03-15 DIAGNOSIS — E8809 Other disorders of plasma-protein metabolism, not elsewhere classified: Secondary | ICD-10-CM | POA: Diagnosis present

## 2023-03-15 DIAGNOSIS — M3500 Sicca syndrome, unspecified: Secondary | ICD-10-CM | POA: Diagnosis present

## 2023-03-15 DIAGNOSIS — R7989 Other specified abnormal findings of blood chemistry: Secondary | ICD-10-CM | POA: Diagnosis not present

## 2023-03-15 DIAGNOSIS — Z952 Presence of prosthetic heart valve: Secondary | ICD-10-CM | POA: Diagnosis not present

## 2023-03-15 DIAGNOSIS — I2489 Other forms of acute ischemic heart disease: Secondary | ICD-10-CM | POA: Diagnosis present

## 2023-03-15 DIAGNOSIS — M797 Fibromyalgia: Secondary | ICD-10-CM | POA: Diagnosis present

## 2023-03-15 DIAGNOSIS — I480 Paroxysmal atrial fibrillation: Secondary | ICD-10-CM | POA: Diagnosis present

## 2023-03-15 DIAGNOSIS — K219 Gastro-esophageal reflux disease without esophagitis: Secondary | ICD-10-CM | POA: Diagnosis present

## 2023-03-15 DIAGNOSIS — Z881 Allergy status to other antibiotic agents status: Secondary | ICD-10-CM | POA: Diagnosis not present

## 2023-03-15 DIAGNOSIS — J441 Chronic obstructive pulmonary disease with (acute) exacerbation: Secondary | ICD-10-CM | POA: Diagnosis present

## 2023-03-15 DIAGNOSIS — F32A Depression, unspecified: Secondary | ICD-10-CM | POA: Diagnosis present

## 2023-03-15 DIAGNOSIS — Z9884 Bariatric surgery status: Secondary | ICD-10-CM | POA: Diagnosis not present

## 2023-03-15 DIAGNOSIS — K7581 Nonalcoholic steatohepatitis (NASH): Secondary | ICD-10-CM | POA: Diagnosis present

## 2023-03-15 DIAGNOSIS — R0602 Shortness of breath: Secondary | ICD-10-CM | POA: Diagnosis present

## 2023-03-15 DIAGNOSIS — Z886 Allergy status to analgesic agent status: Secondary | ICD-10-CM | POA: Diagnosis not present

## 2023-03-15 DIAGNOSIS — I5031 Acute diastolic (congestive) heart failure: Secondary | ICD-10-CM | POA: Diagnosis not present

## 2023-03-15 DIAGNOSIS — G4733 Obstructive sleep apnea (adult) (pediatric): Secondary | ICD-10-CM | POA: Diagnosis present

## 2023-03-15 LAB — MAGNESIUM: Magnesium: 2 mg/dL (ref 1.7–2.4)

## 2023-03-15 LAB — GLUCOSE, CAPILLARY
Glucose-Capillary: 160 mg/dL — ABNORMAL HIGH (ref 70–99)
Glucose-Capillary: 135 mg/dL — ABNORMAL HIGH (ref 70–99)
Glucose-Capillary: 99 mg/dL (ref 70–99)
Glucose-Capillary: 153 mg/dL — ABNORMAL HIGH (ref 70–99)
Glucose-Capillary: 293 mg/dL — ABNORMAL HIGH (ref 70–99)

## 2023-03-15 LAB — TROPONIN I (HIGH SENSITIVITY)
Troponin I (High Sensitivity): 45 ng/L — ABNORMAL HIGH (ref <18)
Troponin I (High Sensitivity): 37 ng/L — ABNORMAL HIGH (ref <18)

## 2023-03-15 LAB — BASIC METABOLIC PANEL
Anion gap: 7 (ref 5–15)
BUN: 34 mg/dL — ABNORMAL HIGH (ref 8–23)
CO2: 30 mmol/L (ref 22–32)
Calcium: 8.5 mg/dL — ABNORMAL LOW (ref 8.9–10.3)
Chloride: 101 mmol/L (ref 98–111)
Creatinine, Ser: 1.26 mg/dL — ABNORMAL HIGH (ref 0.44–1.00)
GFR, Estimated: 46 mL/min — ABNORMAL LOW (ref >60)
Glucose, Bld: 158 mg/dL — ABNORMAL HIGH (ref 70–99)
Potassium: 5.2 mmol/L — ABNORMAL HIGH (ref 3.5–5.1)
Sodium: 138 mmol/L (ref 135–145)

## 2023-03-15 LAB — ALPHA-1-ANTITRYPSIN: A-1 Antitrypsin, Ser: 149 mg/dL (ref 101–187)

## 2023-03-15 MED ORDER — ADULT MULTIVITAMIN W/MINERALS CH
1.0000 | ORAL_TABLET | Freq: Two times a day (BID) | ORAL | Status: DC
Start: 2023-03-15 — End: 2023-03-16
  Administered 2023-03-15 – 2023-03-16 (×2): 1 via ORAL
  Filled 2023-03-15 (×2): qty 1

## 2023-03-15 MED ORDER — ENSURE MAX PROTEIN PO LIQD
11.0000 [oz_av] | Freq: Every day | ORAL | Status: DC
  Administered 2023-03-16: 11 [oz_av] via ORAL
  Filled 2023-03-15: qty 330

## 2023-03-15 MED ORDER — CALCIUM CARBONATE ANTACID 500 MG PO CHEW
1.0000 | CHEWABLE_TABLET | Freq: Three times a day (TID) | ORAL | Status: DC
  Administered 2023-03-15 – 2023-03-16 (×3): 200 mg via ORAL
  Filled 2023-03-15 (×3): qty 1

## 2023-03-15 MED ORDER — DEXTROSE 5 % IV SOLN
500.0000 mg | INTRAVENOUS | Status: DC
  Administered 2023-03-15: 500 mg via INTRAVENOUS
  Filled 2023-03-15 (×2): qty 5

## 2023-03-15 MED ORDER — SODIUM CHLORIDE 0.9 % IV SOLN
1.0000 g | INTRAVENOUS | Status: DC
  Administered 2023-03-15: 1 g via INTRAVENOUS
  Filled 2023-03-15: qty 10

## 2023-03-15 NOTE — Progress Notes (Signed)
SATURATION QUALIFICATIONS: (This note is used to comply with regulatory documentation for home oxygen)  Patient Saturations on Room Air at Rest = 82%  Patient Saturations on Room Air while Ambulating = n/a%  Patient Saturations on 7 Liters of oxygen while Ambulating = 92%  Please briefly explain why patient needs home oxygen:  Pt cannot maintain SpO2 >90 unless on at least 7L of supplemental oxygen. Pt does benefit from cues for pursed lip breathing, and on RA from a seated position she was able to bring it up from 80 to 88, but unable to achieve 90 or higher. During mobility and activity she required at least 7L to maintain SpO2 of 90%.  Nyoka Cowden OTR/L Acute Rehabilitation Services Office: [phone]

## 2023-03-15 NOTE — Significant Event (Signed)
Rapid Response Event Note   Reason for Call :  Lethargy  Initial Focused Assessment:  Pt lying in bed with eyes closed, in no visible distress. She is on CPAP. Pt is easily arousable, denies SOB/CP. Pt will tell me her name, follow commands, and move all extremities equally. Her pupils are 2, equal, and reactive. Pt says she is sleepy.   HR-63, bp-109/73, RR-16, SpO2-95% on CPAP/3L, CBG-160   Interventions:  No RRT intervention needed at this time.  Plan of Care:  Continue to monitor pt. Please call RRT if further assistance needed.   Event Summary:   MD Notified:  Call [phone] Arrival Time:0240 End IHKV:[id number]  Terrilyn Saver, RN

## 2023-03-15 NOTE — Progress Notes (Signed)
Patient with no measured urine output overnight, encouraged patient to get on BSC.  Patient pad with moderate amount of urine.  Bladder scan showing .   Patient difficult to awake throughout night for vitals/labwork.  RR RN came to bedside to see patient. Patient able to follow commands and answer questions appropriately.

## 2023-03-15 NOTE — Progress Notes (Signed)
Rounding Note    Patient Name: Whitney [name] Date of Encounter: 03/15/2023  [facility] Cardiologist: Olga Millers, MD   Subjective   No CP; dyspnea improved  Inpatient Medications    Scheduled Meds:  amoxicillin-clavulanate  1 tablet Oral BID   apixaban  5 mg Oral BID   ezetimibe  10 mg Oral QHS   furosemide  40 mg Intravenous BID   gabapentin  100 mg Oral BID   And   gabapentin  300 mg Oral QHS   insulin aspart  0-15 Units Subcutaneous TID WC   insulin aspart  0-5 Units Subcutaneous QHS   methylPREDNISolone (SOLU-MEDROL) injection  60 mg Intravenous Daily   mometasone-formoterol  2 puff Inhalation BID   nadolol  40 mg Oral Daily   umeclidinium bromide  1 puff Inhalation Daily   Continuous Infusions:  PRN Meds: acetaminophen **OR** acetaminophen, albuterol, benzonatate, ondansetron **OR** ondansetron (ZOFRAN) IV   Vital Signs    Vitals:   03/14/23 1946 03/14/23 2256 03/14/23 2332 03/15/23 0403  BP: 110/65 127/80  116/82  Pulse: 74 72  63  Resp: 14 16  20   Temp: 98.5 F (36.9 C) (!) 97.4 F (36.3 C)  98.1 F (36.7 C)  TempSrc: Oral Oral  Oral  SpO2: 94% 95% 93% 95%  Weight:      Height:        Intake/Output Summary (Last 24 hours) at 03/15/2023 0742 Last data filed at 03/15/2023 0554 Gross per 24 hour  Intake 120 ml  Output 250 ml  Net -130 ml      03/13/2023    6:53 PM 03/12/2023    9:08 AM 01/31/2023    8:25 AM  Last 3 Weights  Weight (lbs) 230 lb 9.6 oz 230 lb 9.6 oz 223 lb  Weight (kg) 104.6 kg 104.599 kg 101.152 kg      Telemetry    Sinus with PVCs- Personally Reviewed   Physical Exam   GEN: No acute distress.   Neck: No JVD Cardiac: RRR Respiratory: Clear to auscultation bilaterally. GI: Soft, nontender, non-distended  MS: No edema Neuro:  Nonfocal  Psych: Normal affect   Labs    High Sensitivity Troponin:   Recent Labs  Lab 03/13/23 2210 03/14/23 0017 03/14/23 0746 03/14/23 1518 03/15/23 0039  TROPONINIHS  57* 64* 67* 75* 45*     Chemistry Recent Labs  Lab 03/13/23 1907 03/13/23 1912 03/14/23 0418 03/15/23 0219  NA 138 138 137 138  K 5.0 4.6 4.9 5.2*  CL 103 102 102 101  CO2 27  --  29 30  GLUCOSE 154* 148* 107* 158*  BUN 21 24* 23 34*  CREATININE 1.00 1.00 1.05* 1.26*  CALCIUM 8.8*  --  8.7* 8.5*  MG  --   --   --  2.0  PROT 6.2*  --   --   --   ALBUMIN 2.6*  --   --   --   AST 30  --   --   --   ALT 12  --   --   --   ALKPHOS 65  --   --   --   BILITOT 1.1  --   --   --   GFRNONAA >60  --  58* 46*  ANIONGAP 8  --  6 7    Hematology Recent Labs  Lab 03/13/23 1428 03/13/23 1907 03/13/23 1912 03/14/23 0418  WBC 5.7 6.3  --  6.7  RBC 4.62 4.55  --  4.75  HGB 14.2 13.9 15.0 14.2  HCT 44.2 44.1 44.0 46.6*  MCV 95.7 96.9  --  98.1  MCH  --  30.5  --  29.9  MCHC 32.2 31.5  --  30.5  RDW 14.9 14.6  --  14.7  PLT 97.0* 96*  --  103*   Thyroid  Recent Labs  Lab 03/14/23 0418  TSH 1.843    BNP Recent Labs  Lab 03/13/23 1428 03/13/23 2100  BNP  --  694.6*  PROBNP 996.0*  --      Radiology    ECHOCARDIOGRAM COMPLETE  Result Date: 03/14/2023    ECHOCARDIOGRAM REPORT   Patient Name:   BELOVED COBIA Buchmann Date of Exam: 03/14/2023 Medical Rec #:  [id number]      Height:       64.0 in Accession #:    [id number]     Weight:       230.6 lb Date of Birth:  [birth year]       BSA:          2.078 m Patient Age:    67 years       BP:           119/100 mmHg Patient Gender: F              HR:           77 bpm. Exam Location:  Inpatient Procedure: 2D Echo, Color Doppler and Cardiac Doppler Indications:    I50.31 Acute diastolic (congestive) heart failure  History:        Patient has prior history of Echocardiogram examinations, most                 recent 04/15/2022. COPD, Arrythmias:Atrial Fibrillation; Risk                 Factors:Diabetes, Dyslipidemia and Sleep Apnea.                 Aortic Valve: 21 mm Edwards Inspiris Resilia valve is present in                 the aortic position.  Procedure Date: 12/05/17.  Sonographer:    Irving Burton Senior RDCS Referring Phys: [phone] JARED M GARDNER IMPRESSIONS  1. Left ventricular ejection fraction, by estimation, is 65 to 70%. The left ventricle has normal function. The left ventricle has no regional wall motion abnormalities. There is moderate concentric left ventricular hypertrophy. Left ventricular diastolic function could not be evaluated.  2. Right ventricular systolic function is mildly reduced. The right ventricular size is normal. There is severely elevated pulmonary artery systolic pressure. The estimated right ventricular systolic pressure is 68.0 mmHg.  3. Left atrial size was severely dilated.  4. Right atrial size was mildly dilated.  5. Exuberant mitral annular calcification. The mitral valve is degenerative. Mild mitral valve regurgitation. Mild mitral stenosis. The mean mitral valve gradient is 4.5 mmHg with average heart rate of 77 bpm. Severe mitral annular calcification.  6. The tricuspid valve is abnormal. Tricuspid valve regurgitation is moderate to severe.  7. Findings most consistent with prosthesis patient mismatch with mildly elevated gradients that have increased slightly compared to prior exam. No significant prosthetic aortic valve stenosis. The aortic valve has been repaired/replaced. Aortic valve regurgitation is trivial and appears central. There is a 21 mm Edwards Inspiris Resilia valve present in the aortic position. Procedure Date: 12/05/17. Aortic valve area, by VTI measures 1.45 cm. Aortic valve mean gradient measures 23.7  mmHg. Aortic valve  Vmax measures 3.19 m/s. Aortic valve acceleration time measures 98 msec.  8. The inferior vena cava is dilated in size with <50% respiratory variability, suggesting right atrial pressure of 15 mmHg. FINDINGS  Left Ventricle: Left ventricular ejection fraction, by estimation, is 65 to 70%. The left ventricle has normal function. The left ventricle has no regional wall motion abnormalities. The  left ventricular internal cavity size was normal in size. There is  moderate concentric left ventricular hypertrophy. Left ventricular diastolic function could not be evaluated due to mitral annular calcification (moderate or greater). Left ventricular diastolic function could not be evaluated. Right Ventricle: The right ventricular size is normal. No increase in right ventricular wall thickness. Right ventricular systolic function is mildly reduced. There is severely elevated pulmonary artery systolic pressure. The tricuspid regurgitant velocity is 3.64 m/s, and with an assumed right atrial pressure of 15 mmHg, the estimated right ventricular systolic pressure is 68.0 mmHg. Left Atrium: Left atrial size was severely dilated. Right Atrium: Right atrial size was mildly dilated. Pericardium: There is no evidence of pericardial effusion. Mitral Valve: Exuberant mitral annular calcification. The mitral valve is degenerative in appearance. Severe mitral annular calcification. Mild mitral valve regurgitation. Mild mitral valve stenosis. MV peak gradient, 9.4 mmHg. The mean mitral valve gradient is 4.5 mmHg with average heart rate of 77 bpm. Tricuspid Valve: The tricuspid valve is abnormal. Tricuspid valve regurgitation is moderate to severe. Aortic Valve: Findings most consistent with prosthesis patient mismatch with mildly elevated gradients that have increased slightly compared to prior exam. No significant prosthetic aortic valve stenosis. The aortic valve has been repaired/replaced. Aortic valve regurgitation is trivial. Aortic valve mean gradient measures 23.7 mmHg. Aortic valve peak gradient measures 40.7 mmHg. Aortic valve area, by VTI measures 1.45 cm. There is a 21 mm Edwards Inspiris Resilia valve present in the aortic position. Procedure Date: 12/05/17. Pulmonic Valve: The pulmonic valve was normal in structure. Pulmonic valve regurgitation is mild. Aorta: The aortic root is normal in size and structure.  Ascending aorta measurements are within normal limits for age when indexed to body surface area. Venous: The inferior vena cava is dilated in size with less than 50% respiratory variability, suggesting right atrial pressure of 15 mmHg. IAS/Shunts: The interatrial septum was not well visualized.  LEFT VENTRICLE PLAX 2D LVIDd:         3.00 cm   Diastology LVIDs:         1.90 cm   LV e' medial:    5.11 cm/s LV PW:         1.30 cm   LV E/e' medial:  25.6 LV IVS:        1.50 cm   LV e' lateral:   6.42 cm/s LVOT diam:     2.10 cm   LV E/e' lateral: 20.4 LV SV:         92 LV SV Index:   44 LVOT Area:     3.46 cm  RIGHT VENTRICLE RV S prime:     8.92 cm/s TAPSE (M-mode): 1.8 cm LEFT ATRIUM              Index        RIGHT ATRIUM           Index LA diam:        5.60 cm  2.69 cm/m   RA Area:     19.60 cm LA Vol (A2C):   115.0 ml 55.33 ml/m  RA Volume:  59.50 ml  28.63 ml/m LA Vol (A4C):   101.0 ml 48.60 ml/m LA Biplane Vol: 112.0 ml 53.89 ml/m  AORTIC VALVE AV Area (Vmax):    1.30 cm AV Area (Vmean):   1.26 cm AV Area (VTI):     1.45 cm AV Vmax:           318.98 cm/s AV Vmean:          248.694 cm/s AV VTI:            0.637 m AV Peak Grad:      40.7 mmHg AV Mean Grad:      23.7 mmHg LVOT Vmax:         120.00 cm/s LVOT Vmean:        90.300 cm/s LVOT VTI:          0.266 m LVOT/AV VTI ratio: 0.42  AORTA Ao Root diam: 3.00 cm Ao Asc diam:  3.80 cm MITRAL VALVE                TRICUSPID VALVE MV Area (PHT): 1.67 cm     TR Peak grad:   53.0 mmHg MV Peak grad:  9.4 mmHg     TR Vmax:        364.00 cm/s MV Mean grad:  4.5 mmHg MV Vmax:       1.53 m/s     SHUNTS MV Vmean:      103.0 cm/s   Systemic VTI:  0.27 m MV E velocity: 131.00 cm/s  Systemic Diam: 2.10 cm MV A velocity: 111.00 cm/s MV E/A ratio:  1.18 Weston Brass MD Electronically signed by Weston Brass MD Signature Date/Time: 03/14/2023/3:41:27 PM    Final    DG Chest 2 View  Result Date: 03/13/2023 CLINICAL DATA:  Shortness of breath EXAM: CHEST - 2 VIEW  COMPARISON:  None Available. FINDINGS: The heart size and mediastinal contours are within normal limits. Both lungs are clear. The visualized skeletal structures are unremarkable. IMPRESSION: No active cardiopulmonary disease. Electronically Signed   By: Charlett Nose M.D.   On: 03/13/2023 21:05     Patient Profile      67 year old female with past medical history of prosthetic aortic valve, paroxysmal atrial fibrillation, prior CVA, paralyzed right hemidiaphragm, COPD, obstructive sleep apnea, Sjogren's, diabetes mellitus, hyperlipidemia, previous gastric sleeve, NASH and thrombocytopenia for evaluation of elevated troponin and question CHF. Echocardiogram shows normal LV function, moderate left ventricular hypertrophy, mild RV dysfunction, severe pulmonary hypertension, severe left atrial enlargement, mild right atrial enlargement, mild mitral digitation, mild mitral stenosis with mean gradient 4.5 mmHg, moderate to severe tricuspid regurgitation, status post aortic valve replacement with mean gradient 23.7 mmHg and trace aortic insufficiency.   Assessment & Plan    1 elevated troponin-as outlined previously patient has longstanding vague chest discomfort which is unchanged.  Her electrocardiogram showed no diagnostic ST changes and troponin elevation was minimal.  No plans for further ischemia evaluation.    2 dyspnea- improvement in symptoms this morning.  I believe her dyspnea is likely multifactorial including recent upper respiratory infection.  There may have been a contribution from volume excess but she is now mildly prerenal and not volume overloaded on exam.  Will discontinue diuretics.  Note she also has a history of elevated hemidiaphragm and COPD.  I think her pulmonary hypertension is secondary to a combination of pulmonary venous hypertension, valvular heart disease and COPD/elevated hemidiaphragm.  If dyspnea persists will consider CPX as an outpatient.  3 paroxysmal atrial  fibrillation-patient remains in sinus rhythm.  Continue apixaban and beta-blocker.   4 status post aortic valve replacement-continue SBE prophylaxis.  Mildly elevated mean gradient is felt likely secondary to patient prosthesis mismatch.  Patient can be discharged from a cardiac standpoint and we will arrange outpatient follow-up.  I do not think she requires diuretic at home.  Continue pulmonary toilet per primary service.  Cardiology will sign off.  Please call with questions.  For questions or updates, please contact [facility] Please consult [email or web address] for contact info under        Signed, Olga Millers, MD  03/15/2023, 7:42 AM

## 2023-03-15 NOTE — Progress Notes (Signed)
Physical Therapy Treatment Patient Details Name: Whitney [name] MRN: [id number] DOB: [birth year] Today's Date: 03/15/2023   History of Present Illness Whitney [name] is a 67 y.o. female seen in pulm office on 11/5 with four day h/o productive cough, orthopnea, peripheral edema new O2 needs and sent to ED 03/13/23. Dx with Acute hypoxic respiratory failure. PMH includes: NASH cirrhosis, DM2, PAF on eliquis, sjogren's syndrome, severe AS s/p AVR, and OSA on CPAP.    PT Comments  Patient progressing with mobility in hallway and with less O2 than in the am.  She reports prior issues with her balance and discussed benefit of rollator over quad cane.  Patient will continue to benefit from skilled PT for d/c home with HHPT.     If plan is discharge home, recommend the following: A little help with walking and/or transfers;A little help with bathing/dressing/bathroom;Assistance with cooking/housework;Assist for transportation;Help with stairs or ramp for entrance   Can travel by private vehicle        Equipment Recommendations  Rollator (4 wheels)    Recommendations for Other Services       Precautions / Restrictions Precautions Precautions: Fall Precaution Comments: watch SpO2 Restrictions Weight Bearing Restrictions: No     Mobility  Bed Mobility Overal bed mobility: Modified Independent                  Transfers Overall transfer level: Needs assistance Equipment used: Rolling walker (2 wheels) Transfers: Sit to/from Stand Sit to Stand: Supervision           General transfer comment: assist for lines    Ambulation/Gait Ambulation/Gait assistance: Supervision Gait Distance (Feet): 150 Feet Assistive device: Rolling walker (2 wheels) Gait Pattern/deviations: Step-through pattern, Decreased stride length       General Gait Details: one episode running into obstacle with RW able to self correct; discussed her concern for balance and likely would benefit from rollator  for home; SpO2 88-93% on 3L O2   Stairs             Wheelchair Mobility     Tilt Bed    Modified Rankin (Stroke Patients Only)       Balance Overall balance assessment: Needs assistance   Sitting balance-Leahy Scale: Good     Standing balance support: During functional activity Standing balance-Leahy Scale: Fair Standing balance comment: stepping from sink to bed no device                            Cognition Arousal: Alert Behavior During Therapy: WFL for tasks assessed/performed Overall Cognitive Status: Within Functional Limits for tasks assessed                                          Exercises      General Comments General comments (skin integrity, edema, etc.): stood at sink to brush teeth without support, SpO2 3L O2 88-94% throughout      Pertinent Vitals/Pain Pain Assessment Faces Pain Scale: No hurt    Home Living                          Prior Function            PT Goals (current goals can now be found in the care plan section) Progress towards PT goals: Progressing  toward goals    Frequency    Min 1X/week      PT Plan      Co-evaluation              AM-PAC PT "6 Clicks" Mobility   Outcome Measure  Help needed turning from your back to your side while in a flat bed without using bedrails?: None Help needed moving from lying on your back to sitting on the side of a flat bed without using bedrails?: None Help needed moving to and from a bed to a chair (including a wheelchair)?: None Help needed standing up from a chair using your arms (e.g., wheelchair or bedside chair)?: A Little Help needed to walk in hospital room?: A Little Help needed climbing 3-5 steps with a railing? : A Little 6 Click Score: 21    End of Session Equipment Utilized During Treatment: Oxygen Activity Tolerance: Patient tolerated treatment well Patient left: in bed;with call bell/phone within reach   PT  Visit Diagnosis: Unsteadiness on feet (R26.81);Other abnormalities of gait and mobility (R26.89);Muscle weakness (generalized) (M62.81)     Time: 1610-9604 PT Time Calculation (min) (ACUTE ONLY): 28 min  Charges:    $Gait Training: 8-22 mins $Therapeutic Activity: 8-22 mins PT General Charges $$ ACUTE PT VISIT: 1 Visit                     Sheran Lawless, PT Acute Rehabilitation Services Office:[phone] 03/15/2023    Elray Mcgregor 03/15/2023, 3:08 PM

## 2023-03-15 NOTE — Progress Notes (Signed)
Occupational Therapy Treatment Patient Details Name: Whitney [name] MRN: [id number] DOB: 30-May-1955 Today's Date: 03/15/2023   History of present illness Whitney [name] is a 67 y.o. female seen in pulm office on 11/5 with four day h/o productive cough, orthopnea, peripheral edema new O2 needs and sent to ED 03/13/23. Dx with Acute hypoxic respiratory failure. PMH includes: NASH cirrhosis, DM2, PAF on eliquis, sjogren's syndrome, severe AS s/p AVR, and OSA on CPAP.   OT comments  Pt progressing towards OT goals this session. Improved balance for static standing during grooming as well as all around with mobility - although did benefit from RW during dynamic ambulation. Pt continues to desaturate on RA, dropping to 80% on RA. During seated rest break with focused pursed lip breathing she was able to bring SpO2 up to 88%, but with activity quickly drops back to 80% and required 7L O2 via [place] to maintain during activity with cues to breathe through her nose. Pt educated on energy conservation strategies. POC remains appropriate at this time, and OT will continue to follow acutely.       If plan is discharge home, recommend the following:  A little help with walking and/or transfers;A little help with bathing/dressing/bathroom;Assistance with cooking/housework;Help with stairs or ramp for entrance   Equipment Recommendations  None recommended by OT (Pt has appropriate DME)    Recommendations for Other Services PT consult    Precautions / Restrictions Precautions Precautions: Fall Precaution Comments: watch SpO2 Restrictions Weight Bearing Restrictions: No       Mobility Bed Mobility Overal bed mobility: Needs Assistance Bed Mobility: Supine to Sit, Sit to Supine     Supine to sit: Supervision Sit to supine: Supervision   General bed mobility comments: increased time, effort,    Transfers Overall transfer level: Needs assistance Equipment used: Rolling walker (2 wheels) Transfers:  Sit to/from Stand, Bed to chair/wheelchair/BSC Sit to Stand: Contact guard assist     Step pivot transfers: Contact guard assist     General transfer comment: CGA for safety     Balance Overall balance assessment: Needs assistance Sitting-balance support: Feet unsupported, Single extremity supported Sitting balance-Leahy Scale: Good     Standing balance support: Single extremity supported, Bilateral upper extremity supported Standing balance-Leahy Scale: Fair Standing balance comment: moments of static standing at sink for ADL, but does reach out for external support from environment                           ADL either performed or assessed with clinical judgement   ADL Overall ADL's : Needs assistance/impaired Eating/Feeding: Modified independent;Bed level (HOB elevated) Eating/Feeding Details (indicate cue type and reason): eating lunch at end of session Grooming: Wash/dry hands;Contact guard assist;Standing Grooming Details (indicate cue type and reason): sink level                 Toilet Transfer: Contact guard assist;Ambulation;Rolling walker (2 wheels) Toilet Transfer Details (indicate cue type and reason): improved balance with RW and since initial eval - still benefits from [employer]- Clothing Manipulation and Hygiene: Set up;Sit to/from stand Toileting - Clothing Manipulation Details (indicate cue type and reason): managed peri care and underwear     Functional mobility during ADLs: Contact guard assist;Rolling walker (2 wheels) General ADL Comments: educated on energy conservation strategies in addition to toilet transfer, sink level grooming and walking O2 saturation qualificiation    Extremity/Trunk Assessment Upper Extremity Assessment Upper Extremity Assessment:  Generalized weakness            Vision       Perception     Praxis      Cognition Arousal: Alert Behavior During Therapy: WFL for tasks assessed/performed Overall  Cognitive Status: Within Functional Limits for tasks assessed                                          Exercises      Shoulder Instructions       General Comments      Pertinent Vitals/ Pain       Pain Assessment Pain Assessment: Faces Faces Pain Scale: No hurt Pain Intervention(s): Monitored during session, Repositioned  Home Living                                          Prior Functioning/Environment              Frequency  Min 1X/week        Progress Toward Goals  OT Goals(current goals can now be found in the care plan section)  Progress towards OT goals: Progressing toward goals  Acute Rehab OT Goals Patient Stated Goal: get home, get stronger OT Goal Formulation: With patient/family Time For Goal Achievement: 03/28/23 Potential to Achieve Goals: Good ADL Goals Pt Will Perform Grooming: with modified independence;standing Pt Will Perform Upper Body Dressing: with modified independence;sitting Pt Will Perform Lower Body Dressing: with modified independence;sit to/from stand Pt Will Transfer to Toilet: with modified independence;ambulating Pt Will Perform Toileting - Clothing Manipulation and hygiene: with modified independence;sitting/lateral leans;sit to/from stand Additional ADL Goal #1: Pt will verbalize at least 3 ways to conserve energy during ADL with no cues  Plan      Co-evaluation                 AM-PAC OT "6 Clicks" Daily Activity     Outcome Measure   Help from another person eating meals?: None Help from another person taking care of personal grooming?: A Little Help from another person toileting, which includes using toliet, bedpan, or urinal?: A Little Help from another person bathing (including washing, rinsing, drying)?: A Little Help from another person to put on and taking off regular upper body clothing?: A Little Help from another person to put on and taking off regular lower body  clothing?: A Little 6 Click Score: 19    End of Session Equipment Utilized During Treatment: Gait belt;Oxygen (7L)  OT Visit Diagnosis: Unsteadiness on feet (R26.81);Other abnormalities of gait and mobility (R26.89);Muscle weakness (generalized) (M62.81)   Activity Tolerance Patient tolerated treatment well   Patient Left in bed;with call bell/phone within reach;with family/visitor present;with bed alarm set   Nurse Communication Mobility status;Other (comment) (SpO2 results, urine in catch in toilet)        Time: 6213-0865 OT Time Calculation (min): 23 min  Charges: OT General Charges $OT Visit: 1 Visit OT Treatments $Self Care/Home Management : 8-22 mins  Nyoka Cowden OTR/L Acute Rehabilitation Services Office: [phone]  Evern Bio [facility] 03/15/2023, 11:58 AM

## 2023-03-15 NOTE — Progress Notes (Signed)
Heart Failure Navigator Progress Note  Assessed for Heart & Vascular TOC clinic readiness.  Patient with EF 65-70% admitted with dyspnea. Cardiology consulted and believe to be multifactorial in the setting of recent upper respiratory infection, COPD, pulmonary venous HTN, and valvular heart disease. Plan to follow up with [facility] as outpatient.   Navigator will sign off.  Sharen Hones, PharmD, BCPS Heart Failure Stewardship Pharmacist Phone [phone]

## 2023-03-15 NOTE — TOC Transition Note (Addendum)
Transition of Care [facility]) - CM/SW Discharge Note   Patient Details  Name: Whitney [name] MRN: [id number] Date of Birth: 16-Apr-1956  Transition of Care [facility]) CM/SW Contact:  Leone Haven, RN Phone Number: 03/15/2023, 2:15 PM   Clinical Narrative:    For possible dc today, oxygen tank is in the room which was set up by Corinda Gubler PCP and they also ordered a neb machine,  NCM set up [facility] services with [employer].  Son will transporr her home at [employer].  Need HHPT, HHOT orders. [employer] to supply rollator to room.   Final next level of care: Home w Home Health Services Barriers to Discharge: No Barriers Identified   Patient Goals and CMS Choice CMS Medicare.gov Compare Post Acute Care list provided to:: Patient Choice offered to / list presented to : Patient  Discharge Placement                         Discharge Plan and Services Additional resources added to the After Visit Summary for   In-house Referral: NA Discharge Planning Services: CM Consult Post Acute Care Choice: Home Health            DME Agency: NA       HH Arranged: PT, OT HH Agency: [facility] Date [facility] Agency Contacted: 03/15/23 Time HH Agency Contacted: 1411 Representative spoke with at [facility] Agency: Kandee Keen  Social Determinants of Health (SDOH) Interventions SDOH Screenings   Food Insecurity: No Food Insecurity (03/14/2023)  Housing: Low Risk  (03/14/2023)  Transportation Needs: No Transportation Needs (03/14/2023)  Utilities: Not At Risk (03/14/2023)  Depression (PHQ2-9): Low Risk  (09/20/2020)  Recent Concern: Depression (PHQ2-9) - Medium Risk (07/11/2020)  Tobacco Use: Low Risk  (03/13/2023)     Readmission Risk Interventions     No data to display

## 2023-03-15 NOTE — Telephone Encounter (Addendum)
Called patient. Per EPIC, patient is admitted to [facility] - [facility]C PROGR[facility]IVE [facility]. Left message to have patient call our clinic when she is d/c from hospital so we can follow up with [employer] to verify they go out to her home and get her set up on a home oxygen concentrator and nebulizer.  URGENT ORDERS WERE PLACED AT OV WITH KATIE 03/12/2023 TO [employer].  Called Brad with [employer].  He will note in their records that patient is in hospital at present.  He will mark the order for home oxygen and nebulizer as STAT. Once they are contacted by the patient's assigned nurse case manager at the hospital that the patient is discharged, [employer] will send a respiratory therapist out to patients home to deliver home oxygen concentrator/supplies and a nebulizer machine/supplies.  Florentina Addison - this is just FYI.

## 2023-03-15 NOTE — Plan of Care (Signed)
  Problem: Clinical Measurements: Goal: Diagnostic test results will improve Outcome: Progressing Goal: Respiratory complications will improve Outcome: Progressing Goal: Cardiovascular complication will be avoided Outcome: Progressing   Problem: Nutrition: Goal: Adequate nutrition will be maintained Outcome: Progressing   Problem: Coping: Goal: Level of anxiety will decrease Outcome: Progressing   Problem: Pain Management: Goal: General experience of comfort will improve Outcome: Progressing   Problem: Safety: Goal: Ability to remain free from injury will improve Outcome: Progressing

## 2023-03-15 NOTE — Progress Notes (Addendum)
Progress Note    Whitney BOCCIO   ZOX:[id number]  DOB: 1955/07/20  DOA: 03/13/2023     0 PCP: Daisy Floro, MD  Initial CC: fatigue, cough  Hospital Course: Whitney [name] is a 67 yo female with PMH PAF on Eliquis, AS s/p TAVR, NASH cirrhosis, depression, DM II, fibromyalgia, HLD, OSA, Sjogren's syndrome who presented with generalized fatigue/malaise and some associated shortness of breath. She was recently seen by pulmonology for similar complaints on 03/12/2023.  She was reporting cough with gray/white sputum production and low-grade temperature, 99.4 degrees.  She was also endorsing difficulty with laying flat.  Weight had also increased since prior office visits.  She was also found to be newly hypoxic on room air, 85% and placed on 3 L oxygen.  She was prescribed course of Augmentin, prednisone taper and treated with DuoNebs. CXR obtained in the ER was relatively unremarkable as was prior CXR on 11/5 during pulmonology visit.  Troponins were elevated but relatively flat change. EKG showed no ST or T wave changes; small Q wave appreciated in lead V2.  She was treated with a dose of Lasix and echo was also ordered on admission.   Interval History:  No events overnight.  Denies any chest pain.  Still has ongoing congested cough.  Also still having significant desaturation with ambulating. Needing up to 7L ambulating today. Cannot go home until weaned further.  Assessment and Plan: * Acute hypoxic respiratory failure (HCC) - No history of smoking but carries diagnosis of COPD per pulmonology notes -Presented with productive cough and generalized malaise with low-grade temperature at home and new oxygen demand.  Suspicion for PE has been low in setting of chronic anticoagulation for A-fib - CXR unremarkable - RVP negative; negative for covid and flu - continue nebulizers, Augmentin, steroid -Still having ongoing desaturations at rest and worse with ambulation; if no significant improvement  by tomorrow, may need home O2 at least temporarily  Elevated troponin - Currently no chest pain, nausea/vomiting, abdominal pain/indigestion.  She was however able to endorse she had a very mild sensation of "soreness" over her left chest some this past week which may have also been due to coughing - trops peaked at 75; appears to be consistent with demand ischemia - overall EKG reassuring; small Q wave noted V2; repeat EKG if any change in symptoms - echo has normal EF 65-70% and moderate LVH with suggestion of high RVSP -Appreciate cardiology evaluation.  No further ischemic workup recommended inpatient - Possible outpatient stress testing  Cirrhosis (HCC) - Also on differential as to cause of her edema was hypoalbuminemia - continue diet - normal alpha 1 AT  OSA (obstructive sleep apnea) CPAP per home settings  Paroxysmal atrial fibrillation (HCC) Cont eliquis and Corgard  DM2 (diabetes mellitus, type 2) (HCC) Currently on ozempic at home. However may have control issues while here due to being started on steroids. Will put on SSI AC/HS as needed.   Old records reviewed in assessment of this patient  Antimicrobials: Augmentin 11/7 >> current   DVT prophylaxis:   apixaban (ELIQUIS) tablet 5 mg   Code Status:   Code Status: Full Code  Mobility Assessment (Last 72 Hours)     Mobility Assessment     Row Name 03/15/23 1151 03/14/23 1956 03/14/23 1744 03/14/23 1338 03/14/23 1300   Does patient have an order for bedrest or is patient medically unstable -- No - Continue assessment No - Continue assessment -- --   What is  the highest level of mobility based on the progressive mobility assessment? Level 5 (Walks with assist in room/hall) - Balance while stepping forward/back and can walk in room with assist - Complete Level 5 (Walks with assist in room/hall) - Balance while stepping forward/back and can walk in room with assist - Complete Level 5 (Walks with assist in room/hall)  - Balance while stepping forward/back and can walk in room with assist - Complete Level 4 (Walks with assist in room) - Balance while marching in place and cannot step forward and back - Complete Level 4 (Walks with assist in room) - Balance while marching in place and cannot step forward and back - Complete            Barriers to discharge: none Disposition Plan:  Home Status is: Obs  Objective: Blood pressure 128/75, pulse 82, temperature 98.1 F (36.7 C), temperature source Oral, resp. rate 20, height 5\' 4"  (1.626 m), weight 104.6 kg, SpO2 95%.  Examination:  Physical Exam Constitutional:      Appearance: Normal appearance.  HENT:     Head: Normocephalic and atraumatic.     Mouth/Throat:     Mouth: Mucous membranes are moist.  Eyes:     Extraocular Movements: Extraocular movements intact.  Cardiovascular:     Rate and Rhythm: Normal rate and regular rhythm.  Pulmonary:     Effort: Pulmonary effort is normal. No respiratory distress.     Breath sounds: Rhonchi present. No wheezing.  Abdominal:     General: Bowel sounds are normal. There is no distension.     Palpations: Abdomen is soft.     Tenderness: There is no abdominal tenderness.  Musculoskeletal:        General: Normal range of motion.     Cervical back: Normal range of motion and neck supple.  Skin:    General: Skin is warm and dry.  Neurological:     General: No focal deficit present.     Mental Status: She is alert.  Psychiatric:        Mood and Affect: Mood normal.      Consultants:  Cardiology  Procedures:    Data Reviewed: Results for orders placed or performed during the hospital encounter of 03/13/23 (from the past 24 hour(s))  CBG monitoring, ED     Status: Abnormal   Collection Time: 03/14/23  1:43 PM  Result Value Ref Range   Glucose-Capillary 165 (H) 70 - 99 mg/dL  Troponin I (High Sensitivity)     Status: Abnormal   Collection Time: 03/14/23  3:18 PM  Result Value Ref Range    Troponin I (High Sensitivity) 75 (H) <18 ng/L  MRSA Next Gen by PCR, Nasal     Status: None   Collection Time: 03/14/23  5:59 PM   Specimen: Nasal Mucosa; Nasal Swab  Result Value Ref Range   MRSA by PCR Next Gen NOT DETECTED NOT DETECTED  Glucose, capillary     Status: Abnormal   Collection Time: 03/14/23  6:39 PM  Result Value Ref Range   Glucose-Capillary 171 (H) 70 - 99 mg/dL  Glucose, capillary     Status: Abnormal   Collection Time: 03/14/23  9:13 PM  Result Value Ref Range   Glucose-Capillary 179 (H) 70 - 99 mg/dL   Comment 1 Notify RN    Comment 2 Document in Chart   Troponin I (High Sensitivity)     Status: Abnormal   Collection Time: 03/15/23 12:39 AM  Result Value  Ref Range   Troponin I (High Sensitivity) 45 (H) <18 ng/L  Basic metabolic panel     Status: Abnormal   Collection Time: 03/15/23  2:19 AM  Result Value Ref Range   Sodium 138 135 - 145 mmol/L   Potassium 5.2 (H) 3.5 - 5.1 mmol/L   Chloride 101 98 - 111 mmol/L   CO2 30 22 - 32 mmol/L   Glucose, Bld 158 (H) 70 - 99 mg/dL   BUN 34 (H) 8 - 23 mg/dL   Creatinine, Ser 1.61 (H) 0.44 - 1.00 mg/dL   Calcium 8.5 (L) 8.9 - 10.3 mg/dL   GFR, Estimated 46 (L) >60 mL/min   Anion gap 7 5 - 15  Magnesium     Status: None   Collection Time: 03/15/23  2:19 AM  Result Value Ref Range   Magnesium 2.0 1.7 - 2.4 mg/dL  Glucose, capillary     Status: Abnormal   Collection Time: 03/15/23  2:30 AM  Result Value Ref Range   Glucose-Capillary 160 (H) 70 - 99 mg/dL  Glucose, capillary     Status: Abnormal   Collection Time: 03/15/23  5:55 AM  Result Value Ref Range   Glucose-Capillary 135 (H) 70 - 99 mg/dL   Comment 1 Notify RN    Comment 2 Document in Chart   Troponin I (High Sensitivity)     Status: Abnormal   Collection Time: 03/15/23  6:22 AM  Result Value Ref Range   Troponin I (High Sensitivity) 37 (H) <18 ng/L  Glucose, capillary     Status: None   Collection Time: 03/15/23 11:44 AM  Result Value Ref Range    Glucose-Capillary 99 70 - 99 mg/dL    I have reviewed pertinent nursing notes, vitals, labs, and images as necessary. I have ordered labwork to follow up on as indicated.  I have reviewed the last notes from staff over past 24 hours. I have discussed patient's care plan and test results with nursing staff, CM/SW, and other staff as appropriate.  Time spent: Greater than 50% of the 55 minute visit was spent in counseling/coordination of care for the patient as laid out in the A&P.   LOS: 0 days   Lewie Chamber, MD [facility] 03/15/2023, 1:06 PM

## 2023-03-15 NOTE — TOC Initial Note (Signed)
Transition of Care [facility]) - Initial/Assessment Note    Patient Details  Name: Whitney [name] MRN: [id number] Date of Birth: 22-Dec-1955  Transition of Care [facility]) CM/SW Contact:    Leone Haven, RN Phone Number: 03/15/2023, 2:11 PM  Clinical Narrative:                 From home with son, has PCP and insurance on file, states has no HH services in place at this time or DME at home. Per pt/ot eval rec HHPT, HHOT.  NCM offered choice, she chose [place].  NCM made referral to [facility] with [facility].  He is able to take referral.  Soc will begin 24 to 48 hrs post dc. States son will transport her  home at [employer] and he is support system, states [facility] [address].  Pta self ambulatory.  Patient has oxygen tank in the room from [employer],  NCM contacted Mitch ,he states that [place] office had already ordered the oxygen for patient and a neb machine and so they bought a tank to the room for her to go home with.  The neb machine and concentrator will be set up at the home per [place] with [employer].   Expected Discharge Plan: Home w Home Health Services Barriers to Discharge: No Barriers Identified   Patient Goals and CMS Choice Patient states their goals for this hospitalization and ongoing recovery are:: return home CMS Medicare.gov Compare Post Acute Care list provided to:: Patient Choice offered to / list presented to : Patient      Expected Discharge Plan and Services In-house Referral: NA Discharge Planning Services: CM Consult Post Acute Care Choice: Home Health Living arrangements for the past 2 months: Single Family Home                   DME Agency: NA       HH Arranged: PT, OT HH Agency: [facility] Date [facility] Agency Contacted: 03/15/23 Time HH Agency Contacted: 1411 Representative spoke with at [facility] Agency: Kandee Keen  Prior Living Arrangements/Services Living arrangements for the past 2 months: Single Family Home Lives with:: Adult Children Patient language  and need for interpreter reviewed:: Yes Do you feel safe going back to the place where you live?: Yes      Need for Family Participation in Patient Care: Yes (Comment) Care giver support system in place?: Yes (comment)   Criminal Activity/Legal Involvement Pertinent to Current Situation/Hospitalization: No - Comment as needed  Activities of Daily Living   ADL Screening (condition at time of admission) Independently performs ADLs?: Yes (appropriate for developmental age) Is the patient deaf or have difficulty hearing?: No Does the patient have difficulty seeing, even when wearing glasses/contacts?: No Does the patient have difficulty concentrating, remembering, or making decisions?: No  Permission Sought/Granted Permission sought to share information with : Case Manager Permission granted to share information with : Yes, Verbal Permission Granted              Emotional Assessment Appearance:: Appears stated age Attitude/Demeanor/Rapport: Engaged Affect (typically observed): Appropriate Orientation: : Oriented to Self, Oriented to Place, Oriented to  Time, Oriented to Situation Alcohol / Substance Use: Not Applicable Psych Involvement: No (comment)  Admission diagnosis:  Shortness of breath [R06.02] Hypoxia [R09.02] Acute hypoxic respiratory failure (HCC) [J96.01] Patient Active Problem List   Diagnosis Date Noted   Acute hypoxic respiratory failure (HCC) 03/14/2023   DM2 (diabetes mellitus, type 2) (HCC) 03/14/2023   Elevated  troponin 03/14/2023   COPD with acute exacerbation (HCC) 03/12/2023   Acute respiratory failure (HCC) 03/12/2023   Elevated diaphragm 03/12/2023   History of TIA (transient ischemic attack) 03/12/2023   Hypercoagulable state due to paroxysmal atrial fibrillation (HCC) 05/08/2022   Acute CVA (cerebrovascular accident) (HCC) 04/13/2022   COPD (chronic obstructive pulmonary disease) (HCC) 01/18/2021   Dyspnea 01/06/2019   Paroxysmal atrial fibrillation  (HCC) 01/24/2018   S/P AVR 12/05/2017   Severe aortic stenosis    Inguinal hernia 10/22/2016   Lower extremity edema 06/22/2015   Aortic valve disorder 04/21/2013   Edema 04/21/2013   Depression    OSA (obstructive sleep apnea)    Sjogren's syndrome (HCC)    Cirrhosis (HCC)    Hyperlipidemia    Fibromyalgia    Postmenopausal bleeding 12/11/2012   Endometrial polyp 12/11/2012   Chronic cough 04/11/2011   PCP:  Daisy Floro, MD Pharmacy[facility]   [phone] [place], [place] - [address] AT [facility] [address]  [place] [address] Phone: [phone] Fax: [phone]     Social Determinants of Health (SDOH) Social History: SDOH Screenings   Food Insecurity: No Food Insecurity (03/14/2023)  Housing: Low Risk  (03/14/2023)  Transportation Needs: No Transportation Needs (03/14/2023)  Utilities: Not At Risk (03/14/2023)  Depression (PHQ2-9): Low Risk  (09/20/2020)  Recent Concern: Depression (PHQ2-9) - Medium Risk (07/11/2020)  Tobacco Use: Low Risk  (03/13/2023)   SDOH Interventions:     Readmission Risk Interventions     No data to display

## 2023-03-15 NOTE — Plan of Care (Signed)
  Problem: Fluid Volume: Goal: Ability to maintain a balanced intake and output will improve Outcome: Progressing   Problem: Health Behavior/Discharge Planning: Goal: Ability to manage health-related needs will improve Outcome: Progressing   Problem: Metabolic: Goal: Ability to maintain appropriate glucose levels will improve Outcome: Progressing   Problem: Nutritional: Goal: Maintenance of adequate nutrition will improve Outcome: Progressing   Problem: Skin Integrity: Goal: Risk for impaired skin integrity will decrease Outcome: Progressing

## 2023-03-15 NOTE — Progress Notes (Signed)
Initial Nutrition Assessment  DOCUMENTATION CODES:   Obesity unspecified  INTERVENTION:   - Ensure Max po daily, each supplement provides 150 kcal and 30 grams of protein  - MVI with minerals BID  - Calcium carbonate 500 mg TID  - Encourage PO intake  NUTRITION DIAGNOSIS:   Increased nutrient needs related to chronic illness as evidenced by estimated needs.  GOAL:   Patient will meet greater than or equal to 90% of their needs  MONITOR:   PO intake, Supplement acceptance, Labs  REASON FOR ASSESSMENT:   Consult COPD Protocol  ASSESSMENT:   67 year old female who presented to the ED on 11/06 with SOB. PMH of NASH cirrhosis, T2DM, PAF, Sjogren's syndrome, severe AS s/p AVR, OSA on CPAP, COPD, gastric sleeve 2015, HLD.  Attempted to speak with pt at bedside. Pt unavailable and working with therapies at time of RD visit. Will attempt to obtain diet and weight history at follow-up.  Pt consumed 80% of 1 meal so far today. Reviewed available weight history in chart. Pt with weight gain over the last year. No evidence of recent weight loss.  RD to order high-protein oral nutrition supplements and inpatient vitamin/mineral regimen for s/p gastric sleeve.  Meal Completion: 80% x 1 documented meal  Medications reviewed and include: SSI, IV solu-medrol  Labs reviewed: potassium 5.2, BUN 34, creatinine 1.26, platelets 103, hemoglobin A1C 5.3 CBG's: 99-179 x 24 hours  NUTRITION - FOCUSED PHYSICAL EXAM:  Unable to complete at this time. Pt unavailable.  Diet Order:   Diet Order             Diet Carb Modified Fluid consistency: Thin; Room service appropriate? Yes  Diet effective now                   EDUCATION NEEDS:   No education needs have been identified at this time  Skin:  Skin Assessment: Reviewed RN Assessment  Last BM:  03/12/23  Height:   Ht Readings from Last 1 Encounters:  03/13/23 5\' 4"  (1.626 m)    Weight:   Wt Readings from Last 1  Encounters:  03/13/23 104.6 kg    BMI:  Body mass index is 39.58 kg/m.  Estimated Nutritional Needs:   Kcal:  1800-2000  Protein:  85-100 grams  Fluid:  1.8-2.0 L    Mertie Clause, MS, RD, LDN Registered Dietitian II Please see AMiON for contact information.

## 2023-03-16 DIAGNOSIS — R7989 Other specified abnormal findings of blood chemistry: Secondary | ICD-10-CM | POA: Diagnosis not present

## 2023-03-16 DIAGNOSIS — J9601 Acute respiratory failure with hypoxia: Secondary | ICD-10-CM | POA: Diagnosis not present

## 2023-03-16 LAB — GLUCOSE, CAPILLARY
Glucose-Capillary: 102 mg/dL — ABNORMAL HIGH (ref 70–99)
Glucose-Capillary: 123 mg/dL — ABNORMAL HIGH (ref 70–99)

## 2023-03-16 LAB — MAGNESIUM: Magnesium: 2.3 mg/dL (ref 1.7–2.4)

## 2023-03-16 LAB — BASIC METABOLIC PANEL
Anion gap: 6 (ref 5–15)
BUN: 36 mg/dL — ABNORMAL HIGH (ref 8–23)
CO2: 30 mmol/L (ref 22–32)
Calcium: 9 mg/dL (ref 8.9–10.3)
Chloride: 103 mmol/L (ref 98–111)
Creatinine, Ser: 1.05 mg/dL — ABNORMAL HIGH (ref 0.44–1.00)
GFR, Estimated: 58 mL/min — ABNORMAL LOW (ref >60)
Glucose, Bld: 108 mg/dL — ABNORMAL HIGH (ref 70–99)
Potassium: 5 mmol/L (ref 3.5–5.1)
Sodium: 139 mmol/L (ref 135–145)

## 2023-03-16 MED ORDER — AMOXICILLIN-POT CLAVULANATE 600-42.9 MG/5ML PO SUSR: 875.0000 mg | Freq: Two times a day (BID) | ORAL | 0 refills | Status: AC

## 2023-03-16 MED ORDER — PREDNISONE 10 MG PO TABS: ORAL_TABLET | ORAL | 0 refills | Status: DC

## 2023-03-16 NOTE — Progress Notes (Signed)
Nurse requested Mobility Specialist to perform oxygen saturation test with pt which includes removing pt from oxygen both at rest and while ambulating.  Below are the results from that testing.     Patient Saturations on Room Air at Rest = spO2 83%  Patient Saturations on Room Air while Ambulating = sp02 82% .    Patient Saturations on 4 Liters of oxygen while Ambulating = sp02 89-92%  At end of testing pt left in room on 2  Liters of oxygen.  Reported results to nurse.

## 2023-03-16 NOTE — TOC Transition Note (Signed)
Transition of Care [facility]) - CM/SW Discharge Note   Patient Details  Name: Whitney [name] MRN: [id number] Date of Birth: 08-24-1955  Transition of Care [facility] CM/SW Contact:  Ronny Bacon, RN Phone Number: 03/16/2023, 11:07 AM   Clinical Narrative:  Discharge order noted. Message sent to [facility] with Frances [facility] to inform of discharge. DME ordered by previous case Production designer, theatre/television/film. Per case manager note patient has oxygen set up from PCP.    Final next level of care: Home w Home Health Services Barriers to Discharge: No Barriers Identified   Patient Goals and CMS Choice CMS Medicare.gov Compare Post Acute Care list provided to:: Patient Choice offered to / list presented to : Patient  Discharge Placement                         Discharge Plan and Services Additional resources added to the After Visit Summary for   In-house Referral: NA Discharge Planning Services: CM Consult Post Acute Care Choice: Home Health            DME Agency: NA       HH Arranged: PT, OT HH Agency: [facility] Date [facility] Agency Contacted: 03/15/23 Time HH Agency Contacted: 1411 Representative spoke with at [facility] Agency: Kandee Keen  Social Determinants of Health (SDOH) Interventions SDOH Screenings   Food Insecurity: No Food Insecurity (03/14/2023)  Housing: Low Risk  (03/14/2023)  Transportation Needs: No Transportation Needs (03/14/2023)  Utilities: Not At Risk (03/14/2023)  Depression (PHQ2-9): Low Risk  (09/20/2020)  Recent Concern: Depression (PHQ2-9) - Medium Risk (07/11/2020)  Tobacco Use: Low Risk  (03/13/2023)     Readmission Risk Interventions     No data to display

## 2023-03-16 NOTE — Discharge Summary (Signed)
Physician Discharge Summary   Whitney [name] WJX:[id number] DOB: 1955/12/31 DOA: 03/13/2023  PCP: Daisy Floro, MD  Admit date: 03/13/2023 Discharge date:  03/16/2023  Admitted From: Home Disposition:  Home Discharging physician: Lewie Chamber, MD Barriers to discharge: none  Recommendations at discharge: Follow up with cardiology    Discharge Condition: stable CODE STATUS: Full Diet recommendation:  Diet Orders (From admission, onward)     Start     Ordered   03/16/23 0000  Diet Carb Modified        03/16/23 1052   03/14/23 0328  Diet Carb Modified Fluid consistency: Thin; Room service appropriate? Yes  Diet effective now       Question Answer Comment  Diet-HS Snack? Nothing   Calorie Level Medium 1600-2000   Fluid consistency: Thin   Room service appropriate? Yes      03/14/23 0327            Hospital Course: Whitney [name] is a 67 yo female with PMH PAF on Eliquis, AS s/p TAVR, NASH cirrhosis, depression, DM II, fibromyalgia, HLD, OSA, Sjogren's syndrome who presented with generalized fatigue/malaise and some associated shortness of breath. She was recently seen by pulmonology for similar complaints on 03/12/2023.  She was reporting cough with gray/white sputum production and low-grade temperature, 99.4 degrees.  She was also endorsing difficulty with laying flat.  Weight had also increased since prior office visits.  She was also found to be newly hypoxic on room air, 85% and placed on 3 L oxygen.  She was prescribed course of Augmentin, prednisone taper and treated with DuoNebs. CXR obtained in the ER was relatively unremarkable as was prior CXR on 11/5 during pulmonology visit.  Troponins were elevated but relatively flat change. EKG showed no ST or T wave changes; small Q wave appreciated in lead V2.  She was treated with a dose of Lasix and echo was also ordered on admission. She was evaluated by cardiology with no recommendations for further ischemic workup.   Elevated troponins were considered due to demand in setting of respiratory issues. She was arranged for home oxygen and was ambulating on 4 L with adequate saturations around 89 to 92%.  Assessment and Plan: * Acute hypoxic respiratory failure (HCC) - No history of smoking but carries diagnosis of COPD per pulmonology notes -Presented with productive cough and generalized malaise with low-grade temperature at home and new oxygen demand.  Suspicion for PE has been low in setting of chronic anticoagulation for A-fib - CXR unremarkable - RVP negative; negative for covid and flu - continue nebulizers, Augmentin, steroid; continued on home course of Augmentin at discharge and prednisone taper -Continue oxygen at home.  Ambulating well on 4 L with sats 89 to 92%  Elevated troponin - Currently no chest pain, nausea/vomiting, abdominal pain/indigestion.  She was however able to endorse she had a very mild sensation of "soreness" over her left chest some this past week which may have also been due to coughing - trops peaked at 75; appears to be consistent with demand ischemia - overall EKG reassuring; small Q wave noted V2; repeat EKG if any change in symptoms - echo has normal EF 65-70% and moderate LVH with suggestion of high RVSP -Appreciate cardiology evaluation.  No further ischemic workup recommended inpatient - Possible outpatient stress testing  Cirrhosis (HCC) - Also on differential as to cause of her edema was hypoalbuminemia - continue diet - normal alpha 1 AT  OSA (obstructive sleep apnea) CPAP per  home settings  Paroxysmal atrial fibrillation (HCC) Cont eliquis and Corgard  DM2 (diabetes mellitus, type 2) (HCC) - Resume home regimen   The patient's acute and chronic medical conditions were treated accordingly. On day of discharge, patient was felt deemed stable for discharge. Patient/family member advised to call PCP or come back to ER if needed.   Principal Diagnosis: Acute  hypoxic respiratory failure [facility])  Discharge Diagnoses: Active Hospital Problems   Diagnosis Date Noted   Acute hypoxic respiratory failure (HCC) 03/14/2023    Priority: 1.   Elevated troponin 03/14/2023    Priority: 2.   Cirrhosis (HCC)     Priority: 4.   OSA (obstructive sleep apnea)     Priority: 4.   Paroxysmal atrial fibrillation (HCC) 01/24/2018    Priority: 7.   Acute hypoxemic respiratory failure (HCC) 03/15/2023   DM2 (diabetes mellitus, type 2) (HCC) 03/14/2023   S/P AVR 12/05/2017   Sjogren's syndrome [facility])     Resolved Hospital Problems  No resolved problems to display.     Discharge Instructions     Diet Carb Modified   Complete by: As directed    Increase activity slowly   Complete by: As directed       Allergies as of 03/16/2023       Reactions   Doxycycline Hives, Rash   Naproxen Rash, Hives        Medication List     TAKE these medications    albuterol 108 (90 Base) MCG/ACT inhaler Commonly known as: VENTOLIN HFA INHALE 2 PUFFS INTO THE LUNGS EVERY 4 HOURS AS NEEDED FOR WHEEZING OR SHORTNESS OF BREATH.   albuterol (2.5 MG/3ML) 0.083% nebulizer solution Commonly known as: PROVENTIL Take 3 mLs (2.5 mg total) by nebulization every 6 (six) hours as needed for wheezing or shortness of breath.   amoxicillin-clavulanate 875-125 MG tablet Commonly known as: AUGMENTIN Take 1 tablet by mouth 2 (two) times daily for 7 days.   benzonatate 200 MG capsule Commonly known as: TESSALON Take 1 capsule (200 mg total) by mouth 3 (three) times daily as needed for cough.   Eliquis 5 MG Tabs tablet Generic drug: apixaban TAKE 1 TABLET BY MOUTH TWICE A DAY   ezetimibe 10 MG tablet Commonly known as: ZETIA Take 10 mg by mouth at bedtime.   gabapentin 100 MG capsule Commonly known as: Neurontin Take 1 capsule (100 mg total) by mouth 2 (two) times daily at 10 am and 4 pm AND 3 capsules (300 mg total) at bedtime.   milk thistle 175 MG tablet Take 175 mg  by mouth See admin instructions. Take one tablet by mouth twice a week   nadolol 40 MG tablet Commonly known as: CORGARD TAKE 1 TABLET BY MOUTH EVERY DAY   Ozempic (0.25 or 0.5 MG/DOSE) 2 MG/3ML Sopn Generic drug: Semaglutide(0.25 or 0.5MG /DOS) Inject 2 mg into the skin once a week.   predniSONE 10 MG tablet Commonly known as: DELTASONE Take 4 tablets by mouth daily for 3 days, then 3 tabs daily for 3 days, then 2 tabs daily for 3 days, then 1 tab daily for 3 days What changed: additional instructions   promethazine-dextromethorphan 6.25-15 MG/5ML syrup Commonly known as: PROMETHAZINE-DM Take 5 mLs by mouth 4 (four) times daily as needed. What changed: reasons to take this   Stiolto Respimat 2.5-2.5 MCG/ACT Aers Generic drug: Tiotropium Bromide-Olodaterol INHALE 2 PUFFS INTO THE LUNGS DAILY. INHALE 2 PUFFS BY MOUTH INTO THE LUNGS DAILY  Durable Medical Equipment  (From admission, onward)           Start     Ordered   03/15/23 1508  For home use only DME 4 wheeled rolling walker with seat  Once       Question:  Patient needs a walker to treat with the following condition  Answer:  Weakness   03/15/23 1507            Follow-up Information     Corrin Parker, PA-C Follow up.   Specialty: Cardiology Why: Hospital follow-up with Cardiology scheduled for 03/25/2023 at 2:45pm at our [facility]. Please arrive 15 minutes early for check-in. If this date/ time does not work for you, please call our office to reschedule. Contact information: [address] [place] [address] [phone]         [facility] Follow up.   Specialty: Home Health Services Why: Agency will call you to set up apt times Contact information: [address] [place] [address] [phone]                Allergies  Allergen Reactions   Doxycycline Hives and Rash   Naproxen Rash and Hives     Consultations: Cardiology   Procedures:   Discharge Exam: BP (!) 142/92 (BP Location: Left Arm)   Pulse 66   Temp 98.4 F (36.9 C) (Oral)   Resp 20   Ht 5\' 4"  (1.626 m)   Wt 104.6 kg   SpO2 100%   BMI 39.58 kg/m  Physical Exam Constitutional:      Appearance: Normal appearance.  HENT:     Head: Normocephalic and atraumatic.     Mouth/Throat:     Mouth: Mucous membranes are moist.  Eyes:     Extraocular Movements: Extraocular movements intact.  Cardiovascular:     Rate and Rhythm: Normal rate and regular rhythm.  Pulmonary:     Effort: Pulmonary effort is normal. No respiratory distress.     Breath sounds: Rhonchi present. No wheezing.  Abdominal:     General: Bowel sounds are normal. There is no distension.     Palpations: Abdomen is soft.     Tenderness: There is no abdominal tenderness.  Musculoskeletal:        General: Normal range of motion.     Cervical back: Normal range of motion and neck supple.  Skin:    General: Skin is warm and dry.  Neurological:     General: No focal deficit present.     Mental Status: She is alert.  Psychiatric:        Mood and Affect: Mood normal.      The results of significant diagnostics from this hospitalization (including imaging, microbiology, ancillary and laboratory) are listed below for reference.   Microbiology: Recent Results (from the past 240 hour(s))  Culture, blood (Routine x 2)     Status: None (Preliminary result)   Collection Time: 03/13/23  7:07 PM   Specimen: BLOOD RIGHT ARM  Result Value Ref Range Status   Specimen Description BLOOD RIGHT ARM  Final   Special Requests   Final    BOTTLES DRAWN AEROBIC AND ANAEROBIC Blood Culture adequate volume   Culture   Final    NO GROWTH 3 DAYS Performed at [facility], [address] [place], [place] [address]    Report Status PENDING  Incomplete  Culture, blood (Routine x 2)  Status: None (Preliminary result)   Collection Time: 03/13/23  9:00 PM    Specimen: BLOOD RIGHT HAND  Result Value Ref Range Status   Specimen Description BLOOD RIGHT HAND  Final   Special Requests   Final    BOTTLES DRAWN AEROBIC AND ANAEROBIC Blood Culture results may not be optimal due to an excessive volume of blood received in culture bottles   Culture   Final    NO GROWTH 3 DAYS Performed at [facility], [address], [place], [place] [address]    Report Status PENDING  Incomplete  Respiratory (~20 pathogens) panel by PCR     Status: None   Collection Time: 03/14/23  7:48 AM   Specimen: Nasopharyngeal Swab; Respiratory  Result Value Ref Range Status   Adenovirus NOT DETECTED NOT DETECTED Final   Coronavirus 229E NOT DETECTED NOT DETECTED Final    Comment: (NOTE) The Coronavirus on the Respiratory Panel, DOES NOT test for the novel  Coronavirus (2019 nCoV)    Coronavirus HKU1 NOT DETECTED NOT DETECTED Final   Coronavirus NL63 NOT DETECTED NOT DETECTED Final   Coronavirus OC43 NOT DETECTED NOT DETECTED Final   Metapneumovirus NOT DETECTED NOT DETECTED Final   Rhinovirus / Enterovirus NOT DETECTED NOT DETECTED Final   Influenza A NOT DETECTED NOT DETECTED Final   Influenza B NOT DETECTED NOT DETECTED Final   Parainfluenza Virus 1 NOT DETECTED NOT DETECTED Final   Parainfluenza Virus 2 NOT DETECTED NOT DETECTED Final   Parainfluenza Virus 3 NOT DETECTED NOT DETECTED Final   Parainfluenza Virus 4 NOT DETECTED NOT DETECTED Final   Respiratory Syncytial Virus NOT DETECTED NOT DETECTED Final   Bordetella pertussis NOT DETECTED NOT DETECTED Final   Bordetella Parapertussis NOT DETECTED NOT DETECTED Final   Chlamydophila pneumoniae NOT DETECTED NOT DETECTED Final   Mycoplasma pneumoniae NOT DETECTED NOT DETECTED Final    Comment: Performed at [facility], [address] [place], [place] [address]  MRSA Next Gen by PCR, Nasal     Status: None   Collection Time: 03/14/23  5:59 PM   Specimen: Nasal Mucosa; Nasal Swab  Result Value Ref  Range Status   MRSA by PCR Next Gen NOT DETECTED NOT DETECTED Final    Comment: (NOTE) The GeneXpert MRSA Assay (FDA approved for NASAL specimens only), is one component of a comprehensive MRSA colonization surveillance program. It is not intended to diagnose MRSA infection nor to guide or monitor treatment for MRSA infections. Test performance is not FDA approved in patients less than 40 years old. Performed at [facility], [address] [place], [place] [address]      Labs: BNP (last 3 results) Recent Labs    04/14/22 0242 03/13/23 2100  BNP 293.6* 694.6*   Basic Metabolic Panel: Recent Labs  Lab 03/13/23 1428 03/13/23 1907 03/13/23 1912 03/14/23 0418 03/15/23 0219 03/16/23 0215  NA 138 138 138 137 138 139  K 4.7 5.0 4.6 4.9 5.2* 5.0  CL 101 103 102 102 101 103  CO2 32 27  --  29 30 30   GLUCOSE 192* 154* 148* 107* 158* 108*  BUN 19 21 24* 23 34* 36*  CREATININE 0.90 1.00 1.00 1.05* 1.26* 1.05*  CALCIUM 8.8 8.8*  --  8.7* 8.5* 9.0  MG  --   --   --   --  2.0 2.3   Liver Function Tests: Recent Labs  Lab 03/13/23 1907  AST 30  ALT 12  ALKPHOS 65  BILITOT 1.1  PROT 6.2*  ALBUMIN 2.6*   No results for input(s): "LIPASE", "AMYLASE" in the last 168 hours. No results for input(s): "AMMONIA" in the last 168 hours. CBC: Recent Labs  Lab 03/13/23 1428 03/13/23 1907 03/13/23 1912 03/14/23 0418  WBC 5.7 6.3  --  6.7  NEUTROABS 4.2 4.6  --   --   HGB 14.2 13.9 15.0 14.2  HCT 44.2 44.1 44.0 46.6*  MCV 95.7 96.9  --  98.1  PLT 97.0* 96*  --  103*   Cardiac Enzymes: No results for input(s): "CKTOTAL", "CKMB", "CKMBINDEX", "TROPONINI" in the last 168 hours. BNP: Invalid input(s): "POCBNP" CBG: Recent Labs  Lab 03/15/23 1144 03/15/23 1604 03/15/23 2123 03/16/23 0617 03/16/23 1131  GLUCAP 99 153* 293* 102* 123*   D-Dimer No results for input(s): "DDIMER" in the last 72 hours. Hgb A1c Recent Labs    03/14/23 0418  HGBA1C 5.3   Lipid  Profile No results for input(s): "CHOL", "HDL", "LDLCALC", "TRIG", "CHOLHDL", "LDLDIRECT" in the last 72 hours. Thyroid function studies Recent Labs    03/14/23 0418  TSH 1.843   Anemia work up No results for input(s): "VITAMINB12", "FOLATE", "FERRITIN", "TIBC", "IRON", "RETICCTPCT" in the last 72 hours. Urinalysis    Component Value Date/Time   COLORURINE YELLOW 04/13/2022 1651   APPEARANCEUR CLEAR 04/13/2022 1651   LABSPEC 1.024 04/13/2022 1651   PHURINE 5.0 04/13/2022 1651   GLUCOSEU NEGATIVE 04/13/2022 1651   HGBUR SMALL (A) 04/13/2022 1651   BILIRUBINUR NEGATIVE 04/13/2022 1651   KETONESUR NEGATIVE 04/13/2022 1651   PROTEINUR 100 (A) 04/13/2022 1651   UROBILINOGEN 1.0 02/15/2015 0837   NITRITE NEGATIVE 04/13/2022 1651   LEUKOCYTESUR NEGATIVE 04/13/2022 1651   Sepsis Labs Recent Labs  Lab 03/13/23 1428 03/13/23 1907 03/14/23 0418  WBC 5.7 6.3 6.7   Microbiology Recent Results (from the past 240 hour(s))  Culture, blood (Routine x 2)     Status: None (Preliminary result)   Collection Time: 03/13/23  7:07 PM   Specimen: BLOOD RIGHT ARM  Result Value Ref Range Status   Specimen Description BLOOD RIGHT ARM  Final   Special Requests   Final    BOTTLES DRAWN AEROBIC AND ANAEROBIC Blood Culture adequate volume   Culture   Final    NO GROWTH 3 DAYS Performed at [facility], [address], [place], [place] [address]    Report Status PENDING  Incomplete  Culture, blood (Routine x 2)     Status: None (Preliminary result)   Collection Time: 03/13/23  9:00 PM   Specimen: BLOOD RIGHT HAND  Result Value Ref Range Status   Specimen Description BLOOD RIGHT HAND  Final   Special Requests   Final    BOTTLES DRAWN AEROBIC AND ANAEROBIC Blood Culture results may not be optimal due to an excessive volume of blood received in culture bottles   Culture   Final    NO GROWTH 3 DAYS Performed at [facility], [address], [place], [place] [address]    Report  Status PENDING  Incomplete  Respiratory (~20 pathogens) panel by PCR     Status: None   Collection Time: 03/14/23  7:48 AM   Specimen: Nasopharyngeal Swab; Respiratory  Result Value Ref Range Status   Adenovirus NOT DETECTED NOT DETECTED Final   Coronavirus 229E NOT DETECTED NOT DETECTED Final    Comment: (NOTE) The Coronavirus on the Respiratory Panel, DOES NOT test for the novel  Coronavirus (  2019 nCoV)    Coronavirus HKU1 NOT DETECTED NOT DETECTED Final   Coronavirus NL63 NOT DETECTED NOT DETECTED Final   Coronavirus OC43 NOT DETECTED NOT DETECTED Final   Metapneumovirus NOT DETECTED NOT DETECTED Final   Rhinovirus / Enterovirus NOT DETECTED NOT DETECTED Final   Influenza A NOT DETECTED NOT DETECTED Final   Influenza B NOT DETECTED NOT DETECTED Final   Parainfluenza Virus 1 NOT DETECTED NOT DETECTED Final   Parainfluenza Virus 2 NOT DETECTED NOT DETECTED Final   Parainfluenza Virus 3 NOT DETECTED NOT DETECTED Final   Parainfluenza Virus 4 NOT DETECTED NOT DETECTED Final   Respiratory Syncytial Virus NOT DETECTED NOT DETECTED Final   Bordetella pertussis NOT DETECTED NOT DETECTED Final   Bordetella Parapertussis NOT DETECTED NOT DETECTED Final   Chlamydophila pneumoniae NOT DETECTED NOT DETECTED Final   Mycoplasma pneumoniae NOT DETECTED NOT DETECTED Final    Comment: Performed at [facility], [address], [place], [place] [address]  MRSA Next Gen by PCR, Nasal     Status: None   Collection Time: 03/14/23  5:59 PM   Specimen: Nasal Mucosa; Nasal Swab  Result Value Ref Range Status   MRSA by PCR Next Gen NOT DETECTED NOT DETECTED Final    Comment: (NOTE) The GeneXpert MRSA Assay (FDA approved for NASAL specimens only), is one component of a comprehensive MRSA colonization surveillance program. It is not intended to diagnose MRSA infection nor to guide or monitor treatment for MRSA infections. Test performance is not FDA approved in patients less than 41  years old. Performed at [facility], [address], [place], [place] [address]     Procedures/Studies: ECHOCARDIOGRAM COMPLETE  Result Date: 03/14/2023    ECHOCARDIOGRAM REPORT   Patient Name:   Whitney [name] Date of Exam: 03/14/2023 Medical Rec #:  [id number]      Height:       64.0 in Accession #:    [id number]     Weight:       230.6 lb Date of Birth:  July 17, 1955       BSA:          2.078 m Patient Age:    67 years       BP:           119/100 mmHg Patient Gender: F              HR:           77 bpm. Exam Location:  Inpatient Procedure: 2D Echo, Color Doppler and Cardiac Doppler Indications:    I50.31 Acute diastolic (congestive) heart failure  History:        Patient has prior history of Echocardiogram examinations, most                 recent 04/15/2022. COPD, Arrythmias:Atrial Fibrillation; Risk                 Factors:Diabetes, Dyslipidemia and Sleep Apnea.                 Aortic Valve: 21 mm Edwards Inspiris Resilia valve is present in                 the aortic position. Procedure Date: 12/05/17.  Sonographer:    Irving Burton Senior RDCS Referring Phys: [phone] JARED M GARDNER IMPRESSIONS  1. Left ventricular ejection fraction, by estimation, is 65 to 70%. The left ventricle has normal function. The left ventricle has no regional wall  motion abnormalities. There is moderate concentric left ventricular hypertrophy. Left ventricular diastolic function could not be evaluated.  2. Right ventricular systolic function is mildly reduced. The right ventricular size is normal. There is severely elevated pulmonary artery systolic pressure. The estimated right ventricular systolic pressure is 68.0 mmHg.  3. Left atrial size was severely dilated.  4. Right atrial size was mildly dilated.  5. Exuberant mitral annular calcification. The mitral valve is degenerative. Mild mitral valve regurgitation. Mild mitral stenosis. The mean mitral valve gradient is 4.5 mmHg with average heart rate of 77 bpm. Severe mitral annular  calcification.  6. The tricuspid valve is abnormal. Tricuspid valve regurgitation is moderate to severe.  7. Findings most consistent with prosthesis patient mismatch with mildly elevated gradients that have increased slightly compared to prior exam. No significant prosthetic aortic valve stenosis. The aortic valve has been repaired/replaced. Aortic valve regurgitation is trivial and appears central. There is a 21 mm Edwards Inspiris Resilia valve present in the aortic position. Procedure Date: 12/05/17. Aortic valve area, by VTI measures 1.45 cm. Aortic valve mean gradient measures 23.7 mmHg. Aortic valve  Vmax measures 3.19 m/s. Aortic valve acceleration time measures 98 msec.  8. The inferior vena cava is dilated in size with <50% respiratory variability, suggesting right atrial pressure of 15 mmHg. FINDINGS  Left Ventricle: Left ventricular ejection fraction, by estimation, is 65 to 70%. The left ventricle has normal function. The left ventricle has no regional wall motion abnormalities. The left ventricular internal cavity size was normal in size. There is  moderate concentric left ventricular hypertrophy. Left ventricular diastolic function could not be evaluated due to mitral annular calcification (moderate or greater). Left ventricular diastolic function could not be evaluated. Right Ventricle: The right ventricular size is normal. No increase in right ventricular wall thickness. Right ventricular systolic function is mildly reduced. There is severely elevated pulmonary artery systolic pressure. The tricuspid regurgitant velocity is 3.64 m/s, and with an assumed right atrial pressure of 15 mmHg, the estimated right ventricular systolic pressure is 68.0 mmHg. Left Atrium: Left atrial size was severely dilated. Right Atrium: Right atrial size was mildly dilated. Pericardium: There is no evidence of pericardial effusion. Mitral Valve: Exuberant mitral annular calcification. The mitral valve is degenerative in  appearance. Severe mitral annular calcification. Mild mitral valve regurgitation. Mild mitral valve stenosis. MV peak gradient, 9.4 mmHg. The mean mitral valve gradient is 4.5 mmHg with average heart rate of 77 bpm. Tricuspid Valve: The tricuspid valve is abnormal. Tricuspid valve regurgitation is moderate to severe. Aortic Valve: Findings most consistent with prosthesis patient mismatch with mildly elevated gradients that have increased slightly compared to prior exam. No significant prosthetic aortic valve stenosis. The aortic valve has been repaired/replaced. Aortic valve regurgitation is trivial. Aortic valve mean gradient measures 23.7 mmHg. Aortic valve peak gradient measures 40.7 mmHg. Aortic valve area, by VTI measures 1.45 cm. There is a 21 mm Edwards Inspiris Resilia valve present in the aortic position. Procedure Date: 12/05/17. Pulmonic Valve: The pulmonic valve was normal in structure. Pulmonic valve regurgitation is mild. Aorta: The aortic root is normal in size and structure. Ascending aorta measurements are within normal limits for age when indexed to body surface area. Venous: The inferior vena cava is dilated in size with less than 50% respiratory variability, suggesting right atrial pressure of 15 mmHg. IAS/Shunts: The interatrial septum was not well visualized.  LEFT VENTRICLE PLAX 2D LVIDd:         3.00 cm  Diastology LVIDs:         1.90 cm   LV e' medial:    5.11 cm/s LV PW:         1.30 cm   LV E/e' medial:  25.6 LV IVS:        1.50 cm   LV e' lateral:   6.42 cm/s LVOT diam:     2.10 cm   LV E/e' lateral: 20.4 LV SV:         92 LV SV Index:   44 LVOT Area:     3.46 cm  RIGHT VENTRICLE RV S prime:     8.92 cm/s TAPSE (M-mode): 1.8 cm LEFT ATRIUM              Index        RIGHT ATRIUM           Index LA diam:        5.60 cm  2.69 cm/m   RA Area:     19.60 cm LA Vol (A2C):   115.0 ml 55.33 ml/m  RA Volume:   59.50 ml  28.63 ml/m LA Vol (A4C):   101.0 ml 48.60 ml/m LA Biplane Vol: 112.0 ml  53.89 ml/m  AORTIC VALVE AV Area (Vmax):    1.30 cm AV Area (Vmean):   1.26 cm AV Area (VTI):     1.45 cm AV Vmax:           318.98 cm/s AV Vmean:          248.694 cm/s AV VTI:            0.637 m AV Peak Grad:      40.7 mmHg AV Mean Grad:      23.7 mmHg LVOT Vmax:         120.00 cm/s LVOT Vmean:        90.300 cm/s LVOT VTI:          0.266 m LVOT/AV VTI ratio: 0.42  AORTA Ao Root diam: 3.00 cm Ao Asc diam:  3.80 cm MITRAL VALVE                TRICUSPID VALVE MV Area (PHT): 1.67 cm     TR Peak grad:   53.0 mmHg MV Peak grad:  9.4 mmHg     TR Vmax:        364.00 cm/s MV Mean grad:  4.5 mmHg MV Vmax:       1.53 m/s     SHUNTS MV Vmean:      103.0 cm/s   Systemic VTI:  0.27 m MV E velocity: 131.00 cm/s  Systemic Diam: 2.10 cm MV A velocity: 111.00 cm/s MV E/A ratio:  1.18 Weston Brass MD Electronically signed by Weston Brass MD Signature Date/Time: 03/14/2023/3:41:27 PM    Final    DG Chest 2 View  Result Date: 03/13/2023 CLINICAL DATA:  Shortness of breath EXAM: CHEST - 2 VIEW COMPARISON:  None Available. FINDINGS: The heart size and mediastinal contours are within normal limits. Both lungs are clear. The visualized skeletal structures are unremarkable. IMPRESSION: No active cardiopulmonary disease. Electronically Signed   By: Charlett Nose M.D.   On: 03/13/2023 21:05   DG Chest 2 View  Result Date: 03/12/2023 CLINICAL DATA:  Cough and shortness of breath. EXAM: CHEST - 2 VIEW COMPARISON:  CT chest dated April 14, 2022. Chest x-ray dated April 13, 2022. FINDINGS: Stable cardiomediastinal silhouette with mild cardiomegaly and pulmonary artery enlargement. Prior CABG  and AVR. New loop recorder noted. Normal pulmonary vascularity. Chronic mild elevation of the right hemidiaphragm with right basilar atelectasis/scarring and scarring at the right costophrenic angle. No pleural effusion or pneumothorax. No acute osseous abnormality. IMPRESSION: 1. No acute cardiopulmonary disease. Electronically Signed    By: Obie Dredge M.D.   On: 03/12/2023 12:02   CUP PACEART REMOTE DEVICE CHECK  Result Date: 02/25/2023 ILR summary report received. Battery status OK. Normal device function. No new symptom, tachy, brady, or pause episodes. No new AF episodes.  AF burden is 0% of the time.   Monthly summary reports and ROV/PRN ML, CVRS    Time coordinating discharge: Over 30 minutes    Lewie Chamber, MD  [facility] 03/16/2023, 1:50 PM

## 2023-03-16 NOTE — Progress Notes (Deleted)
Cardiology Office Note:    Date:  03/16/2023   ID:  Whitney [name], DOB February 19, 1956, MRN [id number]  PCP:  Daisy Floro, MD  Cardiologist:  Olga Millers, MD { Click to update primary MD,subspecialty MD or APP then REFRESH:1}    Referring MD: Daisy Floro, MD   Chief Complaint: hospital follow-up of acute hypoxic respiratory failure  History of Present Illness:    Whitney [name] is a 67 y.o. female with a history of minimal non-obstructive CAD on cardiac catheterization in 08/2017, severe aortic stenosis s/p AVR in 12/2017,  paroxysmal atrial fibrillation on Eliquis,  chronic dyspnea felt to be multifactorial, hyperlipidemia, type 2 diabetes mellitus, CVA in 04/2022 s/p loop recorder, COPD, obstructive sleep apnea on CPAP, NASH with chronic thrombocytopenia followed at [facility], fibromyalgia, Sjogren's syndrome, and obesity s/p gastric sleeve surgery in 2015 who is followed by Dr. Jens Som and presents today for hospital follow-up of acute hypoxic respiratory failure.   Patient has a history of severe aortic stenosis. Echo in 08/2017 showed normal LV function with severe aortic stenosis and moderate aortic insufficiency (mean gradient 41 mmHg). R/ LHC at that time showed minimal CAD with only 10% stenosis of mid LAD as well as severe aortic stenosis. She ultimately underwent surgical AVR in 12/2017. She did have some brief runs of atrial fibrillation post-operatively but chemically converted back to sinus rhythm with Amiodarone and Lopressor. 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 She continued to have intermittent shortness of breath following this. CPX in 02/2020 showed no obvious cardiac limitation. Resting PFTs showed severe obstructive/ restrictive lung disease. Overall limitation was felt to be due to severe obesity and related restrictive/ obstructive lung disease. She was referred back to Pulmonary  Rehab.   Monitor was ordered in 10/2020 due to reports of irregular heart rate on home BP machine. Monitor showed short runs of SVT and rare PACs/ PVCs but no atrial fibrillation.   She was admitted for a stroke in 04/2022. Echo with bubble study at that time showed LVEF of 60-65% with normal wall motion and grade 2 diastolic dysfunction, normal RV size and function, severe biatrial enlargement, s/p bioprosthetic AVR with trivial perivalvular aortic valve regurgitation and mild aortic stenosis (mean gradient of 21 mmHg), mild MR, and mild dilatation of the ascending aorta measuring 41 mmHg. There was no atrial level shunt noted on bubble study. She had a loop recorder placed at that time which subsequently showed paroxysmal atrial fibrillation. He was started on Eliquis.  Patient was recently admitted from 03/13/2023 to 03/16/2023 for acute hypoxic respiratory failure after presenting with generalized fatigue and malaise with some associated shortness of breath. She was treated with antibiotics, steroids, and nebulizers. BNP was elevated in the 600s so she also received a few dose of IV Lasix. Echo showed LVEF of 65-70% with moderate LVH, mildly reduced RV function with severely elevated PASP of 68 mmHg, mild MR, and s/p AVR with findings most consistent with prosthesis patient mismatch with mildly elevated gradient. She was also noted to have mildly elevated troponin which peaked at 75 for which was felt to be due to demand ischemia. Dyspnea was felt to be multifactorial and pulmonary hypertension was felt to likely be due to a combination of pulmonary venous hypertension, valvular heart disease, and COPD/ right hemidiaphragm paralysis. She was not felt to need any diuretics at discharge and plan was for outpatient CPX  if dyspnea persists.    Patient presents today for follow-up. ***  Chronic Dyspnea Pulmonary Hypertension Patient has a history of chronic dyspnea which has previously not been felt to be  cardiac in nature. Prior CPX in 02/2020 showed no obvious cardiac limitation. Resting PFTs showed severe obstructive/ restrictive lung disease. Overall limitation was felt to be due to severe obesity and related restrictive/ obstructive lung disease. She was recently admitted for acute hypoxic respiratory failure. Dyspnea was felt to be multifactorial. She was treated with antibiotics, steroids, and nebulizers. She was also treated with a few doses of IV Lasix for possible volume overload but this was not felt to be the major issue. Echo showed LVEF of 65-70% with moderate LVH, mildly reduced RV function with severely elevated PASP of 68 mmHg, mild MR, and s/p AVR with findings most consistent with prosthesis patient mismatch with mildly elevated gradient. Pulmonary hypertension was felt to likely be due to a combination of pulmonary venous hypertension, valvular heart disease, and COPD/ right hemidiaphragm paralysis. - *** - Also followed by Pulmonology.   Minimal Non-Obstructive CAD LHC in 08/2017 prior to valve surgery showed mild CAD with only 10% stenosis of mid LAD.  - No chest pain.  - No aspirin due to need for DOAC.  - Continue Zetia 10mg  daily.   Paroxysmal Atrial Fibrillation Maintaining sinus rhythm on exam.  - Continue Nadolol 40mg  daily.  - Continue Eliquis 5mg  twice daily   Severe Aortic Stenosis s/p AVR S/p AVR in 12/2017. Echo on 03/14/2023 during recent admission showed findings most consistent with prosthesis patient mismatch with mildly elevated gradient. Mean gradient 23.7 mmHg.  - Continue to monitor with routine serial Echos.   Hyperlipidemia Lipid panel in 04/2022: Total Cholesterol 140, Triglycerides 70, HDL 29, LDL 97. LDL goal <70 given CAD and CAD>  - Not felt to be a candidate for statins due to NASH.  - Continue Zetia 10mg  daily. ***  Type 2 Diabetes Mellitus Hemoglobin A1c 5.3% on 03/14/2023.  - Management per PCP.   Obstructive Sleep Apnea - Continue CPAP. -  Followed by Pulmonology.    EKGs/Labs/Other Studies Reviewed:    The following studies were reviewed:  Right/ Left Cardiac Catheterization 08/23/2017: Mid LAD lesion is 10% stenosed. The left ventricular systolic function is normal. LV end diastolic pressure is normal. The left ventricular ejection fraction is 55-65% by visual estimate. There is severe aortic valve stenosis. Ao sat 99%, PA sat 79%, mean PA 17 mm Hg; mean PCWP 10 mm Hg; CO 6.8 L/min; CI 3.5 Calculated aortic valve area 0.96 cm2.   No significant CAD.  Severe aortic stenosis.  Continue with plan for surgical consultation.   Diagnostic Dominance: Right   _______________  CPX 02/15/2020: Summary: Overall, mild functional limitation when compared to sedentary norms that normalizes when corrected for patient's obesity. There is no obvious cardiac limitiation. Resting PFTs reveal severe obstructive/restrictive lung disease. At peak exercise there was a hypertensive response and O2 sats dropped to 88%.   Overall limitation felt due to severe obesity and related restrictive/obstuctive lung disease. Recommend weight loss and referral to Pulmonary.  _______________  Monitor 6/232/2023 to 11/10/2021: Patient had a min HR of 47 bpm, max HR of 158 bpm, and avg HR of 67 bpm. Predominant underlying rhythm was Sinus Rhythm. 9 Supraventricular Tachycardia runs occurred, the run with the fastest interval lasting 4 beats with a max rate of 158 bpm, the  longest lasting 19 beats with an avg rate of 112  bpm. Isolated SVEs were rare (<1.0%), SVE Couplets were rare (<1.0%), and SVE Triplets were rare (<1.0%). Isolated VEs were rare (<1.0%), VE Couplets were rare (<1.0%), and no VE Triplets were present.   Normal sinus rhythm with rare PACs and PVCs. Short runs of PACs which were not assocaited with symptoms. No symptoms reported. No atrial fibrillation.  _______________  Echocardiogram 03/14/2023: Impressions: 1. Left ventricular  ejection fraction, by estimation, is 65 to 70%. The  left ventricle has normal function. The left ventricle has no regional  wall motion abnormalities. There is moderate concentric left ventricular  hypertrophy. Left ventricular  diastolic function could not be evaluated.   2. Right ventricular systolic function is mildly reduced. The right  ventricular size is normal. There is severely elevated pulmonary artery  systolic pressure. The estimated right ventricular systolic pressure is  68.0 mmHg.   3. Left atrial size was severely dilated.   4. Right atrial size was mildly dilated.   5. Exuberant mitral annular calcification. The mitral valve is  degenerative. Mild mitral valve regurgitation. Mild mitral stenosis. The  mean mitral valve gradient is 4.5 mmHg with average heart rate of 77 bpm.  Severe mitral annular calcification.   6. The tricuspid valve is abnormal. Tricuspid valve regurgitation is  moderate to severe.   7. Findings most consistent with prosthesis patient mismatch with mildly  elevated gradients that have increased slightly compared to prior exam. No  significant prosthetic aortic valve stenosis. The aortic valve has been  repaired/replaced. Aortic valve  regurgitation is trivial and appears central. There is a 21 mm Edwards  Inspiris Resilia valve present in the aortic position. Procedure Date:  12/05/17. Aortic valve area, by VTI measures 1.45 cm. Aortic valve mean  gradient measures 23.7 mmHg. Aortic valve   Vmax measures 3.19 m/s. Aortic valve acceleration time measures 98 msec.   8. The inferior vena cava is dilated in size with <50% respiratory  variability, suggesting right atrial pressure of 15 mmHg.    EKG:  EKG not ordered today.   Recent Labs: 03/13/2023: ALT 12; B Natriuretic Peptide 694.6; Pro B Natriuretic peptide (BNP) 996.0 03/14/2023: Hemoglobin 14.2; Platelets 103; TSH 1.843 03/16/2023: BUN 36; Creatinine, Ser 1.05; Magnesium 2.3; Potassium 5.0; Sodium  139  Recent Lipid Panel    Component Value Date/Time   CHOL 140 04/14/2022 0242   TRIG 70 04/14/2022 0242   HDL 29 (L) 04/14/2022 0242   CHOLHDL 4.8 04/14/2022 0242   VLDL 14 04/14/2022 0242   LDLCALC 97 04/14/2022 0242    Physical Exam:    Vital Signs: There were no vitals taken for this visit.    Wt Readings from Last 3 Encounters:  03/13/23 230 lb 9.6 oz (104.6 kg)  03/12/23 230 lb 9.6 oz (104.6 kg)  01/31/23 223 lb (101.2 kg)     General: 67 y.o. female in no acute distress. HEENT: Normocephalic and atraumatic. Sclera clear.  Neck: Supple. No carotid bruits. No JVD. Heart: *** RRR. Distinct S1 and S2. No murmurs, gallops, or rubs.  Lungs: No increased work of breathing. Clear to ausculation bilaterally. No wheezes, rhonchi, or rales.  Abdomen: Soft, non-distended, and non-tender to palpation.  Extremities: No lower extremity edema.  Radial and distal pedal pulses 2+ and equal bilaterally. Skin: Warm and dry. Neuro: No focal deficits. Psych: Normal affect. Responds appropriately.   Assessment:    No diagnosis found.  Plan:     Disposition: Follow up in ***   Signed, Corrin Parker,  PA-C  03/16/2023 6:28 PM    [facility]

## 2023-03-16 NOTE — Progress Notes (Signed)
Mobility Specialist Progress Note    03/16/23 1036  Mobility  Activity Ambulated with assistance in hallway  Level of Assistance Contact guard assist, steadying assist  Assistive Device Four wheel walker  Distance Ambulated (ft) 350 ft  Activity Response Tolerated fair  Mobility Referral Yes  $Mobility charge 1 Mobility  Mobility Specialist Start Time (ACUTE ONLY) 1016  Mobility Specialist Stop Time (ACUTE ONLY) 1035  Mobility Specialist Time Calculation (min) (ACUTE ONLY) 19 min   Post-Mobility: 72 HR, 96% SpO2  Pt received in bathroom and agreeable. On RA, SpO2 in low 80s. Pleth unreliable and fingers were cold so moved sensor to earlobe. With good pleth and on 4LO2 SpO2 89-92%. Pt c/o some dizziness and feeling a little winded. Returned to sitting EOB. Encouraged pursed lip breathing. On 2LO2 in room. RN notified.   [place] Mobility Specialist  Please Neurosurgeon or Rehab Office at [phone]

## 2023-03-17 ENCOUNTER — Emergency Department (HOSPITAL_COMMUNITY): Payer: Medicare PPO

## 2023-03-17 ENCOUNTER — Emergency Department (HOSPITAL_COMMUNITY)
Admission: EM | Admit: 2023-03-17 | Discharge: 2023-03-18 | Disposition: A | Discharge status: Home / Self Care | Payer: Medicare PPO | Attending: Emergency Medicine | Admitting: Emergency Medicine

## 2023-03-17 ENCOUNTER — Encounter (HOSPITAL_COMMUNITY): Payer: Self-pay | Admitting: Emergency Medicine

## 2023-03-17 ENCOUNTER — Other Ambulatory Visit: Payer: Self-pay

## 2023-03-17 DIAGNOSIS — R9082 White matter disease, unspecified: Secondary | ICD-10-CM | POA: Diagnosis not present

## 2023-03-17 DIAGNOSIS — R0602 Shortness of breath: Secondary | ICD-10-CM | POA: Diagnosis not present

## 2023-03-17 DIAGNOSIS — R29818 Other symptoms and signs involving the nervous system: Secondary | ICD-10-CM | POA: Diagnosis not present

## 2023-03-17 DIAGNOSIS — E119 Type 2 diabetes mellitus without complications: Secondary | ICD-10-CM | POA: Diagnosis not present

## 2023-03-17 DIAGNOSIS — I6782 Cerebral ischemia: Secondary | ICD-10-CM | POA: Diagnosis not present

## 2023-03-17 DIAGNOSIS — Z7901 Long term (current) use of anticoagulants: Secondary | ICD-10-CM | POA: Diagnosis not present

## 2023-03-17 DIAGNOSIS — R5383 Other fatigue: Secondary | ICD-10-CM | POA: Diagnosis not present

## 2023-03-17 DIAGNOSIS — G9389 Other specified disorders of brain: Secondary | ICD-10-CM | POA: Diagnosis not present

## 2023-03-17 DIAGNOSIS — I6523 Occlusion and stenosis of bilateral carotid arteries: Secondary | ICD-10-CM | POA: Diagnosis not present

## 2023-03-17 DIAGNOSIS — R001 Bradycardia, unspecified: Secondary | ICD-10-CM | POA: Diagnosis not present

## 2023-03-17 LAB — COMPREHENSIVE METABOLIC PANEL
ALT: 15 U/L (ref 0–44)
AST: 27 U/L (ref 15–41)
Albumin: 2.4 g/dL — ABNORMAL LOW (ref 3.5–5.0)
Alkaline Phosphatase: 53 U/L (ref 38–126)
Anion gap: 6 (ref 5–15)
BUN: 35 mg/dL — ABNORMAL HIGH (ref 8–23)
CO2: 31 mmol/L (ref 22–32)
Calcium: 9 mg/dL (ref 8.9–10.3)
Chloride: 103 mmol/L (ref 98–111)
Creatinine, Ser: 0.92 mg/dL (ref 0.44–1.00)
GFR, Estimated: >60 mL/min (ref >60)
Glucose, Bld: 109 mg/dL — ABNORMAL HIGH (ref 70–99)
Potassium: 3.9 mmol/L (ref 3.5–5.1)
Sodium: 140 mmol/L (ref 135–145)
Total Bilirubin: 1 mg/dL (ref <1.2)
Total Protein: 5.9 g/dL — ABNORMAL LOW (ref 6.5–8.1)

## 2023-03-17 LAB — CBC
HCT: 45.4 % (ref 36.0–46.0)
Hemoglobin: 13.8 g/dL (ref 12.0–15.0)
MCH: 30 pg (ref 26.0–34.0)
MCHC: 30.4 g/dL (ref 30.0–36.0)
MCV: 98.7 fL (ref 80.0–100.0)
Platelets: 66 10*3/uL — ABNORMAL LOW (ref 150–400)
RBC: 4.6 MIL/uL (ref 3.87–5.11)
RDW: 14.4 % (ref 11.5–15.5)
WBC: 5.5 10*3/uL (ref 4.0–10.5)
nRBC: 0 % (ref 0.0–0.2)

## 2023-03-17 LAB — DIFFERENTIAL
Abs Immature Granulocytes: 0.02 10*3/uL (ref 0.00–0.07)
Basophils Absolute: 0 10*3/uL (ref 0.0–0.1)
Basophils Relative: 0 %
Eosinophils Absolute: 0.1 10*3/uL (ref 0.0–0.5)
Eosinophils Relative: 1 %
Immature Granulocytes: 0 %
Lymphocytes Relative: 32 %
Lymphs Abs: 1.7 10*3/uL (ref 0.7–4.0)
Monocytes Absolute: 0.8 10*3/uL (ref 0.1–1.0)
Monocytes Relative: 14 %
Neutro Abs: 2.9 10*3/uL (ref 1.7–7.7)
Neutrophils Relative %: 53 %

## 2023-03-17 LAB — PROTIME-INR
INR: 1.4 — ABNORMAL HIGH (ref 0.8–1.2)
Prothrombin Time: 17.4 s — ABNORMAL HIGH (ref 11.4–15.2)

## 2023-03-17 LAB — AMMONIA: Ammonia: 21 umol/L (ref 9–35)

## 2023-03-17 LAB — APTT: aPTT: 33 s (ref 24–36)

## 2023-03-17 NOTE — Discharge Instructions (Signed)
The blood test CT scan and MRI were reassuring.  No signs of any acute stroke.  Continue your medications.  Follow-up with your neurologist to be rechecked.

## 2023-03-17 NOTE — ED Notes (Signed)
Charge RN called to room, pt very unhappy w/ all staff from the people who changed her medication to the "people who are ignoring" her.  She spoke about the changes to her personality and how people do not believe her anymore.  RN reassured pt that she was OK and that we were taking care of her medical issues.  She thanked Charity fundraiser for listening to her.

## 2023-03-17 NOTE — ED Notes (Signed)
Pt at MRI

## 2023-03-17 NOTE — ED Notes (Signed)
RN called back to room b/c she was not going to call for a ride until all her issues have been "handled."  RN explained that she needed to speak to her PCP about medication changes and concerns about her "personality and memory" issues as she might be able to help w/ that.  She then asked for this RN's name which was given.  RN wrote her name on the discharge papers incase she forgot when she got home b/c she had been referring to me by my name during the conversation.

## 2023-03-17 NOTE — ED Notes (Signed)
Patient transported to CT 

## 2023-03-17 NOTE — ED Notes (Signed)
Pt passed swallow screen and per MD pt is able to eat

## 2023-03-17 NOTE — ED Provider Notes (Signed)
[facility] AT [facility] Provider Note   CSN: [id number] Arrival date & time: 03/17/23  1730     History  Chief complaint Fatigue, speech difficulty  Whitney [name] is a 67 y.o. female.  HPI   Patient has a complicated medical history that includes paroxysmal atrial fibrillation on Eliquis aortic stenosis status post TAVR, Nash cirrhosis, depression, diabetes, fibromyalgia, Sjogren's syndrome, obstructive sleep apnea.  Patient was recently admitted to the hospital on November 6.  She was discharged on November 9.  Patient started having respiratory symptoms earlier in the week.  She saw her nurse practitioner at the pulmonary office.  Patient was treated for COPD exacerbation.  Patient was also noted to have TIA back in December and has had some residual balance issues since then.  Patient also did follow-up with her neurologist back in September of this year for neuropathic pain.  Patient has had MRIs that have showed scattered punctate infarcts of unclear etiology.  Patient states she has been having issues with her speech over the last couple months.  She was experiencing it also when she was in the hospital these last few days.  Patient states she does not think this was addressed.  She feels like the symptoms are worse today.  She came back to the ED for evaluation  Home Medications Prior to Admission medications   Medication Sig Start Date End Date Taking? Authorizing Provider  albuterol (PROVENTIL) (2.5 MG/3ML) 0.083% nebulizer solution Take 3 mLs (2.5 mg total) by nebulization every 6 (six) hours as needed for wheezing or shortness of breath. 03/12/23   Cobb, Ruby Cola, NP  albuterol (VENTOLIN HFA) 108 (90 Base) MCG/ACT inhaler INHALE 2 PUFFS INTO THE LUNGS EVERY 4 HOURS AS NEEDED FOR WHEEZING OR SHORTNESS OF BREATH. 04/17/22   Olalere, Adewale A, MD  amoxicillin-clavulanate (AUGMENTIN) 600-42.9 MG/5ML suspension Take 7.3 mLs (875 mg total) by mouth 2  (two) times daily for 5 days. 03/16/23 03/21/23  Lewie Chamber, MD  benzonatate (TESSALON) 200 MG capsule Take 1 capsule (200 mg total) by mouth 3 (three) times daily as needed for cough. 03/12/23   Cobb, Ruby Cola, NP  ELIQUIS 5 MG TABS tablet TAKE 1 TABLET BY MOUTH TWICE A DAY 02/15/23   Fenton, Clint R, PA  ezetimibe (ZETIA) 10 MG tablet Take 10 mg by mouth at bedtime.  09/17/16   [provider]  gabapentin (NEURONTIN) 100 MG capsule Take 1 capsule (100 mg total) by mouth 2 (two) times daily at 10 am and 4 pm AND 3 capsules (300 mg total) at bedtime. 01/31/23   Ihor Austin, NP  milk thistle 175 MG tablet Take 175 mg by mouth See admin instructions. Take one tablet by mouth twice a week    [provider]  nadolol (CORGARD) 40 MG tablet TAKE 1 TABLET BY MOUTH EVERY DAY 11/26/22   Corky Crafts, MD  OZEMPIC, 0.25 OR 0.5 MG/DOSE, 2 MG/3ML SOPN Inject 2 mg into the skin once a week. 09/21/21   [provider]  predniSONE (DELTASONE) 10 MG tablet Take 4 tablets by mouth daily for 3 days, then 3 tabs daily for 3 days, then 2 tabs daily for 3 days, then 1 tab daily for 3 days 03/16/23   Lewie Chamber, MD  promethazine-dextromethorphan (PROMETHAZINE-DM) 6.25-15 MG/5ML syrup Take 5 mLs by mouth 4 (four) times daily as needed. Patient taking differently: Take 5 mLs by mouth 4 (four) times daily as needed for cough. 03/12/23  Cobb, Ruby Cola, NP  Tiotropium Bromide-Olodaterol (STIOLTO RESPIMAT) 2.5-2.5 MCG/ACT AERS INHALE 2 PUFFS INTO THE LUNGS DAILY. INHALE 2 PUFFS BY MOUTH INTO THE LUNGS DAILY 03/07/23   Leslye Peer, MD      Allergies    Doxycycline and Naproxen    Review of Systems   Review of Systems  Physical Exam Updated Vital Signs BP 130/79   Pulse 69   Temp 98.6 F (37 C) (Oral)   Resp (!) 29   Ht 1.626 m (5\' 4" )   Wt 99.8 kg   SpO2 98%   BMI 37.76 kg/m  Physical Exam Vitals and nursing note reviewed.  Constitutional:      General: She is  not in acute distress.    Appearance: She is well-developed.  HENT:     Head: Normocephalic and atraumatic.     Right Ear: External ear normal.     Left Ear: External ear normal.  Eyes:     General: No visual field deficit or scleral icterus.       Right eye: No discharge.        Left eye: No discharge.     Conjunctiva/sclera: Conjunctivae normal.  Neck:     Trachea: No tracheal deviation.  Cardiovascular:     Rate and Rhythm: Normal rate and regular rhythm.  Pulmonary:     Effort: Pulmonary effort is normal. No respiratory distress.     Breath sounds: Normal breath sounds. No stridor. No wheezing or rales.  Abdominal:     General: Bowel sounds are normal. There is no distension.     Palpations: Abdomen is soft.     Tenderness: There is no abdominal tenderness. There is no guarding or rebound.  Musculoskeletal:        General: No tenderness.     Cervical back: Neck supple.  Skin:    General: Skin is warm and dry.     Findings: No rash.  Neurological:     Mental Status: She is alert and oriented to person, place, and time.     Cranial Nerves: No cranial nerve deficit, dysarthria or facial asymmetry.     Sensory: No sensory deficit.     Motor: No abnormal muscle tone, seizure activity or pronator drift.     Coordination: Coordination normal.     Comments:  able to hold both legs off bed for 5 seconds, sensation intact in all extremities,  no left or right sided neglect, normal finger-nose exam bilaterally, no nystagmus noted, no word finding difficulties noted, no aphasia no dysarthria   Psychiatric:        Mood and Affect: Mood normal.     ED Results / Procedures / Treatments   Labs (all labs ordered are listed, but only abnormal results are displayed) Labs Reviewed  PROTIME-INR - Abnormal; Notable for the following components:      Result Value   Prothrombin Time 17.4 (*)    INR 1.4 (*)    All other components within normal limits  CBC - Abnormal; Notable for the  following components:   Platelets 66 (*)    All other components within normal limits  COMPREHENSIVE METABOLIC PANEL - Abnormal; Notable for the following components:   Glucose, Bld 109 (*)    BUN 35 (*)    Total Protein 5.9 (*)    Albumin 2.4 (*)    All other components within normal limits  APTT  DIFFERENTIAL  AMMONIA    EKG EKG Interpretation Date/Time:  Sunday March 17 2023 18:03:57 EST Ventricular Rate:  59 PR Interval:  169 QRS Duration:  114 QT Interval:  476 QTC Calculation: 472 R Axis:   86  Text Interpretation: Sinus rhythm Incomplete right bundle branch block Anterior infarct, old No significant change since last tracing Confirmed by Linwood Dibbles [phone]) on 03/17/2023 6:11:54 PM  Radiology MR BRAIN WO CONTRAST  Result Date: 03/17/2023 CLINICAL DATA:  Stroke suspected EXAM: MRI HEAD WITHOUT CONTRAST TECHNIQUE: Multiplanar, multiecho pulse sequences of the brain and surrounding structures were obtained without intravenous contrast. COMPARISON:  05/23/2022 MRI head, correlation is also made with 03/17/2023 CT head FINDINGS: Brain: No restricted diffusion to suggest acute or subacute infarct. No acute hemorrhage, mass, mass effect, or midline shift. No hydrocephalus or extra-axial collection. Partial empty sella. Craniocervical junction within normal limits. No hemosiderin deposition to suggest remote hemorrhage. Normal cerebral volume. Scattered T2 hyperintense signal in the periventricular white matter, likely the sequela of mild chronic small vessel ischemic disease. Vascular: Normal arterial flow voids. Skull and upper cervical spine: Normal marrow signal. Sinuses/Orbits: Clear paranasal sinuses. No acute finding in the orbits. Other: Trace fluid in right mastoid air cells. IMPRESSION: No acute intracranial process. No evidence of acute or subacute infarct. Electronically Signed   By: Wiliam Ke M.D.   On: 03/17/2023 22:39   CT HEAD WO CONTRAST  Result Date:  03/17/2023 CLINICAL DATA:  Fatigue and speech issues. EXAM: CT HEAD WITHOUT CONTRAST TECHNIQUE: Contiguous axial images were obtained from the base of the skull through the vertex without intravenous contrast. RADIATION DOSE REDUCTION: This exam was performed according to the departmental dose-optimization program which includes automated exposure control, adjustment of the mA and/or kV according to patient size and/or use of iterative reconstruction technique. COMPARISON:  May 23, 2022 FINDINGS: Brain: There is mild cerebral atrophy with widening of the extra-axial spaces and ventricular dilatation. There are areas of decreased attenuation within the white matter tracts of the supratentorial brain, consistent with microvascular disease changes. Vascular: Marked severity bilateral cavernous carotid artery calcification is seen. Skull: Normal. Negative for fracture or focal lesion. Sinuses/Orbits: There is mild right ethmoid sinus mucosal thickening. A 7 mm left ethmoid sinus osteoma is also noted. Other: None. IMPRESSION: 1. Generalized cerebral atrophy with chronic white matter small vessel ischemic changes. 2. No acute intracranial abnormality. 3. Mild right ethmoid sinus disease. Electronically Signed   By: Aram Candela M.D.   On: 03/17/2023 19:51    Procedures Procedures    Medications Ordered in ED Medications - No data to display  ED Course/ Medical Decision Making/ A&P Clinical Course as of 03/17/23 2322  Sun Mar 17, 2023  1929 Ammonia level normal.  CBC metabolic panel unremarkable [JK]  1954 Head CT does not show any acute abnormality. [JK]  2255 MRI without acute abnormality. [JK]    Clinical Course User Index [JK] Linwood Dibbles, MD                                 Medical Decision Making Differential diagnosis includes but not limited to stroke electrolyte abnormality metabolic encephalopathy related to her cirrhosis  Problems Addressed: Other fatigue: acute illness or  injury that poses a threat to life or bodily functions  Amount and/or Complexity of Data Reviewed Labs: ordered. Decision-making details documented in ED Course. Radiology: ordered and independent interpretation performed.   Patient recently in the hospital.  She states she had been feeling  very fatigued.  She was concerned about having difficulty with her speech earlier.  Patient does not have any neurologic deficits on my exam.  She has normal speech.  No confusion.  Her laboratory test did not show any elevated ammonia level.  No significant electrolyte abnormalities.  CT and MRI does not show any signs of acute stroke or hemorrhage.  Patient's neurologic exam is reassuring.  Her fatigue may be multifactorial.  Evaluation and diagnostic testing in the emergency department does not suggest an emergent condition requiring admission or immediate intervention beyond what has been performed at this time.  The patient is safe for discharge and has been instructed to return immediately for worsening symptoms, change in symptoms or any other concerns.         Final Clinical Impression(s) / ED Diagnoses Final diagnoses:  Other fatigue    Rx / DC Orders ED Discharge Orders     None         Linwood Dibbles, MD 03/17/23 2322

## 2023-03-17 NOTE — ED Triage Notes (Signed)
Pt BIB [facility] from home due to fatigue.  Pt was discharged yesterday due to PNA and was placed on 3L oxygen.  Pt reports she has been having difficulty with speech the last couple months and today seems worse.  Pt does have liver failure.  VSS.

## 2023-03-18 LAB — CULTURE, BLOOD (ROUTINE X 2)
Culture: NO GROWTH
Culture: NO GROWTH
Special Requests: ADEQUATE

## 2023-03-18 NOTE — ED Notes (Signed)
PT HAS BEEN DISCHARGED BUT WILL REMAIN IN BED PER DR KNAPP. IS ATTEMPTING TO FIND RIDE.

## 2023-03-18 NOTE — Telephone Encounter (Signed)
Attempted to call patient.  Left message on VM for patient to call clinic to verify that [employer] did deliver her oxygen (as patient was discharged from the hospital on 03/16/2023) and reminder that she has an appointment with Rhunette Croft, NP on 03/20/2023 at 11:00 am.  Whitney [name] New with [employer].  He was in contact with Whitney [name].  She is with [employer] and works thru the hospital.  She contacted the patient while in the hospital and verified all orders.  These items were delivered to patients home on 03/15/2023: 1.  Home compressor and Cylinder with cart 2.  Home fill system and D size tanks 3.  Oxygen back up tanks 4.  Nebulizer system 5.  Rolator walker

## 2023-03-19 ENCOUNTER — Telehealth: Payer: Self-pay | Admitting: Adult Health

## 2023-03-19 DIAGNOSIS — M797 Fibromyalgia: Secondary | ICD-10-CM | POA: Diagnosis not present

## 2023-03-19 DIAGNOSIS — R5383 Other fatigue: Secondary | ICD-10-CM | POA: Diagnosis not present

## 2023-03-19 DIAGNOSIS — I480 Paroxysmal atrial fibrillation: Secondary | ICD-10-CM | POA: Diagnosis not present

## 2023-03-19 DIAGNOSIS — E1140 Type 2 diabetes mellitus with diabetic neuropathy, unspecified: Secondary | ICD-10-CM | POA: Diagnosis not present

## 2023-03-19 DIAGNOSIS — J4489 Other specified chronic obstructive pulmonary disease: Secondary | ICD-10-CM | POA: Diagnosis not present

## 2023-03-19 DIAGNOSIS — I088 Other rheumatic multiple valve diseases: Secondary | ICD-10-CM | POA: Diagnosis not present

## 2023-03-19 DIAGNOSIS — K7460 Unspecified cirrhosis of liver: Secondary | ICD-10-CM | POA: Diagnosis not present

## 2023-03-19 DIAGNOSIS — J9601 Acute respiratory failure with hypoxia: Secondary | ICD-10-CM | POA: Diagnosis not present

## 2023-03-19 DIAGNOSIS — K7581 Nonalcoholic steatohepatitis (NASH): Secondary | ICD-10-CM | POA: Diagnosis not present

## 2023-03-19 DIAGNOSIS — D649 Anemia, unspecified: Secondary | ICD-10-CM | POA: Diagnosis not present

## 2023-03-19 NOTE — Telephone Encounter (Signed)
Pt's son [name]. Pt was in hospital on 03/17/23 because disorientated, confused symptoms are continuous. Have had CAT Scan of head and MRI.  Vitals are ok but her mind is not.  Informed Mr. Yetta Barre would need a referral sent over. Mr. Yetta Barre will call general practitioner to send over a referral.

## 2023-03-20 ENCOUNTER — Ambulatory Visit: Payer: Medicare PPO | Admitting: Nurse Practitioner

## 2023-03-20 NOTE — Telephone Encounter (Signed)
Noted, pt should first be evaluated for new concerns with PCP post ER visit.

## 2023-03-21 ENCOUNTER — Ambulatory Visit: Payer: Medicare PPO | Admitting: Emergency Medicine

## 2023-03-21 ENCOUNTER — Telehealth: Payer: Self-pay | Admitting: Nurse Practitioner

## 2023-03-21 NOTE — Telephone Encounter (Signed)
Patient not able to make appointment today with Dr. Delton Coombes. No sooner appointments until January 2025. Does patient need to be overbooked again. Patient phone number is [phone].

## 2023-03-21 NOTE — Telephone Encounter (Signed)
No, she needs to keep her appointment with Dr. Delton Coombes as scheduled otherwise no openings until January.

## 2023-03-22 DIAGNOSIS — Z6841 Body Mass Index (BMI) 40.0 and over, adult: Secondary | ICD-10-CM | POA: Diagnosis not present

## 2023-03-22 DIAGNOSIS — M791 Myalgia, unspecified site: Secondary | ICD-10-CM | POA: Diagnosis not present

## 2023-03-22 DIAGNOSIS — B379 Candidiasis, unspecified: Secondary | ICD-10-CM | POA: Diagnosis not present

## 2023-03-22 DIAGNOSIS — Z952 Presence of prosthetic heart valve: Secondary | ICD-10-CM | POA: Diagnosis not present

## 2023-03-22 DIAGNOSIS — M3500 Sicca syndrome, unspecified: Secondary | ICD-10-CM | POA: Diagnosis not present

## 2023-03-22 DIAGNOSIS — R410 Disorientation, unspecified: Secondary | ICD-10-CM | POA: Diagnosis not present

## 2023-03-22 DIAGNOSIS — R0902 Hypoxemia: Secondary | ICD-10-CM | POA: Diagnosis not present

## 2023-03-22 DIAGNOSIS — Z23 Encounter for immunization: Secondary | ICD-10-CM | POA: Diagnosis not present

## 2023-03-22 DIAGNOSIS — Z09 Encounter for follow-up examination after completed treatment for conditions other than malignant neoplasm: Secondary | ICD-10-CM | POA: Diagnosis not present

## 2023-03-22 NOTE — Telephone Encounter (Signed)
Agree, would not overbook

## 2023-03-22 NOTE — Telephone Encounter (Signed)
ATC x1 LVM for patient to cal our office back regarding prior message.

## 2023-03-25 ENCOUNTER — Ambulatory Visit: Payer: Medicare PPO | Attending: Student | Admitting: Student

## 2023-03-25 DIAGNOSIS — I480 Paroxysmal atrial fibrillation: Secondary | ICD-10-CM | POA: Diagnosis not present

## 2023-03-25 DIAGNOSIS — J4489 Other specified chronic obstructive pulmonary disease: Secondary | ICD-10-CM | POA: Diagnosis not present

## 2023-03-25 DIAGNOSIS — I088 Other rheumatic multiple valve diseases: Secondary | ICD-10-CM | POA: Diagnosis not present

## 2023-03-25 DIAGNOSIS — K7460 Unspecified cirrhosis of liver: Secondary | ICD-10-CM | POA: Diagnosis not present

## 2023-03-25 DIAGNOSIS — J9601 Acute respiratory failure with hypoxia: Secondary | ICD-10-CM | POA: Diagnosis not present

## 2023-03-25 DIAGNOSIS — E1140 Type 2 diabetes mellitus with diabetic neuropathy, unspecified: Secondary | ICD-10-CM | POA: Diagnosis not present

## 2023-03-25 DIAGNOSIS — K7581 Nonalcoholic steatohepatitis (NASH): Secondary | ICD-10-CM | POA: Diagnosis not present

## 2023-03-25 DIAGNOSIS — D649 Anemia, unspecified: Secondary | ICD-10-CM | POA: Diagnosis not present

## 2023-03-25 DIAGNOSIS — M797 Fibromyalgia: Secondary | ICD-10-CM | POA: Diagnosis not present

## 2023-03-25 NOTE — Telephone Encounter (Signed)
LVMTCB to schedule appointment from prior message.

## 2023-03-27 DIAGNOSIS — I480 Paroxysmal atrial fibrillation: Secondary | ICD-10-CM | POA: Diagnosis not present

## 2023-03-27 DIAGNOSIS — M797 Fibromyalgia: Secondary | ICD-10-CM | POA: Diagnosis not present

## 2023-03-27 DIAGNOSIS — I088 Other rheumatic multiple valve diseases: Secondary | ICD-10-CM | POA: Diagnosis not present

## 2023-03-27 DIAGNOSIS — D649 Anemia, unspecified: Secondary | ICD-10-CM | POA: Diagnosis not present

## 2023-03-27 DIAGNOSIS — K7581 Nonalcoholic steatohepatitis (NASH): Secondary | ICD-10-CM | POA: Diagnosis not present

## 2023-03-27 DIAGNOSIS — J9601 Acute respiratory failure with hypoxia: Secondary | ICD-10-CM | POA: Diagnosis not present

## 2023-03-27 DIAGNOSIS — K7460 Unspecified cirrhosis of liver: Secondary | ICD-10-CM | POA: Diagnosis not present

## 2023-03-27 DIAGNOSIS — E1140 Type 2 diabetes mellitus with diabetic neuropathy, unspecified: Secondary | ICD-10-CM | POA: Diagnosis not present

## 2023-03-27 DIAGNOSIS — J4489 Other specified chronic obstructive pulmonary disease: Secondary | ICD-10-CM | POA: Diagnosis not present

## 2023-04-01 ENCOUNTER — Ambulatory Visit (INDEPENDENT_AMBULATORY_CARE_PROVIDER_SITE_OTHER): Payer: Medicare PPO

## 2023-04-01 DIAGNOSIS — I480 Paroxysmal atrial fibrillation: Secondary | ICD-10-CM

## 2023-04-01 LAB — CUP PACEART REMOTE DEVICE CHECK
Date Time Interrogation Session: 20241122230935
Implantable Pulse Generator Implant Date: 20231211

## 2023-04-02 ENCOUNTER — Telehealth: Payer: Self-pay | Admitting: Emergency Medicine

## 2023-04-02 NOTE — Telephone Encounter (Signed)
Pt wants to be seen sooner, stating she ended up in hospital been having trouble with breathing and walking

## 2023-04-03 NOTE — Telephone Encounter (Signed)
Dr. Delton Coombes already DENIED overbook. Leslye Peer, MD   03/22/23  1:54 PM Note Agree, would not overbook      Patient already scheduled Lauretta Grill A   03/27/23  9:04 AM Called Patient & got Pt sch for 01/23 @ 2:45pm w/ Dr. Delton Coombes   She can be added to cancellation list if she would like. Thanks!

## 2023-04-03 NOTE — Telephone Encounter (Signed)
Patient is very concerned that she cannot get a sooner appointment than 05/30/2023 with her doctor. Offered her an appointment with Buelah Manis on 12/5, patient denied because she states this something the doctor needs to address. I apologized and let patient know that Dr. Delton Coombes did not have any openings prior to her appt on 1/23. Patient cannot walk, she is on oxygen and feels that doctors are pushing her off. She states that she went to the hospital and hasn't had any answers as to why she is this way. Patient wants Dr. Delton Coombes to look at her blood work and give her answers. I did add patient to cancellation list. Please advise in the meantime.

## 2023-04-07 ENCOUNTER — Encounter (HOSPITAL_COMMUNITY): Payer: Self-pay

## 2023-04-07 ENCOUNTER — Other Ambulatory Visit: Payer: Self-pay

## 2023-04-07 ENCOUNTER — Inpatient Hospital Stay (HOSPITAL_COMMUNITY)
Admission: EM | Admit: 2023-04-07 | Discharge: 2023-04-14 | DRG: 287 | Disposition: A | Discharge status: Home Health | Payer: Medicare PPO | Attending: Internal Medicine | Admitting: Internal Medicine

## 2023-04-07 ENCOUNTER — Emergency Department (HOSPITAL_COMMUNITY): Payer: Medicare PPO

## 2023-04-07 DIAGNOSIS — Z7985 Long-term (current) use of injectable non-insulin antidiabetic drugs: Secondary | ICD-10-CM

## 2023-04-07 DIAGNOSIS — Z7901 Long term (current) use of anticoagulants: Secondary | ICD-10-CM | POA: Diagnosis not present

## 2023-04-07 DIAGNOSIS — D708 Other neutropenia: Secondary | ICD-10-CM | POA: Diagnosis present

## 2023-04-07 DIAGNOSIS — J90 Pleural effusion, not elsewhere classified: Secondary | ICD-10-CM | POA: Diagnosis not present

## 2023-04-07 DIAGNOSIS — R600 Localized edema: Secondary | ICD-10-CM

## 2023-04-07 DIAGNOSIS — I2723 Pulmonary hypertension due to lung diseases and hypoxia: Secondary | ICD-10-CM | POA: Diagnosis not present

## 2023-04-07 DIAGNOSIS — Z8249 Family history of ischemic heart disease and other diseases of the circulatory system: Secondary | ICD-10-CM

## 2023-04-07 DIAGNOSIS — I080 Rheumatic disorders of both mitral and aortic valves: Secondary | ICD-10-CM | POA: Diagnosis present

## 2023-04-07 DIAGNOSIS — R0989 Other specified symptoms and signs involving the circulatory and respiratory systems: Secondary | ICD-10-CM | POA: Diagnosis not present

## 2023-04-07 DIAGNOSIS — I270 Primary pulmonary hypertension: Secondary | ICD-10-CM | POA: Diagnosis not present

## 2023-04-07 DIAGNOSIS — I350 Nonrheumatic aortic (valve) stenosis: Secondary | ICD-10-CM

## 2023-04-07 DIAGNOSIS — I5031 Acute diastolic (congestive) heart failure: Secondary | ICD-10-CM | POA: Diagnosis not present

## 2023-04-07 DIAGNOSIS — K7581 Nonalcoholic steatohepatitis (NASH): Secondary | ICD-10-CM | POA: Diagnosis present

## 2023-04-07 DIAGNOSIS — Z8701 Personal history of pneumonia (recurrent): Secondary | ICD-10-CM

## 2023-04-07 DIAGNOSIS — Z952 Presence of prosthetic heart valve: Secondary | ICD-10-CM | POA: Diagnosis not present

## 2023-04-07 DIAGNOSIS — Z886 Allergy status to analgesic agent status: Secondary | ICD-10-CM | POA: Diagnosis not present

## 2023-04-07 DIAGNOSIS — J810 Acute pulmonary edema: Secondary | ICD-10-CM

## 2023-04-07 DIAGNOSIS — D6959 Other secondary thrombocytopenia: Secondary | ICD-10-CM | POA: Diagnosis not present

## 2023-04-07 DIAGNOSIS — Z9981 Dependence on supplemental oxygen: Secondary | ICD-10-CM

## 2023-04-07 DIAGNOSIS — I480 Paroxysmal atrial fibrillation: Secondary | ICD-10-CM | POA: Diagnosis not present

## 2023-04-07 DIAGNOSIS — J4489 Other specified chronic obstructive pulmonary disease: Secondary | ICD-10-CM | POA: Diagnosis not present

## 2023-04-07 DIAGNOSIS — R601 Generalized edema: Secondary | ICD-10-CM | POA: Diagnosis not present

## 2023-04-07 DIAGNOSIS — E785 Hyperlipidemia, unspecified: Secondary | ICD-10-CM | POA: Diagnosis present

## 2023-04-07 DIAGNOSIS — M797 Fibromyalgia: Secondary | ICD-10-CM | POA: Diagnosis present

## 2023-04-07 DIAGNOSIS — Z881 Allergy status to other antibiotic agents status: Secondary | ICD-10-CM | POA: Diagnosis not present

## 2023-04-07 DIAGNOSIS — I10 Essential (primary) hypertension: Secondary | ICD-10-CM | POA: Diagnosis not present

## 2023-04-07 DIAGNOSIS — E119 Type 2 diabetes mellitus without complications: Secondary | ICD-10-CM

## 2023-04-07 DIAGNOSIS — Z825 Family history of asthma and other chronic lower respiratory diseases: Secondary | ICD-10-CM

## 2023-04-07 DIAGNOSIS — Z888 Allergy status to other drugs, medicaments and biological substances status: Secondary | ICD-10-CM

## 2023-04-07 DIAGNOSIS — K7469 Other cirrhosis of liver: Secondary | ICD-10-CM | POA: Diagnosis present

## 2023-04-07 DIAGNOSIS — Z87442 Personal history of urinary calculi: Secondary | ICD-10-CM

## 2023-04-07 DIAGNOSIS — R0600 Dyspnea, unspecified: Secondary | ICD-10-CM | POA: Diagnosis not present

## 2023-04-07 DIAGNOSIS — Z8673 Personal history of transient ischemic attack (TIA), and cerebral infarction without residual deficits: Secondary | ICD-10-CM | POA: Diagnosis not present

## 2023-04-07 DIAGNOSIS — E8770 Fluid overload, unspecified: Secondary | ICD-10-CM | POA: Diagnosis not present

## 2023-04-07 DIAGNOSIS — I110 Hypertensive heart disease with heart failure: Secondary | ICD-10-CM | POA: Diagnosis not present

## 2023-04-07 DIAGNOSIS — I509 Heart failure, unspecified: Secondary | ICD-10-CM | POA: Diagnosis not present

## 2023-04-07 DIAGNOSIS — K760 Fatty (change of) liver, not elsewhere classified: Secondary | ICD-10-CM | POA: Diagnosis not present

## 2023-04-07 DIAGNOSIS — Z820 Family history of epilepsy and other diseases of the nervous system: Secondary | ICD-10-CM

## 2023-04-07 DIAGNOSIS — D696 Thrombocytopenia, unspecified: Secondary | ICD-10-CM | POA: Diagnosis not present

## 2023-04-07 DIAGNOSIS — M3500 Sicca syndrome, unspecified: Secondary | ICD-10-CM | POA: Diagnosis present

## 2023-04-07 DIAGNOSIS — Z953 Presence of xenogenic heart valve: Secondary | ICD-10-CM

## 2023-04-07 DIAGNOSIS — J9601 Acute respiratory failure with hypoxia: Secondary | ICD-10-CM | POA: Diagnosis not present

## 2023-04-07 DIAGNOSIS — I2721 Secondary pulmonary arterial hypertension: Secondary | ICD-10-CM | POA: Diagnosis not present

## 2023-04-07 DIAGNOSIS — Z9884 Bariatric surgery status: Secondary | ICD-10-CM | POA: Diagnosis not present

## 2023-04-07 DIAGNOSIS — Z8261 Family history of arthritis: Secondary | ICD-10-CM

## 2023-04-07 DIAGNOSIS — K7689 Other specified diseases of liver: Secondary | ICD-10-CM | POA: Diagnosis not present

## 2023-04-07 DIAGNOSIS — I2510 Atherosclerotic heart disease of native coronary artery without angina pectoris: Secondary | ICD-10-CM | POA: Diagnosis not present

## 2023-04-07 DIAGNOSIS — E66811 Obesity, class 1: Secondary | ICD-10-CM

## 2023-04-07 DIAGNOSIS — E782 Mixed hyperlipidemia: Secondary | ICD-10-CM

## 2023-04-07 DIAGNOSIS — J9611 Chronic respiratory failure with hypoxia: Secondary | ICD-10-CM | POA: Diagnosis not present

## 2023-04-07 DIAGNOSIS — J9610 Chronic respiratory failure, unspecified whether with hypoxia or hypercapnia: Secondary | ICD-10-CM | POA: Diagnosis not present

## 2023-04-07 DIAGNOSIS — K7460 Unspecified cirrhosis of liver: Secondary | ICD-10-CM | POA: Diagnosis not present

## 2023-04-07 DIAGNOSIS — R9389 Abnormal findings on diagnostic imaging of other specified body structures: Secondary | ICD-10-CM | POA: Diagnosis not present

## 2023-04-07 DIAGNOSIS — R918 Other nonspecific abnormal finding of lung field: Secondary | ICD-10-CM | POA: Diagnosis not present

## 2023-04-07 DIAGNOSIS — I272 Pulmonary hypertension, unspecified: Secondary | ICD-10-CM | POA: Diagnosis not present

## 2023-04-07 DIAGNOSIS — R609 Edema, unspecified: Secondary | ICD-10-CM | POA: Diagnosis not present

## 2023-04-07 DIAGNOSIS — J441 Chronic obstructive pulmonary disease with (acute) exacerbation: Secondary | ICD-10-CM | POA: Diagnosis not present

## 2023-04-07 DIAGNOSIS — R5381 Other malaise: Secondary | ICD-10-CM | POA: Diagnosis not present

## 2023-04-07 LAB — CBC WITH DIFFERENTIAL/PLATELET
Abs Immature Granulocytes: 0.01 10*3/uL (ref 0.00–0.07)
Basophils Absolute: 0 10*3/uL (ref 0.0–0.1)
Basophils Relative: 1 %
Eosinophils Absolute: 0.2 10*3/uL (ref 0.0–0.5)
Eosinophils Relative: 6 %
HCT: 42.3 % (ref 36.0–46.0)
Hemoglobin: 13 g/dL (ref 12.0–15.0)
Immature Granulocytes: 0 %
Lymphocytes Relative: 47 %
Lymphs Abs: 1.2 10*3/uL (ref 0.7–4.0)
MCH: 30.7 pg (ref 26.0–34.0)
MCHC: 30.7 g/dL (ref 30.0–36.0)
MCV: 100 fL (ref 80.0–100.0)
Monocytes Absolute: 0.4 10*3/uL (ref 0.1–1.0)
Monocytes Relative: 15 %
Neutro Abs: 0.8 10*3/uL — ABNORMAL LOW (ref 1.7–7.7)
Neutrophils Relative %: 31 %
Platelets: 48 10*3/uL — ABNORMAL LOW (ref 150–400)
RBC: 4.23 MIL/uL (ref 3.87–5.11)
RDW: 15.1 % (ref 11.5–15.5)
Smear Review: DECREASED
WBC: 2.5 10*3/uL — ABNORMAL LOW (ref 4.0–10.5)
nRBC: 0 % (ref 0.0–0.2)

## 2023-04-07 LAB — COMPREHENSIVE METABOLIC PANEL
ALT: 11 U/L (ref 0–44)
AST: 34 U/L (ref 15–41)
Albumin: 2.3 g/dL — ABNORMAL LOW (ref 3.5–5.0)
Alkaline Phosphatase: 49 U/L (ref 38–126)
Anion gap: 9 (ref 5–15)
BUN: 14 mg/dL (ref 8–23)
CO2: 34 mmol/L — ABNORMAL HIGH (ref 22–32)
Calcium: 8.4 mg/dL — ABNORMAL LOW (ref 8.9–10.3)
Chloride: 100 mmol/L (ref 98–111)
Creatinine, Ser: 0.78 mg/dL (ref 0.44–1.00)
GFR, Estimated: >60 mL/min (ref >60)
Glucose, Bld: 143 mg/dL — ABNORMAL HIGH (ref 70–99)
Potassium: 4.4 mmol/L (ref 3.5–5.1)
Sodium: 143 mmol/L (ref 135–145)
Total Bilirubin: 1 mg/dL (ref <1.2)
Total Protein: 5.5 g/dL — ABNORMAL LOW (ref 6.5–8.1)

## 2023-04-07 LAB — TROPONIN I (HIGH SENSITIVITY)
Troponin I (High Sensitivity): 13 ng/L (ref <18)
Troponin I (High Sensitivity): 15 ng/L (ref <18)

## 2023-04-07 LAB — URINALYSIS, ROUTINE W REFLEX MICROSCOPIC
Bilirubin Urine: NEGATIVE
Glucose, UA: NEGATIVE mg/dL
Ketones, ur: NEGATIVE mg/dL
Leukocytes,Ua: NEGATIVE
Nitrite: NEGATIVE
Protein, ur: NEGATIVE mg/dL
Specific Gravity, Urine: 1.009 (ref 1.005–1.030)
pH: 6 (ref 5.0–8.0)

## 2023-04-07 LAB — PROTIME-INR
INR: 1.9 — ABNORMAL HIGH (ref 0.8–1.2)
Prothrombin Time: 21.6 s — ABNORMAL HIGH (ref 11.4–15.2)

## 2023-04-07 LAB — BRAIN NATRIURETIC PEPTIDE: B Natriuretic Peptide: 515.8 pg/mL — ABNORMAL HIGH (ref 0.0–100.0)

## 2023-04-07 MED ORDER — UMECLIDINIUM BROMIDE 62.5 MCG/ACT IN AEPB
1.0000 | INHALATION_SPRAY | Freq: Every day | RESPIRATORY_TRACT | Status: DC
  Administered 2023-04-11 – 2023-04-14 (×4): 1 via RESPIRATORY_TRACT
  Filled 2023-04-07 (×2): qty 7

## 2023-04-07 MED ORDER — FUROSEMIDE 10 MG/ML IJ SOLN: 40.0000 mg | Freq: Every day | INTRAMUSCULAR | Status: DC

## 2023-04-07 MED ORDER — FUROSEMIDE 10 MG/ML IJ SOLN
40.0000 mg | Freq: Once | INTRAMUSCULAR | Status: AC
  Administered 2023-04-07: 40 mg via INTRAVENOUS
  Filled 2023-04-07: qty 4

## 2023-04-07 MED ORDER — GABAPENTIN 300 MG PO CAPS
300.0000 mg | ORAL_CAPSULE | Freq: Every day | ORAL | Status: DC
  Administered 2023-04-07 – 2023-04-13 (×7): 300 mg via ORAL
  Filled 2023-04-07 (×7): qty 1

## 2023-04-07 MED ORDER — NADOLOL 40 MG PO TABS
40.0000 mg | ORAL_TABLET | Freq: Every day | ORAL | Status: DC
  Administered 2023-04-07 – 2023-04-13 (×7): 40 mg via ORAL
  Filled 2023-04-07 (×8): qty 1

## 2023-04-07 MED ORDER — APIXABAN 5 MG PO TABS: 5.0000 mg | ORAL_TABLET | Freq: Two times a day (BID) | ORAL | Status: DC

## 2023-04-07 MED ORDER — ONDANSETRON HCL 4 MG/2ML IJ SOLN: 4.0000 mg | Freq: Four times a day (QID) | INTRAMUSCULAR | Status: DC | PRN

## 2023-04-07 MED ORDER — GABAPENTIN 100 MG PO CAPS
100.0000 mg | ORAL_CAPSULE | Freq: Two times a day (BID) | ORAL | Status: DC
  Administered 2023-04-08 – 2023-04-14 (×13): 100 mg via ORAL
  Filled 2023-04-07 (×13): qty 1

## 2023-04-07 MED ORDER — EZETIMIBE 10 MG PO TABS
10.0000 mg | ORAL_TABLET | Freq: Every day | ORAL | Status: DC
  Administered 2023-04-07 – 2023-04-13 (×7): 10 mg via ORAL
  Filled 2023-04-07 (×7): qty 1

## 2023-04-07 MED ORDER — ARFORMOTEROL TARTRATE 15 MCG/2ML IN NEBU
15.0000 ug | INHALATION_SOLUTION | Freq: Two times a day (BID) | RESPIRATORY_TRACT | Status: DC
  Administered 2023-04-07 – 2023-04-14 (×13): 15 ug via RESPIRATORY_TRACT
  Filled 2023-04-07 (×14): qty 2

## 2023-04-07 MED ORDER — ONDANSETRON HCL 4 MG PO TABS: 4.0000 mg | ORAL_TABLET | Freq: Four times a day (QID) | ORAL | Status: DC | PRN

## 2023-04-07 MED ORDER — ALBUTEROL SULFATE (2.5 MG/3ML) 0.083% IN NEBU
2.5000 mg | INHALATION_SOLUTION | Freq: Four times a day (QID) | RESPIRATORY_TRACT | Status: DC | PRN
  Filled 2023-04-07: qty 3

## 2023-04-07 NOTE — ED Provider Notes (Signed)
[place] EMERGENCY DEPARTMENT AT [facility] Provider Note   CSN: [id number] Arrival date & time: 04/07/23  1433     History  Chief Complaint  Patient presents with   Bil Leg Swelling    Whitney [name] is a 67 y.o. female history of OSA, Sjogren's, status post AVR, diabetes type 2, cirrhosis, A-fib on Eliquis, COPD, chronic pulmonary hypertension presented for bilateral leg swelling for the past 1-1/2 weeks.  Patient denies any chest pain or shortness of breath but notes that since having a respiratory infection at the beginning of November she has been on 3 L nasal cannula at home.  Patient states that she can still walk however notes that skin progressively harder to bend her legs and do everyday activities due to the leg swelling.  Patient denies any paresthesias or wounds or skin color changes.  Patient denies any abdominal pain, nauseous vomiting, fevers, dysuria.  Patient is not currently on any diuretics but son believes that she may need some due to the leg swelling.  Patient states that she is also concerned because the tips of her fingers get cold at random times especially when she is using the bathroom and feels that she has frostbite.  Patient thinks this could be related to her other autoimmune diseases.  Home Medications Prior to Admission medications   Medication Sig Start Date End Date Taking? Authorizing Provider  albuterol (PROVENTIL) (2.5 MG/3ML) 0.083% nebulizer solution Take 3 mLs (2.5 mg total) by nebulization every 6 (six) hours as needed for wheezing or shortness of breath. 03/12/23  Yes Cobb, Ruby Cola, NP  albuterol (VENTOLIN HFA) 108 (90 Base) MCG/ACT inhaler INHALE 2 PUFFS INTO THE LUNGS EVERY 4 HOURS AS NEEDED FOR WHEEZING OR SHORTNESS OF BREATH. 04/17/22  Yes Olalere, Adewale A, MD  Cyanocobalamin (B-12 PO) Take 1 tablet by mouth in the morning.   Yes [provider]  ELIQUIS 5 MG TABS tablet TAKE 1 TABLET BY MOUTH TWICE A DAY 02/15/23  Yes  Fenton, Clint R, PA  ezetimibe (ZETIA) 10 MG tablet Take 10 mg by mouth at bedtime.  09/17/16  Yes [provider]  gabapentin (NEURONTIN) 100 MG capsule Take 1 capsule (100 mg total) by mouth 2 (two) times daily at 10 am and 4 pm AND 3 capsules (300 mg total) at bedtime. 01/31/23  Yes McCue, Shanda Bumps, NP  MAGNESIUM GLUCONATE PO Take 1 tablet by mouth daily.   Yes [provider]  nadolol (CORGARD) 40 MG tablet TAKE 1 TABLET BY MOUTH EVERY DAY Patient taking differently: Take 40 mg by mouth at bedtime. 11/26/22  Yes Corky Crafts, MD  OZEMPIC, 0.25 OR 0.5 MG/DOSE, 2 MG/3ML SOPN Inject 2 mg into the skin once a week. 09/21/21  Yes [provider]  Tiotropium Bromide-Olodaterol (STIOLTO RESPIMAT) 2.5-2.5 MCG/ACT AERS INHALE 2 PUFFS INTO THE LUNGS DAILY. INHALE 2 PUFFS BY MOUTH INTO THE LUNGS DAILY 03/07/23  Yes Leslye Peer, MD  zinc gluconate 50 MG tablet Take 50 mg by mouth daily.   Yes [provider]      Allergies    Prednisone, Doxycycline, and Naproxen    Review of Systems   Review of Systems  Physical Exam Updated Vital Signs BP (!) 136/91   Pulse 62   Temp 98.2 F (36.8 C)   Resp 11   Ht 5\' 4"  (1.626 m)   Wt 97.5 kg   SpO2 97%   BMI 36.90 kg/m  Physical Exam Vitals reviewed.  Constitutional:      General: She is not in acute distress. HENT:     Head: Normocephalic and atraumatic.  Eyes:     Extraocular Movements: Extraocular movements intact.     Conjunctiva/sclera: Conjunctivae normal.     Pupils: Pupils are equal, round, and reactive to light.  Cardiovascular:     Rate and Rhythm: Normal rate and regular rhythm.     Pulses: Normal pulses.     Heart sounds: Normal heart sounds.     Comments: 2+ bilateral radial/dorsalis pedis pulses with regular rate Pulmonary:     Effort: Pulmonary effort is normal. No respiratory distress.     Breath sounds: Normal breath sounds.     Comments: On 3 L nasal cannula which is her  baseline Abdominal:     Palpations: Abdomen is soft.     Tenderness: There is no abdominal tenderness. There is no guarding or rebound.  Musculoskeletal:        General: Normal range of motion.     Cervical back: Normal range of motion and neck supple.     Comments: 5 out of 5 bilateral grip/leg extension strength Bilateral 2+ pitting edema Able to range bilateral ankles however limited due to swelling No bony tenderness abnormalities  Skin:    General: Skin is warm and dry.     Capillary Refill: Capillary refill takes less than 2 seconds.     Comments: No wounds noted or overlying skin color changes  Neurological:     General: No focal deficit present.     Mental Status: She is alert and oriented to person, place, and time.     Comments: Sensation intact in all 4 limbs  Psychiatric:        Mood and Affect: Mood normal.     ED Results / Procedures / Treatments   Labs (all labs ordered are listed, but only abnormal results are displayed) Labs Reviewed  COMPREHENSIVE METABOLIC PANEL - Abnormal; Notable for the following components:      Result Value   CO2 34 (*)    Glucose, Bld 143 (*)    Calcium 8.4 (*)    Total Protein 5.5 (*)    Albumin 2.3 (*)    All other components within normal limits  BRAIN NATRIURETIC PEPTIDE - Abnormal; Notable for the following components:   B Natriuretic Peptide 515.8 (*)    All other components within normal limits  CBC WITH DIFFERENTIAL/PLATELET - Abnormal; Notable for the following components:   WBC 2.5 (*)    Platelets 48 (*)    Neutro Abs 0.8 (*)    All other components within normal limits  PROTIME-INR - Abnormal; Notable for the following components:   Prothrombin Time 21.6 (*)    INR 1.9 (*)    All other components within normal limits  URINALYSIS, ROUTINE W REFLEX MICROSCOPIC  PATHOLOGIST SMEAR REVIEW  TROPONIN I (HIGH SENSITIVITY)  TROPONIN I (HIGH SENSITIVITY)    EKG EKG Interpretation Date/Time:  Sunday April 07 2023  16:35:49 EST Ventricular Rate:  77 PR Interval:  165 QRS Duration:  92 QT Interval:  311 QTC Calculation: 287 R Axis:   116  Text Interpretation: Sinus rhythm Supraventricular bigeminy Anteroseptal infarct, age indeterminate Abnormal T, consider ischemia, inferior leads Confirmed by Vivi Barrack [phone]) on 04/07/2023 5:37:34 PM  Radiology DG Chest Port 1 View  Result Date: 04/07/2023 CLINICAL DATA:  Fluid overload EXAM: PORTABLE CHEST 1 VIEW COMPARISON:  Chest radiograph dated 03/13/2023 FINDINGS: Lines/tubes: Implanted loop  recorder projects over the medial left mid chest. Lungs: Asymmetric elevation of the right hemidiaphragm, unchanged. Low lung volumes with bronchovascular crowding. Mild bilateral interstitial opacities. Pleura: No pneumothorax or pleural effusion. Heart/mediastinum: Similar enlarged, postsurgical cardiomediastinal silhouette. Bones: Median sternotomy wires are nondisplaced. IMPRESSION: 1. Mild bilateral interstitial opacities, which may represent pulmonary edema. 2. Similar cardiomegaly. Electronically Signed   By: Agustin Cree M.D.   On: 04/07/2023 17:23    Procedures Procedures    Medications Ordered in ED Medications  furosemide (LASIX) injection 40 mg (40 mg Intravenous Given 04/07/23 1859)    ED Course/ Medical Decision Making/ A&P                                 Medical Decision Making Amount and/or Complexity of Data Reviewed Labs: ordered. Radiology: ordered.  Risk Prescription drug management. Decision regarding hospitalization.   Whitney [name] 67 y.o. presented today for bilateral leg edema. Working DDx that I considered at this time includes, but not limited to, dependent edema, venous insufficiency, thrombophlebitis, secondary to medications, CHF, edema, AKI, nephrotic syndrome, pulmonary edema.  R/o DDx: dependent edema, venous insufficiency, thrombophlebitis, secondary to medications, AKI, nephrotic syndrome: These are considered less likely  due to history of present illness, physical exam, labs/imaging findings.  Review of prior external notes: 03/16/2023 discharge summary  Unique Tests and My Interpretation:  CBC: Slightly decreased WBC count from previous 2.5, slightly more thrombocytopenic from last time 48 CMP: Unremarkable PT/INR: Unremarkable, similar to previous Troponin: 13, 15 Pathology smear review: Pending UA: Pending BNP: 515.8, slightly down from previous EKG: Sinus 77 bpm, 0 ventricular bigeminy, no ST elevations or depressions noted that would be indicative of ischemia Chest x-ray: Mild bilateral pulmonary edema  Social Determinants of Health: none  Discussion with Independent Historian:  Son  Discussion of Management of Tests:  Brimage, DO Hospitalist  Risk: High: hospitalization or escalation of hospital-level care  Risk Stratification Score: none  Staffed with Jearld Fenton, MD  Plan: On exam patient was in no acute distress with stable vitals.  On exam patient does have 2+ bilateral pitting edema however was neuro vas intact was not showing any signs of wounds or infection.  Patient did present with multiple concerns today however the biggest concern is the leg swelling.  Will obtain labs including BNP to assess bilateral leg swelling as patient may need diuresis.  Patient's chest x-ray does show mild bilateral pulmonary edema along with a slightly elevated BNP.  Patient does appear fluid overloaded and given her past medical history will need admission for careful diuresis.  Hospitalist was consulted and accepted patient for admission.  Patient stable for admission at this time.  This chart was dictated using voice recognition software.  Despite best efforts to proofread,  errors can occur which can change the documentation meaning.         Final Clinical Impression(s) / ED Diagnoses Final diagnoses:  Acute on chronic congestive heart failure, unspecified heart failure type (HCC)  Acute  pulmonary edema [facility])    Rx / DC Orders ED Discharge Orders     None         Remi Deter 04/07/23 1953    Loetta Rough, MD 04/07/23 2233

## 2023-04-07 NOTE — ED Notes (Signed)
Pt ambulated to bathroom without difficulty.

## 2023-04-07 NOTE — ED Notes (Signed)
CCMD called and pt currently being monitored by CCMD tech

## 2023-04-07 NOTE — H&P (Signed)
History and Physical    Patient: Whitney [name] UJW:[id number] DOB: 05-01-1956 DOA: 04/07/2023 DOS: the patient was seen and examined on 04/07/2023 PCP: Daisy Floro, MD  Patient coming from: Home  Chief Complaint:  Chief Complaint  Patient presents with   Bil Leg Swelling   HPI: Whitney [name] is a 67 y.o. female with medical history significant of NASH cirrhosis, severe aortic stenosis status post AVR, type 2 diabetes diet-controlled, PAF (Eliquis), Sjogren syndrome, sleep apnea but no longer on CPAP as she uses 3 to 4 L nasal cannula due to recently diagnosed with COPD but is a non-smoker who presented for shortness of breath with worsening peripheral edema.  She notes that the last week or so her abdomen has been very distended and her legs were very swollen.  She slept in the recliner last night and her swelling went down. She has been having to wear dresses instead of her normal clothes due to the swelling.  Denies any fever, cough, chest pain, abdominal pain, nausea, vomiting or diarrhea.  Does have rhinorrhea since starting the nasal cannula.   ED course: Initial vitals were unremarkable.  Satting well on room 3 L nasal cannula but did desat at 1 point to 84% on room air.  During her time in the ED she has been intermittently tachypneic.  CMP showing glucose 143, CO2 34, calcium 8.4, total protein 5.5, albumin 2.3 with normal serum creatinine, AST, ALT and ALP.  BNP elevated at 516.  Troponins were flat at 13 and 15.  Portable chest x-ray showing cardiomegaly and pulmonary edema. CBC showing leukopenia, WBC 2.5, thrombocytopenia platelets 48 with normal hemoglobin.  INR is 1.9.  Urinalysis is unremarkable.  ED provider consulted the hospitalist service for evaluation for admission.    Review of Systems: As mentioned in the history of present illness. All other systems reviewed and are negative. Past Medical History:  Diagnosis Date   Allergic rhinitis    Anemia    hx   Arthritis     Asthma    hx yrs ago   Cirrhosis (HCC) last albumin 3.3 done at [facility] 06-16-2014 (under care everywhere tab in epic)   Secondary to Fatty liver --  followed by hepatology at [facility] (dr Ardine Eng)    Depression    Diabetes mellitus type II    type 2 diet conrolled   Dyspnea    Fibromyalgia    GERD (gastroesophageal reflux disease)    Heart murmur    asymptomatic ---  1989 from rhuematic fever   History of exercise stress test    05-05-2013---  negative bruce ETT given exercise workload,  no ischemia   History of hiatal hernia    History of kidney stones    History of rheumatic fever    1989   Hyperlipidemia    Leukocytopenia    Moderate aortic stenosis    AVA area 1.1cm2---  cardiologist --  dr Lannie Fields, 2014 in epic   NASH (nonalcoholic steatohepatitis)    OSA (obstructive sleep apnea)    was using CPAP before gastric sleeve 2015--  no uses after wt loss   Pneumonia    hx   Sjogren's syndrome (HCC)    Thrombocytopenia (HCC)    Past Surgical History:  Procedure Laterality Date   AORTIC VALVE REPLACEMENT N/A 12/05/2017   Procedure: AORTIC VALVE REPLACEMENT (AVR) TISSUE VALVE INSPIRIS;  Surgeon: Alleen Borne, MD;  Location: [facility] OR;  Service: Open Heart Surgery;  Laterality:  N/A;   COLONOSCOPY WITH ESOPHAGOGASTRODUODENOSCOPY (EGD)     CYSTOSCOPY WITH RETROGRADE PYELOGRAM, URETEROSCOPY AND STENT PLACEMENT Left 01/21/2015   Procedure: CYSTOSCOPY WITH LEFT  RETROGRADE PYELOGRAM, LEFT URETEROSCOPY AND STENT PLACEMENT;  Surgeon: Jerilee Field, MD;  Location[facility];  Service: Urology;  Laterality: Left;   CYSTOSCOPY WITH RETROGRADE PYELOGRAM, URETEROSCOPY AND STENT PLACEMENT Bilateral 02/24/2015   Procedure: CYSTOSCOPY WITH RIGHT RETROGRADE PYELOGRAM, BLADDER BIOPSY FULGERATION LEFT URETEROSCOPY AND STENT REPLACEMENT;  Surgeon: Jerilee Field, MD;  Location: [facility];  Service: Urology;  Laterality: Bilateral;   EXPLORATORY LAPAROSCOPY W/   CONE BIOPSY'S LEFT AND RIGHT LOBE OF LIVER  11-04-2007   HOLMIUM LASER APPLICATION Left 02/24/2015   Procedure: HOLMIUM LASER LITHOTRIPSY;  Surgeon: Jerilee Field, MD;  Location: [facility];  Service: Urology;  Laterality: Left;   HYSTEROSCOPY WITH D & C N/A 12/11/2012   Procedure: DILATATION AND CURETTAGE /HYSTEROSCOPY;  Surgeon: Dorien Chihuahua. Richardson Dopp, MD;  Location: [facility];  Service: Gynecology;  Laterality: N/A;   INGUINAL HERNIA REPAIR Left 10/22/2016   Procedure: LEFT INGUINAL HERNIA REPAIR;  Surgeon: Emelia Loron, MD;  Location: [facility] OR;  Service: General;  Laterality: Left;  TAP BLOCK   INSERTION OF MESH Left 10/22/2016   Procedure: INSERTION OF MESH;  Surgeon: Emelia Loron, MD;  Location[facility] OR;  Service: General;  Laterality: Left;   LAPAROSCOPIC GASTRIC SLEEVE RESECTION  07-27-2013   LOOP RECORDER INSERTION N/A 04/16/2022   Procedure: LOOP RECORDER INSERTION;  Surgeon: [facility] Salvia, MD;  Location: [facility] INVASIVE CV LAB;  Service: Cardiovascular;  Laterality: N/A;   PUBOVAGINAL SLING  04-10-2001   SPARC   RIGHT/LEFT HEART CATH AND CORONARY ANGIOGRAPHY N/A 08/23/2017   Procedure: RIGHT/LEFT HEART CATH AND CORONARY ANGIOGRAPHY;  Surgeon: Corky Crafts, MD;  Location: [facility] INVASIVE CV LAB;  Service: Cardiovascular;  Laterality: N/A;   TEE WITHOUT CARDIOVERSION N/A 12/05/2017   Procedure: TRANSESOPHAGEAL ECHOCARDIOGRAM (TEE);  Surgeon: Alleen Borne, MD;  Location[facility] OR;  Service: Open Heart Surgery;  Laterality: N/A;   TONSILLECTOMY  1975   TRANSTHORACIC ECHOCARDIOGRAM  06-04-2012  dr Eldridge Dace   mild LVH,  grade I diastolic dysfunction/  ef 60-65%/  moderate LAE/  mild MV calcifation without stenosis,  mild MR/  moderate AV stenosis,  cannot r/o bicupsid, area 1.1cm2/  mild dilated aortic root/  trivial TR   Social History:  reports that she has never smoked. She has never used smokeless tobacco. She reports current alcohol use. She reports that she does not use  drugs.  Allergies  Allergen Reactions   Prednisone     Other Reaction(s): delerium   Doxycycline Hives and Rash   Naproxen Rash and Hives    Family History  Problem Relation Age of Onset   Alzheimer's disease Father    Hip fracture Father    Asthma Father    Heart disease Father    Heart attack Father    Hypertension Father    Rheum arthritis Mother    Heart disease Mother    Allergies Daughter    Asthma Paternal Grandmother    Asthma Daughter    Stroke Neg Hx     Prior to Admission medications   Medication Sig Start Date End Date Taking? Authorizing Provider  albuterol (PROVENTIL) (2.5 MG/3ML) 0.083% nebulizer solution Take 3 mLs (2.5 mg total) by nebulization every 6 (six) hours as needed for wheezing or shortness of breath. 03/12/23  Yes Noemi Chapel, NP  albuterol (  VENTOLIN HFA) 108 (90 Base) MCG/ACT inhaler INHALE 2 PUFFS INTO THE LUNGS EVERY 4 HOURS AS NEEDED FOR WHEEZING OR SHORTNESS OF BREATH. 04/17/22  Yes Olalere, Adewale A, MD  Cyanocobalamin (B-12 PO) Take 1 tablet by mouth in the morning.   Yes [provider]  ELIQUIS 5 MG TABS tablet TAKE 1 TABLET BY MOUTH TWICE A DAY 02/15/23  Yes Fenton, Clint R, PA  ezetimibe (ZETIA) 10 MG tablet Take 10 mg by mouth at bedtime.  09/17/16  Yes [provider]  gabapentin (NEURONTIN) 100 MG capsule Take 1 capsule (100 mg total) by mouth 2 (two) times daily at 10 am and 4 pm AND 3 capsules (300 mg total) at bedtime. 01/31/23  Yes McCue, Shanda Bumps, NP  MAGNESIUM GLUCONATE PO Take 1 tablet by mouth daily.   Yes [provider]  nadolol (CORGARD) 40 MG tablet TAKE 1 TABLET BY MOUTH EVERY DAY Patient taking differently: Take 40 mg by mouth at bedtime. 11/26/22  Yes Corky Crafts, MD  OZEMPIC, 0.25 OR 0.5 MG/DOSE, 2 MG/3ML SOPN Inject 2 mg into the skin once a week. 09/21/21  Yes [provider]  Tiotropium Bromide-Olodaterol (STIOLTO RESPIMAT) 2.5-2.5 MCG/ACT AERS INHALE 2 PUFFS INTO THE LUNGS  DAILY. INHALE 2 PUFFS BY MOUTH INTO THE LUNGS DAILY 03/07/23  Yes Leslye Peer, MD  zinc gluconate 50 MG tablet Take 50 mg by mouth daily.   Yes [provider]    Physical Exam: Vitals:   04/07/23 1901 04/07/23 1901 04/07/23 2030 04/07/23 2332  BP:   131/82 131/82  Pulse: 62  63 71  Resp: 11  (!) 21   Temp:  98.2 F (36.8 C)    TempSrc:      SpO2: 97%  100%   Weight:      Height:       GEN:     alert, cooperative female and no distress    HENT:  mucus membranes dry, no nasal discharge  EYES:   pupils equal and reactive, no scleral icterus or injection NECK:  supple, good ROM RESP:  Rales to mid lung, no wheezing, no increased work of breathing on 4 L [place] CVS:   regular rate and rhythm, +murmur, distal pulses intact  ABD:  soft, +distended, non-tender; bowel sounds present EXT:   Non-tender, baseline ROM, atraumatic, 3+ edema to thigh NEURO:  speech normal, alert and oriented, normal coordination  Skin:   warm and dry Psych: Normal affect, appropriate speech and behavior    Data Reviewed:  Relevant notes from primary care and specialist visits, past discharge summaries as available in EHR, including Care Everywhere. Prior diagnostic testing as pertinent to current admission diagnoses Updated medications and problem lists for reconciliation ED course, including vitals, labs, imaging, treatment and response to treatment Triage notes, nursing and pharmacy notes and ED provider's notes Notable results as noted in HPI  EKG showing sinus rhythm with bigeminy, anterior infarct is not new compared to previous EKG; personally interpreted by me  Assessment and Plan: Principal Problem:   Anasarca Active Problems:   Cirrhosis (HCC)   Paroxysmal atrial fibrillation (HCC)   Sjogren's syndrome (HCC)   Hyperlipidemia   Severe aortic stenosis   S/P AVR   DM2 (diabetes mellitus, type 2) (HCC)   NASH (nonalcoholic steatohepatitis)   Thrombocytopenia (HCC)   Other  neutropenia (HCC)  Anasarca Etiology may be multifactorial cardiogenic versus secondary to [place] cirrhosis. Lasix 40 mg IM given in the ED -Lasix 40 mg  scheduled for 2 doses -Daily weights -Strict intake and output -Low-sodium diet -SCDs for VTE prophylaxis given thrombocytopenia  Chronic respiratory failure Follows with pulmonology and was recently diagnosed with COPD despite no history of smoking.  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  On chart review, she has history of paralysis of the diaphragm and a restrictive lung with right hemidiaphragm paralysis. She reports performing PFTs at some point.  Uses 3 to 4 L nasal cannula since hospitalization earlier this month. -Maintain oxygen saturations above 89% -Patient request to avoid steroids as they cause some altered mental status at previous hospitalization  Aortic valve stenosis status post supportive valve replacement.  Pulmonary hypertension Echo performed 03/14/2023 showing LV function 65 to 70%, moderate LV concentric hypertrophy, moderately reduced RV systolic function, severely elevated pulmonary arterial systolic pressure with dilated left and right atria. - Consider pulmonology consult if not improving    NASH cirrhosis Albumin and total protein are low.  Alpha-1 anti-tryptase was negative at last admission.  Non-insulin-dependent type 2 diabetes Nonfasting glucose on admission 143.  Prescribed Ozempic but she denies taking any diabetic medications.  She states she controls this with her diet. -Obtain hemoglobin A1C -Monitor glucose with morning labs  Paroxysmal A-fib Holding Eliquis in the setting of thrombocytopenia.  Continue home nadolol  Sjogren syndrome She has switching to new rheumatologist here at home.  There is concern for pulmonary arterial hypertension based on previous echo.  Hyperlipidemia Continue home Zetia   Advance Care Planning:   Code Status: Full Code   Consults:  None  Family Communication: Grand-daughter at bedside ([facility] peds resident)   Severity of Illness: The appropriate patient status for this patient is INPATIENT. Inpatient status is judged to be reasonable and necessary in order to provide the required intensity of service to ensure the patient's safety. The patient's presenting symptoms, physical exam findings, and initial radiographic and laboratory data in the context of their chronic comorbidities is felt to place them at high risk for further clinical deterioration. Furthermore, it is not anticipated that the patient will be medically stable for discharge from the hospital within 2 midnights of admission.   * I certify that at the point of admission it is my clinical judgment that the patient will require inpatient hospital care spanning beyond 2 midnights from the point of admission due to high intensity of service, high risk for further deterioration and high frequency of surveillance required.*  Author: Katha Cabal, DO 04/07/2023 11:59 PM  For on call review [email or web address].

## 2023-04-07 NOTE — ED Triage Notes (Addendum)
Pt BIB [facility] from home d/t bil lower extremity edema for the past week & a half & abd distention. Also reports pain in both legs as well stating it feels like her legs will "split open." Has been seen recently here & treated for upper respiratory infection & pt denies having seen her PCP for the edema she arrives with here today. A/Ox4 was 130/90, 72 bpm, 80% on RA & with 3L O2 via n/c she was 97%.

## 2023-04-08 ENCOUNTER — Inpatient Hospital Stay (HOSPITAL_COMMUNITY): Payer: Medicare PPO

## 2023-04-08 DIAGNOSIS — R600 Localized edema: Secondary | ICD-10-CM

## 2023-04-08 DIAGNOSIS — K7581 Nonalcoholic steatohepatitis (NASH): Secondary | ICD-10-CM | POA: Diagnosis not present

## 2023-04-08 DIAGNOSIS — J9611 Chronic respiratory failure with hypoxia: Secondary | ICD-10-CM

## 2023-04-08 DIAGNOSIS — Z952 Presence of prosthetic heart valve: Secondary | ICD-10-CM | POA: Diagnosis not present

## 2023-04-08 DIAGNOSIS — R601 Generalized edema: Secondary | ICD-10-CM | POA: Diagnosis not present

## 2023-04-08 DIAGNOSIS — K7460 Unspecified cirrhosis of liver: Secondary | ICD-10-CM

## 2023-04-08 LAB — COMPREHENSIVE METABOLIC PANEL
ALT: 13 U/L (ref 0–44)
AST: 32 U/L (ref 15–41)
Albumin: 2.2 g/dL — ABNORMAL LOW (ref 3.5–5.0)
Alkaline Phosphatase: 51 U/L (ref 38–126)
Anion gap: 9 (ref 5–15)
BUN: 14 mg/dL (ref 8–23)
CO2: 34 mmol/L — ABNORMAL HIGH (ref 22–32)
Calcium: 8.5 mg/dL — ABNORMAL LOW (ref 8.9–10.3)
Chloride: 100 mmol/L (ref 98–111)
Creatinine, Ser: 0.9 mg/dL (ref 0.44–1.00)
GFR, Estimated: >60 mL/min (ref >60)
Glucose, Bld: 106 mg/dL — ABNORMAL HIGH (ref 70–99)
Potassium: 4.3 mmol/L (ref 3.5–5.1)
Sodium: 143 mmol/L (ref 135–145)
Total Bilirubin: 0.8 mg/dL (ref <1.2)
Total Protein: 5.7 g/dL — ABNORMAL LOW (ref 6.5–8.1)

## 2023-04-08 LAB — GLUCOSE, CAPILLARY
Glucose-Capillary: 90 mg/dL (ref 70–99)
Glucose-Capillary: 80 mg/dL (ref 70–99)
Glucose-Capillary: 164 mg/dL — ABNORMAL HIGH (ref 70–99)

## 2023-04-08 LAB — CBC
HCT: 41.5 % (ref 36.0–46.0)
Hemoglobin: 12.5 g/dL (ref 12.0–15.0)
MCH: 30.7 pg (ref 26.0–34.0)
MCHC: 30.1 g/dL (ref 30.0–36.0)
MCV: 102 fL — ABNORMAL HIGH (ref 80.0–100.0)
Platelets: 44 10*3/uL — ABNORMAL LOW (ref 150–400)
RBC: 4.07 MIL/uL (ref 3.87–5.11)
RDW: 15.1 % (ref 11.5–15.5)
WBC: 2.3 10*3/uL — ABNORMAL LOW (ref 4.0–10.5)
nRBC: 0 % (ref 0.0–0.2)

## 2023-04-08 LAB — PATHOLOGIST SMEAR REVIEW

## 2023-04-08 LAB — HEMOGLOBIN A1C
Hgb A1c MFr Bld: 5.8 % — ABNORMAL HIGH (ref 4.8–5.6)
Mean Plasma Glucose: 119.76 mg/dL

## 2023-04-08 MED ORDER — INSULIN ASPART 100 UNIT/ML IJ SOLN: 0.0000 [IU] | Freq: Every day | INTRAMUSCULAR | Status: DC

## 2023-04-08 MED ORDER — APIXABAN 5 MG PO TABS
5.0000 mg | ORAL_TABLET | Freq: Two times a day (BID) | ORAL | Status: DC
  Administered 2023-04-08 – 2023-04-10 (×4): 5 mg via ORAL
  Filled 2023-04-08 (×5): qty 1

## 2023-04-08 MED ORDER — INSULIN ASPART 100 UNIT/ML IJ SOLN: 0.0000 [IU] | Freq: Three times a day (TID) | INTRAMUSCULAR | Status: DC

## 2023-04-08 MED ORDER — FUROSEMIDE 10 MG/ML IJ SOLN
40.0000 mg | Freq: Two times a day (BID) | INTRAMUSCULAR | Status: DC
Start: 2023-04-08 — End: 2023-04-13
  Administered 2023-04-08 – 2023-04-13 (×10): 40 mg via INTRAVENOUS
  Filled 2023-04-08 (×11): qty 4

## 2023-04-08 NOTE — Progress Notes (Signed)
PHARMACY - ANTICOAGULATION CONSULT NOTE  Pharmacy Consult for apixaban Indication: atrial fibrillation  Allergies  Allergen Reactions   Prednisone     Other Reaction(s): delerium   Doxycycline Hives and Rash   Naproxen Rash and Hives    Patient Measurements: Height: 5\' 4"  (162.6 cm) Weight: 97.5 kg (215 lb) IBW/kg (Calculated) : 54.7  Vital Signs: Temp: 98.4 F (36.9 C) (12/02 0833) Temp Source: Oral (12/02 0833) BP: 130/75 (12/02 0832) Pulse Rate: 70 (12/02 0832)  Labs: Recent Labs    04/07/23 1603 04/07/23 1859 04/08/23 0522  HGB 13.0  --  12.5  HCT 42.3  --  41.5  PLT 48*  --  44*  LABPROT 21.6*  --   --   INR 1.9*  --   --   CREATININE 0.78  --  0.90  TROPONINIHS 13 15  --     Estimated Creatinine Clearance: 66.9 mL/min (by C-G formula based on SCr of 0.9 mg/dL).   Medical History: Past Medical History:  Diagnosis Date   Allergic rhinitis    Anemia    hx   Arthritis    Asthma    hx yrs ago   Cirrhosis (HCC) last albumin 3.3 done at [facility] 06-16-2014 (under care everywhere tab in epic)   Secondary to Fatty liver --  followed by hepatology at [facility] (dr Ardine Eng)    Depression    Diabetes mellitus type II    type 2 diet conrolled   Dyspnea    Fibromyalgia    GERD (gastroesophageal reflux disease)    Heart murmur    asymptomatic ---  1989 from rhuematic fever   History of exercise stress test    05-05-2013---  negative bruce ETT given exercise workload,  no ischemia   History of hiatal hernia    History of kidney stones    History of rheumatic fever    1989   Hyperlipidemia    Leukocytopenia    Moderate aortic stenosis    AVA area 1.1cm2---  cardiologist --  dr Lannie Fields, 2014 in epic   NASH (nonalcoholic steatohepatitis)    OSA (obstructive sleep apnea)    was using CPAP before gastric sleeve 2015--  no uses after wt loss   Pneumonia    hx   Sjogren's syndrome (HCC)    Thrombocytopenia (HCC)     Medications:  Scheduled:   arformoterol   15 mcg Nebulization BID   And   umeclidinium bromide  1 puff Inhalation Daily   ezetimibe  10 mg Oral QHS   furosemide  40 mg Intravenous BID   gabapentin  100 mg Oral BID   And   gabapentin  300 mg Oral QHS   insulin aspart  0-15 Units Subcutaneous TID WC   insulin aspart  0-5 Units Subcutaneous QHS   nadolol  40 mg Oral QHS   Infusions:   Assessment: 67 yo F who presented with anasarca. PMH significant for NASH cirrhosis, pAF (on Eliquis PTA). Last dose of Eliquis prior to admission 12/1 @ 1230. Pharmacy consulted for apixaban dosing.  Notably, patients platelets are low stable in the 40s. Has chronic thrombocytopenia likely 2/2 cirrhosis per GI. Will plan to monitor platelets very closely and for signs/symptoms of bleeding.  Goal of Therapy:  Monitor platelets by anticoagulation protocol: Yes   Plan:  Start apixaban 5 mg twice daily Monitor platelets closely and for signs/symptoms of bleeding  Ernestene Kiel, PharmD PGY1 Pharmacy Resident  Please check AMION for all [facility]  Pharmacy phone numbers After 10:00 PM, call Main Pharmacy [phone] 04/08/2023,9:58 AM

## 2023-04-08 NOTE — Telephone Encounter (Signed)
I'm sorry I don't have any openings. Agree with getting her in w APP sooner if she will agree to do so. ANd also to adding her cancellation list

## 2023-04-08 NOTE — Progress Notes (Signed)
PROGRESS NOTE    Whitney [name]  ZOX:[id number] DOB: 02-23-1956 DOA: 04/07/2023 PCP: Daisy Floro, MD     Brief Narrative:   From admission h and p  Whitney [name] is a 67 y.o. female with medical history significant of NASH cirrhosis, severe aortic stenosis status post AVR, type 2 diabetes diet-controlled, PAF (Eliquis), Sjogren syndrome, sleep apnea but no longer on CPAP as she uses 3 to 4 L nasal cannula due to recently diagnosed with COPD but is a non-smoker who presented for shortness of breath with worsening peripheral edema.  She notes that the last week or so her abdomen has been very distended and her legs were very swollen.  She slept in the recliner last night and her swelling went down. She has been having to wear dresses instead of her normal clothes due to the swelling.  Denies any fever, cough, chest pain, abdominal pain, nausea, vomiting or diarrhea.  Does have rhinorrhea since starting the nasal cannula.      Assessment & Plan:   Principal Problem:   Anasarca Active Problems:   Cirrhosis (HCC)   Paroxysmal atrial fibrillation (HCC)   Sjogren's syndrome (HCC)   Hyperlipidemia   Severe aortic stenosis   S/P AVR   DM2 (diabetes mellitus, type 2) (HCC)   NASH (nonalcoholic steatohepatitis)   Thrombocytopenia (HCC)   Other neutropenia (HCC)   Anasarca Etiology may be multifactorial cardiogenic versus secondary to [place] cirrhosis. Lasix 40 mg IM given in the ED -continue lasix -Daily weights -Strict intake and output -Low-sodium diet - further w/u as below   Aortic valve stenosis status post supportive valve replacement.  Pulmonary hypertension Echo performed 03/14/2023 showing LV function 65 to 70%, moderate LV concentric hypertrophy, moderately reduced RV systolic function, severely elevated pulmonary arterial systolic pressure with dilated left and right atria and prosthesis patient mismatch with mildly elevated gradients - cardiology consult to help  further elucidate to what extent cardiac problems are causing fluid retention   NASH cirrhosis Albumin and total protein are low.  Followed by [facility] as outpatient. Doesn't appear has history of ascites but now with fluid overload, not on diuretic at home - continue lasix - liver dopplers to eval for pvt, abd u/s for ascites eval   Chronic respiratory failure Follows with pulmonology and was recently diagnosed with COPD despite no history of smoking.  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  On chart review, she has history of paralysis of the diaphragm and a restrictive lung with right hemidiaphragm paralysis. She reports performing PFTs at some point.  Uses 3 to 4 L nasal cannula since hospitalization earlier this month. -Maintain oxygen saturations above 89% -Patient request to avoid steroids as they cause some altered mental status at previous hospitalization   Non-insulin-dependent type 2 diabetes Nonfasting glucose on admission 143.  Prescribed Ozempic but she denies taking any diabetic medications.  She states she controls this with her diet. -Obtain hemoglobin A1C -SSI   Paroxysmal A-fib Cont home apixaban and nadolol  Thrombocytopenia, neutropenia Per gi note has been evaluated by heme and these thought to be 2/2 cirrhosis   Sjogren syndrome She has switching to new rheumatologist here at home.  There is concern for pulmonary arterial hypertension based on previous echo.   Hyperlipidemia Continue home Zetia   DVT prophylaxis: home apixaban Code Status: full Family Communication: none @ bedside  Level of care: Telemetry Cardiac Status is: Inpatient Remains inpatient appropriate because: severity of  illness    Consultants:  cardiology  Procedures: none  Antimicrobials:  none    Subjective: Says swelling improving, no abd pain cough or dyspnea  Objective: Vitals:   04/08/23 0400 04/08/23 0830 04/08/23 0832 04/08/23 0833  BP:  123/76 130/75 130/75   Pulse: 64 70 70   Resp: (!) 31 19 18    Temp: 98.2 F (36.8 C)  98.1 F (36.7 C) 98.4 F (36.9 C)  TempSrc: Oral  Oral Oral  SpO2: 100% 98% 99%   Weight:      Height:       No intake or output data in the 24 hours ending 04/08/23 0931 Filed Weights   04/07/23 1443  Weight: 97.5 kg    Examination:  General exam: Appears calm and comfortable  Respiratory system: Clear to auscultation save for faint rales at bases Cardiovascular system: S1 & S2 heard, irreg.   Gastrointestinal system: Abdomen is obese, soft and nontender. No organomegaly or masses felt.   Central nervous system: Alert and oriented. No focal neurological deficits. Extremities: Symmetric 5 x 5 power. Pitting edema to thighs Skin: No rashes, lesions or ulcers Psychiatry: Judgement and insight appear normal. Mood & affect appropriate.     Data Reviewed: I have personally reviewed following labs and imaging studies  CBC: Recent Labs  Lab 04/07/23 1603 04/08/23 0522  WBC 2.5* 2.3*  NEUTROABS 0.8*  --   HGB 13.0 12.5  HCT 42.3 41.5  MCV 100.0 102.0*  PLT 48* 44*   Basic Metabolic Panel: Recent Labs  Lab 04/07/23 1603 04/08/23 0522  NA 143 143  K 4.4 4.3  CL 100 100  CO2 34* 34*  GLUCOSE 143* 106*  BUN 14 14  CREATININE 0.78 0.90  CALCIUM 8.4* 8.5*   GFR: Estimated Creatinine Clearance: 66.9 mL/min (by C-G formula based on SCr of 0.9 mg/dL). Liver Function Tests: Recent Labs  Lab 04/07/23 1603 04/08/23 0522  AST 34 32  ALT 11 13  ALKPHOS 49 51  BILITOT 1.0 0.8  PROT 5.5* 5.7*  ALBUMIN 2.3* 2.2*   No results for input(s): "LIPASE", "AMYLASE" in the last 168 hours. No results for input(s): "AMMONIA" in the last 168 hours. Coagulation Profile: Recent Labs  Lab 04/07/23 1603  INR 1.9*   Cardiac Enzymes: No results for input(s): "CKTOTAL", "CKMB", "CKMBINDEX", "TROPONINI" in the last 168 hours. BNP (last 3 results) Recent Labs    03/13/23 1428  PROBNP 996.0*    HbA1C: Recent Labs    04/08/23 0522  HGBA1C 5.8*   CBG: No results for input(s): "GLUCAP" in the last 168 hours. Lipid Profile: No results for input(s): "CHOL", "HDL", "LDLCALC", "TRIG", "CHOLHDL", "LDLDIRECT" in the last 72 hours. Thyroid Function Tests: No results for input(s): "TSH", "T4TOTAL", "FREET4", "T3FREE", "THYROIDAB" in the last 72 hours. Anemia Panel: No results for input(s): "VITAMINB12", "FOLATE", "FERRITIN", "TIBC", "IRON", "RETICCTPCT" in the last 72 hours. Urine analysis:    Component Value Date/Time   COLORURINE YELLOW 04/07/2023 2000   APPEARANCEUR HAZY (A) 04/07/2023 2000   LABSPEC 1.009 04/07/2023 2000   PHURINE 6.0 04/07/2023 2000   GLUCOSEU NEGATIVE 04/07/2023 2000   HGBUR SMALL (A) 04/07/2023 2000   BILIRUBINUR NEGATIVE 04/07/2023 2000   KETONESUR NEGATIVE 04/07/2023 2000   PROTEINUR NEGATIVE 04/07/2023 2000   UROBILINOGEN 1.0 02/15/2015 0837   NITRITE NEGATIVE 04/07/2023 2000   LEUKOCYTESUR NEGATIVE 04/07/2023 2000   Sepsis Labs: @LABRCNTIP (procalcitonin:4,lacticidven:4)  )No results found for this or any previous visit (from the past 240 hour(s)).  Radiology Studies: DG Chest Port 1 View  Result Date: 04/07/2023 CLINICAL DATA:  Fluid overload EXAM: PORTABLE CHEST 1 VIEW COMPARISON:  Chest radiograph dated 03/13/2023 FINDINGS: Lines/tubes: Implanted loop recorder projects over the medial left mid chest. Lungs: Asymmetric elevation of the right hemidiaphragm, unchanged. Low lung volumes with bronchovascular crowding. Mild bilateral interstitial opacities. Pleura: No pneumothorax or pleural effusion. Heart/mediastinum: Similar enlarged, postsurgical cardiomediastinal silhouette. Bones: Median sternotomy wires are nondisplaced. IMPRESSION: 1. Mild bilateral interstitial opacities, which may represent pulmonary edema. 2. Similar cardiomegaly. Electronically Signed   By: Agustin Cree M.D.   On: 04/07/2023 17:23        Scheduled Meds:   arformoterol  15 mcg Nebulization BID   And   umeclidinium bromide  1 puff Inhalation Daily   ezetimibe  10 mg Oral QHS   furosemide  40 mg Intravenous Daily   gabapentin  100 mg Oral BID   And   gabapentin  300 mg Oral QHS   nadolol  40 mg Oral QHS   Continuous Infusions:   LOS: 1 day     Silvano Bilis, MD [facility]   If 7PM-7AM, please contact night-coverage [email or web address] Password [facility] 04/08/2023, 9:31 AM

## 2023-04-08 NOTE — Consult Note (Signed)
*    Cardiology Consultation   Patient ID: ALAIAH MCMAINS MRN: [id number]; DOB: Aug 21, 1955  Admit date: 04/07/2023 Date of Consult: 04/08/2023  PCP:  Daisy Floro, MD   [facility] Providers Cardiologist:  Charlton Haws, MD     Patient Profile:   Whitney [name] is a 67 y.o. female with a hx of [name], chronic thrombocytopenia, gastric sleeve in 2015, aortic stenosis status post tissue AVR 2019, diabetes, hyperlipidemia, Sjogren's, paroxysmal atrial fibrillation, OSA, CVA status post loop recorder, COPD, paralysis of right hemidiaphragm who is being seen 04/08/2023 for the evaluation of CHF at the request of Dr. Ashok Pall.  History of Present Illness:   Whitney [name] is a 67 year old female with past medical history noted above. She has previously been followed by Dr. Eldridge Dace.  Known history of aortic stenosis that progressed with symptoms of exertional fatigue and tiredness.  Echocardiogram 08/2017 with mean gradient of 41 mmHg and peak gradient of 80 mmHg with normal LV function.  She underwent tissue AVR 12/2017 and had brief postoperative atrial fibrillation.    She was admitted with stroke 04/2022 with MRI showing multiple areas of infarct concerning for cardioembolic etiology.  She was seen by EP, Dr. Graciela Husbands and a loop recorder was placed.   Shortly after was found to have atrial fibrillation and referred to the A-fib clinic.  Her aspirin and Plavix was stopped (this started in the setting of her CVA) placed on Eliquis 5 mg twice daily   Seen in the office on 06/2022 with notes indicating she was continuing to have intermittent shortness of breath as well as weight gain.  There was concern about portopulmonary hypertension.  Consideration given to CPX testing.    Seen 07/2022 with Dr. Graciela Husbands.  With her history of chronic thrombocytopenia in the context of NASH plans were to reach out to hematology for further recommendations regarding ongoing anticoagulation.  She was last seen 11/7 when  she presented with complaints of cough, dyspnea and orthopnea after being seen in her pulmonology office.  Cardiology was consulted during that admission for mildly elevated troponin.  Echocardiogram showed LVEF of 65 to 70%, moderate LVH, mildly reduced RV, severely elevated pulmonary artery pressure of 68 mmHg, severe left atrial enlargement, mildly dilated right atrium, mild MR, no significant prosthetic aortic valve stenosis, mildly elevated gradients on aortic valve were felt to be consistent with prosthesis mismatch.  It was felt that her dyspnea was multifactorial in the setting of a recent upper respiratory infection as well as mild volume overload.  She was briefly diuresed.  It was felt that her pulmonary hypertension was secondary to pulmonary venous hypertension, valvular disease and COPD/elevated hemidiaphragm.  If her dyspnea persists would consider CPX as an outpatient.  It was not felt that she would require a diuretic at discharge.  She presented back to the ED on 12/1 with complaints of bilateral lower extremity edema, abdominal distention for the past week and a half. Reports worsening lower extremity edema, has been unable to get in and out of her bed. Weakness, overall breathing was at her baseline.   Labs in the ED showed sodium 143, potassium 4.4, creatinine 0.78, albumin 2.3, BNP 515, high-sensitivity troponin 13>> 15, WBC 2.5, hemoglobin 13.  Chest x-ray with mild bilateral interstitial opacities, possibly pulmonary edema.  EKG showed bigeminy, 77 bpm. She was admitted to internal medicine for further management. Cardiology asked to evaluate.  Past Medical History:  Diagnosis Date   Allergic rhinitis  Anemia    hx   Arthritis    Asthma    hx yrs ago   Cirrhosis (HCC) last albumin 3.3 done at [facility] 06-16-2014 (under care everywhere tab in epic)   Secondary to Fatty liver --  followed by hepatology at [facility] (dr Ardine Eng)    Depression    Diabetes mellitus type II    type 2 diet  conrolled   Dyspnea    Fibromyalgia    GERD (gastroesophageal reflux disease)    Heart murmur    asymptomatic ---  1989 from rhuematic fever   History of exercise stress test    05-05-2013---  negative bruce ETT given exercise workload,  no ischemia   History of hiatal hernia    History of kidney stones    History of rheumatic fever    1989   Hyperlipidemia    Leukocytopenia    Moderate aortic stenosis    AVA area 1.1cm2---  cardiologist --  dr Lannie Fields, 2014 in epic   NASH (nonalcoholic steatohepatitis)    OSA (obstructive sleep apnea)    was using CPAP before gastric sleeve 2015--  no uses after wt loss   Pneumonia    hx   Sjogren's syndrome (HCC)    Thrombocytopenia (HCC)     Past Surgical History:  Procedure Laterality Date   AORTIC VALVE REPLACEMENT N/A 12/05/2017   Procedure: AORTIC VALVE REPLACEMENT (AVR) TISSUE VALVE INSPIRIS;  Surgeon: Alleen Borne, MD;  Location: [facility];  Service: Open Heart Surgery;  Laterality: N/A;   COLONOSCOPY WITH ESOPHAGOGASTRODUODENOSCOPY (EGD)     CYSTOSCOPY WITH RETROGRADE PYELOGRAM, URETEROSCOPY AND STENT PLACEMENT Left 01/21/2015   Procedure: CYSTOSCOPY WITH LEFT  RETROGRADE PYELOGRAM, LEFT URETEROSCOPY AND STENT PLACEMENT;  Surgeon: Jerilee Field, MD;  Location: [facility];  Service: Urology;  Laterality: Left;   CYSTOSCOPY WITH RETROGRADE PYELOGRAM, URETEROSCOPY AND STENT PLACEMENT Bilateral 02/24/2015   Procedure: CYSTOSCOPY WITH RIGHT RETROGRADE PYELOGRAM, BLADDER BIOPSY FULGERATION LEFT URETEROSCOPY AND STENT REPLACEMENT;  Surgeon: Jerilee Field, MD;  Location[facility];  Service: Urology;  Laterality: Bilateral;   EXPLORATORY LAPAROSCOPY W/  CONE BIOPSY'S LEFT AND RIGHT LOBE OF LIVER  11-04-2007   HOLMIUM LASER APPLICATION Left 02/24/2015   Procedure: HOLMIUM LASER LITHOTRIPSY;  Surgeon: Jerilee Field, MD;  Location: [facility];  Service: Urology;  Laterality: Left;    HYSTEROSCOPY WITH D & C N/A 12/11/2012   Procedure: DILATATION AND CURETTAGE /HYSTEROSCOPY;  Surgeon: Dorien Chihuahua. Richardson Dopp, MD;  Location: [facility];  Service: Gynecology;  Laterality: N/A;   INGUINAL HERNIA REPAIR Left 10/22/2016   Procedure: LEFT INGUINAL HERNIA REPAIR;  Surgeon: Emelia Loron, MD;  Location: [facility] OR;  Service: General;  Laterality: Left;  TAP BLOCK   INSERTION OF MESH Left 10/22/2016   Procedure: INSERTION OF MESH;  Surgeon: Emelia Loron, MD;  Location: [facility] OR;  Service: General;  Laterality: Left;   LAPAROSCOPIC GASTRIC SLEEVE RESECTION  07-27-2013   LOOP RECORDER INSERTION N/A 04/16/2022   Procedure: LOOP RECORDER INSERTION;  Surgeon: [facility] Salvia, MD;  Location: [facility] INVASIVE CV LAB;  Service: Cardiovascular;  Laterality: N/A;   PUBOVAGINAL SLING  04-10-2001   SPARC   RIGHT/LEFT HEART CATH AND CORONARY ANGIOGRAPHY N/A 08/23/2017   Procedure: RIGHT/LEFT HEART CATH AND CORONARY ANGIOGRAPHY;  Surgeon: Corky Crafts, MD;  Location: [facility] INVASIVE CV LAB;  Service: Cardiovascular;  Laterality: N/A;   TEE WITHOUT CARDIOVERSION N/A 12/05/2017   Procedure: TRANSESOPHAGEAL ECHOCARDIOGRAM (TEE);  Surgeon:  Alleen Borne, MD;  Location: [facility] OR;  Service: Open Heart Surgery;  Laterality: N/A;   TONSILLECTOMY  1975   TRANSTHORACIC ECHOCARDIOGRAM  06-04-2012  dr Eldridge Dace   mild LVH,  grade I diastolic dysfunction/  ef 60-65%/  moderate LAE/  mild MV calcifation without stenosis,  mild MR/  moderate AV stenosis,  cannot r/o bicupsid, area 1.1cm2/  mild dilated aortic root/  trivial TR       Inpatient Medications: Scheduled Meds:  apixaban  5 mg Oral BID   arformoterol  15 mcg Nebulization BID   And   umeclidinium bromide  1 puff Inhalation Daily   ezetimibe  10 mg Oral QHS   furosemide  40 mg Intravenous BID   gabapentin  100 mg Oral BID   And   gabapentin  300 mg Oral QHS   insulin aspart  0-15 Units Subcutaneous TID WC   insulin aspart  0-5 Units Subcutaneous QHS   nadolol  40  mg Oral QHS   Continuous Infusions:  PRN Meds: albuterol, ondansetron **OR** ondansetron (ZOFRAN) IV  Allergies:    Allergies  Allergen Reactions   Prednisone     Other Reaction(s): delerium   Doxycycline Hives and Rash   Naproxen Rash and Hives    Social History:   Social History   Socioeconomic History   Marital status: Widowed    Spouse name: Married to [employer]   Number of children: Not on file   Years of education: Not on file   Highest education level: Not on file  Occupational History   Occupation: principal of [employer]  Tobacco Use   Smoking status: Never   Smokeless tobacco: Never   Tobacco comments:    Never smoke 05/08/22  Vaping Use   Vaping status: Never Used  Substance and Sexual Activity   Alcohol use: Yes    Comment: rarely   Drug use: No   Sexual activity: Not on file  Other Topics Concern   Not on file  Social History Narrative   Not on file   Social Determinants of Health   Financial Resource Strain: Not on file  Food Insecurity: No Food Insecurity (04/08/2023)   Hunger Vital Sign    Worried About Running Out of Food in the Last Year: Never true    Ran Out of Food in the Last Year: Never true  Transportation Needs: No Transportation Needs (04/08/2023)   PRAPARE - Administrator, Civil Service (Medical): No    Lack of Transportation (Non-Medical): No  Physical Activity: Not on file  Stress: Not on file  Social Connections: Not on file  Intimate Partner Violence: Not At Risk (04/08/2023)   Humiliation, Afraid, Rape, and Kick questionnaire    Fear of Current or Ex-Partner: No    Emotionally Abused: No    Physically Abused: No    Sexually Abused: No    Family History:    Family History  Problem Relation Age of Onset   Alzheimer's disease Father    Hip fracture Father    Asthma Father    Heart disease Father    Heart attack Father    Hypertension Father    Rheum arthritis Mother    Heart disease Mother     Allergies Daughter    Asthma Paternal Grandmother    Asthma Daughter    Stroke Neg Hx      ROS:  Please see the history of present illness.   All other ROS reviewed  and negative.     Physical Exam/Data:   Vitals:   04/08/23 0832 04/08/23 0833 04/08/23 1000 04/08/23 1048  BP: 130/75  127/78 120/65  Pulse: 70     Resp: 18  18 20   Temp: 98.1 F (36.7 C) 98.4 F (36.9 C) 98.7 F (37.1 C) 98.5 F (36.9 C)  TempSrc: Oral Oral Oral Oral  SpO2: 99%  100%   Weight:   110.8 kg   Height:   5\' 4"  (1.626 m)     Intake/Output Summary (Last 24 hours) at 04/08/2023 1249 Last data filed at 04/08/2023 1051 Gross per 24 hour  Intake 240 ml  Output --  Net 240 ml      04/08/2023   10:00 AM 04/07/2023    2:43 PM 03/17/2023    5:35 PM  Last 3 Weights  Weight (lbs) 244 lb 4.3 oz 215 lb 220 lb  Weight (kg) 110.8 kg 97.523 kg 99.791 kg     Body mass index is 41.93 kg/m.  General:  Obese, older female sitting up in bed. [place]@3L  HEENT: normal Neck: no JVD Vascular: No carotid bruits; Distal pulses 2+ bilaterally Cardiac:  normal S1, S2; RRR; no murmur  Lungs: Slightly diminished in bases  Abd: soft, nontender, no hepatomegaly  Ext: 2+ pitting bilateral LE edema to shins Musculoskeletal:  No deformities, BUE and BLE strength normal and equal Skin: warm and dry  Neuro:  CNs 2-12 intact, no focal abnormalities noted Psych:  Normal affect   EKG:  The EKG was personally reviewed and demonstrates:  Bigeminy, 77 bpm Telemetry:  Telemetry was personally reviewed and demonstrates:  Sinus Rhythm  Relevant CV Studies:  Echo: 03/14/2023  IMPRESSIONS     1. Left ventricular ejection fraction, by estimation, is 65 to 70%. The  left ventricle has normal function. The left ventricle has no regional  wall motion abnormalities. There is moderate concentric left ventricular  hypertrophy. Left ventricular  diastolic function could not be evaluated.   2. Right ventricular systolic function is  mildly reduced. The right  ventricular size is normal. There is severely elevated pulmonary artery  systolic pressure. The estimated right ventricular systolic pressure is  68.0 mmHg.   3. Left atrial size was severely dilated.   4. Right atrial size was mildly dilated.   5. Exuberant mitral annular calcification. The mitral valve is  degenerative. Mild mitral valve regurgitation. Mild mitral stenosis. The  mean mitral valve gradient is 4.5 mmHg with average heart rate of 77 bpm.  Severe mitral annular calcification.   6. The tricuspid valve is abnormal. Tricuspid valve regurgitation is  moderate to severe.   7. Findings most consistent with prosthesis patient mismatch with mildly  elevated gradients that have increased slightly compared to prior exam. No  significant prosthetic aortic valve stenosis. The aortic valve has been  repaired/replaced. Aortic valve  regurgitation is trivial and appears central. There is a 21 mm Edwards  Inspiris Resilia valve present in the aortic position. Procedure Date:  12/05/17. Aortic valve area, by VTI measures 1.45 cm. Aortic valve mean  gradient measures 23.7 mmHg. Aortic valve   Vmax measures 3.19 m/s. Aortic valve acceleration time measures 98 msec.   8. The inferior vena cava is dilated in size with <50% respiratory  variability, suggesting right atrial pressure of 15 mmHg.   FINDINGS   Left Ventricle: Left ventricular ejection fraction, by estimation, is 65  to 70%. The left ventricle has normal function. The left ventricle has no  regional wall motion abnormalities. The left ventricular internal cavity  size was normal in size. There is   moderate concentric left ventricular hypertrophy. Left ventricular  diastolic function could not be evaluated due to mitral annular  calcification (moderate or greater). Left ventricular diastolic function  could not be evaluated.   Right Ventricle: The right ventricular size is normal. No increase in   right ventricular wall thickness. Right ventricular systolic function is  mildly reduced. There is severely elevated pulmonary artery systolic  pressure. The tricuspid regurgitant  velocity is 3.64 m/s, and with an assumed right atrial pressure of 15  mmHg, the estimated right ventricular systolic pressure is 68.0 mmHg.   Left [facility]: Left atrial size was severely dilated.   Right [facility]: Right atrial size was mildly dilated.   Pericardium: There is no evidence of pericardial effusion.   Mitral Valve: Exuberant mitral annular calcification. The mitral valve is  degenerative in appearance. Severe mitral annular calcification. Mild  mitral valve regurgitation. Mild mitral valve stenosis. MV peak gradient,  9.4 mmHg. The mean mitral valve  gradient is 4.5 mmHg with average heart rate of 77 bpm.   Tricuspid Valve: The tricuspid valve is abnormal. Tricuspid valve  regurgitation is moderate to severe.   Aortic Valve: Findings most consistent with prosthesis patient mismatch  with mildly elevated gradients that have increased slightly compared to  prior exam. No significant prosthetic aortic valve stenosis. The aortic  valve has been repaired/replaced.  Aortic valve regurgitation is trivial. Aortic valve mean gradient measures  23.7 mmHg. Aortic valve peak gradient measures 40.7 mmHg. Aortic valve  area, by VTI measures 1.45 cm. There is a 21 mm Edwards Inspiris Resilia  valve present in the aortic  position. Procedure Date: 12/05/17.   Pulmonic Valve: The pulmonic valve was normal in structure. Pulmonic valve  regurgitation is mild.   Aorta: The aortic root is normal in size and structure. Ascending aorta  measurements are within normal limits for age when indexed to body surface  area.   Venous: The inferior vena cava is dilated in size with less than 50%  respiratory variability, suggesting right atrial pressure of 15 mmHg.   IAS/Shunts: The interatrial septum was not well  visualized.   Laboratory Data:  High Sensitivity Troponin:   Recent Labs  Lab 03/14/23 1518 03/15/23 0039 03/15/23 0622 04/07/23 1603 04/07/23 1859  TROPONINIHS 75* 45* 37* 13 15     Chemistry Recent Labs  Lab 04/07/23 1603 04/08/23 0522  NA 143 143  K 4.4 4.3  CL 100 100  CO2 34* 34*  GLUCOSE 143* 106*  BUN 14 14  CREATININE 0.78 0.90  CALCIUM 8.4* 8.5*  GFRNONAA >60 >60  ANIONGAP 9 9    Recent Labs  Lab 04/07/23 1603 04/08/23 0522  PROT 5.5* 5.7*  ALBUMIN 2.3* 2.2*  AST 34 32  ALT 11 13  ALKPHOS 49 51  BILITOT 1.0 0.8   Lipids No results for input(s): "CHOL", "TRIG", "HDL", "LABVLDL", "LDLCALC", "CHOLHDL" in the last 168 hours.  Hematology Recent Labs  Lab 04/07/23 1603 04/08/23 0522  WBC 2.5* 2.3*  RBC 4.23 4.07  HGB 13.0 12.5  HCT 42.3 41.5  MCV 100.0 102.0*  MCH 30.7 30.7  MCHC 30.7 30.1  RDW 15.1 15.1  PLT 48* 44*   Thyroid No results for input(s): "TSH", "FREET4" in the last 168 hours.  BNP Recent Labs  Lab 04/07/23 1603  BNP 515.8*    DDimer No results for input(s): "DDIMER" in  the last 168 hours.   Radiology/Studies:  DG Chest Port 1 View  Result Date: 04/07/2023 CLINICAL DATA:  Fluid overload EXAM: PORTABLE CHEST 1 VIEW COMPARISON:  Chest radiograph dated 03/13/2023 FINDINGS: Lines/tubes: Implanted loop recorder projects over the medial left mid chest. Lungs: Asymmetric elevation of the right hemidiaphragm, unchanged. Low lung volumes with bronchovascular crowding. Mild bilateral interstitial opacities. Pleura: No pneumothorax or pleural effusion. Heart/mediastinum: Similar enlarged, postsurgical cardiomediastinal silhouette. Bones: Median sternotomy wires are nondisplaced. IMPRESSION: 1. Mild bilateral interstitial opacities, which may represent pulmonary edema. 2. Similar cardiomegaly. Electronically Signed   By: Agustin Cree M.D.   On: 04/07/2023 17:23     Assessment and Plan:   Whitney [name] is a 67 y.o. female with a hx of [name],  chronic thrombocytopenia, gastric sleeve in 2015, aortic stenosis status post tissue AVR 2019, diabetes, hyperlipidemia, Sjogren's, paroxysmal atrial fibrillation, OSA, CVA status post loop recorder, COPD, paralysis of right hemidiaphragm who is being seen 04/08/2023 for the evaluation of CHF at the request of Dr. Ashok Pall.  HFpEF LE edema -- presented with worsening bilateral LE edema, abd distention over the past 2 weeks. Has not been on standing diuretic prior to admission. Reports her weights have been up as well -- BNP 515, CXR with mild pulmonary edema -- biggest complaint related to significant LE swelling -- started on IV lasix 40mg  BID, reports UOP but not calculated in I&Os as she was incontinent. Says edema is better, breathing is mostly at her baseline  -- suspect she may need a daily diuretic at discharge   Chronic respiratory failure on supplemental O2 Pulmonary HTN Chronic elevation of right hemidiaphragm -- she denies any hx of COPD, but does follow with pulmonary with PFTs 12/2018 showing severe restrictive lung disease -- was placed on chronic O2 during one of her recent admissions -- echo 11/7 with severely elevated PASP felt to be secondary to pulmonary venous hypertension, COPD/OSA and valvular disease  -- may benefit from a RHC   Aortic stenosis s/p replacement -- mildly elevated gradient on recent echo felt to be 2/2 prosthesis mismatch  NASH cirrhosis -- follows through [facility] -- abd [place] pending  Paroxysmal Afib -- maintaining SR -- on Eliquis 5mg  BID PTA  Thrombocytopenia -- felt to be 2/2 cirrhosis  Sjogrens -- follows with rheumatology at [facility]  Risk Assessment/Risk Scores:     New York Heart Association (NYHA) Functional Class NYHA Class II  CHA2DS2-VASc Score = 6   This indicates a 9.7% annual risk of stroke. The patient's score is based upon: CHF History: 0 HTN History: 0 Diabetes History: 1 Stroke History: 2 Vascular Disease History: 1  (aortic atherosclerosis) Age Score: 1 Gender Score: 1    For questions or updates, please contact [facility] Please consult [email or web address] for contact info under    Signed, Laverda Page, NP  04/08/2023 12:49 PM

## 2023-04-08 NOTE — Telephone Encounter (Signed)
Thank you for letting me know

## 2023-04-08 NOTE — Consult Note (Signed)
[facility] Liaison Consult Note    04/08/2023  Whitney [name] 27-Nov-1955 [id number]  Insurance: [employer]ine Graven Medicare PPO   Primary Care Provider: Daisy Floro, MD, with [facility] physician at [facility], this provider is listed for the transition of care follow up appointments  and transitional calls   [facility] Liaison met patient at bedside at [facility]. Explained to patient regarding reason for rounding visit.  She endorses her PCP and receives follow up from the [place] team.    The patient was screened for 30 day readmission hospitalization with noted medium high risk score or unplanned readmission risk 2 hospital admissions in 6 months.  The patient was assessed for potential Community Care Coordination service needs for post hospital transition for care coordination. Review of patient's electronic medical record reveals patient denies any additional needs at time of this visit.  Plan: [facility] Liaison will continue to follow progress and disposition to asess for post hospital community care coordination/management needs.  Referral request for community care coordination:  None anticipated as [facility] transition team to follow.   [facility] Community Care, Population Health does not replace or interfere with any arrangements made by the Inpatient Transition of Care team.   For questions contact:   Charlesetta Shanks, RN, BSN, CCM [place]  [facility], [facility] Liaison Direct Dial: [phone] or secure chat Email: [email or web address]

## 2023-04-08 NOTE — TOC Initial Note (Signed)
Transition of Care [facility]) - Initial/Assessment Note    Patient Details  Name: Whitney [name] MRN: [id number] Date of Birth: Nov 27, 1955  Transition of Care [facility]) CM/SW Contact:    Leone Haven, RN Phone Number: 04/08/2023, 2:31 PM  Clinical Narrative:                 From home with son, has PCP and insurance on file, states she is active with [facility] for HHPT, HHOT and would like to continue with them.  She has home oxygen with adapt, neb machine and rollator.  States son  will transport her home at [employer] and he is support system, states [facility] [address].  Pta self ambulatory  with rollator.   Expected Discharge Plan: Home w Home Health Services Barriers to Discharge: Continued Medical Work up   Patient Goals and CMS Choice Patient states their goals for this hospitalization and ongoing recovery are:: return home with son CMS Medicare.gov Compare Post Acute Care list provided to:: Patient Choice offered to / list presented to : Patient      Expected Discharge Plan and Services In-house Referral: NA Discharge Planning Services: CM Consult Post Acute Care Choice: Home Health Living arrangements for the past 2 months: Single Family Home                 DME Arranged: N/A DME Agency: NA       HH Arranged: PT, OT HH Agency: [facility] Date [facility] Agency Contacted: 04/08/23 Time HH Agency Contacted: [phone] Representative spoke with at [facility] Agency: Kandee Keen  Prior Living Arrangements/Services Living arrangements for the past 2 months: Single Family Home Lives with:: Adult Children (son) Patient language and need for interpreter reviewed:: Yes Do you feel safe going back to the place where you live?: Yes      Need for Family Participation in Patient Care: Yes (Comment) Care giver support system in place?: Yes (comment) Current home services: DME (home oxygen with adapt, neb machine, rollator) Criminal Activity/Legal Involvement Pertinent to Current  Situation/Hospitalization: No - Comment as needed  Activities of Daily Living   ADL Screening (condition at time of admission) Independently performs ADLs?: Yes (appropriate for developmental age) Is the patient deaf or have difficulty hearing?: No Does the patient have difficulty seeing, even when wearing glasses/contacts?: Yes Does the patient have difficulty concentrating, remembering, or making decisions?: Yes  Permission Sought/Granted Permission sought to share information with : Case Manager Permission granted to share information with : Yes, Verbal Permission Granted     Permission granted to share info w AGENCY: HH        Emotional Assessment Appearance:: Appears stated age Attitude/Demeanor/Rapport: Engaged Affect (typically observed): Appropriate Orientation: : Oriented to Self, Oriented to Place, Oriented to  Time, Oriented to Situation Alcohol / Substance Use: Not Applicable Psych Involvement: No (comment)  Admission diagnosis:  Acute diastolic heart failure (HCC) [I50.31] Anasarca [R60.1] Acute pulmonary edema (HCC) [J81.0] Peripheral edema [R60.0] Acute on chronic congestive heart failure, unspecified heart failure type [facility]) [I50.9] Patient Active Problem List   Diagnosis Date Noted   Acute diastolic heart failure (HCC) 04/07/2023   Peripheral edema 04/07/2023   Acute hypoxemic respiratory failure (HCC) 03/15/2023   Acute hypoxic respiratory failure (HCC) 03/14/2023   DM2 (diabetes mellitus, type 2) (HCC) 03/14/2023   Elevated troponin 03/14/2023   COPD with acute exacerbation (HCC) 03/12/2023   Acute respiratory failure (HCC) 03/12/2023   Elevated diaphragm 03/12/2023   History of  TIA (transient ischemic attack) 03/12/2023   Hypercoagulable state due to paroxysmal atrial fibrillation (HCC) 05/08/2022   Acute CVA (cerebrovascular accident) (HCC) 04/13/2022   COPD (chronic obstructive pulmonary disease) (HCC) 01/18/2021   Chronic pulmonary hypertension  (HCC) 06/15/2019   Dyspnea 01/06/2019   Paroxysmal atrial fibrillation (HCC) 01/24/2018   S/P AVR 12/05/2017   Thrombocytopenia (HCC) 11/13/2017   Other neutropenia (HCC) 11/13/2017   Numbness in feet 10/14/2017   Severe aortic stenosis    Inguinal hernia 10/22/2016   Obesity (BMI 30.0-34.9) 04/26/2016   Lower extremity edema 06/22/2015   Status post bariatric surgery 03/29/2014   Aortic valve disorder 04/21/2013   Anasarca 04/21/2013   Depression    OSA (obstructive sleep apnea)    Sjogren's syndrome (HCC)    Cirrhosis (HCC)    Hyperlipidemia    Fibromyalgia    Postmenopausal bleeding 12/11/2012   Endometrial polyp 12/11/2012   NASH (nonalcoholic steatohepatitis) 08/06/2012   Chronic cough 04/11/2011   PCP:  Daisy Floro, MD Pharmacy:   [phone] [place] [place] - [address] AT [facility] [address] [place] [address] Phone: [phone] Fax: [phone]     Social Determinants of Health (SDOH) Social History: SDOH Screenings   Food Insecurity: No Food Insecurity (04/08/2023)  Housing: Low Risk  (04/08/2023)  Transportation Needs: No Transportation Needs (04/08/2023)  Utilities: Not At Risk (04/08/2023)  Depression (PHQ2-9): Low Risk  (09/20/2020)  Recent Concern: Depression (PHQ2-9) - Medium Risk (07/11/2020)  Tobacco Use: Low Risk  (04/07/2023)   SDOH Interventions:     Readmission Risk Interventions     No data to display

## 2023-04-08 NOTE — Evaluation (Signed)
Physical Therapy Evaluation Patient Details Name: Whitney [name] MRN: [id number] DOB: [birth year] Today's Date: 04/08/2023  History of Present Illness  67 y.o. female presents to [facility] hospital on 04/07/2023 with SOB and worsening peripgeral edema. PMH includes NASH cirrhosis, DM2, PAF on eliquis, sjogren's syndrome, severe AS s/p AVR, OSA on CPAP.  Clinical Impression  Pt presents to PT with deficits in cardiopulmonary function and endurance. Pt reports mild DOE when ambulating, SpO2 does remain stable on 3L [place] when with a reliable pleth waveform. Pt is encouraged to mobilize frequently in an effort to improve activity tolerance. Pt does have a rollator, but she does not utilize it much. Reinforcement on energy conservation methods will be beneficial in upcoming sessions. PT recommends continued HHPT at the time of discharge.        If plan is discharge home, recommend the following: A little help with bathing/dressing/bathroom;Assistance with cooking/housework;Assist for transportation;Help with stairs or ramp for entrance   Can travel by private vehicle        Equipment Recommendations None recommended by PT  Recommendations for Other Services       Functional Status Assessment Patient has had a recent decline in their functional status and demonstrates the ability to make significant improvements in function in a reasonable and predictable amount of time.     Precautions / Restrictions Precautions Precautions: Fall Precaution Comments: monitor SpO2, 3L [place] chronically Restrictions Weight Bearing Restrictions: No      Mobility  Bed Mobility Overal bed mobility: Modified Independent                  Transfers Overall transfer level: Needs assistance Equipment used: None Transfers: Sit to/from Stand Sit to Stand: Supervision                Ambulation/Gait Ambulation/Gait assistance: Supervision Gait Distance (Feet): 200 Feet Assistive device: None Gait  Pattern/deviations: Step-through pattern Gait velocity: functional Gait velocity interpretation: 1.31 - 2.62 ft/sec, indicative of limited community ambulator   General Gait Details: slowed step-through gait  Stairs            Wheelchair Mobility     Tilt Bed    Modified Rankin (Stroke Patients Only)       Balance Overall balance assessment: Needs assistance Sitting-balance support: No upper extremity supported, Feet supported Sitting balance-Leahy Scale: Good     Standing balance support: No upper extremity supported, During functional activity Standing balance-Leahy Scale: Good                               Pertinent Vitals/Pain Pain Assessment Pain Assessment: No/denies pain    Home Living Family/patient expects to be discharged to:: Private residence Living Arrangements: Children Available Help at Discharge: Family;Available 24 hours/day Type of Home: House Home Access: Stairs to enter Entrance Stairs-Rails: None Entrance Stairs-Number of Steps: 2 steps + 2 steps   Home Layout: One level;Laundry or work area in basement (1 step down into [employer]) [employer]: Grab bars - toilet;Grab bars - tub/shower;Shower seat;Rollator (4 wheels)      Prior Function Prior Level of Function : Driving;Independent/Modified Independent             Mobility Comments: pt reports typically ambulating without DME, does utilize rollator PRN       Extremity/Trunk Assessment   Upper Extremity Assessment Upper Extremity Assessment: Overall WFL for tasks assessed    Lower Extremity Assessment Lower Extremity Assessment: Generalized  weakness (BLE pittind edema)    Cervical / Trunk Assessment Cervical / Trunk Assessment: Other exceptions Cervical / Trunk Exceptions: body habitus  Communication   Communication Communication: No apparent difficulties Cueing Techniques: Verbal cues  Cognition Arousal: Alert Behavior During Therapy: WFL for tasks  assessed/performed Overall Cognitive Status: Within Functional Limits for tasks assessed                                          General Comments General comments (skin integrity, edema, etc.): pt mobilizes on 3L [place], sats maintain 92% and above when pleth has a reliable reading    Exercises     Assessment/Plan    PT Assessment Patient needs continued PT services  PT Problem List Decreased activity tolerance;Cardiopulmonary status limiting activity;Decreased balance       PT Treatment Interventions DME instruction;Gait training;Stair training;Functional mobility training;Balance training;Neuromuscular re-education;Patient/family education;Therapeutic exercise;Therapeutic activities    PT Goals (Current goals can be found in the Care Plan section)  Acute Rehab PT Goals Patient Stated Goal: to return to prior level of function, improve activity tolerance PT Goal Formulation: With patient Time For Goal Achievement: 04/22/23 Potential to Achieve Goals: Good Additional Goals Additional Goal #1: Pt will report 0/4 DOE when ambulating for at least 250' to demonstrate improved activity tolerance    Frequency Min 1X/week     Co-evaluation               AM-PAC PT "6 Clicks" Mobility  Outcome Measure Help needed turning from your back to your side while in a flat bed without using bedrails?: None Help needed moving from lying on your back to sitting on the side of a flat bed without using bedrails?: None Help needed moving to and from a bed to a chair (including a wheelchair)?: A Little Help needed standing up from a chair using your arms (e.g., wheelchair or bedside chair)?: A Little Help needed to walk in hospital room?: A Little Help needed climbing 3-5 steps with a railing? : A Lot 6 Click Score: 19    End of Session Equipment Utilized During Treatment: Oxygen Activity Tolerance: Patient tolerated treatment well Patient left: in bed;with call bell/phone  within reach Nurse Communication: Mobility status PT Visit Diagnosis: Other abnormalities of gait and mobility (R26.89)    Time: 1244-1310 PT Time Calculation (min) (ACUTE ONLY): 26 min   Charges:   PT Evaluation $PT Eval Low Complexity: 1 Low   PT General Charges $$ ACUTE PT VISIT: 1 Visit         Arlyss Gandy, PT, DPT Acute Rehabilitation Office [phone]   Arlyss Gandy 04/08/2023, 3:00 PM

## 2023-04-08 NOTE — Telephone Encounter (Signed)
Spoke to patient and relayed below message/recommendations. She is currently admitted.  She wanted to make Dr. Delton Coombes aware.  She will keep scheduled appt for 1/23. She is aware that she has been added to cancellation list.  Nothing further needed.

## 2023-04-08 NOTE — ED Notes (Signed)
ED TO INPATIENT HANDOFF REPORT  ED Nurse Name and Phone #: Vernona Rieger [phone]  S Name/Age/Gender Whitney [name] 67 y.o. female Room/Bed: 046C/046C  Code Status   Code Status: Full Code  Home/SNF/Other Home Patient oriented to: self, place, time, and situation Is this baseline? Yes   Triage Complete: Triage complete  Chief Complaint Acute diastolic heart failure (HCC) [I50.31] Peripheral edema [R60.0] Anasarca [R60.1]  Triage Note Pt BIB [facility] from home d/t bil lower extremity edema for the past week & a half & abd distention. Also reports pain in both legs as well stating it feels like her legs will "split open." Has been seen recently here & treated for upper respiratory infection & pt denies having seen her PCP for the edema she arrives with here today. A/Ox4 was 130/90, 72 bpm, 80% on RA & with 3L O2 via n/c she was 97%.    Allergies Allergies  Allergen Reactions   Prednisone     Other Reaction(s): delerium   Doxycycline Hives and Rash   Naproxen Rash and Hives    Level of Care/Admitting Diagnosis ED Disposition     ED Disposition  Admit   Condition  --   Comment  Hospital [facility] [[id number]]  Level of Care: Telemetry Cardiac [[id number]]  May admit patient to [facility] or [facility] if equivalent level of care is available:: No  Covid Evaluation: Asymptomatic - no recent exposure (last 10 days) testing not required  Diagnosis: Anasarca [[id number]]  Admitting Physician: Katha Cabal [[id number]]  Attending Physician: Katha Cabal [[id number]]  Certification:: I certify there are rare and unusual circumstances requiring inpatient admission          B Medical/Surgery History Past Medical History:  Diagnosis Date   Allergic rhinitis    Anemia    hx   Arthritis    Asthma    hx yrs ago   Cirrhosis (HCC) last albumin 3.3 done at [facility] 06-16-2014 (under care everywhere tab in epic)   Secondary to Fatty liver --  followed by hepatology at [facility]  (dr Ardine Eng)    Depression    Diabetes mellitus type II    type 2 diet conrolled   Dyspnea    Fibromyalgia    GERD (gastroesophageal reflux disease)    Heart murmur    asymptomatic ---  1989 from rhuematic fever   History of exercise stress test    05-05-2013---  negative bruce ETT given exercise workload,  no ischemia   History of hiatal hernia    History of kidney stones    History of rheumatic fever    1989   Hyperlipidemia    Leukocytopenia    Moderate aortic stenosis    AVA area 1.1cm2---  cardiologist --  dr Lannie Fields, 2014 in epic   NASH (nonalcoholic steatohepatitis)    OSA (obstructive sleep apnea)    was using CPAP before gastric sleeve 2015--  no uses after wt loss   Pneumonia    hx   Sjogren's syndrome (HCC)    Thrombocytopenia (HCC)    Past Surgical History:  Procedure Laterality Date   AORTIC VALVE REPLACEMENT N/A 12/05/2017   Procedure: AORTIC VALVE REPLACEMENT (AVR) TISSUE VALVE INSPIRIS;  Surgeon: Alleen Borne, MD;  Location: [facility] OR;  Service: Open Heart Surgery;  Laterality: N/A;   COLONOSCOPY WITH ESOPHAGOGASTRODUODENOSCOPY (EGD)     CYSTOSCOPY WITH RETROGRADE PYELOGRAM, URETEROSCOPY AND STENT PLACEMENT Left 01/21/2015   Procedure: CYSTOSCOPY WITH LEFT  RETROGRADE  PYELOGRAM, LEFT URETEROSCOPY AND STENT PLACEMENT;  Surgeon: Jerilee Field, MD;  Location[facility];  Service: Urology;  Laterality: Left;   CYSTOSCOPY WITH RETROGRADE PYELOGRAM, URETEROSCOPY AND STENT PLACEMENT Bilateral 02/24/2015   Procedure: CYSTOSCOPY WITH RIGHT RETROGRADE PYELOGRAM, BLADDER BIOPSY FULGERATION LEFT URETEROSCOPY AND STENT REPLACEMENT;  Surgeon: Jerilee Field, MD;  Location: [facility];  Service: Urology;  Laterality: Bilateral;   EXPLORATORY LAPAROSCOPY W/  CONE BIOPSY'S LEFT AND RIGHT LOBE OF LIVER  11-04-2007   HOLMIUM LASER APPLICATION Left 02/24/2015   Procedure: HOLMIUM LASER LITHOTRIPSY;  Surgeon: Jerilee Field, MD;  Location:  [facility];  Service: Urology;  Laterality: Left;   HYSTEROSCOPY WITH D & C N/A 12/11/2012   Procedure: DILATATION AND CURETTAGE /HYSTEROSCOPY;  Surgeon: Dorien Chihuahua. Richardson Dopp, MD;  Location: [facility];  Service: Gynecology;  Laterality: N/A;   INGUINAL HERNIA REPAIR Left 10/22/2016   Procedure: LEFT INGUINAL HERNIA REPAIR;  Surgeon: Emelia Loron, MD;  Location: [facility] OR;  Service: General;  Laterality: Left;  TAP BLOCK   INSERTION OF MESH Left 10/22/2016   Procedure: INSERTION OF MESH;  Surgeon: Emelia Loron, MD;  Location: [facility] OR;  Service: General;  Laterality: Left;   LAPAROSCOPIC GASTRIC SLEEVE RESECTION  07-27-2013   LOOP RECORDER INSERTION N/A 04/16/2022   Procedure: LOOP RECORDER INSERTION;  Surgeon: [facility] Salvia, MD;  Location: [facility] INVASIVE CV LAB;  Service: Cardiovascular;  Laterality: N/A;   PUBOVAGINAL SLING  04-10-2001   SPARC   RIGHT/LEFT HEART CATH AND CORONARY ANGIOGRAPHY N/A 08/23/2017   Procedure: RIGHT/LEFT HEART CATH AND CORONARY ANGIOGRAPHY;  Surgeon: Corky Crafts, MD;  Location: [facility] INVASIVE CV LAB;  Service: Cardiovascular;  Laterality: N/A;   TEE WITHOUT CARDIOVERSION N/A 12/05/2017   Procedure: TRANSESOPHAGEAL ECHOCARDIOGRAM (TEE);  Surgeon: Alleen Borne, MD;  Location: [facility] OR;  Service: Open Heart Surgery;  Laterality: N/A;   TONSILLECTOMY  1975   TRANSTHORACIC ECHOCARDIOGRAM  06-04-2012  dr Eldridge Dace   mild LVH,  grade I diastolic dysfunction/  ef 60-65%/  moderate LAE/  mild MV calcifation without stenosis,  mild MR/  moderate AV stenosis,  cannot r/o bicupsid, area 1.1cm2/  mild dilated aortic root/  trivial TR     A IV Location/Drains/Wounds Patient Lines/Drains/Airways Status     Active Line/Drains/Airways     Name Placement date Placement time Site Days   Peripheral IV 04/07/23 22 G 1.75" Anterior;Left Forearm 04/07/23  1830  Forearm  1            Intake/Output Last 24 hours No intake or output data in the 24 hours ending 04/08/23  0846  Labs/Imaging Results for orders placed or performed during the hospital encounter of 04/07/23 (from the past 48 hour(s))  Comprehensive metabolic panel     Status: Abnormal   Collection Time: 04/07/23  4:03 PM  Result Value Ref Range   Sodium 143 135 - 145 mmol/L   Potassium 4.4 3.5 - 5.1 mmol/L    Comment: HEMOLYSIS AT THIS LEVEL MAY AFFECT RESULT   Chloride 100 98 - 111 mmol/L   CO2 34 (H) 22 - 32 mmol/L   Glucose, Bld 143 (H) 70 - 99 mg/dL    Comment: Glucose reference range applies only to samples taken after fasting for at least 8 hours.   BUN 14 8 - 23 mg/dL   Creatinine, Ser 4.09 0.44 - 1.00 mg/dL   Calcium 8.4 (L) 8.9 - 10.3 mg/dL   Total Protein 5.5 (L) 6.5 -  8.1 g/dL   Albumin 2.3 (L) 3.5 - 5.0 g/dL   AST 34 15 - 41 U/L    Comment: HEMOLYSIS AT THIS LEVEL MAY AFFECT RESULT   ALT 11 0 - 44 U/L    Comment: HEMOLYSIS AT THIS LEVEL MAY AFFECT RESULT   Alkaline Phosphatase 49 38 - 126 U/L   Total Bilirubin 1.0 <1.2 mg/dL    Comment: HEMOLYSIS AT THIS LEVEL MAY AFFECT RESULT   GFR, Estimated >60 >60 mL/min    Comment: (NOTE) Calculated using the CKD-EPI Creatinine Equation (2021)    Anion gap 9 5 - 15    Comment: Performed at [facility], [address], [place], [place] [address]  Brain natriuretic peptide     Status: Abnormal   Collection Time: 04/07/23  4:03 PM  Result Value Ref Range   B Natriuretic Peptide 515.8 (H) 0.0 - 100.0 pg/mL    Comment: Performed at [facility], [address], [place], [place] [address]  CBC with Differential     Status: Abnormal   Collection Time: 04/07/23  4:03 PM  Result Value Ref Range   WBC 2.5 (L) 4.0 - 10.5 K/uL   RBC 4.23 3.87 - 5.11 MIL/uL   Hemoglobin 13.0 12.0 - 15.0 g/dL   HCT 09.8 11.9 - 14.7 %   MCV 100.0 80.0 - 100.0 fL   MCH 30.7 26.0 - 34.0 pg   MCHC 30.7 30.0 - 36.0 g/dL   RDW 82.9 56.2 - 13.0 %   Platelets 48 (L) 150 - 400 K/uL    Comment: SPECIMEN CHECKED FOR CLOTS Immature Platelet Fraction  may be clinically indicated, consider ordering this additional test [id number] REPEATED TO VERIFY PLATELET COUNT CONFIRMED BY SMEAR    nRBC 0.0 0.0 - 0.2 %   Neutrophils Relative % 31 %   Neutro Abs 0.8 (L) 1.7 - 7.7 K/uL   Lymphocytes Relative 47 %   Lymphs Abs 1.2 0.7 - 4.0 K/uL   Monocytes Relative 15 %   Monocytes Absolute 0.4 0.1 - 1.0 K/uL   Eosinophils Relative 6 %   Eosinophils Absolute 0.2 0.0 - 0.5 K/uL   Basophils Relative 1 %   Basophils Absolute 0.0 0.0 - 0.1 K/uL   WBC Morphology MORPHOLOGY UNREMARKABLE    RBC Morphology MORPHOLOGY UNREMARKABLE    Smear Review PLATELETS APPEAR DECREASED    Immature Granulocytes 0 %   Abs Immature Granulocytes 0.01 0.00 - 0.07 K/uL    Comment: Performed at [facility], [address], [place], [place] [address]  Troponin I (High Sensitivity)     Status: None   Collection Time: 04/07/23  4:03 PM  Result Value Ref Range   Troponin I (High Sensitivity) 13 <18 ng/L    Comment: (NOTE) Elevated high sensitivity troponin I (hsTnI) values and significant  changes across serial measurements may suggest ACS but many other  chronic and acute conditions are known to elevate hsTnI results.  Refer to the "Links" section for chest pain algorithms and additional  guidance. Performed at [facility], [address] [place], [place] [address]   Protime-INR     Status: Abnormal   Collection Time: 04/07/23  4:03 PM  Result Value Ref Range   Prothrombin Time 21.6 (H) 11.4 - 15.2 seconds   INR 1.9 (H) 0.8 - 1.2    Comment: (NOTE) INR goal varies based on device and disease states. Performed at [facility], [address],  [place], [place] [address]   Troponin I (High Sensitivity)     Status: None   Collection Time: 04/07/23  6:59 PM  Result Value Ref Range   Troponin I (High Sensitivity) 15 <18 ng/L    Comment: (NOTE) Elevated high sensitivity troponin I (hsTnI) values and significant  changes across serial measurements  may suggest ACS but many other  chronic and acute conditions are known to elevate hsTnI results.  Refer to the "Links" section for chest pain algorithms and additional  guidance. Performed at [facility], [address], [place], [place] [address]   Urinalysis, Routine w reflex microscopic -Urine, Clean Catch     Status: Abnormal   Collection Time: 04/07/23  8:00 PM  Result Value Ref Range   Color, Urine YELLOW YELLOW   APPearance HAZY (A) CLEAR   Specific Gravity, Urine 1.009 1.005 - 1.030   pH 6.0 5.0 - 8.0   Glucose, UA NEGATIVE NEGATIVE mg/dL   Hgb urine dipstick SMALL (A) NEGATIVE   Bilirubin Urine NEGATIVE NEGATIVE   Ketones, ur NEGATIVE NEGATIVE mg/dL   Protein, ur NEGATIVE NEGATIVE mg/dL   Nitrite NEGATIVE NEGATIVE   Leukocytes,Ua NEGATIVE NEGATIVE   RBC / HPF 0-5 0 - 5 RBC/hpf   WBC, UA 0-5 0 - 5 WBC/hpf   Bacteria, UA RARE (A) NONE SEEN   Squamous Epithelial / HPF 11-20 0 - 5 /HPF   Mucus PRESENT     Comment: Performed at [facility], [address], [place], [place] [address]  Hemoglobin A1c     Status: Abnormal   Collection Time: 04/08/23  5:22 AM  Result Value Ref Range   Hgb A1c MFr Bld 5.8 (H) 4.8 - 5.6 %    Comment: (NOTE) Pre diabetes:          5.7%-6.4%  Diabetes:              >6.4%  Glycemic control for   <7.0% adults with diabetes    Mean Plasma Glucose 119.76 mg/dL    Comment: Performed at [facility], [address] [place], [place] [address]  Comprehensive metabolic panel     Status: Abnormal   Collection Time: 04/08/23  5:22 AM  Result Value Ref Range   Sodium 143 135 - 145 mmol/L   Potassium 4.3 3.5 - 5.1 mmol/L    Comment: HEMOLYSIS AT THIS LEVEL MAY AFFECT RESULT   Chloride 100 98 - 111 mmol/L   CO2 34 (H) 22 - 32 mmol/L   Glucose, Bld 106 (H) 70 - 99 mg/dL    Comment: Glucose reference range applies only to samples taken after fasting for at least 8 hours.   BUN 14 8 - 23 mg/dL   Creatinine, Ser 0.10 0.44 - 1.00  mg/dL   Calcium 8.5 (L) 8.9 - 10.3 mg/dL   Total Protein 5.7 (L) 6.5 - 8.1 g/dL   Albumin 2.2 (L) 3.5 - 5.0 g/dL   AST 32 15 - 41 U/L    Comment: HEMOLYSIS AT THIS LEVEL MAY AFFECT RESULT   ALT 13 0 - 44 U/L    Comment: HEMOLYSIS AT THIS LEVEL MAY AFFECT RESULT   Alkaline Phosphatase 51 38 - 126 U/L   Total Bilirubin 0.8 <1.2 mg/dL    Comment: HEMOLYSIS AT THIS LEVEL MAY AFFECT RESULT   GFR, Estimated >60 >60 mL/min    Comment: (NOTE) Calculated using the CKD-EPI Creatinine Equation (2021)    Anion gap 9 5 - 15  Comment: Performed at [facility], [address], [place], [place] [address]  CBC     Status: Abnormal   Collection Time: 04/08/23  5:22 AM  Result Value Ref Range   WBC 2.3 (L) 4.0 - 10.5 K/uL   RBC 4.07 3.87 - 5.11 MIL/uL   Hemoglobin 12.5 12.0 - 15.0 g/dL   HCT 64.3 32.9 - 51.8 %   MCV 102.0 (H) 80.0 - 100.0 fL   MCH 30.7 26.0 - 34.0 pg   MCHC 30.1 30.0 - 36.0 g/dL   RDW 84.1 66.0 - 63.0 %   Platelets 44 (L) 150 - 400 K/uL    Comment: Immature Platelet Fraction may be clinically indicated, consider ordering this additional test [id number] REPEATED TO VERIFY    nRBC 0.0 0.0 - 0.2 %    Comment: Performed at [facility], [address], [place], [place] [address]   DG Chest Port 1 View  Result Date: 04/07/2023 CLINICAL DATA:  Fluid overload EXAM: PORTABLE CHEST 1 VIEW COMPARISON:  Chest radiograph dated 03/13/2023 FINDINGS: Lines/tubes: Implanted loop recorder projects over the medial left mid chest. Lungs: Asymmetric elevation of the right hemidiaphragm, unchanged. Low lung volumes with bronchovascular crowding. Mild bilateral interstitial opacities. Pleura: No pneumothorax or pleural effusion. Heart/mediastinum: Similar enlarged, postsurgical cardiomediastinal silhouette. Bones: Median sternotomy wires are nondisplaced. IMPRESSION: 1. Mild bilateral interstitial opacities, which may represent pulmonary edema. 2. Similar cardiomegaly. Electronically  Signed   By: Agustin Cree M.D.   On: 04/07/2023 17:23    Pending Labs Unresulted Labs (From admission, onward)     Start     Ordered   04/07/23 1603  Pathologist smear review  Once,   R        04/07/23 1603            Vitals/Pain Today's Vitals   04/08/23 0400 04/08/23 0830 04/08/23 0832 04/08/23 0833  BP: 123/76 130/75 130/75   Pulse: 64 70 70   Resp: (!) 31 19 18    Temp: 98.2 F (36.8 C)  98.1 F (36.7 C) 98.4 F (36.9 C)  TempSrc: Oral  Oral Oral  SpO2: 100% 98% 99%   Weight:      Height:      PainSc:        Isolation Precautions No active isolations  Medications Medications  ezetimibe (ZETIA) tablet 10 mg (10 mg Oral Given 04/07/23 2332)  nadolol (CORGARD) tablet 40 mg (40 mg Oral Given 04/07/23 2332)  gabapentin (NEURONTIN) capsule 100 mg (has no administration in time range)    And  gabapentin (NEURONTIN) capsule 300 mg (300 mg Oral Given 04/07/23 2215)  albuterol (PROVENTIL) (2.5 MG/3ML) 0.083% nebulizer solution 2.5 mg (has no administration in time range)  arformoterol (BROVANA) nebulizer solution 15 mcg (15 mcg Nebulization Given 04/08/23 0830)    And  umeclidinium bromide (INCRUSE ELLIPTA) 62.5 MCG/ACT 1 puff (has no administration in time range)  ondansetron (ZOFRAN) tablet 4 mg (has no administration in time range)    Or  ondansetron (ZOFRAN) injection 4 mg (has no administration in time range)  furosemide (LASIX) injection 40 mg (has no administration in time range)  furosemide (LASIX) injection 40 mg (40 mg Intravenous Given 04/07/23 1859)    Mobility walks     Focused Assessments Pulmonary Assessment Handoff:  Lung sounds: Bilateral Breath Sounds: Clear, Diminished L Breath Sounds: Diminished R Breath Sounds: Diminished O2 Device: Room Air O2 Flow Rate (L/min): 3 L/min    R Recommendations: See Admitting  Provider Note  Report given to:   Additional Notes: Ambulates with steady gait

## 2023-04-09 DIAGNOSIS — I480 Paroxysmal atrial fibrillation: Secondary | ICD-10-CM | POA: Diagnosis not present

## 2023-04-09 DIAGNOSIS — I272 Pulmonary hypertension, unspecified: Secondary | ICD-10-CM | POA: Diagnosis not present

## 2023-04-09 DIAGNOSIS — K7581 Nonalcoholic steatohepatitis (NASH): Secondary | ICD-10-CM | POA: Diagnosis not present

## 2023-04-09 DIAGNOSIS — K7469 Other cirrhosis of liver: Secondary | ICD-10-CM | POA: Diagnosis not present

## 2023-04-09 DIAGNOSIS — R601 Generalized edema: Secondary | ICD-10-CM | POA: Diagnosis not present

## 2023-04-09 LAB — GLUCOSE, CAPILLARY
Glucose-Capillary: 87 mg/dL (ref 70–99)
Glucose-Capillary: 99 mg/dL (ref 70–99)
Glucose-Capillary: 105 mg/dL — ABNORMAL HIGH (ref 70–99)
Glucose-Capillary: 114 mg/dL — ABNORMAL HIGH (ref 70–99)

## 2023-04-09 LAB — CBC
HCT: 42.5 % (ref 36.0–46.0)
Hemoglobin: 13.2 g/dL (ref 12.0–15.0)
MCH: 30.8 pg (ref 26.0–34.0)
MCHC: 31.1 g/dL (ref 30.0–36.0)
MCV: 99.3 fL (ref 80.0–100.0)
Platelets: 45 10*3/uL — ABNORMAL LOW (ref 150–400)
RBC: 4.28 MIL/uL (ref 3.87–5.11)
RDW: 15 % (ref 11.5–15.5)
WBC: 2.7 10*3/uL — ABNORMAL LOW (ref 4.0–10.5)
nRBC: 0 % (ref 0.0–0.2)

## 2023-04-09 LAB — BASIC METABOLIC PANEL
Anion gap: 6 (ref 5–15)
BUN: 11 mg/dL (ref 8–23)
CO2: 41 mmol/L — ABNORMAL HIGH (ref 22–32)
Calcium: 8.6 mg/dL — ABNORMAL LOW (ref 8.9–10.3)
Chloride: 96 mmol/L — ABNORMAL LOW (ref 98–111)
Creatinine, Ser: 0.79 mg/dL (ref 0.44–1.00)
GFR, Estimated: >60 mL/min (ref >60)
Glucose, Bld: 88 mg/dL (ref 70–99)
Potassium: 3.9 mmol/L (ref 3.5–5.1)
Sodium: 143 mmol/L (ref 135–145)

## 2023-04-09 MED ORDER — HYDRALAZINE HCL 20 MG/ML IJ SOLN: 10.0000 mg | INTRAMUSCULAR | Status: DC | PRN

## 2023-04-09 MED ORDER — ACETAMINOPHEN 325 MG PO TABS
650.0000 mg | ORAL_TABLET | Freq: Four times a day (QID) | ORAL | Status: DC | PRN
  Administered 2023-04-09: 650 mg via ORAL
  Filled 2023-04-09: qty 2

## 2023-04-09 MED ORDER — SPIRONOLACTONE 25 MG PO TABS
25.0000 mg | ORAL_TABLET | Freq: Every day | ORAL | Status: DC
  Administered 2023-04-09 – 2023-04-14 (×6): 25 mg via ORAL
  Filled 2023-04-09 (×6): qty 1

## 2023-04-09 MED ORDER — TRAZODONE HCL 50 MG PO TABS: 50.0000 mg | ORAL_TABLET | Freq: Every evening | ORAL | Status: DC | PRN

## 2023-04-09 MED ORDER — METOPROLOL TARTRATE 5 MG/5ML IV SOLN
5.0000 mg | INTRAVENOUS | Status: DC | PRN
  Administered 2023-04-09: 5 mg via INTRAVENOUS
  Filled 2023-04-09 (×2): qty 5

## 2023-04-09 MED ORDER — SENNOSIDES-DOCUSATE SODIUM 8.6-50 MG PO TABS: 1.0000 | ORAL_TABLET | Freq: Every evening | ORAL | Status: DC | PRN

## 2023-04-09 MED ORDER — GUAIFENESIN 100 MG/5ML PO LIQD: 5.0000 mL | ORAL | Status: DC | PRN

## 2023-04-09 MED ORDER — IPRATROPIUM-ALBUTEROL 0.5-2.5 (3) MG/3ML IN SOLN: 3.0000 mL | RESPIRATORY_TRACT | Status: DC | PRN

## 2023-04-09 NOTE — Progress Notes (Signed)
Patient shower call light came on and staff went to answer. Staff found patient in the process of taking a shower. Patient requested for a supply of towels. Patient had earlier taken off her telemetry box. RN educated patient regarding calling for help at all times, and the need to notify staff before taking shower so that staff can wrap patient's PIV site to avoid moisture and potential infection. Patient verbalized understanding. MD will be notified, in order to place order for patient to shower. Elnita Maxwell, RN

## 2023-04-09 NOTE — Progress Notes (Signed)
Mobility Specialist Progress Note:    04/09/23 1226  Mobility  Activity Ambulated with assistance in hallway  Level of Assistance Standby assist, set-up cues, supervision of patient - no hands on  Assistive Device None  Distance Ambulated (ft) 400 ft  Activity Response Tolerated well  Mobility Referral Yes  $Mobility charge 1 Mobility  Mobility Specialist Start Time (ACUTE ONLY) 1120  Mobility Specialist Stop Time (ACUTE ONLY) 1134  Mobility Specialist Time Calculation (min) (ACUTE ONLY) 14 min   Received pt in bed having no complaints and agreeable to mobility. Pt was asymptomatic throughout ambulation. Ambulated on 3L/min, VSS. Returned to room w/o fault. Left in bed w/ call bell in reach and all needs met.   Thompson Grayer Mobility Specialist  Please contact vis Secure Chat or  Rehab Office [phone]

## 2023-04-09 NOTE — Progress Notes (Signed)
PROGRESS NOTE    Whitney [name]  VHQ:[id number] DOB: Jun 22, 1955 DOA: 04/07/2023 PCP: Daisy Floro, MD    Brief Narrative:  67 y.o. female with medical history significant of NASH cirrhosis, severe aortic stenosis status post AVR, type 2 diabetes diet-controlled, PAF (Eliquis), Sjogren syndrome, sleep apnea but no longer on CPAP as she uses 3 to 4 L nasal cannula due to recently diagnosed with COPD but is a non-smoker who presented for shortness of breath with worsening peripheral edema.    Assessment & Plan:  Principal Problem:   Anasarca Active Problems:   Cirrhosis (HCC)   Paroxysmal atrial fibrillation (HCC)   Sjogren's syndrome (HCC)   Hyperlipidemia   Severe aortic stenosis   S/P AVR   DM2 (diabetes mellitus, type 2) (HCC)   NASH (nonalcoholic steatohepatitis)   Thrombocytopenia (HCC)   Other neutropenia (HCC)     Bilateral lower extremity swelling Pulmonary edema, mild There is concerns of some volume overload in the setting of Nash cirrhosis.  For now plan is to continue diuresis, monitor urine output and electrolytes.   Aortic valve stenosis status post supportive valve replacement.  Pulmonary hypertension Echo performed 03/14/2023 showing LV function 65 to 70%, moderate LV concentric hypertrophy, moderately reduced RV systolic function, severely elevated pulmonary arterial systolic pressure with dilated left and right atria and prosthesis patient mismatch with mildly elevated gradients.  Cardiology team is currently following. Planning RHC on 12/5   NASH cirrhosis Follows with [facility].  Continue diuretics - Liver ultrasound shows hepatic steatosis   Chronic respiratory failure History of COPD, recent diagnosis Follows outpatient pulmonology.  Continue supportive care.  As needed bronchodilators.   Non-insulin-dependent type 2 diabetes Reports of diet control. Has been prescribed Ozempic likely for weight loss.  A1c is 5.8.   Paroxysmal A-fib Nadolol and  Eliquis.  IV as needed   Thrombocytopenia, neutropenia Per gi note has been evaluated by heme and these thought to be 2/2 cirrhosis   Sjogren syndrome She has switching to new rheumatologist here at home.  There is concern for pulmonary arterial hypertension based on previous echo.   Hyperlipidemia Continue home Zetia     DVT prophylaxis: home apixaban Code Status: full Family Communication:    Level of care: Telemetry Cardiac Status is: Inpatient Remains inpatient appropriate because: severity of illness        Subjective: Doing ok, no complaints  LE swelling is better on both the sides.    Examination:  General exam: Appears calm and comfortable  Respiratory system: Clear to auscultation. Respiratory effort normal. Cardiovascular system: S1 & S2 heard, RRR. No JVD, murmurs, rubs, gallops or clicks. 2+ b/l LE pitting edema.  Gastrointestinal system: Abdomen is nondistended, soft and nontender. No organomegaly or masses felt. Normal bowel sounds heard. Central nervous system: Alert and oriented. No focal neurological deficits. Extremities: Symmetric 5 x 5 power. Skin: No rashes, lesions or ulcers Psychiatry: Judgement and insight appear normal. Mood & affect appropriate.                Diet Orders (From admission, onward)     Start     Ordered   04/07/23 2054  Diet 2 gram sodium Room service appropriate? Yes; Fluid consistency: Thin  Diet effective now       Question Answer Comment  Room service appropriate? Yes   Fluid consistency: Thin      04/07/23 2054            Objective: Vitals:  04/09/23 0505 04/09/23 0524 04/09/23 0819 04/09/23 0852  BP: (!) 157/89  (!) 149/86   Pulse: 63  72   Resp: 18  (!) 24 20  Temp: 98.1 F (36.7 C)  (!) 101.3 F (38.5 C) 100 F (37.8 C)  TempSrc: Oral  Oral   SpO2: 99%  91%   Weight:  108.9 kg    Height:        Intake/Output Summary (Last 24 hours) at 04/09/2023 1404 Last data filed at 04/09/2023  0046 Gross per 24 hour  Intake 240 ml  Output 1650 ml  Net -1410 ml   Filed Weights   04/07/23 1443 04/08/23 1000 04/09/23 0524  Weight: 97.5 kg 110.8 kg 108.9 kg    Scheduled Meds:  apixaban  5 mg Oral BID   arformoterol  15 mcg Nebulization BID   And   umeclidinium bromide  1 puff Inhalation Daily   ezetimibe  10 mg Oral QHS   furosemide  40 mg Intravenous BID   gabapentin  100 mg Oral BID   And   gabapentin  300 mg Oral QHS   insulin aspart  0-15 Units Subcutaneous TID WC   insulin aspart  0-5 Units Subcutaneous QHS   nadolol  40 mg Oral QHS   spironolactone  25 mg Oral Daily   Continuous Infusions:  Nutritional status     Body mass index is 41.21 kg/m.  Data Reviewed:   CBC: Recent Labs  Lab 04/07/23 1603 04/08/23 0522 04/09/23 0249  WBC 2.5* 2.3* 2.7*  NEUTROABS 0.8*  --   --   HGB 13.0 12.5 13.2  HCT 42.3 41.5 42.5  MCV 100.0 102.0* 99.3  PLT 48* 44* 45*   Basic Metabolic Panel: Recent Labs  Lab 04/07/23 1603 04/08/23 0522 04/09/23 0249  NA 143 143 143  K 4.4 4.3 3.9  CL 100 100 96*  CO2 34* 34* 41*  GLUCOSE 143* 106* 88  BUN 14 14 11   CREATININE 0.78 0.90 0.79  CALCIUM 8.4* 8.5* 8.6*   GFR: Estimated Creatinine Clearance: 80 mL/min (by C-G formula based on SCr of 0.79 mg/dL). Liver Function Tests: Recent Labs  Lab 04/07/23 1603 04/08/23 0522  AST 34 32  ALT 11 13  ALKPHOS 49 51  BILITOT 1.0 0.8  PROT 5.5* 5.7*  ALBUMIN 2.3* 2.2*   No results for input(s): "LIPASE", "AMYLASE" in the last 168 hours. No results for input(s): "AMMONIA" in the last 168 hours. Coagulation Profile: Recent Labs  Lab 04/07/23 1603  INR 1.9*   Cardiac Enzymes: No results for input(s): "CKTOTAL", "CKMB", "CKMBINDEX", "TROPONINI" in the last 168 hours. BNP (last 3 results) Recent Labs    03/13/23 1428  PROBNP 996.0*   HbA1C: Recent Labs    04/08/23 0522  HGBA1C 5.8*   CBG: Recent Labs  Lab 04/08/23 1158 04/08/23 1613 04/08/23 2059  04/09/23 0550 04/09/23 1117  GLUCAP 90 80 164* 87 99   Lipid Profile: No results for input(s): "CHOL", "HDL", "LDLCALC", "TRIG", "CHOLHDL", "LDLDIRECT" in the last 72 hours. Thyroid Function Tests: No results for input(s): "TSH", "T4TOTAL", "FREET4", "T3FREE", "THYROIDAB" in the last 72 hours. Anemia Panel: No results for input(s): "VITAMINB12", "FOLATE", "FERRITIN", "TIBC", "IRON", "RETICCTPCT" in the last 72 hours. Sepsis Labs: No results for input(s): "PROCALCITON", "LATICACIDVEN" in the last 168 hours.  No results found for this or any previous visit (from the past 240 hour(s)).       Radiology Studies: US Abdomen Limited  Result Date: 04/08/2023 CLINICAL  DATA:  Edema. EXAM: ULTRASOUND ABDOMEN LIMITED RIGHT UPPER QUADRANT COMPARISON:  Sep 19, 2017 FINDINGS: Gallbladder: No gallstones are visualized. The gallbladder wall measures 5.4 mm in thickness. No sonographic Murphy sign noted by sonographer. Common bile duct: Diameter: 4.0 mm Liver: No focal lesion identified. The liver parenchyma is nodular in contour and increased in echogenicity. Portal vein is patent on color Doppler imaging with normal direction of blood flow towards the liver. Other: None. IMPRESSION: Hepatic steatosis and hepatic cirrhosis without focal liver lesions. Electronically Signed   By: Aram Candela M.D.   On: 04/08/2023 21:44   DG Chest Port 1 View  Result Date: 04/07/2023 CLINICAL DATA:  Fluid overload EXAM: PORTABLE CHEST 1 VIEW COMPARISON:  Chest radiograph dated 03/13/2023 FINDINGS: Lines/tubes: Implanted loop recorder projects over the medial left mid chest. Lungs: Asymmetric elevation of the right hemidiaphragm, unchanged. Low lung volumes with bronchovascular crowding. Mild bilateral interstitial opacities. Pleura: No pneumothorax or pleural effusion. Heart/mediastinum: Similar enlarged, postsurgical cardiomediastinal silhouette. Bones: Median sternotomy wires are nondisplaced. IMPRESSION: 1. Mild  bilateral interstitial opacities, which may represent pulmonary edema. 2. Similar cardiomegaly. Electronically Signed   By: Agustin Cree M.D.   On: 04/07/2023 17:23           LOS: 2 days   Time spent= 35 mins    Miguel Rota, MD [facility]  If 7PM-7AM, please contact night-coverage  04/09/2023, 2:04 PM

## 2023-04-09 NOTE — Plan of Care (Signed)
  Problem: Education: Goal: Ability to describe self-care measures that may prevent or decrease complications (Diabetes Survival Skills Education) will improve Outcome: Progressing   Problem: Coping: Goal: Ability to adjust to condition or change in health will improve Outcome: Progressing   Problem: Fluid Volume: Goal: Ability to maintain a balanced intake and output will improve Outcome: Progressing   Problem: Health Behavior/Discharge Planning: Goal: Ability to identify and utilize available resources and services will improve Outcome: Progressing   Problem: Health Behavior/Discharge Planning: Goal: Ability to manage health-related needs will improve Outcome: Progressing   Problem: Nutritional: Goal: Maintenance of adequate nutrition will improve Outcome: Progressing

## 2023-04-09 NOTE — Plan of Care (Signed)
  Problem: Fluid Volume: Goal: Ability to maintain a balanced intake and output will improve Outcome: Progressing   Problem: Metabolic: Goal: Ability to maintain appropriate glucose levels will improve Outcome: Progressing   

## 2023-04-09 NOTE — Hospital Course (Addendum)
Brief Narrative:  67 y.o. female with medical history significant of NASH cirrhosis, severe aortic stenosis status post AVR, type 2 diabetes diet-controlled, PAF (Eliquis), Sjogren syndrome, sleep apnea but no longer on CPAP as she uses 3 to 4 L nasal cannula due to recently diagnosed with COPD but is a non-smoker who presented for shortness of breath with worsening peripheral edema.  RHC was performed on 12/5 which showed elevated right-sided pressure, normal wedge pressure with high cardiac output.  There was concerns of portal pulmonary hypertension.  Recommended diuresis, VQ scan and high-resolution CT.   Assessment & Plan:  Principal Problem:   Anasarca Active Problems:   Cirrhosis (HCC)   Paroxysmal atrial fibrillation (HCC)   Sjogren's syndrome (HCC)   Hyperlipidemia   Severe aortic stenosis   S/P AVR   DM2 (diabetes mellitus, type 2) (HCC)   NASH (nonalcoholic steatohepatitis)   Thrombocytopenia (HCC)   Other neutropenia (HCC)     Bilateral lower extremity swelling Pulmonary edema, mild There is concerns of some volume overload in the setting of Nash cirrhosis.  For now plan is to continue diuresis, monitor urine output and electrolytes.  Short-term use of Diamox due to rising bicarb prn   Aortic valve stenosis status post supportive valve replacement.  Pulmonary hypertension Echo performed 03/14/2023 showing LV function 65 to 70%, moderate LV concentric hypertrophy, moderately reduced RV systolic function, severely elevated pulmonary arterial systolic pressure with dilated left and right atria and prosthesis patient mismatch with mildly elevated gradients.  RHC showing concerns of portal pulmonary HTN with elevated right-sided heart pressures.  Continue diuresis, VQ scan and high-resolution CT-pending results.   NASH cirrhosis Follows with [facility].  Continue diuretics - Liver ultrasound shows hepatic steatosis. Had Hx of Small EV on EGD 2021, due to repeat EGD per outptn [facility] records.    Chronic respiratory failure History of COPD, recent diagnosis Follows outpatient pulmonology.  Continue supportive care.  As needed bronchodilators.   Non-insulin-dependent type 2 diabetes Reports of diet control. Has been prescribed Ozempic likely for weight loss.  A1c is 5.8.   Paroxysmal A-fib Nadolol and Eliquis.  IV as needed   Thrombocytopenia, neutropenia Per gi note has been evaluated by heme and these thought to be 2/2 cirrhosis   Sjogren syndrome She has switching to new rheumatologist here at home.  There is concern for pulmonary arterial hypertension based on previous echo.   Hyperlipidemia Continue home Zetia  HypoMg - Repletion     DVT prophylaxis: home apixaban Code Status: full Family Communication:    Level of care: Telemetry Cardiac Status is: Inpatient Remains inpatient appropriate because: severity of illness.  Discharge once cleared by cardiology        Subjective: No new complaints during my evaluation. Examination:  General exam: Appears calm and comfortable  Respiratory system: Clear to auscultation. Respiratory effort normal. Cardiovascular system: S1 & S2 heard, RRR. No JVD, murmurs, rubs, gallops or clicks. 2+ b/l LE pitting edema.  Gastrointestinal system: Abdomen is nondistended, soft and nontender. No organomegaly or masses felt. Normal bowel sounds heard. Central nervous system: Alert and oriented. No focal neurological deficits. Extremities: Symmetric 5 x 5 power. Skin: No rashes, lesions or ulcers Psychiatry: Judgement and insight appear normal. Mood & affect appropriate.

## 2023-04-09 NOTE — Progress Notes (Signed)
Rounding Note    Patient Name: Whitney [name] Date of Encounter: 04/09/2023  [facility] Cardiologist: Charlton Haws, MD   Subjective   No acute events overnight. Noted that she spiked a fever this AM. Has chronic cough, minimally productive, but no new symptoms. Reviewed plans, see below, all questions answered.  Inpatient Medications    Scheduled Meds:  apixaban  5 mg Oral BID   arformoterol  15 mcg Nebulization BID   And   umeclidinium bromide  1 puff Inhalation Daily   ezetimibe  10 mg Oral QHS   furosemide  40 mg Intravenous BID   gabapentin  100 mg Oral BID   And   gabapentin  300 mg Oral QHS   insulin aspart  0-15 Units Subcutaneous TID WC   insulin aspart  0-5 Units Subcutaneous QHS   nadolol  40 mg Oral QHS   spironolactone  25 mg Oral Daily   Continuous Infusions:  PRN Meds: acetaminophen, guaiFENesin, hydrALAZINE, ipratropium-albuterol, metoprolol tartrate, ondansetron **OR** ondansetron (ZOFRAN) IV, senna-docusate, traZODone   Vital Signs    Vitals:   04/09/23 0505 04/09/23 0524 04/09/23 0819 04/09/23 0852  BP: (!) 157/89  (!) 149/86   Pulse: 63  72   Resp: 18  (!) 24 20  Temp: 98.1 F (36.7 C)  (!) 101.3 F (38.5 C) 100 F (37.8 C)  TempSrc: Oral  Oral   SpO2: 99%  91%   Weight:  108.9 kg    Height:        Intake/Output Summary (Last 24 hours) at 04/09/2023 1102 Last data filed at 04/09/2023 0046 Gross per 24 hour  Intake 240 ml  Output 1650 ml  Net -1410 ml      04/09/2023    5:24 AM 04/08/2023   10:00 AM 04/07/2023    2:43 PM  Last 3 Weights  Weight (lbs) 240 lb 1.3 oz 244 lb 4.3 oz 215 lb  Weight (kg) 108.9 kg 110.8 kg 97.523 kg      Telemetry    SR - Personally Reviewed  Physical Exam   GEN: No acute distress.  [place] in place Neck: +JVD low neck Cardiac: RRR, no rubs, or gallops. 2/6 systolic murmur Respiratory: Clear to auscultation bilaterally in upper fields but diminished at bilateral bases; known elevated R  hemidiaphragm GI: Soft, nontender, non-distended  MS: 2+ bilateral LE edema to upper calves; No deformity. Neuro:  Nonfocal  Psych: Normal affect   New pertinent results (labs, ECG, imaging, cardiac studies)    None today  Patient Profile     67 y.o. female notable PMH NASH cirrhosis followed by [facility], chronic thrombocytopenia, Sjogren's syndrome, AS s/p surgical bioprosthetic AVR in 2019, paroxysmal atrial fibrillation whom we are consulted on for edema/concern for heart failure   Assessment & Plan    Lower extremity edema [name] cirrhosis -Exam does not appear to be consistent with anasarca, though with her chronic liver disease she does have low albumin which would predispose her to this -Her prior echo did show severely elevated PA pressures concerning for pulmonary hypertension. -Continue diuresis for now.  Made 1650 cc urine yesterday, net negative 1L -We discussed RHC today. With her unclear picture, I think this would be helpful, and she agrees. Will aim for more diuresis first, potential RHC 12/5. Would need brachial given her thrombocytopenia -CO2 up slightly, if rises further may need to pause diuresis. Acetazolamide contraindicated in cirrhosis. Will add spironolactone to see if K shift helps with bicarb -  abdominal ultrasound with steatosis/cirrhosis, patent portal vein with normal flow -admission weight 110.8, today's weight 108.9 kg   Chronic respiratory failure on home oxygen Pulmonary hypertension Chronically elevated right hemidiaphragm -Prior pulmonary workup noted severe restrictive lung disease, probable obstructive disease without response to bronchodilators -Did not have severe mitral stenosis or severe mitral regurgitation on echo, and LVEF was normal.  Unable to assess diastolic function due to exuberant mitral annular calcification.  Overall this supports that pulmonary hypertension is less likely to be from left-sided heart failure versus from other etiology.   Does also have autoimmune disease as a potential etiology.   Aortic stenosis status post bioprosthetic AVR -Mildly elevated gradient, thought to be due to patient prosthesis mismatch   Paroxysmal atrial fibrillation Thrombocytopenia -CHA2DS2/VAS Stroke Risk Points=6  -Currently in sinus rhythm -Was on Eliquis prior to admission, but this has been held given her chronic thrombocytopenia. However, Hgb stable. Continue to monitor.    Signed, Jodelle Red, MD  04/09/2023, 11:02 AM

## 2023-04-10 DIAGNOSIS — K7469 Other cirrhosis of liver: Secondary | ICD-10-CM | POA: Diagnosis not present

## 2023-04-10 DIAGNOSIS — K7581 Nonalcoholic steatohepatitis (NASH): Secondary | ICD-10-CM | POA: Diagnosis not present

## 2023-04-10 DIAGNOSIS — Z952 Presence of prosthetic heart valve: Secondary | ICD-10-CM | POA: Diagnosis not present

## 2023-04-10 DIAGNOSIS — R601 Generalized edema: Secondary | ICD-10-CM | POA: Diagnosis not present

## 2023-04-10 DIAGNOSIS — I272 Pulmonary hypertension, unspecified: Secondary | ICD-10-CM | POA: Diagnosis not present

## 2023-04-10 LAB — GLUCOSE, CAPILLARY
Glucose-Capillary: 100 mg/dL — ABNORMAL HIGH (ref 70–99)
Glucose-Capillary: 107 mg/dL — ABNORMAL HIGH (ref 70–99)
Glucose-Capillary: 112 mg/dL — ABNORMAL HIGH (ref 70–99)
Glucose-Capillary: 143 mg/dL — ABNORMAL HIGH (ref 70–99)

## 2023-04-10 LAB — MAGNESIUM: Magnesium: 1.5 mg/dL — ABNORMAL LOW (ref 1.7–2.4)

## 2023-04-10 LAB — CBC
HCT: 40.4 % (ref 36.0–46.0)
Hemoglobin: 12.7 g/dL (ref 12.0–15.0)
MCH: 31.1 pg (ref 26.0–34.0)
MCHC: 31.4 g/dL (ref 30.0–36.0)
MCV: 99 fL (ref 80.0–100.0)
Platelets: 42 10*3/uL — ABNORMAL LOW (ref 150–400)
RBC: 4.08 MIL/uL (ref 3.87–5.11)
RDW: 14.9 % (ref 11.5–15.5)
WBC: 2.7 10*3/uL — ABNORMAL LOW (ref 4.0–10.5)
nRBC: 0 % (ref 0.0–0.2)

## 2023-04-10 LAB — BASIC METABOLIC PANEL
Anion gap: 4 — ABNORMAL LOW (ref 5–15)
BUN: 18 mg/dL (ref 8–23)
CO2: 43 mmol/L — ABNORMAL HIGH (ref 22–32)
Calcium: 8.4 mg/dL — ABNORMAL LOW (ref 8.9–10.3)
Chloride: 93 mmol/L — ABNORMAL LOW (ref 98–111)
Creatinine, Ser: 0.88 mg/dL (ref 0.44–1.00)
GFR, Estimated: >60 mL/min (ref >60)
Glucose, Bld: 119 mg/dL — ABNORMAL HIGH (ref 70–99)
Potassium: 4.1 mmol/L (ref 3.5–5.1)
Sodium: 140 mmol/L (ref 135–145)

## 2023-04-10 LAB — PHOSPHORUS: Phosphorus: 4.3 mg/dL (ref 2.5–4.6)

## 2023-04-10 MED ORDER — MAGNESIUM SULFATE 2 GM/50ML IV SOLN
2.0000 g | Freq: Once | INTRAVENOUS | Status: AC
  Administered 2023-04-10: 2 g via INTRAVENOUS
  Filled 2023-04-10: qty 50

## 2023-04-10 MED ORDER — SODIUM CHLORIDE 0.9 % IV SOLN: INTRAVENOUS | Status: AC | PRN

## 2023-04-10 MED ORDER — MAGNESIUM OXIDE -MG SUPPLEMENT 400 (240 MG) MG PO TABS
800.0000 mg | ORAL_TABLET | Freq: Two times a day (BID) | ORAL | Status: AC
  Administered 2023-04-10 (×2): 800 mg via ORAL
  Filled 2023-04-10 (×2): qty 2

## 2023-04-10 NOTE — Care Management Important Message (Signed)
Important Message  Patient Details  Name: Whitney [name] MRN: [id number] Date of Birth: 1956-02-11   Important Message Given:  Yes - Medicare IM     Dorena Bodo 04/10/2023, 3:12 PM

## 2023-04-10 NOTE — Progress Notes (Signed)
[facility] night cross cover note:   I was notified by RN that this patient is refusing her evening Eliquis, which she is taking for thromboembolic prophylaxis in the setting of a history of paroxysmal atrial fibrillation.  The patient conveys that she took her evening dose of Eliquis earlier today, during this afternoon, while in the hospital.  However, there is no documentation to support this.  This absence of documentation to support her complaint of taking her evening Eliquis dose this afternoon was reviewed with the patient by her RN.  Following these discussions, the patient continues to refuse her scheduled evening dose of Eliquis.  This appears to be her first missed dose of Eliquis during the current hospitalization.    Newton Pigg, DO Hospitalist

## 2023-04-10 NOTE — Progress Notes (Signed)
PROGRESS NOTE    Whitney [name]  NWG:[id number] DOB: [birth year] DOA: 04/07/2023 PCP: Daisy Floro, MD    Brief Narrative:  67 y.o. female with medical history significant of NASH cirrhosis, severe aortic stenosis status post AVR, type 2 diabetes diet-controlled, PAF (Eliquis), Sjogren syndrome, sleep apnea but no longer on CPAP as she uses 3 to 4 L nasal cannula due to recently diagnosed with COPD but is a non-smoker who presented for shortness of breath with worsening peripheral edema.  Cardiology planning on RHC 12/5.   Assessment & Plan:  Principal Problem:   Anasarca Active Problems:   Cirrhosis (HCC)   Paroxysmal atrial fibrillation (HCC)   Sjogren's syndrome (HCC)   Hyperlipidemia   Severe aortic stenosis   S/P AVR   DM2 (diabetes mellitus, type 2) (HCC)   NASH (nonalcoholic steatohepatitis)   Thrombocytopenia (HCC)   Other neutropenia (HCC)     Bilateral lower extremity swelling Pulmonary edema, mild There is concerns of some volume overload in the setting of Nash cirrhosis.  For now plan is to continue diuresis, monitor urine output and electrolytes.   Aortic valve stenosis status post supportive valve replacement.  Pulmonary hypertension Echo performed 03/14/2023 showing LV function 65 to 70%, moderate LV concentric hypertrophy, moderately reduced RV systolic function, severely elevated pulmonary arterial systolic pressure with dilated left and right atria and prosthesis patient mismatch with mildly elevated gradients.  Cardiology team is currently following. Planning RHC on 12/5   NASH cirrhosis Follows with [facility].  Continue diuretics - Liver ultrasound shows hepatic steatosis   Chronic respiratory failure History of COPD, recent diagnosis Follows outpatient pulmonology.  Continue supportive care.  As needed bronchodilators.   Non-insulin-dependent type 2 diabetes Reports of diet control. Has been prescribed Ozempic likely for weight loss.  A1c is 5.8.    Paroxysmal A-fib Nadolol and Eliquis.  IV as needed   Thrombocytopenia, neutropenia Per gi note has been evaluated by heme and these thought to be 2/2 cirrhosis   Sjogren syndrome She has switching to new rheumatologist here at home.  There is concern for pulmonary arterial hypertension based on previous echo.   Hyperlipidemia Continue home Zetia  HypoMg - Repletion     DVT prophylaxis: home apixaban Code Status: full Family Communication:    Level of care: Telemetry Cardiac Status is: Inpatient Remains inpatient appropriate because: severity of illness        Subjective: No complaints.  Compression stocking on in BL LE   Examination:  General exam: Appears calm and comfortable  Respiratory system: Clear to auscultation. Respiratory effort normal. Cardiovascular system: S1 & S2 heard, RRR. No JVD, murmurs, rubs, gallops or clicks. 2+ b/l LE pitting edema.  Gastrointestinal system: Abdomen is nondistended, soft and nontender. No organomegaly or masses felt. Normal bowel sounds heard. Central nervous system: Alert and oriented. No focal neurological deficits. Extremities: Symmetric 5 x 5 power. Skin: No rashes, lesions or ulcers Psychiatry: Judgement and insight appear normal. Mood & affect appropriate.                Diet Orders (From admission, onward)     Start     Ordered   04/11/23 0001  Diet NPO time specified Except for: Sips with Meds  Diet effective midnight       Comments: NPO for solid foods after midnight, may have clear liquids until 5am, then NPO (this would be for inpatients and outpatients)  Question:  Except for  Answer:  Sips with Meds  04/10/23 0931   04/07/23 2054  Diet 2 gram sodium Room service appropriate? Yes; Fluid consistency: Thin  Diet effective now       Question Answer Comment  Room service appropriate? Yes   Fluid consistency: Thin      04/07/23 2054            Objective: Vitals:   04/10/23 0437 04/10/23  0458 04/10/23 0752 04/10/23 1121  BP: 123/74  123/70 (!) 126/91  Pulse: (!) 57  63 61  Resp: 17  18 20   Temp: 97.9 F (36.6 C)  98.1 F (36.7 C) 98 F (36.7 C)  TempSrc: Oral  Oral Oral  SpO2: 95%  94% 97%  Weight:  108.2 kg    Height:        Intake/Output Summary (Last 24 hours) at 04/10/2023 1237 Last data filed at 04/09/2023 2200 Gross per 24 hour  Intake 537 ml  Output 1800 ml  Net -1263 ml   Filed Weights   04/08/23 1000 04/09/23 0524 04/10/23 0458  Weight: 110.8 kg 108.9 kg 108.2 kg    Scheduled Meds:  arformoterol  15 mcg Nebulization BID   And   umeclidinium bromide  1 puff Inhalation Daily   ezetimibe  10 mg Oral QHS   furosemide  40 mg Intravenous BID   gabapentin  100 mg Oral BID   And   gabapentin  300 mg Oral QHS   insulin aspart  0-15 Units Subcutaneous TID WC   insulin aspart  0-5 Units Subcutaneous QHS   magnesium oxide  800 mg Oral BID   nadolol  40 mg Oral QHS   spironolactone  25 mg Oral Daily   Continuous Infusions:  sodium chloride 10 mL/hr at 04/10/23 1040    Nutritional status     Body mass index is 40.94 kg/m.  Data Reviewed:   CBC: Recent Labs  Lab 04/07/23 1603 04/08/23 0522 04/09/23 0249 04/10/23 0247  WBC 2.5* 2.3* 2.7* 2.7*  NEUTROABS 0.8*  --   --   --   HGB 13.0 12.5 13.2 12.7  HCT 42.3 41.5 42.5 40.4  MCV 100.0 102.0* 99.3 99.0  PLT 48* 44* 45* 42*   Basic Metabolic Panel: Recent Labs  Lab 04/07/23 1603 04/08/23 0522 04/09/23 0249 04/10/23 0247  NA 143 143 143 140  K 4.4 4.3 3.9 4.1  CL 100 100 96* 93*  CO2 34* 34* 41* 43*  GLUCOSE 143* 106* 88 119*  BUN 14 14 11 18   CREATININE 0.78 0.90 0.79 0.88  CALCIUM 8.4* 8.5* 8.6* 8.4*  MG  --   --   --  1.5*  PHOS  --   --   --  4.3   GFR: Estimated Creatinine Clearance: 72.5 mL/min (by C-G formula based on SCr of 0.88 mg/dL). Liver Function Tests: Recent Labs  Lab 04/07/23 1603 04/08/23 0522  AST 34 32  ALT 11 13  ALKPHOS 49 51  BILITOT 1.0 0.8  PROT  5.5* 5.7*  ALBUMIN 2.3* 2.2*   No results for input(s): "LIPASE", "AMYLASE" in the last 168 hours. No results for input(s): "AMMONIA" in the last 168 hours. Coagulation Profile: Recent Labs  Lab 04/07/23 1603  INR 1.9*   Cardiac Enzymes: No results for input(s): "CKTOTAL", "CKMB", "CKMBINDEX", "TROPONINI" in the last 168 hours. BNP (last 3 results) Recent Labs    03/13/23 1428  PROBNP 996.0*   HbA1C: Recent Labs    04/08/23 0522  HGBA1C 5.8*   CBG: Recent Labs  Lab 04/09/23 1117 04/09/23 1614 04/09/23 2102 04/10/23 0555 04/10/23 1118  GLUCAP 99 105* 114* 100* 107*   Lipid Profile: No results for input(s): "CHOL", "HDL", "LDLCALC", "TRIG", "CHOLHDL", "LDLDIRECT" in the last 72 hours. Thyroid Function Tests: No results for input(s): "TSH", "T4TOTAL", "FREET4", "T3FREE", "THYROIDAB" in the last 72 hours. Anemia Panel: No results for input(s): "VITAMINB12", "FOLATE", "FERRITIN", "TIBC", "IRON", "RETICCTPCT" in the last 72 hours. Sepsis Labs: No results for input(s): "PROCALCITON", "LATICACIDVEN" in the last 168 hours.  No results found for this or any previous visit (from the past 240 hour(s)).       Radiology Studies: US Abdomen Limited  Result Date: 04/08/2023 CLINICAL DATA:  Edema. EXAM: ULTRASOUND ABDOMEN LIMITED RIGHT UPPER QUADRANT COMPARISON:  Sep 19, 2017 FINDINGS: Gallbladder: No gallstones are visualized. The gallbladder wall measures 5.4 mm in thickness. No sonographic Murphy sign noted by sonographer. Common bile duct: Diameter: 4.0 mm Liver: No focal lesion identified. The liver parenchyma is nodular in contour and increased in echogenicity. Portal vein is patent on color Doppler imaging with normal direction of blood flow towards the liver. Other: None. IMPRESSION: Hepatic steatosis and hepatic cirrhosis without focal liver lesions. Electronically Signed   By: Aram Candela M.D.   On: 04/08/2023 21:44           LOS: 3 days   Time spent= 35  mins    Miguel Rota, MD [facility]  If 7PM-7AM, please contact night-coverage  04/10/2023, 12:37 PM

## 2023-04-10 NOTE — Plan of Care (Signed)

## 2023-04-10 NOTE — Progress Notes (Signed)
Rounding Note    Patient Name: Whitney [name] Date of Encounter: 04/10/2023  [facility] Cardiologist: Charlton Haws, MD   Subjective   Feeling somewhat better today. Reviewed plans for RHC, she is amenable. Son present by speakerphone as well.  Inpatient Medications    Scheduled Meds:  arformoterol  15 mcg Nebulization BID   And   umeclidinium bromide  1 puff Inhalation Daily   ezetimibe  10 mg Oral QHS   furosemide  40 mg Intravenous BID   gabapentin  100 mg Oral BID   And   gabapentin  300 mg Oral QHS   insulin aspart  0-15 Units Subcutaneous TID WC   insulin aspart  0-5 Units Subcutaneous QHS   magnesium oxide  800 mg Oral BID   nadolol  40 mg Oral QHS   spironolactone  25 mg Oral Daily   Continuous Infusions:  sodium chloride 10 mL/hr at 04/10/23 1040   PRN Meds: sodium chloride, acetaminophen, guaiFENesin, hydrALAZINE, ipratropium-albuterol, metoprolol tartrate, ondansetron **OR** ondansetron (ZOFRAN) IV, senna-docusate, traZODone   Vital Signs    Vitals:   04/10/23 0458 04/10/23 0752 04/10/23 1121 04/10/23 1616  BP:  123/70 (!) 126/91 (!) 142/88  Pulse:  63 61 66  Resp:  18 20 20   Temp:  98.1 F (36.7 C) 98 F (36.7 C) 98.4 F (36.9 C)  TempSrc:  Oral Oral Oral  SpO2:  94% 97% 94%  Weight: 108.2 kg     Height:        Intake/Output Summary (Last 24 hours) at 04/10/2023 1730 Last data filed at 04/09/2023 2200 Gross per 24 hour  Intake 537 ml  Output 1800 ml  Net -1263 ml      04/10/2023    4:58 AM 04/09/2023    5:24 AM 04/08/2023   10:00 AM  Last 3 Weights  Weight (lbs) 238 lb 8.6 oz 240 lb 1.3 oz 244 lb 4.3 oz  Weight (kg) 108.2 kg 108.9 kg 110.8 kg      Telemetry    SR - Personally Reviewed  Physical Exam   GEN: No acute distress.  Harrison in place Neck: +JVD low neck Cardiac: RRR, no rubs, or gallops. 2/6 systolic murmur Respiratory: Clear to auscultation bilaterally in upper fields but diminished at bilateral bases; known  elevated R hemidiaphragm GI: Soft, nontender, non-distended  MS: 2+ bilateral LE edema to upper calves; No deformity. Neuro:  Nonfocal  Psych: Normal affect   New pertinent results (labs, ECG, imaging, cardiac studies)    None today  Patient Profile     67 y.o. female notable PMH NASH cirrhosis followed by [facility], chronic thrombocytopenia, Sjogren's syndrome, AS s/p surgical bioprosthetic AVR in 2019, paroxysmal atrial fibrillation whom we are consulted on for edema/concern for heart failure   Assessment & Plan    Lower extremity edema [name] cirrhosis -Exam does not appear to be consistent with anasarca, though with her chronic liver disease she does have low albumin which would predispose her to this -Her prior echo did show severely elevated PA pressures concerning for pulmonary hypertension. -Made 1800 cc urine yesterday, net negative 2.4L -She is amenable to RHC, planned with Dr. Shirlee Latch. Hold apixaban tonight and tomorrow AM -CO2 continues to rise, 43 today. Acetazolamide contraindicated in cirrhosis. Added spironolactone 12/3 to see if K shift helps with bicarb -abdominal ultrasound with steatosis/cirrhosis, patent portal vein with normal flow -admission weight 110.8, today's weight 108.2 kg   Chronic respiratory failure on home oxygen Pulmonary  hypertension Chronically elevated right hemidiaphragm -Prior pulmonary workup noted severe restrictive lung disease, probable obstructive disease without response to bronchodilators -Did not have severe mitral stenosis or severe mitral regurgitation on echo, and LVEF was normal.  Unable to assess diastolic function due to exuberant mitral annular calcification.  Overall this supports that pulmonary hypertension is less likely to be from left-sided heart failure versus from other etiology.  Does also have autoimmune disease as a potential etiology.   Aortic stenosis status post bioprosthetic AVR -Mildly elevated gradient, thought to be due  to patient prosthesis mismatch   Paroxysmal atrial fibrillation Thrombocytopenia -CHA2DS2/VAS Stroke Risk Points=6  -Currently in sinus rhythm -declined apixaban last evening. Platelets 42, has been <50 this admission -holding apixaban for RHC 12/5  Informed Consent   Shared Decision Making/Informed Consent The risks [stroke (1 in 1000), death (1 in 1000), bleeding (1 in 200), allergic reaction [possibly serious] (1 in 200)], benefits (diagnostic support and management of coronary artery disease) and alternatives of a cardiac catheterization were discussed in detail with [name] and she is willing to proceed.      Additional time spent in review of chart today, to assist with decision making re: anticoagulation  I reviewed her initial heme/onc consult in 2019 in Care Everywhere (Dr. Allison Quarry). Pertinent points from the note: hypersplenism with cytopenia since 2014. She was cleared for AVR at that time. However, she was not on anticoagulation at the time of that consult.   She was noted to have post op afib after her AVR in 2019, but this resolved with amiodarone and did not recur. In 04/2022, an implantable loop recorder was placed for cryptogenic stroke concerning for embolic etiology, and this detected several hours of atrial fibrillation. She was seen by the afib clinic on 05/08/22, noted afib burden 0.2% and longest episode 4 hours, chadsvasc=6. Aspirin and plavix were stopped at that time, and apixaban 5 mg BID was started. Platelets at that time were 79k.  Note from Dr. Graciela Husbands from 07/09/22 recommended discussion with heme re: her anticoagulation and chronic thrombocytopenia. She was seen by Dr. Leonides Schanz on 08/02/22. Per his note, recommendation was to continue apixaban 5 mg BID given steady platelets and low bleeding risk. He did note that if platelets went below 50k the dose could be decreased to 2.5 mg apixaban BID as long as platelets remained above 25k. Recommended to return as needed.  This  admission, pharmacy consulted 12/2 and recommended apixaban 5 mg BID with close monitoring, platelets were in the 40s at that time.  She has not had any bleeding. With plans for potential RHC tomorrow, I will discuss the high complexity of her platelets/anticoagulation with Dr. Shirlee Latch so that we can plan appropriately.  After discussion, will plan to hold apixaban tonight and tomorrow AM, with brachial access for RHC.  Total time of encounter: I spent 60 minutes dedicated to the care of this patient on the date of this encounter to include  review of records, face-to-face time with the patient discussing conditions above, and clinical documentation with the electronic health record. Significant additional time was spent in communicating with Dr. Shirlee Latch to discuss anticoagulation and planning for potential right heart cath tomorrow.  Jodelle Red, MD, PhD, [facility]  [facility]  [place]  [facility] V[facility] at [facility] [address] [place], [place] [address] [phone]      Signed, Jodelle Red, MD  04/10/2023, 5:30 PM

## 2023-04-10 NOTE — H&P (View-Only) (Signed)
Rounding Note    Patient Name: Whitney [name] Date of Encounter: 04/10/2023  [facility] Cardiologist: Whitney Haws, MD   Subjective   Feeling somewhat better today. Reviewed plans for RHC, she is amenable. Son present by speakerphone as well.  Inpatient Medications    Scheduled Meds:  arformoterol  15 mcg Nebulization BID   And   umeclidinium bromide  1 puff Inhalation Daily   ezetimibe  10 mg Oral QHS   furosemide  40 mg Intravenous BID   gabapentin  100 mg Oral BID   And   gabapentin  300 mg Oral QHS   insulin aspart  0-15 Units Subcutaneous TID WC   insulin aspart  0-5 Units Subcutaneous QHS   magnesium oxide  800 mg Oral BID   nadolol  40 mg Oral QHS   spironolactone  25 mg Oral Daily   Continuous Infusions:  sodium chloride 10 mL/hr at 04/10/23 1040   PRN Meds: sodium chloride, acetaminophen, guaiFENesin, hydrALAZINE, ipratropium-albuterol, metoprolol tartrate, ondansetron **OR** ondansetron (ZOFRAN) IV, senna-docusate, traZODone   Vital Signs    Vitals:   04/10/23 0458 04/10/23 0752 04/10/23 1121 04/10/23 1616  BP:  123/70 (!) 126/91 (!) 142/88  Pulse:  63 61 66  Resp:  18 20 20   Temp:  98.1 F (36.7 C) 98 F (36.7 C) 98.4 F (36.9 C)  TempSrc:  Oral Oral Oral  SpO2:  94% 97% 94%  Weight: 108.2 kg     Height:        Intake/Output Summary (Last 24 hours) at 04/10/2023 1730 Last data filed at 04/09/2023 2200 Gross per 24 hour  Intake 537 ml  Output 1800 ml  Net -1263 ml      04/10/2023    4:58 AM 04/09/2023    5:24 AM 04/08/2023   10:00 AM  Last 3 Weights  Weight (lbs) 238 lb 8.6 oz 240 lb 1.3 oz 244 lb 4.3 oz  Weight (kg) 108.2 kg 108.9 kg 110.8 kg      Telemetry    SR - Personally Reviewed  Physical Exam   GEN: No acute distress.  Whitney [name] in place Neck: +JVD low neck Cardiac: RRR, no rubs, or gallops. 2/6 systolic murmur Respiratory: Clear to auscultation bilaterally in upper fields but diminished at bilateral bases; known  elevated R hemidiaphragm GI: Soft, nontender, non-distended  MS: 2+ bilateral LE edema to upper calves; No deformity. Neuro:  Nonfocal  Psych: Normal affect   New pertinent results (labs, ECG, imaging, cardiac studies)    None today  Patient Profile     67 y.o. female notable PMH NASH cirrhosis followed by [facility], chronic thrombocytopenia, Sjogren's syndrome, AS s/p surgical bioprosthetic AVR in 2019, paroxysmal atrial fibrillation whom we are consulted on for edema/concern for heart failure   Assessment & Plan    Lower extremity edema Whitney [name] cirrhosis -Exam does not appear to be consistent with anasarca, though with her chronic liver disease she does have low albumin which would predispose her to this -Her prior echo did show severely elevated PA pressures concerning for pulmonary hypertension. -Made 1800 cc urine yesterday, net negative 2.4L -She is amenable to RHC, planned with Dr. Shirlee [name]. Hold apixaban tonight and tomorrow AM -CO2 continues to rise, 43 today. Acetazolamide contraindicated in cirrhosis. Added spironolactone 12/3 to see if K shift helps with bicarb -abdominal ultrasound with steatosis/cirrhosis, patent portal vein with normal flow -admission weight 110.8, today's weight 108.2 kg   Chronic respiratory failure on home oxygen Pulmonary  hypertension Chronically elevated right hemidiaphragm -Prior pulmonary workup noted severe restrictive lung disease, probable obstructive disease without response to bronchodilators -Did not have severe mitral stenosis or severe mitral regurgitation on echo, and LVEF was normal.  Unable to assess diastolic function due to exuberant mitral annular calcification.  Overall this supports that pulmonary hypertension is less likely to be from left-sided heart failure versus from other etiology.  Does also have autoimmune disease as a potential etiology.   Aortic stenosis status post bioprosthetic AVR -Mildly elevated gradient, thought to be due  to patient prosthesis mismatch   Paroxysmal atrial fibrillation Thrombocytopenia -CHA2DS2/VAS Stroke Risk Points=6  -Currently in sinus rhythm -declined apixaban last evening. Platelets 42, has been <50 this admission -holding apixaban for RHC 12/5  Informed Consent   Shared Decision Making/Informed Consent The risks [stroke (1 in 1000), death (1 in 1000), bleeding (1 in 200), allergic reaction [possibly serious] (1 in 200)], benefits (diagnostic support and management of coronary artery disease) and alternatives of a cardiac catheterization were discussed in detail with Whitney [name] and she is willing to proceed.      Additional time spent in review of chart today, to assist with decision making re: anticoagulation  I reviewed her initial heme/onc consult in 2019 in Care Everywhere (Whitney [name]). Pertinent points from the note: hypersplenism with cytopenia since 2014. She was cleared for AVR at that time. However, she was not on anticoagulation at the time of that consult.   She was noted to have post op afib after her AVR in 2019, but this resolved with amiodarone and did not recur. In 04/2022, an implantable loop recorder was placed for cryptogenic stroke concerning for embolic etiology, and this detected several hours of atrial fibrillation. She was seen by the afib clinic on 05/08/22, noted afib burden 0.2% and longest episode 4 hours, chadsvasc=6. Aspirin and plavix were stopped at that time, and apixaban 5 mg BID was started. Platelets at that time were 79k.  Note from Whitney [name] from 07/09/22 recommended discussion with heme re: her anticoagulation and chronic thrombocytopenia. She was seen by Whitney [name] on 08/02/22. Per his note, recommendation was to continue apixaban 5 mg BID given steady platelets and low bleeding risk. He did note that if platelets went below 50k the dose could be decreased to 2.5 mg apixaban BID as long as platelets remained above 25k. Recommended to return as needed.  This  admission, pharmacy consulted 12/2 and recommended apixaban 5 mg BID with close monitoring, platelets were in the 40s at that time.  She has not had any bleeding. With plans for potential RHC tomorrow, I will discuss the high complexity of her platelets/anticoagulation with Dr. Shirlee [name] so that we can plan appropriately.  After discussion, will plan to hold apixaban tonight and tomorrow AM, with brachial access for RHC.  Total time of encounter: I spent 60 minutes dedicated to the care of this patient on the date of this encounter to include  review of records, face-to-face time with the patient discussing conditions above, and clinical documentation with the electronic health record. Significant additional time was spent in communicating with Dr. Shirlee [name] to discuss anticoagulation and planning for potential right heart cath tomorrow.  Jodelle Red, MD, PhD, [facility]  [facility]  [place]  [facility] at [facility] [address] [place], [place] [address] [phone]      Signed, Jodelle Red, MD  04/10/2023, 5:30 PM

## 2023-04-10 NOTE — Progress Notes (Signed)
Physical Therapy Treatment Patient Details Name: Whitney [name] MRN: [id number] DOB: 05-04-1956 Today's Date: 04/10/2023   History of Present Illness 67 y.o. female presents to [facility] hospital on 04/07/2023 with SOB and worsening peripgeral edema. PMH includes NASH cirrhosis, DM2, PAF on eliquis, sjogren's syndrome, severe AS s/p AVR, OSA on CPAP.    PT Comments  Focused session on pt and family education. Half way through educating them, pt declining OOB mobility or exercises at this time, stating she is fatigued from being up often this morning to pick up around the room etc. Provided pt and family with Energy Conservation handout and reviewed strategies to conserve energy while also progressing frequency and duration of mobility. Educated them on ankle pumps to manage edema, reducing sodium/processed foods intake, eating fresh/frozen fruits/veggies and rinsing canned foods, weighing self daily, sit <> stand exercises to address her deficits and weakness with this, and adjusting her furniture height as needed. Educated pt to mobilize with nursing staff later today. They verbalized understanding. Will continue to follow acutely.   If plan is discharge home, recommend the following: A little help with bathing/dressing/bathroom;Assistance with cooking/housework;Assist for transportation;Help with stairs or ramp for entrance   Can travel by private vehicle        Equipment Recommendations  None recommended by PT    Recommendations for Other Services       Precautions / Restrictions Precautions Precautions: Fall Precaution Comments: monitor SpO2, 3L [place] chronically Restrictions Weight Bearing Restrictions: No     Mobility  Bed Mobility               General bed mobility comments: Pt declined mobility or exercises but agreeable to education at this time.    Transfers                   General transfer comment: Pt declined mobility or exercises but agreeable to education at  this time.    Ambulation/Gait               General Gait Details: Pt declined mobility or exercises but agreeable to education at this time.   Stairs             Wheelchair Mobility     Tilt Bed    Modified Rankin (Stroke Patients Only)       Balance                                            Cognition Arousal: Alert Behavior During Therapy: WFL for tasks assessed/performed Overall Cognitive Status: Within Functional Limits for tasks assessed                                          Exercises      General Comments General comments (skin integrity, edema, etc.): Provided pt and family with Energy Conservation handout and reviewed strategies to conserve energy while also progressing frequency and duration of mobility. Educated them on ankle pumps to manage edema, reducing sodium/processed foods intake, eating fresh/frozen fruits/veggies and rinsing canned foods, weighing self daily, sit <> stand exercises to address her deficits and weakness with this, and adjusting her furniture height as needed. Educated pt to mobilize with nursing staff later today. They verbalized understanding.      Pertinent  Vitals/Pain Pain Assessment Pain Assessment: Faces Faces Pain Scale: No hurt Pain Intervention(s): Monitored during session    Home Living                          Prior Function            PT Goals (current goals can now be found in the care plan section) Acute Rehab PT Goals Patient Stated Goal: to return to prior level of function, improve activity tolerance PT Goal Formulation: With patient/family Time For Goal Achievement: 04/22/23 Potential to Achieve Goals: Good Progress towards PT goals: Progressing toward goals    Frequency    Min 1X/week      PT Plan      Co-evaluation              AM-PAC PT "6 Clicks" Mobility   Outcome Measure  Help needed turning from your back to your side  while in a flat bed without using bedrails?: None Help needed moving from lying on your back to sitting on the side of a flat bed without using bedrails?: None Help needed moving to and from a bed to a chair (including a wheelchair)?: A Little Help needed standing up from a chair using your arms (e.g., wheelchair or bedside chair)?: A Little Help needed to walk in hospital room?: A Little Help needed climbing 3-5 steps with a railing? : A Lot 6 Click Score: 19    End of Session Equipment Utilized During Treatment: Oxygen Activity Tolerance: Patient tolerated treatment well Patient left: in bed;with call bell/phone within reach;with family/visitor present   PT Visit Diagnosis: Other abnormalities of gait and mobility (R26.89)     Time: 5366-4403 PT Time Calculation (min) (ACUTE ONLY): 18 min  Charges:    $Therapeutic Activity: 8-22 mins PT General Charges $$ ACUTE PT VISIT: 1 Visit                     Virgil Benedict, PT, DPT Acute Rehabilitation Services  Office: [phone]    [name] 04/10/2023, 12:16 PM

## 2023-04-11 ENCOUNTER — Encounter (HOSPITAL_COMMUNITY)
Admission: EM | Disposition: A | Discharge status: Home Health | Payer: Self-pay | Source: Home / Self Care | Attending: Internal Medicine

## 2023-04-11 DIAGNOSIS — K7581 Nonalcoholic steatohepatitis (NASH): Secondary | ICD-10-CM | POA: Diagnosis not present

## 2023-04-11 DIAGNOSIS — I270 Primary pulmonary hypertension: Secondary | ICD-10-CM | POA: Diagnosis not present

## 2023-04-11 DIAGNOSIS — I272 Pulmonary hypertension, unspecified: Secondary | ICD-10-CM | POA: Diagnosis not present

## 2023-04-11 DIAGNOSIS — R601 Generalized edema: Secondary | ICD-10-CM | POA: Diagnosis not present

## 2023-04-11 DIAGNOSIS — K7469 Other cirrhosis of liver: Secondary | ICD-10-CM | POA: Diagnosis not present

## 2023-04-11 DIAGNOSIS — I480 Paroxysmal atrial fibrillation: Secondary | ICD-10-CM | POA: Diagnosis not present

## 2023-04-11 HISTORY — PX: RIGHT HEART CATH: CATH118263

## 2023-04-11 LAB — GLUCOSE, CAPILLARY
Glucose-Capillary: 103 mg/dL — ABNORMAL HIGH (ref 70–99)
Glucose-Capillary: 89 mg/dL (ref 70–99)
Glucose-Capillary: 88 mg/dL (ref 70–99)
Glucose-Capillary: 184 mg/dL — ABNORMAL HIGH (ref 70–99)

## 2023-04-11 LAB — POCT I-STAT 7, (LYTES, BLD GAS, ICA,H+H)
Acid-Base Excess: 20 mmol/L — ABNORMAL HIGH (ref 0.0–2.0)
Acid-Base Excess: 20 mmol/L — ABNORMAL HIGH (ref 0.0–2.0)
Acid-Base Excess: 20 mmol/L — ABNORMAL HIGH (ref 0.0–2.0)
Bicarbonate: 49.4 mmol/L — ABNORMAL HIGH (ref 20.0–28.0)
Bicarbonate: 49.8 mmol/L — ABNORMAL HIGH (ref 20.0–28.0)
Bicarbonate: 50.5 mmol/L — ABNORMAL HIGH (ref 20.0–28.0)
Calcium, Ion: 1.12 mmol/L — ABNORMAL LOW (ref 1.15–1.40)
Calcium, Ion: 1.14 mmol/L — ABNORMAL LOW (ref 1.15–1.40)
Calcium, Ion: 1.16 mmol/L (ref 1.15–1.40)
HCT: 41 % (ref 36.0–46.0)
HCT: 41 % (ref 36.0–46.0)
HCT: 41 % (ref 36.0–46.0)
Hemoglobin: 13.9 g/dL (ref 12.0–15.0)
Hemoglobin: 13.9 g/dL (ref 12.0–15.0)
Hemoglobin: 13.9 g/dL (ref 12.0–15.0)
O2 Saturation: 69 %
O2 Saturation: 71 %
O2 Saturation: 71 %
Potassium: 3.9 mmol/L (ref 3.5–5.1)
Potassium: 3.9 mmol/L (ref 3.5–5.1)
Potassium: 3.9 mmol/L (ref 3.5–5.1)
Sodium: 140 mmol/L (ref 135–145)
Sodium: 140 mmol/L (ref 135–145)
Sodium: 141 mmol/L (ref 135–145)
TCO2: >50 mmol/L — ABNORMAL HIGH (ref 22–32)
TCO2: >50 mmol/L — ABNORMAL HIGH (ref 22–32)
TCO2: >50 mmol/L — ABNORMAL HIGH (ref 22–32)
pCO2 arterial: 81 mm[Hg] (ref 32–48)
pCO2 arterial: 84.1 mm[Hg] (ref 32–48)
pCO2 arterial: 84.1 mm[Hg] (ref 32–48)
pH, Arterial: 7.381 (ref 7.35–7.45)
pH, Arterial: 7.387 (ref 7.35–7.45)
pH, Arterial: 7.394 (ref 7.35–7.45)
pO2, Arterial: 39 mm[Hg] — CL (ref 83–108)
pO2, Arterial: 41 mm[Hg] — ABNORMAL LOW (ref 83–108)
pO2, Arterial: 41 mm[Hg] — ABNORMAL LOW (ref 83–108)

## 2023-04-11 LAB — BASIC METABOLIC PANEL
Anion gap: 8 (ref 5–15)
BUN: 19 mg/dL (ref 8–23)
CO2: 44 mmol/L — ABNORMAL HIGH (ref 22–32)
Calcium: 8.5 mg/dL — ABNORMAL LOW (ref 8.9–10.3)
Chloride: 88 mmol/L — ABNORMAL LOW (ref 98–111)
Creatinine, Ser: 0.83 mg/dL (ref 0.44–1.00)
GFR, Estimated: >60 mL/min (ref >60)
Glucose, Bld: 96 mg/dL (ref 70–99)
Potassium: 4.2 mmol/L (ref 3.5–5.1)
Sodium: 140 mmol/L (ref 135–145)

## 2023-04-11 LAB — PROTIME-INR
INR: 1.6 — ABNORMAL HIGH (ref 0.8–1.2)
Prothrombin Time: 18.8 s — ABNORMAL HIGH (ref 11.4–15.2)

## 2023-04-11 LAB — CBC
HCT: 42.3 % (ref 36.0–46.0)
Hemoglobin: 13.3 g/dL (ref 12.0–15.0)
MCH: 30.4 pg (ref 26.0–34.0)
MCHC: 31.4 g/dL (ref 30.0–36.0)
MCV: 96.8 fL (ref 80.0–100.0)
Platelets: 44 10*3/uL — ABNORMAL LOW (ref 150–400)
RBC: 4.37 MIL/uL (ref 3.87–5.11)
RDW: 14.7 % (ref 11.5–15.5)
WBC: 3 10*3/uL — ABNORMAL LOW (ref 4.0–10.5)
nRBC: 0 % (ref 0.0–0.2)

## 2023-04-11 LAB — MAGNESIUM: Magnesium: 1.7 mg/dL (ref 1.7–2.4)

## 2023-04-11 SURGERY — RIGHT HEART CATH
Anesthesia: LOCAL

## 2023-04-11 MED ORDER — SODIUM CHLORIDE 0.9% FLUSH
10.0000 mL | Freq: Two times a day (BID) | INTRAVENOUS | Status: DC
  Administered 2023-04-11: 10 mL via INTRAVENOUS

## 2023-04-11 MED ORDER — SODIUM CHLORIDE 0.9% FLUSH: 3.0000 mL | Freq: Two times a day (BID) | INTRAVENOUS | Status: DC

## 2023-04-11 MED ORDER — SODIUM CHLORIDE 0.9% FLUSH: 3.0000 mL | INTRAVENOUS | Status: DC | PRN

## 2023-04-11 MED ORDER — LABETALOL HCL 5 MG/ML IV SOLN: 10.0000 mg | INTRAVENOUS | Status: AC | PRN

## 2023-04-11 MED ORDER — SODIUM CHLORIDE 0.9 % IV SOLN: 250.0000 mL | INTRAVENOUS | Status: DC | PRN

## 2023-04-11 MED ORDER — HYDRALAZINE HCL 20 MG/ML IJ SOLN: 10.0000 mg | INTRAMUSCULAR | Status: AC | PRN

## 2023-04-11 MED ORDER — ACETAMINOPHEN 325 MG PO TABS: 650.0000 mg | ORAL_TABLET | ORAL | Status: DC | PRN

## 2023-04-11 MED ORDER — ONDANSETRON HCL 4 MG/2ML IJ SOLN: 4.0000 mg | Freq: Four times a day (QID) | INTRAMUSCULAR | Status: DC | PRN

## 2023-04-11 MED ORDER — MAGNESIUM SULFATE 2 GM/50ML IV SOLN
2.0000 g | Freq: Once | INTRAVENOUS | Status: AC
  Administered 2023-04-11: 2 g via INTRAVENOUS
  Filled 2023-04-11: qty 50

## 2023-04-11 MED ORDER — HEPARIN (PORCINE) IN NACL 1000-0.9 UT/500ML-% IV SOLN
INTRAVENOUS | Status: DC | PRN
  Administered 2023-04-11: 500 mL

## 2023-04-11 MED ORDER — LIDOCAINE HCL (PF) 1 % IJ SOLN
INTRAMUSCULAR | Status: DC | PRN
  Administered 2023-04-11: 2 mL

## 2023-04-11 MED ORDER — APIXABAN 5 MG PO TABS
5.0000 mg | ORAL_TABLET | Freq: Two times a day (BID) | ORAL | Status: DC
  Administered 2023-04-11 – 2023-04-14 (×5): 5 mg via ORAL
  Filled 2023-04-11 (×6): qty 1

## 2023-04-11 MED ORDER — LIDOCAINE HCL (PF) 1 % IJ SOLN
INTRAMUSCULAR | Status: AC
  Filled 2023-04-11: qty 30

## 2023-04-11 SURGICAL SUPPLY — 6 items
CATH BALLN WEDGE 5F 110CM (CATHETERS) IMPLANT
PACK CARDIAC CATHETERIZATION (CUSTOM PROCEDURE TRAY) ×1 IMPLANT
SHEATH GLIDE SLENDER 4/5FR (SHEATH) IMPLANT
TRANSDUCER W/STOPCOCK (MISCELLANEOUS) IMPLANT
TUBING ART PRESS 72 MALE/FEM (TUBING) IMPLANT
WIRE EMERALD 3MM-J .025X260CM (WIRE) IMPLANT

## 2023-04-11 NOTE — Progress Notes (Signed)
Rounding Note    Patient Name: Whitney [name] Date of Encounter: 04/11/2023  [facility] Cardiologist: Whitney Haws, MD   Subjective   Seen post RHC, I discussed the results and recommendations with her.   Inpatient Medications    Scheduled Meds:  apixaban  5 mg Oral BID   arformoterol  15 mcg Nebulization BID   And   umeclidinium bromide  1 puff Inhalation Daily   ezetimibe  10 mg Oral QHS   furosemide  40 mg Intravenous BID   gabapentin  100 mg Oral BID   And   gabapentin  300 mg Oral QHS   insulin aspart  0-15 Units Subcutaneous TID WC   insulin aspart  0-5 Units Subcutaneous QHS   nadolol  40 mg Oral QHS   spironolactone  25 mg Oral Daily   Continuous Infusions:   PRN Meds: acetaminophen, guaiFENesin, hydrALAZINE, ipratropium-albuterol, labetalol, ondansetron **OR** ondansetron (ZOFRAN) IV, senna-docusate, traZODone   Vital Signs    Vitals:   04/11/23 1524 04/11/23 1529 04/11/23 1553 04/11/23 1630  BP: (!) 144/89 (!) 148/92 (!) 146/92 123/78  Pulse: 64 (!) 0 71 69  Resp: (!) 22   20  Temp:   97.7 F (36.5 C) 98.3 F (36.8 C)  TempSrc:   Oral Oral  SpO2: 91% 93% 95% 93%  Weight:      Height:        Intake/Output Summary (Last 24 hours) at 04/11/2023 1928 Last data filed at 04/11/2023 1902 Gross per 24 hour  Intake 780 ml  Output --  Net 780 ml      04/11/2023    4:42 AM 04/10/2023    4:58 AM 04/09/2023    5:24 AM  Last 3 Weights  Weight (lbs) 232 lb 3.2 oz 238 lb 8.6 oz 240 lb 1.3 oz  Weight (kg) 105.325 kg 108.2 kg 108.9 kg      Telemetry    SR - Personally Reviewed  Physical Exam   GEN: No acute distress.  Whitney [name] in place Neck: no JVD when standing up Cardiac: RRR, no rubs, or gallops. 2/6 systolic murmur Respiratory: Clear to auscultation bilaterally in upper fields but diminished at bilateral bases; known elevated R hemidiaphragm GI: Soft, nontender, non-distended  MS: 1+ bilateral LE edema to upper calves; No  deformity. Neuro:  Nonfocal  Psych: Normal affect   New pertinent results (labs, ECG, imaging, cardiac studies)    RHC 04/11/23:  1. Elevated right-sided filling pressure.  2. Normal PCWP.  3. Moderate pulmonary arterial hypertension.  PVR 4.11 WU.  With high cardiac output, also likely a component of high output pulmonary hypertension (in setting of cirrhosis).  4. High cardiac output.  5. No step-up in oxygen saturation.  6. Preserved PAPi.    Possible portopulmonary hypertension.  High cardiac output with cirrhosis may also contribute.  Needs some diuresis for RV failure and needs workup of pulmonary hypertension, would get V/Q scan while inpatient and make sure autoimmune serologies have been done.   Patient Profile     67 y.o. female notable PMH NASH cirrhosis followed by [facility], chronic thrombocytopenia, Sjogren's syndrome, AS s/p surgical bioprosthetic AVR in 2019, paroxysmal atrial fibrillation whom we are consulted on for edema/concern for heart failure   Assessment & Plan    Lower extremity edema Whitney [name] cirrhosis Chronic respiratory failure on home oxygen, Chronically elevated right hemidiaphragm Pulmonary hypertension -Her prior echo did show severely elevated PA pressures concerning for pulmonary hypertension. -reviewed RHC today,  discussed w/Whitney [name]. Wedge is normal, with elevated right sided pressures, high cardiac output. Concerning for portopulmonary hypertension. Will continue with diuresis for now. VQ scan and high res chest CT ordered to exclude potential lung etiologies as cause for PH. Autoimmune serologies also pending. -Prior pulmonary workup noted severe restrictive lung disease, probable obstructive disease without response to bronchodilators -normal wedge pressure excludes left sided heart failure as etiology -CO2 continues to rise, 44 today. Acetazolamide contraindicated in cirrhosis; discussed with Whitney [name] and pharmacy team, ok to use short term with diuresis  but long term use can affect ammonia metabolism, so contraindicated long term. If rises further will give one dose tomorrow -Added spironolactone 12/3 to see if K shift helps with bicarb -abdominal ultrasound with steatosis/cirrhosis, patent portal vein with normal flow -admission weight 110.8, today's weight 105.3 kg. I/O not well charted   Aortic stenosis status post bioprosthetic AVR -Mildly elevated gradient, thought to be due to patient prosthesis mismatch   Paroxysmal atrial fibrillation Thrombocytopenia -CHA2DS2/VAS Stroke Risk Points=6  -Currently in sinus rhythm -Platelets have been <50 this admission -see heme/onc summary in my note from 04/10/23 -restarting apixaban post cath. Has not had bleeding.  Will continue diuresis for now, may be able to transition to oral tomorrow. Will arrange for follow up with advanced heart failure to discuss options for pulmonary hypertension management based on results of testing ordered this admission.   Whitney Red, MD, PhD, [facility]  [facility]  [place]  [facility] & Vascular at [facility] [address], [address] [place], [place] [address] [phone]      Signed, Whitney Red, MD  04/11/2023, 7:28 PM

## 2023-04-11 NOTE — Interval H&P Note (Signed)
History and Physical Interval Note:  04/11/2023 3:06 PM  Whitney [name]  has presented today for surgery, with the diagnosis of heart failure.  The various methods of treatment have been discussed with the patient and family. After consideration of risks, benefits and other options for treatment, the patient has consented to  Procedure(s): RIGHT HEART CATH (N/A) as a surgical intervention.  The patient's history has been reviewed, patient examined, no change in status, stable for surgery.  I have reviewed the patient's chart and labs.  Questions were answered to the patient's satisfaction.     [facility] Ahlgren [employer]

## 2023-04-11 NOTE — Progress Notes (Signed)
PROGRESS NOTE    Whitney [name]  BJY:[id number] DOB: January 14, 1956 DOA: 04/07/2023 PCP: Daisy Floro, MD    Brief Narrative:  67 y.o. female with medical history significant of NASH cirrhosis, severe aortic stenosis status post AVR, type 2 diabetes diet-controlled, PAF (Eliquis), Sjogren syndrome, sleep apnea but no longer on CPAP as she uses 3 to 4 L nasal cannula due to recently diagnosed with COPD but is a non-smoker who presented for shortness of breath with worsening peripheral edema.  Cardiology planning RHC today   Assessment & Plan:  Principal Problem:   Anasarca Active Problems:   Cirrhosis (HCC)   Paroxysmal atrial fibrillation (HCC)   Sjogren's syndrome (HCC)   Hyperlipidemia   Severe aortic stenosis   S/P AVR   DM2 (diabetes mellitus, type 2) (HCC)   NASH (nonalcoholic steatohepatitis)   Thrombocytopenia (HCC)   Other neutropenia (HCC)     Bilateral lower extremity swelling Pulmonary edema, mild There is concerns of some volume overload in the setting of Nash cirrhosis.  For now plan is to continue diuresis, monitor urine output and electrolytes.   Aortic valve stenosis status post supportive valve replacement.  Pulmonary hypertension Echo performed 03/14/2023 showing LV function 65 to 70%, moderate LV concentric hypertrophy, moderately reduced RV systolic function, severely elevated pulmonary arterial systolic pressure with dilated left and right atria and prosthesis patient mismatch with mildly elevated gradients.  Cardiology team is currently following.  RHC today   NASH cirrhosis Follows with [facility].  Continue diuretics - Liver ultrasound shows hepatic steatosis   Chronic respiratory failure History of COPD, recent diagnosis Follows outpatient pulmonology.  Continue supportive care.  As needed bronchodilators.   Non-insulin-dependent type 2 diabetes Reports of diet control. Has been prescribed Ozempic likely for weight loss.  A1c is 5.8.   Paroxysmal  A-fib Nadolol and Eliquis.  IV as needed   Thrombocytopenia, neutropenia Per gi note has been evaluated by heme and these thought to be 2/2 cirrhosis   Sjogren syndrome She has switching to new rheumatologist here at home.  There is concern for pulmonary arterial hypertension based on previous echo.   Hyperlipidemia Continue home Zetia  HypoMg - Repletion     DVT prophylaxis: home apixaban Code Status: full Family Communication:    Level of care: Telemetry Cardiac Status is: Inpatient Remains inpatient appropriate because: severity of illness        Subjective: No complaints feeling okay Waiting for RHC today  Examination:  General exam: Appears calm and comfortable  Respiratory system: Clear to auscultation. Respiratory effort normal. Cardiovascular system: S1 & S2 heard, RRR. No JVD, murmurs, rubs, gallops or clicks. 2+ b/l LE pitting edema.  Gastrointestinal system: Abdomen is nondistended, soft and nontender. No organomegaly or masses felt. Normal bowel sounds heard. Central nervous system: Alert and oriented. No focal neurological deficits. Extremities: Symmetric 5 x 5 power. Skin: No rashes, lesions or ulcers Psychiatry: Judgement and insight appear normal. Mood & affect appropriate.                Diet Orders (From admission, onward)     Start     Ordered   04/11/23 0001  Diet NPO time specified Except for: Sips with Meds  Diet effective midnight       Comments: NPO for solid foods after midnight, may have clear liquids until 5am, then NPO (this would be for inpatients and outpatients)  Question:  Except for  Answer:  Sips with Meds   04/10/23 0931  Objective: Vitals:   04/11/23 0442 04/11/23 0746 04/11/23 0824 04/11/23 1105  BP: 135/77 129/72  132/84  Pulse: 60 63 64 67  Resp: 18 (!) 22 18 20   Temp: 98.1 F (36.7 C) 98.1 F (36.7 C)  97.8 F (36.6 C)  TempSrc: Oral Oral  Oral  SpO2: 95% 96%  98%  Weight: 105.3 kg      Height:        Intake/Output Summary (Last 24 hours) at 04/11/2023 1220 Last data filed at 04/10/2023 2230 Gross per 24 hour  Intake 537 ml  Output 600 ml  Net -63 ml   Filed Weights   04/09/23 0524 04/10/23 0458 04/11/23 0442  Weight: 108.9 kg 108.2 kg 105.3 kg    Scheduled Meds:  arformoterol  15 mcg Nebulization BID   And   umeclidinium bromide  1 puff Inhalation Daily   ezetimibe  10 mg Oral QHS   furosemide  40 mg Intravenous BID   gabapentin  100 mg Oral BID   And   gabapentin  300 mg Oral QHS   insulin aspart  0-15 Units Subcutaneous TID WC   insulin aspart  0-5 Units Subcutaneous QHS   nadolol  40 mg Oral QHS   sodium chloride flush  10 mL Intravenous Q12H   spironolactone  25 mg Oral Daily   Continuous Infusions:  magnesium sulfate bolus IVPB 2 g (04/11/23 1133)    Nutritional status     Body mass index is 39.86 kg/m.  Data Reviewed:   CBC: Recent Labs  Lab 04/07/23 1603 04/08/23 0522 04/09/23 0249 04/10/23 0247 04/11/23 0227  WBC 2.5* 2.3* 2.7* 2.7* 3.0*  NEUTROABS 0.8*  --   --   --   --   HGB 13.0 12.5 13.2 12.7 13.3  HCT 42.3 41.5 42.5 40.4 42.3  MCV 100.0 102.0* 99.3 99.0 96.8  PLT 48* 44* 45* 42* 44*   Basic Metabolic Panel: Recent Labs  Lab 04/07/23 1603 04/08/23 0522 04/09/23 0249 04/10/23 0247 04/11/23 0227  NA 143 143 143 140 140  K 4.4 4.3 3.9 4.1 4.2  CL 100 100 96* 93* 88*  CO2 34* 34* 41* 43* 44*  GLUCOSE 143* 106* 88 119* 96  BUN 14 14 11 18 19   CREATININE 0.78 0.90 0.79 0.88 0.83  CALCIUM 8.4* 8.5* 8.6* 8.4* 8.5*  MG  --   --   --  1.5* 1.7  PHOS  --   --   --  4.3  --    GFR: Estimated Creatinine Clearance: 75.6 mL/min (by C-G formula based on SCr of 0.83 mg/dL). Liver Function Tests: Recent Labs  Lab 04/07/23 1603 04/08/23 0522  AST 34 32  ALT 11 13  ALKPHOS 49 51  BILITOT 1.0 0.8  PROT 5.5* 5.7*  ALBUMIN 2.3* 2.2*   No results for input(s): "LIPASE", "AMYLASE" in the last 168 hours. No results for  input(s): "AMMONIA" in the last 168 hours. Coagulation Profile: Recent Labs  Lab 04/07/23 1603 04/11/23 0227  INR 1.9* 1.6*   Cardiac Enzymes: No results for input(s): "CKTOTAL", "CKMB", "CKMBINDEX", "TROPONINI" in the last 168 hours. BNP (last 3 results) Recent Labs    03/13/23 1428  PROBNP 996.0*   HbA1C: No results for input(s): "HGBA1C" in the last 72 hours. CBG: Recent Labs  Lab 04/10/23 1118 04/10/23 1615 04/10/23 2052 04/11/23 0601 04/11/23 1100  GLUCAP 107* 112* 143* 103* 89   Lipid Profile: No results for input(s): "CHOL", "HDL", "LDLCALC", "TRIG", "CHOLHDL", "LDLDIRECT"  in the last 72 hours. Thyroid Function Tests: No results for input(s): "TSH", "T4TOTAL", "FREET4", "T3FREE", "THYROIDAB" in the last 72 hours. Anemia Panel: No results for input(s): "VITAMINB12", "FOLATE", "FERRITIN", "TIBC", "IRON", "RETICCTPCT" in the last 72 hours. Sepsis Labs: No results for input(s): "PROCALCITON", "LATICACIDVEN" in the last 168 hours.  No results found for this or any previous visit (from the past 240 hour(s)).       Radiology Studies: No results found.         LOS: 4 days   Time spent= 35 mins    Miguel Rota, MD [facility]  If 7PM-7AM, please contact night-coverage  04/11/2023, 12:20 PM

## 2023-04-12 ENCOUNTER — Inpatient Hospital Stay (HOSPITAL_COMMUNITY): Payer: Medicare PPO

## 2023-04-12 ENCOUNTER — Encounter (HOSPITAL_COMMUNITY): Payer: Self-pay | Admitting: Cardiology

## 2023-04-12 DIAGNOSIS — R601 Generalized edema: Secondary | ICD-10-CM | POA: Diagnosis not present

## 2023-04-12 DIAGNOSIS — D696 Thrombocytopenia, unspecified: Secondary | ICD-10-CM | POA: Diagnosis not present

## 2023-04-12 DIAGNOSIS — Z952 Presence of prosthetic heart valve: Secondary | ICD-10-CM | POA: Diagnosis not present

## 2023-04-12 DIAGNOSIS — I272 Pulmonary hypertension, unspecified: Secondary | ICD-10-CM | POA: Diagnosis not present

## 2023-04-12 DIAGNOSIS — K7581 Nonalcoholic steatohepatitis (NASH): Secondary | ICD-10-CM | POA: Diagnosis not present

## 2023-04-12 LAB — ENA+DNA/DS+ANTICH+CENTRO+JO...
Anti JO-1: <0.2 AI (ref 0.0–0.9)
Centromere Ab Screen: <0.2 AI (ref 0.0–0.9)
Chromatin Ab SerPl-aCnc: 0.2 AI (ref 0.0–0.9)
ENA SM Ab Ser-aCnc: 0.2 AI (ref 0.0–0.9)
Ribonucleic Protein: <0.2 AI (ref 0.0–0.9)
SSA (Ro) (ENA) Antibody, IgG: >8 AI — ABNORMAL HIGH (ref 0.0–0.9)
SSB (La) (ENA) Antibody, IgG: >8 AI — ABNORMAL HIGH (ref 0.0–0.9)
Scleroderma (Scl-70) (ENA) Antibody, IgG: <0.2 AI (ref 0.0–0.9)
ds DNA Ab: <1 [IU]/mL (ref 0–9)

## 2023-04-12 LAB — ANA W/REFLEX IF POSITIVE: Anti Nuclear Antibody (ANA): POSITIVE — AB

## 2023-04-12 LAB — RHEUMATOID FACTOR: Rheumatoid fact SerPl-aCnc: 17.9 [IU]/mL — ABNORMAL HIGH (ref <14.0)

## 2023-04-12 LAB — CBC
HCT: 41.3 % (ref 36.0–46.0)
Hemoglobin: 12.5 g/dL (ref 12.0–15.0)
MCH: 29.6 pg (ref 26.0–34.0)
MCHC: 30.3 g/dL (ref 30.0–36.0)
MCV: 97.9 fL (ref 80.0–100.0)
Platelets: 43 10*3/uL — ABNORMAL LOW (ref 150–400)
RBC: 4.22 MIL/uL (ref 3.87–5.11)
RDW: 15.1 % (ref 11.5–15.5)
WBC: 2.7 10*3/uL — ABNORMAL LOW (ref 4.0–10.5)
nRBC: 0 % (ref 0.0–0.2)

## 2023-04-12 LAB — BASIC METABOLIC PANEL
Anion gap: 8 (ref 5–15)
BUN: 23 mg/dL (ref 8–23)
CO2: 40 mmol/L — ABNORMAL HIGH (ref 22–32)
Calcium: 8.4 mg/dL — ABNORMAL LOW (ref 8.9–10.3)
Chloride: 91 mmol/L — ABNORMAL LOW (ref 98–111)
Creatinine, Ser: 0.79 mg/dL (ref 0.44–1.00)
GFR, Estimated: >60 mL/min (ref >60)
Glucose, Bld: 107 mg/dL — ABNORMAL HIGH (ref 70–99)
Potassium: 4.8 mmol/L (ref 3.5–5.1)
Sodium: 139 mmol/L (ref 135–145)

## 2023-04-12 LAB — GLUCOSE, CAPILLARY
Glucose-Capillary: 93 mg/dL (ref 70–99)
Glucose-Capillary: 121 mg/dL — ABNORMAL HIGH (ref 70–99)
Glucose-Capillary: 92 mg/dL (ref 70–99)
Glucose-Capillary: 139 mg/dL — ABNORMAL HIGH (ref 70–99)

## 2023-04-12 LAB — MAGNESIUM: Magnesium: 2.2 mg/dL (ref 1.7–2.4)

## 2023-04-12 LAB — ANTI-SCLERODERMA ANTIBODY: Scleroderma (Scl-70) (ENA) Antibody, IgG: <0.2 AI (ref 0.0–0.9)

## 2023-04-12 LAB — CENTROMERE ANTIBODIES: Centromere Ab Screen: <0.2 AI (ref 0.0–0.9)

## 2023-04-12 MED ORDER — TECHNETIUM TO 99M ALBUMIN AGGREGATED
4.2000 | Freq: Once | INTRAVENOUS | Status: AC | PRN
Start: 2023-04-12 — End: 2023-04-12
  Administered 2023-04-12: 4.2 via INTRAVENOUS

## 2023-04-12 NOTE — Progress Notes (Signed)
Physical Therapy Treatment Patient Details Name: Whitney [name] MRN: [id number] DOB: 21-Feb-1956 Today's Date: 04/12/2023   History of Present Illness 67 y.o. female presents to [facility] hospital on 04/07/2023 with SOB and worsening peripgeral edema. PMH includes NASH cirrhosis, DM2, PAF on eliquis, sjogren's syndrome, severe AS s/p AVR, OSA on CPAP.    PT Comments  Pt received sitting EOB and agreeable to session. Pt reporting increased difficulty standing from low surfaces. Pt able to stand from low EOB without UE support x5 and is encouraged to perform for BLE strengthening. Pt able to tolerate gait and stair trials with up to CGA for safety, but no LOB. Pt reports that her balance is not back to baseline and notices R drifting occasionally. Discussed safe navigation of home set up and reducing fall risk. Pt noted to attempt stepping over the O2 line in room and was educated on avoiding this for safety and reducing fall risk. Pt continues to benefit from PT services to progress toward functional mobility goals.     If plan is discharge home, recommend the following: A little help with bathing/dressing/bathroom;Assistance with cooking/housework;Assist for transportation;Help with stairs or ramp for entrance   Can travel by private vehicle        Equipment Recommendations  None recommended by PT    Recommendations for Other Services       Precautions / Restrictions Precautions Precautions: Fall Precaution Comments: monitor SpO2, 3L [place] chronically Restrictions Weight Bearing Restrictions: No     Mobility  Bed Mobility               General bed mobility comments: Pt sitting EOB at beginning and end of session    Transfers Overall transfer level: Needs assistance Equipment used: None Transfers: Sit to/from Stand Sit to Stand: Supervision                Ambulation/Gait Ambulation/Gait assistance: Supervision Gait Distance (Feet): 200 Feet Assistive device: None Gait  Pattern/deviations: Step-through pattern, Drifts right/left       General Gait Details: Intermittent drifting to the R with pt acknowledging and able to correct without assist. Slight instability without AD, but no LOB   Stairs Stairs: Yes Stairs assistance: Contact guard assist Stair Management: One rail Left, Step to pattern Number of Stairs: 3 General stair comments: Cues for sequencing and CGA for safety       Balance Overall balance assessment: Needs assistance Sitting-balance support: No upper extremity supported, Feet supported Sitting balance-Leahy Scale: Normal     Standing balance support: No upper extremity supported, During functional activity Standing balance-Leahy Scale: Good Standing balance comment: without AD                            Cognition Arousal: Alert Behavior During Therapy: WFL for tasks assessed/performed Overall Cognitive Status: Within Functional Limits for tasks assessed                                          Exercises Other Exercises Other Exercises: x5 serial STS without UE support    General Comments        Pertinent Vitals/Pain Pain Assessment Pain Assessment: No/denies pain     PT Goals (current goals can now be found in the care plan section) Acute Rehab PT Goals Patient Stated Goal: to return to prior level of function,  improve activity tolerance PT Goal Formulation: With patient/family Time For Goal Achievement: 04/22/23 Progress towards PT goals: Progressing toward goals    Frequency    Min 1X/week       AM-PAC PT "6 Clicks" Mobility   Outcome Measure  Help needed turning from your back to your side while in a flat bed without using bedrails?: None Help needed moving from lying on your back to sitting on the side of a flat bed without using bedrails?: None Help needed moving to and from a bed to a chair (including a wheelchair)?: A Little Help needed standing up from a chair  using your arms (e.g., wheelchair or bedside chair)?: A Little Help needed to walk in hospital room?: A Little Help needed climbing 3-5 steps with a railing? : A Little 6 Click Score: 20    End of Session Equipment Utilized During Treatment: Oxygen Activity Tolerance: Patient tolerated treatment well Patient left: in bed;with call bell/phone within reach Nurse Communication: Mobility status PT Visit Diagnosis: Other abnormalities of gait and mobility (R26.89)     Time: 1610-9604 PT Time Calculation (min) (ACUTE ONLY): 26 min  Charges:    $Gait Training: 8-22 mins $Therapeutic Activity: 8-22 mins PT General Charges $$ ACUTE PT VISIT: 1 Visit                     Johny Shock, PTA Acute Rehabilitation Services Secure Chat Preferred  [phone]    Johny Shock 04/12/2023, 4:23 PM

## 2023-04-12 NOTE — Progress Notes (Signed)
PROGRESS NOTE    Whitney [name]  WUJ:[id number] DOB: October 19, 1955 DOA: 04/07/2023 PCP: Daisy Floro, MD    Brief Narrative:  67 y.o. female with medical history significant of NASH cirrhosis, severe aortic stenosis status post AVR, type 2 diabetes diet-controlled, PAF (Eliquis), Sjogren syndrome, sleep apnea but no longer on CPAP as she uses 3 to 4 L nasal cannula due to recently diagnosed with COPD but is a non-smoker who presented for shortness of breath with worsening peripheral edema.  RHC was performed on 12/5 which showed elevated right-sided pressure, normal wedge pressure with high cardiac output.  There was concerns of portal pulmonary hypertension.  Recommended diuresis, VQ scan and high-resolution CT.   Assessment & Plan:  Principal Problem:   Anasarca Active Problems:   Cirrhosis (HCC)   Paroxysmal atrial fibrillation (HCC)   Sjogren's syndrome (HCC)   Hyperlipidemia   Severe aortic stenosis   S/P AVR   DM2 (diabetes mellitus, type 2) (HCC)   NASH (nonalcoholic steatohepatitis)   Thrombocytopenia (HCC)   Other neutropenia (HCC)     Bilateral lower extremity swelling Pulmonary edema, mild There is concerns of some volume overload in the setting of Nash cirrhosis.  For now plan is to continue diuresis, monitor urine output and electrolytes.  Short-term use of Diamox due to rising bicarb prn   Aortic valve stenosis status post supportive valve replacement.  Pulmonary hypertension Echo performed 03/14/2023 showing LV function 65 to 70%, moderate LV concentric hypertrophy, moderately reduced RV systolic function, severely elevated pulmonary arterial systolic pressure with dilated left and right atria and prosthesis patient mismatch with mildly elevated gradients.  RHC showing concerns of portal pulmonary HTN with elevated right-sided heart pressures.  Continue diuresis, VQ scan and high-resolution CT-pending results.   NASH cirrhosis Follows with [facility].  Continue  diuretics - Liver ultrasound shows hepatic steatosis   Chronic respiratory failure History of COPD, recent diagnosis Follows outpatient pulmonology.  Continue supportive care.  As needed bronchodilators.   Non-insulin-dependent type 2 diabetes Reports of diet control. Has been prescribed Ozempic likely for weight loss.  A1c is 5.8.   Paroxysmal A-fib Nadolol and Eliquis.  IV as needed   Thrombocytopenia, neutropenia Per gi note has been evaluated by heme and these thought to be 2/2 cirrhosis   Sjogren syndrome She has switching to new rheumatologist here at home.  There is concern for pulmonary arterial hypertension based on previous echo.   Hyperlipidemia Continue home Zetia  HypoMg - Repletion     DVT prophylaxis: home apixaban Code Status: full Family Communication:    Level of care: Telemetry Cardiac Status is: Inpatient Remains inpatient appropriate because: severity of illness.  Discharge once cleared by cardiology        Subjective: No new complaints during my evaluation. Examination:  General exam: Appears calm and comfortable  Respiratory system: Clear to auscultation. Respiratory effort normal. Cardiovascular system: S1 & S2 heard, RRR. No JVD, murmurs, rubs, gallops or clicks. 2+ b/l LE pitting edema.  Gastrointestinal system: Abdomen is nondistended, soft and nontender. No organomegaly or masses felt. Normal bowel sounds heard. Central nervous system: Alert and oriented. No focal neurological deficits. Extremities: Symmetric 5 x 5 power. Skin: No rashes, lesions or ulcers Psychiatry: Judgement and insight appear normal. Mood & affect appropriate.                Diet Orders (From admission, onward)     Start     Ordered   04/11/23 1600  Diet  Heart Fluid consistency: Thin  Diet effective now       Question:  Fluid consistency:  Answer:  Thin   04/11/23 1559            Objective: Vitals:   04/12/23 0648 04/12/23 0734 04/12/23  0812 04/12/23 1050  BP:      Pulse:      Resp:  17 18 18   Temp:  98.4 F (36.9 C)  98.5 F (36.9 C)  TempSrc:  Oral  Oral  SpO2:  96%    Weight: 104.6 kg     Height:        Intake/Output Summary (Last 24 hours) at 04/12/2023 1128 Last data filed at 04/12/2023 0824 Gross per 24 hour  Intake 750 ml  Output --  Net 750 ml   Filed Weights   04/10/23 0458 04/11/23 0442 04/12/23 0648  Weight: 108.2 kg 105.3 kg 104.6 kg    Scheduled Meds:  apixaban  5 mg Oral BID   arformoterol  15 mcg Nebulization BID   And   umeclidinium bromide  1 puff Inhalation Daily   ezetimibe  10 mg Oral QHS   furosemide  40 mg Intravenous BID   gabapentin  100 mg Oral BID   And   gabapentin  300 mg Oral QHS   insulin aspart  0-15 Units Subcutaneous TID WC   insulin aspart  0-5 Units Subcutaneous QHS   nadolol  40 mg Oral QHS   spironolactone  25 mg Oral Daily   Continuous Infusions:  Nutritional status     Body mass index is 39.58 kg/m.  Data Reviewed:   CBC: Recent Labs  Lab 04/07/23 1603 04/08/23 0522 04/09/23 0249 04/10/23 0247 04/11/23 0227 04/11/23 1520 04/11/23 1524 04/12/23 0249  WBC 2.5* 2.3* 2.7* 2.7* 3.0*  --   --  2.7*  NEUTROABS 0.8*  --   --   --   --   --   --   --   HGB 13.0 12.5 13.2 12.7 13.3 13.9 13.9  13.9 12.5  HCT 42.3 41.5 42.5 40.4 42.3 41.0 41.0  41.0 41.3  MCV 100.0 102.0* 99.3 99.0 96.8  --   --  97.9  PLT 48* 44* 45* 42* 44*  --   --  43*   Basic Metabolic Panel: Recent Labs  Lab 04/08/23 0522 04/09/23 0249 04/10/23 0247 04/11/23 0227 04/11/23 1520 04/11/23 1524 04/12/23 0249  NA 143 143 140 140 141 140  140 139  K 4.3 3.9 4.1 4.2 3.9 3.9  3.9 4.8  CL 100 96* 93* 88*  --   --  91*  CO2 34* 41* 43* 44*  --   --  40*  GLUCOSE 106* 88 119* 96  --   --  107*  BUN 14 11 18 19   --   --  23  CREATININE 0.90 0.79 0.88 0.83  --   --  0.79  CALCIUM 8.5* 8.6* 8.4* 8.5*  --   --  8.4*  MG  --   --  1.5* 1.7  --   --  2.2  PHOS  --   --  4.3  --    --   --   --    GFR: Estimated Creatinine Clearance: 78.3 mL/min (by C-G formula based on SCr of 0.79 mg/dL). Liver Function Tests: Recent Labs  Lab 04/07/23 1603 04/08/23 0522  AST 34 32  ALT 11 13  ALKPHOS 49 51  BILITOT 1.0 0.8  PROT  5.5* 5.7*  ALBUMIN 2.3* 2.2*   No results for input(s): "LIPASE", "AMYLASE" in the last 168 hours. No results for input(s): "AMMONIA" in the last 168 hours. Coagulation Profile: Recent Labs  Lab 04/07/23 1603 04/11/23 0227  INR 1.9* 1.6*   Cardiac Enzymes: No results for input(s): "CKTOTAL", "CKMB", "CKMBINDEX", "TROPONINI" in the last 168 hours. BNP (last 3 results) Recent Labs    03/13/23 1428  PROBNP 996.0*   HbA1C: No results for input(s): "HGBA1C" in the last 72 hours. CBG: Recent Labs  Lab 04/11/23 1100 04/11/23 1626 04/11/23 2105 04/12/23 0558 04/12/23 1053  GLUCAP 89 88 184* 93 121*   Lipid Profile: No results for input(s): "CHOL", "HDL", "LDLCALC", "TRIG", "CHOLHDL", "LDLDIRECT" in the last 72 hours. Thyroid Function Tests: No results for input(s): "TSH", "T4TOTAL", "FREET4", "T3FREE", "THYROIDAB" in the last 72 hours. Anemia Panel: No results for input(s): "VITAMINB12", "FOLATE", "FERRITIN", "TIBC", "IRON", "RETICCTPCT" in the last 72 hours. Sepsis Labs: No results for input(s): "PROCALCITON", "LATICACIDVEN" in the last 168 hours.  No results found for this or any previous visit (from the past 240 hour(s)).       Radiology Studies: DG Chest Port 1 View  Result Date: 04/12/2023 CLINICAL DATA:  Dyspnea. EXAM: PORTABLE CHEST 1 VIEW COMPARISON:  04/07/2023 FINDINGS: Low volume film with stable asymmetric elevation right hemidiaphragm. The cardio pericardial silhouette is enlarged. Vascular congestion with diffuse interstitial opacity suggests edema. Telemetry leads overlie the chest. IMPRESSION: Low volume film with vascular congestion and diffuse interstitial opacity suggesting edema. Electronically Signed   By:  Kennith Center M.D.   On: 04/12/2023 08:22   CARDIAC CATHETERIZATION  Result Date: 04/11/2023 1. Elevated right-sided filling pressure. 2. Normal PCWP. 3. Moderate pulmonary arterial hypertension.  PVR 4.11 WU.  With high cardiac output, also likely a component of high output pulmonary hypertension (in setting of cirrhosis). 4. High cardiac output. 5. No step-up in oxygen saturation. 6. Preserved PAPi. Possible portopulmonary hypertension.  High cardiac output with cirrhosis may also contribute.  Needs some diuresis for RV failure and needs workup of pulmonary hypertension, would get V/Q scan while inpatient and make sure autoimmune serologies have been done.           LOS: 5 days   Time spent= 35 mins    Miguel Rota, MD [facility]  If 7PM-7AM, please contact night-coverage  04/12/2023, 11:28 AM

## 2023-04-12 NOTE — Progress Notes (Signed)
Rounding Note    Patient Name: Whitney [name] Date of Encounter: 04/12/2023  [facility] Cardiologist: Charlton Haws, MD   Subjective   Had multiple imaging studies performed this AM as part of her pulmonary hypertension workup. She feels not yet at baseline but improving.   Inpatient Medications    Scheduled Meds:  apixaban  5 mg Oral BID   arformoterol  15 mcg Nebulization BID   And   umeclidinium bromide  1 puff Inhalation Daily   ezetimibe  10 mg Oral QHS   furosemide  40 mg Intravenous BID   gabapentin  100 mg Oral BID   And   gabapentin  300 mg Oral QHS   insulin aspart  0-15 Units Subcutaneous TID WC   insulin aspart  0-5 Units Subcutaneous QHS   nadolol  40 mg Oral QHS   spironolactone  25 mg Oral Daily   Continuous Infusions:   PRN Meds: acetaminophen, guaiFENesin, ipratropium-albuterol, ondansetron **OR** ondansetron (ZOFRAN) IV, senna-docusate, traZODone   Vital Signs    Vitals:   04/12/23 0734 04/12/23 0812 04/12/23 1050 04/12/23 1606  BP:    (!) 141/89  Pulse:      Resp: 17 18 18 19   Temp: 98.4 F (36.9 C)  98.5 F (36.9 C) (!) 97.5 F (36.4 C)  TempSrc: Oral  Oral   SpO2: 96%     Weight:      Height:        Intake/Output Summary (Last 24 hours) at 04/12/2023 1804 Last data filed at 04/12/2023 1759 Gross per 24 hour  Intake 1047 ml  Output 1110 ml  Net -63 ml      04/12/2023    6:48 AM 04/11/2023    4:42 AM 04/10/2023    4:58 AM  Last 3 Weights  Weight (lbs) 230 lb 9.6 oz 232 lb 3.2 oz 238 lb 8.6 oz  Weight (kg) 104.6 kg 105.325 kg 108.2 kg      Telemetry    SR - Personally Reviewed  Physical Exam   GEN: No acute distress.  [place] in place Neck: no JVD at 90 degrees Cardiac: RRR, no rubs, or gallops. 2/6 systolic murmur. Eccymosis at R brachial cath site without swelling or warmth Respiratory: Clear to auscultation bilaterally in upper fields but diminished at bilateral bases; known elevated R hemidiaphragm GI: Soft,  nontender, non-distended  MS: trace bilateral LE edema, compression stockings in place; No deformity. Neuro:  Nonfocal  Psych: Normal affect   New pertinent results (labs, ECG, imaging, cardiac studies)    RHC 04/11/23:  1. Elevated right-sided filling pressure.  2. Normal PCWP.  3. Moderate pulmonary arterial hypertension.  PVR 4.11 WU.  With high cardiac output, also likely a component of high output pulmonary hypertension (in setting of cirrhosis).  4. High cardiac output.  5. No step-up in oxygen saturation.  6. Preserved PAPi.    Possible portopulmonary hypertension.  High cardiac output with cirrhosis may also contribute.  Needs some diuresis for RV failure and needs workup of pulmonary hypertension, would get V/Q scan while inpatient and make sure autoimmune serologies have been done.   Patient Profile     67 y.o. female notable PMH NASH cirrhosis followed by [facility], chronic thrombocytopenia, Sjogren's syndrome, AS s/p surgical bioprosthetic AVR in 2019, paroxysmal atrial fibrillation whom we are consulted on for edema/concern for heart failure   Assessment & Plan    Lower extremity edema [name] cirrhosis Chronic respiratory failure on home oxygen, Chronically  elevated right hemidiaphragm Pulmonary hypertension -Her prior echo did show severely elevated PA pressures concerning for pulmonary hypertension. -reviewed RHC today, discussed w/Dr Shirlee Latch. Wedge is normal, with elevated right sided pressures, high cardiac output. Concerning for portopulmonary hypertension. Will continue with diuresis for now. VQ scan and high res chest CT ordered to exclude potential lung etiologies as cause for PH. Autoimmune serologies also pending. -Prior pulmonary workup noted severe restrictive lung disease, probable obstructive disease without response to bronchodilators -normal wedge pressure excludes left sided heart failure as etiology -CO2 slightly improved today, from 44 to 40. Acetazolamide  contraindicated in cirrhosis; discussed with Dr Shirlee Latch and pharmacy team, ok to use short term with diuresis but long term use can affect ammonia metabolism, so contraindicated long term.  -Added spironolactone 12/3 to see if K shift helps with bicarb -abdominal ultrasound with steatosis/cirrhosis, patent portal vein with normal flow -admission weight 110.8, today's weight 104.6 kg. I/O not well charted -Given her continued improvement, continued with IV diuresis today.  May be able to change to oral tomorrow.   Aortic stenosis status post bioprosthetic AVR -Mildly elevated gradient, thought to be due to patient prosthesis mismatch   Paroxysmal atrial fibrillation Thrombocytopenia -CHA2DS2/VAS Stroke Risk Points=6  -Currently in sinus rhythm -Platelets have been <50 this admission -see heme/onc summary in my note from 04/10/23 -restarting apixaban post cath. Has not had bleeding.  Will continue diuresis for now, may be able to transition to oral tomorrow. Will need follow up with advanced heart failure to discuss options for pulmonary hypertension management based on results of testing ordered this admission.   Jodelle Red, MD, PhD, [facility]  [facility]  [place]  [facility] at [facility] at [facility] [address], [address] [place], [place] [address] [phone]      Signed, Jodelle Red, MD  04/12/2023, 6:04 PM

## 2023-04-13 ENCOUNTER — Inpatient Hospital Stay (HOSPITAL_COMMUNITY): Payer: Medicare PPO

## 2023-04-13 DIAGNOSIS — J9611 Chronic respiratory failure with hypoxia: Secondary | ICD-10-CM | POA: Diagnosis not present

## 2023-04-13 DIAGNOSIS — R601 Generalized edema: Secondary | ICD-10-CM | POA: Diagnosis not present

## 2023-04-13 DIAGNOSIS — R600 Localized edema: Secondary | ICD-10-CM | POA: Diagnosis not present

## 2023-04-13 DIAGNOSIS — Z952 Presence of prosthetic heart valve: Secondary | ICD-10-CM | POA: Diagnosis not present

## 2023-04-13 DIAGNOSIS — K7581 Nonalcoholic steatohepatitis (NASH): Secondary | ICD-10-CM | POA: Diagnosis not present

## 2023-04-13 LAB — GLUCOSE, CAPILLARY
Glucose-Capillary: 91 mg/dL (ref 70–99)
Glucose-Capillary: 118 mg/dL — ABNORMAL HIGH (ref 70–99)
Glucose-Capillary: 136 mg/dL — ABNORMAL HIGH (ref 70–99)
Glucose-Capillary: 111 mg/dL — ABNORMAL HIGH (ref 70–99)

## 2023-04-13 LAB — CBC
HCT: 41.9 % (ref 36.0–46.0)
Hemoglobin: 13.1 g/dL (ref 12.0–15.0)
MCH: 30.8 pg (ref 26.0–34.0)
MCHC: 31.3 g/dL (ref 30.0–36.0)
MCV: 98.6 fL (ref 80.0–100.0)
Platelets: 45 10*3/uL — ABNORMAL LOW (ref 150–400)
RBC: 4.25 MIL/uL (ref 3.87–5.11)
RDW: 15 % (ref 11.5–15.5)
WBC: 2.9 10*3/uL — ABNORMAL LOW (ref 4.0–10.5)
nRBC: 0 % (ref 0.0–0.2)

## 2023-04-13 LAB — BASIC METABOLIC PANEL
Anion gap: 8 (ref 5–15)
BUN: 24 mg/dL — ABNORMAL HIGH (ref 8–23)
CO2: 43 mmol/L — ABNORMAL HIGH (ref 22–32)
Calcium: 8.6 mg/dL — ABNORMAL LOW (ref 8.9–10.3)
Chloride: 89 mmol/L — ABNORMAL LOW (ref 98–111)
Creatinine, Ser: 0.8 mg/dL (ref 0.44–1.00)
GFR, Estimated: >60 mL/min (ref >60)
Glucose, Bld: 99 mg/dL (ref 70–99)
Potassium: 4 mmol/L (ref 3.5–5.1)
Sodium: 140 mmol/L (ref 135–145)

## 2023-04-13 LAB — MAGNESIUM: Magnesium: 2.1 mg/dL (ref 1.7–2.4)

## 2023-04-13 LAB — LIPOPROTEIN A (LPA): Lipoprotein (a): 82.1 nmol/L — ABNORMAL HIGH (ref <75.0)

## 2023-04-13 MED ORDER — IOHEXOL 350 MG/ML SOLN
75.0000 mL | Freq: Once | INTRAVENOUS | Status: AC | PRN
  Administered 2023-04-13: 75 mL via INTRAVENOUS

## 2023-04-13 MED ORDER — FUROSEMIDE 40 MG PO TABS
40.0000 mg | ORAL_TABLET | Freq: Every day | ORAL | Status: DC
  Administered 2023-04-14: 40 mg via ORAL
  Filled 2023-04-13: qty 1

## 2023-04-13 NOTE — Progress Notes (Signed)
Rounding Note    Patient Name: Whitney [name] Date of Encounter: 04/13/2023  [facility] Cardiologist: Charlton Haws, MD   Subjective   Discussed her chronic conditions and VQ scan results   Inpatient Medications    Scheduled Meds:  apixaban  5 mg Oral BID   arformoterol  15 mcg Nebulization BID   And   umeclidinium bromide  1 puff Inhalation Daily   ezetimibe  10 mg Oral QHS   furosemide  40 mg Intravenous BID   gabapentin  100 mg Oral BID   And   gabapentin  300 mg Oral QHS   insulin aspart  0-15 Units Subcutaneous TID WC   insulin aspart  0-5 Units Subcutaneous QHS   nadolol  40 mg Oral QHS   spironolactone  25 mg Oral Daily   Continuous Infusions:   PRN Meds: acetaminophen, guaiFENesin, ipratropium-albuterol, ondansetron **OR** ondansetron (ZOFRAN) IV, senna-docusate, traZODone   Vital Signs    Vitals:   04/12/23 2023 04/13/23 0039 04/13/23 0519 04/13/23 0700  BP: 113/72 124/73 112/70 130/68  Pulse: 67 65 60 64  Resp: 19 18 17 18   Temp: (!) 97.5 F (36.4 C) 98 F (36.7 C) 98 F (36.7 C) 98.6 F (37 C)  TempSrc: Oral Oral Oral Oral  SpO2:  96% 94% 97%  Weight:   104.2 kg   Height:        Intake/Output Summary (Last 24 hours) at 04/13/2023 0930 Last data filed at 04/13/2023 0041 Gross per 24 hour  Intake 537 ml  Output 1510 ml  Net -973 ml      04/13/2023    5:19 AM 04/12/2023    6:48 AM 04/11/2023    4:42 AM  Last 3 Weights  Weight (lbs) 229 lb 11.5 oz 230 lb 9.6 oz 232 lb 3.2 oz  Weight (kg) 104.2 kg 104.6 kg 105.325 kg      Telemetry    SR - Personally Reviewed  Physical Exam   GEN: No acute distress.  [place] in place Neck: mild JVD (TR) Cardiac: RRR, no rubs, or gallops. Blowing 3/6  systolic murmur.  Respiratory: Clear to auscultation bilaterally in upper fields but diminished at bilateral bases; known elevated R hemidiaphragm GI: Soft, nontender, non-distended  MS: no significant LE edema, compression stockings in place; No  deformity. Neuro:  Nonfocal  Psych: Normal affect   New pertinent results (labs, ECG, imaging, cardiac studies)    RHC 04/11/23:  1. Elevated right-sided filling pressure.  2. Normal PCWP.  3. Moderate pulmonary arterial hypertension.  PVR 4.11 WU.  With high cardiac output, also likely a component of high output pulmonary hypertension (in setting of cirrhosis).  4. High cardiac output.  5. No step-up in oxygen saturation.  6. Preserved PAPi.    Possible portopulmonary hypertension.  High cardiac output with cirrhosis may also contribute.  Needs some diuresis for RV failure and needs workup of pulmonary hypertension, would get V/Q scan while inpatient and make sure autoimmune serologies have been done.   Patient Profile     67 y.o. female notable PMH NASH cirrhosis followed by [facility], chronic thrombocytopenia, Sjogren's syndrome, AS s/p surgical bioprosthetic AVR in 2019, paroxysmal atrial fibrillation whom we are consulted on for edema/concern for heart failure   Assessment & Plan    Lower extremity edema [name] cirrhosis Chronic respiratory failure on home oxygen, Chronically elevated right hemidiaphragm Pulmonary hypertension -Her prior echo did show severely elevated PA pressures concerning for pulmonary hypertension. -reviewed RHC  today, discussed w/Dr Shirlee Latch. Wedge is normal, with elevated right sided pressures, high cardiac output. Concerning for portopulmonary hypertension. Will continue with diuresis for now. Autoimmune serologies also pending. -Prior pulmonary workup noted severe restrictive lung disease, probable obstructive disease without response to bronchodilators - VQ scan showed possible CTEPH. Will get a CTA chest to differentiate to ensure no acute PE -normal wedge pressure excludes left sided heart failure as etiology -CO2 elevated 43; changing to oral lasix, this should help  Acetazolamide contraindicated in cirrhosis; discussed with Dr Shirlee Latch and pharmacy team, ok to  use short term with diuresis but long term use can affect ammonia metabolism, so contraindicated long term.  -Added spironolactone 12/3 to see if K shift helps with bicarb -abdominal ultrasound with steatosis/cirrhosis, patent portal vein with normal flow -weight 244-> 229 -change to oral diuretic today   Aortic stenosis status post bioprosthetic AVR -Mildly elevated gradient, thought to be due to patient prosthesis mismatch   Paroxysmal atrial fibrillation Thrombocytopenia- in the setting of cirrhosis -CHA2DS2/VAS Stroke Risk Points=6  -Currently in sinus rhythm -Platelets have been <50 this admission - has been evaluated by heme, with steady platelets and low bleeding risk, can continue AC; plts 40s -restarting apixaban post cath. Has not had bleeding.  Will need follow up with advanced heart failure to discuss options for pulmonary hypertension management based on results of testing ordered this admission. She has follow up scheduled 12/17  Otherwise, there are no anticipated changes from cardiac perspective.  If continues to trend in a good direction likely can discharge over the weekend   Time Spent Directly with Patient:  I have spent a total of 35 minutes with the patient reviewing hospital notes, telemetry, EKGs, labs and examining the patient as well as establishing an assessment and plan that was discussed personally with the patient.        Signed, Maisie Fus, MD  04/13/2023, 9:30 AM

## 2023-04-13 NOTE — Progress Notes (Signed)
PROGRESS NOTE    Whitney [name]  VWU:[id number] DOB: Sep 29, 1955 DOA: 04/07/2023 PCP: Daisy Floro, MD    Brief Narrative:  67 y.o. female with medical history significant of NASH cirrhosis, severe aortic stenosis status post AVR, type 2 diabetes diet-controlled, PAF (Eliquis), Sjogren syndrome, sleep apnea but no longer on CPAP as she uses 3 to 4 L nasal cannula due to recently diagnosed with COPD but is a non-smoker who presented for shortness of breath with worsening peripheral edema.  RHC was performed on 12/5 which showed elevated right-sided pressure, normal wedge pressure with high cardiac output.  There was concerns of portal pulmonary hypertension.  Recommended diuresis, VQ scan and high-resolution CT.   Assessment & Plan:  Principal Problem:   Anasarca Active Problems:   Cirrhosis (HCC)   Paroxysmal atrial fibrillation (HCC)   Sjogren's syndrome (HCC)   Hyperlipidemia   Severe aortic stenosis   S/P AVR   DM2 (diabetes mellitus, type 2) (HCC)   NASH (nonalcoholic steatohepatitis)   Thrombocytopenia (HCC)   Other neutropenia (HCC)     Bilateral lower extremity swelling Pulmonary edema, mild There is concerns of some volume overload in the setting of Nash cirrhosis.  Diuresis per cardiology team.   Aortic valve stenosis status post supportive valve replacement.  Pulmonary hypertension Echo performed 03/14/2023 showing LV function 65 to 70%, moderate LV concentric hypertrophy, moderately reduced RV systolic function, severely elevated pulmonary arterial systolic pressure with dilated left and right atria and prosthesis patient mismatch with mildly elevated gradients.   RHC showing concerns of portal pulmonary HTN with elevated right-sided heart pressures.   VQ scan overall is inconclusive as differential is broad including chronic embolic disease versus atelectasis versus pneumonia.  CTA chest ordered  CT high-resolution shows chronic bronchial scarring, atelectasis,  cirrhosis, left upper quadrant varices.   Outpatient CHF clinic follow-up.   NASH cirrhosis Follows with [facility].  Continue diuretics - Liver ultrasound shows hepatic steatosis. Had Hx of Small EV on EGD 2021, due to repeat EGD per outptn [facility] records and she should follow-up with their service outpatient.   Chronic respiratory failure History of COPD, recent diagnosis Follows outpatient pulmonology.  Continue supportive care.  As needed bronchodilators.   Non-insulin-dependent type 2 diabetes Reports of diet control. Has been prescribed Ozempic likely for weight loss.  A1c is 5.8.   Paroxysmal A-fib Nadolol and Eliquis.  IV as needed   Thrombocytopenia, neutropenia Per gi note has been evaluated by heme and these thought to be 2/2 cirrhosis   Sjogren syndrome She has switching to new rheumatologist here at home.  There is concern for pulmonary arterial hypertension based on previous echo.   Hyperlipidemia Continue home Zetia  HypoMg - Repletion     DVT prophylaxis: home apixaban Code Status: full Family Communication: Daughter on the phone   Level of care: Telemetry Cardiac Status is: Inpatient Remains inpatient appropriate because: Hopefully DC resume once cleared by cardiology  Subjective: Feeling okay no complaints.   Examination:  General exam: Appears calm and comfortable  Respiratory system: Clear to auscultation. Respiratory effort normal. Cardiovascular system: S1 & S2 heard, RRR. No JVD, murmurs, rubs, gallops or clicks. 1+ b/l LE pitting edema.  Gastrointestinal system: Abdomen is nondistended, soft and nontender. No organomegaly or masses felt. Normal bowel sounds heard. Central nervous system: Alert and oriented. No focal neurological deficits. Extremities: Symmetric 5 x 5 power. Skin: No rashes, lesions or ulcers Psychiatry: Judgement and insight appear normal. Mood & affect appropriate.  Diet Orders (From admission,  onward)     Start     Ordered   04/11/23 1600  Diet Heart Fluid consistency: Thin  Diet effective now       Question:  Fluid consistency:  Answer:  Thin   04/11/23 1559            Objective: Vitals:   04/12/23 2023 04/13/23 0039 04/13/23 0519 04/13/23 0700  BP: 113/72 124/73 112/70 130/68  Pulse: 67 65 60 64  Resp: 19 18 17 18   Temp: (!) 97.5 F (36.4 C) 98 F (36.7 C) 98 F (36.7 C) 98.6 F (37 C)  TempSrc: Oral Oral Oral Oral  SpO2:  96% 94% 97%  Weight:   104.2 kg   Height:        Intake/Output Summary (Last 24 hours) at 04/13/2023 1113 Last data filed at 04/13/2023 0900 Gross per 24 hour  Intake 777 ml  Output 1510 ml  Net -733 ml   Filed Weights   04/11/23 0442 04/12/23 0648 04/13/23 0519  Weight: 105.3 kg 104.6 kg 104.2 kg    Scheduled Meds:  apixaban  5 mg Oral BID   arformoterol  15 mcg Nebulization BID   And   umeclidinium bromide  1 puff Inhalation Daily   ezetimibe  10 mg Oral QHS   furosemide  40 mg Oral Daily   gabapentin  100 mg Oral BID   And   gabapentin  300 mg Oral QHS   insulin aspart  0-15 Units Subcutaneous TID WC   insulin aspart  0-5 Units Subcutaneous QHS   nadolol  40 mg Oral QHS   spironolactone  25 mg Oral Daily   Continuous Infusions:  Nutritional status     Body mass index is 39.43 kg/m.  Data Reviewed:   CBC: Recent Labs  Lab 04/07/23 1603 04/08/23 0522 04/09/23 0249 04/10/23 0247 04/11/23 0227 04/11/23 1520 04/11/23 1524 04/12/23 0249 04/13/23 0228  WBC 2.5*   < > 2.7* 2.7* 3.0*  --   --  2.7* 2.9*  NEUTROABS 0.8*  --   --   --   --   --   --   --   --   HGB 13.0   < > 13.2 12.7 13.3 13.9 13.9  13.9 12.5 13.1  HCT 42.3   < > 42.5 40.4 42.3 41.0 41.0  41.0 41.3 41.9  MCV 100.0   < > 99.3 99.0 96.8  --   --  97.9 98.6  PLT 48*   < > 45* 42* 44*  --   --  43* 45*   < > = values in this interval not displayed.   Basic Metabolic Panel: Recent Labs  Lab 04/09/23 0249 04/10/23 0247 04/11/23 0227  04/11/23 1520 04/11/23 1524 04/12/23 0249 04/13/23 0228  NA 143 140 140 141 140  140 139 140  K 3.9 4.1 4.2 3.9 3.9  3.9 4.8 4.0  CL 96* 93* 88*  --   --  91* 89*  CO2 41* 43* 44*  --   --  40* 43*  GLUCOSE 88 119* 96  --   --  107* 99  BUN 11 18 19   --   --  23 24*  CREATININE 0.79 0.88 0.83  --   --  0.79 0.80  CALCIUM 8.6* 8.4* 8.5*  --   --  8.4* 8.6*  MG  --  1.5* 1.7  --   --  2.2 2.1  PHOS  --  4.3  --   --   --   --   --    GFR: Estimated Creatinine Clearance: 78.1 mL/min (by C-G formula based on SCr of 0.8 mg/dL). Liver Function Tests: Recent Labs  Lab 04/07/23 1603 04/08/23 0522  AST 34 32  ALT 11 13  ALKPHOS 49 51  BILITOT 1.0 0.8  PROT 5.5* 5.7*  ALBUMIN 2.3* 2.2*   No results for input(s): "LIPASE", "AMYLASE" in the last 168 hours. No results for input(s): "AMMONIA" in the last 168 hours. Coagulation Profile: Recent Labs  Lab 04/07/23 1603 04/11/23 0227  INR 1.9* 1.6*   Cardiac Enzymes: No results for input(s): "CKTOTAL", "CKMB", "CKMBINDEX", "TROPONINI" in the last 168 hours. BNP (last 3 results) Recent Labs    03/13/23 1428  PROBNP 996.0*   HbA1C: No results for input(s): "HGBA1C" in the last 72 hours. CBG: Recent Labs  Lab 04/12/23 0558 04/12/23 1053 04/12/23 1610 04/12/23 2120 04/13/23 0615  GLUCAP 93 121* 92 139* 91   Lipid Profile: No results for input(s): "CHOL", "HDL", "LDLCALC", "TRIG", "CHOLHDL", "LDLDIRECT" in the last 72 hours. Thyroid Function Tests: No results for input(s): "TSH", "T4TOTAL", "FREET4", "T3FREE", "THYROIDAB" in the last 72 hours. Anemia Panel: No results for input(s): "VITAMINB12", "FOLATE", "FERRITIN", "TIBC", "IRON", "RETICCTPCT" in the last 72 hours. Sepsis Labs: No results for input(s): "PROCALCITON", "LATICACIDVEN" in the last 168 hours.  No results found for this or any previous visit (from the past 240 hour(s)).       Radiology Studies: CT Chest High Resolution  Result Date:  04/12/2023 CLINICAL DATA:  Pulmonary hypertension, assess for parenchymal lung disease EXAM: CT CHEST WITHOUT CONTRAST TECHNIQUE: Multidetector CT imaging of the chest was performed following the standard protocol without intravenous contrast. High resolution imaging of the lungs, as well as inspiratory and expiratory imaging, was performed. RADIATION DOSE REDUCTION: This exam was performed according to the departmental dose-optimization program which includes automated exposure control, adjustment of the mA and/or kV according to patient size and/or use of iterative reconstruction technique. COMPARISON:  04/14/2022 FINDINGS: Cardiovascular: Aortic atherosclerosis. Aortic valve prosthesis. Cardiomegaly. Left and right coronary artery calcifications. Dense mitral annulus calcifications. Enlargement of the main pulmonary artery measuring up to 3.9 cm in caliber. No pericardial effusion. Mediastinum/Nodes: No enlarged mediastinal, hilar, or axillary lymph nodes. Thyroid gland, trachea, and esophagus demonstrate no significant findings. Lungs/Pleura: Mild tracheobronchomalacia on expiratory phase imaging. No significant air trapping. Diffuse bilateral bronchial wall thickening. Bibasilar scarring or atelectasis, right-greater-than-left, with elevation of the right hemidiaphragm, appearance similar to prior examination. Trace left pleural effusion, similar to prior. Upper Abdomen: No acute abnormality. Coarse, nodular cirrhotic morphology of the liver. Large varices in the left upper quadrant. Musculoskeletal: No chest wall abnormality. No acute osseous findings. IMPRESSION: 1. Bibasilar scarring or atelectasis, right-greater-than-left, with elevation of the right hemidiaphragm, appearance similar to prior examination. Trace left pleural effusion, similar to prior. No specific evidence of fibrotic interstitial lung disease. 2. Diffuse bilateral bronchial wall thickening. 3. Mild tracheobronchomalacia on expiratory phase  imaging. 4. Enlargement of the main pulmonary artery, as can be seen in pulmonary hypertension. 5. Cardiomegaly and coronary artery disease. 6. Cirrhosis. Large varices in the left upper quadrant. Aortic Atherosclerosis (ICD10-I70.0). Electronically Signed   By: Jearld Lesch M.D.   On: 04/12/2023 14:15   NM Pulmonary Perfusion  Result Date: 04/12/2023 CLINICAL DATA:  Pulmonary hypertension. Assess for chronic pulmonary embolism. EXAM: NUCLEAR MEDICINE PERFUSION LUNG SCAN TECHNIQUE: Perfusion images were obtained in multiple projections after intravenous injection  of radiopharmaceutical. Ventilation scans intentionally deferred if perfusion scan and chest x-ray adequate for interpretation during COVID 19 epidemic. RADIOPHARMACEUTICALS:  4.2 mCi Tc-23m MAA IV COMPARISON:  CT chest 04/12/2023 and chest radiograph from 04/12/2023. FINDINGS: There is a medium size wedge-shaped perfusion defect within the periphery of the right mid lung. There is also a medium size segmental perfusion defect within the right base. A large segmental perfusion defect is identified localizing to the posterior right midlung. IMPRESSION: Bilateral segmental perfusion defects are identified. Although nonspecific, in the appropriate clinical setting these findings may reflect chronic pulmonary embolism. These findings may also be seen with acute pulmonary embolism, obstructive pulmonary disease, atelectasis or pneumonia. Electronically Signed   By: Signa Kell M.D.   On: 04/12/2023 12:25   DG Chest Port 1 View  Result Date: 04/12/2023 CLINICAL DATA:  Dyspnea. EXAM: PORTABLE CHEST 1 VIEW COMPARISON:  04/07/2023 FINDINGS: Low volume film with stable asymmetric elevation right hemidiaphragm. The cardio pericardial silhouette is enlarged. Vascular congestion with diffuse interstitial opacity suggests edema. Telemetry leads overlie the chest. IMPRESSION: Low volume film with vascular congestion and diffuse interstitial opacity suggesting  edema. Electronically Signed   By: Kennith Center M.D.   On: 04/12/2023 08:22   CARDIAC CATHETERIZATION  Result Date: 04/11/2023 1. Elevated right-sided filling pressure. 2. Normal PCWP. 3. Moderate pulmonary arterial hypertension.  PVR 4.11 WU.  With high cardiac output, also likely a component of high output pulmonary hypertension (in setting of cirrhosis). 4. High cardiac output. 5. No step-up in oxygen saturation. 6. Preserved PAPi. Possible portopulmonary hypertension.  High cardiac output with cirrhosis may also contribute.  Needs some diuresis for RV failure and needs workup of pulmonary hypertension, would get V/Q scan while inpatient and make sure autoimmune serologies have been done.           LOS: 6 days   Time spent= 35 mins    Miguel Rota, MD [facility]  If 7PM-7AM, please contact night-coverage  04/13/2023, 11:13 AM

## 2023-04-13 NOTE — Progress Notes (Signed)
Mobility Specialist Progress Note:   04/13/23 0920  Mobility  Activity Ambulated with assistance in hallway  Level of Assistance Contact guard assist, steadying assist  Assistive Device None  Distance Ambulated (ft) 400 ft  Activity Response Tolerated well  Mobility Referral Yes  Mobility visit 1 Mobility  Mobility Specialist Start Time (ACUTE ONLY) 0920  Mobility Specialist Stop Time (ACUTE ONLY) 0930  Mobility Specialist Time Calculation (min) (ACUTE ONLY) 10 min   Pt agreeable to mobility session. Required minG assist for safety d/t occasional minor LOB, which pt able to correct. SpO2 88-92% on 3LO2. Back sitting EOB  with all needs met.   Addison Lank Mobility Specialist Please contact via SecureChat or  Rehab office at [phone]

## 2023-04-13 NOTE — Plan of Care (Signed)
Pt appears to be coping well with diagnosis and prognosis

## 2023-04-14 DIAGNOSIS — Z952 Presence of prosthetic heart valve: Secondary | ICD-10-CM | POA: Diagnosis not present

## 2023-04-14 DIAGNOSIS — R601 Generalized edema: Secondary | ICD-10-CM | POA: Diagnosis not present

## 2023-04-14 DIAGNOSIS — J9611 Chronic respiratory failure with hypoxia: Secondary | ICD-10-CM | POA: Diagnosis not present

## 2023-04-14 DIAGNOSIS — K7581 Nonalcoholic steatohepatitis (NASH): Secondary | ICD-10-CM | POA: Diagnosis not present

## 2023-04-14 DIAGNOSIS — R600 Localized edema: Secondary | ICD-10-CM | POA: Diagnosis not present

## 2023-04-14 LAB — CBC
HCT: 41.8 % (ref 36.0–46.0)
Hemoglobin: 12.8 g/dL (ref 12.0–15.0)
MCH: 30.3 pg (ref 26.0–34.0)
MCHC: 30.6 g/dL (ref 30.0–36.0)
MCV: 98.8 fL (ref 80.0–100.0)
Platelets: 47 10*3/uL — ABNORMAL LOW (ref 150–400)
RBC: 4.23 MIL/uL (ref 3.87–5.11)
RDW: 14.9 % (ref 11.5–15.5)
WBC: 3 10*3/uL — ABNORMAL LOW (ref 4.0–10.5)
nRBC: 0 % (ref 0.0–0.2)

## 2023-04-14 LAB — BASIC METABOLIC PANEL
Anion gap: 7 (ref 5–15)
BUN: 22 mg/dL (ref 8–23)
CO2: 43 mmol/L — ABNORMAL HIGH (ref 22–32)
Calcium: 8.3 mg/dL — ABNORMAL LOW (ref 8.9–10.3)
Chloride: 90 mmol/L — ABNORMAL LOW (ref 98–111)
Creatinine, Ser: 0.79 mg/dL (ref 0.44–1.00)
GFR, Estimated: >60 mL/min (ref >60)
Glucose, Bld: 95 mg/dL (ref 70–99)
Potassium: 4 mmol/L (ref 3.5–5.1)
Sodium: 140 mmol/L (ref 135–145)

## 2023-04-14 LAB — GLUCOSE, CAPILLARY
Glucose-Capillary: 89 mg/dL (ref 70–99)
Glucose-Capillary: 97 mg/dL (ref 70–99)

## 2023-04-14 LAB — MAGNESIUM: Magnesium: 1.9 mg/dL (ref 1.7–2.4)

## 2023-04-14 MED ORDER — FUROSEMIDE 20 MG PO TABS: 20.0000 mg | ORAL_TABLET | Freq: Every day | ORAL | 0 refills | Status: DC | PRN

## 2023-04-14 MED ORDER — SPIRONOLACTONE 25 MG PO TABS: 25.0000 mg | ORAL_TABLET | Freq: Every day | ORAL | 0 refills | Status: DC

## 2023-04-14 NOTE — Progress Notes (Addendum)
Discharge teaching complete. Meds, diet, activity, follow up appointments CHF education reviewed and all questions answered. Copy of instructions given to patient and prescriptions sent to [facility].   Waiting for son to arrive for discharge.

## 2023-04-14 NOTE — Plan of Care (Signed)
  Problem: Coping: Goal: Ability to adjust to condition or change in health will improve Outcome: Progressing   Problem: Fluid Volume: Goal: Ability to maintain a balanced intake and output will improve Outcome: Progressing

## 2023-04-14 NOTE — Discharge Summary (Signed)
Physician Discharge Summary  Whitney [name] NWG:[id number] DOB: 01-Feb-1956 DOA: 04/07/2023  PCP: Daisy Floro, MD  Admit date: 04/07/2023 Discharge date: 04/14/2023  Admitted From: Home Disposition: Home Home  Recommendations for Outpatient Follow-up:  Follow up with PCP in 1-2 weeks Please obtain BMP/CBC in one week your next doctors visit.  Continue daily Aldactone.  Lasix 20 mg p.o. as needed.  Outpatient follow-up with cardiology Show follow-up outpatient gastroenterology and get her screening endoscopy done for esophageal varices.  She follows with [facility]   Discharge Condition: Stable CODE STATUS: Full code Diet recommendation: Diabetic  Brief/Interim Summary: Brief Narrative:  67 y.o. female with medical history significant of NASH cirrhosis, severe aortic stenosis status post AVR, type 2 diabetes diet-controlled, PAF (Eliquis), Sjogren syndrome, sleep apnea but no longer on CPAP as she uses 3 to 4 L nasal cannula due to recently diagnosed with COPD but is a non-smoker who presented for shortness of breath with worsening peripheral edema.  RHC was performed on 12/5 which showed elevated right-sided pressure, normal wedge pressure with high cardiac output.  There was concerns of portal pulmonary hypertension.  Recommended diuresis, VQ scan and high-resolution CT. VQ scan showed possible chronic VTE and bronchial scarring, CT chest high-resolution showed chronic bronchial scarring and atelectasis.  CTA chest is negative for PE. Eventually upon discharge cardiology recommended daily Aldactone, as needed Lasix and outpatient follow-up.  Assessment & Plan:  Principal Problem:   Anasarca Active Problems:   Cirrhosis (HCC)   Paroxysmal atrial fibrillation (HCC)   Sjogren's syndrome (HCC)   Hyperlipidemia   Severe aortic stenosis   S/P AVR   DM2 (diabetes mellitus, type 2) (HCC)   NASH (nonalcoholic steatohepatitis)   Thrombocytopenia (HCC)   Other neutropenia (HCC)      Bilateral lower extremity swelling Pulmonary edema, mild There is concerns of some volume overload in the setting of Nash cirrhosis.  Diuresis per cardiology team.  Upon discharge recommended daily Aldactone, as needed Lasix and outpatient follow-up for further adjustment.  Bilateral lower extremity compression stockings given.   Aortic valve stenosis status post supportive valve replacement.  Pulmonary hypertension Echo performed 03/14/2023 showing LV function 65 to 70%, moderate LV concentric hypertrophy, moderately reduced RV systolic function, severely elevated pulmonary arterial systolic pressure with dilated left and right atria and prosthesis patient mismatch with mildly elevated gradients.  -RHC showing concerns of portal pulmonary HTN with elevated right-sided heart pressures.   -VQ scan overall is inconclusive as differential is broad including chronic embolic disease versus atelectasis versus pneumonia.  -CTA chest is negative for PE -CT high-resolution shows chronic bronchial scarring, atelectasis, cirrhosis, left upper quadrant varices.   Outpatient CHF clinic follow-up.   NASH cirrhosis Follows with [facility].  Continue diuretics - Liver ultrasound shows hepatic steatosis. Had Hx of Small EV on EGD 2021, due to repeat EGD per outptn [facility] records and she should follow-up with their service outpatient.   Chronic respiratory failure History of COPD, recent diagnosis Follows outpatient pulmonology.  Continue supportive care.  As needed bronchodilators.   Non-insulin-dependent type 2 diabetes Reports of diet control. Has been prescribed Ozempic likely for weight loss.  A1c is 5.8.   Paroxysmal A-fib Nadolol and Eliquis.    Thrombocytopenia, neutropenia Per gi note has been evaluated by heme and these thought to be 2/2 cirrhosis   Sjogren syndrome She has switching to new rheumatologist here at home.  There is concern for pulmonary arterial hypertension based on previous  echo.  Hyperlipidemia Continue home Zetia  HypoMg - Repletion     DVT prophylaxis: home apixaban Code Status: full Family Communication:    Level of care: Telemetry Cardiac Status is: Inpatient Remains inpatient appropriate because: Discharge today  Subjective: Slight dizziness but overall feels much better.   Examination:  General exam: Appears calm and comfortable  Respiratory system: Clear to auscultation. Respiratory effort normal. Cardiovascular system: S1 & S2 heard, RRR. No JVD, murmurs, rubs, gallops or clicks. 1+ b/l LE pitting edema.  Gastrointestinal system: Abdomen is nondistended, soft and nontender. No organomegaly or masses felt. Normal bowel sounds heard. Central nervous system: Alert and oriented. No focal neurological deficits. Extremities: Symmetric 5 x 5 power. Skin: No rashes, lesions or ulcers Psychiatry: Judgement and insight appear normal. Mood & affect appropriate.    Discharge Diagnoses:  Principal Problem:   Anasarca Active Problems:   Cirrhosis (HCC)   Paroxysmal atrial fibrillation (HCC)   Sjogren's syndrome (HCC)   Hyperlipidemia   Severe aortic stenosis   S/P AVR   DM2 (diabetes mellitus, type 2) (HCC)   Pulmonary hypertension, unspecified (HCC)   NASH (nonalcoholic steatohepatitis)   Thrombocytopenia (HCC)   Other neutropenia (HCC)     Discharge Exam: Vitals:   04/14/23 0801 04/14/23 0819  BP:  118/60  Pulse:  70  Resp:  18  Temp:  98.2 F (36.8 C)  SpO2: 94% 93%   Vitals:   04/14/23 0547 04/14/23 0800 04/14/23 0801 04/14/23 0819  BP: 130/72   118/60  Pulse: 61   70  Resp: 17   18  Temp: 98.2 F (36.8 C)   98.2 F (36.8 C)  TempSrc: Oral   Oral  SpO2: 93% 93% 94% 93%  Weight: 105.2 kg     Height:        Discharge Instructions  Discharge Instructions     AMB Referral to [facility]   Complete by: As directed    Reason for referral: Other Comment - Child-Pugh B, limiting medication  options (on zetia, PMH stroke, T2DM)   Internal Lipid Clinic Referral Scheduling  Internal lipid clinic referrals are providers within [facility], who wish to refer established patients for routine management (help in starting PCSK9 inhibitor therapy) or advanced therapies.  Internal MD referral criteria:              1. All patients with LDL>190 mg/dL  2. All patients with Triglycerides >500 mg/dL  3. Patients with suspected or confirmed heterozygous familial hyperlipidemia (HeFH) or homozygous familial hyperlipidemia (HoFH)  4. Patients with family history of suspicious for genetic dyslipidemia desiring genetic testing  5. Patients refractory to standard guideline based therapy  6. Patients with statin intolerance (failed 2 statins, one of which must be a high potency statin)  7. Patients who the provider desires to be seen by MD   Internal PharmD referral criteria:   1. Follow-up patients for medication management  2. Follow-up for compliance monitoring  3. Patients for drug education  4. Patients with statin intolerance  5. PCSK9 inhibitor education and prior authorization approvals  6. Patients with triglycerides <500 mg/dL  External Lipid Clinic Referral  External lipid clinic referrals are for providers outside of [facility], considered new clinic patients - automatically routed to MD schedule      Allergies as of 04/14/2023       Reactions   Prednisone    Other Reaction(s): delerium   Doxycycline Hives, Rash   Naproxen Rash, Hives  Medication List     TAKE these medications    albuterol 108 (90 Base) MCG/ACT inhaler Commonly known as: VENTOLIN HFA INHALE 2 PUFFS INTO THE LUNGS EVERY 4 HOURS AS NEEDED FOR WHEEZING OR SHORTNESS OF BREATH.   albuterol (2.5 MG/3ML) 0.083% nebulizer solution Commonly known as: PROVENTIL Take 3 mLs (2.5 mg total) by nebulization every 6 (six) hours as needed for wheezing or shortness of breath.   B-12 PO Take 1 tablet by  mouth in the morning.   Eliquis 5 MG Tabs tablet Generic drug: apixaban TAKE 1 TABLET BY MOUTH TWICE A DAY   ezetimibe 10 MG tablet Commonly known as: ZETIA Take 10 mg by mouth at bedtime.   furosemide 20 MG tablet Commonly known as: Lasix Take 1 tablet (20 mg total) by mouth daily as needed.   gabapentin 100 MG capsule Commonly known as: Neurontin Take 1 capsule (100 mg total) by mouth 2 (two) times daily at 10 am and 4 pm AND 3 capsules (300 mg total) at bedtime.   MAGNESIUM GLUCONATE PO Take 1 tablet by mouth daily.   nadolol 40 MG tablet Commonly known as: CORGARD TAKE 1 TABLET BY MOUTH EVERY DAY What changed: when to take this   Ozempic (0.25 or 0.5 MG/DOSE) 2 MG/3ML Sopn Generic drug: Semaglutide(0.25 or 0.5MG /DOS) Inject 2 mg into the skin once a week.   spironolactone 25 MG tablet Commonly known as: ALDACTONE Take 1 tablet (25 mg total) by mouth daily. Start taking on: April 15, 2023   Stiolto Respimat 2.5-2.5 MCG/ACT Aers Generic drug: Tiotropium Bromide-Olodaterol INHALE 2 PUFFS INTO THE LUNGS DAILY. INHALE 2 PUFFS BY MOUTH INTO THE LUNGS DAILY   zinc gluconate 50 MG tablet Take 50 mg by mouth daily.        Follow-up Information     Daisy Floro, MD Follow up in 1 week(s).   Specialty: Family Medicine Contact information: [address] [place] [address] [phone]                Allergies  Allergen Reactions   Prednisone     Other Reaction(s): delerium   Doxycycline Hives and Rash   Naproxen Rash and Hives    You were cared for by a hospitalist during your hospital stay. If you have any questions about your discharge medications or the care you received while you were in the hospital after you are discharged, you can call the unit and asked to speak with the hospitalist on call if the hospitalist that took care of you is not available. Once you are discharged, your primary care physician will handle any further  medical issues. Please note that no refills for any discharge medications will be authorized once you are discharged, as it is imperative that you return to your primary care physician (or establish a relationship with a primary care physician if you do not have one) for your aftercare needs so that they can reassess your need for medications and monitor your lab values.  You were cared for by a hospitalist during your hospital stay. If you have any questions about your discharge medications or the care you received while you were in the hospital after you are discharged, you can call the unit and asked to speak with the hospitalist on call if the hospitalist that took care of you is not available. Once you are discharged, your primary care physician will handle any further medical issues. Please note that NO REFILLS for any discharge  medications will be authorized once you are discharged, as it is imperative that you return to your primary care physician (or establish a relationship with a primary care physician if you do not have one) for your aftercare needs so that they can reassess your need for medications and monitor your lab values.  Please request your Prim.MD to go over all Hospital Tests and Procedure/Radiological results at the follow up, please get all Hospital records sent to your Prim MD by signing hospital release before you go home.  Get CBC, CMP, 2 view Chest X ray checked  by Primary MD during your next visit or SNF MD in 5-7 days ( we routinely change or add medications that can affect your baseline labs and fluid status, therefore we recommend that you get the mentioned basic workup next visit with your PCP, your PCP may decide not to get them or add new tests based on their clinical decision)  On your next visit with your primary care physician please Get Medicines reviewed and adjusted.  If you experience worsening of your admission symptoms, develop shortness of breath, life  threatening emergency, suicidal or homicidal thoughts you must seek medical attention immediately by calling 911 or calling your MD immediately  if symptoms less severe.  You Must read complete instructions/literature along with all the possible adverse reactions/side effects for all the Medicines you take and that have been prescribed to you. Take any new Medicines after you have completely understood and accpet all the possible adverse reactions/side effects.   Do not drive, operate heavy machinery, perform activities at heights, swimming or participation in water activities or provide baby sitting services if your were admitted for syncope or siezures until you have seen by Primary MD or a Neurologist and advised to do so again.  Do not drive when taking Pain medications.   Procedures/Studies: CT Angio Chest Pulmonary Embolism (PE) W or WO Contrast  Result Date: 04/13/2023 CLINICAL DATA:  Pulmonary hypertension, concern for pulmonary embolism. EXAM: CT ANGIOGRAPHY CHEST WITH CONTRAST TECHNIQUE: Multidetector CT imaging of the chest was performed using the standard protocol during bolus administration of intravenous contrast. Multiplanar CT image reconstructions and MIPs were obtained to evaluate the vascular anatomy. RADIATION DOSE REDUCTION: This exam was performed according to the departmental dose-optimization program which includes automated exposure control, adjustment of the mA and/or kV according to patient size and/or use of iterative reconstruction technique. CONTRAST:  75mL OMNIPAQUE IOHEXOL 350 MG/ML SOLN COMPARISON:  CT chest dated 04/12/2023. FINDINGS: Cardiovascular: Satisfactory opacification of the pulmonary arteries to the segmental level. No evidence of pulmonary embolism. The main pulmonary artery is enlarged, measuring 3.7 cm in diameter, suggestive of pulmonary hypertension. Vascular calcifications are seen in the coronary arteries and aortic arch. The heart is enlarged. No  pericardial effusion. Mediastinum/Nodes: No enlarged mediastinal, hilar, or axillary lymph nodes. Thyroid gland, trachea, and esophagus demonstrate no significant findings. Lungs/Pleura: Bilateral bronchial wall thickening is redemonstrated. There is bibasilar scarring/atelectasis, unchanged. A trace left pleural effusion is redemonstrated. No pneumothorax on either side. Upper Abdomen: The liver is shrunken with a nodular surface contour, consistent with hepatic cirrhosis. Varices are seen in the left upper quadrant. Patient is status post a sleeve gastrectomy. Musculoskeletal: Degenerative changes are seen in the spine. Review of the MIP images confirms the above findings. IMPRESSION: 1. No evidence of pulmonary embolism. Enlarged main pulmonary artery, suggestive of pulmonary hypertension. 2. Bilateral bronchial wall thickening is redemonstrated. Trace left pleural effusion. 3. Hepatic cirrhosis. Aortic Atherosclerosis (ICD10-I70.0).  Electronically Signed   By: Romona Curls M.D.   On: 04/13/2023 15:07   CT Chest High Resolution  Result Date: 04/12/2023 CLINICAL DATA:  Pulmonary hypertension, assess for parenchymal lung disease EXAM: CT CHEST WITHOUT CONTRAST TECHNIQUE: Multidetector CT imaging of the chest was performed following the standard protocol without intravenous contrast. High resolution imaging of the lungs, as well as inspiratory and expiratory imaging, was performed. RADIATION DOSE REDUCTION: This exam was performed according to the departmental dose-optimization program which includes automated exposure control, adjustment of the mA and/or kV according to patient size and/or use of iterative reconstruction technique. COMPARISON:  04/14/2022 FINDINGS: Cardiovascular: Aortic atherosclerosis. Aortic valve prosthesis. Cardiomegaly. Left and right coronary artery calcifications. Dense mitral annulus calcifications. Enlargement of the main pulmonary artery measuring up to 3.9 cm in caliber. No  pericardial effusion. Mediastinum/Nodes: No enlarged mediastinal, hilar, or axillary lymph nodes. Thyroid gland, trachea, and esophagus demonstrate no significant findings. Lungs/Pleura: Mild tracheobronchomalacia on expiratory phase imaging. No significant air trapping. Diffuse bilateral bronchial wall thickening. Bibasilar scarring or atelectasis, right-greater-than-left, with elevation of the right hemidiaphragm, appearance similar to prior examination. Trace left pleural effusion, similar to prior. Upper Abdomen: No acute abnormality. Coarse, nodular cirrhotic morphology of the liver. Large varices in the left upper quadrant. Musculoskeletal: No chest wall abnormality. No acute osseous findings. IMPRESSION: 1. Bibasilar scarring or atelectasis, right-greater-than-left, with elevation of the right hemidiaphragm, appearance similar to prior examination. Trace left pleural effusion, similar to prior. No specific evidence of fibrotic interstitial lung disease. 2. Diffuse bilateral bronchial wall thickening. 3. Mild tracheobronchomalacia on expiratory phase imaging. 4. Enlargement of the main pulmonary artery, as can be seen in pulmonary hypertension. 5. Cardiomegaly and coronary artery disease. 6. Cirrhosis. Large varices in the left upper quadrant. Aortic Atherosclerosis (ICD10-I70.0). Electronically Signed   By: Jearld Lesch M.D.   On: 04/12/2023 14:15   NM Pulmonary Perfusion  Result Date: 04/12/2023 CLINICAL DATA:  Pulmonary hypertension. Assess for chronic pulmonary embolism. EXAM: NUCLEAR MEDICINE PERFUSION LUNG SCAN TECHNIQUE: Perfusion images were obtained in multiple projections after intravenous injection of radiopharmaceutical. Ventilation scans intentionally deferred if perfusion scan and chest x-ray adequate for interpretation during COVID 19 epidemic. RADIOPHARMACEUTICALS:  4.2 mCi Tc-32m MAA IV COMPARISON:  CT chest 04/12/2023 and chest radiograph from 04/12/2023. FINDINGS: There is a medium size  wedge-shaped perfusion defect within the periphery of the right mid lung. There is also a medium size segmental perfusion defect within the right base. A large segmental perfusion defect is identified localizing to the posterior right midlung. IMPRESSION: Bilateral segmental perfusion defects are identified. Although nonspecific, in the appropriate clinical setting these findings may reflect chronic pulmonary embolism. These findings may also be seen with acute pulmonary embolism, obstructive pulmonary disease, atelectasis or pneumonia. Electronically Signed   By: Signa Kell M.D.   On: 04/12/2023 12:25   DG Chest Port 1 View  Result Date: 04/12/2023 CLINICAL DATA:  Dyspnea. EXAM: PORTABLE CHEST 1 VIEW COMPARISON:  04/07/2023 FINDINGS: Low volume film with stable asymmetric elevation right hemidiaphragm. The cardio pericardial silhouette is enlarged. Vascular congestion with diffuse interstitial opacity suggests edema. Telemetry leads overlie the chest. IMPRESSION: Low volume film with vascular congestion and diffuse interstitial opacity suggesting edema. Electronically Signed   By: Kennith Center M.D.   On: 04/12/2023 08:22   CARDIAC CATHETERIZATION  Result Date: 04/11/2023 1. Elevated right-sided filling pressure. 2. Normal PCWP. 3. Moderate pulmonary arterial hypertension.  PVR 4.11 WU.  With high cardiac output, also likely a component of  high output pulmonary hypertension (in setting of cirrhosis). 4. High cardiac output. 5. No step-up in oxygen saturation. 6. Preserved PAPi. Possible portopulmonary hypertension.  High cardiac output with cirrhosis may also contribute.  Needs some diuresis for RV failure and needs workup of pulmonary hypertension, would get V/Q scan while inpatient and make sure autoimmune serologies have been done.   US Abdomen Limited  Result Date: 04/08/2023 CLINICAL DATA:  Edema. EXAM: ULTRASOUND ABDOMEN LIMITED RIGHT UPPER QUADRANT COMPARISON:  Sep 19, 2017 FINDINGS:  Gallbladder: No gallstones are visualized. The gallbladder wall measures 5.4 mm in thickness. No sonographic Murphy sign noted by sonographer. Common bile duct: Diameter: 4.0 mm Liver: No focal lesion identified. The liver parenchyma is nodular in contour and increased in echogenicity. Portal vein is patent on color Doppler imaging with normal direction of blood flow towards the liver. Other: None. IMPRESSION: Hepatic steatosis and hepatic cirrhosis without focal liver lesions. Electronically Signed   By: Aram Candela M.D.   On: 04/08/2023 21:44   DG Chest Port 1 View  Result Date: 04/07/2023 CLINICAL DATA:  Fluid overload EXAM: PORTABLE CHEST 1 VIEW COMPARISON:  Chest radiograph dated 03/13/2023 FINDINGS: Lines/tubes: Implanted loop recorder projects over the medial left mid chest. Lungs: Asymmetric elevation of the right hemidiaphragm, unchanged. Low lung volumes with bronchovascular crowding. Mild bilateral interstitial opacities. Pleura: No pneumothorax or pleural effusion. Heart/mediastinum: Similar enlarged, postsurgical cardiomediastinal silhouette. Bones: Median sternotomy wires are nondisplaced. IMPRESSION: 1. Mild bilateral interstitial opacities, which may represent pulmonary edema. 2. Similar cardiomegaly. Electronically Signed   By: Agustin Cree M.D.   On: 04/07/2023 17:23   CUP PACEART REMOTE DEVICE CHECK  Result Date: 04/01/2023 ILR summary report received. Battery status OK. Normal device function. No new symptom, tachy, brady, or pause episodes. No new AF episodes. Monthly summary reports and ROV/PRN - CS, CVRS  MR BRAIN WO CONTRAST  Result Date: 03/17/2023 CLINICAL DATA:  Stroke suspected EXAM: MRI HEAD WITHOUT CONTRAST TECHNIQUE: Multiplanar, multiecho pulse sequences of the brain and surrounding structures were obtained without intravenous contrast. COMPARISON:  05/23/2022 MRI head, correlation is also made with 03/17/2023 CT head FINDINGS: Brain: No restricted diffusion to  suggest acute or subacute infarct. No acute hemorrhage, mass, mass effect, or midline shift. No hydrocephalus or extra-axial collection. Partial empty sella. Craniocervical junction within normal limits. No hemosiderin deposition to suggest remote hemorrhage. Normal cerebral volume. Scattered T2 hyperintense signal in the periventricular white matter, likely the sequela of mild chronic small vessel ischemic disease. Vascular: Normal arterial flow voids. Skull and upper cervical spine: Normal marrow signal. Sinuses/Orbits: Clear paranasal sinuses. No acute finding in the orbits. Other: Trace fluid in right mastoid air cells. IMPRESSION: No acute intracranial process. No evidence of acute or subacute infarct. Electronically Signed   By: Wiliam Ke M.D.   On: 03/17/2023 22:39   CT HEAD WO CONTRAST  Result Date: 03/17/2023 CLINICAL DATA:  Fatigue and speech issues. EXAM: CT HEAD WITHOUT CONTRAST TECHNIQUE: Contiguous axial images were obtained from the base of the skull through the vertex without intravenous contrast. RADIATION DOSE REDUCTION: This exam was performed according to the departmental dose-optimization program which includes automated exposure control, adjustment of the mA and/or kV according to patient size and/or use of iterative reconstruction technique. COMPARISON:  May 23, 2022 FINDINGS: Brain: There is mild cerebral atrophy with widening of the extra-axial spaces and ventricular dilatation. There are areas of decreased attenuation within the white matter tracts of the supratentorial brain, consistent with microvascular disease changes. Vascular:  Marked severity bilateral cavernous carotid artery calcification is seen. Skull: Normal. Negative for fracture or focal lesion. Sinuses/Orbits: There is mild right ethmoid sinus mucosal thickening. A 7 mm left ethmoid sinus osteoma is also noted. Other: None. IMPRESSION: 1. Generalized cerebral atrophy with chronic white matter small vessel ischemic  changes. 2. No acute intracranial abnormality. 3. Mild right ethmoid sinus disease. Electronically Signed   By: Aram Candela M.D.   On: 03/17/2023 19:51     The results of significant diagnostics from this hospitalization (including imaging, microbiology, ancillary and laboratory) are listed below for reference.     Microbiology: No results found for this or any previous visit (from the past 240 hour(s)).   Labs: BNP (last 3 results) Recent Labs    03/13/23 2100 04/07/23 1603  BNP 694.6* 515.8*   Basic Metabolic Panel: Recent Labs  Lab 04/10/23 0247 04/11/23 0227 04/11/23 1520 04/11/23 1524 04/12/23 0249 04/13/23 0228 04/14/23 0254  NA 140 140 141 140  140 139 140 140  K 4.1 4.2 3.9 3.9  3.9 4.8 4.0 4.0  CL 93* 88*  --   --  91* 89* 90*  CO2 43* 44*  --   --  40* 43* 43*  GLUCOSE 119* 96  --   --  107* 99 95  BUN 18 19  --   --  23 24* 22  CREATININE 0.88 0.83  --   --  0.79 0.80 0.79  CALCIUM 8.4* 8.5*  --   --  8.4* 8.6* 8.3*  MG 1.5* 1.7  --   --  2.2 2.1 1.9  PHOS 4.3  --   --   --   --   --   --    Liver Function Tests: Recent Labs  Lab 04/07/23 1603 04/08/23 0522  AST 34 32  ALT 11 13  ALKPHOS 49 51  BILITOT 1.0 0.8  PROT 5.5* 5.7*  ALBUMIN 2.3* 2.2*   No results for input(s): "LIPASE", "AMYLASE" in the last 168 hours. No results for input(s): "AMMONIA" in the last 168 hours. CBC: Recent Labs  Lab 04/07/23 1603 04/08/23 0522 04/10/23 0247 04/11/23 0227 04/11/23 1520 04/11/23 1524 04/12/23 0249 04/13/23 0228 04/14/23 0254  WBC 2.5*   < > 2.7* 3.0*  --   --  2.7* 2.9* 3.0*  NEUTROABS 0.8*  --   --   --   --   --   --   --   --   HGB 13.0   < > 12.7 13.3 13.9 13.9  13.9 12.5 13.1 12.8  HCT 42.3   < > 40.4 42.3 41.0 41.0  41.0 41.3 41.9 41.8  MCV 100.0   < > 99.0 96.8  --   --  97.9 98.6 98.8  PLT 48*   < > 42* 44*  --   --  43* 45* 47*   < > = values in this interval not displayed.   Cardiac Enzymes: No results for input(s):  "CKTOTAL", "CKMB", "CKMBINDEX", "TROPONINI" in the last 168 hours. BNP: Invalid input(s): "POCBNP" CBG: Recent Labs  Lab 04/13/23 0615 04/13/23 1113 04/13/23 1615 04/13/23 2056 04/14/23 0547  GLUCAP 91 118* 136* 111* 89   D-Dimer No results for input(s): "DDIMER" in the last 72 hours. Hgb A1c No results for input(s): "HGBA1C" in the last 72 hours. Lipid Profile No results for input(s): "CHOL", "HDL", "LDLCALC", "TRIG", "CHOLHDL", "LDLDIRECT" in the last 72 hours. Thyroid function studies No results for input(s): "TSH", "T4TOTAL", "T3FREE", "THYROIDAB"  in the last 72 hours.  Invalid input(s): "FREET3" Anemia work up No results for input(s): "VITAMINB12", "FOLATE", "FERRITIN", "TIBC", "IRON", "RETICCTPCT" in the last 72 hours. Urinalysis    Component Value Date/Time   COLORURINE YELLOW 04/07/2023 2000   APPEARANCEUR HAZY (A) 04/07/2023 2000   LABSPEC 1.009 04/07/2023 2000   PHURINE 6.0 04/07/2023 2000   GLUCOSEU NEGATIVE 04/07/2023 2000   HGBUR SMALL (A) 04/07/2023 2000   BILIRUBINUR NEGATIVE 04/07/2023 2000   KETONESUR NEGATIVE 04/07/2023 2000   PROTEINUR NEGATIVE 04/07/2023 2000   UROBILINOGEN 1.0 02/15/2015 0837   NITRITE NEGATIVE 04/07/2023 2000   LEUKOCYTESUR NEGATIVE 04/07/2023 2000   Sepsis Labs Recent Labs  Lab 04/11/23 0227 04/12/23 0249 04/13/23 0228 04/14/23 0254  WBC 3.0* 2.7* 2.9* 3.0*   Microbiology No results found for this or any previous visit (from the past 240 hour(s)).   Time coordinating discharge:  I have spent 35 minutes face to face with the patient and on the ward discussing the patients care, assessment, plan and disposition with other care givers. >50% of the time was devoted counseling the patient about the risks and benefits of treatment/Discharge disposition and coordinating care.   SIGNED:   Miguel Rota, MD  [facility] 04/14/2023, 11:34 AM   If 7PM-7AM, please contact night-coverage

## 2023-04-14 NOTE — TOC Transition Note (Signed)
Transition of Care [facility]) - CM/SW Discharge Note   Patient Details  Name: Whitney [name] MRN: [id number] Date of Birth: 11-15-1955  Transition of Care [facility] CM/SW Contact:  Lawerance Sabal, RN Phone Number: 04/14/2023, 11:45 AM   Clinical Narrative:     Rondel Jumbo of DC today, no new DME needs. Has DME at home including home oxygen per previous CM assessment.   Final next level of care: Home w Home Health Services Barriers to Discharge: Continued Medical Work up   Patient Goals and CMS Choice CMS Medicare.gov Compare Post Acute Care list provided to:: Patient Choice offered to / list presented to : Patient  Discharge Placement                         Discharge Plan and Services Additional resources added to the After Visit Summary for   In-house Referral: NA Discharge Planning Services: CM Consult Post Acute Care Choice: Home Health          DME Arranged: N/A DME Agency: NA       HH Arranged: PT, OT HH Agency: [facility] Date Continuing Care Hospital Agency Contacted: 04/08/23 Time HH Agency Contacted: 1428 Representative spoke with at [facility] Agency: Kandee Keen  Social Determinants of Health (SDOH) Interventions SDOH Screenings   Food Insecurity: No Food Insecurity (04/08/2023)  Housing: Low Risk  (04/08/2023)  Transportation Needs: No Transportation Needs (04/08/2023)  Utilities: Not At Risk (04/08/2023)  Depression (PHQ2-9): Low Risk  (09/20/2020)  Recent Concern: Depression (PHQ2-9) - Medium Risk (07/11/2020)  Tobacco Use: Low Risk  (04/07/2023)     Readmission Risk Interventions     No data to display

## 2023-04-14 NOTE — Progress Notes (Addendum)
Rounding Note    Patient Name: Whitney [name] Date of Encounter: 04/14/2023  [facility] Cardiologist: Charlton Haws, MD   Subjective   Currently at her new baseline.  Notes that legs are improved.   Inpatient Medications    Scheduled Meds:  apixaban  5 mg Oral BID   arformoterol  15 mcg Nebulization BID   And   umeclidinium bromide  1 puff Inhalation Daily   ezetimibe  10 mg Oral QHS   furosemide  40 mg Oral Daily   gabapentin  100 mg Oral BID   And   gabapentin  300 mg Oral QHS   insulin aspart  0-15 Units Subcutaneous TID WC   insulin aspart  0-5 Units Subcutaneous QHS   nadolol  40 mg Oral QHS   spironolactone  25 mg Oral Daily   Continuous Infusions:   PRN Meds: acetaminophen, guaiFENesin, ipratropium-albuterol, ondansetron **OR** ondansetron (ZOFRAN) IV, senna-docusate, traZODone   Vital Signs    Vitals:   04/14/23 0547 04/14/23 0800 04/14/23 0801 04/14/23 0819  BP: 130/72   118/60  Pulse: 61   70  Resp: 17   18  Temp: 98.2 F (36.8 C)   98.2 F (36.8 C)  TempSrc: Oral   Oral  SpO2: 93% 93% 94% 93%  Weight: 105.2 kg     Height:        Intake/Output Summary (Last 24 hours) at 04/14/2023 1038 Last data filed at 04/14/2023 0939 Gross per 24 hour  Intake 1020 ml  Output 1300 ml  Net -280 ml      04/14/2023    5:47 AM 04/13/2023    5:19 AM 04/12/2023    6:48 AM  Last 3 Weights  Weight (lbs) 231 lb 14.8 oz 229 lb 11.5 oz 230 lb 9.6 oz  Weight (kg) 105.2 kg 104.2 kg 104.6 kg      Telemetry    SR, mild bigeminy in the early AM-resolved. - Personally Reviewed  Physical Exam   GEN: No acute distress.  Delia in place Neck: mild JVD (TR) Cardiac: RRR, no rubs, or gallops. Blowing 3/6  systolic murmur.  Respiratory: Clear to auscultation bilaterally in upper fields but diminished at bilateral bases; known elevated R hemidiaphragm GI: Soft, nontender, non-distended  MS: no significant LE edema, compression stockings in place; No  deformity. Neuro:  Nonfocal  Psych: Normal affect   New pertinent results (labs, ECG, imaging, cardiac studies)    RHC 04/11/23:  1. Elevated right-sided filling pressure.  2. Normal PCWP.  3. Moderate pulmonary arterial hypertension.  PVR 4.11 WU.  With high cardiac output, also likely a component of high output pulmonary hypertension (in setting of cirrhosis).  4. High cardiac output.  5. No step-up in oxygen saturation.  6. Preserved PAPi.    Possible portopulmonary hypertension.  High cardiac output with cirrhosis may also contribute.  Needs some diuresis for RV failure and needs workup of pulmonary hypertension, would get V/Q scan while inpatient and make sure autoimmune serologies have been done.   Patient Profile     67 y.o. female notable PMH NASH cirrhosis followed by [facility], chronic thrombocytopenia, Sjogren's syndrome, AS s/p surgical bioprosthetic AVR in 2019, paroxysmal atrial fibrillation whom we are consulted on for edema/concern for heart failure   Assessment & Plan    Lower extremity edema [name] cirrhosis Chronic respiratory failure on home oxygen, Chronically elevated right hemidiaphragm Pulmonary hypertension -Her prior echo did show severely elevated PA pressures concerning for pulmonary hypertension. -  reviewed RHC today, discussed w/Dr Shirlee Latch. Wedge is normal, with elevated right sided pressures, high cardiac output. Concerning for portopulmonary hypertension. Will continue with diuresis for now.  She has Sjogren's which may contribute. -Prior pulmonary workup noted severe restrictive lung disease, probable obstructive disease without response to bronchodilators - VQ scan showed possible CTEPH.  Coronary CTA did not show acute PE, signs of pulmonary hypertension -normal wedge pressure excludes left sided heart failure as etiology -CO2 elevated 43; changing to oral lasix, bicarb is still up and she is not hypervolemic.  Will recommend Lasix 20 mg as needed for lower  extremity swelling. Continue spironolactone.  Acetazolamide contraindicated in cirrhosis; discussed with Dr Shirlee Latch and pharmacy team, ok to use short term with diuresis but long term use can affect ammonia metabolism, so contraindicated long term.  -abdominal ultrasound with steatosis/cirrhosis, patent portal vein with normal flow -weight 244-> 229->231   Aortic stenosis status post bioprosthetic AVR -Mildly elevated gradient, thought to be due to patient prosthesis mismatch.  Stable. asymptomatic   Hx of Paroxysmal atrial fibrillation Thrombocytopenia- in the setting of cirrhosis -CHA2DS2/VAS Stroke Risk Points=6  -Currently in sinus rhythm -Platelets have been <50 this admission - has been evaluated by heme, with steady platelets and low bleeding risk, can continue AC; plts 40s -restarting apixaban post cath. Has not had bleeding.  Will need follow up with advanced heart failure to discuss options for pulmonary hypertension management based on results of testing ordered this admission. She has follow up scheduled 12/17  Otherwise, there are no anticipated changes from cardiac perspective.  She can be discharged today  Time Spent Directly with Patient:  I have spent a total of 35 minutes with the patient reviewing hospital notes, telemetry, EKGs, labs and examining the patient as well as establishing an assessment and plan that was discussed personally with the patient.        Signed, Maisie Fus, MD  04/14/2023, 10:38 AM

## 2023-04-23 ENCOUNTER — Other Ambulatory Visit (HOSPITAL_COMMUNITY): Payer: Self-pay

## 2023-04-23 ENCOUNTER — Encounter (HOSPITAL_COMMUNITY): Payer: Self-pay | Admitting: Cardiology

## 2023-04-23 ENCOUNTER — Ambulatory Visit (HOSPITAL_COMMUNITY)
Admit: 2023-04-23 | Discharge: 2023-04-23 | Disposition: A | Discharge status: Home / Self Care | Payer: Medicare PPO | Source: Ambulatory Visit | Attending: Cardiology | Admitting: Cardiology

## 2023-04-23 VITALS — BP 122/80 | HR 62 | Wt 240.0 lb

## 2023-04-23 DIAGNOSIS — I509 Heart failure, unspecified: Secondary | ICD-10-CM | POA: Diagnosis not present

## 2023-04-23 DIAGNOSIS — Z953 Presence of xenogenic heart valve: Secondary | ICD-10-CM | POA: Insufficient documentation

## 2023-04-23 DIAGNOSIS — I080 Rheumatic disorders of both mitral and aortic valves: Secondary | ICD-10-CM | POA: Diagnosis not present

## 2023-04-23 DIAGNOSIS — K7460 Unspecified cirrhosis of liver: Secondary | ICD-10-CM | POA: Diagnosis not present

## 2023-04-23 DIAGNOSIS — Z7984 Long term (current) use of oral hypoglycemic drugs: Secondary | ICD-10-CM | POA: Diagnosis not present

## 2023-04-23 DIAGNOSIS — I272 Pulmonary hypertension, unspecified: Secondary | ICD-10-CM | POA: Diagnosis not present

## 2023-04-23 DIAGNOSIS — Z9981 Dependence on supplemental oxygen: Secondary | ICD-10-CM | POA: Insufficient documentation

## 2023-04-23 DIAGNOSIS — G4733 Obstructive sleep apnea (adult) (pediatric): Secondary | ICD-10-CM | POA: Diagnosis not present

## 2023-04-23 DIAGNOSIS — E8770 Fluid overload, unspecified: Secondary | ICD-10-CM | POA: Diagnosis not present

## 2023-04-23 DIAGNOSIS — Z79899 Other long term (current) drug therapy: Secondary | ICD-10-CM | POA: Insufficient documentation

## 2023-04-23 DIAGNOSIS — K760 Fatty (change of) liver, not elsewhere classified: Secondary | ICD-10-CM | POA: Insufficient documentation

## 2023-04-23 DIAGNOSIS — Z7901 Long term (current) use of anticoagulants: Secondary | ICD-10-CM | POA: Diagnosis not present

## 2023-04-23 DIAGNOSIS — J9611 Chronic respiratory failure with hypoxia: Secondary | ICD-10-CM | POA: Insufficient documentation

## 2023-04-23 DIAGNOSIS — D696 Thrombocytopenia, unspecified: Secondary | ICD-10-CM | POA: Insufficient documentation

## 2023-04-23 DIAGNOSIS — I350 Nonrheumatic aortic (valve) stenosis: Secondary | ICD-10-CM | POA: Diagnosis not present

## 2023-04-23 DIAGNOSIS — I480 Paroxysmal atrial fibrillation: Secondary | ICD-10-CM | POA: Diagnosis not present

## 2023-04-23 DIAGNOSIS — K766 Portal hypertension: Secondary | ICD-10-CM | POA: Insufficient documentation

## 2023-04-23 DIAGNOSIS — M3500 Sicca syndrome, unspecified: Secondary | ICD-10-CM | POA: Insufficient documentation

## 2023-04-23 LAB — COMPREHENSIVE METABOLIC PANEL
ALT: 14 U/L (ref 0–44)
AST: 29 U/L (ref 15–41)
Albumin: 2.5 g/dL — ABNORMAL LOW (ref 3.5–5.0)
Alkaline Phosphatase: 52 U/L (ref 38–126)
Anion gap: 4 — ABNORMAL LOW (ref 5–15)
BUN: 20 mg/dL (ref 8–23)
CO2: 37 mmol/L — ABNORMAL HIGH (ref 22–32)
Calcium: 8.8 mg/dL — ABNORMAL LOW (ref 8.9–10.3)
Chloride: 98 mmol/L (ref 98–111)
Creatinine, Ser: 0.88 mg/dL (ref 0.44–1.00)
GFR, Estimated: >60 mL/min (ref >60)
Glucose, Bld: 96 mg/dL (ref 70–99)
Potassium: 4.8 mmol/L (ref 3.5–5.1)
Sodium: 139 mmol/L (ref 135–145)
Total Bilirubin: 1.4 mg/dL — ABNORMAL HIGH (ref <1.2)
Total Protein: 6 g/dL — ABNORMAL LOW (ref 6.5–8.1)

## 2023-04-23 LAB — BRAIN NATRIURETIC PEPTIDE: B Natriuretic Peptide: 616 pg/mL — ABNORMAL HIGH (ref 0.0–100.0)

## 2023-04-23 LAB — MAGNESIUM: Magnesium: 1.9 mg/dL (ref 1.7–2.4)

## 2023-04-23 MED ORDER — POTASSIUM CHLORIDE CRYS ER 20 MEQ PO TBCR: 20.0000 meq | EXTENDED_RELEASE_TABLET | Freq: Every day | ORAL | 3 refills | Status: DC

## 2023-04-23 MED ORDER — FARXIGA 10 MG PO TABS: 10.0000 mg | ORAL_TABLET | Freq: Every day | ORAL | 3 refills | Status: DC

## 2023-04-23 MED ORDER — FUROSEMIDE 20 MG PO TABS: 40.0000 mg | ORAL_TABLET | Freq: Every day | ORAL | 3 refills | Status: DC

## 2023-04-23 NOTE — Progress Notes (Signed)
PCP: Whitney Floro, MD HF Cardiology: Dr. Shirlee [name]  67 y.o. with complicated history of Sjogren's syndrome, cirrhosis suspected to be due to NAFLD, rheumatic heart disease s/p bioprosthetic AVR, and pulmonary hypertension with RV failure was referred by Dr. Cristal [name] for evaluation of pulmonary hypertension/RV failure.  Patient was followed by Dr. Eldridge [name] in the past.  She was thought to have had rheumatic fever and developed severe aortic stenosis with mild mitral stenosis.  She had cath in 4/19 showing minimal CAD then had bioprosthetic AVR in 2019.  She also has been found to have restrictive lung disease though most recent high resolution CT of chest in 12/24 did not show a specific diagnosis (bronchial wall thickening, bibasilar atelectasis, chronic elevated right hemidiaphragm). She has been followed by rheumatology for Sjogren's syndrome and fibromyalgia.  She is followed by GI for cirrhosis thought to be due to NAFLD with varices and thrombocytopenia.   Echo in 11/24 showed EF 65-70%, moderate LVH, PASP 68 mmHg, mildly decreased RV systolic function, mild MR, mild mitral stenosis with mean gradient 4.5 mmHg, bioprosthetic aortic valve with mean gradient 24 mmHg and EOA 1.45 cm^2 (suspect patient-prosthesis mismatch), dilated IVC.    She developed worsening dyspnea and peripheral edema and was admitted with CHF in 12/24.  She was diuresed and was started on home oxygen at discharge.  RHC during this admission after diuresis showed elevated RA pressure with normal PCWP, moderate pulmonary arterial hypertension with relatively high cardiac output and PVR 4.1 WU suggesting a component of pulmonary arterial hypertension.   Since getting back home, her legs have started to swell and tighten again.  She has been taking Lasix 20 mg daily for the last few days.  Her abdomen feels bloated. She thinks she has gained 10 lbs since getting home.  She was short of breath walking into the office and is short  of breath walking around her house.  She gets short of breath dressing. No chest pain.  +Orthopnea.  No lightheadedness.   ECG (personally reviewed): NSR, right axis deviation, iRBBB.   Labs (12/24): K 4, creatinine 0.79, hgb 12.8, plts 47, AST and ALT normal, Lp(a) 82, SSA+, SSB+, ANA+, RF 17.9, anti-centromere negative  PMH: 1. OSA: CPAP 2. Cirrhosis: NAFLD with varices.  3. CVA: 12/23, AF-related. 4. Atrial fibrillation: Paroxysmal. 5. Sjogren's syndrome 6. Hyperlipidemia 7. LHC (4/19): minimal CAD. 8. Chronic elevation of the right hemidiaphragm.  9. Fibromyalgia 10. Rheumatic fever: Aortic stenosis with bioprosthetic AVR in 2019. Mild mitral stenosis.  - Echo (11/24) with EF 65-70%, moderate LVH, PASP 68 mmHg, mildly decreased RV systolic function, mild MR, mild mitral stenosis with mean gradient 4.5 mmHg, bioprosthetic aortic valve with mean gradient 24 mmHg and EOA 1.45 cm^2 (suspect patient-prosthesis mismatch), dilated IVC.   11. Type 2 diabetes 12. Thrombocytopenia: Suspect due to cirrhosis.  13. Sleeve gastrectomy 2015.  14. Pulmonary hypertension/RV failure: Possible portopulmonary hypertension.  - RHC (12/24): mean RA 12, PA 61/25 mean 40, mean PCWP 11, CI 3.34, PVR 4.1 WU, PAPi 3.6, SVC sat 71%, PA sat 70%.  - High resolution CT chest (12/24): bronchial wall thickening, bibasilar atelectasis, elevated right hemidiaphragm.  - V/Q scan in 12/24 was concerning for chronic PE but CTA chest showed no evidence for PE or ILD.   SH: Widow, nonsmoker, lives with son who has medical problems.  No ETOH.   Family History  Problem Relation Age of Onset   Alzheimer's disease Father    Hip fracture Father  Asthma Father    Heart disease Father    Heart attack Father    Hypertension Father    Rheum arthritis Mother    Heart disease Mother    Allergies Daughter    Asthma Paternal Grandmother    Asthma Daughter    Stroke Neg Hx    ROS: All systems reviewed and negative except  as per HPI.   Current Outpatient Medications  Medication Sig Dispense Refill   albuterol (PROVENTIL) (2.5 MG/3ML) 0.083% nebulizer solution Take 3 mLs (2.5 mg total) by nebulization every 6 (six) hours as needed for wheezing or shortness of breath. 75 mL 5   albuterol (VENTOLIN HFA) 108 (90 Base) MCG/ACT inhaler INHALE 2 PUFFS INTO THE LUNGS EVERY 4 HOURS AS NEEDED FOR WHEEZING OR SHORTNESS OF BREATH. 8.5 each 5   ELIQUIS 5 MG TABS tablet TAKE 1 TABLET BY MOUTH TWICE A DAY 90 tablet 2   ezetimibe (ZETIA) 10 MG tablet Take 10 mg by mouth at bedtime.   11   FARXIGA 10 MG TABS tablet Take 1 tablet (10 mg total) by mouth daily before breakfast. 90 tablet 3   gabapentin (NEURONTIN) 100 MG capsule Take 1 capsule (100 mg total) by mouth 2 (two) times daily at 10 am and 4 pm AND 3 capsules (300 mg total) at bedtime. 150 capsule 5   MAGNESIUM GLUCONATE PO Take 1 tablet by mouth daily.     nadolol (CORGARD) 40 MG tablet TAKE 1 TABLET BY MOUTH EVERY DAY 90 tablet 2   potassium chloride SA (KLOR-CON M) 20 MEQ tablet Take 1 tablet (20 mEq total) by mouth daily. 90 tablet 3   Semaglutide,0.25 or 0.5MG /DOS, (OZEMPIC, 0.25 OR 0.5 MG/DOSE,) 2 MG/3ML SOPN Inject 0.5 mg into the skin once a week.     spironolactone (ALDACTONE) 25 MG tablet Take 1 tablet (25 mg total) by mouth daily. 30 tablet 0   Tiotropium Bromide-Olodaterol (STIOLTO RESPIMAT) 2.5-2.5 MCG/ACT AERS INHALE 2 PUFFS INTO THE LUNGS DAILY. INHALE 2 PUFFS BY MOUTH INTO THE LUNGS DAILY 12 g 0   Cyanocobalamin (B-12 PO) Take 1 tablet by mouth in the morning. (Patient not taking: Reported on 04/23/2023)     furosemide (LASIX) 20 MG tablet Take 2 tablets (40 mg total) by mouth daily. 90 tablet 3   zinc gluconate 50 MG tablet Take 50 mg by mouth daily. (Patient not taking: Reported on 04/23/2023)     No current facility-administered medications for this encounter.   BP 122/80   Pulse 62   Wt 108.9 kg (240 lb)   SpO2 100% Comment: 3 l n/c  BMI 41.20  kg/m  General: NAD Neck: JVP 8-9 cm with HJR, no thyromegaly or thyroid nodule.  Lungs: Mild crackles at bases.  CV: Nondisplaced PMI.  Heart regular S1/S2, no S3/S4, 3/6 early SEM RUSB.  1+ edema 1/2 to knees bilaterally.  No carotid bruit.  Normal pedal pulses.  Abdomen: Soft, nontender, no hepatosplenomegaly, no distention.  Skin: Intact without lesions or rashes.  Neurologic: Alert and oriented x 3.  Psych: Normal affect. Extremities: No clubbing or cyanosis.  HEENT: Normal.   Assessment/Plan: 1. RV failure/cor pulmonale: Echo in 11/24 showed EF 65-70%, moderate LVH, PASP 68 mmHg, mildly decreased RV systolic function, mild MR, mild mitral stenosis with mean gradient 4.5 mmHg, bioprosthetic aortic valve with mean gradient 24 mmHg and EOA 1.45 cm^2 (suspect patient-prosthesis mismatch), dilated IVC.  RHC in 12/24 showed elevated right-sided filling pressures in the setting of moderate pulmonary  hypertension.  She is volume overloaded on exam with NYHA class III symptoms.  Weight is up 10 lbs.  - Increase Lasix to 40 qam/20 qpm x 4 days then 40 mg daily after that. BMET/BNP today and again in 1 week.  - Add KCl 20 daily.  - Add Farxiga 10 daily.  - Continue spironolactone 25 mg daily.  - She is on nadolol for portal hypertension/varices. Need to be careful with this with RV failure, would not increase.  2. Pulmonary hypertension: Echo in 11/24 with PASP 68 and mild RV dysfunction.  RHC In 12/24 with PA 61/25 mean 40, PCWP 11, PVR 4.11 WU with CI 3.34. Suspect mixed pulmonary hypertension due to high output in the setting of cirrhosis + possible portopulmonary hypertension (group 1 PH).  Sjogren's syndrome itself can be associated with group 1 pulmonary hypertension as well.  Patient has chronic restrictive lung disease with elevated right hemidiaphragm though HRCT chest did not show interstitial lung disease or emphysema, suspect not primarily group 3 PH.  Though V/Q scan in 12/24 was  concerning for chronic PEs, the CTA chest in 12/24 did not show any evidence for PE.  Overall, strong suspicion for group 1 PH from portopulmonary hypertension or Sjogren's syndrome.  - 6 minute walk next appointment after she is more diuresed.  - Once she is diuresed, will start on tadalafil.  Would be careful using ERA given cirrhosis diagnosis.  - Has OSA, should continue CPAP.  3. Chronic hypoxemic respiratory failure: She went home from the hospital in 12/24 on home oxygen.  HRCT chest showed elevated right hemidiaphragm with bronchial wall thickening and bibasilar scarring, but no ILD or emphysema.  Volume overload may play a role in her oxygen requirement.  - Will diurese as above then reassess need for oxygen.  4. Atrial fibrillation: She has been in NSR recently, no palpitations.  - Continue apixaban 5 mg bid.  5. Cirrhosis: Suspected due to NAFLD.  Had chronic thrombocytopenia and h/o varices.   - On nadolol for portal HTN/varices, would not increase with RV failure.  6. Rheumatic AS/MS: S/p bioprosthetic AVR.  Mitral stenosis was mild on last echo.  Echo in 11/24 showed elevated mean gradient across the aortic valve, 24 mmHg.  This was thought to be due to a degree of patient-prosthesis mismatch.   Followup with me in 2-3 weeks.  If she looks well-diuresed, will start tadalafil.   Whitney [name] 04/23/2023

## 2023-04-23 NOTE — Addendum Note (Signed)
Encounter addended by: Laurey Morale, MD on: 04/23/2023 11:37 PM  Actions taken: Level of Service modified

## 2023-04-23 NOTE — Patient Instructions (Signed)
START Farxiga 10 mg daily.  INCREASE Lasix to 40 mg in the morning and 20 mg in the evening for 4 days, then change to 40 mg daily.  Labs done today, your results will be available in MyChart, we will contact you for abnormal readings.  Repeat blood work in a week  Your physician recommends that you schedule a follow-up appointment as scheduled.  If you have any questions or concerns before your next appointment please send [place] a message through [place] or call our office at [phone].    TO LEAVE A MESSAGE FOR THE NURSE SELECT OPTION 2, PLEASE LEAVE A MESSAGE INCLUDING: YOUR NAME DATE OF BIRTH CALL BACK NUMBER REASON FOR CALL**this is important as we prioritize the call backs  YOU WILL RECEIVE A CALL BACK THE SAME DAY AS LONG AS YOU CALL BEFORE 4:00 PM  At the [facility], you and your health needs are our priority. As part of our continuing mission to provide you with exceptional heart care, we have created designated Provider Care Teams. These Care Teams include your primary Cardiologist (physician) and Advanced Practice Providers (APPs- Physician Assistants and Nurse Practitioners) who all work together to provide you with the care you need, when you need it.   You may see any of the following providers on your designated Care Team at your next follow up: Dr Arvilla Meres Dr Marca Ancona Dr. Dorthula Nettles Dr. Clearnce Hasten Amy Filbert Schilder, NP Robbie Lis, [place] [facility] [place], [place] Brynda Peon, NP [place] Lee, NP Karle Plumber, PharmD   Please be sure to bring in all your medications bottles to every appointment.    Thank you for choosing [facility]

## 2023-04-25 ENCOUNTER — Telehealth: Payer: Self-pay | Admitting: Pulmonary Disease

## 2023-04-25 NOTE — Telephone Encounter (Signed)
PT writes: Dr. Marca Ancona highly recommended I get a CPAP that supports oxygen. I I have become extremely ill with pulmonary hypertension. ??? See My Chart therefore may need a new script on my CPAP. The company [employer] is working with me. Just giving everyone the heads up.Whitney [name] 07/21/1955

## 2023-04-28 ENCOUNTER — Telehealth: Payer: Self-pay | Admitting: Cardiology

## 2023-04-28 ENCOUNTER — Encounter (HOSPITAL_COMMUNITY): Payer: Self-pay | Admitting: Cardiology

## 2023-04-28 NOTE — Telephone Encounter (Signed)
   Patient called answering service today for clarification/assistance with new Rx changes following her appointment with Dr. Shirlee Latch on 12/17. At that visit, patient noted to be volume up on exam and recommendation given to increase Lasix to 40mg  qam/20mg  qpm x4 days, followed by 40mg  daily afterwards. Patient was also given Rx for Farxiga 10mg  and Potassium . On the phone today, I had a confusing conversation with the patient. Patient reports issues obtaining new Marcelline Deist [facility] from [facility] and has not yet started. She has also only been taking Lasix 20mg  BID. She reports being told by the pharmacy that she needed the "generic" of this medication. I clarified with her that there is not currently a generic of this medication. While on the phone with me, patient then advised that she had received a text [facility] Marcelline Deist ready for pickup. Patient also did not realize that there was a new Rx for Lasix also waiting for pickup. I spent 10 minutes reviewing recent recommendations from Dr. Shirlee Latch with patient and she did appear to understand instructions, confirmed with readback. Patient to take [place] 10mg  daily, KCL daily, Lasix 40mg  qam/20mg  qpm x4 days then 40mg  daily.  Perlie Gold, PA-C

## 2023-04-29 NOTE — Telephone Encounter (Addendum)
Called patient.  Patient would like to get a CPAP that has oxygen bled in. Patient had split night sleep study 09/01/2019. Please advise.

## 2023-05-02 ENCOUNTER — Ambulatory Visit (HOSPITAL_COMMUNITY)
Admission: RE | Admit: 2023-05-02 | Discharge: 2023-05-02 | Disposition: A | Discharge status: Home / Self Care | Payer: Medicare PPO | Source: Ambulatory Visit | Attending: Internal Medicine | Admitting: Internal Medicine

## 2023-05-02 DIAGNOSIS — I272 Pulmonary hypertension, unspecified: Secondary | ICD-10-CM | POA: Diagnosis not present

## 2023-05-02 LAB — BASIC METABOLIC PANEL
Anion gap: 8 (ref 5–15)
BUN: 20 mg/dL (ref 8–23)
CO2: 41 mmol/L — ABNORMAL HIGH (ref 22–32)
Calcium: 8.8 mg/dL — ABNORMAL LOW (ref 8.9–10.3)
Chloride: 91 mmol/L — ABNORMAL LOW (ref 98–111)
Creatinine, Ser: 1.02 mg/dL — ABNORMAL HIGH (ref 0.44–1.00)
GFR, Estimated: 60 mL/min — ABNORMAL LOW (ref >60)
Glucose, Bld: 179 mg/dL — ABNORMAL HIGH (ref 70–99)
Potassium: 4.9 mmol/L (ref 3.5–5.1)
Sodium: 140 mmol/L (ref 135–145)

## 2023-05-02 NOTE — Progress Notes (Signed)
Carelink Summary Report / Loop Recorder 

## 2023-05-06 ENCOUNTER — Ambulatory Visit (INDEPENDENT_AMBULATORY_CARE_PROVIDER_SITE_OTHER): Payer: Medicare PPO

## 2023-05-06 DIAGNOSIS — I639 Cerebral infarction, unspecified: Secondary | ICD-10-CM

## 2023-05-06 LAB — CUP PACEART REMOTE DEVICE CHECK
Date Time Interrogation Session: 20241227231128
Implantable Pulse Generator Implant Date: 20231211

## 2023-05-07 ENCOUNTER — Encounter: Payer: Self-pay | Admitting: Physical Therapy

## 2023-05-07 NOTE — Therapy (Signed)
 PHYSICAL THERAPY DISCHARGE SUMMARY  Visits from Start of Care: 6  Current functional level related to goals / functional outcomes: Has not returned in >30 days, DC per [facility] policy    Remaining deficits: N/A    Education / Equipment: N/A    Patient agrees to discharge. Patient goals were not met. Patient is being discharged due to not returning since the last visit.  Josette Rough, PT, DPT 05/07/23 12:20 PM

## 2023-05-10 ENCOUNTER — Other Ambulatory Visit: Payer: Self-pay | Admitting: Pulmonary Disease

## 2023-05-10 ENCOUNTER — Telehealth: Payer: Self-pay | Admitting: Pulmonary Disease

## 2023-05-10 DIAGNOSIS — G4733 Obstructive sleep apnea (adult) (pediatric): Secondary | ICD-10-CM

## 2023-05-10 NOTE — Telephone Encounter (Signed)
 Patient called answering service requesting for CPAP supplies  I did reach out to [employer]  They need a faxed request for CPAP supplies  Will have [facility] order for CPAP supplies to go to [employer] [facility]    [facility] order for CPAP supplies, needs post to connect oxygen  to CPAP.  [employer] needs faxed order

## 2023-05-10 NOTE — Telephone Encounter (Signed)
 Cpap orders sent. Patient informed. Nothing further needed.

## 2023-05-13 ENCOUNTER — Ambulatory Visit: Payer: Medicare PPO | Attending: Cardiology | Admitting: Pharmacist Clinician (PhC)/ Clinical Pharmacy Specialist

## 2023-05-13 ENCOUNTER — Encounter: Payer: Self-pay | Admitting: Pharmacist Clinician (PhC)/ Clinical Pharmacy Specialist

## 2023-05-13 DIAGNOSIS — E782 Mixed hyperlipidemia: Secondary | ICD-10-CM | POA: Diagnosis not present

## 2023-05-13 NOTE — Assessment & Plan Note (Signed)
 Assessment: Patient with CVA not at LDL goal of < 55 Most recent LDL 100 on 06/2022 Not able to tolerate statins secondary to cirrhosis/NASH Will get updated labs this week Reviewed options for lowering LDL cholesterol, including ezetimibe , PCSK-9 inhibitors, bempedoic acid and inclisiran.  Discussed mechanisms of action, dosing, side effects, potential decreases in LDL cholesterol and costs.  Also reviewed potential options for patient assistance.  Plan: Patient agreeable to starting Repatha  140 mg q14d Will do PA once current lab received Repeat labs 2 months after starting medications Lipid Liver function If needed will give information on [employer] grant - will sign patient up when PA approved Marital status Income < $72,000 (single) or < $102,000 (married)

## 2023-05-13 NOTE — Progress Notes (Signed)
 Office Visit    Patient Name: Whitney [name] Date of Encounter: 05/13/2023  Primary Care Provider:  Okey Whitney Redbird, MD Primary Cardiologist:  Maude Emmer, MD  Chief Complaint    Hyperlipidemia   Significant Past Medical History   CVA 12/23, no residual issues, had ED visit 1/24 for slurred speech, poss TIA; loop recorder with recent AF dx  DM2 12/24 A1c 5.8, on dapagliflozin, semaglutide  AVR Replaced 2019  OSA On CPAP  Sleeve gastrectomy 2015, gained some of weight back during Covid     Allergies  Allergen Reactions   Prednisone      Other Reaction(s): delerium   Doxycycline Hives and Rash   Naproxen Rash and Hives    History of Present Illness    Whitney [name] is a 68 y.o. female patient of Dr Rolan, in the office today to discuss options for cholesterol management.    Insurance Carrier:   [employer] Employees retired  LDL Cholesterol goal: LDL < 55  Pharmacy:  Physiological Scientist (uses Clinical Cytogeneticist)  Current Medications:   ezetimibe  10 mg qhs  Previously tried:  statins in the 90's, can't recall names, was told to d/c because of NASH, cirrhosis  Family Hx: father had MI HLD, smoker; mother had CHF, NASH, brother with cholesterol issues, sister unknown, son with 3 MI , first at 67, now doing well;   Social Hx: Tobacco: no Alcohol : no   Diet:    more home cooked meals, although more processed meals in the past month; fresh f/v regularly, has trouble getting enough protein  Exercise: just finished PT  Adherence Assessment  Do you ever forget to take your medication? [] Yes [x] No  Do you ever skip doses due to side effects? [] Yes [x] No  Do you have trouble affording your medicines? [] Yes [x] No  Are you ever unable to pick up your medication due to transportation difficulties? [] Yes [x] No  Do you ever stop taking your medications because you don't believe they are helping? [] Yes [] No  Do you check your weight daily? [] Yes [] No   Adherence strategy: 7  day pill minder  Accessory Clinical Findings   2/24 LDL 100  Lab Results  Component Value Date   CHOL 140 04/14/2022   HDL 29 (L) 04/14/2022   LDLCALC 97 04/14/2022   TRIG 70 04/14/2022   CHOLHDL 4.8 04/14/2022    Lipoprotein (a)  Date/Time Value Ref Range Status  04/12/2023 02:49 AM 82.1 (H) <75.0 nmol/L Final    Comment:    (NOTE) Note:  Values greater than or equal to 75.0 nmol/L may       indicate an independent risk factor for CHD,       but must be evaluated with caution when applied       to non-Caucasian populations due to the       influence of genetic factors on Lp(a) across       ethnicities. Performed At: [facility] [address] [place], [place] [id number] Jennette Shorter MD Ey:[id number]     Lab Results  Component Value Date   ALT 14 04/23/2023   AST 29 04/23/2023   ALKPHOS 52 04/23/2023   BILITOT 1.4 (H) 04/23/2023   Lab Results  Component Value Date   CREATININE 1.02 (H) 05/02/2023   BUN 20 05/02/2023   NA 140 05/02/2023   K 4.9 05/02/2023   CL 91 (L) 05/02/2023   CO2 41 (H) 05/02/2023   Lab Results  Component Value Date  HGBA1C 5.8 (H) 04/08/2023    Home Medications    Current Outpatient Medications  Medication Sig Dispense Refill   albuterol  (PROVENTIL ) (2.5 MG/3ML) 0.083% nebulizer solution Take 3 mLs (2.5 mg total) by nebulization every 6 (six) hours as needed for wheezing or shortness of breath. 75 mL 5   albuterol  (VENTOLIN  HFA) 108 (90 Base) MCG/ACT inhaler INHALE 2 PUFFS INTO THE LUNGS EVERY 4 HOURS AS NEEDED FOR WHEEZING OR SHORTNESS OF BREATH. 8.5 each 5   Cyanocobalamin  (B-12 PO) Take 1 tablet by mouth in the morning. (Patient not taking: Reported on 04/23/2023)     ELIQUIS  5 MG TABS tablet TAKE 1 TABLET BY MOUTH TWICE A DAY 90 tablet 2   ezetimibe  (ZETIA ) 10 MG tablet Take 10 mg by mouth at bedtime.   11   FARXIGA  10 MG TABS tablet Take 1 tablet (10 mg total) by mouth daily before breakfast. 90 tablet 3   furosemide   (LASIX ) 20 MG tablet Take 2 tablets (40 mg total) by mouth daily. 90 tablet 3   gabapentin  (NEURONTIN ) 100 MG capsule Take 1 capsule (100 mg total) by mouth 2 (two) times daily at 10 am and 4 pm AND 3 capsules (300 mg total) at bedtime. 150 capsule 5   MAGNESIUM  GLUCONATE PO Take 1 tablet by mouth daily.     nadolol  (CORGARD ) 40 MG tablet TAKE 1 TABLET BY MOUTH EVERY DAY 90 tablet 2   potassium chloride  SA (KLOR-CON  M) 20 MEQ tablet Take 1 tablet (20 mEq total) by mouth daily. 90 tablet 3   Semaglutide,0.25 or 0.5MG /DOS, (OZEMPIC, 0.25 OR 0.5 MG/DOSE,) 2 MG/3ML SOPN Inject 0.5 mg into the skin once a week.     spironolactone  (ALDACTONE ) 25 MG tablet Take 1 tablet (25 mg total) by mouth daily. 30 tablet 0   Tiotropium Bromide-Olodaterol (STIOLTO RESPIMAT ) 2.5-2.5 MCG/ACT AERS INHALE 2 PUFFS INTO THE LUNGS DAILY. INHALE 2 PUFFS BY MOUTH INTO THE LUNGS DAILY 12 g 0   zinc  gluconate 50 MG tablet Take 50 mg by mouth daily. (Patient not taking: Reported on 04/23/2023)     No current facility-administered medications for this visit.     Assessment & Plan    Hyperlipidemia Assessment: Patient with CVA not at LDL goal of < 55 Most recent LDL 100 on 06/2022 Not able to tolerate statins secondary to cirrhosis/NASH Will get updated labs this week Reviewed options for lowering LDL cholesterol, including ezetimibe , PCSK-9 inhibitors, bempedoic acid and inclisiran.  Discussed mechanisms of action, dosing, side effects, potential decreases in LDL cholesterol and costs.  Also reviewed potential options for patient assistance.  Plan: Patient agreeable to starting Repatha  140 mg q14d Will do PA once current lab received Repeat labs 2 months after starting medications Lipid Liver function If needed will give information on [employer] grant - will sign patient up when PA approved Marital status Income < $72,000 (single) or < $102,000 (married)   Allean Mink, PharmD CPP [facility] [address]  [place], [place] [address] [phone]  05/13/2023, 4:44 PM

## 2023-05-13 NOTE — Patient Instructions (Addendum)
 Your Results:             Your most recent labs Goal  Total Cholesterol last < 200  Triglycerides drawn < 150  HDL (happy/good cholesterol) Feb 2022 > 40  LDL (lousy/bad cholesterol  < 55   Medication changes:  We will start the process to get repatha  covered by your insurance.  Once the prior authorization is complete, I will call/send a MyChart message to let you know and confirm pharmacy information.   You will take one injection every 14 days  Lab orders:  We want to repeat labs after 2-3 months.  We will send you a lab order to remind you once we get closer to that time.      Thank you for choosing [facility]

## 2023-05-15 ENCOUNTER — Telehealth: Payer: Self-pay | Admitting: Emergency Medicine

## 2023-05-15 ENCOUNTER — Ambulatory Visit: Payer: Medicare PPO | Admitting: Cardiovascular Disease

## 2023-05-15 DIAGNOSIS — J441 Chronic obstructive pulmonary disease with (acute) exacerbation: Secondary | ICD-10-CM | POA: Diagnosis not present

## 2023-05-15 DIAGNOSIS — J9601 Acute respiratory failure with hypoxia: Secondary | ICD-10-CM | POA: Diagnosis not present

## 2023-05-15 NOTE — Telephone Encounter (Signed)
  PT is calling about an upcoming appt she has with Dr. Shelah. She said she was to have some BW done but I see nothing in activity screen. She said Dr. Shelah would talk to [facility] about it but she knows nothing and has not see this PT. Please call PT @ [phone]

## 2023-05-18 NOTE — Telephone Encounter (Signed)
 Mychart sent to pt.

## 2023-05-20 ENCOUNTER — Encounter (HOSPITAL_COMMUNITY): Payer: Self-pay | Admitting: Cardiology

## 2023-05-20 DIAGNOSIS — E782 Mixed hyperlipidemia: Secondary | ICD-10-CM | POA: Diagnosis not present

## 2023-05-20 NOTE — Telephone Encounter (Signed)
 Dr. Delton Coombes, please see mychart messages and advise if pt does need any bloodwork to be done prior to her upcoming appt with you.

## 2023-05-20 NOTE — Telephone Encounter (Signed)
 I reviewed chart, don't think I need to order any labs right now

## 2023-05-21 ENCOUNTER — Telehealth: Payer: Self-pay | Admitting: Pharmacist Clinician (PhC)/ Clinical Pharmacy Specialist

## 2023-05-21 LAB — HEPATIC FUNCTION PANEL
ALT: 12 [IU]/L (ref 0–32)
AST: 28 [IU]/L (ref 0–40)
Albumin: 3.1 g/dL — ABNORMAL LOW (ref 3.9–4.9)
Alkaline Phosphatase: 62 [IU]/L (ref 44–121)
Bilirubin Total: 1.2 mg/dL (ref 0.0–1.2)
Bilirubin, Direct: 0.46 mg/dL — ABNORMAL HIGH (ref 0.00–0.40)
Total Protein: 6.1 g/dL (ref 6.0–8.5)

## 2023-05-21 LAB — LIPID PANEL
Chol/HDL Ratio: 4.5 {ratio} — ABNORMAL HIGH (ref 0.0–4.4)
Cholesterol, Total: 176 mg/dL (ref 100–199)
HDL: 39 mg/dL — ABNORMAL LOW (ref >39)
LDL Chol Calc (NIH): 118 mg/dL — ABNORMAL HIGH (ref 0–99)
Triglycerides: 106 mg/dL (ref 0–149)
VLDL Cholesterol Cal: 19 mg/dL (ref 5–40)

## 2023-05-21 NOTE — Telephone Encounter (Signed)
 Please do PA for Repatha

## 2023-05-22 ENCOUNTER — Telehealth: Payer: Self-pay | Admitting: Pharmacy Technician

## 2023-05-22 ENCOUNTER — Other Ambulatory Visit (HOSPITAL_COMMUNITY): Payer: Self-pay

## 2023-05-22 NOTE — Telephone Encounter (Signed)
 Pharmacy Patient Advocate Encounter   Received notification from Pt Calls Messages that prior authorization for repatha  is required/requested.   Insurance verification completed.   The patient is insured through [employer] .   Per test claim: PA required; PA submitted to above mentioned insurance via CoverMyMeds Key/confirmation #/EOC [id number] Status is pending

## 2023-05-22 NOTE — Telephone Encounter (Signed)
 Pharmacy Patient Advocate Encounter  Received notification from [employer] that Prior Authorization for repatha  has been APPROVED from 05/08/23 to 05/05/24. Ran test claim, Copay is $40.00 one month. This test claim was processed through [facility]- copay amounts may vary at other pharmacies due to pharmacy/plan contracts, or as the patient moves through the different stages of their insurance plan.   PA #/Case ID/Reference #: [id number]

## 2023-05-23 MED ORDER — REPATHA SURECLICK 140 MG/ML ~~LOC~~ SOAJ: 140.0000 mg | SUBCUTANEOUS | 3 refills | Status: DC

## 2023-05-23 NOTE — Addendum Note (Signed)
Addended by: Rosalee Kaufman on: 05/23/2023 08:13 AM   Modules accepted: Orders

## 2023-05-27 ENCOUNTER — Encounter: Payer: Self-pay | Admitting: Pulmonary Disease

## 2023-05-27 ENCOUNTER — Inpatient Hospital Stay (HOSPITAL_COMMUNITY): Admission: RE | Admit: 2023-05-27 | Payer: Medicare PPO | Source: Ambulatory Visit | Admitting: Cardiology

## 2023-05-27 DIAGNOSIS — G4733 Obstructive sleep apnea (adult) (pediatric): Secondary | ICD-10-CM | POA: Diagnosis not present

## 2023-05-30 ENCOUNTER — Other Ambulatory Visit: Payer: Self-pay

## 2023-05-30 ENCOUNTER — Encounter: Payer: Self-pay | Admitting: Emergency Medicine

## 2023-05-30 ENCOUNTER — Ambulatory Visit: Payer: Medicare PPO | Admitting: Emergency Medicine

## 2023-05-30 VITALS — BP 119/84 | HR 70 | Ht 64.0 in | Wt 222.2 lb

## 2023-05-30 DIAGNOSIS — G4733 Obstructive sleep apnea (adult) (pediatric): Secondary | ICD-10-CM | POA: Diagnosis not present

## 2023-05-30 DIAGNOSIS — J449 Chronic obstructive pulmonary disease, unspecified: Secondary | ICD-10-CM | POA: Diagnosis not present

## 2023-05-30 DIAGNOSIS — J441 Chronic obstructive pulmonary disease with (acute) exacerbation: Secondary | ICD-10-CM

## 2023-05-30 DIAGNOSIS — I272 Pulmonary hypertension, unspecified: Secondary | ICD-10-CM

## 2023-05-30 DIAGNOSIS — J9611 Chronic respiratory failure with hypoxia: Secondary | ICD-10-CM

## 2023-05-30 NOTE — Telephone Encounter (Signed)
I received a message from [place] with [employer], Whitney [name], it looks like she has a Bipap received on 10-27-19.  it looks like she called in on 05/24/23 and spoke to re-supply team regarding the need for an o2 Climate line her pap unit. that order was placed on 05/27/23 and shipped.

## 2023-05-30 NOTE — Telephone Encounter (Signed)
Can we check into this?

## 2023-05-30 NOTE — Telephone Encounter (Signed)
I have sent an urgent message to [employer] to find out what the issue is

## 2023-05-30 NOTE — Assessment & Plan Note (Signed)
Question whether this is due to hepatic disease and hepatopulmonary shunting.  Consider also autoimmune disease given her Sjogren's.  She does have underlying valvular disease which could be a contributor.  Her hypoxemia is new and significant.  She has follow-up with cardiology, Dr. Shirlee Latch.  Question whether she may be a candidate for targeted PAH therapy.

## 2023-05-30 NOTE — Assessment & Plan Note (Signed)
Need to get her back on her CPAP.  Currently she is using supplemental oxygen at night.  We are working on getting appropriate connectors and tubing so her oxygen can be bled in to her CPAP.

## 2023-05-30 NOTE — Patient Instructions (Signed)
We will perform a walking oximetry today to try to qualify you for portable oxygen concentrator. We will message [employer] to try to ensure that you have the appropriate adapter and tubing to bleed your oxygen into your CPAP.  I want to get back on your CPAP as soon as possible. Continue your Stiolto 2 puffs once daily. Keep DuoNeb available to use up to every 6 hours if needed for shortness of breath, chest tightness, wheezing. Follow with Dr. Shirlee Latch in cardiology as planned Follow with B. Clent Ridges or Dr. Delton Coombes in 3 months

## 2023-05-30 NOTE — Assessment & Plan Note (Signed)
Continue your Stiolto 2 puffs once daily. Keep DuoNeb available to use up to every 6 hours if needed for shortness of breath, chest tightness, wheezing. Follow with B. Clent Ridges or Dr. Delton Coombes in 3 months

## 2023-05-30 NOTE — Progress Notes (Signed)
   Subjective:    Patient ID: Whitney [name], female    DOB: [birth year], 68 y.o.   MRN: [id number]  HPI   ROV 05/30/2023 --68 year old woman with a history of multifactorial restrictive lung disease (elevated hemidiaphragm, bibasilar scarring).  She also has OSA on CPAP, mild obstructive lung disease on pulmonary function testing, NASH cirrhosis, aortic stenosis with an AVR, GERD, Sjogren's.  She has chronic cough in the setting of her GERD and Sjogren's.  She was noted to have hypoxemia 03/12/2023 and started on supplemental oxygen.  Good compliance with her CPAP She has been managed with Stiolto, uses DuoNeb as needed. She believes that she is getting good benefit from this.   CT-PA 04/13/2023 reviewed by me showed no evidence of PE.  No mediastinal or hilar adenopathy.  Some scattered bronchial wall thickening bilaterally and some bibasilar scarring/atelectasis unchanged from priors.  There was a stable trace left pleural effusion  High Res CT chest 04/12/23 reviewed by me shows mild tracheobronchomalacia on expiratory images without significant air trapping.  There is diffuse bilateral bronchial wall thickening and basilar atelectasis and scar more on the right than on the left with the elevated right hemidiaphragm.  Trace left pleural effusion.  No overt honeycomb change    Review of Systems As per HPI     Objective:   Physical Exam Vitals:   05/30/23 1457  BP: 119/84  Pulse: 70  SpO2: 99%  Weight: 222 lb 3.2 oz (100.8 kg)  Height: 5\' 4"  (1.626 m)    Gen: Pleasant, overwt woman, in no distress,  somewhat depressed affect  ENT: No lesions,  mouth clear,  oropharynx clear, no postnasal drip  Neck: No JVD, no stridor  Lungs: No use of accessory muscles, clear, better BS on the L than on the R  Cardiovascular: RRR, late systolic M with intact S1 and S2  Musculoskeletal: No deformities, no cyanosis or clubbing  Neuro: alert, awake, non focal  Skin: Warm, no lesions or  rash      Assessment & Plan:  Pulmonary hypertension, unspecified (HCC) Question whether this is due to hepatic disease and hepatopulmonary shunting.  Consider also autoimmune disease given her Sjogren's.  She does have underlying valvular disease which could be a contributor.  Her hypoxemia is new and significant.  She has follow-up with cardiology, Whitney [name].  Question whether she may be a candidate for targeted PAH therapy.  OSA (obstructive sleep apnea) Need to get her back on her CPAP.  Currently she is using supplemental oxygen at night.  We are working on getting appropriate connectors and tubing so her oxygen can be bled in to her CPAP.  COPD (chronic obstructive pulmonary disease) (HCC) Continue your Stiolto 2 puffs once daily. Keep DuoNeb available to use up to every 6 hours if needed for shortness of breath, chest tightness, wheezing. Follow with Whitney [name] or Whitney [name] in 3 months  Chronic hypoxic respiratory failure [facility]) We will perform walking oximetry today to qualify her for POC.  Time spent 40 minutes   Levy Pupa, MD, PhD 05/30/2023, 7:53 PM [facility] [phone] or if no answer [phone]

## 2023-05-30 NOTE — Assessment & Plan Note (Signed)
We will perform walking oximetry today to qualify her for POC.

## 2023-05-31 DIAGNOSIS — J441 Chronic obstructive pulmonary disease with (acute) exacerbation: Secondary | ICD-10-CM | POA: Diagnosis not present

## 2023-06-05 ENCOUNTER — Encounter: Payer: Self-pay | Admitting: Internal Medicine

## 2023-06-07 NOTE — Telephone Encounter (Signed)
Message sent to [facility] with [employer] to see if he can help.

## 2023-06-07 NOTE — Telephone Encounter (Signed)
 Whitney [name]

## 2023-06-07 NOTE — Telephone Encounter (Signed)
Message sent to [employer] 

## 2023-06-10 ENCOUNTER — Ambulatory Visit: Payer: Medicare PPO

## 2023-06-10 DIAGNOSIS — I639 Cerebral infarction, unspecified: Secondary | ICD-10-CM | POA: Diagnosis not present

## 2023-06-10 LAB — CUP PACEART REMOTE DEVICE CHECK
Date Time Interrogation Session: 20250131230725
Implantable Pulse Generator Implant Date: 20231211

## 2023-06-15 DIAGNOSIS — J441 Chronic obstructive pulmonary disease with (acute) exacerbation: Secondary | ICD-10-CM | POA: Diagnosis not present

## 2023-06-15 DIAGNOSIS — J9601 Acute respiratory failure with hypoxia: Secondary | ICD-10-CM | POA: Diagnosis not present

## 2023-06-25 ENCOUNTER — Ambulatory Visit: Payer: Medicare PPO | Admitting: Neurology

## 2023-06-25 ENCOUNTER — Encounter: Payer: Self-pay | Admitting: Neurology

## 2023-06-25 VITALS — BP 131/84 | HR 87 | Ht 64.0 in | Wt 218.0 lb

## 2023-06-25 DIAGNOSIS — R202 Paresthesia of skin: Secondary | ICD-10-CM | POA: Diagnosis not present

## 2023-06-25 DIAGNOSIS — G629 Polyneuropathy, unspecified: Secondary | ICD-10-CM

## 2023-06-25 DIAGNOSIS — Z8673 Personal history of transient ischemic attack (TIA), and cerebral infarction without residual deficits: Secondary | ICD-10-CM | POA: Diagnosis not present

## 2023-06-25 NOTE — Patient Instructions (Addendum)
I had a long discussion with the patient regarding her gait and balance difficulties and neuropathic pain which is likely a combination of diabetic neuropathy as well as  her recent strokes and answered questions.  I recommended continue gabapentin in the current dose of 100 mg twice daily and 300 mg at night to help with neuropathic pain.  I advised her to get up slowly and avoid quick and sudden movements.  I encouraged her to use a walker or cane while walking outdoors and long distances and we discussed fall safety precautions.  Continue Eliquis for stroke prevention for A-fib and maintain aggressive risk factor modification with strict control of hypertension with blood pressure goal below 130/90, lipids with LDL cholesterol goal below 70 mg percent and diabetes with insulin see goal below 6 point percent.  .  Check screening carotid ultrasound..  She will return for follow-up in the future in 6 months.  Whitney [name] nurse practitioner call earlier if necessary. Fall Prevention in the Home, Adult Falls can cause injuries and affect people of all ages. There are many simple things that you can do to make your home safe and to help prevent falls. If you need it, ask for help making these changes. What actions can I take to prevent falls? General information Use good lighting in all rooms. Make sure to: Replace any light bulbs that burn out. Turn on lights if it is dark and use night-lights. Keep items that you use often in easy-to-reach places. Lower the shelves around your home if needed. Move furniture so that there are clear paths around it. Do not keep throw rugs or other things on the floor that can make you trip. If any of your floors are uneven, fix them. Add color or contrast paint or tape to clearly mark and help you see: Grab bars or handrails. First and last steps of staircases. Where the edge of each step is. If you use a ladder or stepladder: Make sure that it is fully opened. Do not  climb a closed ladder. Make sure the sides of the ladder are locked in place. Have someone hold the ladder while you use it. Know where your pets are as you move through your home. What can I do in the bathroom?     Keep the floor dry. Clean up any water that is on the floor right away. Remove soap buildup in the bathtub or shower. Buildup makes bathtubs and showers slippery. Use non-skid mats or decals on the floor of the bathtub or shower. Attach bath mats securely with double-sided, non-slip rug tape. If you need to sit down while you are in the shower, use a non-slip stool. Install grab bars by the toilet and in the bathtub and shower. Do not use towel bars as grab bars. What can I do in the bedroom? Make sure that you have a light by your bed that is easy to reach. Do not use any sheets or blankets on your bed that hang to the floor. Have a firm bench or chair with side arms that you can use for support when you get dressed. What can I do in the kitchen? Clean up any spills right away. If you need to reach something above you, use a sturdy step stool that has a grab bar. Keep electrical cables out of the way. Do not use floor polish or wax that makes floors slippery. What can I do with my stairs? Do not leave anything on the stairs. Make  sure that you have a light switch at the top and the bottom of the stairs. Have them installed if you do not have them. Make sure that there are handrails on both sides of the stairs. Fix handrails that are broken or loose. Make sure that handrails are as long as the staircases. Install non-slip stair treads on all stairs in your home if they do not have carpet. Avoid having throw rugs at the top or bottom of stairs, or secure the rugs with carpet tape to prevent them from moving. Choose a carpet design that does not hide the edge of steps on the stairs. Make sure that carpet is firmly attached to the stairs. Fix any carpet that is loose or  worn. What can I do on the outside of my home? Use bright outdoor lighting. Repair the edges of walkways and driveways and fix any cracks. Clear paths of anything that can make you trip, such as tools or rocks. Add color or contrast paint or tape to clearly mark and help you see high doorway thresholds. Trim any bushes or trees on the main path into your home. Check that handrails are securely fastened and in good repair. Both sides of all steps should have handrails. Install guardrails along the edges of any raised decks or porches. Have leaves, snow, and ice cleared regularly. Use sand, salt, or ice melt on walkways during winter months if you live where there is ice and snow. In the garage, clean up any spills right away, including grease or oil spills. What other actions can I take? Review your medicines with your health care provider. Some medicines can make you confused or feel dizzy. This can increase your chance of falling. Wear closed-toe shoes that fit well and support your feet. Wear shoes that have rubber soles and low heels. Use a cane, walker, scooter, or crutches that help you move around if needed. Talk with your provider about other ways that you can decrease your risk of falls. This may include seeing a physical therapist to learn to do exercises to improve movement and strength. Where to find more information Centers for Disease Control and Prevention, [employer]: TonerPromos.no [employer] on Aging: BaseRingTones.pl National Institute on Aging: BaseRingTones.pl Contact a health care provider if: You are afraid of falling at home. You feel weak, drowsy, or dizzy at home. You fall at home. Get help right away if you: Lose consciousness or have trouble moving after a fall. Have a fall that causes a head injury. These symptoms may be an emergency. Get help right away. Call 911. Do not wait to see if the symptoms will go away. Do not drive yourself to the hospital. This information is  not intended to replace advice given to you by your health care provider. Make sure you discuss any questions you have with your health care provider. Document Revised: 12/25/2021 Document Reviewed: 12/25/2021 Elsevier Patient Education  2024 [employer].

## 2023-06-25 NOTE — Progress Notes (Signed)
[facility] [address] [place].  [address] [phone]       HOSPITAL FOLLOW UP NOTE  [name] Date of Birth:  1955-12-22 Medical Record Number:  [id number]   Reason for Referral:  hospital stroke follow up    SUBJECTIVE:   CHIEF COMPLAINT:  Chief Complaint  Patient presents with   Altered Mental Status    Rm 3 alone  Pt is well, reports she hasn't had any major concerns regarding her cognition currently. She is doing well.     HPI:   Prior visit 05/17/2022 Shanda Bumps, NP ) [name] is a 68 y.o. female with history of diabetes, cirrhosis, Sjogren's disease, OSA, remote rheumatic fever with AVR who presented to ED on 04/13/2022 with episode of imbalance, diplopia, nausea and numbness.  Stroke workup revealed scattered punctate infarcts of right cerebellum, midbrain, left occipital and right parietal of unclear etiology.  Loop recorder placed to evaluate for A-fib.  CTA head/neck bilateral fetal PCAs.  EF 60 to 65%.  LE Doppler negative for DVT.  LDL 97.  A1c 5.4.  Recommended DAPT for 3 weeks and aspirin alone as well as continuation of Zetia, not a statin candidate due to cirrhosis.  Therapy recommended home health PT.   Today, 05/17/2022, patient is being seen for initial hospital follow-up unaccompanied.  Reports doing well since discharge, will have occasional dizziness/unsteadiness sensation but has been gradually improving.  Worked with [facility] PT for balance which did help, has since completed.  Denies any recurrent diplopia or any other new stroke/TIA symptoms.  She does report chronic neuropathy which has been gradually progressive denies any worsening poststroke.  Loop recorder showed evidence of A-fib, DAPT discontinued and started on Eliquis 5 mg BID by cardiology with a CHA2DS2-VASc score of 6.  Reports doing well on Eliquis without any side effects.  Also reviewed on Zetia.  Blood pressure well-controlled.  Update 09/03/2022 : She  returns for follow-up after last visit with Shanda Bumps nurse practitioner 3 months ago.  Patient has noticed that her gait and balance has been slightly off since her stroke.  Can still walk fine but she has trouble walking and feels he is flat-footed and has to walk slippers and shoes.  Has more trouble walking barefoot.  He does complain of tingling numbness and pain.  As well.  He has nocturnal leg cramps which are bothersome.  She does take magnesium 400 mg daily if it helps.  She is not taking Lexapro . she is looking after her son was on November 2023.  She has not tried neuropathic pain medications like gabapentin, Topamax and Lyrica.  He has not had any falls or injuries.  She has not needed a cane device to walk.  Had mild neuropathy symptoms for diagnosis of diabetes in 2001.  Sugars have been quite well-controlled of late.  She remains on Eliquis for stroke prevention and is tolerating it well with only minor bruising and bleeding.  Her blood pressure is under good control.  Last ER visit on 05/23/2022 when she was evaluated for transient episode of aphasia which resolved by the time she was seen.  CT head and MRI scan performed that day showed no acute abnormality.  Basic lab work was unremarkable.  Patient felt her symptoms were related to low blood sugar but her granddaughter insisted getting her evaluated.  She has done well since then without any new neurovascular complaints.  Update 01/31/2023 JM: Patient returns for sooner  follow-up visit due to LE worsening neuropathy.  She also mentions tingling in fingertips with discoloration, has a frostbite/cold burning sensation, affects all fingertips but more prominent in both fingers. She is concerned regarding circulation.  Was prescribed gabapentin 100mg  TID at prior visit, has only been taking nightly up until 1 month ago as she was not aware of this dosage accommodation, now taking TID and tolerating well but denies much benefit. Pain worse at night,  unable to go barefoot, wears socks as feet constantly feel cold. Continues to have gait impairment with imbalance present since prior stroke, thankfully no recent falls, no use of AD. Plans on starting PT next week for left hip pain, followed by orthopedics.  No new stroke/TIA symptoms.  Compliant on medications.  Routinely follows with PCP Dr. Tenny Craw as well as cardiology, pulmonology, GI (NASH cirrhosis) and rheumatology.  Update 06/25/2023 : She returns for follow-up after last visit with Shanda Bumps 4 months ago.  She continues to complain of paresthesias in the feet and fingertips mostly in the form of pain and burning and at times discoloration and cold sensation.  She is unable to walk bare feet.  She has had no falls or injuries.  She admits her balance is poor but she refuses to use a cane or a walker.  She has obtained some benefit after increasing the dose of gabapentin to 300 mg at night and she continues to take 100 mg in the morning and afternoon.  She has had no recurrent stroke or TIAs.  She remains on Eliquis and is tolerating it well with only minor bruising and no major bleeding episodes.  She states her blood pressure stays good.  She has recently been switched to Repatha last month for her elevated lipids.  LDL cholesterol on 05/20/2023 was 1 1 8  mg percent.  Lipoprotein a was elevated at 85 on 04/12/2023.  She had an echocardiogram on 03/14/2023 which showed concentric left ventricular hypertrophy with severe dilatation of left atrium with ejection fraction of 65 to 70%.  MRI scan of the brain on 03/21/2023 showed no acute abnormality. PERTINENT IMAGING  Per hospitalization 04/13/2022 - 04/16/2022 CT no acute abnormality CTA head and neck bilateral fetal PCAs MRI right cerebellum, midbrain, left occipital, right parietal punctate infarcts 2D Echo EF 60-65% LE venous doppler negative for DVT 10/2020 30-day heart monitoring showed short runs of PACs  LDL 97 HgbA1c 5.4    ROS:   14 system  review of systems performed and negative with exception of those listed in HPI  PMH:  Past Medical History:  Diagnosis Date   Allergic rhinitis    Anemia    hx   Arthritis    Asthma    hx yrs ago   Cirrhosis (HCC) last albumin 3.3 done at [facility] 06-16-2014 (under care everywhere tab in epic)   Secondary to Fatty liver --  followed by hepatology at [facility] (dr Ardine Eng)    Depression    Diabetes mellitus type II    type 2 diet conrolled   Dyspnea    Fibromyalgia    GERD (gastroesophageal reflux disease)    Heart murmur    asymptomatic ---  1989 from rhuematic fever   History of exercise stress test    05-05-2013---  negative bruce ETT given exercise workload,  no ischemia   History of hiatal hernia    History of kidney stones    History of rheumatic fever    1989   Hyperlipidemia    Leukocytopenia  Moderate aortic stenosis    AVA area 1.1cm2---  cardiologist --  dr Lannie Fields, 2014 in epic   NASH (nonalcoholic steatohepatitis)    OSA (obstructive sleep apnea)    was using CPAP before gastric sleeve 2015--  no uses after wt loss   Pneumonia    hx   Sjogren's syndrome (HCC)    Thrombocytopenia (HCC)     PSH:  Past Surgical History:  Procedure Laterality Date   AORTIC VALVE REPLACEMENT N/A 12/05/2017   Procedure: AORTIC VALVE REPLACEMENT (AVR) TISSUE VALVE INSPIRIS;  Surgeon: Alleen Borne, MD;  Location: [facility] OR;  Service: Open Heart Surgery;  Laterality: N/A;   COLONOSCOPY WITH ESOPHAGOGASTRODUODENOSCOPY (EGD)     CYSTOSCOPY WITH RETROGRADE PYELOGRAM, URETEROSCOPY AND STENT PLACEMENT Left 01/21/2015   Procedure: CYSTOSCOPY WITH LEFT  RETROGRADE PYELOGRAM, LEFT URETEROSCOPY AND STENT PLACEMENT;  Surgeon: Jerilee Field, MD;  Location: [facility];  Service: Urology;  Laterality: Left;   CYSTOSCOPY WITH RETROGRADE PYELOGRAM, URETEROSCOPY AND STENT PLACEMENT Bilateral 02/24/2015   Procedure: CYSTOSCOPY WITH RIGHT RETROGRADE PYELOGRAM, BLADDER BIOPSY  FULGERATION LEFT URETEROSCOPY AND STENT REPLACEMENT;  Surgeon: Jerilee Field, MD;  Location: [facility];  Service: Urology;  Laterality: Bilateral;   EXPLORATORY LAPAROSCOPY W/  CONE BIOPSY'S LEFT AND RIGHT LOBE OF LIVER  11-04-2007   HOLMIUM LASER APPLICATION Left 02/24/2015   Procedure: HOLMIUM LASER LITHOTRIPSY;  Surgeon: Jerilee Field, MD;  Location[facility];  Service: Urology;  Laterality: Left;   HYSTEROSCOPY WITH D & C N/A 12/11/2012   Procedure: DILATATION AND CURETTAGE /HYSTEROSCOPY;  Surgeon: Dorien Chihuahua. Richardson Dopp, MD;  Location: [facility];  Service: Gynecology;  Laterality: N/A;   INGUINAL HERNIA REPAIR Left 10/22/2016   Procedure: LEFT INGUINAL HERNIA REPAIR;  Surgeon: Emelia Loron, MD;  Location: [facility] OR;  Service: General;  Laterality: Left;  TAP BLOCK   INSERTION OF MESH Left 10/22/2016   Procedure: INSERTION OF MESH;  Surgeon: Emelia Loron, MD;  Location: [facility] OR;  Service: General;  Laterality: Left;   LAPAROSCOPIC GASTRIC SLEEVE RESECTION  07-27-2013   LOOP RECORDER INSERTION N/A 04/16/2022   Procedure: LOOP RECORDER INSERTION;  Surgeon: [facility] Salvia, MD;  [facility]: [facility] INVASIVE CV LAB;  Service: Cardiovascular;  Laterality: N/A;   PUBOVAGINAL SLING  04-10-2001   [facility]   RIGHT HEART CATH N/A 04/11/2023   Procedure: RIGHT HEART CATH;  Surgeon: Laurey Morale, MD;  Location: [facility] INVASIVE CV LAB;  Service: Cardiovascular;  Laterality: N/A;   RIGHT/LEFT HEART CATH AND CORONARY ANGIOGRAPHY N/A 08/23/2017   Procedure: RIGHT/LEFT HEART CATH AND CORONARY ANGIOGRAPHY;  Surgeon: Corky Crafts, MD;  Location: [facility] INVASIVE CV LAB;  Service: Cardiovascular;  Laterality: N/A;   TEE WITHOUT CARDIOVERSION N/A 12/05/2017   Procedure: TRANSESOPHAGEAL ECHOCARDIOGRAM (TEE);  Surgeon: Alleen Borne, MD;  Location: [facility] OR;  Service: Open Heart Surgery;  Laterality: N/A;   TONSILLECTOMY  1975   TRANSTHORACIC ECHOCARDIOGRAM  06-04-2012  dr Eldridge Dace   mild LVH,   grade I diastolic dysfunction/  ef 60-65%/  moderate LAE/  mild MV calcifation without stenosis,  mild MR/  moderate AV stenosis,  cannot r/o bicupsid, area 1.1cm2/  mild dilated aortic root/  trivial TR    Social History:  Social History   Socioeconomic History   Marital status: Widowed    Spouse name: Married to [employer]   Number of children: Not on file   Years of education: Not on file   Highest education level:  Not on file  Occupational History   Occupation: principal of [employer]  Tobacco Use   Smoking status: Never   Smokeless tobacco: Never   Tobacco comments:    Never smoke 05/08/22  Vaping Use   Vaping status: Never Used  Substance and Sexual Activity   Alcohol use: Yes    Comment: rarely   Drug use: No   Sexual activity: Not on file  Other Topics Concern   Not on file  Social History Narrative   Not on file   Social Drivers of Health   Financial Resource Strain: Not on file  Food Insecurity: No Food Insecurity (04/08/2023)   Hunger Vital Sign    Worried About Running Out of Food in the Last Year: Never true    Ran Out of Food in the Last Year: Never true  Transportation Needs: No Transportation Needs (04/08/2023)   PRAPARE - Administrator, Civil Service (Medical): No    Lack of Transportation (Non-Medical): No  Physical Activity: Not on file  Stress: Not on file  Social Connections: Not on file  Intimate Partner Violence: Not At Risk (04/08/2023)   Humiliation, Afraid, Rape, and Kick questionnaire    Fear of Current or Ex-Partner: No    Emotionally Abused: No    Physically Abused: No    Sexually Abused: No    Family History:  Family History  Problem Relation Age of Onset   Alzheimer's disease Father    Hip fracture Father    Asthma Father    Heart disease Father    Heart attack Father    Hypertension Father    Rheum arthritis Mother    Heart disease Mother    Allergies Daughter    Asthma Paternal Grandmother    Asthma Daughter     Stroke Neg Hx     Medications:   Current Outpatient Medications on File Prior to Visit  Medication Sig Dispense Refill   albuterol (PROVENTIL) (2.5 MG/3ML) 0.083% nebulizer solution Take 3 mLs (2.5 mg total) by nebulization every 6 (six) hours as needed for wheezing or shortness of breath. 75 mL 5   albuterol (VENTOLIN HFA) 108 (90 Base) MCG/ACT inhaler INHALE 2 PUFFS INTO THE LUNGS EVERY 4 HOURS AS NEEDED FOR WHEEZING OR SHORTNESS OF BREATH. 8.5 each 5   Cyanocobalamin (B-12 PO) Take 1 tablet by mouth in the morning.     ELIQUIS 5 MG TABS tablet TAKE 1 TABLET BY MOUTH TWICE A DAY 90 tablet 2   Evolocumab (REPATHA SURECLICK) 140 MG/ML SOAJ Inject 140 mg into the skin every 14 (fourteen) days. 6 mL 3   ezetimibe (ZETIA) 10 MG tablet Take 10 mg by mouth at bedtime.   11   FARXIGA 10 MG TABS tablet Take 1 tablet (10 mg total) by mouth daily before breakfast. 90 tablet 3   gabapentin (NEURONTIN) 100 MG capsule Take 1 capsule (100 mg total) by mouth 2 (two) times daily at 10 am and 4 pm AND 3 capsules (300 mg total) at bedtime. 150 capsule 5   MAGNESIUM GLUCONATE PO Take 1 tablet by mouth daily.     nadolol (CORGARD) 40 MG tablet TAKE 1 TABLET BY MOUTH EVERY DAY 90 tablet 2   potassium chloride SA (KLOR-CON M) 20 MEQ tablet Take 1 tablet (20 mEq total) by mouth daily. 90 tablet 3   Semaglutide,0.25 or 0.5MG /DOS, (OZEMPIC, 0.25 OR 0.5 MG/DOSE,) 2 MG/3ML SOPN Inject 0.5 mg into the skin once a week.  Tiotropium Bromide-Olodaterol (STIOLTO RESPIMAT) 2.5-2.5 MCG/ACT AERS INHALE 2 PUFFS INTO THE LUNGS DAILY. INHALE 2 PUFFS BY MOUTH INTO THE LUNGS DAILY 12 g 0   furosemide (LASIX) 20 MG tablet Take 2 tablets (40 mg total) by mouth daily. 90 tablet 3   No current facility-administered medications on file prior to visit.    Allergies:   Allergies  Allergen Reactions   Prednisone     Other Reaction(s): delerium   Doxycycline Hives and Rash   Naproxen Rash and Hives       OBJECTIVE:  Physical Exam  Vitals:   06/25/23 0939  BP: 131/84  Pulse: 87  Weight: 218 lb (98.9 kg)  Height: 5\' 4"  (1.626 m)   Body mass index is 37.42 kg/m. No results found.  General: Mildly obese middle-aged lady pleasant middle-age female, seated, in no evident distress.  She is on home oxygen Head: head normocephalic and atraumatic.   Neck: supple with no carotid or supraclavicular bruits Cardiovascular: regular rate and rhythm, no murmurs Musculoskeletal: no deformity Skin:  no rash/petichiae Vascular:  Normal pulses all extremities   Neurologic Exam Mental Status: Awake and fully alert.  Fluent speech and language.  Oriented to place and time. Recent and remote memory intact. Attention span, concentration and fund of knowledge appropriate. Mood and affect appropriate.  Cranial Nerves: Fundoscopic exam not done pupils equal, briskly reactive to light. Extraocular movements full without nystagmus. Visual fields full to confrontation. Hearing intact. Facial sensation intact. Face, tongue, palate moves normally and symmetrically.  Motor: Normal bulk and tone. Normal strength in all tested extremity muscles Sensory.: intact to touch , pinprick , position and vibratory sensation except decreased sensation BLE distally (chronic).  Diminished vibration sensation from ankle down bilaterally. Coordination: Rapid alternating movements normal in all extremities. Finger-to-nose and heel-to-shin performed accurately bilaterally. Gait and Station: Arises from chair without difficulty. Stance is normal. Gait demonstrates wide-based gait with normal stride length and balance without use of AD.  Unable to do tandem walking. Reflexes: 1+ and symmetric except ankle jerks are depressed bilaterally.. Toes downgoing.           ASSESSMENT: Whitney [name] is a 68 y.o. year old female with scattered punctate infarcts at right cerebellum, midbrain, left occipital and right parietal on  04/13/2022 of unclear etiology s/p ILR. Vascular risk factors include DM, HLD, hx of arrhythmia with postop AVR A-fib but 30 day monitor no evidence of A-fib, advanced age and OSA.  Chronic complaints of lower extremity paresthesia and gait and balance difficulties likely from neuropathy pain from diabetic  and Sjogren`s small fiber neuropathy    PLAN:  I had a long discussion with the patient regarding her gait and balance difficulties and neuropathic pain which is likely a combination of diabetic neuropathy as well as  her recent strokes and answered questions.  I recommended continue gabapentin in the current dose of 100 mg twice daily and 300 mg at night to help with neuropathic pain.  I advised her to get up slowly and avoid quick and sudden movements.  I encouraged her to use a walker or cane while walking outdoors and long distances and we discussed fall safety precautions.  Continue Eliquis for stroke prevention for A-fib and maintain aggressive risk factor modification with strict control of hypertension with blood pressure goal below 130/90, lipids with LDL cholesterol goal below 70 mg percent and diabetes with insulin see goal below 6 point percent.  .  Check screening carotid ultrasound..  She  will return for follow-up in the future in 6 months.  Shanda Bumps nurse practitioner call earlier if necessary. Greater than 50% time during this 35-minute visit was spent in counseling and coordination of care about her neuropathic pain and gait and balance  Delia Heady, MD  [facility] [address] [place], [place] [address]  Phone [phone] Fax [phone] Note: This document was prepared with digital dictation and possible smart phrase technology. Any transcriptional errors that result from this process are unintentional.

## 2023-06-26 ENCOUNTER — Ambulatory Visit: Payer: Medicare PPO | Admitting: Cardiovascular Disease

## 2023-06-27 ENCOUNTER — Other Ambulatory Visit (HOSPITAL_COMMUNITY): Payer: Self-pay | Admitting: Physician Assistant

## 2023-06-30 ENCOUNTER — Other Ambulatory Visit (HOSPITAL_COMMUNITY): Payer: Self-pay | Admitting: Interventional Cardiology

## 2023-07-01 DIAGNOSIS — J441 Chronic obstructive pulmonary disease with (acute) exacerbation: Secondary | ICD-10-CM | POA: Diagnosis not present

## 2023-07-03 ENCOUNTER — Ambulatory Visit (HOSPITAL_COMMUNITY)
Admission: RE | Admit: 2023-07-03 | Discharge: 2023-07-03 | Disposition: A | Discharge status: Home / Self Care | Payer: Medicare PPO | Source: Ambulatory Visit | Attending: Neurology | Admitting: Neurology

## 2023-07-03 DIAGNOSIS — Z8673 Personal history of transient ischemic attack (TIA), and cerebral infarction without residual deficits: Secondary | ICD-10-CM | POA: Insufficient documentation

## 2023-07-03 DIAGNOSIS — I6529 Occlusion and stenosis of unspecified carotid artery: Secondary | ICD-10-CM | POA: Diagnosis not present

## 2023-07-03 NOTE — Progress Notes (Signed)
 Carotid arterial duplex completed. Please see CV Procedures for preliminary results.  Shona Simpson, RVT 07/03/23 1:05 PM

## 2023-07-04 DIAGNOSIS — E559 Vitamin D deficiency, unspecified: Secondary | ICD-10-CM | POA: Diagnosis not present

## 2023-07-04 DIAGNOSIS — E7800 Pure hypercholesterolemia, unspecified: Secondary | ICD-10-CM | POA: Diagnosis not present

## 2023-07-04 DIAGNOSIS — Z9884 Bariatric surgery status: Secondary | ICD-10-CM | POA: Diagnosis not present

## 2023-07-04 DIAGNOSIS — E1121 Type 2 diabetes mellitus with diabetic nephropathy: Secondary | ICD-10-CM | POA: Diagnosis not present

## 2023-07-04 DIAGNOSIS — D696 Thrombocytopenia, unspecified: Secondary | ICD-10-CM | POA: Diagnosis not present

## 2023-07-05 ENCOUNTER — Telehealth (HOSPITAL_COMMUNITY): Payer: Self-pay

## 2023-07-05 ENCOUNTER — Encounter (HOSPITAL_COMMUNITY): Payer: Self-pay | Admitting: Cardiology

## 2023-07-05 ENCOUNTER — Ambulatory Visit (HOSPITAL_COMMUNITY)
Admission: RE | Admit: 2023-07-05 | Discharge: 2023-07-05 | Disposition: A | Discharge status: Home / Self Care | Payer: Medicare PPO | Source: Ambulatory Visit | Attending: Cardiology | Admitting: Cardiology

## 2023-07-05 ENCOUNTER — Other Ambulatory Visit (HOSPITAL_COMMUNITY): Payer: Self-pay

## 2023-07-05 VITALS — BP 104/70 | HR 67 | Wt 218.0 lb

## 2023-07-05 DIAGNOSIS — I080 Rheumatic disorders of both mitral and aortic valves: Secondary | ICD-10-CM | POA: Insufficient documentation

## 2023-07-05 DIAGNOSIS — M3500 Sicca syndrome, unspecified: Secondary | ICD-10-CM | POA: Insufficient documentation

## 2023-07-05 DIAGNOSIS — I4891 Unspecified atrial fibrillation: Secondary | ICD-10-CM | POA: Insufficient documentation

## 2023-07-05 DIAGNOSIS — Z952 Presence of prosthetic heart valve: Secondary | ICD-10-CM | POA: Insufficient documentation

## 2023-07-05 DIAGNOSIS — G4733 Obstructive sleep apnea (adult) (pediatric): Secondary | ICD-10-CM | POA: Diagnosis not present

## 2023-07-05 DIAGNOSIS — I272 Pulmonary hypertension, unspecified: Secondary | ICD-10-CM | POA: Diagnosis not present

## 2023-07-05 DIAGNOSIS — K7460 Unspecified cirrhosis of liver: Secondary | ICD-10-CM | POA: Diagnosis not present

## 2023-07-05 DIAGNOSIS — Z7901 Long term (current) use of anticoagulants: Secondary | ICD-10-CM | POA: Insufficient documentation

## 2023-07-05 DIAGNOSIS — I2781 Cor pulmonale (chronic): Secondary | ICD-10-CM | POA: Diagnosis not present

## 2023-07-05 DIAGNOSIS — J9611 Chronic respiratory failure with hypoxia: Secondary | ICD-10-CM | POA: Diagnosis not present

## 2023-07-05 DIAGNOSIS — I110 Hypertensive heart disease with heart failure: Secondary | ICD-10-CM | POA: Insufficient documentation

## 2023-07-05 DIAGNOSIS — I50810 Right heart failure, unspecified: Secondary | ICD-10-CM | POA: Insufficient documentation

## 2023-07-05 DIAGNOSIS — I5032 Chronic diastolic (congestive) heart failure: Secondary | ICD-10-CM | POA: Insufficient documentation

## 2023-07-05 MED ORDER — TADALAFIL (PAH) 20 MG PO TABS: 40.0000 mg | ORAL_TABLET | Freq: Every day | ORAL | 11 refills | Status: DC

## 2023-07-05 NOTE — Progress Notes (Signed)
 6 Min Walk Test Completed  Pt ambulated 1119ft (335.28 m) O2 Sat ranged 96%-91 on 3- 4L oxygen HR ranged 65-86

## 2023-07-05 NOTE — Patient Instructions (Addendum)
 START Tadalafil 20 mg daily.   Please follow up with our heart failure pharmacist as scheduled.  Your physician recommends that you schedule a follow-up appointment in: 3 months ( May) ** PLEASE CALL THE OFFICE IN MID MARCH TO ARRANGE YOUR FOLLOW UP APPOINTMENT.**  If you have any questions or concerns before your next appointment please send [place] a message through [place] or call our office at [phone].    TO LEAVE A MESSAGE FOR THE NURSE SELECT OPTION 2, PLEASE LEAVE A MESSAGE INCLUDING: YOUR NAME DATE OF BIRTH CALL BACK NUMBER REASON FOR CALL**this is important as we prioritize the call backs  YOU WILL RECEIVE A CALL BACK THE SAME DAY AS LONG AS YOU CALL BEFORE 4:00 PM  At the [facility], you and your health needs are our priority. As part of our continuing mission to provide you with exceptional heart care, we have created designated Provider Care Teams. These Care Teams include your primary Cardiologist (physician) and Advanced Practice Providers (APPs- Physician Assistants and Nurse Practitioners) who all work together to provide you with the care you need, when you need it.   You may see any of the following providers on your designated Care Team at your next follow up: Dr Arvilla Meres Dr Marca Ancona Dr. Dorthula Nettles Dr. Clearnce Hasten Amy Filbert Schilder, NP Robbie Lis, [place] [facility] [place], [place] Brynda Peon, NP [place] Lee, NP Clarisa Kindred, NP Karle Plumber, PharmD Enos Fling, PharmD   Please be sure to bring in all your medications bottles to every appointment.    Thank you for choosing [facility]

## 2023-07-05 NOTE — Telephone Encounter (Signed)
 Advanced Heart Failure Patient Advocate Encounter  Prior authorization for Tadalafil [facility]) has been submitted and approved. Test billing returns $0 for 30 day supply.  Key: [id number] Effective: 05/08/2023 to 05/06/2024  Burnell Blanks, CPhT Rx Patient Advocate Phone: [phone]

## 2023-07-07 NOTE — Progress Notes (Signed)
 PCP: Daisy Floro, MD HF Cardiology: Dr. Shirlee Latch  Chief complaint: Pulmonary hypertension  68 y.o. with complicated history of Sjogren's syndrome, cirrhosis suspected to be due to NAFLD, rheumatic heart disease s/p bioprosthetic AVR, and pulmonary hypertension with RV failure was referred by Dr. Cristal Deer for evaluation of pulmonary hypertension/RV failure.  Patient was followed by Dr. Eldridge Dace in the past.  She was thought to have had rheumatic fever and developed severe aortic stenosis with mild mitral stenosis.  She had cath in 4/19 showing minimal CAD then had bioprosthetic AVR in 2019.  She also has been found to have restrictive lung disease though most recent high resolution CT of chest in 12/24 did not show a specific diagnosis (bronchial wall thickening, bibasilar atelectasis, chronic elevated right hemidiaphragm). She has been followed by rheumatology for Sjogren's syndrome and fibromyalgia.  She is followed by GI for cirrhosis thought to be due to NAFLD with varices and thrombocytopenia.   Echo in 11/24 showed EF 65-70%, moderate LVH, PASP 68 mmHg, mildly decreased RV systolic function, mild MR, mild mitral stenosis with mean gradient 4.5 mmHg, bioprosthetic aortic valve with mean gradient 24 mmHg and EOA 1.45 cm^2 (suspect patient-prosthesis mismatch), dilated IVC.    She developed worsening dyspnea and peripheral edema and was admitted with CHF in 12/24.  She was diuresed and was started on home oxygen at discharge.  RHC during this admission after diuresis showed elevated RA pressure with normal PCWP, moderate pulmonary arterial hypertension with relatively high cardiac output and PVR 4.1 WU suggesting a component of pulmonary arterial hypertension.   She returns for followup of pulmonary hypertension and RV failure.  Weight is down 20 lbs.  She is now taking Lasix 40 mg daily.  She continues to use 3-4 L home oxygen.  No problems walking around the house and doing housework.  She is  short of breath walking longer distances.  She is using CPAP at night.  No chest pain. She has chronically raised the head of her bed at night.  No palpitations or lightheadedness.    ECG (personally reviewed): NSR, anteroseptal Qs  6 minute walk (2/25): 335 m  Labs (12/24): K 4, creatinine 0.79, hgb 12.8, plts 47, AST and ALT normal, Lp(a) 82, SSA+, SSB+, ANA+, RF 17.9, anti-centromere negative Labs (2/25): hgb 13.9, K 5, creatinine 0.94, LFTs normal.   PMH: 1. OSA: CPAP 2. Cirrhosis: NAFLD with varices.  3. CVA: 12/23, AF-related. 4. Atrial fibrillation: Paroxysmal. 5. Sjogren's syndrome 6. Hyperlipidemia 7. LHC (4/19): minimal CAD. 8. Chronic elevation of the right hemidiaphragm.  9. Fibromyalgia 10. Rheumatic fever: Aortic stenosis with bioprosthetic AVR in 2019. Mild mitral stenosis.  - Echo (11/24) with EF 65-70%, moderate LVH, PASP 68 mmHg, mildly decreased RV systolic function, mild MR, mild mitral stenosis with mean gradient 4.5 mmHg, bioprosthetic aortic valve with mean gradient 24 mmHg and EOA 1.45 cm^2 (suspect patient-prosthesis mismatch), dilated IVC.   11. Type 2 diabetes 12. Thrombocytopenia: Suspect due to cirrhosis.  13. Sleeve gastrectomy 2015.  14. Pulmonary hypertension/RV failure: Possible portopulmonary hypertension.  - RHC (12/24): mean RA 12, PA 61/25 mean 40, mean PCWP 11, CI 3.34, PVR 4.1 WU, PAPi 3.6, SVC sat 71%, PA sat 70%.  - High resolution CT chest (12/24): bronchial wall thickening, bibasilar atelectasis, elevated right hemidiaphragm.  - V/Q scan in 12/24 was concerning for chronic PE but CTA chest showed no evidence for PE or ILD.  15. Diabetic neuropathy  SH: Widow, nonsmoker, lives with son who has  medical problems.  No ETOH.   Family History  Problem Relation Age of Onset   Alzheimer's disease Father    Hip fracture Father    Asthma Father    Heart disease Father    Heart attack Father    Hypertension Father    Rheum arthritis Mother     Heart disease Mother    Allergies Daughter    Asthma Paternal Grandmother    Asthma Daughter    Stroke Neg Hx    ROS: All systems reviewed and negative except as per HPI.   Current Outpatient Medications  Medication Sig Dispense Refill   albuterol (PROVENTIL) (2.5 MG/3ML) 0.083% nebulizer solution Take 3 mLs (2.5 mg total) by nebulization every 6 (six) hours as needed for wheezing or shortness of breath. 75 mL 5   albuterol (VENTOLIN HFA) 108 (90 Base) MCG/ACT inhaler INHALE 2 PUFFS INTO THE LUNGS EVERY 4 HOURS AS NEEDED FOR WHEEZING OR SHORTNESS OF BREATH. 8.5 each 5   ELIQUIS 5 MG TABS tablet TAKE 1 TABLET BY MOUTH TWICE A DAY 90 tablet 2   Evolocumab (REPATHA SURECLICK) 140 MG/ML SOAJ Inject 140 mg into the skin every 14 (fourteen) days. 6 mL 3   ezetimibe (ZETIA) 10 MG tablet Take 10 mg by mouth at bedtime.   11   FARXIGA 10 MG TABS tablet Take 1 tablet (10 mg total) by mouth daily before breakfast. 90 tablet 3   furosemide (LASIX) 20 MG tablet Take 2 tablets (40 mg total) by mouth daily. 90 tablet 3   gabapentin (NEURONTIN) 100 MG capsule Take 1 capsule (100 mg total) by mouth 2 (two) times daily at 10 am and 4 pm AND 3 capsules (300 mg total) at bedtime. 150 capsule 5   MAGNESIUM GLUCONATE PO Take 1 tablet by mouth daily.     nadolol (CORGARD) 40 MG tablet TAKE 1 TABLET BY MOUTH EVERY DAY 30 tablet 0   potassium chloride SA (KLOR-CON M) 20 MEQ tablet Take 1 tablet (20 mEq total) by mouth daily. 90 tablet 3   Semaglutide,0.25 or 0.5MG /DOS, (OZEMPIC, 0.25 OR 0.5 MG/DOSE,) 2 MG/3ML SOPN Inject 0.5 mg into the skin once a week.     tadalafil, PAH, (ADCIRCA) 20 MG tablet Take 2 tablets (40 mg total) by mouth daily. 60 tablet 11   Tiotropium Bromide-Olodaterol (STIOLTO RESPIMAT) 2.5-2.5 MCG/ACT AERS INHALE 2 PUFFS INTO THE LUNGS DAILY. INHALE 2 PUFFS BY MOUTH INTO THE LUNGS DAILY 12 g 0   No current facility-administered medications for this encounter.   BP 104/70   Pulse 67   Wt 98.9 kg  (218 lb)   SpO2 98%   BMI 37.42 kg/m  General: NAD Neck: No JVD, no thyromegaly or thyroid nodule.  Lungs: Clear to auscultation bilaterally with normal respiratory effort. CV: Nondisplaced PMI.  Heart regular S1/S2, no S3/S4, 1/6 SEM RUSB.  No peripheral edema.  No carotid bruit.  Normal pedal pulses.  Abdomen: Soft, nontender, no hepatosplenomegaly, no distention.  Skin: Intact without lesions or rashes.  Neurologic: Alert and oriented x 3.  Psych: Normal affect. Extremities: No clubbing or cyanosis.  HEENT: Normal. '  Assessment/Plan: 1. RV failure/cor pulmonale: Echo in 11/24 showed EF 65-70%, moderate LVH, PASP 68 mmHg, mildly decreased RV systolic function, mild MR, mild mitral stenosis with mean gradient 4.5 mmHg, bioprosthetic aortic valve with mean gradient 24 mmHg and EOA 1.45 cm^2 (suspect patient-prosthesis mismatch), dilated IVC.  RHC in 12/24 showed elevated right-sided filling pressures in the setting of  moderate pulmonary hypertension.  Volume status much improved today compared to last appointment, weight down 20 lbs.  NYHA class II.  - Continue Lasix 40 mg daily and KCl 20 daily.  - Continue Farxiga 10 daily.  - She is on nadolol for portal hypertension/varices. Need to be careful with this with RV failure, would not increase.  2. Pulmonary hypertension: Echo in 11/24 with PASP 68 and mild RV dysfunction.  RHC In 12/24 with PA 61/25 mean 40, PCWP 11, PVR 4.11 WU with CI 3.34. Suspect mixed pulmonary hypertension due to high output in the setting of cirrhosis + possible portopulmonary hypertension (group 1 PH).  Sjogren's syndrome itself can be associated with group 1 pulmonary hypertension as well.  Patient has chronic restrictive lung disease with elevated right hemidiaphragm though HRCT chest did not show interstitial lung disease or emphysema, suspect not primarily group 3 PH.  Though V/Q scan in 12/24 was concerning for chronic PEs, the CTA chest in 12/24 did not show any  evidence for PE.  Overall, strong suspicion for group 1 PH from portopulmonary hypertension or Sjogren's syndrome. Baseline 6 minute walk done today.  - I will start her on tadalafil 20 mg daily today.  Would be careful using ERA given cirrhosis diagnosis.  - Followup with HF pharmacist in 3 wks to increase tadalafil to 40 mg daily, next step will like be Uptravi.  - Has OSA, should continue CPAP.  3. Chronic hypoxemic respiratory failure: She went home from the hospital in 12/24 on home oxygen.  HRCT chest showed elevated right hemidiaphragm with bronchial wall thickening and bibasilar scarring, but no ILD or emphysema.  She remains on home oxygen.  - Follows with pulmonary.  4. Atrial fibrillation: She has been in NSR recently, no palpitations.  - Continue apixaban 5 mg bid.  5. Cirrhosis: Suspected due to NAFLD.  Had chronic thrombocytopenia and h/o varices.   - On nadolol for portal HTN/varices, would not increase with RV failure.  6. Rheumatic AS/MS: S/p bioprosthetic AVR.  Mitral stenosis was mild on last echo.  Echo in 11/24 showed elevated mean gradient across the aortic valve, 24 mmHg.  This was thought to be due to a degree of patient-prosthesis mismatch.   Followup HF pharmacist in 3 wks to increase tadalafil, then see me in 2 months.     I spent 31 minutes reviewing records, interviewing/examining patient, and managing orders.     Whitney [name] 07/07/2023

## 2023-07-08 ENCOUNTER — Encounter: Payer: Self-pay | Admitting: Internal Medicine

## 2023-07-08 ENCOUNTER — Other Ambulatory Visit (HOSPITAL_COMMUNITY): Payer: Self-pay | Admitting: Cardiology

## 2023-07-09 DIAGNOSIS — E1142 Type 2 diabetes mellitus with diabetic polyneuropathy: Secondary | ICD-10-CM | POA: Diagnosis not present

## 2023-07-09 DIAGNOSIS — D696 Thrombocytopenia, unspecified: Secondary | ICD-10-CM | POA: Diagnosis not present

## 2023-07-09 DIAGNOSIS — Z0000 Encounter for general adult medical examination without abnormal findings: Secondary | ICD-10-CM | POA: Diagnosis not present

## 2023-07-09 DIAGNOSIS — E7800 Pure hypercholesterolemia, unspecified: Secondary | ICD-10-CM | POA: Diagnosis not present

## 2023-07-09 DIAGNOSIS — K7460 Unspecified cirrhosis of liver: Secondary | ICD-10-CM | POA: Diagnosis not present

## 2023-07-09 DIAGNOSIS — Z952 Presence of prosthetic heart valve: Secondary | ICD-10-CM | POA: Diagnosis not present

## 2023-07-09 DIAGNOSIS — Z9884 Bariatric surgery status: Secondary | ICD-10-CM | POA: Diagnosis not present

## 2023-07-09 DIAGNOSIS — J449 Chronic obstructive pulmonary disease, unspecified: Secondary | ICD-10-CM | POA: Diagnosis not present

## 2023-07-12 ENCOUNTER — Other Ambulatory Visit (HOSPITAL_COMMUNITY): Payer: Self-pay

## 2023-07-12 ENCOUNTER — Other Ambulatory Visit: Payer: Self-pay

## 2023-07-12 ENCOUNTER — Ambulatory Visit: Payer: Medicare PPO | Attending: Internal Medicine | Admitting: Internal Medicine

## 2023-07-12 ENCOUNTER — Other Ambulatory Visit (HOSPITAL_BASED_OUTPATIENT_CLINIC_OR_DEPARTMENT_OTHER): Payer: Self-pay

## 2023-07-12 ENCOUNTER — Encounter: Payer: Self-pay | Admitting: Internal Medicine

## 2023-07-12 ENCOUNTER — Telehealth (HOSPITAL_COMMUNITY): Payer: Self-pay

## 2023-07-12 VITALS — BP 118/76 | HR 68 | Resp 16 | Ht 63.75 in | Wt 219.0 lb

## 2023-07-12 DIAGNOSIS — M3500 Sicca syndrome, unspecified: Secondary | ICD-10-CM | POA: Diagnosis not present

## 2023-07-12 DIAGNOSIS — I272 Pulmonary hypertension, unspecified: Secondary | ICD-10-CM | POA: Diagnosis not present

## 2023-07-12 DIAGNOSIS — K7469 Other cirrhosis of liver: Secondary | ICD-10-CM | POA: Diagnosis not present

## 2023-07-12 MED ORDER — TADALAFIL (PAH) 20 MG PO TABS
40.0000 mg | ORAL_TABLET | Freq: Every day | ORAL | 11 refills | Status: DC
  Filled 2023-07-12: qty 60, 30d supply, fill #0
  Filled 2023-08-06: qty 60, 30d supply, fill #1

## 2023-07-12 NOTE — Progress Notes (Signed)
 Office Visit Note  Patient: Whitney [name]             Date of Birth: 09-Nov-1955           MRN: [id number]             PCP: Daisy Floro, MD Referring: Daisy Floro, MD Visit Date: 07/12/2023   Subjective:  New Patient (Initial Visit) (Patient states she has pain in her left hip and was told it was the bursa. Patient states she was going to PT and did dry needling on her left hip which helped a lot. Patient states she does sometimes get cramps in her hands.  )   Discussed the use of AI scribe software for clinical note transcription with the patient, who gave verbal consent to proceed.  History of Present Illness   Whitney [name] is a 68 y.o. female here to establish care for sjogren syndrome. She was a patient of Dr. Sharmon Revere and previously Dr. Orson Ape.  She has a history of Sjogren's syndrome, which has been out of remission for the past six months. She experiences severe dryness, particularly in her mouth and eyes, and swelling of the salivary glands. Her symptoms have worsened, with her mouth feeling 'hot' and her eyes being 'awful' despite using Xiidra. She has previously tried Restasis without significant relief. She has used pilocarpine for her Sjogren's syndrome but discontinued it due to excessive sweating, particularly on her face. She has not been on any long-term anti-inflammatory medications like hydroxychloroquine or steroids due to adverse effects, including a delusional state with prednisone.  She experiences Raynaud's-like symptoms, with her fingers turning purple and feeling cold, especially when sitting. She describes her fingertips as 'so cold' and notes a burning sensation when exposed to cold temperatures.  She has a history of NASH and portal hypertension, with concerns about potential cirrhosis. Her mother also had NASH and cirrhosis, which was discovered posthumously. She is aware of the risk of bleeding due to cirrhosis and has discussed this with her  gastroenterologist.  She experiences neuropathy in her feet and legs, described as tingling and numbness, which she attributes to her NASH and portal hypertension rather than diabetes. No tingling or numbness in her hands.  She reports hip bursitis, which was previously treated with dry needling, providing significant relief. She is considering further treatment for this condition.  She uses a CPAP machine with a humidifier for sleep apnea.   Labs reviewed 04/2023 ANA pos SSA >8 SSB >8 dsDNA, centromere, Scl-70 neg RF neg  06/2019 ANA 1:2560 speckled  Activities of Daily Living:  Patient reports morning stiffness for 5-10 minutes.   Patient Denies nocturnal pain.  Difficulty dressing/grooming: Reports Difficulty climbing stairs: Reports Difficulty getting out of chair: Denies Difficulty using hands for taps, buttons, cutlery, and/or writing: Reports  Review of Systems  Constitutional:  Positive for fatigue.  HENT:  Positive for mouth dryness. Negative for mouth sores.   Eyes:  Positive for dryness.  Respiratory:  Positive for shortness of breath.   Cardiovascular:  Negative for chest pain and palpitations.  Gastrointestinal:  Negative for blood in stool, constipation and diarrhea.  Endocrine: Negative for increased urination.  Genitourinary:  Negative for involuntary urination.  Musculoskeletal:  Positive for joint pain, gait problem, joint pain, myalgias, muscle weakness, morning stiffness and myalgias. Negative for joint swelling and muscle tenderness.  Skin:  Negative for color change, rash, hair loss and sensitivity to sunlight.  Allergic/Immunologic: Negative for susceptible to  infections.  Neurological:  Positive for dizziness. Negative for headaches.  Hematological:  Positive for swollen glands.  Psychiatric/Behavioral:  Positive for sleep disturbance. Negative for depressed mood. The patient is nervous/anxious.     PMFS History:  Patient Active Problem List    Diagnosis Date Noted   Acute diastolic heart failure (HCC) 04/07/2023   Peripheral edema 04/07/2023   Acute hypoxemic respiratory failure (HCC) 03/15/2023   Chronic hypoxic respiratory failure (HCC) 03/14/2023   DM2 (diabetes mellitus, type 2) (HCC) 03/14/2023   Elevated troponin 03/14/2023   COPD with acute exacerbation (HCC) 03/12/2023   Acute respiratory failure (HCC) 03/12/2023   Elevated diaphragm 03/12/2023   History of TIA (transient ischemic attack) 03/12/2023   Hypercoagulable state due to paroxysmal atrial fibrillation (HCC) 05/08/2022   Acute CVA (cerebrovascular accident) (HCC) 04/13/2022   COPD (chronic obstructive pulmonary disease) (HCC) 01/18/2021   Pulmonary hypertension, unspecified (HCC) 06/15/2019   Dyspnea 01/06/2019   Paroxysmal atrial fibrillation (HCC) 01/24/2018   S/P AVR 12/05/2017   Thrombocytopenia (HCC) 11/13/2017   Other neutropenia (HCC) 11/13/2017   Numbness in feet 10/14/2017   Severe aortic stenosis    Inguinal hernia 10/22/2016   Obesity (BMI 30.0-34.9) 04/26/2016   Lower extremity edema 06/22/2015   Status post bariatric surgery 03/29/2014   Aortic valve disorder 04/21/2013   Anasarca 04/21/2013   Depression    OSA (obstructive sleep apnea)    Sjogren's syndrome (HCC)    Cirrhosis (HCC)    Hyperlipidemia    Fibromyalgia    Postmenopausal bleeding 12/11/2012   Endometrial polyp 12/11/2012   NASH (nonalcoholic steatohepatitis) 08/06/2012   Chronic cough 04/11/2011    Past Medical History:  Diagnosis Date   Allergic rhinitis    Anemia    hx   Arthritis    Asthma    hx yrs ago   Cirrhosis (HCC) last albumin 3.3 done at [facility] 06-16-2014 (under care everywhere tab in epic)   Secondary to Fatty liver --  followed by hepatology at [facility] (dr Ardine Eng)    Depression    Diabetes mellitus type II    type 2 diet conrolled   Dyspnea    Fibromyalgia    GERD (gastroesophageal reflux disease)    Heart murmur    asymptomatic ---  1989 from  rhuematic fever   History of exercise stress test    05-05-2013---  negative bruce ETT given exercise workload,  no ischemia   History of hiatal hernia    History of kidney stones    History of rheumatic fever    1989   Hyperlipidemia    Leukocytopenia    Moderate aortic stenosis    AVA area 1.1cm2---  cardiologist --  dr Lannie Fields, 2014 in epic   NASH (nonalcoholic steatohepatitis)    OSA (obstructive sleep apnea)    was using CPAP before gastric sleeve 2015--  no uses after wt loss   Pneumonia    hx   Pulmonary hypertension (HCC)    Sjogren's syndrome (HCC)    Thrombocytopenia (HCC)     Family History  Problem Relation Age of Onset   Alzheimer's disease Father    Hip fracture Father    Asthma Father    Heart disease Father    Heart attack Father    Hypertension Father    Rheum arthritis Mother    Heart disease Mother    Allergies Daughter    Asthma Paternal Grandmother    Asthma Daughter    Stroke Neg Hx  Past Surgical History:  Procedure Laterality Date   AORTIC VALVE REPLACEMENT N/A 12/05/2017   Procedure: AORTIC VALVE REPLACEMENT (AVR) TISSUE VALVE INSPIRIS;  Surgeon: Alleen Borne, MD;  Location: [facility] OR;  Service: Open Heart Surgery;  Laterality: N/A;   COLONOSCOPY WITH ESOPHAGOGASTRODUODENOSCOPY (EGD)     CYSTOSCOPY WITH RETROGRADE PYELOGRAM, URETEROSCOPY AND STENT PLACEMENT Left 01/21/2015   Procedure: CYSTOSCOPY WITH LEFT  RETROGRADE PYELOGRAM, LEFT URETEROSCOPY AND STENT PLACEMENT;  Surgeon: Jerilee Field, MD;  Location: [facility];  Service: Urology;  Laterality: Left;   CYSTOSCOPY WITH RETROGRADE PYELOGRAM, URETEROSCOPY AND STENT PLACEMENT Bilateral 02/24/2015   Procedure: CYSTOSCOPY WITH RIGHT RETROGRADE PYELOGRAM, BLADDER BIOPSY FULGERATION LEFT URETEROSCOPY AND STENT REPLACEMENT;  Surgeon: Jerilee Field, MD;  Location: [facility];  Service: Urology;  Laterality: Bilateral;   EXPLORATORY LAPAROSCOPY W/  CONE  BIOPSY'S LEFT AND RIGHT LOBE OF LIVER  11-04-2007   HOLMIUM LASER APPLICATION Left 02/24/2015   Procedure: HOLMIUM LASER LITHOTRIPSY;  Surgeon: Jerilee Field, MD;  Location: [facility];  Service: Urology;  Laterality: Left;   HYSTEROSCOPY WITH D & C N/A 12/11/2012   Procedure: DILATATION AND CURETTAGE /HYSTEROSCOPY;  Surgeon: Dorien Chihuahua. Richardson Dopp, MD;  Location: [facility];  Service: Gynecology;  Laterality: N/A;   INGUINAL HERNIA REPAIR Left 10/22/2016   Procedure: LEFT INGUINAL HERNIA REPAIR;  Surgeon: Emelia Loron, MD;  Location: [facility] OR;  Service: General;  Laterality: Left;  TAP BLOCK   INSERTION OF MESH Left 10/22/2016   Procedure: INSERTION OF MESH;  Surgeon: Emelia Loron, MD;  Location: [facility] OR;  Service: General;  Laterality: Left;   LAPAROSCOPIC GASTRIC SLEEVE RESECTION  07-27-2013   LOOP RECORDER INSERTION N/A 04/16/2022   Procedure: LOOP RECORDER INSERTION;  Surgeon: [facility] Salvia, MD;  Location: [facility] INVASIVE CV LAB;  Service: Cardiovascular;  Laterality: N/A;   PUBOVAGINAL SLING  04-10-2001   [facility]   RIGHT HEART CATH N/A 04/11/2023   Procedure: RIGHT HEART CATH;  Surgeon: Laurey Morale, MD;  Location: [facility] INVASIVE CV LAB;  Service: Cardiovascular;  Laterality: N/A;   RIGHT/LEFT HEART CATH AND CORONARY ANGIOGRAPHY N/A 08/23/2017   Procedure: RIGHT/LEFT HEART CATH AND CORONARY ANGIOGRAPHY;  Surgeon: Corky Crafts, MD;  Location: [facility] INVASIVE CV LAB;  Service: Cardiovascular;  Laterality: N/A;   TEE WITHOUT CARDIOVERSION N/A 12/05/2017   Procedure: TRANSESOPHAGEAL ECHOCARDIOGRAM (TEE);  Surgeon: Alleen Borne, MD;  Location: [facility] OR;  Service: Open Heart Surgery;  Laterality: N/A;   TONSILLECTOMY  1975   TRANSTHORACIC ECHOCARDIOGRAM  06-04-2012  dr Eldridge Dace   mild LVH,  grade I diastolic dysfunction/  ef 60-65%/  moderate LAE/  mild MV calcifation without stenosis,  mild MR/  moderate AV stenosis,  cannot r/o bicupsid, area 1.1cm2/  mild dilated aortic root/  trivial  TR   Social History   Social History Narrative   Not on file   Immunization History  Administered Date(s) Administered   Fluad Quad(high Dose 65+) 01/06/2019, 01/18/2022   Hep A / Hep B 02/11/2013, 03/06/2013, 08/19/2013   Influenza Split 02/25/2011, 01/14/2019   Influenza, Seasonal, Injecte, Preservative Fre 02/11/2013   Influenza,inj,Quad PF,6+ Mos 02/13/2011   Influenza,inj,Quad PF,6-35 Mos 01/31/2018   PFIZER(Purple Top)SARS-COV-2 Vaccination 05/28/2019, 06/17/2019, 01/10/2020, 08/08/2020   PNEUMOCOCCAL CONJUGATE-20 06/20/2021   Pfizer Covid-19 Vaccine Bivalent Booster 74yrs & up 02/20/2021   Pneumococcal Polysaccharide-23 02/06/2019   Tdap 06/30/2010     Objective: Vital Signs: BP 118/76 (BP Location: Right Arm, Patient Position: Sitting, Cuff Size:  Large)   Pulse 68   Resp 16   Ht 5' 3.75" (1.619 m)   Wt 219 lb (99.3 kg)   BMI 37.89 kg/m    Physical Exam HENT:     Mouth/Throat:     Mouth: Mucous membranes are dry.     Pharynx: Oropharynx is clear.     Comments: Decreased salivary pooling Eyes:     Conjunctiva/sclera: Conjunctivae normal.  Neck:     Comments: Chronic enlarged parotid salivary glands without tenderness Cardiovascular:     Rate and Rhythm: Normal rate and regular rhythm.  Pulmonary:     Effort: Pulmonary effort is normal.     Breath sounds: Normal breath sounds.  Lymphadenopathy:     Cervical: No cervical adenopathy.  Skin:    General: Skin is warm and dry.     Comments: No digital pitting, scabbing, or nail changes Tortuous superficial veins in b/l ankles without pitting edema or discoloration  Neurological:     Mental Status: She is alert.  Psychiatric:        Mood and Affect: Mood normal.      Musculoskeletal Exam:  Neck full ROM no tenderness Shoulders full ROM no tenderness or swelling Elbows full ROM no tenderness or swelling Wrists full ROM no tenderness or swelling Fingers full ROM no tenderness or swelling Knees full ROM no  tenderness or swelling Ankles full ROM no tenderness or swelling   Investigation: No additional findings.  Imaging: VAS US CAROTID Result Date: 07/03/2023 Carotid Arterial Duplex Study Patient Name:  JALENA VANDERLINDEN Ginther  Date of Exam:   07/03/2023 Medical Rec #: [id number]       Accession #:    [id number] Date of Birth: 1956-01-31        Patient Gender: F Patient Age:   68 years Exam Location:  [facility] Procedure:      VAS US CAROTID Referring Phys: PRAMOD SETHI --------------------------------------------------------------------------------  Indications:       Carotid artery disease. Risk Factors:      Hyperlipidemia, Diabetes, prior CVA. Comparison Study:  None. Performing Technologist: Shona Simpson  Examination Guidelines: A complete evaluation includes B-mode imaging, spectral Doppler, color Doppler, and power Doppler as needed of all accessible portions of each vessel. Bilateral testing is considered an integral part of a complete examination. Limited examinations for reoccurring indications may be performed as noted.  Right Carotid Findings: +----------+--------+--------+--------+------------------+------------------+           PSV cm/sEDV cm/sStenosisPlaque DescriptionComments           +----------+--------+--------+--------+------------------+------------------+ CCA Prox  64      12                                                   +----------+--------+--------+--------+------------------+------------------+ CCA Distal56      11                                intimal thickening +----------+--------+--------+--------+------------------+------------------+ ICA Prox  35      11      1-39%   heterogenous                         +----------+--------+--------+--------+------------------+------------------+ ICA Distal45      17                                                   +----------+--------+--------+--------+------------------+------------------+  ECA       74       11                                                   +----------+--------+--------+--------+------------------+------------------+ +----------+--------+-------+--------+-------------------+           PSV cm/sEDV cmsDescribeArm Pressure (mmHG) +----------+--------+-------+--------+-------------------+ Subclavian106     0                                  +----------+--------+-------+--------+-------------------+ +---------+--------+--+--------+-+ VertebralPSV cm/s40EDV cm/s8 +---------+--------+--+--------+-+  Left Carotid Findings: +----------+--------+--------+--------+------------------+------------------+           PSV cm/sEDV cm/sStenosisPlaque DescriptionComments           +----------+--------+--------+--------+------------------+------------------+ CCA Prox  86      16                                                   +----------+--------+--------+--------+------------------+------------------+ CCA Distal42      14                                intimal thickening +----------+--------+--------+--------+------------------+------------------+ ICA Prox  39      14                                intimal thickening +----------+--------+--------+--------+------------------+------------------+ ICA Distal73      33                                                   +----------+--------+--------+--------+------------------+------------------+ ECA       51      7                                                    +----------+--------+--------+--------+------------------+------------------+ +----------+--------+--------+--------+-------------------+           PSV cm/sEDV cm/sDescribeArm Pressure (mmHG) +----------+--------+--------+--------+-------------------+ [id number]     0                                   +----------+--------+--------+--------+-------------------+ +---------+--------+--+--------+--+ VertebralPSV cm/s33EDV cm/s10  +---------+--------+--+--------+--+   Summary: Right Carotid: Velocities in the right ICA are consistent with a 1-39% stenosis. Left Carotid: The extracranial vessels were near-normal with only minimal wall               thickening or plaque. Vertebrals:  Bilateral vertebral arteries demonstrate antegrade flow. Subclavians: Normal flow hemodynamics were seen in bilateral subclavian              arteries. *See table(s) above for measurements and observations.  Electronically signed by Coral Else MD on 07/03/2023 at 4:41:46 PM.    Final     Recent Labs: Lab Results  Component Value Date   WBC 3.0 (L) 04/14/2023   HGB 12.8 04/14/2023   PLT 47 (L) 04/14/2023   NA 140 05/02/2023   K 4.9 05/02/2023   CL 91 (L) 05/02/2023   CO2 41 (H) 05/02/2023   GLUCOSE 179 (H) 05/02/2023   BUN 20 05/02/2023   CREATININE 1.02 (H) 05/02/2023   BILITOT 1.2 05/20/2023   ALKPHOS 62 05/20/2023   AST 28 05/20/2023   ALT 12 05/20/2023   PROT 6.1 05/20/2023   ALBUMIN 3.1 (L) 05/20/2023   CALCIUM 8.8 (L) 05/02/2023   GFRAA >60 12/17/2018    Speciality Comments: No specialty comments available.  Procedures:  No procedures performed Allergies: Prednisone, Doxycycline, and Naproxen   Assessment / Plan:     Visit Diagnoses: Sjogren's syndrome, with unspecified organ involvement (HCC) - Plan: Sedimentation rate, C-reactive protein, CBC with Differential/Platelet, COMPLETE METABOLIC PANEL WITH GFR, IgG, IgA, IgM, C3 and C4 Sjogren's syndrome with xerostomia, keratoconjunctivitis sicca, and sialadenitis. Previous pilocarpine discontinued due to hyperhidrosis. Local therapy for dry eyes somewhat effective. She has concerns about multiple systemic problems related including heart, lung, liver, circulatory, neuropathy. No GI exocrine function problem. Hydroxychloroquine considered pending blood test results for immune activation. Discussed hydroxychloroquine side effects including retinal toxicity. - Order blood tests:  sedimentation rate, C-reactive protein, complements, rheumatoid factor, immunoglobulin levels. - Provide information on Sjogren's syndrome and treatments. - Consider hydroxychloroquine if blood tests indicate immune activation. - Recommend lubricating eye drops currently systane, consider gel or ointment overnight, xiidra or f/u with ophthalmology - Advise humidifier use in bedroom - Discuss mouthwash for xerostomia.  Pulmonary hypertension Raynaud's phenomenon Raynaud's with acrocyanosis, possibly linked to Sjogren's and pulmonary hypertension. Tadalafil for pulmonary hypertension may alleviate symptoms. She has not started yet, I recommended to follow up with heart failure clinic regarding PA.  NASH (Nonalcoholic Steatohepatitis) with portal hypertension NASH with portal hypertension, risk of cirrhosis. Family history noted. No autoimmune hepatitis. Portal hypertension causing venous congestion and neuropathy. - Follow up with gastroenterologist in May.  Neuropathy Lower extremity neuropathy possibly related to portal hypertension. Symptoms include paresthesia. - Monitor symptoms, consider further evaluation if worsening.  Hip bursitis Hip bursitis previously relieved by dry needling. No particular indication for bursitis injection at this time. Can f/u with her PT if needed. Also provided hip exercises handout.   Orders: Orders Placed This Encounter  Procedures   Sedimentation rate   C-reactive protein   CBC with Differential/Platelet   COMPLETE METABOLIC PANEL WITH GFR   IgG, IgA, IgM   C3 and C4   No orders of the defined types were placed in this encounter.   Follow-Up Instructions: Return in about 3 months (around 10/12/2023) for New pt pSS ?HCQ trial f/u 3mos.   Fuller Plan, MD  Note - This record has been created using [employer].  Chart creation errors have been sought, but may not always  have been located. Such creation errors do not reflect on  the  standard of medical care.

## 2023-07-12 NOTE — Telephone Encounter (Addendum)
 Advanced Heart Failure Patient Advocate Encounter  Patient contacted pharmacy stating [facility] has been unable to process Tadalafil even after prior auth approval. Contacted pharmacy to confirm, they are unable to get a paid claim at this time. Unsure of why.   New rx sent to [facility] to be filled at [facility] for $0.  Burnell Blanks, CPhT Rx Patient Advocate Phone: [phone]

## 2023-07-12 NOTE — Addendum Note (Signed)
 Addended by: Lesta Limbert, Milagros Reap on: 07/12/2023 11:21 AM   Modules accepted: Orders

## 2023-07-13 DIAGNOSIS — J441 Chronic obstructive pulmonary disease with (acute) exacerbation: Secondary | ICD-10-CM | POA: Diagnosis not present

## 2023-07-13 DIAGNOSIS — J9601 Acute respiratory failure with hypoxia: Secondary | ICD-10-CM | POA: Diagnosis not present

## 2023-07-13 LAB — COMPLETE METABOLIC PANEL WITH GFR
AG Ratio: 1.1 (calc) (ref 1.0–2.5)
ALT: 11 U/L (ref 6–29)
AST: 28 U/L (ref 10–35)
Albumin: 3.2 g/dL — ABNORMAL LOW (ref 3.6–5.1)
Alkaline phosphatase (APISO): 64 U/L (ref 37–153)
BUN: 24 mg/dL (ref 7–25)
CO2: 32 mmol/L (ref 20–32)
Calcium: 8.8 mg/dL (ref 8.6–10.4)
Chloride: 105 mmol/L (ref 98–110)
Creat: 0.79 mg/dL (ref 0.60–1.00)
Globulin: 2.8 g/dL (ref 1.9–3.7)
Glucose, Bld: 80 mg/dL (ref 65–99)
Potassium: 4.4 mmol/L (ref 3.5–5.3)
Sodium: 143 mmol/L (ref 135–146)
Total Bilirubin: 1 mg/dL (ref 0.2–1.2)
Total Protein: 6 g/dL — ABNORMAL LOW (ref 6.1–8.1)
eGFR: 80 mL/min/{1.73_m2} (ref >=60)

## 2023-07-13 LAB — CBC WITH DIFFERENTIAL/PLATELET
Absolute Lymphocytes: 1561 {cells}/uL (ref 850–3900)
Absolute Monocytes: 406 {cells}/uL (ref 200–950)
Basophils Absolute: 10 {cells}/uL (ref 0–200)
Basophils Relative: 0.3 %
Eosinophils Absolute: 149 {cells}/uL (ref 15–500)
Eosinophils Relative: 4.5 %
HCT: 42.1 % (ref 35.0–45.0)
Hemoglobin: 13.2 g/dL (ref 11.7–15.5)
MCH: 29.8 pg (ref 27.0–33.0)
MCHC: 31.4 g/dL — ABNORMAL LOW (ref 32.0–36.0)
MCV: 95 fL (ref 80.0–100.0)
MPV: 12 fL (ref 7.5–12.5)
Monocytes Relative: 12.3 %
Neutro Abs: 1175 {cells}/uL — ABNORMAL LOW (ref 1500–7800)
Neutrophils Relative %: 35.6 %
Platelets: 70 10*3/uL — ABNORMAL LOW (ref 140–400)
RBC: 4.43 10*6/uL (ref 3.80–5.10)
RDW: 12 % (ref 11.0–15.0)
Total Lymphocyte: 47.3 %
WBC: 3.3 10*3/uL — ABNORMAL LOW (ref 3.8–10.8)

## 2023-07-13 LAB — C3 AND C4
C3 Complement: 89 mg/dL (ref 83–193)
C4 Complement: 14 mg/dL — ABNORMAL LOW (ref 15–57)

## 2023-07-13 LAB — IGG, IGA, IGM
IgG (Immunoglobin G), Serum: 1538 mg/dL (ref 600–1540)
IgM, Serum: 90 mg/dL (ref 50–300)
Immunoglobulin A: 447 mg/dL — ABNORMAL HIGH (ref 70–320)

## 2023-07-13 LAB — SEDIMENTATION RATE: Sed Rate: 6 mm/h (ref 0–30)

## 2023-07-13 LAB — C-REACTIVE PROTEIN: CRP: 5.1 mg/L (ref <8.0)

## 2023-07-15 ENCOUNTER — Ambulatory Visit: Payer: Medicare PPO

## 2023-07-15 DIAGNOSIS — I639 Cerebral infarction, unspecified: Secondary | ICD-10-CM

## 2023-07-15 LAB — CUP PACEART REMOTE DEVICE CHECK
Date Time Interrogation Session: 20250309231528
Implantable Pulse Generator Implant Date: 20231211

## 2023-07-15 NOTE — Progress Notes (Signed)
Kindly inform patient that carotid ultrasound study shows no significant narrowing of either carotid artery in the neck

## 2023-07-17 MED ORDER — HYDROXYCHLOROQUINE SULFATE 200 MG PO TABS: 200.0000 mg | ORAL_TABLET | Freq: Every day | ORAL | 2 refills | Status: DC

## 2023-07-17 NOTE — Addendum Note (Signed)
 Addended by: Fuller Plan on: 07/17/2023 09:28 AM   Modules accepted: Orders

## 2023-07-17 NOTE — Progress Notes (Signed)
 She continues to have low blood counts with 3.3 total white blood cells with 1175 neutrophils.  This is low but slightly better than her results from 3 months ago.  Platelet count of 70 is also low but better than result 3 months ago. Her other test are mildly abnormal with a high IgA of 447 and a slightly low complement C4 of 14.  The sedimentation rate is normal.  These are nonspecific but would be consistent with active Sjogren syndrome.  I recommend to try starting hydroxychloroquine 200 mg 1 tablet daily and we can follow-up to monitor symptom response and rechecking abnormal labs.

## 2023-07-18 NOTE — Progress Notes (Signed)
 Carelink Summary Report / Loop Recorder

## 2023-07-23 ENCOUNTER — Encounter: Payer: Self-pay | Admitting: Cardiovascular Disease

## 2023-07-23 ENCOUNTER — Encounter: Payer: Self-pay | Admitting: Internal Medicine

## 2023-07-25 ENCOUNTER — Telehealth: Payer: Self-pay

## 2023-07-25 NOTE — Telephone Encounter (Signed)
 Spoke with patient regarding my chart message for appointment with Dr. Eden Emms. Patient sent mychart message regarding scheduling appointment with Dr. Eden Emms being that her appointment was canceled due to the weather. Being that she is a former [employer] patient offered appointment with another provider. She is upset because she sates we assigned her to [facility] and he has prescribed her medicine. Apologized again and informed her that Dr. Eden Emms is booked and we would be happy to get her in with another gen card provider. She would like a message sent to to mychart to look up the providers.  Antonietta Breach RN will send her some providers so she can choose a new gen card provider.

## 2023-07-26 NOTE — Telephone Encounter (Signed)
 Patient agreeable to consolidate her care in the CHF clinic for now.  Follow up appt made 5/22 with Shirlee Latch  Aware she DOES NOT need a follow up appointment with Huel Coventry, or [facility] at this time. If general cardiology is needed in the future will place appropriate referral

## 2023-07-27 ENCOUNTER — Other Ambulatory Visit (HOSPITAL_COMMUNITY): Payer: Self-pay | Admitting: Cardiovascular Disease

## 2023-07-29 DIAGNOSIS — J441 Chronic obstructive pulmonary disease with (acute) exacerbation: Secondary | ICD-10-CM | POA: Diagnosis not present

## 2023-07-31 NOTE — Progress Notes (Incomplete)
 ***In Progress***    Advanced Heart Failure Clinic Note  PCP: Daisy Floro, MD HF Cardiology: Dr. Shirlee Latch  HPI:  68 y.o. with complicated history of Sjogren's syndrome, cirrhosis suspected to be due to NAFLD, rheumatic heart disease s/p bioprosthetic AVR, and pulmonary hypertension with RV failure was referred by Dr. Cristal Deer for evaluation of pulmonary hypertension/RV failure.  Patient was followed by Dr. Eldridge Dace in the past.  She was thought to have had rheumatic fever and developed severe aortic stenosis with mild mitral stenosis.  She had cath in 08/2017 showing minimal CAD then had bioprosthetic AVR in 2019.  She also has been found to have restrictive lung disease though most recent high resolution CT of chest in 04/2023 did not show a specific diagnosis (bronchial wall thickening, bibasilar atelectasis, chronic elevated right hemidiaphragm). She has been followed by rheumatology for Sjogren's syndrome and fibromyalgia.  She is followed by GI for cirrhosis thought to be due to NAFLD with varices and thrombocytopenia.    Echo in 03/2023 showed EF 65-70%, moderate LVH, PASP 68 mmHg, mildly decreased RV systolic function, mild MR, mild mitral stenosis with mean gradient 4.5 mmHg, bioprosthetic aortic valve with mean gradient 24 mmHg and EOA 1.45 cm^2 (suspect patient-prosthesis mismatch), dilated IVC.     She developed worsening dyspnea and peripheral edema and was admitted with CHF in 04/2023.  She was diuresed and was started on home oxygen at discharge.  RHC during this admission after diuresis showed elevated RA pressure with normal PCWP, moderate pulmonary arterial hypertension with relatively high cardiac output and PVR 4.1 WU suggesting a component of pulmonary arterial hypertension. She was seen by Dr. Shirlee Latch on 07/05/23 for follow-up of pulmonary HTN and RV failure. Weight was down 20 lbs, she was taking Lasix 40 mg daily. Endorsed ShOB when walking longer distances. Thought to be  primary group 1 PH. Initiated on tadalafil 20 mg daily and referred for pharmacy for continued titration. She was seen by Dr. Dimple Casey with rheumatology and was initiated on hydroxychloroquine 200 mg daily.    Today he returns to HF clinic for pharmacist medication titration. At last visit with MD, tadalafil was initiated at 20 mg daily.   Corlanor? Transferring to [facility] to fill for $0.  Last BP 104/70, HR 67 Baseline 335 m Side effects: HA, flushing,   Overall feeling ***. Dizziness, lightheadedness, fatigue:  Chest pain or palpitations:  How is your breathing?: *** SOB: Able to complete all ADLs. Activity level ***  Weight at home pounds. Takes furosemide/torsemide/bumex *** mg *** daily.  LEE PND/Orthopnea  Appetite *** Low-salt diet:   Physical Exam Cost/affordability of meds   HF Medications: Farxiga 10 mg daily Furosemide 40 mg (2x 20 mg tablets) daily  PH Medications: Tadalafil 20 mg daily  Has the patient been experiencing any side effects to the medications prescribed?  {YES NO:22349}  Does the patient have any problems obtaining medications due to transportation or finances?   {YES NO:22349}  Understanding of regimen: {excellent/good/fair/poor:19665} Understanding of indications: {excellent/good/fair/poor:19665} Potential of compliance: {excellent/good/fair/poor:19665} Patient understands to avoid NSAIDs. Patient understands to avoid decongestants.    Pertinent Lab Values: 07/12/23: Serum creatinine 0.79, BUN 24, Potassium 4.4, Sodium 143  Vital Signs: Weight: *** (last clinic weight: 218 lbs) Blood pressure: ***  Heart rate: ***   Assessment/Plan: 1. RV failure/cor pulmonale: Echo in 11/24 showed EF 65-70%, moderate LVH, PASP 68 mmHg, mildly decreased RV systolic function, mild MR, mild mitral stenosis with mean gradient 4.5 mmHg, bioprosthetic  aortic valve with mean gradient 24 mmHg and EOA 1.45 cm^2 (suspect patient-prosthesis mismatch), dilated IVC.   RHC in 12/24 showed elevated right-sided filling pressures in the setting of moderate pulmonary hypertension.  Volume status much improved today compared to last appointment, weight down 20 lbs.  NYHA class II.  - Continue Lasix 40 mg daily and KCl 20 daily.  - Continue Farxiga 10 daily.  - She is on nadolol for portal hypertension/varices. Need to be careful with this with RV failure, would not increase.   2. Pulmonary hypertension: Echo in 11/24 with PASP 68 and mild RV dysfunction.  RHC In 12/24 with PA 61/25 mean 40, PCWP 11, PVR 4.11 WU with CI 3.34. Suspect mixed pulmonary hypertension due to high output in the setting of cirrhosis + possible portopulmonary hypertension (group 1 PH).  Sjogren's syndrome itself can be associated with group 1 pulmonary hypertension as well.  Patient has chronic restrictive lung disease with elevated right hemidiaphragm though HRCT chest did not show interstitial lung disease or emphysema, suspect not primarily group 3 PH.  Though V/Q scan in 12/24 was concerning for chronic PEs, the CTA chest in 12/24 did not show any evidence for PE.  Overall, strong suspicion for group 1 PH from portopulmonary hypertension or Sjogren's syndrome. Baseline 6 minute walk done today.  - I will start her on tadalafil 20 mg daily today.  Would be careful using ERA given cirrhosis diagnosis.  - Followup with HF pharmacist in 3 wks to increase tadalafil to 40 mg daily, next step will like be Uptravi.  - Has OSA, should continue CPAP.   3. Chronic hypoxemic respiratory failure: She went home from the hospital in 12/24 on home oxygen.  HRCT chest showed elevated right hemidiaphragm with bronchial wall thickening and bibasilar scarring, but no ILD or emphysema.  She remains on home oxygen.  - Follows with pulmonary.   4. Atrial fibrillation: She has been in NSR recently, no palpitations.  - Continue apixaban 5 mg bid.   5. Cirrhosis: Suspected due to NAFLD.  Had chronic thrombocytopenia  and h/o varices.   - On nadolol for portal HTN/varices, would not increase with RV failure.   6. Rheumatic AS/MS: S/p bioprosthetic AVR.  Mitral stenosis was mild on last echo.  Echo in 11/24 showed elevated mean gradient across the aortic valve, 24 mmHg.  This was thought to be due to a degree of patient-prosthesis mismatch.    - Basic disease state pathophysiology, medication indication, mechanism and side effects reviewed at length with patient and he verbalized understanding  Follow up with Dr. Shirlee Latch on 09/24/23  Nils Pyle, PharmD PGY1 Pharmacy Resident  Karle Plumber, PharmD, BCPS, BCCP, CPP Heart Failure Clinic Pharmacist [phone]

## 2023-08-01 ENCOUNTER — Other Ambulatory Visit (HOSPITAL_COMMUNITY): Payer: Self-pay

## 2023-08-01 ENCOUNTER — Ambulatory Visit (HOSPITAL_COMMUNITY)
Admission: RE | Admit: 2023-08-01 | Discharge: 2023-08-01 | Disposition: A | Discharge status: Home / Self Care | Payer: Medicare PPO | Source: Ambulatory Visit | Attending: Cardiology | Admitting: Cardiology

## 2023-08-01 VITALS — BP 116/74 | HR 68 | Wt 222.2 lb

## 2023-08-01 DIAGNOSIS — Z7901 Long term (current) use of anticoagulants: Secondary | ICD-10-CM | POA: Diagnosis not present

## 2023-08-01 DIAGNOSIS — Z7984 Long term (current) use of oral hypoglycemic drugs: Secondary | ICD-10-CM | POA: Diagnosis not present

## 2023-08-01 DIAGNOSIS — K766 Portal hypertension: Secondary | ICD-10-CM | POA: Diagnosis not present

## 2023-08-01 DIAGNOSIS — I2781 Cor pulmonale (chronic): Secondary | ICD-10-CM | POA: Insufficient documentation

## 2023-08-01 DIAGNOSIS — Z9981 Dependence on supplemental oxygen: Secondary | ICD-10-CM | POA: Diagnosis not present

## 2023-08-01 DIAGNOSIS — J9611 Chronic respiratory failure with hypoxia: Secondary | ICD-10-CM | POA: Diagnosis not present

## 2023-08-01 DIAGNOSIS — M797 Fibromyalgia: Secondary | ICD-10-CM | POA: Diagnosis not present

## 2023-08-01 DIAGNOSIS — Z953 Presence of xenogenic heart valve: Secondary | ICD-10-CM | POA: Insufficient documentation

## 2023-08-01 DIAGNOSIS — I4891 Unspecified atrial fibrillation: Secondary | ICD-10-CM | POA: Insufficient documentation

## 2023-08-01 DIAGNOSIS — I272 Pulmonary hypertension, unspecified: Secondary | ICD-10-CM

## 2023-08-01 DIAGNOSIS — M3500 Sicca syndrome, unspecified: Secondary | ICD-10-CM | POA: Insufficient documentation

## 2023-08-01 DIAGNOSIS — Z79899 Other long term (current) drug therapy: Secondary | ICD-10-CM | POA: Diagnosis not present

## 2023-08-01 DIAGNOSIS — K7460 Unspecified cirrhosis of liver: Secondary | ICD-10-CM | POA: Insufficient documentation

## 2023-08-01 DIAGNOSIS — I2510 Atherosclerotic heart disease of native coronary artery without angina pectoris: Secondary | ICD-10-CM | POA: Diagnosis not present

## 2023-08-01 DIAGNOSIS — I050 Rheumatic mitral stenosis: Secondary | ICD-10-CM | POA: Diagnosis not present

## 2023-08-01 NOTE — Progress Notes (Signed)
 Advanced Heart Failure Clinic Note   PCP: Daisy Floro, MD HF Cardiology: Dr. Shirlee Latch  HPI:  68 y.o. with complicated history of Sjogren's syndrome, cirrhosis suspected to be due to NAFLD, rheumatic heart disease s/p bioprosthetic AVR, and pulmonary hypertension with RV failure was referred by Dr. Cristal Deer for evaluation of pulmonary hypertension/RV failure.  Patient was followed by Dr. Eldridge Dace in the past.  She was thought to have had rheumatic fever and developed severe aortic stenosis with mild mitral stenosis.  She had cath in 08/2017 showing minimal CAD then had bioprosthetic AVR in 2019.  She also has been found to have restrictive lung disease though most recent high resolution CT of chest in 04/2023 did not show a specific diagnosis (bronchial wall thickening, bibasilar atelectasis, chronic elevated right hemidiaphragm). She has been followed by rheumatology for Sjogren's syndrome and fibromyalgia.  She is followed by GI for cirrhosis thought to be due to NAFLD with varices and thrombocytopenia.    Echo in 03/2023 showed EF 65-70%, moderate LVH, PASP 68 mmHg, mildly decreased RV systolic function, mild MR, mild mitral stenosis with mean gradient 4.5 mmHg, bioprosthetic aortic valve with mean gradient 24 mmHg and EOA 1.45 cm^2 (suspect patient-prosthesis mismatch), dilated IVC.     She developed worsening dyspnea and peripheral edema and was admitted with CHF in 04/2023.  She was diuresed and was started on home oxygen at discharge.  RHC during this admission after diuresis showed elevated RA pressure with normal PCWP, moderate pulmonary arterial hypertension with relatively high cardiac output and PVR 4.1 WU suggesting a component of pulmonary arterial hypertension. She was seen by Dr. Shirlee Latch on 07/05/23 for follow-up of pulmonary HTN and RV failure. Weight was down 20 lbs, she was taking Lasix 40 mg daily. Endorsed SOB when walking longer distances. Thought to be primary group 1 PH.  Initiated on tadalafil 20 mg daily and referred for pharmacy for continued titration. She was seen by Dr. Dimple Casey with rheumatology and was initiated on hydroxychloroquine 200 mg daily.    Today she returns to HF clinic for pharmacist medication titration. At last visit with MD, tadalafil was initiated at 20 mg daily. Upon chart review, noted that prescription for tadalafil 40 mg (2x 20 mg tabs) daily was prescribed and dispensed from [facility] pharmacy (mail order). Patient reports feeling ok since last visit with Dr. Shirlee Latch. Feels she is tolerating tadalafil well - taking one tablet in the morning and one in the evening to reduce pill burden in the AM. Denies dizziness. Endorses fatigue if she over-exerts herself or goes too long without using oxygen. She states she is trying to self-wean from oxygen, and only use it when she knows she will be exerting herself- she continues to follow with pulmonology. Reports she is able to do chores around the house for about 1/2 day before feeling too fatigued. Denies CP or palpitations. She is still taking Lasix 40 mg daily, but reports feeling slightly more bloated. She reports some LE swelling - has mild non-pitting edema bilaterally. Denies PND, orthopnea. Uses CPAP machine overnight. Has an adjustable bed which the raises up - but states incline has not changed recently. Her appetite is ok, but she does get full quickly. Reports recently she has been having some indigestion/burping at night. Medications have been affordable - she requests to continue using [facility] mail order for tadalafil, since [facility] had issues filling the medication.   HF Medications: Farxiga 10 mg daily Lasix  40 mg (2x  20 mg tablets) daily  PH Medications: Tadalafil 40 mg daily - patient is taking one tablet (20 mg) in the morning and one (20 mg) in the evening  Has the patient been experiencing any side effects to the medications prescribed? - No  Does the patient have any problems obtaining medications  due to transportation or finances? - No; [employer]  Understanding of regimen: excellent Understanding of indications: excellent Potential of compliance: excellent Patient understands to avoid NSAIDs. Patient understands to avoid decongestants.   Pertinent Lab Values: 07/12/23: Serum creatinine 0.79, BUN 24, Potassium 4.4, Sodium 143  Vital Signs: Weight: 222.2 lbs (last clinic weight: 218 lbs) Blood pressure: 116/74 mmHg  Heart rate: 68   Assessment/Plan: 1. RV failure/cor pulmonale: Echo in 03/2023 showed EF 65-70%, moderate LVH, PASP 68 mmHg, mildly decreased RV systolic function, mild MR, mild mitral stenosis with mean gradient 4.5 mmHg, bioprosthetic aortic valve with mean gradient 24 mmHg and EOA 1.45 cm^2 (suspect patient-prosthesis mismatch), dilated IVC.  RHC in 04/2023 showed elevated right-sided filling pressures in the setting of moderate pulmonary hypertension.  NYHA class II-III. Volume status appears stable from previous, weight is up slightly (~4 lbs). Mild bilateral non-pitting LEE on exam. Appropriate to continue current regimen. - Continue Lasix 40 mg daily and KCl 20 daily.  - Continue Farxiga 10 daily.  - She is on nadolol for portal hypertension/varices. Need to be careful with this with RV failure, would not increase.   2. Pulmonary hypertension: Echo in 03/2023 with PASP 68 and mild RV dysfunction.  RHC In 04/2023 with PA 61/25 mean 40, PCWP 11, PVR 4.11 WU with CI 3.34. Suspect mixed pulmonary hypertension due to high output in the setting of cirrhosis + possible portopulmonary hypertension (group 1 PH).  Sjogren's syndrome itself can be associated with group 1 pulmonary hypertension as well.  Patient has chronic restrictive lung disease with elevated right hemidiaphragm though HRCT chest did not show interstitial lung disease or emphysema, suspect not primarily group 3 PH.  Though V/Q scan in 04/2023 was concerning for chronic PEs, the CTA chest in 04/2023 did not  show any evidence for PE.  Overall, strong suspicion for group 1 PH from portopulmonary hypertension or Sjogren's syndrome. Baseline 335 m. Patient appears to be tolerating tadalafil well at target dose, appropriate to continue. - Continue tadalafil 40 mg daily - instructed her she can continue taking 1 tablet (20 mg) in the AM and 1 tablet in the PM if she prefers   - Would be careful using ERA given cirrhosis diagnosis, can consider Uptravi as next step. - Has OSA, should continue CPAP.   3. Chronic hypoxemic respiratory failure: She went home from the hospital in 04/2023 on home oxygen.  HRCT chest showed elevated right hemidiaphragm with bronchial wall thickening and bibasilar scarring, but no ILD or emphysema.  She remains on home oxygen.  - Follows with pulmonary.   4. Atrial fibrillation: She has been in NSR recently, no palpitations.  - Continue apixaban 5 mg BID  5. Cirrhosis: Suspected due to NAFLD.  Had chronic thrombocytopenia and h/o varices.   - On nadolol for portal HTN/varices, would not increase with RV failure.   6. Rheumatic AS/MS: S/p bioprosthetic AVR.  Mitral stenosis was mild on last echo.  Echo in 03/2023 showed elevated mean gradient across the aortic valve, 24 mmHg.  This was thought to be due to a degree of patient-prosthesis mismatch.    Follow up with Dr. Shirlee Latch on 09/24/23  Nils Pyle, PharmD PGY1 Pharmacy Resident  Karle Plumber, PharmD, BCPS, BCCP, CPP Heart Failure Clinic Pharmacist [phone]

## 2023-08-01 NOTE — Patient Instructions (Signed)
 It was a pleasure seeing you today!  MEDICATIONS: -No medication changes today -Continue taking tadalafil 40 mg daily - you may take both tablets at one time, or separate them in the morning and evening if you prefer.  -Call if you have questions about your medications.  LABS: -We will call you if your labs need attention.  NEXT APPOINTMENT: Return to clinic in 2 mo with Dr. Shirlee Latch  In general, to take care of your heart failure: -Limit your fluid intake to 2 Liters (half-gallon) per day.   -Limit your salt intake to ideally 2-3 grams (2000-3000 mg) per day. -Weigh yourself daily and record, and bring that "weight diary" to your next appointment.  (Weight gain of 2-3 pounds in 1 day typically means fluid weight.) -The medications for your heart are to help your heart and help you live longer.   -Please contact us before stopping any of your heart medications.  Call the clinic at [phone] with questions or to reschedule future appointments.

## 2023-08-02 ENCOUNTER — Encounter: Payer: Self-pay | Admitting: Podiatry

## 2023-08-02 ENCOUNTER — Ambulatory Visit: Admitting: Podiatry

## 2023-08-02 DIAGNOSIS — E119 Type 2 diabetes mellitus without complications: Secondary | ICD-10-CM | POA: Diagnosis not present

## 2023-08-02 DIAGNOSIS — B351 Tinea unguium: Secondary | ICD-10-CM

## 2023-08-02 NOTE — Progress Notes (Signed)
 This patient returns to my office for at risk foot care.  This patient requires this care by a professional since this patient will be at risk due to having diabetic neuropathy.This patient is unable to cut nails herself since the patient cannot reach hernails.These nails are painful walking and wearing shoes.  This patient presents for at risk foot care today.  General Appearance  Alert, conversant and in no acute stress.  Vascular  Dorsalis pedis and posterior tibial  pulses are  weakly palpable  bilaterally.  Capillary return is within normal limits  bilaterally. Temperature is within normal limits  bilaterally.  Neurologic  Senn-Weinstein monofilament wire test absent  bilaterally. Muscle power within normal limits bilaterally.  Nails Thick disfigured discolored nails with subungual debris  hallux nails bilaterally. No evidence of bacterial infection or drainage bilaterally.  Orthopedic  No limitations of motion  feet .  No crepitus or effusions noted.  No bony pathology or digital deformities noted.  Skin  normotropic skin with no porokeratosis noted bilaterally.  No signs of infections or ulcers noted.     Onychomycosis  Pain in right toes  Pain in left toes  Consent was obtained for treatment procedures.   Mechanical debridement of nails 1-5  bilaterally performed with a nail nipper.  Filed with dremel without incident.    Return office visit   3 months                   Told patient to return for periodic foot care and evaluation due to potential at risk complications.   Helane Gunther DPM

## 2023-08-05 ENCOUNTER — Ambulatory Visit: Payer: Medicare PPO | Admitting: Internal Medicine

## 2023-08-06 ENCOUNTER — Other Ambulatory Visit: Payer: Self-pay

## 2023-08-12 DIAGNOSIS — G4733 Obstructive sleep apnea (adult) (pediatric): Secondary | ICD-10-CM | POA: Diagnosis not present

## 2023-08-12 DIAGNOSIS — J441 Chronic obstructive pulmonary disease with (acute) exacerbation: Secondary | ICD-10-CM | POA: Diagnosis not present

## 2023-08-13 DIAGNOSIS — J441 Chronic obstructive pulmonary disease with (acute) exacerbation: Secondary | ICD-10-CM | POA: Diagnosis not present

## 2023-08-13 DIAGNOSIS — J9601 Acute respiratory failure with hypoxia: Secondary | ICD-10-CM | POA: Diagnosis not present

## 2023-08-14 ENCOUNTER — Encounter: Payer: Self-pay | Admitting: Pharmacist Clinician (PhC)/ Clinical Pharmacy Specialist

## 2023-08-14 ENCOUNTER — Other Ambulatory Visit: Payer: Self-pay | Admitting: Pharmacist Clinician (PhC)/ Clinical Pharmacy Specialist

## 2023-08-14 DIAGNOSIS — E782 Mixed hyperlipidemia: Secondary | ICD-10-CM

## 2023-08-19 ENCOUNTER — Encounter: Payer: Self-pay | Admitting: Internal Medicine

## 2023-08-20 ENCOUNTER — Emergency Department (HOSPITAL_BASED_OUTPATIENT_CLINIC_OR_DEPARTMENT_OTHER)

## 2023-08-20 ENCOUNTER — Encounter (HOSPITAL_BASED_OUTPATIENT_CLINIC_OR_DEPARTMENT_OTHER): Payer: Self-pay | Admitting: Emergency Medicine

## 2023-08-20 ENCOUNTER — Emergency Department (HOSPITAL_BASED_OUTPATIENT_CLINIC_OR_DEPARTMENT_OTHER)
Admission: EM | Admit: 2023-08-20 | Discharge: 2023-08-20 | Disposition: A | Discharge status: Home / Self Care | Attending: Emergency Medicine | Admitting: Emergency Medicine

## 2023-08-20 ENCOUNTER — Other Ambulatory Visit: Payer: Self-pay

## 2023-08-20 ENCOUNTER — Ambulatory Visit: Payer: Medicare PPO | Admitting: Neurology

## 2023-08-20 DIAGNOSIS — Z7901 Long term (current) use of anticoagulants: Secondary | ICD-10-CM | POA: Diagnosis not present

## 2023-08-20 DIAGNOSIS — M7989 Other specified soft tissue disorders: Secondary | ICD-10-CM | POA: Diagnosis not present

## 2023-08-20 DIAGNOSIS — W540XXA Bitten by dog, initial encounter: Secondary | ICD-10-CM | POA: Diagnosis not present

## 2023-08-20 DIAGNOSIS — E119 Type 2 diabetes mellitus without complications: Secondary | ICD-10-CM | POA: Insufficient documentation

## 2023-08-20 DIAGNOSIS — S51852A Open bite of left forearm, initial encounter: Secondary | ICD-10-CM | POA: Insufficient documentation

## 2023-08-20 MED ORDER — OXYCODONE-ACETAMINOPHEN 5-325 MG PO TABS
1.0000 | ORAL_TABLET | Freq: Once | ORAL | Status: AC
  Administered 2023-08-20: 1 via ORAL
  Filled 2023-08-20: qty 1

## 2023-08-20 MED ORDER — OXYCODONE-ACETAMINOPHEN 5-325 MG PO TABS: 1.0000 | ORAL_TABLET | Freq: Four times a day (QID) | ORAL | 0 refills | Status: DC | PRN

## 2023-08-20 MED ORDER — AMOXICILLIN-POT CLAVULANATE 875-125 MG PO TABS: 1.0000 | ORAL_TABLET | Freq: Two times a day (BID) | ORAL | 0 refills | Status: DC

## 2023-08-20 NOTE — Discharge Instructions (Addendum)
 You sustained a dog bite to the left forearm.  X-ray imaging was negative for fracture or foreign body.  Your wound was cleaned and dressed by nursing staff.  Your tetanus is up-to-date and your dog's vaccinations are up-to-date.  Will discharge you on pain medication to take as needed, recommend cleaning your wounds with warm soapy water daily, dressing changes daily.  Wounds will need to heal by secondary intention as dog bites have a high chance of getting infected.  We will prescribe you a course of antibiotics to prevent this.  Return for any worsening signs of infection or other concerning signs which include spreading redness, worsening swelling, pain, decreased motor function of the hand, decreased sensation to the hand.

## 2023-08-20 NOTE — ED Provider Notes (Signed)
 [place] EMERGENCY DEPARTMENT AT [facility] Provider Note   CSN: [id number] Arrival date & time: 08/20/23  7829     History  Chief Complaint  Patient presents with   Animal Bite    Whitney [name] is a 68 y.o. female.   Animal Bite    68 year old female with medical history significant for Sjogren's syndrome, DM2, fibromyalgia, depression, GERD, history of aortic valve replacement, atrial fibrillation on Eliquis, TIA, NASH cirrhosis, pulmonary hypertension, obesity presenting to the emergency department after a dog bite.  The patient states that she was trying to grab a peep wrapper from her dog this morning when her dog tried to grab the wrapper back and accidentally bit her left forearm.  She states that she is on Eliquis and has not missed any doses of the medication.  She is up-to-date on her tetanus.  Her dog is up-to-date on vaccinations.  She is the owner of the dog.  She sustained multiple puncture wounds to the left forearm with associated pain and swelling and presented to the emergency department for further evaluation.  She denies any decreased sensation, she endorses pain and swelling, denies any decreased motor function.  Home Medications Prior to Admission medications   Medication Sig Start Date End Date Taking? Authorizing Provider  amoxicillin-clavulanate (AUGMENTIN) 875-125 MG tablet Take 1 tablet by mouth every 12 (twelve) hours. 08/20/23  Yes Ernie Avena, MD  oxyCODONE-acetaminophen (PERCOCET/ROXICET) 5-325 MG tablet Take 1 tablet by mouth every 6 (six) hours as needed for severe pain (pain score 7-10). 08/20/23  Yes Ernie Avena, MD  albuterol (PROVENTIL) (2.5 MG/3ML) 0.083% nebulizer solution Take 3 mLs (2.5 mg total) by nebulization every 6 (six) hours as needed for wheezing or shortness of breath. 03/12/23   Cobb, Ruby Cola, NP  albuterol (VENTOLIN HFA) 108 (90 Base) MCG/ACT inhaler INHALE 2 PUFFS INTO THE LUNGS EVERY 4 HOURS AS NEEDED FOR WHEEZING  OR SHORTNESS OF BREATH. 04/17/22   Olalere, Adewale A, MD  ELIQUIS 5 MG TABS tablet TAKE 1 TABLET BY MOUTH TWICE A DAY 06/27/23   Fenton, Clint R, PA  Evolocumab (REPATHA SURECLICK) 140 MG/ML SOAJ Inject 140 mg into the skin every 14 (fourteen) days. 05/23/23   Wendall Stade, MD  ezetimibe (ZETIA) 10 MG tablet Take 10 mg by mouth at bedtime.  09/17/16   [provider]  FARXIGA 10 MG TABS tablet Take 1 tablet (10 mg total) by mouth daily before breakfast. 04/23/23   Laurey Morale, MD  furosemide (LASIX) 20 MG tablet Take 2 tablets (40 mg total) by mouth daily. 04/23/23 07/04/24  Laurey Morale, MD  gabapentin (NEURONTIN) 100 MG capsule Take 1 capsule (100 mg total) by mouth 2 (two) times daily at 10 am and 4 pm AND 3 capsules (300 mg total) at bedtime. 01/31/23   Ihor Austin, NP  hydroxychloroquine (PLAQUENIL) 200 MG tablet Take 1 tablet (200 mg total) by mouth daily. 07/17/23   Rice, Jamesetta Orleans, MD  MAGNESIUM GLUCONATE PO Take 1 tablet by mouth daily.    [provider]  nadolol (CORGARD) 40 MG tablet TAKE 1 TABLET BY MOUTH EVERY DAY 07/29/23   Wendall Stade, MD  potassium chloride SA (KLOR-CON M) 20 MEQ tablet Take 1 tablet (20 mEq total) by mouth daily. 04/23/23   Laurey Morale, MD  Semaglutide,0.25 or 0.5MG /DOS, (OZEMPIC, 0.25 OR 0.5 MG/DOSE,) 2 MG/3ML SOPN Inject 0.5 mg into the skin once a week.    [provider]  tadalafil, PAH, (ADCIRCA) 20 MG tablet Take 2 tablets (40 mg total) by mouth daily. 07/12/23   Laurey Morale, MD  Tiotropium Bromide-Olodaterol (STIOLTO RESPIMAT) 2.5-2.5 MCG/ACT AERS INHALE 2 PUFFS INTO THE LUNGS DAILY. INHALE 2 PUFFS BY MOUTH INTO THE LUNGS DAILY 03/07/23   Leslye Peer, MD      Allergies    Prednisone, Doxycycline, and Naproxen    Review of Systems   Review of Systems  All other systems reviewed and are negative.   Physical Exam Updated Vital Signs BP 120/66 (BP Location: Right Arm)   Pulse 88   Temp 97.9 F  (36.6 C) (Oral)   Resp 18   SpO2 99%  Physical Exam Vitals and nursing note reviewed.  Constitutional:      General: She is not in acute distress. HENT:     Head: Normocephalic and atraumatic.  Eyes:     Conjunctiva/sclera: Conjunctivae normal.     Pupils: Pupils are equal, round, and reactive to light.  Cardiovascular:     Rate and Rhythm: Normal rate and regular rhythm.  Pulmonary:     Effort: Pulmonary effort is normal. No respiratory distress.  Abdominal:     General: There is no distension.     Tenderness: There is no guarding.  Musculoskeletal:        General: Swelling and signs of injury present.     Cervical back: Neck supple.     Comments: Left forearm: multiple puncture marks to the dorsum of the left forearm, surrounding ecchymosis and swelling.  Intact motor function along the median, ulnar, radial nerve distributions, 2+ radial pulses  Skin:    Findings: No lesion or rash.  Neurological:     General: No focal deficit present.     Mental Status: She is alert. Mental status is at baseline.      ED Results / Procedures / Treatments   Labs (all labs ordered are listed, but only abnormal results are displayed) Labs Reviewed - No data to display  EKG None  Radiology DG Forearm Left Result Date: 08/20/2023 CLINICAL DATA:  Dog bite left forearm EXAM: LEFT FOREARM - 2 VIEW COMPARISON:  None Available. FINDINGS: Frontal and lateral views of the left forearm are obtained on 3 images. There is marked soft tissue edema throughout the dorsum of the forearm, greatest distally. No fracture or radiopaque foreign body. Alignment is anatomic. IMPRESSION: 1. Diffuse soft tissue swelling. No fracture or radiopaque foreign body. Electronically Signed   By: Sharlet Salina M.D.   On: 08/20/2023 08:38    Procedures Procedures    Medications Ordered in ED Medications  oxyCODONE-acetaminophen (PERCOCET/ROXICET) 5-325 MG per tablet 1 tablet (1 tablet Oral Given 08/20/23 1610)     ED Course/ Medical Decision Making/ A&P                                 Medical Decision Making Amount and/or Complexity of Data Reviewed Radiology: ordered.  Risk Prescription drug management.    68 year old female with medical history significant for Sjogren's syndrome, DM2, fibromyalgia, depression, GERD, history of aortic valve replacement, atrial fibrillation on Eliquis, TIA, NASH cirrhosis, pulmonary hypertension, obesity presenting to the emergency department after a dog bite.  The patient states that she was trying to grab a peep wrapper from her dog this morning when her dog tried to grab the wrapper back and accidentally bit her left forearm.  She states that  she is on Eliquis and has not missed any doses of the medication.  She is up-to-date on her tetanus.  Her dog is up-to-date on vaccinations.  She is the owner of the dog.  She sustained multiple puncture wounds to the left forearm with associated pain and swelling and presented to the emergency department for further evaluation.  She denies any decreased sensation, she endorses pain and swelling, denies any decreased motor function.  On arrival, the patient was vitally stable.  Presenting with multiple puncture wounds and skin tears to the left forearm as per documentation above.  Patient up-to-date on tetanus and dog is up-to-date on vaccinations.  Advised the patient that wounds would need to heal by secondary intention and that we would be discharging her on antibiotics.  Will obtain x-ray imaging to evaluate for any foreign body or fracture underlying.  The patient is neurovascular intact in the left arm.  No concern for compartment syndrome.  XR left Forearm:  IMPRESSION:  1. Diffuse soft tissue swelling. No fracture or radiopaque foreign  body.    The patient's forearm was cleaned and dressed by nursing.  Augmentin was prescribed, Percocet was prescribed for breakthrough pain.  Patient stable for discharge with plan  for outpatient wound check in 1 week's time with her PCP, return precautions provided.   Final Clinical Impression(s) / ED Diagnoses Final diagnoses:  Dog bite, initial encounter    Rx / DC Orders ED Discharge Orders          Ordered    oxyCODONE-acetaminophen (PERCOCET/ROXICET) 5-325 MG tablet  Every 6 hours PRN        08/20/23 0824    amoxicillin-clavulanate (AUGMENTIN) 875-125 MG tablet  Every 12 hours        08/20/23 0824              Rosealee Concha, MD 08/20/23 [phone]

## 2023-08-20 NOTE — ED Triage Notes (Signed)
 Dog bite to left arm this morning. Patient is the owner of the dog. States dog is UTD on vaccinations. Unknown of tetanus status. Takes eliquis.

## 2023-08-21 ENCOUNTER — Telehealth: Payer: Self-pay | Admitting: Internal Medicine

## 2023-08-21 NOTE — Telephone Encounter (Signed)
 Patient called to follow-up on her device transmission and wants a call back to confirm transmission received.

## 2023-08-21 NOTE — Telephone Encounter (Signed)
 It looks like CV put a note in paceart but unsure if the transmission was exported. Can someone look into this?

## 2023-08-22 NOTE — Telephone Encounter (Signed)
 Spoke with pt and pt's son.pt complaining of brain fog and feeling "jet lagged" even though she has not been traveling.   Pt was recently seen in the ED for a dog bite and was prescribed an antibiotic which she states she has started.  Pt has not taken her medications this morning.  Current BP 151/89 with HR of 73.  Current BP 91 30 minutes after eating toast and 1-1/2 boiled eggs.  Pt states she does not think she ate last night.  Encouraged son to give pt some juice and maybe another piece of toast with peanut butter and continue to monitor her BS and BP.  Encouraged to contact PCP office for further concerns.  Pt verbalizes understanding and agrees with current plan.

## 2023-08-22 NOTE — Telephone Encounter (Signed)
 Spoke with pt and let her know we did get that transmission. Pt has concerns with her BP and is not feeling well

## 2023-08-24 ENCOUNTER — Other Ambulatory Visit: Payer: Self-pay

## 2023-08-24 ENCOUNTER — Emergency Department (HOSPITAL_COMMUNITY)

## 2023-08-24 ENCOUNTER — Encounter (HOSPITAL_COMMUNITY): Payer: Self-pay

## 2023-08-24 ENCOUNTER — Emergency Department (HOSPITAL_COMMUNITY)
Admission: EM | Admit: 2023-08-24 | Discharge: 2023-08-24 | Disposition: A | Discharge status: Home / Self Care | Source: Home / Self Care | Attending: Emergency Medicine | Admitting: Emergency Medicine

## 2023-08-24 DIAGNOSIS — W540XXD Bitten by dog, subsequent encounter: Secondary | ICD-10-CM | POA: Diagnosis not present

## 2023-08-24 DIAGNOSIS — E119 Type 2 diabetes mellitus without complications: Secondary | ICD-10-CM | POA: Diagnosis not present

## 2023-08-24 DIAGNOSIS — E782 Mixed hyperlipidemia: Secondary | ICD-10-CM | POA: Diagnosis not present

## 2023-08-24 DIAGNOSIS — J811 Chronic pulmonary edema: Secondary | ICD-10-CM | POA: Diagnosis not present

## 2023-08-24 DIAGNOSIS — I10 Essential (primary) hypertension: Secondary | ICD-10-CM | POA: Diagnosis not present

## 2023-08-24 DIAGNOSIS — E1142 Type 2 diabetes mellitus with diabetic polyneuropathy: Secondary | ICD-10-CM | POA: Diagnosis not present

## 2023-08-24 DIAGNOSIS — K7682 Hepatic encephalopathy: Secondary | ICD-10-CM | POA: Diagnosis present

## 2023-08-24 DIAGNOSIS — R918 Other nonspecific abnormal finding of lung field: Secondary | ICD-10-CM | POA: Diagnosis not present

## 2023-08-24 DIAGNOSIS — Z9189 Other specified personal risk factors, not elsewhere classified: Secondary | ICD-10-CM | POA: Diagnosis not present

## 2023-08-24 DIAGNOSIS — D6959 Other secondary thrombocytopenia: Secondary | ICD-10-CM | POA: Diagnosis present

## 2023-08-24 DIAGNOSIS — R579 Shock, unspecified: Secondary | ICD-10-CM | POA: Diagnosis not present

## 2023-08-24 DIAGNOSIS — I5031 Acute diastolic (congestive) heart failure: Secondary | ICD-10-CM | POA: Diagnosis not present

## 2023-08-24 DIAGNOSIS — N1831 Chronic kidney disease, stage 3a: Secondary | ICD-10-CM | POA: Diagnosis present

## 2023-08-24 DIAGNOSIS — J90 Pleural effusion, not elsewhere classified: Secondary | ICD-10-CM | POA: Diagnosis not present

## 2023-08-24 DIAGNOSIS — R0989 Other specified symptoms and signs involving the circulatory and respiratory systems: Secondary | ICD-10-CM | POA: Diagnosis not present

## 2023-08-24 DIAGNOSIS — R930 Abnormal findings on diagnostic imaging of skull and head, not elsewhere classified: Secondary | ICD-10-CM | POA: Diagnosis not present

## 2023-08-24 DIAGNOSIS — I272 Pulmonary hypertension, unspecified: Secondary | ICD-10-CM | POA: Diagnosis not present

## 2023-08-24 DIAGNOSIS — Z66 Do not resuscitate: Secondary | ICD-10-CM | POA: Diagnosis not present

## 2023-08-24 DIAGNOSIS — R4182 Altered mental status, unspecified: Secondary | ICD-10-CM | POA: Diagnosis present

## 2023-08-24 DIAGNOSIS — D696 Thrombocytopenia, unspecified: Secondary | ICD-10-CM | POA: Diagnosis not present

## 2023-08-24 DIAGNOSIS — Z515 Encounter for palliative care: Secondary | ICD-10-CM | POA: Diagnosis not present

## 2023-08-24 DIAGNOSIS — I130 Hypertensive heart and chronic kidney disease with heart failure and stage 1 through stage 4 chronic kidney disease, or unspecified chronic kidney disease: Secondary | ICD-10-CM | POA: Diagnosis present

## 2023-08-24 DIAGNOSIS — J9622 Acute and chronic respiratory failure with hypercapnia: Secondary | ICD-10-CM | POA: Diagnosis present

## 2023-08-24 DIAGNOSIS — R400 Somnolence: Secondary | ICD-10-CM | POA: Diagnosis not present

## 2023-08-24 DIAGNOSIS — K767 Hepatorenal syndrome: Secondary | ICD-10-CM | POA: Diagnosis not present

## 2023-08-24 DIAGNOSIS — I639 Cerebral infarction, unspecified: Secondary | ICD-10-CM | POA: Diagnosis not present

## 2023-08-24 DIAGNOSIS — J9690 Respiratory failure, unspecified, unspecified whether with hypoxia or hypercapnia: Secondary | ICD-10-CM | POA: Diagnosis not present

## 2023-08-24 DIAGNOSIS — I700 Atherosclerosis of aorta: Secondary | ICD-10-CM | POA: Diagnosis not present

## 2023-08-24 DIAGNOSIS — Z7189 Other specified counseling: Secondary | ICD-10-CM | POA: Diagnosis not present

## 2023-08-24 DIAGNOSIS — Z1152 Encounter for screening for COVID-19: Secondary | ICD-10-CM | POA: Diagnosis not present

## 2023-08-24 DIAGNOSIS — R188 Other ascites: Secondary | ICD-10-CM | POA: Diagnosis present

## 2023-08-24 DIAGNOSIS — J9601 Acute respiratory failure with hypoxia: Secondary | ICD-10-CM | POA: Diagnosis not present

## 2023-08-24 DIAGNOSIS — I5033 Acute on chronic diastolic (congestive) heart failure: Secondary | ICD-10-CM | POA: Diagnosis not present

## 2023-08-24 DIAGNOSIS — J4540 Moderate persistent asthma, uncomplicated: Secondary | ICD-10-CM | POA: Diagnosis present

## 2023-08-24 DIAGNOSIS — K7581 Nonalcoholic steatohepatitis (NASH): Secondary | ICD-10-CM | POA: Diagnosis not present

## 2023-08-24 DIAGNOSIS — J9811 Atelectasis: Secondary | ICD-10-CM | POA: Diagnosis not present

## 2023-08-24 DIAGNOSIS — K7469 Other cirrhosis of liver: Secondary | ICD-10-CM | POA: Diagnosis not present

## 2023-08-24 DIAGNOSIS — D631 Anemia in chronic kidney disease: Secondary | ICD-10-CM | POA: Diagnosis present

## 2023-08-24 DIAGNOSIS — K7460 Unspecified cirrhosis of liver: Secondary | ICD-10-CM | POA: Diagnosis not present

## 2023-08-24 DIAGNOSIS — I517 Cardiomegaly: Secondary | ICD-10-CM | POA: Diagnosis not present

## 2023-08-24 DIAGNOSIS — N179 Acute kidney failure, unspecified: Secondary | ICD-10-CM | POA: Diagnosis not present

## 2023-08-24 DIAGNOSIS — R531 Weakness: Secondary | ICD-10-CM | POA: Diagnosis not present

## 2023-08-24 DIAGNOSIS — Z4682 Encounter for fitting and adjustment of non-vascular catheter: Secondary | ICD-10-CM | POA: Diagnosis not present

## 2023-08-24 DIAGNOSIS — R079 Chest pain, unspecified: Secondary | ICD-10-CM | POA: Diagnosis not present

## 2023-08-24 DIAGNOSIS — Z452 Encounter for adjustment and management of vascular access device: Secondary | ICD-10-CM | POA: Diagnosis not present

## 2023-08-24 DIAGNOSIS — Z953 Presence of xenogenic heart valve: Secondary | ICD-10-CM | POA: Diagnosis not present

## 2023-08-24 DIAGNOSIS — R0902 Hypoxemia: Secondary | ICD-10-CM | POA: Diagnosis not present

## 2023-08-24 DIAGNOSIS — I447 Left bundle-branch block, unspecified: Secondary | ICD-10-CM | POA: Diagnosis not present

## 2023-08-24 DIAGNOSIS — J441 Chronic obstructive pulmonary disease with (acute) exacerbation: Secondary | ICD-10-CM | POA: Diagnosis not present

## 2023-08-24 DIAGNOSIS — D84821 Immunodeficiency due to drugs: Secondary | ICD-10-CM | POA: Diagnosis present

## 2023-08-24 DIAGNOSIS — I6782 Cerebral ischemia: Secondary | ICD-10-CM | POA: Diagnosis not present

## 2023-08-24 DIAGNOSIS — R569 Unspecified convulsions: Secondary | ICD-10-CM | POA: Diagnosis not present

## 2023-08-24 DIAGNOSIS — F32A Depression, unspecified: Secondary | ICD-10-CM | POA: Diagnosis present

## 2023-08-24 DIAGNOSIS — M3500 Sicca syndrome, unspecified: Secondary | ICD-10-CM | POA: Diagnosis present

## 2023-08-24 DIAGNOSIS — J449 Chronic obstructive pulmonary disease, unspecified: Secondary | ICD-10-CM | POA: Diagnosis not present

## 2023-08-24 DIAGNOSIS — K7290 Hepatic failure, unspecified without coma: Secondary | ICD-10-CM | POA: Diagnosis present

## 2023-08-24 DIAGNOSIS — Z7901 Long term (current) use of anticoagulants: Secondary | ICD-10-CM | POA: Diagnosis not present

## 2023-08-24 DIAGNOSIS — J9602 Acute respiratory failure with hypercapnia: Secondary | ICD-10-CM | POA: Diagnosis not present

## 2023-08-24 DIAGNOSIS — I6389 Other cerebral infarction: Secondary | ICD-10-CM | POA: Diagnosis not present

## 2023-08-24 DIAGNOSIS — J9621 Acute and chronic respiratory failure with hypoxia: Secondary | ICD-10-CM | POA: Diagnosis not present

## 2023-08-24 DIAGNOSIS — R0600 Dyspnea, unspecified: Secondary | ICD-10-CM | POA: Diagnosis not present

## 2023-08-24 DIAGNOSIS — I5030 Unspecified diastolic (congestive) heart failure: Secondary | ICD-10-CM | POA: Diagnosis not present

## 2023-08-24 DIAGNOSIS — J45909 Unspecified asthma, uncomplicated: Secondary | ICD-10-CM | POA: Insufficient documentation

## 2023-08-24 DIAGNOSIS — G9340 Encephalopathy, unspecified: Secondary | ICD-10-CM | POA: Diagnosis not present

## 2023-08-24 DIAGNOSIS — G4733 Obstructive sleep apnea (adult) (pediatric): Secondary | ICD-10-CM | POA: Diagnosis not present

## 2023-08-24 DIAGNOSIS — G9341 Metabolic encephalopathy: Secondary | ICD-10-CM | POA: Diagnosis not present

## 2023-08-24 DIAGNOSIS — R5383 Other fatigue: Secondary | ICD-10-CM | POA: Diagnosis not present

## 2023-08-24 DIAGNOSIS — I6340 Cerebral infarction due to embolism of unspecified cerebral artery: Secondary | ICD-10-CM | POA: Diagnosis present

## 2023-08-24 DIAGNOSIS — E1122 Type 2 diabetes mellitus with diabetic chronic kidney disease: Secondary | ICD-10-CM | POA: Diagnosis present

## 2023-08-24 DIAGNOSIS — I6523 Occlusion and stenosis of bilateral carotid arteries: Secondary | ICD-10-CM | POA: Diagnosis not present

## 2023-08-24 DIAGNOSIS — I480 Paroxysmal atrial fibrillation: Secondary | ICD-10-CM | POA: Diagnosis not present

## 2023-08-24 DIAGNOSIS — I4891 Unspecified atrial fibrillation: Secondary | ICD-10-CM | POA: Diagnosis not present

## 2023-08-24 DIAGNOSIS — I2721 Secondary pulmonary arterial hypertension: Secondary | ICD-10-CM | POA: Diagnosis present

## 2023-08-24 DIAGNOSIS — K6389 Other specified diseases of intestine: Secondary | ICD-10-CM | POA: Diagnosis not present

## 2023-08-24 DIAGNOSIS — W540XXA Bitten by dog, initial encounter: Secondary | ICD-10-CM | POA: Diagnosis not present

## 2023-08-24 LAB — I-STAT VENOUS BLOOD GAS, ED
Acid-Base Excess: 7 mmol/L — ABNORMAL HIGH (ref 0.0–2.0)
Bicarbonate: 35.3 mmol/L — ABNORMAL HIGH (ref 20.0–28.0)
Calcium, Ion: 1.16 mmol/L (ref 1.15–1.40)
HCT: 37 % (ref 36.0–46.0)
Hemoglobin: 12.6 g/dL (ref 12.0–15.0)
O2 Saturation: 99 %
Potassium: 5.2 mmol/L — ABNORMAL HIGH (ref 3.5–5.1)
Sodium: 139 mmol/L (ref 135–145)
TCO2: 37 mmol/L — ABNORMAL HIGH (ref 22–32)
pCO2, Ven: 69.4 mmHg — ABNORMAL HIGH (ref 44–60)
pH, Ven: 7.314 (ref 7.25–7.43)
pO2, Ven: 155 mmHg — ABNORMAL HIGH (ref 32–45)

## 2023-08-24 LAB — URINALYSIS, ROUTINE W REFLEX MICROSCOPIC
Bilirubin Urine: NEGATIVE
Glucose, UA: 50 mg/dL — AB
Ketones, ur: NEGATIVE mg/dL
Nitrite: NEGATIVE
Protein, ur: NEGATIVE mg/dL
Specific Gravity, Urine: 1.02 (ref 1.005–1.030)
pH: 5 (ref 5.0–8.0)

## 2023-08-24 LAB — CBC WITH DIFFERENTIAL/PLATELET
Abs Immature Granulocytes: 0.02 10*3/uL (ref 0.00–0.07)
Basophils Absolute: 0 10*3/uL (ref 0.0–0.1)
Basophils Relative: 0 %
Eosinophils Absolute: 0.1 10*3/uL (ref 0.0–0.5)
Eosinophils Relative: 2 %
HCT: 37.5 % (ref 36.0–46.0)
Hemoglobin: 11.8 g/dL — ABNORMAL LOW (ref 12.0–15.0)
Immature Granulocytes: 0 %
Lymphocytes Relative: 30 %
Lymphs Abs: 1.4 10*3/uL (ref 0.7–4.0)
MCH: 30.3 pg (ref 26.0–34.0)
MCHC: 31.5 g/dL (ref 30.0–36.0)
MCV: 96.4 fL (ref 80.0–100.0)
Monocytes Absolute: 0.6 10*3/uL (ref 0.1–1.0)
Monocytes Relative: 14 %
Neutro Abs: 2.5 10*3/uL (ref 1.7–7.7)
Neutrophils Relative %: 54 %
Platelets: 90 10*3/uL — ABNORMAL LOW (ref 150–400)
RBC: 3.89 MIL/uL (ref 3.87–5.11)
RDW: 14.7 % (ref 11.5–15.5)
WBC: 4.6 10*3/uL (ref 4.0–10.5)
nRBC: 0 % (ref 0.0–0.2)

## 2023-08-24 LAB — COMPREHENSIVE METABOLIC PANEL WITH GFR
ALT: 7 U/L (ref 0–44)
AST: 25 U/L (ref 15–41)
Albumin: 2.8 g/dL — ABNORMAL LOW (ref 3.5–5.0)
Alkaline Phosphatase: 46 U/L (ref 38–126)
Anion gap: 6 (ref 5–15)
BUN: 32 mg/dL — ABNORMAL HIGH (ref 8–23)
CO2: 29 mmol/L (ref 22–32)
Calcium: 8.6 mg/dL — ABNORMAL LOW (ref 8.9–10.3)
Chloride: 102 mmol/L (ref 98–111)
Creatinine, Ser: 1.03 mg/dL — ABNORMAL HIGH (ref 0.44–1.00)
GFR, Estimated: 58 mL/min — ABNORMAL LOW (ref >60)
Glucose, Bld: 138 mg/dL — ABNORMAL HIGH (ref 70–99)
Potassium: 4.6 mmol/L (ref 3.5–5.1)
Sodium: 137 mmol/L (ref 135–145)
Total Bilirubin: 1.2 mg/dL (ref 0.0–1.2)
Total Protein: 5.9 g/dL — ABNORMAL LOW (ref 6.5–8.1)

## 2023-08-24 LAB — TROPONIN I (HIGH SENSITIVITY)
Troponin I (High Sensitivity): 11 ng/L (ref <18)
Troponin I (High Sensitivity): 10 ng/L (ref <18)

## 2023-08-24 LAB — AMMONIA: Ammonia: 29 umol/L (ref 9–35)

## 2023-08-24 LAB — LIPASE, BLOOD: Lipase: 56 U/L — ABNORMAL HIGH (ref 11–51)

## 2023-08-24 MED ORDER — SODIUM CHLORIDE 0.9 % IV BOLUS
500.0000 mL | Freq: Once | INTRAVENOUS | Status: AC
  Administered 2023-08-24: 500 mL via INTRAVENOUS

## 2023-08-24 NOTE — Discharge Instructions (Addendum)
 If you develop headache, confusion, trouble breathing, or any other new/concerning symptoms then return to the ER.

## 2023-08-24 NOTE — ED Triage Notes (Signed)
 Pt BIB [facility] from home d/t generalized weakness for a week now. Pt is on 3L O2 at baseline for pulmonary hypertension. VSS. A&O X4.

## 2023-08-24 NOTE — ED Provider Notes (Signed)
 Care transferred to me.  Patient is awake and alert.  She is not altered on my exam.  UA does not show any obvious UTI and seems contaminated.  She also does not have any urine symptoms.  Her workup has been unremarkable including negative CT head and chest x-ray.  Multiple discussions with patient and son, will have her follow-up with her PCP and she needs to use her CPAP and oxygen .  Will discharge home with return precautions.   Jerilynn Montenegro, MD 08/24/23 [phone]

## 2023-08-24 NOTE — ED Provider Notes (Signed)
 Emergency Department Provider Note   I have reviewed the triage vital signs and the nursing notes.   HISTORY  Chief Complaint Weakness   HPI Whitney [name] is a 68 y.o. female with history reviewed below including cirrhosis, fibromyalgia, pulmonary hypertension presents to the emergency department with increased fatigue over the past 7 days.  She lives at home with her son who has noticed intermittent confusion with fatigue.  Patient has been sleeping often throughout the day which is unusual.  She was recently started on home oxygen  and CPAP but according to the son has been intermittently compliant with this.  No fevers or chills.  No vomiting or diarrhea.  Patient denies any chest discomfort or abdominal pain.  No unilateral weakness or numbness.  The son does notice that she seems more confused without her oxygen  and then seems to recover after restarting it.    Past Medical History:  Diagnosis Date   Allergic rhinitis    Anemia    hx   Arthritis    Asthma    hx yrs ago   Cirrhosis (HCC) last albumin  3.3 done at [facility] 06-16-2014 (under care everywhere tab in epic)   Secondary to Fatty liver --  followed by hepatology at [facility] (dr Orest Bio)    Depression    Diabetes mellitus type II    type 2 diet conrolled   Dyspnea    Fibromyalgia    GERD (gastroesophageal reflux disease)    Heart murmur    asymptomatic ---  1989 from rhuematic fever   History of exercise stress test    05-05-2013---  negative bruce ETT given exercise workload,  no ischemia   History of hiatal hernia    History of kidney stones    History of rheumatic fever    1989   Hyperlipidemia    Leukocytopenia    Moderate aortic stenosis    AVA area 1.1cm2---  cardiologist --  dr Armanda Lan, 2014 in epic   NASH (nonalcoholic steatohepatitis)    OSA (obstructive sleep apnea)    was using CPAP before gastric sleeve 2015--  no uses after wt loss   Pneumonia    hx   Pulmonary hypertension (HCC)    Sjogren's  syndrome (HCC)    Thrombocytopenia (HCC)     Review of Systems  Constitutional: No fever/chills. Positive fatigue.  Cardiovascular: Denies chest pain. Respiratory: Positive shortness of breath. Gastrointestinal: No abdominal pain.  No nausea, no vomiting.   Genitourinary: Negative for dysuria. Musculoskeletal: Negative for back pain. Skin: Negative for rash. Neurological: Negative for headaches.  ____________________________________________   PHYSICAL EXAM:  VITAL SIGNS: ED Triage Vitals  Encounter Vitals Group     BP 08/24/23 1128 109/65     Pulse Rate 08/24/23 1128 71     Resp 08/24/23 1128 19     Temp 08/24/23 1125 98.6 F (37 C)     Temp Source 08/24/23 1125 Oral     SpO2 08/24/23 1128 100 %   Constitutional: Alert and oriented. Well appearing and in no acute distress. Eyes: Conjunctivae are normal.  Head: Atraumatic. Nose: No congestion/rhinnorhea. Neck: No stridor.   Cardiovascular: Normal rate, regular rhythm. Good peripheral circulation. Grossly normal heart sounds.   Respiratory: Normal respiratory effort.  No retractions. Lungs CTAB. Gastrointestinal: Soft and nontender. No distention.  Musculoskeletal: No gross deformities of extremities. Neurologic:  Normal speech and language.  Skin:  Skin is warm, dry and intact. No rash noted.  ____________________________________________   Jolynn Needy (  all labs ordered are listed, but only abnormal results are displayed)  Labs Reviewed  CBC WITH DIFFERENTIAL/PLATELET - Abnormal; Notable for the following components:      Result Value   Hemoglobin 11.8 (*)    Platelets 90 (*)    All other components within normal limits  COMPREHENSIVE METABOLIC PANEL WITH GFR - Abnormal; Notable for the following components:   Glucose, Bld 138 (*)    BUN 32 (*)    Creatinine, Ser 1.03 (*)    Calcium  8.6 (*)    Total Protein 5.9 (*)    Albumin  2.8 (*)    GFR, Estimated 58 (*)    All other components within normal limits  LIPASE,  BLOOD - Abnormal; Notable for the following components:   Lipase 56 (*)    All other components within normal limits  URINALYSIS, ROUTINE W REFLEX MICROSCOPIC - Abnormal; Notable for the following components:   APPearance HAZY (*)    Glucose, UA 50 (*)    Hgb urine dipstick SMALL (*)    Leukocytes,Ua LARGE (*)    Bacteria, UA RARE (*)    Non Squamous Epithelial 0-5 (*)    All other components within normal limits  I-STAT VENOUS BLOOD GAS, ED - Abnormal; Notable for the following components:   pCO2, Ven 69.4 (*)    pO2, Ven 155 (*)    Bicarbonate 35.3 (*)    TCO2 37 (*)    Acid-Base Excess 7.0 (*)    Potassium 5.2 (*)    All other components within normal limits  AMMONIA  TROPONIN I (HIGH SENSITIVITY)  TROPONIN I (HIGH SENSITIVITY)   ____________________________________________  EKG   EKG Interpretation Date/Time:  Saturday August 24 2023 11:27:17 EDT Ventricular Rate:  73 PR Interval:  167 QRS Duration:  110 QT Interval:  413 QTC Calculation: 456 R Axis:   58  Text Interpretation: Sinus rhythm Anterior infarct, old Unchanged from prior Confirmed by Abby Hocking [phone]) on 08/24/2023 11:30:23 AM        ____________________________________________   PROCEDURES  Procedure(s) performed:   Procedures  None  ____________________________________________   INITIAL IMPRESSION / ASSESSMENT AND PLAN / ED COURSE  Pertinent labs & imaging results that were available during my care of the patient were reviewed by me and considered in my medical decision making (see chart for details).   This patient is Presenting for Evaluation of AMS, which does require a range of treatment options, and is a complaint that involves a high risk of morbidity and mortality.  The Differential Diagnoses includes but is not exclusive to alcohol , illicit or prescription medications, intracranial pathology such as stroke, intracerebral hemorrhage, fever or infectious causes including sepsis,  hypoxemia, uremia, trauma, endocrine related disorders such as diabetes, hypoglycemia, thyroid -related diseases, etc.   Critical Interventions-    Medications  sodium chloride  0.9 % bolus 500 mL (0 mLs Intravenous Stopped 08/24/23 1339)    Reassessment after intervention: symptoms improved.    I did obtain Additional Historical Information from son at bedside.  Clinical Laboratory Tests Ordered, included VBG shows pH of 7.3 with CO2 69. No AKI. Ammonia normal.   Radiologic Tests Ordered, included CXR. I independently interpreted the images and agree with radiology interpretation.   Cardiac Monitor Tracing which shows NSR.    Social Determinants of Health Risk patient is a non-smoker.   Medical Decision Making: Summary:  The patient presents emergency department with shortness of breath and intermittent confusion/fatigue at home.  It sounds like these episodes  seem to correlate with her not having her home oxygen  on or using her CPAP.  She is awake, alert, fully oriented here on her home oxygen .  Vital signs are within normal limits.  Labs are largely unremarkable with fairly mild CO2 retention.  Plan for chest x-ray along with CT head and reassess.  Reevaluation with update and discussion with patient. Care transferred to Dr. Aldean Amass pending CXR and CT head results.   Considered admission but imaging is pending.   Patient's presentation is most consistent with acute presentation with potential threat to life or bodily function.   Disposition: pending   ____________________________________________  FINAL CLINICAL IMPRESSION(S) / ED DIAGNOSES  Final diagnoses:  Sleepiness    Note:  This document was prepared using Dragon voice recognition software and may include unintentional dictation errors.  Abby Hocking, MD, [facility] Emergency Medicine    Chancie Lampert, Shereen Dike, MD 08/25/23 [phone]

## 2023-08-25 ENCOUNTER — Emergency Department (HOSPITAL_COMMUNITY)

## 2023-08-25 ENCOUNTER — Encounter (HOSPITAL_COMMUNITY): Payer: Self-pay | Admitting: Pharmacy Technician

## 2023-08-25 ENCOUNTER — Other Ambulatory Visit: Payer: Self-pay

## 2023-08-25 ENCOUNTER — Inpatient Hospital Stay (HOSPITAL_COMMUNITY)
Admission: EM | Admit: 2023-08-25 | Discharge: 2023-09-14 | DRG: 441 | Disposition: A | Discharge status: Hospice | Attending: Internal Medicine | Admitting: Internal Medicine

## 2023-08-25 DIAGNOSIS — I130 Hypertensive heart and chronic kidney disease with heart failure and stage 1 through stage 4 chronic kidney disease, or unspecified chronic kidney disease: Secondary | ICD-10-CM | POA: Diagnosis present

## 2023-08-25 DIAGNOSIS — D696 Thrombocytopenia, unspecified: Secondary | ICD-10-CM | POA: Diagnosis not present

## 2023-08-25 DIAGNOSIS — Z79899 Other long term (current) drug therapy: Secondary | ICD-10-CM

## 2023-08-25 DIAGNOSIS — J449 Chronic obstructive pulmonary disease, unspecified: Secondary | ICD-10-CM | POA: Diagnosis not present

## 2023-08-25 DIAGNOSIS — K7290 Hepatic failure, unspecified without coma: Secondary | ICD-10-CM | POA: Diagnosis present

## 2023-08-25 DIAGNOSIS — I5033 Acute on chronic diastolic (congestive) heart failure: Secondary | ICD-10-CM | POA: Diagnosis not present

## 2023-08-25 DIAGNOSIS — K7682 Hepatic encephalopathy: Principal | ICD-10-CM | POA: Diagnosis present

## 2023-08-25 DIAGNOSIS — I480 Paroxysmal atrial fibrillation: Secondary | ICD-10-CM | POA: Diagnosis not present

## 2023-08-25 DIAGNOSIS — M797 Fibromyalgia: Secondary | ICD-10-CM | POA: Diagnosis present

## 2023-08-25 DIAGNOSIS — J9621 Acute and chronic respiratory failure with hypoxia: Secondary | ICD-10-CM | POA: Diagnosis not present

## 2023-08-25 DIAGNOSIS — K7460 Unspecified cirrhosis of liver: Secondary | ICD-10-CM | POA: Diagnosis not present

## 2023-08-25 DIAGNOSIS — K766 Portal hypertension: Secondary | ICD-10-CM | POA: Diagnosis present

## 2023-08-25 DIAGNOSIS — E119 Type 2 diabetes mellitus without complications: Secondary | ICD-10-CM | POA: Diagnosis not present

## 2023-08-25 DIAGNOSIS — J4540 Moderate persistent asthma, uncomplicated: Secondary | ICD-10-CM | POA: Diagnosis present

## 2023-08-25 DIAGNOSIS — Z9981 Dependence on supplemental oxygen: Secondary | ICD-10-CM

## 2023-08-25 DIAGNOSIS — D84821 Immunodeficiency due to drugs: Secondary | ICD-10-CM | POA: Diagnosis present

## 2023-08-25 DIAGNOSIS — Z881 Allergy status to other antibiotic agents status: Secondary | ICD-10-CM

## 2023-08-25 DIAGNOSIS — L8990 Pressure ulcer of unspecified site, unspecified stage: Secondary | ICD-10-CM | POA: Insufficient documentation

## 2023-08-25 DIAGNOSIS — W540XXA Bitten by dog, initial encounter: Secondary | ICD-10-CM

## 2023-08-25 DIAGNOSIS — Z1152 Encounter for screening for COVID-19: Secondary | ICD-10-CM

## 2023-08-25 DIAGNOSIS — I493 Ventricular premature depolarization: Secondary | ICD-10-CM | POA: Diagnosis not present

## 2023-08-25 DIAGNOSIS — R680 Hypothermia, not associated with low environmental temperature: Secondary | ICD-10-CM | POA: Diagnosis not present

## 2023-08-25 DIAGNOSIS — K7469 Other cirrhosis of liver: Secondary | ICD-10-CM | POA: Diagnosis present

## 2023-08-25 DIAGNOSIS — F32A Depression, unspecified: Secondary | ICD-10-CM | POA: Diagnosis present

## 2023-08-25 DIAGNOSIS — J9622 Acute and chronic respiratory failure with hypercapnia: Secondary | ICD-10-CM | POA: Diagnosis present

## 2023-08-25 DIAGNOSIS — I2722 Pulmonary hypertension due to left heart disease: Secondary | ICD-10-CM | POA: Diagnosis present

## 2023-08-25 DIAGNOSIS — R579 Shock, unspecified: Secondary | ICD-10-CM | POA: Diagnosis not present

## 2023-08-25 DIAGNOSIS — E7800 Pure hypercholesterolemia, unspecified: Secondary | ICD-10-CM | POA: Diagnosis present

## 2023-08-25 DIAGNOSIS — M3500 Sicca syndrome, unspecified: Secondary | ICD-10-CM | POA: Diagnosis present

## 2023-08-25 DIAGNOSIS — E1122 Type 2 diabetes mellitus with diabetic chronic kidney disease: Secondary | ICD-10-CM | POA: Diagnosis present

## 2023-08-25 DIAGNOSIS — E876 Hypokalemia: Secondary | ICD-10-CM | POA: Diagnosis not present

## 2023-08-25 DIAGNOSIS — Z7985 Long-term (current) use of injectable non-insulin antidiabetic drugs: Secondary | ICD-10-CM

## 2023-08-25 DIAGNOSIS — Z953 Presence of xenogenic heart valve: Secondary | ICD-10-CM | POA: Diagnosis not present

## 2023-08-25 DIAGNOSIS — Z66 Do not resuscitate: Secondary | ICD-10-CM | POA: Diagnosis not present

## 2023-08-25 DIAGNOSIS — E11649 Type 2 diabetes mellitus with hypoglycemia without coma: Secondary | ICD-10-CM | POA: Diagnosis not present

## 2023-08-25 DIAGNOSIS — R29705 NIHSS score 5: Secondary | ICD-10-CM | POA: Diagnosis present

## 2023-08-25 DIAGNOSIS — Z8261 Family history of arthritis: Secondary | ICD-10-CM

## 2023-08-25 DIAGNOSIS — G9340 Encephalopathy, unspecified: Secondary | ICD-10-CM | POA: Diagnosis not present

## 2023-08-25 DIAGNOSIS — E870 Hyperosmolality and hypernatremia: Secondary | ICD-10-CM | POA: Diagnosis not present

## 2023-08-25 DIAGNOSIS — I272 Pulmonary hypertension, unspecified: Secondary | ICD-10-CM | POA: Diagnosis present

## 2023-08-25 DIAGNOSIS — Z515 Encounter for palliative care: Secondary | ICD-10-CM | POA: Diagnosis not present

## 2023-08-25 DIAGNOSIS — I4819 Other persistent atrial fibrillation: Secondary | ICD-10-CM | POA: Diagnosis present

## 2023-08-25 DIAGNOSIS — I6340 Cerebral infarction due to embolism of unspecified cerebral artery: Secondary | ICD-10-CM | POA: Diagnosis present

## 2023-08-25 DIAGNOSIS — E1142 Type 2 diabetes mellitus with diabetic polyneuropathy: Secondary | ICD-10-CM | POA: Diagnosis not present

## 2023-08-25 DIAGNOSIS — W540XXD Bitten by dog, subsequent encounter: Secondary | ICD-10-CM

## 2023-08-25 DIAGNOSIS — F05 Delirium due to known physiological condition: Secondary | ICD-10-CM | POA: Diagnosis not present

## 2023-08-25 DIAGNOSIS — I2721 Secondary pulmonary arterial hypertension: Secondary | ICD-10-CM | POA: Diagnosis present

## 2023-08-25 DIAGNOSIS — Z886 Allergy status to analgesic agent status: Secondary | ICD-10-CM

## 2023-08-25 DIAGNOSIS — N1831 Chronic kidney disease, stage 3a: Secondary | ICD-10-CM | POA: Diagnosis present

## 2023-08-25 DIAGNOSIS — I639 Cerebral infarction, unspecified: Secondary | ICD-10-CM | POA: Diagnosis not present

## 2023-08-25 DIAGNOSIS — T378X5A Adverse effect of other specified systemic anti-infectives and antiparasitics, initial encounter: Secondary | ICD-10-CM | POA: Diagnosis present

## 2023-08-25 DIAGNOSIS — K7581 Nonalcoholic steatohepatitis (NASH): Secondary | ICD-10-CM | POA: Diagnosis present

## 2023-08-25 DIAGNOSIS — D631 Anemia in chronic kidney disease: Secondary | ICD-10-CM | POA: Diagnosis present

## 2023-08-25 DIAGNOSIS — Z7189 Other specified counseling: Secondary | ICD-10-CM | POA: Diagnosis not present

## 2023-08-25 DIAGNOSIS — R4182 Altered mental status, unspecified: Secondary | ICD-10-CM | POA: Diagnosis present

## 2023-08-25 DIAGNOSIS — Z8673 Personal history of transient ischemic attack (TIA), and cerebral infarction without residual deficits: Secondary | ICD-10-CM

## 2023-08-25 DIAGNOSIS — Z8249 Family history of ischemic heart disease and other diseases of the circulatory system: Secondary | ICD-10-CM

## 2023-08-25 DIAGNOSIS — Z8619 Personal history of other infectious and parasitic diseases: Secondary | ICD-10-CM

## 2023-08-25 DIAGNOSIS — E66812 Obesity, class 2: Secondary | ICD-10-CM | POA: Diagnosis present

## 2023-08-25 DIAGNOSIS — G9081 Serotonin syndrome: Secondary | ICD-10-CM | POA: Diagnosis present

## 2023-08-25 DIAGNOSIS — E782 Mixed hyperlipidemia: Secondary | ICD-10-CM | POA: Diagnosis not present

## 2023-08-25 DIAGNOSIS — J9601 Acute respiratory failure with hypoxia: Secondary | ICD-10-CM | POA: Diagnosis not present

## 2023-08-25 DIAGNOSIS — I3481 Nonrheumatic mitral (valve) annulus calcification: Secondary | ICD-10-CM | POA: Diagnosis present

## 2023-08-25 DIAGNOSIS — G4733 Obstructive sleep apnea (adult) (pediatric): Secondary | ICD-10-CM | POA: Diagnosis not present

## 2023-08-25 DIAGNOSIS — K219 Gastro-esophageal reflux disease without esophagitis: Secondary | ICD-10-CM | POA: Diagnosis present

## 2023-08-25 DIAGNOSIS — Z9189 Other specified personal risk factors, not elsewhere classified: Secondary | ICD-10-CM | POA: Diagnosis not present

## 2023-08-25 DIAGNOSIS — Z7901 Long term (current) use of anticoagulants: Secondary | ICD-10-CM

## 2023-08-25 DIAGNOSIS — J9811 Atelectasis: Secondary | ICD-10-CM | POA: Diagnosis not present

## 2023-08-25 DIAGNOSIS — R531 Weakness: Secondary | ICD-10-CM | POA: Diagnosis not present

## 2023-08-25 DIAGNOSIS — R188 Other ascites: Secondary | ICD-10-CM | POA: Diagnosis present

## 2023-08-25 DIAGNOSIS — Z7984 Long term (current) use of oral hypoglycemic drugs: Secondary | ICD-10-CM

## 2023-08-25 DIAGNOSIS — I5083 High output heart failure: Secondary | ICD-10-CM | POA: Diagnosis present

## 2023-08-25 DIAGNOSIS — E1165 Type 2 diabetes mellitus with hyperglycemia: Secondary | ICD-10-CM | POA: Diagnosis present

## 2023-08-25 DIAGNOSIS — K767 Hepatorenal syndrome: Secondary | ICD-10-CM | POA: Diagnosis not present

## 2023-08-25 DIAGNOSIS — I5031 Acute diastolic (congestive) heart failure: Secondary | ICD-10-CM | POA: Diagnosis not present

## 2023-08-25 DIAGNOSIS — I6389 Other cerebral infarction: Secondary | ICD-10-CM | POA: Diagnosis not present

## 2023-08-25 DIAGNOSIS — S51852A Open bite of left forearm, initial encounter: Secondary | ICD-10-CM | POA: Diagnosis present

## 2023-08-25 DIAGNOSIS — Z87442 Personal history of urinary calculi: Secondary | ICD-10-CM

## 2023-08-25 DIAGNOSIS — Z820 Family history of epilepsy and other diseases of the nervous system: Secondary | ICD-10-CM

## 2023-08-25 DIAGNOSIS — I2723 Pulmonary hypertension due to lung diseases and hypoxia: Secondary | ICD-10-CM | POA: Diagnosis present

## 2023-08-25 DIAGNOSIS — J4489 Other specified chronic obstructive pulmonary disease: Secondary | ICD-10-CM | POA: Diagnosis present

## 2023-08-25 DIAGNOSIS — E785 Hyperlipidemia, unspecified: Secondary | ICD-10-CM | POA: Diagnosis present

## 2023-08-25 DIAGNOSIS — G9341 Metabolic encephalopathy: Secondary | ICD-10-CM | POA: Diagnosis not present

## 2023-08-25 DIAGNOSIS — D6959 Other secondary thrombocytopenia: Secondary | ICD-10-CM | POA: Diagnosis present

## 2023-08-25 DIAGNOSIS — R32 Unspecified urinary incontinence: Secondary | ICD-10-CM | POA: Diagnosis not present

## 2023-08-25 DIAGNOSIS — N179 Acute kidney failure, unspecified: Secondary | ICD-10-CM | POA: Diagnosis not present

## 2023-08-25 DIAGNOSIS — I5030 Unspecified diastolic (congestive) heart failure: Secondary | ICD-10-CM | POA: Diagnosis not present

## 2023-08-25 DIAGNOSIS — Z825 Family history of asthma and other chronic lower respiratory diseases: Secondary | ICD-10-CM

## 2023-08-25 DIAGNOSIS — R638 Other symptoms and signs concerning food and fluid intake: Secondary | ICD-10-CM

## 2023-08-25 DIAGNOSIS — I10 Essential (primary) hypertension: Secondary | ICD-10-CM | POA: Diagnosis not present

## 2023-08-25 DIAGNOSIS — Z9181 History of falling: Secondary | ICD-10-CM

## 2023-08-25 DIAGNOSIS — Z888 Allergy status to other drugs, medicaments and biological substances status: Secondary | ICD-10-CM

## 2023-08-25 DIAGNOSIS — I4892 Unspecified atrial flutter: Secondary | ICD-10-CM | POA: Diagnosis not present

## 2023-08-25 DIAGNOSIS — J9602 Acute respiratory failure with hypercapnia: Secondary | ICD-10-CM | POA: Diagnosis not present

## 2023-08-25 DIAGNOSIS — Z9884 Bariatric surgery status: Secondary | ICD-10-CM

## 2023-08-25 DIAGNOSIS — Z91198 Patient's noncompliance with other medical treatment and regimen for other reason: Secondary | ICD-10-CM

## 2023-08-25 DIAGNOSIS — R569 Unspecified convulsions: Secondary | ICD-10-CM | POA: Diagnosis not present

## 2023-08-25 DIAGNOSIS — R471 Dysarthria and anarthria: Secondary | ICD-10-CM | POA: Diagnosis present

## 2023-08-25 DIAGNOSIS — I8510 Secondary esophageal varices without bleeding: Secondary | ICD-10-CM | POA: Diagnosis present

## 2023-08-25 DIAGNOSIS — R112 Nausea with vomiting, unspecified: Secondary | ICD-10-CM | POA: Diagnosis not present

## 2023-08-25 DIAGNOSIS — Z6841 Body Mass Index (BMI) 40.0 and over, adult: Secondary | ICD-10-CM

## 2023-08-25 DIAGNOSIS — I4891 Unspecified atrial fibrillation: Secondary | ICD-10-CM | POA: Diagnosis not present

## 2023-08-25 LAB — I-STAT CHEM 8, ED
BUN: 29 mg/dL — ABNORMAL HIGH (ref 8–23)
Calcium, Ion: 1.2 mmol/L (ref 1.15–1.40)
Chloride: 101 mmol/L (ref 98–111)
Creatinine, Ser: 0.9 mg/dL (ref 0.44–1.00)
Glucose, Bld: 136 mg/dL — ABNORMAL HIGH (ref 70–99)
HCT: 36 % (ref 36.0–46.0)
Hemoglobin: 12.2 g/dL (ref 12.0–15.0)
Potassium: 4.8 mmol/L (ref 3.5–5.1)
Sodium: 140 mmol/L (ref 135–145)
TCO2: 30 mmol/L (ref 22–32)

## 2023-08-25 LAB — CBC
HCT: 37.8 % (ref 36.0–46.0)
Hemoglobin: 11.6 g/dL — ABNORMAL LOW (ref 12.0–15.0)
MCH: 30.7 pg (ref 26.0–34.0)
MCHC: 30.7 g/dL (ref 30.0–36.0)
MCV: 100 fL (ref 80.0–100.0)
Platelets: 92 10*3/uL — ABNORMAL LOW (ref 150–400)
RBC: 3.78 MIL/uL — ABNORMAL LOW (ref 3.87–5.11)
RDW: 15 % (ref 11.5–15.5)
WBC: 5.5 10*3/uL (ref 4.0–10.5)
nRBC: 0 % (ref 0.0–0.2)

## 2023-08-25 LAB — PROTIME-INR
INR: 1.8 — ABNORMAL HIGH (ref 0.8–1.2)
Prothrombin Time: 20.9 s — ABNORMAL HIGH (ref 11.4–15.2)

## 2023-08-25 LAB — COMPREHENSIVE METABOLIC PANEL WITH GFR
ALT: 7 U/L (ref 0–44)
AST: 21 U/L (ref 15–41)
Albumin: 2.7 g/dL — ABNORMAL LOW (ref 3.5–5.0)
Alkaline Phosphatase: 41 U/L (ref 38–126)
Anion gap: 6 (ref 5–15)
BUN: 23 mg/dL (ref 8–23)
CO2: 30 mmol/L (ref 22–32)
Calcium: 8.6 mg/dL — ABNORMAL LOW (ref 8.9–10.3)
Chloride: 103 mmol/L (ref 98–111)
Creatinine, Ser: 0.89 mg/dL (ref 0.44–1.00)
GFR, Estimated: >60 mL/min (ref >60)
Glucose, Bld: 143 mg/dL — ABNORMAL HIGH (ref 70–99)
Potassium: 4.9 mmol/L (ref 3.5–5.1)
Sodium: 139 mmol/L (ref 135–145)
Total Bilirubin: 1.1 mg/dL (ref 0.0–1.2)
Total Protein: 6 g/dL — ABNORMAL LOW (ref 6.5–8.1)

## 2023-08-25 LAB — DIFFERENTIAL
Abs Immature Granulocytes: 0.01 10*3/uL (ref 0.00–0.07)
Basophils Absolute: 0 10*3/uL (ref 0.0–0.1)
Basophils Relative: 1 %
Eosinophils Absolute: 0.1 10*3/uL (ref 0.0–0.5)
Eosinophils Relative: 2 %
Immature Granulocytes: 0 %
Lymphocytes Relative: 27 %
Lymphs Abs: 1.5 10*3/uL (ref 0.7–4.0)
Monocytes Absolute: 0.9 10*3/uL (ref 0.1–1.0)
Monocytes Relative: 17 %
Neutro Abs: 3 10*3/uL (ref 1.7–7.7)
Neutrophils Relative %: 53 %

## 2023-08-25 LAB — I-STAT VENOUS BLOOD GAS, ED
Acid-Base Excess: 3 mmol/L — ABNORMAL HIGH (ref 0.0–2.0)
Bicarbonate: 30.9 mmol/L — ABNORMAL HIGH (ref 20.0–28.0)
Calcium, Ion: 1.17 mmol/L (ref 1.15–1.40)
HCT: 40 % (ref 36.0–46.0)
Hemoglobin: 13.6 g/dL (ref 12.0–15.0)
O2 Saturation: 80 %
Potassium: 4.8 mmol/L (ref 3.5–5.1)
Sodium: 140 mmol/L (ref 135–145)
TCO2: 33 mmol/L — ABNORMAL HIGH (ref 22–32)
pCO2, Ven: 60 mmHg (ref 44–60)
pH, Ven: 7.319 (ref 7.25–7.43)
pO2, Ven: 50 mmHg — ABNORMAL HIGH (ref 32–45)

## 2023-08-25 LAB — ETHANOL: Alcohol, Ethyl (B): <10 mg/dL (ref <10)

## 2023-08-25 LAB — RESP PANEL BY RT-PCR (RSV, FLU A&B, COVID)  RVPGX2
Influenza A by PCR: NEGATIVE
Influenza B by PCR: NEGATIVE
Resp Syncytial Virus by PCR: NEGATIVE
SARS Coronavirus 2 by RT PCR: NEGATIVE

## 2023-08-25 LAB — MAGNESIUM: Magnesium: 2.2 mg/dL (ref 1.7–2.4)

## 2023-08-25 LAB — I-STAT CG4 LACTIC ACID, ED: Lactic Acid, Venous: 0.6 mmol/L (ref 0.5–1.9)

## 2023-08-25 LAB — AMMONIA: Ammonia: 23 umol/L (ref 9–35)

## 2023-08-25 LAB — APTT: aPTT: 41 s — ABNORMAL HIGH (ref 24–36)

## 2023-08-25 MED ORDER — MELATONIN 3 MG PO TABS
3.0000 mg | ORAL_TABLET | Freq: Every evening | ORAL | Status: DC | PRN
  Administered 2023-08-27 – 2023-09-03 (×3): 3 mg via ORAL
  Filled 2023-08-25 (×4): qty 1

## 2023-08-25 MED ORDER — ACETAMINOPHEN 325 MG PO TABS
650.0000 mg | ORAL_TABLET | Freq: Four times a day (QID) | ORAL | Status: DC | PRN
  Administered 2023-08-27 (×2): 650 mg via ORAL
  Filled 2023-08-25 (×2): qty 2

## 2023-08-25 MED ORDER — INSULIN ASPART 100 UNIT/ML IJ SOLN: 0.0000 [IU] | Freq: Three times a day (TID) | INTRAMUSCULAR | Status: DC

## 2023-08-25 MED ORDER — ACETAMINOPHEN 650 MG RE SUPP: 650.0000 mg | Freq: Four times a day (QID) | RECTAL | Status: DC | PRN

## 2023-08-25 MED ORDER — ALBUTEROL SULFATE (2.5 MG/3ML) 0.083% IN NEBU
2.5000 mg | INHALATION_SOLUTION | RESPIRATORY_TRACT | Status: DC | PRN
  Administered 2023-08-27 – 2023-08-29 (×2): 2.5 mg via RESPIRATORY_TRACT
  Filled 2023-08-25 (×3): qty 3

## 2023-08-25 MED ORDER — ONDANSETRON HCL 4 MG/2ML IJ SOLN
4.0000 mg | Freq: Four times a day (QID) | INTRAMUSCULAR | Status: DC | PRN
  Administered 2023-09-04 – 2023-09-14 (×4): 4 mg via INTRAVENOUS
  Filled 2023-08-25 (×6): qty 2

## 2023-08-25 MED ORDER — APIXABAN 5 MG PO TABS
5.0000 mg | ORAL_TABLET | Freq: Two times a day (BID) | ORAL | Status: DC
  Administered 2023-08-26 – 2023-09-08 (×25): 5 mg via ORAL
  Filled 2023-08-25 (×25): qty 1

## 2023-08-25 MED ORDER — NALOXONE HCL 0.4 MG/ML IJ SOLN: 0.4000 mg | INTRAMUSCULAR | Status: DC | PRN

## 2023-08-25 MED ORDER — EZETIMIBE 10 MG PO TABS
10.0000 mg | ORAL_TABLET | Freq: Every day | ORAL | Status: DC
  Administered 2023-08-26 – 2023-09-07 (×12): 10 mg via ORAL
  Filled 2023-08-25 (×12): qty 1

## 2023-08-25 MED ORDER — ARFORMOTEROL TARTRATE 15 MCG/2ML IN NEBU
15.0000 ug | INHALATION_SOLUTION | Freq: Two times a day (BID) | RESPIRATORY_TRACT | Status: DC
  Administered 2023-08-26 – 2023-09-10 (×31): 15 ug via RESPIRATORY_TRACT
  Filled 2023-08-25 (×31): qty 2

## 2023-08-25 MED ORDER — AMOXICILLIN-POT CLAVULANATE 875-125 MG PO TABS: 1.0000 | ORAL_TABLET | Freq: Two times a day (BID) | ORAL | Status: DC

## 2023-08-25 MED ORDER — UMECLIDINIUM BROMIDE 62.5 MCG/ACT IN AEPB
1.0000 | INHALATION_SPRAY | Freq: Every day | RESPIRATORY_TRACT | Status: DC
  Administered 2023-08-27 – 2023-08-31 (×5): 1 via RESPIRATORY_TRACT
  Filled 2023-08-25: qty 7

## 2023-08-25 MED ORDER — NADOLOL 40 MG PO TABS
40.0000 mg | ORAL_TABLET | Freq: Every day | ORAL | Status: DC
  Administered 2023-08-27 – 2023-09-01 (×6): 40 mg via ORAL
  Filled 2023-08-25 (×8): qty 1

## 2023-08-25 MED ORDER — FENTANYL CITRATE PF 50 MCG/ML IJ SOSY: 25.0000 ug | PREFILLED_SYRINGE | INTRAMUSCULAR | Status: DC | PRN

## 2023-08-25 NOTE — ED Provider Notes (Signed)
 [facility] Provider Note   CSN: [id number] Arrival date & time: 08/25/23  1729     History  Chief Complaint  Patient presents with   Altered Mental Status   Code Stroke    Whitney [name] is a 68 y.o. female.  Patient arrives as a code stroke.  Last known normal 1830 last night.  She was seen in the ED last night.  She has a history of cirrhosis, fibromyalgia, obstructive sleep apnea on chronic oxygen  now, diabetes pulmonary hypertension depression.  Sounds like she has been more slow to respond.  EMS thought maybe some generally weak but was kind of hard to get her to follow commands.  She has no specific complaints on exam she is slow to respond somewhat difficult to get a history from but can tell me name the year.  Sounds like no falls.  No chest pain no abdominal pain.  The history is provided by the patient and the EMS personnel.       Home Medications Prior to Admission medications   Medication Sig Start Date End Date Taking? Authorizing Provider  albuterol  (PROVENTIL ) (2.5 MG/3ML) 0.083% nebulizer solution Take 3 mLs (2.5 mg total) by nebulization every 6 (six) hours as needed for wheezing or shortness of breath. 03/12/23   Cobb, Mariah Shines, NP  albuterol  (VENTOLIN  HFA) 108 (90 Base) MCG/ACT inhaler INHALE 2 PUFFS INTO THE LUNGS EVERY 4 HOURS AS NEEDED FOR WHEEZING OR SHORTNESS OF BREATH. 04/17/22   Olalere, Ona Bidding A, MD  amoxicillin -clavulanate (AUGMENTIN ) 875-125 MG tablet Take 1 tablet by mouth every 12 (twelve) hours. 08/20/23   Rosealee Concha, MD  ELIQUIS  5 MG TABS tablet TAKE 1 TABLET BY MOUTH TWICE A DAY 06/27/23   Fenton, Clint R, PA  Evolocumab  (REPATHA  SURECLICK) 140 MG/ML SOAJ Inject 140 mg into the skin every 14 (fourteen) days. 05/23/23   Nishan, Peter C, MD  ezetimibe  (ZETIA ) 10 MG tablet Take 10 mg by mouth at bedtime.  09/17/16   [provider]  FARXIGA  10 MG TABS tablet Take 1 tablet (10 mg total) by mouth  daily before breakfast. 04/23/23   Darlis Eisenmenger, MD  furosemide  (LASIX ) 20 MG tablet Take 2 tablets (40 mg total) by mouth daily. 04/23/23 07/04/24  Darlis Eisenmenger, MD  gabapentin  (NEURONTIN ) 100 MG capsule Take 1 capsule (100 mg total) by mouth 2 (two) times daily at 10 am and 4 pm AND 3 capsules (300 mg total) at bedtime. 01/31/23   Johny Nap, NP  hydroxychloroquine  (PLAQUENIL ) 200 MG tablet Take 1 tablet (200 mg total) by mouth daily. 07/17/23   Matt Song, MD  MAGNESIUM  GLUCONATE PO Take 1 tablet by mouth daily.    [provider]  nadolol  (CORGARD ) 40 MG tablet TAKE 1 TABLET BY MOUTH EVERY DAY 07/29/23   Nishan, Peter C, MD  oxyCODONE -acetaminophen  (PERCOCET/ROXICET) 5-325 MG tablet Take 1 tablet by mouth every 6 (six) hours as needed for severe pain (pain score 7-10). 08/20/23   Rosealee Concha, MD  potassium chloride  SA (KLOR-CON  M) 20 MEQ tablet Take 1 tablet (20 mEq total) by mouth daily. 04/23/23   Darlis Eisenmenger, MD  Semaglutide,0.25 or 0.5MG /DOS, (OZEMPIC, 0.25 OR 0.5 MG/DOSE,) 2 MG/3ML SOPN Inject 0.5 mg into the skin once a week.    [provider]  tadalafil , PAH, (ADCIRCA ) 20 MG tablet Take 2 tablets (40 mg total) by mouth daily. 07/12/23   Darlis Eisenmenger, MD  Tiotropium  Bromide-Olodaterol (STIOLTO RESPIMAT ) 2.5-2.5 MCG/ACT AERS INHALE 2 PUFFS INTO THE LUNGS DAILY. INHALE 2 PUFFS BY MOUTH INTO THE LUNGS DAILY 03/07/23   Denson Flake, MD      Allergies    Prednisone , Doxycycline, and Naproxen    Review of Systems   Review of Systems  Physical Exam Updated Vital Signs BP 117/69   Pulse 72   Temp 99.9 F (37.7 C) (Oral)   Resp 19   Wt 105.2 kg   SpO2 97%   BMI 40.12 kg/m  Physical Exam Vitals and nursing note reviewed.  Constitutional:      General: She is not in acute distress.    Appearance: She is well-developed.     Comments: Chronically ill appearing   HENT:     Head: Normocephalic and atraumatic.     Nose: Nose normal.      Mouth/Throat:     Mouth: Mucous membranes are moist.  Eyes:     Extraocular Movements: Extraocular movements intact.     Conjunctiva/sclera: Conjunctivae normal.     Pupils: Pupils are equal, round, and reactive to light.  Cardiovascular:     Rate and Rhythm: Normal rate and regular rhythm.     Pulses: Normal pulses.     Heart sounds: Normal heart sounds. No murmur heard. Pulmonary:     Effort: Pulmonary effort is normal. No respiratory distress.     Breath sounds: Normal breath sounds.  Abdominal:     Palpations: Abdomen is soft.     Tenderness: There is no abdominal tenderness.  Musculoskeletal:        General: No swelling.     Cervical back: Normal range of motion and neck supple.  Skin:    General: Skin is warm and dry.     Capillary Refill: Capillary refill takes less than 2 seconds.     Comments: Well-healing wound to the left forearm  Neurological:     General: No focal deficit present.     Mental Status: She is alert.     Comments: Does not have any major focal deficits, appears to have clear speech, she is slow to answer questions, she moves all extremities visual fields appear to be intact  Psychiatric:        Mood and Affect: Mood normal.     ED Results / Procedures / Treatments   Labs (all labs ordered are listed, but only abnormal results are displayed) Labs Reviewed  PROTIME-INR - Abnormal; Notable for the following components:      Result Value   Prothrombin Time 20.9 (*)    INR 1.8 (*)    All other components within normal limits  APTT - Abnormal; Notable for the following components:   aPTT 41 (*)    All other components within normal limits  CBC - Abnormal; Notable for the following components:   RBC 3.78 (*)    Hemoglobin 11.6 (*)    Platelets 92 (*)    All other components within normal limits  COMPREHENSIVE METABOLIC PANEL WITH GFR - Abnormal; Notable for the following components:   Glucose, Bld 143 (*)    Calcium  8.6 (*)    Total Protein 6.0  (*)    Albumin  2.7 (*)    All other components within normal limits  I-STAT CHEM 8, ED - Abnormal; Notable for the following components:   BUN 29 (*)    Glucose, Bld 136 (*)    All other components within normal limits  RESP PANEL BY  RT-PCR (RSV, FLU A&B, COVID)  RVPGX2  ETHANOL  DIFFERENTIAL  RAPID URINE DRUG SCREEN, HOSP PERFORMED  URINALYSIS, ROUTINE W REFLEX MICROSCOPIC  AMMONIA  I-STAT VENOUS BLOOD GAS, ED  I-STAT CG4 LACTIC ACID, ED    EKG EKG Interpretation Date/Time:  Sunday August 25 2023 18:15:50 EDT Ventricular Rate:  73 PR Interval:  168 QRS Duration:  112 QT Interval:  404 QTC Calculation: 446 R Axis:   62  Text Interpretation: Sinus rhythm Anteroseptal infarct, old Confirmed by Lowery Rue [phone]) on 08/25/2023 6:22:10 PM  Radiology CT HEAD CODE STROKE WO CONTRAST Result Date: 08/25/2023 CLINICAL DATA:  Code stroke.  Dysphasia EXAM: CT HEAD WITHOUT CONTRAST TECHNIQUE: Contiguous axial images were obtained from the base of the skull through the vertex without intravenous contrast. RADIATION DOSE REDUCTION: This exam was performed according to the departmental dose-optimization program which includes automated exposure control, adjustment of the mA and/or kV according to patient size and/or use of iterative reconstruction technique. COMPARISON:  CT head without contrast 08/24/2023. MRI of the head without contrast 03/17/2023 FINDINGS: Brain: No acute infarct, hemorrhage, or mass lesion is present. Mild periventricular white matter changes are stable. A remote lacunar infarct is again noted in the left caudate head. The deep brain nuclei are otherwise within normal limits. No acute or focal cortical abnormalities are present. The ventricles are of normal size. No significant extraaxial fluid collection is present. An enlarged relatively empty sella is present. Midline structures are otherwise unremarkable. Vascular: Atherosclerotic calcifications are present within the  cavernous internal carotid arteries bilaterally. No hyperdense vessel is present. Skull: Calvarium is intact. No focal lytic or blastic lesions are present. No significant extracranial soft tissue lesion is present. Sinuses/Orbits: The paranasal sinuses and mastoid air cells are clear. The globes and orbits are within normal limits. Other: Diffuse fatty infiltration of the parotid glands is again noted bilaterally. Multiple punctate calcifications are present in both parotid glands. ASPECTS [facility] Stroke Program Early CT Score) - Ganglionic level infarction (caudate, lentiform nuclei, internal capsule, insula, M1-M3 cortex): 7/7 - Supraganglionic infarction (M4-M6 cortex): 3/3 Total score (0-10 with 10 being normal): 10/10 IMPRESSION: 1. No acute intracranial abnormality or significant interval change. 2. Stable mild periventricular white matter disease. This likely reflects the sequela of chronic microvascular ischemia. 3. Remote lacunar infarct of the left caudate head. 4. Aspects is 10/10. 5. Diffuse fatty infiltration of the parotid glands bilaterally with multiple punctate calcifications. This suggests Sjogren disease The above was relayed via text pager to Dr. Cleone Dad on 08/25/2023 at 17:48 . Electronically Signed   By: Audree Leas M.D.   On: 08/25/2023 17:48   DG Chest 2 View Result Date: 08/24/2023 CLINICAL DATA:  Chest pain. EXAM: CHEST - 2 VIEW COMPARISON:  10/25/2022. FINDINGS: Heart is enlarged and the mediastinal contour is within normal limits. Left atrial appendage clip and prosthetic valve are noted. Lung volumes are low resulting in vascular crowding. No consolidation, effusion, or pneumothorax. Sternotomy wires are present over the midline. Degenerative changes are seen in the thoracic spine. IMPRESSION: No active cardiopulmonary disease. Electronically Signed   By: Wyvonnia Heimlich M.D.   On: 08/24/2023 16:03   CT Head Wo Contrast Result Date: 08/24/2023 CLINICAL DATA:  Mental status  change, unknown cause. EXAM: CT HEAD WITHOUT CONTRAST TECHNIQUE: Contiguous axial images were obtained from the base of the skull through the vertex without intravenous contrast. RADIATION DOSE REDUCTION: This exam was performed according to the departmental dose-optimization program which includes automated exposure control, adjustment  of the mA and/or kV according to patient size and/or use of iterative reconstruction technique. COMPARISON:  03/17/2023. FINDINGS: Brain: No acute intracranial hemorrhage, midline shift or mass effect. No extra-axial fluid collection. Mild periventricular white matter hypodensities are present bilaterally. No hydrocephalus. Vascular: No hyperdense vessel or unexpected calcification. Skull: Normal. Negative for fracture or focal lesion. Sinuses/Orbits: There is partial opacification of the mastoid air cells bilaterally. Mucosal thickening is present in the maxillary sinuses. No acute orbital abnormality. Other: None. IMPRESSION: 1. No acute intracranial process. 2. Mild chronic microvascular ischemic changes. Electronically Signed   By: Wyvonnia Heimlich M.D.   On: 08/24/2023 15:59    Procedures Procedures    Medications Ordered in ED Medications - No data to display  ED Course/ Medical Decision Making/ A&P                                 Medical Decision Making Amount and/or Complexity of Data Reviewed Labs: ordered. Radiology: ordered.  Risk Decision regarding hospitalization.   Whitney COLLISTER is here as a code stroke.  History of fibromyalgia, fatty liver/cirrhosis, high cholesterol OSA on oxygen .  She comes in with last known normal 1830 last night.  Overall nonfocal exam here.  She is really just more slow to respond.  She is here yesterday for similar had unremarkable workup including head CT basic labs ammonia level urinalysis chest x-ray.  She does not really endorse any chest pain shortness of breath.  She had normal troponin yesterday.  Vital signs  reassuring here.  She was recently bit by dog on her left forearm which looks to be well-healing. Dr. Cleone Dad with neurology here upon arrival of the patient.  CT of the head was unremarkable.  Plan will likely be admitted for LTM to rule out any seizure type process.  Seems like she is having some inconsistent exams intermittent altered mental status issues here recently.  Given 2 normal CTs within 24 hours 1 hold on MRI at this time.  Will check basic labs reevaluate for metabolic process or infectious process but anticipate admission to medicine for further care and LTM.  Overall, lab works unremarkable.  No white count no lactic acidosis.  Electrolytes unremarkable.  Overall plan is for admission EEG in the morning MRI tonight, neurology to follow.  This chart was dictated using voice recognition software.  Despite best efforts to proofread,  errors can occur which can change the documentation meaning.         Final Clinical Impression(s) / ED Diagnoses Final diagnoses:  Altered mental status, unspecified altered mental status type    Rx / DC Orders ED Discharge Orders     None         Lowery Rue, DO 08/25/23 1919

## 2023-08-25 NOTE — ED Notes (Signed)
 Patient's family c/o patient not being able to move right arm.  Patient's arm assessed, and patient jerks arm away from touch stating "don't touch me,"  patient pulls against RN, but will not respond if her arm is hurting or not.  Patient's family denies falls.  MD Howerter made aware.

## 2023-08-25 NOTE — ED Notes (Addendum)
 Patient able to stand with 2 person assist and pivot to [facility].  Patient unable to follow commands to use restroom, but does state that she feels better since she got up and got repositioned into bed.  Patient no longer favoring right arm, and able to move right arm freely.

## 2023-08-25 NOTE — H&P (Incomplete)
 History and Physical      Whitney [name]:[id number] DOB: 1955/12/09 DOA: 08/25/2023; DOS: 08/25/2023  PCP: Jimmey Mould, MD  Patient coming from: home   I have personally briefly reviewed patient's old medical records in [facility]  Chief Complaint: Altered mental status  HPI: Whitney [name] is a 68 y.o. female with medical history significant for paroxysmal atrial fibrillation currently anticoagulated on Eliquis , NASH cirrhosis, type 2 diabetes mellitus companied by diabetic peripheral polyneuropathy, hyperlipidemia, aortic stenosis status post aortic valve replacement in August 2019, Sosan syndrome, moderate persistent asthma, thrombocytopenia with baseline platelet count 40s to 90s, who is admitted to [facility] on 08/25/2023 with acute encephalopathy after presenting from home to [facility] ED for evaluation of altered mental status.   In the setting of the patient's altered mental status, following history provided by patient's family in addition to my discussions with the EDP and via chart review.  The patient was bit by her dog on the left forearm on 08/20/2023, at which time she presented to the vascular emergency department for further evaluation and management thereof.  At that time, she was discharged to home on Augmentin  as well as prn Percocet.  He is making good compliance in the interval with her Augmentin  prn use of aforementioned Percocet.  Subsequently, over the last 2 days, family has noted the patient to be used to the baseline, somnolent, with generalized weakness in the absence of any acute focal weakness, the patient was intermittently slurring her speech.  He has not noted any tonic-clonic activity during her recent fall/trauma.  The patient was brought to the emergency department today, 08/24/2023 for further evaluation management of the above, at which time workup included CT head which showed no evidence of acute intracranial process.  Urinalysis was  obtained at that time and demonstrated 21-50 white blood cells, but was felt to be contaminated given concomitant presence of 11-20 squamous epithelial cells.  She was subsequent discharged to home.  However, in the absence of any significant interval improvement in her mental status, she was brought back to the emergency department this evening for further evaluation management thereof.  Her medical history is notable for paroxysmal atrial fibrillation which is chronically anticoagulated on Eliquis .  She also has a history of Florentina Huntsman cirrhosis, with most recent prior ammonia level 21 in November 2024.  Not on lactulose  as an outpatient.  This is complicated by chronic thrombocytopenia with baseline platelet counts 40s to 90s.  She has a history of serotonin syndrome for she has been prescribed hydroxychloroquine , but has not yet started on this medication.  Family conveys that the patient has no history of alcohol abuse nor any recreational drug use.    ED Course:  Vital signs in the ED were notable for the following: Afebrile; rates in the 60s to 70s; systolic pressures in the low 811B 140s; respiratory rate 16-23, oxygen  saturation 95 to 100% on room air.  Labs were notable for the following: VBG demonstrated the following: 7.319/60.  CMP was notable for the following: Sodium 139, bicarbonate 30, creatinine 0.89 compared to most recent prior value of 0.79 on 07/12/2023, glucose 143, calcium  adjusted for mild hypoalbuminemia noted to be 9.6, avidin 2.7, bilirubin 1.1.  Otherwise, liver enzymes were within normal limits.  Magnesium  level 2.2.  Lactic acid level 0.6.  CBC notable for Optipen 5500, hemoglobin 11.6, platelet count 92 compared to most recent prior platelet count of 70 on 07/12/2023.  INR 1.8, PTT 41.  Urinalysis and urinary drug screen were ordered today, with results currently pending.  COVID, flu Enza, RSV PCR all negative.  Per my interpretation, EKG in ED demonstrated the following: Sinus  rhythm with heart rate 73, normal intervals, no evidence of T wave or ST changes, including no evidence of ST elevation.  Imaging in the ED, per corresponding formal radiology read, was notable for the following: 1 view chest x-ray showed cardiomegaly with pulmonary vascular congestion but no evidence of interstitial or pulmonary edema or any evidence of infiltrate, pleural effusion, or pneumothorax.  EDP discussed patient's case with on-call neurology, Dr. Cleone Dad, who conveyed that neurology will formally consult.  Neurology recommends MRI brain and also anticipates pursuing EEG tomorrow when this service is available (4/21).   While in the ED, the following were administered: Noncontrast CT head showed no evidence of acute intracranial process, Cleen evidence of intracranial or chance of acute infarct, and appeared without significant interval change relative to CT head performed yesterday, 08/24/2023.  MRI brain has been ordered, with results pending.  Subsequently, the patient was admitted for further evaluation management of presenting acute encephalopathy.     Review of Systems: As per HPI otherwise 10 point review of systems negative.   Past Medical History:  Diagnosis Date   Allergic rhinitis    Anemia    hx   Arthritis    Asthma    hx yrs ago   Cirrhosis (HCC) last albumin  3.3 done at [facility] 06-16-2014 (under care everywhere tab in epic)   Secondary to Fatty liver --  followed by hepatology at [facility] (dr Orest Bio)    Depression    Diabetes mellitus type II    type 2 diet conrolled   Dyspnea    Fibromyalgia    GERD (gastroesophageal reflux disease)    Heart murmur    asymptomatic ---  1989 from rhuematic fever   History of exercise stress test    05-05-2013---  negative bruce ETT given exercise workload,  no ischemia   History of hiatal hernia    History of kidney stones    History of rheumatic fever    1989   Hyperlipidemia    Leukocytopenia    Moderate aortic stenosis     AVA area 1.1cm2---  cardiologist --  dr Armanda Lan, 2014 in epic   NASH (nonalcoholic steatohepatitis)    OSA (obstructive sleep apnea)    was using CPAP before gastric sleeve 2015--  no uses after wt loss   Pneumonia    hx   Pulmonary hypertension (HCC)    Sjogren's syndrome (HCC)    Thrombocytopenia (HCC)     Past Surgical History:  Procedure Laterality Date   AORTIC VALVE REPLACEMENT N/A 12/05/2017   Procedure: AORTIC VALVE REPLACEMENT (AVR) TISSUE VALVE INSPIRIS;  Surgeon: Bartley Lightning, MD;  Location: [facility] OR;  Service: Open Heart Surgery;  Laterality: N/A;   COLONOSCOPY WITH ESOPHAGOGASTRODUODENOSCOPY (EGD)     CYSTOSCOPY WITH RETROGRADE PYELOGRAM, URETEROSCOPY AND STENT PLACEMENT Left 01/21/2015   Procedure: CYSTOSCOPY WITH LEFT  RETROGRADE PYELOGRAM, LEFT URETEROSCOPY AND STENT PLACEMENT;  Surgeon: Christina Coyer, MD;  Location: [facility];  Service: Urology;  Laterality: Left;   CYSTOSCOPY WITH RETROGRADE PYELOGRAM, URETEROSCOPY AND STENT PLACEMENT Bilateral 02/24/2015   Procedure: CYSTOSCOPY WITH RIGHT RETROGRADE PYELOGRAM, BLADDER BIOPSY FULGERATION LEFT URETEROSCOPY AND STENT REPLACEMENT;  Surgeon: Christina Coyer, MD;  Location: [facility];  Service: Urology;  Laterality: Bilateral;   EXPLORATORY LAPAROSCOPY W/  CONE  BIOPSY'S LEFT AND RIGHT LOBE OF LIVER  11-04-2007   HOLMIUM LASER APPLICATION Left 02/24/2015   Procedure: HOLMIUM LASER LITHOTRIPSY;  Surgeon: Christina Coyer, MD;  Location: [facility];  Service: Urology;  Laterality: Left;   HYSTEROSCOPY WITH D & C N/A 12/11/2012   Procedure: DILATATION AND CURETTAGE /HYSTEROSCOPY;  Surgeon: Lizette Righter. Wynona Hedger, MD;  Location: [facility];  Service: Gynecology;  Laterality: N/A;   INGUINAL HERNIA REPAIR Left 10/22/2016   Procedure: LEFT INGUINAL HERNIA REPAIR;  Surgeon: Enid Harry, MD;  Location: [facility] OR;  Service: General;  Laterality: Left;  TAP BLOCK   INSERTION OF MESH Left  10/22/2016   Procedure: INSERTION OF MESH;  Surgeon: Enid Harry, MD;  Location: [facility] OR;  Service: General;  Laterality: Left;   LAPAROSCOPIC GASTRIC SLEEVE RESECTION  07-27-2013   LOOP RECORDER INSERTION N/A 04/16/2022   Procedure: LOOP RECORDER INSERTION;  Surgeon: Verona Goodwill, MD;  Location: [facility] INVASIVE CV LAB;  Service: Cardiovascular;  Laterality: N/A;   PUBOVAGINAL SLING  04-10-2001   [facility]   RIGHT HEART CATH N/A 04/11/2023   Procedure: RIGHT HEART CATH;  Surgeon: Darlis Eisenmenger, MD;  Location: [facility] INVASIVE CV LAB;  Service: Cardiovascular;  Laterality: N/A;   RIGHT/LEFT HEART CATH AND CORONARY ANGIOGRAPHY N/A 08/23/2017   Procedure: RIGHT/LEFT HEART CATH AND CORONARY ANGIOGRAPHY;  Surgeon: Lucendia Rusk, MD;  Location: [facility] INVASIVE CV LAB;  Service: Cardiovascular;  Laterality: N/A;   TEE WITHOUT CARDIOVERSION N/A 12/05/2017   Procedure: TRANSESOPHAGEAL ECHOCARDIOGRAM (TEE);  Surgeon: Bartley Lightning, MD;  Location: [facility] OR;  Service: Open Heart Surgery;  Laterality: N/A;   TONSILLECTOMY  1975   TRANSTHORACIC ECHOCARDIOGRAM  06-04-2012  dr Jacquelynn Matter   mild LVH,  grade I diastolic dysfunction/  ef 60-65%/  moderate LAE/  mild MV calcifation without stenosis,  mild MR/  moderate AV stenosis,  cannot r/o bicupsid, area 1.1cm2/  mild dilated aortic root/  trivial TR    Social History:  reports that she has never smoked. She has been exposed to tobacco smoke. She has never used smokeless tobacco. She reports that she does not currently use alcohol. She reports that she does not use drugs.   Allergies  Allergen Reactions   Prednisone      Other Reaction(s): delerium   Doxycycline Hives and Rash   Naproxen Rash and Hives    Family History  Problem Relation Age of Onset   Alzheimer's disease Father    Hip fracture Father    Asthma Father    Heart disease Father    Heart attack Father    Hypertension Father    Rheum arthritis Mother    Heart disease Mother    Allergies  Daughter    Asthma Paternal Grandmother    Asthma Daughter    Stroke Neg Hx     Family history reviewed and not pertinent    Prior to Admission medications   Medication Sig Start Date End Date Taking? Authorizing Provider  albuterol  (PROVENTIL ) (2.5 MG/3ML) 0.083% nebulizer solution Take 3 mLs (2.5 mg total) by nebulization every 6 (six) hours as needed for wheezing or shortness of breath. 03/12/23   Cobb, Mariah Shines, NP  albuterol  (VENTOLIN  HFA) 108 (90 Base) MCG/ACT inhaler INHALE 2 PUFFS INTO THE LUNGS EVERY 4 HOURS AS NEEDED FOR WHEEZING OR SHORTNESS OF BREATH. 04/17/22   Myer Artis A, MD  amoxicillin -clavulanate (AUGMENTIN ) 875-125 MG tablet Take 1 tablet by mouth every 12 (twelve) hours. 08/20/23   Rosealee Concha,  MD  ELIQUIS  5 MG TABS tablet TAKE 1 TABLET BY MOUTH TWICE A DAY 06/27/23   Fenton, Clint R, PA  Evolocumab  (REPATHA  SURECLICK) 140 MG/ML SOAJ Inject 140 mg into the skin every 14 (fourteen) days. 05/23/23   Nishan, Peter C, MD  ezetimibe  (ZETIA ) 10 MG tablet Take 10 mg by mouth at bedtime.  09/17/16   [provider]  FARXIGA  10 MG TABS tablet Take 1 tablet (10 mg total) by mouth daily before breakfast. 04/23/23   Darlis Eisenmenger, MD  furosemide  (LASIX ) 20 MG tablet Take 2 tablets (40 mg total) by mouth daily. 04/23/23 07/04/24  Darlis Eisenmenger, MD  gabapentin  (NEURONTIN ) 100 MG capsule Take 1 capsule (100 mg total) by mouth 2 (two) times daily at 10 am and 4 pm AND 3 capsules (300 mg total) at bedtime. 01/31/23   Johny Nap, NP  hydroxychloroquine  (PLAQUENIL ) 200 MG tablet Take 1 tablet (200 mg total) by mouth daily. 07/17/23   Matt Song, MD  MAGNESIUM  GLUCONATE PO Take 1 tablet by mouth daily.    [provider]  nadolol  (CORGARD ) 40 MG tablet TAKE 1 TABLET BY MOUTH EVERY DAY 07/29/23   Nishan, Peter C, MD  oxyCODONE -acetaminophen  (PERCOCET/ROXICET) 5-325 MG tablet Take 1 tablet by mouth every 6 (six) hours as needed for severe pain (pain score  7-10). 08/20/23   Rosealee Concha, MD  potassium chloride  SA (KLOR-CON  M) 20 MEQ tablet Take 1 tablet (20 mEq total) by mouth daily. 04/23/23   Darlis Eisenmenger, MD  Semaglutide,0.25 or 0.5MG /DOS, (OZEMPIC, 0.25 OR 0.5 MG/DOSE,) 2 MG/3ML SOPN Inject 0.5 mg into the skin once a week.    [provider]  tadalafil , PAH, (ADCIRCA ) 20 MG tablet Take 2 tablets (40 mg total) by mouth daily. 07/12/23   Darlis Eisenmenger, MD  Tiotropium Bromide-Olodaterol (STIOLTO RESPIMAT ) 2.5-2.5 MCG/ACT AERS INHALE 2 PUFFS INTO THE LUNGS DAILY. INHALE 2 PUFFS BY MOUTH INTO THE LUNGS DAILY 03/07/23   Denson Flake, MD     Objective    Physical Exam: Vitals:   08/25/23 1830 08/25/23 1845 08/25/23 1900 08/25/23 1915  BP: 113/60 (!) 102/59 115/61 117/69  Pulse: 72 72 72 72  Resp: (!) 31 (!) 28 (!) 26 19  Temp:      TempSrc:      SpO2: 100% 100% 100% 97%  Weight:        General: appears to be stated age; somnolent, confused Skin: warm, dry, no rash Head:  AT/[place] Mouth:  Oral mucosa membranes appear dry, normal dentition Neck: supple; trachea midline Heart:  RRR; did not appreciate any M/R/G Lungs: CTAB, did not appreciate any wheezes, rales, or rhonchi Abdomen: + BS; soft, ND, NT Vascular: 2+ pedal pulses b/l; 2+ radial pulses b/l Extremities: no peripheral edema, no muscle wasting; healing bit wounds on posterior aspect of left forearm without surrounding erythema or crepitus. Neuro: in the setting of the patient's current mental status and associated inability to follow instructions, unable to perform full neurologic exam at this time.  As such, assessment of strength, sensation, and cranial nerves is limited at this time. Patient noted to spontaneously move all 4 extremities. No tremors.     Labs on Admission: I have personally reviewed following labs and imaging studies  CBC: Recent Labs  Lab 08/24/23 1154 08/24/23 1415 08/25/23 1735 08/25/23 1736  WBC 4.6  --  5.5  --   NEUTROABS 2.5   --  3.0  --   HGB  11.8* 12.6 11.6* 12.2  HCT 37.5 37.0 37.8 36.0  MCV 96.4  --  100.0  --   PLT 90*  --  92*  --    Basic Metabolic Panel: Recent Labs  Lab 08/24/23 1154 08/24/23 1415 08/25/23 1735 08/25/23 1736  NA 137 139 139 140  K 4.6 5.2* 4.9 4.8  CL 102  --  103 101  CO2 29  --  30  --   GLUCOSE 138*  --  143* 136*  BUN 32*  --  23 29*  CREATININE 1.03*  --  0.89 0.90  CALCIUM  8.6*  --  8.6*  --    GFR: Estimated Creatinine Clearance: 68.4 mL/min (by C-G formula based on SCr of 0.9 mg/dL). Liver Function Tests: Recent Labs  Lab 08/24/23 1154 08/25/23 1735  AST 25 21  ALT 7 7  ALKPHOS 46 41  BILITOT 1.2 1.1  PROT 5.9* 6.0*  ALBUMIN  2.8* 2.7*   Recent Labs  Lab 08/24/23 1154  LIPASE 56*   Recent Labs  Lab 08/24/23 1337  AMMONIA 29   Coagulation Profile: Recent Labs  Lab 08/25/23 1735  INR 1.8*   Cardiac Enzymes: No results for input(s): "CKTOTAL", "CKMB", "CKMBINDEX", "TROPONINI" in the last 168 hours. BNP (last 3 results) Recent Labs    03/13/23 1428  PROBNP 996.0*   HbA1C: No results for input(s): "HGBA1C" in the last 72 hours. CBG: No results for input(s): "GLUCAP" in the last 168 hours. Lipid Profile: No results for input(s): "CHOL", "HDL", "LDLCALC", "TRIG", "CHOLHDL", "LDLDIRECT" in the last 72 hours. Thyroid  Function Tests: No results for input(s): "TSH", "T4TOTAL", "FREET4", "T3FREE", "THYROIDAB" in the last 72 hours. Anemia Panel: No results for input(s): "VITAMINB12", "FOLATE", "FERRITIN", "TIBC", "IRON", "RETICCTPCT" in the last 72 hours. Urine analysis:    Component Value Date/Time   COLORURINE YELLOW 08/24/2023 1639   APPEARANCEUR HAZY (A) 08/24/2023 1639   LABSPEC 1.020 08/24/2023 1639   PHURINE 5.0 08/24/2023 1639   GLUCOSEU 50 (A) 08/24/2023 1639   HGBUR SMALL (A) 08/24/2023 1639   BILIRUBINUR NEGATIVE 08/24/2023 1639   KETONESUR NEGATIVE 08/24/2023 1639   PROTEINUR NEGATIVE 08/24/2023 1639   UROBILINOGEN 1.0  02/15/2015 0837   NITRITE NEGATIVE 08/24/2023 1639   LEUKOCYTESUR LARGE (A) 08/24/2023 1639    Radiological Exams on Admission: CT HEAD CODE STROKE WO CONTRAST Result Date: 08/25/2023 CLINICAL DATA:  Code stroke.  Dysphasia EXAM: CT HEAD WITHOUT CONTRAST TECHNIQUE: Contiguous axial images were obtained from the base of the skull through the vertex without intravenous contrast. RADIATION DOSE REDUCTION: This exam was performed according to the departmental dose-optimization program which includes automated exposure control, adjustment of the mA and/or kV according to patient size and/or use of iterative reconstruction technique. COMPARISON:  CT head without contrast 08/24/2023. MRI of the head without contrast 03/17/2023 FINDINGS: Brain: No acute infarct, hemorrhage, or mass lesion is present. Mild periventricular white matter changes are stable. A remote lacunar infarct is again noted in the left caudate head. The deep brain nuclei are otherwise within normal limits. No acute or focal cortical abnormalities are present. The ventricles are of normal size. No significant extraaxial fluid collection is present. An enlarged relatively empty sella is present. Midline structures are otherwise unremarkable. Vascular: Atherosclerotic calcifications are present within the cavernous internal carotid arteries bilaterally. No hyperdense vessel is present. Skull: Calvarium is intact. No focal lytic or blastic lesions are present. No significant extracranial soft tissue lesion is present. Sinuses/Orbits: The paranasal sinuses and mastoid air cells  are clear. The globes and orbits are within normal limits. Other: Diffuse fatty infiltration of the parotid glands is again noted bilaterally. Multiple punctate calcifications are present in both parotid glands. ASPECTS [facility] Stroke Program Early CT Score) - Ganglionic level infarction (caudate, lentiform nuclei, internal capsule, insula, M1-M3 cortex): 7/7 - Supraganglionic  infarction (M4-M6 cortex): 3/3 Total score (0-10 with 10 being normal): 10/10 IMPRESSION: 1. No acute intracranial abnormality or significant interval change. 2. Stable mild periventricular white matter disease. This likely reflects the sequela of chronic microvascular ischemia. 3. Remote lacunar infarct of the left caudate head. 4. Aspects is 10/10. 5. Diffuse fatty infiltration of the parotid glands bilaterally with multiple punctate calcifications. This suggests Sjogren disease The above was relayed via text pager to Dr. Cleone Dad on 08/25/2023 at 17:48 . Electronically Signed   By: Audree Leas M.D.   On: 08/25/2023 17:48   DG Chest 2 View Result Date: 08/24/2023 CLINICAL DATA:  Chest pain. EXAM: CHEST - 2 VIEW COMPARISON:  10/25/2022. FINDINGS: Heart is enlarged and the mediastinal contour is within normal limits. Left atrial appendage clip and prosthetic valve are noted. Lung volumes are low resulting in vascular crowding. No consolidation, effusion, or pneumothorax. Sternotomy wires are present over the midline. Degenerative changes are seen in the thoracic spine. IMPRESSION: No active cardiopulmonary disease. Electronically Signed   By: Wyvonnia Heimlich M.D.   On: 08/24/2023 16:03   CT Head Wo Contrast Result Date: 08/24/2023 CLINICAL DATA:  Mental status change, unknown cause. EXAM: CT HEAD WITHOUT CONTRAST TECHNIQUE: Contiguous axial images were obtained from the base of the skull through the vertex without intravenous contrast. RADIATION DOSE REDUCTION: This exam was performed according to the departmental dose-optimization program which includes automated exposure control, adjustment of the mA and/or kV according to patient size and/or use of iterative reconstruction technique. COMPARISON:  03/17/2023. FINDINGS: Brain: No acute intracranial hemorrhage, midline shift or mass effect. No extra-axial fluid collection. Mild periventricular white matter hypodensities are present bilaterally. No  hydrocephalus. Vascular: No hyperdense vessel or unexpected calcification. Skull: Normal. Negative for fracture or focal lesion. Sinuses/Orbits: There is partial opacification of the mastoid air cells bilaterally. Mucosal thickening is present in the maxillary sinuses. No acute orbital abnormality. Other: None. IMPRESSION: 1. No acute intracranial process. 2. Mild chronic microvascular ischemic changes. Electronically Signed   By: Wyvonnia Heimlich M.D.   On: 08/24/2023 15:59      Assessment/Plan   Principal Problem:   Acute encephalopathy Active Problems:   Liver cirrhosis secondary to NASH (HCC)   Paroxysmal atrial fibrillation (HCC)   HLD (hyperlipidemia)   DM2 (diabetes mellitus, type 2) (HCC)   Thrombocytopenia (HCC)   Generalized weakness   Dog bite   Moderate persistent asthma     #) Acute encephalopathy: Altered mental status, confusion, somnolence family also noting some intermittent dysarthria over the timeframe. etiology is not ntirely clear at this time.  Differential includes potential toxic encephalopathy from recent initiation of Percocet for pain control for recent dog bite in this patient who is not on opioids at baseline, potentially compounded by sleep interval worsening in renal function relative to creatinine data point from the first week of March 2025, as further quantified above.  This may be further compounded by other pharmacologic factors, including her outpatient gabapentin .  No overt evidence of acute underlying factious process.  Dog bite from 5 days ago does not appear to be infected at this time, but will continue to closely monitor for evidence of infection/cellulitis,  noting that she has been compliant with her interval Augmentin .  Yesterday's urinalysis did show significant pyuria, but was felt to be consistent with a contaminated specimen.  Will recheck urinalysis this evening.  Chest x-ray showed no evidence of acute cortical process, Cleen evidence of  infiltrate to suggest pneumonia, We will COVID, influenza, RSV PCR were all negative.  Also considered the possibility of acute hepatic encephalopathy, noting her history of Florentina Huntsman cirrhosis.  However, ammonia level appears nonelevated, and consistent with her baseline, with presenting value of 23 compared to 21 in November 2024.  Will continue to trend her ammonia level.  VBG shows no evidence of contributory acute hypercapnia.  No evidence of contributory hypoglycemia in the context of her underlying type 2 diabetes mellitus.  She is found to be thrombocytopenic, although this is chronic, and mild elevation in INR and PTT appear consistent with her underlying cirrhosis as well as chronic anticoagulation on Eliquis .  No elevation of bilirubin.  Overall, DIC appears less likely at this time.  Will continue to trend platelet count as well as INR values, as further detailed below.  We will presentation is without overt evidence of acute stroke noting CT head HD last 2 days, which showed no evidence of acute intracranial process, including no evidence of intracranial bleed or any evidence of acute infarct, will pursue MRI of the brain to further evaluate.  Neurology has been consulted, and recommend aforementioned MRI of the brain, and also plan to pursue EEG tomorrow, 4/21.   Urinary drug screen is currently pending.  Will expand evaluation of potential metabolic contributing factors, as further detailed below.   Plan: fall precautions. Delirium precautions. Repeat CMP/CBC in the AM. check TSH, B12 level. Check CPK level, procalcitonin level.  Repeat ammonia level to morning.  Follow-up for result of urinalysis as well as urinary drug screen.  Hold home Percocet and gabapentin  for now.  Every 4 hours neurochecks ordered.  Neurology to formally consult.  MRI brain pending.  EEG anticipated to occur tomorrow, as above.  Prn Narcan .   Aaron Aas                     #) Generalized weakness: Family  reports that the patient has exhibited 2 to 3 days of generalized weakness in the absence of any acute focal weakness.  As noted above, CT head x 2 showed no evidence of acute intracranial process.  MRI brain has been ordered, as above, with results currently pending.  Differential for the patient's generalized weakness is similar to that discussed for acute encephalopathy, including potential pharmacologic contributions from recent initiation of Percocet.   Will further eval for any additional contributions from endocrine/metabolic sources, as detailed below.   Plan: work-up and management of presenting acute encephalopathy, as described above. PT/OT consults ordered for the AM. Fall precautions. CMP/CBC in the AM. Check TSH, serum .  Check CPK level, urinalysis, urinary drug screen.  Hold home Percocet, gabapentin .  Add on procalcitonin level                     #) Recent dog bite to the left forearm: Patient bit by her dog, prn 08/20/2023, but has been on Augmentin  as well as prn Percocet following emergency department visit at that time.  Left arm does not appear to be acutely infected at this time, although we will continue to closely monitor for instigating evidence to suggest developing sepsis, particular given her presenting acute encephalopathy.  In the absence of objective fever and in the absence of leukocytosis, SIRS criteria are not met for sepsis at this time.  Plan: Continue Augmentin .  Repeat CBC in the morning.  Prn acetaminophen  for discomfort.  Holding home Percocet for now in the setting of presenting acute encephalopathy.  Check CPK level.                    #) Paroxysmal atrial fibrillation: Documented history of such. In setting of CHA2DS2-VASc score of  3, there is an indication for chronic anticoagulation for thromboembolic prophylaxis. Consistent with this, patient is chronically anticoagulated on Eliquis . Home AV nodal blocking regimen:  nadolol .  Most recent echocardiogram was performed in November 2024, with results notable for LVEF 65 to 70%, indeterminate diastolic parameters, mildly reduced right ventricular systolic function, severely dilated left atrium, predisposing to recurrent atrial fibrillation, as well as mild mitral regurgitation, mild mitral stenosis, and moderate to severe tricuspid regurgitation. Presenting EKG shows sinus rhythm without overt evidence of acute ischemic changes.   Plan: monitor strict I's & O's and daily weights. CMP/CBC in AM. Check serum mag level. Continue home AV nodal blocking regimen.  Continue outpatient Eliquis .  Monitor on telemetry.                   #) NASH cirrhosis: Documented history of such, dating back to at least 2016, in the absence of history of alcohol abuse.  No reported history of hepatic encephalopathy, and presenting ammonia level appears nonelevated and consistent with her baseline, as further quantified above.  Complicated by chronic thrombocytopenia with baseline platelet count in the 40s to 90s, presenting platelet count consistent with this baseline in trending up slightly from most recent prior platelet count in the first week of March 2025.  Presenting MELD score calculated to be 13, conferring a 6.0% 72-month mortality rate.  Presenting ethanol level less than 10.   Plan: Monitor strict I's and O's and daily weights.  Repeat CMP, CBC, and INR in the AM. Check serum mag level.  Repeat ammonia level in the morning.                   #) Type 2 Diabetes Mellitus: documented history of such. Home insulin  regimen: None. Home oral hypoglycemic agents: farxiga .  She is also on Ozempic as an outpatient. presenting blood sugar: 143. Most recent A1c noted to be 5.8% when checked on 04/08/2023.  Her diabetes is complicated by history of diabetic peripheral polyneuropathy, for which she is on gabapentin .  Plan: accuchecks QAC and HS with low dose SSI.  Add  on hemoglobin A1c level.  Hold home Ozempic as well as home oral hypoglycemic agents during this hospitalization.  In the setting of her presenting acute encephalopathy, will also hold gabapentin  for now.                     #) Hyperlipidemia: documented h/o such. On Zetia  as outpatient.   Plan: continue home statin.                       #) Moderate persistent asthma: documented history thereof, without clinical e/o to suggest current exacerbation, while also noting that presenting CBG shows no evidence of acute hypercapnia. Outpatient respiratory regimen includes   Stiolto respimat  as well as as needed albuterol  inhaler.   Plan: Continue home   Stiolto respimat .  As needed albuterol  nebulizer.  Repeat serum magnesium  level  in the morning.    DVT prophylaxis: SCD's + continuation of outpatient Eliquis  Code Status: Full code Family Communication: none; Disposition Plan: Per Rounding Team Consults called: EDP discussed patient's case with on-call neurology, Dr. Cleone Dad, who conveyed that neurology will formally consult.  Neurology recommends MRI brain and also anticipates pursuing EEG tomorrow when this service is available (4/21). ;  Admission status: Inpatient     I SPENT GREATER THAN 75  MINUTES IN CLINICAL CARE TIME/MEDICAL DECISION-MAKING IN COMPLETING THIS ADMISSION.      Gattis Kass Jazell Rosenau DO [facility]  From 7PM - 7AM   08/25/2023, 7:32 PM

## 2023-08-25 NOTE — Consult Note (Addendum)
 NEUROLOGY CONSULT NOTE   Date of service: August 25, 2023 Patient Name: Whitney [name] MRN:  [id number] DOB:  07-14-1955 Chief Complaint: "Code Stroke" Requesting Provider: No att. providers found  History of Present Illness  Whitney [name] is a 68 y.o. female with hx of Sjogren's syndrome, DM2, fibromyalgia, depression, GERD, history of aortic valve replacement, atrial fibrillation on Eliquis , TIA, NASH cirrhosis, pulmonary hypertension, obesity, 4LNC at baseline, recent dog bite that she was prescribed Norco for.   Last seen normal by family at 1830 last night. This morning when she woke up family noted that she wasn't responding like normal. She has had episodes like this but they typically don't last all day. Family became concerned this evening when she still hadn't returned to baseline. Yesterday when she was at the hospital she was able to walk to the ambulance herself and was Aox4. It is documented that she was having intermittent confusion, intermittently adherent to CPAP and oxygen  at home. On 4/15 she presented with a dog bite to the left arm. Metabolic work up yesterday was unrevealing   BP on arrival 94/55, temp 99.9, glucose 136  LKW: 1830 Modified rankin score: 0-Completely asymptomatic and back to baseline post- stroke IV Thrombolysis: Outside of the window EVT: No, no LVO  NIHSS components Score: Comment  1a Level of Conscious 0[x]  1[]  2[]  3[]      1b LOC Questions 0[x]  1[]  2[]       1c LOC Commands 0[x]  1[]  2[]       2 Best Gaze 0[x]  1[]  2[]       3 Visual 0[x]  1[]  2[]  3[]      4 Facial Palsy 0[x]  1[]  2[]  3[]      5a Motor Arm - left 0[x]  1[]  2[]  3[]  4[]  UN[]    5b Motor Arm - Right 0[x]  1[]  2[]  3[]  4[]  UN[]    6a Motor Leg - Left 0[]  1[]  2[x]  3[]  4[]  UN[]    6b Motor Leg - Right 0[]  1[]  2[x]  3[]  4[]  UN[]    7 Limb Ataxia 0[x]  1[]  2[]  3[]  UN[]     8 Sensory 0[x]  1[]  2[]  UN[]      9 Best Language 0[]  1[x]  2[]  3[]      10 Dysarthria 0[x]  1[]  2[]  UN[]      11 Extinct. and Inattention  0[x]  1[]  2[]       TOTAL:5       ROS  Comprehensive ROS performed and pertinent positives documented in HPI   Past History   Past Medical History:  Diagnosis Date   Allergic rhinitis    Anemia    hx   Arthritis    Asthma    hx yrs ago   Cirrhosis (HCC) last albumin  3.3 done at [facility] 06-16-2014 (under care everywhere tab in epic)   Secondary to Fatty liver --  followed by hepatology at [facility] (dr Orest Bio)    Depression    Diabetes mellitus type II    type 2 diet conrolled   Dyspnea    Fibromyalgia    GERD (gastroesophageal reflux disease)    Heart murmur    asymptomatic ---  1989 from rhuematic fever   History of exercise stress test    05-05-2013---  negative bruce ETT given exercise workload,  no ischemia   History of hiatal hernia    History of kidney stones    History of rheumatic fever    1989   Hyperlipidemia    Leukocytopenia    Moderate aortic stenosis    AVA area 1.1cm2---  cardiologist --  dr Armanda Lan, 2014 in epic   NASH (nonalcoholic steatohepatitis)    OSA (obstructive sleep apnea)    was using CPAP before gastric sleeve 2015--  no uses after wt loss   Pneumonia    hx   Pulmonary hypertension (HCC)    Sjogren's syndrome (HCC)    Thrombocytopenia (HCC)     Past Surgical History:  Procedure Laterality Date   AORTIC VALVE REPLACEMENT N/A 12/05/2017   Procedure: AORTIC VALVE REPLACEMENT (AVR) TISSUE VALVE INSPIRIS;  Surgeon: Bartley Lightning, MD;  Location: [facility] OR;  Service: Open Heart Surgery;  Laterality: N/A;   COLONOSCOPY WITH ESOPHAGOGASTRODUODENOSCOPY (EGD)     CYSTOSCOPY WITH RETROGRADE PYELOGRAM, URETEROSCOPY AND STENT PLACEMENT Left 01/21/2015   Procedure: CYSTOSCOPY WITH LEFT  RETROGRADE PYELOGRAM, LEFT URETEROSCOPY AND STENT PLACEMENT;  Surgeon: Christina Coyer, MD;  Location: [facility];  Service: Urology;  Laterality: Left;   CYSTOSCOPY WITH RETROGRADE PYELOGRAM, URETEROSCOPY AND STENT PLACEMENT Bilateral 02/24/2015    Procedure: CYSTOSCOPY WITH RIGHT RETROGRADE PYELOGRAM, BLADDER BIOPSY FULGERATION LEFT URETEROSCOPY AND STENT REPLACEMENT;  Surgeon: Christina Coyer, MD;  Location: [facility];  Service: Urology;  Laterality: Bilateral;   EXPLORATORY LAPAROSCOPY W/  CONE BIOPSY'S LEFT AND RIGHT LOBE OF LIVER  11-04-2007   HOLMIUM LASER APPLICATION Left 02/24/2015   Procedure: HOLMIUM LASER LITHOTRIPSY;  Surgeon: Christina Coyer, MD;  Location: [facility];  Service: Urology;  Laterality: Left;   HYSTEROSCOPY WITH D & C N/A 12/11/2012   Procedure: DILATATION AND CURETTAGE /HYSTEROSCOPY;  Surgeon: Lizette Righter. Wynona Hedger, MD;  Location: [facility];  Service: Gynecology;  Laterality: N/A;   INGUINAL HERNIA REPAIR Left 10/22/2016   Procedure: LEFT INGUINAL HERNIA REPAIR;  Surgeon: Enid Harry, MD;  Location: [facility] OR;  Service: General;  Laterality: Left;  TAP BLOCK   INSERTION OF MESH Left 10/22/2016   Procedure: INSERTION OF MESH;  Surgeon: Enid Harry, MD;  Location: [facility] OR;  Service: General;  Laterality: Left;   LAPAROSCOPIC GASTRIC SLEEVE RESECTION  07-27-2013   LOOP RECORDER INSERTION N/A 04/16/2022   Procedure: LOOP RECORDER INSERTION;  Surgeon: Verona Goodwill, MD;  Location: [facility] INVASIVE CV LAB;  Service: Cardiovascular;  Laterality: N/A;   PUBOVAGINAL SLING  04-10-2001   [facility]   RIGHT HEART CATH N/A 04/11/2023   Procedure: RIGHT HEART CATH;  Surgeon: Darlis Eisenmenger, MD;  Location: [facility] INVASIVE CV LAB;  Service: Cardiovascular;  Laterality: N/A;   RIGHT/LEFT HEART CATH AND CORONARY ANGIOGRAPHY N/A 08/23/2017   Procedure: RIGHT/LEFT HEART CATH AND CORONARY ANGIOGRAPHY;  Surgeon: Lucendia Rusk, MD;  Location: [facility] INVASIVE CV LAB;  Service: Cardiovascular;  Laterality: N/A;   TEE WITHOUT CARDIOVERSION N/A 12/05/2017   Procedure: TRANSESOPHAGEAL ECHOCARDIOGRAM (TEE);  Surgeon: Bartley Lightning, MD;  Location: [facility] OR;  Service: Open Heart Surgery;  Laterality: N/A;   TONSILLECTOMY  1975    TRANSTHORACIC ECHOCARDIOGRAM  06-04-2012  dr Jacquelynn Matter   mild LVH,  grade I diastolic dysfunction/  ef 60-65%/  moderate LAE/  mild MV calcifation without stenosis,  mild MR/  moderate AV stenosis,  cannot r/o bicupsid, area 1.1cm2/  mild dilated aortic root/  trivial TR    Family History: Family History  Problem Relation Age of Onset   Alzheimer's disease Father    Hip fracture Father    Asthma Father    Heart disease Father    Heart attack Father    Hypertension Father    Rheum arthritis Mother    Heart  disease Mother    Allergies Daughter    Asthma Paternal Grandmother    Asthma Daughter    Stroke Neg Hx     Social History  reports that she has never smoked. She has been exposed to tobacco smoke. She has never used smokeless tobacco. She reports that she does not currently use alcohol. She reports that she does not use drugs.  Allergies  Allergen Reactions   Prednisone      Other Reaction(s): delerium   Doxycycline Hives and Rash   Naproxen Rash and Hives    Medications  No current facility-administered medications for this encounter.  Current Outpatient Medications:    albuterol  (PROVENTIL ) (2.5 MG/3ML) 0.083% nebulizer solution, Take 3 mLs (2.5 mg total) by nebulization every 6 (six) hours as needed for wheezing or shortness of breath., Disp: 75 mL, Rfl: 5   albuterol  (VENTOLIN  HFA) 108 (90 Base) MCG/ACT inhaler, INHALE 2 PUFFS INTO THE LUNGS EVERY 4 HOURS AS NEEDED FOR WHEEZING OR SHORTNESS OF BREATH., Disp: 8.5 each, Rfl: 5   amoxicillin -clavulanate (AUGMENTIN ) 875-125 MG tablet, Take 1 tablet by mouth every 12 (twelve) hours., Disp: 14 tablet, Rfl: 0   ELIQUIS  5 MG TABS tablet, TAKE 1 TABLET BY MOUTH TWICE A DAY, Disp: 90 tablet, Rfl: 2   Evolocumab  (REPATHA  SURECLICK) 140 MG/ML SOAJ, Inject 140 mg into the skin every 14 (fourteen) days., Disp: 6 mL, Rfl: 3   ezetimibe  (ZETIA ) 10 MG tablet, Take 10 mg by mouth at bedtime. , Disp: , Rfl: 11   FARXIGA  10 MG TABS  tablet, Take 1 tablet (10 mg total) by mouth daily before breakfast., Disp: 90 tablet, Rfl: 3   furosemide  (LASIX ) 20 MG tablet, Take 2 tablets (40 mg total) by mouth daily., Disp: 90 tablet, Rfl: 3   gabapentin  (NEURONTIN ) 100 MG capsule, Take 1 capsule (100 mg total) by mouth 2 (two) times daily at 10 am and 4 pm AND 3 capsules (300 mg total) at bedtime., Disp: 150 capsule, Rfl: 5   hydroxychloroquine  (PLAQUENIL ) 200 MG tablet, Take 1 tablet (200 mg total) by mouth daily., Disp: 30 tablet, Rfl: 2   MAGNESIUM  GLUCONATE PO, Take 1 tablet by mouth daily., Disp: , Rfl:    nadolol  (CORGARD ) 40 MG tablet, TAKE 1 TABLET BY MOUTH EVERY DAY, Disp: 30 tablet, Rfl: 0   oxyCODONE -acetaminophen  (PERCOCET/ROXICET) 5-325 MG tablet, Take 1 tablet by mouth every 6 (six) hours as needed for severe pain (pain score 7-10)., Disp: 15 tablet, Rfl: 0   potassium chloride  SA (KLOR-CON  M) 20 MEQ tablet, Take 1 tablet (20 mEq total) by mouth daily., Disp: 90 tablet, Rfl: 3   Semaglutide,0.25 or 0.5MG /DOS, (OZEMPIC, 0.25 OR 0.5 MG/DOSE,) 2 MG/3ML SOPN, Inject 0.5 mg into the skin once a week., Disp: , Rfl:    tadalafil , PAH, (ADCIRCA ) 20 MG tablet, Take 2 tablets (40 mg total) by mouth daily., Disp: 60 tablet, Rfl: 11   Tiotropium Bromide-Olodaterol (STIOLTO RESPIMAT ) 2.5-2.5 MCG/ACT AERS, INHALE 2 PUFFS INTO THE LUNGS DAILY. INHALE 2 PUFFS BY MOUTH INTO THE LUNGS DAILY, Disp: 12 g, Rfl: 0  Vitals   Vitals:   08/25/23 1700  Weight: 105.2 kg    Body mass index is 40.12 kg/m.  Physical Exam   Constitutional: Appears well-developed and well-nourished.  Psych: Affect flat, minimally interactive Eyes: No scleral injection.  Head/neck: Normocephalic.  No nuchal rigidity Cardiovascular: Normal rate and regular rhythm.  Respiratory: Effort normal, non-labored breathing. 4LNC at baseline GI: Soft.  No distension. There is  no tenderness.   Neurologic Examination   Neuro: Mental Status: Patient is awake, alert,  oriented to month and age. Patient is slow to respond to questions.  Sparse speech, but names a few simple objects correctly for me.  Only intermittently follows commands Cranial Nerves: II: Visual Fields are full to blink to threat. Pupils are equal, round, and reactive to light.   III,IV, VI: EOMI without ptosis or diploplia.  V: Facial sensation is symmetric to temperature VII: Facial movement is symmetric resting and smiling VIII: Hearing is intact to voice Motor: No drift of the bilateral upper extremities.  Poor participation with examination of the bilateral lower extremities but does lift them slightly antigravity to noxious stimulation Sensory: Equally reactive to touch bilaterally Cerebellar: Finger-to-nose intact bilaterally.   Labs/Imaging/Neurodiagnostic studies   CBC:  Recent Labs  Lab 09/12/23 1154 09/12/2023 1415 08/25/23 1735 08/25/23 1736  WBC 4.6  --  5.5  --   NEUTROABS 2.5  --  3.0  --   HGB 11.8*   < > 11.6* 12.2  HCT 37.5   < > 37.8 36.0  MCV 96.4  --  100.0  --   PLT 90*  --  92*  --    < > = values in this interval not displayed.  (Baseline platelets 40 to 70)  Basic Metabolic Panel:  Lab Results  Component Value Date   NA 140 08/25/2023   K 4.8 08/25/2023   CO2 30 08/25/2023   GLUCOSE 136 (H) 08/25/2023   BUN 29 (H) 08/25/2023   CREATININE 0.90 08/25/2023   CALCIUM  8.6 (L) 08/25/2023   GFRNONAA >60 08/25/2023   GFRAA >60 12/17/2018   Lipid Panel:  Lab Results  Component Value Date   CHOL 176 05/20/2023   HDL 39 (L) 05/20/2023   LDLCALC 118 (H) 05/20/2023   TRIG 106 05/20/2023   CHOLHDL 4.5 (H) 05/20/2023     HgbA1c:  Lab Results  Component Value Date   HGBA1C 5.8 (H) 04/08/2023   Urine Drug Screen:     Component Value Date/Time   LABOPIA NONE DETECTED 04/13/2022 1651   COCAINSCRNUR NONE DETECTED 04/13/2022 1651   LABBENZ NONE DETECTED 04/13/2022 1651   AMPHETMU NONE DETECTED 04/13/2022 1651   THCU NONE DETECTED 04/13/2022  1651   LABBARB NONE DETECTED 04/13/2022 1651    Alcohol  Level     Component Value Date/Time   ETH <10 05/23/2022 1840   INR  Lab Results  Component Value Date   INR 1.6 (H) 04/11/2023   APTT  Lab Results  Component Value Date   APTT 33 03/17/2023   CT Head without contrast(Personally reviewed): 1. No acute intracranial abnormality or significant interval change. 2. Stable mild periventricular white matter disease. This likely reflects the sequela of chronic microvascular ischemia. 3. Remote lacunar infarct of the left caudate head. 4. Aspects is 10/10. 5. Diffuse fatty infiltration of the parotid glands bilaterally with multiple punctate calcifications. This suggests Sjogren disease  Neurodiagnostics rEEG:  Planned  ASSESSMENT   Whitney [name] is a 68 y.o. female presenting with slow responsiveness; exam does not appear to be consistent with aphasia  However given her risk factors, I do think it is reasonable to obtain MRI brain with and without contrast and EEG.   Of note she is on Plaquenil  so relatively immunosuppressed.  May not mount a high fever or leukocytosis.  However she did not appear ill, vitals appear stable, so feel it is most appropriate to monitor clinically  for now as risks of empiric meningitis coverage with antibiotics are substantial and not felt to outweigh benefit here  RECOMMENDATIONS  - MRI brain w wo  - EEG tomorrow (not available overnight on Sunday) - Continue toxic/metabolic work up per primary team including assessment of adherence to oxygen  at home and reassessment of adherence to CPAP - Low threshold to start meningitis coverage if she is worsening or becomes febrile or ill-appearing - Neurology will follow  ______________________________________________________________________  Signed, Imogene Mana, NP [facility]  Attending Neurologist's note:  I personally saw this patient, gathering history, performing a neurologic  examination, reviewing relevant labs, personally reviewing relevant imaging including Head CT, and formulated the assessment and plan, adding the note above for completeness and clarity to accurately reflect my thoughts   Baldwin Levee MD-PhD [facility] [phone] Available 7 AM to 7 PM, outside these hours please contact Neurologist on call listed on AMION

## 2023-08-25 NOTE — ED Triage Notes (Signed)
 PT BIB [facility] from home with a LKW of 1830 on 4/19. Pt had been experiencing spells of dysphagia all week but today family member stated that it has lasted al day. PT has very little verbal response but speech is clear.There are no neuro deficits, CBG:117 and PT's O2 is 96% on 4L at baseline.PT does have a dog bite on her left arm which is warm to touch.PT is on eliquis  and is usually A+O x 4. Son is bedside.

## 2023-08-25 NOTE — Code Documentation (Signed)
 Stroke Response Nurse Documentation Code Documentation  CYANNA NEACE is a 68 y.o. female arriving to [facility]  via [facility] as Code Stroke activation. LKW 1830 yesterday and noted this AM by family to not be in usual state. Episode lasted throughout day which prompted EMS. Recent dog bite. Metabolic work up in ED yesterday.   Stroke team met pt at bridge. Labs drawn, airway cleared, pt taken to CT. NIH 5, see flowsheet for details. CT completed. Pt OOW, not consistent with LVO. Care Plan: MRI, EEG. Q2h NIH and vitals. Bedside handoff with ED RN Vance Gell  Rapid Response RN

## 2023-08-26 ENCOUNTER — Inpatient Hospital Stay (HOSPITAL_COMMUNITY)

## 2023-08-26 DIAGNOSIS — R4182 Altered mental status, unspecified: Secondary | ICD-10-CM | POA: Diagnosis not present

## 2023-08-26 DIAGNOSIS — I4891 Unspecified atrial fibrillation: Secondary | ICD-10-CM

## 2023-08-26 DIAGNOSIS — G9340 Encephalopathy, unspecified: Secondary | ICD-10-CM | POA: Diagnosis not present

## 2023-08-26 DIAGNOSIS — D696 Thrombocytopenia, unspecified: Secondary | ICD-10-CM | POA: Diagnosis not present

## 2023-08-26 DIAGNOSIS — I639 Cerebral infarction, unspecified: Secondary | ICD-10-CM | POA: Diagnosis not present

## 2023-08-26 DIAGNOSIS — Z7901 Long term (current) use of anticoagulants: Secondary | ICD-10-CM | POA: Diagnosis not present

## 2023-08-26 DIAGNOSIS — R569 Unspecified convulsions: Secondary | ICD-10-CM | POA: Diagnosis not present

## 2023-08-26 LAB — GLUCOSE, CAPILLARY
Glucose-Capillary: 83 mg/dL (ref 70–99)
Glucose-Capillary: 87 mg/dL (ref 70–99)

## 2023-08-26 LAB — COMPREHENSIVE METABOLIC PANEL WITH GFR
ALT: 7 U/L (ref 0–44)
AST: 19 U/L (ref 15–41)
Albumin: 3 g/dL — ABNORMAL LOW (ref 3.5–5.0)
Alkaline Phosphatase: 45 U/L (ref 38–126)
Anion gap: 7 (ref 5–15)
BUN: 22 mg/dL (ref 8–23)
CO2: 31 mmol/L (ref 22–32)
Calcium: 9 mg/dL (ref 8.9–10.3)
Chloride: 103 mmol/L (ref 98–111)
Creatinine, Ser: 0.86 mg/dL (ref 0.44–1.00)
GFR, Estimated: >60 mL/min (ref >60)
Glucose, Bld: 95 mg/dL (ref 70–99)
Potassium: 5.1 mmol/L (ref 3.5–5.1)
Sodium: 141 mmol/L (ref 135–145)
Total Bilirubin: 1.3 mg/dL — ABNORMAL HIGH (ref 0.0–1.2)
Total Protein: 6.5 g/dL (ref 6.5–8.1)

## 2023-08-26 LAB — CBG MONITORING, ED
Glucose-Capillary: 89 mg/dL (ref 70–99)
Glucose-Capillary: 83 mg/dL (ref 70–99)
Glucose-Capillary: 86 mg/dL (ref 70–99)

## 2023-08-26 LAB — CBC WITH DIFFERENTIAL/PLATELET
Abs Immature Granulocytes: 0.01 10*3/uL (ref 0.00–0.07)
Basophils Absolute: 0 10*3/uL (ref 0.0–0.1)
Basophils Relative: 1 %
Eosinophils Absolute: 0.1 10*3/uL (ref 0.0–0.5)
Eosinophils Relative: 2 %
HCT: 40.4 % (ref 36.0–46.0)
Hemoglobin: 12.5 g/dL (ref 12.0–15.0)
Immature Granulocytes: 0 %
Lymphocytes Relative: 29 %
Lymphs Abs: 1.4 10*3/uL (ref 0.7–4.0)
MCH: 30.8 pg (ref 26.0–34.0)
MCHC: 30.9 g/dL (ref 30.0–36.0)
MCV: 99.5 fL (ref 80.0–100.0)
Monocytes Absolute: 0.7 10*3/uL (ref 0.1–1.0)
Monocytes Relative: 15 %
Neutro Abs: 2.6 10*3/uL (ref 1.7–7.7)
Neutrophils Relative %: 53 %
Platelets: 80 10*3/uL — ABNORMAL LOW (ref 150–400)
RBC: 4.06 MIL/uL (ref 3.87–5.11)
RDW: 14.8 % (ref 11.5–15.5)
WBC: 4.9 10*3/uL (ref 4.0–10.5)
nRBC: 0 % (ref 0.0–0.2)

## 2023-08-26 LAB — AMMONIA: Ammonia: 29 umol/L (ref 9–35)

## 2023-08-26 LAB — HEMOGLOBIN A1C
Hgb A1c MFr Bld: 5.1 % (ref 4.8–5.6)
Mean Plasma Glucose: 99.67 mg/dL

## 2023-08-26 LAB — PROTIME-INR
INR: 1.5 — ABNORMAL HIGH (ref 0.8–1.2)
Prothrombin Time: 18 s — ABNORMAL HIGH (ref 11.4–15.2)

## 2023-08-26 LAB — TSH: TSH: 1.35 u[IU]/mL (ref 0.350–4.500)

## 2023-08-26 LAB — MAGNESIUM: Magnesium: 2.4 mg/dL (ref 1.7–2.4)

## 2023-08-26 LAB — CK: Total CK: 23 U/L — ABNORMAL LOW (ref 38–234)

## 2023-08-26 LAB — VITAMIN B12: Vitamin B-12: 681 pg/mL (ref 180–914)

## 2023-08-26 MED ORDER — SODIUM CHLORIDE 0.9 % IV BOLUS
1000.0000 mL | Freq: Once | INTRAVENOUS | Status: AC
  Administered 2023-08-26: 1000 mL via INTRAVENOUS

## 2023-08-26 MED ORDER — SODIUM CHLORIDE 0.9 % IV SOLN
2.0000 g | INTRAVENOUS | Status: DC
Start: 2023-08-26 — End: 2023-08-27
  Administered 2023-08-26 – 2023-08-27 (×7): 2 g via INTRAVENOUS
  Filled 2023-08-26 (×13): qty 2000

## 2023-08-26 MED ORDER — METOPROLOL TARTRATE 5 MG/5ML IV SOLN: 2.5000 mg | INTRAVENOUS | Status: DC | PRN

## 2023-08-26 MED ORDER — INSULIN ASPART 100 UNIT/ML IJ SOLN: 0.0000 [IU] | INTRAMUSCULAR | Status: DC

## 2023-08-26 MED ORDER — VANCOMYCIN HCL 2000 MG/400ML IV SOLN
2000.0000 mg | Freq: Once | INTRAVENOUS | Status: AC
  Administered 2023-08-26: 2000 mg via INTRAVENOUS
  Filled 2023-08-26: qty 400

## 2023-08-26 MED ORDER — ACYCLOVIR SODIUM 50 MG/ML IV SOLN
700.0000 mg | Freq: Three times a day (TID) | INTRAVENOUS | Status: DC
  Administered 2023-08-26 – 2023-08-27 (×4): 700 mg via INTRAVENOUS
  Filled 2023-08-26 (×6): qty 14

## 2023-08-26 MED ORDER — SODIUM CHLORIDE 0.9 % IV SOLN
2.0000 g | Freq: Two times a day (BID) | INTRAVENOUS | Status: DC
  Administered 2023-08-26 – 2023-08-27 (×3): 2 g via INTRAVENOUS
  Filled 2023-08-26 (×3): qty 20

## 2023-08-26 MED ORDER — STROKE: EARLY STAGES OF RECOVERY BOOK
Freq: Once | Status: AC
  Filled 2023-08-26: qty 1

## 2023-08-26 MED ORDER — VANCOMYCIN HCL 750 MG/150ML IV SOLN
750.0000 mg | Freq: Two times a day (BID) | INTRAVENOUS | Status: DC
  Administered 2023-08-27 (×2): 750 mg via INTRAVENOUS
  Filled 2023-08-26 (×3): qty 150

## 2023-08-26 MED ORDER — IOHEXOL 350 MG/ML SOLN
75.0000 mL | Freq: Once | INTRAVENOUS | Status: AC | PRN
  Administered 2023-08-26: 75 mL via INTRAVENOUS

## 2023-08-26 MED ORDER — SODIUM CHLORIDE 0.9 % IV SOLN
INTRAVENOUS | Status: DC
Start: 2023-08-26 — End: 2023-08-27

## 2023-08-26 NOTE — ED Notes (Signed)
 Patient irritable when touched, stating "leave me alone" and jerking arms away from staff.

## 2023-08-26 NOTE — Progress Notes (Signed)
 Clinical/Bedside Swallow Evaluation Patient Details  Name: Whitney [name] MRN: [id number] Date of Birth: 1956-03-06  Today's Date: 08/26/2023 Time: SLP Start Time (ACUTE ONLY): 1146 SLP Stop Time (ACUTE ONLY): 1157 SLP Time Calculation (min) (ACUTE ONLY): 11 min  Past Medical History:  Past Medical History:  Diagnosis Date   Allergic rhinitis    Anemia    hx   Arthritis    Asthma    hx yrs ago   Cirrhosis (HCC) last albumin  3.3 done at [facility] 06-16-2014 (under care everywhere tab in epic)   Secondary to Fatty liver --  followed by hepatology at [facility] (dr Orest Bio)    Depression    Diabetes mellitus type II    type 2 diet conrolled   Dyspnea    Fibromyalgia    GERD (gastroesophageal reflux disease)    Heart murmur    asymptomatic ---  1989 from rhuematic fever   History of exercise stress test    05-05-2013---  negative bruce ETT given exercise workload,  no ischemia   History of hiatal hernia    History of kidney stones    History of rheumatic fever    1989   Hyperlipidemia    Leukocytopenia    Moderate aortic stenosis    AVA area 1.1cm2---  cardiologist --  dr Armanda Lan, 2014 in epic   NASH (nonalcoholic steatohepatitis)    OSA (obstructive sleep apnea)    was using CPAP before gastric sleeve 2015--  no uses after wt loss   Pneumonia    hx   Pulmonary hypertension (HCC)    Sjogren's syndrome (HCC)    Thrombocytopenia (HCC)    Past Surgical History:  Past Surgical History:  Procedure Laterality Date   AORTIC VALVE REPLACEMENT N/A 12/05/2017   Procedure: AORTIC VALVE REPLACEMENT (AVR) TISSUE VALVE INSPIRIS;  Surgeon: Bartley Lightning, MD;  Location: [facility] OR;  Service: Open Heart Surgery;  Laterality: N/A;   COLONOSCOPY WITH ESOPHAGOGASTRODUODENOSCOPY (EGD)     CYSTOSCOPY WITH RETROGRADE PYELOGRAM, URETEROSCOPY AND STENT PLACEMENT Left 01/21/2015   Procedure: CYSTOSCOPY WITH LEFT  RETROGRADE PYELOGRAM, LEFT URETEROSCOPY AND STENT PLACEMENT;  Surgeon: Christina Coyer,  MD;  Location: [facility];  Service: Urology;  Laterality: Left;   CYSTOSCOPY WITH RETROGRADE PYELOGRAM, URETEROSCOPY AND STENT PLACEMENT Bilateral 02/24/2015   Procedure: CYSTOSCOPY WITH RIGHT RETROGRADE PYELOGRAM, BLADDER BIOPSY FULGERATION LEFT URETEROSCOPY AND STENT REPLACEMENT;  Surgeon: Christina Coyer, MD;  Location: [facility];  Service: Urology;  Laterality: Bilateral;   EXPLORATORY LAPAROSCOPY W/  CONE BIOPSY'S LEFT AND RIGHT LOBE OF LIVER  11-04-2007   HOLMIUM LASER APPLICATION Left 02/24/2015   Procedure: HOLMIUM LASER LITHOTRIPSY;  Surgeon: Christina Coyer, MD;  Location: [facility];  Service: Urology;  Laterality: Left;   HYSTEROSCOPY WITH D & C N/A 12/11/2012   Procedure: DILATATION AND CURETTAGE /HYSTEROSCOPY;  Surgeon: Lizette Righter. Wynona Hedger, MD;  Location: [facility];  Service: Gynecology;  Laterality: N/A;   INGUINAL HERNIA REPAIR Left 10/22/2016   Procedure: LEFT INGUINAL HERNIA REPAIR;  Surgeon: Enid Harry, MD;  Location: [facility] OR;  Service: General;  Laterality: Left;  TAP BLOCK   INSERTION OF MESH Left 10/22/2016   Procedure: INSERTION OF MESH;  Surgeon: Enid Harry, MD;  Location: [facility] OR;  Service: General;  Laterality: Left;   LAPAROSCOPIC GASTRIC SLEEVE RESECTION  07-27-2013   LOOP RECORDER INSERTION N/A 04/16/2022   Procedure: LOOP RECORDER INSERTION;  Surgeon: Verona Goodwill, MD;  Location: [facility] INVASIVE CV LAB;  Service: Cardiovascular;  Laterality: N/A;   PUBOVAGINAL SLING  04-10-2001   [facility]   RIGHT HEART CATH N/A 04/11/2023   Procedure: RIGHT HEART CATH;  Surgeon: Darlis Eisenmenger, MD;  Location: [facility] INVASIVE CV LAB;  Service: Cardiovascular;  Laterality: N/A;   RIGHT/LEFT HEART CATH AND CORONARY ANGIOGRAPHY N/A 08/23/2017   Procedure: RIGHT/LEFT HEART CATH AND CORONARY ANGIOGRAPHY;  Surgeon: Lucendia Rusk, MD;  Location: [facility] INVASIVE CV LAB;  Service: Cardiovascular;  Laterality: N/A;   TEE WITHOUT CARDIOVERSION N/A  12/05/2017   Procedure: TRANSESOPHAGEAL ECHOCARDIOGRAM (TEE);  Surgeon: Bartley Lightning, MD;  Location: [facility] OR;  Service: Open Heart Surgery;  Laterality: N/A;   TONSILLECTOMY  1975   TRANSTHORACIC ECHOCARDIOGRAM  06-04-2012  dr Jacquelynn Matter   mild LVH,  grade I diastolic dysfunction/  ef 60-65%/  moderate LAE/  mild MV calcifation without stenosis,  mild MR/  moderate AV stenosis,  cannot r/o bicupsid, area 1.1cm2/  mild dilated aortic root/  trivial TR   HPI:  Whitney [name] is a 68 yo female presenting to ED 4/20 with AMS. MRI Brain shows small acute posterior R frontal white matter infarct. Seen by SLP 70/9/23 with WFL oropharyngeal swallow and cognition. PMH includes Sjogren's syndrome, T2DM, fibromyalgia, depression, GERD, history of aortic valve replacement, A-fib on Eliquis , TIA, NASH cirrhosis, pulmonary HTN, 4L [place] at baseline    Assessment / Plan / Recommendation  Clinical Impression  Pt is currently lethargic, pursing her lips to refuse POs. She did not follow commands to complete an oral motor exam, although no focal weakness was noted. Attempted to provide trials of ice chips, liquids, and purees but pt never opened her mouth to accept the bolus. Given current lethargy and mentation overall, recommend she remain NPO. If she becomes significantly more alert throughout the day today, she can have meds crushed in puree but given current presentation do not feel she could take PO meds. SLP will continue following. SLP Visit Diagnosis: Dysphagia, unspecified (R13.10)    Aspiration Risk  Mild aspiration risk    Diet Recommendation NPO    Medication Administration: Via alternative means    Other  Recommendations Oral Care Recommendations: Oral care QID    Recommendations for follow up therapy are one component of a multi-disciplinary discharge planning process, led by the attending physician.  Recommendations may be updated based on patient status, additional functional criteria and insurance  authorization.  Follow up Recommendations Other (comment) (TBA)      Assistance Recommended at Discharge    Functional Status Assessment Patient has had a recent decline in their functional status and demonstrates the ability to make significant improvements in function in a reasonable and predictable amount of time.  Frequency and Duration min 2x/week  2 weeks       Prognosis Prognosis for improved oropharyngeal function: Good Barriers to Reach Goals: Cognitive deficits      Swallow Study   General HPI: Whitney [name] is a 68 yo female presenting to ED 4/20 with AMS. MRI Brain shows small acute posterior R frontal white matter infarct. Seen by SLP 70/9/23 with WFL oropharyngeal swallow and cognition. PMH includes Sjogren's syndrome, T2DM, fibromyalgia, depression, GERD, history of aortic valve replacement, A-fib on Eliquis , TIA, NASH cirrhosis, pulmonary HTN, 4L [place] at baseline Type of Study: Bedside Swallow Evaluation Previous Swallow Assessment: see HPI Diet Prior to this Study: NPO Temperature Spikes Noted: No Respiratory Status: Nasal cannula History of Recent Intubation: No Behavior/Cognition: Lethargic/Drowsy;Requires cueing Oral Cavity Assessment:  Within Functional Limits Oral Care Completed by SLP: No Oral Cavity - Dentition: Adequate natural dentition Vision: Functional for self-feeding Self-Feeding Abilities: Refused PO Patient Positioning: Upright in bed Baseline Vocal Quality: Normal Volitional Cough: Cognitively unable to elicit Volitional Swallow: Unable to elicit    Oral/Motor/Sensory Function Overall Oral Motor/Sensory Function: Within functional limits   Ice Chips Ice chips: Impaired Presentation: Spoon Oral Phase Impairments: Poor awareness of bolus   Thin Liquid Thin Liquid: Impaired Presentation: Cup;Spoon;Straw Oral Phase Impairments: Poor awareness of bolus    Nectar Thick Nectar Thick Liquid: Not tested   Honey Thick Honey Thick Liquid: Not tested    Puree Puree: Impaired Presentation: Spoon Oral Phase Impairments: Poor awareness of bolus   Solid     Solid: Not tested      Amil Kale, M.A., CCC-SLP Speech Language Pathology, Acute Rehabilitation Services  Secure Chat preferred [phone]  08/26/2023,12:16 PM

## 2023-08-26 NOTE — ED Notes (Signed)
 Unsuccessful with lab draw, phleb made aware.

## 2023-08-26 NOTE — Progress Notes (Signed)
 EEG complete - results pending

## 2023-08-26 NOTE — Procedures (Signed)
 Patient Name: CAERA ENWRIGHT  MRN: [id number]  Epilepsy Attending: Arleene Lack  Referring Physician/Provider: Colon Dear, NP  Date: 08/26/2023 Duration: 23.39 mins  Patient history: 68yo F with ams. EEG to evaluate for seizure  Level of alertness: Awake, asleep  AEDs during EEG study: None  Technical aspects: This EEG study was done with scalp electrodes positioned according to the 10-20 International system of electrode placement. Electrical activity was reviewed with band pass filter of 1-70Hz , sensitivity of 7 uV/mm, display speed of 44mm/sec with a 60Hz  notched filter applied as appropriate. EEG data were recorded continuously and digitally stored.  Video monitoring was available and reviewed as appropriate.  Description: The posterior dominant rhythm consists of 7 Hz activity of moderate voltage (25-35 uV) seen predominantly in posterior head regions, symmetric and reactive to eye opening and eye closing. Sleep was characterized by sleep spindles (12 to 14 Hz), maximal frontocentral region. EEG showed continuous generalized 6 to 7 Hz theta slowing. Hyperventilation and photic stimulation were not performed.     ABNORMALITY - Continuous slow, generalized  IMPRESSION: This study is suggestive of mild to moderate diffuse encephalopathy. No seizures or epileptiform discharges were seen throughout the recording.  Cristan Scherzer O Marciel Offenberger

## 2023-08-26 NOTE — Progress Notes (Addendum)
 PROGRESS NOTE                                                                                                                                                                                                             Patient Demographics:    Whitney [name], is a 67 y.o. female, DOB - 1955-10-14, BJY:[id number]  Outpatient Primary MD for the patient is Jimmey Mould, MD    LOS - 1  Admit date - 08/25/2023    Chief Complaint  Patient presents with   Altered Mental Status   Code Stroke       Brief Narrative (HPI from H&P)   Whitney [name] is a 68 y.o. female with medical history significant for paroxysmal atrial fibrillation currently anticoagulated on Eliquis , NASH cirrhosis, type 2 diabetes mellitus companied by diabetic peripheral polyneuropathy, hyperlipidemia, aortic stenosis status post aortic valve replacement in August 2019, Sosan syndrome, moderate persistent asthma, thrombocytopenia with baseline platelet count 40s to 90s, who is admitted to [facility] on 08/25/2023 with acute encephalopathy after presenting from home to [facility] ED for evaluation of altered mental status.    In the setting of the patient's altered mental status, following history provided by patient's family in addition to my discussions with the EDP and via chart review.   The patient was bit by her dog on the left forearm on 08/20/2023, at which time she presented to the vascular emergency department for further evaluation and management thereof.  At that time, she was discharged to home on Augmentin  as well as prn Percocet.  He is making good compliance in the interval with her Augmentin  prn use of aforementioned Percocet.   Subsequently, over the last 2 days, family has noted the patient to be used to the baseline, somnolent, with generalized weakness in the absence of any acute focal weakness, the patient was intermittently slurring her speech.   He has not noted any tonic-clonic activity during her recent fall/trauma.   The patient was brought to the emergency department today, 08/24/2023 for further evaluation management of the above, at which time workup included CT head which showed no evidence of acute intracranial process.  Urinalysis was obtained at that time and demonstrated 21-50 white blood cells, but was felt to be contaminated given concomitant presence of 11-20 squamous epithelial cells.  She was subsequent  discharged to home.  However, in the absence of any significant interval improvement in her mental status, she was brought back to the emergency department this evening for further evaluation management thereof.   Her medical history is notable for paroxysmal atrial fibrillation which is chronically anticoagulated on Eliquis .  She also has a history of Florentina Huntsman cirrhosis, with most recent prior ammonia level 21 in November 2024.  Not on lactulose  as an outpatient.  This is complicated by chronic thrombocytopenia with baseline platelet counts 40s to 90s.  She has a history of serotonin syndrome for she has been prescribed hydroxychloroquine , but has not yet started on this medication.   Family conveys that the patient has no history of alcohol abuse nor any recreational drug use.  CTH was negative. EDP discussed patient's case with on-call neurology, Dr. Cleone Dad, who conveyed that neurology will formally consult.  Neurology recommends MRI brain and also anticipates pursuing EEG tomorrow when this service is available (4/21). TRH was asked to see.      Subjective:    Whitney [name] was evaluated at the bed side with daughter and granddaughter in the room. Patient with eyes closed, only responding to noxious stimuli. Does not follow any commands. Family reports she has been more drowsy today compared to yesterday. She has also refused to open her eyes.   Assessment  & Plan :    Assessment and Plan:  # Acute encephalopathy # Acute  stroke, likely embolic # ?Meningitis - Presented with progressive decline in mental status over the last week - Family also reports intermittent low-grade fevers at home - MRI brain shows Small acute posterior right frontal white matter infarct.  Negative CTA head and neck - EEG shows mild to moderate diffuse encephalopathy but no seizures or epileptiform discharges - No metabolic or electrolyte derangement to explain her presentation, pC02 wnl  - She continues to be drowsy and lethargic - Neurology following, initiated empirical meningitis coverage and plan for continuous EEG - Unable to perform LP due to thrombocytopenia - Continue vancomycin , Rocephin , ampicillin  and acyclovir  - Follow-up blood cultures, UDS, urinalysis, paraneoplastic labs - Follow-up TTE with bubble study - Continue IV fluids - Trend CBC, fever curve - Frequent neurochecks - PT/OT/SLP eval - Delirium, fall and aspiration precaution - Avoid centrally acting agents  # Recent dog bite - Per family, patient was bit by dog on 5/15 and currently on Augmentin  and as needed Percocet.   - Site of dog bite healing appropriately without any evidence of cellulitis or abscess - Continue antibiotics as above  # Paroxysmal A-fib - Initially presented in normal sinus but reverted back in A-fib this morning - HR elevated today 100s to 110s, pt too drowsy to take nadolol  this morning - PRN IV metoprolol  with hold parameters - Telemetry  # NASH cirrhosis # Thrombocytopenia - MELD score of 13.   - LFTs normal, Platelet of 80, around baseline, normal ammonia - Hepatic encephalopathy low in the differential - Trend CBC, CMP  # T2DM # Diabetic neuropathy - A1c of 5.1% - SSI, CBG monitoring - Continue to hold gabapentin   # HLD - Continue Zetia  - Follow-up lipid panel  # Moderate persistent asthma - Continue home bronchodilators and as needed albuterol  nebs  # OSA - Continue CPAP at night  # Obesity: Estimated body  mass index is 40.12 kg/m as calculated from the following:   Height as of 07/12/23: 5' 3.75" (1.619 m).   Weight as of this encounter: 105.2 kg.  Condition -not improving  Family Communication  : Discussed plan with family at bedside  Code Status : Full  Consults  : Neurology  PUD Prophylaxis : None   Procedures  :     None      Disposition Plan  :    Status is: Inpatient Remains inpatient appropriate because: Acute encephalopathy, acute stroke  DVT Prophylaxis  :    SCDs Start: 08/25/23 1931 apixaban  (ELIQUIS ) tablet 5 mg     Lab Results  Component Value Date   PLT 80 (L) 08/26/2023    Diet :  Diet Order             Diet regular Room service appropriate? Yes; Fluid consistency: Thin  Diet effective now                    Inpatient Medications  Scheduled Meds:  amoxicillin -clavulanate  1 tablet Oral Q12H   apixaban   5 mg Oral BID   arformoterol   15 mcg Nebulization BID   And   umeclidinium bromide   1 puff Inhalation Daily   ezetimibe   10 mg Oral QHS   insulin  aspart  0-6 Units Subcutaneous TID WC   nadolol   40 mg Oral Daily   Continuous Infusions: PRN Meds:.acetaminophen  **OR** acetaminophen , albuterol , fentaNYL  (SUBLIMAZE ) injection, melatonin, naLOXone  (NARCAN )  injection, ondansetron  (ZOFRAN ) IV  Antibiotics  :    Anti-infectives (From admission, onward)    Start     Dose/Rate Route Frequency Ordered Stop   08/26/23 0800  amoxicillin -clavulanate (AUGMENTIN ) 875-125 MG per tablet 1 tablet        1 tablet Oral Every 12 hours 08/25/23 2316           Objective:   Vitals:   08/26/23 0500 08/26/23 0515 08/26/23 0530 08/26/23 0542  BP: 101/69 120/82 101/73   Pulse:  (!) 101 87   Resp: (!) 27 (!) 31 (!) 21   Temp:    99 F (37.2 C)  TempSrc:    Oral  SpO2:  (!) 87% 100%   Weight:        Wt Readings from Last 3 Encounters:  08/25/23 105.2 kg  08/01/23 100.8 kg  07/12/23 99.3 kg    No intake or output data in the 24  hours ending 08/26/23 4782   Physical Exam  General: Drowsy appearing elderly woman laying in bed. No acute distress. HEENT: Impact/AT. Anicteric sclera. Dry lips. CV: Tachycardic. Irregularly irregular rhythm. No murmurs, rubs, or gallops. No LE edema Pulmonary: Lungs CTAB. Normal effort. No wheezing or rales. Extremities: Palpable radial and DP pulses. Normal ROM. Skin: Warm and dry. Left arm dog bite site healing appropriately Neuro: Somnolent. Eyes closed and does not open spontaneously. Does not follow any commands. Localizes to noxious stimuli.  Psych: Normal mood and affect    RN pressure injury documentation:      Data Review:    Recent Labs  Lab 08/24/23 1154 08/24/23 1415 08/25/23 1735 08/25/23 1736 08/25/23 2132 08/26/23 0406  WBC 4.6  --  5.5  --   --  4.9  HGB 11.8* 12.6 11.6* 12.2 13.6 12.5  HCT 37.5 37.0 37.8 36.0 40.0 40.4  PLT 90*  --  92*  --   --  80*  MCV 96.4  --  100.0  --   --  99.5  MCH 30.3  --  30.7  --   --  30.8  MCHC 31.5  --  30.7  --   --  30.9  RDW 14.7  --  15.0  --   --  14.8  LYMPHSABS 1.4  --  1.5  --   --  1.4  MONOABS 0.6  --  0.9  --   --  0.7  EOSABS 0.1  --  0.1  --   --  0.1  BASOSABS 0.0  --  0.0  --   --  0.0    Recent Labs  Lab 08/24/23 1154 08/24/23 1337 08/24/23 1415 08/25/23 1735 08/25/23 1736 08/25/23 2125 08/25/23 2132 08/26/23 0406  NA 137  --  139 139 140  --  140 141  K 4.6  --  5.2* 4.9 4.8  --  4.8 5.1  CL 102  --   --  103 101  --   --  103  CO2 29  --   --  30  --   --   --  31  ANIONGAP 6  --   --  6  --   --   --  7  GLUCOSE 138*  --   --  143* 136*  --   --  95  BUN 32*  --   --  23 29*  --   --  22  CREATININE 1.03*  --   --  0.89 0.90  --   --  0.86  AST 25  --   --  21  --   --   --  19  ALT 7  --   --  7  --   --   --  7  ALKPHOS 46  --   --  41  --   --   --  45  BILITOT 1.2  --   --  1.1  --   --   --  1.3*  ALBUMIN  2.8*  --   --  2.7*  --   --   --  3.0*  LATICACIDVEN  --   --   --   --   0.6  --   --   --   INR  --   --   --  1.8*  --   --   --  1.5*  TSH  --   --   --   --   --   --   --  1.350  HGBA1C  --   --   --   --   --   --   --  5.1  AMMONIA  --  29  --   --   --  23  --  29  MG  --   --   --   --   --  2.2  --  2.4  CALCIUM  8.6*  --   --  8.6*  --   --   --  9.0      Recent Labs  Lab 08/24/23 1154 08/24/23 1337 08/25/23 1735 08/25/23 1736 08/25/23 2125 08/26/23 0406  LATICACIDVEN  --   --   --  0.6  --   --   INR  --   --  1.8*  --   --  1.5*  TSH  --   --   --   --   --  1.350  HGBA1C  --   --   --   --   --  5.1  AMMONIA  --  29  --   --  23 29  MG  --   --   --   --  2.2 2.4  CALCIUM  8.6*  --  8.6*  --   --  9.0    --------------------------------------------------------------------------------------------------------------- Lab Results  Component Value Date   CHOL 176 05/20/2023   HDL 39 (L) 05/20/2023   LDLCALC 118 (H) 05/20/2023   TRIG 106 05/20/2023   CHOLHDL 4.5 (H) 05/20/2023    Lab Results  Component Value Date   HGBA1C 5.1 08/26/2023   Recent Labs    08/26/23 0406  TSH 1.350   Recent Labs    08/26/23 0438  VITAMINB12 681   ------------------------------------------------------------------------------------------------------------------ Cardiac Enzymes No results for input(s): "CKMB", "TROPONINI", "MYOGLOBIN" in the last 168 hours.  Invalid input(s): "CK"  Micro Results Recent Results (from the past 240 hours)  Resp panel by RT-PCR (RSV, Flu A&B, Covid) Anterior Nasal Swab     Status: None   Collection Time: 08/25/23  6:23 PM   Specimen: Anterior Nasal Swab  Result Value Ref Range Status   SARS Coronavirus 2 by RT PCR NEGATIVE NEGATIVE Final   Influenza A by PCR NEGATIVE NEGATIVE Final   Influenza B by PCR NEGATIVE NEGATIVE Final    Comment: (NOTE) The Xpert Xpress SARS-CoV-2/FLU/RSV plus assay is intended as an aid in the diagnosis of influenza from Nasopharyngeal swab specimens and should not be used as a sole  basis for treatment. Nasal washings and aspirates are unacceptable for Xpert Xpress SARS-CoV-2/FLU/RSV testing.  Fact Sheet for Patients: BloggerCourse.com  Fact Sheet for Healthcare Providers: SeriousBroker.it  This test is not yet approved or cleared by the [place]  FDA and has been authorized for detection and/or diagnosis of SARS-CoV-2 by FDA under an Emergency Use Authorization (EUA). This EUA will remain in effect (meaning this test can be used) for the duration of the COVID-19 declaration under Section 564(b)(1) of the Act, 21 U.S.C. section 360bbb-3(b)(1), unless the authorization is terminated or revoked.     Resp Syncytial Virus by PCR NEGATIVE NEGATIVE Final    Comment: (NOTE) Fact Sheet for Patients: BloggerCourse.com  Fact Sheet for Healthcare Providers: SeriousBroker.it  This test is not yet approved or cleared by the [place]  FDA and has been authorized for detection and/or diagnosis of SARS-CoV-2 by FDA under an Emergency Use Authorization (EUA). This EUA will remain in effect (meaning this test can be used) for the duration of the COVID-19 declaration under Section 564(b)(1) of the Act, 21 U.S.C. section 360bbb-3(b)(1), unless the authorization is terminated or revoked.  Performed at [facility], [address] [place], [place] [address]     Radiology Reports MR BRAIN WO CONTRAST Result Date: 08/26/2023 CLINICAL DATA:  Neuro deficit, acute, stroke suspected. Altered mental status. EXAM: MRI HEAD WITHOUT CONTRAST TECHNIQUE: Multiplanar, multiecho pulse sequences of the brain and surrounding structures were obtained without intravenous contrast. COMPARISON:  Head CT 08/25/2023 and MRI 03/17/2023 FINDINGS: The study is intermittently up to moderately motion degraded. Brain: There is a 1 cm acute infarct in the subcortical white matter of the  posterior right frontal lobe. A few chronic cerebral microhemorrhages are nonspecific. Small T2 hyperintensities in the cerebral white matter nonspecific but compatible with mild chronic small vessel ischemic disease. There are small chronic infarcts involving the left caudate nucleus and cerebellum. A partially empty sella is unchanged. Cerebral volume is within normal limits for age. The ventricles are normal in size. No mass, midline shift, or extra-axial fluid collection is identified. Vascular: Major intracranial vascular flow voids are preserved. Skull and upper cervical spine: Unremarkable bone marrow signal. Sinuses/Orbits: Unremarkable orbits.  Mild mucosal thickening in the paranasal sinuses. Trace right mastoid fluid. Other: None. IMPRESSION: 1. Small acute posterior right frontal white matter infarct. 2. Mild chronic small vessel ischemic disease. Electronically Signed   By: Aundra Lee M.D.   On: 08/26/2023 07:26   DG Chest Portable 1 View Result Date: 08/25/2023 CLINICAL DATA:  Altered mental status. EXAM: PORTABLE CHEST 1 VIEW COMPARISON:  Radiograph 08/24/2023 FINDINGS: Stable cardiomegaly. Loop recorder. AVR. Sternotomy. Aortic atherosclerotic calcification. Pulmonary vascular congestion. Prominent central pulmonary arteries. No focal consolidation, pleural effusion, or pneumothorax. IMPRESSION: Cardiomegaly with pulmonary vascular congestion. Electronically Signed   By: Rozell Cornet M.D.   On: 08/25/2023 19:36   CT HEAD CODE STROKE WO CONTRAST Result Date: 08/25/2023 CLINICAL DATA:  Code stroke.  Dysphasia EXAM: CT HEAD WITHOUT CONTRAST TECHNIQUE: Contiguous axial images were obtained from the base of the skull through the vertex without intravenous contrast. RADIATION DOSE REDUCTION: This exam was performed according to the departmental dose-optimization program which includes automated exposure control, adjustment of the mA and/or kV according to patient size and/or use of iterative  reconstruction technique. COMPARISON:  CT head without contrast 08/24/2023. MRI of the head without contrast 03/17/2023 FINDINGS: Brain: No acute infarct, hemorrhage, or mass lesion is present. Mild periventricular white matter changes are stable. A remote lacunar infarct is again noted in the left caudate head. The deep brain nuclei are otherwise within normal limits. No acute or focal cortical abnormalities are present. The ventricles are of normal size. No significant extraaxial fluid collection is present. An enlarged relatively empty sella is present. Midline structures are otherwise unremarkable. Vascular: Atherosclerotic calcifications are present within the cavernous internal carotid arteries bilaterally. No hyperdense vessel is present. Skull: Calvarium is intact. No focal lytic or blastic lesions are present. No significant extracranial soft tissue lesion is present. Sinuses/Orbits: The paranasal sinuses and mastoid air cells are clear. The globes and orbits are within normal limits. Other: Diffuse fatty infiltration of the parotid glands is again noted bilaterally. Multiple punctate calcifications are present in both parotid glands. ASPECTS [facility] Stroke Program Early CT Score) - Ganglionic level infarction (caudate, lentiform nuclei, internal capsule, insula, M1-M3 cortex): 7/7 - Supraganglionic infarction (M4-M6 cortex): 3/3 Total score (0-10 with 10 being normal): 10/10 IMPRESSION: 1. No acute intracranial abnormality or significant interval change. 2. Stable mild periventricular white matter disease. This likely reflects the sequela of chronic microvascular ischemia. 3. Remote lacunar infarct of the left caudate head. 4. Aspects is 10/10. 5. Diffuse fatty infiltration of the parotid glands bilaterally with multiple punctate calcifications. This suggests Sjogren disease The above was relayed via text pager to Dr. Cleone Dad on 08/25/2023 at 17:48 . Electronically Signed   By: Audree Leas M.D.    On: 08/25/2023 17:48   DG Chest 2 View Result Date: 08/24/2023 CLINICAL DATA:  Chest pain. EXAM: CHEST - 2 VIEW COMPARISON:  10/25/2022. FINDINGS: Heart is enlarged and the mediastinal contour is within normal limits. Left atrial appendage clip and prosthetic valve are noted. Lung volumes are low resulting in vascular crowding. No consolidation, effusion, or pneumothorax. Sternotomy wires are present over the midline. Degenerative changes are seen in the thoracic spine. IMPRESSION: No active cardiopulmonary disease. Electronically Signed   By: Wyvonnia Heimlich M.D.   On: 08/24/2023 16:03   CT Head Wo Contrast Result Date: 08/24/2023 CLINICAL DATA:  Mental status change, unknown cause. EXAM: CT HEAD WITHOUT CONTRAST TECHNIQUE: Contiguous axial images were obtained from the base of the skull through the vertex without intravenous  contrast. RADIATION DOSE REDUCTION: This exam was performed according to the departmental dose-optimization program which includes automated exposure control, adjustment of the mA and/or kV according to patient size and/or use of iterative reconstruction technique. COMPARISON:  03/17/2023. FINDINGS: Brain: No acute intracranial hemorrhage, midline shift or mass effect. No extra-axial fluid collection. Mild periventricular white matter hypodensities are present bilaterally. No hydrocephalus. Vascular: No hyperdense vessel or unexpected calcification. Skull: Normal. Negative for fracture or focal lesion. Sinuses/Orbits: There is partial opacification of the mastoid air cells bilaterally. Mucosal thickening is present in the maxillary sinuses. No acute orbital abnormality. Other: None. IMPRESSION: 1. No acute intracranial process. 2. Mild chronic microvascular ischemic changes. Electronically Signed   By: Wyvonnia Heimlich M.D.   On: 08/24/2023 15:59      Signature  -   Vita Grip M.D on 08/26/2023 at 8:11 AM   -  To page go to [email or web address]

## 2023-08-26 NOTE — Progress Notes (Signed)
 CSW received consult from RN requesting CSW speak with patient's daughter Whitney [name]. Whitney [name] states patient resides with patient's son Whitney [name]. Whitney [name] states neither she or her brother Whitney [name] are patient's POA at this time. Whitney [name] states she is agreeable for CSW to initiate SNF workup once PT recommendations are available. CSW informed Whitney [name] of limitations with patient's insurance [employer]) for LTC. Whitney [name] states understanding and all questions were answered.  TOC will continue to follow for discharge planning purposes.  [name], MSW, LCSW Transitions of Care  Clinical Social Worker II [phone]

## 2023-08-26 NOTE — ED Notes (Signed)
Unable to obtain blood cultures. MD notified.

## 2023-08-26 NOTE — Progress Notes (Signed)
 NEUROLOGY CONSULT FOLLOW UP NOTE   Date of service: August 26, 2023 Patient Name: Whitney [name] MRN:  [id number] DOB:  November 22, 1955  Interval Hx/subjective   MRI reveals small stroke in right frontal lobe.  According to patient's family, she has been getting progressively drowsier all week and has definitely missed her medications, including Eliquis , at least twice.  She had a low-grade fever last night, and per family has had low-grade fevers on and off all week. Vitals   Vitals:   08/26/23 0500 08/26/23 0515 08/26/23 0530 08/26/23 0542  BP: 101/69 120/82 101/73   Pulse:  (!) 101 87   Resp: (!) 27 (!) 31 (!) 21   Temp:    99 F (37.2 C)  TempSrc:    Oral  SpO2:  (!) 87% 100%   Weight:         Body mass index is 40.12 kg/m.  Physical Exam   Constitutional: Ill-appearing elderly patient in no acute distress Psych: Affect blunted Eyes: No scleral injection.  HENT: No OP obstrucion.  Head: Normocephalic.  Cardiovascular: Normal rate and regular rhythm, some ventricular ectopy noted on monitor.  Respiratory: Effort normal, non-labored breathing.  Skin: Bruising and healing animal bite noted to left arm  Neurologic Examination    NEURO:  Mental Status: Patient is drowsy and does not respond to name or follow commands.  At times she appears to be uncooperative with exam, for example looks away when asked to look towards examiner.  She is able to localize sternal rub with bilateral upper extremities Speech/Language: No verbal output  Cranial Nerves:  II: PERRL.  Blinks to threat bilaterally III, IV, VI: EOMI. Eyelids elevate symmetrically.  VII: Face is symmetrical resting VIII: hearing intact to voice. XII: Noncooperative with tongue protrusion Motor: Moves bilateral upper extremities with antigravity strength, moves lower extremities but does not lift them off the bed Tone: is normal and bulk is normal Sensation-appears to be intact to touch on the upper extremities, intact  to noxious on the lower extremities   Coordination: Unable to perform Gait- deferred  NIHSS:  1a Level of Conscious.: 1 1b LOC Questions: 2 1c LOC Commands: 2 2 Best Gaze: 0 3 Visual: 0 4 Facial Palsy: 0 5a Motor Arm - left: 2 5b Motor Arm - Right: 2 6a Motor Leg - Left: 3 6b Motor Leg - Right: 3 7 Limb Ataxia: 0 8 Sensory: 0 9 Best Language: 3 10 Dysarthria: 2 11 Extinct and Inattention.: 0 TOTAL: 20     Medications  Current Facility-Administered Medications:    [START ON 08/27/2023]  stroke: early stages of recovery book, , Does not apply, Once, de La Torre, Cortney E, NP   acetaminophen  (TYLENOL ) tablet 650 mg, 650 mg, Oral, Q6H PRN **OR** acetaminophen  (TYLENOL ) suppository 650 mg, 650 mg, Rectal, Q6H PRN, Howerter, Justin B, DO   albuterol  (PROVENTIL ) (2.5 MG/3ML) 0.083% nebulizer solution 2.5 mg, 2.5 mg, Nebulization, Q4H PRN, Howerter, Justin B, DO   amoxicillin -clavulanate (AUGMENTIN ) 875-125 MG per tablet 1 tablet, 1 tablet, Oral, Q12H, Howerter, Justin B, DO   apixaban  (ELIQUIS ) tablet 5 mg, 5 mg, Oral, BID, Howerter, Justin B, DO   arformoterol  (BROVANA ) nebulizer solution 15 mcg, 15 mcg, Nebulization, BID, 15 mcg at 08/26/23 0815 **AND** umeclidinium bromide  (INCRUSE ELLIPTA ) 62.5 MCG/ACT 1 puff, 1 puff, Inhalation, Daily, Howerter, Justin B, DO   ezetimibe  (ZETIA ) tablet 10 mg, 10 mg, Oral, QHS, Howerter, Justin B, DO   fentaNYL  (SUBLIMAZE ) injection 25 mcg, 25 mcg,  Intravenous, Q2H PRN, Howerter, Justin B, DO   insulin  aspart (novoLOG ) injection 0-6 Units, 0-6 Units, Subcutaneous, TID WC, Howerter, Justin B, DO   melatonin tablet 3 mg, 3 mg, Oral, QHS PRN, Howerter, Justin B, DO   nadolol  (CORGARD ) tablet 40 mg, 40 mg, Oral, Daily, Howerter, Justin B, DO   naloxone  (NARCAN ) injection 0.4 mg, 0.4 mg, Intravenous, PRN, Howerter, Justin B, DO   ondansetron  (ZOFRAN ) injection 4 mg, 4 mg, Intravenous, Q6H PRN, Howerter, Justin B, DO  Current Outpatient Medications:     OZEMPIC, 1 MG/DOSE, 4 MG/3ML SOPN, INJECT 1 MG SUBCUTANEOUSLY ONE TIME PER WEEK 84 DAYS, Disp: , Rfl:    albuterol  (PROVENTIL ) (2.5 MG/3ML) 0.083% nebulizer solution, Take 3 mLs (2.5 mg total) by nebulization every 6 (six) hours as needed for wheezing or shortness of breath., Disp: 75 mL, Rfl: 5   albuterol  (VENTOLIN  HFA) 108 (90 Base) MCG/ACT inhaler, INHALE 2 PUFFS INTO THE LUNGS EVERY 4 HOURS AS NEEDED FOR WHEEZING OR SHORTNESS OF BREATH., Disp: 8.5 each, Rfl: 5   amoxicillin -clavulanate (AUGMENTIN ) 875-125 MG tablet, Take 1 tablet by mouth every 12 (twelve) hours., Disp: 14 tablet, Rfl: 0   ELIQUIS  5 MG TABS tablet, TAKE 1 TABLET BY MOUTH TWICE A DAY, Disp: 90 tablet, Rfl: 2   Evolocumab  (REPATHA  SURECLICK) 140 MG/ML SOAJ, Inject 140 mg into the skin every 14 (fourteen) days., Disp: 6 mL, Rfl: 3   ezetimibe  (ZETIA ) 10 MG tablet, Take 10 mg by mouth at bedtime. , Disp: , Rfl: 11   FARXIGA  10 MG TABS tablet, Take 1 tablet (10 mg total) by mouth daily before breakfast., Disp: 90 tablet, Rfl: 3   furosemide  (LASIX ) 20 MG tablet, Take 2 tablets (40 mg total) by mouth daily., Disp: 90 tablet, Rfl: 3   gabapentin  (NEURONTIN ) 100 MG capsule, Take 1 capsule (100 mg total) by mouth 2 (two) times daily at 10 am and 4 pm AND 3 capsules (300 mg total) at bedtime., Disp: 150 capsule, Rfl: 5   hydroxychloroquine  (PLAQUENIL ) 200 MG tablet, Take 1 tablet (200 mg total) by mouth daily., Disp: 30 tablet, Rfl: 2   MAGNESIUM  GLUCONATE PO, Take 1 tablet by mouth daily., Disp: , Rfl:    nadolol  (CORGARD ) 40 MG tablet, TAKE 1 TABLET BY MOUTH EVERY DAY, Disp: 30 tablet, Rfl: 0   oxyCODONE -acetaminophen  (PERCOCET/ROXICET) 5-325 MG tablet, Take 1 tablet by mouth every 6 (six) hours as needed for severe pain (pain score 7-10)., Disp: 15 tablet, Rfl: 0   potassium chloride  SA (KLOR-CON  M) 20 MEQ tablet, Take 1 tablet (20 mEq total) by mouth daily., Disp: 90 tablet, Rfl: 3   tadalafil , PAH, (ADCIRCA ) 20 MG tablet, Take 2  tablets (40 mg total) by mouth daily., Disp: 60 tablet, Rfl: 11   Tiotropium Bromide-Olodaterol (STIOLTO RESPIMAT ) 2.5-2.5 MCG/ACT AERS, INHALE 2 PUFFS INTO THE LUNGS DAILY. INHALE 2 PUFFS BY MOUTH INTO THE LUNGS DAILY, Disp: 12 g, Rfl: 0  Labs and Diagnostic Imaging   CBC:  Recent Labs  Lab 08/25/23 1735 08/25/23 1736 08/25/23 2132 08/26/23 0406  WBC 5.5  --   --  4.9  NEUTROABS 3.0  --   --  2.6  HGB 11.6*   < > 13.6 12.5  HCT 37.8   < > 40.0 40.4  MCV 100.0  --   --  99.5  PLT 92*  --   --  80*   < > = values in this interval not displayed.    Basic Metabolic  Panel:  Lab Results  Component Value Date   NA 141 08/26/2023   K 5.1 08/26/2023   CO2 31 08/26/2023   GLUCOSE 95 08/26/2023   BUN 22 08/26/2023   CREATININE 0.86 08/26/2023   CALCIUM  9.0 08/26/2023   GFRNONAA >60 08/26/2023   GFRAA >60 12/17/2018   Lipid Panel:  Lab Results  Component Value Date   LDLCALC 118 (H) 05/20/2023   HgbA1c:  Lab Results  Component Value Date   HGBA1C 5.1 08/26/2023   Urine Drug Screen:     Component Value Date/Time   LABOPIA NONE DETECTED 04/13/2022 1651   COCAINSCRNUR NONE DETECTED 04/13/2022 1651   LABBENZ NONE DETECTED 04/13/2022 1651   AMPHETMU NONE DETECTED 04/13/2022 1651   THCU NONE DETECTED 04/13/2022 1651   LABBARB NONE DETECTED 04/13/2022 1651    Alcohol Level     Component Value Date/Time   ETH <10 08/25/2023 1735   INR  Lab Results  Component Value Date   INR 1.5 (H) 08/26/2023   APTT  Lab Results  Component Value Date   APTT 41 (H) 08/25/2023   Ammonia 29 B12 681 TSH 1.350  CT Head without contrast(Personally reviewed): No acute abnormality  CT angio Head and Neck with contrast(Personally reviewed): Pending  MRI Brain(Personally reviewed): Small acute posterior right frontal white matter infarct  rEEG:  Pending  Assessment   Whitney [name] is a 68 y.o. female with a history of Sjogren syndrome, diabetes, fibromyalgia, depression,  GERD, aortic valve replacement, A-fib on Eliquis , TIA, NASH cirrhosis, pulmonary hypertension, thrombocytopenia and home O2 use and recent dog bite who presented with altered mental status.  According to patient's family, she has been getting progressively more and more drowsy all week and has been sleeping more and more.  Her son states that he has been checking her vital signs and that she has had low-grade fevers on and off throughout the week.  She was seen for a dog bite and was prescribed antibiotics which she has been taking as well as Norco, which her son states she did not pick up from the pharmacy.  Son states that she has missed her medications at least twice this week due to drowsiness.  Suspect that stroke is embolic in etiology in the setting of A-fib with a missed dose of Eliquis .  However, small stroke does not explain patient's altered mental status.  She does not have any seizure risk factors, has never had a seizure, has no family members with seizures, has never had meningitis or encephalitis, has never had a serious head injury or intracranial surgery per family but did have a prolonged illness after a tick bite many years ago.  Toxic metabolic encephalopathy could explain patient's altered mental status, but she has no leukocytosis and Tmax has been 99.9 while she has been here.  Will obtain routine EEG to rule out seizure activity as the cause of patient's altered mental status.  Also will start patient on appropriate antimicrobial coverage for meningitis given low-grade fevers and increasing altered mental status over the course of the last week.  Did not want to perform lumbar puncture at this time due to anticoagulation with Eliquis  and thrombocytopenia.  Recommendations   - Permissive HTN x48 hrs goal BP <220/110. PRN labetalol  or hydralazine  if BP above these parameters. Avoid oral antihypertensives. - CTA head and neck - TTE - Check A1c and LDL + add statin per guidelines -  Continue anticoagulation with Eliquis  - q4 hr neuro checks -  STAT head CT for any change in neuro exam - Tele - PT/OT/SLP - Stroke education - Amb referral to neurology upon discharge   - LTM EEG 2/2 extreme lethargy - Serum paraneoplastic panel -Workup for causes of toxic metabolic encephalopathy per primary team -As lumbar puncture cannot be performed, will start antimicrobial coverage for meningitis with vancomycin , ceftriaxone , ampicillin  and acyclovir  ______________________________________________________________________  Patient seen by NP and then by MD, MD to edit note as needed.  Signed, Cortney E Bucky Cardinal, NP [facility]    Attending Neurohospitalist Addendum Patient seen and examined with APP/Resident. Agree with the history and physical as documented above. Agree with the plan as documented, which I helped formulate. I have edited the note above to reflect my full findings and recommendations. I have independently reviewed the chart, obtained history, review of systems and examined the patient.I have personally reviewed pertinent head/neck/spine imaging (CT/MRI). Please feel free to call with any questions.  -- Greg Leaks, MD [facility] [phone]  If 7pm- 7am, please page neurology on call as listed in AMION.

## 2023-08-26 NOTE — ED Notes (Signed)
CBG 89 

## 2023-08-26 NOTE — Progress Notes (Signed)
 Pt is in CT will try back for EEG as schedule permits

## 2023-08-26 NOTE — Progress Notes (Addendum)
 Pharmacy Antibiotic Note  Whitney [name] is a 68 y.o. female admitted on 08/25/2023 with meningitis.  Pharmacy has been consulted for vancomycin  and acyclovir  dosing. Ceftriaxone  and ampicillin  dosing per MD.   4/21: WBC 4.9, Scr 0.86, Afebrile, HR 108, RR 22  Plan: Vancomycin  2000 mg IV x1, then vancomycin  750 mg IV every 12 hours Acyclovir  700 mg IV every 8 hours Ceftriaxone  and ampicillin  per MD Monitor renal function and vancomycin  levels as appropriate F/u culture results and clinical progression  Weight: 105.2 kg (231 lb 14.8 oz)  Temp (24hrs), Avg:99 F (37.2 C), Min:98.4 F (36.9 C), Max:99.9 F (37.7 C)  Recent Labs  Lab 08/24/23 1154 08/25/23 1735 08/25/23 1736 08/26/23 0406  WBC 4.6 5.5  --  4.9  CREATININE 1.03* 0.89 0.90 0.86  LATICACIDVEN  --   --  0.6  --     Estimated Creatinine Clearance: 71.6 mL/min (by C-G formula based on SCr of 0.86 mg/dL).    Allergies  Allergen Reactions   Prednisone      Other Reaction(s): delerium   Doxycycline Hives and Rash   Naproxen Rash and Hives    Antimicrobials this admission: Ceftriaxone  4/21 >> Ampicillin  4/21 >> Vancomycin  4/21 >> Acyclovir  4/21 >>  Microbiology results: 4/20 RVP (-)  Thank you for allowing pharmacy to be a part of this patient's care.  Volney Grumbles, PharmD PGY-1 Acute Care Pharmacy Resident 08/26/2023 11:08 AM

## 2023-08-26 NOTE — ED Notes (Signed)
 Grandaughter carrie called for an update [phone]

## 2023-08-27 ENCOUNTER — Inpatient Hospital Stay (HOSPITAL_COMMUNITY)

## 2023-08-27 ENCOUNTER — Encounter (HOSPITAL_COMMUNITY)

## 2023-08-27 DIAGNOSIS — Z7901 Long term (current) use of anticoagulants: Secondary | ICD-10-CM | POA: Diagnosis not present

## 2023-08-27 DIAGNOSIS — G9340 Encephalopathy, unspecified: Secondary | ICD-10-CM | POA: Diagnosis not present

## 2023-08-27 DIAGNOSIS — R569 Unspecified convulsions: Secondary | ICD-10-CM | POA: Diagnosis not present

## 2023-08-27 DIAGNOSIS — I4891 Unspecified atrial fibrillation: Secondary | ICD-10-CM | POA: Diagnosis not present

## 2023-08-27 DIAGNOSIS — D696 Thrombocytopenia, unspecified: Secondary | ICD-10-CM | POA: Diagnosis not present

## 2023-08-27 DIAGNOSIS — I6389 Other cerebral infarction: Secondary | ICD-10-CM

## 2023-08-27 DIAGNOSIS — R4182 Altered mental status, unspecified: Secondary | ICD-10-CM | POA: Diagnosis not present

## 2023-08-27 DIAGNOSIS — I639 Cerebral infarction, unspecified: Secondary | ICD-10-CM | POA: Diagnosis not present

## 2023-08-27 LAB — ECHOCARDIOGRAM COMPLETE BUBBLE STUDY
AR max vel: 1.25 cm2
AV Area VTI: 1.33 cm2
AV Area mean vel: 1.37 cm2
AV Mean grad: 13.8 mmHg
AV Peak grad: 24.6 mmHg
Ao pk vel: 2.48 m/s
Area-P 1/2: 3.95 cm2
Calc EF: 62.6 %
Est EF: >75
MV M vel: 6.03 m/s
MV Peak grad: 145.4 mmHg
MV VTI: 1.52 cm2
S' Lateral: 2.8 cm
Single Plane A2C EF: 69.1 %
Single Plane A4C EF: 59.9 %

## 2023-08-27 LAB — GLUCOSE, CAPILLARY
Glucose-Capillary: 114 mg/dL — ABNORMAL HIGH (ref 70–99)
Glucose-Capillary: 102 mg/dL — ABNORMAL HIGH (ref 70–99)
Glucose-Capillary: 114 mg/dL — ABNORMAL HIGH (ref 70–99)
Glucose-Capillary: 131 mg/dL — ABNORMAL HIGH (ref 70–99)
Glucose-Capillary: 134 mg/dL — ABNORMAL HIGH (ref 70–99)
Glucose-Capillary: 103 mg/dL — ABNORMAL HIGH (ref 70–99)

## 2023-08-27 LAB — COMPREHENSIVE METABOLIC PANEL WITH GFR
ALT: 7 U/L (ref 0–44)
AST: 18 U/L (ref 15–41)
Albumin: 2.7 g/dL — ABNORMAL LOW (ref 3.5–5.0)
Alkaline Phosphatase: 41 U/L (ref 38–126)
Anion gap: 8 (ref 5–15)
BUN: 28 mg/dL — ABNORMAL HIGH (ref 8–23)
CO2: 25 mmol/L (ref 22–32)
Calcium: 8.7 mg/dL — ABNORMAL LOW (ref 8.9–10.3)
Chloride: 108 mmol/L (ref 98–111)
Creatinine, Ser: 0.96 mg/dL (ref 0.44–1.00)
GFR, Estimated: >60 mL/min (ref >60)
Glucose, Bld: 115 mg/dL — ABNORMAL HIGH (ref 70–99)
Potassium: 4.6 mmol/L (ref 3.5–5.1)
Sodium: 141 mmol/L (ref 135–145)
Total Bilirubin: 1.2 mg/dL (ref 0.0–1.2)
Total Protein: 5.9 g/dL — ABNORMAL LOW (ref 6.5–8.1)

## 2023-08-27 LAB — LIPID PANEL
Cholesterol: 124 mg/dL (ref 0–200)
HDL: 29 mg/dL — ABNORMAL LOW (ref >40)
LDL Cholesterol: 74 mg/dL (ref 0–99)
Total CHOL/HDL Ratio: 4.3 ratio
Triglycerides: 106 mg/dL (ref <150)
VLDL: 21 mg/dL (ref 0–40)

## 2023-08-27 LAB — CBC
HCT: 40.2 % (ref 36.0–46.0)
Hemoglobin: 12.4 g/dL (ref 12.0–15.0)
MCH: 30.2 pg (ref 26.0–34.0)
MCHC: 30.8 g/dL (ref 30.0–36.0)
MCV: 97.8 fL (ref 80.0–100.0)
Platelets: 99 10*3/uL — ABNORMAL LOW (ref 150–400)
RBC: 4.11 MIL/uL (ref 3.87–5.11)
RDW: 15.2 % (ref 11.5–15.5)
WBC: 5.5 10*3/uL (ref 4.0–10.5)
nRBC: 0 % (ref 0.0–0.2)

## 2023-08-27 MED ORDER — HYDROXYZINE HCL 25 MG PO TABS
25.0000 mg | ORAL_TABLET | Freq: Once | ORAL | Status: AC
Start: 2023-08-27 — End: 2023-08-27
  Administered 2023-08-27: 25 mg via ORAL
  Filled 2023-08-27: qty 1

## 2023-08-27 MED ORDER — HYDROXYCHLOROQUINE SULFATE 200 MG PO TABS
200.0000 mg | ORAL_TABLET | Freq: Every day | ORAL | Status: DC
  Administered 2023-08-27 – 2023-09-08 (×13): 200 mg via ORAL
  Filled 2023-08-27 (×13): qty 1

## 2023-08-27 MED ORDER — TADALAFIL 20 MG PO TABS
20.0000 mg | ORAL_TABLET | Freq: Two times a day (BID) | ORAL | Status: DC
  Administered 2023-08-27 – 2023-08-31 (×8): 20 mg via ORAL
  Filled 2023-08-27 (×11): qty 1

## 2023-08-27 MED ORDER — INSULIN ASPART 100 UNIT/ML IJ SOLN: 0.0000 [IU] | Freq: Three times a day (TID) | INTRAMUSCULAR | Status: DC

## 2023-08-27 MED ORDER — LIDOCAINE 5 % EX PTCH
1.0000 | MEDICATED_PATCH | CUTANEOUS | Status: DC
  Administered 2023-08-27 – 2023-09-13 (×9): 1 via TRANSDERMAL
  Filled 2023-08-27 (×13): qty 1

## 2023-08-27 MED ORDER — ROSUVASTATIN CALCIUM 20 MG PO TABS
10.0000 mg | ORAL_TABLET | Freq: Every day | ORAL | Status: DC
  Administered 2023-08-27 – 2023-09-08 (×13): 10 mg via ORAL
  Filled 2023-08-27: qty 1
  Filled 2023-08-27: qty 2
  Filled 2023-08-27 (×2): qty 1
  Filled 2023-08-27: qty 2
  Filled 2023-08-27 (×2): qty 1
  Filled 2023-08-27: qty 2
  Filled 2023-08-27: qty 1
  Filled 2023-08-27 (×2): qty 2
  Filled 2023-08-27 (×2): qty 1

## 2023-08-27 NOTE — Progress Notes (Signed)
 SLP Cancellation Note  Patient Details Name: Whitney [name] MRN: [id number] DOB: May 21, 1955   Cancelled treatment:       Reason Eval/Treat Not Completed: Patient at procedure or test/unavailable (echo). SLP will f/u at a later time.    Amil Kale, M.A., CCC-SLP Speech Language Pathology, Acute Rehabilitation Services  Secure Chat preferred [phone]  08/27/2023, 9:30 AM

## 2023-08-27 NOTE — Evaluation (Signed)
 Speech Language Pathology Evaluation Patient Details Name: Whitney [name] MRN: [id number] DOB: 16-Aug-1955 Today's Date: 08/27/2023 Time: 4010-2725 SLP Time Calculation (min) (ACUTE ONLY): 10 min  Problem List:  Patient Active Problem List   Diagnosis Date Noted   Altered mental status 08/26/2023   Acute encephalopathy 08/25/2023   Generalized weakness 08/25/2023   Dog bite 08/25/2023   Moderate persistent asthma 08/25/2023   Acute diastolic heart failure (HCC) 04/07/2023   Peripheral edema 04/07/2023   Acute hypoxemic respiratory failure (HCC) 03/15/2023   Chronic hypoxic respiratory failure (HCC) 03/14/2023   DM2 (diabetes mellitus, type 2) (HCC) 03/14/2023   Elevated troponin 03/14/2023   COPD with acute exacerbation (HCC) 03/12/2023   Acute respiratory failure (HCC) 03/12/2023   Elevated diaphragm 03/12/2023   History of TIA (transient ischemic attack) 03/12/2023   Hypercoagulable state due to paroxysmal atrial fibrillation (HCC) 05/08/2022   Acute cerebrovascular accident (CVA) (HCC) 04/13/2022   COPD (chronic obstructive pulmonary disease) (HCC) 01/18/2021   Pulmonary hypertension, unspecified (HCC) 06/15/2019   Dyspnea 01/06/2019   Paroxysmal atrial fibrillation (HCC) 01/24/2018   S/P AVR 12/05/2017   Thrombocytopenia (HCC) 11/13/2017   Other neutropenia (HCC) 11/13/2017   Numbness in feet 10/14/2017   Severe aortic stenosis    Inguinal hernia 10/22/2016   Obesity (BMI 30.0-34.9) 04/26/2016   Lower extremity edema 06/22/2015   Status post bariatric surgery 03/29/2014   Aortic valve disorder 04/21/2013   Anasarca 04/21/2013   Depression    OSA (obstructive sleep apnea)    Sjogren's syndrome (HCC)    Liver cirrhosis secondary to NASH (HCC)    HLD (hyperlipidemia)    Fibromyalgia    Postmenopausal bleeding 12/11/2012   Endometrial polyp 12/11/2012   NASH (nonalcoholic steatohepatitis) 08/06/2012   Chronic cough 04/11/2011   Past Medical History:  Past Medical  History:  Diagnosis Date   Allergic rhinitis    Anemia    hx   Arthritis    Asthma    hx yrs ago   Cirrhosis (HCC) last albumin  3.3 done at [facility] 06-16-2014 (under care everywhere tab in epic)   Secondary to Fatty liver --  followed by hepatology at [facility] (dr Orest Bio)    Depression    Diabetes mellitus type II    type 2 diet conrolled   Dyspnea    Fibromyalgia    GERD (gastroesophageal reflux disease)    Heart murmur    asymptomatic ---  1989 from rhuematic fever   History of exercise stress test    05-05-2013---  negative bruce ETT given exercise workload,  no ischemia   History of hiatal hernia    History of kidney stones    History of rheumatic fever    1989   Hyperlipidemia    Leukocytopenia    Moderate aortic stenosis    AVA area 1.1cm2---  cardiologist --  dr Armanda Lan, 2014 in epic   NASH (nonalcoholic steatohepatitis)    OSA (obstructive sleep apnea)    was using CPAP before gastric sleeve 2015--  no uses after wt loss   Pneumonia    hx   Pulmonary hypertension (HCC)    Sjogren's syndrome (HCC)    Thrombocytopenia (HCC)    Past Surgical History:  Past Surgical History:  Procedure Laterality Date   AORTIC VALVE REPLACEMENT N/A 12/05/2017   Procedure: AORTIC VALVE REPLACEMENT (AVR) TISSUE VALVE INSPIRIS;  Surgeon: Bartley Lightning, MD;  Location: [facility] OR;  Service: Open Heart Surgery;  Laterality: N/A;   COLONOSCOPY WITH  ESOPHAGOGASTRODUODENOSCOPY (EGD)     CYSTOSCOPY WITH RETROGRADE PYELOGRAM, URETEROSCOPY AND STENT PLACEMENT Left 01/21/2015   Procedure: CYSTOSCOPY WITH LEFT  RETROGRADE PYELOGRAM, LEFT URETEROSCOPY AND STENT PLACEMENT;  Surgeon: Christina Coyer, MD;  Location: [facility];  Service: Urology;  Laterality: Left;   CYSTOSCOPY WITH RETROGRADE PYELOGRAM, URETEROSCOPY AND STENT PLACEMENT Bilateral 02/24/2015   Procedure: CYSTOSCOPY WITH RIGHT RETROGRADE PYELOGRAM, BLADDER BIOPSY FULGERATION LEFT URETEROSCOPY AND STENT REPLACEMENT;   Surgeon: Christina Coyer, MD;  Location: [facility];  Service: Urology;  Laterality: Bilateral;   EXPLORATORY LAPAROSCOPY W/  CONE BIOPSY'S LEFT AND RIGHT LOBE OF LIVER  11-04-2007   HOLMIUM LASER APPLICATION Left 02/24/2015   Procedure: HOLMIUM LASER LITHOTRIPSY;  Surgeon: Christina Coyer, MD;  Location: [facility];  Service: Urology;  Laterality: Left;   HYSTEROSCOPY WITH D & C N/A 12/11/2012   Procedure: DILATATION AND CURETTAGE /HYSTEROSCOPY;  Surgeon: Lizette Righter. Wynona Hedger, MD;  Location: [facility];  Service: Gynecology;  Laterality: N/A;   INGUINAL HERNIA REPAIR Left 10/22/2016   Procedure: LEFT INGUINAL HERNIA REPAIR;  Surgeon: Enid Harry, MD;  Location: [facility] OR;  Service: General;  Laterality: Left;  TAP BLOCK   INSERTION OF MESH Left 10/22/2016   Procedure: INSERTION OF MESH;  Surgeon: Enid Harry, MD;  Location: [facility] OR;  Service: General;  Laterality: Left;   LAPAROSCOPIC GASTRIC SLEEVE RESECTION  07-27-2013   LOOP RECORDER INSERTION N/A 04/16/2022   Procedure: LOOP RECORDER INSERTION;  Surgeon: Verona Goodwill, MD;  Location[facility] INVASIVE CV LAB;  Service: Cardiovascular;  Laterality: N/A;   PUBOVAGINAL SLING  04-10-2001   [facility]   RIGHT HEART CATH N/A 04/11/2023   Procedure: RIGHT HEART CATH;  Surgeon: Darlis Eisenmenger, MD;  Location: [facility] INVASIVE CV LAB;  Service: Cardiovascular;  Laterality: N/A;   RIGHT/LEFT HEART CATH AND CORONARY ANGIOGRAPHY N/A 08/23/2017   Procedure: RIGHT/LEFT HEART CATH AND CORONARY ANGIOGRAPHY;  Surgeon: Lucendia Rusk, MD;  Location: [facility] INVASIVE CV LAB;  Service: Cardiovascular;  Laterality: N/A;   TEE WITHOUT CARDIOVERSION N/A 12/05/2017   Procedure: TRANSESOPHAGEAL ECHOCARDIOGRAM (TEE);  Surgeon: Bartley Lightning, MD;  Location: [facility] OR;  Service: Open Heart Surgery;  Laterality: N/A;   TONSILLECTOMY  1975   TRANSTHORACIC ECHOCARDIOGRAM  06-04-2012  dr Jacquelynn Matter   mild LVH,  grade I diastolic dysfunction/  ef 60-65%/  moderate  LAE/  mild MV calcifation without stenosis,  mild MR/  moderate AV stenosis,  cannot r/o bicupsid, area 1.1cm2/  mild dilated aortic root/  trivial TR   HPI:  Whitney [name] is a 68 yo female presenting to ED 4/20 with AMS. MRI Brain shows small acute posterior R frontal white matter infarct. Seen by SLP 04/14/22 with WFL oropharyngeal swallow and cognition. PMH includes Sjogren's syndrome, T2DM, fibromyalgia, depression, GERD, history of aortic valve replacement, A-fib on Eliquis , TIA, NASH cirrhosis, pulmonary HTN, 4L [place] at baseline   Assessment / Plan / Recommendation Clinical Impression  Pt presents with seemingly mild expressive and receptive language deficits characterized by semantic paraphasias and perseveration. Her most notable errors were in spontaneous speech and confrontation naming, but she also had increased difficulty with yes/no questions (80% accurate overall, 50% accurate with increased complexity). MD present in the room upon SLP arrival asking orientation questions. Pt made frequent verbal errors when stating her name, location, and date of birth and did not consistently benefit from phonemic cueing or multiple choices. she followed all single step commands given increased wait time or  repetition. Recommend ongoing SLP f/u acutely and after discharge. Will continue following.    SLP Assessment  SLP Recommendation/Assessment: Patient needs continued Speech Lanaguage Pathology Services SLP Visit Diagnosis: Aphasia (R47.01);Cognitive communication deficit (R41.841)    Recommendations for follow up therapy are one component of a multi-disciplinary discharge planning process, led by the attending physician.  Recommendations may be updated based on patient status, additional functional criteria and insurance authorization.    Follow Up Recommendations  Outpatient SLP    Assistance Recommended at Discharge  Intermittent Supervision/Assistance  Functional Status Assessment Patient has  had a recent decline in their functional status and demonstrates the ability to make significant improvements in function in a reasonable and predictable amount of time.  Frequency and Duration min 2x/week  2 weeks      SLP Evaluation Cognition  Overall Cognitive Status: Difficult to assess Orientation Level: Disoriented to time       Comprehension  Auditory Comprehension Overall Auditory Comprehension: Impaired Yes/No Questions: Impaired Basic Biographical Questions: 76-100% accurate Basic Immediate Environment Questions: 75-100% accurate Complex Questions: 25-49% accurate Commands: Impaired One Step Basic Commands: 75-100% accurate (with extra time or Min cueing)    Expression Expression Primary Mode of Expression: Verbal Verbal Expression Overall Verbal Expression: Impaired Initiation: No impairment Automatic Speech: Name;Social Response Repetition: No impairment Naming: Impairment Confrontation: Impaired Verbal Errors: Perseveration;Semantic paraphasias   Oral / Motor  Oral Motor/Sensory Function Overall Oral Motor/Sensory Function: Within functional limits Motor Speech Overall Motor Speech: Appears within functional limits for tasks assessed            Amil Kale, M.A., CCC-SLP Speech Language Pathology, Acute Rehabilitation Services  Secure Chat preferred [phone]  08/27/2023, 11:30 AM

## 2023-08-27 NOTE — Progress Notes (Signed)
 Speech Language Pathology Treatment: Dysphagia  Patient Details Name: MARIAPAULA KRIST MRN: [id number] DOB: 1956-03-01 Today's Date: 08/27/2023 Time: 1012-1022 SLP Time Calculation (min) (ACUTE ONLY): 10 min  Assessment / Plan / Recommendation Clinical Impression  Pt is now demonstrating a level of alertness appropriate for PO trials. She fed herself trials of purees and solids without s/s of dysphagia. Completed a 3 oz water  test with no signs clinically concerning for aspiration. Recommend a regular diet with thin liquids. No SLP f/u is clinically indicated for swallowing. Will sign off at this time.    HPI HPI: NATEISHA MOYD is a 68 yo female presenting to ED 4/20 with AMS. MRI Brain shows small acute posterior R frontal white matter infarct. Seen by SLP 04/14/22 with WFL oropharyngeal swallow and cognition. PMH includes Sjogren's syndrome, T2DM, fibromyalgia, depression, GERD, history of aortic valve replacement, A-fib on Eliquis , TIA, NASH cirrhosis, pulmonary HTN, 4L [place] at baseline      SLP Plan  All goals met      Recommendations for follow up therapy are one component of a multi-disciplinary discharge planning process, led by the attending physician.  Recommendations may be updated based on patient status, additional functional criteria and insurance authorization.    Recommendations  Diet recommendations: Regular;Thin liquid Liquids provided via: Cup;Straw Medication Administration: Whole meds with liquid Supervision: Patient able to self feed Compensations: Slow rate;Small sips/bites Postural Changes and/or Swallow Maneuvers: Seated upright 90 degrees                  Oral care BID   None Dysphagia, unspecified (R13.10)     All goals met     Amil Kale, M.A., CCC-SLP Speech Language Pathology, Acute Rehabilitation Services  Secure Chat preferred [phone]   08/27/2023, 11:14 AM

## 2023-08-27 NOTE — Progress Notes (Signed)
  Echocardiogram 2D Echocardiogram has been performed.  Khristin Keleher L Naija Troost RDCS 08/27/2023, 10:01 AM

## 2023-08-27 NOTE — Procedures (Addendum)
 Patient Name: Whitney [name]  MRN: [id number]  Epilepsy Attending: Arleene Lack  Referring Physician/Provider: Eleni Griffin, MD  Duration: 08/27/2023 9563 to 08/28/2023 0353  Patient history: 69 y.o. female with a history of Sjogren syndrome, diabetes, fibromyalgia, depression, GERD, aortic valve replacement, A-fib on Eliquis , TIA, NASH cirrhosis, pulmonary hypertension, thrombocytopenia and home O2 use and recent dog bite who presented with altered mental status.  EEG to evaluate for seizure  Level of alertness: Awake, asleep  AEDs during EEG study: None  Technical aspects: This EEG study was done with scalp electrodes positioned according to the 10-20 International system of electrode placement. Electrical activity was reviewed with band pass filter of 1-70Hz , sensitivity of 7 uV/mm, display speed of 45mm/sec with a 60Hz  notched filter applied as appropriate. EEG data were recorded continuously and digitally stored.  Video monitoring was available and reviewed as appropriate.  Description: No clear posterior dominant rhythm was seen. Sleep was characterized by vertex waves, maximal frontocentral region. EEG showed continuous generalized predominantly 5 to 7 Hz theta slowing admixed with intermittent 2-3hz  delta slowing. Hyperventilation and photic stimulation were not performed.      ABNORMALITY - Continuous slow, generalized   IMPRESSION: This study is suggestive of moderate diffuse encephalopathy. No seizures or epileptiform discharges were seen throughout the recording.   Makaylia Hewett O Tonnya Garbett

## 2023-08-27 NOTE — Evaluation (Signed)
 Occupational Therapy Evaluation Patient Details Name: Whitney [name] MRN: [id number] DOB: Jul 02, 1955 Today's Date: 08/27/2023   History of Present Illness    Whitney [name] is a 68 y.o. female with medical history significant for paroxysmal atrial fibrillation currently anticoagulated on Eliquis , NASH cirrhosis, type 2 diabetes mellitus companied by diabetic peripheral polyneuropathy, hyperlipidemia, aortic stenosis status post aortic valve replacement in August 2019, Sosan syndrome, moderate persistent asthma, thrombocytopenia with baseline platelet count 40s to 90s, who is admitted to [facility] on 08/25/2023 with acute encephalopathy after presenting from home to [facility] ED for evaluation of altered mental status.      Clinical Impressions Pt presents with decline in function and safety with ADLs and ADL mobility with impaired strength, balance, endurance and cognition. PTA pt lives with her son, manages her own meds, cooks, home mgt;very independent, caretaking for her son at times driving him to doctors appointments. Pt reports typically ambulating without DME, does utilize rollator PRN. Pt currently requires min A with LB ADLs, CGA sit - stand/mobility with person HHA. Pt's son states that pt had no cognitive impairments at baseline. Pt currenlty demos mild expressive and receptive deficits and perseveration. Pt made frequent verbal errors when stating the year (2026 and 2016) as well and difficulty with task initation, task continuation, attention and problem solving during bed mobility. Pt would benefit from acute OT services to address impairments to maximize level of function and safety   If plan is discharge home, recommend the following:   A lot of help with bathing/dressing/bathroom;A little help with walking and/or transfers;Assistance with cooking/housework;Assist for transportation;Direct supervision/assist for medications management;Help with stairs or ramp for entrance;Direct  supervision/assist for financial management;Supervision due to cognitive status     Functional Status Assessment   Patient has had a recent decline in their functional status and demonstrates the ability to make significant improvements in function in a reasonable and predictable amount of time.     Equipment Recommendations   None recommended by OT     Recommendations for Other Services         Precautions/Restrictions   Precautions Precautions: Fall;Other (comment) (EEG) Recall of Precautions/Restrictions: Impaired Restrictions Weight Bearing Restrictions Per Provider Order: No     Mobility Bed Mobility Overal bed mobility: Needs Assistance Bed Mobility: Supine to Sit, Sit to Supine     Supine to sit: Supervision, HOB elevated, Used rails Sit to supine: Supervision   General bed mobility comments: min verbal cues for task initiation and continuation to complete sitting EOB and to initite LEs mgt back onto bed    Transfers Overall transfer level: Needs assistance Equipment used: 2 person hand held assist Transfers: Sit to/from Stand Sit to Stand: Contact guard assist                  Balance Overall balance assessment: Needs assistance Sitting-balance support: No upper extremity supported, Feet supported Sitting balance-Leahy Scale: Good     Standing balance support: Bilateral upper extremity supported, During functional activity Standing balance-Leahy Scale: Poor                             ADL either performed or assessed with clinical judgement   ADL Overall ADL's : Needs assistance/impaired Eating/Feeding: Independent   Grooming: Wash/dry hands;Wash/dry face;Minimal assistance;Standing   Upper Body Bathing: Supervision/ safety;Sitting   Lower Body Bathing: Minimal assistance;Sit to/from stand   Upper Body Dressing : Supervision/safety;Sitting   Lower  Body Dressing: Minimal assistance;Sit to/from stand   Toilet Transfer:  Contact guard assist;Cueing for safety;Cueing for sequencing   Toileting- Clothing Manipulation and Hygiene: Minimal assistance;Sit to/from stand       Functional mobility during ADLs: Contact guard assist General ADL Comments: min verbal cues to initiate tasks as well as for task continuation; difficulty problem solving and pt easily distracted and performs better with 1-2 word commands     Vision Baseline Vision/History: 1 Wears glasses Ability to See in Adequate Light: 0 Adequate Patient Visual Report: No change from baseline       Perception         Praxis         Pertinent Vitals/Pain Pain Assessment Pain Assessment: No/denies pain Pain Intervention(s): Monitored during session, Repositioned     Extremity/Trunk Assessment Upper Extremity Assessment Upper Extremity Assessment: Overall WFL for tasks assessed   Lower Extremity Assessment Lower Extremity Assessment: Defer to PT evaluation       Communication Communication Communication: No apparent difficulties   Cognition Arousal: Alert   Cognition: Cognition impaired   Orientation impairments: Situation, Time         OT - Cognition Comments: pt's son states that pt had no cognitive impairments at baseline. Pt currenlty demos mild expressive and receptive deficits and perseveration. Pt made frequent verbal errors when stating the year (2026 and 2016) as well and difficulty with task initation, task continuation, attention and  problem solving during bed mobility                 Following commands: Intact       Cueing  General Comments   Cueing Techniques: Verbal cues      Exercises     Shoulder Instructions      Home Living Family/patient expects to be discharged to:: Private residence Living Arrangements: Children Available Help at Discharge: Family;Available 24 hours/day Type of Home: House Home Access: Stairs to enter [employer] of Steps: 2 steps + 2 steps Entrance  Stairs-Rails: None Home Layout: One level;Laundry or work area in [employer] Shower/Tub: Tub/shower unit;Door   [employer] Toilet: Standard Bathroom Accessibility: Yes   Home Equipment: Grab bars - toilet;Grab bars - tub/shower;Educational psychologist (4 wheels)      Lives With: Son    Prior Functioning/Environment Prior Level of Function : Driving;Independent/Modified Independent             Mobility Comments: pt reports typically ambulating without DME, does utilize rollator PRN ADLs Comments: Manages her own meds, cooks, home mgt;very independent. Caretaking for her son at times driving him to doctors appointments    OT Problem List: Decreased strength;Impaired balance (sitting and/or standing);Decreased cognition;Decreased knowledge of precautions;Decreased safety awareness;Decreased activity tolerance;Decreased coordination;Decreased knowledge of use of DME or AE   OT Treatment/Interventions: Self-care/ADL training;DME and/or AE instruction;Therapeutic activities;Balance training;Patient/family education      OT Goals(Current goals can be found in the care plan section)   Acute Rehab OT Goals Patient Stated Goal: go home OT Goal Formulation: With patient/family Time For Goal Achievement: 09/10/23 Potential to Achieve Goals: Good ADL Goals Pt Will Perform Grooming: with supervision;with set-up;standing Pt Will Perform Upper Body Bathing: with set-up;with modified independence Pt Will Perform Lower Body Bathing: with contact guard assist;with supervision Pt Will Perform Upper Body Dressing: with set-up;with modified independence Pt Will Perform Lower Body Dressing: with contact guard assist;with supervision Pt Will Transfer to Toilet: with contact guard assist;with supervision;ambulating Pt Will Perform Toileting - Clothing  Manipulation and hygiene: with supervision;with modified independence Pt Will Perform Tub/Shower Transfer: with contact guard assist;with  supervision;ambulating;rolling walker;shower seat;grab bars   OT Frequency:  Min 2X/week    Co-evaluation PT/OT/SLP Co-Evaluation/Treatment: Yes Reason for Co-Treatment: For patient/therapist safety;To address functional/ADL transfers   OT goals addressed during session: ADL's and self-care      AM-PAC OT "6 Clicks" Daily Activity     Outcome Measure Help from another person eating meals?: None Help from another person taking care of personal grooming?: A Little Help from another person toileting, which includes using toliet, bedpan, or urinal?: A Little Help from another person bathing (including washing, rinsing, drying)?: A Little Help from another person to put on and taking off regular upper body clothing?: A Little Help from another person to put on and taking off regular lower body clothing?: A Little 6 Click Score: 19   End of Session Equipment Utilized During Treatment: Gait belt Nurse Communication: Mobility status  Activity Tolerance: Patient tolerated treatment well Patient left: in bed;with call bell/phone within reach;with bed alarm set;with family/visitor present  OT Visit Diagnosis: Unsteadiness on feet (R26.81);Other abnormalities of gait and mobility (R26.89);Other symptoms and signs involving cognitive function                Time: 9147-8295 OT Time Calculation (min): 34 min Charges:  OT General Charges $OT Visit: 1 Visit OT Evaluation $OT Eval Moderate Complexity: 1 Mod    Whitney [name] 08/27/2023, 2:34 PM

## 2023-08-27 NOTE — Progress Notes (Addendum)
 PROGRESS NOTE                                                                                                                                                                                                             Patient Demographics:    Whitney [name], is a 68 y.o. female, DOB - [birth year], WUJ:[id number]  Outpatient Primary MD for the patient is Jimmey Mould, MD    LOS - 2  Admit date - 08/25/2023    Chief Complaint  Patient presents with   Altered Mental Status   Code Stroke       Brief Narrative (HPI from H&P)   Whitney [name] is a 68 y.o. female with medical history significant for paroxysmal atrial fibrillation currently anticoagulated on Eliquis , NASH cirrhosis, type 2 diabetes mellitus companied by diabetic peripheral polyneuropathy, hyperlipidemia, aortic stenosis status post aortic valve replacement in August 2019, Sosan syndrome, moderate persistent asthma, thrombocytopenia with baseline platelet count 40s to 90s, who is admitted to [facility] on 08/25/2023 with acute encephalopathy after presenting from home to [facility] ED for evaluation of altered mental status.    The patient was bit by her dog on the left forearm on 08/20/2023, at which time she presented to the vascular emergency department for further evaluation and management thereof.  At that time, she was discharged to home on Augmentin  as well as prn Percocet.  He is making good compliance in the interval with her Augmentin  prn use of aforementioned Percocet.   Subsequently, over the last 2 days, family has noted the patient to be used to the baseline, somnolent, with generalized weakness in the absence of any acute focal weakness, the patient was intermittently slurring her speech.  He has not noted any tonic-clonic activity during her recent fall/trauma.   The patient was brought to the emergency department today, 08/24/2023 for further evaluation  management of the above, at which time workup included CT head which showed no evidence of acute intracranial process.  Urinalysis was obtained at that time and demonstrated 21-50 white blood cells, but was felt to be contaminated given concomitant presence of 11-20 squamous epithelial cells.  She was subsequent discharged to home.  However, in the absence of any significant interval improvement in her mental status, she was brought back to the emergency department this evening for further  evaluation management thereof.   Her medical history is notable for paroxysmal atrial fibrillation which is chronically anticoagulated on Eliquis . She also has a history of Florentina Huntsman cirrhosis, with most recent prior ammonia level 21 in November 2024.  Not on lactulose  as an outpatient.  This is complicated by chronic thrombocytopenia with baseline platelet counts 40s to 90s.  She has a history of serotonin syndrome for she has been prescribed hydroxychloroquine , but has not yet started on this medication.   Family conveys that the patient has no history of alcohol abuse nor any recreational drug use.  CTH was negative. EDP discussed patient's case with on-call neurology, Dr. Cleone Dad, who conveyed that neurology will formally consult.  Neurology recommends MRI brain and also anticipates pursuing EEG tomorrow when this service is available (4/21). TRH was asked to see.      Subjective:    Whitney [name] was evaluated at the bed side. Evaluated with son at bedside. Patient is awake, laying in bed conserving with son. She is able to tell me her name, her DOB, the month, current president and current location with some cueing but says it is 2055. She has no acute complaints.   Assessment  & Plan :    Assessment and Plan:  # Acute encephalopathy, improved # Acute stroke, likely embolic # ?Meningitis - Presented with progressive decline in mental status over the last week - Family also reports intermittent low-grade fevers  at home - Mental status significantly improved today, seem close to baseline - MRI brain showed small acute posterior right frontal white matter infarct. - EEG shows mild to moderate diffuse encephalopathy but no seizures or epileptiform discharges, currently on continuous EEG - No metabolic or electrolyte derangement to explain her presentation, pC02 wnl  - Neurology following, initiated empirical meningitis coverage on 4/21 - Continue vancomycin , Rocephin , ampicillin  and acyclovir , will defer to neuro on end date. - Blood cultures NGTD, UDS, urinalysis, paraneoplastic labs pending - Pending TTE with bubble study - Trend CBC, fever curve - Frequent neurochecks - PT/OT/SLP recommending Home Health PT/OT/SLP - Delirium, fall and aspiration precaution - Avoid centrally acting agents  # Recent dog bite - Per family, patient was bit by dog on 5/15 and currently on Augmentin  and as needed Percocet.   - Site of dog bite healing appropriately without any evidence of cellulitis or abscess - Continue antibiotics as above  # Paroxysmal A-fib - Initially presented in normal sinus but reverted back in A-fib yesterday morning - Currently in NSR with HR in the 70s-80s.  - Continue nadolol  - PRN IV metoprolol  with hold parameters - Telemetry  # NASH cirrhosis # Thrombocytopenia - MELD score of 13.   - LFTs normal, Platelet of 99, improved - Continue nadalol - Trend CBC, CMP  # T2DM # Diabetic neuropathy - A1c of 5.1%, CBG in the 100-130s - SSI, CBG monitoring - Continue to hold gabapentin  for now  # HLD - Continue Zetia  and rosuvastatin  - LDL 74  # Moderate persistent asthma - Continue home bronchodilators and as needed albuterol  nebs  # OSA - Continue CPAP at night  # Sjogren syndrome - Resume plaquenil   # Pulmonary HTN - Resume tadalafil   # Obesity: Estimated body mass index is 43.21 kg/m as calculated from the following:   Height as of this encounter: 5' 3.75" (1.619  m).   Weight as of this encounter: 113.3 kg.          Condition -Improving  Family Communication  : Discussed  plan with son at bedside  Code Status : Full  Consults  : Neurology  PUD Prophylaxis : None   Procedures  :     None      Disposition Plan  :    Status is: Inpatient Remains inpatient appropriate because: Acute encephalopathy, acute stroke  DVT Prophylaxis  :    SCDs Start: 08/25/23 1931 apixaban  (ELIQUIS ) tablet 5 mg     Lab Results  Component Value Date   PLT 99 (L) 08/27/2023    Diet :  Diet Order             Diet regular Room service appropriate? Yes with Assist; Fluid consistency: Thin  Diet effective now                    Inpatient Medications  Scheduled Meds:  apixaban   5 mg Oral BID   arformoterol   15 mcg Nebulization BID   And   umeclidinium bromide   1 puff Inhalation Daily   ezetimibe   10 mg Oral QHS   hydroxychloroquine   200 mg Oral Daily   [START ON 08/28/2023] insulin  aspart  0-6 Units Subcutaneous TID WC   lidocaine   1 patch Transdermal Q24H   nadolol   40 mg Oral Daily   rosuvastatin   10 mg Oral Daily   tadalafil   20 mg Oral BID   Continuous Infusions:  acyclovir  700 mg (08/27/23 1300)   ampicillin  (OMNIPEN) IV 2 g (08/27/23 1701)   cefTRIAXone  (ROCEPHIN )  IV 2 g (08/27/23 0806)   vancomycin  750 mg (08/27/23 1036)   PRN Meds:.acetaminophen  **OR** acetaminophen , albuterol , fentaNYL  (SUBLIMAZE ) injection, melatonin, metoprolol  tartrate, naLOXone  (NARCAN )  injection, ondansetron  (ZOFRAN ) IV  Antibiotics  :    Anti-infectives (From admission, onward)    Start     Dose/Rate Route Frequency Ordered Stop   08/27/23 1415  hydroxychloroquine  (PLAQUENIL ) tablet 200 mg        200 mg Oral Daily 08/27/23 1317     08/27/23 0000  vancomycin  (VANCOREADY) IVPB 750 mg/150 mL       Placed in "Followed by" Linked Group   750 mg 150 mL/hr over 60 Minutes Intravenous Every 12 hours 08/26/23 1120     08/26/23 1200  ampicillin   (OMNIPEN) 2 g in sodium chloride  0.9 % 100 mL IVPB        2 g 300 mL/hr over 20 Minutes Intravenous Every 4 hours 08/26/23 1034     08/26/23 1200  vancomycin  (VANCOREADY) IVPB 2000 mg/400 mL       Placed in "Followed by" Linked Group   2,000 mg 200 mL/hr over 120 Minutes Intravenous  Once 08/26/23 1120 08/26/23 1449   08/26/23 1200  acyclovir  (ZOVIRAX ) 700 mg in dextrose  5 % 100 mL IVPB        700 mg 114 mL/hr over 60 Minutes Intravenous Every 8 hours 08/26/23 1125     08/26/23 1045  cefTRIAXone  (ROCEPHIN ) 2 g in sodium chloride  0.9 % 100 mL IVPB        2 g 200 mL/hr over 30 Minutes Intravenous Every 12 hours 08/26/23 1034     08/26/23 0800  amoxicillin -clavulanate (AUGMENTIN ) 875-125 MG per tablet 1 tablet  Status:  Discontinued        1 tablet Oral Every 12 hours 08/25/23 2316 08/26/23 1118         Objective:   Vitals:   08/27/23 0829 08/27/23 1240 08/27/23 1607 08/27/23 1943  BP:  109/86 111/71 106/66  Pulse:  71  73 77  Resp:  19 19 18   Temp:  (!) 97.5 F (36.4 C) 98.1 F (36.7 C) (!) 97.4 F (36.3 C)  TempSrc:  Oral Oral Oral  SpO2: 98% 99% 99% 100%  Weight:      Height:        Wt Readings from Last 3 Encounters:  08/27/23 113.3 kg  08/01/23 100.8 kg  07/12/23 99.3 kg     Intake/Output Summary (Last 24 hours) at 08/27/2023 1955 Last data filed at 08/27/2023 1701 Gross per 24 hour  Intake 2198.28 ml  Output 700 ml  Net 1498.28 ml     Physical Exam  General: Well-appearing elderly woman laying in bed. No acute distress. HEENT: [place]/AT. Anicteric sclera. Dry lips. CV: Regular rate. Regular rhythm. II/VI systolic murmur. No LE edema Pulmonary: Lungs CTAB. Normal effort. No wheezing or rales. Extremities: Palpable radial and DP pulses. Normal ROM. Skin: Warm and dry. Left arm dog bite site healing appropriately Neuro: Alert and oriented x3. Moving all extremities. Normal grip strength. Normal sensation to light touch.    RN pressure injury documentation:       Data Review:    Recent Labs  Lab 08/24/23 1154 08/24/23 1415 08/25/23 1735 08/25/23 1736 08/25/23 2132 08/26/23 0406 08/27/23 0618  WBC 4.6  --  5.5  --   --  4.9 5.5  HGB 11.8*   < > 11.6* 12.2 13.6 12.5 12.4  HCT 37.5   < > 37.8 36.0 40.0 40.4 40.2  PLT 90*  --  92*  --   --  80* 99*  MCV 96.4  --  100.0  --   --  99.5 97.8  MCH 30.3  --  30.7  --   --  30.8 30.2  MCHC 31.5  --  30.7  --   --  30.9 30.8  RDW 14.7  --  15.0  --   --  14.8 15.2  LYMPHSABS 1.4  --  1.5  --   --  1.4  --   MONOABS 0.6  --  0.9  --   --  0.7  --   EOSABS 0.1  --  0.1  --   --  0.1  --   BASOSABS 0.0  --  0.0  --   --  0.0  --    < > = values in this interval not displayed.    Recent Labs  Lab 08/24/23 1154 08/24/23 1337 08/24/23 1415 08/25/23 1735 08/25/23 1736 08/25/23 2125 08/25/23 2132 08/26/23 0406 08/27/23 0618  NA 137  --    < > 139 140  --  140 141 141  K 4.6  --    < > 4.9 4.8  --  4.8 5.1 4.6  CL 102  --   --  103 101  --   --  103 108  CO2 29  --   --  30  --   --   --  31 25  ANIONGAP 6  --   --  6  --   --   --  7 8  GLUCOSE 138*  --   --  143* 136*  --   --  95 115*  BUN 32*  --   --  23 29*  --   --  22 28*  CREATININE 1.03*  --   --  0.89 0.90  --   --  0.86 0.96  AST 25  --   --  21  --   --   --  19 18  ALT 7  --   --  7  --   --   --  7 7  ALKPHOS 46  --   --  41  --   --   --  45 41  BILITOT 1.2  --   --  1.1  --   --   --  1.3* 1.2  ALBUMIN  2.8*  --   --  2.7*  --   --   --  3.0* 2.7*  LATICACIDVEN  --   --   --   --  0.6  --   --   --   --   INR  --   --   --  1.8*  --   --   --  1.5*  --   TSH  --   --   --   --   --   --   --  1.350  --   HGBA1C  --   --   --   --   --   --   --  5.1  --   AMMONIA  --  29  --   --   --  23  --  29  --   MG  --   --   --   --   --  2.2  --  2.4  --   CALCIUM  8.6*  --   --  8.6*  --   --   --  9.0 8.7*   < > = values in this interval not displayed.      Recent Labs  Lab 08/24/23 1154 08/24/23 1337 08/25/23 1735  08/25/23 1736 08/25/23 2125 08/26/23 0406 08/27/23 0618  LATICACIDVEN  --   --   --  0.6  --   --   --   INR  --   --  1.8*  --   --  1.5*  --   TSH  --   --   --   --   --  1.350  --   HGBA1C  --   --   --   --   --  5.1  --   AMMONIA  --  29  --   --  23 29  --   MG  --   --   --   --  2.2 2.4  --   CALCIUM  8.6*  --  8.6*  --   --  9.0 8.7*    --------------------------------------------------------------------------------------------------------------- Lab Results  Component Value Date   CHOL 124 08/27/2023   HDL 29 (L) 08/27/2023   LDLCALC 74 08/27/2023   TRIG 106 08/27/2023   CHOLHDL 4.3 08/27/2023    Lab Results  Component Value Date   HGBA1C 5.1 08/26/2023   Recent Labs    08/26/23 0406  TSH 1.350   Recent Labs    08/26/23 0438  VITAMINB12 681   ------------------------------------------------------------------------------------------------------------------ Cardiac Enzymes No results for input(s): "CKMB", "TROPONINI", "MYOGLOBIN" in the last 168 hours.  Invalid input(s): "CK"  Micro Results Recent Results (from the past 240 hours)  Resp panel by RT-PCR (RSV, Flu A&B, Covid) Anterior Nasal Swab     Status: None   Collection Time: 08/25/23  6:23 PM   Specimen: Anterior Nasal Swab  Result Value Ref Range Status   SARS Coronavirus 2 by RT PCR NEGATIVE NEGATIVE Final   Influenza A by PCR NEGATIVE NEGATIVE Final   Influenza B by PCR NEGATIVE NEGATIVE  Final    Comment: (NOTE) The Xpert Xpress SARS-CoV-2/FLU/RSV plus assay is intended as an aid in the diagnosis of influenza from Nasopharyngeal swab specimens and should not be used as a sole basis for treatment. Nasal washings and aspirates are unacceptable for Xpert Xpress SARS-CoV-2/FLU/RSV testing.  Fact Sheet for Patients: BloggerCourse.com  Fact Sheet for Healthcare Providers: SeriousBroker.it  This test is not yet approved or cleared by the [place] FDA and has been authorized for detection and/or diagnosis of SARS-CoV-2 by FDA under an Emergency Use Authorization (EUA). This EUA will remain in effect (meaning this test can be used) for the duration of the COVID-19 declaration under Section 564(b)(1) of the Act, 21 U.S.C. section 360bbb-3(b)(1), unless the authorization is terminated or revoked.     Resp Syncytial Virus by PCR NEGATIVE NEGATIVE Final    Comment: (NOTE) Fact Sheet for Patients: BloggerCourse.com  Fact Sheet for Healthcare Providers: SeriousBroker.it  This test is not yet approved or cleared by the [place]  FDA and has been authorized for detection and/or diagnosis of SARS-CoV-2 by FDA under an Emergency Use Authorization (EUA). This EUA will remain in effect (meaning this test can be used) for the duration of the COVID-19 declaration under Section 564(b)(1) of the Act, 21 U.S.C. section 360bbb-3(b)(1), unless the authorization is terminated or revoked.  Performed at [facility], [address] [place], [place] [address]   Culture, blood (Routine X 2) w Reflex to ID Panel     Status: None (Preliminary result)   Collection Time: 08/27/23  6:18 AM   Specimen: BLOOD  Result Value Ref Range Status   Specimen Description BLOOD BLOOD LEFT ARM  Final   Special Requests   Final    BOTTLES DRAWN AEROBIC AND ANAEROBIC Blood Culture adequate volume   Culture   Final    NO GROWTH < 12 HOURS Performed at [facility], [address], [place], [place] [address]    Report Status PENDING  Incomplete  Culture, blood (Routine X 2) w Reflex to ID Panel     Status: None (Preliminary result)   Collection Time: 08/27/23  6:19 AM   Specimen: BLOOD  Result Value Ref Range Status   Specimen Description BLOOD BLOOD RIGHT HAND  Final   Special Requests   Final    BOTTLES DRAWN AEROBIC ONLY Blood Culture results may not be optimal due to an inadequate  volume of blood received in culture bottles   Culture   Final    NO GROWTH < 12 HOURS Performed at [facility], [address] [place], [place] [address]    Report Status PENDING  Incomplete    Radiology Reports Overnight EEG with video Result Date: 08/27/2023 Arleene Lack, MD     08/27/2023  9:20 AM Patient Name: Whitney [name] MRN: [id number] Epilepsy Attending: Arleene Lack Referring Physician/Provider: Eleni Griffin, MD Duration: 08/27/2023 0353 to 08/27/2023 0915 Patient history: 68 y.o. female with a history of Sjogren syndrome, diabetes, fibromyalgia, depression, GERD, aortic valve replacement, A-fib on Eliquis , TIA, NASH cirrhosis, pulmonary hypertension, thrombocytopenia and home O2 use and recent dog bite who presented with altered mental status.  EEG to evaluate for seizure Level of alertness: Awake, asleep AEDs during EEG study: None Technical aspects: This EEG study was done with scalp electrodes positioned according to the 10-20 International system of electrode placement. Electrical activity was reviewed with band pass filter of 1-70Hz , sensitivity of 7 uV/mm, display speed of  45mm/sec with a 60Hz  notched filter applied as appropriate. EEG data were recorded continuously and digitally stored.  Video monitoring was available and reviewed as appropriate. Description: No clear posterior dominant rhythm was seen. Sleep was characterized by vertex waves, maximal frontocentral region. EEG showed continuous generalized predominantly 5 to 7 Hz theta slowing admixed with intermittent 2-3hz  delta slowing. Hyperventilation and photic stimulation were not performed.   ABNORMALITY - Continuous slow, generalized IMPRESSION: This study is suggestive of moderate diffuse encephalopathy. No seizures or epileptiform discharges were seen throughout the recording. Arleene Lack   EEG adult Result Date: 08/26/2023 Arleene Lack, MD     08/26/2023 12:30 PM Patient Name: Whitney [name] MRN:  [id number] Epilepsy Attending: Arleene Lack Referring Physician/Provider: Colon Dear, NP Date: 08/26/2023 Duration: 23.39 mins Patient history: 68yo F with ams. EEG to evaluate for seizure Level of alertness: Awake, asleep AEDs during EEG study: None Technical aspects: This EEG study was done with scalp electrodes positioned according to the 10-20 International system of electrode placement. Electrical activity was reviewed with band pass filter of 1-70Hz , sensitivity of 7 uV/mm, display speed of 88mm/sec with a 60Hz  notched filter applied as appropriate. EEG data were recorded continuously and digitally stored.  Video monitoring was available and reviewed as appropriate. Description: The posterior dominant rhythm consists of 7 Hz activity of moderate voltage (25-35 uV) seen predominantly in posterior head regions, symmetric and reactive to eye opening and eye closing. Sleep was characterized by sleep spindles (12 to 14 Hz), maximal frontocentral region. EEG showed continuous generalized 6 to 7 Hz theta slowing. Hyperventilation and photic stimulation were not performed.   ABNORMALITY - Continuous slow, generalized IMPRESSION: This study is suggestive of mild to moderate diffuse encephalopathy. No seizures or epileptiform discharges were seen throughout the recording. Whitney [name]   CT ANGIO HEAD NECK W WO CM Result Date: 08/26/2023 CLINICAL DATA:  68 year old female with code stroke presentation. Small right superior frontal subcortical white matter infarct on MRI this morning. EXAM: CT ANGIOGRAPHY HEAD AND NECK WITH AND WITHOUT CONTRAST TECHNIQUE: Multidetector CT imaging of the head and neck was performed using the standard protocol during bolus administration of intravenous contrast. Multiplanar CT image reconstructions and MIPs were obtained to evaluate the vascular anatomy. Carotid stenosis measurements (when applicable) are obtained utilizing NASCET criteria, using the distal internal  carotid diameter as the denominator. RADIATION DOSE REDUCTION: This exam was performed according to the departmental dose-optimization program which includes automated exposure control, adjustment of the mA and/or kV according to patient size and/or use of iterative reconstruction technique. CONTRAST:  75mL OMNIPAQUE  IOHEXOL  350 MG/ML SOLN COMPARISON:  Brain MRI today reported separately. CTA head and neck 04/14/2022. FINDINGS: CTA NECK Skeleton: Sternotomy.  No acute osseous abnormality identified. Upper chest: CABG.  Upper lung atelectasis. Other neck: Retropharyngeal course of the carotids. Redemonstrated asymmetrically atrophied, heterogeneously calcified parotid glands and submandibular glands. Other nonvascular neck soft tissue spaces are within normal limits. Aortic arch: Calcified aortic atherosclerosis. 3 vessel arch configuration. Right carotid system: Tortuous brachiocephalic artery and right CCA with no significant plaque or stenosis. Mild calcified plaque at the right ICA origin. Tortuous right ICA without stenosis. Left carotid system: Similar tortuosity and mild calcified plaque without stenosis. Vertebral arteries: Tortuous proximal right subclavian origin with minimal plaque. Normal right vertebral artery origin. Mildly tortuous and non dominant right vertebral artery is patent to the skull base with no significant plaque or stenosis. Calcified proximal left subclavian artery plaque  without stenosis. Normal left vertebral artery origin. Tortuous left V1 segment. Left vertebral artery is mildly dominant and tortuous to the skull base with no significant plaque or stenosis. CTA HEAD Posterior circulation: Distal vertebral arteries, vertebrobasilar junction, left PICA origin and dominant appearing right AICA are patent with no significant plaque or stenosis. Patent basilar artery without stenosis. Patent SCA origins. Fetal type bilateral PCA origins again noted. Bilateral PCA branches appear stable and  within normal limits. Anterior circulation: Both ICA siphons are patent. Mild siphon tortuosity. Mild left siphon calcified plaque without stenosis. Similar right siphon tortuosity and mild calcified plaque without stenosis. Normal posterior communicating artery origins. Patent carotid termini. Normal MCA and ACA origins. Anterior communicating artery and bilateral ACA branches are within normal limits. MCA M1 segments, bi/trifurcations are patent without stenosis. Bilateral MCA branches are stable and within normal limits. Venous sinuses: Patent. Anatomic variants: Fetal type PCA origins. Mildly dominant left vertebral artery. Review of the MIP images confirms the above findings IMPRESSION: 1. Negative large vessel occlusion. Generalized arterial tortuosity in the head and neck with mild for age atherosclerosis and no hemodynamically significant arterial stenosis. 2.  Aortic Atherosclerosis (ICD10-I70.0).  CABG. 3. Chronic salivary gland atrophy, sialolithiasis. Electronically Signed   By: Marlise Simpers M.D.   On: 08/26/2023 10:04   MR BRAIN WO CONTRAST Result Date: 08/26/2023 CLINICAL DATA:  Neuro deficit, acute, stroke suspected. Altered mental status. EXAM: MRI HEAD WITHOUT CONTRAST TECHNIQUE: Multiplanar, multiecho pulse sequences of the brain and surrounding structures were obtained without intravenous contrast. COMPARISON:  Head CT 08/25/2023 and MRI 03/17/2023 FINDINGS: The study is intermittently up to moderately motion degraded. Brain: There is a 1 cm acute infarct in the subcortical white matter of the posterior right frontal lobe. A few chronic cerebral microhemorrhages are nonspecific. Small T2 hyperintensities in the cerebral white matter nonspecific but compatible with mild chronic small vessel ischemic disease. There are small chronic infarcts involving the left caudate nucleus and cerebellum. A partially empty sella is unchanged. Cerebral volume is within normal limits for age. The ventricles are  normal in size. No mass, midline shift, or extra-axial fluid collection is identified. Vascular: Major intracranial vascular flow voids are preserved. Skull and upper cervical spine: Unremarkable bone marrow signal. Sinuses/Orbits: Unremarkable orbits. Mild mucosal thickening in the paranasal sinuses. Trace right mastoid fluid. Other: None. IMPRESSION: 1. Small acute posterior right frontal white matter infarct. 2. Mild chronic small vessel ischemic disease. Electronically Signed   By: Aundra Lee M.D.   On: 08/26/2023 07:26   DG Chest Portable 1 View Result Date: 08/25/2023 CLINICAL DATA:  Altered mental status. EXAM: PORTABLE CHEST 1 VIEW COMPARISON:  Radiograph 08/24/2023 FINDINGS: Stable cardiomegaly. Loop recorder. AVR. Sternotomy. Aortic atherosclerotic calcification. Pulmonary vascular congestion. Prominent central pulmonary arteries. No focal consolidation, pleural effusion, or pneumothorax. IMPRESSION: Cardiomegaly with pulmonary vascular congestion. Electronically Signed   By: Rozell Cornet M.D.   On: 08/25/2023 19:36   CT HEAD CODE STROKE WO CONTRAST Result Date: 08/25/2023 CLINICAL DATA:  Code stroke.  Dysphasia EXAM: CT HEAD WITHOUT CONTRAST TECHNIQUE: Contiguous axial images were obtained from the base of the skull through the vertex without intravenous contrast. RADIATION DOSE REDUCTION: This exam was performed according to the departmental dose-optimization program which includes automated exposure control, adjustment of the mA and/or kV according to patient size and/or use of iterative reconstruction technique. COMPARISON:  CT head without contrast 08/24/2023. MRI of the head without contrast 03/17/2023 FINDINGS: Brain: No acute infarct, hemorrhage, or mass lesion is  present. Mild periventricular white matter changes are stable. A remote lacunar infarct is again noted in the left caudate head. The deep brain nuclei are otherwise within normal limits. No acute or focal cortical abnormalities  are present. The ventricles are of normal size. No significant extraaxial fluid collection is present. An enlarged relatively empty sella is present. Midline structures are otherwise unremarkable. Vascular: Atherosclerotic calcifications are present within the cavernous internal carotid arteries bilaterally. No hyperdense vessel is present. Skull: Calvarium is intact. No focal lytic or blastic lesions are present. No significant extracranial soft tissue lesion is present. Sinuses/Orbits: The paranasal sinuses and mastoid air cells are clear. The globes and orbits are within normal limits. Other: Diffuse fatty infiltration of the parotid glands is again noted bilaterally. Multiple punctate calcifications are present in both parotid glands. ASPECTS [facility] Stroke Program Early CT Score) - Ganglionic level infarction (caudate, lentiform nuclei, internal capsule, insula, M1-M3 cortex): 7/7 - Supraganglionic infarction (M4-M6 cortex): 3/3 Total score (0-10 with 10 being normal): 10/10 IMPRESSION: 1. No acute intracranial abnormality or significant interval change. 2. Stable mild periventricular white matter disease. This likely reflects the sequela of chronic microvascular ischemia. 3. Remote lacunar infarct of the left caudate head. 4. Aspects is 10/10. 5. Diffuse fatty infiltration of the parotid glands bilaterally with multiple punctate calcifications. This suggests Sjogren disease The above was relayed via text pager to Dr. Cleone Dad on 08/25/2023 at 17:48 . Electronically Signed   By: Audree Leas M.D.   On: 08/25/2023 17:48   DG Chest 2 View Result Date: 08/24/2023 CLINICAL DATA:  Chest pain. EXAM: CHEST - 2 VIEW COMPARISON:  10/25/2022. FINDINGS: Heart is enlarged and the mediastinal contour is within normal limits. Left atrial appendage clip and prosthetic valve are noted. Lung volumes are low resulting in vascular crowding. No consolidation, effusion, or pneumothorax. Sternotomy wires are present over  the midline. Degenerative changes are seen in the thoracic spine. IMPRESSION: No active cardiopulmonary disease. Electronically Signed   By: Wyvonnia Heimlich M.D.   On: 08/24/2023 16:03   CT Head Wo Contrast Result Date: 08/24/2023 CLINICAL DATA:  Mental status change, unknown cause. EXAM: CT HEAD WITHOUT CONTRAST TECHNIQUE: Contiguous axial images were obtained from the base of the skull through the vertex without intravenous contrast. RADIATION DOSE REDUCTION: This exam was performed according to the departmental dose-optimization program which includes automated exposure control, adjustment of the mA and/or kV according to patient size and/or use of iterative reconstruction technique. COMPARISON:  03/17/2023. FINDINGS: Brain: No acute intracranial hemorrhage, midline shift or mass effect. No extra-axial fluid collection. Mild periventricular white matter hypodensities are present bilaterally. No hydrocephalus. Vascular: No hyperdense vessel or unexpected calcification. Skull: Normal. Negative for fracture or focal lesion. Sinuses/Orbits: There is partial opacification of the mastoid air cells bilaterally. Mucosal thickening is present in the maxillary sinuses. No acute orbital abnormality. Other: None. IMPRESSION: 1. No acute intracranial process. 2. Mild chronic microvascular ischemic changes. Electronically Signed   By: Wyvonnia Heimlich M.D.   On: 08/24/2023 15:59      Signature  -   Vita Grip M.D on 08/27/2023 at 7:55 PM   -  To page go to [email or web address]

## 2023-08-27 NOTE — Plan of Care (Signed)
 Problem: Education: Goal: Ability to describe self-care measures that may prevent or decrease complications (Diabetes Survival Skills Education) will improve 08/27/2023 0649 by Kathi Panning, RN Outcome: Progressing 08/27/2023 0233 by Kathi Panning, RN Outcome: Progressing Goal: Individualized Educational Video(s) 08/27/2023 0649 by Kathi Panning, RN Outcome: Progressing 08/27/2023 0233 by Kathi Panning, RN Outcome: Progressing   Problem: Coping: Goal: Ability to adjust to condition or change in health will improve 08/27/2023 0649 by Kathi Panning, RN Outcome: Progressing 08/27/2023 0233 by Kathi Panning, RN Outcome: Progressing   Problem: Fluid Volume: Goal: Ability to maintain a balanced intake and output will improve 08/27/2023 0649 by Kathi Panning, RN Outcome: Progressing 08/27/2023 0233 by Kathi Panning, RN Outcome: Progressing   Problem: Health Behavior/Discharge Planning: Goal: Ability to identify and utilize available resources and services will improve 08/27/2023 0649 by Kathi Panning, RN Outcome: Progressing 08/27/2023 0233 by Kathi Panning, RN Outcome: Progressing Goal: Ability to manage health-related needs will improve 08/27/2023 0649 by Kathi Panning, RN Outcome: Progressing 08/27/2023 0233 by Kathi Panning, RN Outcome: Progressing   Problem: Metabolic: Goal: Ability to maintain appropriate glucose levels will improve 08/27/2023 0649 by Kathi Panning, RN Outcome: Progressing 08/27/2023 0233 by Kathi Panning, RN Outcome: Progressing   Problem: Nutritional: Goal: Maintenance of adequate nutrition will improve 08/27/2023 0649 by Kathi Panning, RN Outcome: Progressing 08/27/2023 0233 by Kathi Panning, RN Outcome: Progressing Goal: Progress toward achieving an optimal weight will improve 08/27/2023 0649 by Kathi Panning, RN Outcome: Progressing 08/27/2023 0233 by Kathi Panning, RN Outcome: Progressing   Problem: Skin Integrity: Goal: Risk for  impaired skin integrity will decrease 08/27/2023 0649 by Kathi Panning, RN Outcome: Progressing 08/27/2023 0233 by Kathi Panning, RN Outcome: Progressing   Problem: Tissue Perfusion: Goal: Adequacy of tissue perfusion will improve 08/27/2023 0649 by Kathi Panning, RN Outcome: Progressing 08/27/2023 0233 by Kathi Panning, RN Outcome: Progressing   Problem: Education: Goal: Knowledge of disease or condition will improve 08/27/2023 0649 by Kathi Panning, RN Outcome: Progressing 08/27/2023 0233 by Kathi Panning, RN Outcome: Progressing Goal: Knowledge of secondary prevention will improve (MUST DOCUMENT ALL) 08/27/2023 0649 by Kathi Panning, RN Outcome: Progressing 08/27/2023 0233 by Kathi Panning, RN Outcome: Progressing Goal: Knowledge of patient specific risk factors will improve (DELETE if not current risk factor) 08/27/2023 0649 by Kathi Panning, RN Outcome: Progressing 08/27/2023 0233 by Kathi Panning, RN Outcome: Progressing   Problem: Ischemic Stroke/TIA Tissue Perfusion: Goal: Complications of ischemic stroke/TIA will be minimized 08/27/2023 0649 by Kathi Panning, RN Outcome: Progressing 08/27/2023 0233 by Kathi Panning, RN Outcome: Progressing   Problem: Coping: Goal: Will verbalize positive feelings about self 08/27/2023 0649 by Kathi Panning, RN Outcome: Progressing 08/27/2023 0233 by Kathi Panning, RN Outcome: Progressing Goal: Will identify appropriate support needs 08/27/2023 0649 by Kathi Panning, RN Outcome: Progressing 08/27/2023 0233 by Kathi Panning, RN Outcome: Progressing   Problem: Health Behavior/Discharge Planning: Goal: Ability to manage health-related needs will improve 08/27/2023 0649 by Kathi Panning, RN Outcome: Progressing 08/27/2023 0233 by Kathi Panning, RN Outcome: Progressing Goal: Goals will be collaboratively established with patient/family 08/27/2023 [phone] by Kathi Panning, RN Outcome: Progressing 08/27/2023 0233 by Kathi Panning, RN Outcome: Progressing   Problem: Self-Care: Goal: Ability to participate in self-care as condition permits will improve 08/27/2023 0649 by Kathi Panning, RN Outcome: Progressing 08/27/2023 0233 by Kathi Panning, RN Outcome: Progressing Goal: Verbalization of feelings and concerns over difficulty with self-care will improve 08/27/2023 0649 by Kathi Panning, RN Outcome: Progressing 08/27/2023 0233 by Kathi Panning, RN Outcome: Progressing Goal: Ability to communicate  needs accurately will improve 08/27/2023 0649 by Kathi Panning, RN Outcome: Progressing 08/27/2023 0233 by Kathi Panning, RN Outcome: Progressing   Problem: Nutrition: Goal: Risk of aspiration will decrease 08/27/2023 0649 by Kathi Panning, RN Outcome: Progressing 08/27/2023 0233 by Kathi Panning, RN Outcome: Progressing Goal: Dietary intake will improve 08/27/2023 0649 by Kathi Panning, RN Outcome: Progressing 08/27/2023 0233 by Kathi Panning, RN Outcome: Progressing   Problem: Education: Goal: Knowledge of General Education information will improve Description: Including pain rating scale, medication(s)/side effects and non-pharmacologic comfort measures 08/27/2023 0649 by Kathi Panning, RN Outcome: Progressing 08/27/2023 0233 by Kathi Panning, RN Outcome: Progressing   Problem: Health Behavior/Discharge Planning: Goal: Ability to manage health-related needs will improve 08/27/2023 0649 by Kathi Panning, RN Outcome: Progressing 08/27/2023 0233 by Kathi Panning, RN Outcome: Progressing   Problem: Clinical Measurements: Goal: Ability to maintain clinical measurements within normal limits will improve 08/27/2023 0649 by Kathi Panning, RN Outcome: Progressing 08/27/2023 0233 by Kathi Panning, RN Outcome: Progressing Goal: Will remain free from infection 08/27/2023 0649 by Kathi Panning, RN Outcome: Progressing 08/27/2023 0233 by Kathi Panning, RN Outcome: Progressing Goal: Diagnostic test  results will improve 08/27/2023 0649 by Kathi Panning, RN Outcome: Progressing 08/27/2023 0233 by Kathi Panning, RN Outcome: Progressing Goal: Respiratory complications will improve 08/27/2023 0649 by Kathi Panning, RN Outcome: Progressing 08/27/2023 0233 by Kathi Panning, RN Outcome: Progressing Goal: Cardiovascular complication will be avoided 08/27/2023 0649 by Kathi Panning, RN Outcome: Progressing 08/27/2023 0233 by Kathi Panning, RN Outcome: Progressing   Problem: Activity: Goal: Risk for activity intolerance will decrease 08/27/2023 0649 by Kathi Panning, RN Outcome: Progressing 08/27/2023 0233 by Kathi Panning, RN Outcome: Progressing   Problem: Nutrition: Goal: Adequate nutrition will be maintained 08/27/2023 0649 by Kathi Panning, RN Outcome: Progressing 08/27/2023 0233 by Kathi Panning, RN Outcome: Progressing   Problem: Coping: Goal: Level of anxiety will decrease 08/27/2023 0649 by Kathi Panning, RN Outcome: Progressing 08/27/2023 0233 by Kathi Panning, RN Outcome: Progressing   Problem: Elimination: Goal: Will not experience complications related to bowel motility 08/27/2023 0649 by Kathi Panning, RN Outcome: Progressing 08/27/2023 0233 by Kathi Panning, RN Outcome: Progressing Goal: Will not experience complications related to urinary retention 08/27/2023 0649 by Kathi Panning, RN Outcome: Progressing 08/27/2023 0233 by Kathi Panning, RN Outcome: Progressing   Problem: Pain Managment: Goal: General experience of comfort will improve and/or be controlled 08/27/2023 0649 by Kathi Panning, RN Outcome: Progressing 08/27/2023 0233 by Kathi Panning, RN Outcome: Progressing   Problem: Safety: Goal: Ability to remain free from injury will improve 08/27/2023 0649 by Kathi Panning, RN Outcome: Progressing 08/27/2023 0233 by Kathi Panning, RN Outcome: Progressing   Problem: Skin Integrity: Goal: Risk for impaired skin integrity will  decrease 08/27/2023 0649 by Kathi Panning, RN Outcome: Progressing 08/27/2023 0233 by Kathi Panning, RN Outcome: Progressing

## 2023-08-27 NOTE — Evaluation (Signed)
 Physical Therapy Evaluation Patient Details Name: Whitney [name] MRN: [id number] DOB: December 14, 1955 Today's Date: 08/27/2023  History of Present Illness  Whitney [name] is a 68 y.o. female with medical history significant for paroxysmal atrial fibrillation currently anticoagulated on Eliquis , NASH cirrhosis, type 2 diabetes mellitus companied by diabetic peripheral polyneuropathy, hyperlipidemia, aortic stenosis status post aortic valve replacement in August 2019, Sosan syndrome, moderate persistent asthma, thrombocytopenia with baseline platelet count 40s to 90s, who is admitted to [facility] on 08/25/2023 with acute encephalopathy after presenting from home to [facility] ED for evaluation of altered mental status.  Clinical Impression  Pt presents with admitting diagnosis above. Co-treat with OT. Pt today was able to stand with 2 person HHA CGA and take sidesteps along EOB. Further mobility was limited due to EEG. 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 Recommend HHPT upon DC. PT will continue to follow.         If plan is discharge home, recommend the following: A little help with walking and/or transfers;A little help with bathing/dressing/bathroom;Assistance with cooking/housework;Direct supervision/assist for medications management;Assist for transportation;Supervision due to cognitive status;Help with stairs or ramp for entrance   Can travel by private vehicle        Equipment Recommendations Rolling walker (2 wheels)  Recommendations for Other Services       Functional Status Assessment Patient has had a recent decline in their functional status and demonstrates the ability to make significant improvements in function in a reasonable and predictable amount of time.     Precautions / Restrictions Precautions Precautions: Fall;Other (comment) (EEG) Recall of Precautions/Restrictions: Impaired Restrictions Weight Bearing  Restrictions Per Provider Order: No      Mobility  Bed Mobility Overal bed mobility: Needs Assistance Bed Mobility: Supine to Sit, Sit to Supine     Supine to sit: Supervision, HOB elevated, Used rails Sit to supine: Supervision   General bed mobility comments: min verbal cues for task initiation and continuation to complete sitting EOB and to initite LEs mgt back onto bed    Transfers Overall transfer level: Needs assistance Equipment used: 2 person hand held assist Transfers: Sit to/from Stand Sit to Stand: Contact guard assist           General transfer comment: Able to take sidesteps along EOB.    Ambulation/Gait               General Gait Details: Limited due to EEG  Stairs            Wheelchair Mobility     Tilt Bed    Modified Rankin (Stroke Patients Only)       Balance Overall balance assessment: Needs assistance Sitting-balance support: No upper extremity supported, Feet supported Sitting balance-Leahy Scale: Good     Standing balance support: Bilateral upper extremity supported, During functional activity Standing balance-Leahy Scale: Poor                               Pertinent Vitals/Pain Pain Assessment Pain Assessment: No/denies pain    Home Living Family/patient expects to be discharged to:: Private residence Living Arrangements: Children Available Help at Discharge: Family;Available 24 hours/day Type of Home: House Home Access: Stairs to enter Entrance Stairs-Rails: None Entrance Stairs-Number of Steps: 2 steps + 2 steps   Home Layout: One level;Laundry or work area in [employer]: Grab bars -  toilet;Grab bars - tub/shower;Shower seat;Rollator (4 wheels)      Prior Function Prior Level of Function : Driving;Independent/Modified Independent             Mobility Comments: pt reports typically ambulating without DME, does utilize rollator PRN ADLs Comments: Manages her own meds, cooks,  home mgt;very independent. Caretaking for her son at times driving him to doctors appointments     Extremity/Trunk Assessment   Upper Extremity Assessment Upper Extremity Assessment: Defer to OT evaluation    Lower Extremity Assessment Lower Extremity Assessment: Overall WFL for tasks assessed    Cervical / Trunk Assessment Cervical / Trunk Assessment: Normal  Communication   Communication Communication: No apparent difficulties    Cognition Arousal: Alert Behavior During Therapy: WFL for tasks assessed/performed   PT - Cognitive impairments: Problem solving, Sequencing, Safety/Judgement, Initiation                       PT - Cognition Comments: Difficulty sequencing and problem solving Following commands: Intact       Cueing Cueing Techniques: Verbal cues     General Comments General comments (skin integrity, edema, etc.): VSS    Exercises     Assessment/Plan    PT Assessment Patient needs continued PT services  PT Problem List Decreased strength;Decreased range of motion;Decreased activity tolerance;Decreased balance;Decreased coordination;Decreased mobility;Decreased cognition;Decreased knowledge of use of DME;Decreased safety awareness;Decreased knowledge of precautions;Cardiopulmonary status limiting activity       PT Treatment Interventions DME instruction;Gait training;Stair training;Functional mobility training;Therapeutic activities;Therapeutic exercise;Balance training;Neuromuscular re-education;Cognitive remediation;Patient/family education    PT Goals (Current goals can be found in the Care Plan section)  Acute Rehab PT Goals Patient Stated Goal: to go home PT Goal Formulation: With patient/family Time For Goal Achievement: 09/10/23 Potential to Achieve Goals: Good    Frequency Min 3X/week     Co-evaluation PT/OT/SLP Co-Evaluation/Treatment: Yes Reason for Co-Treatment: For patient/therapist safety;To address functional/ADL  transfers PT goals addressed during session: Mobility/safety with mobility OT goals addressed during session: ADL's and self-care       AM-PAC PT "6 Clicks" Mobility  Outcome Measure Help needed turning from your back to your side while in a flat bed without using bedrails?: A Little Help needed moving from lying on your back to sitting on the side of a flat bed without using bedrails?: A Little Help needed moving to and from a bed to a chair (including a wheelchair)?: A Little Help needed standing up from a chair using your arms (e.g., wheelchair or bedside chair)?: A Little Help needed to walk in hospital room?: A Little Help needed climbing 3-5 steps with a railing? : A Lot 6 Click Score: 17    End of Session Equipment Utilized During Treatment: Gait belt Activity Tolerance: Patient tolerated treatment well Patient left: in bed;with call bell/phone within reach;with bed alarm set;with family/visitor present Nurse Communication: Mobility status PT Visit Diagnosis: Other abnormalities of gait and mobility (R26.89)    Time: 1610-9604 PT Time Calculation (min) (ACUTE ONLY): 32 min   Charges:   PT Evaluation $PT Eval Moderate Complexity: 1 Mod   PT General Charges $$ ACUTE PT VISIT: 1 Visit         Rodgers Clack, PT, DPT Acute Rehab Services [phone]   Nobie Alleyne 08/27/2023, 3:54 PM

## 2023-08-27 NOTE — Progress Notes (Addendum)
 Pt more alert at this time , answers orientation questions A&Ox 3. Pt restless asking for daughter and wanting to know what happened to her.  Pt attempts to get oob and continues to remove CPAP. CPAP removed 02 placed. She also asks for water  explained that swallow eval will need to be redone before drinking water  but since she is now very alert we can attempt po meds crushed  in pureed per speech notes. Provider on call aware of the above. Daughter agreed to come stay the night with pt.

## 2023-08-27 NOTE — Progress Notes (Signed)
 NEUROLOGY CONSULT FOLLOW UP NOTE   Date of service: August 27, 2023 Patient Name: Whitney [name] MRN:  [id number] DOB:  1955-12-24  Interval Hx/subjective  Patient has been hemodynamically stable overnight and has also been afebrile.  She has no leukocytosis.  Family states that her mental status began to improve sometime yesterday afternoon, and early yesterday night, patient awoke, was alert and oriented x 3 and requested to speak with family.  Mental status is much improved today with the patient being oriented to person, place, time and situation and able to describe some events of the previous day Vitals   Vitals:   08/27/23 0500 08/27/23 0700 08/27/23 0811 08/27/23 0829  BP:   (!) 145/86   Pulse:   83   Resp:   18   Temp:   98.1 F (36.7 C)   TempSrc:   Oral   SpO2:   99% 98%  Weight: 113.3 kg     Height:  5' 3.75" (1.619 m)       Body mass index is 43.21 kg/m.  Physical Exam   Constitutional: Well-nourished, well-developed elderly patient in no acute distress Psych: Affect appropriate for situation Eyes: No scleral injection.  HENT: No OP obstrucion.  Head: Normocephalic.  Cardiovascular: Normal rate and regular rhythm Respiratory: Effort normal, non-labored breathing.  Skin: Bruising and healing animal bite noted to left arm  Neurologic Examination    NEURO:  Mental Status: Patient is alert and oriented to person place time and situation.  She has some recall of yesterday's events.  She is able to follow simple commands but requires repeated prompts for two-step commands.  She is able to do simple addition but not subtraction.  At times, she perseverates on the previous question that was asked.  She is able to name 1 animal with 4 feet.  3 out of 3 registration, 1 out of 3 recall Speech/Language: Speech is in full sentences without aphasia or distal 3  Cranial Nerves:  II: PERRL.   III, IV, VI: EOMI. Eyelids elevate symmetrically.  V: Facial sensation symmetric to  light touch VII: Face is symmetrical resting VIII: hearing intact to voice. X: Phonation normal XI: Shoulder shrug symmetrical XII: Tongue midline Motor: Moves all 4 extremities with good antigravity strength Tone: is normal and bulk is normal Sensation-intact to light touch throughout Coordination: Finger-to-nose intact bilaterally Gait- deferred  NIHSS:  1a Level of Conscious.: 0 1b LOC Questions: 0 1c LOC Commands: 0 2 Best Gaze: 0 3 Visual: 0 4 Facial Palsy: 0 5a Motor Arm - left: 0 5b Motor Arm - Right: 0 6a Motor Leg - Left: 0 6b Motor Leg - Right: 0 7 Limb Ataxia: 0 8 Sensory: 0 9 Best Language: 0 10 Dysarthria: 0 11 Extinct and Inattention.: 0 TOTAL: 0     Medications  Current Facility-Administered Medications:    0.9 %  sodium chloride  infusion, , Intravenous, Continuous, Katsaros, Lindsey R, RPH, Last Rate: 125 mL/hr at 08/27/23 4098, Infusion Verify at 08/27/23 1191   acetaminophen  (TYLENOL ) tablet 650 mg, 650 mg, Oral, Q6H PRN, 650 mg at 08/27/23 0620 **OR** acetaminophen  (TYLENOL ) suppository 650 mg, 650 mg, Rectal, Q6H PRN, Howerter, Justin B, DO   acyclovir  (ZOVIRAX ) 700 mg in dextrose  5 % 100 mL IVPB, 700 mg, Intravenous, Q8H, Katsaros, Lindsey R, RPH, Last Rate: 114 mL/hr at 08/27/23 0352, 700 mg at 08/27/23 0352   albuterol  (PROVENTIL ) (2.5 MG/3ML) 0.083% nebulizer solution 2.5 mg, 2.5 mg, Nebulization, Q4H PRN, Howerter, Austine Lefort  B, DO, 2.5 mg at 08/27/23 0006   ampicillin  (OMNIPEN) 2 g in sodium chloride  0.9 % 100 mL IVPB, 2 g, Intravenous, Q4H, de Thayne Fine, Luverne E, NP, Last Rate: 300 mL/hr at 08/27/23 0808, 2 g at 08/27/23 0808   apixaban  (ELIQUIS ) tablet 5 mg, 5 mg, Oral, BID, Howerter, Justin B, DO, 5 mg at 08/27/23 1914   arformoterol  (BROVANA ) nebulizer solution 15 mcg, 15 mcg, Nebulization, BID, 15 mcg at 08/27/23 0829 **AND** umeclidinium bromide  (INCRUSE ELLIPTA ) 62.5 MCG/ACT 1 puff, 1 puff, Inhalation, Daily, Howerter, Justin B, DO, 1 puff at  08/27/23 0831   cefTRIAXone  (ROCEPHIN ) 2 g in sodium chloride  0.9 % 100 mL IVPB, 2 g, Intravenous, Q12H, de Thayne Fine, Macy E, NP, Last Rate: 200 mL/hr at 08/27/23 0806, 2 g at 08/27/23 7829   ezetimibe  (ZETIA ) tablet 10 mg, 10 mg, Oral, QHS, Howerter, Justin B, DO, 10 mg at 08/26/23 2356   fentaNYL  (SUBLIMAZE ) injection 25 mcg, 25 mcg, Intravenous, Q2H PRN, Howerter, Justin B, DO   insulin  aspart (novoLOG ) injection 0-6 Units, 0-6 Units, Subcutaneous, Q4H, Amponsah, Prosper M, MD   melatonin tablet 3 mg, 3 mg, Oral, QHS PRN, Howerter, Justin B, DO   metoprolol  tartrate (LOPRESSOR ) injection 2.5 mg, 2.5 mg, Intravenous, Q2H PRN, Vita Grip, MD   nadolol  (CORGARD ) tablet 40 mg, 40 mg, Oral, Daily, Howerter, Justin B, DO, 40 mg at 08/27/23 5621   naloxone  (NARCAN ) injection 0.4 mg, 0.4 mg, Intravenous, PRN, Howerter, Justin B, DO   ondansetron  (ZOFRAN ) injection 4 mg, 4 mg, Intravenous, Q6H PRN, Howerter, Justin B, DO   [COMPLETED] vancomycin  (VANCOREADY) IVPB 2000 mg/400 mL, 2,000 mg, Intravenous, Once, Last Rate: 200 mL/hr at 08/26/23 1249, 2,000 mg at 08/26/23 1249 **FOLLOWED BY** vancomycin  (VANCOREADY) IVPB 750 mg/150 mL, 750 mg, Intravenous, Q12H, Terrance Ferretti, RPH, Stopped at 08/27/23 0302  Labs and Diagnostic Imaging   CBC:  Recent Labs  Lab 08/25/23 1735 08/25/23 1736 08/26/23 0406 08/27/23 0618  WBC 5.5  --  4.9 5.5  NEUTROABS 3.0  --  2.6  --   HGB 11.6*   < > 12.5 12.4  HCT 37.8   < > 40.4 40.2  MCV 100.0  --  99.5 97.8  PLT 92*  --  80* 99*   < > = values in this interval not displayed.    Basic Metabolic Panel:  Lab Results  Component Value Date   NA 141 08/27/2023   K 4.6 08/27/2023   CO2 25 08/27/2023   GLUCOSE 115 (H) 08/27/2023   BUN 28 (H) 08/27/2023   CREATININE 0.96 08/27/2023   CALCIUM  8.7 (L) 08/27/2023   GFRNONAA >60 08/27/2023   GFRAA >60 12/17/2018   Lipid Panel:  Lab Results  Component Value Date   LDLCALC 74 08/27/2023    HgbA1c:  Lab Results  Component Value Date   HGBA1C 5.1 08/26/2023   Urine Drug Screen:     Component Value Date/Time   LABOPIA NONE DETECTED 04/13/2022 1651   COCAINSCRNUR NONE DETECTED 04/13/2022 1651   LABBENZ NONE DETECTED 04/13/2022 1651   AMPHETMU NONE DETECTED 04/13/2022 1651   THCU NONE DETECTED 04/13/2022 1651   LABBARB NONE DETECTED 04/13/2022 1651    Alcohol Level     Component Value Date/Time   ETH <10 08/25/2023 1735   INR  Lab Results  Component Value Date   INR 1.5 (H) 08/26/2023   APTT  Lab Results  Component Value Date   APTT 41 (H) 08/25/2023  Lipid Panel     Component Value Date/Time   CHOL 124 08/27/2023 0618   CHOL 176 05/20/2023 1251   TRIG 106 08/27/2023 0618   HDL 29 (L) 08/27/2023 0618   HDL 39 (L) 05/20/2023 1251   CHOLHDL 4.3 08/27/2023 0618   VLDL 21 08/27/2023 0618   LDLCALC 74 08/27/2023 0618   LDLCALC 118 (H) 05/20/2023 1251   LABVLDL 19 05/20/2023 1251    Hemoglobin A1c 5.1 Ammonia 29 B12 681 TSH 1.350  CT Head without contrast(Personally reviewed): No acute abnormality  CT angio Head and Neck with contrast(Personally reviewed): No LVO, generalized arterial tortuosity  MRI Brain(Personally reviewed): Small acute posterior right frontal white matter infarct  rEEG:  Continuous slow, generalized, long-term EEG for today pending  Echocardiogram: pending  Assessment   Whitney [name] is a 68 y.o. female with a history of Sjogren syndrome, diabetes, fibromyalgia, depression, GERD, aortic valve replacement, A-fib on Eliquis , TIA, NASH cirrhosis, pulmonary hypertension, thrombocytopenia and home O2 use and recent dog bite who presented with altered mental status.  According to patient's family, she has been getting progressively more and more drowsy all week and has been sleeping more and more.  Her son states that he has been checking her vital signs and that she has had low-grade fevers on and off throughout the week.   She was seen for a dog bite and was prescribed antibiotics which she has been taking as well as Norco, which her son states she did not pick up from the pharmacy.  Son states that she has missed her medications at least twice this week due to drowsiness.  Suspect that stroke is embolic in etiology in the setting of A-fib with a missed dose of Eliquis .  However, small stroke does not explain patient's altered mental status.  She does not have any seizure risk factors, has never had a seizure, has no family members with seizures, has never had meningitis or encephalitis, has never had a serious head injury or intracranial surgery per family but did have a prolonged illness after a tick bite many years ago.  Toxic metabolic encephalopathy could explain patient's altered mental status, but she has no leukocytosis and Tmax has been 99.9 while she has been here.  Will obtain routine EEG to rule out seizure activity as the cause of patient's altered mental status.  Also will start patient on appropriate antimicrobial coverage for meningitis given low-grade fevers and increasing altered mental status over the course of the last week.  Did not want to perform lumbar puncture at this time due to anticoagulation with Eliquis  and thrombocytopenia.  On 4/22, patient is noted to be alert and oriented to person place time and situation.  Cognitively, she is not quite back to baseline and has difficulty performing simple math and impaired memory.  Family states that she began to improve sometime yesterday afternoon after antibiotics were administered.  Long-term EEG is in place, awaiting today's read.  Will need to discuss whether lumbar puncture is worth the risk of discontinuing Eliquis  or full course of antibiotics should simply be completed.  From stroke risk perspective, will start low-dose statin, as patient's LDL is slightly above goal with current therapy of Zetia .  Patient states she has taken statins in the past but  does not have an allergy to them and does not recall any intolerable side effects.  Recommendations   - Permissive HTN x48 hrs goal BP <220/110. PRN labetalol  or hydralazine  if BP above these parameters.  Avoid oral antihypertensives. - TTE - Add rosuvastatin  10 mg daily - Continue anticoagulation with Eliquis  - q4 hr neuro checks - STAT head CT for any change in neuro exam - Tele - PT/OT/SLP - Stroke education - Amb referral to neurology upon discharge   - LTM EEG  - Serum paraneoplastic panel -Workup for causes of toxic metabolic encephalopathy per primary team ______________________________________________________________________  Patient seen by NP and then by MD, MD to edit note as needed.  Signed, Cortney E Bucky Cardinal, NP [facility]    Attending Neurohospitalist Addendum Patient seen and examined with APP/Resident. Agree with the history and physical as documented above. Agree with the plan as documented, which I helped formulate. I have edited the note above to reflect my full findings and recommendations. I have independently reviewed the chart, obtained history, review of systems and examined the patient.I have personally reviewed pertinent head/neck/spine imaging (CT/MRI). Please feel free to call with any questions.  Empiric CNS coverage was started yesterday 2/2 obtundation but she rapidly improved within 3 hours of starting those medications - too fast for them to be actually effective if she had meningitis - so I will discontinue them. Continue cEEG overnight, will d/c tmrw if nothing epileptiform overnight. Will continue to follow.  -- Greg Leaks, MD [facility] [phone]  If 7pm- 7am, please page neurology on call as listed in [facility].

## 2023-08-27 NOTE — Plan of Care (Signed)

## 2023-08-27 NOTE — Progress Notes (Signed)
 LTM EEG hooked up and running - no initial skin breakdown - push button tested - [facility] monitoring.

## 2023-08-28 ENCOUNTER — Encounter: Payer: Self-pay | Admitting: Emergency Medicine

## 2023-08-28 ENCOUNTER — Inpatient Hospital Stay (HOSPITAL_COMMUNITY)

## 2023-08-28 ENCOUNTER — Encounter (HOSPITAL_COMMUNITY)

## 2023-08-28 ENCOUNTER — Ambulatory Visit: Payer: Medicare PPO | Admitting: Emergency Medicine

## 2023-08-28 DIAGNOSIS — R569 Unspecified convulsions: Secondary | ICD-10-CM | POA: Diagnosis not present

## 2023-08-28 DIAGNOSIS — R4182 Altered mental status, unspecified: Secondary | ICD-10-CM | POA: Diagnosis not present

## 2023-08-28 DIAGNOSIS — G9340 Encephalopathy, unspecified: Secondary | ICD-10-CM | POA: Diagnosis not present

## 2023-08-28 LAB — MISC LABCORP TEST (SEND OUT): Labcorp test code: 9985

## 2023-08-28 LAB — GLUCOSE, CAPILLARY
Glucose-Capillary: 96 mg/dL (ref 70–99)
Glucose-Capillary: 131 mg/dL — ABNORMAL HIGH (ref 70–99)
Glucose-Capillary: 142 mg/dL — ABNORMAL HIGH (ref 70–99)
Glucose-Capillary: 135 mg/dL — ABNORMAL HIGH (ref 70–99)

## 2023-08-28 NOTE — Progress Notes (Signed)
 Physical Therapy Treatment Patient Details Name: Whitney [name] MRN: [id number] DOB: [birth year] Today's Date: 08/28/2023   History of Present Illness Whitney [name] is a 68 y.o. female with medical history significant for paroxysmal atrial fibrillation currently anticoagulated on Eliquis , NASH cirrhosis, type 2 diabetes mellitus companied by diabetic peripheral polyneuropathy, hyperlipidemia, aortic stenosis status post aortic valve replacement in August 2019, Sosan syndrome, moderate persistent asthma, thrombocytopenia with baseline platelet count 40s to 90s, who is admitted to [facility] on 08/25/2023 with acute encephalopathy after presenting from home to [facility] ED for evaluation of altered mental status.    PT Comments  Pt tolerated treatment well today. Pt initially reported dizziness at beginning of session however VSS and no nystagmus was noted with smooth pursuits. BP: 104/74 (83) seated, BP: 104/89 (94) standing. Pt was finally able to ambulate in hallway after lots of encouragement. Pt required CGA using RW and a chair follow due to decreased activity tolerance. Patient needs to practice stairs next session in order for safe DC home. No change in DC/DME recs at this time. PT will continue to follow.     If plan is discharge home, recommend the following: A little help with walking and/or transfers;A little help with bathing/dressing/bathroom;Assistance with cooking/housework;Direct supervision/assist for medications management;Assist for transportation;Supervision due to cognitive status;Help with stairs or ramp for entrance   Can travel by private vehicle        Equipment Recommendations  Rolling walker (2 wheels)    Recommendations for Other Services       Precautions / Restrictions Precautions Precautions: Fall Recall of Precautions/Restrictions: Impaired Restrictions Weight Bearing Restrictions Per Provider Order: No     Mobility  Bed Mobility                General bed mobility comments: Pt in recliner    Transfers Overall transfer level: Needs assistance Equipment used: Rolling walker (2 wheels) Transfers: Sit to/from Stand Sit to Stand: Contact guard assist, Min assist           General transfer comment: Cues for hand placement. Able to perform multiple sit to stands. Min A once fatigued.    Ambulation/Gait Ambulation/Gait assistance: Contact guard assist Gait Distance (Feet): 50 Feet Assistive device: Rolling walker (2 wheels) Gait Pattern/deviations: Trunk flexed, Drifts right/left, Wide base of support, Decreased stride length, Step-through pattern Gait velocity: decreased     General Gait Details: no LOB noted. Pt required cues for navigating obstacles especially on the L side.   Stairs             Wheelchair Mobility     Tilt Bed    Modified Rankin (Stroke Patients Only)       Balance Overall balance assessment: Needs assistance Sitting-balance support: No upper extremity supported, Feet supported Sitting balance-Leahy Scale: Good     Standing balance support: Bilateral upper extremity supported, During functional activity Standing balance-Leahy Scale: Poor Standing balance comment: reliant on external support                            Communication Communication Communication: No apparent difficulties  Cognition Arousal: Alert Behavior During Therapy: WFL for tasks assessed/performed   PT - Cognitive impairments: Problem solving, Sequencing, Safety/Judgement, Initiation                       PT - Cognition Comments: Difficulty sequencing and problem solving Following commands: Intact  Cueing Cueing Techniques: Verbal cues  Exercises      General Comments General comments (skin integrity, edema, etc.): BP: 104/74 (83) seated, BP: 104/89 (94) standing      Pertinent Vitals/Pain Pain Assessment Pain Assessment: No/denies pain    Home Living                           Prior Function            PT Goals (current goals can now be found in the care plan section) Progress towards PT goals: Progressing toward goals    Frequency    Min 3X/week      PT Plan      Co-evaluation              AM-PAC PT "6 Clicks" Mobility   Outcome Measure  Help needed turning from your back to your side while in a flat bed without using bedrails?: A Little Help needed moving from lying on your back to sitting on the side of a flat bed without using bedrails?: A Little Help needed moving to and from a bed to a chair (including a wheelchair)?: A Little Help needed standing up from a chair using your arms (e.g., wheelchair or bedside chair)?: A Little Help needed to walk in hospital room?: A Little Help needed climbing 3-5 steps with a railing? : A Lot 6 Click Score: 17    End of Session Equipment Utilized During Treatment: Gait belt Activity Tolerance: Patient tolerated treatment well Patient left: with family/visitor present;in chair;with call bell/phone within reach Nurse Communication: Mobility status PT Visit Diagnosis: Other abnormalities of gait and mobility (R26.89)     Time: 1610-9604 PT Time Calculation (min) (ACUTE ONLY): 31 min  Charges:    $Gait Training: 23-37 mins PT General Charges $$ ACUTE PT VISIT: 1 Visit                     Whitney [name], PT, DPT Acute Rehab Services [phone]    Whitney [name] 08/28/2023, 3:05 PM

## 2023-08-28 NOTE — Care Management Important Message (Signed)
 Important Message  Patient Details  Name: Whitney [name] MRN: [id number] Date of Birth: Oct 19, 1955   Important Message Given:  Yes - Medicare IM     Wynonia Hedges 08/28/2023, 4:03 PM

## 2023-08-28 NOTE — Progress Notes (Signed)
 12:52AM  Informed respiratory therapist regarding active CPAP order for patient at bedtime. RT said patient is refusing for the CPAP.

## 2023-08-28 NOTE — Progress Notes (Signed)
 PROGRESS NOTE   SHENEKA SCHROM  ZOX:[id number]    DOB: [birth year]    DOA: 08/25/2023  PCP: Jimmey Mould, MD   I have briefly reviewed patients previous medical records in [facility].   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  She was bit on the left forearm on 4/15 by her brother's dog (immunized), seen in the ED and discharged home on Augmentin ?  Amoxicillin , unclear compliance per family, subsequently developed somnolence, generalized weakness, intermittent slurred speech, seen in ED on 4/19 at which time evaluation including CT head, UA felt to be unremarkable and was discharged home, brought back to the ED due to progressive worsening mental status changes.  Admitted for acute metabolic encephalopathy, unclear etiology, neurology consulted, slowly improving.   Assessment & Plan:  Acute metabolic encephalopathy Agree with neurology that the small stroke does not explain patient's altered mental status. LTM EEG EEG shows encephalopathic process but no seizures. Low index of suspicion for meningitis.  Had been started briefly on meningitic doses of IV antibiotics.  LP was deferred due to thrombocytopenia.  However within a couple of hours of initiation of antibiotics, mental status improved and meningitis was felt less likely and antibiotics were discontinued. CMP and CBC unremarkable.  VBG showed elevated pCO2 at 60 but normal pH indicated appropriate compensation.  Ammonia levels 23 > 29.  B12: 681.  A1c 5.1.  TSH 1.350.  Lactic negative.  Flu/RSV/SARS coronavirus 2 by RT-PCR  all negative.  UA not indicative of UTI.  Blood alcohol level less than 10. Despite extensive above evaluation, etiology of her AMS not clear As discussed with patient's granddaughter/pediatrician at bedside, she has had similar AMS during prior hospitalization for respiratory issues. Per family, AMS 50% better.  Continue delirium precautions, reorientation, supportive care and mobilization-discussed with nursing. Continue to monitor.  Acute embolic stroke: MRI brain: Small acute posterior right frontal white matter infarct.  CTA head and neck: Negative large vessel occlusion. LDL 74, A1c 5.1 2D echo: LVEF >75%, hyperdynamic, grade 3 diastolic dysfunction.  Severely elevated PASP.  Severe TR negative bubble study/no evidence of intra-atrial shunt. Continue apixaban  anticoagulation-stroke felt to be due to missed dose/doses of Eliquis .  Compliance to be counseled Therapies recommend home health PT, OT and outpatient SLP but given her mental status changes, she is unsafe to discharge home and be able to take care of herself.  Moreover her brother who lives with her is in and out of hospitals and is currently hospitalized as well.  Will need to monitor progress.  Dyslipidemia LDL 74, goal <70 Now on atorvastatin 10 Mg daily and Zetia   Recent dog bite - Per family, patient was bit by dog on 5/15 and was treated outpatient with Augmentin  and as needed Percocet.   Site of dog bite on left forearm appears to have healed with scabbing.  No acute findings of cellulitis or abscess Is not getting Augmentin ,?  Completed course or at this time see no further indication for same.  Monitor.   Paroxysmal A-fib Paroxysms of mild A-fib with RVR.  Remains on telemetry Continue nadolol  40 mg daily and apixaban  5 Mg twice daily.  NASH cirrhosis Chronic thrombocytopenia - MELD score of 13.   - LFTs normal, Platelet of 99, improved - Continue nadalol Stable   T2DM Diabetic neuropathy A1c 5.1.  Tightly  controlled. Discontinue SSI.   Moderate persistent asthma - Continue home bronchodilators and as needed albuterol  nebs   OSA - Continue CPAP at night   Sjogren syndrome - Continue plaquenil    Pulmonary HTN - Continue tadalafil    Body mass index is 42.18 kg/m.   DVT prophylaxis: SCDs Start: 08/25/23 1931     Code Status: Full Code:  Family Communication: Daughter, granddaughter (pediatrician) and son at bedside Disposition:  Status is: Inpatient Remains inpatient appropriate because: Pending further improvement in mental status changes so she is safe to discharge home to care for herself although will need higher level of care.  Unsafe to DC home by herself at this time.     Consultants:   Neurology  Procedures:     Subjective:  Patient interviewed and examined along with her extended family above at bedside.  As per family, patient's mental status is 50% better than yesterday.  Patient however continues to be confused, alert and oriented to self, partly to place and perseverates and upset about poor nursing care yesterday.  No physical complaints reported.  Objective:   Vitals:   08/28/23 0807 08/28/23 0900 08/28/23 0903 08/28/23 1200  BP: 105/69   103/80  Pulse: 74 76  70  Resp: 16 19  18   Temp: 97.6 F (36.4 C)   98 F (36.7 C)  TempSrc: Oral   Oral  SpO2: 93% 96% 96% 98%  Weight:      Height:        General exam: Elderly female, moderately built and obese sitting up comfortably in bed without distress, getting ready to eat lunch. Respiratory system: Clear to auscultation. Respiratory effort normal. Cardiovascular system: S1 & S2 heard, RRR. No JVD, murmurs, rubs, gallops or clicks. No pedal edema.  Telemetry personally reviewed: Mostly sinus rhythm with periodic PAF with mild RVR. Gastrointestinal system: Abdomen is nondistended, soft and nontender. No organomegaly or masses felt. Normal bowel sounds heard. Central nervous system: Mental status as noted  above. No focal neurological deficits. Extremities: Symmetric 5 x 5 power. Skin: Left forearm dog bite site healing well with scabbing and no acute findings Psychiatry: Judgement and insight impaired. Mood & affect somewhat agitated.     Data Reviewed:   I have personally reviewed following labs and imaging studies   CBC: Recent Labs  Lab 08/24/23 1154 08/24/23 1415 08/25/23 1735 08/25/23 1736 08/25/23 2132 08/26/23 0406 08/27/23 0618  WBC 4.6  --  5.5  --   --  4.9 5.5  NEUTROABS 2.5  --  3.0  --   --  2.6  --   HGB 11.8*   < > 11.6*   < > 13.6 12.5 12.4  HCT 37.5   < > 37.8   < > 40.0 40.4 40.2  MCV 96.4  --  100.0  --   --  99.5 97.8  PLT 90*  --  92*  --   --  80* 99*   < > = values in this interval not displayed.    Basic Metabolic Panel: Recent Labs  Lab 08/24/23 1154 08/24/23 1415 08/25/23 1735 08/25/23 1736 08/25/23 2125 08/25/23 2132 08/26/23 0406 08/27/23 0618  NA 137   < > 139 140  --  140 141 141  K 4.6   < > 4.9 4.8  --  4.8 5.1 4.6  CL 102  --  103 101  --   --  103 108  CO2 29  --  30  --   --   --  31 25  GLUCOSE 138*  --  143* 136*  --   --  95 115*  BUN 32*  --  23 29*  --   --  22 28*  CREATININE 1.03*  --  0.89 0.90  --   --  0.86 0.96  CALCIUM  8.6*  --  8.6*  --   --   --  9.0 8.7*  MG  --   --   --   --  2.2  --  2.4  --    < > = values in this interval not displayed.    Liver Function Tests: Recent Labs  Lab 08/24/23 1154 08/25/23 1735 08/26/23 0406 08/27/23 0618  AST 25 21 19 18   ALT 7 7 7 7   ALKPHOS 46 41 45 41  BILITOT 1.2 1.1 1.3* 1.2  PROT 5.9* 6.0* 6.5 5.9*  ALBUMIN  2.8* 2.7* 3.0* 2.7*    CBG: Recent Labs  Lab 08/27/23 1944 08/28/23 0624 08/28/23 1255  GLUCAP 103* 96 131*    Microbiology Studies:   Recent Results (from the past 240 hours)  Resp panel by RT-PCR (RSV, Flu A&B, Covid) Anterior Nasal Swab     Status: None   Collection Time: 08/25/23  6:23 PM   Specimen: Anterior Nasal Swab  Result Value Ref  Range Status   SARS Coronavirus 2 by RT PCR NEGATIVE NEGATIVE Final   Influenza A by PCR NEGATIVE NEGATIVE Final   Influenza B by PCR NEGATIVE NEGATIVE Final    Comment: (NOTE) The Xpert Xpress SARS-CoV-2/FLU/RSV plus assay is intended as an aid in the diagnosis of influenza from Nasopharyngeal swab specimens and should not be used as a sole basis for treatment. Nasal washings and aspirates are unacceptable for Xpert Xpress SARS-CoV-2/FLU/RSV testing.  Fact Sheet for Patients: BloggerCourse.com  Fact Sheet for Healthcare Providers: SeriousBroker.it  This test is not yet approved or cleared by the [place]  FDA and has been authorized for detection and/or diagnosis of SARS-CoV-2 by FDA under an Emergency Use Authorization (EUA). This EUA will remain in effect (meaning this test can be used) for the duration of the COVID-19 declaration under Section 564(b)(1) of the Act, 21 U.S.C. section 360bbb-3(b)(1), unless the authorization is terminated or revoked.     Resp Syncytial Virus by PCR NEGATIVE NEGATIVE Final    Comment: (NOTE) Fact Sheet for Patients: BloggerCourse.com  Fact Sheet for Healthcare Providers: SeriousBroker.it  This test is not yet approved or cleared by the [place]  FDA and has been authorized for detection and/or diagnosis of SARS-CoV-2 by FDA under an Emergency Use Authorization (EUA). This EUA will remain in effect (meaning this test can be used) for the duration of the COVID-19 declaration under Section 564(b)(1) of the Act, 21 U.S.C. section 360bbb-3(b)(1), unless the authorization is terminated or revoked.  Performed at [facility], [address] [place], [place] [address]   Culture, blood (Routine X 2) w Reflex to ID Panel     Status: None (Preliminary result)   Collection Time: 08/27/23  6:18 AM   Specimen: BLOOD  Result Value Ref  Range Status   Specimen Description BLOOD BLOOD LEFT ARM  Final   Special Requests   Final    BOTTLES DRAWN AEROBIC AND ANAEROBIC Blood Culture adequate volume  Culture   Final    NO GROWTH 1 DAY Performed at [facility], [address] [place], [place] [address]    Report Status PENDING  Incomplete  Culture, blood (Routine X 2) w Reflex to ID Panel     Status: None (Preliminary result)   Collection Time: 08/27/23  6:19 AM   Specimen: BLOOD  Result Value Ref Range Status   Specimen Description BLOOD BLOOD RIGHT HAND  Final   Special Requests   Final    BOTTLES DRAWN AEROBIC ONLY Blood Culture results may not be optimal due to an inadequate volume of blood received in culture bottles   Culture   Final    NO GROWTH 1 DAY Performed at [facility], [address], [place], [place] [address]    Report Status PENDING  Incomplete    Radiology Studies:  ECHOCARDIOGRAM COMPLETE BUBBLE STUDY Result Date: 08/27/2023    ECHOCARDIOGRAM REPORT   Patient Name:   MAYTTE JACOT Sperry Date of Exam: 08/27/2023 Medical Rec #:  [id number]      Height:       63.7 in Accession #:    [id number]     Weight:       249.8 lb Date of Birth:  1956-05-05       BSA:          2.143 m Patient Age:    68 years       BP:           145/86 mmHg Patient Gender: F              HR:           71 bpm. Exam Location:  Inpatient Procedure: 2D Echo, Cardiac Doppler, Color Doppler and Saline Contrast Bubble            Study (Both Spectral and Color Flow Doppler were utilized during            procedure). Indications:    Stroke  History:        Patient has prior history of Echocardiogram examinations, most                 recent 03/14/2023. COPD, Signs/Symptoms:Altered Mental Status and                 Dyspnea; Risk Factors:Sleep Apnea, Diabetes and Dyslipidemia.  Sonographer:    Aldon Ambrosia Referring Phys: [id number] PROSPER M AMPONSAH IMPRESSIONS  1. Left ventricular ejection fraction, by estimation, is >75%. The left ventricle has  hyperdynamic function. The left ventricle has no regional wall motion abnormalities. Left ventricular diastolic function could not be evaluated due to mitral annular calcification (moderate or greater) but hemodynamics suggestive of Grade III diastolic dysfunction. Elevated left atrial pressure. The E/e' is 17.  2. Right ventricular systolic function is normal. The right ventricular size is mildly enlarged. There is severely elevated pulmonary artery systolic pressure. The estimated right ventricular systolic pressure is 73.1 mmHg.  3. Left atrial size was severely dilated.  4. Severe mitral subvalvular calcification.  5. The mitral valve is degenerative. Moderate mitral valve regurgitation. Mild mitral stenosis. The mean mitral valve gradient is 5.0 mmHg with average heart rate of 70 bpm. Severe mitral annular calcification.  6. Tricuspid valve regurgitation is severe.  7. S/P 21 mm Edwards Inspiris Resilia valve present in the aortic position. Procedure Date: 12/05/17. No valvular regurgitation. No valvular stenosis (peak velocity 2.50m/s, MG 13.21mmHG, DI 0.47, EOA 1.33, triangular jet  contour, AT ). No PVL.  8. The inferior vena cava is dilated in size with <50% respiratory variability, suggesting right atrial pressure of 15 mmHg.  9. Agitated saline contrast bubble study was negative, with no evidence of any interatrial shunt. Conclusion(s)/Recommendation(s): No intracardiac source of embolism detected on this transthoracic study. Consider a transesophageal echocardiogram to exclude cardiac source of embolism if clinically indicated. FINDINGS  Left Ventricle: Left ventricular ejection fraction, by estimation, is >75%. The left ventricle has hyperdynamic function. The left ventricle has no regional wall motion abnormalities. The left ventricular internal cavity size was normal in size. There is borderline left ventricular hypertrophy. Left ventricular diastolic function could not be evaluated due to mitral  annular calcification (moderate or greater) but hemodynamics suggestive of Grade III diastolic dysfunction. Elevated left atrial pressure. The E/e' is 37. Right Ventricle: The right ventricular size is mildly enlarged. Right vetricular wall thickness was not well visualized. Right ventricular systolic function is normal. There is severely elevated pulmonary artery systolic pressure. The tricuspid regurgitant velocity is 3.81 m/s, and with an assumed right atrial pressure of 15 mmHg, the estimated right ventricular systolic pressure is 73.1 mmHg. Left Atrium: Left atrial size was severely dilated. Right Atrium: Right atrial size was normal in size. Pericardium: There is no evidence of pericardial effusion. Mitral Valve: The mitral valve is degenerative in appearance. Severe mitral annular calcification. Severe mitral subvalvular calcification. Moderate mitral valve regurgitation. Mild mitral valve stenosis. MV peak gradient, 15.2 mmHg. The mean mitral valve gradient is 5.0 mmHg with average heart rate of 70 bpm. Tricuspid Valve: The tricuspid valve is normal in structure. Tricuspid valve regurgitation is severe. No evidence of tricuspid stenosis. Aortic Valve: S/P 21 mm Edwards Inspiris Resilia valve present in the aortic position. Procedure Date: 12/05/17. No valvular regurgitation. No valvular stenosis (peak velocity 2.81m/s, MG 13.47mmHG, DI 0.47, EOA 1.33, triangular jet contour, AT ). No PVL. Aortic valve mean gradient measures 13.8 mmHg. Aortic valve peak gradient measures 24.6 mmHg. Aortic valve area, by VTI measures 1.33 cm. Pulmonic Valve: The pulmonic valve was grossly normal. Pulmonic valve regurgitation is mild. No evidence of pulmonic stenosis. Aorta: The aortic root and ascending aorta are structurally normal, with no evidence of dilitation. Venous: The inferior vena cava is dilated in size with less than 50% respiratory variability, suggesting right atrial pressure of 15 mmHg. IAS/Shunts: The  atrial septum is grossly normal. Agitated saline contrast was given intravenously to evaluate for intracardiac shunting. Agitated saline contrast bubble study was negative, with no evidence of any interatrial shunt.  LEFT VENTRICLE PLAX 2D LVIDd:         4.20 cm      Diastology LVIDs:         2.80 cm      LV e' medial:    7.18 cm/s LV PW:         1.00 cm      LV E/e' medial:  20.8 LV IVS:        1.10 cm      LV e' lateral:   10.60 cm/s LVOT diam:     1.90 cm      LV E/e' lateral: 14.1 LV SV:         73 LV SV Index:   34 LVOT Area:     2.84 cm  LV Volumes (MOD) LV vol d, MOD A2C: 163.0 ml LV vol d, MOD A4C: 114.0 ml LV vol s, MOD A2C: 50.4 ml LV vol s, MOD A4C:  45.7 ml LV SV MOD A2C:     112.6 ml LV SV MOD A4C:     114.0 ml LV SV MOD BP:      88.2 ml RIGHT VENTRICLE             IVC RV Basal diam:  4.70 cm     IVC diam: 2.10 cm RV Mid diam:    3.70 cm RV S prime:     10.00 cm/s TAPSE (M-mode): 1.3 cm LEFT ATRIUM              Index        RIGHT ATRIUM           Index LA diam:        4.50 cm  2.10 cm/m   RA Area:     17.70 cm LA Vol (A2C):   133.0 ml 62.05 ml/m  RA Volume:   48.80 ml  22.77 ml/m LA Vol (A4C):   112.0 ml 52.25 ml/m LA Biplane Vol: 128.0 ml 59.72 ml/m  AORTIC VALVE                     PULMONIC VALVE AV Area (Vmax):    1.25 cm      PV Vmax:          1.12 m/s AV Area (Vmean):   1.37 cm      PV Peak grad:     5.1 mmHg AV Area (VTI):     1.33 cm      PR End Diast Vel: 10.50 msec AV Vmax:           248.00 cm/s AV Vmean:          170.000 cm/s AV VTI:            0.552 m AV Peak Grad:      24.6 mmHg AV Mean Grad:      13.8 mmHg LVOT Vmax:         109.00 cm/s LVOT Vmean:        81.900 cm/s LVOT VTI:          0.258 m LVOT/AV VTI ratio: 0.47  AORTA Ao Root diam: 3.60 cm Ao Asc diam:  3.60 cm MITRAL VALVE                TRICUSPID VALVE MV Area (PHT): 3.95 cm     TR Peak grad:   58.1 mmHg MV Area VTI:   1.52 cm     TR Vmax:        381.00 cm/s MV Peak grad:  15.2 mmHg MV Mean grad:  5.0 mmHg     SHUNTS MV  Vmax:       1.95 m/s     Systemic VTI:  0.26 m MV Vmean:      104.0 cm/s   Systemic Diam: 1.90 cm MV Decel Time: 192 msec MR Peak grad: 145.4 mmHg MR Mean grad: 86.0 mmHg MR Vmax:      603.00 cm/s MR Vmean:     429.0 cm/s MV E velocity: 149.00 cm/s MV A velocity: 64.90 cm/s MV E/A ratio:  2.30 Sunit Tolia Electronically signed by Olinda Bertrand Signature Date/Time: 08/27/2023/9:07:17 PM    Final    Overnight EEG with video Result Date: 08/27/2023 Arleene Lack, MD     08/28/2023 11:05 AM Patient Name: MAGGIE SENSENEY MRN: [id number] Epilepsy Attending: Arleene Lack Referring Physician/Provider: Eleni Griffin, MD Duration: 08/27/2023 0353 to 08/28/2023  4098 Patient history: 68 y.o. female with a history of Sjogren syndrome, diabetes, fibromyalgia, depression, GERD, aortic valve replacement, A-fib on Eliquis , TIA, NASH cirrhosis, pulmonary hypertension, thrombocytopenia and home O2 use and recent dog bite who presented with altered mental status.  EEG to evaluate for seizure Level of alertness: Awake, asleep AEDs during EEG study: None Technical aspects: This EEG study was done with scalp electrodes positioned according to the 10-20 International system of electrode placement. Electrical activity was reviewed with band pass filter of 1-70Hz , sensitivity of 7 uV/mm, display speed of 4mm/sec with a 60Hz  notched filter applied as appropriate. EEG data were recorded continuously and digitally stored.  Video monitoring was available and reviewed as appropriate. Description: No clear posterior dominant rhythm was seen. Sleep was characterized by vertex waves, maximal frontocentral region. EEG showed continuous generalized predominantly 5 to 7 Hz theta slowing admixed with intermittent 2-3hz  delta slowing. Hyperventilation and photic stimulation were not performed.    ABNORMALITY - Continuous slow, generalized  IMPRESSION: This study is suggestive of moderate diffuse encephalopathy. No seizures or epileptiform  discharges were seen throughout the recording.  Priyanka O Yadav    Scheduled Meds:    apixaban   5 mg Oral BID   arformoterol   15 mcg Nebulization BID   And   umeclidinium bromide   1 puff Inhalation Daily   ezetimibe   10 mg Oral QHS   hydroxychloroquine   200 mg Oral Daily   insulin  aspart  0-6 Units Subcutaneous TID WC   lidocaine   1 patch Transdermal Q24H   nadolol   40 mg Oral Daily   rosuvastatin   10 mg Oral Daily   tadalafil   20 mg Oral BID    Continuous Infusions:     LOS: 3 days     Aubrey Blas, MD,  FACP, [facility], [facility], [facility]   [facility] [place]      To contact the attending provider between 7A-7P or the covering provider during after hours 7P-7A, please log into the web site [email or web address] and access using universal  password for that web site. If you do not have the password, please call the hospital operator.  08/28/2023, 1:26 PM

## 2023-08-28 NOTE — Progress Notes (Signed)
 LTM EEG discontinued - no skin breakdown at [facility].

## 2023-08-28 NOTE — Plan of Care (Signed)
  Problem: Education: Goal: Knowledge of disease or condition will improve Outcome: Progressing Goal: Knowledge of secondary prevention will improve (MUST DOCUMENT ALL) Outcome: Progressing Goal: Knowledge of patient specific risk factors will improve (DELETE if not current risk factor) Outcome: Progressing   Problem: Ischemic Stroke/TIA Tissue Perfusion: Goal: Complications of ischemic stroke/TIA will be minimized Outcome: Progressing   Problem: Clinical Measurements: Goal: Ability to maintain clinical measurements within normal limits will improve Outcome: Progressing Goal: Will remain free from infection Outcome: Progressing Goal: Diagnostic test results will improve Outcome: Progressing Goal: Respiratory complications will improve Outcome: Progressing Goal: Cardiovascular complication will be avoided Outcome: Progressing   Problem: Skin Integrity: Goal: Risk for impaired skin integrity will decrease Outcome: Progressing   Problem: Nutritional: Goal: Maintenance of adequate nutrition will improve Outcome: Progressing Goal: Progress toward achieving an optimal weight will improve Outcome: Progressing   Problem: Safety: Goal: Ability to remain free from injury will improve Outcome: Progressing   Problem: Pain Managment: Goal: General experience of comfort will improve and/or be controlled Outcome: Progressing

## 2023-08-28 NOTE — Procedures (Addendum)
 Patient Name: Whitney [name]  MRN: [id number]  Epilepsy Attending: Arleene Lack  Referring Physician/Provider: Eleni Griffin, MD  Duration: 08/28/2023 0353 to 08/28/2023 1142   Patient history: 68 y.o. female with a history of Sjogren syndrome, diabetes, fibromyalgia, depression, GERD, aortic valve replacement, A-fib on Eliquis , TIA, NASH cirrhosis, pulmonary hypertension, thrombocytopenia and home O2 use and recent dog bite who presented with altered mental status.  EEG to evaluate for seizure   Level of alertness: Awake, asleep   AEDs during EEG study: None   Technical aspects: This EEG study was done with scalp electrodes positioned according to the 10-20 International system of electrode placement. Electrical activity was reviewed with band pass filter of 1-70Hz , sensitivity of 7 uV/mm, display speed of 6mm/sec with a 60Hz  notched filter applied as appropriate. EEG data were recorded continuously and digitally stored.  Video monitoring was available and reviewed as appropriate.   Description: The posterior dominant rhythm consists of 7.5 Hz activity of moderate voltage (25-35 uV) seen predominantly in posterior head regions, symmetric and reactive to eye opening and eye closing. Sleep was characterized by vertex waves, maximal frontocentral region. EEG also showed intermittent generalized 3-6Hz  theta-delta slowing. Hyperventilation and photic stimulation were not performed.     ABNORMALITY - Intermittent slow, generalized  IMPRESSION: This study was suggestive of mild diffuse encephalopathy. No seizures or epileptiform discharges were seen throughout the recording.  Joyce Leckey O Claudius Mich

## 2023-08-28 NOTE — TOC Initial Note (Signed)
 Transition of Care [facility]) - Initial/Assessment Note    Patient Details  Name: Whitney [name] MRN: [id number] Date of Birth: 04-10-1956  Transition of Care [facility]) CM/SW Contact:    Jonathan Neighbor, RN Phone Number: 08/28/2023, 12:47 PM  Clinical Narrative:                  Pt is from home with her son that has health issues. She does assist him some with medications/ transportation. Son  is not able to provide any physical assistance to pt. Pt is oxygen  dependent on 3L [place] through [employer]. Pt manages her own medications and denies any issues. Daughter concerned about her getting around at home. CM has asked PT to re-evaluate.  TOC following.  Expected Discharge Plan: Home w Home Health Services     Patient Goals and CMS Choice   CMS Medicare.gov Compare Post Acute Care list provided to:: Patient Choice offered to / list presented to : Patient      Expected Discharge Plan and Services   Discharge Planning Services: CM Consult Post Acute Care Choice: Home Health Living arrangements for the past 2 months: Single Family Home                           HH Arranged: OT, PT, Social Work, Nurse's Aide HH Agency: [facility] Date [facility] Agency Contacted: 08/28/23   Representative spoke with at [facility] Agency: Randel Buss  Prior Living Arrangements/Services Living arrangements for the past 2 months: Single Family Home Lives with:: Adult Children Patient language and need for interpreter reviewed:: Yes Do you feel safe going back to the place where you live?: Yes        Care giver support system in place?: No (comment) Current home services: DME (cane/ walker/ shower seat/oxygen ) Criminal Activity/Legal Involvement Pertinent to Current Situation/Hospitalization: No - Comment as needed  Activities of Daily Living      Permission Sought/Granted                  Emotional Assessment Appearance:: Appears stated age Attitude/Demeanor/Rapport: Engaged Affect  (typically observed): Accepting Orientation: : Oriented to Self, Oriented to  Time, Oriented to Situation, Oriented to Place   Psych Involvement: No (comment)  Admission diagnosis:  Acute encephalopathy [G93.40] Altered mental status, unspecified altered mental status type [R41.82] Patient Active Problem List   Diagnosis Date Noted   Altered mental status 08/26/2023   Acute encephalopathy 08/25/2023   Generalized weakness 08/25/2023   Dog bite 08/25/2023   Moderate persistent asthma 08/25/2023   Acute diastolic heart failure (HCC) 04/07/2023   Peripheral edema 04/07/2023   Acute hypoxemic respiratory failure (HCC) 03/15/2023   Chronic hypoxic respiratory failure (HCC) 03/14/2023   DM2 (diabetes mellitus, type 2) (HCC) 03/14/2023   Elevated troponin 03/14/2023   COPD with acute exacerbation (HCC) 03/12/2023   Acute respiratory failure (HCC) 03/12/2023   Elevated diaphragm 03/12/2023   History of TIA (transient ischemic attack) 03/12/2023   Hypercoagulable state due to paroxysmal atrial fibrillation (HCC) 05/08/2022   Cerebrovascular accident (CVA) (HCC) 04/13/2022   COPD (chronic obstructive pulmonary disease) (HCC) 01/18/2021   Pulmonary hypertension, unspecified (HCC) 06/15/2019   Dyspnea 01/06/2019   Paroxysmal atrial fibrillation (HCC) 01/24/2018   S/P AVR 12/05/2017   Thrombocytopenia (HCC) 11/13/2017   Other neutropenia (HCC) 11/13/2017   Numbness in feet 10/14/2017   Severe aortic stenosis    Inguinal hernia 10/22/2016   Obesity (BMI 30.0-34.9) 04/26/2016  Lower extremity edema 06/22/2015   Status post bariatric surgery 03/29/2014   Aortic valve disorder 04/21/2013   Anasarca 04/21/2013   Depression    OSA (obstructive sleep apnea)    Sjogren's syndrome (HCC)    Liver cirrhosis secondary to NASH (HCC)    HLD (hyperlipidemia)    Fibromyalgia    Postmenopausal bleeding 12/11/2012   Endometrial polyp 12/11/2012   NASH (nonalcoholic steatohepatitis) 08/06/2012    Chronic cough 04/11/2011   PCP:  Jimmey Mould, MD Pharmacy:   [facility] [phone] - [place], [place] - [address] AT [facility] [address] [place] [address] Phone: [phone] Fax: [phone]  [place] [facility] [address] [address] Phone: [phone] Fax: [phone]  [facility] [place] [address] [place] [address] Phone: [phone] Fax: [phone]     Social Drivers of Health (SDOH) Social History: SDOH Screenings   Food Insecurity: No Food Insecurity (08/27/2023)  Housing: Unknown (08/27/2023)  Transportation Needs: No Transportation Needs (08/27/2023)  Utilities: Not At Risk (08/27/2023)  Depression (PHQ2-9): Low Risk  (09/20/2020)  Recent Concern: Depression (PHQ2-9) - Medium Risk (07/11/2020)  Social Connections: Unknown (08/27/2023)  Tobacco Use: Low Risk  (08/25/2023)   SDOH Interventions:     Readmission Risk Interventions     No data to display

## 2023-08-28 NOTE — Plan of Care (Signed)

## 2023-08-29 DIAGNOSIS — G9340 Encephalopathy, unspecified: Secondary | ICD-10-CM | POA: Diagnosis not present

## 2023-08-29 DIAGNOSIS — D696 Thrombocytopenia, unspecified: Secondary | ICD-10-CM | POA: Diagnosis not present

## 2023-08-29 DIAGNOSIS — G4733 Obstructive sleep apnea (adult) (pediatric): Secondary | ICD-10-CM | POA: Diagnosis not present

## 2023-08-29 LAB — COMPREHENSIVE METABOLIC PANEL WITH GFR
ALT: 7 U/L (ref 0–44)
AST: 18 U/L (ref 15–41)
Albumin: 2.8 g/dL — ABNORMAL LOW (ref 3.5–5.0)
Alkaline Phosphatase: 42 U/L (ref 38–126)
Anion gap: 10 (ref 5–15)
BUN: 39 mg/dL — ABNORMAL HIGH (ref 8–23)
CO2: 25 mmol/L (ref 22–32)
Calcium: 9.3 mg/dL (ref 8.9–10.3)
Chloride: 107 mmol/L (ref 98–111)
Creatinine, Ser: 1.15 mg/dL — ABNORMAL HIGH (ref 0.44–1.00)
GFR, Estimated: 51 mL/min — ABNORMAL LOW (ref >60)
Glucose, Bld: 104 mg/dL — ABNORMAL HIGH (ref 70–99)
Potassium: 4.9 mmol/L (ref 3.5–5.1)
Sodium: 142 mmol/L (ref 135–145)
Total Bilirubin: 1.1 mg/dL (ref 0.0–1.2)
Total Protein: 5.8 g/dL — ABNORMAL LOW (ref 6.5–8.1)

## 2023-08-29 LAB — GLUCOSE, CAPILLARY
Glucose-Capillary: 89 mg/dL (ref 70–99)
Glucose-Capillary: 133 mg/dL — ABNORMAL HIGH (ref 70–99)
Glucose-Capillary: 99 mg/dL (ref 70–99)

## 2023-08-29 LAB — CBC
HCT: 40.2 % (ref 36.0–46.0)
Hemoglobin: 12.3 g/dL (ref 12.0–15.0)
MCH: 30 pg (ref 26.0–34.0)
MCHC: 30.6 g/dL (ref 30.0–36.0)
MCV: 98 fL (ref 80.0–100.0)
Platelets: 100 10*3/uL — ABNORMAL LOW (ref 150–400)
RBC: 4.1 MIL/uL (ref 3.87–5.11)
RDW: 16 % — ABNORMAL HIGH (ref 11.5–15.5)
WBC: 4.9 10*3/uL (ref 4.0–10.5)
nRBC: 0 % (ref 0.0–0.2)

## 2023-08-29 LAB — RAPID URINE DRUG SCREEN, HOSP PERFORMED
Amphetamines: NOT DETECTED
Barbiturates: NOT DETECTED
Benzodiazepines: NOT DETECTED
Cocaine: NOT DETECTED
Opiates: NOT DETECTED
Tetrahydrocannabinol: NOT DETECTED

## 2023-08-29 NOTE — Plan of Care (Signed)
  Problem: Coping: Goal: Ability to adjust to condition or change in health will improve Outcome: Progressing   Problem: Nutritional: Goal: Maintenance of adequate nutrition will improve Outcome: Progressing Goal: Progress toward achieving an optimal weight will improve Outcome: Progressing   Problem: Skin Integrity: Goal: Risk for impaired skin integrity will decrease Outcome: Progressing   Problem: Education: Goal: Knowledge of disease or condition will improve Outcome: Progressing Goal: Knowledge of secondary prevention will improve (MUST DOCUMENT ALL) Outcome: Progressing Goal: Knowledge of patient specific risk factors will improve (DELETE if not current risk factor) Outcome: Progressing   Problem: Self-Care: Goal: Ability to participate in self-care as condition permits will improve Outcome: Progressing Goal: Verbalization of feelings and concerns over difficulty with self-care will improve Outcome: Progressing Goal: Ability to communicate needs accurately will improve Outcome: Progressing   Problem: Clinical Measurements: Goal: Ability to maintain clinical measurements within normal limits will improve Outcome: Progressing Goal: Will remain free from infection Outcome: Progressing Goal: Diagnostic test results will improve Outcome: Progressing Goal: Respiratory complications will improve Outcome: Progressing Goal: Cardiovascular complication will be avoided Outcome: Progressing   Problem: Activity: Goal: Risk for activity intolerance will decrease Outcome: Progressing   Problem: Elimination: Goal: Will not experience complications related to bowel motility Outcome: Progressing Goal: Will not experience complications related to urinary retention Outcome: Progressing   Problem: Safety: Goal: Ability to remain free from injury will improve Outcome: Progressing   Problem: Skin Integrity: Goal: Risk for impaired skin integrity will decrease Outcome:  Progressing

## 2023-08-29 NOTE — Progress Notes (Signed)
 Occupational Therapy Treatment Patient Details Name: Whitney [name] MRN: [id number] DOB: [birth year] Today's Date: 08/29/2023   History of present illness Whitney [name] is a 68 y.o. female with medical history significant for paroxysmal atrial fibrillation currently anticoagulated on Eliquis , NASH cirrhosis, type 2 diabetes mellitus companied by diabetic peripheral polyneuropathy, hyperlipidemia, aortic stenosis status post aortic valve replacement in August 2019, Sosan syndrome, moderate persistent asthma, thrombocytopenia with baseline platelet count 40s to 90s, who is admitted to [facility] on 08/25/2023 with acute encephalopathy after presenting from home to [facility] ED for evaluation of altered mental status.   OT comments  Administered cognitive assessments to determine pt overall functioning for ind iADL management. Pt demonstrating impaired STM, processing, and comprehension skills and needs assist with financial management and medication management. Discussed need for assist with higher level iADLs with pt family and they discussed plans to prepare for that, pt seemed to be in agreeance upon conversation with her. OT to continue to progress pt as able, DC plans remain appropriate for [facility].       If plan is discharge home, recommend the following:  A little help with bathing/dressing/bathroom;Supervision due to cognitive status;Direct supervision/assist for financial management;Direct supervision/assist for medications management;Assist for transportation   Equipment Recommendations  None recommended by OT    Recommendations for Other Services      Precautions / Restrictions Precautions Precautions: Fall Recall of Precautions/Restrictions: Impaired Restrictions Weight Bearing Restrictions Per Provider Order: No       Mobility Bed Mobility               General bed mobility comments: Pt in recliner    Transfers                         Balance                                            ADL either performed or assessed with clinical judgement   ADL                                         General ADL Comments: Focused session on cog assessment, Discussed with pt family the need for supervision during ADLs/iADLs and mobility as well as assist with medication management and finances    Extremity/Trunk Assessment              Vision       Perception     Praxis     Communication Communication Communication: No apparent difficulties   Cognition Arousal: Alert Behavior During Therapy: WFL for tasks assessed/performed Cognition: Cognition impaired     Awareness: Intellectual awareness intact, Online awareness impaired       OT - Cognition Comments: A&Ox4. Administered medi cog assessment, pt scored 4/10 and noted to have STM deficits as well as trouble with reading comprehension of medication transfer log despite several cues for direction. pt noted to re-read the same questions she had recently finished, also had pt answer a few questions from SBT and SLUMS to further assessment cog. Pt demonstrating impaired/slow processing and trouble with mathematical calculations.                 Following commands: Intact  Cueing   Cueing Techniques: Verbal cues  Exercises      Shoulder Instructions       General Comments      Pertinent Vitals/ Pain       Pain Assessment Pain Assessment: No/denies pain  Home Living                                          Prior Functioning/Environment              Frequency  Min 2X/week        Progress Toward Goals  OT Goals(current goals can now be found in the care plan section)  Progress towards OT goals: Progressing toward goals  Acute Rehab OT Goals Patient Stated Goal: go home OT Goal Formulation: With patient Time For Goal Achievement: 09/10/23 Potential to Achieve Goals: Good  Plan       Co-evaluation                 AM-PAC OT "6 Clicks" Daily Activity     Outcome Measure   Help from another person eating meals?: None Help from another person taking care of personal grooming?: A Little Help from another person toileting, which includes using toliet, bedpan, or urinal?: A Little Help from another person bathing (including washing, rinsing, drying)?: A Little Help from another person to put on and taking off regular upper body clothing?: A Little Help from another person to put on and taking off regular lower body clothing?: A Little 6 Click Score: 19    End of Session    OT Visit Diagnosis: Unsteadiness on feet (R26.81);Other abnormalities of gait and mobility (R26.89);Other symptoms and signs involving cognitive function   Activity Tolerance Patient tolerated treatment well   Patient Left in chair;with call bell/phone within reach;with chair alarm set;with family/visitor present   Nurse Communication Mobility status        Time: 1610-9604 OT Time Calculation (min): 32 min  Charges: OT General Charges $OT Visit: 1 Visit OT Treatments $Therapeutic Activity: 8-22 mins $Cognitive Funtion inital: Initial 15 mins  08/29/2023  AB, OTR/L  Acute Rehabilitation Services  Office: [phone]   Whitney [name] 08/29/2023, 1:28 PM

## 2023-08-29 NOTE — Progress Notes (Addendum)
 PROGRESS NOTE   Whitney [name]  XBJ:[id number]    DOB: [birth year]    DOA: 08/25/2023  PCP: Jimmey Mould, MD   I have briefly reviewed patients previous medical records in [facility]   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  She was bit on the left forearm on 4/15 by her brother's dog (immunized), seen in the ED and discharged home on Augmentin ?  Amoxicillin , unclear compliance per family, subsequently developed somnolence, generalized weakness, intermittent slurred speech, seen in ED on 4/19 at which time evaluation including CT head, UA felt to be unremarkable and was discharged home, brought back to the ED due to progressive worsening mental status changes.  Admitted for acute metabolic encephalopathy, unclear etiology, neurology consulted, slowly improving.   Assessment & Plan:  Acute metabolic encephalopathy Agree with neurology that the small stroke does not explain patient's altered mental status. LTM EEG EEG shows encephalopathic process but no seizures. Low index of suspicion for meningitis.  Had been started briefly on meningitic doses of IV antibiotics.  LP was deferred due to thrombocytopenia.  However within a couple of hours of initiation of antibiotics, mental status improved and meningitis was felt less likely given too fast for them to be effective even if she had meningitis, antibiotics then discontinued. CMP and CBC unremarkable.  VBG showed elevated pCO2 at 60 but normal pH indicated appropriate compensation.  Ammonia levels 23 > 29.  B12: 681.  A1c 5.1.  TSH  1.350.  Lactic negative.  Flu/RSV/SARS coronavirus 2 by RT-PCR all negative.  UA not indicative of UTI.  Blood alcohol level less than 10.  UDS ordered but not sent yet. Despite extensive above evaluation, etiology of her AMS not clear As discussed with patient's granddaughter/pediatrician at bedside, she has had similar AMS during prior hospitalization for respiratory issues. Per family, AMS 70-75% better.  Continue delirium precautions, reorientation, supportive care and mobilization-discussed with nursing. Continue to monitor for additional 24 hours while she works with therapies, prior to likely discharge home on 4/25.  Acute embolic stroke: MRI brain: Small acute posterior right frontal white matter infarct.  CTA head and neck: Negative large vessel occlusion. LDL 74, A1c 5.1 2D echo: LVEF >75%, hyperdynamic, grade 3 diastolic dysfunction.  Severely elevated PASP.  Severe TR negative bubble study/no evidence of intra-atrial shunt. Continue apixaban  anticoagulation-stroke felt to be due to missed dose/doses of Eliquis .  Compliance counseled Therapies recommend home health PT, OT and outpatient SLP but given her mental status changes she was not felt safe to DC home yesterday.  Mental status continues to gradually improve.  She plans to work with PT and steps today.  Patient's son available to accept her home but is concerned that she is still somewhat of a fall risk and tends to intermittently doze off.  Advised them that this could be related to noncompliance with CPAP for the last couple of weeks and she has not used her CPAP for the duration.  See discussion below  Dyslipidemia LDL 74, goal <70 Now on atorvastatin 10 Mg daily and Zetia   Recent dog bite - Per family, patient was bit by dog on 5/15 and was  treated outpatient with Augmentin  and as needed Percocet.   Site of dog bite on left forearm appears to have healed with scabbing.  No acute findings of cellulitis or abscess Is not  getting Augmentin ,?  Completed course or at this time see no further indication for same.  Monitor.   Paroxysmal A-fib Paroxysms of mild A-fib with RVR.  Remains on telemetry Continue nadolol  40 mg daily and apixaban  5 Mg twice daily.   NASH cirrhosis Chronic thrombocytopenia - MELD score of 13.   - LFTs normal, Platelet of 100, improved - Continue nadalol Stable   T2DM Diabetic neuropathy A1c 5.1.  Tightly controlled. Discontinue SSI and CBG checks.   Moderate persistent asthma - Continue home bronchodilators and as needed albuterol  nebs   OSA See discussion above.  Reportedly has not used her CPAP for 3 or more weeks due to missing/broken part.  She has approached her home care agency but has not yet had it fixed.  Despite an order for CPAP in the hospital, it does not appear that she has gotten it to use here-requested RN to alert RT for tonight Consulted TOC to assist with fixing her home CPAP machine.   Sjogren syndrome - Continue plaquenil    Pulmonary HTN - Continue tadalafil   Body mass index is 43.63 kg/m./Morbid obesity Complicates care. Outpatient follow-up.   DVT prophylaxis: SCDs Start: 08/25/23 1931     Code Status: Full Code:  Family Communication: Granddaughter (pediatrician) and son at bedside Disposition:  Pending further work with PT today, use of CPAP tonight, monitoring and improvement of her mental status, TOC to assist with fixing her home CPAP, likely DC home 4/25 which the patient and family prefer as well.     Consultants:   Neurology  Procedures:     Subjective:  Reports feeling better.  Was happy with nursing care yesterday.  No specific complaints reported.  Per family, mental status is 70-75% better.  They feel that she can benefit from additional days stay and PT in the hospital prior to discharging home.  Objective:   Vitals:   08/29/23 0032 08/29/23 0413 08/29/23 0910 08/29/23 1244  BP: 103/76 109/72 114/86 100/73  Pulse: 68 66  67 64  Resp: 20  18 17   Temp: 98 F (36.7 C) (!) 97.5 F (36.4 C) 98 F (36.7 C) 98.1 F (36.7 C)  TempSrc: Oral Axillary Oral Oral  SpO2: 98% 96% 97% 98%  Weight:  114.4 kg    Height:        General exam: Elderly female, moderately built and obese sitting up comfortably in reclining chair without distress. Respiratory system: Clear to auscultation.  No increased work of breathing. Cardiovascular system: S1 & S2 heard, RRR. No JVD, murmurs, rubs, gallops or clicks. No pedal edema.  Telemetry personally reviewed: Sinus rhythm-discontinued telemetry Gastrointestinal system: Abdomen is nondistended, soft and nontender. No organomegaly or masses felt. Normal bowel sounds heard. Central nervous system: Appears alert and oriented x 3. No focal neurological deficits. Extremities: Symmetric 5 x 5 power. Skin: Left forearm dog bite site healing well with scabbing and no acute findings Psychiatry: Judgement and insight improving and better compared to yesterday. Mood & affect pleasant and appropriate.    Data Reviewed:   I have personally reviewed following labs and imaging studies   CBC: Recent Labs  Lab 08/24/23 1154 08/24/23 1415 08/25/23 1735 08/25/23 1736 08/26/23 0406 08/27/23 0618 08/29/23 0922  WBC 4.6  --  5.5  --  4.9 5.5 4.9  NEUTROABS 2.5  --  3.0  --  2.6  --   --   HGB 11.8*   < > 11.6*   < > 12.5 12.4 12.3  HCT 37.5   < > 37.8   < > 40.4 40.2 40.2  MCV 96.4  --  100.0  --  99.5 97.8 98.0  PLT 90*  --  92*  --  80* 99* 100*   < > = values in this interval not displayed.    Basic Metabolic Panel: Recent Labs  Lab 08/24/23 1154 08/24/23 1415 08/25/23 1735 08/25/23 1736 08/25/23 2125 08/25/23 2132 08/26/23 0406 08/27/23 0618 08/29/23 0922  NA 137   < > 139 140  --  140 141 141 142  K 4.6   < > 4.9 4.8  --  4.8 5.1 4.6 4.9  CL 102  --  103 101  --   --  103 108 107  CO2 29  --  30  --   --   --  31 25 25   GLUCOSE 138*  --  143* 136*  --   --  95 115*  104*  BUN 32*  --  23 29*  --   --  22 28* 39*  CREATININE 1.03*  --  0.89 0.90  --   --  0.86 0.96 1.15*  CALCIUM  8.6*  --  8.6*  --   --   --  9.0 8.7* 9.3  MG  --   --   --   --  2.2  --  2.4  --   --    < > = values in this interval not displayed.    Liver Function Tests: Recent Labs  Lab 08/24/23 1154 08/25/23 1735 08/26/23 0406 08/27/23 0618 08/29/23 0922  AST 25 21 19 18 18   ALT 7 7 7 7 7   ALKPHOS 46 41 45 41 42  BILITOT 1.2 1.1 1.3* 1.2 1.1  PROT 5.9* 6.0* 6.5 5.9* 5.8*  ALBUMIN  2.8* 2.7* 3.0* 2.7* 2.8*    CBG: Recent Labs  Lab 08/28/23 2116 08/29/23 0610 08/29/23 1244  GLUCAP 135* 89 133*    Microbiology Studies:   Recent Results (from the past 240 hours)  Resp panel by RT-PCR (RSV, Flu A&B, Covid) Anterior Nasal Swab     Status: None   Collection Time: 08/25/23  6:23 PM   Specimen: Anterior Nasal Swab  Result Value Ref Range Status   SARS Coronavirus 2 by RT PCR NEGATIVE NEGATIVE Final   Influenza A by PCR NEGATIVE NEGATIVE Final   Influenza B by PCR NEGATIVE NEGATIVE Final    Comment: (NOTE) The Xpert Xpress SARS-CoV-2/FLU/RSV plus assay is intended as an aid in the diagnosis of influenza from Nasopharyngeal swab specimens and should not be used as a sole basis for treatment. Nasal washings and aspirates are unacceptable for Xpert Xpress SARS-CoV-2/FLU/RSV testing.  Fact Sheet for Patients: BloggerCourse.com  Fact Sheet for Healthcare Providers: SeriousBroker.it  This test is not yet approved or cleared by the [place]  FDA and has been authorized for detection and/or diagnosis of SARS-CoV-2 by FDA under an Emergency Use Authorization (EUA). This EUA will remain in effect (meaning this test can be used) for the duration of the COVID-19 declaration under Section 564(b)(1) of the Act, 21 U.S.C. section 360bbb-3(b)(1), unless the authorization is terminated or revoked.     Resp Syncytial  Virus by PCR NEGATIVE NEGATIVE Final    Comment: (NOTE) Fact Sheet for Patients: BloggerCourse.com  Fact Sheet for Healthcare Providers: SeriousBroker.it  This test is not yet approved or cleared by the [place]  FDA and has been authorized for detection and/or diagnosis of SARS-CoV-2 by FDA under an Emergency Use Authorization (EUA). This EUA will remain in effect (meaning this test can be used) for the duration of the COVID-19 declaration under Section 564(b)(1) of the Act, 21 U.S.C. section 360bbb-3(b)(1), unless the authorization is terminated or revoked.  Performed at [facility], [address], [place], [place] [address]   Culture, blood (Routine X 2) w Reflex to ID Panel     Status: None (Preliminary result)   Collection Time: 08/27/23  6:18 AM   Specimen: BLOOD  Result Value Ref Range Status   Specimen Description BLOOD BLOOD LEFT ARM  Final   Special Requests   Final    BOTTLES DRAWN AEROBIC AND ANAEROBIC Blood Culture adequate volume   Culture   Final    NO GROWTH 2 DAYS Performed at [facility], [address], [place], [place] [address]    Report Status PENDING  Incomplete  Culture, blood (Routine X 2) w Reflex to ID Panel     Status: None (Preliminary result)   Collection Time: 08/27/23  6:19 AM   Specimen: BLOOD  Result Value Ref Range Status   Specimen Description BLOOD BLOOD RIGHT HAND  Final   Special Requests   Final    BOTTLES DRAWN AEROBIC ONLY Blood Culture results may not be optimal due to an inadequate volume of blood received in culture bottles   Culture   Final    NO GROWTH 2 DAYS Performed at [facility], [address] [place], [place] [address]    Report Status PENDING  Incomplete    Radiology Studies:  No results found.   Scheduled Meds:    apixaban   5 mg Oral BID   arformoterol   15 mcg Nebulization BID   And   umeclidinium bromide   1 puff Inhalation Daily    ezetimibe   10 mg Oral QHS   hydroxychloroquine   200 mg Oral Daily   insulin  aspart  0-6 Units Subcutaneous TID WC   lidocaine   1 patch Transdermal Q24H   nadolol   40 mg Oral Daily   rosuvastatin   10 mg Oral Daily   tadalafil   20 mg Oral BID    Continuous Infusions:     LOS: 4 days     Aubrey Blas, MD,  FACP, [facility], [facility], [facility]   [facility] [place]      To contact the attending provider between 7A-7P or the covering provider during after hours 7P-7A, please log into the web site [email or web address] and access using universal [place] password for that web site. If you do not have the password, please call the hospital operator.  08/29/2023, 1:34 PM

## 2023-08-29 NOTE — TOC Progression Note (Signed)
 Transition of Care [facility]) - Progression Note    Patient Details  Name: Whitney [name] MRN: [id number] Date of Birth: May 27, 1955  [facility] CM/SW Contact  Jonathan Neighbor, RN Phone Number: 08/29/2023, 2:55 PM  Clinical Narrative:     Pt having issues with her CPAP. CM met with her and her CPAP is from [employer]. She states its the mask/ oxygen  connections. CM spoke with Mitch at [employer] and he is going to get a new mask sent to her home.  TOC following.   Expected Discharge Plan: Home w Home Health Services    Expected Discharge Plan and Services   Discharge Planning Services: CM Consult Post Acute Care Choice: Home Health Living arrangements for the past 2 months: Single Family Home                           HH Arranged: OT, PT, Social Work, Nurse's Aide HH Agency: [facility] Date [facility] Agency Contacted: 08/28/23   Representative spoke with at [facility] Agency: Randel Buss   Social Determinants of Health (SDOH) Interventions SDOH Screenings   Food Insecurity: No Food Insecurity (08/27/2023)  Housing: Unknown (08/27/2023)  Transportation Needs: No Transportation Needs (08/27/2023)  Utilities: Not At Risk (08/27/2023)  Depression (PHQ2-9): Low Risk  (09/20/2020)  Recent Concern: Depression (PHQ2-9) - Medium Risk (07/11/2020)  Social Connections: Unknown (08/27/2023)  Tobacco Use: Low Risk  (08/25/2023)    Readmission Risk Interventions     No data to display

## 2023-08-29 NOTE — Progress Notes (Addendum)
 Physical Therapy Treatment Patient Details Name: Whitney [name] MRN: [id number] DOB: 25-Sep-1955 Today's Date: 08/29/2023   History of Present Illness Whitney [name] is a 68 y.o. female with medical history significant for paroxysmal atrial fibrillation currently anticoagulated on Eliquis , NASH cirrhosis, type 2 diabetes mellitus companied by diabetic peripheral polyneuropathy, hyperlipidemia, aortic stenosis status post aortic valve replacement in August 2019, Sosan syndrome, moderate persistent asthma, thrombocytopenia with baseline platelet count 40s to 90s, who is admitted to [facility] on 08/25/2023 with acute encephalopathy after presenting from home to [facility] ED for evaluation of altered mental status.    PT Comments  Pt with fair tolerance to treatment today. Pt was able to ambulate short distance in hallway with RW CGA and a chair follow before fatiguing quickly. Pt was able to navigate 2 steps with CGA before requiring ride in chair back to room. Pt is still limited by cognitive deficits. No change in DC/DME recs at this time. PT will continue to follow.     If plan is discharge home, recommend the following: A little help with walking and/or transfers;A little help with bathing/dressing/bathroom;Assistance with cooking/housework;Direct supervision/assist for medications management;Assist for transportation;Supervision due to cognitive status;Help with stairs or ramp for entrance   Can travel by private vehicle        Equipment Recommendations  Rolling walker (2 wheels)    Recommendations for Other Services       Precautions / Restrictions Precautions Precautions: Fall Recall of Precautions/Restrictions: Impaired Restrictions Weight Bearing Restrictions Per Provider Order: No     Mobility  Bed Mobility Overal bed mobility: Needs Assistance Bed Mobility: Supine to Sit     Supine to sit: Supervision, HOB elevated, Used rails     General bed mobility comments: increased  time and cues for sequencing.    Transfers Overall transfer level: Needs assistance Equipment used: Rolling walker (2 wheels) Transfers: Sit to/from Stand Sit to Stand: Contact guard assist           General transfer comment: Multiple cues for hand placement. Pt reliant on momentum to stand up.    Ambulation/Gait Ambulation/Gait assistance: Contact guard assist, +2 safety/equipment (Chair follow) Gait Distance (Feet): 40 Feet Assistive device: Rolling walker (2 wheels) Gait Pattern/deviations: Trunk flexed, Drifts right/left, Wide base of support, Decreased stride length, Step-through pattern Gait velocity: decreased     General Gait Details: no LOB noted. Pt required cues for navigating obstacles especially on the L side. Fatigued quickly requiring chair follow.   Stairs Stairs: Yes Stairs assistance: Contact guard assist Stair Management: Two rails, Alternating pattern, Forwards Number of Stairs: 2 General stair comments: no LOB noted.   Wheelchair Mobility     Tilt Bed    Modified Rankin (Stroke Patients Only)       Balance Overall balance assessment: Needs assistance Sitting-balance support: No upper extremity supported, Feet supported Sitting balance-Leahy Scale: Good     Standing balance support: Bilateral upper extremity supported, During functional activity Standing balance-Leahy Scale: Poor Standing balance comment: reliant on external support                            Communication Communication Communication: No apparent difficulties  Cognition Arousal: Alert Behavior During Therapy: WFL for tasks assessed/performed                             Following commands: Intact  Cueing Cueing Techniques: Verbal cues  Exercises      General Comments General comments (skin integrity, edema, etc.): VSS on 3L      Pertinent Vitals/Pain Pain Assessment Pain Assessment: No/denies pain    Home Living                           Prior Function            PT Goals (current goals can now be found in the care plan section) Progress towards PT goals: Progressing toward goals    Frequency    Min 3X/week      PT Plan      Co-evaluation              AM-PAC PT "6 Clicks" Mobility   Outcome Measure  Help needed turning from your back to your side while in a flat bed without using bedrails?: A Little Help needed moving from lying on your back to sitting on the side of a flat bed without using bedrails?: A Little Help needed moving to and from a bed to a chair (including a wheelchair)?: A Little Help needed standing up from a chair using your arms (e.g., wheelchair or bedside chair)?: A Little Help needed to walk in hospital room?: A Little Help needed climbing 3-5 steps with a railing? : A Lot 6 Click Score: 17    End of Session Equipment Utilized During Treatment: Gait belt;Oxygen  Activity Tolerance: Patient tolerated treatment well Patient left: with family/visitor present;in chair;with call bell/phone within reach;with chair alarm set Nurse Communication: Mobility status PT Visit Diagnosis: Other abnormalities of gait and mobility (R26.89)     Time: 6045-4098 PT Time Calculation (min) (ACUTE ONLY): 20 min  Charges:    $Gait Training: 8-22 mins PT General Charges $$ ACUTE PT VISIT: 1 Visit                     Rodgers Clack, PT, DPT Acute Rehab Services [phone]    Whitney [name] 08/29/2023, 4:02 PM

## 2023-08-30 DIAGNOSIS — G9340 Encephalopathy, unspecified: Secondary | ICD-10-CM | POA: Diagnosis not present

## 2023-08-30 LAB — BASIC METABOLIC PANEL WITH GFR
Anion gap: 8 (ref 5–15)
BUN: 42 mg/dL — ABNORMAL HIGH (ref 8–23)
CO2: 26 mmol/L (ref 22–32)
Calcium: 9.2 mg/dL (ref 8.9–10.3)
Chloride: 107 mmol/L (ref 98–111)
Creatinine, Ser: 1.22 mg/dL — ABNORMAL HIGH (ref 0.44–1.00)
GFR, Estimated: 48 mL/min — ABNORMAL LOW (ref >60)
Glucose, Bld: 100 mg/dL — ABNORMAL HIGH (ref 70–99)
Potassium: 4.8 mmol/L (ref 3.5–5.1)
Sodium: 141 mmol/L (ref 135–145)

## 2023-08-30 LAB — CBC
HCT: 41 % (ref 36.0–46.0)
Hemoglobin: 12.5 g/dL (ref 12.0–15.0)
MCH: 30 pg (ref 26.0–34.0)
MCHC: 30.5 g/dL (ref 30.0–36.0)
MCV: 98.3 fL (ref 80.0–100.0)
Platelets: 92 10*3/uL — ABNORMAL LOW (ref 150–400)
RBC: 4.17 MIL/uL (ref 3.87–5.11)
RDW: 16 % — ABNORMAL HIGH (ref 11.5–15.5)
WBC: 4.7 10*3/uL (ref 4.0–10.5)
nRBC: 0 % (ref 0.0–0.2)

## 2023-08-30 NOTE — Progress Notes (Signed)
 PROGRESS NOTE   Whitney [name]  ZOX:[id number]    DOB: 1955-11-09    DOA: 08/25/2023  PCP: Jimmey Mould, MD   Brief Hospital Course:  Prior documentation as per Dr. Sherrod Dolphin: "68 year old female, lives with her brother, has a Youth worker which she does not use, ambulates independently, drives, medical history significant for PAF on Eliquis , NASH cirrhosis, type II DM with peripheral neuropathy, HLD, aortic stenosis s/p aortic valve replacement in August 2019, moderate persistent asthma, thrombocytopenia, Sjogren syndrome, presented to the ED on 08/25/2023 with subacute mental status changes/confusion.  She was bit on the left forearm on 4/15 by her brother's dog (immunized), seen in the ED and discharged home on Augmentin ?  Amoxicillin , unclear compliance per family, subsequently developed somnolence, generalized weakness, intermittent slurred speech, seen in ED on 4/19 at which time evaluation including CT head, UA felt to be unremarkable and was discharged home, brought back to the ED due to progressive worsening mental status changes.  Admitted for acute metabolic encephalopathy, unclear etiology, neurology consulted, slowly improving".  08/30/2023: Patient seen alongside patient's son.  Acute encephalopathy seems to have improved significantly.  History from the patient and patient's son seem to suggest that the encephalopathy was secondary to noncompliance with CPAP.  I guess, patient became somnolent after noncompliance with CPAP.  Prior to discharge, patient will need CPAP in place.  Complicated with transition of care team.   Assessment & Plan:  Acute metabolic encephalopathy As per prior documentation: Agree with neurology that the small stroke does not explain patient's altered mental status. LTM EEG EEG shows encephalopathic process but no seizures. Low index of suspicion for meningitis.  Had been started briefly on meningitic doses of IV antibiotics.  LP was deferred due to  thrombocytopenia.  However within a couple of hours of initiation of antibiotics, mental status improved and meningitis was felt less likely given too fast for them to be effective even if she had meningitis, antibiotics then discontinued. CMP and CBC unremarkable.  VBG showed elevated pCO2 at 60 but normal pH indicated appropriate compensation.  Ammonia levels 23 > 29.  B12: 681.  A1c 5.1.  TSH 1.350.  Lactic negative.  Flu/RSV/SARS coronavirus 2 by RT-PCR all negative.  UA not indicative of UTI.  Blood alcohol level less than 10.  UDS ordered but not sent yet. Despite extensive above evaluation, etiology of her AMS not clear As discussed with patient's granddaughter/pediatrician at bedside, she has had similar AMS during prior hospitalization for respiratory issues. Per family, AMS 70-75% better.  Continue delirium precautions, reorientation, supportive care and mobilization-discussed with nursing. Continue to monitor for additional 24 hours while she works with therapies, prior to likely discharge home on 4/25. 08/30/2023: Discharge patient home when CPAP is in place.  Acute embolic stroke: MRI brain: Small acute posterior right frontal white matter infarct.  CTA head and neck: Negative large vessel occlusion. LDL 74, A1c 5.1 2D echo: LVEF >75%, hyperdynamic, grade 3 diastolic dysfunction.  Severely elevated PASP.  Severe TR negative bubble study/no evidence of intra-atrial shunt. Continue apixaban  anticoagulation-stroke felt to be due to missed dose/doses of Eliquis .  Compliance counseled -Home health PT/OT/speech and discharge.  Dyslipidemia LDL 74, goal <70 Now on atorvastatin 10 Mg daily and Zetia   Recent dog bite - Per family, patient was bit by dog on 5/15 and was treated outpatient with Augmentin  and as needed Percocet.   Site of dog bite on left forearm appears to have healed with scabbing.  No acute findings  of cellulitis or abscess Is not getting Augmentin ,?  Completed course or at  this time see no further indication for same.  Monitor.   Paroxysmal A-fib Paroxysms of mild A-fib with RVR.  Remains on telemetry Continue nadolol  40 mg daily and apixaban  5 Mg twice daily.   NASH cirrhosis Chronic thrombocytopenia - MELD score of 13.   - LFTs normal, Platelet of 100, improved - Continue nadalol Stable   T2DM Diabetic neuropathy A1c 5.1.  Tightly controlled. Discontinue SSI and CBG checks.   Moderate persistent asthma - Continue home bronchodilators and as needed albuterol  nebs   OSA See discussion above.  Reportedly has not used her CPAP for 3 or more weeks due to missing/broken part.  She has approached her home care agency but has not yet had it fixed.  Despite an order for CPAP in the hospital, it does not appear that she has gotten it to use here-requested RN to alert RT for tonight Consulted TOC to assist with fixing her home CPAP machine.   Sjogren syndrome - Continue plaquenil    Pulmonary HTN - Continue tadalafil   Body mass index is 41.15 kg/m./Morbid obesity Complicates care. Outpatient follow-up.   DVT prophylaxis: SCDs Start: 08/25/23 1931     Code Status: Full Code:  Family Communication: Son.   Disposition:  Inpatient for now.  Discharge patient home once CPAP can be arranged.     Consultants:   Neurology  Procedures:     Subjective:  No new complaints.  Objective:   Vitals:   08/30/23 0839 08/30/23 0840 08/30/23 1321 08/30/23 1623  BP:   103/74 109/78  Pulse:   62 61  Resp:   17 17  Temp:   98 F (36.7 C) 97.9 F (36.6 C)  TempSrc:   Oral Oral  SpO2: 90% 90% 99% 97%  Weight:      Height:        General exam: Awake and alert.  Morbidly obese.   Respiratory system: Clear to auscultation. Cardiovascular system: S1 & S2  Gastrointestinal system: Abdomen is obese and nontender.   Central nervous system: Awake and alert. Extremities:    Data Reviewed:   I have personally reviewed following labs and imaging  studies   CBC: Recent Labs  Lab 08/24/23 1154 08/24/23 1415 08/25/23 1735 08/25/23 1736 08/26/23 0406 08/27/23 0618 08/29/23 0922 08/30/23 0631  WBC 4.6  --  5.5  --  4.9 5.5 4.9 4.7  NEUTROABS 2.5  --  3.0  --  2.6  --   --   --   HGB 11.8*   < > 11.6*   < > 12.5 12.4 12.3 12.5  HCT 37.5   < > 37.8   < > 40.4 40.2 40.2 41.0  MCV 96.4  --  100.0  --  99.5 97.8 98.0 98.3  PLT 90*  --  92*  --  80* 99* 100* 92*   < > = values in this interval not displayed.    Basic Metabolic Panel: Recent Labs  Lab 08/25/23 1735 08/25/23 1736 08/25/23 2125 08/25/23 2132 08/26/23 0406 08/27/23 0618 08/29/23 0922 08/30/23 0631  NA 139 140  --  140 141 141 142 141  K 4.9 4.8  --  4.8 5.1 4.6 4.9 4.8  CL 103 101  --   --  103 108 107 107  CO2 30  --   --   --  31 25 25 26   GLUCOSE 143* 136*  --   --  95 115* 104* 100*  BUN 23 29*  --   --  22 28* 39* 42*  CREATININE 0.89 0.90  --   --  0.86 0.96 1.15* 1.22*  CALCIUM  8.6*  --   --   --  9.0 8.7* 9.3 9.2  MG  --   --  2.2  --  2.4  --   --   --     Liver Function Tests: Recent Labs  Lab 08/24/23 1154 08/25/23 1735 08/26/23 0406 08/27/23 0618 08/29/23 0922  AST 25 21 19 18 18   ALT 7 7 7 7 7   ALKPHOS 46 41 45 41 42  BILITOT 1.2 1.1 1.3* 1.2 1.1  PROT 5.9* 6.0* 6.5 5.9* 5.8*  ALBUMIN  2.8* 2.7* 3.0* 2.7* 2.8*    CBG: Recent Labs  Lab 08/29/23 0610 08/29/23 1244 08/29/23 1634  GLUCAP 89 133* 99    Microbiology Studies:   Recent Results (from the past 240 hours)  Resp panel by RT-PCR (RSV, Flu A&B, Covid) Anterior Nasal Swab     Status: None   Collection Time: 08/25/23  6:23 PM   Specimen: Anterior Nasal Swab  Result Value Ref Range Status   SARS Coronavirus 2 by RT PCR NEGATIVE NEGATIVE Final   Influenza A by PCR NEGATIVE NEGATIVE Final   Influenza B by PCR NEGATIVE NEGATIVE Final    Comment: (NOTE) The Xpert Xpress SARS-CoV-2/FLU/RSV plus assay is intended as an aid in the diagnosis of influenza from  Nasopharyngeal swab specimens and should not be used as a sole basis for treatment. Nasal washings and aspirates are unacceptable for Xpert Xpress SARS-CoV-2/FLU/RSV testing.  Fact Sheet for Patients: BloggerCourse.com  Fact Sheet for Healthcare Providers: SeriousBroker.it  This test is not yet approved or cleared by the [place]  FDA and has been authorized for detection and/or diagnosis of SARS-CoV-2 by FDA under an Emergency Use Authorization (EUA). This EUA will remain in effect (meaning this test can be used) for the duration of the COVID-19 declaration under Section 564(b)(1) of the Act, 21 U.S.C. section 360bbb-3(b)(1), unless the authorization is terminated or revoked.     Resp Syncytial Virus by PCR NEGATIVE NEGATIVE Final    Comment: (NOTE) Fact Sheet for Patients: BloggerCourse.com  Fact Sheet for Healthcare Providers: SeriousBroker.it  This test is not yet approved or cleared by the [place]  FDA and has been authorized for detection and/or diagnosis of SARS-CoV-2 by FDA under an Emergency Use Authorization (EUA). This EUA will remain in effect (meaning this test can be used) for the duration of the COVID-19 declaration under Section 564(b)(1) of the Act, 21 U.S.C. section 360bbb-3(b)(1), unless the authorization is terminated or revoked.  Performed at [facility], [address], [place], [place] [address]   Culture, blood (Routine X 2) w Reflex to ID Panel     Status: None (Preliminary result)   Collection Time: 08/27/23  6:18 AM   Specimen: BLOOD  Result Value Ref Range Status   Specimen Description BLOOD BLOOD LEFT ARM  Final   Special Requests   Final    BOTTLES DRAWN AEROBIC AND ANAEROBIC Blood Culture adequate volume   Culture   Final    NO GROWTH 3 DAYS Performed at [facility], [address], [place], [place] [address]     Report Status PENDING  Incomplete  Culture, blood (Routine X 2) w Reflex to ID Panel     Status: None (Preliminary result)   Collection Time: 08/27/23  6:19  AM   Specimen: BLOOD  Result Value Ref Range Status   Specimen Description BLOOD BLOOD RIGHT HAND  Final   Special Requests   Final    BOTTLES DRAWN AEROBIC ONLY Blood Culture results may not be optimal due to an inadequate volume of blood received in culture bottles   Culture   Final    NO GROWTH 3 DAYS Performed at [facility], [address] [place], [place] [address]    Report Status PENDING  Incomplete    Radiology Studies:  No results found.   Scheduled Meds:    apixaban   5 mg Oral BID   arformoterol   15 mcg Nebulization BID   And   umeclidinium bromide   1 puff Inhalation Daily   ezetimibe   10 mg Oral QHS   hydroxychloroquine   200 mg Oral Daily   lidocaine   1 patch Transdermal Q24H   nadolol   40 mg Oral Daily   rosuvastatin   10 mg Oral Daily   tadalafil   20 mg Oral BID    Continuous Infusions:     LOS: 5 days     Doroteo Gasmen, MD,    To contact the attending provider between 7A-7P or the covering provider during after hours 7P-7A, please log into the web site [email or web address] and access using universal [place] password for that web site. If you do not have the password, please call the hospital operator.  08/30/2023, 7:44 PM

## 2023-08-30 NOTE — Progress Notes (Signed)
 Patient denied pain this shift. She remained calm and cooperative. She slept well. She transferred to [facility] with contact guard assistance. No concerns this time.

## 2023-08-30 NOTE — Plan of Care (Signed)

## 2023-08-30 NOTE — TOC Progression Note (Addendum)
 Transition of Care [facility]) - Progression Note    Patient Details  Name: Whitney [name] MRN: [id number] Date of Birth: May 21, 1955  Transition of Care [facility] CM/SW Contact  Eusebio High, RN Phone Number: 08/30/2023, 9:59 AM  Clinical Narrative:     RNCM acknowledging "Consult to [facility] for HH/DME" Patient has HH arranged with [facility] and owns a RW. TOC will continue to follow patient for any additional discharge needs      Expected Discharge Plan: Home w Home Health Services    Expected Discharge Plan and Services   Discharge Planning Services: CM Consult Post Acute Care Choice: Home Health Living arrangements for the past 2 months: Single Family Home                           HH Arranged: OT, PT, Social Work, Nurse's Aide HH Agency: [facility] Date [facility] Agency Contacted: 08/28/23   Representative spoke with at [facility] Agency: Randel Buss   Social Determinants of Health (SDOH) Interventions SDOH Screenings   Food Insecurity: No Food Insecurity (08/27/2023)  Housing: Unknown (08/27/2023)  Transportation Needs: No Transportation Needs (08/27/2023)  Utilities: Not At Risk (08/27/2023)  Depression (PHQ2-9): Low Risk  (09/20/2020)  Recent Concern: Depression (PHQ2-9) - Medium Risk (07/11/2020)  Social Connections: Unknown (08/27/2023)  Tobacco Use: Low Risk  (08/25/2023)    Readmission Risk Interventions     No data to display

## 2023-08-30 NOTE — Plan of Care (Signed)
 Problem: Education: Goal: Ability to describe self-care measures that may prevent or decrease complications (Diabetes Survival Skills Education) will improve 08/30/2023 0606 by Guilford Leep, RN Outcome: Progressing 08/30/2023 0512 by Guilford Leep, RN Outcome: Progressing Goal: Individualized Educational Video(s) 08/30/2023 0606 by Guilford Leep, RN Outcome: Progressing 08/30/2023 0512 by Guilford Leep, RN Outcome: Progressing   Problem: Coping: Goal: Ability to adjust to condition or change in health will improve 08/30/2023 0606 by Guilford Leep, RN Outcome: Progressing 08/30/2023 0512 by Guilford Leep, RN Outcome: Progressing   Problem: Fluid Volume: Goal: Ability to maintain a balanced intake and output will improve 08/30/2023 0606 by Guilford Leep, RN Outcome: Progressing 08/30/2023 0512 by Guilford Leep, RN Outcome: Progressing   Problem: Health Behavior/Discharge Planning: Goal: Ability to identify and utilize available resources and services will improve 08/30/2023 0606 by Guilford Leep, RN Outcome: Progressing 08/30/2023 0512 by Guilford Leep, RN Outcome: Progressing Goal: Ability to manage health-related needs will improve 08/30/2023 0606 by Guilford Leep, RN Outcome: Progressing 08/30/2023 0512 by Guilford Leep, RN Outcome: Progressing   Problem: Metabolic: Goal: Ability to maintain appropriate glucose levels will improve 08/30/2023 0606 by Guilford Leep, RN Outcome: Progressing 08/30/2023 0512 by Guilford Leep, RN Outcome: Progressing   Problem: Nutritional: Goal: Maintenance of adequate nutrition will improve 08/30/2023 0606 by Guilford Leep, RN Outcome: Progressing 08/30/2023 0512 by Guilford Leep, RN Outcome: Progressing Goal: Progress toward achieving an optimal weight will improve 08/30/2023 0606 by Guilford Leep, RN Outcome: Progressing 08/30/2023 0512 by Guilford Leep, RN Outcome:  Progressing   Problem: Skin Integrity: Goal: Risk for impaired skin integrity will decrease 08/30/2023 0606 by Guilford Leep, RN Outcome: Progressing 08/30/2023 0512 by Guilford Leep, RN Outcome: Progressing   Problem: Tissue Perfusion: Goal: Adequacy of tissue perfusion will improve 08/30/2023 0606 by Guilford Leep, RN Outcome: Progressing 08/30/2023 0512 by Guilford Leep, RN Outcome: Progressing   Problem: Education: Goal: Knowledge of disease or condition will improve 08/30/2023 0606 by Guilford Leep, RN Outcome: Progressing 08/30/2023 0512 by Guilford Leep, RN Outcome: Progressing Goal: Knowledge of secondary prevention will improve (MUST DOCUMENT ALL) 08/30/2023 0606 by Guilford Leep, RN Outcome: Progressing 08/30/2023 0512 by Guilford Leep, RN Outcome: Progressing Goal: Knowledge of patient specific risk factors will improve (DELETE if not current risk factor) 08/30/2023 0606 by Guilford Leep, RN Outcome: Progressing 08/30/2023 0512 by Guilford Leep, RN Outcome: Progressing   Problem: Ischemic Stroke/TIA Tissue Perfusion: Goal: Complications of ischemic stroke/TIA will be minimized 08/30/2023 0606 by Guilford Leep, RN Outcome: Progressing 08/30/2023 0512 by Guilford Leep, RN Outcome: Progressing   Problem: Coping: Goal: Will verbalize positive feelings about self 08/30/2023 0606 by Guilford Leep, RN Outcome: Progressing 08/30/2023 0512 by Guilford Leep, RN Outcome: Progressing Goal: Will identify appropriate support needs 08/30/2023 0606 by Guilford Leep, RN Outcome: Progressing 08/30/2023 0512 by Guilford Leep, RN Outcome: Progressing   Problem: Health Behavior/Discharge Planning: Goal: Ability to manage health-related needs will improve 08/30/2023 0606 by Guilford Leep, RN Outcome: Progressing 08/30/2023 0512 by Guilford Leep, RN Outcome: Progressing Goal: Goals will be collaboratively established  with patient/family 08/30/2023 0606 by Guilford Leep, RN Outcome: Progressing 08/30/2023 0512 by Guilford Leep, RN Outcome: Progressing   Problem: Self-Care: Goal: Ability to participate in self-care as condition permits will improve 08/30/2023 0606 by Guilford Leep, RN Outcome: Progressing 08/30/2023  6045 by Guilford Leep, RN Outcome: Progressing Goal: Verbalization of feelings and concerns over difficulty with self-care will improve 08/30/2023 0606 by Guilford Leep, RN Outcome: Progressing 08/30/2023 0512 by Guilford Leep, RN Outcome: Progressing Goal: Ability to communicate needs accurately will improve 08/30/2023 0606 by Guilford Leep, RN Outcome: Progressing 08/30/2023 0512 by Guilford Leep, RN Outcome: Progressing   Problem: Nutrition: Goal: Risk of aspiration will decrease 08/30/2023 0606 by Guilford Leep, RN Outcome: Progressing 08/30/2023 0512 by Guilford Leep, RN Outcome: Progressing Goal: Dietary intake will improve 08/30/2023 0606 by Guilford Leep, RN Outcome: Progressing 08/30/2023 0512 by Guilford Leep, RN Outcome: Progressing   Problem: Education: Goal: Knowledge of General Education information will improve Description: Including pain rating scale, medication(s)/side effects and non-pharmacologic comfort measures 08/30/2023 0606 by Guilford Leep, RN Outcome: Progressing 08/30/2023 0512 by Guilford Leep, RN Outcome: Progressing   Problem: Health Behavior/Discharge Planning: Goal: Ability to manage health-related needs will improve 08/30/2023 0606 by Guilford Leep, RN Outcome: Progressing 08/30/2023 0512 by Guilford Leep, RN Outcome: Progressing   Problem: Clinical Measurements: Goal: Ability to maintain clinical measurements within normal limits will improve 08/30/2023 0606 by Guilford Leep, RN Outcome: Progressing 08/30/2023 0512 by Guilford Leep, RN Outcome: Progressing Goal: Will remain  free from infection 08/30/2023 0606 by Guilford Leep, RN Outcome: Progressing 08/30/2023 0512 by Guilford Leep, RN Outcome: Progressing Goal: Diagnostic test results will improve 08/30/2023 0606 by Guilford Leep, RN Outcome: Progressing 08/30/2023 0512 by Guilford Leep, RN Outcome: Progressing Goal: Respiratory complications will improve 08/30/2023 0606 by Guilford Leep, RN Outcome: Progressing 08/30/2023 0512 by Guilford Leep, RN Outcome: Progressing Goal: Cardiovascular complication will be avoided 08/30/2023 0606 by Guilford Leep, RN Outcome: Progressing 08/30/2023 0512 by Guilford Leep, RN Outcome: Progressing   Problem: Activity: Goal: Risk for activity intolerance will decrease 08/30/2023 0606 by Guilford Leep, RN Outcome: Progressing 08/30/2023 0512 by Guilford Leep, RN Outcome: Progressing   Problem: Nutrition: Goal: Adequate nutrition will be maintained 08/30/2023 0606 by Guilford Leep, RN Outcome: Progressing 08/30/2023 0512 by Guilford Leep, RN Outcome: Progressing   Problem: Coping: Goal: Level of anxiety will decrease 08/30/2023 0606 by Guilford Leep, RN Outcome: Progressing 08/30/2023 0512 by Guilford Leep, RN Outcome: Progressing   Problem: Elimination: Goal: Will not experience complications related to bowel motility 08/30/2023 0606 by Guilford Leep, RN Outcome: Progressing 08/30/2023 0512 by Guilford Leep, RN Outcome: Progressing Goal: Will not experience complications related to urinary retention 08/30/2023 0606 by Guilford Leep, RN Outcome: Progressing 08/30/2023 0512 by Guilford Leep, RN Outcome: Progressing   Problem: Pain Managment: Goal: General experience of comfort will improve and/or be controlled 08/30/2023 0606 by Guilford Leep, RN Outcome: Progressing 08/30/2023 0512 by Guilford Leep, RN Outcome: Progressing   Problem: Safety: Goal: Ability to remain free from injury  will improve 08/30/2023 0606 by Guilford Leep, RN Outcome: Progressing 08/30/2023 0512 by Guilford Leep, RN Outcome: Progressing   Problem: Skin Integrity: Goal: Risk for impaired skin integrity will decrease 08/30/2023 0606 by Guilford Leep, RN Outcome: Progressing 08/30/2023 0512 by Guilford Leep, RN Outcome: Progressing

## 2023-08-31 ENCOUNTER — Inpatient Hospital Stay (HOSPITAL_COMMUNITY)

## 2023-08-31 DIAGNOSIS — G9340 Encephalopathy, unspecified: Secondary | ICD-10-CM | POA: Diagnosis not present

## 2023-08-31 DIAGNOSIS — J9622 Acute and chronic respiratory failure with hypercapnia: Secondary | ICD-10-CM

## 2023-08-31 DIAGNOSIS — J9621 Acute and chronic respiratory failure with hypoxia: Secondary | ICD-10-CM

## 2023-08-31 DIAGNOSIS — G9341 Metabolic encephalopathy: Secondary | ICD-10-CM | POA: Diagnosis not present

## 2023-08-31 LAB — BLOOD GAS, VENOUS
Acid-Base Excess: 2.3 mmol/L — ABNORMAL HIGH (ref 0.0–2.0)
Acid-Base Excess: 1.4 mmol/L (ref 0.0–2.0)
Bicarbonate: 31.4 mmol/L — ABNORMAL HIGH (ref 20.0–28.0)
Bicarbonate: 30.9 mmol/L — ABNORMAL HIGH (ref 20.0–28.0)
Drawn by: 70571
Drawn by: 66579
O2 Saturation: 94.7 %
O2 Saturation: 93.5 %
Patient temperature: 36.7
Patient temperature: 35.9
pCO2, Ven: 69 mmHg — ABNORMAL HIGH (ref 44–60)
pCO2, Ven: 69 mmHg — ABNORMAL HIGH (ref 44–60)
pH, Ven: 7.26 (ref 7.25–7.43)
pH, Ven: 7.26 (ref 7.25–7.43)
pO2, Ven: 65 mmHg — ABNORMAL HIGH (ref 32–45)
pO2, Ven: 61 mmHg — ABNORMAL HIGH (ref 32–45)

## 2023-08-31 LAB — POCT I-STAT 7, (LYTES, BLD GAS, ICA,H+H)
Acid-Base Excess: 1 mmol/L (ref 0.0–2.0)
Bicarbonate: 30.1 mmol/L — ABNORMAL HIGH (ref 20.0–28.0)
Calcium, Ion: 1.37 mmol/L (ref 1.15–1.40)
HCT: 37 % (ref 36.0–46.0)
Hemoglobin: 12.6 g/dL (ref 12.0–15.0)
O2 Saturation: 88 %
Potassium: 5 mmol/L (ref 3.5–5.1)
Sodium: 143 mmol/L (ref 135–145)
TCO2: 32 mmol/L (ref 22–32)
pCO2 arterial: 66.3 mmHg (ref 32–48)
pH, Arterial: 7.265 — ABNORMAL LOW (ref 7.35–7.45)
pO2, Arterial: 64 mmHg — ABNORMAL LOW (ref 83–108)

## 2023-08-31 LAB — CBC
HCT: 41 % (ref 36.0–46.0)
Hemoglobin: 12.6 g/dL (ref 12.0–15.0)
MCH: 30.4 pg (ref 26.0–34.0)
MCHC: 30.7 g/dL (ref 30.0–36.0)
MCV: 99 fL (ref 80.0–100.0)
Platelets: 81 10*3/uL — ABNORMAL LOW (ref 150–400)
RBC: 4.14 MIL/uL (ref 3.87–5.11)
RDW: 16.4 % — ABNORMAL HIGH (ref 11.5–15.5)
WBC: 4.4 10*3/uL (ref 4.0–10.5)
nRBC: 0 % (ref 0.0–0.2)

## 2023-08-31 LAB — COMPREHENSIVE METABOLIC PANEL WITH GFR
ALT: 10 U/L (ref 0–44)
AST: 25 U/L (ref 15–41)
Albumin: 2.6 g/dL — ABNORMAL LOW (ref 3.5–5.0)
Alkaline Phosphatase: 44 U/L (ref 38–126)
Anion gap: 9 (ref 5–15)
BUN: 51 mg/dL — ABNORMAL HIGH (ref 8–23)
CO2: 26 mmol/L (ref 22–32)
Calcium: 9.4 mg/dL (ref 8.9–10.3)
Chloride: 108 mmol/L (ref 98–111)
Creatinine, Ser: 1.41 mg/dL — ABNORMAL HIGH (ref 0.44–1.00)
GFR, Estimated: 40 mL/min — ABNORMAL LOW (ref >60)
Glucose, Bld: 84 mg/dL (ref 70–99)
Potassium: 5 mmol/L (ref 3.5–5.1)
Sodium: 143 mmol/L (ref 135–145)
Total Bilirubin: 1.1 mg/dL (ref 0.0–1.2)
Total Protein: 5.7 g/dL — ABNORMAL LOW (ref 6.5–8.1)

## 2023-08-31 LAB — GLUCOSE, CAPILLARY
Glucose-Capillary: 83 mg/dL (ref 70–99)
Glucose-Capillary: 90 mg/dL (ref 70–99)

## 2023-08-31 LAB — BLOOD GAS, ARTERIAL
Acid-Base Excess: 1.2 mmol/L (ref 0.0–2.0)
Bicarbonate: 29.3 mmol/L — ABNORMAL HIGH (ref 20.0–28.0)
Drawn by: 59101
O2 Saturation: 97 %
Patient temperature: 37
pCO2 arterial: 61 mmHg — ABNORMAL HIGH (ref 32–48)
pH, Arterial: 7.29 — ABNORMAL LOW (ref 7.35–7.45)
pO2, Arterial: 73 mmHg — ABNORMAL LOW (ref 83–108)

## 2023-08-31 LAB — BRAIN NATRIURETIC PEPTIDE: B Natriuretic Peptide: 1322.9 pg/mL — ABNORMAL HIGH (ref 0.0–100.0)

## 2023-08-31 LAB — MRSA NEXT GEN BY PCR, NASAL: MRSA by PCR Next Gen: NOT DETECTED

## 2023-08-31 MED ORDER — DEXMEDETOMIDINE HCL IN NACL 400 MCG/100ML IV SOLN
0.0000 ug/kg/h | INTRAVENOUS | Status: DC
  Administered 2023-08-31: 0.4 ug/kg/h via INTRAVENOUS
  Administered 2023-09-02: 0.5 ug/kg/h via INTRAVENOUS
  Administered 2023-09-02: 0.4 ug/kg/h via INTRAVENOUS
  Administered 2023-09-03: 0.5 ug/kg/h via INTRAVENOUS
  Filled 2023-08-31 (×5): qty 100

## 2023-08-31 MED ORDER — CHLORHEXIDINE GLUCONATE CLOTH 2 % EX PADS
6.0000 | MEDICATED_PAD | Freq: Every day | CUTANEOUS | Status: DC
  Administered 2023-08-31 – 2023-09-14 (×13): 6 via TOPICAL

## 2023-08-31 NOTE — Plan of Care (Signed)
  Problem: Coping: Goal: Ability to adjust to condition or change in health will improve Outcome: Progressing   Problem: Health Behavior/Discharge Planning: Goal: Ability to identify and utilize available resources and services will improve Outcome: Not Progressing Goal: Ability to manage health-related needs will improve Outcome: Not Progressing

## 2023-08-31 NOTE — Progress Notes (Signed)
 Bipap was alarming and patient had pulled it off when staff arrived to room. Son Dorothea Gata just left about 30 minutes ago but he was just called and made aware that the patient is on stable enough to transfer to [facility] at this time but she will be moved to ICU for precedex . He was also asked to update the rest of the family.

## 2023-08-31 NOTE — Progress Notes (Signed)
 Pulmonologist at bedside addressing family questions and concerns regarding plan of care. New orders placed.

## 2023-08-31 NOTE — Progress Notes (Signed)
 Patients daughter called stating that she demands that the patient be moved to [facility] because the [facility] team has dropped the ball and the patient is going to die here and she is going to sue the hospital. The medical team has been made aware of concerns.

## 2023-08-31 NOTE — Progress Notes (Signed)
 RT note. ABG collected, sent to lab, lab notified. Patient placed on auto titrate CPAP 20/6 with 3 L bled in line. Patient sat 93%, RT will continue to monitor.    08/31/23 1038  BiPAP/CPAP/SIPAP  $ Non-Invasive Home Ventilator  Initial  $ Face Mask Medium Yes  BiPAP/CPAP/SIPAP Pt Type Adult  BiPAP/CPAP/SIPAP DREAMSTATIOND  Mask Type Full face mask  Dentures removed? No  Mask Size Medium  Respiratory Rate 24 breaths/min  EPAP  (auto titrate 20/6)  Flow Rate 3 lpm  Patient Home Machine No  Patient Home Mask No  Patient Home Tubing No  Auto Titrate Yes  Minimum cmH2O 20 cmH2O  Maximum cmH2O 6 cmH2O  BiPAP/CPAP /SiPAP Vitals  Pulse Rate 65  Resp (!) 24  SpO2 93 %  MEWS Score/Color  MEWS Score 1  MEWS Score Color Green

## 2023-08-31 NOTE — Progress Notes (Signed)
 PCCM note  Informed by the nurse that patient pulled her BiPAP mask off Will transfer her to the ICU and initiate Precedex  for BiPAP tolerance Close monitoring in the ICU for intubation needs  Shayda Kalka MD [facility] See Amion for pager  If no response to pager , please call [phone] until 7pm After 7:00 pm call Elink  [phone] 08/31/2023, 6:43 PM

## 2023-08-31 NOTE — Progress Notes (Signed)
 Pt transported from 3wW20 to 3M07 with no complications.

## 2023-08-31 NOTE — Plan of Care (Signed)
 Neurology plan of care  Please see excellent progress note from Dr. Sandria Cruise on 4/25 for summary of inpatient workup. She is ~75% of the way back to her mental status baseline and she is expected to be discharged today. We do not recommend any further inpatient neuro workup. I have placed an ambulatory referral for outpatient neurology f/u. She may benefit from some more in-depth cognitive testing at that time to evaluate for possible underlying cognitive impairment.  Neurology to sign off, but please re-engage if additional neurologic concerns arise.  Greg Leaks, MD [facility] [phone]  If 7pm- 7am, please page neurology on call as listed in AMION.

## 2023-08-31 NOTE — Progress Notes (Signed)
 NAME:  Whitney [name], MRN:  [id number], DOB:  1955/09/30, LOS: 6 ADMISSION DATE:  08/25/2023, CONSULTATION DATE:  08/31/2023 REFERRING MD:  Dr. Sandria Cruise, CHIEF COMPLAINT: Hypercapnic respiratory failure  History of Present Illness:  Whitney [name] is a 68 year old female with a past medical history significant for Nash cirrhosis, HTN, HLD, pulmonary hypertension, stroke end syndrome, thrombocytopenia in the setting of cirrhosis, type 2 diabetes and anemia who presented to the ED at [facility] 4/20 for complaints of altered mental status. Workup thus far has indicated that AMS is related to hypercapnic respiratory failure for whish PCCM has been called to assist in management .   Pertinent  Medical History  Nash cirrhosis, HTN, HLD, pulmonary hypertension, stroke end syndrome, thrombocytopenia in the setting of cirrhosis, type 2 diabetes and anemia  Significant Hospital Events: Including procedures, antibiotic start and stop dates in addition to other pertinent events   4/20 admitted with altered mental status being this blood gas with PCO2 69.4 4/26 PCCM consulted ABG with pH 7.29 and PCO261  Interim History / Subjective:  Seen sleeping in bed with BiPAP mask in place  Objective   Blood pressure (!) 87/59, pulse 65, temperature 98 F (36.7 C), temperature source Axillary, resp. rate 20, height 5' 3.75" (1.619 m), weight 108 kg, SpO2 100%.    FiO2 (%):  [40 %] 40 % PEEP:  [6 cmH20] 6 cmH20 Pressure Support:  [10 cmH20] 10 cmH20   Intake/Output Summary (Last 24 hours) at 08/31/2023 1443 Last data filed at 08/30/2023 2045 Gross per 24 hour  Intake 240 ml  Output --  Net 240 ml   Filed Weights   08/29/23 0413 08/30/23 0437 08/31/23 0418  Weight: 114.4 kg 107.9 kg 108 kg    Examination: General: Acute on chronic ill-appearing deconditioned elderly female lying in bed on BiPAP in no acute distress HEENT: ETT, MM pink/moist, PERRL,  Neuro: Will respond to painful stimuli, nonfocal CV:  s1s2 regular rate and rhythm, no murmur, rubs, or gallops,  PULM: Mechanical breath sounds with BiPAP in place, no increased work of breathing GI: soft, bowel sounds active in all 4 quadrants, non-tender, non-distended, tolerating diet Extremities: warm/dry, no edema  Skin: no rashes or lesions  Resolved Hospital Problem list     Assessment & Plan:  Acute on chronic hypoxic and hypercapnic respiratory failure with associated metabolic encephalopathy OSA -Venous and arterial blood gases continue to display hypercapnia.  Per son patient has not been utilizing home CPAP prior to admission due to broken equipment COPD Pulmonary hypertension - With underlying history of NASH concern for hepatic pulmonary shunting P: Continue BiPAP despite therapy patient continues to remain slightly hypercapnic, low threshold to move to ICU Aspiration precautions Intermittently trend ABG and or chest x-ray Consider low-dose enteral hydration while patient is BiPAP dependent given mild AKI Will place TOC consult to assist in obtaining home CPAP equipment versus NIV pending clinical course   Best Practice (right click and "Reselect all SmartList Selections" daily)  Per primary  Labs   CBC: Recent Labs  Lab 08/25/23 1735 08/25/23 1736 08/25/23 2132 08/26/23 0406 08/27/23 0618 08/29/23 0922 08/30/23 0631  WBC 5.5  --   --  4.9 5.5 4.9 4.7  NEUTROABS 3.0  --   --  2.6  --   --   --   HGB 11.6*   < > 13.6 12.5 12.4 12.3 12.5  HCT 37.8   < > 40.0 40.4 40.2 40.2 41.0  MCV 100.0  --   --  99.5 97.8 98.0 98.3  PLT 92*  --   --  80* 99* 100* 92*   < > = values in this interval not displayed.    Basic Metabolic Panel: Recent Labs  Lab 08/25/23 1735 08/25/23 1736 08/25/23 2125 08/25/23 2132 08/26/23 0406 08/27/23 0618 08/29/23 0922 08/30/23 0631  NA 139 140  --  140 141 141 142 141  K 4.9 4.8  --  4.8 5.1 4.6 4.9 4.8  CL 103 101  --   --  103 108 107 107  CO2 30  --   --   --  31 25 25 26    GLUCOSE 143* 136*  --   --  95 115* 104* 100*  BUN 23 29*  --   --  22 28* 39* 42*  CREATININE 0.89 0.90  --   --  0.86 0.96 1.15* 1.22*  CALCIUM  8.6*  --   --   --  9.0 8.7* 9.3 9.2  MG  --   --  2.2  --  2.4  --   --   --    GFR: Estimated Creatinine Clearance: 51.3 mL/min (A) (by C-G formula based on SCr of 1.22 mg/dL (H)). Recent Labs  Lab 08/25/23 1736 08/26/23 0406 08/27/23 0618 08/29/23 0922 08/30/23 0631  WBC  --  4.9 5.5 4.9 4.7  LATICACIDVEN 0.6  --   --   --   --     Liver Function Tests: Recent Labs  Lab 08/25/23 1735 08/26/23 0406 08/27/23 0618 08/29/23 0922  AST 21 19 18 18   ALT 7 7 7 7   ALKPHOS 41 45 41 42  BILITOT 1.1 1.3* 1.2 1.1  PROT 6.0* 6.5 5.9* 5.8*  ALBUMIN  2.7* 3.0* 2.7* 2.8*   No results for input(s): "LIPASE", "AMYLASE" in the last 168 hours. Recent Labs  Lab 08/25/23 2125 08/26/23 0406  AMMONIA 23 29    ABG    Component Value Date/Time   PHART 7.29 (L) 08/31/2023 1030   PCO2ART 61 (H) 08/31/2023 1030   PO2ART 73 (L) 08/31/2023 1030   HCO3 31.4 (H) 08/31/2023 1338   TCO2 33 (H) 08/25/2023 2132   ACIDBASEDEF 3.0 (H) 12/05/2017 1816   O2SAT 94.7 08/31/2023 1338     Coagulation Profile: Recent Labs  Lab 08/25/23 1735 08/26/23 0406  INR 1.8* 1.5*    Cardiac Enzymes: Recent Labs  Lab 08/26/23 0406  CKTOTAL 23*    HbA1C: Hgb A1c MFr Bld  Date/Time Value Ref Range Status  08/26/2023 04:06 AM 5.1 4.8 - 5.6 % Final    Comment:    (NOTE) Pre diabetes:          5.7%-6.4%  Diabetes:              >6.4%  Glycemic control for   <7.0% adults with diabetes   04/08/2023 05:22 AM 5.8 (H) 4.8 - 5.6 % Final    Comment:    (NOTE) Pre diabetes:          5.7%-6.4%  Diabetes:              >6.4%  Glycemic control for   <7.0% adults with diabetes     CBG: Recent Labs  Lab 08/28/23 1555 08/28/23 2116 08/29/23 0610 08/29/23 1244 08/29/23 1634  GLUCAP 142* 135* 89 133* 99    Review of Systems:   Unable to  assess  Past Medical History:  She,  has a past medical history of Allergic rhinitis, Anemia, Arthritis, Asthma, Cirrhosis (  HCC) (last albumin  3.3 done at [facility] 06-16-2014 (under care everywhere tab in epic)), Depression, Diabetes mellitus type II, Dyspnea, Fibromyalgia, GERD (gastroesophageal reflux disease), Heart murmur, History of exercise stress test, History of hiatal hernia, History of kidney stones, History of rheumatic fever, Hyperlipidemia, Leukocytopenia, Moderate aortic stenosis, NASH (nonalcoholic steatohepatitis), OSA (obstructive sleep apnea), Pneumonia, Pulmonary hypertension (HCC), Sjogren's syndrome (HCC), and Thrombocytopenia (HCC).   Surgical History:   Past Surgical History:  Procedure Laterality Date   AORTIC VALVE REPLACEMENT N/A 12/05/2017   Procedure: AORTIC VALVE REPLACEMENT (AVR) TISSUE VALVE INSPIRIS;  Surgeon: Bartley Lightning, MD;  Location: [facility] OR;  Service: Open Heart Surgery;  Laterality: N/A;   COLONOSCOPY WITH ESOPHAGOGASTRODUODENOSCOPY (EGD)     CYSTOSCOPY WITH RETROGRADE PYELOGRAM, URETEROSCOPY AND STENT PLACEMENT Left 01/21/2015   Procedure: CYSTOSCOPY WITH LEFT  RETROGRADE PYELOGRAM, LEFT URETEROSCOPY AND STENT PLACEMENT;  Surgeon: Christina Coyer, MD;  Location: [facility];  Service: Urology;  Laterality: Left;   CYSTOSCOPY WITH RETROGRADE PYELOGRAM, URETEROSCOPY AND STENT PLACEMENT Bilateral 02/24/2015   Procedure: CYSTOSCOPY WITH RIGHT RETROGRADE PYELOGRAM, BLADDER BIOPSY FULGERATION LEFT URETEROSCOPY AND STENT REPLACEMENT;  Surgeon: Christina Coyer, MD;  Location: [facility];  Service: Urology;  Laterality: Bilateral;   EXPLORATORY LAPAROSCOPY W/  CONE BIOPSY'S LEFT AND RIGHT LOBE OF LIVER  11-04-2007   HOLMIUM LASER APPLICATION Left 02/24/2015   Procedure: HOLMIUM LASER LITHOTRIPSY;  Surgeon: Christina Coyer, MD;  Location: [facility];  Service: Urology;  Laterality: Left;   HYSTEROSCOPY WITH D & C N/A  12/11/2012   Procedure: DILATATION AND CURETTAGE /HYSTEROSCOPY;  Surgeon: Lizette Righter. Wynona Hedger, MD;  Location: [facility];  Service: Gynecology;  Laterality: N/A;   INGUINAL HERNIA REPAIR Left 10/22/2016   Procedure: LEFT INGUINAL HERNIA REPAIR;  Surgeon: Enid Harry, MD;  Location: [facility] OR;  Service: General;  Laterality: Left;  TAP BLOCK   INSERTION OF MESH Left 10/22/2016   Procedure: INSERTION OF MESH;  Surgeon: Enid Harry, MD;  Location: [facility] OR;  Service: General;  Laterality: Left;   LAPAROSCOPIC GASTRIC SLEEVE RESECTION  07-27-2013   LOOP RECORDER INSERTION N/A 04/16/2022   Procedure: LOOP RECORDER INSERTION;  Surgeon: Verona Goodwill, MD;  Location: [facility] INVASIVE CV LAB;  Service: Cardiovascular;  Laterality: N/A;   PUBOVAGINAL SLING  04-10-2001   [facility]   RIGHT HEART CATH N/A 04/11/2023   Procedure: RIGHT HEART CATH;  Surgeon: Darlis Eisenmenger, MD;  Location: [facility] INVASIVE CV LAB;  Service: Cardiovascular;  Laterality: N/A;   RIGHT/LEFT HEART CATH AND CORONARY ANGIOGRAPHY N/A 08/23/2017   Procedure: RIGHT/LEFT HEART CATH AND CORONARY ANGIOGRAPHY;  Surgeon: Lucendia Rusk, MD;  Location: [facility] INVASIVE CV LAB;  Service: Cardiovascular;  Laterality: N/A;   TEE WITHOUT CARDIOVERSION N/A 12/05/2017   Procedure: TRANSESOPHAGEAL ECHOCARDIOGRAM (TEE);  Surgeon: Bartley Lightning, MD;  Location: [facility] OR;  Service: Open Heart Surgery;  Laterality: N/A;   TONSILLECTOMY  1975   TRANSTHORACIC ECHOCARDIOGRAM  06-04-2012  dr Jacquelynn Matter   mild LVH,  grade I diastolic dysfunction/  ef 60-65%/  moderate LAE/  mild MV calcifation without stenosis,  mild MR/  moderate AV stenosis,  cannot r/o bicupsid, area 1.1cm2/  mild dilated aortic root/  trivial TR     Social History:   reports that she has never smoked. She has been exposed to tobacco smoke. She has never used smokeless tobacco. She reports that she does not currently use alcohol. She reports that she does not use drugs.   Family  History:  Her family history  includes Allergies in her daughter; Alzheimer's disease in her father; Asthma in her daughter, father, and paternal grandmother; Heart attack in her father; Heart disease in her father and mother; Hip fracture in her father; Hypertension in her father; Rheum arthritis in her mother. There is no history of Stroke.   Allergies Allergies  Allergen Reactions   Prednisone      Other Reaction(s): delerium   Doxycycline Hives and Rash   Naproxen Rash and Hives     Home Medications  Prior to Admission medications   Medication Sig Start Date End Date Taking? Authorizing Provider  albuterol  (PROVENTIL ) (2.5 MG/3ML) 0.083% nebulizer solution Take 3 mLs (2.5 mg total) by nebulization every 6 (six) hours as needed for wheezing or shortness of breath. 03/12/23  Yes Cobb, Mariah Shines, NP  albuterol  (VENTOLIN  HFA) 108 (90 Base) MCG/ACT inhaler INHALE 2 PUFFS INTO THE LUNGS EVERY 4 HOURS AS NEEDED FOR WHEEZING OR SHORTNESS OF BREATH. 04/17/22  Yes Olalere, Adewale A, MD  amoxicillin -clavulanate (AUGMENTIN ) 875-125 MG tablet Take 1 tablet by mouth every 12 (twelve) hours. 08/20/23  Yes Rosealee Concha, MD  ELIQUIS  5 MG TABS tablet TAKE 1 TABLET BY MOUTH TWICE A DAY 06/27/23  Yes Fenton, Clint R, PA  Evolocumab  (REPATHA  SURECLICK) 140 MG/ML SOAJ Inject 140 mg into the skin every 14 (fourteen) days. 05/23/23  Yes Loyde Rule, MD  ezetimibe  (ZETIA ) 10 MG tablet Take 10 mg by mouth at bedtime.  09/17/16  Yes [provider]  FARXIGA  10 MG TABS tablet Take 1 tablet (10 mg total) by mouth daily before breakfast. 04/23/23  Yes Darlis Eisenmenger, MD  furosemide  (LASIX ) 20 MG tablet Take 2 tablets (40 mg total) by mouth daily. 04/23/23 07/04/24 Yes Darlis Eisenmenger, MD  gabapentin  (NEURONTIN ) 100 MG capsule Take 1 capsule (100 mg total) by mouth 2 (two) times daily at 10 am and 4 pm AND 3 capsules (300 mg total) at bedtime. 01/31/23  Yes McCue, Camilo Cella, NP  hydroxychloroquine  (PLAQUENIL ) 200 MG tablet Take 1 tablet  (200 mg total) by mouth daily. 07/17/23  Yes Rice, Haig Levan, MD  MAGNESIUM  GLUCONATE PO Take 1 tablet by mouth daily.   Yes [provider]  nadolol  (CORGARD ) 40 MG tablet TAKE 1 TABLET BY MOUTH EVERY DAY 07/29/23  Yes Nishan, Peter C, MD  oxyCODONE -acetaminophen  (PERCOCET/ROXICET) 5-325 MG tablet Take 1 tablet by mouth every 6 (six) hours as needed for severe pain (pain score 7-10). 08/20/23  Yes Rosealee Concha, MD  OXYGEN  Inhale 3 L into the lungs daily.   Yes [provider]  OZEMPIC, 1 MG/DOSE, 4 MG/3ML SOPN INJECT 1 MG SUBCUTANEOUSLY ONE TIME PER WEEK 84 DAYS 08/12/23  Yes [provider]  potassium chloride  SA (KLOR-CON  M) 20 MEQ tablet Take 1 tablet (20 mEq total) by mouth daily. 04/23/23  Yes Darlis Eisenmenger, MD  tadalafil , PAH, (ADCIRCA ) 20 MG tablet Take 2 tablets (40 mg total) by mouth daily. 07/12/23  Yes Darlis Eisenmenger, MD  Tiotropium Bromide-Olodaterol (STIOLTO RESPIMAT ) 2.5-2.5 MCG/ACT AERS INHALE 2 PUFFS INTO THE LUNGS DAILY. INHALE 2 PUFFS BY MOUTH INTO THE LUNGS DAILY 03/07/23  Yes Denson Flake, MD     Critical care time: NA  Shaketta Rill D. Harris, NP-C [facility] Personal contact information can be found on Amion  If no contact or response made please call [phone] 08/31/2023, 3:20 PM

## 2023-08-31 NOTE — Progress Notes (Signed)
 Per assessment patient has impaired cognition. She is slow to respond and responds inappropriately to some questions. Per report she refused to wear her cpap machine last night and this has been an ongoing concern. Son is at bedside and reports that her presentation today is a significant decrease from yesterday. Dr. Sandria Cruise notified and new orders were given. RN will update patient's son.

## 2023-08-31 NOTE — Progress Notes (Signed)
 Patient blood pressure soft at 87/59. Otherwise she is asymptomatic. Attending made aware. No change in care plan at this time.

## 2023-08-31 NOTE — Plan of Care (Signed)
  Problem: Education: Goal: Ability to describe self-care measures that may prevent or decrease complications (Diabetes Survival Skills Education) will improve Outcome: Progressing   Problem: Skin Integrity: Goal: Risk for impaired skin integrity will decrease Outcome: Progressing   Problem: Education: Goal: Knowledge of secondary prevention will improve (MUST DOCUMENT ALL) Outcome: Progressing   Problem: Education: Goal: Knowledge of patient specific risk factors will improve (DELETE if not current risk factor) Outcome: Progressing   Problem: Safety: Goal: Ability to remain free from injury will improve Outcome: Progressing

## 2023-08-31 NOTE — Progress Notes (Signed)
 RT note. Patient placed on servo air at this time based off patients abg. Patient on the following settings sat 98%. Patient is noted to be ripping bipap off face at this time. Son at bedside at this time. RT will continue to monitor.   08/31/23 1125  BiPAP/CPAP/SIPAP  $ Non-Invasive Ventilator  Non-Invasive Vent Set Up;Non-Invasive Vent Initial  BiPAP/CPAP/SIPAP Pt Type Adult  BiPAP/CPAP/SIPAP (S)  SERVO  Mask Type Full face mask  Mask Size Medium  Set Rate 14 breaths/min  Respiratory Rate 19 breaths/min  IPAP 16 cmH20  EPAP 6 cmH2O  Pressure Support 10 cmH20  PEEP 6 cmH20  FiO2 (%) 40 %  Minute Ventilation 6.4  Leak 31  Peak Inspiratory Pressure (PIP) 17  Tidal Volume (Vt) 694

## 2023-08-31 NOTE — Plan of Care (Signed)
  Problem: Education: Goal: Knowledge of secondary prevention will improve (MUST DOCUMENT ALL) 08/31/2023 0324 by Earvin Goldberg, RN Outcome: Progressing 08/31/2023 0323 by Earvin Goldberg, RN Outcome: Progressing   Problem: Education: Goal: Knowledge of patient specific risk factors will improve (DELETE if not current risk factor) 08/31/2023 0324 by Earvin Goldberg, RN Outcome: Progressing 08/31/2023 0323 by Earvin Goldberg, RN Outcome: Progressing   Problem: Ischemic Stroke/TIA Tissue Perfusion: Goal: Complications of ischemic stroke/TIA will be minimized 08/31/2023 0324 by Earvin Goldberg, RN Outcome: Progressing 08/31/2023 0323 by Earvin Goldberg, RN Outcome: Progressing

## 2023-08-31 NOTE — Progress Notes (Signed)
 eLink Physician-Brief Progress Note Patient Name: Whitney [name] DOB: 01-03-1956 MRN: [id number]   Date of Service  08/31/2023  HPI/Events of Note  69 year old female with a history of NASH cirrhosis, essential hypertension, dyslipidemia and pulmonary hypertension in setting of Sjogren's syndrome presents with altered mental status in the setting of hypercapnic respiratory failure.  To be secondary to noncompliance to her noninvasive ventilation.  Patient is hypothermic, saturating 100% on 60% FiO2.  Last gas shows persistent hypercapnia and hypoxia.  Studies show elevated creatinine.  Basilar atelectasis noted    eICU Interventions  Large audible leak and respiratory rate greater than set rate already.  Increase pressure support  Maintain scheduled SVNs, tadalafil , and Precedex   DVT prophylaxis with apixaban  GI prophylaxis not indicated   0111 - Update diet n.p.o. except meds     [name] 08/31/2023, 10:12 PM

## 2023-08-31 NOTE — Progress Notes (Signed)
 PROGRESS NOTE   Whitney [name]  ZOX:[id number]    DOB: Jun 09, 1955    DOA: 08/25/2023  PCP: Jimmey Mould, MD   Brief Hospital Course:  Prior documentation as per Dr. Sherrod Dolphin: "68 year old female, lives with her brother, has a Youth worker which she does not use, ambulates independently, drives, medical history significant for PAF on Eliquis , NASH cirrhosis, type II DM with peripheral neuropathy, HLD, aortic stenosis s/p aortic valve replacement in August 2019, moderate persistent asthma, thrombocytopenia, Sjogren syndrome, presented to the ED on 08/25/2023 with subacute mental status changes/confusion.  She was bit on the left forearm on 4/15 by her brother's dog (immunized), seen in the ED and discharged home on Augmentin ?  Amoxicillin , unclear compliance per family, subsequently developed somnolence, generalized weakness, intermittent slurred speech, seen in ED on 4/19 at which time evaluation including CT head, UA felt to be unremarkable and was discharged home, brought back to the ED due to progressive worsening mental status changes.  Admitted for acute metabolic encephalopathy, unclear etiology, neurology consulted, slowly improving".  08/30/2023: Patient seen alongside patient's son.  Acute encephalopathy seems to have improved significantly.  History from the patient and patient's son seem to suggest that the encephalopathy was secondary to noncompliance with CPAP.  I guess, patient became somnolent after noncompliance with CPAP.  Prior to discharge, patient will need CPAP in place.  Complicated with transition of care team.  08/31/2023: Patient did not wear CPAP last night as per collateral information.  Patient was noted to be somnolent today.  ABG done revealed pH of 7.29, PCO2 of 61 with pH of 73.  Patient is currently on BiPAP.  Will consult pulmonary team to advise if patient will really need BiPAP on discharge instead of CPAP.  Pulmonary team contacted.  Further management will depend on  above.   Assessment & Plan:  Acute metabolic encephalopathy As per prior documentation: Agree with neurology that the small stroke does not explain patient's altered mental status. LTM EEG EEG shows encephalopathic process but no seizures. Low index of suspicion for meningitis.  Had been started briefly on meningitic doses of IV antibiotics.  LP was deferred due to thrombocytopenia.  However within a couple of hours of initiation of antibiotics, mental status improved and meningitis was felt less likely given too fast for them to be effective even if she had meningitis, antibiotics then discontinued. CMP and CBC unremarkable.  VBG showed elevated pCO2 at 60 but normal pH indicated appropriate compensation.  Ammonia levels 23 > 29.  B12: 681.  A1c 5.1.  TSH 1.350.  Lactic negative.  Flu/RSV/SARS coronavirus 2 by RT-PCR all negative.  UA not indicative of UTI.  Blood alcohol level less than 10.  UDS ordered but not sent yet. Despite extensive above evaluation, etiology of her AMS not clear As discussed with patient's granddaughter/pediatrician at bedside, she has had similar AMS during prior hospitalization for respiratory issues. Per family, AMS 70-75% better.  Continue delirium precautions, reorientation, supportive care and mobilization-discussed with nursing. Continue to monitor for additional 24 hours while she works with therapies, prior to likely discharge home on 4/25. 08/30/2023: Discharge patient home when CPAP is in place. 08/31/2023: Worsening mentation with known usage of CPAP overnight.  Currently on BiPAP.  Pulmonary team consulted to advise if patient will need BiPAP on discharge.  ABG revealed pH of 7.29, PCO2 of 61 and pO2 of 73.  Will repeat ABG or VBG after 1 to 2 hours on BiPAP.  Acute embolic stroke: MRI  brain: Small acute posterior right frontal white matter infarct.  CTA head and neck: Negative large vessel occlusion. LDL 74, A1c 5.1 2D echo: LVEF >75%, hyperdynamic, grade 3  diastolic dysfunction.  Severely elevated PASP.  Severe TR negative bubble study/no evidence of intra-atrial shunt. Continue apixaban  anticoagulation-stroke felt to be due to missed dose/doses of Eliquis .  Compliance counseled -Home health PT/OT/speech and discharge.  Dyslipidemia LDL 74, goal <70 Now on atorvastatin 10 Mg daily and Zetia   Recent dog bite - Per family, patient was bit by dog on 5/15 and was treated outpatient with Augmentin  and as needed Percocet.   Site of dog bite on left forearm appears to have healed with scabbing.  No acute findings of cellulitis or abscess Is not getting Augmentin ,?  Completed course or at this time see no further indication for same.  Monitor.   Paroxysmal A-fib Paroxysms of mild A-fib with RVR.  Remains on telemetry Continue nadolol  40 mg daily and apixaban  5 Mg twice daily.   NASH cirrhosis Chronic thrombocytopenia - MELD score of 13.   - LFTs normal, Platelet of 100, improved - Continue nadalol Stable   T2DM Diabetic neuropathy A1c 5.1.  Tightly controlled. Discontinue SSI and CBG checks.   Moderate persistent asthma - Continue home bronchodilators and as needed albuterol  nebs   OSA See discussion above.  Reportedly has not used her CPAP for 3 or more weeks due to missing/broken part.  She has approached her home care agency but has not yet had it fixed.  Despite an order for CPAP in the hospital, it does not appear that she has gotten it to use here-requested RN to alert RT for tonight Consulted TOC to assist with fixing her home CPAP machine.  Possible obesity hypoventilation syndrome: - See above documentation.   Sjogren syndrome - Continue plaquenil    Pulmonary HTN - Continue tadalafil   Body mass index is 41.19 kg/m./Morbid obesity Complicates care. Outpatient follow-up.   DVT prophylaxis: SCDs Start: 08/25/23 1931     Code Status: Full Code:  Family Communication: Son.   Disposition:  Inpatient for now.  Discharge  patient home once CPAP can be arranged.     Consultants:   Neurology  Procedures:     Subjective:  No new complaints.  Objective:   Vitals:   08/31/23 0808 08/31/23 1038 08/31/23 1154 08/31/23 1331  BP: 116/78  (!) 87/59   Pulse: 67 65 65   Resp: 19 (!) 24 20   Temp: 98.9 F (37.2 C)  (!) 97.1 F (36.2 C) 98 F (36.7 C)  TempSrc: Oral  Axillary Axillary  SpO2: 97% 93% 100%   Weight:      Height:        General exam: Awake and alert.  Morbidly obese.   Respiratory system: Clear to auscultation. Cardiovascular system: S1 & S2  Gastrointestinal system: Abdomen is obese and nontender.   Central nervous system: Awake and alert. Extremities:    Data Reviewed:     CBC: Recent Labs  Lab 08/25/23 1735 08/25/23 1736 08/26/23 0406 08/27/23 0618 08/29/23 0922 08/30/23 0631  WBC 5.5  --  4.9 5.5 4.9 4.7  NEUTROABS 3.0  --  2.6  --   --   --   HGB 11.6*   < > 12.5 12.4 12.3 12.5  HCT 37.8   < > 40.4 40.2 40.2 41.0  MCV 100.0  --  99.5 97.8 98.0 98.3  PLT 92*  --  80* 99* 100* 92*   < > =  values in this interval not displayed.    Basic Metabolic Panel: Recent Labs  Lab 08/25/23 1735 08/25/23 1736 08/25/23 2125 08/25/23 2132 08/26/23 0406 08/27/23 0618 08/29/23 0922 08/30/23 0631  NA 139 140  --  140 141 141 142 141  K 4.9 4.8  --  4.8 5.1 4.6 4.9 4.8  CL 103 101  --   --  103 108 107 107  CO2 30  --   --   --  31 25 25 26   GLUCOSE 143* 136*  --   --  95 115* 104* 100*  BUN 23 29*  --   --  22 28* 39* 42*  CREATININE 0.89 0.90  --   --  0.86 0.96 1.15* 1.22*  CALCIUM  8.6*  --   --   --  9.0 8.7* 9.3 9.2  MG  --   --  2.2  --  2.4  --   --   --     Liver Function Tests: Recent Labs  Lab 08/25/23 1735 08/26/23 0406 08/27/23 0618 08/29/23 0922  AST 21 19 18 18   ALT 7 7 7 7   ALKPHOS 41 45 41 42  BILITOT 1.1 1.3* 1.2 1.1  PROT 6.0* 6.5 5.9* 5.8*  ALBUMIN  2.7* 3.0* 2.7* 2.8*    CBG: Recent Labs  Lab 08/29/23 0610 08/29/23 1244  08/29/23 1634  GLUCAP 89 133* 99    Microbiology Studies:   Recent Results (from the past 240 hours)  Resp panel by RT-PCR (RSV, Flu A&B, Covid) Anterior Nasal Swab     Status: None   Collection Time: 08/25/23  6:23 PM   Specimen: Anterior Nasal Swab  Result Value Ref Range Status   SARS Coronavirus 2 by RT PCR NEGATIVE NEGATIVE Final   Influenza A by PCR NEGATIVE NEGATIVE Final   Influenza B by PCR NEGATIVE NEGATIVE Final    Comment: (NOTE) The Xpert Xpress SARS-CoV-2/FLU/RSV plus assay is intended as an aid in the diagnosis of influenza from Nasopharyngeal swab specimens and should not be used as a sole basis for treatment. Nasal washings and aspirates are unacceptable for Xpert Xpress SARS-CoV-2/FLU/RSV testing.  Fact Sheet for Patients: BloggerCourse.com  Fact Sheet for Healthcare Providers: SeriousBroker.it  This test is not yet approved or cleared by the [place]  FDA and has been authorized for detection and/or diagnosis of SARS-CoV-2 by FDA under an Emergency Use Authorization (EUA). This EUA will remain in effect (meaning this test can be used) for the duration of the COVID-19 declaration under Section 564(b)(1) of the Act, 21 U.S.C. section 360bbb-3(b)(1), unless the authorization is terminated or revoked.     Resp Syncytial Virus by PCR NEGATIVE NEGATIVE Final    Comment: (NOTE) Fact Sheet for Patients: BloggerCourse.com  Fact Sheet for Healthcare Providers: SeriousBroker.it  This test is not yet approved or cleared by the [place]  FDA and has been authorized for detection and/or diagnosis of SARS-CoV-2 by FDA under an Emergency Use Authorization (EUA). This EUA will remain in effect (meaning this test can be used) for the duration of the COVID-19 declaration under Section 564(b)(1) of the Act, 21 U.S.C. section 360bbb-3(b)(1), unless the  authorization is terminated or revoked.  Performed at [facility], [address] [place], [place] [address]   Culture, blood (Routine X 2) w Reflex to ID Panel     Status: None (Preliminary result)   Collection Time: 08/27/23  6:18 AM   Specimen: BLOOD  Result Value Ref Range Status  Specimen Description BLOOD BLOOD LEFT ARM  Final   Special Requests   Final    BOTTLES DRAWN AEROBIC AND ANAEROBIC Blood Culture adequate volume   Culture   Final    NO GROWTH 4 DAYS Performed at [facility], [address], [place], [place] [address]    Report Status PENDING  Incomplete  Culture, blood (Routine X 2) w Reflex to ID Panel     Status: None (Preliminary result)   Collection Time: 08/27/23  6:19 AM   Specimen: BLOOD  Result Value Ref Range Status   Specimen Description BLOOD BLOOD RIGHT HAND  Final   Special Requests   Final    BOTTLES DRAWN AEROBIC ONLY Blood Culture results may not be optimal due to an inadequate volume of blood received in culture bottles   Culture   Final    NO GROWTH 4 DAYS Performed at [facility], [address] [place], [place] [address]    Report Status PENDING  Incomplete    Radiology Studies:  No results found.   Scheduled Meds:    apixaban   5 mg Oral BID   arformoterol   15 mcg Nebulization BID   And   umeclidinium bromide   1 puff Inhalation Daily   ezetimibe   10 mg Oral QHS   hydroxychloroquine   200 mg Oral Daily   lidocaine   1 patch Transdermal Q24H   nadolol   40 mg Oral Daily   rosuvastatin   10 mg Oral Daily   tadalafil   20 mg Oral BID    Continuous Infusions:     LOS: 6 days     Doroteo Gasmen, MD,    To contact the attending provider between 7A-7P or the covering provider during after hours 7P-7A, please log into the web site [email or web address] and access using universal [place] password for that web site. If you do not have the password, please call the hospital operator.  08/31/2023, 1:54 PM

## 2023-09-01 ENCOUNTER — Other Ambulatory Visit: Payer: Self-pay

## 2023-09-01 ENCOUNTER — Inpatient Hospital Stay (HOSPITAL_COMMUNITY)

## 2023-09-01 DIAGNOSIS — J9622 Acute and chronic respiratory failure with hypercapnia: Secondary | ICD-10-CM | POA: Diagnosis not present

## 2023-09-01 DIAGNOSIS — I5031 Acute diastolic (congestive) heart failure: Secondary | ICD-10-CM

## 2023-09-01 DIAGNOSIS — R4182 Altered mental status, unspecified: Secondary | ICD-10-CM | POA: Diagnosis not present

## 2023-09-01 DIAGNOSIS — J9602 Acute respiratory failure with hypercapnia: Secondary | ICD-10-CM | POA: Diagnosis not present

## 2023-09-01 DIAGNOSIS — I272 Pulmonary hypertension, unspecified: Secondary | ICD-10-CM

## 2023-09-01 DIAGNOSIS — J9621 Acute and chronic respiratory failure with hypoxia: Secondary | ICD-10-CM | POA: Diagnosis not present

## 2023-09-01 DIAGNOSIS — J449 Chronic obstructive pulmonary disease, unspecified: Secondary | ICD-10-CM

## 2023-09-01 DIAGNOSIS — J9601 Acute respiratory failure with hypoxia: Secondary | ICD-10-CM | POA: Diagnosis not present

## 2023-09-01 LAB — ECHOCARDIOGRAM LIMITED
Area-P 1/2: 1.94 cm2
Est EF: >75
Height: 63.75 in
S' Lateral: 2.1 cm
Weight: 3918.9 [oz_av]

## 2023-09-01 LAB — CULTURE, BLOOD (ROUTINE X 2)
Culture: NO GROWTH
Culture: NO GROWTH
Special Requests: ADEQUATE

## 2023-09-01 LAB — GLUCOSE, CAPILLARY
Glucose-Capillary: 66 mg/dL — ABNORMAL LOW (ref 70–99)
Glucose-Capillary: 69 mg/dL — ABNORMAL LOW (ref 70–99)
Glucose-Capillary: 80 mg/dL (ref 70–99)
Glucose-Capillary: 85 mg/dL (ref 70–99)
Glucose-Capillary: 85 mg/dL (ref 70–99)
Glucose-Capillary: 88 mg/dL (ref 70–99)
Glucose-Capillary: 95 mg/dL (ref 70–99)
Glucose-Capillary: 96 mg/dL (ref 70–99)

## 2023-09-01 LAB — LACTIC ACID, PLASMA: Lactic Acid, Venous: 1.8 mmol/L (ref 0.5–1.9)

## 2023-09-01 MED ORDER — FUROSEMIDE 10 MG/ML IJ SOLN
80.0000 mg | Freq: Once | INTRAMUSCULAR | Status: AC
  Administered 2023-09-01: 80 mg via INTRAVENOUS
  Filled 2023-09-01: qty 8

## 2023-09-01 MED ORDER — NOREPINEPHRINE 4 MG/250ML-% IV SOLN
2.0000 ug/min | INTRAVENOUS | Status: DC
Start: 2023-09-01 — End: 2023-09-02
  Administered 2023-09-01: 2 ug/min via INTRAVENOUS
  Administered 2023-09-02: 7 ug/min via INTRAVENOUS
  Filled 2023-09-01: qty 250

## 2023-09-01 MED ORDER — DEXTROSE 50 % IV SOLN: 12.5000 g | INTRAVENOUS | Status: AC

## 2023-09-01 MED ORDER — SODIUM CHLORIDE 0.9 % IV SOLN
250.0000 mL | INTRAVENOUS | Status: AC
  Administered 2023-09-02: 250 mL via INTRAVENOUS

## 2023-09-01 MED ORDER — NOREPINEPHRINE 4 MG/250ML-% IV SOLN
0.0000 ug/min | INTRAVENOUS | Status: DC
  Filled 2023-09-01: qty 250

## 2023-09-01 MED ORDER — ORAL CARE MOUTH RINSE: 15.0000 mL | OROMUCOSAL | Status: DC | PRN

## 2023-09-01 MED ORDER — DEXTROSE 50 % IV SOLN
INTRAVENOUS | Status: AC
  Administered 2023-09-01: 12.5 g via INTRAVENOUS
  Filled 2023-09-01: qty 50

## 2023-09-01 MED ORDER — DEXTROSE 50 % IV SOLN
12.5000 g | INTRAVENOUS | Status: AC
  Administered 2023-09-01: 12.5 g via INTRAVENOUS

## 2023-09-01 MED ORDER — BIOTENE DRY MOUTH MT LIQD: 15.0000 mL | OROMUCOSAL | Status: DC | PRN

## 2023-09-01 MED ORDER — ORAL CARE MOUTH RINSE
15.0000 mL | OROMUCOSAL | Status: DC
  Administered 2023-09-01 – 2023-09-03 (×9): 15 mL via OROMUCOSAL

## 2023-09-01 MED ORDER — DEXTROSE 50 % IV SOLN
INTRAVENOUS | Status: AC
  Filled 2023-09-01: qty 50

## 2023-09-01 NOTE — Progress Notes (Signed)
 Hypoglycemic Event  CBG: 69  Treatment: D50 25 mL (12.5 gm)  Symptoms: None  Follow-up CBG: Time:0418 CBG Result:85  Possible Reasons for Event: Inadequate meal intake  Comments/MD notified: Notified Elink MD    Fidela Hudson

## 2023-09-01 NOTE — Progress Notes (Signed)
 eLink Physician-Brief Progress Note Patient Name: Whitney [name] DOB: 03-Jan-1956 MRN: [id number]   Date of Service  09/01/2023  HPI/Events of Note  Pt requesting for a diet.   She has been on and off BIPAP.  She is awake and alert now, off BIPAP for meds.   MAP 75, RR 22, O2 sats 100%.   eICU Interventions  Ok to start on heart healthy diet with aspiration precautions.  Place on BIPAP with sleep. Recheck ABG in the AM.     Intervention Category Intermediate Interventions: Other:  Whitney [name] 09/01/2023, 10:37 PM

## 2023-09-01 NOTE — Consult Note (Signed)
 Advanced Heart Failure Team Consult Note   Primary Physician: Jimmey Mould, MD Cardiologist:  Janelle Mediate, MD  Reason for Consultation: Acute on chronic hypoxic respiratory failure  HPI:    Whitney [name] is a 68-year-old female with a complex past medical history of Sjogren syndrome, cirrhosis, rheumatic heart disease with bioprosthetic AVR, mitral valve disease, pulmonary hypertension is seen today for evaluation of acute on chronic hypoxic respiratory failure at the request of Dr. Marce Sensing.   Patient was previously seen and admitted with heart failure in 12/24.  After diuresis she underwent right heart catheterization that showed elevated right-sided pressures with moderate pulmonary arterial hypertension, PCWP was normal at the time.  Her PVR was 4.1 suggesting some element of PAH.  She had been recently started on tadalafil  as an outpatient.  She had been titrated to 40 mg daily.  Patient was admitted on 4/20 for altered mental status.  Workup consistent with acute on chronic hypercapnic respiratory failure and patient has been somewhat BiPAP dependent.  She was transferred to the ICU 4/26 for worsening ABG and increased lethargy.  Potential triggers include issues with her CPAP machine at home.  Also noted to have had significant weight gain since discharge after her last exacerbation.  Objective:    Vital Signs:   Temp:  [96.9 F (36.1 C)-97.5 F (36.4 C)] 97.4 F (36.3 C) (04/27 1100) Pulse Rate:  [48-64] 56 (04/27 1330) Resp:  [10-24] 20 (04/27 1330) BP: (72-131)/(52-74) 83/59 (04/27 1330) SpO2:  [91 %-100 %] 100 % (04/27 1330) FiO2 (%):  [40 %-60 %] 40 % (04/27 1249) Weight:  [111.1 kg] 111.1 kg (04/26 2124) Last BM Date : 08/29/23  Weight change: Filed Weights   08/30/23 0437 08/31/23 0418 08/31/23 2124  Weight: 107.9 kg 108 kg 111.1 kg    Intake/Output:   Intake/Output Summary (Last 24 hours) at 09/01/2023 1414 Last data filed at 09/01/2023 1000 Gross  per 24 hour  Intake 48.49 ml  Output --  Net 48.49 ml      Physical Exam    General: Chronically ill-appearing Cor: Regular rate and rhythm, systolic murmur, moderately elevated JVP, 1+ lower extremity edema Lungs: Mild bilateral rhonchi Abdomen: soft, nontender, nondistended. No hepatosplenomegaly. No bruits or masses. Good bowel sounds. Neuro: Alert and oriented to person only, improving mental status but still requiring redirection, no focal defects   Labs   Basic Metabolic Panel: Recent Labs  Lab 08/25/23 2125 08/25/23 2132 08/26/23 0406 08/27/23 0618 08/29/23 0922 08/30/23 0631 08/31/23 2115 08/31/23 2217  NA  --    < > 141 141 142 141 143 143  K  --    < > 5.1 4.6 4.9 4.8 5.0 5.0  CL  --   --  103 108 107 107  --  108  CO2  --   --  31 25 25 26   --  26  GLUCOSE  --   --  95 115* 104* 100*  --  84  BUN  --   --  22 28* 39* 42*  --  51*  CREATININE  --   --  0.86 0.96 1.15* 1.22*  --  1.41*  CALCIUM   --   --  9.0 8.7* 9.3 9.2  --  9.4  MG 2.2  --  2.4  --   --   --   --   --    < > = values in this interval not displayed.    Liver Function  Tests: Recent Labs  Lab 08/25/23 1735 08/26/23 0406 08/27/23 0618 08/29/23 0922 08/31/23 2217  AST 21 19 18 18 25   ALT 7 7 7 7 10   ALKPHOS 41 45 41 42 44  BILITOT 1.1 1.3* 1.2 1.1 1.1  PROT 6.0* 6.5 5.9* 5.8* 5.7*  ALBUMIN  2.7* 3.0* 2.7* 2.8* 2.6*   No results for input(s): "LIPASE", "AMYLASE" in the last 168 hours. Recent Labs  Lab 08/25/23 2125 08/26/23 0406  AMMONIA 23 29    CBC: Recent Labs  Lab 08/25/23 1735 08/25/23 1736 08/26/23 0406 08/27/23 0618 08/29/23 0922 08/30/23 0631 08/31/23 2115 08/31/23 2217  WBC 5.5  --  4.9 5.5 4.9 4.7  --  4.4  NEUTROABS 3.0  --  2.6  --   --   --   --   --   HGB 11.6*   < > 12.5 12.4 12.3 12.5 12.6 12.6  HCT 37.8   < > 40.4 40.2 40.2 41.0 37.0 41.0  MCV 100.0  --  99.5 97.8 98.0 98.3  --  99.0  PLT 92*  --  80* 99* 100* 92*  --  81*   < > = values in this  interval not displayed.    Cardiac Enzymes: Recent Labs  Lab 08/26/23 0406  CKTOTAL 23*    BNP: BNP (last 3 results) Recent Labs    04/07/23 1603 04/23/23 1059 08/31/23 2217  BNP 515.8* 616.0* 1,322.9*    ProBNP (last 3 results) Recent Labs    03/13/23 1428  PROBNP 996.0*     CBG: Recent Labs  Lab 08/31/23 2306 09/01/23 0352 09/01/23 0417 09/01/23 0753 09/01/23 1127  GLUCAP 90 69* 85 88 80    Coagulation Studies: No results for input(s): "LABPROT", "INR" in the last 72 hours.    Medications:     Current Medications:  apixaban   5 mg Oral BID   arformoterol   15 mcg Nebulization BID   And   umeclidinium bromide   1 puff Inhalation Daily   Chlorhexidine  Gluconate Cloth  6 each Topical Q0600   ezetimibe   10 mg Oral QHS   hydroxychloroquine   200 mg Oral Daily   lidocaine   1 patch Transdermal Q24H   nadolol   40 mg Oral Daily   mouth rinse  15 mL Mouth Rinse 4 times per day   rosuvastatin   10 mg Oral Daily   tadalafil   20 mg Oral BID    Infusions:  dexmedetomidine  (PRECEDEX ) IV infusion Stopped (09/01/23 0703)      Patient Profile   Whitney [name] is a 68-year-old female with a complex past medical history of Sjogren syndrome, cirrhosis, rheumatic heart disease with bioprosthetic AVR, mitral valve disease, pulmonary hypertension is seen today for evaluation of acute on chronic hypoxic respiratory failure at the request of Dr. Marce Sensing.   Assessment/Plan    Pulmonary hypertension Acute on chronic hypoxic and hypercarbic respiratory failure - RHC in 2024 most consistent with precapillary PH, started on tadalafil   - Done after diuresis, and suspect this underestimates her group II component, especially given mitral valve disease - Does have a large component of portopulmonary HTN, especially given high cardiac output - Hold tadalfil for now given significant volume overload, hypoxic respiratory failure - Agree with IV lasix  80mg  BID - Poor SGLT-2  candidate - K 5, consider MRA as diuresis is started - Will consider restarting tadalafil  prior to discharge - Continue positive pressure ventilation as needed  Valvular disease  - S/p bioprosthetic AVR, severe MAC - Gradients  mildly high for AVR, but otherwise functioning well  Cirrhosis - due to MAFLD - Continue nadolol  40mg  daily - Low dose crestor   pAF:  - Continue eliquis , NSR currently    Length of Stay: 7  Lauralee Poll, MD  09/01/2023, 2:14 PM    Advanced Heart Failure Team Pager [phone] (M-F; 7a - 5p)  Please contact [facility] for night-coverage after hours (4p -7a ) and weekends on [email or web address]

## 2023-09-01 NOTE — Progress Notes (Signed)
 NAME:  Whitney [name], MRN:  [id number], DOB:  Jun 03, 1955, LOS: 7 ADMISSION DATE:  08/25/2023, CONSULTATION DATE:  08/31/2023 REFERRING MD:  Dr. Sandria Cruise, CHIEF COMPLAINT: Hypercapnic respiratory failure  History of Present Illness:  Whitney [name] is a 68 year old female with a past medical history significant for Nash cirrhosis, HTN, HLD, pulmonary hypertension, stroke end syndrome, thrombocytopenia in the setting of cirrhosis, type 2 diabetes and anemia who presented to the ED at [facility] 4/20 for complaints of altered mental status. Workup thus far has indicated that AMS is related to hypercapnic respiratory failure for whish PCCM has been called to assist in management .   Pertinent  Medical History  Nash cirrhosis, HTN, HLD, pulmonary hypertension, stroke end syndrome, thrombocytopenia in the setting of cirrhosis, type 2 diabetes and anemia  Significant Hospital Events: Including procedures, antibiotic start and stop dates in addition to other pertinent events   4/20 admitted with altered mental status being this blood gas with PCO2 69.4 4/26 PCCM consulted ABG with pH 7.29 and PCO261  Interim History / Subjective:  Brought to ICU for increasing lethargy thought to be principally due to hypercarbia. Had been non-compliant even in hospital.  Family states that she has been increasingly confused over the past year. She has been noticeably confused over last 10 days - Apparently her CPAP machine stopped working.  Family denies any change in medications or sedative use.  Apparently patient has not been receiving diuretic since admission Neurology work up revealed small strokes.   Objective   Blood pressure 91/67, pulse (!) 58, temperature (!) 97.5 F (36.4 C), temperature source Axillary, resp. rate 17, height 5' 3.75" (1.619 m), weight 111.1 kg, SpO2 100%.    Vent Mode: BIPAP;PCV FiO2 (%):  [40 %-60 %] 50 % Set Rate:  [17 bmp-24 bmp] 17 bmp PEEP:  [5 cmH20-6 cmH20] 5 cmH20 Pressure  Support:  [10 cmH20-20 cmH20] 20 cmH20   Intake/Output Summary (Last 24 hours) at 09/01/2023 0957 Last data filed at 09/01/2023 0600 Gross per 24 hour  Intake 45.63 ml  Output --  Net 45.63 ml   Filed Weights   08/30/23 0437 08/31/23 0418 08/31/23 2124  Weight: 107.9 kg 108 kg 111.1 kg   Examination: General: Obese woman lying in bed. Somnolent.  HEENT: BiPAP mask with poor seal at lower lip.  Neuro: Will respond to voice and participated in conversation. No focal deficits.  CV: S1/S2 normal with no gallops.  3/6 CDC murmur with radiation to apex, 2/6 HSM with radiation to axilla. JVP to angle of the jaw.  PULM: Crackles at bases.  GI: abdomen soft and non-tender.  Extremities: + 4 pitting edema.  Skin: no rashes or lesions  Ancillary Tests Personally Reviewed:   pCO2 69 on venous gas.  Creatinine 1.41 BNP 1322 (higher than baseline)  Prior echo in 11/24 showed severe pulmonary hypertension and MV calcification. AV prosthesis ? Undersized.  Echo from 4/22 showed hyperdynamic LV with grade III DD and elevated LAP. Severe MAC, Bioprosthetic valve in place. Severe TR and severe PAH. IVC dilated.  WBC 4.4 Assessment & Plan:  Acute on chronic hypoxic and hypercapnic respiratory failure with associated metabolic encephalopathy OSA COPD Pulmonary hypertension Acute decompensated HFpEF, NASH cirrhosis.   Plan:  - Continue BiPAP as needed and at nighttime. Adjusted settings to limit leak. - Break off BiPAP today. PO for comfort. Doubt she will improve significantly until she's diuresed for a few days.  She might need a Cortrak for a  few days.  - Still taking marginal Vt likely due to decreased compliance from pulmonary edema. Should improve with diuresis.  - Diurese today, HF consulted. - Continuing baseline PAH therapy. - Continue current bronchodilators.  - Has gained weight and has not had a recent PSG. Should have a repeat sleep study as outpatient.  - Continue nadolol  for  varices prophylaxis. HR only 60, so may need dose reduction if appears to be affecting HF management.    Best Practice (right click and "Reselect all SmartList Selections" daily)  Per primary  CRITICAL CARE Performed by: Arlina Lair   Total critical care time: 50 minutes  Critical care time was exclusive of separately billable procedures and treating other patients.  Critical care was necessary to treat or prevent imminent or life-threatening deterioration.  Critical care was time spent personally by me on the following activities: development of treatment plan with patient and/or surrogate as well as nursing, discussions with consultants, evaluation of patient's response to treatment, examination of patient, obtaining history from patient or surrogate, ordering and performing treatments and interventions, ordering and review of laboratory studies, ordering and review of radiographic studies, pulse oximetry, re-evaluation of patient's condition and participation in multidisciplinary rounds.  Arlina Lair, MD [facility] ICU Physician [facility]  Pager: [phone] Mobile: [phone] After hours: [phone].

## 2023-09-01 NOTE — Progress Notes (Signed)
 RT took patient off bipap and placed patient on 3L Water[place] per MD. Patient tolerating well at this time. RT will continue to monitor as needed.

## 2023-09-01 NOTE — Progress Notes (Signed)
 Hayley RN notified via secure chat that PICC will not be placed today.

## 2023-09-02 ENCOUNTER — Inpatient Hospital Stay (HOSPITAL_COMMUNITY)

## 2023-09-02 DIAGNOSIS — J9601 Acute respiratory failure with hypoxia: Secondary | ICD-10-CM | POA: Diagnosis not present

## 2023-09-02 DIAGNOSIS — J9621 Acute and chronic respiratory failure with hypoxia: Secondary | ICD-10-CM | POA: Diagnosis not present

## 2023-09-02 DIAGNOSIS — J9602 Acute respiratory failure with hypercapnia: Secondary | ICD-10-CM | POA: Diagnosis not present

## 2023-09-02 DIAGNOSIS — I10 Essential (primary) hypertension: Secondary | ICD-10-CM | POA: Diagnosis not present

## 2023-09-02 DIAGNOSIS — G9340 Encephalopathy, unspecified: Secondary | ICD-10-CM | POA: Diagnosis not present

## 2023-09-02 DIAGNOSIS — E119 Type 2 diabetes mellitus without complications: Secondary | ICD-10-CM

## 2023-09-02 DIAGNOSIS — I272 Pulmonary hypertension, unspecified: Secondary | ICD-10-CM | POA: Diagnosis not present

## 2023-09-02 DIAGNOSIS — J9622 Acute and chronic respiratory failure with hypercapnia: Secondary | ICD-10-CM | POA: Diagnosis not present

## 2023-09-02 LAB — POCT I-STAT 7, (LYTES, BLD GAS, ICA,H+H)
Acid-Base Excess: 1 mmol/L (ref 0.0–2.0)
Acid-Base Excess: 1 mmol/L (ref 0.0–2.0)
Bicarbonate: 29.6 mmol/L — ABNORMAL HIGH (ref 20.0–28.0)
Bicarbonate: 27.6 mmol/L (ref 20.0–28.0)
Calcium, Ion: 1.33 mmol/L (ref 1.15–1.40)
Calcium, Ion: 1.23 mmol/L (ref 1.15–1.40)
HCT: 38 % (ref 36.0–46.0)
HCT: 37 % (ref 36.0–46.0)
Hemoglobin: 12.9 g/dL (ref 12.0–15.0)
Hemoglobin: 12.6 g/dL (ref 12.0–15.0)
O2 Saturation: 98 %
O2 Saturation: 96 %
Patient temperature: 98.2
Potassium: 5 mmol/L (ref 3.5–5.1)
Potassium: 4.7 mmol/L (ref 3.5–5.1)
Sodium: 141 mmol/L (ref 135–145)
Sodium: 139 mmol/L (ref 135–145)
TCO2: 32 mmol/L (ref 22–32)
TCO2: 29 mmol/L (ref 22–32)
pCO2 arterial: 65.5 mmHg (ref 32–48)
pCO2 arterial: 51.6 mmHg — ABNORMAL HIGH (ref 32–48)
pH, Arterial: 7.264 — ABNORMAL LOW (ref 7.35–7.45)
pH, Arterial: 7.336 — ABNORMAL LOW (ref 7.35–7.45)
pO2, Arterial: 130 mmHg — ABNORMAL HIGH (ref 83–108)
pO2, Arterial: 87 mmHg (ref 83–108)

## 2023-09-02 LAB — BASIC METABOLIC PANEL WITH GFR
Anion gap: 9 (ref 5–15)
Anion gap: 11 (ref 5–15)
BUN: 58 mg/dL — ABNORMAL HIGH (ref 8–23)
BUN: 60 mg/dL — ABNORMAL HIGH (ref 8–23)
CO2: 26 mmol/L (ref 22–32)
CO2: 23 mmol/L (ref 22–32)
Calcium: 9.2 mg/dL (ref 8.9–10.3)
Calcium: 8.7 mg/dL — ABNORMAL LOW (ref 8.9–10.3)
Chloride: 106 mmol/L (ref 98–111)
Chloride: 101 mmol/L (ref 98–111)
Creatinine, Ser: 2 mg/dL — ABNORMAL HIGH (ref 0.44–1.00)
Creatinine, Ser: 1.96 mg/dL — ABNORMAL HIGH (ref 0.44–1.00)
GFR, Estimated: 26 mL/min — ABNORMAL LOW (ref >60)
GFR, Estimated: 27 mL/min — ABNORMAL LOW (ref >60)
Glucose, Bld: 115 mg/dL — ABNORMAL HIGH (ref 70–99)
Glucose, Bld: 269 mg/dL — ABNORMAL HIGH (ref 70–99)
Potassium: 4.9 mmol/L (ref 3.5–5.1)
Potassium: 4.7 mmol/L (ref 3.5–5.1)
Sodium: 141 mmol/L (ref 135–145)
Sodium: 135 mmol/L (ref 135–145)

## 2023-09-02 LAB — GLUCOSE, CAPILLARY
Glucose-Capillary: 94 mg/dL (ref 70–99)
Glucose-Capillary: 114 mg/dL — ABNORMAL HIGH (ref 70–99)
Glucose-Capillary: 126 mg/dL — ABNORMAL HIGH (ref 70–99)
Glucose-Capillary: 117 mg/dL — ABNORMAL HIGH (ref 70–99)
Glucose-Capillary: 103 mg/dL — ABNORMAL HIGH (ref 70–99)
Glucose-Capillary: 114 mg/dL — ABNORMAL HIGH (ref 70–99)

## 2023-09-02 LAB — CBC
HCT: 39.8 % (ref 36.0–46.0)
Hemoglobin: 12.5 g/dL (ref 12.0–15.0)
MCH: 30.6 pg (ref 26.0–34.0)
MCHC: 31.4 g/dL (ref 30.0–36.0)
MCV: 97.3 fL (ref 80.0–100.0)
Platelets: 86 10*3/uL — ABNORMAL LOW (ref 150–400)
RBC: 4.09 MIL/uL (ref 3.87–5.11)
RDW: 15.9 % — ABNORMAL HIGH (ref 11.5–15.5)
WBC: 5.5 10*3/uL (ref 4.0–10.5)
nRBC: 0 % (ref 0.0–0.2)

## 2023-09-02 LAB — URINALYSIS, ROUTINE W REFLEX MICROSCOPIC
Bilirubin Urine: NEGATIVE
Glucose, UA: NEGATIVE mg/dL
Ketones, ur: NEGATIVE mg/dL
Nitrite: NEGATIVE
Protein, ur: NEGATIVE mg/dL
Specific Gravity, Urine: 1.006 (ref 1.005–1.030)
pH: 5 (ref 5.0–8.0)

## 2023-09-02 LAB — COOXEMETRY PANEL
Carboxyhemoglobin: 1.8 % — ABNORMAL HIGH (ref 0.5–1.5)
Methemoglobin: 1 % (ref 0.0–1.5)
O2 Saturation: 99 %
Total hemoglobin: 12.1 g/dL (ref 12.0–16.0)

## 2023-09-02 LAB — MAGNESIUM: Magnesium: 2.2 mg/dL (ref 1.7–2.4)

## 2023-09-02 LAB — BRAIN NATRIURETIC PEPTIDE: B Natriuretic Peptide: 1408.9 pg/mL — ABNORMAL HIGH (ref 0.0–100.0)

## 2023-09-02 LAB — SODIUM, URINE, RANDOM: Sodium, Ur: 104 mmol/L

## 2023-09-02 LAB — PROCALCITONIN: Procalcitonin: <0.1 ng/mL

## 2023-09-02 LAB — LACTIC ACID, PLASMA: Lactic Acid, Venous: 1.7 mmol/L (ref 0.5–1.9)

## 2023-09-02 MED ORDER — NADOLOL 40 MG PO TABS
40.0000 mg | ORAL_TABLET | ORAL | Status: DC
  Administered 2023-09-02 – 2023-09-04 (×2): 40 mg via ORAL
  Filled 2023-09-02 (×2): qty 1

## 2023-09-02 MED ORDER — FUROSEMIDE 10 MG/ML IJ SOLN
160.0000 mg | Freq: Once | INTRAVENOUS | Status: AC
  Administered 2023-09-02: 160 mg via INTRAVENOUS
  Filled 2023-09-02: qty 10

## 2023-09-02 MED ORDER — CEFTRIAXONE SODIUM 2 G IJ SOLR
2.0000 g | INTRAMUSCULAR | Status: DC
  Administered 2023-09-02 – 2023-09-03 (×2): 2 g via INTRAVENOUS
  Filled 2023-09-02 (×2): qty 20

## 2023-09-02 MED ORDER — DEXTROSE 5 % IV SOLN
30.0000 mg/h | INTRAVENOUS | Status: AC
  Administered 2023-09-02: 20 mg/h via INTRAVENOUS
  Administered 2023-09-02 – 2023-09-03 (×3): 30 mg/h via INTRAVENOUS
  Filled 2023-09-02 (×5): qty 20

## 2023-09-02 MED ORDER — VASOPRESSIN 20 UNITS/100 ML INFUSION FOR SHOCK
0.0000 [IU]/min | INTRAVENOUS | Status: DC
  Administered 2023-09-03 – 2023-09-07 (×9): 0.03 [IU]/min via INTRAVENOUS
  Filled 2023-09-02 (×10): qty 100

## 2023-09-02 MED ORDER — DEXTROSE 5 % IV SOLN
500.0000 mg | Freq: Once | INTRAVENOUS | Status: DC
  Filled 2023-09-02: qty 17.8

## 2023-09-02 MED ORDER — SENNA 8.6 MG PO TABS
1.0000 | ORAL_TABLET | Freq: Every day | ORAL | Status: DC
Start: 2023-09-02 — End: 2023-09-03
  Administered 2023-09-02: 8.6 mg via ORAL
  Filled 2023-09-02: qty 1

## 2023-09-02 MED ORDER — POLYETHYLENE GLYCOL 3350 17 G PO PACK
17.0000 g | PACK | Freq: Every day | ORAL | Status: DC
  Administered 2023-09-02 – 2023-09-07 (×5): 17 g via ORAL
  Filled 2023-09-02 (×5): qty 1

## 2023-09-02 MED ORDER — NOREPINEPHRINE 16 MG/250ML-% IV SOLN
0.0000 ug/min | INTRAVENOUS | Status: DC
  Administered 2023-09-02: 5 ug/min via INTRAVENOUS
  Administered 2023-09-03: 8 ug/min via INTRAVENOUS
  Filled 2023-09-02 (×3): qty 250

## 2023-09-02 MED ORDER — SODIUM CHLORIDE 0.9% FLUSH
10.0000 mL | Freq: Two times a day (BID) | INTRAVENOUS | Status: DC
  Administered 2023-09-02 (×2): 30 mL
  Administered 2023-09-03: 10 mL
  Administered 2023-09-03: 30 mL
  Administered 2023-09-04 – 2023-09-06 (×4): 10 mL
  Administered 2023-09-06: 20 mL
  Administered 2023-09-07: 15 mL
  Administered 2023-09-07: 20 mL
  Administered 2023-09-08 – 2023-09-10 (×3): 10 mL
  Administered 2023-09-10: 20 mL
  Administered 2023-09-11 (×2): 10 mL
  Administered 2023-09-12: 30 mL
  Administered 2023-09-12 – 2023-09-13 (×3): 10 mL

## 2023-09-02 MED ORDER — CHLOROTHIAZIDE SODIUM 500 MG IV SOLR
1000.0000 mg | Freq: Once | INTRAVENOUS | Status: AC
  Administered 2023-09-02: 1000 mg via INTRAVENOUS
  Filled 2023-09-02: qty 35.6

## 2023-09-02 MED ORDER — VASOPRESSIN 20 UNITS/100 ML INFUSION FOR SHOCK
INTRAVENOUS | Status: AC
  Administered 2023-09-02: 0.03 [IU]/min via INTRAVENOUS
  Filled 2023-09-02: qty 100

## 2023-09-02 MED ORDER — REVEFENACIN 175 MCG/3ML IN SOLN
175.0000 ug | Freq: Every day | RESPIRATORY_TRACT | Status: DC
  Administered 2023-09-03 – 2023-09-13 (×11): 175 ug via RESPIRATORY_TRACT
  Filled 2023-09-02 (×12): qty 3

## 2023-09-02 MED ORDER — SODIUM CHLORIDE 0.9% FLUSH: 10.0000 mL | INTRAVENOUS | Status: DC | PRN

## 2023-09-02 NOTE — Progress Notes (Signed)
 Carelink Summary Report / Loop Recorder

## 2023-09-02 NOTE — Progress Notes (Addendum)
 NAME:  Whitney [name], MRN:  [id number], DOB:  07-19-1955, LOS: 8 ADMISSION DATE:  08/25/2023, CONSULTATION DATE:  08/31/2023 REFERRING MD:  Dr. Sandria Cruise, CHIEF COMPLAINT: Hypercapnic respiratory failure  History of Present Illness:  Whitney [name] is a 68 year old female with a past medical history significant for Nash cirrhosis, HTN, HLD, pulmonary hypertension, stroke end syndrome, thrombocytopenia in the setting of cirrhosis, type 2 diabetes and anemia who presented to the ED at [facility] 4/20 for complaints of altered mental status. Workup thus far has indicated that AMS is related to hypercapnic respiratory failure for whish PCCM has been called to assist in management .   Pertinent  Medical History  Nash cirrhosis, HTN, HLD, pulmonary hypertension, stroke end syndrome, thrombocytopenia in the setting of cirrhosis, type 2 diabetes and anemia  Significant Hospital Events: Including procedures, antibiotic start and stop dates in addition to other pertinent events   4/20 admitted with altered mental status being this blood gas with PCO2 69.4 4/26 PCCM consulted ABG with pH 7.29 and PCO261, moved to the intensive care.  Placed on NIPPV had not received diuretics since admission 4/27 : Did have a hypoglycemic event requiring dextrose  replacement BNP was 1322 echo cardiogram review shows hyperdynamic LV with grade 3 diastolic dysfunction and elevated LAP bioprosthesis aortic valve in place there was severe TR, severe mitral valve disease and severe PAH with a dilated IVC.,  Limited follow-up echocardiogram continues to show hyperdynamic EF greater than 75% RV moderately enlarged with moderately elevated pulmonary artery systolic pressures, left atrial size dilated estimated right atrial pressure 15 mmHg,  Interim History / Subjective:  Still on NIPPV  Objective   Blood pressure 103/70, pulse (!) 59, temperature (!) 96.6 F (35.9 C), temperature source Axillary, resp. rate (!) 22, height 5' 3.75"  (1.619 m), weight 111.7 kg, SpO2 100%.    Vent Mode: BIPAP;PCV FiO2 (%):  [40 %] 40 % Set Rate:  [20 bmp] 20 bmp PEEP:  [5 cmH20] 5 cmH20   Intake/Output Summary (Last 24 hours) at 09/02/2023 0825 Last data filed at 09/02/2023 0700 Gross per 24 hour  Intake 338.24 ml  Output 660 ml  Net -321.76 ml   Filed Weights   08/31/23 0418 08/31/23 2124 09/02/23 0500  Weight: 108 kg 111.1 kg 111.7 kg   Examination:  General 68 year old female patient currently sedated on NIPPV HENT normocephalic atraumatic no obvious jugular venous distention Pulmonary scattered rhonchi, currently no accessory use, remains on NIPPV Arterial blood gas reviewed.  Portable chest x-ray personally reviewed shows bilateral pulmonary edema Aeration actually a little worse when comparing film from the 26th Cardiac diffuse systolic heart murmur Abdomen soft nontender Extremities with dependent edema Neuro sedated on Precedex , follows commands, oriented x 2-3 however intermittently confused GU clear yellow Ancillary Tests Personally Reviewed:    Assessment & Plan:  Acute on chronic hypoxic and hypercapnic respiratory failure with associated metabolic encephalopathy, secondary to volume overload, superimposed on underlying COPD and OSA Plan Continuing NIPPV Continuing diuresis Check procalcitonin Change her bronchodilator regimen to Brovana  budesonide and Yupelri Day 1 ceftriaxone  started by advanced heart failure for coverage of aspiration  HFpEF (grade III diastolic HF) with valvular heart disease, with prior bioprosthetic aortic valve replacement and moderate MV regurg and stenosis, complicated further by pulmonary hypertension Plan Holding tadalafil  today as directed by heart failure Cont NIPPV remove every 2-3 hrs for mouth care etc Continuing with aggressive diuresis, also adding metolazone  Wean norepinephrine to ensure mean arterial pressure greater than 70,  need to ensure coronary perfusion given  underlying RV dysfunction from pH Continue telemetry Hypotension preventing goal-directed therapy Trend BNP Getting PICC line, will assess central venous pressure as well as Co. Oximetry Advanced heart failure considering right heart cath  Acute metabolic encephalopathy.  Initially felt to be hypercarbia, she remains hypercarbic but not convinced this is fully explains confusion. Plan Cont low dose dex to facilitate NIPPV  AKI.  Serum creatinine climbing Plan Optimize cardiac output Careful with volume, actively diuresing currently but need to avoid overdiuresis Renal dose medications Strict intake output Keep Foley catheter  History of PAF Plan Telemetry Eliquis   NASH cirrhosis.  Plan Continue nadolol  Intermittent LFTs  Mild thrombocytopenia likely secondary to underlying cirrhosis currently stable Plan Trend CBC  H/o Sjogren's syndrome Plan Continuing hydroxychlorine  Best Practice (right click and "Reselect all SmartList Selections" daily)   Diet/type: NPO w/ oral meds DVT prophylaxis: SCDs Start: 08/25/23 1931 apixaban  (ELIQUIS ) tablet 5 mg   Pressure ulcers present: N/A GI prophylaxis: N/A Lines: Central line Foley:  Yes, and it is still needed Code Status:  full code Last date of multidisciplinary goals of care discussion [pending]    CRITICAL CARE  45 minutes  Performed by: Armstead Bertrand

## 2023-09-02 NOTE — Progress Notes (Signed)
 Peripherally Inserted Central Catheter Placement  The IV Nurse has discussed with the patient and/or persons authorized to consent for the patient, the purpose of this procedure and the potential benefits and risks involved with this procedure.  The benefits include less needle sticks, lab draws from the catheter, and the patient may be discharged home with the catheter. Risks include, but not limited to, infection, bleeding, blood clot (thrombus formation), and puncture of an artery; nerve damage and irregular heartbeat and possibility to perform a PICC exchange if needed/ordered by physician.  Alternatives to this procedure were also discussed.  Bard Power PICC patient education guide, fact sheet on infection prevention and patient information card has been provided to patient /or left at bedside.    PICC Placement Documentation  PICC Triple Lumen 09/02/23 Right Brachial 39 cm 1 cm (Active)  Indication for Insertion or Continuance of Line Vasoactive infusions 09/02/23 0950  Exposed Catheter (cm) 1 cm 09/02/23 0950  Site Assessment Clean, Dry, Intact 09/02/23 0950  Lumen #1 Status Flushed;Saline locked;Blood return noted 09/02/23 0950  Lumen #2 Status Saline locked;Flushed;Blood return noted 09/02/23 0950  Lumen #3 Status Flushed;Saline locked;Blood return noted 09/02/23 0950  Dressing Type Transparent;Securing device 09/02/23 0950  Dressing Status Antimicrobial disc/dressing in place;Clean, Dry, Intact 09/02/23 0950  Line Adjustment (NICU/IV Team Only) No 09/02/23 0950  Dressing Intervention Adhesive placed at insertion site (IV team only) 09/02/23 0950  Dressing Change Due 09/09/23 09/02/23 0950       Daphney Eans 09/02/2023, 9:52 AM

## 2023-09-02 NOTE — Progress Notes (Signed)
 SLP Cancellation Note  Patient Details Name: Whitney [name] MRN: [id number] DOB: 1955/08/05   Cancelled treatment:       Reason Eval/Treat Not Completed: Medical issues which prohibited therapy (placed on BiPAP). Will continue following.    Amil Kale, M.A., CCC-SLP Speech Language Pathology, Acute Rehabilitation Services  Secure Chat preferred [phone]  09/02/2023, 12:50 PM

## 2023-09-02 NOTE — Plan of Care (Signed)
   Problem: Coping: Goal: Ability to adjust to condition or change in health will improve Outcome: Progressing   Problem: Fluid Volume: Goal: Ability to maintain a balanced intake and output will improve Outcome: Progressing   Problem: Metabolic: Goal: Ability to maintain appropriate glucose levels will improve Outcome: Progressing

## 2023-09-02 NOTE — Progress Notes (Signed)
 PT Cancellation Note  Patient Details Name: Whitney [name] MRN: [id number] DOB: Aug 31, 1955   Cancelled Treatment:    Reason Eval/Treat Not Completed: Patient at procedure or test/unavailable.    Pura Browns [facility] 09/02/2023, 9:32 AM Angelina Kempf PT Acute Rehabilitation Services Office [phone]

## 2023-09-02 NOTE — TOC Progression Note (Signed)
 Transition of Care [facility]) - Progression Note    Patient Details  Name: Whitney [name] MRN: [id number] Date of Birth: 1956/04/05  Transition of Care [facility]) CM/SW Contact  Tom-Johnson, Ryin Ambrosius Daphne, RN Phone Number: 09/02/2023, 1:50 PM  Clinical Narrative:     Patient transferred to ICU, on BIPAP, IV abx, IV Lasix , Levo. Off Pressors.   Patient not Medically ready for discharge.  CM will continue to follow as patient progresses with care towards discharge.     Expected Discharge Plan: Home w Home Health Services    Expected Discharge Plan and Services   Discharge Planning Services: CM Consult Post Acute Care Choice: Home Health Living arrangements for the past 2 months: Single Family Home                           HH Arranged: OT, PT, Social Work, Nurse's Aide HH Agency: [facility] Date [facility] Agency Contacted: 08/28/23   Representative spoke with at [facility] Agency: Randel Buss   Social Determinants of Health (SDOH) Interventions SDOH Screenings   Food Insecurity: No Food Insecurity (08/27/2023)  Housing: Unknown (08/27/2023)  Transportation Needs: No Transportation Needs (08/27/2023)  Utilities: Not At Risk (08/27/2023)  Depression (PHQ2-9): Low Risk  (09/20/2020)  Recent Concern: Depression (PHQ2-9) - Medium Risk (07/11/2020)  Social Connections: Unknown (08/27/2023)  Tobacco Use: Low Risk  (08/25/2023)    Readmission Risk Interventions     No data to display

## 2023-09-02 NOTE — Progress Notes (Signed)
 Bp requirements up some Now on 10mcg/min Plan Will add vasopressin as recommended by advanced HF

## 2023-09-02 NOTE — Progress Notes (Signed)
 OT Cancellation Note  Patient Details Name: ALVESTA ODELL MRN: [id number] DOB: [birth year]   Cancelled Treatment:    Reason Eval/Treat Not Completed: Medical issues which prohibited therapy (placed back on bipap)  Jusitn Salsgiver K, OTD, OTR/L SecureChat Preferred Acute Rehab [phone]   Antionette Kirks 09/02/2023, 10:31 AM

## 2023-09-02 NOTE — Progress Notes (Signed)
 Advanced Heart Failure Progress Note  After initiation of Lasix  bolus + gtt 20/hr, she has had minimal urine output 385cc. Foley in place. Discussed with Dr. Alease Amend, not sure that RHC would change management.   Plan: - Rebolus Lasix  160 mg IVPB, increase gtt to 30/hr - Given Diuril 1000 mg IV - Start iNO 20 ppm - Low threshold to engage nephrology if interventions as above do not improve oliguria  Whitney Rozalynn Buege, NP 09/02/23  Advanced Heart Failure Team Pager [phone] (M-F; 7a - 5p)  Please contact [facility] for night-coverage after hours (4p -7a ) and weekends on [email or web address]

## 2023-09-02 NOTE — TOC Progression Note (Signed)
 Transition of Care [facility]) - Progression Note    Patient Details  Name: RUBANI KRUSZKA MRN: [id number] Date of Birth: [birth year]  Transition of Care [facility]) CM/SW Contact  Ernst Heap Phone Number: [phone] 09/02/2023, 3:34 PM  Clinical Narrative:   HF CSW met with patient and family at bedside. Patients daughter and granddaughters were present. CSW inquired with family to see if they needed anything or had any questions at this time. The family does not have any needs at this time. CSW inquired about the handoff and the possibility of SNF if appropriate when patient gets closer to dc. Patients daughter stated that if they have to go to SNF, [facility] would be their first option.   TOC will continue following.     Expected Discharge Plan: Home w Home Health Services    Expected Discharge Plan and Services   Discharge Planning Services: CM Consult Post Acute Care Choice: Home Health Living arrangements for the past 2 months: Single Family Home                           HH Arranged: OT, PT, Social Work, Nurse's Aide HH Agency: [facility] Date [facility] Agency Contacted: 08/28/23   Representative spoke with at [facility] Agency: Randel Buss   Social Determinants of Health (SDOH) Interventions SDOH Screenings   Food Insecurity: No Food Insecurity (08/27/2023)  Housing: Unknown (08/27/2023)  Transportation Needs: No Transportation Needs (08/27/2023)  Utilities: Not At Risk (08/27/2023)  Depression (PHQ2-9): Low Risk  (09/20/2020)  Recent Concern: Depression (PHQ2-9) - Medium Risk (07/11/2020)  Social Connections: Unknown (08/27/2023)  Tobacco Use: Low Risk  (08/25/2023)    Readmission Risk Interventions     No data to display

## 2023-09-02 NOTE — Progress Notes (Signed)
 PT Cancellation Note  Patient Details Name: Whitney [name] MRN: [id number] DOB: 1955-12-20   Cancelled Treatment:    Reason Eval/Treat Not Completed: Patient not medically ready. Since las seen by PT pt moved to ICU and on bipap. Will continue to monitor for readiness.    Pura Browns [facility] 09/02/2023, 10:24 AM Angelina Kempf PT Acute Rehabilitation Services Office [phone]

## 2023-09-02 NOTE — Progress Notes (Addendum)
 Advanced Heart Failure Rounding Note  Cardiologist: Janelle Mediate, MD  Chief Complaint: Acute hypoxic/hypercarbic resp failure Subjective:    660 cc UOP with Lasix  80 mg IV.  On/off Bipap. On NE 5. sCr 1.4>2.0  Lethargic on Bipap.  Objective:    Weight Range: 111.7 kg Body mass index is 42.6 kg/m.   Vital Signs:   Temp:  [97 F (36.1 C)-98 F (36.7 C)] 97.7 F (36.5 C) (04/28 0349) Pulse Rate:  [52-81] 59 (04/28 0615) Resp:  [11-28] 22 (04/28 0615) BP: (66-119)/(42-99) 103/70 (04/28 0615) SpO2:  [80 %-100 %] 100 % (04/28 0615) FiO2 (%):  [40 %-50 %] 40 % (04/28 0325) Weight:  [111.7 kg] 111.7 kg (04/28 0500) Last BM Date : 08/29/23  Weight change: Filed Weights   08/31/23 0418 08/31/23 2124 09/02/23 0500  Weight: 108 kg 111.1 kg 111.7 kg   Intake/Output:  Intake/Output Summary (Last 24 hours) at 09/02/2023 0707 Last data filed at 09/02/2023 0700 Gross per 24 hour  Intake 338.24 ml  Output 660 ml  Net -321.76 ml    Physical Exam    General: Acutely ill appearing. No distress on Bipap Cardiac: JVP difficult to assess. S1 and S2 present. No murmurs or rub. Resp: bases diminished, coarse uppers Abdomen: Soft, non-tender, non-distended.  Extremities: Warm and dry.  2+ BLE edema.  Neuro: RASS -1/0  Telemetry   SB in 50s (personally reviewed)  EKG    No new ECG to review  Labs    CBC Recent Labs    08/31/23 2217 09/02/23 0225 09/02/23 0326  WBC 4.4 5.5  --   HGB 12.6 12.5 12.9  HCT 41.0 39.8 38.0  MCV 99.0 97.3  --   PLT 81* 86*  --    Basic Metabolic Panel Recent Labs    04/54/09 2217 09/02/23 0225 09/02/23 0326  NA 143 141 141  K 5.0 4.9 5.0  CL 108 106  --   CO2 26 26  --   GLUCOSE 84 115*  --   BUN 51* 58*  --   CREATININE 1.41* 2.00*  --   CALCIUM  9.4 9.2  --    Liver Function Tests Recent Labs    08/31/23 2217  AST 25  ALT 10  ALKPHOS 44  BILITOT 1.1  PROT 5.7*  ALBUMIN  2.6*   BNP: BNP (last 3 results) Recent  Labs    04/07/23 1603 04/23/23 1059 08/31/23 2217  BNP 515.8* 616.0* 1,322.9*   ProBNP (last 3 results) Recent Labs    03/13/23 1428  PROBNP 996.0*   Medications:    Scheduled Medications:  apixaban   5 mg Oral BID   arformoterol   15 mcg Nebulization BID   And   umeclidinium bromide   1 puff Inhalation Daily   Chlorhexidine  Gluconate Cloth  6 each Topical Q0600   ezetimibe   10 mg Oral QHS   hydroxychloroquine   200 mg Oral Daily   lidocaine   1 patch Transdermal Q24H   nadolol   40 mg Oral Daily   mouth rinse  15 mL Mouth Rinse 4 times per day   rosuvastatin   10 mg Oral Daily   Infusions:  sodium chloride  Stopped (09/01/23 1711)   dexmedetomidine  (PRECEDEX ) IV infusion 0.3 mcg/kg/hr (09/02/23 0700)   norepinephrine (LEVOPHED) Adult infusion 5 mcg/min (09/02/23 0700)    PRN Medications: acetaminophen  **OR** acetaminophen , albuterol , antiseptic oral rinse, fentaNYL  (SUBLIMAZE ) injection, melatonin, naLOXone  (NARCAN )  injection, ondansetron  (ZOFRAN ) IV, mouth rinse  Patient Profile   Whitney [name]  is a 68-year-old female with a complex past medical history of Sjogren syndrome, cirrhosis, rheumatic heart disease with bioprosthetic AVR, mitral valve disease, pulmonary hypertension.    Assessment/Plan   Pulmonary hypertension Acute on chronic hypoxic and hypercarbic respiratory failure - RHC in 2024 most consistent with precapillary PH, started on tadalafil   - Done after diuresis, and suspect this underestimates her group II component, especially given mitral valve disease - Does have a large component of portopulmonary HTN, especially given high cardiac output - Echo this admission: EF >75%, elevated PASP, severe mitral annular calcification - Poor UOP with IV Lasix  x1  yesterday. Give Lasix  160 mg IV bolus + Lasix  gtt 20/hr. Consider dosing with metolazone  2.5 mg this afternoon. Will follow response closely - On NE 5, use to assist with BP, RV support, and diuresis. MAP  >70 - GDMT limited by acute exacerbation - Consider MRA as diuresis is started - Poor SGLT2i candidate - Hold tadalfil for now given significant volume overload, hypoxic respiratory failure. Consider restart at dc. - Continue positive pressure ventilation  - Repeat CXR; High risk for asp pna; start coverage with IV rocephin  - Consider further evaluation with RHC, if does not response to Lasix     AKI - Cr rising with poor UOP; 1.41>2.00 today - diuresis and BP support as above.  - BMET this afternoon; follow closely  Valvular disease  - S/p bioprosthetic AVR, severe MAC - Gradients mildly high for AVR, but otherwise functioning well   Cirrhosis - due to MAFLD - Continue nadolol  40mg  daily, renally dosing per PharmD - Crestor  10 mg daily    pAF:  - Continue eliquis  - SB on tele  Length of Stay: 8  CRITICAL CARE Performed by: [place] Mayson Sterbenz  Total critical care time: 12 minutes  Critical care time was exclusive of separately billable procedures and treating other patients.  Critical care was necessary to treat or prevent imminent or life-threatening deterioration.  Critical care was time spent personally by me on the following activities: development of treatment plan with patient and/or surrogate as well as nursing, discussions with consultants, evaluation of patient's response to treatment, examination of patient, obtaining history from patient or surrogate, ordering and performing treatments and interventions, ordering and review of laboratory studies, ordering and review of radiographic studies, pulse oximetry and re-evaluation of patient's condition.  [place] Sovereign Ramiro, NP  09/02/2023, 7:07 AM  Advanced Heart Failure Team Pager [phone] (M-F; 7a - 5p)  Please contact [facility] for night-coverage after hours (5p -7a ) and weekends on [email or web address]

## 2023-09-03 ENCOUNTER — Inpatient Hospital Stay (HOSPITAL_COMMUNITY)

## 2023-09-03 DIAGNOSIS — J9601 Acute respiratory failure with hypoxia: Secondary | ICD-10-CM | POA: Diagnosis not present

## 2023-09-03 DIAGNOSIS — G9340 Encephalopathy, unspecified: Secondary | ICD-10-CM | POA: Diagnosis not present

## 2023-09-03 DIAGNOSIS — J9622 Acute and chronic respiratory failure with hypercapnia: Secondary | ICD-10-CM | POA: Diagnosis not present

## 2023-09-03 DIAGNOSIS — J9621 Acute and chronic respiratory failure with hypoxia: Secondary | ICD-10-CM | POA: Diagnosis not present

## 2023-09-03 DIAGNOSIS — J9602 Acute respiratory failure with hypercapnia: Secondary | ICD-10-CM | POA: Diagnosis not present

## 2023-09-03 DIAGNOSIS — I10 Essential (primary) hypertension: Secondary | ICD-10-CM | POA: Diagnosis not present

## 2023-09-03 DIAGNOSIS — I272 Pulmonary hypertension, unspecified: Secondary | ICD-10-CM | POA: Diagnosis not present

## 2023-09-03 LAB — POCT I-STAT 7, (LYTES, BLD GAS, ICA,H+H)
Acid-Base Excess: 4 mmol/L — ABNORMAL HIGH (ref 0.0–2.0)
Bicarbonate: 31.5 mmol/L — ABNORMAL HIGH (ref 20.0–28.0)
Calcium, Ion: 1.28 mmol/L (ref 1.15–1.40)
HCT: 42 % (ref 36.0–46.0)
Hemoglobin: 14.3 g/dL (ref 12.0–15.0)
O2 Saturation: 99 %
Potassium: 4.5 mmol/L (ref 3.5–5.1)
Sodium: 138 mmol/L (ref 135–145)
TCO2: 33 mmol/L — ABNORMAL HIGH (ref 22–32)
pCO2 arterial: 59 mmHg — ABNORMAL HIGH (ref 32–48)
pH, Arterial: 7.335 — ABNORMAL LOW (ref 7.35–7.45)
pO2, Arterial: 134 mmHg — ABNORMAL HIGH (ref 83–108)

## 2023-09-03 LAB — GLUCOSE, CAPILLARY
Glucose-Capillary: 129 mg/dL — ABNORMAL HIGH (ref 70–99)
Glucose-Capillary: 132 mg/dL — ABNORMAL HIGH (ref 70–99)
Glucose-Capillary: 143 mg/dL — ABNORMAL HIGH (ref 70–99)
Glucose-Capillary: 144 mg/dL — ABNORMAL HIGH (ref 70–99)
Glucose-Capillary: 153 mg/dL — ABNORMAL HIGH (ref 70–99)
Glucose-Capillary: 92 mg/dL (ref 70–99)

## 2023-09-03 LAB — COOXEMETRY PANEL
Carboxyhemoglobin: 1.9 % — ABNORMAL HIGH (ref 0.5–1.5)
Methemoglobin: 0.8 % (ref 0.0–1.5)
O2 Saturation: 81.5 %
Total hemoglobin: 14 g/dL (ref 12.0–16.0)

## 2023-09-03 LAB — AMMONIA: Ammonia: 40 umol/L — ABNORMAL HIGH (ref 9–35)

## 2023-09-03 LAB — BASIC METABOLIC PANEL WITH GFR
Anion gap: 12 (ref 5–15)
BUN: 60 mg/dL — ABNORMAL HIGH (ref 8–23)
CO2: 26 mmol/L (ref 22–32)
Calcium: 9 mg/dL (ref 8.9–10.3)
Chloride: 97 mmol/L — ABNORMAL LOW (ref 98–111)
Creatinine, Ser: 1.8 mg/dL — ABNORMAL HIGH (ref 0.44–1.00)
GFR, Estimated: 30 mL/min — ABNORMAL LOW (ref >60)
Glucose, Bld: 256 mg/dL — ABNORMAL HIGH (ref 70–99)
Potassium: 4.3 mmol/L (ref 3.5–5.1)
Sodium: 135 mmol/L (ref 135–145)

## 2023-09-03 LAB — PROCALCITONIN: Procalcitonin: <0.1 ng/mL

## 2023-09-03 LAB — MAGNESIUM: Magnesium: 1.9 mg/dL (ref 1.7–2.4)

## 2023-09-03 LAB — BRAIN NATRIURETIC PEPTIDE: B Natriuretic Peptide: 2106.9 pg/mL — ABNORMAL HIGH (ref 0.0–100.0)

## 2023-09-03 LAB — PHOSPHORUS: Phosphorus: 4.4 mg/dL (ref 2.5–4.6)

## 2023-09-03 LAB — TROPONIN I (HIGH SENSITIVITY): Troponin I (High Sensitivity): 15 ng/L (ref <18)

## 2023-09-03 MED ORDER — INSULIN ASPART 100 UNIT/ML IJ SOLN: 0.0000 [IU] | Freq: Three times a day (TID) | INTRAMUSCULAR | Status: DC

## 2023-09-03 MED ORDER — INSULIN ASPART 100 UNIT/ML IJ SOLN
0.0000 [IU] | Freq: Three times a day (TID) | INTRAMUSCULAR | Status: DC
  Administered 2023-09-03: 2 [IU] via SUBCUTANEOUS
  Administered 2023-09-04 – 2023-09-07 (×8): 1 [IU] via SUBCUTANEOUS

## 2023-09-03 MED ORDER — ACETAZOLAMIDE 250 MG PO TABS
500.0000 mg | ORAL_TABLET | Freq: Two times a day (BID) | ORAL | Status: AC
  Administered 2023-09-03 (×2): 500 mg via ORAL
  Filled 2023-09-03 (×2): qty 2

## 2023-09-03 MED ORDER — MAGNESIUM SULFATE IN D5W 1-5 GM/100ML-% IV SOLN
1.0000 g | Freq: Once | INTRAVENOUS | Status: AC
  Administered 2023-09-03: 1 g via INTRAVENOUS
  Filled 2023-09-03: qty 100

## 2023-09-03 MED ORDER — GABAPENTIN 100 MG PO CAPS
100.0000 mg | ORAL_CAPSULE | Freq: Three times a day (TID) | ORAL | Status: DC
  Administered 2023-09-03 – 2023-09-08 (×17): 100 mg via ORAL
  Filled 2023-09-03 (×18): qty 1

## 2023-09-03 MED ORDER — ORAL CARE MOUTH RINSE: 15.0000 mL | OROMUCOSAL | Status: DC | PRN

## 2023-09-03 MED ORDER — SENNA 8.6 MG PO TABS
2.0000 | ORAL_TABLET | Freq: Every day | ORAL | Status: DC
  Administered 2023-09-03 – 2023-09-06 (×3): 17.2 mg via ORAL
  Filled 2023-09-03 (×3): qty 2

## 2023-09-03 MED ORDER — MIDODRINE HCL 5 MG PO TABS
10.0000 mg | ORAL_TABLET | Freq: Three times a day (TID) | ORAL | Status: DC
  Administered 2023-09-03 – 2023-09-05 (×6): 10 mg via ORAL
  Filled 2023-09-03 (×6): qty 2

## 2023-09-03 NOTE — Evaluation (Signed)
 Occupational Therapy Evaluation Patient Details Name: Whitney [name] MRN: [id number] DOB: 01/22/1956 Today's Date: 09/03/2023   History of Present Illness   Whitney [name] is a 68 y.o. female with medical history significant for paroxysmal atrial fibrillation currently anticoagulated on Eliquis , NASH cirrhosis, type 2 diabetes mellitus companied by diabetic peripheral polyneuropathy, hyperlipidemia, aortic stenosis status post aortic valve replacement in August 2019, Sosan syndrome, moderate persistent asthma, thrombocytopenia with baseline platelet count 40s to 90s, who is admitted to [facility] on 08/25/2023 with acute encephalopathy after presenting from home to [facility] ED for evaluation of altered mental status. Transferred to ICU 4/26 for worsening ABG/incr lethargy.     Clinical Impressions Pt seen s/p transfer to ICU, now off bipap on 4L [place]. Son present in room, pt needing min A for pivot transfer and for seated ADL tasks. Pt with impaired cognition, needs mod cues to perform and attend to oral care task, pt trying to "tear open" the toothpaste tube vs unscrewing cap, needed hand -over-hand assist to place toothpaste on toothbrush. Pt supervision for bed mobility. Pt presenting with impairments listed below, will follow acutely. Patient will benefit from intensive inpatient follow-up therapy, >3 hours/day to maximize safety/ind with ADL/functional mobility.      If plan is discharge home, recommend the following:   Supervision due to cognitive status;Direct supervision/assist for financial management;Direct supervision/assist for medications management;Assist for transportation;A lot of help with bathing/dressing/bathroom;A little help with walking and/or transfers     Functional Status Assessment   Patient has had a recent decline in their functional status and demonstrates the ability to make significant improvements in function in a reasonable and predictable amount of time.      Equipment Recommendations   Other (comment) (defer)     Recommendations for Other Services   PT consult;Rehab consult     Precautions/Restrictions   Precautions Precautions: Fall Recall of Precautions/Restrictions: Impaired Restrictions Weight Bearing Restrictions Per Provider Order: No     Mobility Bed Mobility Overal bed mobility: Needs Assistance Bed Mobility: Supine to Sit     Supine to sit: Supervision, HOB elevated, Used rails          Transfers Overall transfer level: Needs assistance Equipment used: Rolling walker (2 wheels) Transfers: Sit to/from Stand, Bed to chair/wheelchair/BSC Sit to Stand: Contact guard assist, Min assist     Step pivot transfers: Contact guard assist, Min assist            Balance Overall balance assessment: Needs assistance Sitting-balance support: No upper extremity supported, Feet supported Sitting balance-Leahy Scale: Good     Standing balance support: Bilateral upper extremity supported, During functional activity Standing balance-Leahy Scale: Poor Standing balance comment: reliant on external support                           ADL either performed or assessed with clinical judgement   ADL Overall ADL's : Needs assistance/impaired     Grooming: Oral care;Moderate assistance;Sitting Grooming Details (indicate cue type and reason): HOHA and visual demo of how to untwist toothapste cap to brush teeth                 Toilet Transfer: Minimal assistance;Stand-pivot;Rolling walker (2 wheels);BSC/3in1 Toilet Transfer Details (indicate cue type and reason): simulated via functional mobility         Functional mobility during ADLs: Minimal assistance;Rolling walker (2 wheels)       Vision Baseline Vision/History: 1 Wears  glasses Additional Comments: will further assess     Perception Perception: Not tested       Praxis Praxis: Not tested       Pertinent Vitals/Pain Pain  Assessment Pain Assessment: No/denies pain     Extremity/Trunk Assessment Upper Extremity Assessment Upper Extremity Assessment: Generalized weakness (decr LUE coordiantion)   Lower Extremity Assessment Lower Extremity Assessment: Defer to PT evaluation   Cervical / Trunk Assessment Cervical / Trunk Assessment: Normal   Communication Communication Communication: No apparent difficulties   Cognition Arousal: Alert Behavior During Therapy: WFL for tasks assessed/performed Cognition: Cognition impaired   Orientation impairments: Situation, Time Awareness: Intellectual awareness intact, Online awareness impaired Memory impairment (select all impairments): Declarative long-term memory, Non-declarative long-term memory, Working [employer] fast streamer, Short-term memory Attention impairment (select first level of impairment): Focused attention, Sustained attention   OT - Cognition Comments: does not recall date, tangential at times and needs mod cues to redirect/attend to tasks. Pt  needing hand over hand assist/visual demonstration of how to untwist toothpaste cap from tube                 Following commands: Intact       Cueing  General Comments   Cueing Techniques: Verbal cues  VSS on 4L O2   Exercises     Shoulder Instructions      Home Living Family/patient expects to be discharged to:: Private residence Living Arrangements: Children Available Help at Discharge: Family;Available 24 hours/day Type of Home: House Home Access: Stairs to enter [employer] of Steps: 2 steps + 2 steps Entrance Stairs-Rails: None Home Layout: One level;Laundry or work area in [employer] Shower/Tub: Tub/shower unit;Door   [employer] Toilet: Standard Bathroom Accessibility: Yes   Home Equipment: Grab bars - toilet;Grab bars - tub/shower;Educational psychologist (4 wheels)      Lives With: Son    Prior Functioning/Environment Prior Level of Function :  Driving;Independent/Modified Independent             Mobility Comments: pt reports typically ambulating without DME, does utilize rollator PRN ADLs Comments: Manages her own meds, cooks, home mgt;very independent. Caretaking for her son at times driving him to doctors appointments    OT Problem List: Decreased strength;Impaired balance (sitting and/or standing);Decreased cognition;Decreased knowledge of precautions;Decreased safety awareness;Decreased activity tolerance;Decreased coordination;Decreased knowledge of use of DME or AE   OT Treatment/Interventions: Self-care/ADL training;DME and/or AE instruction;Therapeutic activities;Balance training;Patient/family education      OT Goals(Current goals can be found in the care plan section)   Acute Rehab OT Goals Patient Stated Goal: none stated OT Goal Formulation: With patient Time For Goal Achievement: 09/10/23 Potential to Achieve Goals: Good   OT Frequency:  Min 2X/week    Co-evaluation              AM-PAC OT "6 Clicks" Daily Activity     Outcome Measure Help from another person eating meals?: A Little Help from another person taking care of personal grooming?: A Lot Help from another person toileting, which includes using toliet, bedpan, or urinal?: A Little Help from another person bathing (including washing, rinsing, drying)?: A Lot Help from another person to put on and taking off regular upper body clothing?: A Little Help from another person to put on and taking off regular lower body clothing?: A Lot 6 Click Score: 15   End of Session Equipment Utilized During Treatment: Gait belt;Rolling walker (2 wheels) Nurse Communication: Mobility status  Activity Tolerance: Patient tolerated treatment  well Patient left: in chair;with call bell/phone within reach;with chair alarm set;with family/visitor present  OT Visit Diagnosis: Unsteadiness on feet (R26.81);Other abnormalities of gait and mobility (R26.89);Other  symptoms and signs involving cognitive function                Time: 1610-9604 OT Time Calculation (min): 33 min Charges:  OT General Charges $OT Visit: 1 Visit OT Evaluation $OT Re-eval: 1 Re-eval OT Treatments $Self Care/Home Management : 8-22 mins  Latangela Mccomas K, OTD, OTR/L SecureChat Preferred Acute Rehab [phone]   Benedict Brain Koonce 09/03/2023, 12:04 PM

## 2023-09-03 NOTE — Evaluation (Signed)
 Physical Therapy Re Evaluation Patient Details Name: Whitney [name] MRN: [id number] DOB: 1955/05/12 Today's Date: 09/03/2023  History of Present Illness  68 yo female admitted on 08/25/23 with acute encephalopathy. Transferred to ICU 4/26 for worsening ABG/ increased lethargy.  PMH: NASH cirrhosis, DM2, PAF on eliquis , sjogren's syndrome, severe AS s/p AVR, OSA on CPAP.  Clinical Impression  Pt admitted with above diagnosis. Pt re-evaluated due to transfer to ICU. Pt asleep in chair with partial lunch eaten, pt reported not wanting more when woken and that she doesn't like the food. Pt performed LE and UE there ex as well as reaching activities to work on balance but also following directions. Pt has difficulty understanding directions, esp when involving, front/back, right/left. Needs visual and tactile cues and becomes frustrated at times. Pt able to stand with min A and RW and ambulate to bed with manual facilitation to figure out which way to turn RW to get to bed. Patient will benefit from intensive inpatient follow-up therapy, >3 hours/day.  Pt currently with functional limitations due to the deficits listed below (see PT Problem List). Pt will benefit from acute skilled PT to increase their independence and safety with mobility to allow discharge.           If plan is discharge home, recommend the following: A little help with walking and/or transfers;A little help with bathing/dressing/bathroom;Assistance with cooking/housework;Direct supervision/assist for medications management;Assist for transportation;Supervision due to cognitive status;Help with stairs or ramp for entrance   Can travel by private vehicle        Equipment Recommendations Rolling walker (2 wheels)  Recommendations for Other Services       Functional Status Assessment Patient has had a recent decline in their functional status and demonstrates the ability to make significant improvements in function in a reasonable and  predictable amount of time.     Precautions / Restrictions Precautions Precautions: Fall Recall of Precautions/Restrictions: Impaired Restrictions Weight Bearing Restrictions Per Provider Order: No      Mobility  Bed Mobility Overal bed mobility: Needs Assistance Bed Mobility: Sit to Supine       Sit to supine: Contact guard assist, Used rails   General bed mobility comments: increased time and cues for sequencing.    Transfers Overall transfer level: Needs assistance Equipment used: Rolling walker (2 wheels) Transfers: Sit to/from Stand, Bed to chair/wheelchair/BSC Sit to Stand: Min assist   Step pivot transfers: Min assist       General transfer comment: pt needed manual facilitation for problem solving turning around with RW    Ambulation/Gait Ambulation/Gait assistance: Min assist Gait Distance (Feet): 4 Feet Assistive device: Rolling walker (2 wheels) Gait Pattern/deviations: Decreased stride length, Step-through pattern, Staggering left, Staggering right, Wide base of support Gait velocity: decreased Gait velocity interpretation: <1.31 ft/sec, indicative of household ambulator   General Gait Details: 1 LOB with stepping, in part due to pt's confusion with task. Min A to recover balance and RW  Stairs            Wheelchair Mobility     Tilt Bed    Modified Rankin (Stroke Patients Only)       Balance Overall balance assessment: Needs assistance Sitting-balance support: No upper extremity supported, Feet supported Sitting balance-Leahy Scale: Fair     Standing balance support: Bilateral upper extremity supported, During functional activity Standing balance-Leahy Scale: Poor Standing balance comment: reliant on external support  Pertinent Vitals/Pain Pain Assessment Pain Assessment: No/denies pain    Home Living Family/patient expects to be discharged to:: Private residence Living Arrangements:  Children Available Help at Discharge: Family;Available 24 hours/day Type of Home: House Home Access: Stairs to enter Entrance Stairs-Rails: None Entrance Stairs-Number of Steps: 2 steps + 2 steps   Home Layout: One level;Laundry or work area in [employer]: Grab bars - toilet;Grab bars - tub/shower;Shower seat;Rollator (4 wheels)      Prior Function Prior Level of Function : Driving;Independent/Modified Independent             Mobility Comments: pt reports typically ambulating without DME, does utilize rollator PRN ADLs Comments: Manages her own meds, cooks, home mgt;very independent. Caretaking for her son at times driving him to doctors appointments     Extremity/Trunk Assessment   Upper Extremity Assessment Upper Extremity Assessment: Defer to OT evaluation    Lower Extremity Assessment Lower Extremity Assessment: Generalized weakness    Cervical / Trunk Assessment Cervical / Trunk Assessment: Normal  Communication   Communication Communication: No apparent difficulties    Cognition Arousal: Alert Behavior During Therapy: WFL for tasks assessed/performed   PT - Cognitive impairments: Problem solving, Sequencing, Safety/Judgement, Initiation                       PT - Cognition Comments: Difficulty sequencing and problem solving. Confused conversation. Has great difficulty with directional cues such as fwd and bkwd Following commands: Impaired Following commands impaired: Follows one step commands inconsistently     Cueing Cueing Techniques: Verbal cues, Tactile cues     General Comments General comments (skin integrity, edema, etc.): Pt in rate controlled Afib on 4L O2    Exercises General Exercises - Lower Extremity Ankle Circles/Pumps: AROM, Both, 20 reps, Seated Long Arc Quad: AROM, Both, 10 reps, Seated Other Exercises Other Exercises: reaching in multiple planes with BUE's in sitting   Assessment/Plan    PT Assessment  Patient needs continued PT services  PT Problem List Decreased strength;Decreased range of motion;Decreased activity tolerance;Decreased balance;Decreased coordination;Decreased mobility;Decreased cognition;Decreased knowledge of use of DME;Decreased safety awareness;Decreased knowledge of precautions;Cardiopulmonary status limiting activity       PT Treatment Interventions DME instruction;Gait training;Stair training;Functional mobility training;Therapeutic activities;Therapeutic exercise;Balance training;Neuromuscular re-education;Cognitive remediation;Patient/family education    PT Goals (Current goals can be found in the Care Plan section)  Acute Rehab PT Goals Patient Stated Goal: to go home PT Goal Formulation: Patient unable to participate in goal setting Time For Goal Achievement: 09/17/23 Potential to Achieve Goals: Good    Frequency Min 3X/week     Co-evaluation               AM-PAC PT "6 Clicks" Mobility  Outcome Measure Help needed turning from your back to your side while in a flat bed without using bedrails?: A Little Help needed moving from lying on your back to sitting on the side of a flat bed without using bedrails?: A Little Help needed moving to and from a bed to a chair (including a wheelchair)?: A Little Help needed standing up from a chair using your arms (e.g., wheelchair or bedside chair)?: A Little Help needed to walk in hospital room?: A Lot Help needed climbing 3-5 steps with a railing? : A Lot 6 Click Score: 16    End of Session Equipment Utilized During Treatment: Oxygen  Activity Tolerance: Patient tolerated treatment well Patient left: with call bell/phone within reach;in bed;with nursing/sitter in room (RN  present to set up for bathing) Nurse Communication: Mobility status PT Visit Diagnosis: Other abnormalities of gait and mobility (R26.89)    Time: 1610-9604 PT Time Calculation (min) (ACUTE ONLY): 20 min   Charges:   PT Evaluation $PT  Re-evaluation: 1 Re-eval   PT General Charges $$ ACUTE PT VISIT: 1 Visit         Amey Ka, PT  Acute Rehab Services Secure chat preferred Office [phone]   Deloris Fetters Tahra Hitzeman 09/03/2023, 2:38 PM

## 2023-09-03 NOTE — Progress Notes (Addendum)
 eLink Physician-Brief Progress Note Patient Name: Whitney [name] DOB: [birth year] MRN: [id number]   Date of Service  09/03/2023  HPI/Events of Note  A fib HR 98. Since 6 PM from 28 th.   Liver cirrhosis, Encephalopathy. On BiPAP. OSA.  S/p AVR.COPD. on lasix  drip.  Camera:  HR 98 to 110. MAP good. Sats > 95%   Discussed with RN.    eICU Interventions  Get /send AM labs stat now. Add po4, ion calcium /ABG, EKG stat. On eliquis . Hemodynamically stable A fib, has frequent PVC's.       Intervention Category Intermediate Interventions: Arrhythmia - evaluation and management  Rexann Catalan 09/03/2023, 2:48 AM  04:04 Labs reviewed Type 2 resp failure, pco2 at 59. Stable central retinal artery occlusion 1.8 Elevated pro BNP > 2000, trop normal. EKG a fib, qtc ok. No acute ST T changes. K level is < 4.5.   Mag 1.9: 1 gm mag IV ordering.

## 2023-09-03 NOTE — Progress Notes (Signed)
 Advanced Heart Failure Rounding Note  Cardiologist: Janelle Mediate, MD   Chief Complaint: Acute on chronic hypoxic/hypercarbic resp failure  Subjective:    Pressor requirement increased yesterday evening, now on 8 NE + 0.03 Vaso.   ~4.4 L UOP last 24 hrs with 160 mg lasix  IV X 2 + lasix  gtt at 30/hr + 1000 mg diuril. CVP 10.  Precedex  discontinued this am. Currently appears comfortably off BiPAP. Intermittent agitation, suspected delirium.    Objective:    Weight Range: 111.7 kg Body mass index is 42.6 kg/m.   Vital Signs:   Temp:  [96.6 F (35.9 C)-98.2 F (36.8 C)] 98.1 F (36.7 C) (04/29 0426) Pulse Rate:  [56-138] 82 (04/29 0600) Resp:  [14-30] 18 (04/29 0600) BP: (89-142)/(60-109) 125/90 (04/29 0600) SpO2:  [78 %-100 %] 96 % (04/29 0600) FiO2 (%):  [40 %] 40 % (04/29 0400) Last BM Date : 08/29/23  Weight change: Filed Weights   08/31/23 0418 08/31/23 2124 09/02/23 0500  Weight: 108 kg 111.1 kg 111.7 kg   Intake/Output:  Intake/Output Summary (Last 24 hours) at 09/03/2023 0658 Last data filed at 09/03/2023 0600 Gross per 24 hour  Intake 1240 ml  Output 4375 ml  Net -3135 ml    Physical Exam    General:  Acute on chronically ill appearing. Off BiPAP. Neck: JVP 8-9 Cor: Irregular rhythm. No rubs, gallops or murmurs. Lungs: breathing nonlabored, diminished Abdomen: obese, soft, nontender, nondistended.  Extremities: 1-2+ edema Neuro: Not interactive. Intermittently confused/agitated   Telemetry   Went into Afib yesterday around 1 pm, rate currently 90s  EKG    No new ECG to review  Labs    CBC Recent Labs    08/31/23 2217 09/02/23 0225 09/02/23 0326 09/02/23 1550 09/03/23 0351  WBC 4.4 5.5  --   --   --   HGB 12.6 12.5   < > 12.6 14.3  HCT 41.0 39.8   < > 37.0 42.0  MCV 99.0 97.3  --   --   --   PLT 81* 86*  --   --   --    < > = values in this interval not displayed.   Basic Metabolic Panel Recent Labs    81/19/14 1400  09/02/23 1550 09/02/23 1952 09/03/23 0320 09/03/23 0351  NA 135   < >  --  135 138  K 4.7   < >  --  4.3 4.5  CL 101  --   --  97*  --   CO2 23  --   --  26  --   GLUCOSE 269*  --   --  256*  --   BUN 60*  --   --  60*  --   CREATININE 1.96*  --   --  1.80*  --   CALCIUM  8.7*  --   --  9.0  --   MG  --   --  2.2 1.9  --   PHOS  --   --   --  4.4  --    < > = values in this interval not displayed.   Liver Function Tests Recent Labs    08/31/23 2217  AST 25  ALT 10  ALKPHOS 44  BILITOT 1.1  PROT 5.7*  ALBUMIN  2.6*   BNP: BNP (last 3 results) Recent Labs    08/31/23 2217 09/02/23 1545 09/03/23 0320  BNP 1,322.9* 1,408.9* 2,106.9*   ProBNP (last 3 results) Recent Labs  03/13/23 1428  PROBNP 996.0*   Medications:    Scheduled Medications:  apixaban   5 mg Oral BID   arformoterol   15 mcg Nebulization BID   Chlorhexidine  Gluconate Cloth  6 each Topical Q0600   ezetimibe   10 mg Oral QHS   hydroxychloroquine   200 mg Oral Daily   lidocaine   1 patch Transdermal Q24H   nadolol   40 mg Oral Q36H   mouth rinse  15 mL Mouth Rinse 4 times per day   polyethylene glycol  17 g Oral Daily   revefenacin  175 mcg Nebulization Daily   rosuvastatin   10 mg Oral Daily   senna  1 tablet Oral Daily   sodium chloride  flush  10-40 mL Intracatheter Q12H   Infusions:  cefTRIAXone  (ROCEPHIN )  IV Stopped (09/02/23 1047)   dexmedetomidine  (PRECEDEX ) IV infusion 0.4 mcg/kg/hr (09/03/23 0600)   furosemide  (LASIX ) 200 mg in dextrose  5 % 100 mL (2 mg/mL) infusion 30 mg/hr (09/03/23 0600)   norepinephrine (LEVOPHED) Adult infusion 8 mcg/min (09/03/23 0600)   vasopressin 0.03 Units/min (09/03/23 0600)    PRN Medications: acetaminophen  **OR** acetaminophen , albuterol , antiseptic oral rinse, melatonin, naLOXone  (NARCAN )  injection, ondansetron  (ZOFRAN ) IV, mouth rinse, sodium chloride  flush  Patient Profile   Whitney [name] is a 68-year-old female with a complex past medical history of  Sjogren syndrome, cirrhosis, rheumatic heart disease with bioprosthetic AVR, mitral valve disease, pulmonary hypertension.    Assessment/Plan  HFpEF Pulmonary hypertension - RHC in 2024 most consistent with precapillary PH, started on tadalafil . Done after diuresis, and suspect this underestimates her group II component, especially given mitral valve disease - Does have a large component of portopulmonary HTN, especially given high cardiac output - Echo this admission: EF >75%, elevated PASP, severe mitral annular calcification - Diuresing well. Continue lasix  gtt today, discontinue this evening. Give 500 mg diamox BID today - Wean pressors as able, currently on 8 NE + 0.03 Vaso. Add midodrine 10 TID. Suspect liver disease is contributing to hypotension. - GDMT limited by acute exacerbation and hypotension - Poor SGLT2i candidate - Hold tadalfil for now given significant volume overload, hypoxic respiratory failure. Consider restart prior to dc.  Acute on chronic hypoxic and hypercapnic respiratory failure -In setting of COPD, OSA and volume overload -BiPAP PRN and at HS -Seems to be improving with diuresis -Covering for aspiration PNA  AKI -Suspect cardiorenal +/- hepatorenal?  -Scr starting to improve with diuresis, 2.0>1.8  Valvular disease  - S/p bioprosthetic AVR, severe MAC - Gradients mildly high for AVR, but otherwise functioning well   Cirrhosis - due to NAFLD - Continue nadolol  40mg  daily, renally dosing per PharmD - Crestor  10 mg daily    PAF:  - Continue eliquis  - Went into AF yesterday afternoon, currently 90s  Length of Stay: 9  CRITICAL CARE Performed by: Gerilyn Kobus N   Total critical care time: 18 minutes  Critical care time was exclusive of separately billable procedures and treating other patients.  Critical care was necessary to treat or prevent imminent or life-threatening deterioration.  Critical care was time spent personally by me on the  following activities: development of treatment plan with patient and/or surrogate as well as nursing, discussions with consultants, evaluation of patient's response to treatment, examination of patient, obtaining history from patient or surrogate, ordering and performing treatments and interventions, ordering and review of laboratory studies, ordering and review of radiographic studies, pulse oximetry and re-evaluation of patient's condition.   Elige Shouse N, PA-C  09/03/2023,  6:58 AM  Advanced Heart Failure Team Pager [phone] (M-F; 7a - 5p)  Please contact [facility] for night-coverage after hours (5p -7a ) and weekends on [email or web address]

## 2023-09-03 NOTE — Progress Notes (Signed)
  Inpatient Rehab Admissions Coordinator :  Per OT therapy recommendations, patient was screened for CIR candidacy by Ottie Glazier RN MSN.  At this time patient appears to be a potential candidate for CIR. I will place a rehab consult per protocol for full assessment. Please call me with any questions.  Ottie Glazier RN MSN Admissions Coordinator [phone]

## 2023-09-03 NOTE — Plan of Care (Signed)
  Problem: Ischemic Stroke/TIA Tissue Perfusion: Goal: Complications of ischemic stroke/TIA will be minimized Outcome: Progressing   Problem: Clinical Measurements: Goal: Respiratory complications will improve Outcome: Progressing   Problem: Pain Managment: Goal: General experience of comfort will improve and/or be controlled Outcome: Progressing   Problem: Education: Goal: Knowledge of disease or condition will improve Outcome: Not Progressing Goal: Knowledge of secondary prevention will improve (MUST DOCUMENT ALL) Outcome: Not Progressing Goal: Knowledge of patient specific risk factors will improve (DELETE if not current risk factor) Outcome: Not Progressing

## 2023-09-03 NOTE — Progress Notes (Addendum)
 NAME:  Whitney [name], MRN:  [id number], DOB:  1955-10-25, LOS: 9 ADMISSION DATE:  08/25/2023, CONSULTATION DATE:  08/31/2023 REFERRING MD:  Dr. Sandria Cruise, CHIEF COMPLAINT: Hypercapnic respiratory failure  History of Present Illness:  Whitney [name] is a 68 year old female with a past medical history significant for Nash cirrhosis, HTN, HLD, pulmonary hypertension, stroke end syndrome, thrombocytopenia in the setting of cirrhosis, type 2 diabetes and anemia who presented to the ED at [facility] 4/20 for complaints of altered mental status. Workup thus far has indicated that AMS is related to hypercapnic respiratory failure for whish PCCM has been called to assist in management .   Pertinent  Medical History  Nash cirrhosis, HTN, HLD, pulmonary hypertension, stroke end syndrome, thrombocytopenia in the setting of cirrhosis, type 2 diabetes and anemia  Significant Hospital Events: Including procedures, antibiotic start and stop dates in addition to other pertinent events   4/20 admitted with altered mental status being this blood gas with PCO2 69.4 4/26 PCCM consulted ABG with pH 7.29 and PCO261, moved to the intensive care.  Placed on NIPPV had not received diuretics since admission 4/27 : Did have a hypoglycemic event requiring dextrose  replacement BNP was 1322 echo cardiogram review shows hyperdynamic LV with grade 3 diastolic dysfunction and elevated LAP bioprosthesis aortic valve in place there was severe TR, severe mitral valve disease and severe PAH with a dilated IVC.,  Limited follow-up echocardiogram continues to show hyperdynamic EF greater than 75% RV moderately enlarged with moderately elevated pulmonary artery systolic pressures, left atrial size dilated estimated right atrial pressure 15 mmHg, 4/28 PICC placed, norepi and later vasopressin started. Ad HF added empiric ceftriaxone  4/29 still w/ some hypercarbia, stopped dex. Cont lasix  gtt and vasopressor support. Volume status much improved.  Cxr improved. Delirium an issue. PT ordered. OOB ordered. Resumed Neurontin    Interim History / Subjective:  Confused. A little agitated at times but seems like gets frustrated easily as well   Objective   Blood pressure 125/85, pulse 97, temperature 98.4 F (36.9 C), temperature source Axillary, resp. rate 20, height 5' 3.75" (1.619 m), weight 111.7 kg, SpO2 99%. CVP:  [5 mmHg-26 mmHg] 10 mmHg  Vent Mode: BIPAP;PCV FiO2 (%):  [40 %] 40 % Set Rate:  [20 bmp] 20 bmp PEEP:  [5 cmH20] 5 cmH20   Intake/Output Summary (Last 24 hours) at 09/03/2023 0801 Last data filed at 09/03/2023 0700 Gross per 24 hour  Intake 1252.79 ml  Output 4375 ml  Net -3122.21 ml   Filed Weights   08/31/23 0418 08/31/23 2124 09/02/23 0500  Weight: 108 kg 111.1 kg 111.7 kg   Examination: General sitting up in bed no distress but a little agitated at times.  HENT NCAT no JVD MMM Pulm crackles both bases. No accessory use on [place]  PCXR w/ improved aeration  Card holosystolic HM Regular rhythm currently  Abd soft Ext warm and dry dependent edema Neuro awake, oriented x 2 but impulsive and at times more confused.  Ancillary Tests Personally Reviewed:    Assessment & Plan:  Acute on chronic hypoxic and hypercapnic respiratory failure with associated metabolic encephalopathy, secondary to volume overload, superimposed on underlying COPD and OSA CXR aeration improving Plan Continuing NIPPV; change to PRN during day and mandatory at HS Continuing diuresis Changed her bronchodilator regimen to Brovana  budesonide and Yupelri Day 2 ceftriaxone  started by advanced heart failure for coverage of aspiration, I do not think she has PNA. Would Rx no longer than 5d Need  to get her OOB  HFpEF (grade III diastolic HF) with valvular heart disease, with prior bioprosthetic aortic valve replacement and moderate MV regurg and stenosis, complicated further by pulmonary hypertension Plan Holding tadalafil  today as directed by  heart failure Weaning Norepi and vasopressin for MAP > 70 (toe ensure RV fxn and CA perfusion) Trend CVP and co-ox Continue telemetry Hypotension preventing goal-directed therapy Trend BNP HF added midodrine  Acute metabolic encephalopathy.  Initially felt to be hypercarbia,  BIPAP seal leak has been challenging.she remains hypercarbic but not convinced this is fully explains confusion. Suspect hospital delirium and renal failure an issue  Plan Dc dex Cont BIPAP HS and PRN  OOB PT/OT Add back her neurontin  at lower dose  May need to consider some night time seroquel  AKI.  Serum creatinine improving. -3 liters yesterday and making sig improvement on total water  balance.  Plan Optimize cardiac output Renal dose meds Cont IV lasix  gtt  Serial chems Strict I&O  History of PAF Plan Telemetry Eliquis   NASH cirrhosis.  Plan Continue nadolol  Intermittent LFTs (order for am)  Hyperglycemia Plan Ssi   Mild thrombocytopenia likely secondary to underlying cirrhosis currently stable Plan Trend CBC; will order for am   H/o Sjogren's syndrome Plan Continuing hydroxychlorine  Best Practice (right click and "Reselect all SmartList Selections" daily)   Diet/type: Regular consistency (see orders) DVT prophylaxis: SCDs Start: 08/25/23 1931 apixaban  (ELIQUIS ) tablet 5 mg   Pressure ulcers present: N/A GI prophylaxis: N/A Lines: Central line Foley:  Yes, and it is still needed Code Status:  full code Last date of multidisciplinary goals of care discussion [pending]    CRITICAL CARE   45 minutes  Performed by: Armstead Bertrand

## 2023-09-04 DIAGNOSIS — J9601 Acute respiratory failure with hypoxia: Secondary | ICD-10-CM | POA: Diagnosis not present

## 2023-09-04 DIAGNOSIS — J9602 Acute respiratory failure with hypercapnia: Secondary | ICD-10-CM | POA: Diagnosis not present

## 2023-09-04 DIAGNOSIS — I272 Pulmonary hypertension, unspecified: Secondary | ICD-10-CM | POA: Diagnosis not present

## 2023-09-04 DIAGNOSIS — I5033 Acute on chronic diastolic (congestive) heart failure: Secondary | ICD-10-CM

## 2023-09-04 DIAGNOSIS — N179 Acute kidney failure, unspecified: Secondary | ICD-10-CM

## 2023-09-04 DIAGNOSIS — G9341 Metabolic encephalopathy: Secondary | ICD-10-CM | POA: Diagnosis not present

## 2023-09-04 LAB — GLUCOSE, CAPILLARY
Glucose-Capillary: 117 mg/dL — ABNORMAL HIGH (ref 70–99)
Glucose-Capillary: 131 mg/dL — ABNORMAL HIGH (ref 70–99)
Glucose-Capillary: 138 mg/dL — ABNORMAL HIGH (ref 70–99)
Glucose-Capillary: 134 mg/dL — ABNORMAL HIGH (ref 70–99)
Glucose-Capillary: 137 mg/dL — ABNORMAL HIGH (ref 70–99)

## 2023-09-04 LAB — COOXEMETRY PANEL
Carboxyhemoglobin: 1.7 % — ABNORMAL HIGH (ref 0.5–1.5)
Methemoglobin: 0.8 % (ref 0.0–1.5)
O2 Saturation: 79.9 %
Total hemoglobin: 13.7 g/dL (ref 12.0–16.0)

## 2023-09-04 LAB — BASIC METABOLIC PANEL WITH GFR
Anion gap: 12 (ref 5–15)
BUN: 57 mg/dL — ABNORMAL HIGH (ref 8–23)
CO2: 30 mmol/L (ref 22–32)
Calcium: 9.1 mg/dL (ref 8.9–10.3)
Chloride: 93 mmol/L — ABNORMAL LOW (ref 98–111)
Creatinine, Ser: 1.58 mg/dL — ABNORMAL HIGH (ref 0.44–1.00)
GFR, Estimated: 35 mL/min — ABNORMAL LOW (ref >60)
Glucose, Bld: 178 mg/dL — ABNORMAL HIGH (ref 70–99)
Potassium: 3.2 mmol/L — ABNORMAL LOW (ref 3.5–5.1)
Sodium: 135 mmol/L (ref 135–145)

## 2023-09-04 LAB — MAGNESIUM: Magnesium: 1.8 mg/dL (ref 1.7–2.4)

## 2023-09-04 LAB — BRAIN NATRIURETIC PEPTIDE: B Natriuretic Peptide: 2677.1 pg/mL — ABNORMAL HIGH (ref 0.0–100.0)

## 2023-09-04 MED ORDER — ACETAZOLAMIDE 250 MG PO TABS
500.0000 mg | ORAL_TABLET | Freq: Two times a day (BID) | ORAL | Status: AC
  Administered 2023-09-04 (×2): 500 mg via ORAL
  Filled 2023-09-04 (×2): qty 2

## 2023-09-04 MED ORDER — POTASSIUM CHLORIDE CRYS ER 20 MEQ PO TBCR
40.0000 meq | EXTENDED_RELEASE_TABLET | ORAL | Status: AC
  Administered 2023-09-04 (×2): 40 meq via ORAL
  Filled 2023-09-04 (×2): qty 2

## 2023-09-04 MED ORDER — FUROSEMIDE 10 MG/ML IJ SOLN
80.0000 mg | Freq: Two times a day (BID) | INTRAMUSCULAR | Status: AC
  Administered 2023-09-04: 80 mg via INTRAVENOUS
  Filled 2023-09-04: qty 8

## 2023-09-04 MED ORDER — FUROSEMIDE 10 MG/ML IJ SOLN: 80.0000 mg | Freq: Two times a day (BID) | INTRAMUSCULAR | Status: DC

## 2023-09-04 MED ORDER — BISACODYL 5 MG PO TBEC: 5.0000 mg | DELAYED_RELEASE_TABLET | Freq: Every day | ORAL | Status: DC | PRN

## 2023-09-04 MED ORDER — FUROSEMIDE 10 MG/ML IJ SOLN
160.0000 mg | Freq: Once | INTRAMUSCULAR | Status: AC
  Administered 2023-09-04: 160 mg via INTRAVENOUS
  Filled 2023-09-04: qty 10

## 2023-09-04 MED ORDER — MAGNESIUM CITRATE PO SOLN
1.0000 | Freq: Once | ORAL | Status: AC
  Administered 2023-09-04: 1 via ORAL
  Filled 2023-09-04: qty 296

## 2023-09-04 MED ORDER — MAGNESIUM SULFATE 2 GM/50ML IV SOLN
2.0000 g | Freq: Once | INTRAVENOUS | Status: AC
  Administered 2023-09-04: 2 g via INTRAVENOUS
  Filled 2023-09-04: qty 50

## 2023-09-04 MED ORDER — POTASSIUM CHLORIDE 10 MEQ/100ML IV SOLN
10.0000 meq | INTRAVENOUS | Status: AC
  Administered 2023-09-04 (×3): 10 meq via INTRAVENOUS
  Filled 2023-09-04 (×2): qty 100

## 2023-09-04 NOTE — Progress Notes (Signed)
 Advanced Heart Failure Rounding Note  Cardiologist: Janelle Mediate, MD  Chief Complaint: Dyspnea Subjective:    On [place]. On NE 6 + VP 0.03. MAP in 80-90s. Coox 80%. CVP 11-12. Negative 2.3L (3.5L UOP). Weight down 10 lbs. sCr 1.8>1.58.   Awake and alert this morning. Breathing much better.   Objective:    Weight Range: 107.1 kg Body mass index is 40.85 kg/m.   Vital Signs:   Temp:  [97.5 F (36.4 C)-98.4 F (36.9 C)] 97.7 F (36.5 C) (04/30 0411) Pulse Rate:  [66-125] 77 (04/30 0630) Resp:  [15-30] 19 (04/30 0630) BP: (90-143)/(53-105) 114/79 (04/30 0630) SpO2:  [70 %-100 %] 98 % (04/30 0630) FiO2 (%):  [40 %] 40 % (04/30 0400) Weight:  [107.1 kg] 107.1 kg (04/30 0500) Last BM Date : 08/29/23  Weight change: Filed Weights   08/31/23 2124 09/02/23 0500 09/04/23 0500  Weight: 111.1 kg 111.7 kg 107.1 kg   Intake/Output:  Intake/Output Summary (Last 24 hours) at 09/04/2023 0659 Last data filed at 09/04/2023 0600 Gross per 24 hour  Intake 1234.99 ml  Output 3875 ml  Net -2640.01 ml    Physical Exam    CVP 11-12 General: Weak appearing. No distress on [place] Cardiac: JVP ~14cm. S1 and S2 present. 2/6 systolic murmur Resp: bases diminished, fine crackles in uppers Abdomen: Soft, non-tender, non-distended.  Extremities: Warm and dry.  No peripheral edema.  Neuro: Alert and oriented x3. Affect pleasant. Generalized weakness Lines/Devices:   RUE PICC  Telemetry   AF in 80-90s (personally reviewed)  EKG    No new ECG to review  Labs    CBC Recent Labs    09/02/23 0225 09/02/23 0326 09/02/23 1550 09/03/23 0351  WBC 5.5  --   --   --   HGB 12.5   < > 12.6 14.3  HCT 39.8   < > 37.0 42.0  MCV 97.3  --   --   --   PLT 86*  --   --   --    < > = values in this interval not displayed.   Basic Metabolic Panel Recent Labs    16/10/96 0320 09/03/23 0351 09/04/23 0323  NA 135 138 135  K 4.3 4.5 3.2*  CL 97*  --  93*  CO2 26  --  30  GLUCOSE 256*  --   178*  BUN 60*  --  57*  CREATININE 1.80*  --  1.58*  CALCIUM  9.0  --  9.1  MG 1.9  --  1.8  PHOS 4.4  --   --    BNP (last 3 results) Recent Labs    09/02/23 1545 09/03/23 0320 09/04/23 0322  BNP 1,408.9* 2,106.9* 2,677.1*   ProBNP (last 3 results) Recent Labs    03/13/23 1428  PROBNP 996.0*   Medications:    Scheduled Medications:  apixaban   5 mg Oral BID   arformoterol   15 mcg Nebulization BID   Chlorhexidine  Gluconate Cloth  6 each Topical Q0600   ezetimibe   10 mg Oral QHS   gabapentin   100 mg Oral TID   hydroxychloroquine   200 mg Oral Daily   insulin  aspart  0-9 Units Subcutaneous TID WC   lidocaine   1 patch Transdermal Q24H   midodrine  10 mg Oral TID WC   nadolol   40 mg Oral Q36H   polyethylene glycol  17 g Oral Daily   potassium chloride   40 mEq Oral Q4H   revefenacin  175 mcg  Nebulization Daily   rosuvastatin   10 mg Oral Daily   senna  2 tablet Oral Daily   sodium chloride  flush  10-40 mL Intracatheter Q12H   Infusions:  magnesium  sulfate bolus IVPB 2 g (09/04/23 0611)   norepinephrine (LEVOPHED) Adult infusion 6 mcg/min (09/04/23 0600)   vasopressin 0.03 Units/min (09/04/23 0600)    PRN Medications: acetaminophen  **OR** acetaminophen , albuterol , antiseptic oral rinse, melatonin, naLOXone  (NARCAN )  injection, ondansetron  (ZOFRAN ) IV, mouth rinse, sodium chloride  flush  Patient Profile   Whitney [name] is a 33-year-old female with a complex past medical history of Sjogren syndrome, cirrhosis, rheumatic heart disease with bioprosthetic AVR, mitral valve disease, pulmonary hypertension.    Assessment/Plan  HFpEF Pulmonary hypertension - RHC in 2024 most consistent with precapillary PH, started on tadalafil . Done after diuresis, and suspect this underestimates her group II component, especially given mitral valve disease - Does have a large component of portopulmonary HTN, especially given high cardiac output - Echo this admission: EF >75%, elevated PASP,  severe mitral annular calcification - Volume up by exam and CVP. Give Lasix  80 mg IV x1 today. Supp K. Follow response.  - On 6 NE + 0.03 Vaso, wean as able. Continue midodrine 10 TID. Suspect liver disease is contributing to hypotension. - GDMT limited by acute exacerbation and hypotension - Poor SGLT2i candidate - Hold tadalfil for now given significant volume overload, hypoxic respiratory failure. Consider restart prior to dc.  Acute on chronic hypoxic and hypercapnic respiratory failure - In setting of COPD, OSA and volume overload - BiPAP PRN and at HS - Covering for aspiration PNA - improving, on [place] 4L this morning. Alert.   AKI - Suspect cardiorenal +/- hepatorenal?  - sCr improving with diuresis. 1.8>1.58 today  Valvular disease  - S/p bioprosthetic AVR, severe MAC - Gradients mildly high for AVR, but otherwise functioning well   Cirrhosis - due to NAFLD - Continue nadolol  40mg  daily, renally dosing per PharmD - Crestor  10 mg daily    PAF:  - Continue eliquis  - in AF 80-90s on tele  Length of Stay: 10  CRITICAL CARE Performed by: [place] Jaimin Krupka  Total critical care time: 15 minutes  Critical care time was exclusive of separately billable procedures and treating other patients.  Critical care was necessary to treat or prevent imminent or life-threatening deterioration.  Critical care was time spent personally by me on the following activities: development of treatment plan with patient and/or surrogate as well as nursing, discussions with consultants, evaluation of patient's response to treatment, examination of patient, obtaining history from patient or surrogate, ordering and performing treatments and interventions, ordering and review of laboratory studies, ordering and review of radiographic studies, pulse oximetry and re-evaluation of patient's condition.   [place] Kaeleb Emond, NP  09/04/2023, 6:59 AM  Advanced Heart Failure Team Pager [phone] (M-F; 7a - 5p)  Please  contact [facility] for night-coverage after hours (5p -7a ) and weekends on [email or web address]

## 2023-09-04 NOTE — Plan of Care (Signed)
  Problem: Ischemic Stroke/TIA Tissue Perfusion: Goal: Complications of ischemic stroke/TIA will be minimized Outcome: Progressing   Problem: Nutrition: Goal: Dietary intake will improve Outcome: Progressing   Problem: Clinical Measurements: Goal: Respiratory complications will improve Outcome: Progressing   Problem: Pain Managment: Goal: General experience of comfort will improve and/or be controlled Outcome: Progressing   Problem: Safety: Goal: Ability to remain free from injury will improve Outcome: Progressing   Problem: Education: Goal: Knowledge of disease or condition will improve Outcome: Not Progressing Goal: Knowledge of secondary prevention will improve (MUST DOCUMENT ALL) Outcome: Not Progressing Goal: Knowledge of patient specific risk factors will improve (DELETE if not current risk factor) Outcome: Not Progressing   Problem: Education: Goal: Knowledge of General Education information will improve Description: Including pain rating scale, medication(s)/side effects and non-pharmacologic comfort measures Outcome: Not Progressing

## 2023-09-04 NOTE — Plan of Care (Signed)
  Problem: Coping: Goal: Ability to adjust to condition or change in health will improve Outcome: Progressing   Problem: Health Behavior/Discharge Planning: Goal: Ability to identify and utilize available resources and services will improve Outcome: Progressing Goal: Ability to manage health-related needs will improve Outcome: Progressing   Problem: Metabolic: Goal: Ability to maintain appropriate glucose levels will improve Outcome: Progressing   Problem: Nutritional: Goal: Maintenance of adequate nutrition will improve Outcome: Progressing Goal: Progress toward achieving an optimal weight will improve Outcome: Progressing   Problem: Skin Integrity: Goal: Risk for impaired skin integrity will decrease Outcome: Progressing   Problem: Activity: Goal: Risk for activity intolerance will decrease Outcome: Progressing   Problem: Safety: Goal: Ability to remain free from injury will improve Outcome: Progressing   Problem: Skin Integrity: Goal: Risk for impaired skin integrity will decrease Outcome: Progressing

## 2023-09-04 NOTE — Progress Notes (Signed)
 NAME:  Whitney [name], MRN:  [id number], DOB:  [birth year], LOS: 10 ADMISSION DATE:  08/25/2023, CONSULTATION DATE:  08/31/2023 REFERRING MD:  Dr. Sandria Cruise, CHIEF COMPLAINT: Hypercapnic respiratory failure  History of Present Illness:  Whitney [name] is a 68 year old female with a past medical history significant for Nash cirrhosis, HTN, HLD, pulmonary hypertension, stroke end syndrome, thrombocytopenia in the setting of cirrhosis, type 2 diabetes and anemia who presented to the ED at [facility] 4/20 for complaints of altered mental status. Workup thus far has indicated that AMS is related to hypercapnic respiratory failure for whish PCCM has been called to assist in management .   Pertinent  Medical History  Nash cirrhosis, HTN, HLD, pulmonary hypertension, stroke end syndrome, thrombocytopenia in the setting of cirrhosis, type 2 diabetes and anemia  Significant Hospital Events: Including procedures, antibiotic start and stop dates in addition to other pertinent events   4/20 admitted with altered mental status being this blood gas with PCO2 69.4 4/26 PCCM consulted ABG with pH 7.29 and PCO261, moved to the intensive care.  Placed on NIPPV had not received diuretics since admission 4/27 : Did have a hypoglycemic event requiring dextrose  replacement BNP was 1322 echo cardiogram review shows hyperdynamic LV with grade 3 diastolic dysfunction and elevated LAP bioprosthesis aortic valve in place there was severe TR, severe mitral valve disease and severe PAH with a dilated IVC.,  Limited follow-up echocardiogram continues to show hyperdynamic EF greater than 75% RV moderately enlarged with moderately elevated pulmonary artery systolic pressures, left atrial size dilated estimated right atrial pressure 15 mmHg, 4/28 PICC placed, norepi and later vasopressin started. Ad HF added empiric ceftriaxone  4/29 still w/ some hypercarbia, stopped dex. Cont lasix  gtt and vasopressor support. Volume status much  improved. Cxr improved. Delirium an issue. PT ordered. OOB ordered. Resumed Neurontin   4/30 remains on NE, Vaso.  Interim History / Subjective:  Feeling better. Not short of breath after transferring to chair Working well with BiPAP Diuresing well Delirium improving.   Objective   Blood pressure 103/71, pulse 94, temperature (!) 97.4 F (36.3 C), temperature source Axillary, resp. rate 18, height 5' 3.75" (1.619 m), weight 107.1 kg, SpO2 96%. CVP:  [8 mmHg-18 mmHg] 12 mmHg  Vent Mode: BIPAP;PCV FiO2 (%):  [40 %] 40 % Set Rate:  [17 bmp] 17 bmp PEEP:  [5 cmH20] 5 cmH20   Intake/Output Summary (Last 24 hours) at 09/04/2023 0858 Last data filed at 09/04/2023 0800 Gross per 24 hour  Intake 1173.76 ml  Output 3575 ml  Net -2401.24 ml   Filed Weights   08/31/23 2124 09/02/23 0500 09/04/23 0500  Weight: 111.1 kg 111.7 kg 107.1 kg   Examination:  General:  Elderly appearing female in NAD Neuro:  Awake, alert, follows commands.  HEENT:  [place]/AT, PERRL, no JVD Cardiovascular:  IRIR, 3/6 murmur Lungs:  bibasilar crackles.  Abdomen:  Soft, non-distended, non-tender Musculoskeletal:  No acute deformity. No LE edema.  Skin:  Intact, MMM  Labs: K 3.2, Cr 1.58 (improving), Mg 1.8, BNP 2677. CVP 11, Coox 80%, 2.5L negative for the admission.   Ancillary Tests Personally Reviewed:    Assessment & Plan:   Acute on chronic hypoxic and hypercapnic respiratory failure with associated metabolic encephalopathy, secondary to volume overload, superimposed on underlying COPD and OSA Plan BiPAP at bedtime and PRN Bronchodilator regimen - Brovana  and Yupelri CTX off Mobilize/PT/OT  Acute on chronic HFpEF (grade III diastolic HF) with valvular heart disease, with prior bioprosthetic aortic  valve replacement and moderate MV regurg and stenosis, complicated further by pulmonary hypertension Plan Appreciate advanced heart failure consultation.  Weaning Norepi and vasopressin for MAP > 70   Midodrine 10mg  TID Trend CVP and co-ox Continue telemetry Hypotension preventing goal-directed therapy Trend BNP  Acute metabolic encephalopathy.  Initially felt to be hypercarbia,  BIPAP seal leak has been challenging.she remains hypercarbic but not convinced this is fully explains confusion. Suspect hospital delirium and renal failure an issue  Plan Dc dex Cont BIPAP HS and PRN  OOB PT/OT Add back her neurontin  at lower dose  May need to consider some night time seroquel  AKI.  Serum creatinine improving. -3 liters yesterday and making sig improvement on total water  balance.  Plan Optimize cardiac output Renal dose meds IV lasix  diuresis Serial chems Strict I&O  History of PAF Plan Telemetry Eliquis   NASH cirrhosis.  Plan Continue nadolol  Intermittent LFTs (order for am)  Hyperglycemia Plan Ssi   Mild thrombocytopenia likely secondary to underlying cirrhosis currently stable Plan Trend CBC; will order for am   H/o Sjogren's syndrome Plan Continuing hydroxychlorine   Best Practice (right click and "Reselect all SmartList Selections" daily)   Diet/type: Regular consistency (see orders) DVT prophylaxis: SCDs Start: 08/25/23 1931 apixaban  (ELIQUIS ) tablet 5 mg   Pressure ulcers present: N/A GI prophylaxis: N/A Lines: Central line Foley:  Yes, and it is still needed Code Status:  full code Last date of multidisciplinary goals of care discussion [pending]    CRITICAL CARE   41 minutes     Roz Cornelia, AGACNP-BC [facility]  See Amion for personal pager PCCM on call pager [phone] until 7pm. Please call Elink 7p-7a. [phone]  09/04/2023 9:14 AM

## 2023-09-04 NOTE — Plan of Care (Signed)
 No change in mental status from prior shift. 2L [place], Levo titrating per order, Vaso continued. Family were updated by MD at the bedside.  Bed in low position, alarms are on, call bell in reach, continue to monitor.

## 2023-09-04 NOTE — Progress Notes (Signed)
 Speech Language Pathology Treatment: Cognitive-Linquistic  Patient Details Name: Whitney [name] MRN: [id number] DOB: Oct 19, 1955 Today's Date: 09/04/2023 Time: 7846-9629 SLP Time Calculation (min) (ACUTE ONLY): 12 min  Assessment / Plan / Recommendation Clinical Impression  Pt continues to present with expressive and receptive impairments. She makes frequent phonemic errors at the sentence level that increase when she is unsure of the answer. She was oriented x4, but could not consistently match the correct answer to the question asked. She was able to accurately repeat up to six digits. Given concern for language deficits, it is difficult to ascertain cognitive deficits but SLP will continue following for ongoing assessment through functional treatment activities.    HPI HPI: Whitney [name] is a 68 yo female presenting to ED 4/20 with AMS. MRI Brain shows small acute posterior R frontal white matter infarct. Seen by SLP 04/14/22 with WFL oropharyngeal swallow and cognition. PMH includes Sjogren's syndrome, T2DM, fibromyalgia, depression, GERD, history of aortic valve replacement, A-fib on Eliquis , TIA, NASH cirrhosis, pulmonary HTN, 4L [place] at baseline      SLP Plan  Continue with current plan of care      Recommendations for follow up therapy are one component of a multi-disciplinary discharge planning process, led by the attending physician.  Recommendations may be updated based on patient status, additional functional criteria and insurance authorization.    Recommendations                     Oral care BID   Intermittent Supervision/Assistance Aphasia (R47.01);Cognitive communication deficit (B28.413)     Continue with current plan of care     Amil Kale, M.A., CCC-SLP Speech Language Pathology, Acute Rehabilitation Services  Secure Chat preferred [phone]   09/04/2023, 4:41 PM

## 2023-09-04 NOTE — Progress Notes (Signed)
 PT Cancellation Note  Patient Details Name: Whitney [name] MRN: [id number] DOB: [birth year]   Cancelled Treatment:    Reason Eval/Treat Not Completed: (P) Patient declined, no reason specified (Pt and family report she just got back to bed from sitting up in chair a few hours.) Discussed timing of session for following date, between 11am -2pm if possible so family can be there to see how she does. Will continue efforts per PT plan of care as schedule permits.   [name] 09/04/2023, 4:42 PM

## 2023-09-04 NOTE — Progress Notes (Signed)
 [facility] ADULT ICU REPLACEMENT PROTOCOL   The patient does apply for the [facility] Adult ICU Electrolyte Replacment Protocol based on the criteria listed below:   1.Exclusion criteria: TCTS, ECMO, Dialysis, and Myasthenia Gravis patients 2. Is GFR >/= 30 ml/min? Yes.    Patient's GFR today is 35 3. Is SCr </= 2? Yes.   Patient's SCr is 1.58 mg/dL 4. Did SCr increase >/= 0.5 in 24 hours? No. 5.Pt's weight >40kg  Yes.   6. Abnormal electrolyte(s): potassium 3.2, mag 1.8  7. Electrolytes replaced per protocol 8.  Call MD STAT for K+ </= 2.5, Phos </= 1, or Mag </= 1 Physician:  protocol  Marva Sleight 09/04/2023 4:57 AM

## 2023-09-05 DIAGNOSIS — G9340 Encephalopathy, unspecified: Secondary | ICD-10-CM | POA: Diagnosis not present

## 2023-09-05 DIAGNOSIS — J9621 Acute and chronic respiratory failure with hypoxia: Secondary | ICD-10-CM | POA: Diagnosis not present

## 2023-09-05 DIAGNOSIS — J9602 Acute respiratory failure with hypercapnia: Secondary | ICD-10-CM | POA: Diagnosis not present

## 2023-09-05 DIAGNOSIS — J9622 Acute and chronic respiratory failure with hypercapnia: Secondary | ICD-10-CM | POA: Diagnosis not present

## 2023-09-05 DIAGNOSIS — I5033 Acute on chronic diastolic (congestive) heart failure: Secondary | ICD-10-CM | POA: Diagnosis not present

## 2023-09-05 DIAGNOSIS — J9601 Acute respiratory failure with hypoxia: Secondary | ICD-10-CM | POA: Diagnosis not present

## 2023-09-05 DIAGNOSIS — G9341 Metabolic encephalopathy: Secondary | ICD-10-CM | POA: Diagnosis not present

## 2023-09-05 DIAGNOSIS — I272 Pulmonary hypertension, unspecified: Secondary | ICD-10-CM | POA: Diagnosis not present

## 2023-09-05 LAB — COMPREHENSIVE METABOLIC PANEL WITH GFR
ALT: 10 U/L (ref 0–44)
AST: 33 U/L (ref 15–41)
Albumin: 2.5 g/dL — ABNORMAL LOW (ref 3.5–5.0)
Alkaline Phosphatase: 52 U/L (ref 38–126)
Anion gap: 12 (ref 5–15)
BUN: 51 mg/dL — ABNORMAL HIGH (ref 8–23)
CO2: 31 mmol/L (ref 22–32)
Calcium: 9 mg/dL (ref 8.9–10.3)
Chloride: 94 mmol/L — ABNORMAL LOW (ref 98–111)
Creatinine, Ser: 1.59 mg/dL — ABNORMAL HIGH (ref 0.44–1.00)
GFR, Estimated: 35 mL/min — ABNORMAL LOW (ref >60)
Glucose, Bld: 125 mg/dL — ABNORMAL HIGH (ref 70–99)
Potassium: 3.1 mmol/L — ABNORMAL LOW (ref 3.5–5.1)
Sodium: 137 mmol/L (ref 135–145)
Total Bilirubin: 1.6 mg/dL — ABNORMAL HIGH (ref 0.0–1.2)
Total Protein: 5.6 g/dL — ABNORMAL LOW (ref 6.5–8.1)

## 2023-09-05 LAB — GLUCOSE, CAPILLARY
Glucose-Capillary: 109 mg/dL — ABNORMAL HIGH (ref 70–99)
Glucose-Capillary: 136 mg/dL — ABNORMAL HIGH (ref 70–99)
Glucose-Capillary: 147 mg/dL — ABNORMAL HIGH (ref 70–99)
Glucose-Capillary: 127 mg/dL — ABNORMAL HIGH (ref 70–99)

## 2023-09-05 LAB — CBC
HCT: 38.8 % (ref 36.0–46.0)
Hemoglobin: 12.7 g/dL (ref 12.0–15.0)
MCH: 30.5 pg (ref 26.0–34.0)
MCHC: 32.7 g/dL (ref 30.0–36.0)
MCV: 93 fL (ref 80.0–100.0)
Platelets: 53 10*3/uL — ABNORMAL LOW (ref 150–400)
RBC: 4.17 MIL/uL (ref 3.87–5.11)
RDW: 16.4 % — ABNORMAL HIGH (ref 11.5–15.5)
WBC: 5.3 10*3/uL (ref 4.0–10.5)
nRBC: 0 % (ref 0.0–0.2)

## 2023-09-05 LAB — BRAIN NATRIURETIC PEPTIDE: B Natriuretic Peptide: 1540.4 pg/mL — ABNORMAL HIGH (ref 0.0–100.0)

## 2023-09-05 LAB — MAGNESIUM: Magnesium: 2.1 mg/dL (ref 1.7–2.4)

## 2023-09-05 LAB — PHOSPHORUS: Phosphorus: 3.9 mg/dL (ref 2.5–4.6)

## 2023-09-05 MED ORDER — METOCLOPRAMIDE HCL 5 MG/ML IJ SOLN
10.0000 mg | Freq: Once | INTRAMUSCULAR | Status: AC
Start: 2023-09-05 — End: 2023-09-05
  Administered 2023-09-05: 10 mg via INTRAVENOUS
  Filled 2023-09-05: qty 2

## 2023-09-05 MED ORDER — STERILE WATER FOR INJECTION IJ SOLN
INTRAMUSCULAR | Status: AC
Start: 2023-09-05 — End: 2023-09-06
  Filled 2023-09-05: qty 10

## 2023-09-05 MED ORDER — ADULT MULTIVITAMIN W/MINERALS CH
1.0000 | ORAL_TABLET | Freq: Two times a day (BID) | ORAL | Status: DC
  Administered 2023-09-05 – 2023-09-08 (×6): 1 via ORAL
  Filled 2023-09-05 (×6): qty 1

## 2023-09-05 MED ORDER — MIDODRINE HCL 5 MG PO TABS
15.0000 mg | ORAL_TABLET | Freq: Three times a day (TID) | ORAL | Status: DC
  Administered 2023-09-05 – 2023-09-08 (×11): 15 mg via ORAL
  Filled 2023-09-05 (×11): qty 3

## 2023-09-05 MED ORDER — PANTOPRAZOLE SODIUM 40 MG IV SOLR
40.0000 mg | INTRAVENOUS | Status: DC
  Administered 2023-09-05: 40 mg via INTRAVENOUS
  Filled 2023-09-05: qty 10

## 2023-09-05 MED ORDER — POTASSIUM CHLORIDE CRYS ER 20 MEQ PO TBCR
40.0000 meq | EXTENDED_RELEASE_TABLET | Freq: Once | ORAL | Status: AC
  Administered 2023-09-05: 40 meq via ORAL
  Filled 2023-09-05: qty 2

## 2023-09-05 MED ORDER — ENSURE ENLIVE PO LIQD
237.0000 mL | Freq: Two times a day (BID) | ORAL | Status: DC
  Administered 2023-09-06 – 2023-09-07 (×4): 237 mL via ORAL

## 2023-09-05 MED ORDER — ALUM & MAG HYDROXIDE-SIMETH 200-200-20 MG/5ML PO SUSP: 15.0000 mL | Freq: Four times a day (QID) | ORAL | Status: DC | PRN

## 2023-09-05 MED ORDER — POTASSIUM CHLORIDE CRYS ER 20 MEQ PO TBCR: 40.0000 meq | EXTENDED_RELEASE_TABLET | Freq: Four times a day (QID) | ORAL | Status: DC

## 2023-09-05 MED ORDER — POTASSIUM CHLORIDE 10 MEQ/100ML IV SOLN
10.0000 meq | INTRAVENOUS | Status: AC
  Administered 2023-09-05 (×4): 10 meq via INTRAVENOUS
  Filled 2023-09-05 (×2): qty 100

## 2023-09-05 MED ORDER — POTASSIUM CHLORIDE 10 MEQ/50ML IV SOLN
10.0000 meq | INTRAVENOUS | Status: AC
  Administered 2023-09-05 (×6): 10 meq via INTRAVENOUS
  Filled 2023-09-05: qty 50

## 2023-09-05 MED ORDER — SPIRONOLACTONE 12.5 MG HALF TABLET
12.5000 mg | ORAL_TABLET | Freq: Every day | ORAL | Status: DC
  Filled 2023-09-05: qty 1

## 2023-09-05 MED ORDER — ACETAZOLAMIDE SODIUM 500 MG IJ SOLR
500.0000 mg | Freq: Once | INTRAMUSCULAR | Status: AC
Start: 2023-09-05 — End: 2023-09-05
  Administered 2023-09-05: 500 mg via INTRAVENOUS
  Filled 2023-09-05: qty 500

## 2023-09-05 MED ORDER — FUROSEMIDE 10 MG/ML IJ SOLN
120.0000 mg | Freq: Once | INTRAVENOUS | Status: AC
  Administered 2023-09-05: 120 mg via INTRAVENOUS
  Filled 2023-09-05: qty 2

## 2023-09-05 MED ORDER — CALCIUM CARBONATE ANTACID 500 MG PO CHEW
200.0000 mg | CHEWABLE_TABLET | Freq: Three times a day (TID) | ORAL | Status: DC
  Administered 2023-09-05 – 2023-09-08 (×10): 200 mg via ORAL
  Filled 2023-09-05 (×10): qty 1

## 2023-09-05 NOTE — Consult Note (Addendum)
 Physical Medicine and Rehabilitation Consult Reason for Consult: Evaluate appropriateness for Inpatient Rehab Referring Physician: Dr. Marene Shape    HPI: Whitney [name] is a 68 y.o. female with PMHx of  has a past medical history of Allergic rhinitis, Anemia, Arthritis, Asthma, Cirrhosis (HCC) (last albumin  3.3 done at [facility] 06-16-2014 (under care everywhere tab in epic)), Depression, Diabetes mellitus type II, Dyspnea, Fibromyalgia, GERD (gastroesophageal reflux disease), Heart murmur, History of exercise stress test, History of hiatal hernia, History of kidney stones, History of rheumatic fever, Hyperlipidemia, Leukocytopenia, Moderate aortic stenosis, NASH (nonalcoholic steatohepatitis), OSA (obstructive sleep apnea), Pneumonia, Pulmonary hypertension (HCC), Sjogren's syndrome (HCC), and Thrombocytopenia (HCC). . They were admitted to [facility] on 08/25/2023 for AMS, found to have R frontal CVA likely d/t missed Eliquis , per Neuro now likely noncontributory. Of note, she was recently seen in the ED 4/15 for a dog bite, prescribed antibiotics and norco (endorses she never took pain med), and shortly after developed confusion with suspected med/Eliquis  noncompliance for several days. She was resumed on home meds and start on IV ABX for possible meningitis with low grade fevers, but determined less likely etiology of AMS and antibiotics were stopped. Cognition continued to improve and per IM likely etiology of confusion hypercarbia d/t CPAP noncompliance (patient endorses she is compliant normally but a piece of her CPAP broke several weeks ago and required replacement, which just arrived this week). Unfortunately d/t confusion and intermittent CPAP/BIPAP compliance in hospital she became more somnelent 4/26. PCCM was consulted for hypercapnic respiratory failure, and she was transferred to ICU 4/26 to start precedex  for Bipap tolerance. She was treated for acute on chronic HFpEF requiring  diuresis with pressors and midodrine  for BP support. Hospitalization c/b AKI, COPD (4L  baseline), PAF, NASH cirrhosis, hyperglycemia, thrombocytopenia, and Sjogren's syndrome. PM&R was consulted to evaluate appropriateness for IPR admission.   Per chart review and patient interview, she lives in a one level home with 2+2 STE without handrail and a 25+ foot sidewalk uphill to enter. She has 1 small step in her home to get into the kitchen and den. She has 24/7 physical assistance from her son. She is independent to Mod I with a cane/furniture walking for ambulation and independent of ADLs other than driving. Currently, she is STS with min A and RW and ambulate with manual facilitation, limited by AMS. OT she is supervision for bed mobility and Min A for SPT, cognition limiting Adls (ex. Trying to tear open toothpaste).    Review of Systems  Constitutional:  Negative for chills and fever.  HENT:  Negative for hearing loss.   Eyes:  Positive for blurred vision.  Respiratory:  Positive for shortness of breath. Negative for cough.   Cardiovascular:  Negative for chest pain and palpitations.  Gastrointestinal:  Positive for constipation and diarrhea. Negative for abdominal pain, nausea and vomiting.  Genitourinary:  Negative for dysuria.       Incontinence  Musculoskeletal:  Negative for joint pain and myalgias.  Neurological:  Positive for weakness. Negative for dizziness, tremors, sensory change and headaches.  Psychiatric/Behavioral:  The patient is nervous/anxious. The patient does not have insomnia.    Past Medical History:  Diagnosis Date   Allergic rhinitis    Anemia    hx   Arthritis    Asthma    hx yrs ago   Cirrhosis (HCC) last albumin  3.3 done at [facility] 06-16-2014 (under care everywhere tab in epic)   Secondary to Fatty  liver --  followed by hepatology at [facility] (dr Orest Bio)    Depression    Diabetes mellitus type II    type 2 diet conrolled   Dyspnea    Fibromyalgia    GERD  (gastroesophageal reflux disease)    Heart murmur    asymptomatic ---  1989 from rhuematic fever   History of exercise stress test    05-05-2013---  negative bruce ETT given exercise workload,  no ischemia   History of hiatal hernia    History of kidney stones    History of rheumatic fever    1989   Hyperlipidemia    Leukocytopenia    Moderate aortic stenosis    AVA area 1.1cm2---  cardiologist --  dr Armanda Lan, 2014 in epic   NASH (nonalcoholic steatohepatitis)    OSA (obstructive sleep apnea)    was using CPAP before gastric sleeve 2015--  no uses after wt loss   Pneumonia    hx   Pulmonary hypertension (HCC)    Sjogren's syndrome (HCC)    Thrombocytopenia (HCC)    Past Surgical History:  Procedure Laterality Date   AORTIC VALVE REPLACEMENT N/A 12/05/2017   Procedure: AORTIC VALVE REPLACEMENT (AVR) TISSUE VALVE INSPIRIS;  Surgeon: Bartley Lightning, MD;  Location: [facility] OR;  Service: Open Heart Surgery;  Laterality: N/A;   COLONOSCOPY WITH ESOPHAGOGASTRODUODENOSCOPY (EGD)     CYSTOSCOPY WITH RETROGRADE PYELOGRAM, URETEROSCOPY AND STENT PLACEMENT Left 01/21/2015   Procedure: CYSTOSCOPY WITH LEFT  RETROGRADE PYELOGRAM, LEFT URETEROSCOPY AND STENT PLACEMENT;  Surgeon: Christina Coyer, MD;  Location: [facility];  Service: Urology;  Laterality: Left;   CYSTOSCOPY WITH RETROGRADE PYELOGRAM, URETEROSCOPY AND STENT PLACEMENT Bilateral 02/24/2015   Procedure: CYSTOSCOPY WITH RIGHT RETROGRADE PYELOGRAM, BLADDER BIOPSY FULGERATION LEFT URETEROSCOPY AND STENT REPLACEMENT;  Surgeon: Christina Coyer, MD;  Location: [facility];  Service: Urology;  Laterality: Bilateral;   EXPLORATORY LAPAROSCOPY W/  CONE BIOPSY'S LEFT AND RIGHT LOBE OF LIVER  11-04-2007   HOLMIUM LASER APPLICATION Left 02/24/2015   Procedure: HOLMIUM LASER LITHOTRIPSY;  Surgeon: Christina Coyer, MD;  Location: [facility];  Service: Urology;  Laterality: Left;   HYSTEROSCOPY  WITH D & C N/A 12/11/2012   Procedure: DILATATION AND CURETTAGE /HYSTEROSCOPY;  Surgeon: Lizette Righter. Wynona Hedger, MD;  Location: [facility];  Service: Gynecology;  Laterality: N/A;   INGUINAL HERNIA REPAIR Left 10/22/2016   Procedure: LEFT INGUINAL HERNIA REPAIR;  Surgeon: Enid Harry, MD;  Location: [facility] OR;  Service: General;  Laterality: Left;  TAP BLOCK   INSERTION OF MESH Left 10/22/2016   Procedure: INSERTION OF MESH;  Surgeon: Enid Harry, MD;  Location: [facility] OR;  Service: General;  Laterality: Left;   LAPAROSCOPIC GASTRIC SLEEVE RESECTION  07-27-2013   LOOP RECORDER INSERTION N/A 04/16/2022   Procedure: LOOP RECORDER INSERTION;  Surgeon: Verona Goodwill, MD;  Location: [facility] INVASIVE CV LAB;  Service: Cardiovascular;  Laterality: N/A;   PUBOVAGINAL SLING  04-10-2001   [facility]   RIGHT HEART CATH N/A 04/11/2023   Procedure: RIGHT HEART CATH;  Surgeon: Darlis Eisenmenger, MD;  Location: [facility] INVASIVE CV LAB;  Service: Cardiovascular;  Laterality: N/A;   RIGHT/LEFT HEART CATH AND CORONARY ANGIOGRAPHY N/A 08/23/2017   Procedure: RIGHT/LEFT HEART CATH AND CORONARY ANGIOGRAPHY;  Surgeon: Lucendia Rusk, MD;  Location: [facility] INVASIVE CV LAB;  Service: Cardiovascular;  Laterality: N/A;   TEE WITHOUT CARDIOVERSION N/A 12/05/2017   Procedure: TRANSESOPHAGEAL ECHOCARDIOGRAM (TEE);  Surgeon: Bartley Lightning, MD;  Location: [facility] OR;  Service: Open Heart Surgery;  Laterality: N/A;   TONSILLECTOMY  1975   TRANSTHORACIC ECHOCARDIOGRAM  06-04-2012  dr Jacquelynn Matter   mild LVH,  grade I diastolic dysfunction/  ef 60-65%/  moderate LAE/  mild MV calcifation without stenosis,  mild MR/  moderate AV stenosis,  cannot r/o bicupsid, area 1.1cm2/  mild dilated aortic root/  trivial TR   Family History  Problem Relation Age of Onset   Alzheimer's disease Father    Hip fracture Father    Asthma Father    Heart disease Father    Heart attack Father    Hypertension Father    Rheum arthritis Mother    Heart disease Mother     Allergies Daughter    Asthma Paternal Grandmother    Asthma Daughter    Stroke Neg Hx    Social History:  reports that she has never smoked. She has been exposed to tobacco smoke. She has never used smokeless tobacco. She reports that she does not currently use alcohol. She reports that she does not use drugs. Allergies:  Allergies  Allergen Reactions   Prednisone      Other Reaction(s): delerium   Doxycycline Hives and Rash   Naproxen Rash and Hives   Medications Prior to Admission  Medication Sig Dispense Refill   albuterol  (PROVENTIL ) (2.5 MG/3ML) 0.083% nebulizer solution Take 3 mLs (2.5 mg total) by nebulization every 6 (six) hours as needed for wheezing or shortness of breath. 75 mL 5   albuterol  (VENTOLIN  HFA) 108 (90 Base) MCG/ACT inhaler INHALE 2 PUFFS INTO THE LUNGS EVERY 4 HOURS AS NEEDED FOR WHEEZING OR SHORTNESS OF BREATH. 8.5 each 5   amoxicillin -clavulanate (AUGMENTIN ) 875-125 MG tablet Take 1 tablet by mouth every 12 (twelve) hours. 14 tablet 0   ELIQUIS  5 MG TABS tablet TAKE 1 TABLET BY MOUTH TWICE A DAY 90 tablet 2   Evolocumab  (REPATHA  SURECLICK) 140 MG/ML SOAJ Inject 140 mg into the skin every 14 (fourteen) days. 6 mL 3   ezetimibe  (ZETIA ) 10 MG tablet Take 10 mg by mouth at bedtime.   11   FARXIGA  10 MG TABS tablet Take 1 tablet (10 mg total) by mouth daily before breakfast. 90 tablet 3   furosemide  (LASIX ) 20 MG tablet Take 2 tablets (40 mg total) by mouth daily. 90 tablet 3   gabapentin  (NEURONTIN ) 100 MG capsule Take 1 capsule (100 mg total) by mouth 2 (two) times daily at 10 am and 4 pm AND 3 capsules (300 mg total) at bedtime. 150 capsule 5   hydroxychloroquine  (PLAQUENIL ) 200 MG tablet Take 1 tablet (200 mg total) by mouth daily. 30 tablet 2   MAGNESIUM  GLUCONATE PO Take 1 tablet by mouth daily.     nadolol  (CORGARD ) 40 MG tablet TAKE 1 TABLET BY MOUTH EVERY DAY 30 tablet 0   oxyCODONE -acetaminophen  (PERCOCET/ROXICET) 5-325 MG tablet Take 1 tablet by mouth every  6 (six) hours as needed for severe pain (pain score 7-10). 15 tablet 0   OXYGEN  Inhale 3 L into the lungs daily.     OZEMPIC, 1 MG/DOSE, 4 MG/3ML SOPN INJECT 1 MG SUBCUTANEOUSLY ONE TIME PER WEEK 84 DAYS     potassium chloride  SA (KLOR-CON  M) 20 MEQ tablet Take 1 tablet (20 mEq total) by mouth daily. 90 tablet 3   tadalafil , PAH, (ADCIRCA ) 20 MG tablet Take 2 tablets (40 mg total) by mouth daily. 60 tablet 11   Tiotropium Bromide-Olodaterol (STIOLTO RESPIMAT ) 2.5-2.5 MCG/ACT AERS INHALE  2 PUFFS INTO THE LUNGS DAILY. INHALE 2 PUFFS BY MOUTH INTO THE LUNGS DAILY 12 g 0    Home: Home Living Family/patient expects to be discharged to:: Private residence Living Arrangements: Children Available Help at Discharge: Family, Available 24 hours/day Type of Home: House Home Access: Stairs to enter [employer] of Steps: 2 steps + 2 steps Entrance Stairs-Rails: None Home Layout: One level, Laundry or work area in basement [employer] Shower/Tub: Hydrographic surveyor, Sport and exercise psychologist: Standard Bathroom Accessibility: Yes Home Equipment: Grab bars - toilet, Grab bars - tub/shower, Information systems manager, Rollator (4 wheels)  Lives With: Son  Functional History: Prior Function Prior Level of Function : Driving, Independent/Modified Independent Mobility Comments: pt reports typically ambulating without DME, does utilize rollator PRN ADLs Comments: Manages her own meds, cooks, home mgt;very independent. Caretaking for her son at times driving him to doctors appointments Functional Status:  Mobility: Bed Mobility Overal bed mobility: Needs Assistance Bed Mobility: Sit to Supine Supine to sit: Supervision, HOB elevated, Used rails Sit to supine: Contact guard assist, Used rails General bed mobility comments: increased time and cues for sequencing. Transfers Overall transfer level: Needs assistance Equipment used: Rolling walker (2 wheels) Transfers: Sit to/from Stand, Bed to chair/wheelchair/BSC Sit  to Stand: Min assist Bed to/from chair/wheelchair/BSC transfer type:: Step pivot Step pivot transfers: Min assist General transfer comment: pt needed manual facilitation for problem solving turning around with RW Ambulation/Gait Ambulation/Gait assistance: Min assist Gait Distance (Feet): 4 Feet Assistive device: Rolling walker (2 wheels) Gait Pattern/deviations: Decreased stride length, Step-through pattern, Staggering left, Staggering right, Wide base of support General Gait Details: 1 LOB with stepping, in part due to pt's confusion with task. Min A to recover balance and RW Gait velocity: decreased Gait velocity interpretation: <1.31 ft/sec, indicative of household ambulator Stairs: Yes Stairs assistance: Contact guard assist Stair Management: Two rails, Alternating pattern, Forwards Number of Stairs: 2 General stair comments: no LOB noted.    ADL: ADL Overall ADL's : Needs assistance/impaired Eating/Feeding: Independent Grooming: Oral care, Moderate assistance, Sitting Grooming Details (indicate cue type and reason): HOHA and visual demo of how to untwist toothapste cap to brush teeth Upper Body Bathing: Supervision/ safety, Sitting Lower Body Bathing: Minimal assistance, Sit to/from stand Upper Body Dressing : Supervision/safety, Sitting Lower Body Dressing: Minimal assistance, Sit to/from stand Toilet Transfer: Minimal assistance, Stand-pivot, Rolling walker (2 wheels), BSC/3in1 Toilet Transfer Details (indicate cue type and reason): simulated via functional mobility Toileting- Clothing Manipulation and Hygiene: Minimal assistance, Sit to/from stand Functional mobility during ADLs: Minimal assistance, Rolling walker (2 wheels) General ADL Comments: Focused session on cog assessment, Discussed with pt family the need for supervision during ADLs/iADLs and mobility as well as assist with medication management and finances  Cognition: Cognition Overall Cognitive Status:  Difficult to assess Orientation Level: Oriented to person, Oriented to place, Disoriented to time, Disoriented to situation Cognition Arousal: Alert Behavior During Therapy: [facility] for tasks assessed/performed Overall Cognitive Status: Difficult to assess  Blood pressure (!) 89/66, pulse 76, temperature 97.6 F (36.4 C), temperature source Oral, resp. rate 20, height 5' 3.75" (1.619 m), weight 106 kg, SpO2 98%. Physical Exam Constitutional: No apparent distress. Appropriate appearance for age. +obese HENT: No JVD. Neck Supple. Trachea midline. Atraumatic, normocephalic. Eyes: PERRLA. EOMI. Visual fields grossly intact.  Cardiovascular: Irregular rhythm, regular rate. trace Edema. Peripheral pulses 2+  Respiratory: Reduced, CTAB. No rales, rhonchi, or wheezing. On 4 L [place]  Abdomen: + bowel sounds, hypoactive. No distention or tenderness.  GU:  Not examined. +Foley, draining clear urine.  Skin: C/D/I. No apparent lesions. RUE IV intact. Various healing UE bruises.  + Bruise from CPAP on nasal bridge  MSK:      No apparent deformity. PROM all 4 extremities grossly intact.        Neurologic exam:  Cognition: AAO to person, place, time and event.  Language: Fluent, No substitutions or neoglisms. No dysarthria. Names 3/3 objects correctly.  Memory: Recalls 3/3 objects at 5 minutes. No apparent deficits  Insight: Good insight into current condition.  Mood: Pleasant affect, appropriate mood.  Sensation: To light touch reduced in BL lower extremities to mid-thigh Reflexes: 1+ in BL UE and LEs. Negative Hoffman's and babinski signs bilaterally.  CN: + L facial droop Coordination: + BL UE ataxia on FTN, none on HTS bilaterally.  Spasticity: MAS 0 in all extremities.       Strength:                RUE: 4/5 SA, 4/5 EF, 4/5 EE, 4/5 WE, 4/5 FF, 4/5 FA                LUE:  4/5 SA, 4/5 EF, 4/5 EE, 4/5 WE, 4/5 FF, 4/5 FA                RLE: 1+/5 HF, 3/5 KE, 4/5  DF, 4/5  EHL, 4/5  PF                  LLE:  1+/5 HF, 3/5 KE, 4/5  DF, 4/5  EHL, 4/5  PF     Results for orders placed or performed during the hospital encounter of 08/25/23 (from the past 24 hours)  Glucose, capillary     Status: Abnormal   Collection Time: 09/04/23  3:29 PM  Result Value Ref Range   Glucose-Capillary 134 (H) 70 - 99 mg/dL  Glucose, capillary     Status: Abnormal   Collection Time: 09/04/23 10:05 PM  Result Value Ref Range   Glucose-Capillary 137 (H) 70 - 99 mg/dL  Magnesium      Status: None   Collection Time: 09/05/23  4:19 AM  Result Value Ref Range   Magnesium  2.1 1.7 - 2.4 mg/dL  Brain natriuretic peptide     Status: Abnormal   Collection Time: 09/05/23  4:19 AM  Result Value Ref Range   B Natriuretic Peptide 1,540.4 (H) 0.0 - 100.0 pg/mL  CBC     Status: Abnormal   Collection Time: 09/05/23  4:19 AM  Result Value Ref Range   WBC 5.3 4.0 - 10.5 K/uL   RBC 4.17 3.87 - 5.11 MIL/uL   Hemoglobin 12.7 12.0 - 15.0 g/dL   HCT 16.1 09.6 - 04.5 %   MCV 93.0 80.0 - 100.0 fL   MCH 30.5 26.0 - 34.0 pg   MCHC 32.7 30.0 - 36.0 g/dL   RDW 40.9 (H) 81.1 - 91.4 %   Platelets 53 (L) 150 - 400 K/uL   nRBC 0.0 0.0 - 0.2 %  Comprehensive metabolic panel with GFR     Status: Abnormal   Collection Time: 09/05/23  4:19 AM  Result Value Ref Range   Sodium 137 135 - 145 mmol/L   Potassium 3.1 (L) 3.5 - 5.1 mmol/L   Chloride 94 (L) 98 - 111 mmol/L   CO2 31 22 - 32 mmol/L   Glucose, Bld 125 (H) 70 - 99 mg/dL   BUN 51 (H) 8 - 23 mg/dL  Creatinine, Ser 1.59 (H) 0.44 - 1.00 mg/dL   Calcium  9.0 8.9 - 10.3 mg/dL   Total Protein 5.6 (L) 6.5 - 8.1 g/dL   Albumin  2.5 (L) 3.5 - 5.0 g/dL   AST 33 15 - 41 U/L   ALT 10 0 - 44 U/L   Alkaline Phosphatase 52 38 - 126 U/L   Total Bilirubin 1.6 (H) 0.0 - 1.2 mg/dL   GFR, Estimated 35 (L) >60 mL/min   Anion gap 12 5 - 15  Phosphorus     Status: None   Collection Time: 09/05/23  4:19 AM  Result Value Ref Range   Phosphorus 3.9 2.5 - 4.6 mg/dL  Glucose, capillary      Status: Abnormal   Collection Time: 09/05/23  7:27 AM  Result Value Ref Range   Glucose-Capillary 109 (H) 70 - 99 mg/dL  Glucose, capillary     Status: Abnormal   Collection Time: 09/05/23 11:30 AM  Result Value Ref Range   Glucose-Capillary 136 (H) 70 - 99 mg/dL   No results found.  Assessment/Plan: Diagnosis: Encephalopathy due to hypercapnic respiratory failure, multifactorial etiology Does the need for close, 24 hr/day medical supervision in concert with the patient's rehab needs make it unreasonable for this patient to be served in a less intensive setting? Yes Co-Morbidities requiring supervision/potential complications: Encephalopathy, incontinence, obesity, aeCHF, hypotension, AKI, COPD (4L [place] baseline), OSA on CPAP/Bipap, PAF, NASH cirrhosis, hyperglycemia, thrombocytopenia, and Sjogren's syndrome Due to bladder management, bowel management, safety, skin/wound care, disease management, medication administration, pain management, and patient education, does the patient require 24 hr/day rehab nursing? Yes Does the patient require coordinated care of a physician, rehab nurse, therapy disciplines of PT, OT, and SLP to address physical and functional deficits in the context of the above medical diagnosis(es)? Yes Addressing deficits in the following areas: balance, endurance, locomotion, strength, transferring, bowel/bladder control, bathing, dressing, feeding, grooming, toileting, cognition, and psychosocial support Can the patient actively participate in an intensive therapy program of at least 3 hrs of therapy per day at least 5 days per week? Yes The potential for patient to make measurable gains while on inpatient rehab is good Anticipated functional outcomes upon discharge from inpatient rehab are supervision  with PT, supervision with OT, independent with SLP. Estimated rehab length of stay to reach the above functional goals is: 10-14 days Anticipated discharge destination:  Home Overall Rehab/Functional Prognosis: good  POST ACUTE RECOMMENDATIONS: This patient's condition is appropriate for continued rehabilitative care in the following setting: Not currently medically ready, once stable likely appropriate for CIR Patient has agreed to participate in recommended program. Yes Note that insurance prior authorization may be required for reimbursement for recommended care.  Comment: Whitney [name] is a 68 year old female presenting to [facility] for encephalopathy secondary to multifactorial respiratory failure and hypercarbia.  She currently is not medically stable for inpatient rehab, given she remains on multiple pressors for blood pressure support, but would be a good candidate for inpatient rehab once medically ready.  She has 24/7 family assistance from her son who can provide some physical support, and will require significant intensive rehabilitation to navigate the long sidewalk and steps leading up to and into her home, as well as the few steps inside her home.  Her cognition is significantly improved, and she is motivated to participate in therapies.   MEDICAL RECOMMENDATIONS: Possible barrier to inpatient rehab will be respiratory stability; unit may accommodate stable routine BiPAP overnight but cannot accommodate intermittent BiPAP/as needed.  Will additionally need to coordinate plan for home unit prior to discharge.   I have personally performed a face to face diagnostic evaluation of this patient. Additionally, I have examined the patient's medical record including any pertinent labs and radiographic images. If the physician assistant has documented in this note, I have reviewed and edited or otherwise concur with the physician assistant's documentation.  Thanks,  [name] Lime, DO 09/05/2023

## 2023-09-05 NOTE — Progress Notes (Signed)
 PT Cancellation Note  Patient Details Name: Whitney [name] MRN: [id number] DOB: December 06, 1955   Cancelled Treatment:    Reason Eval/Treat Not Completed: (P) Other (comment) (pt meeting with RD in room. BP also remains low, RN entering room to address BP medications prior to attempting OOB therapies.) Will continue efforts per PT plan of care later in day as schedule permits.   Whitney [name] Whitney [name] Height 09/05/2023, 12:10 PM

## 2023-09-05 NOTE — Progress Notes (Signed)
 Initial Nutrition Assessment  DOCUMENTATION CODES:   Obesity unspecified  INTERVENTION:  Liberalize diet to regular to provide wider variety of menu options ot promote adequate PO intake  Encouraged family to bring food from home that pt is familiar with and may enjoy to optimize oral intake [place] yogurt on all meal trays Ensure Enlive po BID, each supplement provides 350 kcal and 20 grams of protein. In hospital bariatric surgery vitamin regimen: MVI BID and 500mg  TUMS TID  NUTRITION DIAGNOSIS:   Inadequate oral intake related to acute illness, poor appetite as evidenced by per patient/family report.  GOAL:   Patient will meet greater than or equal to 90% of their needs  MONITOR:   PO intake, Supplement acceptance, Diet advancement, Labs, Weight trends, I & O's  REASON FOR ASSESSMENT:   Rounds    ASSESSMENT:   Pt admitted with c/o AMS r/t hypercapnic respiratory failure. PMH significant for NASH cirrhosis, HTN, HLD, pulmonary HTN, Sjogren's syndrome, thrombocytopenia, DM2, anemia.  Pt discussed in IDT rounds. RN mentions pt's appetite and PO intake has been very poor.   Spoke with pt and her son at bedside. He is concerned with her nutritional status as she has had a very poor appetite during admission. This morning pt only consumed half a banana for breakfast.  Pt reports that she had a gastric sleeve surgery in 2015. Following this procedure her son mentions that she followed her diet and vitamin recommendations for a very long time. However she has since stopped taking her multivitamins. She had a good appetite at home but mentions that she has had altered taste x 4 weeks. She is not fond of breakfast but will often eat greek yogurt with protein granola for breakfast as she feels she needs something for the energy. Throughout the day she enjoys eating a variety of fruits and vegetables. Dinner is when she will typically have a meat for protein. She also loves to bake so  often has desserts on hand.   Pt reports that her bowels are usually regular. Denies any diarrhea. She mentions that she has never experienced constipation the way she has since she has been admitted. This is now resolved d/t ordered bowel regimen.   Pt states that her weight has been fairly stable though mentions that within a week of multiple recent doctor's appointments, she has been frustrated over the significant fluctuations.   Notably pt has been volume overloaded on lasix  and diamox  for diuresis throughout admission.  Admit weight: 105.2 kg Current weight: 106 kg  Medications: SSI 0-9 units TID, protonix , miralax  daily, senna daily Drips:  Levo @ 2 mcg/min Potassium chloride  Vaso @ 0.03 units  Labs:  Potassium 3.1 BUN 51 Cr 1.59 GFR 35 CBG's 109-147 x24 hours HgbA1c 5.1% (4/21)  UOP: x24 hours I/O's: - since admit  NUTRITION - FOCUSED PHYSICAL EXAM:  Flowsheet Row Most Recent Value  Orbital Region No depletion  Upper Arm Region No depletion  Thoracic and Lumbar Region No depletion  Buccal Region No depletion  Temple Region No depletion  Clavicle Bone Region No depletion  Clavicle and Acromion Bone Region No depletion  Scapular Bone Region No depletion  Dorsal Hand Mild depletion  Patellar Region Unable to assess  Anterior Thigh Region Unable to assess  [mild pitting edema]  Posterior Calf Region Moderate depletion  Edema (RD Assessment) Mild  Hair Reviewed  Eyes Reviewed  Mouth Reviewed  Skin Reviewed  Nails Reviewed    Diet Order:   Diet  Order             Diet regular Room service appropriate? Yes; Fluid consistency: Thin  Diet effective now                   EDUCATION NEEDS:   Education needs have been addressed  Skin:  Skin Assessment: Skin Integrity Issues: Skin Integrity Issues:: Stage I Stage I: nose  Last BM:  5/1 x4 type 6/7  Height:   Ht Readings from Last 1 Encounters:  08/27/23 5' 3.75" (1.619 m)    Weight:    Wt Readings from Last 1 Encounters:  09/05/23 106 kg    Ideal Body Weight:  54.5 kg  BMI:  Body mass index is 40.43 kg/m.  Estimated Nutritional Needs:   Kcal:  1500-1700  Protein:  75-90g  Fluid:  >/=1.5L  Rocklin Chute, RDN, LDN Clinical Nutrition See AMiON for contact information.

## 2023-09-05 NOTE — Progress Notes (Addendum)
 Advanced Heart Failure Rounding Note  Cardiologist: Janelle Mediate, MD   Chief Complaint: HFpEF  Subjective:    Pressor requirement improving with addition of midodrine .   Currently on 2 NE + 0.04 Vaso.   CVP 9. 2.7L UOP last 24 hrs with IV lasix  + diamox .  Awake and conversant, sitting up in bed.   Objective:    Weight Range: 106 kg Body mass index is 40.43 kg/m.   Vital Signs:   Temp:  [97.5 F (36.4 C)-98.1 F (36.7 C)] 97.7 F (36.5 C) (05/01 0725) Pulse Rate:  [61-130] 82 (05/01 0645) Resp:  [13-27] 17 (05/01 0645) BP: (80-134)/(52-99) 99/68 (05/01 0645) SpO2:  [90 %-100 %] 98 % (05/01 0745) FiO2 (%):  [40 %] 40 % (05/01 0600) Weight:  [106 kg] 106 kg (05/01 0500) Last BM Date : 09/04/23  Weight change: Filed Weights   09/02/23 0500 09/04/23 0500 09/05/23 0500  Weight: 111.7 kg 107.1 kg 106 kg   Intake/Output:  Intake/Output Summary (Last 24 hours) at 09/05/2023 0843 Last data filed at 09/05/2023 0600 Gross per 24 hour  Intake 1101.76 ml  Output 2615 ml  Net -1513.24 ml    Physical Exam    General:  Chronically ill appearing Neck: JVP 8-10 Cor: Irregularly irregular rhythm. No rubs, gallops or murmurs. Lungs: breathing nonlabored Abdomen: obese, soft, nontender, nondistended.  Extremities: trace edema Neuro: alert & orientedx3. Affect pleasant   Telemetry   AF 70s-80s  Labs    CBC Recent Labs    09/03/23 0351 09/05/23 0419  WBC  --  5.3  HGB 14.3 12.7  HCT 42.0 38.8  MCV  --  93.0  PLT  --  53*   Basic Metabolic Panel Recent Labs    65/78/46 0320 09/03/23 0351 09/04/23 0323 09/05/23 0419  NA 135   < > 135 137  K 4.3   < > 3.2* 3.1*  CL 97*  --  93* 94*  CO2 26  --  30 31  GLUCOSE 256*  --  178* 125*  BUN 60*  --  57* 51*  CREATININE 1.80*  --  1.58* 1.59*  CALCIUM  9.0  --  9.1 9.0  MG 1.9  --  1.8 2.1  PHOS 4.4  --   --  3.9   < > = values in this interval not displayed.   BNP (last 3 results) Recent Labs     09/03/23 0320 09/04/23 0322 09/05/23 0419  BNP 2,106.9* 9,629.5* 1,540.4*   ProBNP (last 3 results) Recent Labs    03/13/23 1428  PROBNP 996.0*   Medications:    Scheduled Medications:  apixaban   5 mg Oral BID   arformoterol   15 mcg Nebulization BID   Chlorhexidine  Gluconate Cloth  6 each Topical Q0600   ezetimibe   10 mg Oral QHS   gabapentin   100 mg Oral TID   hydroxychloroquine   200 mg Oral Daily   insulin  aspart  0-9 Units Subcutaneous TID WC   lidocaine   1 patch Transdermal Q24H   midodrine   10 mg Oral TID WC   nadolol   40 mg Oral Q36H   polyethylene glycol  17 g Oral Daily   potassium chloride   40 mEq Oral Q6H   revefenacin   175 mcg Nebulization Daily   rosuvastatin   10 mg Oral Daily   senna  2 tablet Oral Daily   sodium chloride  flush  10-40 mL Intracatheter Q12H   Infusions:  norepinephrine  (LEVOPHED ) Adult infusion 2 mcg/min (09/05/23 0600)  potassium chloride      vasopressin  0.03 Units/min (09/05/23 0600)    PRN Medications: acetaminophen  **OR** acetaminophen , albuterol , antiseptic oral rinse, bisacodyl , melatonin, naLOXone  (NARCAN )  injection, ondansetron  (ZOFRAN ) IV, mouth rinse, sodium chloride  flush  Patient Profile   BERNEDETTE INSINGA is a 68-year-old female with a complex past medical history of Sjogren syndrome, cirrhosis, rheumatic heart disease with bioprosthetic AVR, mitral valve disease, pulmonary hypertension.    Assessment/Plan  HFpEF Pulmonary hypertension - RHC in 2024 most consistent with precapillary PH, started on tadalafil . Done after diuresis, and suspect this underestimates her group II component, especially given mitral valve disease - Does have a large component of portopulmonary HTN, especially given high cardiac output - Echo this admission: EF >75%, RV moderately enlarged, RVSP 51 mmHg, severe LAE - On 2 NE + 0.03 Vaso, wean as able. Increase midodrine  to 15 TID. Suspect liver disease is contributing to hypotension. - Volume  improving but still up. Give 120 mg lasix  IV X 1 and diamox  500 mg PO today. Hopefully start po diuretic tomorrow. - GDMT limited by acute exacerbation and hypotension - Poor SGLT2i candidate - Continue to hold tadalafil  for now w/ volume overload and respiratory failure. Can consider restarting prior to discharge.  Acute on chronic hypoxic and hypercapnic respiratory failure - In setting of COPD, OSA and volume overload - BiPAP PRN and at HS - Covering for aspiration PNA - improving, on [place]mer[place] 2L this morning. Alert.   AKI - Suspect mostly hepatorenal with component of cardiorenal - sCr improving with diuresis. 1.8>1.58>1.59  Valvular disease  - S/p bioprosthetic AVR, severe MAC - Gradients mildly high for AVR, but otherwise functioning well   Cirrhosis - due to NAFLD - On nadolol  40mg  daily, renally dosing per PharmD - Crestor  10 mg daily    PAF:  - Continue eliquis  - in AF with controlled rate on telemetry  Needs to get out of bed today.  Will ask HF TOC to see. BiPAP tubing broke prior to admission and off BiPAP for 2 weeks. Will see if DME can be ordered while here.  Length of Stay: 11  CRITICAL CARE Performed by: Gerilyn Kobus N   Total critical care time: 15 minutes  Critical care time was exclusive of separately billable procedures and treating other patients.  Critical care was necessary to treat or prevent imminent or life-threatening deterioration.  Critical care was time spent personally by me on the following activities: development of treatment plan with patient and/or surrogate as well as nursing, discussions with consultants, evaluation of patient's response to treatment, examination of patient, obtaining history from patient or surrogate, ordering and performing treatments and interventions, ordering and review of laboratory studies, ordering and review of radiographic studies, pulse oximetry and re-evaluation of patient's condition.   Malaika Arnall N,  PA-C  09/05/2023, 8:43 AM  Advanced Heart Failure Team Pager [phone] (M-F; 7a - 5p)  Please contact [facility] for night-coverage after hours (5p -7a ) and weekends on [email or web address]

## 2023-09-05 NOTE — Progress Notes (Addendum)
 NAME:  Whitney [name], MRN:  [id number], DOB:  1956/05/05, LOS: 11 ADMISSION DATE:  08/25/2023, CONSULTATION DATE:  08/31/2023 REFERRING MD:  Dr. Sandria Cruise, CHIEF COMPLAINT: Hypercapnic respiratory failure  History of Present Illness:  Whitney [name] is a 68 year old female with a past medical history significant for Nash cirrhosis, HTN, HLD, pulmonary hypertension, stroke end syndrome, thrombocytopenia in the setting of cirrhosis, type 2 diabetes and anemia who presented to the ED at [facility] 4/20 for complaints of altered mental status. Workup thus far has indicated that AMS is related to hypercapnic respiratory failure for whish PCCM has been called to assist in management .   Pertinent  Medical History  Nash cirrhosis, HTN, HLD, pulmonary hypertension, stroke end syndrome, thrombocytopenia in the setting of cirrhosis, type 2 diabetes and anemia  Significant Hospital Events: Including procedures, antibiotic start and stop dates in addition to other pertinent events   4/20 admitted with altered mental status being this blood gas with PCO2 69.4 4/26 PCCM consulted ABG with pH 7.29 and PCO261, moved to the intensive care.  Placed on NIPPV had not received diuretics since admission 4/27 : Did have a hypoglycemic event requiring dextrose  replacement BNP was 1322 echo cardiogram review shows hyperdynamic LV with grade 3 diastolic dysfunction and elevated LAP bioprosthesis aortic valve in place there was severe TR, severe mitral valve disease and severe PAH with a dilated IVC.,  Limited follow-up echocardiogram continues to show hyperdynamic EF greater than 75% RV moderately enlarged with moderately elevated pulmonary artery systolic pressures, left atrial size dilated estimated right atrial pressure 15 mmHg, 4/28 PICC placed, norepi and later vasopressin  started. Ad HF added empiric ceftriaxone  4/29 still w/ some hypercarbia, stopped dex. Cont lasix  gtt and vasopressor support. Volume status much  improved. Cxr improved. Delirium an issue. PT ordered. OOB ordered. Resumed Neurontin   5/1 remains on NE, Vaso.  Interim History / Subjective:  No complaints Using BiPAP overnight without issue Diuresing well NE down to 2 mcg CVP 12, coox 80  Objective   Blood pressure 99/68, pulse 82, temperature 97.7 F (36.5 C), temperature source Oral, resp. rate 17, height 5' 3.75" (1.619 m), weight 106 kg, SpO2 98%. CVP:  [11 mmHg-19 mmHg] 18 mmHg  Vent Mode: BIPAP FiO2 (%):  [40 %] 40 % Set Rate:  [17 bmp] 17 bmp   Intake/Output Summary (Last 24 hours) at 09/05/2023 0751 Last data filed at 09/05/2023 0600 Gross per 24 hour  Intake 1160.04 ml  Output 2690 ml  Net -1529.96 ml   Filed Weights   09/02/23 0500 09/04/23 0500 09/05/23 0500  Weight: 111.7 kg 107.1 kg 106 kg   Examination:  General:  Elderly appearing female in NAD Neuro:  Sleeping, easily arouses and answers orientation questions appropriately.  HEENT:  [place]/AT, PERRL, no JVD Cardiovascular:  IRIR, 3/6 murmur Lungs:  Clear bilateral breath sounds Abdomen:  Soft, non-distended, non-tender Musculoskeletal:  No acute deformity. No LE edema.  Skin:  Intact, MMM  Labs: K 3.1, Cr 1.59 (stable), Mg 2.1, BNP 1540 (improving). CVP 11, Coox 80%, 1.5 L negative x 24 hours. 5.2 L negative for the admission  Ancillary Tests Personally Reviewed:    Assessment & Plan:   Acute on chronic hypoxic and hypercapnic respiratory failure with associated metabolic encephalopathy, secondary to volume overload, superimposed on underlying COPD and OSA Plan BiPAP at bedtime and PRN Bronchodilator regimen - Brovana  and Yupelri  CTX off Mobilize/PT/OT  Acute on chronic HFpEF (grade III diastolic HF) with valvular heart disease,  with prior bioprosthetic aortic valve replacement and moderate MV regurg and stenosis, complicated further by pulmonary hypertension Plan Appreciate advanced heart failure consultation.  Weaning Norepi and vasopressin  for  MAP > 70 , SBP > 100 Midodrine  10mg  TID continue Trend CVP and co-ox Continue telemetry Hypotension preventing goal-directed therapy Trend BNP  Acute metabolic encephalopathy.  Initially felt to be hypercarbia,   Improving Plan Dc dex Cont BIPAP HS and PRN  OOB PT/OT Add back her neurontin  at lower dose  May need to consider some night time seroquel  AKI.  Serum creatinine improving. -3 liters yesterday and making sig improvement on total water  balance.  Plan Optimize cardiac output Renal dose meds Aggressively replace K IV lasix  diuresis once K is replaced.  Serial chems Strict I&O  History of PAF Plan Telemetry Eliquis   NASH cirrhosis.  Plan Continue nadolol  Intermittent LFTs > improved  Hyperglycemia Plan Ssi   Mild thrombocytopenia likely secondary to underlying cirrhosis currently stable Plan Trend CBC  H/o Sjogren's syndrome Plan Continuing hydroxychloroquine    Best Practice (right click and "Reselect all SmartList Selections" daily)   Diet/type: Regular consistency (see orders) DVT prophylaxis: SCDs Start: 08/25/23 1931 apixaban  (ELIQUIS ) tablet 5 mg   Pressure ulcers present: N/A GI prophylaxis: N/A Lines: Central line Foley:  Yes, and it is still needed Code Status:  full code Last date of multidisciplinary goals of care discussion [pending]    CRITICAL CARE   44 minutes     Roz Cornelia, AGACNP-BC [facility]  See Amion for personal pager PCCM on call pager [phone] until 7pm. Please call Elink 7p-7a. [phone]  09/05/2023 7:51 AM

## 2023-09-05 NOTE — Progress Notes (Addendum)
 Physical Therapy Treatment Patient Details Name: Whitney [name] MRN: [id number] DOB: 1955/11/07 Today's Date: 09/05/2023   History of Present Illness 68 yo female admitted on 08/25/23 with acute encephalopathy. Transferred to ICU 4/26 for worsening ABG/ increased lethargy.  PMH: NASH cirrhosis, DM2, PAF on eliquis , sjogren's syndrome, severe AS s/p AVR, OSA on CPAP.    PT Comments  Pt received in chair after working with OT, pt agreeable to therapy session with emphasis on transfer, seated exercise and gait initiation. Pt with limited standing tolerance due to fatigue after working with OT and episode of watery bowel incontinence, requiring multiple seated breaks and extensive hygiene assist. Pt with decreased attention to task and needing multimodal cues for some body mechanics cues, and does better with [employer] strategy due to decreased carryover of safety cues. ModA for short distance gait transferring back to bed from chair with +2 HHA needed when not using RW support. Patient will benefit from intensive inpatient follow-up therapy, >3 hours/day as she is beginning to tolerate more activity and participates well as able, with good family support available post-acute.  Vital Signs  Pulse Rate 89  Resp 19  BP -sitting in chair after initial stand  107/84   Vital Signs  Pulse Rate 81  Resp (!) 25  BP -after return to supine 102/81     If plan is discharge home, recommend the following: A little help with walking and/or transfers;A little help with bathing/dressing/bathroom;Assistance with cooking/housework;Direct supervision/assist for medications management;Assist for transportation;Supervision due to cognitive status;Help with stairs or ramp for entrance   Can travel by private vehicle        Equipment Recommendations  Rolling walker (2 wheels)    Recommendations for Other Services       Precautions / Restrictions Precautions Precautions: Fall Recall of  Precautions/Restrictions: Impaired Precaution/Restrictions Comments: monitor O2, BP, b/b incontinence Restrictions Weight Bearing Restrictions Per Provider Order: No     Mobility  Bed Mobility Overal bed mobility: Needs Assistance Bed Mobility: Rolling, Sit to Sidelying Rolling: Min assist, Used rails       Sit to sidelying: Min assist, Used rails General bed mobility comments: return to supine via log roll due to lines/pt fatigue. Rolling to L/R sides x2 ea for bed pad adjustment and hygiene assist.    Transfers Overall transfer level: Needs assistance Equipment used: Rolling walker (2 wheels), 2 person hand held assist Transfers: Sit to/from Stand, Bed to chair/wheelchair/BSC Sit to Stand: Min assist, Mod assist   Step pivot transfers: Mod assist       General transfer comment: modA STS from recliner>RW initially with dense cues for safe UE placement; episode of loose watery stools upon standing so NT and RN called to room to assist with clean-up, pt chair moved closer to bed and STS from chair>RW with minA, pt standing for peri-care, then +2 HHA for step pivot from chair>bed with modA    Ambulation/Gait Ambulation/Gait assistance: Mod assist, +2 safety/equipment Gait Distance (Feet): 4 Feet Assistive device: 2 person hand held assist Gait Pattern/deviations: Wide base of support, Shuffle, Step-to pattern, Decreased dorsiflexion - right, Decreased dorsiflexion - left       General Gait Details: pivotal steps toward her L side ~50ft, then lateral steps toward [employer] on her R wtih +2 HHA due to limited space in her room and many lines. Defer further gait distance due to long amount of time cleaning pt up after watery stools.   Stairs  Wheelchair Mobility     Tilt Bed    Modified Rankin (Stroke Patients Only)       Balance Overall balance assessment: Needs assistance Sitting-balance support: No upper extremity supported, Feet supported Sitting  balance-Leahy Scale: Fair Sitting balance - Comments: supervision at EOB for safety   Standing balance support: Bilateral upper extremity supported, During functional activity Standing balance-Leahy Scale: Poor Standing balance comment: reliant on external support +2                            Communication Communication Communication: Impaired Factors Affecting Communication: Difficulty expressing self (low volume)  Cognition Arousal: Alert Behavior During Therapy: Flat affect   PT - Cognitive impairments: Orientation, Memory, Attention, Initiation, Sequencing, Problem solving, Safety/Judgement   Orientation impairments: Time                   PT - Cognition Comments: Difficulty sequencing and problem solving, pt somewhat somnolent in chair but opens eyes to command and following simple motor commands but needs mod sequencing/directional cues and some mulitmodal cues for UE placement, etc prior to transfers with limited carryover. Episode of bowel incontinence with pt giving no warning after standing up. Following commands: Impaired Following commands impaired: Only follows one step commands consistently, Follows one step commands with increased time, Follows multi-step commands inconsistently    Cueing Cueing Techniques: Verbal cues, Gestural cues, Tactile cues  Exercises General Exercises - Lower Extremity Long Arc Quad: AROM, Both, 10 reps, Seated Other Exercises Other Exercises: Discussion on standing foot taps on step however prior to performing, pt had  episode of bowel incontinence Other Exercises: BUE AROM: shoulder flexion x5 reps ea, needs AA to raise more than a few inches    General Comments General comments (skin integrity, edema, etc.): BP 97/70 HR 97 bpm sitting in chair prior to standing, then BP 107/84 sitting in chair after standing and episode of bowel incontinence, HR 89 bpm; BP 102/81 RR 25 HR 81 bpm in supine after pivot back to bed.SpO2 100%  on 2L O2       Pertinent Vitals/Pain Pain Assessment Pain Assessment: No/denies pain Pain Intervention(s): Monitored during session, Repositioned, Limited activity within patient's tolerance    Home Living                          Prior Function            PT Goals (current goals can now be found in the care plan section) Acute Rehab PT Goals Patient Stated Goal: Per family to get stronger so she can go home with caregiver support. PT Goal Formulation: Patient unable to participate in goal setting Time For Goal Achievement: 09/17/23 Progress towards PT goals: Progressing toward goals    Frequency    Min 3X/week      PT Plan      Co-evaluation              AM-PAC PT "6 Clicks" Mobility   Outcome Measure  Help needed turning from your back to your side while in a flat bed without using bedrails?: A Little Help needed moving from lying on your back to sitting on the side of a flat bed without using bedrails?: A Little Help needed moving to and from a bed to a chair (including a wheelchair)?: A Lot Help needed standing up from a chair using your arms (e.g., wheelchair or bedside  chair)?: A Lot Help needed to walk in hospital room?: Total (<34ft today) Help needed climbing 3-5 steps with a railing? : Total 6 Click Score: 12    End of Session Equipment Utilized During Treatment: Gait belt;Oxygen  Activity Tolerance: Patient tolerated treatment well;Patient limited by fatigue;Other (comment) (bowel incontinence) Patient left: in bed;with call bell/phone within reach;with nursing/sitter in room (RN and NT present assisting pt to finish with peri-care) Nurse Communication: Mobility status;Other (comment) (loose stools) PT Visit Diagnosis: Other abnormalities of gait and mobility (R26.89)     Time: 1420-1501 PT Time Calculation (min) (ACUTE ONLY): 41 min  Charges:    $Therapeutic Activity: 38-52 mins PT General Charges $$ ACUTE PT VISIT: 1  Visit                     Whitney [name] P., PTA Acute Rehabilitation Services Secure Chat Preferred 9a-5:30pm Office: [phone]    Whitney [name] 09/05/2023, 3:33 PM

## 2023-09-05 NOTE — Progress Notes (Signed)
 Occupational Therapy Treatment Patient Details Name: Whitney [name] MRN: [id number] DOB: 31-Oct-1955 Today's Date: 09/05/2023   History of present illness 68 yo female admitted on 08/25/23 with acute encephalopathy. Transferred to ICU 4/26 for worsening ABG/ increased lethargy.  PMH: NASH cirrhosis, DM2, PAF on eliquis , sjogren's syndrome, severe AS s/p AVR, OSA on CPAP.   OT comments  Pt with cognitive improvements noted today but still not back to baseline. Son present during session, engaged with appropriate questions regarding pt care. Minor limitations today due to soft BP and active vomiting during session. Overall, pt requires Min A for bed mobility, up to Mod A for transfer to chair due to DME/cueing assist needed in additional to balance correction. Educated son and pt re: stair considerations in the home (PTA also planning to address today), gait belt use, DME needed and ADL considerations. Both pt and son hopeful for intensive rehab services at DC; confirmed ability to provide 24/7 assist.  BP pre activity: 113/86 BP after sitting in chair > 5 min: 97/76       If plan is discharge home, recommend the following:  Supervision due to cognitive status;Direct supervision/assist for financial management;Direct supervision/assist for medications management;Assist for transportation;A lot of help with bathing/dressing/bathroom;A little help with walking and/or transfers   Equipment Recommendations  Other (comment);BSC/3in1 (potentially RW. Son planning to check if they have this DME at home)    Recommendations for Other Services Rehab consult    Precautions / Restrictions Precautions Precautions: Fall Precaution/Restrictions Comments: monitor O2, BP Restrictions Weight Bearing Restrictions Per Provider Order: No       Mobility Bed Mobility Overal bed mobility: Needs Assistance Bed Mobility: Supine to Sit     Supine to sit: Min assist, HOB elevated, Used rails     General bed  mobility comments: light assist to scoot hips to EOB. cues for use of bedrail and sequencing    Transfers Overall transfer level: Needs assistance Equipment used: Rolling walker (2 wheels) Transfers: Sit to/from Stand, Bed to chair/wheelchair/BSC Sit to Stand: Min assist     Step pivot transfers: Mod assist     General transfer comment: Min A to stand at bedside, Mod A for pivot to chair with step by step cues for moving feet, assist for RW and assist to correct mild posterior bias     Balance Overall balance assessment: Needs assistance Sitting-balance support: No upper extremity supported, Feet supported Sitting balance-Leahy Scale: Fair     Standing balance support: Bilateral upper extremity supported, During functional activity Standing balance-Leahy Scale: Poor                             ADL either performed or assessed with clinical judgement   ADL Overall ADL's : Needs assistance/impaired                 Upper Body Dressing : Supervision/safety;Sitting   Lower Body Dressing: Maximal assistance Lower Body Dressing Details (indicate cue type and reason): sock assist               General ADL Comments: Son present, educated on use of gait belt, potential stair mgmt in the home (installing a rail, portable ramp from online vs rearranging pt living quarters), ADL safety and fall prevention    Extremity/Trunk Assessment Upper Extremity Assessment Upper Extremity Assessment: Right hand dominant;Generalized weakness   Lower Extremity Assessment Lower Extremity Assessment: Defer to PT evaluation  Vision   Vision Assessment?: No apparent visual deficits   Perception     Praxis     Communication Communication Communication: No apparent difficulties   Cognition Arousal: Alert Behavior During Therapy: WFL for tasks assessed/performed Cognition: Cognition impaired     Awareness: Intellectual awareness intact, Online awareness  impaired Memory impairment (select all impairments): Declarative long-term memory, Working memory, Short-term memory Attention impairment (select first level of impairment): Selective attention Executive functioning impairment (select all impairments): Problem solving, Sequencing OT - Cognition Comments: pleasant, appropriate in conversation and discussing DME needs at home. cues needed for sequencing and problem solving. pt able to recall phone password correctly today per son                 Following commands: Impaired Following commands impaired: Only follows one step commands consistently, Follows one step commands with increased time, Follows multi-step commands inconsistently      Cueing   Cueing Techniques: Verbal cues, Gestural cues, Tactile cues  Exercises      Shoulder Instructions       General Comments Located smaller recliner chair more appropriate for pt    Pertinent Vitals/ Pain       Pain Assessment Pain Assessment: No/denies pain  Home Living                                          Prior Functioning/Environment              Frequency  Min 2X/week        Progress Toward Goals  OT Goals(current goals can now be found in the care plan section)  Progress towards OT goals: Progressing toward goals  Acute Rehab OT Goals Patient Stated Goal: son hopeful for pt to be able to mobilize more OT Goal Formulation: With patient Time For Goal Achievement: 09/10/23 Potential to Achieve Goals: Good ADL Goals Pt Will Perform Grooming: with supervision;with set-up;standing Pt Will Perform Upper Body Bathing: with set-up;with modified independence Pt Will Perform Lower Body Bathing: with contact guard assist;with supervision Pt Will Perform Upper Body Dressing: with set-up;with modified independence Pt Will Perform Lower Body Dressing: with contact guard assist;with supervision Pt Will Transfer to Toilet: with contact guard assist;with  supervision;ambulating Pt Will Perform Toileting - Clothing Manipulation and hygiene: with supervision;with modified independence Pt Will Perform Tub/Shower Transfer: with contact guard assist;with supervision;ambulating;rolling walker;shower seat;grab bars  Plan      Co-evaluation                 AM-PAC OT "6 Clicks" Daily Activity     Outcome Measure   Help from another person eating meals?: A Little Help from another person taking care of personal grooming?: A Little Help from another person toileting, which includes using toliet, bedpan, or urinal?: A Little Help from another person bathing (including washing, rinsing, drying)?: A Lot Help from another person to put on and taking off regular upper body clothing?: A Little Help from another person to put on and taking off regular lower body clothing?: A Lot 6 Click Score: 16    End of Session Equipment Utilized During Treatment: Gait belt;Rolling walker (2 wheels)  OT Visit Diagnosis: Unsteadiness on feet (R26.81);Other abnormalities of gait and mobility (R26.89);Other symptoms and signs involving cognitive function   Activity Tolerance Patient tolerated treatment well   Patient Left in chair;with call bell/phone within reach;with family/visitor  present;Other (comment) (PTA present)   Nurse Communication Mobility status        Time: 1330-1420 OT Time Calculation (min): 50 min  Charges: OT General Charges $OT Visit: 1 Visit OT Treatments $Self Care/Home Management : 8-22 mins $Therapeutic Activity: 23-37 mins  Lawrence Pretty, OTR/L Acute Rehab Services Office: [phone]   Annabella Barr 09/05/2023, 2:41 PM

## 2023-09-05 NOTE — Progress Notes (Signed)
 [facility] ADULT ICU REPLACEMENT PROTOCOL   The patient does apply for the [facility] Adult ICU Electrolyte Replacment Protocol based on the criteria listed below:   1.Exclusion criteria: TCTS, ECMO, Dialysis, and Myasthenia Gravis patients 2. Is GFR >/= 30 ml/min? Yes.    Patient's GFR today is 35 3. Is SCr </= 2? Yes.   Patient's SCr is 1.59 mg/dL 4. Did SCr increase >/= 0.5 in 24 hours? No. 5.Pt's weight >40kg  Yes.   6. Abnormal electrolyte(s): K+ = 3.5  7. Electrolytes replaced per protocol 8.  Call MD STAT for K+ </= 2.5, Phos </= 1, or Mag </= 1 Physician:  Evelyn Hire, eMD   Jeffry Minister 09/05/2023 5:23 AM

## 2023-09-06 DIAGNOSIS — J9621 Acute and chronic respiratory failure with hypoxia: Secondary | ICD-10-CM | POA: Diagnosis not present

## 2023-09-06 DIAGNOSIS — G9341 Metabolic encephalopathy: Secondary | ICD-10-CM | POA: Diagnosis not present

## 2023-09-06 DIAGNOSIS — I5033 Acute on chronic diastolic (congestive) heart failure: Secondary | ICD-10-CM | POA: Diagnosis not present

## 2023-09-06 DIAGNOSIS — J9622 Acute and chronic respiratory failure with hypercapnia: Secondary | ICD-10-CM | POA: Diagnosis not present

## 2023-09-06 DIAGNOSIS — K7469 Other cirrhosis of liver: Secondary | ICD-10-CM

## 2023-09-06 LAB — BASIC METABOLIC PANEL WITH GFR
Anion gap: 9 (ref 5–15)
BUN: 49 mg/dL — ABNORMAL HIGH (ref 8–23)
CO2: 34 mmol/L — ABNORMAL HIGH (ref 22–32)
Calcium: 9.1 mg/dL (ref 8.9–10.3)
Chloride: 95 mmol/L — ABNORMAL LOW (ref 98–111)
Creatinine, Ser: 1.61 mg/dL — ABNORMAL HIGH (ref 0.44–1.00)
GFR, Estimated: 34 mL/min — ABNORMAL LOW (ref >60)
Glucose, Bld: 114 mg/dL — ABNORMAL HIGH (ref 70–99)
Potassium: 4.2 mmol/L (ref 3.5–5.1)
Sodium: 138 mmol/L (ref 135–145)

## 2023-09-06 LAB — GLUCOSE, CAPILLARY
Glucose-Capillary: 118 mg/dL — ABNORMAL HIGH (ref 70–99)
Glucose-Capillary: 134 mg/dL — ABNORMAL HIGH (ref 70–99)
Glucose-Capillary: 144 mg/dL — ABNORMAL HIGH (ref 70–99)
Glucose-Capillary: 106 mg/dL — ABNORMAL HIGH (ref 70–99)

## 2023-09-06 LAB — CBC
HCT: 40.3 % (ref 36.0–46.0)
Hemoglobin: 12.4 g/dL (ref 12.0–15.0)
MCH: 30.2 pg (ref 26.0–34.0)
MCHC: 30.8 g/dL (ref 30.0–36.0)
MCV: 98.3 fL (ref 80.0–100.0)
Platelets: 48 10*3/uL — ABNORMAL LOW (ref 150–400)
RBC: 4.1 MIL/uL (ref 3.87–5.11)
RDW: 16.6 % — ABNORMAL HIGH (ref 11.5–15.5)
WBC: 5.9 10*3/uL (ref 4.0–10.5)
nRBC: 0 % (ref 0.0–0.2)

## 2023-09-06 LAB — MAGNESIUM: Magnesium: 2.1 mg/dL (ref 1.7–2.4)

## 2023-09-06 LAB — PHOSPHORUS: Phosphorus: 3.7 mg/dL (ref 2.5–4.6)

## 2023-09-06 MED ORDER — PANTOPRAZOLE SODIUM 40 MG PO TBEC
40.0000 mg | DELAYED_RELEASE_TABLET | Freq: Every day | ORAL | Status: DC
  Administered 2023-09-06 – 2023-09-14 (×9): 40 mg via ORAL
  Filled 2023-09-06 (×9): qty 1

## 2023-09-06 NOTE — Progress Notes (Signed)
 Speech Language Pathology Treatment: Cognitive-Linquistic  Patient Details Name: Whitney [name] MRN: [id number] DOB: 27-Jan-1956 Today's Date: 09/06/2023 Time: 8469-6295 SLP Time Calculation (min) (ACUTE ONLY): 17 min  Assessment / Plan / Recommendation Clinical Impression  Pt presents with significant impairments related to sustained attention. Given Max cueing, she was unable to follow commands to identify errors in a pillbox activity and had difficulty referencing a key to determine target. Pt then was successful with Mod multimodal cueing to identify a target out of multiple choices, crossing them off accordingly. Of note, the placement of her "X" when crossing off the pictures was often in the vicinity of the target but not consistently on the picture itself. Provided education to pt and her family. Will continue to f/u.    HPI HPI: Whitney [name] is a 68 yo female presenting to ED 4/20 with AMS. MRI Brain shows small acute posterior R frontal white matter infarct. Seen by SLP 04/14/22 with WFL oropharyngeal swallow and cognition. PMH includes Sjogren's syndrome, T2DM, fibromyalgia, depression, GERD, history of aortic valve replacement, A-fib on Eliquis , TIA, NASH cirrhosis, pulmonary HTN, 4L [place] at baseline      SLP Plan  Continue with current plan of care      Recommendations for follow up therapy are one component of a multi-disciplinary discharge planning process, led by the attending physician.  Recommendations may be updated based on patient status, additional functional criteria and insurance authorization.    Recommendations                     Oral care BID   Frequent or constant Supervision/Assistance Frontal lobe and executive function deficit;Cognitive communication deficit (M84.132)   Cerebral infarction Continue with current plan of care     Amil Kale, M.A., CCC-SLP Speech Language Pathology, Acute Rehabilitation Services  Secure Chat  preferred [phone]   09/06/2023, 10:09 AM

## 2023-09-06 NOTE — Progress Notes (Signed)
 NAME:  RAYVON ESPELAND, MRN:  [id number], DOB:  [birth year], LOS: 12 ADMISSION DATE:  08/25/2023, CONSULTATION DATE:  08/31/2023 REFERRING MD:  Sandria Cruise - TRH, CHIEF COMPLAINT: Hypercapnic respiratory failure  History of Present Illness:  68 year old woman with a past medical history significant for NASH cirrhosis, HTN, HLD, pulmonary hypertension, Sjogren's syndrome, thrombocytopenia in the setting of cirrhosis, type 2 diabetes and anemia who presented to the ED at [facility] 4/20 for complaints of altered mental status. Workup thus far has indicated that AMS is related to hypercapnic respiratory failure for whish PCCM has been called to assist in management .   Pertinent Medical History:  NASH cirrhosis, HTN, HLD, pulmonary hypertension, Sjogren's syndrome, thrombocytopenia in the setting of cirrhosis, type 2 diabetes and anemia  Significant Hospital Events: Including procedures, antibiotic start and stop dates in addition to other pertinent events   4/20 Admitted with altered mental status being this blood gas with PCO2 69.4 4/26 PCCM consulted ABG with pH 7.29 and PCO261, moved to the intensive care.  Placed on NIPPV had not received diuretics since admission 4/27 Did have a hypoglycemic event requiring dextrose  replacement BNP was 1322 echo cardiogram review shows hyperdynamic LV with grade 3 diastolic dysfunction and elevated LAP bioprosthesis aortic valve in place there was severe TR, severe mitral valve disease and severe PAH with a dilated IVC.,  Limited follow-up echocardiogram continues to show hyperdynamic EF greater than 75% RV moderately enlarged with moderately elevated pulmonary artery systolic pressures, left atrial size dilated estimated right atrial pressure 15 mmHg, 4/28 PICC placed, norepi and later vasopressin  started. Ad HF added empiric ceftriaxone  4/29 still w/ some hypercarbia, stopped dex. Cont lasix  gtt and vasopressor support. Volume status much improved. CXR improved.  Delirium an issue. PT ordered. OOB ordered. Resumed Neurontin . 5/1 Remains on NE, Vaso.  Interim History / Subjective:  No significant events overnight Tolerated BiPAP overnight, no issues Now on 2L [place], eating breakfast Remains on NE + vaso 0.03 with SBPs 90s-100s Son at bedside  Objective:   Blood pressure 101/73, pulse 76, temperature 97.7 F (36.5 C), temperature source Axillary, resp. rate 12, height 5' 3.75" (1.619 m), weight 106 kg, SpO2 98%. CVP:  [5 mmHg-28 mmHg] 14 mmHg  Vent Mode: BIPAP FiO2 (%):  [40 %] 40 % Set Rate:  [20 bmp] 20 bmp PEEP:  [5 cmH20] 5 cmH20 Pressure Support:  [15 cmH20-20 cmH20] 15 cmH20   Intake/Output Summary (Last 24 hours) at 09/06/2023 0805 Last data filed at 09/06/2023 0700 Gross per 24 hour  Intake 913.24 ml  Output 1570 ml  Net -656.76 ml   Filed Weights   09/02/23 0500 09/04/23 0500 09/05/23 0500  Weight: 111.7 kg 107.1 kg 106 kg   Physical Examination: General: Acute-on-chronically ill-appearing older woman in NAD. Pleasant and conversant. HEENT: [place]/AT, anicteric sclera, PERRL, moist mucous membranes. Neuro: Awake, oriented x 3-4 (occasionally confused re: situation). Responds to verbal stimuli. Following commands consistently. Moves all 4 extremities spontaneously. Generalized weakness.  CV: RRR, no m/g/r. PULM: Breathing even and unlabored on 2LNC. Lung fields CTAB anteriorly, mildly decreased at bases. R > L GI: Soft, nontender, mildly distended. Normoactive bowel sounds. Extremities: Trace symmetric BLE edema noted. Skin: Warm/dry, no rashes.  Ancillary Tests Personally Reviewed:     Assessment & Plan:   Acute-on-chronic hypoxic and hypercapnic respiratory failure with associated metabolic encephalopathy, secondary to volume overload, superimposed on underlying COPD and OSA. - BiPAP QHS + PRN - Supplemental O2 for sat > 90% -  Bronchodilators (Brovana /Yupelri , albuterol  PRN) - Pulmonary hygiene (IS, encourage  OOB/mobility) - Diuresis as tolerated (Lasix /Diamox ), monitor I&Os - Follow CXR  Acute-on-chronic HFpEF (grade III diastolic HF), high-output Pulmonary HTN S/p AVR (bioprosthetic) Patient also with valvular heart disease, with prior bioprosthetic aortic valve replacement and moderate MV regurg and stenosis. Echo with EF > 75%, hyperdynamic LV, moderately enlarged RV/PASP elevated, moderately dilated LA, s/p AVR (bioprosthetic). RHC 04/2023 with elevated R-sided filling pressure, normal PCWP, moderate PAH with likely high output pHTN, high cardiac output. - AHF following, appreciate recommendations - Goal MAP > 60 - NE titrated to goal MAP + vaso; trial off of NE today 5/2 - Continue increased dose midodrine  15mg  TID - Trend CVP, Co-ox - Cardiac monitoring - Possible TEE at some point, however risk may outweigh benefit with known EV  Acute metabolic encephalopathy, r/t hypercarbia, Improving - Precedex  discontinued, avoiding sedating medications as able - Will continue low-dose gabapentin  - Consider low-dose Seroquel at bedtime to promote sleep/wake - BiPAP as above - Correct metabolic derangements as able - Limit interruptions to normal sleep/wake cycle - Encourage OOB, mobilization  AKI, likely in the setting of HF Serum creatinine improving slowly. - Trend BMP - Replete electrolytes as indicated - Monitor I&Os, diuresis as tolerated (hold today 5/2) - Avoid nephrotoxic agents as able - Ensure adequate renal perfusion/optimize cardiac output  History of PAF - Cardiac monitoring - Optimize electrolytes for K > 4, Mg > 2 - Eliquis  for AC  NASH cirrhosis Follows at [facility]. Last EGD 2021 with grade 2 EV. - Trend LFTs - Nadolol  held - Diuresis as tolerated  Hyperglycemia - SSI - CBGs ACHS - Goal CBG 140-180  Mild thrombocytopenia likely secondary to underlying cirrhosis currently stable - Trend CBC - Monitor for signs of active bleeding - Transfuse for Plt < 20K  H/o  Sjogren's syndrome - Continue Plaquenil   GOC - FULL CODE at present - Given progressive and compounding diagnoses of high-output HF, pHTN, NASH cirrhosis, discussion with patient/family re: long term outcomes warranted - Consider PMT consult if family open to this  Best Practice (right click and "Reselect all SmartList Selections" daily)   Diet/type: Regular consistency (see orders) DVT prophylaxis: SCDs Start: 08/25/23 1931 apixaban  (ELIQUIS ) tablet 5 mg   Pressure ulcers present: N/A GI prophylaxis: N/A Lines: Central line Foley:  Yes, and it is still needed Code Status:  full code Last date of multidisciplinary goals of care discussion [5/2 - bedside discussion with PCCM/AHF]  Critical care time:   The patient is critically ill with multiple organ system failure and requires high complexity decision making for assessment and support, frequent evaluation and titration of therapies, advanced monitoring, review of radiographic studies and interpretation of complex data.   Critical Care Time devoted to patient care services, exclusive of separately billable procedures, described in this note is 38 minutes.  Star [place], PA-C [facility] 09/06/23 8:06 AM  Please see [email or web address] for pager details.  From 7A-7P if no response, please call [phone] After hours, please call ELink [phone]

## 2023-09-06 NOTE — Progress Notes (Signed)
 Advanced Heart Failure Rounding Note  Cardiologist: Janelle Mediate, MD   Chief Complaint: HFpEF, Pulmonary Hypertension  Subjective:    Continues on pressor support with 2 NE + 0.03 Vaso despite 15 midodrine  TID.  Had a rough day yesterday, multiple loose bowel movements and episode of emesis. Feeling better this morning.    Objective:    Weight Range: 106 kg Body mass index is 40.43 kg/m.   Vital Signs:   Temp:  [96.8 F (36 C)-97.7 F (36.5 C)] 97.7 F (36.5 C) (05/02 0724) Pulse Rate:  [71-102] 76 (05/02 0700) Resp:  [12-25] 12 (05/02 0700) BP: (81-127)/(49-99) 101/73 (05/02 0700) SpO2:  [83 %-100 %] 98 % (05/02 0700) FiO2 (%):  [40 %] 40 % (05/02 0803) Last BM Date : 09/05/23  Weight change: Filed Weights   09/02/23 0500 09/04/23 0500 09/05/23 0500  Weight: 111.7 kg 107.1 kg 106 kg   Intake/Output:  Intake/Output Summary (Last 24 hours) at 09/06/2023 1057 Last data filed at 09/06/2023 0700 Gross per 24 hour  Intake 808.43 ml  Output 1570 ml  Net -761.57 ml    Physical Exam    General:  Chronically ill appearing Cor: Irregularly irregular rhythm. No rubs, gallops or murmurs. Lungs: breathing nonlabored Abdomen: obese, soft, nontender, nondistended.  Extremities: trace edema Neuro: Awake/alert. Conversing with son at bedside.    Telemetry   Afib 80s-90s  Labs    CBC Recent Labs    09/05/23 0419 09/06/23 0500  WBC 5.3 5.9  HGB 12.7 12.4  HCT 38.8 40.3  MCV 93.0 98.3  PLT 53* 48*   Basic Metabolic Panel Recent Labs    86/57/84 0419 09/06/23 0500  NA 137 138  K 3.1* 4.2  CL 94* 95*  CO2 31 34*  GLUCOSE 125* 114*  BUN 51* 49*  CREATININE 1.59* 1.61*  CALCIUM  9.0 9.1  MG 2.1 2.1  PHOS 3.9 3.7   BNP (last 3 results) Recent Labs    09/03/23 0320 09/04/23 0322 09/05/23 0419  BNP 2,106.9* 6,962.9* 1,540.4*   ProBNP (last 3 results) Recent Labs    03/13/23 1428  PROBNP 996.0*   Medications:    Scheduled Medications:   apixaban   5 mg Oral BID   arformoterol   15 mcg Nebulization BID   calcium  carbonate  200 mg of elemental calcium  Oral TID   Chlorhexidine  Gluconate Cloth  6 each Topical Q0600   ezetimibe   10 mg Oral QHS   feeding supplement  237 mL Oral BID BM   gabapentin   100 mg Oral TID   hydroxychloroquine   200 mg Oral Daily   insulin  aspart  0-9 Units Subcutaneous TID WC   lidocaine   1 patch Transdermal Q24H   midodrine   15 mg Oral TID WC   multivitamin with minerals  1 tablet Oral BID   pantoprazole   40 mg Oral Daily   polyethylene glycol  17 g Oral Daily   revefenacin   175 mcg Nebulization Daily   rosuvastatin   10 mg Oral Daily   senna  2 tablet Oral Daily   sodium chloride  flush  10-40 mL Intracatheter Q12H   Infusions:  vasopressin  0.03 Units/min (09/06/23 0700)    PRN Medications: acetaminophen  **OR** acetaminophen , albuterol , alum & mag hydroxide-simeth, antiseptic oral rinse, bisacodyl , melatonin, naLOXone  (NARCAN )  injection, ondansetron  (ZOFRAN ) IV, mouth rinse, sodium chloride  flush  Patient Profile   Whitney [name] is a 68-year-old female with a complex past medical history of Sjogren syndrome, cirrhosis, rheumatic heart disease with bioprosthetic  AVR, mitral valve disease, pulmonary hypertension.    Assessment/Plan  HFpEF/ high output HF Pulmonary hypertension - RHC in 2024 most consistent with precapillary PH, started on tadalafil . Done after diuresis, and suspect this underestimates her group II component, especially given mitral valve disease - Does have a large component of portopulmonary HTN, especially given high cardiac output - Echo this admission: EF >75%, RV moderately enlarged, RVSP 51 mmHg, severe LAE - On 2 NE + 0.03 Vaso. Also on midodrine  15 TID. Liver disease is main driver of hypotension.  - Wean NE off today and plan to try to get Vaso off next (likely tomorrow). Lower MAP goal to 65. If she developed progressive renal failure with attempts to wean pressors she  would not be a candidate for HD. - Volume okay to today. Hold diuretics.  - Stop spironolactone . - GDMT limited by acute exacerbation and hypotension - Poor SGLT2i candidate - Continue to hold tadalafil  for now   Acute on chronic hypoxic and hypercapnic respiratory failure - In setting of COPD, OSA and volume overload - BiPAP PRN and at HS - improved. Now on [place]. Alert.  AKI - Suspect mostly hepatorenal +/- component of cardiorenal - Improved with diuresis. Will need to follow closely as pressors are weaned.  Valvular disease  - S/p bioprosthetic AVR, severe MAC - Gradients mildly high for AVR, but otherwise functioning well   Cirrhosis - due to NAFLD, w/ portal HTN - Follows at [facility] - Nadolol  on hold d/t pressor requirement    PAF:  - Continue eliquis . Monitor thrombocytopenia. - in AF with controlled rate  - Depending on progress can consider TEE/DCCV prior to discharge but this would carry increased risk w/ hx of varices  Thrombocytopenia -In setting of cirrhosis -Platelets have drifted from 80s>>48K this admit, historically 50s-60s -Watch closely with anticoagulation    Length of Stay: 12  CRITICAL CARE Performed by: Abron Abt   Total critical care time: 18 minutes  Critical care time was exclusive of separately billable procedures and treating other patients.  Critical care was necessary to treat or prevent imminent or life-threatening deterioration.  Critical care was time spent personally by me on the following activities: development of treatment plan with patient and/or surrogate as well as nursing, discussions with consultants, evaluation of patient's response to treatment, examination of patient, obtaining history from patient or surrogate, ordering and performing treatments and interventions, ordering and review of laboratory studies, ordering and review of radiographic studies, pulse oximetry and re-evaluation of patient's condition.   Nasia Cannan,  Ziare Cryder N, PA-C  09/06/2023, 10:57 AM  Advanced Heart Failure Team Pager [phone] (M-F; 7a - 5p)  Please contact [facility] for night-coverage after hours (5p -7a ) and weekends on [email or web address]

## 2023-09-06 NOTE — Progress Notes (Signed)
 Inpatient Rehab Coordinator Note:  I met with patient at bedside to discuss CIR recommendations and goals/expectations of CIR stay.  We reviewed 3 hrs/day of therapy, physician follow up, and average length of stay 2 weeks (dependent upon progress) with goals of supervision to mod I.  She is home with her son who is supportive.  We discussed barriers to CIR at this point to include further weaning off pressors and hemodynamic/respiratory stability.  I will f/u with her on Monday and see how she's doing. We would need to pursue auth with [facility] when she's closer to being ready to transition to CIR.   Loye Rumble, PT, DPT Admissions Coordinator [phone] 09/06/23  12:02 PM

## 2023-09-06 NOTE — TOC Initial Note (Signed)
 Transition of Care [facility]) - Initial/Assessment Note    Patient Details  Name: Whitney [name] MRN: [id number] Date of Birth: [birth year]  Transition of Care [facility]) CM/SW Contact:    Benjiman Bras, RN Phone Number: [phone] 09/06/2023, 4:45 PM  Clinical Narrative:                  TOC CM spoke to pt and daughter at bedside. Gave permission to speak to dtr, Nicki. Pt lives with her son, Dorothea Gata. She was independent pta. Her CPAP is currently not working. Son just received parts but device is older. Contacted Mitch, [employer] rep for BIPAP for home. He will get orders set for MD to review and sign through insurance. Dtr requesting RW. She has Rollator. Son is at home to assist with her care. Plan is for IP rehab. CIR following and will start process closer to being medically ready. She has oxygen  with [employer]. Has HH with [facility].     Expected Discharge Plan: IP Rehab Facility Barriers to Discharge: Continued Medical Work up   Patient Goals and CMS Choice Patient states their goals for this hospitalization and ongoing recovery are:: wants to get better CMS Medicare.gov Compare Post Acute Care list provided to:: Patient Represenative (must comment) Choice offered to / list presented to : Adult Children      Expected Discharge Plan and Services   Discharge Planning Services: CM Consult Post Acute Care Choice: Home Health Living arrangements for the past 2 months: Single Family Home                 DME Arranged: Walker rolling, NIV DME Agency: [employer] Date DME Agency Contacted: 09/06/23 Time DME Agency Contacted: [id number] Representative spoke with at DME Agency: Mitch HH Arranged: PT, OT, Nurse's Aide, RN HH Agency: [facility] Date [facility] Agency Contacted: 08/28/23   Representative spoke with at [facility] Agency: Randel Buss  Prior Living Arrangements/Services Living arrangements for the past 2 months: Single Family Home Lives with:: Adult Children Patient language and need  for interpreter reviewed:: Yes Do you feel safe going back to the place where you live?: Yes        Care giver support system in place?: No (comment) Current home services: DME (cane/ walker/ shower seat/oxygen ) Criminal Activity/Legal Involvement Pertinent to Current Situation/Hospitalization: No - Comment as needed  Activities of Daily Living   ADL Screening (condition at time of admission) Independently performs ADLs?: No Does the patient have a NEW difficulty with bathing/dressing/toileting/self-feeding that is expected to last >3 days?: Yes (Initiates electronic notice to provider for possible OT consult) Does the patient have a NEW difficulty with getting in/out of bed, walking, or climbing stairs that is expected to last >3 days?: Yes (Initiates electronic notice to provider for possible PT consult) Does the patient have a NEW difficulty with communication that is expected to last >3 days?: Yes (Initiates electronic notice to provider for possible SLP consult) Is the patient deaf or have difficulty hearing?: No Does the patient have difficulty seeing, even when wearing glasses/contacts?: Yes Does the patient have difficulty concentrating, remembering, or making decisions?: Yes  Permission Sought/Granted Permission sought to share information with : Case Manager, Family Supports, PCP Permission granted to share information with : Yes, Verbal Permission Granted  Share Information with NAME: Nicki or Dorothea Gata  Permission granted to share info w AGENCY: Home Health, DME  Permission granted to share info w Relationship: daughter, son     Emotional Assessment  Appearance:: Appears stated age Attitude/Demeanor/Rapport: Engaged Affect (typically observed): Accepting Orientation: : Fluctuating Orientation (Suspected and/or reported Sundowners)   Psych Involvement: No (comment)  Admission diagnosis:  Acute encephalopathy [G93.40] Altered mental status, unspecified altered mental status  type [R41.82] Patient Active Problem List   Diagnosis Date Noted   Altered mental status 08/26/2023   Acute encephalopathy 08/25/2023   Generalized weakness 08/25/2023   Dog bite 08/25/2023   Moderate persistent asthma 08/25/2023   Acute diastolic heart failure (HCC) 04/07/2023   Peripheral edema 04/07/2023   Acute hypoxemic respiratory failure (HCC) 03/15/2023   Chronic hypoxic respiratory failure (HCC) 03/14/2023   DM2 (diabetes mellitus, type 2) (HCC) 03/14/2023   Elevated troponin 03/14/2023   COPD with acute exacerbation (HCC) 03/12/2023   Acute respiratory failure (HCC) 03/12/2023   Elevated diaphragm 03/12/2023   History of TIA (transient ischemic attack) 03/12/2023   Hypercoagulable state due to paroxysmal atrial fibrillation (HCC) 05/08/2022   Cerebrovascular accident (CVA) (HCC) 04/13/2022   COPD (chronic obstructive pulmonary disease) (HCC) 01/18/2021   Pulmonary hypertension, unspecified (HCC) 06/15/2019   Dyspnea 01/06/2019   Paroxysmal atrial fibrillation (HCC) 01/24/2018   S/P AVR 12/05/2017   Thrombocytopenia (HCC) 11/13/2017   Other neutropenia (HCC) 11/13/2017   Numbness in feet 10/14/2017   Severe aortic stenosis    Inguinal hernia 10/22/2016   Obesity (BMI 30.0-34.9) 04/26/2016   Lower extremity edema 06/22/2015   Status post bariatric surgery 03/29/2014   Aortic valve disorder 04/21/2013   Anasarca 04/21/2013   Depression    OSA (obstructive sleep apnea)    Sjogren's syndrome (HCC)    Liver cirrhosis secondary to NASH (HCC)    HLD (hyperlipidemia)    Fibromyalgia    Postmenopausal bleeding 12/11/2012   Endometrial polyp 12/11/2012   NASH (nonalcoholic steatohepatitis) 08/06/2012   Chronic cough 04/11/2011   PCP:  Jimmey Mould, MD Pharmacy:   [facility] [phone] - [place],  - [address] [address] [place] [address] Phone: [phone] Fax: [phone]  [place] [facility] [address] [place] [address] Phone: [phone] Fax: [phone]  [facility] [place] [facility] [address] [place] [address] Phone: [phone] Fax: [phone]     Social Drivers of Health (SDOH) Social History: SDOH Screenings   Food Insecurity: No Food Insecurity (08/27/2023)  Housing: Low Risk  (09/03/2023)  Transportation Needs: No Transportation Needs (08/27/2023)  Utilities: Not At Risk (08/27/2023)  Depression (PHQ2-9): Low Risk  (09/20/2020)  Recent Concern: Depression (PHQ2-9) - Medium Risk (07/11/2020)  Social Connections: Unknown (08/27/2023)  Tobacco Use: Low Risk  (08/25/2023)   SDOH Interventions:     Readmission Risk Interventions     No data to display

## 2023-09-06 NOTE — TOC Progression Note (Signed)
 Transition of Care [facility] - Progression Note    Patient Details  Name: Whitney [name] MRN: [id number] Date of Birth: 1955-12-04  Transition of Care [facility]) CM/SW Contact  Ernst Heap Phone Number: [phone] 09/06/2023, 9:43 AM  Clinical Narrative:   Will continue to follow patient to monitor dc needs.    TOC will continue to follow.     Expected Discharge Plan: Home w Home Health Services    Expected Discharge Plan and Services   Discharge Planning Services: CM Consult Post Acute Care Choice: Home Health Living arrangements for the past 2 months: Single Family Home                           HH Arranged: OT, PT, Social Work, Nurse's Aide HH Agency: [facility] Date [facility] Agency Contacted: 08/28/23   Representative spoke with at [facility] Agency: Randel Buss   Social Determinants of Health (SDOH) Interventions SDOH Screenings   Food Insecurity: No Food Insecurity (08/27/2023)  Housing: Low Risk  (09/03/2023)  Transportation Needs: No Transportation Needs (08/27/2023)  Utilities: Not At Risk (08/27/2023)  Depression (PHQ2-9): Low Risk  (09/20/2020)  Recent Concern: Depression (PHQ2-9) - Medium Risk (07/11/2020)  Social Connections: Unknown (08/27/2023)  Tobacco Use: Low Risk  (08/25/2023)    Readmission Risk Interventions     No data to display

## 2023-09-07 ENCOUNTER — Inpatient Hospital Stay (HOSPITAL_COMMUNITY)

## 2023-09-07 DIAGNOSIS — J9621 Acute and chronic respiratory failure with hypoxia: Secondary | ICD-10-CM | POA: Diagnosis not present

## 2023-09-07 DIAGNOSIS — K7581 Nonalcoholic steatohepatitis (NASH): Secondary | ICD-10-CM | POA: Diagnosis not present

## 2023-09-07 DIAGNOSIS — G9341 Metabolic encephalopathy: Secondary | ICD-10-CM | POA: Diagnosis not present

## 2023-09-07 DIAGNOSIS — J9622 Acute and chronic respiratory failure with hypercapnia: Secondary | ICD-10-CM | POA: Diagnosis not present

## 2023-09-07 LAB — GLUCOSE, CAPILLARY
Glucose-Capillary: 94 mg/dL (ref 70–99)
Glucose-Capillary: 132 mg/dL — ABNORMAL HIGH (ref 70–99)
Glucose-Capillary: 117 mg/dL — ABNORMAL HIGH (ref 70–99)
Glucose-Capillary: 95 mg/dL (ref 70–99)

## 2023-09-07 LAB — BASIC METABOLIC PANEL WITH GFR
Anion gap: 9 (ref 5–15)
Anion gap: 10 (ref 5–15)
Anion gap: 10 (ref 5–15)
BUN: 51 mg/dL — ABNORMAL HIGH (ref 8–23)
BUN: 54 mg/dL — ABNORMAL HIGH (ref 8–23)
BUN: 55 mg/dL — ABNORMAL HIGH (ref 8–23)
CO2: 33 mmol/L — ABNORMAL HIGH (ref 22–32)
CO2: 31 mmol/L (ref 22–32)
CO2: 32 mmol/L (ref 22–32)
Calcium: 9 mg/dL (ref 8.9–10.3)
Calcium: 9.1 mg/dL (ref 8.9–10.3)
Calcium: 9.1 mg/dL (ref 8.9–10.3)
Chloride: 96 mmol/L — ABNORMAL LOW (ref 98–111)
Chloride: 95 mmol/L — ABNORMAL LOW (ref 98–111)
Chloride: 96 mmol/L — ABNORMAL LOW (ref 98–111)
Creatinine, Ser: 1.69 mg/dL — ABNORMAL HIGH (ref 0.44–1.00)
Creatinine, Ser: 1.79 mg/dL — ABNORMAL HIGH (ref 0.44–1.00)
Creatinine, Ser: 1.82 mg/dL — ABNORMAL HIGH (ref 0.44–1.00)
GFR, Estimated: 32 mL/min — ABNORMAL LOW (ref >60)
GFR, Estimated: 30 mL/min — ABNORMAL LOW (ref >60)
GFR, Estimated: 30 mL/min — ABNORMAL LOW (ref >60)
Glucose, Bld: 99 mg/dL (ref 70–99)
Glucose, Bld: 134 mg/dL — ABNORMAL HIGH (ref 70–99)
Glucose, Bld: 119 mg/dL — ABNORMAL HIGH (ref 70–99)
Potassium: 4.1 mmol/L (ref 3.5–5.1)
Potassium: 4.1 mmol/L (ref 3.5–5.1)
Potassium: 4.4 mmol/L (ref 3.5–5.1)
Sodium: 138 mmol/L (ref 135–145)
Sodium: 136 mmol/L (ref 135–145)
Sodium: 138 mmol/L (ref 135–145)

## 2023-09-07 LAB — COOXEMETRY PANEL
Carboxyhemoglobin: 1.6 % — ABNORMAL HIGH (ref 0.5–1.5)
Methemoglobin: 1 % (ref 0.0–1.5)
O2 Saturation: 75.1 %
Total hemoglobin: 13.5 g/dL (ref 12.0–16.0)

## 2023-09-07 LAB — AMMONIA: Ammonia: 81 umol/L — ABNORMAL HIGH (ref 9–35)

## 2023-09-07 LAB — MAGNESIUM: Magnesium: 2.3 mg/dL (ref 1.7–2.4)

## 2023-09-07 MED ORDER — FUROSEMIDE 10 MG/ML IJ SOLN
80.0000 mg | Freq: Once | INTRAMUSCULAR | Status: AC
  Administered 2023-09-07: 80 mg via INTRAVENOUS
  Filled 2023-09-07: qty 8

## 2023-09-07 MED ORDER — LACTULOSE 10 GM/15ML PO SOLN
30.0000 g | Freq: Three times a day (TID) | ORAL | Status: DC
Start: 2023-09-07 — End: 2023-09-08
  Administered 2023-09-07 – 2023-09-08 (×4): 30 g via ORAL
  Filled 2023-09-07 (×4): qty 45

## 2023-09-07 MED ORDER — ONDANSETRON HCL 4 MG/2ML IJ SOLN
4.0000 mg | Freq: Once | INTRAMUSCULAR | Status: AC
  Administered 2023-09-07: 4 mg via INTRAVENOUS

## 2023-09-07 MED ORDER — POTASSIUM CHLORIDE CRYS ER 20 MEQ PO TBCR
40.0000 meq | EXTENDED_RELEASE_TABLET | Freq: Once | ORAL | Status: AC
  Administered 2023-09-07: 40 meq via ORAL
  Filled 2023-09-07: qty 2

## 2023-09-07 NOTE — Progress Notes (Signed)
 Ammonia noted. Lactulose  added F/u chem noted Plan Will hold off on more diuretic for now

## 2023-09-07 NOTE — Progress Notes (Signed)
 NAME:  Whitney [name], MRN:  [id number], DOB:  1955-08-04, LOS: 13 ADMISSION DATE:  08/25/2023, CONSULTATION DATE:  08/31/2023 REFERRING MD:  Sandria Cruise - TRH, CHIEF COMPLAINT: Hypercapnic respiratory failure  History of Present Illness:  68 year old woman with a past medical history significant for NASH cirrhosis, HTN, HLD, pulmonary hypertension, Sjogren's syndrome, thrombocytopenia in the setting of cirrhosis, type 2 diabetes and anemia who presented to the ED at [facility] 4/20 for complaints of altered mental status. Workup thus far has indicated that AMS is related to hypercapnic respiratory failure for whish PCCM has been called to assist in management .   Pertinent Medical History:  NASH cirrhosis, HTN, HLD, pulmonary hypertension, Sjogren's syndrome, thrombocytopenia in the setting of cirrhosis, type 2 diabetes and anemia  Significant Hospital Events: Including procedures, antibiotic start and stop dates in addition to other pertinent events   4/20 Admitted with altered mental status being this blood gas with PCO2 69.4 4/26 PCCM consulted ABG with pH 7.29 and PCO261, moved to the intensive care.  Placed on NIPPV had not received diuretics since admission 4/27 Did have a hypoglycemic event requiring dextrose  replacement BNP was 1322 echo cardiogram review shows hyperdynamic LV with grade 3 diastolic dysfunction and elevated LAP bioprosthesis aortic valve in place there was severe TR, severe mitral valve disease and severe PAH with a dilated IVC.,  Limited follow-up echocardiogram continues to show hyperdynamic EF greater than 75% RV moderately enlarged with moderately elevated pulmonary artery systolic pressures, left atrial size dilated estimated right atrial pressure 15 mmHg, 4/28 PICC placed, norepi and later vasopressin  started. Ad HF added empiric ceftriaxone  4/29 still w/ some hypercarbia, stopped dex. Cont lasix  gtt and vasopressor support. Volume status much improved. CXR improved.  Delirium an issue. PT ordered. OOB ordered. Resumed Neurontin . 5/1 Remains on NE, Vaso. weaning pressors. Started low dose spironolactone , considering TEE/DCCV prior to dc, seen by rehab team who felt BIPAP on intermittent basis would be barrier but if only needed at HS could be considered candidate  5/2 NE stopped. Changed goal to MAP 65 but advanced HF noting "Her PAH from portopulmonary hypertension may be contributing mildly, but the main driver of her pathology is her hepatic failure" noted that should she fail pressor wean palliative consult should be considered. Lasix  and spiro stopped 5/3 stayed off norepi. MAPs >65 Still on vasopressin . Scr up a little. CVP 14-15.    Interim History / Subjective:   No distress today.  Slightly encephalopathic Objective:   Blood pressure (!) 86/68, pulse (!) 118, temperature 98.3 F (36.8 C), temperature source Axillary, resp. rate 16, height 5' 3.75" (1.619 m), weight 106 kg, SpO2 98%. CVP:  [10 mmHg-28 mmHg] 25 mmHg  Vent Mode: PCV FiO2 (%):  [40 %] 40 % Set Rate:  [17 bmp] 17 bmp PEEP:  [5 cmH20] 5 cmH20   Intake/Output Summary (Last 24 hours) at 09/07/2023 0837 Last data filed at 09/07/2023 0102 Gross per 24 hour  Intake 204.69 ml  Output 280 ml  Net -75.31 ml   Filed Weights   09/02/23 0500 09/04/23 0500 09/05/23 0500  Weight: 111.7 kg 107.1 kg 106 kg   Physical Examination:  General pleasant 68 year old female sitting up in bed no acute distress HEENT normocephalic atraumatic no jugular venous distention Pulmonary: Bibasilar crackles, portable chest x-ray personally reviewed shows increased pulmonary edema compared to prior film currently on 2 L/min without accessory use Cardiac: Irregular irregular atrial fibrillation with episodes up in the 1 15-1 20s diffuse holosystolic  heart murmur noted Abdomen soft nontender Extremities: Warm dry brisk cap refill no significant edema Abdomen: Soft nontender no organomegaly Neuro: Awake, oriented  x 2-3, trails off at times will perseverate on different thoughts, generalized weakness but no focal deficits GU clear yellow via Foley catheter. Ancillary Tests Personally Reviewed:     Assessment & Plan:   Acute-on-chronic hypoxic and hypercapnic respiratory failure with associated metabolic encephalopathy, secondary to volume overload, superimposed on underlying COPD and OSA. plan Repeat cxr today cont BiPAP QHS + PRN Supplemental O2 for sat > 90% (now on 2 lpm) Cont Bronchodilators (Brovana /Yupelri , albuterol  PRN) Pulmonary hygiene (IS, encourage OOB/mobility) Diuresis as tolerated (Lasix /Diamox ), monitor I&Os Lasix  today   Acute-on-chronic HFpEF (grade III diastolic HF), high-output Pulmonary HTN S/p AVR (bioprosthetic) w/ on-going valvular disease (gradient high for AVR) plan AHF following, appreciate recommendations Goal MAP > 60 Cont to titrate vaso  Cont high dose midodrine  Trend CVP, Co-ox Cardiac monitoring  History of PAF Plan Cardiac monitoring Optimize electrolytes for K > 4, Mg > 2 Eliquis  for AC Possible TEE at some point, however risk may outweigh benefit with known EV  Acute metabolic encephalopathy, r/t hypercarbia and prob also some degree of hepatic encephalopathy (ammonia was elevated)  Plan Will continue low-dose gabapentin  Consider low-dose Seroquel at bedtime to promote sleep/wake Repeat ammonia today. May be reasonable to start lactulose   BiPAP as above Correct metabolic derangements as able Limit interruptions to normal sleep/wake cycle Encourage OOB, mobilization  AKI, likely in the setting of HF Serum creatinine had been improving. Bumped a little today. CVP suggest would benefit from diuretics  Plan Ensure MAP goal  Renal dose meds Strict I&O Trend chems    NASH cirrhosis Follows at [facility]. Last EGD 2021 with grade 2 EV. Plan Trend LFTs Nadolol  held Diuresis as tolerated  Hyperglycemia plan SSI CBGs ACHS (ensure sensitive  scale) Goal CBG 140-180  Mild thrombocytopenia likely secondary to underlying cirrhosis currently stable plan Trend CBC Monitor for signs of active bleeding Transfuse for Plt < 20K  H/o Sjogren's syndrome Plan Continue Plaquenil   GOC - FULL CODE at present - Given progressive and compounding diagnoses of high-output HF, pHTN, NASH cirrhosis, discussion with patient/family re: long term outcomes warranted - Consider PMT consult if family open to this  Best Practice (right click and "Reselect all SmartList Selections" daily)   Diet/type: Regular consistency (see orders) DVT prophylaxis: SCDs Start: 08/25/23 1931 apixaban  (ELIQUIS ) tablet 5 mg   Pressure ulcers present: N/A GI prophylaxis: N/A Lines: Central line Foley:  Yes, and it is still needed Code Status:  full code Last date of multidisciplinary goals of care discussion [5/2 - bedside discussion with PCCM/AHF]  Critical care time: 32 min

## 2023-09-08 ENCOUNTER — Inpatient Hospital Stay (HOSPITAL_COMMUNITY)

## 2023-09-08 DIAGNOSIS — G9340 Encephalopathy, unspecified: Secondary | ICD-10-CM | POA: Diagnosis not present

## 2023-09-08 DIAGNOSIS — G9341 Metabolic encephalopathy: Secondary | ICD-10-CM | POA: Diagnosis not present

## 2023-09-08 DIAGNOSIS — Z515 Encounter for palliative care: Secondary | ICD-10-CM | POA: Diagnosis not present

## 2023-09-08 DIAGNOSIS — J9622 Acute and chronic respiratory failure with hypercapnia: Secondary | ICD-10-CM | POA: Diagnosis not present

## 2023-09-08 DIAGNOSIS — Z7189 Other specified counseling: Secondary | ICD-10-CM

## 2023-09-08 DIAGNOSIS — K7581 Nonalcoholic steatohepatitis (NASH): Secondary | ICD-10-CM | POA: Diagnosis not present

## 2023-09-08 DIAGNOSIS — J9621 Acute and chronic respiratory failure with hypoxia: Secondary | ICD-10-CM | POA: Diagnosis not present

## 2023-09-08 LAB — POCT I-STAT 7, (LYTES, BLD GAS, ICA,H+H)
Acid-Base Excess: 7 mmol/L — ABNORMAL HIGH (ref 0.0–2.0)
Bicarbonate: 35.4 mmol/L — ABNORMAL HIGH (ref 20.0–28.0)
Calcium, Ion: 1.25 mmol/L (ref 1.15–1.40)
HCT: 41 % (ref 36.0–46.0)
Hemoglobin: 13.9 g/dL (ref 12.0–15.0)
O2 Saturation: 98 %
Potassium: 3.9 mmol/L (ref 3.5–5.1)
Sodium: 141 mmol/L (ref 135–145)
TCO2: 37 mmol/L — ABNORMAL HIGH (ref 22–32)
pCO2 arterial: 64.6 mmHg — ABNORMAL HIGH (ref 32–48)
pH, Arterial: 7.347 — ABNORMAL LOW (ref 7.35–7.45)
pO2, Arterial: 110 mmHg — ABNORMAL HIGH (ref 83–108)

## 2023-09-08 LAB — AMMONIA: Ammonia: 111 umol/L — ABNORMAL HIGH (ref 9–35)

## 2023-09-08 LAB — CBC
HCT: 40.4 % (ref 36.0–46.0)
Hemoglobin: 12.7 g/dL (ref 12.0–15.0)
MCH: 30.4 pg (ref 26.0–34.0)
MCHC: 31.4 g/dL (ref 30.0–36.0)
MCV: 96.7 fL (ref 80.0–100.0)
Platelets: 52 10*3/uL — ABNORMAL LOW (ref 150–400)
RBC: 4.18 MIL/uL (ref 3.87–5.11)
RDW: 17.1 % — ABNORMAL HIGH (ref 11.5–15.5)
WBC: 5.3 10*3/uL (ref 4.0–10.5)
nRBC: 0 % (ref 0.0–0.2)

## 2023-09-08 LAB — BASIC METABOLIC PANEL WITH GFR
Anion gap: 9 (ref 5–15)
Anion gap: 10 (ref 5–15)
BUN: 53 mg/dL — ABNORMAL HIGH (ref 8–23)
BUN: 50 mg/dL — ABNORMAL HIGH (ref 8–23)
CO2: 33 mmol/L — ABNORMAL HIGH (ref 22–32)
CO2: 31 mmol/L (ref 22–32)
Calcium: 9.3 mg/dL (ref 8.9–10.3)
Calcium: 9.2 mg/dL (ref 8.9–10.3)
Chloride: 99 mmol/L (ref 98–111)
Chloride: 99 mmol/L (ref 98–111)
Creatinine, Ser: 1.87 mg/dL — ABNORMAL HIGH (ref 0.44–1.00)
Creatinine, Ser: 1.77 mg/dL — ABNORMAL HIGH (ref 0.44–1.00)
GFR, Estimated: 29 mL/min — ABNORMAL LOW (ref >60)
GFR, Estimated: 31 mL/min — ABNORMAL LOW (ref >60)
Glucose, Bld: 84 mg/dL (ref 70–99)
Glucose, Bld: 105 mg/dL — ABNORMAL HIGH (ref 70–99)
Potassium: 4.1 mmol/L (ref 3.5–5.1)
Potassium: 3.9 mmol/L (ref 3.5–5.1)
Sodium: 141 mmol/L (ref 135–145)
Sodium: 140 mmol/L (ref 135–145)

## 2023-09-08 LAB — MAGNESIUM: Magnesium: 2.5 mg/dL — ABNORMAL HIGH (ref 1.7–2.4)

## 2023-09-08 LAB — COOXEMETRY PANEL
Carboxyhemoglobin: 1.8 % — ABNORMAL HIGH (ref 0.5–1.5)
Methemoglobin: 1.2 % (ref 0.0–1.5)
O2 Saturation: 80.4 %
Total hemoglobin: 13.5 g/dL (ref 12.0–16.0)

## 2023-09-08 LAB — PHOSPHORUS: Phosphorus: 3.4 mg/dL (ref 2.5–4.6)

## 2023-09-08 LAB — GLUCOSE, CAPILLARY
Glucose-Capillary: 69 mg/dL — ABNORMAL LOW (ref 70–99)
Glucose-Capillary: 156 mg/dL — ABNORMAL HIGH (ref 70–99)
Glucose-Capillary: 144 mg/dL — ABNORMAL HIGH (ref 70–99)
Glucose-Capillary: 126 mg/dL — ABNORMAL HIGH (ref 70–99)

## 2023-09-08 MED ORDER — MELATONIN 3 MG PO TABS: 3.0000 mg | ORAL_TABLET | Freq: Every evening | ORAL | Status: DC | PRN

## 2023-09-08 MED ORDER — LACTULOSE 10 GM/15ML PO SOLN
20.0000 g | ORAL | Status: DC
  Administered 2023-09-08 – 2023-09-09 (×8): 20 g
  Filled 2023-09-08 (×8): qty 30

## 2023-09-08 MED ORDER — ALUM & MAG HYDROXIDE-SIMETH 200-200-20 MG/5ML PO SUSP
30.0000 mL | Freq: Once | ORAL | Status: AC
  Administered 2023-09-08: 30 mL via ORAL
  Filled 2023-09-08: qty 30

## 2023-09-08 MED ORDER — ACETAMINOPHEN 650 MG RE SUPP: 650.0000 mg | Freq: Four times a day (QID) | RECTAL | Status: DC | PRN

## 2023-09-08 MED ORDER — MIDODRINE HCL 5 MG PO TABS
15.0000 mg | ORAL_TABLET | Freq: Three times a day (TID) | ORAL | Status: DC
  Administered 2023-09-09 (×2): 15 mg
  Filled 2023-09-08 (×2): qty 3

## 2023-09-08 MED ORDER — HYDROXYCHLOROQUINE SULFATE 200 MG PO TABS
200.0000 mg | ORAL_TABLET | Freq: Every day | ORAL | Status: DC
  Administered 2023-09-09: 200 mg
  Filled 2023-09-08: qty 1

## 2023-09-08 MED ORDER — APIXABAN 5 MG PO TABS
5.0000 mg | ORAL_TABLET | Freq: Two times a day (BID) | ORAL | Status: DC
  Administered 2023-09-08 – 2023-09-09 (×2): 5 mg
  Filled 2023-09-08 (×2): qty 1

## 2023-09-08 MED ORDER — FUROSEMIDE 10 MG/ML IJ SOLN
80.0000 mg | Freq: Two times a day (BID) | INTRAMUSCULAR | Status: AC
  Administered 2023-09-08 (×2): 80 mg via INTRAVENOUS
  Filled 2023-09-08 (×2): qty 8

## 2023-09-08 MED ORDER — EZETIMIBE 10 MG PO TABS
10.0000 mg | ORAL_TABLET | Freq: Every day | ORAL | Status: DC
  Administered 2023-09-08: 10 mg
  Filled 2023-09-08: qty 1

## 2023-09-08 MED ORDER — GABAPENTIN 250 MG/5ML PO SOLN
100.0000 mg | Freq: Three times a day (TID) | ORAL | Status: DC
  Administered 2023-09-08 – 2023-09-09 (×2): 100 mg
  Filled 2023-09-08 (×4): qty 2

## 2023-09-08 MED ORDER — ALUM & MAG HYDROXIDE-SIMETH 200-200-20 MG/5ML PO SUSP: 15.0000 mL | Freq: Four times a day (QID) | ORAL | Status: DC | PRN

## 2023-09-08 MED ORDER — ACETAMINOPHEN 325 MG PO TABS: 650.0000 mg | ORAL_TABLET | Freq: Four times a day (QID) | ORAL | Status: DC | PRN

## 2023-09-08 MED ORDER — CALCIUM CARBONATE ANTACID 500 MG PO CHEW
200.0000 mg | CHEWABLE_TABLET | Freq: Three times a day (TID) | ORAL | Status: DC
  Administered 2023-09-08 – 2023-09-09 (×2): 200 mg
  Filled 2023-09-08 (×2): qty 1

## 2023-09-08 MED ORDER — ADULT MULTIVITAMIN W/MINERALS CH
1.0000 | ORAL_TABLET | Freq: Two times a day (BID) | ORAL | Status: DC
  Administered 2023-09-08 – 2023-09-09 (×2): 1
  Filled 2023-09-08 (×2): qty 1

## 2023-09-08 MED ORDER — POLYETHYLENE GLYCOL 3350 17 G PO PACK: 17.0000 g | PACK | Freq: Every day | ORAL | Status: DC

## 2023-09-08 MED ORDER — CARMEX CLASSIC LIP BALM EX OINT
TOPICAL_OINTMENT | CUTANEOUS | Status: DC | PRN
  Filled 2023-09-08: qty 10

## 2023-09-08 MED ORDER — HYOSCYAMINE SULFATE 0.125 MG SL SUBL
0.2500 mg | SUBLINGUAL_TABLET | Freq: Once | SUBLINGUAL | Status: AC
  Administered 2023-09-08: 0.25 mg via SUBLINGUAL
  Filled 2023-09-08: qty 2

## 2023-09-08 MED ORDER — ROSUVASTATIN CALCIUM 20 MG PO TABS
10.0000 mg | ORAL_TABLET | Freq: Every day | ORAL | Status: DC
Start: 2023-09-09 — End: 2023-09-09
  Administered 2023-09-09: 10 mg
  Filled 2023-09-08: qty 1

## 2023-09-08 MED ORDER — SENNA 8.6 MG PO TABS
2.0000 | ORAL_TABLET | Freq: Every day | ORAL | Status: DC
  Administered 2023-09-09: 17.2 mg
  Filled 2023-09-08: qty 2

## 2023-09-08 NOTE — Progress Notes (Signed)
 CCM called to bedside to assess pt after change in mental status

## 2023-09-08 NOTE — Progress Notes (Signed)
 Hypoglycemic Event  CBG: 69  Treatment: 4 oz juice/soda  Symptoms: None  Follow-up CBG: Time:1005 CBG Result:154  Possible Reasons for Event: Inadequate meal intake  Comments/MD notified:     Aymee Fomby  Reyna-Nieblas

## 2023-09-08 NOTE — Plan of Care (Signed)
   Problem: Fluid Volume: Goal: Ability to maintain a balanced intake and output will improve Outcome: Progressing   Problem: Metabolic: Goal: Ability to maintain appropriate glucose levels will improve Outcome: Progressing   Problem: Nutritional: Goal: Maintenance of adequate nutrition will improve Outcome: Progressing   Problem: Skin Integrity: Goal: Risk for impaired skin integrity will decrease Outcome: Progressing

## 2023-09-08 NOTE — Consult Note (Signed)
 Palliative Care Consult Note                                  Date: 09/08/2023   Patient Name: Whitney [name]  DOB: 26-Mar-1956  MRN: [id number]  Age / Sex: 68 y.o., female  PCP: Jimmey Mould, MD Referring Physician: Wilfredo Hanly, MD  Reason for Consultation: Establishing goals of care  HPI/Patient Profile: 68 y.o. female  with past medical history of NASH cirrhosis, pulmonary hypertension, sjogren's syndrome, DMII, paroxysmal a-fib on Eliquis , TIA, and anemia admitted on 08/25/2023 with altered mental status, acute on chronic respiratory failure and AKI. Required vasopressor support in ICU.   Past Medical History:  Diagnosis Date   Allergic rhinitis    Anemia    hx   Arthritis    Asthma    hx yrs ago   Cirrhosis (HCC) last albumin  3.3 done at [facility] 06-16-2014 (under care everywhere tab in epic)   Secondary to Fatty liver --  followed by hepatology at [facility] (dr Orest Bio)    Depression    Diabetes mellitus type II    type 2 diet conrolled   Dyspnea    Fibromyalgia    GERD (gastroesophageal reflux disease)    Heart murmur    asymptomatic ---  1989 from rhuematic fever   History of exercise stress test    05-05-2013---  negative bruce ETT given exercise workload,  no ischemia   History of hiatal hernia    History of kidney stones    History of rheumatic fever    1989   Hyperlipidemia    Leukocytopenia    Moderate aortic stenosis    AVA area 1.1cm2---  cardiologist --  dr Armanda Lan, 2014 in epic   NASH (nonalcoholic steatohepatitis)    OSA (obstructive sleep apnea)    was using CPAP before gastric sleeve 2015--  no uses after wt loss   Pneumonia    hx   Pulmonary hypertension (HCC)    Sjogren's syndrome (HCC)    Thrombocytopenia (HCC)     Subjective:   I have reviewed medical records including EPIC notes, labs and imaging, received updates from nursing and medial team, assessed the patient and then met with the  patient and her family (son Whitney [name], daughter Whitney [name], son-in-law Whitney [name], and granddaughters Whitney [name] and Whitney [name]) to discuss diagnosis prognosis, GOC, EOL wishes, disposition and options.  I introduced Palliative Medicine as specialized medical care for people living with serious illness. It focuses on providing relief from symptoms and stress of a serious illness. The goal is to improve quality of life for both the patient and the family.  Patient faces treatment option decisions, advanced directive decisions, and anticipatory care needs.  Today's Discussion: Patient sitting up in bed in NAD with family at bedside. She reports some nausea and abdominal pain-- RN notified and Zofran  administered. Family shares that patient is much more alert today. She is oriented but quickly tires.   Family have a good understanding of the patient's chronic comorbidities and current hospitalization. We discuss that the patient will likely have rehospitalizations due to these chronic conditions. We discussed that  both liver and kidney disease can increase risk of encephalopathy and the importance of considering goals of care while the patient is alert and oriented.  Family describe patient as the family matriarch. She has two children, six grandchildren, and four great grandchildren. She enjoys cooking and baking meals for the family. Prior to hospitalization she was completing ADLs and IADLs independently. She would become short of breath at times but was very resilient in making adjustments so she could remain independent. She lives with her son Whitney [name]. She enjoys shopping and attending musicals at [facility]. They are hopeful she will be able to discharge to AIR when medically ready. We discuss outpatient palliative as a resource after discharge.   A discussion was had today regarding advanced directives. The patient has never completed ACP documents. She shared that she would want her children Whitney [name] and Whitney [name] to be here  proxy decision makers. We discussed code status. Recommended consideration of DNR status, understanding evidenced-based poor outcomes in similar hospitalized patients, as the cause of the arrest is likely associated with chronic/terminal disease rather than a reversible acute cardio-pulmonary event. Initially the patient states she would not want resuscitation. As family attempted to confirm her choice she became less certain of her response. We decided it might be too much for her to consider today. Family did want to continue goals of care discussion to gather more information. We discussed scopes of care. We discussed the difference between an aggressive medical intervention path, limited scope of care (no intubation), and a palliative comfort care path. We discussed that if the patient and family wanted to we could assist with completing a MOST form once the patient and family make goals of care decisions. For now patient remains full code and full scope of care but they would like to continue discussions around this.   Discussed the importance of continued conversation with family and the medical providers regarding overall plan of care and treatment options, ensuring decisions are within the context of the patient's values and GOCs.  Questions and concerns were addressed. Hard Choices booklet left for review. The family was encouraged to call with questions or concerns. PMT will continue to support holistically.  Review of Systems  Constitutional:  Positive for fatigue.  Gastrointestinal:  Positive for abdominal pain.    Objective:   Primary Diagnoses: Present on Admission:  Acute encephalopathy  Dog bite  Paroxysmal atrial fibrillation (HCC)  Liver cirrhosis secondary to NASH (HCC)  HLD (hyperlipidemia)  Moderate persistent asthma  Thrombocytopenia (HCC)  Cerebrovascular accident (CVA) [facility])   Physical Exam Vitals reviewed.  Constitutional:      General: She is not in acute  distress. HENT:     Head: Atraumatic.  Cardiovascular:     Rate and Rhythm: Normal rate.  Pulmonary:     Effort: Pulmonary effort is normal.  Skin:    General: Skin is warm and dry.  Neurological:     Mental Status: She is alert and oriented to person, place, and time.  Psychiatric:        Mood and Affect: Mood normal.     Vital Signs:  BP 99/74   Pulse 94   Temp (!) 96.2 F (35.7 C) (Axillary)   Resp 16   Ht 5' 3.75" (1.619 m)   Wt 106 kg   SpO2 99%   BMI 40.43 kg/m    Advanced Care Planning:   Existing Vynca/ACP Documentation: None  Primary Decision Maker: PATIENT. If patient cannot make decisions her  children Whitney [name] and Whitney [name] would be her proxy decision makers.  Code Status/Advance Care Planning: Full code   Assessment & Plan:   SUMMARY OF RECOMMENDATIONS   Full code Full scope of care Encouraged patient (when able to) and family to continue discussions around goals of care Family hopeful patient will discharge to AIR when clinically ready Outpatient palliative at discharge PMT will continue to follow    Discussed with: bedside RN and Johnathan Myron NP  Time Total: 90 minutes    Thank you for allowing us  to participate in the care of TIEGAN KATAYAMA PMT will continue to support holistically.   Signed by: Joaquim Muir, NP Palliative Medicine Team  Team Phone # [phone] (Nights/Weekends)  09/08/2023, 1:50 PM

## 2023-09-08 NOTE — Progress Notes (Signed)
 Abg reviewed  Do not really think this is driving her mental status Ammonia repeated.  Awaiting chem Plan Will rest her on NIPPV but otherwise no sig change in care.

## 2023-09-08 NOTE — Progress Notes (Signed)
 NAME:  Whitney [name], MRN:  [id number], DOB:  1955-08-06, LOS: 14 ADMISSION DATE:  08/25/2023, CONSULTATION DATE:  08/31/2023 REFERRING MD:  Sandria Cruise - TRH, CHIEF COMPLAINT: Hypercapnic respiratory failure  History of Present Illness:  68 year old woman with a past medical history significant for NASH cirrhosis, HTN, HLD, pulmonary hypertension, Sjogren's syndrome, thrombocytopenia in the setting of cirrhosis, type 2 diabetes and anemia who presented to the ED at [facility] 4/20 for complaints of altered mental status. Workup thus far has indicated that AMS is related to hypercapnic respiratory failure for whish PCCM has been called to assist in management .   Pertinent Medical History:  NASH cirrhosis, HTN, HLD, pulmonary hypertension, Sjogren's syndrome, thrombocytopenia in the setting of cirrhosis, type 2 diabetes and anemia  Significant Hospital Events: Including procedures, antibiotic start and stop dates in addition to other pertinent events   4/20 Admitted with altered mental status being this blood gas with PCO2 69.4 4/26 PCCM consulted ABG with pH 7.29 and PCO261, moved to the intensive care.  Placed on NIPPV had not received diuretics since admission 4/27 Did have a hypoglycemic event requiring dextrose  replacement BNP was 1322 echo cardiogram review shows hyperdynamic LV with grade 3 diastolic dysfunction and elevated LAP bioprosthesis aortic valve in place there was severe TR, severe mitral valve disease and severe PAH with a dilated IVC.,  Limited follow-up echocardiogram continues to show hyperdynamic EF greater than 75% RV moderately enlarged with moderately elevated pulmonary artery systolic pressures, left atrial size dilated estimated right atrial pressure 15 mmHg, 4/28 PICC placed, norepi and later vasopressin  started. Ad HF added empiric ceftriaxone  4/29 still w/ some hypercarbia, stopped dex. Cont lasix  gtt and vasopressor support. Volume status much improved. CXR improved.  Delirium an issue. PT ordered. OOB ordered. Resumed Neurontin . 5/1 Remains on NE, Vaso. weaning pressors. Started low dose spironolactone , considering TEE/DCCV prior to dc, seen by rehab team who felt BIPAP on intermittent basis would be barrier but if only needed at HS could be considered candidate  5/2 NE stopped. Changed goal to MAP 65 but advanced HF noting "Her PAH from portopulmonary hypertension may be contributing mildly, but the main driver of her pathology is her hepatic failure" noted that should she fail pressor wean palliative consult should be considered. Lasix  and spiro stopped 5/3 stayed off norepi. MAPs >65 Still on vasopressin . Scr up a little. CVP 14-15.  Ammonia level was 80, started lactulose .  Got a dose of Lasix  5/4 chest x-ray improved following diuretics, a little more awake.  Vasopressin  off   Interim History / Subjective:  More briskly awake today.  Feels better.  No distress.  She has just been discontinued from the vasopressin  this morning Objective:   Blood pressure 99/74, pulse 94, temperature (!) 96.2 F (35.7 C), temperature source Axillary, resp. rate 16, height 5' 3.75" (1.619 m), weight 106 kg, SpO2 99%. CVP:  [6 mmHg-21 mmHg] 13 mmHg  Vent Mode: BIPAP FiO2 (%):  [40 %] 40 % Set Rate:  [18 bmp] 18 bmp   Intake/Output Summary (Last 24 hours) at 09/08/2023 0814 Last data filed at 09/07/2023 2000 Gross per 24 hour  Intake 43.2 ml  Output 325 ml  Net -281.8 ml   Filed Weights   09/02/23 0500 09/04/23 0500 09/05/23 0500  Weight: 111.7 kg 107.1 kg 106 kg   Physical Examination: General This a pleasant 68 year old female patient sitting up in bed she is in no acute distress this morning HEENT normocephalic atraumatic no jugular venous  distention is appreciated Pulmonary: Little diminished in the bases no accessory use remains on nasal cannula support Portable chest x-ray obtained this morning, compared with film day prior showing significant decrease in  pulmonary edema following diuresis Cardiac: Irregular irregular with holosystolic heart murmur Abdomen soft nontender no organomegaly Extremities: Warm dry brisk cap refill,  neuro awake oriented x 3 today but again still intermittently has flight of ideas and rambles on different topics.  I see no focal motor deficits Ancillary Tests Personally Reviewed:     Assessment & Plan:   Acute-on-chronic hypoxic and hypercapnic respiratory failure with associated metabolic encephalopathy, secondary to volume overload, superimposed on underlying COPD and OSA. - Had worsening edema on film on 5/3 this improved with IV diuresis, she was -270 mL yesterday, I think we need to at least keep her euvolemic plan Repeat cxr today cont BiPAP QHS + PRN Supplemental O2 for sat > 90% (now on 2 lpm) Cont Bronchodilators (Brovana /Yupelri , albuterol  PRN) Pulmonary hygiene (IS, encourage OOB/mobility) Diuresis as tolerated (Lasix /Diamox ), monitor I&Os Lasix  today need to keep her euvolemic  Acute-on-chronic HFpEF (grade III diastolic HF), high-output Pulmonary HTN S/p AVR (bioprosthetic) w/ on-going valvular disease (gradient high for AVR) Vasopressin  now off  plan AHF following, appreciate recommendations Goal MAP > 60 Watch her off from the vasopressin  Cont high dose midodrine  Trend CVP, Co-ox Cardiac monitoring IV Lasix  Goals of care discussion pending  History of PAF Plan Cardiac monitoring Optimize electrolytes for K > 4, Mg > 2 Eliquis  for AC Possible TEE at some point, however risk may outweigh benefit with known EV  Acute metabolic encephalopathy, feel this is more hepatic encephalopathy driven as well as degree of ICU delirium  Initially felt hypercarbia, this seems unlikely  plan Will continue low-dose gabapentin  Discontinue sliding scale insulin , need to avoid hypoglycemia Continuing 3 times daily lactulose  Seroquel has been discontinued Nighttime BiPAP Ensure normal sleep-wake  cycles Mobilize as able   AKI, likely in the setting of HF Serum creatinine had been improving.  It is worse again today.   Plan Ensure MAP goal  Renal dose meds IV Lasix  Serial chemistries Strict I&O  NASH cirrhosis Follows at [facility]. Last EGD 2021 with grade 2 EV. Plan Trend LFTs, will obtain in a.m. Nadolol  held Diuresis as tolerated  Hyperglycemia Had some mild hypoglycemia in the a.m. of 5/4 plan I am going to discontinue her sliding scale Continue CBGs ACHS  Mild thrombocytopenia likely secondary to underlying cirrhosis currently stable, actually improving plan Trend CBC Monitor for signs of active bleeding Would only consider transfusion if needed significantly invasive procedure which seems unlikely  H/o Sjogren's syndrome Plan Continue Plaquenil   GOC - FULL CODE at present - Given progressive and compounding diagnoses of high-output HF, pHTN, NASH cirrhosis, discussion with patient/family re: long term outcomes warranted - Palliative care medicine consult today  Best Practice (right click and "Reselect all SmartList Selections" daily)   Diet/type: Regular consistency (see orders) DVT prophylaxis: SCDs Start: 08/25/23 1931 apixaban  (ELIQUIS ) tablet 5 mg   Pressure ulcers present: N/A GI prophylaxis: N/A Lines: Central line Foley:  Yes, and it is still needed Code Status:  full code Last date of multidisciplinary goals of care discussion [5/2 - bedside discussion with PCCM/AHF]  Critical care time: NA. My time 43 minutes

## 2023-09-09 DIAGNOSIS — L8990 Pressure ulcer of unspecified site, unspecified stage: Secondary | ICD-10-CM | POA: Insufficient documentation

## 2023-09-09 DIAGNOSIS — I5030 Unspecified diastolic (congestive) heart failure: Secondary | ICD-10-CM

## 2023-09-09 DIAGNOSIS — E119 Type 2 diabetes mellitus without complications: Secondary | ICD-10-CM | POA: Diagnosis not present

## 2023-09-09 DIAGNOSIS — Z515 Encounter for palliative care: Secondary | ICD-10-CM | POA: Diagnosis not present

## 2023-09-09 DIAGNOSIS — J9622 Acute and chronic respiratory failure with hypercapnia: Secondary | ICD-10-CM | POA: Diagnosis not present

## 2023-09-09 DIAGNOSIS — I5033 Acute on chronic diastolic (congestive) heart failure: Secondary | ICD-10-CM

## 2023-09-09 DIAGNOSIS — G9340 Encephalopathy, unspecified: Secondary | ICD-10-CM | POA: Diagnosis not present

## 2023-09-09 DIAGNOSIS — J9621 Acute and chronic respiratory failure with hypoxia: Secondary | ICD-10-CM | POA: Diagnosis not present

## 2023-09-09 DIAGNOSIS — K7581 Nonalcoholic steatohepatitis (NASH): Secondary | ICD-10-CM | POA: Diagnosis not present

## 2023-09-09 LAB — MAGNESIUM: Magnesium: 2.5 mg/dL — ABNORMAL HIGH (ref 1.7–2.4)

## 2023-09-09 LAB — COMPREHENSIVE METABOLIC PANEL WITH GFR
ALT: 17 U/L (ref 0–44)
AST: 70 U/L — ABNORMAL HIGH (ref 15–41)
Albumin: 2.8 g/dL — ABNORMAL LOW (ref 3.5–5.0)
Alkaline Phosphatase: 72 U/L (ref 38–126)
Anion gap: 11 (ref 5–15)
BUN: 48 mg/dL — ABNORMAL HIGH (ref 8–23)
CO2: 33 mmol/L — ABNORMAL HIGH (ref 22–32)
Calcium: 9.5 mg/dL (ref 8.9–10.3)
Chloride: 101 mmol/L (ref 98–111)
Creatinine, Ser: 1.84 mg/dL — ABNORMAL HIGH (ref 0.44–1.00)
GFR, Estimated: 29 mL/min — ABNORMAL LOW (ref >60)
Glucose, Bld: 116 mg/dL — ABNORMAL HIGH (ref 70–99)
Potassium: 3.5 mmol/L (ref 3.5–5.1)
Sodium: 145 mmol/L (ref 135–145)
Total Bilirubin: 2.3 mg/dL — ABNORMAL HIGH (ref 0.0–1.2)
Total Protein: 6.2 g/dL — ABNORMAL LOW (ref 6.5–8.1)

## 2023-09-09 LAB — COOXEMETRY PANEL
Carboxyhemoglobin: 2.2 % — ABNORMAL HIGH (ref 0.5–1.5)
Methemoglobin: <0.7 % (ref 0.0–1.5)
O2 Saturation: 77.5 %
Total hemoglobin: 14.2 g/dL (ref 12.0–16.0)

## 2023-09-09 LAB — GLUCOSE, CAPILLARY
Glucose-Capillary: 108 mg/dL — ABNORMAL HIGH (ref 70–99)
Glucose-Capillary: 133 mg/dL — ABNORMAL HIGH (ref 70–99)
Glucose-Capillary: 131 mg/dL — ABNORMAL HIGH (ref 70–99)
Glucose-Capillary: 138 mg/dL — ABNORMAL HIGH (ref 70–99)

## 2023-09-09 LAB — AMMONIA: Ammonia: 133 umol/L — ABNORMAL HIGH (ref 9–35)

## 2023-09-09 LAB — CBC
HCT: 45.2 % (ref 36.0–46.0)
Hemoglobin: 14.1 g/dL (ref 12.0–15.0)
MCH: 30.2 pg (ref 26.0–34.0)
MCHC: 31.2 g/dL (ref 30.0–36.0)
MCV: 96.8 fL (ref 80.0–100.0)
Platelets: 51 10*3/uL — ABNORMAL LOW (ref 150–400)
RBC: 4.67 MIL/uL (ref 3.87–5.11)
RDW: 17.2 % — ABNORMAL HIGH (ref 11.5–15.5)
WBC: 6.3 10*3/uL (ref 4.0–10.5)
nRBC: 0 % (ref 0.0–0.2)

## 2023-09-09 MED ORDER — POTASSIUM CHLORIDE 10 MEQ/100ML IV SOLN
10.0000 meq | INTRAVENOUS | Status: DC
  Administered 2023-09-09: 10 meq via INTRAVENOUS
  Filled 2023-09-09 (×2): qty 100

## 2023-09-09 MED ORDER — LACTULOSE 10 GM/15ML PO SOLN: 30.0000 g | Freq: Three times a day (TID) | ORAL | Status: DC

## 2023-09-09 MED ORDER — MELATONIN 3 MG PO TABS
3.0000 mg | ORAL_TABLET | Freq: Every evening | ORAL | Status: DC | PRN
  Administered 2023-09-10: 3 mg via ORAL
  Filled 2023-09-09: qty 1

## 2023-09-09 MED ORDER — POLYETHYLENE GLYCOL 3350 17 G PO PACK
17.0000 g | PACK | Freq: Every day | ORAL | Status: DC
  Administered 2023-09-13: 17 g via ORAL
  Filled 2023-09-09: qty 1

## 2023-09-09 MED ORDER — ENSURE ENLIVE PO LIQD
237.0000 mL | Freq: Two times a day (BID) | ORAL | Status: DC
  Administered 2023-09-11: 237 mL via ORAL

## 2023-09-09 MED ORDER — ALUM & MAG HYDROXIDE-SIMETH 200-200-20 MG/5ML PO SUSP: 15.0000 mL | Freq: Four times a day (QID) | ORAL | Status: DC | PRN

## 2023-09-09 MED ORDER — POTASSIUM CHLORIDE 10 MEQ/50ML IV SOLN
10.0000 meq | INTRAVENOUS | Status: AC
  Administered 2023-09-09 (×3): 10 meq via INTRAVENOUS
  Filled 2023-09-09 (×3): qty 50

## 2023-09-09 MED ORDER — POTASSIUM CHLORIDE 20 MEQ PO PACK
40.0000 meq | PACK | Freq: Once | ORAL | Status: AC
  Administered 2023-09-09: 40 meq via ORAL
  Filled 2023-09-09: qty 2

## 2023-09-09 MED ORDER — ACETAMINOPHEN 325 MG PO TABS
650.0000 mg | ORAL_TABLET | Freq: Four times a day (QID) | ORAL | Status: DC | PRN
Start: 2023-09-09 — End: 2023-09-13
  Administered 2023-09-13: 650 mg via ORAL
  Filled 2023-09-09: qty 2

## 2023-09-09 MED ORDER — ACETAMINOPHEN 650 MG RE SUPP: 650.0000 mg | Freq: Four times a day (QID) | RECTAL | Status: DC | PRN

## 2023-09-09 MED ORDER — MIDODRINE HCL 5 MG PO TABS
15.0000 mg | ORAL_TABLET | Freq: Three times a day (TID) | ORAL | Status: DC
  Administered 2023-09-09 – 2023-09-13 (×12): 15 mg via ORAL
  Filled 2023-09-09 (×13): qty 3

## 2023-09-09 MED ORDER — RIFAXIMIN 550 MG PO TABS
550.0000 mg | ORAL_TABLET | Freq: Two times a day (BID) | ORAL | Status: DC
  Administered 2023-09-09 – 2023-09-13 (×9): 550 mg via ORAL
  Filled 2023-09-09 (×10): qty 1

## 2023-09-09 MED ORDER — CALCIUM CARBONATE ANTACID 500 MG PO CHEW
200.0000 mg | CHEWABLE_TABLET | Freq: Three times a day (TID) | ORAL | Status: DC
  Administered 2023-09-09 – 2023-09-10 (×3): 200 mg via ORAL
  Filled 2023-09-09 (×3): qty 1

## 2023-09-09 MED ORDER — POTASSIUM CHLORIDE 20 MEQ PO PACK: 40.0000 meq | PACK | Freq: Once | ORAL | Status: DC

## 2023-09-09 MED ORDER — EZETIMIBE 10 MG PO TABS
10.0000 mg | ORAL_TABLET | Freq: Every day | ORAL | Status: DC
  Administered 2023-09-09 – 2023-09-12 (×4): 10 mg via ORAL
  Filled 2023-09-09 (×5): qty 1

## 2023-09-09 MED ORDER — LACTULOSE 10 GM/15ML PO SOLN
30.0000 g | Freq: Three times a day (TID) | ORAL | Status: DC
  Administered 2023-09-09 – 2023-09-12 (×9): 30 g via ORAL
  Filled 2023-09-09 (×9): qty 45

## 2023-09-09 MED ORDER — HYDROXYCHLOROQUINE SULFATE 200 MG PO TABS
200.0000 mg | ORAL_TABLET | Freq: Every day | ORAL | Status: DC
  Administered 2023-09-09 – 2023-09-13 (×5): 200 mg via ORAL
  Filled 2023-09-09 (×5): qty 1

## 2023-09-09 MED ORDER — APIXABAN 5 MG PO TABS
5.0000 mg | ORAL_TABLET | Freq: Two times a day (BID) | ORAL | Status: DC
  Administered 2023-09-09 – 2023-09-12 (×7): 5 mg via ORAL
  Filled 2023-09-09 (×7): qty 1

## 2023-09-09 MED ORDER — POTASSIUM CHLORIDE 20 MEQ PO PACK
40.0000 meq | PACK | Freq: Once | ORAL | Status: DC
Start: 2023-09-09 — End: 2023-09-09

## 2023-09-09 MED ORDER — ENSURE ENLIVE PO LIQD: 237.0000 mL | Freq: Two times a day (BID) | ORAL | Status: DC

## 2023-09-09 MED ORDER — POTASSIUM CHLORIDE 20 MEQ PO PACK
40.0000 meq | PACK | Freq: Once | ORAL | Status: AC
  Administered 2023-09-09: 40 meq
  Filled 2023-09-09: qty 2

## 2023-09-09 MED ORDER — ADULT MULTIVITAMIN W/MINERALS CH
1.0000 | ORAL_TABLET | Freq: Two times a day (BID) | ORAL | Status: DC
  Administered 2023-09-09 – 2023-09-13 (×9): 1 via ORAL
  Filled 2023-09-09 (×9): qty 1

## 2023-09-09 MED ORDER — GABAPENTIN 100 MG PO CAPS
100.0000 mg | ORAL_CAPSULE | Freq: Three times a day (TID) | ORAL | Status: DC
  Administered 2023-09-09 – 2023-09-13 (×12): 100 mg via ORAL
  Filled 2023-09-09 (×12): qty 1

## 2023-09-09 MED ORDER — TORSEMIDE 20 MG PO TABS
80.0000 mg | ORAL_TABLET | Freq: Every day | ORAL | Status: DC
  Administered 2023-09-09 – 2023-09-11 (×3): 80 mg via ORAL
  Filled 2023-09-09 (×3): qty 4

## 2023-09-09 MED ORDER — ROSUVASTATIN CALCIUM 5 MG PO TABS
10.0000 mg | ORAL_TABLET | Freq: Every day | ORAL | Status: DC
  Administered 2023-09-09 – 2023-09-13 (×5): 10 mg via ORAL
  Filled 2023-09-09 (×5): qty 1
  Filled 2023-09-09: qty 2

## 2023-09-09 MED ORDER — FUROSEMIDE 10 MG/ML IJ SOLN
80.0000 mg | Freq: Once | INTRAMUSCULAR | Status: AC
  Administered 2023-09-09: 80 mg via INTRAVENOUS
  Filled 2023-09-09: qty 8

## 2023-09-09 MED ORDER — SENNA 8.6 MG PO TABS
2.0000 | ORAL_TABLET | Freq: Every day | ORAL | Status: DC
  Administered 2023-09-09 – 2023-09-13 (×2): 17.2 mg via ORAL
  Filled 2023-09-09 (×2): qty 2

## 2023-09-09 NOTE — Progress Notes (Signed)
 NAME:  Whitney [name], MRN:  [id number], DOB:  1955-09-13, LOS: 15 ADMISSION DATE:  08/25/2023, CONSULTATION DATE:  08/31/2023 REFERRING MD:  Sandria Cruise - TRH, CHIEF COMPLAINT: Hypercapnic respiratory failure  History of Present Illness:  68 year old woman with a past medical history significant for NASH cirrhosis, HTN, HLD, pulmonary hypertension, Sjogren's syndrome, thrombocytopenia in the setting of cirrhosis, type 2 diabetes and anemia who presented to the ED at [facility] 4/20 for complaints of altered mental status. Workup thus far has indicated that AMS is related to hypercapnic respiratory failure for whish PCCM has been called to assist in management .   Pertinent Medical History:  NASH cirrhosis, HTN, HLD, pulmonary hypertension, Sjogren's syndrome, thrombocytopenia in the setting of cirrhosis, type 2 diabetes and anemia  Significant Hospital Events: Including procedures, antibiotic start and stop dates in addition to other pertinent events   4/20 Admitted with altered mental status being this blood gas with PCO2 69.4 4/26 PCCM consulted ABG with pH 7.29 and PCO261, moved to the intensive care.  Placed on NIPPV had not received diuretics since admission 4/27 Did have a hypoglycemic event requiring dextrose  replacement BNP was 1322 echo cardiogram review shows hyperdynamic LV with grade 3 diastolic dysfunction and elevated LAP bioprosthesis aortic valve in place there was severe TR, severe mitral valve disease and severe PAH with a dilated IVC.,  Limited follow-up echocardiogram continues to show hyperdynamic EF greater than 75% RV moderately enlarged with moderately elevated pulmonary artery systolic pressures, left atrial size dilated estimated right atrial pressure 15 mmHg, 4/28 PICC placed, norepi and later vasopressin  started. Ad HF added empiric ceftriaxone  4/29 still w/ some hypercarbia, stopped dex. Cont lasix  gtt and vasopressor support. Volume status much improved. CXR improved.  Delirium an issue. PT ordered. OOB ordered. Resumed Neurontin . 5/1 Remains on NE, Vaso. weaning pressors. Started low dose spironolactone , considering TEE/DCCV prior to dc, seen by rehab team who felt BIPAP on intermittent basis would be barrier but if only needed at HS could be considered candidate  5/2 NE stopped. Changed goal to MAP 65 but advanced HF noting "Her PAH from portopulmonary hypertension may be contributing mildly, but the main driver of her pathology is her hepatic failure" noted that should she fail pressor wean palliative consult should be considered. Lasix  and spiro stopped 5/3 stayed off norepi. MAPs >65 Still on vasopressin . Scr up a little. CVP 14-15.  Ammonia level was 80, started lactulose .  Got a dose of Lasix  5/4 chest x-ray improved following diuretics, a little more awake.  Vasopressin  off   Interim History / Subjective:  No acute events  Objective:   Blood pressure 113/82, pulse (!) 112, temperature 98.2 F (36.8 C), temperature source Oral, resp. rate 16, height 5' 3.75" (1.619 m), weight 100.2 kg, SpO2 100%. CVP:  [14 mmHg-22 mmHg] 14 mmHg  Vent Mode: BIPAP FiO2 (%):  [40 %] 40 % Set Rate:  [18 bmp] 18 bmp   Intake/Output Summary (Last 24 hours) at 09/09/2023 1048 Last data filed at 09/09/2023 0600 Gross per 24 hour  Intake --  Output 1800 ml  Net -1800 ml   Filed Weights   09/04/23 0500 09/05/23 0500 09/09/23 0500  Weight: 107.1 kg 106 kg 100.2 kg   Physical Examination:  General:  elderly female in NAD Neuro:  Awake, alert, oriented but does have some trouble recalling recent events.  HEENT:  [place]/AT, No JVD noted, PERRL Cardiovascular:  IRIR, rate controlled Lungs:  Clear Abdomen:  Soft, non-distended, non-tender. Flexi-seal in  place draining liquid stool.  Musculoskeletal:  No acute deformity or significant edema.  Skin:  Intact, MMM   Ancillary Tests Personally Reviewed:     Assessment & Plan:   Acute-on-chronic hypoxic and hypercapnic  respiratory failure with associated metabolic encephalopathy, secondary to volume overload, superimposed on underlying COPD and OSA. Plan Supplemental O2 for sat > 90% (now on 3 lpm) Continue Bronchodilators (Brovana /Yupelri , albuterol  PRN) Pulmonary hygiene (IS, encourage OOB/mobility) Diuresis per advanced heart failure, appreciate their assistance.  Lasix  today  Acute-on-chronic HFpEF (grade III diastolic HF), high-output Pulmonary HTN S/p AVR (bioprosthetic) w/ on-going valvular disease (gradient high for AVR) Vasopressin  now off  plan AHF following, appreciate recommendations Goal MAP > 60 Cont high dose midodrine  Goals of care discussion ongoing: appreciate PMT  Paroxysmal AF Plan Cardiac monitoring Optimize electrolytes for K > 4, Mg > 2 Eliquis  for AC Possible TEE at some point, however risk may outweigh benefit with known EV  Acute metabolic encephalopathy, feel this is more hepatic encephalopathy driven as well as degree of ICU delirium  Improved with lactulose .  plan Will continue low-dose gabapentin  Decrease lactulose  dosing as she has improved to 30mg  TID Nighttime BiPAP for hypercarbia Ensure normal sleep-wake cycles Mobilize as able  AKI, likely in the setting of HF Serum creatinine had been improving.  It is worse again today.   Plan Ensure MAP goal  Renal dose meds IV Lasix  Serial chemistries Strict I&O  NASH cirrhosis Follows at [facility]. Last EGD 2021 with grade 2 EV. Plan Trend LFTs, will obtain in a.m. Nadolol  held Diuresis as tolerated  Hyperglycemia Had some mild hypoglycemia in the a.m. of 5/4 plan I am going to discontinue her sliding scale Continue CBGs ACHS  Mild thrombocytopenia likely secondary to underlying cirrhosis currently stable, actually improving plan Trend CBC Monitor for signs of bleeding Would only consider active transfusion if needed significantly invasive procedure which seems unlikely  H/o Sjogren's  syndrome Plan Continue Plaquenil   GOC - FULL CODE at present - Given progressive and compounding diagnoses of high-output HF, pHTN, NASH cirrhosis, discussion with patient/family re: long term outcomes warranted - Palliative care medicine following  Best Practice (right click and "Reselect all SmartList Selections" daily)   Diet/type: Regular consistency (see orders) DVT prophylaxis: SCDs Start: 08/25/23 1931 apixaban  (ELIQUIS ) tablet 5 mg   Pressure ulcers present: N/A GI prophylaxis: N/A Lines: Central line Foley:  Yes, and it is still needed Code Status:  full code Last date of multidisciplinary goals of care discussion [5/2 - bedside discussion with PCCM/AHF]  Critical care time: 39 minutes    Roz Cornelia, AGACNP-BC [facility]  See Amion for personal pager PCCM on call pager [phone] until 7pm. Please call Elink 7p-7a. [phone]  09/09/2023 11:00 AM

## 2023-09-09 NOTE — Progress Notes (Addendum)
 Physical Therapy Treatment Patient Details Name: Whitney [name] MRN: [id number] DOB: 01-01-1956 Today's Date: 09/09/2023   History of Present Illness 68 yo female admitted on 08/25/23 with acute encephalopathy. Transferred to ICU 4/26 for worsening ABG/ increased lethargy.  PMH: NASH cirrhosis, DM2, PAF on eliquis , sjogren's syndrome, severe AS s/p AVR, OSA on CPAP.    PT Comments  Pt received in supine, agreeable to therapy session with encouragement, pt's daughter and granddaughter present in the room and encouraging her. Pt benefits from [employer] strategy and multimodal cues, having difficulty altering her preferred technique for transfers to accommodate RW which appears to be a less familiar device for her. Discussed with RN/family that +2 HHA and gait belt may be easier for her while in ICU to perform pivot transfers (vs Stedy) as RW difficult for her to manage, and pt has posterior instability with RW use. Pt needing up to +2 modA to perform functional mobility tasks this date, but putting forth good effort for all tasks. Pt appears fatigued once in chair. HR max ~141 bpm with standing activity, decreased to lower 120's bpm with seated breaks 1-2 mins. BP 130's/101 sitting EOB and stable from supine to sitting today. Patient will benefit from intensive inpatient follow-up therapy, >3 hours/day and has excellent family support upon DC.    If plan is discharge home, recommend the following: A little help with bathing/dressing/bathroom;Assistance with cooking/housework;Direct supervision/assist for medications management;Assist for transportation;Supervision due to cognitive status;Help with stairs or ramp for entrance;A lot of help with walking and/or transfers   Can travel by private vehicle        Equipment Recommendations  Rolling walker (2 wheels)    Recommendations for Other Services       Precautions / Restrictions Precautions Precautions: Fall Recall of  Precautions/Restrictions: Impaired Precaution/Restrictions Comments: monitor O2, BP, b/b incontinence Restrictions Weight Bearing Restrictions Per Provider Order: No     Mobility  Bed Mobility Overal bed mobility: Needs Assistance Bed Mobility: Rolling, Sidelying to Sit Rolling: Min assist, Used rails Sidelying to sit: Min assist, +2 for safety/equipment, HOB elevated, Used rails     Sit to sidelying: Min assist, Used rails General bed mobility comments: cues to roll to her R first due to lines and c/o abdominal discomfort; pt rolled then states "oh, you want me to sit up?" but still has difficulty sequencing legs and trunk, needing dense cues to sequence. HHA to assist with trunk lifting in addition to bed rail.    Transfers Overall transfer level: Needs assistance Equipment used: Rolling walker (2 wheels) (bari RW in her room) Transfers: Sit to/from Stand, Bed to chair/wheelchair/BSC Sit to Stand: Min assist, Mod assist, From elevated surface, +2 physical assistance   Step pivot transfers: Mod assist, +2 physical assistance       General transfer comment: from EOB x3 with modA +2 with posterior lean upon standing, progressing to minA after reciprocal practice. Pt ignoring cues to push from her knees or mattress to stand and becomes frustrated with hand over hand assist to demonstrate suggested body mechanics after visual/verbal demo was insufficient. Ultimately needing modA +2 for RW management and mod cues/assist to weight shift when stepping; wide BOS.    Ambulation/Gait               General Gait Details: fatigues after step pivot to chair so defer longer distance gait progression   Stairs             Wheelchair Mobility  Tilt Bed    Modified Rankin (Stroke Patients Only)       Balance Overall balance assessment: Needs assistance Sitting-balance support: No upper extremity supported, Feet supported Sitting balance-Leahy Scale: Fair Sitting  balance - Comments: supervision at EOB for safety   Standing balance support: Bilateral upper extremity supported, During functional activity Standing balance-Leahy Scale: Poor Standing balance comment: reliant on external support +2                            Communication Communication Communication: Impaired Factors Affecting Communication: Difficulty expressing self (low volume)  Cognition Arousal: Alert Behavior During Therapy: Flat affect   PT - Cognitive impairments: Orientation, Memory, Attention, Initiation, Sequencing, Problem solving, Safety/Judgement   Orientation impairments: Time, Situation                   PT - Cognition Comments: Difficulty sequencing and problem solving, pt instructed via visual/verbal demo on safe UE placement prior to standing but with poor carryover of technique. Pt then attempts hand placement per her preference with therapists allowing for imbalance to demonstrate to pt that her preferred technique is not effective, but pt with poor carryover from that attempt to subsequent standing trial. Discussed with RN and pt family that she may do better at this time with +2 HHA for sit<>stand and pivotal transfers due to cognition and poor management of RW during functional tasks. Following commands: Impaired Following commands impaired: Follows one step commands inconsistently, Follows one step commands with increased time    Cueing Cueing Techniques: Verbal cues, Gestural cues, Tactile cues, Visual cues  Exercises      General Comments General comments (skin integrity, edema, etc.): BP WFL supine to sitting, ~130's/101 sitting EOB and SpO2 WFL on 3L O2 [place]; HR elevated to ~141 bpm in standing initially, decreases to ~120 bpm with seated rest breaks. HR ~110-125 bpm during bed mobility. Pt denies dizziness/nausea with positional changes today.      Pertinent Vitals/Pain Pain Assessment Pain Assessment: Faces Faces Pain Scale: Hurts  little more Pain Location: abdomen (LLQ and middle of belly) with mobility but initially pt stated no pain at rest. Pain Descriptors / Indicators: Discomfort, Guarding Pain Intervention(s): Limited activity within patient's tolerance, Monitored during session, Repositioned    Home Living                          Prior Function            PT Goals (current goals can now be found in the care plan section) Acute Rehab PT Goals Patient Stated Goal: Per family to get stronger so she can go home with caregiver support. PT Goal Formulation: Patient unable to participate in goal setting Time For Goal Achievement: 09/17/23 Progress towards PT goals: Progressing toward goals    Frequency    Min 3X/week      PT Plan      Co-evaluation PT/OT/SLP Co-Evaluation/Treatment: Yes Reason for Co-Treatment: For patient/therapist safety;To address functional/ADL transfers;Complexity of the patient's impairments (multi-system involvement) PT goals addressed during session: Mobility/safety with mobility;Proper use of DME;Balance        AM-PAC PT "6 Clicks" Mobility   Outcome Measure  Help needed turning from your back to your side while in a flat bed without using bedrails?: A Little Help needed moving from lying on your back to sitting on the side of a flat bed without using bedrails?:  A Lot Help needed moving to and from a bed to a chair (including a wheelchair)?: A Lot Help needed standing up from a chair using your arms (e.g., wheelchair or bedside chair)?: A Lot Help needed to walk in hospital room?: Total (<34ft today) Help needed climbing 3-5 steps with a railing? : Total 6 Click Score: 11    End of Session Equipment Utilized During Treatment: Oxygen  (defer gait belt due to lines and instead utilized +2 with pt bed pad for safety/lift assist) Activity Tolerance: Patient tolerated treatment well Patient left: with call bell/phone within reach;in chair;with family/visitor  present;with chair alarm set (x2 family members present in her room) Nurse Communication: Mobility status;Other (comment) (safe technique for return transfer pt likely to do better with +2 HHA than with RW and third person for line mgmt if available) PT Visit Diagnosis: Other abnormalities of gait and mobility (R26.89)     Time: 1310-1343 PT Time Calculation (min) (ACUTE ONLY): 33 min  Charges:    $Therapeutic Activity: 8-22 mins PT General Charges $$ ACUTE PT VISIT: 1 Visit                     Anessa Charley P., PTA Acute Rehabilitation Services Secure Chat Preferred 9a-5:30pm Office: [phone]    Arville Laughter 09/09/2023, 2:50 PM

## 2023-09-09 NOTE — Progress Notes (Signed)
 Patient ID: Whitney [name], female   DOB: [birth year], 68 y.o.   MRN: [id number]    Progress Note from the Palliative Medicine Team at [facility]   Patient Name: Whitney [name]        Date: 09/09/2023 DOB: Jul 02, 1955  Age: 68 y.o. MRN#: [id number] Attending Physician: Wilfredo Hanly, MD Primary Care Physician: Jimmey Mould, MD Admit Date: 08/25/2023   Reason for Consultation/Follow-up   Establishing Goals of Care   HPI/ Brief Hospital Review  68 y.o. female  with past medical history of NASH cirrhosis, pulmonary hypertension, sjogren's syndrome, DMII, paroxysmal a-fib on Eliquis , TIA, and anemia admitted on 08/25/2023 with altered mental status, acute on chronic respiratory failure and AKI. Required vasopressor support in ICU.   Patient and family face treatment option decisions, advanced directive decisions and anticipatory care needs.   Subjective  Extensive chart review has been completed prior to meeting with patient/family  including labs, vital signs, imaging, progress/consult notes, orders, medications and available advance directive documents.    This NP assessed patient at the bedside as a follow up for palliative medicine needs and emotional support.   Patient remains intermittently confused.  Offered reassurance to patient  Return to bedside later in the day to speak with daughter and granddaughter at bedside.  I introduced my self as a provider with the palliative medicine team and the role of palliative medicine and a holistic approach to care.  Education offered regarding encephalopathy secondary to serious liver disease and elevated ammonia.  All remain hopeful to all offered and available medical interventions to prolong life all are hopeful for continued signs of improvement.  Education offered today regarding  the importance of continued conversation with family and their  medical providers regarding overall plan of care and treatment options,  ensuring  decisions are within the context of the patients values and GOCs.  Questions and concerns addressed   Discussed with primary team and nursing staff  PMT will continue to support holistically   Time: 35  minutes  Detailed review of medical records ( labs, imaging, vital signs), medically appropriate exam ( MS, skin, cardiac,  resp)   discussed with treatment team, counseling and education to patient, family, staff, documenting clinical information, medication management, coordination of care    Thena Fireman NP  Palliative Medicine Team Team Phone # [phone] Pager [phone]

## 2023-09-09 NOTE — Progress Notes (Signed)
 Occupational Therapy Treatment Patient Details Name: Whitney [name] MRN: [id number] DOB: Sep 14, 1955 Today's Date: 09/09/2023   History of present illness 68 yo female admitted on 08/25/23 with acute encephalopathy. Transferred to ICU 4/26 for worsening ABG/ increased lethargy.  PMH: NASH cirrhosis, DM2, PAF on eliquis , sjogren's syndrome, severe AS s/p AVR, OSA on CPAP.   OT comments  Patient received in supine and agreeable to OT/PT treatment. Patient's daughter and granddaughter present during session. Patient requiring min assist +2 to get to EOB with frequent cues. Patient requiring frequent cues for hand placement for sit to stand and education on body mechanics and balance. Once in recliner patient performed grooming with combing hair with patient on combing right side and requiring assistance with left even following verbal and tactile cues. Patient will benefit from intensive inpatient follow-up therapy, >3 hours/day. Acute OT to continue to follow to address established goals to facilitate DC to next venue of care.       If plan is discharge home, recommend the following:  Supervision due to cognitive status;Direct supervision/assist for financial management;Direct supervision/assist for medications management;Assist for transportation;A lot of help with bathing/dressing/bathroom;A little help with walking and/or transfers   Equipment Recommendations  Other (comment) (Potential RW if not one at home)    Recommendations for Other Services      Precautions / Restrictions Precautions Precautions: Fall Recall of Precautions/Restrictions: Impaired Precaution/Restrictions Comments: monitor O2, BP, b/b incontinence Restrictions Weight Bearing Restrictions Per Provider Order: No       Mobility Bed Mobility Overal bed mobility: Needs Assistance Bed Mobility: Rolling, Sidelying to Sit Rolling: Min assist, Used rails Sidelying to sit: Min assist, +2 for safety/equipment, HOB elevated,  Used rails     Sit to sidelying: Min assist, Used rails General bed mobility comments: difficulty with sequencing for bed mobility with vebal and tactile cues    Transfers Overall transfer level: Needs assistance Equipment used: Rolling walker (2 wheels) Transfers: Sit to/from Stand, Bed to chair/wheelchair/BSC Sit to Stand: Min assist, Mod assist, From elevated surface, +2 physical assistance     Step pivot transfers: Mod assist, +2 physical assistance     General transfer comment: cues for hand placement with patient demonstrating difficulty following instructions. Patient demonstrating posterior leaning when standing and required 3 stands before performing transfer to recliner     Balance Overall balance assessment: Needs assistance Sitting-balance support: No upper extremity supported, Feet supported Sitting balance-Leahy Scale: Fair Sitting balance - Comments: supervision at EOB for safety   Standing balance support: Bilateral upper extremity supported, During functional activity Standing balance-Leahy Scale: Poor Standing balance comment: reliant on external support +2                           ADL either performed or assessed with clinical judgement   ADL Overall ADL's : Needs assistance/impaired     Grooming: Brushing hair;Sitting Grooming Details (indicate cue type and reason): combed right side but not left even following verbal and tactile cues             Lower Body Dressing: Maximal assistance;Sitting/lateral leans Lower Body Dressing Details (indicate cue type and reason): attempting to pull up socks but unable to reach               General ADL Comments: daugther and granddaughter present and assisted with cueing    Extremity/Trunk Assessment              Vision  Perception     Praxis     Communication Communication Communication: Impaired Factors Affecting Communication: Difficulty expressing self;Other (comment)  (low volume)   Cognition Arousal: Alert Behavior During Therapy: Flat affect Cognition: Cognition impaired   Orientation impairments: Situation, Time Awareness: Intellectual awareness intact, Online awareness impaired Memory impairment (select all impairments): Declarative long-term memory, Working memory, Short-term memory Attention impairment (select first level of impairment): Selective attention Executive functioning impairment (select all impairments): Problem solving, Sequencing OT - Cognition Comments: difficulty following directions                 Following commands: Impaired Following commands impaired: Follows one step commands inconsistently, Follows one step commands with increased time      Cueing   Cueing Techniques: Verbal cues, Gestural cues, Tactile cues, Visual cues  Exercises      Shoulder Instructions       General Comments See PT note on BP    Pertinent Vitals/ Pain       Pain Assessment Pain Assessment: Faces Faces Pain Scale: Hurts little more Pain Location: abdomen (LLQ and middle of belly) with mobility but initially pt stated no pain at rest. Pain Descriptors / Indicators: Discomfort, Guarding Pain Intervention(s): Limited activity within patient's tolerance, Monitored during session, Repositioned  Home Living                                          Prior Functioning/Environment              Frequency  Min 2X/week        Progress Toward Goals  OT Goals(current goals can now be found in the care plan section)  Progress towards OT goals: Progressing toward goals  Acute Rehab OT Goals Patient Stated Goal: none stated OT Goal Formulation: With patient Time For Goal Achievement: 09/10/23 Potential to Achieve Goals: Good ADL Goals Pt Will Perform Grooming: with supervision;with set-up;standing Pt Will Perform Upper Body Bathing: with set-up;with modified independence Pt Will Perform Lower Body Bathing:  with contact guard assist;with supervision Pt Will Perform Upper Body Dressing: with set-up;with modified independence Pt Will Perform Lower Body Dressing: with contact guard assist;with supervision Pt Will Transfer to Toilet: with contact guard assist;with supervision;ambulating Pt Will Perform Toileting - Clothing Manipulation and hygiene: with supervision;with modified independence Pt Will Perform Tub/Shower Transfer: with contact guard assist;with supervision;ambulating;rolling walker;shower seat;grab bars  Plan      Co-evaluation    PT/OT/SLP Co-Evaluation/Treatment: Yes Reason for Co-Treatment: For patient/therapist safety;To address functional/ADL transfers;Complexity of the patient's impairments (multi-system involvement) PT goals addressed during session: Mobility/safety with mobility;Proper use of DME;Balance OT goals addressed during session: ADL's and self-care      AM-PAC OT "6 Clicks" Daily Activity     Outcome Measure   Help from another person eating meals?: A Little Help from another person taking care of personal grooming?: A Little Help from another person toileting, which includes using toliet, bedpan, or urinal?: A Little Help from another person bathing (including washing, rinsing, drying)?: A Lot Help from another person to put on and taking off regular upper body clothing?: A Little Help from another person to put on and taking off regular lower body clothing?: A Lot 6 Click Score: 16    End of Session Equipment Utilized During Treatment: Rolling walker (2 wheels)  OT Visit Diagnosis: Unsteadiness on feet (R26.81);Other abnormalities of gait and mobility (  R26.89);Other symptoms and signs involving cognitive function   Activity Tolerance Patient tolerated treatment well   Patient Left in chair;with call bell/phone within reach;with chair alarm set;with family/visitor present   Nurse Communication Mobility status        Time: 1310-1343 OT Time  Calculation (min): 33 min  Charges: OT General Charges $OT Visit: 1 Visit OT Treatments $Self Care/Home Management : 8-22 mins  Anitra Barn, OTA Acute Rehabilitation Services  Office [phone]   Jovita Nipper 09/09/2023, 3:08 PM

## 2023-09-09 NOTE — Progress Notes (Signed)
 Pharmacy Electrolyte Replacement  Recent Labs:  Recent Labs    09/08/23 0506 09/08/23 1722 09/09/23 0500  K 4.1   < > 3.5  MG 2.5*  --  2.5*  PHOS 3.4  --   --   CREATININE 1.87*   < > 1.84*   < > = values in this interval not displayed.    Low Critical Values (K </= 2.5, Phos </= 1, Mg </= 1) Present: None  MD Contacted: n/a  Plan: 40 mEq KCL x1 PO   Patience Bonito, PharmD, BCPS, BCCCP Clinical Pharmacist

## 2023-09-09 NOTE — Progress Notes (Signed)
 Inpatient Rehab Admissions Coordinator:   Stopped by to see pt.  She was up in the chair, sleeping.  She does wake to voice.  Worked very well with therapy today, per RN tired after therapy session.  Son at bedside. Medical team continues to work towards stability and I will continue to follow.   Loye Rumble, PT, DPT Admissions Coordinator [phone] 09/09/23  4:00 PM

## 2023-09-09 NOTE — Progress Notes (Signed)
 Advanced Heart Failure Rounding Note  Cardiologist: Janelle Mediate, MD   Chief Complaint: HFpEF, Pulmonary Hypertension  Subjective:    Off IV pressor support. BPs remain stable in the 90s systolic on midodrine .   Volume status much improved as has her breathing/O2 requirements.  Scr 1.8 (relatively stable over the last wk, previous baseline ~1.2), K 3.5   Remains confused to time. Oriented to person and place.   Daughter and granddaughter present at bedside.    Objective:    Weight Range: 100.2 kg Body mass index is 38.22 kg/m.   Vital Signs:   Temp:  [97 F (36.1 C)-98.2 F (36.8 C)] 98.2 F (36.8 C) (05/05 1113) Pulse Rate:  [87-119] 115 (05/05 1100) Resp:  [15-27] 23 (05/05 1100) BP: (95-118)/(72-100) 104/82 (05/05 1100) SpO2:  [94 %-100 %] 98 % (05/05 1100) FiO2 (%):  [40 %] 40 % (05/04 2100) Weight:  [100.2 kg] 100.2 kg (05/05 0500) Last BM Date : 09/08/23  Weight change: Filed Weights   09/04/23 0500 09/05/23 0500 09/09/23 0500  Weight: 107.1 kg 106 kg 100.2 kg   Intake/Output:  Intake/Output Summary (Last 24 hours) at 09/09/2023 1227 Last data filed at 09/09/2023 1200 Gross per 24 hour  Intake --  Output 3450 ml  Net -3450 ml    Physical Exam   General:  alert, oriented x 2. No distress  ENT: + NGT  Neck: JVD 7-8 cm Cor: Irregularly irregular rhythm. No MRG Lungs: decreased BS at the bases b/l  Abdomen: obese, soft, nontender, nondistended.  Extremities: trace b/l LEE  Neuro: Awake/alert. Oriented to person and place. Not time   Telemetry   Afib 110s-120s, personally reviewed   Labs    CBC Recent Labs    09/08/23 0506 09/08/23 1735 09/09/23 0500  WBC 5.3  --  6.3  HGB 12.7 13.9 14.1  HCT 40.4 41.0 45.2  MCV 96.7  --  96.8  PLT 52*  --  51*   Basic Metabolic Panel Recent Labs    16/10/96 0506 09/08/23 1722 09/08/23 1735 09/09/23 0500  NA 141 140 141 145  K 4.1 3.9 3.9 3.5  CL 99 99  --  101  CO2 33* 31  --  33*   GLUCOSE 84 105*  --  116*  BUN 53* 50*  --  48*  CREATININE 1.87* 1.77*  --  1.84*  CALCIUM  9.3 9.2  --  9.5  MG 2.5*  --   --  2.5*  PHOS 3.4  --   --   --    BNP (last 3 results) Recent Labs    09/03/23 0320 09/04/23 0322 09/05/23 0419  BNP 2,106.9* 0,454.0* 1,540.4*   ProBNP (last 3 results) Recent Labs    03/13/23 1428  PROBNP 996.0*   Medications:    Scheduled Medications:  apixaban   5 mg Per Tube BID   arformoterol   15 mcg Nebulization BID   calcium  carbonate  200 mg of elemental calcium  Per Tube TID   Chlorhexidine  Gluconate Cloth  6 each Topical Q0600   ezetimibe   10 mg Per Tube QHS   feeding supplement  237 mL Per Tube BID BM   gabapentin   100 mg Per Tube TID   hydroxychloroquine   200 mg Per Tube Daily   lactulose   30 g Per Tube TID   lidocaine   1 patch Transdermal Q24H   midodrine   15 mg Per Tube TID WC   multivitamin with minerals  1 tablet Per Tube BID  pantoprazole   40 mg Oral Daily   polyethylene glycol  17 g Per Tube Daily   potassium chloride   40 mEq Per Tube Once   revefenacin   175 mcg Nebulization Daily   rosuvastatin   10 mg Per Tube Daily   senna  2 tablet Per Tube Daily   sodium chloride  flush  10-40 mL Intracatheter Q12H   torsemide  80 mg Oral Daily   Infusions:    PRN Medications: acetaminophen  **OR** acetaminophen , albuterol , alum & mag hydroxide-simeth, antiseptic oral rinse, bisacodyl , lip balm, melatonin, naLOXone  (NARCAN )  injection, ondansetron  (ZOFRAN ) IV, mouth rinse, sodium chloride  flush  Patient Profile   Whitney [name] is a 68-year-old female with a complex past medical history of Sjogren syndrome, cirrhosis, rheumatic heart disease with bioprosthetic AVR, mitral valve disease, pulmonary hypertension.    Assessment/Plan  HFpEF/ high output HF Pulmonary hypertension - RHC in 2024 most consistent with precapillary PH, started on tadalafil . Done after diuresis, and suspect this underestimates her group II component,  especially given mitral valve disease - Does have a large component of portopulmonary HTN, especially given high cardiac output - Echo this admission: EF >75%, RV moderately enlarged, RVSP 51 mmHg, severe LAE - Hypotension on admission, suspect liver disease is main driver. Initially required NE and VP. Now off IV pressor support. Continues on midodrine . SBPs stable in the 90s - continue midodrine  15 mg tid  - Volume status much improved. Transition to PO diuretics today, torsemide 80 mg daily. Follow urinary response closely. Discussed strict I/Os w/ RN . - GDMT limited by renal dysfunction and hypotension  - Poor SGLT2i candidate - Continue to hold tadalafil  for now - We discussed RHC w/ patient and family. Plan to follow response to torsemide for now. Will reassess need tomorrow    Acute on chronic hypoxic and hypercapnic respiratory failure - In setting of COPD, OSA and volume overload - BiPAP PRN and at HS - improved. Now on [place]. Alert.  AKI - Suspect mostly hepatorenal +/- component of cardiorenal - Improved with diuresis, SCr seems to have platueaued ~1.8  - continue to monitor - continue midodrine  for BP support   Valvular disease  - S/p bioprosthetic AVR, severe MAC - Gradients mildly high for AVR, but otherwise functioning well   Cirrhosis - due to NAFLD, w/ portal HTN - Follows at [facility] - Nadolol  on hold d/t hypotension   PAF:  - Continue eliquis . Monitor thrombocytopenia (50 K) - rates variable, 110s-120s on tele  - avoiding amio w/ liver dz - monitor   Thrombocytopenia -In setting of cirrhosis -Platelets have drifted from 80s>>48K this admit, historically 50s-60s -Watch closely with anticoagulation   Length of Stay: [address] PA-C  09/09/2023, 12:27 PM  Advanced Heart Failure Team Pager [phone] (M-F; 7a - 5p)  Please contact [facility] for night-coverage after hours (5p -7a ) and weekends on [email or web address]

## 2023-09-10 DIAGNOSIS — J449 Chronic obstructive pulmonary disease, unspecified: Secondary | ICD-10-CM | POA: Diagnosis not present

## 2023-09-10 DIAGNOSIS — J9622 Acute and chronic respiratory failure with hypercapnia: Secondary | ICD-10-CM | POA: Diagnosis not present

## 2023-09-10 DIAGNOSIS — K7581 Nonalcoholic steatohepatitis (NASH): Secondary | ICD-10-CM | POA: Diagnosis not present

## 2023-09-10 DIAGNOSIS — E119 Type 2 diabetes mellitus without complications: Secondary | ICD-10-CM | POA: Diagnosis not present

## 2023-09-10 LAB — PHOSPHORUS: Phosphorus: 3.7 mg/dL (ref 2.5–4.6)

## 2023-09-10 LAB — BASIC METABOLIC PANEL WITH GFR
Anion gap: 12 (ref 5–15)
BUN: 49 mg/dL — ABNORMAL HIGH (ref 8–23)
CO2: 34 mmol/L — ABNORMAL HIGH (ref 22–32)
Calcium: 10.4 mg/dL — ABNORMAL HIGH (ref 8.9–10.3)
Chloride: 104 mmol/L (ref 98–111)
Creatinine, Ser: 1.84 mg/dL — ABNORMAL HIGH (ref 0.44–1.00)
GFR, Estimated: 29 mL/min — ABNORMAL LOW (ref >60)
Glucose, Bld: 125 mg/dL — ABNORMAL HIGH (ref 70–99)
Potassium: 4.1 mmol/L (ref 3.5–5.1)
Sodium: 150 mmol/L — ABNORMAL HIGH (ref 135–145)

## 2023-09-10 LAB — CBC
HCT: 44.6 % (ref 36.0–46.0)
Hemoglobin: 13.9 g/dL (ref 12.0–15.0)
MCH: 30.3 pg (ref 26.0–34.0)
MCHC: 31.2 g/dL (ref 30.0–36.0)
MCV: 97.2 fL (ref 80.0–100.0)
Platelets: 51 10*3/uL — ABNORMAL LOW (ref 150–400)
RBC: 4.59 MIL/uL (ref 3.87–5.11)
RDW: 17.8 % — ABNORMAL HIGH (ref 11.5–15.5)
WBC: 7.4 10*3/uL (ref 4.0–10.5)
nRBC: 0 % (ref 0.0–0.2)

## 2023-09-10 LAB — AMMONIA: Ammonia: 71 umol/L — ABNORMAL HIGH (ref 9–35)

## 2023-09-10 LAB — GLUCOSE, CAPILLARY
Glucose-Capillary: 139 mg/dL — ABNORMAL HIGH (ref 70–99)
Glucose-Capillary: 147 mg/dL — ABNORMAL HIGH (ref 70–99)
Glucose-Capillary: 126 mg/dL — ABNORMAL HIGH (ref 70–99)
Glucose-Capillary: 142 mg/dL — ABNORMAL HIGH (ref 70–99)

## 2023-09-10 LAB — COOXEMETRY PANEL
Carboxyhemoglobin: 1.3 % (ref 0.5–1.5)
Methemoglobin: 1 % (ref 0.0–1.5)
O2 Saturation: 79.8 %
Total hemoglobin: 14.5 g/dL (ref 12.0–16.0)

## 2023-09-10 LAB — MAGNESIUM: Magnesium: 2.4 mg/dL (ref 1.7–2.4)

## 2023-09-10 MED ORDER — METOPROLOL TARTRATE 5 MG/5ML IV SOLN
2.5000 mg | Freq: Four times a day (QID) | INTRAVENOUS | Status: DC
  Administered 2023-09-10 – 2023-09-11 (×4): 2.5 mg via INTRAVENOUS
  Filled 2023-09-10 (×4): qty 5

## 2023-09-10 MED ORDER — MEGESTROL ACETATE 40 MG PO TABS
320.0000 mg | ORAL_TABLET | Freq: Every day | ORAL | Status: DC
  Administered 2023-09-10 – 2023-09-13 (×4): 320 mg via ORAL
  Filled 2023-09-10 (×4): qty 8

## 2023-09-10 MED ORDER — FREE WATER
200.0000 mL | Freq: Four times a day (QID) | Status: DC
  Administered 2023-09-10 – 2023-09-14 (×16): 200 mL

## 2023-09-10 NOTE — Progress Notes (Addendum)
 NAME:  Whitney [name], MRN:  [id number], DOB:  11-11-1955, LOS: 16 ADMISSION DATE:  08/25/2023, CONSULTATION DATE:  08/31/2023 REFERRING MD:  Sandria Cruise - TRH, CHIEF COMPLAINT: Hypercapnic respiratory failure  History of Present Illness:  68 year old woman with a past medical history significant for NASH cirrhosis, HTN, HLD, pulmonary hypertension, Sjogren's syndrome, thrombocytopenia in the setting of cirrhosis, type 2 diabetes and anemia who presented to the ED at [facility] 4/20 for complaints of altered mental status. Workup thus far has indicated that AMS is related to hypercapnic respiratory failure for whish PCCM has been called to assist in management .   Pertinent Medical History:  NASH cirrhosis, HTN, HLD, pulmonary hypertension, Sjogren's syndrome, thrombocytopenia in the setting of cirrhosis, type 2 diabetes and anemia  Significant Hospital Events: Including procedures, antibiotic start and stop dates in addition to other pertinent events   4/20 Admitted with altered mental status being this blood gas with PCO2 69.4 4/26 PCCM consulted ABG with pH 7.29 and PCO261, moved to the intensive care.  Placed on NIPPV had not received diuretics since admission 4/27 Did have a hypoglycemic event requiring dextrose  replacement BNP was 1322 echo cardiogram review shows hyperdynamic LV with grade 3 diastolic dysfunction and elevated LAP bioprosthesis aortic valve in place there was severe TR, severe mitral valve disease and severe PAH with a dilated IVC.,  Limited follow-up echocardiogram continues to show hyperdynamic EF greater than 75% RV moderately enlarged with moderately elevated pulmonary artery systolic pressures, left atrial size dilated estimated right atrial pressure 15 mmHg, 4/28 PICC placed, norepi and later vasopressin  started. Ad HF added empiric ceftriaxone  4/29 still w/ some hypercarbia, stopped dex. Cont lasix  gtt and vasopressor support. Volume status much improved. CXR improved.  Delirium an issue. PT ordered. OOB ordered. Resumed Neurontin . 5/1 Remains on NE, Vaso. weaning pressors. Started low dose spironolactone , considering TEE/DCCV prior to dc, seen by rehab team who felt BIPAP on intermittent basis would be barrier but if only needed at HS could be considered candidate  5/2 NE stopped. Changed goal to MAP 65 but advanced HF noting "Her PAH from portopulmonary hypertension may be contributing mildly, but the main driver of her pathology is her hepatic failure" noted that should she fail pressor wean palliative consult should be considered. Lasix  and spiro stopped 5/3 Stayed off norepi. MAPs >65 Still on vasopressin . Scr up a little. CVP 14-15.  Ammonia level was 80, started lactulose .  Got a dose of Lasix  5/4 CXR improved s/p diuretics, a little more awake.  Vasopressin  off  Interim History / Subjective:  Feeling ok overall this AM Multiple family members at bedside Discussed multiple conflicting medical issues causing difficulty with developing a long-term plan for Ms. Bee Currently Afib RVR with rates up to 130s-140s Metoprolol  (low dose) started for rate control No plan for RHC per AHF team Dispo CIR versus home, pending progress PMT continues to follow  Objective:   Blood pressure (!) 123/92, pulse (!) 108, temperature 97.9 F (36.6 C), temperature source Oral, resp. rate 19, height 5' 3.75" (1.619 m), weight 100.2 kg, SpO2 100%.        Intake/Output Summary (Last 24 hours) at 09/10/2023 5621 Last data filed at 09/10/2023 0630 Gross per 24 hour  Intake 291.35 ml  Output 3365 ml  Net -3073.65 ml   Filed Weights   09/04/23 0500 09/05/23 0500 09/09/23 0500  Weight: 107.1 kg 106 kg 100.2 kg   Physical Examination: General: Acute-on-chronically ill-appearing older woman in NAD. Pleasant and  soft spoken. HEENT: [place]/AT, anicteric sclera, PERRL, dry mucous membranes. NGT in place. Neuro: Awake, oriented x 3 (occasionally needs reorientation to situation).  Responds to verbal stimuli. Following commands consistently. Moves all 4 extremities spontaneously. Generalized weakness. CV: Irregularly irregular rhythm, rate 120s, no m/g/r. PULM: Breathing even and unlabored on RA. Lung fields diminished at bases bilaterally. GI: Soft, nontender, nondistended. Normoactive bowel sounds. Extremities: Bilateral mild 1+symmetric LE edema noted. Skin: Warm/dry, no rashes.  Ancillary Tests Personally Reviewed:     Assessment & Plan:   Acute-on-chronic hypoxic and hypercapnic respiratory failure with associated metabolic encephalopathy, secondary to volume overload, superimposed on underlying COPD and OSA. - Supplemental O2 support for sat > 90% (currently remains on 3L) - BiPAP QHS - Bronchodilators (Brovana /Yupelri , albuterol  PRN) - Pulmonary hygiene (IS/encourage OOB, mobility) - Diuresis per AHF, appreciate assistance - Continue torsemide, monitor I&Os  Acute-on-chronic HFpEF (grade III diastolic HF), high-output Portopulmonary HTN, holding tadalafil  S/p AVR (bioprosthetic) w/ on-going valvular disease (gradient high for AVR) - AHF following, appreciate recommendations - Goal MAP > 60 - Continue midodrine  TID - RHC considered, deferred in favor of medical therapy only in the setting of multiple comorbidities, risk evaluation and ultimately no significant change to plan of care - Ongoing GOC discussions, appreciate PMT involvement  Paroxysmal AF, now with RVR - Cardiac monitoring - Optimize electrolytes for K > 4, Mg > 2 - Eliquis  for [facility] - Low-dose metoprolol  added for rate control - Possible TEE at some point, however risk may outweigh benefit with known history of EV - No amio in the setting of cirrhosis/liver disease  Acute metabolic encephalopathy, feel this is more hepatic encephalopathy driven as well as degree of ICU delirium  Improved with lactulose .  - Continue low-dose gaba - Continue reduced dose lactulose  - BiPAP at bedtime for  hypercarbia - Ensure normal sleep/wake cycle - Limit additional sedating medications as able - Mobilize/OOB  AKI, likely in the setting of HF Serum creatinine had been improving, now slightly worse but stable. Query some degree of more likely hepatorenal physiology versus cardiorenal syndrome. - Trend BMP - Replete electrolytes as indicated - Monitor I&Os, diuresis per AHF - F/u urine studies - Avoid nephrotoxic agents as able - Ensure adequate renal perfusion (goal MAP > 60)  NASH cirrhosis Follow[facility] at [facility]. Last EGD 2021 with grade 2 EV. - Trend LFTs - Hold home nadolol  - Diuresis as tolerated - Trending ammonia, decreased - Continue lactulose , Xifaxin - Consider Zn supplementation  Hyperglycemia Had some mild hypoglycemia in the a.m. of 5/4 - CBGs ACHS - SSI discontinued in the setting of some lows, can add back PRN  Mild thrombocytopenia likely secondary to underlying cirrhosis currently stable, actually improving - Trend Plt - Monitor for signs of active bleeding - Transfuse for Plt < 20K, invasive procedure or hemodynamically significant bleeding  H/o Sjogren's syndrome - Continue Plaquenil   At risk for malnutrition, multifactorial in the setting of NASH cirrhosis, critical illness - RD/Nutrition consult appreciated - Maintain NGT for now, plan to exchange for Cortrak 5/7 - May benefit from supplemental TF - Calorie count  GOC - FULL CODE - Given progressive and compounding diagnoses of high-output HF, pHTN, NASH cirrhosis, discussion with patient/family re: long term outcomes warranted - Barrier to CIR at this point remains multifactorial (weakness, confusion, HR); if we can better control HR and mental status improves agree she would be a candidate; if this is not possible would discuss with patient/family re: her wishes and goals for QOL  and potentially work to get her home with services, potentially hospice if it is determined there's no good path forward for  Bee - Appreciate PMT assistance (following)  Best Practice (right click and "Reselect all SmartList Selections" daily)   Diet/type: Regular consistency (see orders) DVT prophylaxis: SCDs Start: 08/25/23 1931 apixaban  (ELIQUIS ) tablet 5 mg   Pressure ulcers present: N/A GI prophylaxis: N/A Lines: Central line Foley:  Yes, and it is still needed Code Status:  full code Last date of multidisciplinary goals of care discussion [5/2 - bedside discussion with PCCM/AHF, additional discussion 5/6AM with PCCM team]  Signature:   Star East, PA-C [facility] 09/10/23 8:12 AM  Please see [email or web address] for pager details.  From 7A-7P if no response, please call [phone] After hours, please call ELink [phone]

## 2023-09-10 NOTE — Progress Notes (Signed)
 Nutrition Brief Note  Patient discussed in IDT rounds.  Patient currently with NGT in place for consistent lactulose  administration.  Tentative plan for Cortrak placement for temporary nutrition support.  Plan to initiate calorie count to assess for nutrition adequacy.   Attempted to check in with patient at bed side however pt sleeping soundly and no family present at that time.   Spoke with RN who reports pt had only consumed about 95% of yogurt for breakfast and a couple bites of fruit. Lunch she ate about 25% of a grilled cheese sandwich and a can of sprite. She did not consume any protein supplements today.   Will follow up on full calorie count tomorrow.   Allie Kathrine Rieves, RDN, LDN Clinical Nutrition See AMiON for contact information.

## 2023-09-10 NOTE — Progress Notes (Addendum)
 Advanced Heart Failure Rounding Note  Cardiologist: Janelle Mediate, MD  AHF/PH Clinic: Dr. Mitzie Anda   Chief Complaint: HFpEF, Pulmonary Hypertension  Subjective:    Remains intermittently confused.   BP remains stable off IV pressors. On midodrine . Respiratory status overall improved. Now on 3L [place].   Volume status stable. SCr stable, plateaued ~1.8.   CO2 34. Did not sleep w/ Bipap last night  High stool output w/ lactulose  + rifaximin   NH3 down from 133>>71  Na 150   Objective:    Weight Range: 100.2 kg Body mass index is 38.22 kg/m.   Vital Signs:   Temp:  [97.2 F (36.2 C)-98.3 F (36.8 C)] 97.9 F (36.6 C) (05/06 0729) Pulse Rate:  [94-127] 108 (05/06 0600) Resp:  [16-23] 19 (05/06 0600) BP: (97-137)/(72-107) 123/92 (05/06 0600) SpO2:  [97 %-100 %] 100 % (05/06 0807) Last BM Date : 09/09/23  Weight change: Filed Weights   09/04/23 0500 09/05/23 0500 09/09/23 0500  Weight: 107.1 kg 106 kg 100.2 kg   Intake/Output:  Intake/Output Summary (Last 24 hours) at 09/10/2023 0905 Last data filed at 09/10/2023 0630 Gross per 24 hour  Intake 291.35 ml  Output 3365 ml  Net -3073.65 ml    Physical Exam   General:  alert, pleasantly confused. No respiratory difficulty  HEENT: normal + NGT  Neck: supple. JVD 8-9 cm. Carotids 2+ bilat; no bruits. No lymphadenopathy or thyromegaly appreciated. Cor: PMI nondisplaced. Irregularly irregular rhythm and rate  Lungs: clear Abdomen: soft, nontender, nondistended.  Extremities: no cyanosis, clubbing, rash, edema Neuro: A&x2 (person and place). Moves ext w/o difficulty  GU: + foley    Telemetry   Persistent Afib 110s-120s, personally reviewed   Labs    CBC Recent Labs    09/09/23 0500 09/10/23 0436  WBC 6.3 7.4  HGB 14.1 13.9  HCT 45.2 44.6  MCV 96.8 97.2  PLT 51* 51*   Basic Metabolic Panel Recent Labs    16/10/96 0506 09/08/23 1722 09/09/23 0500 09/10/23 0436  NA 141   < > 145 150*  K 4.1   < > 3.5  4.1  CL 99   < > 101 104  CO2 33*   < > 33* 34*  GLUCOSE 84   < > 116* 125*  BUN 53*   < > 48* 49*  CREATININE 1.87*   < > 1.84* 1.84*  CALCIUM  9.3   < > 9.5 10.4*  MG 2.5*  --  2.5* 2.4  PHOS 3.4  --   --  3.7   < > = values in this interval not displayed.   BNP (last 3 results) Recent Labs    09/03/23 0320 09/04/23 0322 09/05/23 0419  BNP 2,106.9* 0,454.0* 1,540.4*   ProBNP (last 3 results) Recent Labs    03/13/23 1428  PROBNP 996.0*   Medications:    Scheduled Medications:  apixaban   5 mg Oral BID   arformoterol   15 mcg Nebulization BID   calcium  carbonate  200 mg of elemental calcium  Oral TID   Chlorhexidine  Gluconate Cloth  6 each Topical Q0600   ezetimibe   10 mg Oral QHS   feeding supplement  237 mL Oral BID BM   gabapentin   100 mg Oral TID   hydroxychloroquine   200 mg Oral Daily   lactulose   30 g Oral TID   lidocaine   1 patch Transdermal Q24H   midodrine   15 mg Oral TID WC   multivitamin with minerals  1 tablet Oral  BID   pantoprazole   40 mg Oral Daily   polyethylene glycol  17 g Oral Daily   revefenacin   175 mcg Nebulization Daily   rifaximin  550 mg Oral BID   rosuvastatin   10 mg Oral Daily   senna  2 tablet Oral Daily   sodium chloride  flush  10-40 mL Intracatheter Q12H   torsemide  80 mg Oral Daily   Infusions:    PRN Medications: acetaminophen  **OR** acetaminophen , albuterol , alum & mag hydroxide-simeth, antiseptic oral rinse, bisacodyl , lip balm, melatonin, naLOXone  (NARCAN )  injection, ondansetron  (ZOFRAN ) IV, mouth rinse, sodium chloride  flush  Patient Profile   Whitney [name] is a 64-year-old female with a complex past medical history of Sjogren syndrome, cirrhosis, rheumatic heart disease with bioprosthetic AVR, mitral valve disease, pulmonary hypertension.    Assessment/Plan  HFpEF/ high output HF Pulmonary hypertension - RHC in 2024 most consistent with precapillary PH, started on tadalafil . Done after diuresis, and suspect this  underestimates her group II component, especially given mitral valve disease - Does have a large component of portopulmonary HTN, especially given high cardiac output - Echo this admission: EF >75%, RV moderately enlarged, RVSP 51 mmHg, severe LAE - Hypotension on admission, suspect liver disease is main driver. Initially required NE and VP. Now off IV pressor support. Continues on midodrine . SBPs stable in the 90s - continue midodrine  15 mg tid  - Volume status much improved. Continue PO torsemide 80 mg daily  - GDMT limited by renal dysfunction and hypotension  - Poor SGLT2i candidate - Continue to hold tadalafil  for now - We discussed RHC w/ patient and family. Due to intermittent confusion and the severity of her heart disease, liver disease, pulmonary hypertension and CKD we opted for medical therapy only at this time    Acute on chronic hypoxic and hypercapnic respiratory failure - In setting of COPD, OSA and volume overload - improved. Now on [place], 3L. Alert. - CO2 34, needs to use BiPAP at night   AKI - Suspect mostly hepatorenal +/- component of cardiorenal - Improved with diuresis, SCr seems to have platueaued ~1.8 and remains stable   - continue to monitor - continue midodrine  for BP support   Valvular disease  - S/p bioprosthetic AVR, severe MAC - Gradients mildly high for AVR, but otherwise functioning well   Cirrhosis - due to NAFLD, w/ portal HTN - Follows at [facility] - Nadolol  on hold d/t hypotension  - continue lactulose  + rifaximin  (NH3 133>>71)   PAF:  - Continue eliquis . Monitor thrombocytopenia (51 K) - rates variable, 110s-120s on tele but asymptomatic   - limited rate/rhythm control options  - avoiding amio w/ liver dz - monitor   Thrombocytopenia -In setting of cirrhosis -Platelets have drifted from 80s>>48K this admit, historically 50s-60s. Ok at 51K today. No gross bleeding  -Watch closely with anticoagulation  Hypernatremia - Na 150  - needs FW  boluses. CCM to manage    Length of Stay: [address] PA-C  09/10/2023, 9:05 AM  Advanced Heart Failure Team Pager [phone] (M-F; 7a - 5p)  Please contact [facility] for night-coverage after hours (5p -7a ) and weekends on [email or web address]

## 2023-09-10 NOTE — Progress Notes (Signed)
 Patient has NG tube in place. Respiratory status is normal on 3 lpm nasal cannula. No desats or apneic periods noted. BIPAP at bedside on standby. Patient resting comfortably.

## 2023-09-10 NOTE — TOC Progression Note (Signed)
 Transition of Care [facility]) - Progression Note    Patient Details  Name: Whitney [name] MRN: [id number] Date of Birth: 1955-12-11  Transition of Care [facility]) CM/SW Contact  Benjiman Bras, RN Phone Number: [phone] 09/10/2023, 5:10 PM   Clinical Narrative:    TOC CM received call back from [employer] rep, Mitch. Pt will need an overnight pulse oximetry on her current liter flow, which show she desats for at least 5 min to 88% or less to qualify for BIPAP ST or STA or NIV. Attending updated to see if RT can set her up tonight for reading. Pt will need a BIPAP for home.      Expected Discharge Plan: IP Rehab Facility Barriers to Discharge: Continued Medical Work up  Expected Discharge Plan and Services   Discharge Planning Services: CM Consult Post Acute Care Choice: Home Health Living arrangements for the past 2 months: Single Family Home                 DME Arranged: Walker rolling, NIV DME Agency: [employer] Date DME Agency Contacted: 09/06/23 Time DME Agency Contacted: [id number] Representative spoke with at DME Agency: Mitch HH Arranged: PT, OT, Nurse's Aide, RN HH Agency: [facility] Date [facility] Agency Contacted: 08/28/23   Representative spoke with at [facility] Agency: Randel Buss   Social Determinants of Health (SDOH) Interventions SDOH Screenings   Food Insecurity: No Food Insecurity (08/27/2023)  Housing: Low Risk  (09/03/2023)  Transportation Needs: No Transportation Needs (08/27/2023)  Utilities: Not At Risk (08/27/2023)  Depression (PHQ2-9): Low Risk  (09/20/2020)  Recent Concern: Depression (PHQ2-9) - Medium Risk (07/11/2020)  Social Connections: Unknown (08/27/2023)  Tobacco Use: Low Risk  (08/25/2023)    Readmission Risk Interventions     No data to display

## 2023-09-10 NOTE — Progress Notes (Signed)
 RN made aware of bipap being contraindicated at this time,as documented due to pt NG tube still being in place. Bipap mask is unable to seal properly for correct pressure with tubing and increases risk of skin tear/pressure wound. If NG is in use bipap increases aspiration risk. Referred RN to contact RT charge for tonight for further guidance/bipap policy after she stated "we've always done it". This RT is unaware of such practice. Device standby and ready for use at this time.

## 2023-09-11 ENCOUNTER — Other Ambulatory Visit (HOSPITAL_COMMUNITY): Payer: Self-pay | Admitting: Cardiovascular Disease

## 2023-09-11 ENCOUNTER — Other Ambulatory Visit: Payer: Self-pay | Admitting: Adult Health

## 2023-09-11 DIAGNOSIS — J9622 Acute and chronic respiratory failure with hypercapnia: Secondary | ICD-10-CM | POA: Diagnosis not present

## 2023-09-11 DIAGNOSIS — K7581 Nonalcoholic steatohepatitis (NASH): Secondary | ICD-10-CM | POA: Diagnosis not present

## 2023-09-11 DIAGNOSIS — M792 Neuralgia and neuritis, unspecified: Secondary | ICD-10-CM

## 2023-09-11 DIAGNOSIS — E119 Type 2 diabetes mellitus without complications: Secondary | ICD-10-CM | POA: Diagnosis not present

## 2023-09-11 DIAGNOSIS — J449 Chronic obstructive pulmonary disease, unspecified: Secondary | ICD-10-CM | POA: Diagnosis not present

## 2023-09-11 LAB — CBC WITH DIFFERENTIAL/PLATELET
Abs Immature Granulocytes: 0.02 10*3/uL (ref 0.00–0.07)
Basophils Absolute: 0 10*3/uL (ref 0.0–0.1)
Basophils Relative: 0 %
Eosinophils Absolute: 0.2 10*3/uL (ref 0.0–0.5)
Eosinophils Relative: 2 %
HCT: 43.1 % (ref 36.0–46.0)
Hemoglobin: 13.2 g/dL (ref 12.0–15.0)
Immature Granulocytes: 0 %
Lymphocytes Relative: 25 %
Lymphs Abs: 1.8 10*3/uL (ref 0.7–4.0)
MCH: 30.3 pg (ref 26.0–34.0)
MCHC: 30.6 g/dL (ref 30.0–36.0)
MCV: 99.1 fL (ref 80.0–100.0)
Monocytes Absolute: 1 10*3/uL (ref 0.1–1.0)
Monocytes Relative: 14 %
Neutro Abs: 4.3 10*3/uL (ref 1.7–7.7)
Neutrophils Relative %: 59 %
Platelets: 46 10*3/uL — ABNORMAL LOW (ref 150–400)
RBC: 4.35 MIL/uL (ref 3.87–5.11)
RDW: 17.9 % — ABNORMAL HIGH (ref 11.5–15.5)
WBC: 7.3 10*3/uL (ref 4.0–10.5)
nRBC: 0 % (ref 0.0–0.2)

## 2023-09-11 LAB — POCT I-STAT 7, (LYTES, BLD GAS, ICA,H+H)
Acid-Base Excess: 9 mmol/L — ABNORMAL HIGH (ref 0.0–2.0)
Bicarbonate: 35.9 mmol/L — ABNORMAL HIGH (ref 20.0–28.0)
Calcium, Ion: 1.23 mmol/L (ref 1.15–1.40)
HCT: 41 % (ref 36.0–46.0)
Hemoglobin: 13.9 g/dL (ref 12.0–15.0)
O2 Saturation: 98 %
Patient temperature: 97.1
Potassium: 3.6 mmol/L (ref 3.5–5.1)
Sodium: 147 mmol/L — ABNORMAL HIGH (ref 135–145)
TCO2: 38 mmol/L — ABNORMAL HIGH (ref 22–32)
pCO2 arterial: 53.3 mmHg — ABNORMAL HIGH (ref 32–48)
pH, Arterial: 7.433 (ref 7.35–7.45)
pO2, Arterial: 95 mmHg (ref 83–108)

## 2023-09-11 LAB — GLUCOSE, CAPILLARY
Glucose-Capillary: 105 mg/dL — ABNORMAL HIGH (ref 70–99)
Glucose-Capillary: 168 mg/dL — ABNORMAL HIGH (ref 70–99)
Glucose-Capillary: 176 mg/dL — ABNORMAL HIGH (ref 70–99)
Glucose-Capillary: 176 mg/dL — ABNORMAL HIGH (ref 70–99)

## 2023-09-11 LAB — BASIC METABOLIC PANEL WITH GFR
Anion gap: 5 (ref 5–15)
Anion gap: 9 (ref 5–15)
Anion gap: 11 (ref 5–15)
BUN: 46 mg/dL — ABNORMAL HIGH (ref 8–23)
BUN: 47 mg/dL — ABNORMAL HIGH (ref 8–23)
BUN: 51 mg/dL — ABNORMAL HIGH (ref 8–23)
CO2: 36 mmol/L — ABNORMAL HIGH (ref 22–32)
CO2: 36 mmol/L — ABNORMAL HIGH (ref 22–32)
CO2: 33 mmol/L — ABNORMAL HIGH (ref 22–32)
Calcium: 9.4 mg/dL (ref 8.9–10.3)
Calcium: 9.6 mg/dL (ref 8.9–10.3)
Calcium: 9.3 mg/dL (ref 8.9–10.3)
Chloride: 106 mmol/L (ref 98–111)
Chloride: 104 mmol/L (ref 98–111)
Chloride: 101 mmol/L (ref 98–111)
Creatinine, Ser: 1.83 mg/dL — ABNORMAL HIGH (ref 0.44–1.00)
Creatinine, Ser: 1.96 mg/dL — ABNORMAL HIGH (ref 0.44–1.00)
Creatinine, Ser: 1.87 mg/dL — ABNORMAL HIGH (ref 0.44–1.00)
GFR, Estimated: 29 mL/min — ABNORMAL LOW (ref >60)
GFR, Estimated: 27 mL/min — ABNORMAL LOW (ref >60)
GFR, Estimated: 29 mL/min — ABNORMAL LOW (ref >60)
Glucose, Bld: 109 mg/dL — ABNORMAL HIGH (ref 70–99)
Glucose, Bld: 108 mg/dL — ABNORMAL HIGH (ref 70–99)
Glucose, Bld: 210 mg/dL — ABNORMAL HIGH (ref 70–99)
Potassium: 6.7 mmol/L (ref 3.5–5.1)
Potassium: 3.5 mmol/L (ref 3.5–5.1)
Potassium: 3.8 mmol/L (ref 3.5–5.1)
Sodium: 147 mmol/L — ABNORMAL HIGH (ref 135–145)
Sodium: 149 mmol/L — ABNORMAL HIGH (ref 135–145)
Sodium: 145 mmol/L (ref 135–145)

## 2023-09-11 LAB — MAGNESIUM: Magnesium: 2 mg/dL (ref 1.7–2.4)

## 2023-09-11 LAB — AMMONIA: Ammonia: 38 umol/L — ABNORMAL HIGH (ref 9–35)

## 2023-09-11 MED ORDER — METOPROLOL SUCCINATE ER 25 MG PO TB24
25.0000 mg | ORAL_TABLET | Freq: Two times a day (BID) | ORAL | Status: DC
  Administered 2023-09-12 – 2023-09-13 (×3): 25 mg via ORAL
  Filled 2023-09-11 (×4): qty 1

## 2023-09-11 MED ORDER — ACETAZOLAMIDE SODIUM 500 MG IJ SOLR
500.0000 mg | Freq: Once | INTRAMUSCULAR | Status: AC
  Administered 2023-09-11: 500 mg via INTRAVENOUS
  Filled 2023-09-11 (×2): qty 500

## 2023-09-11 MED ORDER — FUROSEMIDE 10 MG/ML IJ SOLN
160.0000 mg | Freq: Two times a day (BID) | INTRAVENOUS | Status: DC
  Administered 2023-09-11: 160 mg via INTRAVENOUS
  Filled 2023-09-11 (×2): qty 16

## 2023-09-11 MED ORDER — POTASSIUM CHLORIDE 20 MEQ PO PACK
20.0000 meq | PACK | Freq: Once | ORAL | Status: AC
  Administered 2023-09-11: 20 meq
  Filled 2023-09-11: qty 1

## 2023-09-11 MED ORDER — SPIRONOLACTONE 25 MG PO TABS
25.0000 mg | ORAL_TABLET | Freq: Every day | ORAL | Status: DC
  Administered 2023-09-11 – 2023-09-13 (×3): 25 mg via ORAL
  Filled 2023-09-11 (×3): qty 1

## 2023-09-11 MED ORDER — FUROSEMIDE 10 MG/ML IJ SOLN
30.0000 mg/h | INTRAVENOUS | Status: DC
  Administered 2023-09-11 – 2023-09-12 (×3): 20 mg/h via INTRAVENOUS
  Administered 2023-09-12 – 2023-09-14 (×5): 30 mg/h via INTRAVENOUS
  Filled 2023-09-11 (×9): qty 20

## 2023-09-11 MED ORDER — PROSOURCE TF20 ENFIT COMPATIBL EN LIQD
60.0000 mL | Freq: Every day | ENTERAL | Status: DC
  Administered 2023-09-11 – 2023-09-14 (×4): 60 mL
  Filled 2023-09-11 (×4): qty 60

## 2023-09-11 MED ORDER — METOLAZONE 5 MG PO TABS
5.0000 mg | ORAL_TABLET | Freq: Once | ORAL | Status: AC
Start: 2023-09-11 — End: 2023-09-11
  Administered 2023-09-11: 5 mg via ORAL
  Filled 2023-09-11: qty 1

## 2023-09-11 MED ORDER — POTASSIUM CHLORIDE CRYS ER 20 MEQ PO TBCR
20.0000 meq | EXTENDED_RELEASE_TABLET | Freq: Once | ORAL | Status: DC
  Filled 2023-09-11: qty 1

## 2023-09-11 MED ORDER — METOPROLOL SUCCINATE ER 25 MG PO TB24
25.0000 mg | ORAL_TABLET | Freq: Once | ORAL | Status: AC
  Administered 2023-09-11: 25 mg via ORAL
  Filled 2023-09-11: qty 1

## 2023-09-11 MED ORDER — OSMOLITE 1.5 CAL PO LIQD
1000.0000 mL | ORAL | Status: DC
  Administered 2023-09-11 – 2023-09-13 (×4): 1000 mL

## 2023-09-11 NOTE — Progress Notes (Signed)
 Speech Language Pathology Treatment: Cognitive-Linquistic  Patient Details Name: Whitney [name] MRN: [id number] DOB: 1956-01-29 Today's Date: 09/11/2023 Time: 9562-1308 SLP Time Calculation (min) (ACUTE ONLY): 16 min  Assessment / Plan / Recommendation Clinical Impression  Pt continues to display significant difficulty with sustained attention and problem solving. She was provided a [employer] and asked to identify desired items and list their prices, which she did given Max multimodal cueing. Attending to the target was difficult, requiring repeated reminders regarding purpose of the task. Feel she will continue to benefit from intensive SLP f/u, >3 hrs/day. Will follow acutely.    HPI HPI: Whitney [name] is a 68 yo female presenting to ED 4/20 with AMS. MRI Brain shows small acute posterior R frontal white matter infarct. Seen by SLP 04/14/22 with WFL oropharyngeal swallow and cognition. PMH includes Sjogren's syndrome, T2DM, fibromyalgia, depression, GERD, history of aortic valve replacement, A-fib on Eliquis , TIA, NASH cirrhosis, pulmonary HTN, 4L [place] at baseline      SLP Plan  Continue with current plan of care      Recommendations for follow up therapy are one component of a multi-disciplinary discharge planning process, led by the attending physician.  Recommendations may be updated based on patient status, additional functional criteria and insurance authorization.    Recommendations                         Frequent or constant Supervision/Assistance Frontal lobe and executive function deficit;Cognitive communication deficit (M57.846)   Cerebral infarction Continue with current plan of care     Amil Kale, M.A., CCC-SLP Speech Language Pathology, Acute Rehabilitation Services  Secure Chat preferred [phone]   09/11/2023, 4:01 PM

## 2023-09-11 NOTE — Progress Notes (Signed)
 NAME:  Whitney [name], MRN:  [id number], DOB:  [birth year], LOS: 17 ADMISSION DATE:  08/25/2023, CONSULTATION DATE:  08/31/2023 REFERRING MD:  Sandria Cruise - TRH, CHIEF COMPLAINT: Hypercapnic respiratory failure  History of Present Illness:  68 year old woman with a past medical history significant for NASH cirrhosis, HTN, HLD, pulmonary hypertension, Sjogren's syndrome, thrombocytopenia in the setting of cirrhosis, type 2 diabetes and anemia who presented to the ED at [facility] 4/20 for complaints of altered mental status. Workup thus far has indicated that AMS is related to hypercapnic respiratory failure for whish PCCM has been called to assist in management.   Pertinent Medical History:  NASH cirrhosis, HTN, HLD, pulmonary hypertension, Sjogren's syndrome, thrombocytopenia in the setting of cirrhosis, type 2 diabetes and anemia  Significant Hospital Events: Including procedures, antibiotic start and stop dates in addition to other pertinent events   4/20 Admitted with altered mental status being this blood gas with PCO2 69.4 4/26 PCCM consulted ABG with pH 7.29 and PCO261, moved to the intensive care.  Placed on NIPPV had not received diuretics since admission 4/27 Did have a hypoglycemic event requiring dextrose  replacement BNP was 1322 echo cardiogram review shows hyperdynamic LV with grade 3 diastolic dysfunction and elevated LAP bioprosthesis aortic valve in place there was severe TR, severe mitral valve disease and severe PAH with a dilated IVC.,  Limited follow-up echocardiogram continues to show hyperdynamic EF greater than 75% RV moderately enlarged with moderately elevated pulmonary artery systolic pressures, left atrial size dilated estimated right atrial pressure 15 mmHg, 4/28 PICC placed, norepi and later vasopressin  started. Ad HF added empiric ceftriaxone  4/29 still w/ some hypercarbia, stopped dex. Cont lasix  gtt and vasopressor support. Volume status much improved. CXR improved. Delirium  an issue. PT ordered. OOB ordered. Resumed Neurontin . 5/1 Remains on NE, Vaso. weaning pressors. Started low dose spironolactone , considering TEE/DCCV prior to dc, seen by rehab team who felt BIPAP on intermittent basis would be barrier but if only needed at HS could be considered candidate  5/2 NE stopped. Changed goal to MAP 65 but advanced HF noting "Her PAH from portopulmonary hypertension may be contributing mildly, but the main driver of her pathology is her hepatic failure" noted that should she fail pressor wean palliative consult should be considered. Lasix  and spiro stopped 5/3 Stayed off norepi. MAPs >65 Still on vasopressin . Scr up a little. CVP 14-15.  Ammonia level was 80, started lactulose .  Got a dose of Lasix . 5/4 CXR improved s/p diuretics, a little more awake.  Vasopressin  off 5/6 Levophed  off. Afib RVR later in the day with rates up to 130s.  Interim History / Subjective:  No significant events overnight Pleasant and talkative this AM, intermittently confused but able to be reoriented Was not placed on BiPAP overnight, as RT refused due to NGT status Remains in Afib with suboptimal rate control despite low-dose BB Not a good candidate for cardioversion at this time Initial K 6.7 today, false value, repeat 3.5 Cortrak placement today to facilitate better nutrition Working with PT/OT  Objective:   Blood pressure 103/76, pulse (!) 113, temperature (!) 97.1 F (36.2 C), temperature source Axillary, resp. rate (!) 21, height 5' 3.75" (1.619 m), weight 100.2 kg, SpO2 100%. CVP:  [7 mmHg-24 mmHg] 18 mmHg      Intake/Output Summary (Last 24 hours) at 09/11/2023 0945 Last data filed at 09/11/2023 0900 Gross per 24 hour  Intake 1300 ml  Output 1735 ml  Net -435 ml   [employer]  09/04/23 0500 09/05/23 0500 09/09/23 0500  Weight: 107.1 kg 106 kg 100.2 kg   Physical Examination: General: Chronically ill-appearing older woman in NAD. Pleasant and conversant. HEENT: [place]/AT,  anicteric sclera, PERRL, moist mucous membranes. Neuro: Awake, oriented x 3-4. Responds to verbal stimuli. Following commands consistently. Moves all 4 extremities spontaneously. Generalized weakness. CV: Irregularly irregular rhythm, rate 110s, no m/g/r. PULM: Breathing even and unlabored on 3LNC. Lung fields diminished at bilateral bases. GI: Soft, nontender, nondistended. Normoactive bowel sounds. Extremities: Trace BLE edema noted. Skin: Warm/dry, no rashes.  Ancillary Tests Personally Reviewed:     Assessment & Plan:   Acute-on-chronic hypoxic and hypercapnic respiratory failure with associated metabolic encephalopathy, secondary to volume overload, superimposed on underlying COPD and OSA. - Supplemental O2 support for sat > 90% (remains on 3LNC) - BiPAP QHS - Bronchodilators (Brovana /Yupelri , albuterol  PRN) - Wean FiO2 for O2 sat > 90% - Pulmonary hygiene (IS, encourage OOB/mobility) - Diuresis per AHF team, Lasix /metolazone  today 5/7  Acute-on-chronic HFpEF (grade III diastolic HF), high-output Portopulmonary HTN, holding tadalafil  S/p AVR (bioprosthetic) w/ on-going valvular disease (gradient high for AVR) - AHF following, appreciate recommendations - Goal MAP > 60 - Continue midodrine  TID - RHC considered, deferred in favor of medical therapy in the setting of multiple comorbidities/risk vs. Benefit and ultimately no significant change to plan of care - Ongoing GOC discussions, appreciate PMT involvement  Paroxysmal AF, now with RVR Persistent Afib with RVR despite addition of BB. Avoiding amiodarone  in the setting of liver disease. - Optimize electrolyes (K > 4, Mg > 2) - Cardiac monitoring - Eliquis  for AC - Remains in RVR despite low-dose BB initiation - May eventually be a candidate for cardioversion; at this time, with multiple comorbidities/critical illness, risk > benefit  Acute metabolic encephalopathy, feel this is more hepatic encephalopathy driven as well as  degree of ICU delirium  Improved with lactulose .  - Continue low-dose gabapentin  - Continue lactulose , Xifaxin - BiPAP at night for hypercarbia - Correct metabolic derangements - Ensure normal sleep/wake cycle - Mobilize/OOB  AKI, likely in the setting of HF Serum creatinine had been improving, now slightly worse but stable. Query some degree of more likely hepatorenal physiology versus cardiorenal syndrome. - Trend BMP - Replete electrolytes as indicated - Monitor I&Os, diuresis per AHF - Avoid nephrotoxic agents as able - Ensure adequate renal perfusion (goal MAP > 60)  NASH cirrhosis Follows at [facility]. Last EGD 2021 with grade 2 EV. - Trend LFTs - Diuresis as tolerated - Holding home nadolol  - Trending ammonia (downtrending, 38 from 71) - Continue lactulose /Xifaxin - Consider Zn supplementation  Hyperglycemia Had some mild hypoglycemia in the a.m. of 5/4 - CBGs Q4H - Goal CBG 140-180 - SSI held for episodes of hypoglycemia, add back as indicated  Thrombocytopenia likely secondary to underlying cirrhosis currently stable, actually improving - Trend Plt - Monitor for signs of active bleeding - Transfuse for Plt < 20K, invasive procedures or hemodynamically significant bleeding  H/o Sjogren's syndrome - Continue Plaquenil   At risk for malnutrition, multifactorial in the setting of NASH cirrhosis, critical illness - RD/Nutrition consulted, appreciate recs - Cortrak placement 5/7 - Begin supplemental TF - Calorie count  GOC - FULL CODE - Given progressive and compounding diagnoses of high-output HF, pHTN, NASH cirrhosis, discussion with patient/family re: long term outcomes warranted - Barrier to CIR at this point remains multifactorial (weakness, confusion, HR); if we can better control HR and mental status improves agree she would be a candidate; if  this is not possible would discuss with patient/family re: her wishes and goals for QOL and potentially work to get her  home with services, potentially hospice if it is determined there's no good path forward for Bee - Appreciate PMT assistance (following)  Best Practice (right click and "Reselect all SmartList Selections" daily)   Diet/type: Regular consistency (see orders) DVT prophylaxis: SCDs Start: 08/25/23 1931 apixaban  (ELIQUIS ) tablet 5 mg   Pressure ulcers present: N/A GI prophylaxis: N/A Lines: Central line Foley:  Yes, and it is still needed Code Status:  full code Last date of multidisciplinary goals of care discussion [5/6 - Bedside discussion with PCCM]  Signature:   Star East, PA-C [facility] 09/11/23 9:45 AM  Please see [email or web address] for pager details.  From 7A-7P if no response, please call [phone] After hours, please call ELink [phone]

## 2023-09-11 NOTE — Progress Notes (Signed)
 eLink Physician-Brief Progress Note Patient Name: CIARRA WELBURN DOB: [birth year] MRN: [id number]   Date of Service  09/11/2023  HPI/Events of Note  RR 18, saturation 98 %, no obvious work of breathing issues.  eICU Interventions  BIPAP held pending patient gets planned Cortrak tube in am.        Hearl Heikes U Ezrie Bunyan 09/11/2023, 1:16 AM

## 2023-09-11 NOTE — Plan of Care (Signed)
  Problem: Education: Goal: Ability to describe self-care measures that may prevent or decrease complications (Diabetes Survival Skills Education) will improve Outcome: Progressing Goal: Individualized Educational Video(s) Outcome: Progressing   Problem: Health Behavior/Discharge Planning: Goal: Ability to identify and utilize available resources and services will improve Outcome: Progressing Goal: Ability to manage health-related needs will improve Outcome: Progressing   Problem: Metabolic: Goal: Ability to maintain appropriate glucose levels will improve Outcome: Progressing   Problem: Nutritional: Goal: Maintenance of adequate nutrition will improve Outcome: Progressing Goal: Progress toward achieving an optimal weight will improve Outcome: Progressing   Problem: Skin Integrity: Goal: Risk for impaired skin integrity will decrease Outcome: Progressing   Problem: Tissue Perfusion: Goal: Adequacy of tissue perfusion will improve Outcome: Progressing   Problem: Education: Goal: Knowledge of disease or condition will improve Outcome: Progressing Goal: Knowledge of secondary prevention will improve (MUST DOCUMENT ALL) Outcome: Progressing Goal: Knowledge of patient specific risk factors will improve (DELETE if not current risk factor) Outcome: Progressing   Problem: Ischemic Stroke/TIA Tissue Perfusion: Goal: Complications of ischemic stroke/TIA will be minimized Outcome: Progressing   Problem: Health Behavior/Discharge Planning: Goal: Ability to manage health-related needs will improve Outcome: Progressing Goal: Goals will be collaboratively established with patient/family Outcome: Progressing   Problem: Self-Care: Goal: Ability to participate in self-care as condition permits will improve Outcome: Progressing Goal: Verbalization of feelings and concerns over difficulty with self-care will improve Outcome: Progressing Goal: Ability to communicate needs accurately  will improve Outcome: Progressing   Problem: Nutrition: Goal: Risk of aspiration will decrease Outcome: Progressing Goal: Dietary intake will improve Outcome: Progressing   Problem: Education: Goal: Knowledge of General Education information will improve Description: Including pain rating scale, medication(s)/side effects and non-pharmacologic comfort measures Outcome: Progressing   Problem: Health Behavior/Discharge Planning: Goal: Ability to manage health-related needs will improve Outcome: Progressing   Problem: Clinical Measurements: Goal: Ability to maintain clinical measurements within normal limits will improve Outcome: Progressing Goal: Will remain free from infection Outcome: Progressing Goal: Diagnostic test results will improve Outcome: Progressing Goal: Respiratory complications will improve Outcome: Progressing Goal: Cardiovascular complication will be avoided Outcome: Progressing   Problem: Activity: Goal: Risk for activity intolerance will decrease Outcome: Progressing   Problem: Elimination: Goal: Will not experience complications related to bowel motility Outcome: Progressing Goal: Will not experience complications related to urinary retention Outcome: Progressing   Problem: Pain Managment: Goal: General experience of comfort will improve and/or be controlled Outcome: Progressing   Problem: Safety: Goal: Ability to remain free from injury will improve Outcome: Progressing   Problem: Skin Integrity: Goal: Risk for impaired skin integrity will decrease Outcome: Progressing

## 2023-09-11 NOTE — Telephone Encounter (Signed)
 This is a CHF pt

## 2023-09-11 NOTE — Progress Notes (Signed)
 Patient ID: Whitney [name], female   DOB: Oct 04, 1955, 68 y.o.   MRN: [id number]    Progress Note from the Palliative Medicine Team at [facility]   Patient Name: Whitney [name]        Date: 09/11/2023 DOB: [birth year]  Age: 68 y.o. MRN#: [id number] Attending Physician: Arlyne Bering, MD Primary Care Physician: Jimmey Mould, MD Admit Date: 08/25/2023   Reason for Consultation/Follow-up   Establishing Goals of Care   HPI/ Brief Hospital Review  68 y.o. female  with past medical history of NASH cirrhosis, pulmonary hypertension, sjogren's syndrome, DMII, paroxysmal a-fib on Eliquis , TIA, and anemia admitted on 08/25/2023 with altered mental status, acute on chronic respiratory failure and AKI.   Today is day 16 of this hospitalization.  Patient is off pressors, ammonia is trending down,  she is progressing with therapies, to review for CIR  Patient and family face treatment option decisions, advanced directive decisions and anticipatory care needs.   Subjective  Extensive chart review has been completed prior to meeting with patient/family  including labs, vital signs, imaging, progress/consult notes, orders, medications and available advance directive documents.    This NP assessed patient at the bedside as a follow up for palliative medicine needs and emotional support.   Patient is out of bed to the chair, she is alert and oriented today..  Son/Lucas at bedside  Ongoing education regarding current medical situation specific to multiple comorbidities.  I was able to speak directly to patient today and her clarity regarding her goals of care.  She understands the seriousness of her medical situation but remains open to all offered and available medical interventions to prolong life.  She is hopeful for improvement.      We discussed the importance of continued conversation with her family and medical providers regarding overall plan of care now and into the future ensuring that  medical decisions are within the context of the patient's values and goals of care.      We discussed advance care planning documents and H POA.  Spiritual care will assist patient's secure advance directives while here in the hospital.  Ms. Bonnie Butters is interested in securing H POA and she clearly verbalizes today her desire that her son Lauri Poot be her H POA speaking for her in the event that she does not have medical decision-making capacity.  I will place order for spiritual care to assist patient with these documents.     Her son verbalizes frustration,  not being updated from the medical team.  He feels that other family numbers are present during rounds and he is not getting pertinent information regarding his mothers treatment plan and progress.    I will relay the message to treatment team.,  I spoke directly with Shelli Dexter PA-C regarding this information.      Recommendation for coordination of a family/treatment team meeting to include all specialists and all family members hoping to reduce risk  of miscommunication.  Questions and concerns addressed   Discussed with primary team and nursing staff  PMT will continue to support holistically  This nurse practitioner informed  the patient and the attending that I will be out of the hospital until Monday morning.  My colleague Joaquim Muir NP wil be following this week-end Call palliative medicine team phone # [phone] with questions or concerns.   Time: 50   minutes  Detailed review of medical records ( labs, imaging, vital signs), medically appropriate exam ( MS,  skin, cardiac,  resp)   discussed with treatment team, counseling and education to patient, family, staff, documenting clinical information, medication management, coordination of care    Thena Fireman NP  Palliative Medicine Team Team Phone # [phone] Pager [phone]

## 2023-09-11 NOTE — Progress Notes (Signed)
 Inpatient Rehab Admissions Coordinator:   Met with pt and her 2 granddaughters at bedside.  Pt alert and bright eyed.  Planning to switch NG to cortrak this morning.  AF with RVR yesterday, this AM rates in the 120s to 130s at rest.  We will continue to follow for medical stability.   Loye Rumble, PT, DPT Admissions Coordinator [phone] 09/11/23  10:49 AM

## 2023-09-11 NOTE — Progress Notes (Signed)
 PT Cancellation Note  Patient Details Name: LAURETTE PASSARELLA MRN: [id number] DOB: 12-17-1955   Cancelled Treatment:    Reason Eval/Treat Not Completed: Patient at procedure or test/unavailable (Cortrak getting placed.  Will check back as able.)   Florencia Hunter 09/11/2023, 10:45 AM Anisha Starliper M,PT Acute Rehab Services [phone]

## 2023-09-11 NOTE — Progress Notes (Signed)
 Physical Therapy Treatment Patient Details Name: Whitney [name] MRN: [id number] DOB: 03/26/1956 Today's Date: 09/11/2023   History of Present Illness 68 yo female admitted on 08/25/23 with acute encephalopathy. Transferred to ICU 4/26 for worsening ABG/ increased lethargy.  PMH: NASH cirrhosis, DM2, PAF on eliquis , sjogren's syndrome, severe AS s/p AVR, OSA on CPAP.    PT Comments  Pt admitted with above diagnosis. Pt was able to stand to [place] and used STedy to transfer pt from bed to recliner with total assist sitting in [place].  Pt requires mod assist of 2 for bed mobility and sit to stand. Pt is progressing although slowly and is limited by decr endurance, pain and incr HR with activity. Will continue to follow pt.  Pt currently with functional limitations due to the deficits listed below (see PT Problem List). Pt will benefit from acute skilled PT to increase their independence and safety with mobility to allow discharge.       If plan is discharge home, recommend the following: A little help with bathing/dressing/bathroom;Assistance with cooking/housework;Direct supervision/assist for medications management;Assist for transportation;Supervision due to cognitive status;Help with stairs or ramp for entrance;A lot of help with walking and/or transfers   Can travel by private vehicle        Equipment Recommendations  Rolling walker (2 wheels)    Recommendations for Other Services       Precautions / Restrictions Precautions Precautions: Fall Recall of Precautions/Restrictions: Impaired Precaution/Restrictions Comments: monitor O2, BP, b/b incontinence; has cor trak and foley Restrictions Weight Bearing Restrictions Per Provider Order: No     Mobility  Bed Mobility Overal bed mobility: Needs Assistance Bed Mobility: Rolling, Sidelying to Sit Rolling: Min assist, Used rails Sidelying to sit: Min assist, +2 for safety/equipment, HOB elevated, Used rails       General bed mobility  comments: difficulty with sequencing for bed mobility with verbal and tactile cues    Transfers Overall transfer level: Needs assistance   Transfers: Sit to/from Stand, Bed to chair/wheelchair/BSC Sit to Stand: Mod assist, From elevated surface, +2 physical assistance           General transfer comment: cues for hand placement with patient demonstrating difficulty following instructions. Patient demonstrating posterior leaning when standing. Pt able to stand to [facility] with mod cues and mod assist to power up. Pt stood and then sat on pads. Moved pt to the recliner and she stood again to get in recliner. HR to 130's therefore did not push pt too much. BP stable. Pt fatigues quickly. Transfer via Lift Equipment: Stedy  Ambulation/Gait                   Stairs             Wheelchair Mobility     Tilt Bed    Modified Rankin (Stroke Patients Only)       Balance Overall balance assessment: Needs assistance Sitting-balance support: Feet supported, Bilateral upper extremity supported Sitting balance-Leahy Scale: Poor Sitting balance - Comments: CGA with UE support at EOB for safety   Standing balance support: Bilateral upper extremity supported, During functional activity Standing balance-Leahy Scale: Poor Standing balance comment: reliant on external support +2 and Stedy                            Communication Communication Communication: Impaired Factors Affecting Communication: Difficulty expressing self;Other (comment) (low volume)  Cognition Arousal: Alert Behavior During Therapy: Flat affect  PT - Cognitive impairments: Orientation, Memory, Attention, Initiation, Sequencing, Problem solving, Safety/Judgement   Orientation impairments: Time, Situation                   PT - Cognition Comments: Difficulty sequencing and problem solving, pt instructed via visual/verbal demo on safe UE placement prior to standing but with poor carryover  of technique. Following commands: Impaired Following commands impaired: Follows one step commands inconsistently, Follows one step commands with increased time    Cueing Cueing Techniques: Verbal cues, Gestural cues, Tactile cues, Visual cues  Exercises General Exercises - Upper Extremity Shoulder Flexion: AROM, Both, 5 reps, Supine Elbow Flexion: AROM, Both, 5 reps, Supine General Exercises - Lower Extremity Ankle Circles/Pumps: AROM, Both, 20 reps, Seated Long Arc Quad: AROM, Both, 10 reps, Seated Hip Flexion/Marching: AROM, Both, 10 reps, Seated    General Comments        Pertinent Vitals/Pain Pain Assessment Pain Assessment: Faces Faces Pain Scale: Hurts little more Pain Location: abdomen (LLQ and middle of belly) with mobility but initially pt stated no pain at rest. Pain Descriptors / Indicators: Discomfort, Guarding Pain Intervention(s): Limited activity within patient's tolerance, Monitored during session, Repositioned    Home Living                          Prior Function            PT Goals (current goals can now be found in the care plan section) Acute Rehab PT Goals Patient Stated Goal: Per family to get stronger so she can go home with caregiver support. Progress towards PT goals: Progressing toward goals    Frequency    Min 3X/week      PT Plan      Co-evaluation              AM-PAC PT "6 Clicks" Mobility   Outcome Measure  Help needed turning from your back to your side while in a flat bed without using bedrails?: A Little Help needed moving from lying on your back to sitting on the side of a flat bed without using bedrails?: A Lot Help needed moving to and from a bed to a chair (including a wheelchair)?: A Lot Help needed standing up from a chair using your arms (e.g., wheelchair or bedside chair)?: A Lot Help needed to walk in hospital room?: Total (<62ft today) Help needed climbing 3-5 steps with a railing? : Total 6 Click  Score: 11    End of Session Equipment Utilized During Treatment: Oxygen ;Gait belt (on 3LO2 at home) Activity Tolerance: Patient tolerated treatment well;Patient limited by fatigue Patient left: with call bell/phone within reach;in chair;with family/visitor present;with chair alarm set (x2 family members present in her room) Nurse Communication: Mobility status;Other (comment);Need for lift equipment (safe technique for return transfer pt likely to do better with +2 HHA vs. [place] Belts) PT Visit Diagnosis: Other abnormalities of gait and mobility (R26.89)     Time: 1610-9604 PT Time Calculation (min) (ACUTE ONLY): 32 min  Charges:    $Gait Training: 8-22 mins $Therapeutic Exercise: 8-22 mins PT General Charges $$ ACUTE PT VISIT: 1 Visit                     Geanie Pacifico M,PT Acute Rehab Services [phone]    Florencia Hunter 09/11/2023, 2:02 PM

## 2023-09-11 NOTE — Procedures (Signed)
 Cortrak  Person Inserting Tube:  Whitney [name], Whitney [name], RD Tube Type:  Cortrak - 43 inches Tube Size:  10 Tube Location:  Left nare Initial Placement:  Stomach Secured by: Bridle Technique Used to Measure Tube Placement:  Marking at nare/corner of mouth Cortrak Secured At:  68 cm   Cortrak Tube Team Note:  Consult received to place a Cortrak feeding tube.   No x-ray is required. RN may begin using tube.   If the tube becomes dislodged please keep the tube and contact the Cortrak team at [email or web address] for replacement.  If after hours and replacement cannot be delayed, place a NG tube and confirm placement with an abdominal x-ray.    Whitney [name], RD Registered Dietitian  See Amion for more information

## 2023-09-11 NOTE — Progress Notes (Signed)
 eLink Physician-Brief Progress Note Patient Name: Whitney [name] DOB: [birth year] MRN: [id number]   Date of Service  09/11/2023  HPI/Events of Note  K+ 6.7 on latest BMP which is significantly different from prior result of 4.1.  eICU Interventions  Stat BMP ordered to verify result.        Jivan Symanski U Felise [place] 09/11/2023, 6:53 AM

## 2023-09-11 NOTE — Progress Notes (Signed)
 Nutrition Follow-up  DOCUMENTATION CODES:   Obesity unspecified  INTERVENTION:  Discontinue calorie count Continue regular diet as ordered [place] yogurt with all meals; family to bring food when visiting Ensure Enlive po BID, each supplement provides 350 kcal and 20 grams of protein. Magic cup TID with meals, each supplement provides 290 kcal and 9 grams of protein Bariatric MVI regimen BID; hold TUMS TID d/t calcium  WDL Once Cortrak placed, initiate TF: Osmolite 1.5 at 45ml/hr (1080ml per day) *start at 15ml/hr and advance by 10ml q6h to goal rate 60ml ProSource TF20 once daily Provides 1700 kcal, 88g protein, free water  daily  NUTRITION DIAGNOSIS:  Inadequate oral intake related to acute illness, poor appetite as evidenced by per patient/family report. - remains applicable  GOAL:  Patient will meet greater than or equal to 90% of their needs - unmet  MONITOR:  PO intake, Supplement acceptance, Diet advancement, Labs, Weight trends, I & O's  REASON FOR ASSESSMENT:  Rounds    ASSESSMENT:  Pt admitted with c/o AMS r/t hypercapnic respiratory failure. PMH significant for NASH cirrhosis, HTN, HLD, pulmonary HTN, Sjogren's syndrome, thrombocytopenia, DM2, anemia.  Pt remains inpatient for medical management of multiple medical issues. Per AHF, remains significantly volume overloaded. No plans for RHC at this time. Dispo CIR versus home pending progress.   5/6 calorie count results- B: 81g protein and 10g protein L: 133 kcal and 3g protein D: 112 kcal and 12g protein Total kcal consumed- 326 kcal (20% of minimum estimated needs) Total protein consumed- 25g protein (33% of minimum estimated needs)  Calorie count does not include any nutrition supplements or home food that may have been provided.   Spoke with pt and her granddaughter present at bedside. Pt working on consuming breakfast at time of visit but had only taken bites of a muffin and oatmeal. Family bringing  food from home when visiting. Reported receiving [place] for breakfast one day. Per RN yesterday, pt refused Ensure at that time.   Cortrak placed today. Plans to initiate supplemental nutrition support.  Noted pt started on megace yesterday.    Admit weight: 105.2 kg Current weight: 100.2 kg +mild pitting generalized edema  Drains/lines: UOP: x24 hours Rectal tube  Medications: lactulose  TID, megace daily, MVI BID, protonix  daily, miralax  daily (not given today), senna daily (not given today), torsemide Drips: Lasix  160mg  BID IV  Labs:  Sodium 149 BUN 47 Cr 1.96 Ammonia 38 GFR 27 CBG's 105-147 x24 hours  Diet Order:   Diet Order             Diet regular Room service appropriate? Yes; Fluid consistency: Thin  Diet effective now                   EDUCATION NEEDS:   Education needs have been addressed  Skin:  Skin Assessment: Skin Integrity Issues: Skin Integrity Issues:: Stage I Stage I: nose  Last BM:  x24 hours via rectal tube  Height: Ht Readings from Last 1 Encounters:  08/27/23 5' 3.75" (1.619 m)    Weight:  Wt Readings from Last 1 Encounters:  09/09/23 100.2 kg    Ideal Body Weight:  54.5 kg  BMI:  Body mass index is 38.22 kg/m.  Estimated Nutritional Needs:   Kcal:  1600-1800  Protein:  75-90g  Fluid:  >/=1.5L  Whitney [name], RDN, LDN Clinical Nutrition See AMiON for contact information.

## 2023-09-11 NOTE — Progress Notes (Addendum)
 Advanced Heart Failure Rounding Note  Cardiologist: Janelle Mediate, MD  AHF/PH Clinic: Dr. Mitzie Anda  Chief Complaint: HFpEF, Pulmonary Hypertension Subjective:    Remains in ST. BP stable off pressors.  CVP 18-19. Only 930 cc UOP. Weight significantly down, doubt accuracy.  sCr 1.84>1.83>1.94     CO2 36    Na 149 FWF 200cc q6h     NH3 131>71>38 AF w rates in the 120s. Given IV Lopressor  x2.   High stool OP with lactulose  + rifaximin.  Intermittently confused. Feeling okay this morning. Granddaughter at bedside.  Objective:    Weight Range: 100.2 kg Body mass index is 38.22 kg/m.   Vital Signs:   Temp:  [97.1 F (36.2 C)-98.2 F (36.8 C)] 97.1 F (36.2 C) (05/07 0741) Pulse Rate:  [75-131] 77 (05/07 0700) Resp:  [16-23] 19 (05/07 0700) BP: (88-138)/(68-111) 106/72 (05/07 0700) SpO2:  [96 %-100 %] 100 % (05/07 0723) Last BM Date : 09/10/23  Weight change: Filed Weights   09/04/23 0500 09/05/23 0500 09/09/23 0500  Weight: 107.1 kg 106 kg 100.2 kg   Intake/Output:  Intake/Output Summary (Last 24 hours) at 09/11/2023 0816 Last data filed at 09/11/2023 0800 Gross per 24 hour  Intake 1300 ml  Output 1780 ml  Net -480 ml    Physical Exam   CVP 18-19 General: Confused, frail appearing. No distress on [place] Cardiac: JVP to ears b/l. S1 and S2 present. No murmurs or rub. Abdomen: Soft, non-tender, non-distended.  Extremities: Warm and dry.  +2 BLE edema.  Neuro: intermittent confusion, answers orientation questions. Affect pleasant.  Lines/Devices:  RUE PICC, foley, rectal tube, NGT  Telemetry   Persistent AF 110-120 (personally reviewed)  Labs    CBC Recent Labs    09/10/23 0436 09/11/23 0555  WBC 7.4 7.3  NEUTROABS  --  4.3  HGB 13.9 13.2  HCT 44.6 43.1  MCV 97.2 99.1  PLT 51* 46*   Basic Metabolic Panel Recent Labs    16/10/96 0436 09/11/23 0555 09/11/23 0725  NA 150* 147* 149*  K 4.1 6.7* 3.5  CL 104 106 104  CO2 34* 36* 36*  GLUCOSE 125* 109*  108*  BUN 49* 46* 47*  CREATININE 1.84* 1.83* 1.96*  CALCIUM  10.4* 9.4 9.6  MG 2.4 2.0  --   PHOS 3.7  --   --    BNP (last 3 results) Recent Labs    09/03/23 0320 09/04/23 0322 09/05/23 0419  BNP 2,106.9* 0,454.0* 1,540.4*   ProBNP (last 3 results) Recent Labs    03/13/23 1428  PROBNP 996.0*   Medications:    Scheduled Medications:  apixaban   5 mg Oral BID   Chlorhexidine  Gluconate Cloth  6 each Topical Q0600   ezetimibe   10 mg Oral QHS   feeding supplement  237 mL Oral BID BM   free water   200 mL Per Tube Q6H   gabapentin   100 mg Oral TID   hydroxychloroquine   200 mg Oral Daily   lactulose   30 g Oral TID   lidocaine   1 patch Transdermal Q24H   megestrol  320 mg Oral Daily   metoprolol  tartrate  2.5 mg Intravenous Q6H   midodrine   15 mg Oral TID WC   multivitamin with minerals  1 tablet Oral BID   pantoprazole   40 mg Oral Daily   polyethylene glycol  17 g Oral Daily   revefenacin   175 mcg Nebulization Daily   rifaximin  550 mg Oral BID   rosuvastatin   10 mg Oral Daily   senna  2 tablet Oral Daily   sodium chloride  flush  10-40 mL Intracatheter Q12H   torsemide  80 mg Oral Daily   Infusions:    PRN Medications: acetaminophen  **OR** acetaminophen , albuterol , alum & mag hydroxide-simeth, antiseptic oral rinse, bisacodyl , lip balm, melatonin, naLOXone  (NARCAN )  injection, ondansetron  (ZOFRAN ) IV, mouth rinse, sodium chloride  flush  Patient Profile   Whitney [name] is a 68-year-old female with a complex past medical history of Sjogren syndrome, cirrhosis, rheumatic heart disease with bioprosthetic AVR, mitral valve disease, pulmonary hypertension.    Assessment/Plan   HFpEF/ high output HF Pulmonary hypertension - RHC in 2024 most consistent with precapillary PH, started on tadalafil . Done after diuresis, and suspect this underestimates her group II component, especially given mitral valve disease - Does have a large component of portopulmonary HTN, especially  given high cardiac output - Echo this admission: EF >75%, RV moderately enlarged, RVSP 51 mmHg, severe LAE - Hypotension on admission, suspect liver disease is main driver. Initially required NE and VP. Now off IV pressor support. SBPs stable in the 90s. - continue midodrine  15 mg tid  - Remains significantly volume overloaded.  - Switch back to Lasix  160 mg IV bid today + metolazone  5 mg - GDMT limited by renal dysfunction and hypotension  - Poor SGLT2i candidate - Hold Tadalifil, suspect will not be able to resume given need high dose midodrine . - We discussed RHC w/ patient and family. Due to intermittent confusion and the severity of her heart disease, liver disease, pulmonary hypertension and CKD we opted for medical therapy only at this time   Acute on chronic hypoxic and hypercapnic respiratory failure - In setting of COPD, OSA and volume overload - Now on [place], 3L. Intermittent confusion - CO2 36, needs to use BiPAP at night   AKI - Suspect mostly hepatorenal +/- component of cardiorenal - Prev improved with diuresis, now trending up. Likely in the setting of volume re-accumulation - continue midodrine  for BP support  - diuresis as above  Valvular disease  - S/p bioprosthetic AVR, severe MAC - Gradients mildly high for AVR, but otherwise functioning well   Cirrhosis - due to NAFLD, w/ portal HTN - Follows at [facility] - Nadolol  on hold d/t hypotension  - continue lactulose  + rifaximin.  NH3 131>71>38 - high stool OP  PAF:  - Continue eliquis . Monitor thrombocytopenia (46 K) - rates variable, 110s-120s on tele but asymptomatic   - limited rate/rhythm control options  - avoiding amio w/ liver dz - monitor   Thrombocytopenia - In setting of cirrhosis - Plts have drifted from 80s>>46K, historically 50s-60s. No gross bleeding  - Watch closely with anticoagulation  Hypernatremia - Na 149 - FWF 200cc Q6. CCM to manage    Length of Stay: 35  [place] Lee, NP  09/11/2023, 8:16  AM  Advanced Heart Failure Team Pager [phone] (M-F; 7a - 5p)  Please contact [facility] for night-coverage after hours (5p -7a ) and weekends on [email or web address]  Patient seen with NP, I formulated the plan and agree with the above note.    She did not have an aggressive diuresis yesterday. Creatinine 1.84 => 1.96. MAP stable on midodrine  15 mg tid.  NH3 lower at 38 on lactulose  and rifaximin.    CVP 18-20 today.   She is in AF with RVR, HR up to 120s-130s, currently 110s.   General: NAD Neck: JVP 16+, no thyromegaly or thyroid  nodule.  Lungs:  Clear to auscultation bilaterally with normal respiratory effort. CV: Nondisplaced PMI.  Heart regular S1/S2, no S3/S4, 2/6 SEM RUSB.  Trace ankle edema.  Abdomen: Soft, nontender, no hepatosplenomegaly, no distention.  Skin: Intact without lesions or rashes.  Neurologic: Alert and oriented x 3.  Psych: Normal affect. Extremities: No clubbing or cyanosis.  HEENT: Normal.   Patient has cirrhosis due to NAFLD with portopulmonary hypertension and RV failure.  She is significantly volume overloaded with CVP 18-20 but diuresing poorly and creatinine rising.  - She received Lasix  160 mg IV x 1, will start Lasix  gtt 20 mg/hr.  - Dose of metolazone  5 mg x 1 today.  - Dose of acetazolamide  500 mg IV x 1 today with rising HCO3.  - Start spironolactone  25 daily - Continue midodrine  10 tid.  - Tadalafil  on hold with low BP.   NH3 trending down with lactulose  and rifaximin (hepatic encephalopathy).  She is alert/oriented during conversation with me today.   Still in AF with RVR up to 130s.  Will start Toprol  XL 25 mg bid.  Hopefully BP will tolerate with midodrine .  Trying to avoid amiodarone  with decompensated liver disease.  She will remain on apixaban .   CRITICAL CARE Performed by: Peder Bourdon   Total critical care time: 40 minutes  Critical care time was exclusive of separately billable procedures and treating other patients.  Critical care  was necessary to treat or prevent imminent or life-threatening deterioration.  Critical care was time spent personally by me on the following activities: development of treatment plan with patient and/or surrogate as well as nursing, discussions with consultants, evaluation of patient's response to treatment, examination of patient, obtaining history from patient or surrogate, ordering and performing treatments and interventions, ordering and review of laboratory studies, ordering and review of radiographic studies, pulse oximetry and re-evaluation of patient's condition.  Peder Bourdon 09/11/2023 2:41 PM

## 2023-09-12 DIAGNOSIS — D696 Thrombocytopenia, unspecified: Secondary | ICD-10-CM | POA: Diagnosis not present

## 2023-09-12 DIAGNOSIS — Z9189 Other specified personal risk factors, not elsewhere classified: Secondary | ICD-10-CM

## 2023-09-12 DIAGNOSIS — J9622 Acute and chronic respiratory failure with hypercapnia: Secondary | ICD-10-CM | POA: Diagnosis not present

## 2023-09-12 DIAGNOSIS — G9340 Encephalopathy, unspecified: Secondary | ICD-10-CM | POA: Diagnosis not present

## 2023-09-12 DIAGNOSIS — K7581 Nonalcoholic steatohepatitis (NASH): Secondary | ICD-10-CM | POA: Diagnosis not present

## 2023-09-12 DIAGNOSIS — J449 Chronic obstructive pulmonary disease, unspecified: Secondary | ICD-10-CM | POA: Diagnosis not present

## 2023-09-12 LAB — GLUCOSE, CAPILLARY
Glucose-Capillary: 149 mg/dL — ABNORMAL HIGH (ref 70–99)
Glucose-Capillary: 170 mg/dL — ABNORMAL HIGH (ref 70–99)
Glucose-Capillary: 181 mg/dL — ABNORMAL HIGH (ref 70–99)
Glucose-Capillary: 187 mg/dL — ABNORMAL HIGH (ref 70–99)

## 2023-09-12 LAB — CBC WITH DIFFERENTIAL/PLATELET
Abs Immature Granulocytes: 0.02 10*3/uL (ref 0.00–0.07)
Basophils Absolute: 0 10*3/uL (ref 0.0–0.1)
Basophils Relative: 0 %
Eosinophils Absolute: 0.2 10*3/uL (ref 0.0–0.5)
Eosinophils Relative: 3 %
HCT: 43.6 % (ref 36.0–46.0)
Hemoglobin: 13.5 g/dL (ref 12.0–15.0)
Immature Granulocytes: 0 %
Lymphocytes Relative: 22 %
Lymphs Abs: 1.6 10*3/uL (ref 0.7–4.0)
MCH: 30.1 pg (ref 26.0–34.0)
MCHC: 31 g/dL (ref 30.0–36.0)
MCV: 97.3 fL (ref 80.0–100.0)
Monocytes Absolute: 1 10*3/uL (ref 0.1–1.0)
Monocytes Relative: 13 %
Neutro Abs: 4.5 10*3/uL (ref 1.7–7.7)
Neutrophils Relative %: 62 %
Platelets: 38 10*3/uL — ABNORMAL LOW (ref 150–400)
RBC: 4.48 MIL/uL (ref 3.87–5.11)
RDW: 17.2 % — ABNORMAL HIGH (ref 11.5–15.5)
WBC: 7.3 10*3/uL (ref 4.0–10.5)
nRBC: 0 % (ref 0.0–0.2)

## 2023-09-12 LAB — BASIC METABOLIC PANEL WITH GFR
Anion gap: 8 (ref 5–15)
BUN: 57 mg/dL — ABNORMAL HIGH (ref 8–23)
CO2: 36 mmol/L — ABNORMAL HIGH (ref 22–32)
Calcium: 8.9 mg/dL (ref 8.9–10.3)
Chloride: 96 mmol/L — ABNORMAL LOW (ref 98–111)
Creatinine, Ser: 2.08 mg/dL — ABNORMAL HIGH (ref 0.44–1.00)
GFR, Estimated: 25 mL/min — ABNORMAL LOW (ref >60)
Glucose, Bld: 237 mg/dL — ABNORMAL HIGH (ref 70–99)
Potassium: 3.5 mmol/L (ref 3.5–5.1)
Sodium: 140 mmol/L (ref 135–145)

## 2023-09-12 LAB — COMPREHENSIVE METABOLIC PANEL WITH GFR
ALT: 17 U/L (ref 0–44)
AST: 59 U/L — ABNORMAL HIGH (ref 15–41)
Albumin: 2.6 g/dL — ABNORMAL LOW (ref 3.5–5.0)
Alkaline Phosphatase: 81 U/L (ref 38–126)
Anion gap: 7 (ref 5–15)
BUN: 50 mg/dL — ABNORMAL HIGH (ref 8–23)
CO2: 36 mmol/L — ABNORMAL HIGH (ref 22–32)
Calcium: 9.3 mg/dL (ref 8.9–10.3)
Chloride: 100 mmol/L (ref 98–111)
Creatinine, Ser: 1.77 mg/dL — ABNORMAL HIGH (ref 0.44–1.00)
GFR, Estimated: 31 mL/min — ABNORMAL LOW (ref >60)
Glucose, Bld: 195 mg/dL — ABNORMAL HIGH (ref 70–99)
Potassium: 2.8 mmol/L — ABNORMAL LOW (ref 3.5–5.1)
Sodium: 143 mmol/L (ref 135–145)
Total Bilirubin: 2.3 mg/dL — ABNORMAL HIGH (ref 0.0–1.2)
Total Protein: 6 g/dL — ABNORMAL LOW (ref 6.5–8.1)

## 2023-09-12 LAB — COOXEMETRY PANEL
Carboxyhemoglobin: 1.8 % — ABNORMAL HIGH (ref 0.5–1.5)
Methemoglobin: <0.7 % (ref 0.0–1.5)
O2 Saturation: 78.7 %
Total hemoglobin: 13.3 g/dL (ref 12.0–16.0)

## 2023-09-12 LAB — PHOSPHORUS: Phosphorus: 3.1 mg/dL (ref 2.5–4.6)

## 2023-09-12 LAB — MAGNESIUM
Magnesium: 2 mg/dL (ref 1.7–2.4)
Magnesium: 2.1 mg/dL (ref 1.7–2.4)

## 2023-09-12 MED ORDER — POTASSIUM CHLORIDE 10 MEQ/50ML IV SOLN
10.0000 meq | INTRAVENOUS | Status: AC
  Administered 2023-09-12 (×4): 10 meq via INTRAVENOUS
  Filled 2023-09-12 (×4): qty 50

## 2023-09-12 MED ORDER — POTASSIUM CHLORIDE 20 MEQ PO PACK
40.0000 meq | PACK | Freq: Once | ORAL | Status: AC
  Administered 2023-09-12: 40 meq via ORAL
  Filled 2023-09-12: qty 2

## 2023-09-12 MED ORDER — ACETAZOLAMIDE SODIUM 500 MG IJ SOLR
500.0000 mg | Freq: Two times a day (BID) | INTRAMUSCULAR | Status: AC
  Administered 2023-09-12 (×2): 500 mg via INTRAVENOUS
  Filled 2023-09-12 (×2): qty 500

## 2023-09-12 MED ORDER — GERHARDT'S BUTT CREAM
TOPICAL_CREAM | Freq: Three times a day (TID) | CUTANEOUS | Status: DC
Start: 2023-09-12 — End: 2023-09-15
  Filled 2023-09-12: qty 60

## 2023-09-12 MED ORDER — STERILE WATER FOR INJECTION IJ SOLN
INTRAMUSCULAR | Status: AC
  Filled 2023-09-12: qty 10

## 2023-09-12 MED ORDER — POTASSIUM CHLORIDE 20 MEQ PO PACK
40.0000 meq | PACK | Freq: Once | ORAL | Status: AC
  Administered 2023-09-12: 40 meq
  Filled 2023-09-12: qty 2

## 2023-09-12 MED ORDER — APIXABAN 2.5 MG PO TABS
2.5000 mg | ORAL_TABLET | Freq: Two times a day (BID) | ORAL | Status: DC
  Administered 2023-09-12 – 2023-09-13 (×2): 2.5 mg via ORAL
  Filled 2023-09-12 (×2): qty 1

## 2023-09-12 MED ORDER — LACTULOSE 10 GM/15ML PO SOLN
30.0000 g | Freq: Every day | ORAL | Status: DC
  Administered 2023-09-13: 30 g via ORAL
  Filled 2023-09-12: qty 45

## 2023-09-12 MED ORDER — METOLAZONE 5 MG PO TABS
5.0000 mg | ORAL_TABLET | Freq: Once | ORAL | Status: AC
  Administered 2023-09-12: 5 mg via ORAL
  Filled 2023-09-12: qty 1

## 2023-09-12 NOTE — Progress Notes (Addendum)
 Advanced Heart Failure Rounding Note  Cardiologist: Janelle Mediate, MD  AHF/PH Clinic: Dr. Mitzie Anda  Chief Complaint: HFpEF, Pulmonary Hypertension Subjective:    Placed on Lasix  gtt yesterday at 20/hr + 5 of metolazone  + diamox  500 mg x 1 w/ only 2L in UOP. CVP remains elevated at 21.    SCr 1.87>>1.77 today K 2.8, Mg 2.0  Na 143 CO2 36   Foley removed this morning, given frequent bowel movements/fecal incontinence and concern for UTI risk   Also started on Toprol  yesterday for rate control. Continues in Afib, 110s. SBPs in the 80s-90s on midodrine  15 mg tid   Resting comfortably. Somnolent but arousable. Oriented to person and place, not time. No respiratory difficulty.   Objective:    Weight Range: 99.3 kg Body mass index is 37.87 kg/m.   Vital Signs:   Temp:  [97.6 F (36.4 C)-98.2 F (36.8 C)] 97.8 F (36.6 C) (05/08 0740) Pulse Rate:  [82-124] 102 (05/08 1000) Resp:  [14-25] 23 (05/08 1037) BP: (87-127)/(64-92) 97/82 (05/08 1037) SpO2:  [93 %-100 %] 97 % (05/08 1000) FiO2 (%):  [40 %] 40 % (05/08 0000) Weight:  [99.3 kg] 99.3 kg (05/08 0445) Last BM Date : 09/11/23  Weight change: Filed Weights   09/05/23 0500 09/09/23 0500 09/12/23 0445  Weight: 106 kg 100.2 kg 99.3 kg   Intake/Output:  Intake/Output Summary (Last 24 hours) at 09/12/2023 1103 Last data filed at 09/12/2023 1000 Gross per 24 hour  Intake 3075.25 ml  Output 1725 ml  Net 1350.25 ml    Physical Exam   CVP 21 General:  fatigued appearing. Somnolent but arousable.  No respiratory difficulty HEENT: + cor trak normal Neck: supple. JVD elevated to ear.  Cor: Irregularly irregular rhythm and rate No rubs, gallops or murmurs. Lungs: decreased BS at the bases  Abdomen: soft, nontender, mildly distended.  Extremities: no cyanosis, clubbing, rash, 1+ b/l LE edema + RUE PICC  Neuro: Somnolent but arousable oriented x 2 (person/place), affect flat   Telemetry   Persistent Afib, 110s   (personally reviewed)  Labs    CBC Recent Labs    09/11/23 0555 09/11/23 0818 09/12/23 0430  WBC 7.3  --  7.3  NEUTROABS 4.3  --  4.5  HGB 13.2 13.9 13.5  HCT 43.1 41.0 43.6  MCV 99.1  --  97.3  PLT 46*  --  38*   Basic Metabolic Panel Recent Labs    16/10/96 0436 09/11/23 0555 09/11/23 0725 09/11/23 1400 09/12/23 0430  NA 150* 147*   < > 145 143  K 4.1 6.7*   < > 3.8 2.8*  CL 104 106   < > 101 100  CO2 34* 36*   < > 33* 36*  GLUCOSE 125* 109*   < > 210* 195*  BUN 49* 46*   < > 51* 50*  CREATININE 1.84* 1.83*   < > 1.87* 1.77*  CALCIUM  10.4* 9.4   < > 9.3 9.3  MG 2.4 2.0  --   --  2.0  PHOS 3.7  --   --   --  3.1   < > = values in this interval not displayed.   BNP (last 3 results) Recent Labs    09/03/23 0320 09/04/23 0322 09/05/23 0419  BNP 2,106.9* 0,454.0* 1,540.4*   ProBNP (last 3 results) Recent Labs    03/13/23 1428  PROBNP 996.0*   Medications:    Scheduled Medications:  apixaban   5 mg Oral BID  Chlorhexidine  Gluconate Cloth  6 each Topical Q0600   ezetimibe   10 mg Oral QHS   feeding supplement  237 mL Oral BID BM   feeding supplement (PROSource TF20)  60 mL Per Tube Daily   free water   200 mL Per Tube Q6H   gabapentin   100 mg Oral TID   Gerhardt's butt cream   Topical TID   hydroxychloroquine   200 mg Oral Daily   [START ON 09/13/2023] lactulose   30 g Oral Daily   lidocaine   1 patch Transdermal Q24H   megestrol  320 mg Oral Daily   metoprolol  succinate  25 mg Oral BID   midodrine   15 mg Oral TID WC   multivitamin with minerals  1 tablet Oral BID   pantoprazole   40 mg Oral Daily   polyethylene glycol  17 g Oral Daily   revefenacin   175 mcg Nebulization Daily   rifaximin  550 mg Oral BID   rosuvastatin   10 mg Oral Daily   senna  2 tablet Oral Daily   sodium chloride  flush  10-40 mL Intracatheter Q12H   spironolactone   25 mg Oral Daily   Infusions:  feeding supplement (OSMOLITE 1.5 CAL) 45 mL/hr at 09/12/23 1000   furosemide  (LASIX )  200 mg in dextrose  5 % 100 mL (2 mg/mL) infusion 20 mg/hr (09/12/23 1000)     PRN Medications: acetaminophen  **OR** acetaminophen , albuterol , alum & mag hydroxide-simeth, antiseptic oral rinse, bisacodyl , lip balm, melatonin, naLOXone  (NARCAN )  injection, ondansetron  (ZOFRAN ) IV, mouth rinse, sodium chloride  flush  Patient Profile   Whitney [name] is a 68-year-old female with a complex past medical history of Sjogren syndrome, cirrhosis, rheumatic heart disease with bioprosthetic AVR, mitral valve disease, pulmonary hypertension.    Assessment/Plan   HFpEF/ high output HF Pulmonary hypertension - RHC in 2024 most consistent with precapillary PH, started on tadalafil . Done after diuresis, and suspect this underestimates her group II component, especially given mitral valve disease - Does have a large component of portopulmonary HTN, especially given high cardiac output - Echo this admission: EF >75%, RV moderately enlarged, RVSP 51 mmHg, severe LAE - Hypotension on admission, suspect liver disease is main driver. Initially required NE and VP. Now off IV pressor support. SBPs stable in the 90s w/ midodrine  support. - now volume overloaded w/ CVP up to 21 and supar response to high dose diuretics. SCr stable at 1.8, CO2 36 - continue lasix  gtt at 20 hr, Increase Diamox  to 500 mg bid. Repeat metolazone  5. If poor response will increase lasix  gtt to 30/hr   - concern for low output, will check Co-ox though think tx w/ inotropes would be difficult given Afib  - continue midodrine  15 mg tid  - GDMT limited by renal dysfunction and hypotension  - Poor SGLT2i candidate - Hold Tadalifil, suspect will not be able to resume given need high dose midodrine . - We discussed RHC w/ patient and family. Due to intermittent confusion and the severity of her heart disease, liver disease, pulmonary hypertension and CKD we opted for medical therapy only at this time   Acute on chronic hypoxic and hypercapnic  respiratory failure - In setting of COPD, OSA and volume overload - Now on [place], 3L. Intermittent confusion - CO2 36, needs to use BiPAP at night. Give diamox  today   AKI - Suspect mostly hepatorenal +/- component of cardiorenal - Prev improved with diuresis, now trending up. Likely in the setting of volume re-accumulation - continue midodrine  for BP support  -  diuresis as above  Valvular disease  - S/p bioprosthetic AVR, severe MAC - Gradients mildly high for AVR, but otherwise functioning well   Cirrhosis - due to NAFLD, w/ portal HTN - Follows at [facility] - Nadolol  on hold d/t hypotension  - continue lactulose  + rifaximin.  NH3 131>71>38   PAF:  - Continue eliquis . Monitor thrombocytopenia (46 K) - rates variable, 110s-120s on tele but asymptomatic   - limited rate/rhythm control options  - avoiding amio w/ liver dz - metoprolol  started 5/7 but need to monitor for s/o reduced CO  - monitor   Thrombocytopenia - In setting of cirrhosis - Plts have drifted from 80s>>46>>38K, historically 50s-60s. No gross bleeding  - Watch closely with anticoagulation  Hypernatremia - improving Na 149>>147>>143 today  - FWF 200cc Q6. CCM to manage    Hypokalemia - K 2.8, in setting of diuresis + high stool output from lactulose  + rifaximin - aggressive K supp per PharmD   Length of Stay: [address], PA-C  09/12/2023, 11:03 AM  Advanced Heart Failure Team Pager [phone] (M-F; 7a - 5p)  Please contact [facility] for night-coverage after hours (5p -7a ) and weekends on [email or web address]  Patient seen with PA, I formulated the plan and agree with the above note.    She is more alert this afternoon, oriented to person/place and can answer questions.    Diuresis still not good, I/Os even yesterday.  Creatinine mildly lower at 1.77.  SBP 90s on midodrine  15 mg tid.  She remains in AF, HR generally 110s.   General: NAD Neck: JVP 16+, no thyromegaly or thyroid  nodule.  Lungs: Clear  to auscultation bilaterally with normal respiratory effort. CV: Nondisplaced PMI.  Heart tachy, irregular S1/S2, no S3/S4, no murmur.  1+ ankle edema.  Abdomen: Soft, nontender, no hepatosplenomegaly, no distention.  Skin: Intact without lesions or rashes.  Neurologic: Mild confusion at times, currently alert/oriented.  Extremities: No clubbing or cyanosis.  HEENT: Normal.   Patient has cirrhosis due to NAFLD with portopulmonary hypertension and RV failure.  She remains significantly volume overloaded but diuresing poorly.  Creatinine mildly lower today at 1.77.  - Increase Lasix  to 30 mg/hr.  - Dose of metolazone  5 mg x 1 today.  - Acetazolamide  500 mg IV bid today with rising HCO3.  - Continue spironolactone  25 daily - Continue midodrine  15 tid.  - Tadalafil  on hold with low BP.    NH3 trending down with lactulose  and rifaximin (hepatic encephalopathy).  She is alert/oriented during conversation with me today.    Remains in AF but rate lower today in 110s.  Continue Toprol  XL 25 mg bid.  Hopefully BP will continue to tolerate with midodrine .  Trying to avoid amiodarone  with decompensated liver disease.  She will remain on apixaban .   Peder Bourdon 09/12/2023 1:46 PM

## 2023-09-12 NOTE — Plan of Care (Signed)

## 2023-09-12 NOTE — Progress Notes (Signed)
 [facility] ADULT ICU REPLACEMENT PROTOCOL   The patient does apply for the [facility] Adult ICU Electrolyte Replacment Protocol based on the criteria listed below:   1.Exclusion criteria: TCTS, ECMO, Dialysis, and Myasthenia Gravis patients 2. Is GFR >/= 30 ml/min? Yes.    Patient's GFR today is 31 3. Is SCr </= 2? Yes.   Patient's SCr is 1.77 mg/dL 4. Did SCr increase >/= 0.5 in 24 hours? No. 5.Pt's weight >40kg  Yes.   6. Abnormal electrolyte(s): potassium 2.8  7. Electrolytes replaced per protocol 8.  Call MD STAT for K+ </= 2.5, Phos </= 1, or Mag </= 1 Physician:  protocol  Marva Sleight 09/12/2023 5:47 AM

## 2023-09-12 NOTE — Consult Note (Addendum)
 Addendum Patient seen and examined. History reviewed including outside records pertinent to her thrombocytopenia, agree with plans below by APP.  Acute on chronic thrombocytopenia with history of cirrhosis. Clinically stable without bleeding. May continue monitor.  She is at risk of both thrombosis and hemorrhage. Given on anticoagulation, recommend the following:  If platelet >50K, continue apixaban  5 mg twice daily If platelet 30-50K, apixaban  2.5 mg twice daily. If platelet <30K, hold apixaban . Avoid antiplatelet and NSAIDs. Transfusion as below.  Thank you for the consult.  [facility]  [phone] [phone]    HEMATOLOGY CONSULTATION  PURPOSE OF CONSULTATION/CHIEF COMPLAINT: Hx of thrombocytopenia  Referring MD:  Dr. Marene Shape   HPI: Whitney [name] is a 68 year old female patient who was admitted on 08/25/2023 with complaint of altered mental status.  Apparently patient had been bitten by her dog a few days prior to admission and family noted generalized weakness with intermittent slurring of her speech.  Further workup was done in the ED including imaging and labs and patient was admitted. Hematology consult is been requested as patient previously was seen by hematologist/Dr. Rosaline Coma for complaint of thrombocytopenia. Patient is seen today asleep in the ICU with NG tube feeding ongoing.   Two family members at bedside who serve as historians, state that patient is tired because there have been other family members visiting today.  They state that her long-term memory remains good however her short-term memory is not as good.  State she still knows how to do her monthly bills and recently inquired if her mortgage was paid. Medical history as stated is significant for thrombocytopenia, leukopenia, and cirrhosis.  Also significant for A-fib on Eliquis . Surgical history significant for and includes gastric sleeve in 2015, cardiac cath in  2024. Family history includes father with Alzheimer's. Social history-family denies tobacco use, alcohol use, drug use.  Patient was formerly in education as a school principal.  ASSESSMENT AND PLAN:  Thrombocytopenia -Patient has seen hematologist Dr. Rosaline Coma in the past.  Last visit 08/02/2022.  At that time, Eliquis  was recommended as long as her platelets were above 50K.  If platelets dropped below 50K, then it was recommended to decrease Eliquis  to 2.5 mg p.o. daily. - Platelets are low 38K today. - Baseline platelets appear to be in the 40s -70 range. - Likely secondary to cirrhosis - Transfuse platelets for counts <10K or <50 K with active bleeding - No transfusional intervention required at this time - Monitor closely for bleeding - Continue to monitor CBC with differential - Palliative team following - Hematology/Dr. Alita Irwin following  Hepatic cirrhosis -Diagnosed in 2014, NASH cirrhosis - Has [facility]  Altered mental status - Intermittent confusion - Ammonia level WNL - Continue supportive care  A-fib Heart failure - Has been on Eliquis  - Continue meds per heart failure team  History of leukopenia History of anemia -WBCs 7.3, adequate at this time.  WBCs appear to be in the 3 range during the last year. - Anemia mild - Hemoglobin stable 13.5 - Likely secondary to cirrhosis - Continue to monitor CBC with differential  Hypertension Hyperlipidemia Pulmonary hypertension Sjogren syndrome -Continue to monitor blood pressure closely - On hydroxychloroquine  - Critical care team following closely    Past Medical History:  Diagnosis Date   Allergic rhinitis    Anemia    hx   Arthritis    Asthma    hx yrs ago   Cirrhosis (HCC) last albumin  3.3 done at [facility] 06-16-2014 (  under care everywhere tab in epic)   Secondary to Fatty liver --  followed by hepatology at [facility] (dr Orest Bio)    Depression    Diabetes mellitus type II    type 2 diet conrolled    Dyspnea    Fibromyalgia    GERD (gastroesophageal reflux disease)    Heart murmur    asymptomatic ---  1989 from rhuematic fever   History of exercise stress test    05-05-2013---  negative bruce ETT given exercise workload,  no ischemia   History of hiatal hernia    History of kidney stones    History of rheumatic fever    1989   Hyperlipidemia    Leukocytopenia    Moderate aortic stenosis    AVA area 1.1cm2---  cardiologist --  dr Armanda Lan, 2014 in epic   NASH (nonalcoholic steatohepatitis)    OSA (obstructive sleep apnea)    was using CPAP before gastric sleeve 2015--  no uses after wt loss   Pneumonia    hx   Pulmonary hypertension (HCC)    Sjogren's syndrome (HCC)    Thrombocytopenia (HCC)   :  Past Surgical History:  Procedure Laterality Date   AORTIC VALVE REPLACEMENT N/A 12/05/2017   Procedure: AORTIC VALVE REPLACEMENT (AVR) TISSUE VALVE INSPIRIS;  Surgeon: Bartley Lightning, MD;  Location: [facility] OR;  Service: Open Heart Surgery;  Laterality: N/A;   COLONOSCOPY WITH ESOPHAGOGASTRODUODENOSCOPY (EGD)     CYSTOSCOPY WITH RETROGRADE PYELOGRAM, URETEROSCOPY AND STENT PLACEMENT Left 01/21/2015   Procedure: CYSTOSCOPY WITH LEFT  RETROGRADE PYELOGRAM, LEFT URETEROSCOPY AND STENT PLACEMENT;  Surgeon: Christina Coyer, MD;  Location: [facility];  Service: Urology;  Laterality: Left;   CYSTOSCOPY WITH RETROGRADE PYELOGRAM, URETEROSCOPY AND STENT PLACEMENT Bilateral 02/24/2015   Procedure: CYSTOSCOPY WITH RIGHT RETROGRADE PYELOGRAM, BLADDER BIOPSY FULGERATION LEFT URETEROSCOPY AND STENT REPLACEMENT;  Surgeon: Christina Coyer, MD;  Location: [facility];  Service: Urology;  Laterality: Bilateral;   EXPLORATORY LAPAROSCOPY W/  CONE BIOPSY'S LEFT AND RIGHT LOBE OF LIVER  11-04-2007   HOLMIUM LASER APPLICATION Left 02/24/2015   Procedure: HOLMIUM LASER LITHOTRIPSY;  Surgeon: Christina Coyer, MD;  Location: [facility];  Service: Urology;   Laterality: Left;   HYSTEROSCOPY WITH D & C N/A 12/11/2012   Procedure: DILATATION AND CURETTAGE /HYSTEROSCOPY;  Surgeon: Lizette Righter. Wynona Hedger, MD;  Location: [facility];  Service: Gynecology;  Laterality: N/A;   INGUINAL HERNIA REPAIR Left 10/22/2016   Procedure: LEFT INGUINAL HERNIA REPAIR;  Surgeon: Enid Harry, MD;  Location: [facility] OR;  Service: General;  Laterality: Left;  TAP BLOCK   INSERTION OF MESH Left 10/22/2016   Procedure: INSERTION OF MESH;  Surgeon: Enid Harry, MD;  [facility];  Service: General;  Laterality: Left;   LAPAROSCOPIC GASTRIC SLEEVE RESECTION  07-27-2013   LOOP RECORDER INSERTION N/A 04/16/2022   Procedure: LOOP RECORDER INSERTION;  Surgeon: Verona Goodwill, MD;  Location: [facility] INVASIVE CV LAB;  Service: Cardiovascular;  Laterality: N/A;   PUBOVAGINAL SLING  04-10-2001   [facility]   RIGHT HEART CATH N/A 04/11/2023   Procedure: RIGHT HEART CATH;  Surgeon: Darlis Eisenmenger, MD;  Location: [facility] INVASIVE CV LAB;  Service: Cardiovascular;  Laterality: N/A;   RIGHT/LEFT HEART CATH AND CORONARY ANGIOGRAPHY N/A 08/23/2017   Procedure: RIGHT/LEFT HEART CATH AND CORONARY ANGIOGRAPHY;  Surgeon: Lucendia Rusk, MD;  Location: [facility] INVASIVE CV LAB;  Service: Cardiovascular;  Laterality: N/A;   TEE WITHOUT CARDIOVERSION N/A 12/05/2017  Procedure: TRANSESOPHAGEAL ECHOCARDIOGRAM (TEE);  Surgeon: Bartley Lightning, MD;  Location: [facility] OR;  Service: Open Heart Surgery;  Laterality: N/A;   TONSILLECTOMY  1975   TRANSTHORACIC ECHOCARDIOGRAM  06-04-2012  dr Jacquelynn Matter   mild LVH,  grade I diastolic dysfunction/  ef 60-65%/  moderate LAE/  mild MV calcifation without stenosis,  mild MR/  moderate AV stenosis,  cannot r/o bicupsid, area 1.1cm2/  mild dilated aortic root/  trivial TR  :  Allergies  Allergen Reactions   Prednisone      Other Reaction(s): delerium   Doxycycline Hives and Rash   Naproxen Rash and Hives  :   Family History  Problem Relation Age of Onset   Alzheimer's disease  Father    Hip fracture Father    Asthma Father    Heart disease Father    Heart attack Father    Hypertension Father    Rheum arthritis Mother    Heart disease Mother    Allergies Daughter    Asthma Paternal Grandmother    Asthma Daughter    Stroke Neg Hx   :   Social History   Socioeconomic History   Marital status: Widowed    Spouse name: Married to [employer]   Number of children: Not on file   Years of education: Not on file   Highest education level: Not on file  Occupational History   Occupation: principal of [employer]  Tobacco Use   Smoking status: Never    Passive exposure: Past   Smokeless tobacco: Never   Tobacco comments:    Never smoke 05/08/22  Vaping Use   Vaping status: Never Used  Substance and Sexual Activity   Alcohol use: Not Currently    Comment: rarely   Drug use: No   Sexual activity: Not on file  Other Topics Concern   Not on file  Social History Narrative   Not on file   Social Drivers of Health   Financial Resource Strain: Not on file  Food Insecurity: No Food Insecurity (08/27/2023)   Hunger Vital Sign    Worried About Running Out of Food in the Last Year: Never true    Ran Out of Food in the Last Year: Never true  Transportation Needs: No Transportation Needs (08/27/2023)   PRAPARE - Administrator, Civil Service (Medical): No    Lack of Transportation (Non-Medical): No  Physical Activity: Not on file  Stress: Not on file  Social Connections: Unknown (08/27/2023)   Social Connection and Isolation Panel [NHANES]    Frequency of Communication with Friends and Family: More than three times a week    Frequency of Social Gatherings with Friends and Family: More than three times a week    Attends Religious Services: Patient unable to answer    Active Member of Clubs or Organizations: Yes    Attends Banker Meetings: More than 4 times per year    Marital Status: Widowed  Intimate Partner Violence: Not At Risk  (08/27/2023)   Humiliation, Afraid, Rape, and Kick questionnaire    Fear of Current or Ex-Partner: No    Emotionally Abused: No    Physically Abused: No    Sexually Abused: No  :   CURRENT MEDS: Current Facility-Administered Medications  Medication Dose Route Frequency Provider Last Rate Last Admin   acetaminophen  (TYLENOL ) tablet 650 mg  650 mg Oral Q6H PRN Utomwen, Adesuwa, RPH       Or   acetaminophen  (TYLENOL ) suppository  650 mg  650 mg Rectal Q6H PRN Utomwen, Adesuwa, RPH       acetaZOLAMIDE  (DIAMOX ) injection 500 mg  500 mg Intravenous BID Ruddy Corral M, PA-C   500 mg at 09/12/23 1121   albuterol  (PROVENTIL ) (2.5 MG/3ML) 0.083% nebulizer solution 2.5 mg  2.5 mg Nebulization Q4H PRN Howerter, Justin B, DO   2.5 mg at 08/29/23 0831   alum & mag hydroxide-simeth (MAALOX/MYLANTA) 200-200-20 MG/5ML suspension 15 mL  15 mL Oral Q6H PRN Utomwen, Adesuwa, RPH       antiseptic oral rinse (BIOTENE) solution 15 mL  15 mL Mouth Rinse PRN Arlina Lair, MD       apixaban  (ELIQUIS ) tablet 5 mg  5 mg Oral BID Utomwen, Adesuwa, RPH   5 mg at 09/12/23 0908   bisacodyl  (DULCOLAX) EC tablet 5 mg  5 mg Oral Daily PRN Lemmie Pyo, NP       Chlorhexidine  Gluconate Cloth 2 % PADS 6 each  6 each Topical Q0600 Mannam, Praveen, MD   6 each at 09/12/23 1000   ezetimibe  (ZETIA ) tablet 10 mg  10 mg Oral QHS Utomwen, Adesuwa, RPH   10 mg at 09/11/23 2126   feeding supplement (ENSURE ENLIVE / ENSURE PLUS) liquid 237 mL  237 mL Oral BID BM Utomwen, Adesuwa, RPH   237 mL at 09/11/23 0950   feeding supplement (OSMOLITE 1.5 CAL) liquid 1,000 mL  1,000 mL Per Tube Continuous Albustami, Omar M, MD 45 mL/hr at 09/12/23 1400 Infusion Verify at 09/12/23 1400   feeding supplement (PROSource TF20) liquid 60 mL  60 mL Per Tube Daily Arlyne Bering, MD   60 mL at 09/12/23 9604   free water  200 mL  200 mL Per Tube Q6H Shelli Dexter M, PA-C   200 mL at 09/12/23 1122   furosemide  (LASIX ) 200 mg in dextrose  5 %  100 mL (2 mg/mL) infusion  30 mg/hr Intravenous Continuous Darlis Eisenmenger, MD 15 mL/hr at 09/12/23 1401 30 mg/hr at 09/12/23 1401   gabapentin  (NEURONTIN ) capsule 100 mg  100 mg Oral TID UtomwenDarci East, RPH   100 mg at 09/12/23 5409   Gerhardt's butt cream   Topical TID Gleason, Laura R, PA-C   Given at 09/12/23 1121   hydroxychloroquine  (PLAQUENIL ) tablet 200 mg  200 mg Oral Daily Utomwen, Adesuwa, RPH   200 mg at 09/12/23 0910   [START ON 09/13/2023] lactulose  (CHRONULAC ) 10 GM/15ML solution 30 g  30 g Oral Daily Albustami, Omar M, MD       lidocaine  (LIDODERM ) 5 % 1 patch  1 patch Transdermal Q24H Vita Grip, MD   1 patch at 09/11/23 1620   lip balm (CARMEX) ointment   Topical PRN Dewald, Jonathan B, MD       megestrol (MEGACE) tablet 320 mg  320 mg Oral Daily Albustami, Omar M, MD   320 mg at 09/12/23 0911   melatonin tablet 3 mg  3 mg Oral QHS PRN Utomwen, Adesuwa, RPH   3 mg at 09/10/23 2227   metolazone  (ZAROXOLYN ) tablet 5 mg  5 mg Oral Once McLean, Dalton S, MD       metoprolol  succinate (TOPROL -XL) 24 hr tablet 25 mg  25 mg Oral BID McLean, Dalton S, MD   25 mg at 09/12/23 0735   midodrine  (PROAMATINE ) tablet 15 mg  15 mg Oral TID WC Utomwen, Adesuwa, RPH   15 mg at 09/12/23 1054   multivitamin with minerals tablet 1 tablet  1  tablet Oral BID Harvest Lineman, RPH   1 tablet at 09/12/23 4540   naloxone  (NARCAN ) injection 0.4 mg  0.4 mg Intravenous PRN Howerter, Justin B, DO       ondansetron  (ZOFRAN ) injection 4 mg  4 mg Intravenous Q6H PRN Howerter, Justin B, DO   4 mg at 09/08/23 1103   Oral care mouth rinse  15 mL Mouth Rinse PRN Hadley Leu, NP       pantoprazole  (PROTONIX ) EC tablet 40 mg  40 mg Oral Daily Desai, Nikita S, MD   40 mg at 09/12/23 0908   polyethylene glycol (MIRALAX  / GLYCOLAX ) packet 17 g  17 g Oral Daily Utomwen, Adesuwa, RPH       revefenacin  (YUPELRI ) nebulizer solution 175 mcg  175 mcg Nebulization Daily Hadley Leu, NP   175 mcg at 09/12/23  1132   rifaximin (XIFAXAN) tablet 550 mg  550 mg Oral BID Arlyne Bering, MD   550 mg at 09/12/23 0908   rosuvastatin  (CRESTOR ) tablet 10 mg  10 mg Oral Daily Utomwen, Adesuwa, RPH   10 mg at 09/12/23 0908   senna (SENOKOT) tablet 17.2 mg  2 tablet Oral Daily Utomwen, Adesuwa, RPH   17.2 mg at 09/09/23 1334   sodium chloride  flush (NS) 0.9 % injection 10-40 mL  10-40 mL Intracatheter Q12H Arlina Lair, MD   10 mL at 09/12/23 0913   sodium chloride  flush (NS) 0.9 % injection 10-40 mL  10-40 mL Intracatheter PRN Arlina Lair, MD       spironolactone  (ALDACTONE ) tablet 25 mg  25 mg Oral Daily McLean, Dalton S, MD   25 mg at 09/12/23 0910    PHYSICAL EXAMINATION: ECOG PERFORMANCE STATUS: 4 - Bedbound  Vitals:   09/12/23 1330 09/12/23 1400  BP: 90/71 93/64  Pulse: (!) 110 (!) 118  Resp: 20 17  Temp:    SpO2: 99% 97%   Filed Weights   09/05/23 0500 09/09/23 0500 09/12/23 0445  Weight: 233 lb 11 oz (106 kg) 220 lb 14.4 oz (100.2 kg) 218 lb 14.7 oz (99.3 kg)    GENERAL: + Somnolent SKIN: + Ecchymosis RUE  EYES: normal, conjunctiva are pink and non-injected, sclera clear OROPHARYNX: + Tube feeding NECK: supple, thyroid  normal size, non-tender, without nodularity LYMPH: no palpable lymphadenopathy in the cervical, axillary or inguinal LUNGS: clear to auscultation and percussion with normal breathing effort HEART: regular rate & rhythm and no murmurs and no lower extremity edema ABDOMEN: abdomen soft, non-tender and normal bowel sounds MUSCULOSKELETAL: no cyanosis of digits and no clubbing  PSYCH: + Intermittent confusion NEURO: no focal motor/sensory deficits   LABS: Lab Results  Component Value Date   WBC 7.3 09/12/2023   HGB 13.5 09/12/2023   HCT 43.6 09/12/2023   MCV 97.3 09/12/2023   PLT 38 (L) 09/12/2023    Lab Results  Component Value Date   WBC 7.3 09/12/2023   HGB 13.5 09/12/2023   HCT 43.6 09/12/2023   PLT 38 (L) 09/12/2023   GLUCOSE 195 (H) 09/12/2023    CHOL 124 08/27/2023   TRIG 106 08/27/2023   HDL 29 (L) 08/27/2023   LDLCALC 74 08/27/2023   ALT 17 09/12/2023   AST 59 (H) 09/12/2023   NA 143 09/12/2023   K 2.8 (L) 09/12/2023   CL 100 09/12/2023   CREATININE 1.77 (H) 09/12/2023   BUN 50 (H) 09/12/2023   CO2 36 (H) 09/12/2023   INR 1.5 (H) 08/26/2023   HGBA1C 5.1 08/26/2023  DG Abd Portable 1V Result Date: 09/08/2023 CLINICAL DATA:  NG tube placement EXAM: PORTABLE ABDOMEN - 1 VIEW COMPARISON:  01/18/2015 FINDINGS: NG tube coils in the stomach. Mild gaseous distention of the visualized bowel in the upper abdomen. IMPRESSION: NG tube coils in the stomach. Electronically Signed   By: Janeece Mechanic M.D.   On: 09/08/2023 18:15   DG Chest Port 1 View Result Date: 09/08/2023 CLINICAL DATA:  [id number] Pulmonary edema [id number] EXAM: PORTABLE CHEST - 1 VIEW COMPARISON:  09/07/2023. FINDINGS: Cardiac silhouette is prominent. There is pulmonary interstitial prominence with vascular congestion. No focal consolidation. No pneumothorax or pleural effusion identified. Aorta is calcified. There are median sternotomy wires. Prosthetic aortic valve. Implanted cardiac monitor. Right-sided PICC tip mid SVC. IMPRESSION: Findings suggest CHF. Electronically Signed   By: Sydell Eva M.D.   On: 09/08/2023 08:52   DG Chest Port 1 View Result Date: 09/07/2023 CLINICAL DATA:  Pulmonary edema. EXAM: PORTABLE CHEST 1 VIEW COMPARISON:  One-view chest x-ray 09/03/2023 FINDINGS: The heart is enlarged. Aortic valve replacement is noted. Number cord noted. Right PICC line terminates just above the cavoatrial junction. Lung volumes are low. Increasing interstitial edema and bilateral pleural effusions is noted. Bibasilar airspace opacities likely reflect atelectasis. IMPRESSION: 1. Increasing interstitial edema and bilateral pleural effusions compatible with progressive congestive heart failure. 2. Bibasilar airspace opacities likely reflect atelectasis. Electronically Signed    By: Audree Leas M.D.   On: 09/07/2023 12:00   DG CHEST PORT 1 VIEW Result Date: 09/03/2023 CLINICAL DATA:  Respiratory failure EXAM: PORTABLE CHEST 1 VIEW COMPARISON:  09/02/2023 FINDINGS: Cardiac shadow is enlarged but stable. Postsurgical changes are noted. Right PICC is now seen in satisfactory position. The lungs are well aerated bilaterally. Prominent central vascular congestion is noted. Mild right basilar atelectasis is noted. IMPRESSION: Mild vascular congestion with right basilar atelectasis. Electronically Signed   By: Violeta Grey M.D.   On: 09/03/2023 08:29   DG CHEST PORT 1 VIEW Result Date: 09/02/2023 CLINICAL DATA:  Hypoxia. EXAM: PORTABLE CHEST 1 VIEW COMPARISON:  Chest radiograph dated 08/31/2023 FINDINGS: There is cardiomegaly with vascular congestion. Small bilateral pleural effusions and bibasilar atelectasis or infiltrate, progressed since the prior radiograph. No pneumothorax. Median sternotomy wires. No acute osseous pathology. IMPRESSION: 1. Cardiomegaly with vascular congestion. 2. Small bilateral pleural effusions and bibasilar atelectasis or infiltrate. Electronically Signed   By: Angus Bark M.D.   On: 09/02/2023 11:48   US  EKG SITE RITE Result Date: 09/01/2023 If Site Rite image not attached, placement could not be confirmed due to current cardiac rhythm.  ECHOCARDIOGRAM LIMITED Result Date: 09/01/2023    ECHOCARDIOGRAM LIMITED REPORT   Patient Name:   Whitney [name] Date of Exam: 09/01/2023 Medical Rec #:  [id number]      Height:       63.7 in Accession #:    [id number]     Weight:       244.9 lb Date of Birth:  1955-06-26       BSA:          2.126 m Patient Age:    68 years       BP:           88/56 mmHg Patient Gender: F              HR:           58 bpm. Exam Location:  Inpatient Procedure: Limited Echo, Cardiac Doppler and Color Doppler (Both Spectral and  Color Flow Doppler were utilized during procedure). Indications:   PHTN I27.2  History:        Patient has prior history of Echocardiogram examinations.                Aortic Valve: bioprosthetic valve is present in the aortic                position.  Sonographer:   Hersey Lorenzo RDCS Referring      [phone] [facility] Phys: IMPRESSIONS  1. Left ventricular ejection fraction, by estimation, is >75%. The left ventricle has hyperdynamic function.  2. The right ventricular size is moderately enlarged. There is moderately elevated pulmonary artery systolic pressure.  3. Left atrial size was severely dilated.  4. Severe mitral annular calcification.  5. The aortic valve has been repaired/replaced. There is moderate calcification of the aortic valve. There is moderate thickening of the aortic valve. Aortic valve sclerosis is present, with no evidence of aortic valve stenosis. There is a bioprosthetic  valve present in the aortic position.  6. The inferior vena cava is dilated in size with <50% respiratory variability, suggesting right atrial pressure of 15 mmHg. FINDINGS  Left Ventricle: Left ventricular ejection fraction, by estimation, is >75%. The left ventricle has hyperdynamic function. Right Ventricle: The right ventricular size is moderately enlarged. There is moderately elevated pulmonary artery systolic pressure. The tricuspid regurgitant velocity is 3.02 m/s, and with an assumed right atrial pressure of 15 mmHg, the estimated right  ventricular systolic pressure is 51.5 mmHg. Left Atrium: Left atrial size was severely dilated. Mitral Valve: Severe mitral annular calcification. Tricuspid Valve: The tricuspid valve is normal in structure. Tricuspid valve regurgitation is mild. Aortic Valve: The aortic valve has been repaired/replaced. There is moderate calcification of the aortic valve. There is moderate thickening of the aortic valve. Aortic valve sclerosis is present, with no evidence of aortic valve stenosis. There is a bioprosthetic valve present in the aortic position. Venous: The inferior vena  cava is dilated in size with less than 50% respiratory variability, suggesting right atrial pressure of 15 mmHg. LEFT VENTRICLE PLAX 2D LVIDd:         5.20 cm LVIDs:         2.10 cm LV PW:         1.00 cm LV IVS:        1.00 cm  RIGHT VENTRICLE             IVC RV S prime:     10.60 cm/s  IVC diam: 2.40 cm TAPSE (M-mode): 2.5 cm LEFT ATRIUM         Index       RIGHT ATRIUM           Index LA diam:    5.10 cm 2.40 cm/m  RA Area:     19.20 cm                                 RA Volume:   57.00 ml  26.81 ml/m  AORTIC VALVE LVOT Vmax:   116.00 cm/s LVOT Vmean:  77.600 cm/s LVOT VTI:    0.366 m MITRAL VALVE                TRICUSPID VALVE MV Area (PHT): 1.94 cm     TR Peak grad:   36.5 mmHg MV Decel Time: 392 msec     TR Vmax:  302.00 cm/s MV E velocity: 157.00 cm/s MV A velocity: 106.00 cm/s  SHUNTS MV E/A ratio:  1.48         Systemic VTI: 0.37 m Dorothye Gathers MD Electronically signed by Dorothye Gathers MD Signature Date/Time: 09/01/2023/2:15:19 PM    Final    DG CHEST PORT 1 VIEW Result Date: 08/31/2023 CLINICAL DATA:  Dyspnea EXAM: PORTABLE CHEST 1 VIEW COMPARISON:  08/25/2023 FINDINGS: Pulmonary insufflation is normal and symmetric. Mild left basilar atelectasis or infiltrate. No pneumothorax or pleural effusion. Status post aortic valve replacement. Stable cardiomegaly. Implanted loop recorder again noted. Central pulmonary arteries are enlarged. No overt pulmonary edema. No acute bone abnormality. IMPRESSION: 1. Mild left basilar atelectasis or infiltrate. 2. Stable cardiomegaly. Electronically Signed   By: Worthy Heads M.D.   On: 08/31/2023 21:28   ECHOCARDIOGRAM COMPLETE BUBBLE STUDY Result Date: 08/27/2023    ECHOCARDIOGRAM REPORT   Patient Name:   Whitney [name] Date of Exam: 08/27/2023 Medical Rec #:  [id number]      Height:       63.7 in Accession #:    [id number]     Weight:       249.8 lb Date of Birth:  1955-08-10       BSA:          2.143 m Patient Age:    68 years       BP:           145/86 mmHg  Patient Gender: F              HR:           71 bpm. Exam Location:  Inpatient Procedure: 2D Echo, Cardiac Doppler, Color Doppler and Saline Contrast Bubble            Study (Both Spectral and Color Flow Doppler were utilized during            procedure). Indications:    Stroke  History:        Patient has prior history of Echocardiogram examinations, most                 recent 03/14/2023. COPD, Signs/Symptoms:Altered Mental Status and                 Dyspnea; Risk Factors:Sleep Apnea, Diabetes and Dyslipidemia.  Sonographer:    Aldon Ambrosia Referring Phys: [id number] PROSPER M AMPONSAH IMPRESSIONS  1. Left ventricular ejection fraction, by estimation, is >75%. The left ventricle has hyperdynamic function. The left ventricle has no regional wall motion abnormalities. Left ventricular diastolic function could not be evaluated due to mitral annular calcification (moderate or greater) but hemodynamics suggestive of Grade III diastolic dysfunction. Elevated left atrial pressure. The E/e' is 17.  2. Right ventricular systolic function is normal. The right ventricular size is mildly enlarged. There is severely elevated pulmonary artery systolic pressure. The estimated right ventricular systolic pressure is 73.1 mmHg.  3. Left atrial size was severely dilated.  4. Severe mitral subvalvular calcification.  5. The mitral valve is degenerative. Moderate mitral valve regurgitation. Mild mitral stenosis. The mean mitral valve gradient is 5.0 mmHg with average heart rate of 70 bpm. Severe mitral annular calcification.  6. Tricuspid valve regurgitation is severe.  7. S/P 21 mm Edwards Inspiris Resilia valve present in the aortic position. Procedure Date: 12/05/17. No valvular regurgitation. No valvular stenosis (peak velocity 2.39m/s, MG 13.50mmHG, DI 0.47, EOA 1.33, triangular jet contour, AT ). No PVL.  8. The inferior vena  cava is dilated in size with <50% respiratory variability, suggesting right atrial pressure of 15 mmHg.   9. Agitated saline contrast bubble study was negative, with no evidence of any interatrial shunt. Conclusion(s)/Recommendation(s): No intracardiac source of embolism detected on this transthoracic study. Consider a transesophageal echocardiogram to exclude cardiac source of embolism if clinically indicated. FINDINGS  Left Ventricle: Left ventricular ejection fraction, by estimation, is >75%. The left ventricle has hyperdynamic function. The left ventricle has no regional wall motion abnormalities. The left ventricular internal cavity size was normal in size. There is borderline left ventricular hypertrophy. Left ventricular diastolic function could not be evaluated due to mitral annular calcification (moderate or greater) but hemodynamics suggestive of Grade III diastolic dysfunction. Elevated left atrial pressure. The E/e' is 17. Right Ventricle: The right ventricular size is mildly enlarged. Right vetricular wall thickness was not well visualized. Right ventricular systolic function is normal. There is severely elevated pulmonary artery systolic pressure. The tricuspid regurgitant velocity is 3.81 m/s, and with an assumed right atrial pressure of 15 mmHg, the estimated right ventricular systolic pressure is 73.1 mmHg. Left Atrium: Left atrial size was severely dilated. Right Atrium: Right atrial size was normal in size. Pericardium: There is no evidence of pericardial effusion. Mitral Valve: The mitral valve is degenerative in appearance. Severe mitral annular calcification. Severe mitral subvalvular calcification. Moderate mitral valve regurgitation. Mild mitral valve stenosis. MV peak gradient, 15.2 mmHg. The mean mitral valve gradient is 5.0 mmHg with average heart rate of 70 bpm. Tricuspid Valve: The tricuspid valve is normal in structure. Tricuspid valve regurgitation is severe. No evidence of tricuspid stenosis. Aortic Valve: S/P 21 mm Edwards Inspiris Resilia valve present in the aortic position. Procedure  Date: 12/05/17. No valvular regurgitation. No valvular stenosis (peak velocity 2.17m/s, MG 13.59mmHG, DI 0.47, EOA 1.33, triangular jet contour, AT ). No PVL. Aortic valve mean gradient measures 13.8 mmHg. Aortic valve peak gradient measures 24.6 mmHg. Aortic valve area, by VTI measures 1.33 cm. Pulmonic Valve: The pulmonic valve was grossly normal. Pulmonic valve regurgitation is mild. No evidence of pulmonic stenosis. Aorta: The aortic root and ascending aorta are structurally normal, with no evidence of dilitation. Venous: The inferior vena cava is dilated in size with less than 50% respiratory variability, suggesting right atrial pressure of 15 mmHg. IAS/Shunts: The atrial septum is grossly normal. Agitated saline contrast was given intravenously to evaluate for intracardiac shunting. Agitated saline contrast bubble study was negative, with no evidence of any interatrial shunt.  LEFT VENTRICLE PLAX 2D LVIDd:         4.20 cm      Diastology LVIDs:         2.80 cm      LV e' medial:    7.18 cm/s LV PW:         1.00 cm      LV E/e' medial:  20.8 LV IVS:        1.10 cm      LV e' lateral:   10.60 cm/s LVOT diam:     1.90 cm      LV E/e' lateral: 14.1 LV SV:         73 LV SV Index:   34 LVOT Area:     2.84 cm  LV Volumes (MOD) LV vol d, MOD A2C: 163.0 ml LV vol d, MOD A4C: 114.0 ml LV vol s, MOD A2C: 50.4 ml LV vol s, MOD A4C: 45.7 ml LV SV MOD A2C:  112.6 ml LV SV MOD A4C:     114.0 ml LV SV MOD BP:      88.2 ml RIGHT VENTRICLE             IVC RV Basal diam:  4.70 cm     IVC diam: 2.10 cm RV Mid diam:    3.70 cm RV S prime:     10.00 cm/s TAPSE (M-mode): 1.3 cm LEFT ATRIUM              Index        RIGHT ATRIUM           Index LA diam:        4.50 cm  2.10 cm/m   RA Area:     17.70 cm LA Vol (A2C):   133.0 ml 62.05 ml/m  RA Volume:   48.80 ml  22.77 ml/m LA Vol (A4C):   112.0 ml 52.25 ml/m LA Biplane Vol: 128.0 ml 59.72 ml/m  AORTIC VALVE                     PULMONIC VALVE AV Area (Vmax):    1.25 cm       PV Vmax:          1.12 m/s AV Area (Vmean):   1.37 cm      PV Peak grad:     5.1 mmHg AV Area (VTI):     1.33 cm      PR End Diast Vel: 10.50 msec AV Vmax:           248.00 cm/s AV Vmean:          170.000 cm/s AV VTI:            0.552 m AV Peak Grad:      24.6 mmHg AV Mean Grad:      13.8 mmHg LVOT Vmax:         109.00 cm/s LVOT Vmean:        81.900 cm/s LVOT VTI:          0.258 m LVOT/AV VTI ratio: 0.47  AORTA Ao Root diam: 3.60 cm Ao Asc diam:  3.60 cm MITRAL VALVE                TRICUSPID VALVE MV Area (PHT): 3.95 cm     TR Peak grad:   58.1 mmHg MV Area VTI:   1.52 cm     TR Vmax:        381.00 cm/s MV Peak grad:  15.2 mmHg MV Mean grad:  5.0 mmHg     SHUNTS MV Vmax:       1.95 m/s     Systemic VTI:  0.26 m MV Vmean:      104.0 cm/s   Systemic Diam: 1.90 cm MV Decel Time: 192 msec MR Peak grad: 145.4 mmHg MR Mean grad: 86.0 mmHg MR Vmax:      603.00 cm/s MR Vmean:     429.0 cm/s MV E velocity: 149.00 cm/s MV A velocity: 64.90 cm/s MV E/A ratio:  2.30 Sunit Tolia Electronically signed by Olinda Bertrand Signature Date/Time: 08/27/2023/9:07:17 PM    Final    Overnight EEG with video Result Date: 08/27/2023 Arleene Lack, MD     08/28/2023 11:05 AM Patient Name: Whitney [name] MRN: [id number] Epilepsy Attending: Arleene Lack Referring Physician/Provider: Eleni Griffin, MD Duration: 08/27/2023 6440 to 08/28/2023 0353 Patient history: 67 y.o. female with a history of  Sjogren syndrome, diabetes, fibromyalgia, depression, GERD, aortic valve replacement, A-fib on Eliquis , TIA, NASH cirrhosis, pulmonary hypertension, thrombocytopenia and home O2 use and recent dog bite who presented with altered mental status.  EEG to evaluate for seizure Level of alertness: Awake, asleep AEDs during EEG study: None Technical aspects: This EEG study was done with scalp electrodes positioned according to the 10-20 International system of electrode placement. Electrical activity was reviewed with band pass filter of 1-70Hz ,  sensitivity of 7 uV/mm, display speed of 2mm/sec with a 60Hz  notched filter applied as appropriate. EEG data were recorded continuously and digitally stored.  Video monitoring was available and reviewed as appropriate. Description: No clear posterior dominant rhythm was seen. Sleep was characterized by vertex waves, maximal frontocentral region. EEG showed continuous generalized predominantly 5 to 7 Hz theta slowing admixed with intermittent 2-3hz  delta slowing. Hyperventilation and photic stimulation were not performed.    ABNORMALITY - Continuous slow, generalized  IMPRESSION: This study is suggestive of moderate diffuse encephalopathy. No seizures or epileptiform discharges were seen throughout the recording.  Arleene Lack   EEG adult Result Date: 08/26/2023 Arleene Lack, MD     08/26/2023 12:30 PM Patient Name: Whitney [name] MRN: [id number] Epilepsy Attending: Arleene Lack Referring Physician/Provider: Colon Dear, NP Date: 08/26/2023 Duration: 23.39 mins Patient history: 68yo F with ams. EEG to evaluate for seizure Level of alertness: Awake, asleep AEDs during EEG study: None Technical aspects: This EEG study was done with scalp electrodes positioned according to the 10-20 International system of electrode placement. Electrical activity was reviewed with band pass filter of 1-70Hz , sensitivity of 7 uV/mm, display speed of 72mm/sec with a 60Hz  notched filter applied as appropriate. EEG data were recorded continuously and digitally stored.  Video monitoring was available and reviewed as appropriate. Description: The posterior dominant rhythm consists of 7 Hz activity of moderate voltage (25-35 uV) seen predominantly in posterior head regions, symmetric and reactive to eye opening and eye closing. Sleep was characterized by sleep spindles (12 to 14 Hz), maximal frontocentral region. EEG showed continuous generalized 6 to 7 Hz theta slowing. Hyperventilation and photic stimulation were not  performed.   ABNORMALITY - Continuous slow, generalized IMPRESSION: This study is suggestive of mild to moderate diffuse encephalopathy. No seizures or epileptiform discharges were seen throughout the recording. Priyanka Suzanne Erps   CT ANGIO HEAD NECK W WO CM Result Date: 08/26/2023 CLINICAL DATA:  68 year old female with code stroke presentation. Small right superior frontal subcortical white matter infarct on MRI this morning. EXAM: CT ANGIOGRAPHY HEAD AND NECK WITH AND WITHOUT CONTRAST TECHNIQUE: Multidetector CT imaging of the head and neck was performed using the standard protocol during bolus administration of intravenous contrast. Multiplanar CT image reconstructions and MIPs were obtained to evaluate the vascular anatomy. Carotid stenosis measurements (when applicable) are obtained utilizing NASCET criteria, using the distal internal carotid diameter as the denominator. RADIATION DOSE REDUCTION: This exam was performed according to the departmental dose-optimization program which includes automated exposure control, adjustment of the mA and/or kV according to patient size and/or use of iterative reconstruction technique. CONTRAST:  75mL OMNIPAQUE  IOHEXOL  350 MG/ML SOLN COMPARISON:  Brain MRI today reported separately. CTA head and neck 04/14/2022. FINDINGS: CTA NECK Skeleton: Sternotomy.  No acute osseous abnormality identified. Upper chest: CABG.  Upper lung atelectasis. Other neck: Retropharyngeal course of the carotids. Redemonstrated asymmetrically atrophied, heterogeneously calcified parotid glands and submandibular glands. Other nonvascular neck soft tissue spaces are within normal limits. Aortic  arch: Calcified aortic atherosclerosis. 3 vessel arch configuration. Right carotid system: Tortuous brachiocephalic artery and right CCA with no significant plaque or stenosis. Mild calcified plaque at the right ICA origin. Tortuous right ICA without stenosis. Left carotid system: Similar tortuosity and mild  calcified plaque without stenosis. Vertebral arteries: Tortuous proximal right subclavian origin with minimal plaque. Normal right vertebral artery origin. Mildly tortuous and non dominant right vertebral artery is patent to the skull base with no significant plaque or stenosis. Calcified proximal left subclavian artery plaque without stenosis. Normal left vertebral artery origin. Tortuous left V1 segment. Left vertebral artery is mildly dominant and tortuous to the skull base with no significant plaque or stenosis. CTA HEAD Posterior circulation: Distal vertebral arteries, vertebrobasilar junction, left PICA origin and dominant appearing right AICA are patent with no significant plaque or stenosis. Patent basilar artery without stenosis. Patent SCA origins. Fetal type bilateral PCA origins again noted. Bilateral PCA branches appear stable and within normal limits. Anterior circulation: Both ICA siphons are patent. Mild siphon tortuosity. Mild left siphon calcified plaque without stenosis. Similar right siphon tortuosity and mild calcified plaque without stenosis. Normal posterior communicating artery origins. Patent carotid termini. Normal MCA and ACA origins. Anterior communicating artery and bilateral ACA branches are within normal limits. MCA M1 segments, bi/trifurcations are patent without stenosis. Bilateral MCA branches are stable and within normal limits. Venous sinuses: Patent. Anatomic variants: Fetal type PCA origins. Mildly dominant left vertebral artery. Review of the MIP images confirms the above findings IMPRESSION: 1. Negative large vessel occlusion. Generalized arterial tortuosity in the head and neck with mild for age atherosclerosis and no hemodynamically significant arterial stenosis. 2.  Aortic Atherosclerosis (ICD10-I70.0).  CABG. 3. Chronic salivary gland atrophy, sialolithiasis. Electronically Signed   By: Marlise Simpers M.D.   On: 08/26/2023 10:04   MR BRAIN WO CONTRAST Result Date:  08/26/2023 CLINICAL DATA:  Neuro deficit, acute, stroke suspected. Altered mental status. EXAM: MRI HEAD WITHOUT CONTRAST TECHNIQUE: Multiplanar, multiecho pulse sequences of the brain and surrounding structures were obtained without intravenous contrast. COMPARISON:  Head CT 08/25/2023 and MRI 03/17/2023 FINDINGS: The study is intermittently up to moderately motion degraded. Brain: There is a 1 cm acute infarct in the subcortical white matter of the posterior right frontal lobe. A few chronic cerebral microhemorrhages are nonspecific. Small T2 hyperintensities in the cerebral white matter nonspecific but compatible with mild chronic small vessel ischemic disease. There are small chronic infarcts involving the left caudate nucleus and cerebellum. A partially empty sella is unchanged. Cerebral volume is within normal limits for age. The ventricles are normal in size. No mass, midline shift, or extra-axial fluid collection is identified. Vascular: Major intracranial vascular flow voids are preserved. Skull and upper cervical spine: Unremarkable bone marrow signal. Sinuses/Orbits: Unremarkable orbits. Mild mucosal thickening in the paranasal sinuses. Trace right mastoid fluid. Other: None. IMPRESSION: 1. Small acute posterior right frontal white matter infarct. 2. Mild chronic small vessel ischemic disease. Electronically Signed   By: Aundra Lee M.D.   On: 08/26/2023 07:26   DG Chest Portable 1 View Result Date: 08/25/2023 CLINICAL DATA:  Altered mental status. EXAM: PORTABLE CHEST 1 VIEW COMPARISON:  Radiograph 08/24/2023 FINDINGS: Stable cardiomegaly. Loop recorder. AVR. Sternotomy. Aortic atherosclerotic calcification. Pulmonary vascular congestion. Prominent central pulmonary arteries. No focal consolidation, pleural effusion, or pneumothorax. IMPRESSION: Cardiomegaly with pulmonary vascular congestion. Electronically Signed   By: Rozell Cornet M.D.   On: 08/25/2023 19:36   CT HEAD CODE STROKE WO  CONTRAST Result Date:  08/25/2023 CLINICAL DATA:  Code stroke.  Dysphasia EXAM: CT HEAD WITHOUT CONTRAST TECHNIQUE: Contiguous axial images were obtained from the base of the skull through the vertex without intravenous contrast. RADIATION DOSE REDUCTION: This exam was performed according to the departmental dose-optimization program which includes automated exposure control, adjustment of the mA and/or kV according to patient size and/or use of iterative reconstruction technique. COMPARISON:  CT head without contrast 08/24/2023. MRI of the head without contrast 03/17/2023 FINDINGS: Brain: No acute infarct, hemorrhage, or mass lesion is present. Mild periventricular white matter changes are stable. A remote lacunar infarct is again noted in the left caudate head. The deep brain nuclei are otherwise within normal limits. No acute or focal cortical abnormalities are present. The ventricles are of normal size. No significant extraaxial fluid collection is present. An enlarged relatively empty sella is present. Midline structures are otherwise unremarkable. Vascular: Atherosclerotic calcifications are present within the cavernous internal carotid arteries bilaterally. No hyperdense vessel is present. Skull: Calvarium is intact. No focal lytic or blastic lesions are present. No significant extracranial soft tissue lesion is present. Sinuses/Orbits: The paranasal sinuses and mastoid air cells are clear. The globes and orbits are within normal limits. Other: Diffuse fatty infiltration of the parotid glands is again noted bilaterally. Multiple punctate calcifications are present in both parotid glands. ASPECTS [facility] Stroke Program Early CT Score) - Ganglionic level infarction (caudate, lentiform nuclei, internal capsule, insula, M1-M3 cortex): 7/7 - Supraganglionic infarction (M4-M6 cortex): 3/3 Total score (0-10 with 10 being normal): 10/10 IMPRESSION: 1. No acute intracranial abnormality or significant interval change.  2. Stable mild periventricular white matter disease. This likely reflects the sequela of chronic microvascular ischemia. 3. Remote lacunar infarct of the left caudate head. 4. Aspects is 10/10. 5. Diffuse fatty infiltration of the parotid glands bilaterally with multiple punctate calcifications. This suggests Sjogren disease The above was relayed via text pager to Dr. Cleone Dad on 08/25/2023 at 17:48 . Electronically Signed   By: Audree Leas M.D.   On: 08/25/2023 17:48   DG Chest 2 View Result Date: 08/24/2023 CLINICAL DATA:  Chest pain. EXAM: CHEST - 2 VIEW COMPARISON:  10/25/2022. FINDINGS: Heart is enlarged and the mediastinal contour is within normal limits. Left atrial appendage clip and prosthetic valve are noted. Lung volumes are low resulting in vascular crowding. No consolidation, effusion, or pneumothorax. Sternotomy wires are present over the midline. Degenerative changes are seen in the thoracic spine. IMPRESSION: No active cardiopulmonary disease. Electronically Signed   By: Wyvonnia Heimlich M.D.   On: 08/24/2023 16:03   CT Head Wo Contrast Result Date: 08/24/2023 CLINICAL DATA:  Mental status change, unknown cause. EXAM: CT HEAD WITHOUT CONTRAST TECHNIQUE: Contiguous axial images were obtained from the base of the skull through the vertex without intravenous contrast. RADIATION DOSE REDUCTION: This exam was performed according to the departmental dose-optimization program which includes automated exposure control, adjustment of the mA and/or kV according to patient size and/or use of iterative reconstruction technique. COMPARISON:  03/17/2023. FINDINGS: Brain: No acute intracranial hemorrhage, midline shift or mass effect. No extra-axial fluid collection. Mild periventricular white matter hypodensities are present bilaterally. No hydrocephalus. Vascular: No hyperdense vessel or unexpected calcification. Skull: Normal. Negative for fracture or focal lesion. Sinuses/Orbits: There is partial  opacification of the mastoid air cells bilaterally. Mucosal thickening is present in the maxillary sinuses. No acute orbital abnormality. Other: None. IMPRESSION: 1. No acute intracranial process. 2. Mild chronic microvascular ischemic changes. Electronically Signed   By: Wyvonnia Heimlich  M.D.   On: 08/24/2023 15:59   DG Forearm Left Result Date: 08/20/2023 CLINICAL DATA:  Dog bite left forearm EXAM: LEFT FOREARM - 2 VIEW COMPARISON:  None Available. FINDINGS: Frontal and lateral views of the left forearm are obtained on 3 images. There is marked soft tissue edema throughout the dorsum of the forearm, greatest distally. No fracture or radiopaque foreign body. Alignment is anatomic. IMPRESSION: 1. Diffuse soft tissue swelling. No fracture or radiopaque foreign body. Electronically Signed   By: Bobbye Burrow M.D.   On: 08/20/2023 08:38     The total time spent in the appointment was 55 minutes encounter with patients including review of chart and various tests results, discussions about plan of care and coordination of care plan   All questions were answered. The patient knows to call the clinic with any problems, questions or concerns. No barriers to learning was detected.  Thank you for the courtesy of this consultation, Jacqualin Mate, NP  5/8/20252:59 PM

## 2023-09-12 NOTE — Progress Notes (Signed)
 NAME:  BENA QUIRAM, MRN:  [id number], DOB:  06-Feb-1956, LOS: 18 ADMISSION DATE:  08/25/2023, CONSULTATION DATE:  08/31/2023 REFERRING MD:  Sandria Cruise - TRH, CHIEF COMPLAINT: Hypercapnic respiratory failure  History of Present Illness:  68 year old woman with a past medical history significant for NASH cirrhosis, HTN, HLD, pulmonary hypertension, Sjogren's syndrome, thrombocytopenia in the setting of cirrhosis, type 2 diabetes and anemia who presented to the ED at [facility] 4/20 for complaints of altered mental status. Workup thus far has indicated that AMS is related to hypercapnic respiratory failure for whish PCCM has been called to assist in management.   Pertinent Medical History:  NASH cirrhosis, HTN, HLD, pulmonary hypertension, Sjogren's syndrome, thrombocytopenia in the setting of cirrhosis, type 2 diabetes and anemia  Significant Hospital Events: Including procedures, antibiotic start and stop dates in addition to other pertinent events   4/20 Admitted with altered mental status being this blood gas with PCO2 69.4 4/26 PCCM consulted ABG with pH 7.29 and PCO261, moved to the intensive care.  Placed on NIPPV had not received diuretics since admission 4/27 Did have a hypoglycemic event requiring dextrose  replacement BNP was 1322 echo cardiogram review shows hyperdynamic LV with grade 3 diastolic dysfunction and elevated LAP bioprosthesis aortic valve in place there was severe TR, severe mitral valve disease and severe PAH with a dilated IVC.,  Limited follow-up echocardiogram continues to show hyperdynamic EF greater than 75% RV moderately enlarged with moderately elevated pulmonary artery systolic pressures, left atrial size dilated estimated right atrial pressure 15 mmHg, 4/28 PICC placed, norepi and later vasopressin  started. Ad HF added empiric ceftriaxone  4/29 still w/ some hypercarbia, stopped dex. Cont lasix  gtt and vasopressor support. Volume status much improved. CXR improved. Delirium  an issue. PT ordered. OOB ordered. Resumed Neurontin . 5/1 Remains on NE, Vaso. weaning pressors. Started low dose spironolactone , considering TEE/DCCV prior to dc, seen by rehab team who felt BIPAP on intermittent basis would be barrier but if only needed at HS could be considered candidate  5/2 NE stopped. Changed goal to MAP 65 but advanced HF noting "Her PAH from portopulmonary hypertension may be contributing mildly, but the main driver of her pathology is her hepatic failure" noted that should she fail pressor wean palliative consult should be considered. Lasix  and spiro stopped 5/3 Stayed off norepi. MAPs >65 Still on vasopressin . Scr up a little. CVP 14-15.  Ammonia level was 80, started lactulose .  Got a dose of Lasix . 5/4 CXR improved s/p diuretics, a little more awake.  Vasopressin  off 5/6 Levophed  off. Afib RVR later in the day with rates up to 130s. 5/8 HR better controlled stable for transfer out of the ICU  Interim History / Subjective:  Remains stable on [place] Oriented, not on bipap overnight HF better controlled with oral bb, alert and oriented  C/o pain with foley  Objective:   Blood pressure 97/81, pulse (!) 106, temperature 97.8 F (36.6 C), temperature source Oral, resp. rate (!) 21, height 5' 3.75" (1.619 m), weight 99.3 kg, SpO2 98%. CVP:  [12 mmHg-22 mmHg] 21 mmHg  Vent Mode: BIPAP FiO2 (%):  [40 %] 40 % Set Rate:  [18 bmp] 18 bmp PEEP:  [5 cmH20] 5 cmH20 Pressure Support:  [15 cmH20] 15 cmH20   Intake/Output Summary (Last 24 hours) at 09/12/2023 0855 Last data filed at 09/12/2023 0700 Gross per 24 hour  Intake 2852.88 ml  Output 1880 ml  Net 972.88 ml   Filed Weights   09/05/23 0500 09/09/23 0500  09/12/23 0445  Weight: 106 kg 100.2 kg 99.3 kg   Physical Examination: General: Chronically ill-appearing older woman in NAD. Pleasant and conversant. HEENT: [place]/AT, anicteric sclera, PERRL, moist mucous membranes. Neuro: alert and oriented and moving all extremities   CV: Irregularly irregular rhythm, rate 110s, no m/g/r. PULM: Breathing even and unlabored on 3LNC. Lung fields diminished at bilateral bases. GI: Soft, nontender, nondistended. Normoactive bowel sounds. Extremities: No edema    Ancillary Tests Personally Reviewed:     Assessment & Plan:   Acute-on-chronic hypoxic and hypercapnic respiratory failure with associated metabolic encephalopathy, secondary to volume overload, superimposed on underlying COPD and OSA. - Supplemental O2 support for sat > 90% (remains on 3LNC) - BiPAP at bedtime qhs - Bronchodilators (Brovana /Yupelri , albuterol  PRN) - Wean FiO2 for O2 sat > 90% - Pulmonary hygiene (IS, encourage OOB/mobility) - Diuresis per AHF team, continue lasix  gtt    Acute-on-chronic HFpEF (grade III diastolic HF), high-output Portopulmonary HTN, holding tadalafil  S/p AVR (bioprosthetic) w/ on-going valvular disease (gradient high for AVR) - AHF following, appreciate recommendations - Goal MAP > 60 - Continue midodrine  TID - RHC considered, deferred in favor of medical therapy in the setting of multiple comorbidities/risk vs. Benefit and ultimately no significant change to plan of care - Ongoing GOC discussions, appreciate PMT involvement  Paroxysmal AF, now with RVR HFpEF, hough output HF Persistent Afib with RVR despite addition of BB. Avoiding amiodarone  in the setting of liver disease. - Optimize electrolyes (K > 4, Mg > 2) - Cardiac monitoring - Eliquis  for [facility] -seen by cardiology, started on lasix  gtt and increased to 20mg  with diamox  today -holding tildanafil -RHC was discussed with family, per HF team continue  - May eventually be a candidate for cardioversion; at this time, with multiple comorbidities/critical illness, risk > benefit  Acute metabolic encephalopathy, feel this is more hepatic encephalopathy driven as well as degree of ICU delirium  Improved with lactulose .  - Continue low-dose gabapentin  - Continue  lactulose , Xifaxin - BiPAP at night for hypercarbia - Correct metabolic derangements - Ensure normal sleep/wake cycle - Mobilize/OOB  AKI, likely in the setting of HF Stable, 2L UOP yesterday - Trend BMP - Replete electrolytes as indicated - Monitor I&Os, diuresis per AHF - Avoid nephrotoxic agents as able - Ensure adequate renal perfusion (goal MAP > 60)  NASH cirrhosis Follows at [facility]. Last EGD 2021 with grade 2 EV. - Trend LFTs - lasix  gtt - Holding home nadolol  - Trending ammonia, down-trending - Continue lactulose /Xifaxin, decrease lactulose  today as had 7 stools yesterday - Consider Zn supplementation   Hyperglycemia Had some mild hypoglycemia in the a.m. of 5/4 - CBGs Q4H - Goal CBG 140-180 - SSI held for episodes of hypoglycemia, add back as indicated  Thrombocytopenia likely secondary to underlying cirrhosis, but platelets down-trending to 30's -no signs of active bleeding, but given trend will consult hematology - Transfuse for Plt < 20K, invasive procedures or hemodynamically significant bleeding  H/o Sjogren's syndrome - Continue Plaquenil   At risk for malnutrition, multifactorial in the setting of NASH cirrhosis, critical illness - RD/Nutrition consulted, appreciate recs - Cortrak placement 5/7 - Begin supplemental TF - Calorie count  GOC - FULL CODE - Given progressive and compounding diagnoses of high-output HF, pHTN, NASH cirrhosis, discussion with patient/family re: long term outcomes warranted - Barrier to CIR at this point remains multifactorial (weakness, confusion, HR); if we can better control HR and mental status improves agree she would be a candidate; if this is  not possible would discuss with patient/family re: her wishes and goals for QOL and potentially work to get her home with services, potentially hospice if it is determined there's no good path forward for Bee - Appreciate PMT assistance (following)  Best Practice (right click and  "Reselect all SmartList Selections" daily)   Diet/type: Regular consistency (see orders) DVT prophylaxis: SCDs Start: 08/25/23 1931 apixaban  (ELIQUIS ) tablet 5 mg   Pressure ulcers present: N/A GI prophylaxis: N/A Lines: Central line Foley:  Yes, and it is no longer needed and removal ordered  Code Status:  full code Last date of multidisciplinary goals of care discussion [5/8- son updated at the bedside]  Signature:   Patt Boozer Doc Mandala, PA-C [facility] 09/12/23 8:55 AM  Please see [email or web address] for pager details.  From 7A-7P if no response, please call [phone] After hours, please call ELink [phone]

## 2023-09-12 NOTE — Progress Notes (Signed)
 Patient yelling, wanting bipap removed. PT educated. Still request to remove it. Switched to nasal cannula.

## 2023-09-13 ENCOUNTER — Ambulatory Visit: Admitting: Internal Medicine

## 2023-09-13 DIAGNOSIS — N179 Acute kidney failure, unspecified: Secondary | ICD-10-CM

## 2023-09-13 DIAGNOSIS — I272 Pulmonary hypertension, unspecified: Secondary | ICD-10-CM | POA: Diagnosis not present

## 2023-09-13 DIAGNOSIS — R638 Other symptoms and signs concerning food and fluid intake: Secondary | ICD-10-CM

## 2023-09-13 DIAGNOSIS — E876 Hypokalemia: Secondary | ICD-10-CM | POA: Insufficient documentation

## 2023-09-13 DIAGNOSIS — J9621 Acute and chronic respiratory failure with hypoxia: Secondary | ICD-10-CM

## 2023-09-13 DIAGNOSIS — E870 Hyperosmolality and hypernatremia: Secondary | ICD-10-CM

## 2023-09-13 DIAGNOSIS — K7682 Hepatic encephalopathy: Secondary | ICD-10-CM | POA: Diagnosis not present

## 2023-09-13 DIAGNOSIS — I5033 Acute on chronic diastolic (congestive) heart failure: Secondary | ICD-10-CM | POA: Diagnosis not present

## 2023-09-13 LAB — BASIC METABOLIC PANEL WITH GFR
Anion gap: 9 (ref 5–15)
Anion gap: 9 (ref 5–15)
BUN: 56 mg/dL — ABNORMAL HIGH (ref 8–23)
BUN: 54 mg/dL — ABNORMAL HIGH (ref 8–23)
CO2: 37 mmol/L — ABNORMAL HIGH (ref 22–32)
CO2: 39 mmol/L — ABNORMAL HIGH (ref 22–32)
Calcium: 9 mg/dL (ref 8.9–10.3)
Calcium: 8.9 mg/dL (ref 8.9–10.3)
Chloride: 94 mmol/L — ABNORMAL LOW (ref 98–111)
Chloride: 93 mmol/L — ABNORMAL LOW (ref 98–111)
Creatinine, Ser: 1.92 mg/dL — ABNORMAL HIGH (ref 0.44–1.00)
Creatinine, Ser: 2.03 mg/dL — ABNORMAL HIGH (ref 0.44–1.00)
GFR, Estimated: 28 mL/min — ABNORMAL LOW (ref >60)
GFR, Estimated: 26 mL/min — ABNORMAL LOW (ref >60)
Glucose, Bld: 202 mg/dL — ABNORMAL HIGH (ref 70–99)
Glucose, Bld: 188 mg/dL — ABNORMAL HIGH (ref 70–99)
Potassium: 3.2 mmol/L — ABNORMAL LOW (ref 3.5–5.1)
Potassium: 3.3 mmol/L — ABNORMAL LOW (ref 3.5–5.1)
Sodium: 140 mmol/L (ref 135–145)
Sodium: 141 mmol/L (ref 135–145)

## 2023-09-13 LAB — MAGNESIUM: Magnesium: 2.2 mg/dL (ref 1.7–2.4)

## 2023-09-13 LAB — GLUCOSE, CAPILLARY
Glucose-Capillary: 164 mg/dL — ABNORMAL HIGH (ref 70–99)
Glucose-Capillary: 138 mg/dL — ABNORMAL HIGH (ref 70–99)
Glucose-Capillary: 121 mg/dL — ABNORMAL HIGH (ref 70–99)
Glucose-Capillary: 193 mg/dL — ABNORMAL HIGH (ref 70–99)

## 2023-09-13 LAB — AMMONIA: Ammonia: 185 umol/L — ABNORMAL HIGH (ref 9–35)

## 2023-09-13 MED ORDER — SENNA 8.6 MG PO TABS
2.0000 | ORAL_TABLET | Freq: Every day | ORAL | Status: DC
Start: 2023-09-14 — End: 2023-09-14
  Administered 2023-09-14: 17.2 mg
  Filled 2023-09-13: qty 2

## 2023-09-13 MED ORDER — METOPROLOL TARTRATE 25 MG PO TABS
25.0000 mg | ORAL_TABLET | Freq: Two times a day (BID) | ORAL | Status: DC
  Filled 2023-09-13 (×2): qty 1

## 2023-09-13 MED ORDER — ACETAMINOPHEN 650 MG RE SUPP: 650.0000 mg | Freq: Four times a day (QID) | RECTAL | Status: DC | PRN

## 2023-09-13 MED ORDER — HYDROXYCHLOROQUINE SULFATE 200 MG PO TABS
200.0000 mg | ORAL_TABLET | Freq: Every day | ORAL | Status: DC
  Administered 2023-09-14: 200 mg
  Filled 2023-09-13: qty 1

## 2023-09-13 MED ORDER — ACETAZOLAMIDE SODIUM 500 MG IJ SOLR
500.0000 mg | Freq: Once | INTRAMUSCULAR | Status: AC
  Administered 2023-09-13: 500 mg via INTRAVENOUS
  Filled 2023-09-13: qty 500

## 2023-09-13 MED ORDER — POTASSIUM CHLORIDE CRYS ER 20 MEQ PO TBCR: 40.0000 meq | EXTENDED_RELEASE_TABLET | Freq: Once | ORAL | Status: DC

## 2023-09-13 MED ORDER — APIXABAN 2.5 MG PO TABS
2.5000 mg | ORAL_TABLET | Freq: Two times a day (BID) | ORAL | Status: DC
  Administered 2023-09-13 – 2023-09-14 (×2): 2.5 mg
  Filled 2023-09-13 (×2): qty 1

## 2023-09-13 MED ORDER — LACTULOSE 10 GM/15ML PO SOLN: 30.0000 g | Freq: Two times a day (BID) | ORAL | Status: DC

## 2023-09-13 MED ORDER — GABAPENTIN 250 MG/5ML PO SOLN
100.0000 mg | Freq: Three times a day (TID) | ORAL | Status: DC
Start: 2023-09-13 — End: 2023-09-14
  Administered 2023-09-13 – 2023-09-14 (×3): 100 mg
  Filled 2023-09-13 (×5): qty 2

## 2023-09-13 MED ORDER — POTASSIUM CHLORIDE 20 MEQ PO PACK
40.0000 meq | PACK | Freq: Once | ORAL | Status: AC
  Administered 2023-09-13: 40 meq via ORAL
  Filled 2023-09-13: qty 2

## 2023-09-13 MED ORDER — MIDODRINE HCL 5 MG PO TABS
15.0000 mg | ORAL_TABLET | Freq: Three times a day (TID) | ORAL | Status: DC
  Administered 2023-09-13 – 2023-09-14 (×2): 15 mg
  Filled 2023-09-13 (×2): qty 3

## 2023-09-13 MED ORDER — ENSURE ENLIVE PO LIQD
237.0000 mL | Freq: Two times a day (BID) | ORAL | Status: DC
Start: 2023-09-14 — End: 2023-09-14

## 2023-09-13 MED ORDER — EZETIMIBE 10 MG PO TABS
10.0000 mg | ORAL_TABLET | Freq: Every day | ORAL | Status: DC
  Administered 2023-09-13: 10 mg
  Filled 2023-09-13: qty 1

## 2023-09-13 MED ORDER — ADULT MULTIVITAMIN W/MINERALS CH
1.0000 | ORAL_TABLET | Freq: Two times a day (BID) | ORAL | Status: DC
  Administered 2023-09-13 – 2023-09-14 (×2): 1
  Filled 2023-09-13 (×2): qty 1

## 2023-09-13 MED ORDER — POTASSIUM CHLORIDE 20 MEQ PO PACK
40.0000 meq | PACK | Freq: Once | ORAL | Status: AC
  Administered 2023-09-13: 40 meq
  Filled 2023-09-13: qty 2

## 2023-09-13 MED ORDER — LACTULOSE 10 GM/15ML PO SOLN
30.0000 g | Freq: Two times a day (BID) | ORAL | Status: DC
  Administered 2023-09-13: 30 g
  Filled 2023-09-13 (×2): qty 45

## 2023-09-13 MED ORDER — MELATONIN 3 MG PO TABS: 3.0000 mg | ORAL_TABLET | Freq: Every evening | ORAL | Status: DC | PRN

## 2023-09-13 MED ORDER — ACETAMINOPHEN 325 MG PO TABS: 650.0000 mg | ORAL_TABLET | Freq: Four times a day (QID) | ORAL | Status: DC | PRN

## 2023-09-13 MED ORDER — OXYCODONE HCL 5 MG PO TABS
5.0000 mg | ORAL_TABLET | Freq: Four times a day (QID) | ORAL | Status: DC | PRN
  Administered 2023-09-14: 5 mg
  Filled 2023-09-13: qty 1

## 2023-09-13 MED ORDER — ROSUVASTATIN CALCIUM 5 MG PO TABS
10.0000 mg | ORAL_TABLET | Freq: Every day | ORAL | Status: DC
  Administered 2023-09-14: 10 mg
  Filled 2023-09-13: qty 2

## 2023-09-13 MED ORDER — POLYETHYLENE GLYCOL 3350 17 G PO PACK
17.0000 g | PACK | Freq: Every day | ORAL | Status: DC
  Administered 2023-09-14: 17 g
  Filled 2023-09-13: qty 1

## 2023-09-13 MED ORDER — RIFAXIMIN 550 MG PO TABS
550.0000 mg | ORAL_TABLET | Freq: Two times a day (BID) | ORAL | Status: DC
  Administered 2023-09-13 – 2023-09-14 (×2): 550 mg
  Filled 2023-09-13 (×3): qty 1

## 2023-09-13 MED ORDER — ALUM & MAG HYDROXIDE-SIMETH 200-200-20 MG/5ML PO SUSP: 15.0000 mL | Freq: Four times a day (QID) | ORAL | Status: DC | PRN

## 2023-09-13 MED ORDER — OXYCODONE HCL 5 MG PO TABS
5.0000 mg | ORAL_TABLET | Freq: Four times a day (QID) | ORAL | Status: DC | PRN
  Administered 2023-09-13: 5 mg via ORAL
  Filled 2023-09-13: qty 1

## 2023-09-13 MED ORDER — STERILE WATER FOR INJECTION IJ SOLN
INTRAMUSCULAR | Status: AC
  Filled 2023-09-13: qty 10

## 2023-09-13 MED ORDER — SPIRONOLACTONE 25 MG PO TABS
25.0000 mg | ORAL_TABLET | Freq: Every day | ORAL | Status: DC
  Administered 2023-09-14: 25 mg
  Filled 2023-09-13: qty 1

## 2023-09-13 NOTE — Assessment & Plan Note (Signed)
-   On chronic Plaquenil

## 2023-09-13 NOTE — Assessment & Plan Note (Signed)
-   continue preventative measures per protocol

## 2023-09-13 NOTE — Assessment & Plan Note (Signed)
-   NH3 peaked at 133 and has downtrended with lactulose  use; mentation has reportedly also slowly improved according to her son -has been on lactulose  and rifaximin  - despite treatment; labile NH3 and minimal improvement - Recommendation has now been made for transitioning to comfort care

## 2023-09-13 NOTE — Assessment & Plan Note (Signed)
-   A1c 5.1% on 08/26/2023 - Appears to overall be diet controlled prior to hospitalization - Some recent elevation in glucose levels likely due to tube feeds - if sustains can consider starting some sliding scale

## 2023-09-13 NOTE — Plan of Care (Signed)
   Problem: Education: Goal: Ability to describe self-care measures that may prevent or decrease complications (Diabetes Survival Skills Education) will improve Outcome: Progressing Goal: Individualized Educational Video(s) Outcome: Progressing   Problem: Coping: Goal: Ability to adjust to condition or change in health will improve Outcome: Progressing

## 2023-09-13 NOTE — Progress Notes (Signed)
 Physical Therapy Treatment Patient Details Name: Whitney [name] MRN: [id number] DOB: Oct 02, 1955 Today's Date: 09/13/2023   History of Present Illness 68 yo female admitted on 08/25/23 with acute encephalopathy. Transferred to ICU 4/26 for worsening ABG/ increased lethargy.  PMH: NASH cirrhosis, DM2, PAF on eliquis , sjogren's syndrome, severe AS s/p AVR, OSA on CPAP.    PT Comments  Pt resting in bed on arrival, increased confusion noted today, pt oriented to self and place. Pt able to follow simple single step commands with increased time, however needing max cues for sequencing and problem solving. Pt able to come to sitting EOB with up to mod A to manage Les, trunk and sequencing. Transfers deferred this session due to increased confusion. Pt able to maintain and self correct sitting balance intermittently as pt with tendency for R lateral lean and reliance on RUE support. On 4L [place], BP soft but stable, HR 110s-130s. RN aware of confusion. Pt continues to benefit from skilled PT services to progress toward functional mobility goals.       If plan is discharge home, recommend the following: A little help with bathing/dressing/bathroom;Assistance with cooking/housework;Direct supervision/assist for medications management;Assist for transportation;Supervision due to cognitive status;Help with stairs or ramp for entrance;A lot of help with walking and/or transfers   Can travel by private vehicle        Equipment Recommendations  Rolling walker (2 wheels)    Recommendations for Other Services       Precautions / Restrictions Precautions Precautions: Fall Recall of Precautions/Restrictions: Impaired Precaution/Restrictions Comments: monitor O2, BP, b/b incontinence; has cor trak and purewick Restrictions Weight Bearing Restrictions Per Provider Order: No     Mobility  Bed Mobility Overal bed mobility: Needs Assistance Bed Mobility: Rolling, Sidelying to Sit, Sit to Supine Rolling: Min  assist, Used rails Sidelying to sit: Min assist, Mod assist, +2 for safety/equipment   Sit to supine: Max assist, +2 for physical assistance   General bed mobility comments: difficulty sequencing and attending to tasks, min assist to come to EOB initally but returned to supine as MD asking to assess her.  2nd attempt to EOB increased to mod assist, for trunk, LEs and scooting given incrased time.  max to return supine and scoot to HOB +2.    Transfers                   General transfer comment: deferred due to lethargy and increased confusion    Ambulation/Gait                   Stairs             Wheelchair Mobility     Tilt Bed    Modified Rankin (Stroke Patients Only)       Balance Overall balance assessment: Needs assistance Sitting-balance support: Feet supported, Bilateral upper extremity supported Sitting balance-Leahy Scale: Poor Sitting balance - Comments: progressed to min guard, initally posterior and R lean with heavy preference to R UE support                                    Communication Communication Communication: Impaired Factors Affecting Communication: Difficulty expressing self  Cognition Arousal: Lethargic Behavior During Therapy: Flat affect                             Following commands: Impaired  Following commands impaired: Follows one step commands inconsistently, Follows one step commands with increased time    Cueing Cueing Techniques: Verbal cues, Gestural cues, Tactile cues, Visual cues  Exercises Other Exercises Other Exercises: working on sitting balance with placing hands on knees and shifting weight anterior    General Comments General comments (skin integrity, edema, etc.): BP soft but stable, HR 110s-130s during session. SpO2 stable on 4L      Pertinent Vitals/Pain Pain Assessment Pain Assessment: Faces Faces Pain Scale: Hurts little more Pain Location: generalized Pain  Descriptors / Indicators: Discomfort, Guarding Pain Intervention(s): Limited activity within patient's tolerance, Monitored during session    Home Living                          Prior Function            PT Goals (current goals can now be found in the care plan section) Acute Rehab PT Goals PT Goal Formulation: Patient unable to participate in goal setting Time For Goal Achievement: 09/17/23 Progress towards PT goals: Not progressing toward goals - comment (more confusion/lethargy this date)    Frequency    Min 3X/week      PT Plan      Co-evaluation PT/OT/SLP Co-Evaluation/Treatment: Yes Reason for Co-Treatment: For patient/therapist safety;Necessary to address cognition/behavior during functional activity;To address functional/ADL transfers PT goals addressed during session: Mobility/safety with mobility;Balance OT goals addressed during session: ADL's and self-care      AM-PAC PT "6 Clicks" Mobility   Outcome Measure  Help needed turning from your back to your side while in a flat bed without using bedrails?: A Little Help needed moving from lying on your back to sitting on the side of a flat bed without using bedrails?: A Lot Help needed moving to and from a bed to a chair (including a wheelchair)?: A Lot Help needed standing up from a chair using your arms (e.g., wheelchair or bedside chair)?: A Lot Help needed to walk in hospital room?: Total Help needed climbing 3-5 steps with a railing? : Total 6 Click Score: 11    End of Session Equipment Utilized During Treatment: Oxygen  Activity Tolerance: Patient limited by lethargy;Other (comment) (limited by increased confusion) Patient left: in bed;with bed alarm set;with call bell/phone within reach (with bed in chair position) Nurse Communication: Mobility status PT Visit Diagnosis: Other abnormalities of gait and mobility (R26.89)     Time: 1610-9604 PT Time Calculation (min) (ACUTE ONLY): 31  min  Charges:    $Therapeutic Activity: 8-22 mins PT General Charges $$ ACUTE PT VISIT: 1 Visit                     Raymel Cull R. PTA Acute Rehabilitation Services Office: [phone]   Agapito Horseman 09/13/2023, 3:04 PM

## 2023-09-13 NOTE — Progress Notes (Addendum)
 Advanced Heart Failure Rounding Note  Cardiologist: Janelle Mediate, MD  AH[facility]/PH Clinic: Dr. Mitzie Anda  Chief Complaint: HFpEF, Pulmonary Hypertension Subjective:    Lasix  gtt increased to 30/hr yesterday + given 5 of metolazone  + IV diamox  500 mg bid.   2.1L in UOP documented but I/Os incomplete. Unmeasured urinary occurences/incontinence. Foley removed yesterday, given frequent bowel movements/fecal incontinence and concern for UTI risk   CVP trending down, 12 on my read.   SCr 2.08>>1.92 K 3.2  Na 140 CO2 37  BP remains stable on midodrine , 90s systolic. Continues in Afib 110s-120s.   Remains confused. Only oriented to person. Not place or time. Multiple BMs yesterday. NH3 level not checked this morning.    Objective:    Weight Range: 101.2 kg Body mass index is 38.6 kg/m.   Vital Signs:   Temp:  [96.2 F (35.7 C)-98.2 F (36.8 C)] 98.1 F (36.7 C) (05/09 0727) Pulse Rate:  [85-128] 101 (05/09 0940) Resp:  [17-24] 18 (05/09 0836) BP: (78-106)/(59-93) 92/67 (05/09 0940) SpO2:  [95 %-100 %] 95 % (05/09 0836) FiO2 (%):  [35 %] 35 % (05/08 2220) Weight:  [101.2 kg] 101.2 kg (05/09 0453) Last BM Date : 09/12/23  Weight change: Filed Weights   09/09/23 0500 09/12/23 0445 09/13/23 0453  Weight: 100.2 kg 99.3 kg 101.2 kg   Intake/Output:  Intake/Output Summary (Last 24 hours) at 09/13/2023 0957 Last data filed at 09/13/2023 0456 Gross per 24 hour  Intake 1661.32 ml  Output 2040 ml  Net -378.68 ml    Physical Exam   CVP 12  General:  fatigued appearing. No respiratory difficulty HEENT: normal Neck: supple. JVD 12 cm w/ CV waves to jaw  Cor: PMI nondisplaced. Irregularly irregular rhythm and tachy rate 3/6 TR murmur  Lungs: decreased BS at the bases  Abdomen: soft, nontender, nondistended.  Extremities: no cyanosis, clubbing, rash, trace-1+ b/l LE edema + RUE PICC  Neuro: alert & oriented x 1 (person), affect flat.   Telemetry   Persistent Afib, 110s-120s   (personally reviewed)  Labs    CBC Recent Labs    09/11/23 0555 09/11/23 0818 09/12/23 0430  WBC 7.3  --  7.3  NEUTROABS 4.3  --  4.5  HGB 13.2 13.9 13.5  HCT 43.1 41.0 43.6  MCV 99.1  --  97.3  PLT 46*  --  38*   Basic Metabolic Panel Recent Labs    40/98/11 0430 09/12/23 2140 09/13/23 0300  NA 143 140 140  K 2.8* 3.5 3.2*  CL 100 96* 94*  CO2 36* 36* 37*  GLUCOSE 195* 237* 202*  BUN 50* 57* 56*  CREATININE 1.77* 2.08* 1.92*  CALCIUM  9.3 8.9 9.0  MG 2.0 2.1  --   PHOS 3.1  --   --    BNP (last 3 results) Recent Labs    09/03/23 0320 09/04/23 0322 09/05/23 0419  BNP 2,106.9* 9,147.8* 1,540.4*   ProBNP (last 3 results) Recent Labs    03/13/23 1428  PROBNP 996.0*   Medications:    Scheduled Medications:  apixaban   2.5 mg Oral BID   Chlorhexidine  Gluconate Cloth  6 each Topical Q0600   ezetimibe   10 mg Oral QHS   feeding supplement  237 mL Oral BID BM   feeding supplement (PROSource TF20)  60 mL Per Tube Daily   free water   200 mL Per Tube Q6H   gabapentin   100 mg Oral TID   Gerhardt's butt cream   Topical TID  hydroxychloroquine   200 mg Oral Daily   lactulose   30 g Oral Daily   lidocaine   1 patch Transdermal Q24H   megestrol   320 mg Oral Daily   metoprolol  succinate  25 mg Oral BID   midodrine   15 mg Oral TID WC   multivitamin with minerals  1 tablet Oral BID   pantoprazole   40 mg Oral Daily   polyethylene glycol  17 g Oral Daily   revefenacin   175 mcg Nebulization Daily   rifaximin   550 mg Oral BID   rosuvastatin   10 mg Oral Daily   senna  2 tablet Oral Daily   sodium chloride  flush  10-40 mL Intracatheter Q12H   spironolactone   25 mg Oral Daily   Infusions:  feeding supplement (OSMOLITE 1.5 CAL) 45 mL/hr at 09/13/23 0000   furosemide  (LASIX ) 200 mg in dextrose  5 % 100 mL (2 mg/mL) infusion 30 mg/hr (09/13/23 0937)     PRN Medications: acetaminophen  **OR** acetaminophen , albuterol , alum & mag hydroxide-simeth, antiseptic oral rinse,  bisacodyl , lip balm, melatonin, naLOXone  (NARCAN )  injection, ondansetron  (ZOFRAN ) IV, mouth rinse, sodium chloride  flush  Patient Profile   Whitney [name] is a 68-year-old female with a complex past medical history of Sjogren syndrome, cirrhosis, rheumatic heart disease with bioprosthetic AVR, mitral valve disease, pulmonary hypertension.    Assessment/Plan   HFpEF/ high output HF Pulmonary hypertension - RHC in 2024 most consistent with precapillary PH, started on tadalafil . Done after diuresis, and suspect this underestimates her group II component, especially given mitral valve disease - Does have a large component of portopulmonary HTN, especially given high cardiac output - Echo this admission: EF >75%, RV moderately enlarged, RVSP 51 mmHg, severe LAE - Hypotension on admission, suspect liver disease is main driver. Initially required NE and VP. Now off IV pressor support. SBPs stable in the 90s w/ midodrine  support. - now volume overloaded but responding well to diuretics. CVP trending down, 12 today  - continue lasix  gtt at 30 hr, Continue IV Diamox  500 mg bid.  - continue midodrine  15 mg tid  - GDMT limited by renal dysfunction and hypotension  - Poor SGLT2i candidate - Hold Tadalifil, suspect will not be able to resume given need high dose midodrine . - We discussed RHC w/ patient and family. Due to intermittent confusion and the severity of her heart disease, liver disease, pulmonary hypertension and CKD we opted for medical therapy only at this time   Acute on chronic hypoxic and hypercapnic respiratory failure - In setting of COPD, OSA and volume overload - Now on [place], 3L. Intermittent confusion - CO2 39, needs to use BiPAP at night. Repeat Diamox  today   AKI - Suspect mostly hepatorenal +/- component of cardiorenal - Prev improved with diuresis, now trending up. Likely in the setting of volume re-accumulation, 2.0 today. Stable past 24 hrs  - continue midodrine  for BP support   - diuresis as above  Valvular disease  - S/p bioprosthetic AVR, severe MAC - Gradients mildly high for AVR, but otherwise functioning well   Cirrhosis - due to NAFLD, w/ portal HTN - Follows at [facility] - Nadolol  on hold d/t hypotension  - continue lactulose  + rifaximin .  Follow NH3 level    PAF:  - Continue eliquis . Per hematology given current plt levels, use 2.5 mg bid  - rates variable, 110s-120s on tele but asymptomatic   - limited rate/rhythm control options  - avoiding amio w/ liver dz - metoprolol  started 5/7 but need to  monitor for s/o reduced CO  - monitor   Thrombocytopenia - In setting of cirrhosis - Plts have drifted from 80s>>46>>38K, historically 50s-60s. No gross bleeding  - per hematology  If platelet >50K, continue apixaban  5 mg twice daily If platelet 30-50K, apixaban  2.5 mg twice daily. If platelet <30K, hold apixaban   Hypernatremia - improving Na 149>>147>>141 today  -  Hypokalemia - K 3.3, in setting of diuresis + high stool output from lactulose  + rifaximin  - supp K per pharmD   Length of Stay: [address] PA-C  09/13/2023, 9:57 AM  Advanced Heart Failure Team Pager [phone] (M-F; 7a - 5p)  Please contact [facility] for night-coverage after hours (5p -7a ) and weekends on [email or web address]  Patient seen with PA, I formulated the plan and agree with the above note.   She is alert but more confused today.  Oriented only to person and "[place]."  Plts lower at 38K, creatinine higher at 2.03.  I am not sure how much she diuresed (incomplete I/Os) but CVP down to 14.   She remains in atrial fibrillation rate 100s-110s.   General: NAD Neck: JVP 14 cm, no thyromegaly or thyroid  nodule.  Lungs: Clear to auscultation bilaterally with normal respiratory effort. CV: Nondisplaced PMI.  Heart tachy, irregular S1/S2, no S3/S4, no murmur.  1+ ankle edema.  Abdomen: Soft, nontender, no hepatosplenomegaly, no distention.  Skin: Intact without  lesions or rashes.  Neurologic: Alert but confused Psych: Normal affect. Extremities: No clubbing or cyanosis.  HEENT: Normal.   Patient has cirrhosis due to NAFLD with portopulmonary hypertension and RV failure.  CVP now lower with diuresis (21 => 14), but creatinine higher at 2.03.  Still volume overloaded.  - Continue Lasix  30 mg/hr today. Replace K.  - Hold off on metolazone .  - Will give 1 dose of acetazolamide  today.   - Continue spironolactone  25 daily, will have to stop if creatinine rises further.  - Continue midodrine  15 tid.  - Tadalafil  on hold with low BP.    NH3 trended down with lactulose  and rifaximin  (hepatic encephalopathy) but she is more confused today, will send ammonia level.    Remains in AF with rate 110s.  Continue Toprol  XL 25 mg bid.  Hopefully BP will continue to tolerate with midodrine .  Trying to avoid amiodarone  with decompensated liver disease.  She will remain on apixaban  but dose decreased to 2.5 mg bid by hematology with plts down to 38 K.   We will need to start thinking about goals of care here.   Peder Bourdon 09/13/2023 1:02 PM

## 2023-09-13 NOTE — Progress Notes (Addendum)
 Daily Progress Note   Patient Name: Whitney [name]       Date: 09/13/2023 DOB: 01/08/1956  Age: 68 y.o. MRN#: [id number] Attending Physician: Faith Homes, MD Primary Care Physician: Jimmey Mould, MD Admit Date: 08/25/2023  Reason for Consultation/Follow-up: Establishing goals of care  Length of Stay: 19  Current Medications: Scheduled Meds:   apixaban   2.5 mg Oral BID   Chlorhexidine  Gluconate Cloth  6 each Topical Q0600   ezetimibe   10 mg Oral QHS   feeding supplement  237 mL Oral BID BM   feeding supplement (PROSource TF20)  60 mL Per Tube Daily   free water   200 mL Per Tube Q6H   gabapentin   100 mg Oral TID   Gerhardt's butt cream   Topical TID   hydroxychloroquine   200 mg Oral Daily   lactulose   30 g Oral Daily   lidocaine   1 patch Transdermal Q24H   megestrol   320 mg Oral Daily   metoprolol  succinate  25 mg Oral BID   midodrine   15 mg Oral TID WC   multivitamin with minerals  1 tablet Oral BID   pantoprazole   40 mg Oral Daily   polyethylene glycol  17 g Oral Daily   revefenacin   175 mcg Nebulization Daily   rifaximin   550 mg Oral BID   rosuvastatin   10 mg Oral Daily   senna  2 tablet Oral Daily   sodium chloride  flush  10-40 mL Intracatheter Q12H   spironolactone   25 mg Oral Daily    Continuous Infusions:  feeding supplement (OSMOLITE 1.5 CAL) 45 mL/hr at 09/13/23 0000   furosemide  (LASIX ) 200 mg in dextrose  5 % 100 mL (2 mg/mL) infusion 30 mg/hr (09/13/23 0937)    PRN Meds: acetaminophen  **OR** acetaminophen , albuterol , alum & mag hydroxide-simeth, antiseptic oral rinse, bisacodyl , lip balm, melatonin, naLOXone  (NARCAN )  injection, ondansetron  (ZOFRAN ) IV, mouth rinse, sodium chloride  flush  Physical Exam Vitals reviewed.  Constitutional:      General: She  is awake.     Interventions: Nasal cannula in place.     Comments: cortrak  Cardiovascular:     Rate and Rhythm: Tachycardia present.  Pulmonary:     Effort: Pulmonary effort is normal.  Skin:    General: Skin is warm and dry.  Neurological:  Mental Status: She is disoriented.     Comments: Oriented to person and place             Vital Signs: BP 92/79 (BP Location: Left Arm)   Pulse (!) 103   Temp 98.7 F (37.1 C) (Oral)   Resp 20   Ht 5' 3.75" (1.619 m)   Wt 101.2 kg   SpO2 90%   BMI 38.60 kg/m  SpO2: SpO2: 90 % O2 Device: O2 Device: Nasal Cannula O2 Flow Rate: O2 Flow Rate (L/min): 2 L/min    Patient Active Problem List   Diagnosis Date Noted   Acute on chronic respiratory failure with hypoxia and hypercapnia (HCC) 09/13/2023   AKI (acute kidney injury) (HCC) 09/13/2023   Hypernatremia 09/13/2023   Inadequate oral intake 09/13/2023   Hypokalemia 09/13/2023   At high risk for bleeding 09/12/2023   Acute on chronic heart failure with preserved ejection fraction (HFpEF) (HCC) 09/09/2023   Pressure injury of skin 09/09/2023   Altered mental status 08/26/2023   Acute hepatic encephalopathy (HCC) 08/25/2023   Generalized weakness 08/25/2023   Dog bite 08/25/2023   Moderate persistent asthma 08/25/2023   Acute diastolic heart failure (HCC) 04/07/2023   Peripheral edema 04/07/2023   Acute hypoxemic respiratory failure (HCC) 03/15/2023   Chronic hypoxic respiratory failure (HCC) 03/14/2023   DM2 (diabetes mellitus, type 2) (HCC) 03/14/2023   Elevated troponin 03/14/2023   COPD with acute exacerbation (HCC) 03/12/2023   Acute respiratory failure (HCC) 03/12/2023   Elevated diaphragm 03/12/2023   History of TIA (transient ischemic attack) 03/12/2023   Hypercoagulable state due to paroxysmal atrial fibrillation (HCC) 05/08/2022   History of CVA (cerebrovascular accident) 04/13/2022   COPD (chronic obstructive pulmonary disease) (HCC) 01/18/2021   Pulmonary  hypertension (HCC) 06/15/2019   Dyspnea 01/06/2019   Paroxysmal atrial fibrillation (HCC) 01/24/2018   S/P AVR 12/05/2017   Severe thrombocytopenia (HCC) 11/13/2017   Other neutropenia (HCC) 11/13/2017   Numbness in feet 10/14/2017   Severe aortic stenosis    Inguinal hernia 10/22/2016   Obesity (BMI 30.0-34.9) 04/26/2016   Lower extremity edema 06/22/2015   Status post bariatric surgery 03/29/2014   Aortic valve disorder 04/21/2013   Anasarca 04/21/2013   Depression    OSA (obstructive sleep apnea)    Sjogren's syndrome (HCC)    Liver cirrhosis secondary to NASH (HCC)    HLD (hyperlipidemia)    Fibromyalgia    Postmenopausal bleeding 12/11/2012   Endometrial polyp 12/11/2012   NASH (nonalcoholic steatohepatitis) 08/06/2012   Chronic cough 04/11/2011    Palliative Care Assessment & Plan   Patient Profile: 68 y.o. female  with past medical history of NASH cirrhosis, pulmonary hypertension, sjogren's syndrome, DMII, paroxysmal a-fib on Eliquis , TIA, and anemia admitted on 08/25/2023 with altered mental status, acute on chronic respiratory failure and AKI. Required vasopressor support in ICU.   Patient is now day 18 or hospitalization and out of the ICU. Patient faces treatment option decisions, advanced directive decisions, and anticipatory care needs.  Today's Discussion: Patient sitting up in bed in NAD. Reports she is very sleepy today. She remains intermittently confused-- she is oriented to person and place but not year. She is agreeable to me calling her son Dorothea Gata.  1330: Spoke to son Dorothea Gata. Update given. He shared that the patient is often confused about the year. He asked if she has been out of the bed and I share that she was in bed and very sleepy when I saw her. He asked  when the last ammonia level was drawn and I shared that another level has recently been ordered. He plans to come to hospital this afternoon. We agree to meet tomorrow morning in patient's room.  1345:  Discussed with chaplain Willetta Harpin that patient remains intermittently confused and today would not be appropriate for [facility] document completion.  Recommendations/Plan: Full code Full scope of care Continued PMT support    Code Status:    Code Status Orders  (From admission, onward)           Start     Ordered   08/25/23 1931  Full code  Continuous       Question:  By:  Answer:  Consent: discussion documented in EHR   08/25/23 1930           Extensive chart review has been completed prior to seeing the patient including labs, vital signs, imaging, progress/consult notes, orders, medications, and available advance directive documents.  Care plan was discussed with: chaplain Willetta Harpin  Time spent: 35 minutes  Thank you for allowing the Palliative Medicine Team to assist in the care of this patient.   Daina Drum, NP  Please contact Palliative Medicine Team phone at [phone] for questions and concerns.

## 2023-09-13 NOTE — Assessment & Plan Note (Signed)
-   Attributed to underlying cirrhosis.  Evaluated by hematology on 09/12/2023 as well - If platelet >50K, continue apixaban  5 mg twice daily. If platelet 30-50K, apixaban  2.5 mg twice daily. If platelet <30K, hold apixaban . Avoid antiplatelet and NSAIDs - Transfuse platelets for counts <10K or <50 K with active bleeding  - Baseline platelets appear to be in the 40s -70 range.  - Comfort care now

## 2023-09-13 NOTE — Progress Notes (Signed)
 SLP Cancellation Note  Patient Details Name: Whitney [name] MRN: [id number] DOB: 05-05-1956   Cancelled treatment:  Pt with other staff upon attempts to see. Will continue efforts.  Sender Rueb L. Beatris [name], MA CCC/SLP Clinical Specialist - Acute Care SLP Acute Rehabilitation Services Office number [phone]       Whitney [name] 09/13/2023, 2:17 PM

## 2023-09-13 NOTE — Progress Notes (Signed)
 This chaplain reviewed the Pt. Chart and received an update from Palliative Medicine Team. The chaplain understands the Pt. intermittent confusion prevents creating/updating an Advance Directive today. This chaplain will plan a revisit.  Chaplain Kathleene Papas [phone]

## 2023-09-13 NOTE — Assessment & Plan Note (Signed)
-   Appreciate RD following - s/p cortrak with TF - no transitioning to comfort

## 2023-09-13 NOTE — PMR Pre-admission (Shared)
 PMR Admission Coordinator Pre-Admission Assessment  Patient: Whitney [name] is an 68 y.o., female MRN: [id number] DOB: March 05, 1956 Height: 5' 3.75" (161.9 cm) Weight: 101.2 kg              Insurance Information HMO:     PPO: yes     PCP:      IPA:      80/20:      OTHER:  PRIMARY: [employer] Choice PPO      Policy#: [id number]      Subscriber: pt CM Name: ***      Phone#: [phone]     Fax#: [phone] Pre-Cert#: ***      Employer:  Benefits:  Phone #: [phone]     Name:  Venson Ginger. Date: ***     Deduct: ***      Out of Pocket Max: ***      Life Max:   CIR: ***      SNF: *** Outpatient: ***     Co-Pay: *** Home Health: ***      Co-Pay: *** DME: ***     Co-Pay: *** Providers:  SECONDARY:       Policy#:       Phone#:   Financial Counselor:       Phone#:   The "Data Collection Information Summary" for patients in Inpatient Rehabilitation Facilities with attached "Privacy Act Statement-Health Care Records" was provided and verbally reviewed with: Patient and Family  Emergency Contact Information Contact Information     Name Relation Home Work Mobile   Jones,Lucas Son [phone]     Shropshire,Carrie Granddaughter [phone]     Arlie Lain   [phone]   Gayle Kava Daughter   [phone]      Other Contacts   None on File    Current Medical History  Patient Admitting Diagnosis: hepatic encephalopathy, multiple organ failure, debility  History of Present Illness: Pt is a 68 y/o female with PMH of NASH cirrhosis, DM, PAF on eliquis , sjorgren's syndrome, fibromyalgia, severe aortic stenosis s/p AVR, and OSA requiring CPAP.  She was admitted to [facility] on 4/20 with c/o generalized weakness x1 week and new onset AMS.  She'd had presentated to the [facility] for same on 4/19 and was discharged home by Dr. Aldean Amass.  On 4/20 she arrived to [facility] via EMS with AMS.  Her NIHSS on arrival was 5.  Neurology consulted and recommended MRI and  EEG, toxic/metabolic workup.  MRI revealed small right frontal lobe stroke.  Hospital course complicated by worsening encephalopathy, respiratory failure, acute on chronic HF, AKI, PAF, thrombocytopenia. She required precedex  to maintain compliance on bipap for respiratory failure, diuresis with pressor support for HF, amio gtt for AF with RVR.  Encephalopathy has been persistent.  Oncology consulted for anticoagulation in the setting of thrombocytopenia and made recommendations on 5/8.  Cortrak for supplemental nutrition though she is on a reg/thin diet.  She is stable on nasal cannula for supplemental O2 with bipap at night.  HR controlled.  Therapy ongoing and pt has been recommended for CIR.   Complete NIHSS TOTAL: 1 Glasgow Coma Scale Score: 14  Patient's medical record from [facility] has been reviewed by the rehabilitation admission coordinator and physician.  Past Medical History  Past Medical History:  Diagnosis Date   Allergic rhinitis    Anemia    hx   Arthritis    Asthma    hx yrs ago   Cirrhosis (HCC) last albumin  3.3 done at [facility] 06-16-2014 (  under care everywhere tab in epic)   Secondary to Fatty liver --  followed by hepatology at [facility] (dr Orest Bio)    Depression    Diabetes mellitus type II    type 2 diet conrolled   Dyspnea    Fibromyalgia    GERD (gastroesophageal reflux disease)    Heart murmur    asymptomatic ---  1989 from rhuematic fever   History of exercise stress test    05-05-2013---  negative bruce ETT given exercise workload,  no ischemia   History of hiatal hernia    History of kidney stones    History of rheumatic fever    1989   Hyperlipidemia    Leukocytopenia    Moderate aortic stenosis    AVA area 1.1cm2---  cardiologist --  dr Armanda Lan, 2014 in epic   NASH (nonalcoholic steatohepatitis)    OSA (obstructive sleep apnea)    was using CPAP before gastric sleeve 2015--  no uses after wt loss   Pneumonia    hx   Pulmonary hypertension (HCC)     Sjogren's syndrome (HCC)    Thrombocytopenia (HCC)     Has the patient had major surgery during 100 days prior to admission? No  Family History  family history includes Allergies in her daughter; Alzheimer's disease in her father; Asthma in her daughter, father, and paternal grandmother; Heart attack in her father; Heart disease in her father and mother; Hip fracture in her father; Hypertension in her father; Rheum arthritis in her mother.   Current Medications   Current Facility-Administered Medications:    acetaminophen  (TYLENOL ) tablet 650 mg, 650 mg, Oral, Q6H PRN **OR** acetaminophen  (TYLENOL ) suppository 650 mg, 650 mg, Rectal, Q6H PRN, Utomwen, Adesuwa, RPH   albuterol  (PROVENTIL ) (2.5 MG/3ML) 0.083% nebulizer solution 2.5 mg, 2.5 mg, Nebulization, Q4H PRN, Howerter, Justin B, DO, 2.5 mg at 08/29/23 0831   alum & mag hydroxide-simeth (MAALOX/MYLANTA) 200-200-20 MG/5ML suspension 15 mL, 15 mL, Oral, Q6H PRN, Utomwen, Adesuwa, RPH   antiseptic oral rinse (BIOTENE) solution 15 mL, 15 mL, Mouth Rinse, PRN, Agarwala, Ravi, MD   apixaban  (ELIQUIS ) tablet 2.5 mg, 2.5 mg, Oral, BID, Gleason, Laura R, PA-C, 2.5 mg at 09/13/23 9629   bisacodyl  (DULCOLAX) EC tablet 5 mg, 5 mg, Oral, Daily PRN, Lemmie Pyo, NP   Chlorhexidine  Gluconate Cloth 2 % PADS 6 each, 6 each, Topical, Q0600, Mannam, Praveen, MD, 6 each at 09/12/23 1000   ezetimibe  (ZETIA ) tablet 10 mg, 10 mg, Oral, QHS, Utomwen, Adesuwa, RPH, 10 mg at 09/12/23 2112   feeding supplement (ENSURE ENLIVE / ENSURE PLUS) liquid 237 mL, 237 mL, Oral, BID BM, Utomwen, Adesuwa, RPH, 237 mL at 09/11/23 0950   feeding supplement (OSMOLITE 1.5 CAL) liquid 1,000 mL, 1,000 mL, Per Tube, Continuous, Albustami, Linna Richard, MD, Last Rate: 45 mL/hr at 09/13/23 0000, Infusion Verify at 09/13/23 0000   feeding supplement (PROSource TF20) liquid 60 mL, 60 mL, Per Tube, Daily, Albustami, Linna Richard, MD, 60 mL at 09/13/23 0942   free water  200 mL, 200 mL, Per Tube,  Q6H, Shelli Dexter M, PA-C, 200 mL at 09/13/23 0544   furosemide  (LASIX ) 200 mg in dextrose  5 % 100 mL (2 mg/mL) infusion, 30 mg/hr, Intravenous, Continuous, Darlis Eisenmenger, MD, Last Rate: 15 mL/hr at 09/13/23 0937, 30 mg/hr at 09/13/23 5284   gabapentin  (NEURONTIN ) capsule 100 mg, 100 mg, Oral, TID, Utomwen, Adesuwa, RPH, 100 mg at 09/13/23 0939   Gerhardt's butt cream, , Topical, TID,  Gleason, Curlee Doss, Given at 09/13/23 1012   hydroxychloroquine  (PLAQUENIL ) tablet 200 mg, 200 mg, Oral, Daily, Utomwen, Adesuwa, RPH, 200 mg at 09/13/23 0941   lactulose  (CHRONULAC ) 10 GM/15ML solution 30 g, 30 g, Oral, Daily, Albustami, Omar M, MD, 30 g at 09/13/23 0981   lidocaine  (LIDODERM ) 5 % 1 patch, 1 patch, Transdermal, Q24H, Vita Grip, MD, 1 patch at 09/12/23 1541   lip balm (CARMEX) ointment, , Topical, PRN, Dewald, Jonathan B, MD   megestrol  (MEGACE ) tablet 320 mg, 320 mg, Oral, Daily, Albustami, Omar M, MD, 320 mg at 09/13/23 0941   melatonin tablet 3 mg, 3 mg, Oral, QHS PRN, Utomwen, Adesuwa, RPH, 3 mg at 09/10/23 2227   metoprolol  succinate (TOPROL -XL) 24 hr tablet 25 mg, 25 mg, Oral, BID, Girguis, David, MD, 25 mg at 09/13/23 0940   midodrine  (PROAMATINE ) tablet 15 mg, 15 mg, Oral, TID WC, Utomwen, Adesuwa, RPH, 15 mg at 09/13/23 0940   multivitamin with minerals tablet 1 tablet, 1 tablet, Oral, BID, Utomwen, Adesuwa, RPH, 1 tablet at 09/13/23 1914   naloxone  (NARCAN ) injection 0.4 mg, 0.4 mg, Intravenous, PRN, Howerter, Justin B, DO   ondansetron  (ZOFRAN ) injection 4 mg, 4 mg, Intravenous, Q6H PRN, Howerter, Justin B, DO, 4 mg at 09/08/23 1103   Oral care mouth rinse, 15 mL, Mouth Rinse, PRN, Babcock, Peter E, NP   pantoprazole  (PROTONIX ) EC tablet 40 mg, 40 mg, Oral, Daily, Desai, Nikita S, MD, 40 mg at 09/13/23 0940   polyethylene glycol (MIRALAX  / GLYCOLAX ) packet 17 g, 17 g, Oral, Daily, Utomwen, Adesuwa, RPH, 17 g at 09/13/23 0942   revefenacin  (YUPELRI ) nebulizer solution  175 mcg, 175 mcg, Nebulization, Daily, Babcock, Peter E, NP, 175 mcg at 09/13/23 0836   rifaximin  (XIFAXAN ) tablet 550 mg, 550 mg, Oral, BID, Albustami, Omar M, MD, 550 mg at 09/13/23 0941   rosuvastatin  (CRESTOR ) tablet 10 mg, 10 mg, Oral, Daily, Utomwen, Adesuwa, RPH, 10 mg at 09/13/23 0939   senna (SENOKOT) tablet 17.2 mg, 2 tablet, Oral, Daily, Utomwen, Adesuwa, RPH, 17.2 mg at 09/13/23 0939   sodium chloride  flush (NS) 0.9 % injection 10-40 mL, 10-40 mL, Intracatheter, Q12H, Agarwala, Ravi, MD, 10 mL at 09/13/23 7829   sodium chloride  flush (NS) 0.9 % injection 10-40 mL, 10-40 mL, Intracatheter, PRN, Agarwala, Ravi, MD   spironolactone  (ALDACTONE ) tablet 25 mg, 25 mg, Oral, Daily, McLean, Dalton S, MD, 25 mg at 09/13/23 0941  Patients Current Diet:  Diet Order             Diet regular Room service appropriate? Yes; Fluid consistency: Thin  Diet effective now                   Precautions / Restrictions Precautions Precautions: Fall Precaution/Restrictions Comments: monitor O2, BP, b/b incontinence; has cor trak and foley Restrictions Weight Bearing Restrictions Per Provider Order: No   Has the patient had 2 or more falls or a fall with injury in the past year?Yes  Prior Activity Level Community (5-7x/wk): driving, independent/mod I at baseline with occasional rollator  Prior Functional Level Prior Function Prior Level of Function : Driving, Independent/Modified Independent Mobility Comments: pt reports typically ambulating without DME, does utilize rollator PRN ADLs Comments: Manages her own meds, cooks, home mgt;very independent. Caretaking for her son at times driving him to doctors appointments  Self Care: Did the patient need help bathing, dressing, using the toilet or eating?  Independent  Indoor Mobility: Did the patient need assistance  with walking from room to room (with or without device)? Independent  Stairs: Did the patient need assistance with internal or  external stairs (with or without device)? Independent  Functional Cognition: Did the patient need help planning regular tasks such as shopping or remembering to take medications? Independent  Patient Information Are you of Hispanic, Latino/a,or Spanish origin?: A. No, not of Hispanic, Latino/a, or Spanish origin What is your race?: A. White Do you need or want an interpreter to communicate with a doctor or health care staff?: 0. No  Patient's Response To:  Health Literacy and Transportation Is the patient able to respond to health literacy and transportation needs?: Yes Health Literacy - How often do you need to have someone help you when you read instructions, pamphlets, or other written material from your doctor or pharmacy?: Never In the past 12 months, has lack of transportation kept you from medical appointments or from getting medications?: No In the past 12 months, has lack of transportation kept you from meetings, work, or from getting things needed for daily living?: No  Journalist, newspaper / Equipment Home Equipment: Grab bars - toilet, Grab bars - tub/shower, Shower seat, Rollator (4 wheels)  Prior Device Use: Indicate devices/aids used by the patient prior to current illness, exacerbation or injury? None of the above  Current Functional Level Cognition  Overall Cognitive Status: Difficult to assess Orientation Level: Oriented to person, Disoriented to place, Disoriented to time, Disoriented to situation    Extremity Assessment (includes Sensation/Coordination)  Upper Extremity Assessment: Right hand dominant, Generalized weakness  Lower Extremity Assessment: Defer to PT evaluation    ADLs  Overall ADL's : Needs assistance/impaired Eating/Feeding: Independent Grooming: Brushing hair, Sitting Grooming Details (indicate cue type and reason): combed right side but not left even following verbal and tactile cues Upper Body Bathing: Supervision/ safety, Sitting Lower  Body Bathing: Minimal assistance, Sit to/from stand Upper Body Dressing : Supervision/safety, Sitting Lower Body Dressing: Maximal assistance, Sitting/lateral leans Lower Body Dressing Details (indicate cue type and reason): attempting to pull up socks but unable to reach Toilet Transfer: Minimal assistance, Stand-pivot, Rolling walker (2 wheels), BSC/3in1 Toilet Transfer Details (indicate cue type and reason): simulated via functional mobility Toileting- Clothing Manipulation and Hygiene: Minimal assistance, Sit to/from stand Functional mobility during ADLs: Minimal assistance, Rolling walker (2 wheels) General ADL Comments: daugther and granddaughter present and assisted with cueing    Mobility  Overal bed mobility: Needs Assistance Bed Mobility: Rolling, Sidelying to Sit Rolling: Min assist, Used rails Sidelying to sit: Min assist, +2 for safety/equipment, HOB elevated, Used rails Supine to sit: Min assist, HOB elevated, Used rails Sit to supine: Contact guard assist, Used rails Sit to sidelying: Min assist, Used rails General bed mobility comments: difficulty with sequencing for bed mobility with verbal and tactile cues    Transfers  Overall transfer level: Needs assistance Equipment used: Rolling walker (2 wheels) Transfers: Sit to/from Stand, Bed to chair/wheelchair/BSC Sit to Stand: Mod assist, From elevated surface, +2 physical assistance Bed to/from chair/wheelchair/BSC transfer type:: Step pivot Step pivot transfers: Mod assist, +2 physical assistance Transfer via Lift Equipment: Stedy General transfer comment: cues for hand placement with patient demonstrating difficulty following instructions. Patient demonstrating posterior leaning when standing. Pt able to stand to [facility] with mod cues and mod assist to power up. Pt stood and then sat on pads. Moved pt to the recliner and she stood again to get in recliner. HR to 130's therefore did not push pt too much.  BP stable. Pt  fatigues quickly.    Ambulation / Gait / Stairs / Wheelchair Mobility  Ambulation/Gait Ambulation/Gait assistance: Mod assist, +2 safety/equipment Gait Distance (Feet): 4 Feet Assistive device: 2 person hand held assist Gait Pattern/deviations: Wide base of support, Shuffle, Step-to pattern, Decreased dorsiflexion - right, Decreased dorsiflexion - left General Gait Details: fatigues after step pivot to chair so defer longer distance gait progression Gait velocity: decreased Gait velocity interpretation: <1.31 ft/sec, indicative of household ambulator Stairs: Yes Stairs assistance: Contact guard assist Stair Management: Two rails, Alternating pattern, Forwards Number of Stairs: 2 General stair comments: no LOB noted.    Posture / Balance Dynamic Sitting Balance Sitting balance - Comments: CGA with UE support at EOB for safety Balance Overall balance assessment: Needs assistance Sitting-balance support: Feet supported, Bilateral upper extremity supported Sitting balance-Leahy Scale: Poor Sitting balance - Comments: CGA with UE support at EOB for safety Standing balance support: Bilateral upper extremity supported, During functional activity Standing balance-Leahy Scale: Poor Standing balance comment: reliant on external support +2 and Stedy    Special needs/care consideration BiPAP, Oxygen  2L [place], and Diabetic management A1C 5.1 on 4/21     Previous Home Environment (from acute therapy documentation) Living Arrangements: Children  Lives With: Son Available Help at Discharge: Family, Available 24 hours/day Type of Home: House Home Layout: One level, Laundry or work area in basement Home Access: Stairs to enter Entrance Stairs-Rails: None Secretary/administrator of Steps: 2 steps + 2 steps Bathroom Shower/Tub: Tub/shower unit, Merchandiser, retail: Yes Home Care Services: No  Discharge Living Setting Plans for Discharge Living Setting:  Patient's home, Lives with (comment) (son, Dorothea Gata) Type of Home at Discharge: House Discharge Home Layout: One level Discharge Home Access: Stairs to enter Entrance Stairs-Rails: None Entrance Stairs-Number of Steps: 2+2 Discharge Bathroom Shower/Tub: Tub/shower unit Discharge Bathroom Toilet: Standard Discharge Bathroom Accessibility: Yes How Accessible: Accessible via walker Does the patient have any problems obtaining your medications?: No  Social/Family/Support Systems Anticipated Caregiver: son Dorothea Gata is primary caregiver (lives with pt at baseline), also has supportive daughter and granddaughters Anticipated Caregiver's Contact Information: Dorothea Gata  [phone] Ability/Limitations of Caregiver: none stated Caregiver Availability: 24/7 Discharge Plan Discussed with Primary Caregiver: Yes Is Caregiver In Agreement with Plan?: Yes   Goals Patient/Family Goal for Rehab: PT/OT supervision to min assist, SLP supervision to mod I Expected length of stay: 10-14 days Additional Information: Discharge plan: return to pt's home where her son lives with her, he can provide 24/7 supervision/assist Pt/Family Agrees to Admission and willing to participate: Yes Program Orientation Provided & Reviewed with Pt/Caregiver Including Roles  & Responsibilities: Yes   Decrease burden of Care through IP rehab admission: n/a   Possible need for SNF placement upon discharge: Not anticipated.  Plan for d/c home with pt's son, Dorothea Gata, able to provide 24/7 assist.  Pt also has supportive daughter/son in law, and grand children who are adults.     Patient Condition: This patient's medical and functional status has changed since the consult dated: 5/1 in which the Rehabilitation Physician determined and documented that the patient's condition is appropriate for intensive rehabilitative care in an inpatient rehabilitation facility. See "History of Present Illness" (above) for medical update. Functional changes  are: ***. Patient's medical and functional status update has been discussed with the Rehabilitation physician and patient remains appropriate for inpatient rehabilitation. Will admit to inpatient rehab pending insurance approval ***.  Preadmission Screen Completed By:  Mckinlee Dunk E Kamiya Acord, PT, DPT 09/13/2023  10:14 AM ______________________________________________________________________   Discussed status with Dr. Aaron Aason***at *** and received approval for admission today.  Admission Coordinator:  Trayon Krantz E Therron Sells, time***/Date***

## 2023-09-13 NOTE — Assessment & Plan Note (Signed)
-   Developed RVR during hospitalization - Amiodarone  being avoided per cardiology in setting of liver disease - Discontinuing Eliquis  and Toprol  in setting of pursuing comfort care

## 2023-09-13 NOTE — Assessment & Plan Note (Addendum)
-   Per previous note, bit on left forearm on 08/20/2023 by her brothers dog (immunized) and discharged home from ER on Augmentin  - Wound noted to be healing per prior notes and no further need for antibiotics

## 2023-09-13 NOTE — Progress Notes (Addendum)
 Nutrition Follow-up  DOCUMENTATION CODES:  Obesity unspecified  INTERVENTION:  Continue TF via Cortrak: Osmolite 1.5 at 67ml/hr ( per day) 60ml ProSource TF20 once daily FWF 200ml q6 hours  Provides 1700 kcal, 88g protein, free water  daily (FWF + TF) Continue regular diet as ordered and encourage intake [place] yogurt with all meals; family to bring food when visiting Ensure Enlive po BID, each supplement provides 350 kcal and 20 grams of protein. Magic cup TID with meals, each supplement provides 290 kcal and 9 grams of protein Bariatric MVI regimen BID; hold TUMS TID d/t calcium  WDL Recommend insulin  regimen  NUTRITION DIAGNOSIS:  Inadequate oral intake related to acute illness, poor appetite as evidenced by per patient/family report. - remains applicable  GOAL:  Patient will meet greater than or equal to 90% of their needs - meeting via TF regimen  MONITOR:  PO intake, Supplement acceptance, Diet advancement, Labs, Weight trends, I & O's  REASON FOR ASSESSMENT:  Rounds   ASSESSMENT:   Pt admitted with c/o AMS r/t hypercapnic respiratory failure. PMH significant for NASH cirrhosis, HTN, HLD, pulmonary HTN, Sjogren's syndrome, thrombocytopenia, DM2, anemia.  Met with patient and son at bedside. Continue to encourage oral intake as documented meal intakes. Calcium  supplementation stopped. Bariatric MVI in place as patient endorsed she had not been taking these. At risk for micronutrient deficiencies.   Average Meal Intake 5/6: 15-20% x2 documented meals 5/7: 0-25% x3 documented meals 5/8: 10-25% x3 documented meals  Intake remains inadequate, per documentation and patient report. TF now running at goal rate. Pt tolerating. No endorsed N/V/C/D. Intake remains poor, but somewhat improved. Spoke with patient and her son at bedside.   Admit weight: 105.2 kg Current weight: 101.2 kg  Weight down from admission, although not significantly. Some generalized,  non-pitting edema continues. IV Lasix  continues. Rectal tube removed. If bowel frequency increases, recommend stool bulking agent like Banatrol. This would also help supplement potassium.   Intake/Output Summary (Last 24 hours) at 09/13/2023 1144 Last data filed at 09/13/2023 1037 Gross per 24 hour  Intake 1600.67 ml  Output 2500 ml  Net -899.33 ml    Net IO Since Admission: -10,650.35 mL [09/13/23 1144]    Drains/lines: Cortrak (68cm) placed 5/7 PICC (placed 4/28) UOP: x24 hours  Sodium has improved. Megace  in place. Bowel regimen scheduled. Crt trending up. May consider insulin  regimen for blood sugar management now that tube feed is at goal rate. Spoke with MD who is amicable to this.   Medications: lactulose , Megace , MVI BID, pantoprazole ,  Miralax , senna, spironolactone  Drips: Lasix  160mg  BID IV   Labs:  Sodium 143>140>140 (wdl) K+ 2.8>3.5>3.2 (L) BUN 47 Crt 2.03 CBG's [id number] hours A1c 5.1 (08/2023)    Diet Order:   Diet Order             Diet regular Room service appropriate? Yes; Fluid consistency: Thin  Diet effective now            EDUCATION NEEDS:  Education needs have been addressed  Skin:  Skin Assessment: Skin Integrity Issues: Skin Integrity Issues:: Stage I Stage I: nose adn sacrum  Last BM:  5/8 - type 6 x1  Height:  Ht Readings from Last 1 Encounters:  08/27/23 5' 3.75" (1.619 m)   Weight:  Wt Readings from Last 1 Encounters:  09/13/23 101.2 kg   Ideal Body Weight:  54.5 kg  BMI:  Body mass index is 38.6 kg/m.  Estimated Nutritional Needs:   Kcal:  1600-1800  Protein:  75-90g  Fluid:  >/=1.5L  Con Decant MS, RD, LDN Registered Dietitian Clinical Nutrition RD Inpatient Contact Info in Amion

## 2023-09-13 NOTE — Assessment & Plan Note (Signed)
-   Prolonged hospitalization placing at risk for further physical deconditioning.  Undergoing CIR evaluation - Continue working with PT/OT

## 2023-09-13 NOTE — Assessment & Plan Note (Signed)
-   Has been considered a component of hepatorenal +/- CRS - normal renal fxn in April; slowly uptrended and around 1.8 - 2 currently - will have to see if recovers and where stabilizes ultimately - no indication for HD at this time

## 2023-09-13 NOTE — Assessment & Plan Note (Signed)
 Repleted.

## 2023-09-13 NOTE — Assessment & Plan Note (Signed)
 Continue Crestor and Zetia

## 2023-09-13 NOTE — Assessment & Plan Note (Signed)
-   Improved with tube feeds and free water  flushes - Continue monitoring

## 2023-09-13 NOTE — Assessment & Plan Note (Signed)
-   Tadalafil  on hold due to pressure limitations - Cardiology has discussed RHC with patient and family but due to underlying comorbidities, medical therapy being pursued

## 2023-09-13 NOTE — Assessment & Plan Note (Signed)
-   s/p bipap use and diuresis - now stable on 2L - on 3L chronic O2 at home - May consider oxygen  for comfort

## 2023-09-13 NOTE — Assessment & Plan Note (Addendum)
-   MRI brain 08/26/23: "Small acute posterior right frontal white matter infarct" - Continue Eliquis , Crestor , Zetia  for secondary prevention - Previously ambulated independently at home; lives with brother - Further outpatient follow-up with neurology for cognitive testing

## 2023-09-13 NOTE — Assessment & Plan Note (Signed)
-   no wheezing noted - nebs PRN

## 2023-09-13 NOTE — Assessment & Plan Note (Signed)
-   Updated echo this admission: EF greater than 75%, hyperdynamic function, moderately enlarged RV.  Severely dilated LA.  Severe mitral annular calcification.  No AS.  Bioprosthetic aortic valve -Advanced heart failure team following, currently on Lasix  drip along with metolazone  and Diamox  as necessary -CVP is being monitored and have been downtrending - remains on midodrine ; GDMT limited by hypoTN/cirrhosis - Now transitioning to comfort care after family discussion with cardiology

## 2023-09-13 NOTE — Progress Notes (Signed)
 Occupational Therapy Treatment Patient Details Name: Whitney [name] MRN: [id number] DOB: 1955-08-12 Today's Date: 09/13/2023   History of present illness 68 yo female admitted on 08/25/23 with acute encephalopathy. Transferred to ICU 4/26 for worsening ABG/ increased lethargy.  PMH: NASH cirrhosis, DM2, PAF on eliquis , sjogren's syndrome, severe AS s/p AVR, OSA on CPAP.   OT comments  Pt with confusion today, oriented to self and place.  Follows simple commands with increased time but demonstrates decreased problem sovling and awareness.  Poor carryover with ADL tasks, pt able to wipe mouth but needing total assist to wash hands.  She requires max assist for changing gown.  At bed min guard sitting EOB, but tends to rely on R UE support.  On 4L [place], BP soft but stable, HR 110s-130s.  RN aware of confusion.  Will follow acutely.       If plan is discharge home, recommend the following:  Supervision due to cognitive status;Direct supervision/assist for financial management;Direct supervision/assist for medications management;Assist for transportation;Two people to help with walking and/or transfers;A lot of help with bathing/dressing/bathroom;Assistance with cooking/housework;Assistance with feeding   Equipment Recommendations  Other (comment) (defer)    Recommendations for Other Services Rehab consult    Precautions / Restrictions Precautions Precautions: Fall Recall of Precautions/Restrictions: Impaired Precaution/Restrictions Comments: monitor O2, BP, b/b incontinence; has cor trak and purewick Restrictions Weight Bearing Restrictions Per Provider Order: No       Mobility Bed Mobility Overal bed mobility: Needs Assistance Bed Mobility: Rolling, Sidelying to Sit, Sit to Supine Rolling: Min assist, Used rails Sidelying to sit: Min assist, Mod assist, +2 for safety/equipment   Sit to supine: Max assist, +2 for physical assistance   General bed mobility comments: difficulty sequencing  and attending to tasks, min assist to come to EOB initally but returned to supine as MD asking to assess her.  2nd attempt to EOB increased to mod assist, for trunk, LEs and scooting given incrased time.  max to return supine and scoot to HOB +2.    Transfers                   General transfer comment: deferred due to lethargy and increased confusion     Balance Overall balance assessment: Needs assistance Sitting-balance support: Feet supported, Bilateral upper extremity supported Sitting balance-Leahy Scale: Poor Sitting balance - Comments: progressed to min guard, initally posterior and R lean with heavy preference to R UE support                                   ADL either performed or assessed with clinical judgement   ADL Overall ADL's : Needs assistance/impaired     Grooming: Wash/dry hands;Wash/dry face;Moderate assistance Grooming Details (indicate cue type and reason): able to wipe mouth but total assist to wash hands with no carryover in task         Upper Body Dressing : Maximal assistance;Sitting         Toilet Transfer Details (indicate cue type and reason): deferred                Extremity/Trunk Assessment              Vision       Perception     Praxis     Communication Communication Communication: Impaired Factors Affecting Communication: Difficulty expressing self   Cognition Arousal: Lethargic Behavior During Therapy: Flat affect  Cognition: Cognition impaired   Orientation impairments: Situation, Time Awareness: Intellectual awareness impaired, Online awareness impaired Memory impairment (select all impairments): Short-term memory, Working [employer] fast streamer, Non-declarative long-term memory, Geneticist, molecular long-term memory Attention impairment (select first level of impairment): Focused attention Executive functioning impairment (select all impairments): Problem solving, Sequencing, Initiation, Organization, Reasoning OT  - Cognition Comments: pt confused today, able to state [place] but tends to perseverate on last comment (ie reports her birthday when asked location).  She requires increased time for all tasks, poor intaiton and carryover of tasks.                 Following commands: Impaired Following commands impaired: Follows one step commands inconsistently, Follows one step commands with increased time      Cueing   Cueing Techniques: Verbal cues, Gestural cues, Tactile cues, Visual cues  Exercises      Shoulder Instructions       General Comments BP soft but stable, HR 110s-130s during session. SpO2 stable on 4L    Pertinent Vitals/ Pain       Pain Assessment Pain Assessment: Faces Faces Pain Scale: Hurts little more Pain Location: generalized Pain Descriptors / Indicators: Discomfort, Guarding Pain Intervention(s): Limited activity within patient's tolerance, Monitored during session, Repositioned  Home Living                                          Prior Functioning/Environment              Frequency  Min 2X/week        Progress Toward Goals  OT Goals(current goals can now be found in the care plan section)  Progress towards OT goals: OT to reassess next treatment  Acute Rehab OT Goals Patient Stated Goal: none stated Time For Goal Achievement: 09/17/23 Potential to Achieve Goals: Fair  Plan      Co-evaluation    PT/OT/SLP Co-Evaluation/Treatment: Yes Reason for Co-Treatment: For patient/therapist safety;Necessary to address cognition/behavior during functional activity;To address functional/ADL transfers   OT goals addressed during session: ADL's and self-care      AM-PAC OT "6 Clicks" Daily Activity     Outcome Measure   Help from another person eating meals?: A Lot Help from another person taking care of personal grooming?: A Lot Help from another person toileting, which includes using toliet, bedpan, or urinal?: Total Help  from another person bathing (including washing, rinsing, drying)?: A Lot Help from another person to put on and taking off regular upper body clothing?: A Lot Help from another person to put on and taking off regular lower body clothing?: Total 6 Click Score: 10    End of Session Equipment Utilized During Treatment: Oxygen  (4L)  OT Visit Diagnosis: Unsteadiness on feet (R26.81);Other abnormalities of gait and mobility (R26.89);Other symptoms and signs involving cognitive function   Activity Tolerance Patient limited by lethargy   Patient Left in bed;with call bell/phone within reach;with bed alarm set   Nurse Communication Mobility status;Other (comment);Precautions (confusion)        Time: 1610-9604 OT Time Calculation (min): 31 min  Charges: OT General Charges $OT Visit: 1 Visit OT Treatments $Self Care/Home Management : 8-22 mins  Whitney [name], OT Acute Rehabilitation Services Office [phone] Secure Chat Preferred    Whitney [name] 09/13/2023, 2:21 PM

## 2023-09-13 NOTE — Assessment & Plan Note (Signed)
-   Was noted to be noncompliant on CPAP prior to hospitalization for approximately 3 or more weeks due to broken/missing part.  Was awaiting on home health agency for repair

## 2023-09-13 NOTE — Assessment & Plan Note (Signed)
-   Diagnosed in 2014 and followed at [facility] -Last EGD 2021 with grade 2 esophageal varices - Home nadolol  on hold - Has been on lactulose  and rifaximin

## 2023-09-13 NOTE — Hospital Course (Signed)
 Whitney [name] is a 68 year old female with PMH NASH cirrhosis,HTN, HLD, pulmonary hypertension, Sjogren's syndrome, thrombocytopenia in the setting of cirrhosis, type 2 diabetes and anemia who presented to the ED at [facility] 4/20 for complaints of altered mental status.  Altered mentation was ultimately considered to be multifactorial in setting of respiratory failure along with hepatic encephalopathy.  Significant Hospital Events:  4/20 Admitted with altered mental status being this blood gas with PCO2 69.4 4/26 PCCM consulted ABG with pH 7.29 and PCO261, moved to the intensive care.  Placed on NIPPV had not received diuretics since admission 4/27 Did have a hypoglycemic event requiring dextrose  replacement BNP was 1322 echo cardiogram review shows hyperdynamic LV with grade 3 diastolic dysfunction and elevated LAP bioprosthesis aortic valve in place there was severe TR, severe mitral valve disease and severe PAH with a dilated IVC.,  Limited follow-up echocardiogram continues to show hyperdynamic EF greater than 75% RV moderately enlarged with moderately elevated pulmonary artery systolic pressures, left atrial size dilated estimated right atrial pressure 15 mmHg, 4/28 PICC placed, norepi and later vasopressin  started. Ad HF added empiric ceftriaxone  4/29 still w/ some hypercarbia, stopped dex. Cont lasix  gtt and vasopressor support. Volume status much improved. CXR improved. Delirium an issue. PT ordered. OOB ordered. Resumed Neurontin . 5/1 Remains on NE, Vaso. weaning pressors. Started low dose spironolactone , considering TEE/DCCV prior to dc, seen by rehab team who felt BIPAP on intermittent basis would be barrier but if only needed at HS could be considered candidate  5/2 NE stopped. Changed goal to MAP 65 but advanced HF noting "Her PAH from portopulmonary hypertension may be contributing mildly, but the main driver of her pathology is her hepatic failure" noted that should she fail pressor wean  palliative consult should be considered. Lasix  and spiro stopped 5/3 Stayed off norepi. MAPs >65 Still on vasopressin . Scr up a little. CVP 14-15.  Ammonia level was 80, started lactulose .  Got a dose of Lasix . 5/4 CXR improved s/p diuretics, a little more awake.  Vasopressin  off 5/6 Levophed  off. Afib RVR later in the day with rates up to 130s. 5/8 HR better controlled stable for transfer out of the ICU

## 2023-09-13 NOTE — Assessment & Plan Note (Signed)
-   noted in setting of thrombocytopenia and cirrhosis along with anticoagulation with Eliquis

## 2023-09-13 NOTE — Plan of Care (Signed)
   Problem: Metabolic: Goal: Ability to maintain appropriate glucose levels will improve Outcome: Progressing   Problem: Skin Integrity: Goal: Risk for impaired skin integrity will decrease Outcome: Progressing

## 2023-09-13 NOTE — Progress Notes (Signed)
 Progress Note    Whitney [name]   ZOX:[id number]  DOB: 05-18-1955  DOA: 08/25/2023     19 PCP: Jimmey Mould, MD  Initial CC: Altered mentation  Hospital Course: Whitney [name] is a 68 year old female with PMH NASH cirrhosis,HTN, HLD, pulmonary hypertension, Sjogren's syndrome, thrombocytopenia in the setting of cirrhosis, type 2 diabetes and anemia who presented to the ED at [facility] 4/20 for complaints of altered mental status.  Altered mentation was ultimately considered to be multifactorial in setting of respiratory failure along with hepatic encephalopathy.  Significant Hospital Events:  4/20 Admitted with altered mental status being this blood gas with PCO2 69.4 4/26 PCCM consulted ABG with pH 7.29 and PCO261, moved to the intensive care.  Placed on NIPPV had not received diuretics since admission 4/27 Did have a hypoglycemic event requiring dextrose  replacement BNP was 1322 echo cardiogram review shows hyperdynamic LV with grade 3 diastolic dysfunction and elevated LAP bioprosthesis aortic valve in place there was severe TR, severe mitral valve disease and severe PAH with a dilated IVC.,  Limited follow-up echocardiogram continues to show hyperdynamic EF greater than 75% RV moderately enlarged with moderately elevated pulmonary artery systolic pressures, left atrial size dilated estimated right atrial pressure 15 mmHg, 4/28 PICC placed, norepi and later vasopressin  started. Ad HF added empiric ceftriaxone  4/29 still w/ some hypercarbia, stopped dex. Cont lasix  gtt and vasopressor support. Volume status much improved. CXR improved. Delirium an issue. PT ordered. OOB ordered. Resumed Neurontin . 5/1 Remains on NE, Vaso. weaning pressors. Started low dose spironolactone , considering TEE/DCCV prior to dc, seen by rehab team who felt BIPAP on intermittent basis would be barrier but if only needed at HS could be considered candidate  5/2 NE stopped. Changed goal to MAP 65 but advanced HF noting  "Her PAH from portopulmonary hypertension may be contributing mildly, but the main driver of her pathology is her hepatic failure" noted that should she fail pressor wean palliative consult should be considered. Lasix  and spiro stopped 5/3 Stayed off norepi. MAPs >65 Still on vasopressin . Scr up a little. CVP 14-15.  Ammonia level was 80, started lactulose .  Got a dose of Lasix . 5/4 CXR improved s/p diuretics, a little more awake.  Vasopressin  off 5/6 Levophed  off. Afib RVR later in the day with rates up to 130s. 5/8 HR better controlled stable for transfer out of the ICU  Interval History:  Care taken over today from ICU team.  Son present bedside this morning.  She is awake and alert but still has some confusion although her son Whitney [name] states that this is showing improvement.  Cortrak still in place and has regular diet ordered.  We are hoping oral intake continues to improve as mentation improves.  Assessment and Plan: * Acute hepatic encephalopathy (HCC) - NH3 peaked at 133 and has downtrended with lactulose  use; mentation has reportedly also slowly improved according to her son -Continue lactulose  and rifaximin   Acute on chronic heart failure with preserved ejection fraction (HFpEF) (HCC) - Updated echo this admission: EF greater than 75%, hyperdynamic function, moderately enlarged RV.  Severely dilated LA.  Severe mitral annular calcification.  No AS.  Bioprosthetic aortic valve -Advanced heart failure team following, currently on Lasix  drip along with metolazone  and Diamox  as necessary -CVP is being monitored and have been downtrending - remains on midodrine ; GDMT limited by hypoTN/cirrhosis  Pulmonary hypertension (HCC) - Tadalafil  on hold due to pressure limitations - Cardiology has discussed RHC with patient and family but due  to underlying comorbidities, medical therapy being pursued  Hypernatremia - Improved with tube feeds and free water  flushes - Continue monitoring  AKI  (acute kidney injury) (HCC) - Has been considered a component of hepatorenal +/- CRS - normal renal fxn in April; slowly uptrended and around 1.8 - 2 currently - will have to see if recovers and where stabilizes ultimately - no indication for HD at this time  Acute on chronic respiratory failure with hypoxia and hypercapnia (HCC) - s/p bipap use and diuresis - now stable on 2L - on 3L chronic O2 at home  Severe thrombocytopenia (HCC) - Attributed to underlying cirrhosis.  Evaluated by hematology on 09/12/2023 as well - If platelet >50K, continue apixaban  5 mg twice daily. If platelet 30-50K, apixaban  2.5 mg twice daily. If platelet <30K, hold apixaban . Avoid antiplatelet and NSAIDs - Transfuse platelets for counts <10K or <50 K with active bleeding  - Baseline platelets appear to be in the 40s -70 range.   Liver cirrhosis secondary to NASH [facility]) - Diagnosed in 2014 and followed at [facility] -Last EGD 2021 with grade 2 esophageal varices - Home nadolol  on hold - Continue lactulose  and rifaximin   Paroxysmal atrial fibrillation (HCC) - Developed RVR during hospitalization - Amiodarone  being avoided per cardiology in setting of liver disease - Continue monitoring electrolytes - Continue Eliquis  - Cardiology managing.  Currently on Toprol  for rate control  Inadequate oral intake - Appreciate RD following - Continue cortrak with tube feeds and supplements as recommended - Continue encouraging increasing oral intake.  Patient eating adequately prior to hospitalization per son  Hypokalemia - Replete as needed  At high risk for bleeding - noted in setting of thrombocytopenia and cirrhosis along with anticoagulation with Eliquis   Pressure injury of skin - continue preventative measures per protocol   Moderate persistent asthma - no wheezing noted - nebs PRN  Dog bite - Per previous note, bit on left forearm on 08/20/2023 by her brothers dog (immunized) and discharged home from ER on  Augmentin  - Wound noted to be healing per prior notes and no further need for antibiotics  Generalized weakness - Prolonged hospitalization placing at risk for further physical deconditioning.  Undergoing CIR evaluation - Continue working with PT/OT  DM2 (diabetes mellitus, type 2) (HCC) - A1c 5.1% on 08/26/2023 - Appears to overall be diet controlled prior to hospitalization - Some recent elevation in glucose levels likely due to tube feeds - if sustains can consider starting some sliding scale  History of CVA (cerebrovascular accident) - MRI brain 08/26/23: "Small acute posterior right frontal white matter infarct" - Continue Eliquis , Crestor , Zetia  for secondary prevention - Previously ambulated independently at home; lives with brother - Further outpatient follow-up with neurology for cognitive testing  HLD (hyperlipidemia) - Continue Crestor  and Zetia   Sjogren's syndrome (HCC) - On chronic Plaquenil   OSA (obstructive sleep apnea) - Was noted to be noncompliant on CPAP prior to hospitalization for approximately 3 or more weeks due to broken/missing part.  Was awaiting on home health agency for repair    Old records reviewed in assessment of this patient  Antimicrobials:   DVT prophylaxis:  apixaban  (ELIQUIS ) tablet 2.5 mg Start: 09/12/23 2200 SCDs Start: 08/25/23 1931 apixaban  (ELIQUIS ) tablet 2.5 mg   Code Status:   Code Status: Full Code  Mobility Assessment (Last 72 Hours)     Mobility Assessment     Row Name 09/13/23 0228 09/11/23 2000 09/11/23 1300 09/10/23 2000 09/10/23 1600   Does patient have  an order for bedrest or is patient medically unstable No - Continue assessment No - Continue assessment -- No - Continue assessment No - Continue assessment   What is the highest level of mobility based on the progressive mobility assessment? Level 2 (Chairfast) - Balance while sitting on edge of bed and cannot stand Level 2 (Chairfast) - Balance while sitting on edge of  bed and cannot stand Level 2 (Chairfast) - Balance while sitting on edge of bed and cannot stand Level 2 (Chairfast) - Balance while sitting on edge of bed and cannot stand Level 2 (Chairfast) - Balance while sitting on edge of bed and cannot stand   Is the above level different from baseline mobility prior to current illness? -- -- -- -- Yes - Recommend PT order            Barriers to discharge: None Disposition Plan: To be determined HH orders placed: N/A Status is: Inpatient  Objective: Blood pressure 92/79, pulse (!) 103, temperature 98.7 F (37.1 C), temperature source Oral, resp. rate 20, height 5' 3.75" (1.619 m), weight 101.2 kg, SpO2 90%.  Examination:  Physical Exam Constitutional:      General: She is not in acute distress.    Appearance: Normal appearance.  HENT:     Head: Normocephalic and atraumatic.     Mouth/Throat:     Mouth: Mucous membranes are moist.  Eyes:     Extraocular Movements: Extraocular movements intact.  Cardiovascular:     Rate and Rhythm: Tachycardia present. Rhythm irregular.  Pulmonary:     Effort: Pulmonary effort is normal. No respiratory distress.     Breath sounds: Normal breath sounds. No wheezing.  Abdominal:     General: Bowel sounds are normal. There is no distension.     Palpations: Abdomen is soft.     Tenderness: There is no abdominal tenderness.  Musculoskeletal:        General: Normal range of motion.     Cervical back: Normal range of motion and neck supple.  Skin:    General: Skin is warm and dry.  Neurological:     Mental Status: She is alert. She is disoriented.     Comments: Oriented to name, president.  Unable to state year or where she is  Psychiatric:        Mood and Affect: Mood normal.      Consultants:  Advanced heart failure team  Procedures:    Data Reviewed: Results for orders placed or performed during the hospital encounter of 08/25/23 (from the past 24 hours)  Glucose, capillary     Status:  Abnormal   Collection Time: 09/12/23  3:18 PM  Result Value Ref Range   Glucose-Capillary 181 (H) 70 - 99 mg/dL  Glucose, capillary     Status: Abnormal   Collection Time: 09/12/23  9:20 PM  Result Value Ref Range   Glucose-Capillary 187 (H) 70 - 99 mg/dL  Basic metabolic panel     Status: Abnormal   Collection Time: 09/12/23  9:40 PM  Result Value Ref Range   Sodium 140 135 - 145 mmol/L   Potassium 3.5 3.5 - 5.1 mmol/L   Chloride 96 (L) 98 - 111 mmol/L   CO2 36 (H) 22 - 32 mmol/L   Glucose, Bld 237 (H) 70 - 99 mg/dL   BUN 57 (H) 8 - 23 mg/dL   Creatinine, Ser 0.86 (H) 0.44 - 1.00 mg/dL   Calcium  8.9 8.9 - 10.3 mg/dL   GFR,  Estimated 25 (L) >60 mL/min   Anion gap 8 5 - 15  Magnesium      Status: None   Collection Time: 09/12/23  9:40 PM  Result Value Ref Range   Magnesium  2.1 1.7 - 2.4 mg/dL  Basic metabolic panel     Status: Abnormal   Collection Time: 09/13/23  3:00 AM  Result Value Ref Range   Sodium 140 135 - 145 mmol/L   Potassium 3.2 (L) 3.5 - 5.1 mmol/L   Chloride 94 (L) 98 - 111 mmol/L   CO2 37 (H) 22 - 32 mmol/L   Glucose, Bld 202 (H) 70 - 99 mg/dL   BUN 56 (H) 8 - 23 mg/dL   Creatinine, Ser 1.30 (H) 0.44 - 1.00 mg/dL   Calcium  9.0 8.9 - 10.3 mg/dL   GFR, Estimated 28 (L) >60 mL/min   Anion gap 9 5 - 15  Glucose, capillary     Status: Abnormal   Collection Time: 09/13/23  6:17 AM  Result Value Ref Range   Glucose-Capillary 164 (H) 70 - 99 mg/dL   Comment 1 Notify RN   Basic metabolic panel     Status: Abnormal   Collection Time: 09/13/23  9:09 AM  Result Value Ref Range   Sodium 141 135 - 145 mmol/L   Potassium 3.3 (L) 3.5 - 5.1 mmol/L   Chloride 93 (L) 98 - 111 mmol/L   CO2 39 (H) 22 - 32 mmol/L   Glucose, Bld 188 (H) 70 - 99 mg/dL   BUN 54 (H) 8 - 23 mg/dL   Creatinine, Ser 8.65 (H) 0.44 - 1.00 mg/dL   Calcium  8.9 8.9 - 10.3 mg/dL   GFR, Estimated 26 (L) >60 mL/min   Anion gap 9 5 - 15  Magnesium      Status: None   Collection Time: 09/13/23  9:09 AM   Result Value Ref Range   Magnesium  2.2 1.7 - 2.4 mg/dL  Glucose, capillary     Status: Abnormal   Collection Time: 09/13/23 11:40 AM  Result Value Ref Range   Glucose-Capillary 138 (H) 70 - 99 mg/dL    I have reviewed pertinent nursing notes, vitals, labs, and images as necessary. I have ordered labwork to follow up on as indicated.  I have reviewed the last notes from staff over past 24 hours. I have discussed patient's care plan and test results with nursing staff, CM/SW, and other staff as appropriate.  Time spent: Greater than 50% of the 55 minute visit was spent in counseling/coordination of care for the patient as laid out in the A&P.   LOS: 19 days   Faith Homes, MD [facility] 09/13/2023, 12:49 PM

## 2023-09-13 NOTE — TOC Progression Note (Signed)
 Transition of Care [facility]) - Progression Note    Patient Details  Name: Whitney [name] MRN: [id number] Date of Birth: 31-Oct-1955  Transition of Care [facility]) CM/SW Contact  Ernst Heap Phone Number: [phone] 09/13/2023, 8:57 AM  Clinical Narrative:   HF CSW/NCM will continue to monitor and follow for medical readiness and dc needs.   TOC will continue following.     Expected Discharge Plan: IP Rehab Facility Barriers to Discharge: Continued Medical Work up  Expected Discharge Plan and Services   Discharge Planning Services: CM Consult Post Acute Care Choice: Home Health Living arrangements for the past 2 months: Single Family Home                 DME Arranged: Walker rolling, NIV DME Agency: [employer] Date DME Agency Contacted: 09/06/23 Time DME Agency Contacted: [id number] Representative spoke with at DME Agency: Mitch HH Arranged: PT, OT, Nurse's Aide, RN HH Agency: [facility] Date [facility] Agency Contacted: 08/28/23   Representative spoke with at [facility] Agency: Randel Buss   Social Determinants of Health (SDOH) Interventions SDOH Screenings   Food Insecurity: No Food Insecurity (08/27/2023)  Housing: Low Risk  (09/03/2023)  Transportation Needs: No Transportation Needs (08/27/2023)  Utilities: Not At Risk (08/27/2023)  Depression (PHQ2-9): Low Risk  (09/20/2020)  Recent Concern: Depression (PHQ2-9) - Medium Risk (07/11/2020)  Social Connections: Unknown (08/27/2023)  Tobacco Use: Low Risk  (08/25/2023)    Readmission Risk Interventions     No data to display

## 2023-09-14 DIAGNOSIS — I272 Pulmonary hypertension, unspecified: Secondary | ICD-10-CM | POA: Diagnosis not present

## 2023-09-14 DIAGNOSIS — K7682 Hepatic encephalopathy: Secondary | ICD-10-CM

## 2023-09-14 DIAGNOSIS — Z7189 Other specified counseling: Secondary | ICD-10-CM | POA: Diagnosis not present

## 2023-09-14 DIAGNOSIS — Z7901 Long term (current) use of anticoagulants: Secondary | ICD-10-CM

## 2023-09-14 DIAGNOSIS — I5033 Acute on chronic diastolic (congestive) heart failure: Secondary | ICD-10-CM | POA: Diagnosis not present

## 2023-09-14 DIAGNOSIS — K7581 Nonalcoholic steatohepatitis (NASH): Secondary | ICD-10-CM | POA: Diagnosis not present

## 2023-09-14 DIAGNOSIS — D696 Thrombocytopenia, unspecified: Secondary | ICD-10-CM | POA: Diagnosis not present

## 2023-09-14 LAB — CBC WITH DIFFERENTIAL/PLATELET
Abs Immature Granulocytes: 0.02 10*3/uL (ref 0.00–0.07)
Basophils Absolute: 0 10*3/uL (ref 0.0–0.1)
Basophils Relative: 0 %
Eosinophils Absolute: 0.3 10*3/uL (ref 0.0–0.5)
Eosinophils Relative: 4 %
HCT: 42.2 % (ref 36.0–46.0)
Hemoglobin: 12.7 g/dL (ref 12.0–15.0)
Immature Granulocytes: 0 %
Lymphocytes Relative: 18 %
Lymphs Abs: 1.2 10*3/uL (ref 0.7–4.0)
MCH: 30.1 pg (ref 26.0–34.0)
MCHC: 30.1 g/dL (ref 30.0–36.0)
MCV: 100 fL (ref 80.0–100.0)
Monocytes Absolute: 1 10*3/uL (ref 0.1–1.0)
Monocytes Relative: 15 %
Neutro Abs: 4.2 10*3/uL (ref 1.7–7.7)
Neutrophils Relative %: 63 %
Platelets: 32 10*3/uL — ABNORMAL LOW (ref 150–400)
RBC: 4.22 MIL/uL (ref 3.87–5.11)
RDW: 18 % — ABNORMAL HIGH (ref 11.5–15.5)
WBC: 6.8 10*3/uL (ref 4.0–10.5)
nRBC: 0 % (ref 0.0–0.2)

## 2023-09-14 LAB — COMPREHENSIVE METABOLIC PANEL WITH GFR
ALT: 13 U/L (ref 0–44)
AST: 49 U/L — ABNORMAL HIGH (ref 15–41)
Albumin: 2.5 g/dL — ABNORMAL LOW (ref 3.5–5.0)
Alkaline Phosphatase: 79 U/L (ref 38–126)
Anion gap: 10 (ref 5–15)
BUN: 60 mg/dL — ABNORMAL HIGH (ref 8–23)
CO2: 38 mmol/L — ABNORMAL HIGH (ref 22–32)
Calcium: 8.6 mg/dL — ABNORMAL LOW (ref 8.9–10.3)
Chloride: 91 mmol/L — ABNORMAL LOW (ref 98–111)
Creatinine, Ser: 2.15 mg/dL — ABNORMAL HIGH (ref 0.44–1.00)
GFR, Estimated: 24 mL/min — ABNORMAL LOW (ref >60)
Glucose, Bld: 235 mg/dL — ABNORMAL HIGH (ref 70–99)
Potassium: 3.3 mmol/L — ABNORMAL LOW (ref 3.5–5.1)
Sodium: 139 mmol/L (ref 135–145)
Total Bilirubin: 1.9 mg/dL — ABNORMAL HIGH (ref 0.0–1.2)
Total Protein: 5.6 g/dL — ABNORMAL LOW (ref 6.5–8.1)

## 2023-09-14 LAB — GLUCOSE, CAPILLARY
Glucose-Capillary: 148 mg/dL — ABNORMAL HIGH (ref 70–99)
Glucose-Capillary: 166 mg/dL — ABNORMAL HIGH (ref 70–99)

## 2023-09-14 LAB — MAGNESIUM: Magnesium: 2.2 mg/dL (ref 1.7–2.4)

## 2023-09-14 LAB — AMMONIA: Ammonia: 75 umol/L — ABNORMAL HIGH (ref 9–35)

## 2023-09-14 MED ORDER — HALOPERIDOL 1 MG PO TABS: 2.0000 mg | ORAL_TABLET | Freq: Four times a day (QID) | ORAL | Status: DC | PRN

## 2023-09-14 MED ORDER — LORAZEPAM 1 MG PO TABS: 1.0000 mg | ORAL_TABLET | ORAL | Status: DC | PRN

## 2023-09-14 MED ORDER — LORAZEPAM 2 MG/ML IJ SOLN: 1.0000 mg | INTRAMUSCULAR | Status: DC | PRN

## 2023-09-14 MED ORDER — LACTULOSE 10 GM/15ML PO SOLN: 30.0000 g | Freq: Four times a day (QID) | ORAL | Status: DC

## 2023-09-14 MED ORDER — HALOPERIDOL LACTATE 5 MG/ML IJ SOLN: 2.0000 mg | INTRAMUSCULAR | Status: DC | PRN

## 2023-09-14 MED ORDER — GLYCOPYRROLATE 0.2 MG/ML IJ SOLN: 0.2000 mg | INTRAMUSCULAR | Status: DC | PRN

## 2023-09-14 MED ORDER — ACETAMINOPHEN 650 MG RE SUPP: 650.0000 mg | Freq: Four times a day (QID) | RECTAL | Status: DC | PRN

## 2023-09-14 MED ORDER — SODIUM CHLORIDE 0.9 % IV SOLN
0.5000 mg/h | INTRAVENOUS | Status: DC
  Administered 2023-09-14: 0.5 mg/h via INTRAVENOUS
  Filled 2023-09-14: qty 5

## 2023-09-14 MED ORDER — ACETAZOLAMIDE SODIUM 500 MG IJ SOLR
500.0000 mg | Freq: Once | INTRAMUSCULAR | Status: DC
  Filled 2023-09-14: qty 500

## 2023-09-14 MED ORDER — HALOPERIDOL LACTATE 2 MG/ML PO CONC: 2.0000 mg | Freq: Four times a day (QID) | ORAL | Status: DC | PRN

## 2023-09-14 MED ORDER — LACTULOSE 10 GM/15ML PO SOLN: 30.0000 g | Freq: Three times a day (TID) | ORAL | Status: DC

## 2023-09-14 MED ORDER — ONDANSETRON HCL 4 MG/2ML IJ SOLN
4.0000 mg | Freq: Four times a day (QID) | INTRAMUSCULAR | Status: DC | PRN
Start: 2023-09-14 — End: 2023-09-15

## 2023-09-14 MED ORDER — ACETAMINOPHEN 325 MG PO TABS: 650.0000 mg | ORAL_TABLET | Freq: Four times a day (QID) | ORAL | Status: DC | PRN

## 2023-09-14 MED ORDER — BIOTENE DRY MOUTH MT LIQD: 15.0000 mL | Freq: Two times a day (BID) | OROMUCOSAL | Status: DC

## 2023-09-14 MED ORDER — METOPROLOL TARTRATE 25 MG PO TABS: 25.0000 mg | ORAL_TABLET | Freq: Four times a day (QID) | ORAL | Status: DC

## 2023-09-14 MED ORDER — GLYCOPYRROLATE 1 MG PO TABS: 1.0000 mg | ORAL_TABLET | ORAL | Status: DC | PRN

## 2023-09-14 MED ORDER — POLYVINYL ALCOHOL 1.4 % OP SOLN: 1.0000 [drp] | Freq: Four times a day (QID) | OPHTHALMIC | Status: DC | PRN

## 2023-09-14 MED ORDER — LORAZEPAM 2 MG/ML PO CONC: 1.0000 mg | ORAL | Status: DC | PRN

## 2023-09-14 MED ORDER — METOPROLOL TARTRATE 5 MG/5ML IV SOLN: 2.5000 mg | INTRAVENOUS | Status: DC

## 2023-09-14 MED ORDER — ONDANSETRON 4 MG PO TBDP: 4.0000 mg | ORAL_TABLET | Freq: Four times a day (QID) | ORAL | Status: DC | PRN

## 2023-09-14 MED ORDER — ALUM & MAG HYDROXIDE-SIMETH 200-200-20 MG/5ML PO SUSP: 15.0000 mL | Freq: Four times a day (QID) | ORAL | Status: DC | PRN

## 2023-09-14 MED ORDER — DIPHENHYDRAMINE HCL 50 MG/ML IJ SOLN: 25.0000 mg | INTRAMUSCULAR | Status: DC | PRN

## 2023-09-14 MED ORDER — POTASSIUM CHLORIDE 10 MEQ/50ML IV SOLN
10.0000 meq | INTRAVENOUS | Status: DC
Start: 2023-09-14 — End: 2023-09-14

## 2023-09-14 MED ORDER — HYDROMORPHONE HCL 1 MG/ML IJ SOLN
1.0000 mg | Freq: Once | INTRAMUSCULAR | Status: AC
  Administered 2023-09-14: 1 mg via INTRAVENOUS
  Filled 2023-09-14: qty 1

## 2023-09-14 MED ORDER — HYDROMORPHONE BOLUS VIA INFUSION: 0.2500 mg | INTRAVENOUS | Status: DC | PRN

## 2023-09-14 MED ORDER — HYDROMORPHONE HCL-NACL 50-0.9 MG/50ML-% IV SOLN: 0.5000 mg/h | INTRAVENOUS | Status: DC

## 2023-09-14 NOTE — Progress Notes (Addendum)
 Daily Progress Note   Patient Name: Whitney [name]       Date: 09/14/2023 DOB: 1955-12-31  Age: 68 y.o. MRN#: [id number] Attending Physician: Faith Homes, MD Primary Care Physician: Jimmey Mould, MD Admit Date: 08/25/2023  Reason for Consultation/Follow-up: Establishing goals of care  Length of Stay: 20  Current Medications: Scheduled Meds:   antiseptic oral rinse  15 mL Topical BID   Chlorhexidine  Gluconate Cloth  6 each Topical Q0600   Gerhardt's butt cream   Topical TID   lidocaine   1 patch Transdermal Q24H   revefenacin   175 mcg Nebulization Daily   sodium chloride  flush  10-40 mL Intracatheter Q12H    Continuous Infusions:  furosemide  (LASIX ) 200 mg in dextrose  5 % 100 mL (2 mg/mL) infusion 30 mg/hr (09/14/23 0432)   HYDROmorphone       PRN Meds: acetaminophen  **OR** acetaminophen , albuterol , alum & mag hydroxide-simeth, antiseptic oral rinse, diphenhydrAMINE , glycopyrrolate **OR** glycopyrrolate **OR** glycopyrrolate, haloperidol **OR** haloperidol **OR** haloperidol lactate, HYDROmorphone , lip balm, LORazepam **OR** LORazepam **OR** LORazepam, ondansetron  **OR** ondansetron  (ZOFRAN ) IV, mouth rinse, polyvinyl alcohol, sodium chloride  flush  Physical Exam Vitals reviewed.  Constitutional:      General: She is sleeping. She is not in acute distress.    Appearance: She is ill-appearing.     Interventions: Nasal cannula in place.     Comments: cortrack  Cardiovascular:     Rate and Rhythm: Tachycardia present.  Pulmonary:     Effort: Pulmonary effort is normal.  Skin:    Coloration: Skin is jaundiced.  Neurological:     Mental Status: She is lethargic.             Vital Signs: BP 110/86 (BP Location: Left Arm)   Pulse (!) 129   Temp 97.9 F (36.6 C) (Oral)    Resp 20   Ht 5' 3.75" (1.619 m)   Wt 100.9 kg   SpO2 93%   BMI 38.48 kg/m  SpO2: SpO2: 93 % O2 Device: O2 Device: Nasal Cannula O2 Flow Rate: O2 Flow Rate (L/min): 3 L/min     Patient Active Problem List   Diagnosis Date Noted   Chronic anticoagulation 09/14/2023   Acute on chronic respiratory failure with hypoxia and hypercapnia (HCC) 09/13/2023   AKI (acute kidney injury) (HCC) 09/13/2023   Hypernatremia 09/13/2023  Inadequate oral intake 09/13/2023   Hypokalemia 09/13/2023   At high risk for bleeding 09/12/2023   Acute on chronic heart failure with preserved ejection fraction (HFpEF) (HCC) 09/09/2023   Pressure injury of skin 09/09/2023   Altered mental status 08/26/2023   Acute hepatic encephalopathy (HCC) 08/25/2023   Generalized weakness 08/25/2023   Dog bite 08/25/2023   Moderate persistent asthma 08/25/2023   Acute diastolic heart failure (HCC) 04/07/2023   Peripheral edema 04/07/2023   Acute hypoxemic respiratory failure (HCC) 03/15/2023   Chronic hypoxic respiratory failure (HCC) 03/14/2023   DM2 (diabetes mellitus, type 2) (HCC) 03/14/2023   Elevated troponin 03/14/2023   COPD with acute exacerbation (HCC) 03/12/2023   Acute respiratory failure (HCC) 03/12/2023   Elevated diaphragm 03/12/2023   History of TIA (transient ischemic attack) 03/12/2023   Hypercoagulable state due to paroxysmal atrial fibrillation (HCC) 05/08/2022   History of CVA (cerebrovascular accident) 04/13/2022   COPD (chronic obstructive pulmonary disease) (HCC) 01/18/2021   Pulmonary hypertension (HCC) 06/15/2019   Dyspnea 01/06/2019   Paroxysmal atrial fibrillation (HCC) 01/24/2018   S/P AVR 12/05/2017   Severe thrombocytopenia (HCC) 11/13/2017   Other neutropenia (HCC) 11/13/2017   Numbness in feet 10/14/2017   Severe aortic stenosis    Inguinal hernia 10/22/2016   Obesity (BMI 30.0-34.9) 04/26/2016   Lower extremity edema 06/22/2015   Status post bariatric surgery 03/29/2014    Aortic valve disorder 04/21/2013   Anasarca 04/21/2013   Depression    OSA (obstructive sleep apnea)    Sjogren's syndrome (HCC)    Liver cirrhosis secondary to NASH (HCC)    HLD (hyperlipidemia)    Fibromyalgia    Postmenopausal bleeding 12/11/2012   Endometrial polyp 12/11/2012   NASH (nonalcoholic steatohepatitis) 08/06/2012   Chronic cough 04/11/2011    Palliative Care Assessment & Plan   Patient Profile: 68 y.o. female  with past medical history of NASH cirrhosis, pulmonary hypertension, sjogren's syndrome, DMII, paroxysmal a-fib on Eliquis , TIA, and anemia admitted on 08/25/2023 with altered mental status, acute on chronic respiratory failure and AKI. Required vasopressor support in ICU.    Patient's family face treatment option decisions, advanced directive decisions, and anticipatory care needs.  Today's Discussion: Received update from Dr. Mitzie Anda. Patient's family shared they had received "bad news" from Dr. Mitzie Anda.   Family asks that we step out of the room to talk. Patient's daughter Landa Pine, son Dorothea Gata, and granddaughter meet with me in the family room. They share their understanding of her acute illness. Despite aggressive medical management the patient has not had significant improvement and seems to have worsened over the last day or two. They understand that the patient continues to have heart failure, worsening kidney failure, and liver failure. They feel as if the patient is suffering as she began having generalized pain yesterday. They do not want to prolong her suffering and want to discuss what comfort measures would look like. We discuss that once transitioned to comfort measures the patient would no longer receive aggressive medical interventions such as continuous vital signs, lab work, radiology testing, or medications not focused on comfort. All care would focus on how the patient is looking and feeling. This would include management of any symptoms that may cause  discomfort, pain, shortness of breath, cough, nausea, agitation, anxiety, and/or secretions etc. Symptoms would be managed with medications and other non-pharmacological interventions. Family verbalized understanding and appreciation. They would like to start comfort measures. They are in agreement with starting a continuous dilaudid  infusion to provide pain  relief.  We discussed hospice services. The goal of hospice is the preservation of dignity and quality at the end phases of life. Under hospice care, the focus changes from curative to symptom relief. I explained the three settings where hospice services can be provided including the home, at a living facility (such as LTC SNF, Assisted Living, etc), and a hospice facility. I explained that acceptance to hospice in any specific location is the final decision of the hospice medical director and bed availability, if applicable. They would like the patient to be evaluated for inpatient hospice at [facility]. We discussed this process and TOC order was placed.  Emotional support provided. Questions answered. Encouraged family to call PMT with any questions or concerns. PMT will continue to support.  1700: Patient accepted to [facility]. Plan is for transfer tonight.  Recommendations/Plan: Changed to DNR Comfort measures only Hopeful that patient will be accepted to inpatient hospice at [facility]- TOC order placed and [facility] liaison notified Comfort medications per [facility] Continued PMT support   Comfort orders: Dilaudid  continuous infusion and PRN dosing for pain/air hunger/comfort Robinul PRN for excessive secretions Ativan PRN for agitation/anxiety Zofran  PRN for nausea Liquifilm tears PRN for dry eyes Haldol PRN for agitation/anxiety May have comfort feeding Unrestricted visitations in the setting of EOL (per policy) Oxygen  PRN 2L or less for comfort. No escalation.   Code Status:    Code Status Orders  (From admission, onward)            Start     Ordered   09/14/23 1202  Do not attempt resuscitation (DNR) - Comfort care  Continuous       Question Answer Comment  If patient has no pulse and is not breathing Do Not Attempt Resuscitation   In Pre-Arrest Conditions (Patient Is Breathing and Has a Pulse) Provide comfort measures. Relieve any mechanical airway obstruction. Avoid transfer unless required for comfort.   Consent: Discussion documented in EHR or advanced directives reviewed      09/14/23 1210         Extensive chart review has been completed prior to seeing the patient including labs, vital signs, imaging, progress/consult notes, orders, medications, and available advance directive documents.  Care plan was discussed with Dr. Mitzie Anda and bedside RN  Time spent: 90 minutes  Thank you for allowing the Palliative Medicine Team to assist in the care of this patient.     Daina Drum, NP  Please contact Palliative Medicine Team phone at [phone] for questions and concerns.

## 2023-09-14 NOTE — Progress Notes (Signed)
 Whitney [name]   DOB:[birth year]   ZO#:[id number]    ASSESSMENT & PLAN:  Whitney [name] is a 68 year old female patient with history of NASH cirrhosis, varices, HTN, HLD, pulmonary hypertension, Sjogren's syndrome, thrombocytopenia in the setting of cirrhosis, type 2 diabetes who was admitted on 08/25/2023 with complaint of altered mental status.   Her records revealed long standing cirrhosis, chronic thrombocytopenia dated over 10 years. Her thrombocytopenia worsened in acute setting by historical records. Ammonia was >100 yesterday. No bleeding reported.  Supportive measures. Transfusion as below. Assessment & Plan Severe thrombocytopenia (HCC) Transfuse platelets for counts <10K or <50 K with active bleeding  Monitor for bleeding Acute hepatic encephalopathy (HCC) Hepatic encephalopathy with underlying cirrhosis Liver cirrhosis secondary to NASH (HCC) Cause of chronic thrombocytopenia. Chronic anticoagulation If platelet >50K, continue apixaban  5 mg twice daily If platelet 30-50K, apixaban  2.5 mg twice daily. If platelet <30K, hold apixaban . Avoid antiplatelet and NSAIDs. At high risk for bleeding As above  Thank you for the consult.  Lowanda Ruddy, MD 09/14/2023 7:45 AM  Subjective:  Whitney [name] was sleeping. No report of bleeding, bloody stool or urine.  Objective:  Vitals:   09/14/23 0300 09/14/23 0700  BP: 99/71 100/69  Pulse: (!) 126 (!) 129  Resp: 17 17  Temp: 98.6 F (37 C) 97.9 F (36.6 C)  SpO2: 97% 94%     Intake/Output Summary (Last 24 hours) at 09/14/2023 0745 Last data filed at 09/14/2023 0400 Gross per 24 hour  Intake 120 ml  Output 950 ml  Net -830 ml    GENERAL: asleep, arousable, no acute distress  SKIN: skin color normal. No petechiae LUNGS:  normal breathing effort.  HEART: tachycardic ABDOMEN: abdomen soft, non-tender and obese Musculoskeletal: bilateral lower extremity edema   Labs:  Recent Labs    05/20/23 1251 07/12/23 0944 09/09/23 0500  09/10/23 0436 09/12/23 0430 09/12/23 2140 09/13/23 0300 09/13/23 0909 09/14/23 0500  NA  --    < > 145   < > 143   < > 140 141 139  K  --    < > 3.5   < > 2.8*   < > 3.2* 3.3* 3.3*  CL  --    < > 101   < > 100   < > 94* 93* 91*  CO2  --    < > 33*   < > 36*   < > 37* 39* 38*  GLUCOSE  --    < > 116*   < > 195*   < > 202* 188* 235*  BUN  --    < > 48*   < > 50*   < > 56* 54* 60*  CREATININE  --    < > 1.84*   < > 1.77*   < > 1.92* 2.03* 2.15*  CALCIUM   --    < > 9.5   < > 9.3   < > 9.0 8.9 8.6*  GFRNONAA  --    < > 29*   < > 31*   < > 28* 26* 24*  PROT 6.1   < > 6.2*  --  6.0*  --   --   --  5.6*  ALBUMIN  3.1*   < > 2.8*  --  2.6*  --   --   --  2.5*  AST 28   < > 70*  --  59*  --   --   --  49*  ALT 12   < >  17  --  17  --   --   --  13  ALKPHOS 62   < > 72  --  81  --   --   --  79  BILITOT 1.2   < > 2.3*  --  2.3*  --   --   --  1.9*  BILIDIR 0.46*  --   --   --   --   --   --   --   --    < > = values in this interval not displayed.    Studies:  DG Abd Portable 1V Result Date: 09/08/2023 CLINICAL DATA:  NG tube placement EXAM: PORTABLE ABDOMEN - 1 VIEW COMPARISON:  01/18/2015 FINDINGS: NG tube coils in the stomach. Mild gaseous distention of the visualized bowel in the upper abdomen. IMPRESSION: NG tube coils in the stomach. Electronically Signed   By: Janeece Mechanic M.D.   On: 09/08/2023 18:15   DG Chest Port 1 View Result Date: 09/08/2023 CLINICAL DATA:  [id number] Pulmonary edema [id number] EXAM: PORTABLE CHEST - 1 VIEW COMPARISON:  09/07/2023. FINDINGS: Cardiac silhouette is prominent. There is pulmonary interstitial prominence with vascular congestion. No focal consolidation. No pneumothorax or pleural effusion identified. Aorta is calcified. There are median sternotomy wires. Prosthetic aortic valve. Implanted cardiac monitor. Right-sided PICC tip mid SVC. IMPRESSION: Findings suggest CHF. Electronically Signed   By: Sydell Eva M.D.   On: 09/08/2023 08:52   DG Chest Port 1  View Result Date: 09/07/2023 CLINICAL DATA:  Pulmonary edema. EXAM: PORTABLE CHEST 1 VIEW COMPARISON:  One-view chest x-ray 09/03/2023 FINDINGS: The heart is enlarged. Aortic valve replacement is noted. Number cord noted. Right PICC line terminates just above the cavoatrial junction. Lung volumes are low. Increasing interstitial edema and bilateral pleural effusions is noted. Bibasilar airspace opacities likely reflect atelectasis. IMPRESSION: 1. Increasing interstitial edema and bilateral pleural effusions compatible with progressive congestive heart failure. 2. Bibasilar airspace opacities likely reflect atelectasis. Electronically Signed   By: Audree Leas M.D.   On: 09/07/2023 12:00   DG CHEST PORT 1 VIEW Result Date: 09/03/2023 CLINICAL DATA:  Respiratory failure EXAM: PORTABLE CHEST 1 VIEW COMPARISON:  09/02/2023 FINDINGS: Cardiac shadow is enlarged but stable. Postsurgical changes are noted. Right PICC is now seen in satisfactory position. The lungs are well aerated bilaterally. Prominent central vascular congestion is noted. Mild right basilar atelectasis is noted. IMPRESSION: Mild vascular congestion with right basilar atelectasis. Electronically Signed   By: Violeta Grey M.D.   On: 09/03/2023 08:29   DG CHEST PORT 1 VIEW Result Date: 09/02/2023 CLINICAL DATA:  Hypoxia. EXAM: PORTABLE CHEST 1 VIEW COMPARISON:  Chest radiograph dated 08/31/2023 FINDINGS: There is cardiomegaly with vascular congestion. Small bilateral pleural effusions and bibasilar atelectasis or infiltrate, progressed since the prior radiograph. No pneumothorax. Median sternotomy wires. No acute osseous pathology. IMPRESSION: 1. Cardiomegaly with vascular congestion. 2. Small bilateral pleural effusions and bibasilar atelectasis or infiltrate. Electronically Signed   By: Angus Bark M.D.   On: 09/02/2023 11:48   US  EKG SITE RITE Result Date: 09/01/2023 If Site Rite image not attached, placement could not be confirmed  due to current cardiac rhythm.  ECHOCARDIOGRAM LIMITED Result Date: 09/01/2023    ECHOCARDIOGRAM LIMITED REPORT   Patient Name:   Whitney [name] Date of Exam: 09/01/2023 Medical Rec #:  [id number]      Height:       63.7 in Accession #:    [id number]     Weight:  244.9 lb Date of Birth:  April 17, 1956       BSA:          2.126 m Patient Age:    68 years       BP:           88/56 mmHg Patient Gender: F              HR:           58 bpm. Exam Location:  Inpatient Procedure: Limited Echo, Cardiac Doppler and Color Doppler (Both Spectral and            Color Flow Doppler were utilized during procedure). Indications:   PHTN I27.2  History:       Patient has prior history of Echocardiogram examinations.                Aortic Valve: bioprosthetic valve is present in the aortic                position.  Sonographer:   Hersey Lorenzo RDCS Referring      [phone] [facility] Phys: IMPRESSIONS  1. Left ventricular ejection fraction, by estimation, is >75%. The left ventricle has hyperdynamic function.  2. The right ventricular size is moderately enlarged. There is moderately elevated pulmonary artery systolic pressure.  3. Left atrial size was severely dilated.  4. Severe mitral annular calcification.  5. The aortic valve has been repaired/replaced. There is moderate calcification of the aortic valve. There is moderate thickening of the aortic valve. Aortic valve sclerosis is present, with no evidence of aortic valve stenosis. There is a bioprosthetic  valve present in the aortic position.  6. The inferior vena cava is dilated in size with <50% respiratory variability, suggesting right atrial pressure of 15 mmHg. FINDINGS  Left Ventricle: Left ventricular ejection fraction, by estimation, is >75%. The left ventricle has hyperdynamic function. Right Ventricle: The right ventricular size is moderately enlarged. There is moderately elevated pulmonary artery systolic pressure. The tricuspid regurgitant velocity is 3.02 m/s, and  with an assumed right atrial pressure of 15 mmHg, the estimated right  ventricular systolic pressure is 51.5 mmHg. Left Atrium: Left atrial size was severely dilated. Mitral Valve: Severe mitral annular calcification. Tricuspid Valve: The tricuspid valve is normal in structure. Tricuspid valve regurgitation is mild. Aortic Valve: The aortic valve has been repaired/replaced. There is moderate calcification of the aortic valve. There is moderate thickening of the aortic valve. Aortic valve sclerosis is present, with no evidence of aortic valve stenosis. There is a bioprosthetic valve present in the aortic position. Venous: The inferior vena cava is dilated in size with less than 50% respiratory variability, suggesting right atrial pressure of 15 mmHg. LEFT VENTRICLE PLAX 2D LVIDd:         5.20 cm LVIDs:         2.10 cm LV PW:         1.00 cm LV IVS:        1.00 cm  RIGHT VENTRICLE             IVC RV S prime:     10.60 cm/s  IVC diam: 2.40 cm TAPSE (M-mode): 2.5 cm LEFT ATRIUM         Index       RIGHT ATRIUM           Index LA diam:    5.10 cm 2.40 cm/m  RA Area:     19.20 cm  RA Volume:   57.00 ml  26.81 ml/m  AORTIC VALVE LVOT Vmax:   116.00 cm/s LVOT Vmean:  77.600 cm/s LVOT VTI:    0.366 m MITRAL VALVE                TRICUSPID VALVE MV Area (PHT): 1.94 cm     TR Peak grad:   36.5 mmHg MV Decel Time: 392 msec     TR Vmax:        302.00 cm/s MV E velocity: 157.00 cm/s MV A velocity: 106.00 cm/s  SHUNTS MV E/A ratio:  1.48         Systemic VTI: 0.37 m Dorothye Gathers MD Electronically signed by Dorothye Gathers MD Signature Date/Time: 09/01/2023/2:15:19 PM    Final    DG CHEST PORT 1 VIEW Result Date: 08/31/2023 CLINICAL DATA:  Dyspnea EXAM: PORTABLE CHEST 1 VIEW COMPARISON:  08/25/2023 FINDINGS: Pulmonary insufflation is normal and symmetric. Mild left basilar atelectasis or infiltrate. No pneumothorax or pleural effusion. Status post aortic valve replacement. Stable cardiomegaly.  Implanted loop recorder again noted. Central pulmonary arteries are enlarged. No overt pulmonary edema. No acute bone abnormality. IMPRESSION: 1. Mild left basilar atelectasis or infiltrate. 2. Stable cardiomegaly. Electronically Signed   By: Worthy Heads M.D.   On: 08/31/2023 21:28   ECHOCARDIOGRAM COMPLETE BUBBLE STUDY Result Date: 08/27/2023    ECHOCARDIOGRAM REPORT   Patient Name:   Whitney [name] Date of Exam: 08/27/2023 Medical Rec #:  [id number]      Height:       63.7 in Accession #:    [id number]     Weight:       249.8 lb Date of Birth:  03/08/1956       BSA:          2.143 m Patient Age:    68 years       BP:           145/86 mmHg Patient Gender: F              HR:           71 bpm. Exam Location:  Inpatient Procedure: 2D Echo, Cardiac Doppler, Color Doppler and Saline Contrast Bubble            Study (Both Spectral and Color Flow Doppler were utilized during            procedure). Indications:    Stroke  History:        Patient has prior history of Echocardiogram examinations, most                 recent 03/14/2023. COPD, Signs/Symptoms:Altered Mental Status and                 Dyspnea; Risk Factors:Sleep Apnea, Diabetes and Dyslipidemia.  Sonographer:    Aldon Ambrosia Referring Phys: [id number] PROSPER M AMPONSAH IMPRESSIONS  1. Left ventricular ejection fraction, by estimation, is >75%. The left ventricle has hyperdynamic function. The left ventricle has no regional wall motion abnormalities. Left ventricular diastolic function could not be evaluated due to mitral annular calcification (moderate or greater) but hemodynamics suggestive of Grade III diastolic dysfunction. Elevated left atrial pressure. The E/e' is 17.  2. Right ventricular systolic function is normal. The right ventricular size is mildly enlarged. There is severely elevated pulmonary artery systolic pressure. The estimated right ventricular systolic pressure is 73.1 mmHg.  3. Left atrial size was severely dilated.  4. Severe mitral  subvalvular calcification.  5. The mitral valve is degenerative. Moderate mitral valve regurgitation. Mild mitral stenosis. The mean mitral valve gradient is 5.0 mmHg with average heart rate of 70 bpm. Severe mitral annular calcification.  6. Tricuspid valve regurgitation is severe.  7. S/P 21 mm Edwards Inspiris Resilia valve present in the aortic position. Procedure Date: 12/05/17. No valvular regurgitation. No valvular stenosis (peak velocity 2.27m/s, MG 13.52mmHG, DI 0.47, EOA 1.33, triangular jet contour, AT ). No PVL.  8. The inferior vena cava is dilated in size with <50% respiratory variability, suggesting right atrial pressure of 15 mmHg.  9. Agitated saline contrast bubble study was negative, with no evidence of any interatrial shunt. Conclusion(s)/Recommendation(s): No intracardiac source of embolism detected on this transthoracic study. Consider a transesophageal echocardiogram to exclude cardiac source of embolism if clinically indicated. FINDINGS  Left Ventricle: Left ventricular ejection fraction, by estimation, is >75%. The left ventricle has hyperdynamic function. The left ventricle has no regional wall motion abnormalities. The left ventricular internal cavity size was normal in size. There is borderline left ventricular hypertrophy. Left ventricular diastolic function could not be evaluated due to mitral annular calcification (moderate or greater) but hemodynamics suggestive of Grade III diastolic dysfunction. Elevated left atrial pressure. The E/e' is 10. Right Ventricle: The right ventricular size is mildly enlarged. Right vetricular wall thickness was not well visualized. Right ventricular systolic function is normal. There is severely elevated pulmonary artery systolic pressure. The tricuspid regurgitant velocity is 3.81 m/s, and with an assumed right atrial pressure of 15 mmHg, the estimated right ventricular systolic pressure is 73.1 mmHg. Left Atrium: Left atrial size was severely  dilated. Right Atrium: Right atrial size was normal in size. Pericardium: There is no evidence of pericardial effusion. Mitral Valve: The mitral valve is degenerative in appearance. Severe mitral annular calcification. Severe mitral subvalvular calcification. Moderate mitral valve regurgitation. Mild mitral valve stenosis. MV peak gradient, 15.2 mmHg. The mean mitral valve gradient is 5.0 mmHg with average heart rate of 70 bpm. Tricuspid Valve: The tricuspid valve is normal in structure. Tricuspid valve regurgitation is severe. No evidence of tricuspid stenosis. Aortic Valve: S/P 21 mm Edwards Inspiris Resilia valve present in the aortic position. Procedure Date: 12/05/17. No valvular regurgitation. No valvular stenosis (peak velocity 2.71m/s, MG 13.32mmHG, DI 0.47, EOA 1.33, triangular jet contour, AT ). No PVL. Aortic valve mean gradient measures 13.8 mmHg. Aortic valve peak gradient measures 24.6 mmHg. Aortic valve area, by VTI measures 1.33 cm. Pulmonic Valve: The pulmonic valve was grossly normal. Pulmonic valve regurgitation is mild. No evidence of pulmonic stenosis. Aorta: The aortic root and ascending aorta are structurally normal, with no evidence of dilitation. Venous: The inferior vena cava is dilated in size with less than 50% respiratory variability, suggesting right atrial pressure of 15 mmHg. IAS/Shunts: The atrial septum is grossly normal. Agitated saline contrast was given intravenously to evaluate for intracardiac shunting. Agitated saline contrast bubble study was negative, with no evidence of any interatrial shunt.  LEFT VENTRICLE PLAX 2D LVIDd:         4.20 cm      Diastology LVIDs:         2.80 cm      LV e' medial:    7.18 cm/s LV PW:         1.00 cm      LV E/e' medial:  20.8 LV IVS:        1.10 cm      LV e' lateral:   10.60 cm/s  LVOT diam:     1.90 cm      LV E/e' lateral: 14.1 LV SV:         73 LV SV Index:   34 LVOT Area:     2.84 cm  LV Volumes (MOD) LV vol d, MOD A2C: 163.0 ml LV  vol d, MOD A4C: 114.0 ml LV vol s, MOD A2C: 50.4 ml LV vol s, MOD A4C: 45.7 ml LV SV MOD A2C:     112.6 ml LV SV MOD A4C:     114.0 ml LV SV MOD BP:      88.2 ml RIGHT VENTRICLE             IVC RV Basal diam:  4.70 cm     IVC diam: 2.10 cm RV Mid diam:    3.70 cm RV S prime:     10.00 cm/s TAPSE (M-mode): 1.3 cm LEFT ATRIUM              Index        RIGHT ATRIUM           Index LA diam:        4.50 cm  2.10 cm/m   RA Area:     17.70 cm LA Vol (A2C):   133.0 ml 62.05 ml/m  RA Volume:   48.80 ml  22.77 ml/m LA Vol (A4C):   112.0 ml 52.25 ml/m LA Biplane Vol: 128.0 ml 59.72 ml/m  AORTIC VALVE                     PULMONIC VALVE AV Area (Vmax):    1.25 cm      PV Vmax:          1.12 m/s AV Area (Vmean):   1.37 cm      PV Peak grad:     5.1 mmHg AV Area (VTI):     1.33 cm      PR End Diast Vel: 10.50 msec AV Vmax:           248.00 cm/s AV Vmean:          170.000 cm/s AV VTI:            0.552 m AV Peak Grad:      24.6 mmHg AV Mean Grad:      13.8 mmHg LVOT Vmax:         109.00 cm/s LVOT Vmean:        81.900 cm/s LVOT VTI:          0.258 m LVOT/AV VTI ratio: 0.47  AORTA Ao Root diam: 3.60 cm Ao Asc diam:  3.60 cm MITRAL VALVE                TRICUSPID VALVE MV Area (PHT): 3.95 cm     TR Peak grad:   58.1 mmHg MV Area VTI:   1.52 cm     TR Vmax:        381.00 cm/s MV Peak grad:  15.2 mmHg MV Mean grad:  5.0 mmHg     SHUNTS MV Vmax:       1.95 m/s     Systemic VTI:  0.26 m MV Vmean:      104.0 cm/s   Systemic Diam: 1.90 cm MV Decel Time: 192 msec MR Peak grad: 145.4 mmHg MR Mean grad: 86.0 mmHg MR Vmax:      603.00 cm/s MR Vmean:     429.0 cm/s MV E  velocity: 149.00 cm/s MV A velocity: 64.90 cm/s MV E/A ratio:  2.30 Sunit Tolia Electronically signed by Olinda Bertrand Signature Date/Time: 08/27/2023/9:07:17 PM    Final    Overnight EEG with video Result Date: 08/27/2023 Arleene Lack, MD     08/28/2023 11:05 AM Patient Name: Whitney [name] MRN: [id number] Epilepsy Attending: Arleene Lack Referring  Physician/Provider: Eleni Griffin, MD Duration: 08/27/2023 0353 to 08/28/2023 0353 Patient history: 68 y.o. female with a history of Sjogren syndrome, diabetes, fibromyalgia, depression, GERD, aortic valve replacement, A-fib on Eliquis , TIA, NASH cirrhosis, pulmonary hypertension, thrombocytopenia and home O2 use and recent dog bite who presented with altered mental status.  EEG to evaluate for seizure Level of alertness: Awake, asleep AEDs during EEG study: None Technical aspects: This EEG study was done with scalp electrodes positioned according to the 10-20 International system of electrode placement. Electrical activity was reviewed with band pass filter of 1-70Hz , sensitivity of 7 uV/mm, display speed of 40mm/sec with a 60Hz  notched filter applied as appropriate. EEG data were recorded continuously and digitally stored.  Video monitoring was available and reviewed as appropriate. Description: No clear posterior dominant rhythm was seen. Sleep was characterized by vertex waves, maximal frontocentral region. EEG showed continuous generalized predominantly 5 to 7 Hz theta slowing admixed with intermittent 2-3hz  delta slowing. Hyperventilation and photic stimulation were not performed.    ABNORMALITY - Continuous slow, generalized  IMPRESSION: This study is suggestive of moderate diffuse encephalopathy. No seizures or epileptiform discharges were seen throughout the recording.  Arleene Lack   EEG adult Result Date: 08/26/2023 Arleene Lack, MD     08/26/2023 12:30 PM Patient Name: Whitney [name] MRN: [id number] Epilepsy Attending: Arleene Lack Referring Physician/Provider: Colon Dear, NP Date: 08/26/2023 Duration: 23.39 mins Patient history: 68yo F with ams. EEG to evaluate for seizure Level of alertness: Awake, asleep AEDs during EEG study: None Technical aspects: This EEG study was done with scalp electrodes positioned according to the 10-20 International system of electrode placement.  Electrical activity was reviewed with band pass filter of 1-70Hz , sensitivity of 7 uV/mm, display speed of 76mm/sec with a 60Hz  notched filter applied as appropriate. EEG data were recorded continuously and digitally stored.  Video monitoring was available and reviewed as appropriate. Description: The posterior dominant rhythm consists of 7 Hz activity of moderate voltage (25-35 uV) seen predominantly in posterior head regions, symmetric and reactive to eye opening and eye closing. Sleep was characterized by sleep spindles (12 to 14 Hz), maximal frontocentral region. EEG showed continuous generalized 6 to 7 Hz theta slowing. Hyperventilation and photic stimulation were not performed.   ABNORMALITY - Continuous slow, generalized IMPRESSION: This study is suggestive of mild to moderate diffuse encephalopathy. No seizures or epileptiform discharges were seen throughout the recording. Priyanka Suzanne Erps   CT ANGIO HEAD NECK W WO CM Result Date: 08/26/2023 CLINICAL DATA:  68 year old female with code stroke presentation. Small right superior frontal subcortical white matter infarct on MRI this morning. EXAM: CT ANGIOGRAPHY HEAD AND NECK WITH AND WITHOUT CONTRAST TECHNIQUE: Multidetector CT imaging of the head and neck was performed using the standard protocol during bolus administration of intravenous contrast. Multiplanar CT image reconstructions and MIPs were obtained to evaluate the vascular anatomy. Carotid stenosis measurements (when applicable) are obtained utilizing NASCET criteria, using the distal internal carotid diameter as the denominator. RADIATION DOSE REDUCTION: This exam was performed according to the departmental dose-optimization program which includes automated exposure control,  adjustment of the mA and/or kV according to patient size and/or use of iterative reconstruction technique. CONTRAST:  75mL OMNIPAQUE  IOHEXOL  350 MG/ML SOLN COMPARISON:  Brain MRI today reported separately. CTA head and neck  04/14/2022. FINDINGS: CTA NECK Skeleton: Sternotomy.  No acute osseous abnormality identified. Upper chest: CABG.  Upper lung atelectasis. Other neck: Retropharyngeal course of the carotids. Redemonstrated asymmetrically atrophied, heterogeneously calcified parotid glands and submandibular glands. Other nonvascular neck soft tissue spaces are within normal limits. Aortic arch: Calcified aortic atherosclerosis. 3 vessel arch configuration. Right carotid system: Tortuous brachiocephalic artery and right CCA with no significant plaque or stenosis. Mild calcified plaque at the right ICA origin. Tortuous right ICA without stenosis. Left carotid system: Similar tortuosity and mild calcified plaque without stenosis. Vertebral arteries: Tortuous proximal right subclavian origin with minimal plaque. Normal right vertebral artery origin. Mildly tortuous and non dominant right vertebral artery is patent to the skull base with no significant plaque or stenosis. Calcified proximal left subclavian artery plaque without stenosis. Normal left vertebral artery origin. Tortuous left V1 segment. Left vertebral artery is mildly dominant and tortuous to the skull base with no significant plaque or stenosis. CTA HEAD Posterior circulation: Distal vertebral arteries, vertebrobasilar junction, left PICA origin and dominant appearing right AICA are patent with no significant plaque or stenosis. Patent basilar artery without stenosis. Patent SCA origins. Fetal type bilateral PCA origins again noted. Bilateral PCA branches appear stable and within normal limits. Anterior circulation: Both ICA siphons are patent. Mild siphon tortuosity. Mild left siphon calcified plaque without stenosis. Similar right siphon tortuosity and mild calcified plaque without stenosis. Normal posterior communicating artery origins. Patent carotid termini. Normal MCA and ACA origins. Anterior communicating artery and bilateral ACA branches are within normal limits.  MCA M1 segments, bi/trifurcations are patent without stenosis. Bilateral MCA branches are stable and within normal limits. Venous sinuses: Patent. Anatomic variants: Fetal type PCA origins. Mildly dominant left vertebral artery. Review of the MIP images confirms the above findings IMPRESSION: 1. Negative large vessel occlusion. Generalized arterial tortuosity in the head and neck with mild for age atherosclerosis and no hemodynamically significant arterial stenosis. 2.  Aortic Atherosclerosis (ICD10-I70.0).  CABG. 3. Chronic salivary gland atrophy, sialolithiasis. Electronically Signed   By: Marlise Simpers M.D.   On: 08/26/2023 10:04   MR BRAIN WO CONTRAST Result Date: 08/26/2023 CLINICAL DATA:  Neuro deficit, acute, stroke suspected. Altered mental status. EXAM: MRI HEAD WITHOUT CONTRAST TECHNIQUE: Multiplanar, multiecho pulse sequences of the brain and surrounding structures were obtained without intravenous contrast. COMPARISON:  Head CT 08/25/2023 and MRI 03/17/2023 FINDINGS: The study is intermittently up to moderately motion degraded. Brain: There is a 1 cm acute infarct in the subcortical white matter of the posterior right frontal lobe. A few chronic cerebral microhemorrhages are nonspecific. Small T2 hyperintensities in the cerebral white matter nonspecific but compatible with mild chronic small vessel ischemic disease. There are small chronic infarcts involving the left caudate nucleus and cerebellum. A partially empty sella is unchanged. Cerebral volume is within normal limits for age. The ventricles are normal in size. No mass, midline shift, or extra-axial fluid collection is identified. Vascular: Major intracranial vascular flow voids are preserved. Skull and upper cervical spine: Unremarkable bone marrow signal. Sinuses/Orbits: Unremarkable orbits. Mild mucosal thickening in the paranasal sinuses. Trace right mastoid fluid. Other: None. IMPRESSION: 1. Small acute posterior right frontal white matter  infarct. 2. Mild chronic small vessel ischemic disease. Electronically Signed   By: Constantine Delude.D.  On: 08/26/2023 07:26   DG Chest Portable 1 View Result Date: 08/25/2023 CLINICAL DATA:  Altered mental status. EXAM: PORTABLE CHEST 1 VIEW COMPARISON:  Radiograph 08/24/2023 FINDINGS: Stable cardiomegaly. Loop recorder. AVR. Sternotomy. Aortic atherosclerotic calcification. Pulmonary vascular congestion. Prominent central pulmonary arteries. No focal consolidation, pleural effusion, or pneumothorax. IMPRESSION: Cardiomegaly with pulmonary vascular congestion. Electronically Signed   By: Rozell Cornet M.D.   On: 08/25/2023 19:36   CT HEAD CODE STROKE WO CONTRAST Result Date: 08/25/2023 CLINICAL DATA:  Code stroke.  Dysphasia EXAM: CT HEAD WITHOUT CONTRAST TECHNIQUE: Contiguous axial images were obtained from the base of the skull through the vertex without intravenous contrast. RADIATION DOSE REDUCTION: This exam was performed according to the departmental dose-optimization program which includes automated exposure control, adjustment of the mA and/or kV according to patient size and/or use of iterative reconstruction technique. COMPARISON:  CT head without contrast 08/24/2023. MRI of the head without contrast 03/17/2023 FINDINGS: Brain: No acute infarct, hemorrhage, or mass lesion is present. Mild periventricular white matter changes are stable. A remote lacunar infarct is again noted in the left caudate head. The deep brain nuclei are otherwise within normal limits. No acute or focal cortical abnormalities are present. The ventricles are of normal size. No significant extraaxial fluid collection is present. An enlarged relatively empty sella is present. Midline structures are otherwise unremarkable. Vascular: Atherosclerotic calcifications are present within the cavernous internal carotid arteries bilaterally. No hyperdense vessel is present. Skull: Calvarium is intact. No focal lytic or blastic lesions  are present. No significant extracranial soft tissue lesion is present. Sinuses/Orbits: The paranasal sinuses and mastoid air cells are clear. The globes and orbits are within normal limits. Other: Diffuse fatty infiltration of the parotid glands is again noted bilaterally. Multiple punctate calcifications are present in both parotid glands. ASPECTS [facility] Stroke Program Early CT Score) - Ganglionic level infarction (caudate, lentiform nuclei, internal capsule, insula, M1-M3 cortex): 7/7 - Supraganglionic infarction (M4-M6 cortex): 3/3 Total score (0-10 with 10 being normal): 10/10 IMPRESSION: 1. No acute intracranial abnormality or significant interval change. 2. Stable mild periventricular white matter disease. This likely reflects the sequela of chronic microvascular ischemia. 3. Remote lacunar infarct of the left caudate head. 4. Aspects is 10/10. 5. Diffuse fatty infiltration of the parotid glands bilaterally with multiple punctate calcifications. This suggests Sjogren disease The above was relayed via text pager to Dr. Cleone Dad on 08/25/2023 at 17:48 . Electronically Signed   By: Audree Leas M.D.   On: 08/25/2023 17:48   DG Chest 2 View Result Date: 08/24/2023 CLINICAL DATA:  Chest pain. EXAM: CHEST - 2 VIEW COMPARISON:  10/25/2022. FINDINGS: Heart is enlarged and the mediastinal contour is within normal limits. Left atrial appendage clip and prosthetic valve are noted. Lung volumes are low resulting in vascular crowding. No consolidation, effusion, or pneumothorax. Sternotomy wires are present over the midline. Degenerative changes are seen in the thoracic spine. IMPRESSION: No active cardiopulmonary disease. Electronically Signed   By: Wyvonnia Heimlich M.D.   On: 08/24/2023 16:03   CT Head Wo Contrast Result Date: 08/24/2023 CLINICAL DATA:  Mental status change, unknown cause. EXAM: CT HEAD WITHOUT CONTRAST TECHNIQUE: Contiguous axial images were obtained from the base of the skull through the  vertex without intravenous contrast. RADIATION DOSE REDUCTION: This exam was performed according to the departmental dose-optimization program which includes automated exposure control, adjustment of the mA and/or kV according to patient size and/or use of iterative reconstruction technique. COMPARISON:  03/17/2023. FINDINGS: Brain: No  acute intracranial hemorrhage, midline shift or mass effect. No extra-axial fluid collection. Mild periventricular white matter hypodensities are present bilaterally. No hydrocephalus. Vascular: No hyperdense vessel or unexpected calcification. Skull: Normal. Negative for fracture or focal lesion. Sinuses/Orbits: There is partial opacification of the mastoid air cells bilaterally. Mucosal thickening is present in the maxillary sinuses. No acute orbital abnormality. Other: None. IMPRESSION: 1. No acute intracranial process. 2. Mild chronic microvascular ischemic changes. Electronically Signed   By: Wyvonnia Heimlich M.D.   On: 08/24/2023 15:59   DG Forearm Left Result Date: 08/20/2023 CLINICAL DATA:  Dog bite left forearm EXAM: LEFT FOREARM - 2 VIEW COMPARISON:  None Available. FINDINGS: Frontal and lateral views of the left forearm are obtained on 3 images. There is marked soft tissue edema throughout the dorsum of the forearm, greatest distally. No fracture or radiopaque foreign body. Alignment is anatomic. IMPRESSION: 1. Diffuse soft tissue swelling. No fracture or radiopaque foreign body. Electronically Signed   By: Bobbye Burrow M.D.   On: 08/20/2023 08:38

## 2023-09-14 NOTE — Progress Notes (Signed)
 Progress Note    Whitney [name]   UJW:[id number]  DOB: 05/19/1955  DOA: 08/25/2023     20 PCP: Jimmey Mould, MD  Initial CC: Altered mentation  Hospital Course: Ms. Pagett is a 68 year old female with PMH NASH cirrhosis,HTN, HLD, pulmonary hypertension, Sjogren's syndrome, thrombocytopenia in the setting of cirrhosis, type 2 diabetes and anemia who presented to the ED at [facility] 4/20 for complaints of altered mental status.  Altered mentation was ultimately considered to be multifactorial in setting of respiratory failure along with hepatic encephalopathy.  Significant Hospital Events:  4/20 Admitted with altered mental status being this blood gas with PCO2 69.4 4/26 PCCM consulted ABG with pH 7.29 and PCO261, moved to the intensive care.  Placed on NIPPV had not received diuretics since admission 4/27 Did have a hypoglycemic event requiring dextrose  replacement BNP was 1322 echo cardiogram review shows hyperdynamic LV with grade 3 diastolic dysfunction and elevated LAP bioprosthesis aortic valve in place there was severe TR, severe mitral valve disease and severe PAH with a dilated IVC.,  Limited follow-up echocardiogram continues to show hyperdynamic EF greater than 75% RV moderately enlarged with moderately elevated pulmonary artery systolic pressures, left atrial size dilated estimated right atrial pressure 15 mmHg, 4/28 PICC placed, norepi and later vasopressin  started. Ad HF added empiric ceftriaxone  4/29 still w/ some hypercarbia, stopped dex. Cont lasix  gtt and vasopressor support. Volume status much improved. CXR improved. Delirium an issue. PT ordered. OOB ordered. Resumed Neurontin . 5/1 Remains on NE, Vaso. weaning pressors. Started low dose spironolactone , considering TEE/DCCV prior to dc, seen by rehab team who felt BIPAP on intermittent basis would be barrier but if only needed at HS could be considered candidate  5/2 NE stopped. Changed goal to MAP 65 but advanced HF noting  "Her PAH from portopulmonary hypertension may be contributing mildly, but the main driver of her pathology is her hepatic failure" noted that should she fail pressor wean palliative consult should be considered. Lasix  and spiro stopped 5/3 Stayed off norepi. MAPs >65 Still on vasopressin . Scr up a little. CVP 14-15.  Ammonia level was 80, started lactulose .  Got a dose of Lasix . 5/4 CXR improved s/p diuretics, a little more awake.  Vasopressin  off 5/6 Levophed  off. Afib RVR later in the day with rates up to 130s. 5/8 HR better controlled stable for transfer out of the ICU  Interval History:  Remains confused and lethargic. Difficulty with improvement and now GOC being addressed with family and palliative care this morning. May be transitioning to comfort care.   Assessment and Plan: * Acute hepatic encephalopathy (HCC) - NH3 peaked at 133 and has downtrended with lactulose  use; mentation has reportedly also slowly improved according to her son -Continue lactulose  and rifaximin   Goals of care, counseling/discussion - Greatly appreciate palliative care assistance -Given complex hospitalization with minimal improvement despite aggressive measures medically, recommendations have been for discussing further goals of care; at this time, it sounds like family is deciding to transition towards comfort care and residential hospice placement - Palliative care meeting with family this morning, follow-up discussions  Acute on chronic heart failure with preserved ejection fraction (HFpEF) (HCC) - Updated echo this admission: EF greater than 75%, hyperdynamic function, moderately enlarged RV.  Severely dilated LA.  Severe mitral annular calcification.  No AS.  Bioprosthetic aortic valve -Advanced heart failure team following, currently on Lasix  drip along with metolazone  and Diamox  as necessary -CVP is being monitored and have been downtrending - remains on  midodrine ; GDMT limited by  hypoTN/cirrhosis  Pulmonary hypertension (HCC) - Tadalafil  on hold due to pressure limitations - Cardiology has discussed RHC with patient and family but due to underlying comorbidities, medical therapy being pursued  Hypernatremia - Improved with tube feeds and free water  flushes - Continue monitoring  AKI (acute kidney injury) (HCC) - Has been considered a component of hepatorenal +/- CRS - normal renal fxn in April; slowly uptrended and around 1.8 - 2 currently - will have to see if recovers and where stabilizes ultimately - no indication for HD at this time  Acute on chronic respiratory failure with hypoxia and hypercapnia (HCC) - s/p bipap use and diuresis - now stable on 2L - on 3L chronic O2 at home  Severe thrombocytopenia (HCC) - Attributed to underlying cirrhosis.  Evaluated by hematology on 09/12/2023 as well - If platelet >50K, continue apixaban  5 mg twice daily. If platelet 30-50K, apixaban  2.5 mg twice daily. If platelet <30K, hold apixaban . Avoid antiplatelet and NSAIDs - Transfuse platelets for counts <10K or <50 K with active bleeding  - Baseline platelets appear to be in the 40s -70 range.   Liver cirrhosis secondary to NASH [facility]) - Diagnosed in 2014 and followed at [facility] -Last EGD 2021 with grade 2 esophageal varices - Home nadolol  on hold - Continue lactulose  and rifaximin   Paroxysmal atrial fibrillation (HCC) - Developed RVR during hospitalization - Amiodarone  being avoided per cardiology in setting of liver disease - Continue monitoring electrolytes - Continue Eliquis  - Cardiology managing.  Currently on Toprol  for rate control  Inadequate oral intake - Appreciate RD following - Continue cortrak with tube feeds and supplements as recommended - Continue encouraging increasing oral intake.  Patient eating adequately prior to hospitalization per son  Hypokalemia - Replete as needed  At high risk for bleeding - noted in setting of thrombocytopenia and  cirrhosis along with anticoagulation with Eliquis   Pressure injury of skin - continue preventative measures per protocol   Moderate persistent asthma - no wheezing noted - nebs PRN  Dog bite - Per previous note, bit on left forearm on 08/20/2023 by her brothers dog (immunized) and discharged home from ER on Augmentin  - Wound noted to be healing per prior notes and no further need for antibiotics  Generalized weakness - Prolonged hospitalization placing at risk for further physical deconditioning.  Undergoing CIR evaluation - Continue working with PT/OT  DM2 (diabetes mellitus, type 2) (HCC) - A1c 5.1% on 08/26/2023 - Appears to overall be diet controlled prior to hospitalization - Some recent elevation in glucose levels likely due to tube feeds - if sustains can consider starting some sliding scale  History of CVA (cerebrovascular accident) - MRI brain 08/26/23: "Small acute posterior right frontal white matter infarct" - Continue Eliquis , Crestor , Zetia  for secondary prevention - Previously ambulated independently at home; lives with brother - Further outpatient follow-up with neurology for cognitive testing  HLD (hyperlipidemia) - Continue Crestor  and Zetia   Sjogren's syndrome (HCC) - On chronic Plaquenil   OSA (obstructive sleep apnea) - Was noted to be noncompliant on CPAP prior to hospitalization for approximately 3 or more weeks due to broken/missing part.  Was awaiting on home health agency for repair    Old records reviewed in assessment of this patient  Antimicrobials:   DVT prophylaxis:     Code Status:   Code Status: Do not attempt resuscitation (DNR) - Comfort care  Mobility Assessment (Last 72 Hours)     Mobility Assessment  Row Name 09/13/23 2008 09/13/23 1500 09/13/23 0836 09/13/23 0228 09/11/23 2000   Does patient have an order for bedrest or is patient medically unstable No - Continue assessment -- No - Continue assessment No - Continue  assessment No - Continue assessment   What is the highest level of mobility based on the progressive mobility assessment? Level 2 (Chairfast) - Balance while sitting on edge of bed and cannot stand Level 2 (Chairfast) - Balance while sitting on edge of bed and cannot stand Level 2 (Chairfast) - Balance while sitting on edge of bed and cannot stand Level 2 (Chairfast) - Balance while sitting on edge of bed and cannot stand Level 2 (Chairfast) - Balance while sitting on edge of bed and cannot stand   Is the above level different from baseline mobility prior to current illness? Yes - Recommend PT order -- Yes - Recommend PT order -- --            Barriers to discharge: None Disposition Plan: To be determined HH orders placed: N/A Status is: Inpatient  Objective: Blood pressure 110/86, pulse (!) 129, temperature 97.9 F (36.6 C), temperature source Oral, resp. rate 20, height 5' 3.75" (1.619 m), weight 100.9 kg, SpO2 93%.  Examination:  Physical Exam Constitutional:      General: She is not in acute distress.    Appearance: Normal appearance.  HENT:     Head: Normocephalic and atraumatic.     Mouth/Throat:     Mouth: Mucous membranes are moist.  Eyes:     Extraocular Movements: Extraocular movements intact.  Cardiovascular:     Rate and Rhythm: Tachycardia present. Rhythm irregular.  Pulmonary:     Effort: Pulmonary effort is normal. No respiratory distress.     Breath sounds: Normal breath sounds. No wheezing.  Abdominal:     General: Bowel sounds are normal. There is no distension.     Palpations: Abdomen is soft.     Tenderness: There is no abdominal tenderness.  Musculoskeletal:        General: Normal range of motion.     Cervical back: Normal range of motion and neck supple.  Skin:    General: Skin is warm and dry.  Neurological:     Mental Status: She is alert. She is disoriented.     Comments: Sleeping and remains confused  Psychiatric:        Mood and Affect: Mood  normal.      Consultants:  Advanced heart failure team  Procedures:    Data Reviewed: Results for orders placed or performed during the hospital encounter of 08/25/23 (from the past 24 hours)  Glucose, capillary     Status: Abnormal   Collection Time: 09/13/23  4:40 PM  Result Value Ref Range   Glucose-Capillary 121 (H) 70 - 99 mg/dL  Glucose, capillary     Status: Abnormal   Collection Time: 09/13/23  9:23 PM  Result Value Ref Range   Glucose-Capillary 193 (H) 70 - 99 mg/dL   Comment 1 Notify RN    Comment 2 Document in Chart   Magnesium      Status: None   Collection Time: 09/14/23  5:00 AM  Result Value Ref Range   Magnesium  2.2 1.7 - 2.4 mg/dL  CBC with Differential/Platelet     Status: Abnormal   Collection Time: 09/14/23  5:00 AM  Result Value Ref Range   WBC 6.8 4.0 - 10.5 K/uL   RBC 4.22 3.87 - 5.11 MIL/uL  Hemoglobin 12.7 12.0 - 15.0 g/dL   HCT 13.0 86.5 - 78.4 %   MCV 100.0 80.0 - 100.0 fL   MCH 30.1 26.0 - 34.0 pg   MCHC 30.1 30.0 - 36.0 g/dL   RDW 69.6 (H) 29.5 - 28.4 %   Platelets 32 (L) 150 - 400 K/uL   nRBC 0.0 0.0 - 0.2 %   Neutrophils Relative % 63 %   Neutro Abs 4.2 1.7 - 7.7 K/uL   Lymphocytes Relative 18 %   Lymphs Abs 1.2 0.7 - 4.0 K/uL   Monocytes Relative 15 %   Monocytes Absolute 1.0 0.1 - 1.0 K/uL   Eosinophils Relative 4 %   Eosinophils Absolute 0.3 0.0 - 0.5 K/uL   Basophils Relative 0 %   Basophils Absolute 0.0 0.0 - 0.1 K/uL   Immature Granulocytes 0 %   Abs Immature Granulocytes 0.02 0.00 - 0.07 K/uL  Comprehensive metabolic panel with GFR     Status: Abnormal   Collection Time: 09/14/23  5:00 AM  Result Value Ref Range   Sodium 139 135 - 145 mmol/L   Potassium 3.3 (L) 3.5 - 5.1 mmol/L   Chloride 91 (L) 98 - 111 mmol/L   CO2 38 (H) 22 - 32 mmol/L   Glucose, Bld 235 (H) 70 - 99 mg/dL   BUN 60 (H) 8 - 23 mg/dL   Creatinine, Ser 1.32 (H) 0.44 - 1.00 mg/dL   Calcium  8.6 (L) 8.9 - 10.3 mg/dL   Total Protein 5.6 (L) 6.5 - 8.1 g/dL    Albumin  2.5 (L) 3.5 - 5.0 g/dL   AST 49 (H) 15 - 41 U/L   ALT 13 0 - 44 U/L   Alkaline Phosphatase 79 38 - 126 U/L   Total Bilirubin 1.9 (H) 0.0 - 1.2 mg/dL   GFR, Estimated 24 (L) >60 mL/min   Anion gap 10 5 - 15  Glucose, capillary     Status: Abnormal   Collection Time: 09/14/23  6:14 AM  Result Value Ref Range   Glucose-Capillary 148 (H) 70 - 99 mg/dL   Comment 1 Notify RN    Comment 2 Document in Chart   Ammonia     Status: Abnormal   Collection Time: 09/14/23 11:30 AM  Result Value Ref Range   Ammonia 75 (H) 9 - 35 umol/L  Glucose, capillary     Status: Abnormal   Collection Time: 09/14/23 11:39 AM  Result Value Ref Range   Glucose-Capillary 166 (H) 70 - 99 mg/dL    I have reviewed pertinent nursing notes, vitals, labs, and images as necessary. I have ordered labwork to follow up on as indicated.  I have reviewed the last notes from staff over past 24 hours. I have discussed patient's care plan and test results with nursing staff, CM/SW, and other staff as appropriate.  Time spent: Greater than 50% of the 55 minute visit was spent in counseling/coordination of care for the patient as laid out in the A&P.   LOS: 20 days   Faith Homes, MD [facility] 09/14/2023, 2:26 PM

## 2023-09-14 NOTE — Assessment & Plan Note (Signed)
 Cause of chronic thrombocytopenia.

## 2023-09-14 NOTE — Assessment & Plan Note (Addendum)
 If platelet >50K, continue apixaban  5 mg twice daily If platelet 30-50K, apixaban  2.5 mg twice daily. If platelet <30K, hold apixaban . Avoid antiplatelet and NSAIDs.

## 2023-09-14 NOTE — Progress Notes (Signed)
 Report given to Robin.

## 2023-09-14 NOTE — Progress Notes (Signed)
 Patient ID: Whitney [name], female   DOB: Aug 23, 1955, 68 y.o.   MRN: [id number]     Advanced Heart Failure Rounding Note  Cardiologist: Janelle Mediate, MD  AHF/PH Clinic: Dr. Mitzie Anda  Chief Complaint: HFpEF, Pulmonary Hypertension Subjective:    She has been on Lasix  gtt 30 gm/hr and had  IV diamox  500 mg once yesterday.  I/Os not recorded, weight down 1 lb.  CVP 13 this morning.    NH3 up to 185 yesterday and more confused.  This morning, awake but confused. +Nausea and vomiting today.   SCr 2.08>>1.92>>2.15 K 3.3  BP remains stable on midodrine , 90s-100s systolic. Currently appears to be in atrial flutter with HR around 120. She is on apixaban  with dose decreased due to platlets.   Objective:    Weight Range: 100.9 kg Body mass index is 38.48 kg/m.   Vital Signs:   Temp:  [97.9 F (36.6 C)-98.7 F (37.1 C)] 97.9 F (36.6 C) (05/10 0700) Pulse Rate:  [103-129] 129 (05/10 0700) Resp:  [16-20] 17 (05/10 0700) BP: (91-100)/(69-79) 100/69 (05/10 0700) SpO2:  [90 %-97 %] 94 % (05/10 0700) Weight:  [100.9 kg] 100.9 kg (05/10 0523) Last BM Date : 09/12/23  Weight change: Filed Weights   09/12/23 0445 09/13/23 0453 09/14/23 0523  Weight: 99.3 kg 101.2 kg 100.9 kg   Intake/Output:  Intake/Output Summary (Last 24 hours) at 09/14/2023 1104 Last data filed at 09/14/2023 1034 Gross per 24 hour  Intake --  Output 1050 ml  Net -1050 ml    Physical Exam   CVP 13 General: NAD Neck: JVP 12-14 cm, no thyromegaly or thyroid  nodule.  Lungs: Clear to auscultation bilaterally with normal respiratory effort. CV: Nondisplaced PMI.  Heart tachy, regular S1/S2, no S3/S4, no murmur.  1+ edema to knees.  Abdomen: Soft, nontender, no hepatosplenomegaly, no distention.  Skin: Intact without lesions or rashes.  Neurologic: Awake but does not answer questions Extremities: No clubbing or cyanosis.  HEENT: Normal.    Telemetry   Atrial flutter rate around 120  (personally reviewed)  Labs     CBC Recent Labs    09/12/23 0430 09/14/23 0500  WBC 7.3 6.8  NEUTROABS 4.5 4.2  HGB 13.5 12.7  HCT 43.6 42.2  MCV 97.3 100.0  PLT 38* 32*   Basic Metabolic Panel Recent Labs    57/84/69 0430 09/12/23 2140 09/13/23 0909 09/14/23 0500  NA 143   < > 141 139  K 2.8*   < > 3.3* 3.3*  CL 100   < > 93* 91*  CO2 36*   < > 39* 38*  GLUCOSE 195*   < > 188* 235*  BUN 50*   < > 54* 60*  CREATININE 1.77*   < > 2.03* 2.15*  CALCIUM  9.3   < > 8.9 8.6*  MG 2.0   < > 2.2 2.2  PHOS 3.1  --   --   --    < > = values in this interval not displayed.   BNP (last 3 results) Recent Labs    09/03/23 0320 09/04/23 0322 09/05/23 0419  BNP 2,106.9* 6,295.2* 1,540.4*   ProBNP (last 3 results) Recent Labs    03/13/23 1428  PROBNP 996.0*   Medications:    Scheduled Medications:  apixaban   2.5 mg Per Tube BID   Chlorhexidine  Gluconate Cloth  6 each Topical Q0600   ezetimibe   10 mg Per Tube QHS   feeding supplement  237 mL Per Tube BID BM  feeding supplement (PROSource TF20)  60 mL Per Tube Daily   free water   200 mL Per Tube Q6H   gabapentin   100 mg Per Tube Q8H   Gerhardt's butt cream   Topical TID   hydroxychloroquine   200 mg Per Tube Daily   lactulose   30 g Per Tube QID   lidocaine   1 patch Transdermal Q24H   metoprolol  tartrate  2.5 mg Intravenous Q4H   midodrine   15 mg Per Tube TID WC   multivitamin with minerals  1 tablet Per Tube BID   pantoprazole   40 mg Oral Daily   polyethylene glycol  17 g Per Tube Daily   revefenacin   175 mcg Nebulization Daily   rifaximin   550 mg Per Tube BID   rosuvastatin   10 mg Per Tube Daily   senna  2 tablet Per Tube Daily   sodium chloride  flush  10-40 mL Intracatheter Q12H   spironolactone   25 mg Per Tube Daily   Infusions:  feeding supplement (OSMOLITE 1.5 CAL) 1,000 mL (09/13/23 2133)   furosemide  (LASIX ) 200 mg in dextrose  5 % 100 mL (2 mg/mL) infusion 30 mg/hr (09/14/23 0432)     PRN Medications: acetaminophen  **OR**  acetaminophen , albuterol , alum & mag hydroxide-simeth, antiseptic oral rinse, bisacodyl , lip balm, melatonin, naLOXone  (NARCAN )  injection, ondansetron  (ZOFRAN ) IV, mouth rinse, oxyCODONE , sodium chloride  flush  Patient Profile   Whitney [name] is a 68-year-old female with a complex past medical history of Sjogren syndrome, cirrhosis, rheumatic heart disease with bioprosthetic AVR, mitral valve disease, pulmonary hypertension.    Assessment/Plan   HFpEF/ high output HF Pulmonary hypertension - RHC in 2024 most consistent with precapillary PH, started on tadalafil . Done after diuresis, and suspect this underestimates her group II component, especially given mitral valve disease - Does have a large component of portopulmonary HTN, especially given high cardiac output - Echo this admission: EF >75%, RV moderately enlarged, RVSP 51 mmHg, severe LAE - Hypotension on admission, suspect liver disease is main driver. Initially required NE and VP. Now off IV pressor support. SBPs stable in the 90s w/ midodrine  support. - now volume overloaded, slow response to diuretics. CVP 13 today, weight down 1 lb.  Creatinine trending up slowly, 2.15 today.  - continue lasix  gtt at 30 hr, will give acetazolamide  500 mg IV again today.  - continue midodrine  15 mg tid  - GDMT limited by renal dysfunction and hypotension  - Poor SGLT2i candidate - Hold Tadalifil, suspect will not be able to resume given need high dose midodrine . - We discussed RHC w/ patient and family. Due to intermittent confusion and the severity of her heart disease, liver disease, pulmonary hypertension and CKD we opted for medical therapy only at this time   Acute on chronic hypoxic and hypercapnic respiratory failure - In setting of COPD, OSA and volume overload - Now on [place], 3L. Intermittent confusion   AKI - Suspect mostly hepatorenal +/- component of cardiorenal - Prev improved with diuresis, now trending up. Likely in the setting of  volume re-accumulation, 2.0 today. Stable past 24 hrs  - continue midodrine  for BP support  - diuresis as above  Valvular disease  - S/p bioprosthetic AVR, severe MAC - Gradients mildly high for AVR, but otherwise functioning well   Cirrhosis - due to NAFLD, w/ portal HTN - Follow[facility] at [facility] - Nadolol  on hold d/t hypotension  - NH3 higher to 185 yesterday and is now more confused.  - Increase lactulose  to 30 qid.  -  Continue rifaximin   PAF:  - Continue eliquis . Per hematology given current plt levels, use 2.5 mg bid  (stop if plts drop below 30K).  - HR 120 in atrial flutter.  - limited rate/rhythm control options  - avoiding amio w/ liver dz - Transition metoprolol  to 2.5 mg IV every 4hrs with vomiting.   Thrombocytopenia - In setting of cirrhosis - Plts have drifted from 80s>>46>>38K>>32K, historically 50s-60s. No gross bleeding  - per hematology  If platelet >50K, continue apixaban  5 mg twice daily If platelet 30-50K, apixaban  2.5 mg twice daily. If platelet <30K, hold apixaban   Hypernatremia - improving Na 149>>147>>141>>139 today  -  Hypokalemia - K 3.3, in setting of diuresis + high stool output from lactulose  + rifaximin  - supp K today and repeat BMET.   With ongoing CHF, worsening renal failure, and worsening hepatic encephalopathy, it would be reasonable to consider hospice/comfort care.  Discussed this with family today and palliative care to see.   Peder Bourdon 09/14/2023 11:04 AM

## 2023-09-14 NOTE — Plan of Care (Signed)

## 2023-09-14 NOTE — Progress Notes (Signed)
 [facility] Liaison Note  Referral received from Palliative NP for family interest in [facility].   Met with patient and family in room to explain services and hospice philosophy and all questions answered.   [facility] is able to accept patient this afternoon once consents are complete.   RN staff, you may call report at any time to [facility] @ [phone], room is assigned when report is called.   Please leave IV intact and send completed DNR with patient.   Updated attending and [facility] manager via [employer].  Thank you for the opportunity to participate in this patient's care   Jacqlyn Matas, BSN, RN Hospice Nurse Liaison [phone]

## 2023-09-14 NOTE — Assessment & Plan Note (Signed)
-  As above.

## 2023-09-14 NOTE — Assessment & Plan Note (Addendum)
-   Greatly appreciate palliative care assistance -Given complex hospitalization with minimal improvement despite aggressive measures medically, recommendations have been for discussing further goals of care; at this time, it sounds like family is deciding to transition towards comfort care and residential hospice placement - Palliative care meeting with family this morning; decision made for transitioning to comfort care and residential hospice

## 2023-09-14 NOTE — Discharge Summary (Signed)
 Physician Discharge Summary   Whitney [name] ZOX:[id number] DOB: February 06, 1956 DOA: 08/25/2023  PCP: Jimmey Mould, MD  Admit date: 08/25/2023 Discharge date: 09/14/2023  Admitted From: Home Disposition:  [facility] Discharging physician: Faith Homes, MD  Recommendations at discharge: Continue comfort    Discharge Condition: poor CODE STATUS: DNR Diet recommendation:  Diet Orders (From admission, onward)     Start     Ordered   09/14/23 1202  Diet regular Room service appropriate? Yes; Fluid consistency: Thin  Diet effective now       Comments: May eat/drink as desired for end of life- careful hand feed  Question Answer Comment  Room service appropriate? Yes   Fluid consistency: Thin      09/14/23 1210            Hospital Course: Ms. Hotham is a 68 year old female with PMH NASH cirrhosis,HTN, HLD, pulmonary hypertension, Sjogren's syndrome, thrombocytopenia in the setting of cirrhosis, type 2 diabetes and anemia who presented to the ED at [facility] 4/20 for complaints of altered mental status.  Altered mentation was ultimately considered to be multifactorial in setting of respiratory failure along with hepatic encephalopathy.  Significant Hospital Events:  4/20 Admitted with altered mental status being this blood gas with PCO2 69.4 4/26 PCCM consulted ABG with pH 7.29 and PCO261, moved to the intensive care.  Placed on NIPPV had not received diuretics since admission 4/27 Did have a hypoglycemic event requiring dextrose  replacement BNP was 1322 echo cardiogram review shows hyperdynamic LV with grade 3 diastolic dysfunction and elevated LAP bioprosthesis aortic valve in place there was severe TR, severe mitral valve disease and severe PAH with a dilated IVC.,  Limited follow-up echocardiogram continues to show hyperdynamic EF greater than 75% RV moderately enlarged with moderately elevated pulmonary artery systolic pressures, left atrial size dilated estimated right atrial  pressure 15 mmHg, 4/28 PICC placed, norepi and later vasopressin  started. Ad HF added empiric ceftriaxone  4/29 still w/ some hypercarbia, stopped dex. Cont lasix  gtt and vasopressor support. Volume status much improved. CXR improved. Delirium an issue. PT ordered. OOB ordered. Resumed Neurontin . 5/1 Remains on NE, Vaso. weaning pressors. Started low dose spironolactone , considering TEE/DCCV prior to dc, seen by rehab team who felt BIPAP on intermittent basis would be barrier but if only needed at HS could be considered candidate  5/2 NE stopped. Changed goal to MAP 65 but advanced HF noting "Her PAH from portopulmonary hypertension may be contributing mildly, but the main driver of her pathology is her hepatic failure" noted that should she fail pressor wean palliative consult should be considered. Lasix  and spiro stopped 5/3 Stayed off norepi. MAPs >65 Still on vasopressin . Scr up a little. CVP 14-15.  Ammonia level was 80, started lactulose .  Got a dose of Lasix . 5/4 CXR improved s/p diuretics, a little more awake.  Vasopressin  off 5/6 Levophed  off. Afib RVR later in the day with rates up to 130s. 5/8 HR better controlled stable for transfer out of the ICU  Assessment and Plan: * Acute hepatic encephalopathy (HCC) - NH3 peaked at 133 and has downtrended with lactulose  use; mentation has reportedly also slowly improved according to her son -has been on lactulose  and rifaximin  - despite treatment; labile NH3 and minimal improvement - Recommendation has now been made for transitioning to comfort care  Goals of care, counseling/discussion - Greatly appreciate palliative care assistance -Given complex hospitalization with minimal improvement despite aggressive measures medically, recommendations have been for discussing further goals of  care; at this time, it sounds like family is deciding to transition towards comfort care and residential hospice placement - Palliative care meeting with family this  morning; decision made for transitioning to comfort care and residential hospice  Acute on chronic heart failure with preserved ejection fraction (HFpEF) (HCC) - Updated echo this admission: EF greater than 75%, hyperdynamic function, moderately enlarged RV.  Severely dilated LA.  Severe mitral annular calcification.  No AS.  Bioprosthetic aortic valve -Advanced heart failure team following, currently on Lasix  drip along with metolazone  and Diamox  as necessary -CVP is being monitored and have been downtrending - remains on midodrine ; GDMT limited by hypoTN/cirrhosis - Now transitioning to comfort care after family discussion with cardiology  Pulmonary hypertension (HCC) - Tadalafil  on hold due to pressure limitations - Cardiology has discussed RHC with patient and family but due to underlying comorbidities, medical therapy being pursued  Hypernatremia - Improved with tube feeds and free water  flushes  AKI (acute kidney injury) (HCC) - Has been considered a component of hepatorenal +/- CRS - normal renal fxn in April; slowly uptrended and around 1.8 - 2 currently - will have to see if recovers and where stabilizes ultimately - no indication for HD at this time  Acute on chronic respiratory failure with hypoxia and hypercapnia (HCC) - s/p bipap use and diuresis - now stable on 2L - on 3L chronic O2 at home - May consider oxygen  for comfort  Severe thrombocytopenia (HCC) - Attributed to underlying cirrhosis.  Evaluated by hematology on 09/12/2023 as well - If platelet >50K, continue apixaban  5 mg twice daily. If platelet 30-50K, apixaban  2.5 mg twice daily. If platelet <30K, hold apixaban . Avoid antiplatelet and NSAIDs - Transfuse platelets for counts <10K or <50 K with active bleeding  - Baseline platelets appear to be in the 40s -70 range.  - Comfort care now  Liver cirrhosis secondary to NASH [facility]) - Diagnosed in 2014 and followed at [facility] -Last EGD 2021 with grade 2 esophageal  varices - Home nadolol  on hold - Has been on lactulose  and rifaximin   Paroxysmal atrial fibrillation (HCC) - Developed RVR during hospitalization - Amiodarone  being avoided per cardiology in setting of liver disease - Discontinuing Eliquis  and Toprol  in setting of pursuing comfort care  Inadequate oral intake - Appreciate RD following - s/p cortrak with TF - no transitioning to comfort  Hypokalemia - Repleted  At high risk for bleeding - noted in setting of thrombocytopenia and cirrhosis along with anticoagulation with Eliquis   Pressure injury of skin - continue preventative measures per protocol   Moderate persistent asthma - no wheezing noted  Dog bite - Per previous note, bit on left forearm on 08/20/2023 by her brothers dog (immunized) and discharged home from ER on Augmentin  - Wound noted to be healing per prior notes and no further need for antibiotics  Generalized weakness - Prolonged hospitalization placing at risk for further physical deconditioning  DM2 (diabetes mellitus, type 2) (HCC) - A1c 5.1% on 08/26/2023  History of CVA (cerebrovascular accident) - MRI brain 08/26/23: "Small acute posterior right frontal white matter infarct" - Continue Eliquis , Crestor , Zetia  for secondary prevention - Previously ambulated independently at home; lives with brother  HLD (hyperlipidemia) - On Crestor  and Zetia  at home - Now transitioning to comfort  Sjogren's syndrome (HCC) - On chronic Plaquenil   OSA (obstructive sleep apnea) - Was noted to be noncompliant on CPAP prior to hospitalization for approximately 3 or more weeks due to broken/missing part.  Was awaiting on home health agency for repair    Principal Diagnosis: Acute hepatic encephalopathy [facility])  Discharge Diagnoses: Active Hospital Problems   Diagnosis Date Noted   Acute hepatic encephalopathy (HCC) 08/25/2023    Priority: 1.   Goals of care, counseling/discussion 09/14/2023    Priority: 1.   Acute  on chronic heart failure with preserved ejection fraction (HFpEF) (HCC) 09/09/2023    Priority: 1.   Pulmonary hypertension (HCC) 06/15/2019    Priority: 2.   Acute on chronic respiratory failure with hypoxia and hypercapnia (HCC) 09/13/2023    Priority: 3.   AKI (acute kidney injury) (HCC) 09/13/2023    Priority: 3.   Hypernatremia 09/13/2023    Priority: 3.   Severe thrombocytopenia (HCC) 11/13/2017    Priority: 3.   Liver cirrhosis secondary to NASH [facility])     Priority: 4.   Paroxysmal atrial fibrillation (HCC) 01/24/2018    Priority: 7.   Inadequate oral intake 09/13/2023    Priority: 8.   Chronic anticoagulation 09/14/2023   Hypokalemia 09/13/2023   At high risk for bleeding 09/12/2023   Pressure injury of skin 09/09/2023   Generalized weakness 08/25/2023   Dog bite 08/25/2023   Moderate persistent asthma 08/25/2023   DM2 (diabetes mellitus, type 2) (HCC) 03/14/2023   History of CVA (cerebrovascular accident) 04/13/2022   HLD (hyperlipidemia)    Sjogren's syndrome (HCC)    OSA (obstructive sleep apnea)     Resolved Hospital Problems  No resolved problems to display.     Discharge Instructions     No wound care   Complete by: As directed       Allergies as of 09/14/2023       Reactions   Prednisone     Other Reaction(s): delerium   Doxycycline Hives, Rash   Naproxen Rash, Hives        Medication List     STOP taking these medications    albuterol  (2.5 MG/3ML) 0.083% nebulizer solution Commonly known as: PROVENTIL    albuterol  108 (90 Base) MCG/ACT inhaler Commonly known as: VENTOLIN  HFA   amoxicillin -clavulanate 875-125 MG tablet Commonly known as: AUGMENTIN    Eliquis  5 MG Tabs tablet Generic drug: apixaban    ezetimibe  10 MG tablet Commonly known as: ZETIA    Farxiga  10 MG Tabs tablet Generic drug: dapagliflozin propanediol    furosemide  20 MG tablet Commonly known as: Lasix    gabapentin  100 MG capsule Commonly known as: Neurontin     hydroxychloroquine  200 MG tablet Commonly known as: PLAQUENIL    MAGNESIUM  GLUCONATE PO   nadolol  40 MG tablet Commonly known as: CORGARD    oxyCODONE -acetaminophen  5-325 MG tablet Commonly known as: PERCOCET/ROXICET   OXYGEN    Ozempic (1 MG/DOSE) 4 MG/3ML Sopn Generic drug: Semaglutide (1 MG/DOSE)   potassium chloride  SA 20 MEQ tablet Commonly known as: KLOR-CON  M   Repatha  SureClick 140 MG/ML Soaj Generic drug: Evolocumab    Stiolto Respimat  2.5-2.5 MCG/ACT Aers Generic drug: Tiotropium Bromide-Olodaterol   tadalafil  (PAH) 20 MG tablet Commonly known as: ADCIRCA                Durable Medical Equipment  (From admission, onward)           Start     Ordered   09/06/23 1636  For home use only DME Walker rolling  Once       Question Answer Comment  Walker: With 5 Inch Wheels   Patient needs a walker to treat with the following condition Physical deconditioning   Patient needs a walker  to treat with the following condition Heart failure [facility])   Patient needs a walker to treat with the following condition Pulmonary hypertension (HCC)      09/06/23 1636            Follow-up Information     [facility], [facility] Follow up.   Specialty: Home Health Services Why: Gasper Karst will contact you for the first home visit Contact information: [address] [place] [address] [phone]                Allergies  Allergen Reactions   Prednisone      Other Reaction(s): delerium   Doxycycline Hives and Rash   Naproxen Rash and Hives    Consultations: AHF  Procedures:   Discharge Exam: BP 110/86 (BP Location: Left Arm)   Pulse (!) 129   Temp 97.9 F (36.6 C) (Oral)   Resp 20   Ht 5' 3.75" (1.619 m)   Wt 100.9 kg   SpO2 93%   BMI 38.48 kg/m  Physical Exam Constitutional:      General: She is not in acute distress.    Appearance: Normal appearance.  HENT:     Head: Normocephalic and atraumatic.     Mouth/Throat:      Mouth: Mucous membranes are moist.  Eyes:     Extraocular Movements: Extraocular movements intact.  Cardiovascular:     Rate and Rhythm: Tachycardia present. Rhythm irregular.  Pulmonary:     Effort: Pulmonary effort is normal. No respiratory distress.     Breath sounds: Normal breath sounds. No wheezing.  Abdominal:     General: Bowel sounds are normal. There is no distension.     Palpations: Abdomen is soft.     Tenderness: There is no abdominal tenderness.  Musculoskeletal:        General: Normal range of motion.     Cervical back: Normal range of motion and neck supple.  Skin:    General: Skin is warm and dry.  Neurological:     Mental Status: She is alert. She is disoriented.     Comments: Sleeping and remains confused  Psychiatric:        Mood and Affect: Mood normal.      The results of significant diagnostics from this hospitalization (including imaging, microbiology, ancillary and laboratory) are listed below for reference.   Microbiology: No results found for this or any previous visit (from the past 240 hours).   Labs: BNP (last 3 results) Recent Labs    09/03/23 0320 09/04/23 0322 09/05/23 0419  BNP 2,106.9* 2,130.8* 1,540.4*   Basic Metabolic Panel: Recent Labs  Lab 09/08/23 6578 09/08/23 1722 09/10/23 4696 09/11/23 0555 09/11/23 0725 09/12/23 0430 09/12/23 2140 09/13/23 0300 09/13/23 0909 09/14/23 0500  NA 141   < > 150* 147*   < > 143 140 140 141 139  K 4.1   < > 4.1 6.7*   < > 2.8* 3.5 3.2* 3.3* 3.3*  CL 99   < > 104 106   < > 100 96* 94* 93* 91*  CO2 33*   < > 34* 36*   < > 36* 36* 37* 39* 38*  GLUCOSE 84   < > 125* 109*   < > 195* 237* 202* 188* 235*  BUN 53*   < > 49* 46*   < > 50* 57* 56* 54* 60*  CREATININE 1.87*   < > 1.84* 1.83*   < > 1.77* 2.08* 1.92* 2.03* 2.15*  CALCIUM  9.3   < > 10.4* 9.4   < > 9.3 8.9 9.0 8.9 8.6*  MG 2.5*   < > 2.4 2.0  --  2.0 2.1  --  2.2 2.2  PHOS 3.4  --  3.7  --   --  3.1  --   --   --   --    < > = values  in this interval not displayed.   Liver Function Tests: Recent Labs  Lab 09/09/23 0500 09/12/23 0430 09/14/23 0500  AST 70* 59* 49*  ALT 17 17 13   ALKPHOS 72 81 79  BILITOT 2.3* 2.3* 1.9*  PROT 6.2* 6.0* 5.6*  ALBUMIN  2.8* 2.6* 2.5*   No results for input(s): "LIPASE", "AMYLASE" in the last 168 hours. Recent Labs  Lab 09/09/23 0500 09/10/23 0436 09/11/23 0801 09/13/23 1330 09/14/23 1130  AMMONIA 133* 71* 38* 185* 75*   CBC: Recent Labs  Lab 09/09/23 0500 09/10/23 0436 09/11/23 0555 09/11/23 0818 09/12/23 0430 09/14/23 0500  WBC 6.3 7.4 7.3  --  7.3 6.8  NEUTROABS  --   --  4.3  --  4.5 4.2  HGB 14.1 13.9 13.2 13.9 13.5 12.7  HCT 45.2 44.6 43.1 41.0 43.6 42.2  MCV 96.8 97.2 99.1  --  97.3 100.0  PLT 51* 51* 46*  --  38* 32*   Cardiac Enzymes: No results for input(s): "CKTOTAL", "CKMB", "CKMBINDEX", "TROPONINI" in the last 168 hours. BNP: Invalid input(s): "POCBNP" CBG: Recent Labs  Lab 09/13/23 1140 09/13/23 1640 09/13/23 2123 09/14/23 0614 09/14/23 1139  GLUCAP 138* 121* 193* 148* 166*   D-Dimer No results for input(s): "DDIMER" in the last 72 hours. Hgb A1c No results for input(s): "HGBA1C" in the last 72 hours. Lipid Profile No results for input(s): "CHOL", "HDL", "LDLCALC", "TRIG", "CHOLHDL", "LDLDIRECT" in the last 72 hours. Thyroid  function studies No results for input(s): "TSH", "T4TOTAL", "T3FREE", "THYROIDAB" in the last 72 hours.  Invalid input(s): "FREET3" Anemia work up No results for input(s): "VITAMINB12", "FOLATE", "FERRITIN", "TIBC", "IRON", "RETICCTPCT" in the last 72 hours. Urinalysis    Component Value Date/Time   COLORURINE STRAW (A) 09/02/2023 1336   APPEARANCEUR CLEAR 09/02/2023 1336   LABSPEC 1.006 09/02/2023 1336   PHURINE 5.0 09/02/2023 1336   GLUCOSEU NEGATIVE 09/02/2023 1336   HGBUR SMALL (A) 09/02/2023 1336   BILIRUBINUR NEGATIVE 09/02/2023 1336   KETONESUR NEGATIVE 09/02/2023 1336   PROTEINUR NEGATIVE 09/02/2023  1336   UROBILINOGEN 1.0 02/15/2015 0837   NITRITE NEGATIVE 09/02/2023 1336   LEUKOCYTESUR TRACE (A) 09/02/2023 1336   Sepsis Labs Recent Labs  Lab 09/10/23 0436 09/11/23 0555 09/12/23 0430 09/14/23 0500  WBC 7.4 7.3 7.3 6.8   Microbiology No results found for this or any previous visit (from the past 240 hours).  Procedures/Studies: DG Abd Portable 1V Result Date: 09/08/2023 CLINICAL DATA:  NG tube placement EXAM: PORTABLE ABDOMEN - 1 VIEW COMPARISON:  01/18/2015 FINDINGS: NG tube coils in the stomach. Mild gaseous distention of the visualized bowel in the upper abdomen. IMPRESSION: NG tube coils in the stomach. Electronically Signed   By: Janeece Mechanic M.D.   On: 09/08/2023 18:15   DG Chest Port 1 View Result Date: 09/08/2023 CLINICAL DATA:  [id number] Pulmonary edema [id number] EXAM: PORTABLE CHEST - 1 VIEW COMPARISON:  09/07/2023. FINDINGS: Cardiac silhouette is prominent. There is pulmonary interstitial prominence with vascular congestion. No focal consolidation. No pneumothorax or pleural effusion identified. Aorta is calcified. There are median sternotomy wires. Prosthetic aortic valve. Implanted  cardiac monitor. Right-sided PICC tip mid SVC. IMPRESSION: Findings suggest CHF. Electronically Signed   By: Sydell Eva M.D.   On: 09/08/2023 08:52   DG Chest Port 1 View Result Date: 09/07/2023 CLINICAL DATA:  Pulmonary edema. EXAM: PORTABLE CHEST 1 VIEW COMPARISON:  One-view chest x-ray 09/03/2023 FINDINGS: The heart is enlarged. Aortic valve replacement is noted. Number cord noted. Right PICC line terminates just above the cavoatrial junction. Lung volumes are low. Increasing interstitial edema and bilateral pleural effusions is noted. Bibasilar airspace opacities likely reflect atelectasis. IMPRESSION: 1. Increasing interstitial edema and bilateral pleural effusions compatible with progressive congestive heart failure. 2. Bibasilar airspace opacities likely reflect atelectasis. Electronically  Signed   By: Audree Leas M.D.   On: 09/07/2023 12:00   DG CHEST PORT 1 VIEW Result Date: 09/03/2023 CLINICAL DATA:  Respiratory failure EXAM: PORTABLE CHEST 1 VIEW COMPARISON:  09/02/2023 FINDINGS: Cardiac shadow is enlarged but stable. Postsurgical changes are noted. Right PICC is now seen in satisfactory position. The lungs are well aerated bilaterally. Prominent central vascular congestion is noted. Mild right basilar atelectasis is noted. IMPRESSION: Mild vascular congestion with right basilar atelectasis. Electronically Signed   By: Violeta Grey M.D.   On: 09/03/2023 08:29   DG CHEST PORT 1 VIEW Result Date: 09/02/2023 CLINICAL DATA:  Hypoxia. EXAM: PORTABLE CHEST 1 VIEW COMPARISON:  Chest radiograph dated 08/31/2023 FINDINGS: There is cardiomegaly with vascular congestion. Small bilateral pleural effusions and bibasilar atelectasis or infiltrate, progressed since the prior radiograph. No pneumothorax. Median sternotomy wires. No acute osseous pathology. IMPRESSION: 1. Cardiomegaly with vascular congestion. 2. Small bilateral pleural effusions and bibasilar atelectasis or infiltrate. Electronically Signed   By: Angus Bark M.D.   On: 09/02/2023 11:48   US  EKG SITE RITE Result Date: 09/01/2023 If Site Rite image not attached, placement could not be confirmed due to current cardiac rhythm.  ECHOCARDIOGRAM LIMITED Result Date: 09/01/2023    ECHOCARDIOGRAM LIMITED REPORT   Patient Name:   SHAYANA COLO Pompa Date of Exam: 09/01/2023 Medical Rec #:  [id number]      Height:       63.7 in Accession #:    [id number]     Weight:       244.9 lb Date of Birth:  1956-03-26       BSA:          2.126 m Patient Age:    68 years       BP:           88/56 mmHg Patient Gender: F              HR:           58 bpm. Exam Location:  Inpatient Procedure: Limited Echo, Cardiac Doppler and Color Doppler (Both Spectral and            Color Flow Doppler were utilized during procedure). Indications:   PHTN I27.2   History:       Patient has prior history of Echocardiogram examinations.                Aortic Valve: bioprosthetic valve is present in the aortic                position.  Sonographer:   Hersey Lorenzo RDCS Referring      [phone] [facility] Phys: IMPRESSIONS  1. Left ventricular ejection fraction, by estimation, is >75%. The left ventricle has hyperdynamic function.  2. The right ventricular size is moderately enlarged. There  is moderately elevated pulmonary artery systolic pressure.  3. Left atrial size was severely dilated.  4. Severe mitral annular calcification.  5. The aortic valve has been repaired/replaced. There is moderate calcification of the aortic valve. There is moderate thickening of the aortic valve. Aortic valve sclerosis is present, with no evidence of aortic valve stenosis. There is a bioprosthetic  valve present in the aortic position.  6. The inferior vena cava is dilated in size with <50% respiratory variability, suggesting right atrial pressure of 15 mmHg. FINDINGS  Left Ventricle: Left ventricular ejection fraction, by estimation, is >75%. The left ventricle has hyperdynamic function. Right Ventricle: The right ventricular size is moderately enlarged. There is moderately elevated pulmonary artery systolic pressure. The tricuspid regurgitant velocity is 3.02 m/s, and with an assumed right atrial pressure of 15 mmHg, the estimated right  ventricular systolic pressure is 51.5 mmHg. Left Atrium: Left atrial size was severely dilated. Mitral Valve: Severe mitral annular calcification. Tricuspid Valve: The tricuspid valve is normal in structure. Tricuspid valve regurgitation is mild. Aortic Valve: The aortic valve has been repaired/replaced. There is moderate calcification of the aortic valve. There is moderate thickening of the aortic valve. Aortic valve sclerosis is present, with no evidence of aortic valve stenosis. There is a bioprosthetic valve present in the aortic position. Venous: The  inferior vena cava is dilated in size with less than 50% respiratory variability, suggesting right atrial pressure of 15 mmHg. LEFT VENTRICLE PLAX 2D LVIDd:         5.20 cm LVIDs:         2.10 cm LV PW:         1.00 cm LV IVS:        1.00 cm  RIGHT VENTRICLE             IVC RV S prime:     10.60 cm/s  IVC diam: 2.40 cm TAPSE (M-mode): 2.5 cm LEFT ATRIUM         Index       RIGHT ATRIUM           Index LA diam:    5.10 cm 2.40 cm/m  RA Area:     19.20 cm                                 RA Volume:   57.00 ml  26.81 ml/m  AORTIC VALVE LVOT Vmax:   116.00 cm/s LVOT Vmean:  77.600 cm/s LVOT VTI:    0.366 m MITRAL VALVE                TRICUSPID VALVE MV Area (PHT): 1.94 cm     TR Peak grad:   36.5 mmHg MV Decel Time: 392 msec     TR Vmax:        302.00 cm/s MV E velocity: 157.00 cm/s MV A velocity: 106.00 cm/s  SHUNTS MV E/A ratio:  1.48         Systemic VTI: 0.37 m Dorothye Gathers MD Electronically signed by Dorothye Gathers MD Signature Date/Time: 09/01/2023/2:15:19 PM    Final    DG CHEST PORT 1 VIEW Result Date: 08/31/2023 CLINICAL DATA:  Dyspnea EXAM: PORTABLE CHEST 1 VIEW COMPARISON:  08/25/2023 FINDINGS: Pulmonary insufflation is normal and symmetric. Mild left basilar atelectasis or infiltrate. No pneumothorax or pleural effusion. Status post aortic valve replacement. Stable cardiomegaly. Implanted loop recorder again noted. Central pulmonary arteries are enlarged.  No overt pulmonary edema. No acute bone abnormality. IMPRESSION: 1. Mild left basilar atelectasis or infiltrate. 2. Stable cardiomegaly. Electronically Signed   By: Worthy Heads M.D.   On: 08/31/2023 21:28   ECHOCARDIOGRAM COMPLETE BUBBLE STUDY Result Date: 08/27/2023    ECHOCARDIOGRAM REPORT   Patient Name:   BRAYANNA RIDDLE [place]baugh Date of Exam: 08/27/2023 Medical Rec #:  [id number]      Height:       63.7 in Accession #:    [id number]     Weight:       249.8 lb Date of Birth:  1956/03/20       BSA:          2.143 m Patient Age:    68 years       BP:            145/86 mmHg Patient Gender: F              HR:           71 bpm. Exam Location:  Inpatient Procedure: 2D Echo, Cardiac Doppler, Color Doppler and Saline Contrast Bubble            Study (Both Spectral and Color Flow Doppler were utilized during            procedure). Indications:    Stroke  History:        Patient has prior history of Echocardiogram examinations, most                 recent 03/14/2023. COPD, Signs/Symptoms:Altered Mental Status and                 Dyspnea; Risk Factors:Sleep Apnea, Diabetes and Dyslipidemia.  Sonographer:    Aldon Ambrosia Referring Phys: [id number] PROSPER M AMPONSAH IMPRESSIONS  1. Left ventricular ejection fraction, by estimation, is >75%. The left ventricle has hyperdynamic function. The left ventricle has no regional wall motion abnormalities. Left ventricular diastolic function could not be evaluated due to mitral annular calcification (moderate or greater) but hemodynamics suggestive of Grade III diastolic dysfunction. Elevated left atrial pressure. The E/e' is 17.  2. Right ventricular systolic function is normal. The right ventricular size is mildly enlarged. There is severely elevated pulmonary artery systolic pressure. The estimated right ventricular systolic pressure is 73.1 mmHg.  3. Left atrial size was severely dilated.  4. Severe mitral subvalvular calcification.  5. The mitral valve is degenerative. Moderate mitral valve regurgitation. Mild mitral stenosis. The mean mitral valve gradient is 5.0 mmHg with average heart rate of 70 bpm. Severe mitral annular calcification.  6. Tricuspid valve regurgitation is severe.  7. S/P 21 mm Edwards Inspiris Resilia valve present in the aortic position. Procedure Date: 12/05/17. No valvular regurgitation. No valvular stenosis (peak velocity 2.62m/s, MG 13.70mmHG, DI 0.47, EOA 1.33, triangular jet contour, AT ). No PVL.  8. The inferior vena cava is dilated in size with <50% respiratory variability, suggesting right atrial pressure  of 15 mmHg.  9. Agitated saline contrast bubble study was negative, with no evidence of any interatrial shunt. Conclusion(s)/Recommendation(s): No intracardiac source of embolism detected on this transthoracic study. Consider a transesophageal echocardiogram to exclude cardiac source of embolism if clinically indicated. FINDINGS  Left Ventricle: Left ventricular ejection fraction, by estimation, is >75%. The left ventricle has hyperdynamic function. The left ventricle has no regional wall motion abnormalities. The left ventricular internal cavity size was normal in size. There is borderline left ventricular hypertrophy. Left ventricular diastolic function could not  be evaluated due to mitral annular calcification (moderate or greater) but hemodynamics suggestive of Grade III diastolic dysfunction. Elevated left atrial pressure. The E/e' is 19. Right Ventricle: The right ventricular size is mildly enlarged. Right vetricular wall thickness was not well visualized. Right ventricular systolic function is normal. There is severely elevated pulmonary artery systolic pressure. The tricuspid regurgitant velocity is 3.81 m/s, and with an assumed right atrial pressure of 15 mmHg, the estimated right ventricular systolic pressure is 73.1 mmHg. Left Atrium: Left atrial size was severely dilated. Right Atrium: Right atrial size was normal in size. Pericardium: There is no evidence of pericardial effusion. Mitral Valve: The mitral valve is degenerative in appearance. Severe mitral annular calcification. Severe mitral subvalvular calcification. Moderate mitral valve regurgitation. Mild mitral valve stenosis. MV peak gradient, 15.2 mmHg. The mean mitral valve gradient is 5.0 mmHg with average heart rate of 70 bpm. Tricuspid Valve: The tricuspid valve is normal in structure. Tricuspid valve regurgitation is severe. No evidence of tricuspid stenosis. Aortic Valve: S/P 21 mm Edwards Inspiris Resilia valve present in the aortic  position. Procedure Date: 12/05/17. No valvular regurgitation. No valvular stenosis (peak velocity 2.76m/s, MG 13.58mmHG, DI 0.47, EOA 1.33, triangular jet contour, AT ). No PVL. Aortic valve mean gradient measures 13.8 mmHg. Aortic valve peak gradient measures 24.6 mmHg. Aortic valve area, by VTI measures 1.33 cm. Pulmonic Valve: The pulmonic valve was grossly normal. Pulmonic valve regurgitation is mild. No evidence of pulmonic stenosis. Aorta: The aortic root and ascending aorta are structurally normal, with no evidence of dilitation. Venous: The inferior vena cava is dilated in size with less than 50% respiratory variability, suggesting right atrial pressure of 15 mmHg. IAS/Shunts: The atrial septum is grossly normal. Agitated saline contrast was given intravenously to evaluate for intracardiac shunting. Agitated saline contrast bubble study was negative, with no evidence of any interatrial shunt.  LEFT VENTRICLE PLAX 2D LVIDd:         4.20 cm      Diastology LVIDs:         2.80 cm      LV e' medial:    7.18 cm/s LV PW:         1.00 cm      LV E/e' medial:  20.8 LV IVS:        1.10 cm      LV e' lateral:   10.60 cm/s LVOT diam:     1.90 cm      LV E/e' lateral: 14.1 LV SV:         73 LV SV Index:   34 LVOT Area:     2.84 cm  LV Volumes (MOD) LV vol d, MOD A2C: 163.0 ml LV vol d, MOD A4C: 114.0 ml LV vol s, MOD A2C: 50.4 ml LV vol s, MOD A4C: 45.7 ml LV SV MOD A2C:     112.6 ml LV SV MOD A4C:     114.0 ml LV SV MOD BP:      88.2 ml RIGHT VENTRICLE             IVC RV Basal diam:  4.70 cm     IVC diam: 2.10 cm RV Mid diam:    3.70 cm RV S prime:     10.00 cm/s TAPSE (M-mode): 1.3 cm LEFT ATRIUM              Index        RIGHT ATRIUM  Index LA diam:        4.50 cm  2.10 cm/m   RA Area:     17.70 cm LA Vol (A2C):   133.0 ml 62.05 ml/m  RA Volume:   48.80 ml  22.77 ml/m LA Vol (A4C):   112.0 ml 52.25 ml/m LA Biplane Vol: 128.0 ml 59.72 ml/m  AORTIC VALVE                     PULMONIC VALVE AV Area  (Vmax):    1.25 cm      PV Vmax:          1.12 m/s AV Area (Vmean):   1.37 cm      PV Peak grad:     5.1 mmHg AV Area (VTI):     1.33 cm      PR End Diast Vel: 10.50 msec AV Vmax:           248.00 cm/s AV Vmean:          170.000 cm/s AV VTI:            0.552 m AV Peak Grad:      24.6 mmHg AV Mean Grad:      13.8 mmHg LVOT Vmax:         109.00 cm/s LVOT Vmean:        81.900 cm/s LVOT VTI:          0.258 m LVOT/AV VTI ratio: 0.47  AORTA Ao Root diam: 3.60 cm Ao Asc diam:  3.60 cm MITRAL VALVE                TRICUSPID VALVE MV Area (PHT): 3.95 cm     TR Peak grad:   58.1 mmHg MV Area VTI:   1.52 cm     TR Vmax:        381.00 cm/s MV Peak grad:  15.2 mmHg MV Mean grad:  5.0 mmHg     SHUNTS MV Vmax:       1.95 m/s     Systemic VTI:  0.26 m MV Vmean:      104.0 cm/s   Systemic Diam: 1.90 cm MV Decel Time: 192 msec MR Peak grad: 145.4 mmHg MR Mean grad: 86.0 mmHg MR Vmax:      603.00 cm/s MR Vmean:     429.0 cm/s MV E velocity: 149.00 cm/s MV A velocity: 64.90 cm/s MV E/A ratio:  2.30 Sunit Tolia Electronically signed by Olinda Bertrand Signature Date/Time: 08/27/2023/9:07:17 PM    Final    Overnight EEG with video Result Date: 08/27/2023 Arleene Lack, MD     08/28/2023 11:05 AM Patient Name: ADIAH DRAGOTTA MRN: [id number] Epilepsy Attending: Arleene Lack Referring Physician/Provider: Eleni Griffin, MD Duration: 08/27/2023 0353 to 08/28/2023 0353 Patient history: 68 y.o. female with a history of Sjogren syndrome, diabetes, fibromyalgia, depression, GERD, aortic valve replacement, A-fib on Eliquis , TIA, NASH cirrhosis, pulmonary hypertension, thrombocytopenia and home O2 use and recent dog bite who presented with altered mental status.  EEG to evaluate for seizure Level of alertness: Awake, asleep AEDs during EEG study: None Technical aspects: This EEG study was done with scalp electrodes positioned according to the 10-20 International system of electrode placement. Electrical activity was reviewed with band pass  filter of 1-70Hz , sensitivity of 7 uV/mm, display speed of 11mm/sec with a 60Hz  notched filter applied as appropriate. EEG data were recorded continuously and digitally stored.  Video monitoring was available  and reviewed as appropriate. Description: No clear posterior dominant rhythm was seen. Sleep was characterized by vertex waves, maximal frontocentral region. EEG showed continuous generalized predominantly 5 to 7 Hz theta slowing admixed with intermittent 2-3hz  delta slowing. Hyperventilation and photic stimulation were not performed.    ABNORMALITY - Continuous slow, generalized  IMPRESSION: This study is suggestive of moderate diffuse encephalopathy. No seizures or epileptiform discharges were seen throughout the recording.  Arleene Lack   EEG adult Result Date: 08/26/2023 Arleene Lack, MD     08/26/2023 12:30 PM Patient Name: EMMILY LEIBENSPERGER MRN: [id number] Epilepsy Attending: Arleene Lack Referring Physician/Provider: Colon Dear, NP Date: 08/26/2023 Duration: 23.39 mins Patient history: 68yo F with ams. EEG to evaluate for seizure Level of alertness: Awake, asleep AEDs during EEG study: None Technical aspects: This EEG study was done with scalp electrodes positioned according to the 10-20 International system of electrode placement. Electrical activity was reviewed with band pass filter of 1-70Hz , sensitivity of 7 uV/mm, display speed of 13mm/sec with a 60Hz  notched filter applied as appropriate. EEG data were recorded continuously and digitally stored.  Video monitoring was available and reviewed as appropriate. Description: The posterior dominant rhythm consists of 7 Hz activity of moderate voltage (25-35 uV) seen predominantly in posterior head regions, symmetric and reactive to eye opening and eye closing. Sleep was characterized by sleep spindles (12 to 14 Hz), maximal frontocentral region. EEG showed continuous generalized 6 to 7 Hz theta slowing. Hyperventilation and photic  stimulation were not performed.   ABNORMALITY - Continuous slow, generalized IMPRESSION: This study is suggestive of mild to moderate diffuse encephalopathy. No seizures or epileptiform discharges were seen throughout the recording. Priyanka Suzanne Erps   CT ANGIO HEAD NECK W WO CM Result Date: 08/26/2023 CLINICAL DATA:  68 year old female with code stroke presentation. Small right superior frontal subcortical white matter infarct on MRI this morning. EXAM: CT ANGIOGRAPHY HEAD AND NECK WITH AND WITHOUT CONTRAST TECHNIQUE: Multidetector CT imaging of the head and neck was performed using the standard protocol during bolus administration of intravenous contrast. Multiplanar CT image reconstructions and MIPs were obtained to evaluate the vascular anatomy. Carotid stenosis measurements (when applicable) are obtained utilizing NASCET criteria, using the distal internal carotid diameter as the denominator. RADIATION DOSE REDUCTION: This exam was performed according to the departmental dose-optimization program which includes automated exposure control, adjustment of the mA and/or kV according to patient size and/or use of iterative reconstruction technique. CONTRAST:  75mL OMNIPAQUE  IOHEXOL  350 MG/ML SOLN COMPARISON:  Brain MRI today reported separately. CTA head and neck 04/14/2022. FINDINGS: CTA NECK Skeleton: Sternotomy.  No acute osseous abnormality identified. Upper chest: CABG.  Upper lung atelectasis. Other neck: Retropharyngeal course of the carotids. Redemonstrated asymmetrically atrophied, heterogeneously calcified parotid glands and submandibular glands. Other nonvascular neck soft tissue spaces are within normal limits. Aortic arch: Calcified aortic atherosclerosis. 3 vessel arch configuration. Right carotid system: Tortuous brachiocephalic artery and right CCA with no significant plaque or stenosis. Mild calcified plaque at the right ICA origin. Tortuous right ICA without stenosis. Left carotid system: Similar  tortuosity and mild calcified plaque without stenosis. Vertebral arteries: Tortuous proximal right subclavian origin with minimal plaque. Normal right vertebral artery origin. Mildly tortuous and non dominant right vertebral artery is patent to the skull base with no significant plaque or stenosis. Calcified proximal left subclavian artery plaque without stenosis. Normal left vertebral artery origin. Tortuous left V1 segment. Left vertebral artery is mildly dominant and tortuous  to the skull base with no significant plaque or stenosis. CTA HEAD Posterior circulation: Distal vertebral arteries, vertebrobasilar junction, left PICA origin and dominant appearing right AICA are patent with no significant plaque or stenosis. Patent basilar artery without stenosis. Patent SCA origins. Fetal type bilateral PCA origins again noted. Bilateral PCA branches appear stable and within normal limits. Anterior circulation: Both ICA siphons are patent. Mild siphon tortuosity. Mild left siphon calcified plaque without stenosis. Similar right siphon tortuosity and mild calcified plaque without stenosis. Normal posterior communicating artery origins. Patent carotid termini. Normal MCA and ACA origins. Anterior communicating artery and bilateral ACA branches are within normal limits. MCA M1 segments, bi/trifurcations are patent without stenosis. Bilateral MCA branches are stable and within normal limits. Venous sinuses: Patent. Anatomic variants: Fetal type PCA origins. Mildly dominant left vertebral artery. Review of the MIP images confirms the above findings IMPRESSION: 1. Negative large vessel occlusion. Generalized arterial tortuosity in the head and neck with mild for age atherosclerosis and no hemodynamically significant arterial stenosis. 2.  Aortic Atherosclerosis (ICD10-I70.0).  CABG. 3. Chronic salivary gland atrophy, sialolithiasis. Electronically Signed   By: Marlise Simpers M.D.   On: 08/26/2023 10:04   MR BRAIN WO  CONTRAST Result Date: 08/26/2023 CLINICAL DATA:  Neuro deficit, acute, stroke suspected. Altered mental status. EXAM: MRI HEAD WITHOUT CONTRAST TECHNIQUE: Multiplanar, multiecho pulse sequences of the brain and surrounding structures were obtained without intravenous contrast. COMPARISON:  Head CT 08/25/2023 and MRI 03/17/2023 FINDINGS: The study is intermittently up to moderately motion degraded. Brain: There is a 1 cm acute infarct in the subcortical white matter of the posterior right frontal lobe. A few chronic cerebral microhemorrhages are nonspecific. Small T2 hyperintensities in the cerebral white matter nonspecific but compatible with mild chronic small vessel ischemic disease. There are small chronic infarcts involving the left caudate nucleus and cerebellum. A partially empty sella is unchanged. Cerebral volume is within normal limits for age. The ventricles are normal in size. No mass, midline shift, or extra-axial fluid collection is identified. Vascular: Major intracranial vascular flow voids are preserved. Skull and upper cervical spine: Unremarkable bone marrow signal. Sinuses/Orbits: Unremarkable orbits. Mild mucosal thickening in the paranasal sinuses. Trace right mastoid fluid. Other: None. IMPRESSION: 1. Small acute posterior right frontal white matter infarct. 2. Mild chronic small vessel ischemic disease. Electronically Signed   By: Aundra Lee M.D.   On: 08/26/2023 07:26   DG Chest Portable 1 View Result Date: 08/25/2023 CLINICAL DATA:  Altered mental status. EXAM: PORTABLE CHEST 1 VIEW COMPARISON:  Radiograph 08/24/2023 FINDINGS: Stable cardiomegaly. Loop recorder. AVR. Sternotomy. Aortic atherosclerotic calcification. Pulmonary vascular congestion. Prominent central pulmonary arteries. No focal consolidation, pleural effusion, or pneumothorax. IMPRESSION: Cardiomegaly with pulmonary vascular congestion. Electronically Signed   By: Rozell Cornet M.D.   On: 08/25/2023 19:36   CT HEAD  CODE STROKE WO CONTRAST Result Date: 08/25/2023 CLINICAL DATA:  Code stroke.  Dysphasia EXAM: CT HEAD WITHOUT CONTRAST TECHNIQUE: Contiguous axial images were obtained from the base of the skull through the vertex without intravenous contrast. RADIATION DOSE REDUCTION: This exam was performed according to the departmental dose-optimization program which includes automated exposure control, adjustment of the mA and/or kV according to patient size and/or use of iterative reconstruction technique. COMPARISON:  CT head without contrast 08/24/2023. MRI of the head without contrast 03/17/2023 FINDINGS: Brain: No acute infarct, hemorrhage, or mass lesion is present. Mild periventricular white matter changes are stable. A remote lacunar infarct is again noted in the left caudate  head. The deep brain nuclei are otherwise within normal limits. No acute or focal cortical abnormalities are present. The ventricles are of normal size. No significant extraaxial fluid collection is present. An enlarged relatively empty sella is present. Midline structures are otherwise unremarkable. Vascular: Atherosclerotic calcifications are present within the cavernous internal carotid arteries bilaterally. No hyperdense vessel is present. Skull: Calvarium is intact. No focal lytic or blastic lesions are present. No significant extracranial soft tissue lesion is present. Sinuses/Orbits: The paranasal sinuses and mastoid air cells are clear. The globes and orbits are within normal limits. Other: Diffuse fatty infiltration of the parotid glands is again noted bilaterally. Multiple punctate calcifications are present in both parotid glands. ASPECTS [facility] Stroke Program Early CT Score) - Ganglionic level infarction (caudate, lentiform nuclei, internal capsule, insula, M1-M3 cortex): 7/7 - Supraganglionic infarction (M4-M6 cortex): 3/3 Total score (0-10 with 10 being normal): 10/10 IMPRESSION: 1. No acute intracranial abnormality or significant  interval change. 2. Stable mild periventricular white matter disease. This likely reflects the sequela of chronic microvascular ischemia. 3. Remote lacunar infarct of the left caudate head. 4. Aspects is 10/10. 5. Diffuse fatty infiltration of the parotid glands bilaterally with multiple punctate calcifications. This suggests Sjogren disease The above was relayed via text pager to Dr. Cleone Dad on 08/25/2023 at 17:48 . Electronically Signed   By: Audree Leas M.D.   On: 08/25/2023 17:48   DG Chest 2 View Result Date: 08/24/2023 CLINICAL DATA:  Chest pain. EXAM: CHEST - 2 VIEW COMPARISON:  10/25/2022. FINDINGS: Heart is enlarged and the mediastinal contour is within normal limits. Left atrial appendage clip and prosthetic valve are noted. Lung volumes are low resulting in vascular crowding. No consolidation, effusion, or pneumothorax. Sternotomy wires are present over the midline. Degenerative changes are seen in the thoracic spine. IMPRESSION: No active cardiopulmonary disease. Electronically Signed   By: Wyvonnia Heimlich M.D.   On: 08/24/2023 16:03   CT Head Wo Contrast Result Date: 08/24/2023 CLINICAL DATA:  Mental status change, unknown cause. EXAM: CT HEAD WITHOUT CONTRAST TECHNIQUE: Contiguous axial images were obtained from the base of the skull through the vertex without intravenous contrast. RADIATION DOSE REDUCTION: This exam was performed according to the departmental dose-optimization program which includes automated exposure control, adjustment of the mA and/or kV according to patient size and/or use of iterative reconstruction technique. COMPARISON:  03/17/2023. FINDINGS: Brain: No acute intracranial hemorrhage, midline shift or mass effect. No extra-axial fluid collection. Mild periventricular white matter hypodensities are present bilaterally. No hydrocephalus. Vascular: No hyperdense vessel or unexpected calcification. Skull: Normal. Negative for fracture or focal lesion. Sinuses/Orbits: There  is partial opacification of the mastoid air cells bilaterally. Mucosal thickening is present in the maxillary sinuses. No acute orbital abnormality. Other: None. IMPRESSION: 1. No acute intracranial process. 2. Mild chronic microvascular ischemic changes. Electronically Signed   By: Wyvonnia Heimlich M.D.   On: 08/24/2023 15:59   DG Forearm Left Result Date: 08/20/2023 CLINICAL DATA:  Dog bite left forearm EXAM: LEFT FOREARM - 2 VIEW COMPARISON:  None Available. FINDINGS: Frontal and lateral views of the left forearm are obtained on 3 images. There is marked soft tissue edema throughout the dorsum of the forearm, greatest distally. No fracture or radiopaque foreign body. Alignment is anatomic. IMPRESSION: 1. Diffuse soft tissue swelling. No fracture or radiopaque foreign body. Electronically Signed   By: Bobbye Burrow M.D.   On: 08/20/2023 08:38     Time coordinating discharge: Over 30 minutes    Myrtie Atkinson  Rether Rison, MD  [facility] 09/14/2023, 4:46 PM

## 2023-09-14 NOTE — Assessment & Plan Note (Signed)
 Transfuse platelets for counts <10K or <50 K with active bleeding  Monitor for bleeding

## 2023-09-14 NOTE — Assessment & Plan Note (Signed)
 Hepatic encephalopathy with underlying cirrhosis

## 2023-09-14 NOTE — TOC Transition Note (Signed)
 Transition of Care [facility]) - Discharge Note   Patient Details  Name: Whitney [name] MRN: [id number] Date of Birth: 12-19-1955  [facility] CM/SW Contact:  Jeffory Mings, [place] Phone Number: 09/14/2023, 5:29 PM   Clinical Narrative: Per Peterson Brandt with [facility], pt has been approved for [facility] and they are prepared to admit today. Pt's family aware of dc and admission consents for [facility] have been completed. RN provided with number for report and PTAR arranged for transport. SW signing off at dc.   Paullette Boston, MSW, LCSW [phone] (coverage)        Final next level of care: Hospice Medical Facility Barriers to Discharge: Barriers Resolved   Patient Goals and CMS Choice Patient states their goals for this hospitalization and ongoing recovery are:: wants to get better CMS Medicare.gov Compare Post Acute Care list provided to:: Patient Represenative (must comment) Choice offered to / list presented to : Adult Children      Discharge Placement                Patient to be transferred to facility by: PTAR Name of family member notified: Nikki/Dtr Patient and family notified of of transfer: 09/14/23  Discharge Plan and Services Additional resources added to the After Visit Summary for     Discharge Planning Services: CM Consult Post Acute Care Choice: Home Health          DME Arranged: Otho Blitz rolling, NIV DME Agency: [employer] Date DME Agency Contacted: 09/06/23 Time DME Agency Contacted: [id number] Representative spoke with at DME Agency: Mitch HH Arranged: PT, OT, Nurse's Aide, RN HH Agency: [facility] Date [facility] Agency Contacted: 08/28/23   Representative spoke with at [facility] Agency: Randel Buss  Social Drivers of Health (SDOH) Interventions SDOH Screenings   Food Insecurity: No Food Insecurity (08/27/2023)  Housing: Low Risk  (09/03/2023)  Transportation Needs: No Transportation Needs (08/27/2023)  Utilities: Not At Risk  (08/27/2023)  Depression (PHQ2-9): Low Risk  (09/20/2020)  Recent Concern: Depression (PHQ2-9) - Medium Risk (07/11/2020)  Social Connections: Unknown (08/27/2023)  Tobacco Use: Low Risk  (08/25/2023)     Readmission Risk Interventions     No data to display

## 2023-09-15 ENCOUNTER — Other Ambulatory Visit (HOSPITAL_COMMUNITY): Payer: Self-pay | Admitting: Cardiovascular Disease

## 2023-09-24 ENCOUNTER — Telehealth (HOSPITAL_COMMUNITY): Payer: Self-pay | Admitting: Cardiology

## 2023-09-24 ENCOUNTER — Encounter (HOSPITAL_COMMUNITY): Admitting: Cardiology

## 2023-09-24 NOTE — Telephone Encounter (Signed)
 Patients daughter,  Gayle Kava , called to ask if her FMLA papers could be completed by Dr. Mitzie Anda. Patients daughter stated that before her mother passed she was being treated by Dr. Mitzie Anda and the facility who was treating her mothers needs to fill out her FMLA papers since she was taking care of her mother.   Patients daughter would like a call back at [phone].   [facility] [employer] Daughter Emergency Contact [phone]   Please advise.  Thank you.

## 2023-09-26 NOTE — Telephone Encounter (Signed)
 Spoke with daughter Landa Pine today and she said that her PCP filled out the paper work for her. I told her if her company need anymore information to just reach out to us  and we will be happy to assist her.

## 2023-09-28 DIAGNOSIS — J441 Chronic obstructive pulmonary disease with (acute) exacerbation: Secondary | ICD-10-CM | POA: Diagnosis not present

## 2023-10-06 DEATH — deceased

## 2023-10-15 ENCOUNTER — Ambulatory Visit: Admitting: Internal Medicine

## 2024-01-21 ENCOUNTER — Ambulatory Visit: Payer: Medicare PPO | Admitting: Adult Health
